# Patient Record
Sex: Male | Born: 1940
Health system: Southern US, Community
[De-identification: ages and names within clinical notes are randomized; demographics above are authoritative.]

## PROBLEM LIST (undated history)

## (undated) DIAGNOSIS — I4892 Unspecified atrial flutter: Secondary | ICD-10-CM

## (undated) DIAGNOSIS — I255 Ischemic cardiomyopathy: Secondary | ICD-10-CM

## (undated) DIAGNOSIS — I5042 Chronic combined systolic (congestive) and diastolic (congestive) heart failure: Secondary | ICD-10-CM

## (undated) DIAGNOSIS — I480 Paroxysmal atrial fibrillation: Secondary | ICD-10-CM

## (undated) DIAGNOSIS — I071 Rheumatic tricuspid insufficiency: Secondary | ICD-10-CM

## (undated) DIAGNOSIS — R0902 Hypoxemia: Secondary | ICD-10-CM

## (undated) DIAGNOSIS — I4891 Unspecified atrial fibrillation: Secondary | ICD-10-CM

## (undated) DIAGNOSIS — N183 Chronic kidney disease, stage 3 unspecified: Secondary | ICD-10-CM

## (undated) DIAGNOSIS — C7931 Secondary malignant neoplasm of brain: Secondary | ICD-10-CM

## (undated) DIAGNOSIS — I2510 Atherosclerotic heart disease of native coronary artery without angina pectoris: Secondary | ICD-10-CM

## (undated) DIAGNOSIS — R55 Syncope and collapse: Secondary | ICD-10-CM

## (undated) DIAGNOSIS — E785 Hyperlipidemia, unspecified: Secondary | ICD-10-CM

## (undated) DIAGNOSIS — I351 Nonrheumatic aortic (valve) insufficiency: Secondary | ICD-10-CM

## (undated) DIAGNOSIS — N529 Male erectile dysfunction, unspecified: Secondary | ICD-10-CM

## (undated) DIAGNOSIS — I10 Essential (primary) hypertension: Secondary | ICD-10-CM

## (undated) DIAGNOSIS — I219 Acute myocardial infarction, unspecified: Secondary | ICD-10-CM

## (undated) DIAGNOSIS — D649 Anemia, unspecified: Secondary | ICD-10-CM

## (undated) DIAGNOSIS — K635 Polyp of colon: Secondary | ICD-10-CM

## (undated) DIAGNOSIS — E871 Hypo-osmolality and hyponatremia: Secondary | ICD-10-CM

## (undated) DIAGNOSIS — Z9289 Personal history of other medical treatment: Secondary | ICD-10-CM

## (undated) DIAGNOSIS — I340 Nonrheumatic mitral (valve) insufficiency: Secondary | ICD-10-CM

## (undated) DIAGNOSIS — C3490 Malignant neoplasm of unspecified part of unspecified bronchus or lung: Secondary | ICD-10-CM

## (undated) DIAGNOSIS — N289 Disorder of kidney and ureter, unspecified: Secondary | ICD-10-CM

## (undated) HISTORY — DX: Personal history of other medical treatment: Z92.89

## (undated) HISTORY — DX: Paroxysmal atrial fibrillation: I48.0

## (undated) HISTORY — DX: Hypoxemia: R09.02

## (undated) HISTORY — DX: Hypo-osmolality and hyponatremia: E87.1

## (undated) HISTORY — DX: Male erectile dysfunction, unspecified: N52.9

## (undated) HISTORY — DX: Polyp of colon: K63.5

## (undated) HISTORY — PX: CORONARY STENT PLACEMENT: SHX1402

## (undated) HISTORY — DX: Essential (primary) hypertension: I10

## (undated) HISTORY — DX: Anemia, unspecified: D64.9

## (undated) HISTORY — DX: Secondary malignant neoplasm of brain: C79.31

## (undated) HISTORY — DX: Ischemic cardiomyopathy: I25.5

## (undated) HISTORY — DX: Nonrheumatic mitral (valve) insufficiency: I34.0

## (undated) HISTORY — PX: OTHER SURGICAL HISTORY: SHX169

## (undated) HISTORY — DX: Syncope and collapse: R55

## (undated) HISTORY — DX: Atherosclerotic heart disease of native coronary artery without angina pectoris: I25.10

## (undated) HISTORY — DX: Nonrheumatic aortic (valve) insufficiency: I35.1

## (undated) HISTORY — PX: WISDOM TOOTH EXTRACTION: SHX21

## (undated) HISTORY — DX: Unspecified atrial fibrillation: I48.91

## (undated) HISTORY — DX: Chronic combined systolic (congestive) and diastolic (congestive) heart failure: I50.42

## (undated) HISTORY — DX: Unspecified atrial flutter: I48.92

## (undated) HISTORY — DX: Hyperlipidemia, unspecified: E78.5

## (undated) HISTORY — DX: Rheumatic tricuspid insufficiency: I07.1

## (undated) HISTORY — DX: Chronic kidney disease, stage 3 unspecified: N18.30

## (undated) HISTORY — PX: COLONOSCOPY W/ POLYPECTOMY: SHX1380

---

## 2000-11-02 DIAGNOSIS — K635 Polyp of colon: Secondary | ICD-10-CM

## 2000-11-02 DIAGNOSIS — K621 Rectal polyp: Secondary | ICD-10-CM

## 2000-11-02 HISTORY — DX: Rectal polyp: K62.1

## 2000-11-02 HISTORY — PX: COLONOSCOPY W/ POLYPECTOMY: SHX1380

## 2000-11-02 HISTORY — DX: Polyp of colon: K63.5

## 2000-11-02 LAB — HM COLONOSCOPY

## 2001-07-12 ENCOUNTER — Other Ambulatory Visit: Admission: RE | Admit: 2001-07-12 | Discharge: 2001-07-12 | Payer: Self-pay | Admitting: Internal Medicine

## 2003-10-31 ENCOUNTER — Encounter: Admission: RE | Admit: 2003-10-31 | Discharge: 2003-10-31 | Payer: Self-pay | Admitting: Internal Medicine

## 2006-01-15 ENCOUNTER — Ambulatory Visit: Payer: Self-pay | Admitting: Internal Medicine

## 2006-01-19 ENCOUNTER — Ambulatory Visit: Payer: Self-pay | Admitting: Cardiology

## 2006-01-20 ENCOUNTER — Ambulatory Visit: Payer: Self-pay | Admitting: Internal Medicine

## 2006-01-20 ENCOUNTER — Inpatient Hospital Stay (HOSPITAL_BASED_OUTPATIENT_CLINIC_OR_DEPARTMENT_OTHER): Admission: RE | Admit: 2006-01-20 | Discharge: 2006-01-20 | Payer: Self-pay | Admitting: Internal Medicine

## 2006-01-20 ENCOUNTER — Ambulatory Visit: Payer: Self-pay | Admitting: Cardiology

## 2006-01-21 ENCOUNTER — Ambulatory Visit (HOSPITAL_COMMUNITY): Admission: RE | Admit: 2006-01-21 | Discharge: 2006-01-22 | Payer: Self-pay | Admitting: Cardiology

## 2006-01-21 ENCOUNTER — Ambulatory Visit: Payer: Self-pay | Admitting: Cardiology

## 2006-02-08 ENCOUNTER — Ambulatory Visit: Payer: Self-pay | Admitting: Cardiology

## 2006-02-10 ENCOUNTER — Ambulatory Visit: Payer: Self-pay | Admitting: Cardiology

## 2006-02-10 ENCOUNTER — Inpatient Hospital Stay (HOSPITAL_BASED_OUTPATIENT_CLINIC_OR_DEPARTMENT_OTHER): Admission: RE | Admit: 2006-02-10 | Discharge: 2006-02-10 | Payer: Self-pay | Admitting: Cardiology

## 2006-02-26 ENCOUNTER — Ambulatory Visit: Payer: Self-pay | Admitting: Cardiology

## 2006-03-26 ENCOUNTER — Ambulatory Visit: Payer: Self-pay | Admitting: Cardiology

## 2006-09-30 ENCOUNTER — Ambulatory Visit: Payer: Self-pay | Admitting: Cardiology

## 2006-12-16 ENCOUNTER — Ambulatory Visit: Payer: Self-pay | Admitting: Internal Medicine

## 2006-12-16 LAB — CONVERTED CEMR LAB
ALT: 23 units/L (ref 0–40)
AST: 20 units/L (ref 0–37)
Albumin: 4.1 g/dL (ref 3.5–5.2)
Alkaline Phosphatase: 59 units/L (ref 39–117)
Bilirubin, Direct: 0.1 mg/dL (ref 0.0–0.3)
Cholesterol: 184 mg/dL (ref 0–200)
HDL: 33.6 mg/dL — ABNORMAL LOW (ref >39.0)
LDL Cholesterol: 121 mg/dL — ABNORMAL HIGH (ref 0–99)
Total Bilirubin: 0.9 mg/dL (ref 0.3–1.2)
Total CHOL/HDL Ratio: 5.5
Total Protein: 7.1 g/dL (ref 6.0–8.3)
Triglycerides: 148 mg/dL (ref 0–149)
VLDL: 30 mg/dL (ref 0–40)

## 2007-01-07 ENCOUNTER — Ambulatory Visit: Payer: Self-pay | Admitting: Internal Medicine

## 2007-01-07 LAB — CONVERTED CEMR LAB
Creatinine,U: 66.8 mg/dL
Hgb A1c MFr Bld: 7.1 % — ABNORMAL HIGH (ref 4.6–6.0)
Microalb Creat Ratio: 4.5 mg/g (ref 0.0–30.0)
Microalb, Ur: 0.3 mg/dL (ref 0.0–1.9)

## 2007-01-24 ENCOUNTER — Encounter: Payer: Self-pay | Admitting: Internal Medicine

## 2007-02-03 ENCOUNTER — Encounter: Payer: Self-pay | Admitting: Internal Medicine

## 2007-02-03 ENCOUNTER — Ambulatory Visit: Payer: Self-pay | Admitting: Internal Medicine

## 2007-05-27 ENCOUNTER — Ambulatory Visit: Payer: Self-pay | Admitting: Internal Medicine

## 2007-05-27 LAB — CONVERTED CEMR LAB
ALT: 20 units/L (ref 0–53)
AST: 20 units/L (ref 0–37)
Cholesterol: 141 mg/dL (ref 0–200)
Creatinine,U: 89.7 mg/dL
HDL: 24.7 mg/dL — ABNORMAL LOW (ref >39.0)
Hgb A1c MFr Bld: 6.8 % — ABNORMAL HIGH (ref 4.6–6.0)
LDL Cholesterol: 83 mg/dL (ref 0–99)
Microalb Creat Ratio: 2.2 mg/g (ref 0.0–30.0)
Microalb, Ur: 0.2 mg/dL (ref 0.0–1.9)
Total CHOL/HDL Ratio: 5.7
Triglycerides: 168 mg/dL — ABNORMAL HIGH (ref 0–149)
VLDL: 34 mg/dL (ref 0–40)

## 2007-06-03 ENCOUNTER — Ambulatory Visit: Payer: Self-pay | Admitting: Internal Medicine

## 2007-06-03 LAB — CONVERTED CEMR LAB
Cholesterol, target level: 200 mg/dL
HDL goal, serum: 40 mg/dL
LDL Goal: 100 mg/dL

## 2007-07-16 ENCOUNTER — Emergency Department (HOSPITAL_COMMUNITY): Admission: EM | Admit: 2007-07-16 | Discharge: 2007-07-17 | Payer: Self-pay | Admitting: Emergency Medicine

## 2007-09-23 ENCOUNTER — Ambulatory Visit: Payer: Self-pay | Admitting: Internal Medicine

## 2007-09-24 LAB — CONVERTED CEMR LAB: Hgb A1c MFr Bld: 6.4 % — ABNORMAL HIGH (ref 4.6–6.0)

## 2007-09-26 ENCOUNTER — Encounter (INDEPENDENT_AMBULATORY_CARE_PROVIDER_SITE_OTHER): Payer: Self-pay | Admitting: *Deleted

## 2007-10-07 ENCOUNTER — Ambulatory Visit: Payer: Self-pay | Admitting: Internal Medicine

## 2007-10-07 DIAGNOSIS — F528 Other sexual dysfunction not due to a substance or known physiological condition: Secondary | ICD-10-CM | POA: Insufficient documentation

## 2007-10-07 DIAGNOSIS — E785 Hyperlipidemia, unspecified: Secondary | ICD-10-CM | POA: Insufficient documentation

## 2008-02-28 ENCOUNTER — Telehealth (INDEPENDENT_AMBULATORY_CARE_PROVIDER_SITE_OTHER): Payer: Self-pay | Admitting: *Deleted

## 2008-04-19 ENCOUNTER — Ambulatory Visit: Payer: Self-pay | Admitting: Internal Medicine

## 2008-04-19 LAB — CONVERTED CEMR LAB
ALT: 17 units/L (ref 0–53)
AST: 19 units/L (ref 0–37)
Albumin: 4 g/dL (ref 3.5–5.2)
Alkaline Phosphatase: 53 units/L (ref 39–117)
Bilirubin, Direct: 0.1 mg/dL (ref 0.0–0.3)
Cholesterol: 151 mg/dL (ref 0–200)
Creatinine,U: 83.2 mg/dL
HDL: 29.1 mg/dL — ABNORMAL LOW (ref >39.0)
Hgb A1c MFr Bld: 6.2 % — ABNORMAL HIGH (ref 4.6–6.0)
LDL Cholesterol: 91 mg/dL (ref 0–99)
Microalb Creat Ratio: 2.4 mg/g (ref 0.0–30.0)
Microalb, Ur: 0.2 mg/dL (ref 0.0–1.9)
Total Bilirubin: 0.8 mg/dL (ref 0.3–1.2)
Total CHOL/HDL Ratio: 5.2
Total Protein: 6.9 g/dL (ref 6.0–8.3)
Triglycerides: 154 mg/dL — ABNORMAL HIGH (ref 0–149)
VLDL: 31 mg/dL (ref 0–40)

## 2008-04-26 ENCOUNTER — Ambulatory Visit: Payer: Self-pay | Admitting: Internal Medicine

## 2008-04-26 DIAGNOSIS — D126 Benign neoplasm of colon, unspecified: Secondary | ICD-10-CM | POA: Insufficient documentation

## 2008-04-26 LAB — CONVERTED CEMR LAB: LDL Goal: 70 mg/dL

## 2008-05-03 ENCOUNTER — Encounter (INDEPENDENT_AMBULATORY_CARE_PROVIDER_SITE_OTHER): Payer: Self-pay | Admitting: *Deleted

## 2008-05-03 ENCOUNTER — Ambulatory Visit: Payer: Self-pay | Admitting: Internal Medicine

## 2008-05-03 LAB — CONVERTED CEMR LAB
OCCULT 1: NEGATIVE
OCCULT 2: NEGATIVE
OCCULT 3: NEGATIVE

## 2008-06-11 ENCOUNTER — Telehealth (INDEPENDENT_AMBULATORY_CARE_PROVIDER_SITE_OTHER): Payer: Self-pay | Admitting: *Deleted

## 2008-07-16 ENCOUNTER — Telehealth (INDEPENDENT_AMBULATORY_CARE_PROVIDER_SITE_OTHER): Payer: Self-pay | Admitting: *Deleted

## 2008-07-18 ENCOUNTER — Telehealth (INDEPENDENT_AMBULATORY_CARE_PROVIDER_SITE_OTHER): Payer: Self-pay | Admitting: *Deleted

## 2008-10-15 ENCOUNTER — Telehealth (INDEPENDENT_AMBULATORY_CARE_PROVIDER_SITE_OTHER): Payer: Self-pay | Admitting: *Deleted

## 2008-10-24 ENCOUNTER — Encounter (INDEPENDENT_AMBULATORY_CARE_PROVIDER_SITE_OTHER): Payer: Self-pay | Admitting: *Deleted

## 2008-11-19 ENCOUNTER — Telehealth (INDEPENDENT_AMBULATORY_CARE_PROVIDER_SITE_OTHER): Payer: Self-pay | Admitting: *Deleted

## 2008-11-26 ENCOUNTER — Ambulatory Visit: Payer: Self-pay | Admitting: Internal Medicine

## 2008-12-02 LAB — CONVERTED CEMR LAB
ALT: 21 units/L (ref 0–53)
AST: 21 units/L (ref 0–37)
Albumin: 4.2 g/dL (ref 3.5–5.2)
Alkaline Phosphatase: 54 units/L (ref 39–117)
BUN: 17 mg/dL (ref 6–23)
Basophils Absolute: 0 10*3/uL (ref 0.0–0.1)
Basophils Relative: 0.5 % (ref 0.0–3.0)
Bilirubin, Direct: 0.1 mg/dL (ref 0.0–0.3)
Cholesterol: 144 mg/dL (ref 0–200)
Creatinine, Ser: 1.1 mg/dL (ref 0.4–1.5)
Direct LDL: 69.3 mg/dL
Eosinophils Absolute: 0.4 10*3/uL (ref 0.0–0.7)
Eosinophils Relative: 4.6 % (ref 0.0–5.0)
HCT: 37.9 % — ABNORMAL LOW (ref 39.0–52.0)
HDL: 28.3 mg/dL — ABNORMAL LOW (ref >39.0)
Hemoglobin: 13.4 g/dL (ref 13.0–17.0)
Lymphocytes Relative: 20.7 % (ref 12.0–46.0)
MCHC: 35.4 g/dL (ref 30.0–36.0)
MCV: 87.4 fL (ref 78.0–100.0)
Monocytes Absolute: 0.9 10*3/uL (ref 0.1–1.0)
Monocytes Relative: 9.7 % (ref 3.0–12.0)
Neutro Abs: 6.2 10*3/uL (ref 1.4–7.7)
Neutrophils Relative %: 64.5 % (ref 43.0–77.0)
PSA: 1.45 ng/mL (ref 0.10–4.00)
Platelets: 264 10*3/uL (ref 150–400)
Potassium: 3.9 meq/L (ref 3.5–5.1)
RBC: 4.34 M/uL (ref 4.22–5.81)
RDW: 12 % (ref 11.5–14.6)
Total Bilirubin: 0.6 mg/dL (ref 0.3–1.2)
Total CHOL/HDL Ratio: 5.1
Total Protein: 7.2 g/dL (ref 6.0–8.3)
Triglycerides: 265 mg/dL (ref 0–149)
VLDL: 53 mg/dL — ABNORMAL HIGH (ref 0–40)
WBC: 9.5 10*3/uL (ref 4.5–10.5)

## 2008-12-04 ENCOUNTER — Ambulatory Visit: Payer: Self-pay | Admitting: Internal Medicine

## 2008-12-04 DIAGNOSIS — D649 Anemia, unspecified: Secondary | ICD-10-CM | POA: Insufficient documentation

## 2008-12-06 ENCOUNTER — Encounter: Payer: Self-pay | Admitting: Internal Medicine

## 2008-12-06 LAB — CONVERTED CEMR LAB
Basophils Absolute: 0.1 10*3/uL (ref 0.0–0.1)
Basophils Relative: 0.6 % (ref 0.0–3.0)
Creatinine,U: 52.1 mg/dL
Eosinophils Absolute: 0.3 10*3/uL (ref 0.0–0.7)
Eosinophils Relative: 4.1 % (ref 0.0–5.0)
Folate: >20 ng/mL
HCT: 40.9 % (ref 39.0–52.0)
Hemoglobin: 13.8 g/dL (ref 13.0–17.0)
Hgb A1c MFr Bld: 6.7 % — ABNORMAL HIGH (ref 4.6–6.0)
Iron: 86 ug/dL (ref 42–165)
Lymphocytes Relative: 18.5 % (ref 12.0–46.0)
MCHC: 33.8 g/dL (ref 30.0–36.0)
MCV: 89.8 fL (ref 78.0–100.0)
Microalb Creat Ratio: 3.8 mg/g (ref 0.0–30.0)
Microalb, Ur: 0.2 mg/dL (ref 0.0–1.9)
Monocytes Absolute: 0.8 10*3/uL (ref 0.1–1.0)
Monocytes Relative: 9.2 % (ref 3.0–12.0)
Neutro Abs: 5.6 10*3/uL (ref 1.4–7.7)
Neutrophils Relative %: 67.6 % (ref 43.0–77.0)
Platelets: 285 10*3/uL (ref 150–400)
RBC: 4.56 M/uL (ref 4.22–5.81)
RDW: 12.2 % (ref 11.5–14.6)
Saturation Ratios: 30.1 % (ref 20.0–50.0)
Transferrin: 203.8 mg/dL — ABNORMAL LOW (ref >=212.0)
Vitamin B-12: 494 pg/mL (ref 211–911)
WBC: 8.4 10*3/uL (ref 4.5–10.5)

## 2008-12-13 ENCOUNTER — Encounter (INDEPENDENT_AMBULATORY_CARE_PROVIDER_SITE_OTHER): Payer: Self-pay | Admitting: *Deleted

## 2008-12-13 ENCOUNTER — Ambulatory Visit: Payer: Self-pay | Admitting: Internal Medicine

## 2008-12-13 LAB — CONVERTED CEMR LAB
OCCULT 1: NEGATIVE
OCCULT 2: NEGATIVE
OCCULT 3: NEGATIVE

## 2009-01-15 ENCOUNTER — Telehealth (INDEPENDENT_AMBULATORY_CARE_PROVIDER_SITE_OTHER): Payer: Self-pay | Admitting: *Deleted

## 2009-04-05 ENCOUNTER — Ambulatory Visit: Payer: Self-pay | Admitting: Internal Medicine

## 2009-04-13 LAB — CONVERTED CEMR LAB
ALT: 23 units/L (ref 0–53)
AST: 25 units/L (ref 0–37)
Albumin: 4.3 g/dL (ref 3.5–5.2)
Alkaline Phosphatase: 49 units/L (ref 39–117)
Bilirubin, Direct: 0.1 mg/dL (ref 0.0–0.3)
Cholesterol: 142 mg/dL (ref 0–200)
HDL: 31 mg/dL — ABNORMAL LOW (ref >39.00)
Hgb A1c MFr Bld: 6.5 % (ref 4.6–6.5)
LDL Cholesterol: 88 mg/dL (ref 0–99)
Total Bilirubin: 0.8 mg/dL (ref 0.3–1.2)
Total CHOL/HDL Ratio: 5
Total Protein: 7.1 g/dL (ref 6.0–8.3)
Triglycerides: 114 mg/dL (ref 0.0–149.0)
VLDL: 22.8 mg/dL (ref 0.0–40.0)

## 2009-04-15 ENCOUNTER — Encounter (INDEPENDENT_AMBULATORY_CARE_PROVIDER_SITE_OTHER): Payer: Self-pay | Admitting: *Deleted

## 2009-05-03 ENCOUNTER — Ambulatory Visit: Payer: Self-pay | Admitting: Internal Medicine

## 2009-08-28 ENCOUNTER — Encounter: Payer: Self-pay | Admitting: Internal Medicine

## 2009-10-16 ENCOUNTER — Ambulatory Visit: Payer: Self-pay | Admitting: Internal Medicine

## 2009-10-17 LAB — CONVERTED CEMR LAB: Hgb A1c MFr Bld: 7.3 % — ABNORMAL HIGH (ref 4.6–6.5)

## 2009-10-18 ENCOUNTER — Encounter (INDEPENDENT_AMBULATORY_CARE_PROVIDER_SITE_OTHER): Payer: Self-pay | Admitting: *Deleted

## 2009-10-23 ENCOUNTER — Ambulatory Visit: Payer: Self-pay | Admitting: Internal Medicine

## 2009-12-13 ENCOUNTER — Telehealth (INDEPENDENT_AMBULATORY_CARE_PROVIDER_SITE_OTHER): Payer: Self-pay | Admitting: *Deleted

## 2009-12-17 ENCOUNTER — Telehealth (INDEPENDENT_AMBULATORY_CARE_PROVIDER_SITE_OTHER): Payer: Self-pay | Admitting: *Deleted

## 2010-02-13 ENCOUNTER — Ambulatory Visit: Payer: Self-pay | Admitting: Internal Medicine

## 2010-02-17 LAB — CONVERTED CEMR LAB
ALT: 23 units/L (ref 0–53)
AST: 23 units/L (ref 0–37)
Albumin: 4.2 g/dL (ref 3.5–5.2)
Alkaline Phosphatase: 44 units/L (ref 39–117)
BUN: 20 mg/dL (ref 6–23)
Bilirubin, Direct: 0.1 mg/dL (ref 0.0–0.3)
Cholesterol: 150 mg/dL (ref 0–200)
Creatinine, Ser: 1.2 mg/dL (ref 0.4–1.5)
Creatinine,U: 51.6 mg/dL
HDL: 34.2 mg/dL — ABNORMAL LOW (ref >39.00)
Hgb A1c MFr Bld: 6.3 % (ref 4.6–6.5)
LDL Cholesterol: 91 mg/dL (ref 0–99)
Microalb Creat Ratio: 13.6 mg/g (ref 0.0–30.0)
Microalb, Ur: 0.7 mg/dL (ref 0.0–1.9)
Potassium: 4.5 meq/L (ref 3.5–5.1)
Total Bilirubin: 0.5 mg/dL (ref 0.3–1.2)
Total CHOL/HDL Ratio: 4
Total Protein: 7 g/dL (ref 6.0–8.3)
Triglycerides: 123 mg/dL (ref 0.0–149.0)
VLDL: 24.6 mg/dL (ref 0.0–40.0)

## 2010-02-20 ENCOUNTER — Ambulatory Visit: Payer: Self-pay | Admitting: Internal Medicine

## 2010-03-01 ENCOUNTER — Ambulatory Visit: Payer: Self-pay | Admitting: Internal Medicine

## 2010-03-01 ENCOUNTER — Inpatient Hospital Stay (HOSPITAL_COMMUNITY): Admission: EM | Admit: 2010-03-01 | Discharge: 2010-03-04 | Payer: Self-pay | Admitting: Emergency Medicine

## 2010-03-28 DIAGNOSIS — I10 Essential (primary) hypertension: Secondary | ICD-10-CM | POA: Insufficient documentation

## 2010-04-01 ENCOUNTER — Ambulatory Visit: Payer: Self-pay | Admitting: Cardiovascular Disease

## 2010-06-12 ENCOUNTER — Telehealth (INDEPENDENT_AMBULATORY_CARE_PROVIDER_SITE_OTHER): Payer: Self-pay | Admitting: *Deleted

## 2010-09-11 ENCOUNTER — Ambulatory Visit: Payer: Self-pay | Admitting: Internal Medicine

## 2010-09-15 LAB — CONVERTED CEMR LAB
Creatinine,U: 26.1 mg/dL
Hgb A1c MFr Bld: 7.2 % — ABNORMAL HIGH (ref 4.6–6.5)
Microalb Creat Ratio: 1.9 mg/g (ref 0.0–30.0)
Microalb, Ur: 0.5 mg/dL (ref 0.0–1.9)

## 2010-09-17 ENCOUNTER — Telehealth: Payer: Self-pay | Admitting: Cardiovascular Disease

## 2010-09-18 ENCOUNTER — Ambulatory Visit: Payer: Self-pay | Admitting: Internal Medicine

## 2010-09-18 DIAGNOSIS — E1159 Type 2 diabetes mellitus with other circulatory complications: Secondary | ICD-10-CM | POA: Insufficient documentation

## 2010-10-16 ENCOUNTER — Ambulatory Visit: Payer: Self-pay | Admitting: Cardiovascular Disease

## 2010-10-16 DIAGNOSIS — M79609 Pain in unspecified limb: Secondary | ICD-10-CM | POA: Insufficient documentation

## 2010-11-22 ENCOUNTER — Encounter: Payer: Self-pay | Admitting: Internal Medicine

## 2010-11-25 ENCOUNTER — Telehealth (INDEPENDENT_AMBULATORY_CARE_PROVIDER_SITE_OTHER): Payer: Self-pay | Admitting: *Deleted

## 2010-12-02 NOTE — Progress Notes (Signed)
Summary: refill  Phone Note Refill Request Message from:  Fax from Pharmacy on [facility] fax [phone]  Refills Requested: Medication #1:  SIMVASTATIN 40 MG TABS 1 at bedtime. Initial call taken by: Barb Merino,  December 13, 2009 9:33 AM    Prescriptions: SIMVASTATIN 40 MG TABS (SIMVASTATIN) 1 at bedtime  #90 x 1   Entered by:   Shonna Chock   Authorized by:   [name] MD   Signed by:   Shonna Chock on 12/13/2009   Method used:   Electronically to        [facility] (retail)       [address]       [place], [place]  [address]       Ph: [phone]       Fax: [phone]   RxID:   [id number]

## 2010-12-02 NOTE — Progress Notes (Signed)
Summary: refill  Phone Note Refill Request Message from:  Fax from Pharmacy on June 12, 2010 11:42 AM  Refills Requested: Medication #1:  SIMVASTATIN 40 MG TABS 1 at bedtime [facility] - fax [phone]  Initial call taken by: Okey Regal [place],  June 12, 2010 11:44 AM    Prescriptions: SIMVASTATIN 40 MG TABS (SIMVASTATIN) 1 at bedtime  #90 x 2   Entered by:   Shonna Chock CMA   Authorized by:   [name] MD   Signed by:   Shonna Chock CMA on 06/12/2010   Method used:   Electronically to        [facility] (retail)       [address]       [place], [place]  [address]       Ph: [phone]       Fax: [phone]   RxID:   [id number]

## 2010-12-02 NOTE — Assessment & Plan Note (Signed)
Summary: 6 month roa//lch   Vital Signs:  Patient profile:   70 year old male Weight:      196 pounds BMI:     27.44 Pulse rate:   72 / minute Resp:     14 per minute BP sitting:   118 / 78  (left arm) Cuff size:   large  Vitals Entered By: Shonna Chock CMA (September 18, 2010 8:00 AM) CC: 6 month follow-up (copy of labs given), Type 2 diabetes mellitus follow-up   Primary Care Provider:  [name] MD  CC:  6 month follow-up (copy of labs given) and Type 2 diabetes mellitus follow-up.  History of Present Illness: Type 2 Diabetes Mellitus Follow-Up      This is a 70 year old man who presents for Type 2 diabetes mellitus follow-up.  The patient reports rare self managed hypoglycemia and weight gain of 10#, but denies polyuria, polydipsia, blurred vision, and numbness of extremities.  The patient denies the following symptoms: neuropathic pain, chest pain, vomiting, orthostatic symptoms, poor wound healing, intermittent claudication, vision loss, and foot ulcer.  Since the last visit the patient reports poor dietary compliance and exercising regularly as treadmill X 15 min daily.  The patient has been measuring capillary blood glucose before breakfast once a week, average 135.  Since the last visit, the patient reports having had eye care by an ophthalmologist, no retinopathy. MI in 03/2010,that D/C Summary was reviewed as well as F/[facility] Cardiology appt.  Plavix ( costs $ 40 / month) & ASA for@ least  12 months.A1c up from 6.3% with increase in average suger from 134 to 160 & increased risk from 26 to 44%.    Current Medications (verified): 1)  Metoprolol Tartrate 25 Mg Tabs (Metoprolol Tartrate) .[name] Kitchen.. 1 Tab Two Times A Day 2)  Fexofenadine Hcl 180 Mg  Tabs (Fexofenadine Hcl) .[name] Kitchen.. 1 By Mouth Qd 3)  Multivitamins   Tabs (Multiple Vitamin) .[name] Kitchen.. 1 Tab Once Daily 4)  Calcium 500 Mg Tabs (Calcium) .[name] Kitchen.. 1 Tab Once Daily 5)  Vitamin C 500 Mg Tabs (Ascorbic Acid) .[name] Kitchen.. 1 Tab Once Daily 6)   Vitamin E 400 Unit Caps (Vitamin E) .[name] Kitchen.. 1 Cap Once Daily 7)  Fish Oil 1000 Mg Caps (Omega-3 Fatty Acids) .[name] Kitchen.. 1 Cap Once Daily 8)  Metformin Hcl 500 Mg  Tabs (Metformin Hcl) .[name] Kitchen.. 1 Tab Once Daily 9)  Plavix 75 Mg  Tabs (Clopidogrel Bisulfate) .[name] Kitchen.. 1 By Mouth Qd 10)  Levitra 20 Mg  Tabs (Vardenafil Hcl) .... 1/2 -1 As Needed , Not With Nitroglycerin 11)  Nitroquick 0.4 Mg  Subl (Nitroglycerin) .... Place Under Tongue As Needed For Chest Pain May Repeat Every 5 Minutes X3 12)  Simvastatin 40 Mg Tabs (Simvastatin) .[name] Kitchen.. 1 At Bedtime 13)  Aspirin Ec 325 Mg Tbec (Aspirin) .... Take One Tablet By Mouth Daily 14)  Onetouch Ultra Blue  Strp (Glucose Blood) .... Check Blood Sugar 1 X Daily 15)  Onetouch Delica Lancets  Misc (Lancets) .... Check Blood Sugar 1 X Daily  Allergies (verified): No Known Drug Allergies  Physical Exam  General:  well-nourished;alert,appropriate and cooperative throughout examination Lungs:  Normal respiratory effort, chest expands symmetrically. Lungs are clear to auscultation, no crackles or wheezes. Heart:  Normal rate and regular rhythm. S1 and S2 normal without gallop, murmur, click, rub.S4 Pulses:  R and L carotid,radial,dorsalis pedis and posterior tibial pulses are full and equal bilaterally Extremities:  No clubbing, cyanosis, edema. Good nail health Neurologic:  alert &  oriented X3 and sensation intact to light touch over feet.   Skin:  Intact without suspicious lesions or rashes Psych:  memory intact for recent and remote, normally interactive, and good eye contact.     Impression & Recommendations:  Problem # 1:  DIABETES MELLITUS (ICD-250.00) A1c up from 6.3% to 7.2%, risks discussed His updated medication list for this problem includes:    Metformin Hcl 500 Mg Tabs (Metformin hcl) .[name] Kitchen... 1 tab  two times a day with meals    Aspirin Ec 325 Mg Tbec (Aspirin) .[name] Kitchen... Take one tablet by mouth daily  Problem # 2:  CAD, NATIVE VESSEL (ICD-414.01) S/P MI in  03/2010 His updated medication list for this problem includes:    Metoprolol Tartrate 25 Mg Tabs (Metoprolol tartrate) .[name] Kitchen... 1 tab two times a day    Plavix 75 Mg Tabs (Clopidogrel bisulfate) .[name] Kitchen... 1 by mouth qd    Nitroquick 0.4 Mg Subl (Nitroglycerin) .[name] Kitchen... Place under tongue as needed for chest pain may repeat every 5 minutes x3    Aspirin Ec 325 Mg Tbec (Aspirin) .[name] Kitchen... Take one tablet by mouth daily  Problem # 3:  HYPERTENSION (ICD-401.9) controlled His updated medication list for this problem includes:    Metoprolol Tartrate 25 Mg Tabs (Metoprolol tartrate) .[name] Kitchen... 1 tab two times a day  Problem # 4:  HYPERLIPIDEMIA (ICD-272.4) LDL 91 in 01/2010; goal = < 70 His updated medication list for this problem includes:    Simvastatin 40 Mg Tabs (Simvastatin) .[name] Kitchen... 1 at bedtime  Complete Medication List: 1)  Metoprolol Tartrate 25 Mg Tabs (Metoprolol tartrate) .[name] Kitchen.. 1 tab two times a day 2)  Fexofenadine Hcl 180 Mg Tabs (Fexofenadine hcl) .[name] Kitchen.. 1 by mouth qd 3)  Multivitamins Tabs (Multiple vitamin) .[name] Kitchen.. 1 tab once daily 4)  Calcium 500 Mg Tabs (Calcium) .[name] Kitchen.. 1 tab once daily 5)  Vitamin C 500 Mg Tabs (Ascorbic acid) .[name] Kitchen.. 1 tab once daily 6)  Vitamin E 400 Unit Caps (Vitamin e) .[name] Kitchen.. 1 cap once daily 7)  Fish Oil 1000 Mg Caps (Omega-3 fatty acids) .[name] Kitchen.. 1 cap once daily 8)  Metformin Hcl 500 Mg Tabs (Metformin hcl) .[name] Kitchen.. 1 tab  two times a day with meals 9)  Plavix 75 Mg Tabs (Clopidogrel bisulfate) .[name] Kitchen.. 1 by mouth qd 10)  Levitra 20 Mg Tabs (Vardenafil hcl) .... 1/2 -1 as needed , not with nitroglycerin 11)  Nitroquick 0.4 Mg Subl (Nitroglycerin) .... Place under tongue as needed for chest pain may repeat every 5 minutes x3 12)  Simvastatin 40 Mg Tabs (Simvastatin) .[name] Kitchen.. 1 at bedtime 13)  Aspirin Ec 325 Mg Tbec (Aspirin) .... Take one tablet by mouth daily 14)  Onetouch Ultra Blue Strp (Glucose blood) .... Check blood sugar 1 x daily 15)  Onetouch Delica Lancets Misc (Lancets) .... Check blood  sugar 1 x daily  Patient Instructions: 1)  Ask Cardiologist  about Plavix from [place] . Avoid High Fructose Corn Syrup sugar. 2)  Please schedule a follow-up appointment in 3 months. 3)  Hepatic Panel prior to visit, ICD-9:995.20 4)  Lipid Panel prior to visit, ICD-9:272.4 5)  HbgA1C prior to visit, ICD-9:250.02 6)  Urine Microalbumin prior to visit, ICD-9:250.02 Prescriptions: SIMVASTATIN 40 MG TABS (SIMVASTATIN) 1 at bedtime  #90 x 1   Entered and Authorized by:   [name] MD   Signed by:   [name] MD on 09/18/2010   Method used:   Print then Give to Patient   RxID:   [id number]  METFORMIN HCL 500 MG  TABS (METFORMIN HCL) 1 tab  two times a day with meals  #180 x 1   Entered and Authorized by:   [name] MD   Signed by:   [name] MD on 09/18/2010   Method used:   Print then Give to Patient   RxID:   [id number]    Orders Added: 1)  Est. Patient Level IV [[address]782]

## 2010-12-02 NOTE — Assessment & Plan Note (Signed)
Summary: 4 MONTH FOLLOWUP TO DISCUSS LABS///SPH   Vital Signs:  Patient profile:   70 year old male Weight:      189.2 pounds Pulse rate:   72 / minute Resp:     15 per minute BP sitting:   128 / 70  (left arm) Cuff size:   large  Vitals Entered By: Shonna Chock (February 20, 2010 8:03 AM) CC: 4 Month follow-up and discuss labs (Copy given) Comments REVIEWED MED LIST, PATIENT AGREED DOSE AND INSTRUCTION CORRECT    CC:  4 Month follow-up and discuss labs (Copy given).  History of Present Illness: Labs reviewed & risks discussed. A1c has decreased from 7.3% to 6.3%; LDL 91. "I quit eating  crap";alking 1 mpd. Waist down 2 inches. FBS 120 on average; no hypoglycemia. No post meal glucoses  checked.  Allergies (verified): No Known Drug Allergies  Review of Systems General:  Denies fatigue. Eyes:  No retinopathy 2 weeks ago. CV:  Denies chest pain or discomfort, lightheadness, and near fainting. GI:  Denies abdominal pain, bloody stools, and dark tarry stools; Colonoscopy up to date. 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 Derm:  Denies poor wound healing. Neuro:  Denies numbness and tingling. Endo:  Denies excessive hunger, excessive thirst, and excessive urination.  Physical Exam  General:  well-nourished; alert,appropriate and cooperative throughout examination Lungs:  Normal respiratory effort, chest expands symmetrically. Lungs are clear to auscultation, no crackles or wheezes. Decreased BS w/o increased WOB Heart:  Normal rate and regular rhythm. S1 and S2 normal without gallop, murmur, click, rub or other extra sounds. Abdomen:  Bowel sounds positive,abdomen soft and non-tender without masses, organomegaly or hernias noted. Pulses:  R and L carotid,radial,dorsalis pedis and posterior tibial pulses are full and equal bilaterally Extremities:  No clubbing, cyanosis, edema, or deformity noted . Good nail health Neurologic:   alert & oriented X3 and sensation intact to light touch over feet.   Skin:  Intact without suspicious lesions or rashes Psych:  memory intact for recent and remote. Focused & motivated    Impression & Recommendations:  Problem # 1:  DIABETES MELLITUS, CONTROLLED (ICD-250.00)  Dramatic risk reduction His updated medication list for this problem includes:    Metformin Hcl 500 Mg Tabs (Metformin hcl) .[name] Kitchen... 1 twice  daily with largest meals  Orders: Prescription Created Electronically [id number])  Problem # 2:  HYPERLIPIDEMIA (ICD-272.4)  Lipids @ goal, but LDL ideal = < 70 His updated medication list for this problem includes:    Simvastatin 40 Mg Tabs (Simvastatin) .[name] Kitchen... 1 at bedtime  Orders: Prescription Created Electronically [phone])  Problem # 3:  ATHEROSCLEROSIS, CORONARY, NATIVE ARTERY (ICD-414.01)  stable His updated medication list for this problem includes:    Metoprolol Succinate 25 Mg Tb24 (Metoprolol succinate) .[name] Kitchen... 1 by mouth bid    Plavix 75 Mg Tabs (Clopidogrel bisulfate) .[name] Kitchen... 1 by mouth qd    Nitroquick 0.4 Mg Subl (Nitroglycerin) .[name] Kitchen... Place under tongue as needed for chest pain may repeat every 5 minutes x3  Orders: Prescription Created Electronically [phone])  Complete Medication List: 1)  Metoprolol Succinate 25 Mg Tb24 (Metoprolol succinate) .[name] Kitchen.. 1 by mouth bid 2)  Fexofenadine Hcl 180 Mg Tabs (Fexofenadine hcl) .[name] Kitchen.. 1 by mouth qd 3)  Mvi  4)  Calcium  5)  Vitamin C  6)  Vitamin E  7)  Fish Oil  8)  Metformin Hcl 500 Mg Tabs (Metformin hcl) .[name] Kitchen.. 1 twice  daily with largest meals 9)  Plavix 75 Mg Tabs (Clopidogrel bisulfate) .[name] Kitchen.. 1 by mouth qd 10)  Levitra 20 Mg Tabs (Vardenafil hcl) .... 1/2 -1 as needed , not with nitroglycerin 11)  Nitroquick 0.4 Mg Subl (Nitroglycerin) .... Place under tongue as needed for chest pain may repeat every 5 minutes x3 12)  Simvastatin 40 Mg Tabs (Simvastatin) .[name] Kitchen.. 1 at bedtime  Patient Instructions: 1)  Consume < 40  grams of High Fructose Corn Syrup sugar/ day. 2)  Check your blood sugars regularly. If your readings are usually above :150 or below 90  OR  sugar 2 hours  after meal > 180(ideally < 160)you should contact our office. 3)  See your eye doctor yearly to check for diabetic eye damage. 4)  Check your feet each night for sore areas, calluses or signs of infection. 5)  Check your Blood Pressure regularly. If it is above: 135/85 ON AVERAGE  you should make an appointment. NTG can't be taken with Levitra. 6)  Please schedule a follow-up appointment in 6 months. 7)  HbgA1C prior to visit, ICD-9: 8)  Urine Microalbumin prior to visit, ICD-9: Prescriptions: SIMVASTATIN 40 MG TABS (SIMVASTATIN) 1 at bedtime  #90 x 3   Entered and Authorized by:   [name] MD   Signed by:   [name] MD on 02/20/2010   Method used:   Faxed to ...       [facility] (retail)       [address]       [place], [place]  [address]       Ph: [phone]       Fax: [phone]   RxID:   [id number] NITROQUICK 0.4 MG  SUBL (NITROGLYCERIN) Place under tongue as needed for chest pain may repeat every 5 minutes x3  #30 x 3   Entered and Authorized by:   [name] MD   Signed by:   [name] MD on 02/20/2010   Method used:   Faxed to ...       [facility] (retail)       [address]       [place], [place]  [address]       Ph: [phone]       Fax: [phone]   RxID:   [id number] PLAVIX 75 MG  TABS (CLOPIDOGREL BISULFATE) 1 by mouth qd  #30 x 11   Entered and Authorized by:   [name] MD   Signed by:   [name] MD on 02/20/2010   Method used:   Faxed to ...       [facility] (retail)       [address]       [place], [place]  [address]       Ph: [phone]       Fax: [phone]   RxID:   [id number] METFORMIN HCL 500 MG  TABS (METFORMIN HCL) 1 TWICE  daily WITH LARGEST MEALS  #180 x 1    Entered and Authorized by:   [name] MD   Signed by:   [name] MD on 02/20/2010   Method used:   Faxed to ...       [facility]* (retail)       [address]       [place]  [place], [place]  [address]       Ph: [phone]       Fax: [phone]   RxID:   [id number] METOPROLOL SUCCINATE 25 MG  TB24 (METOPROLOL SUCCINATE) 1 by mouth bid  #180 x 3   Entered and Authorized by:   [name] MD   Signed by:   [name] MD on 02/20/2010   Method used:   Faxed to ...       [facility] (retail)       [address]       [place], [place]  [address]       Ph: [phone]       Fax: [phone]   RxID:   [id number]   Appended Document: 4 MONTH FOLLOWUP TO DISCUSS LABS///SPH 35 min OV

## 2010-12-02 NOTE — Progress Notes (Signed)
Summary: Refill Request  Phone Note Refill Request Message from:  Pharmacy on [facility] Fax #: [phone]  Refills Requested: Medication #1:  FEXOFENADINE HCL 180 MG  TABS 1 by mouth qd   Dosage confirmed as above?Dosage Confirmed   Supply Requested: 3 months Initial call taken by: Harold Barban,  December 17, 2009 9:05 AM    Prescriptions: FEXOFENADINE HCL 180 MG  TABS (FEXOFENADINE HCL) 1 by mouth qd  #90 x 2   Entered by:   Shonna Chock   Authorized by:   [name] MD   Signed by:   Shonna Chock on 12/17/2009   Method used:   Electronically to        [facility] (retail)       [address]       [place], [place]  [address]       Ph: [phone]       Fax: [phone]   RxID:   [id number]

## 2010-12-02 NOTE — Assessment & Plan Note (Signed)
Summary: EPH   Visit Type:  EPH Primary Provider:  [name] MD  CC:  no cardiac complaints today.  History of Present Illness: 70 yo WM with history of DM, HTN, hyperlipideima and CAD with recent admission to [facility] 03/01/10 with NSTEMI. Cardiac cath on 03/03/10 (outlined in detail below) with severe stenosis distal RCA and patent stent mid RCA. I placed a drug eluting stent in the area of severe stenosis in the distal RCA. He has done well since discharge. No chest pain, SOB, palpitations, near syncope or syncope. He has been tolerating all of his medications.   Current Medications (verified): 1)  Metoprolol Tartrate 25 Mg Tabs (Metoprolol Tartrate) .[name] Kitchen.. 1 Tab Two Times A Day 2)  Fexofenadine Hcl 180 Mg  Tabs (Fexofenadine Hcl) .[name] Kitchen.. 1 By Mouth Qd 3)  Multivitamins   Tabs (Multiple Vitamin) .[name] Kitchen.. 1 Tab Once Daily 4)  Calcium 500 Mg Tabs (Calcium) .[name] Kitchen.. 1 Tab Once Daily 5)  Vitamin C 500 Mg Tabs (Ascorbic Acid) .[name] Kitchen.. 1 Tab Once Daily 6)  Vitamin E 400 Unit Caps (Vitamin E) .[name] Kitchen.. 1 Cap Once Daily 7)  Fish Oil 1000 Mg Caps (Omega-3 Fatty Acids) .[name] Kitchen.. 1 Cap Once Daily 8)  Metformin Hcl 500 Mg  Tabs (Metformin Hcl) .[name] Kitchen.. 1 Tab Once Daily 9)  Plavix 75 Mg  Tabs (Clopidogrel Bisulfate) .[name] Kitchen.. 1 By Mouth Qd 10)  Levitra 20 Mg  Tabs (Vardenafil Hcl) .... 1/2 -1 As Needed , Not With Nitroglycerin 11)  Nitroquick 0.4 Mg  Subl (Nitroglycerin) .... Place Under Tongue As Needed For Chest Pain May Repeat Every 5 Minutes X3 12)  Simvastatin 40 Mg Tabs (Simvastatin) .[name] Kitchen.. 1 At Bedtime 13)  Aspirin Ec 325 Mg Tbec (Aspirin) .... Take One Tablet By Mouth Daily  Allergies (verified): No Known Drug Allergies  Past History:  Past Medical History: Reviewed history from 03/28/2010 and no changes required. CAD, NATIVE VESSEL (ICD-414.01) HYPERTENSION (ICD-401.9) HYPERLIPIDEMIA (ICD-272.4) DIABETES MELLITUS, CONTROLLED (ICD-250.00) UNSPECIFIED ANEMIA (ICD-285.9) COLONIC POLYPS, RECURRENT  (ICD-211.3) ERECTILE DYSFUNCTION (ICD-302.72)  Past Surgical History: Reviewed history from 12/04/2008 and no changes required. wisdom teeth resected colonoscopy with polyps 2002 infantile paralysis facial asymmetry food poisioning  stent 2007 colonoscopy with polyps 2004; 2007 negative (due 2017)  Family History: Reviewed history from 10/07/2007 and no changes required. Father:  died age 69 old age Mother: deceased age 95,  unknown reason Siblings: brother MI in fifties  Social History: Reviewed history from 03/28/2010 and no changes required. Former Smoker quit 1986 Full Time-maintenance Married, 1 child Alcohol Use - no Regular Exercise - no Drug Use - no  Review of Systems  The patient denies fatigue, malaise, fever, weight gain/loss, vision loss, decreased hearing, hoarseness, chest pain, palpitations, shortness of breath, prolonged cough, wheezing, sleep apnea, coughing up blood, abdominal pain, blood in stool, nausea, vomiting, diarrhea, heartburn, incontinence, blood in urine, muscle weakness, joint pain, leg swelling, rash, skin lesions, headache, fainting, dizziness, depression, anxiety, enlarged lymph nodes, easy bruising or bleeding, and environmental allergies.    Vital Signs:  Patient profile:   70 year old male Height:      71 inches Weight:      189 pounds BMI:     26.46 Pulse rate:   69 / minute Pulse rhythm:   irregular BP sitting:   116 / 60  (left arm) Cuff size:   large  Vitals Entered By: Danielle Rankin, CMA (Apr 01, 2010 12:05 PM)  Physical Exam  General:  General: Well developed,  well nourished, NAD HEENT: OP clear, mucus membranes moist SKIN: warm, dry Neuro: No focal deficits Musculoskeletal: Muscle strength 5/5 all ext Psychiatric: Mood and affect normal Neck: No JVD, no carotid bruits, no thyromegaly, no lymphadenopathy. Lungs:Clear bilaterally, no wheezes, rhonci, crackles CV: RRR no murmurs, gallops rubs Abdomen: soft, NT, ND, BS  present Extremities: No edema, pulses 2+.    Cardiac Cath  Procedure date:  03/03/2010  Findings:       1. The left main coronary artery had a distal 20% stenosis.   2. The left anterior descending appeared to have an ostial 20%       stenosis.  The left anterior descending artery was relatively       moderate sized in the proximal portion and became small caliber in       the mid and distal portion.  There was a moderate-sized diagonal       branch that coursed along the side of the LAD and appeared to have       a 65% tubular stenosis.  There were no flow-limiting lesions in the       mid or distal LAD.   3. The circumflex artery was composed primarily of a large bifurcating       obtuse marginal vessel that had diffuse 40% to 50% stenosis but no       flow-limiting lesions.  The AV groove circumflex had plaque       disease.   4. The right coronary artery was a large dominant vessel that actually       wrapped around the apex of the left ventricle.  Proximal portion of       the vessel had 40% stenosis.  There was a stent in the midportion       of the vessel with mild 20% in-stent restenosis.  There was       calcification noted in the proximal mid and distal vessel.  The mid       and distal portions of the vessel beyond the stent had diffuse 40%       stenosis.  There was a discrete 99% distal stenosis in the RCA       approximately 10 mm prior to the bifurcation into the posterior       descending artery and posterolateral branch.  The posterior       descending artery and posterolateral branch had plaque disease.       The posterior descending artery was large caliber and wrapped       around the apex of the left ventricle.   5. Left ventricular angiogram was performed in the RAO projection and       showed normal left ventricular systolic function with ejection       fraction of 50%.  There was hypokinesis of the inferoapical wall.      IMPRESSION:   1. Single-vessel  coronary artery disease with a patent stent in the       mid right coronary artery.  There was a severe distal right       coronary artery stenosis.   2. PCI RCA  EKG  Procedure date:  04/01/2010  Findings:      NSR, rate 69 bpm. Non-specific T wave abnormalities.   Impression & Recommendations:  Problem # 1:  CAD, NATIVE VESSEL (ICD-414.01) Doing well post PCI. Continue Plavix, ASA, statin, beta blocker.  He will need at least 12 months of dual antiplatelet therapy.  His updated medication list for this problem includes:    Metoprolol Tartrate 25 Mg Tabs (Metoprolol tartrate) .[name] Kitchen... 1 tab two times a day    Plavix 75 Mg Tabs (Clopidogrel bisulfate) .[name] Kitchen... 1 by mouth qd    Nitroquick 0.4 Mg Subl (Nitroglycerin) .[name] Kitchen... Place under tongue as needed for chest pain may repeat every 5 minutes x3    Aspirin Ec 325 Mg Tbec (Aspirin) .[name] Kitchen... Take one tablet by mouth daily  Problem # 2:  HYPERTENSION (ICD-401.9) Well controlled. NO changes.   His updated medication list for this problem includes:    Metoprolol Tartrate 25 Mg Tabs (Metoprolol tartrate) .[name] Kitchen... 1 tab two times a day    Aspirin Ec 325 Mg Tbec (Aspirin) .[name] Kitchen... Take one tablet by mouth daily  Problem # 3:  HYPERLIPIDEMIA (ICD-272.4) On statin. Followed in Dr. Caryl Never office.   His updated medication list for this problem includes:    Simvastatin 40 Mg Tabs (Simvastatin) .[name] Kitchen... 1 at bedtime  Patient Instructions: 1)  Your physician recommends that you schedule a follow-up appointment in: 6 months 2)  Your physician recommends that you continue on your current medications as directed. Please refer to the Current Medication list given to you today.

## 2010-12-02 NOTE — Progress Notes (Signed)
Summary: change rx  Phone Note Call from Patient Call back at Home Phone [phone]   Caller: Spouse/betty Reason for Call: Talk to Nurse Summary of Call: pt wife betty states they can not buy plavix because it cost to much. pt would like to know if the doctor can call in something else. pharmacy# [phone]. Initial call taken by: Roe Coombs,  September 17, 2010 11:07 AM  Follow-up for Phone Call        pt. needs to be on Plavix for at least one year & Effient is more expensive. They do have insurance so do not qualify for [employer]. We currently do not have any samples of Plavix so I will call them as soon as we get some more samples. Whitney Maeola Sarah RN  September 17, 2010 1:56 PM  Follow-up by: Whitney Maeola Sarah RN,  September 17, 2010 1:57 PM

## 2010-12-04 NOTE — Progress Notes (Signed)
Summary: Refill request  Phone Note Refill Request Call back at [phone] Message from:  Pharmacy on November 25, 2010 10:25 AM  Refills Requested: Medication #1:  METFORMIN HCL 500 MG  TABS 1 tab  two times a day with meals   Dosage confirmed as above?Dosage Confirmed   Supply Requested: 180   Last Refilled: 09/23/2010 [facility]  Next Appointment Scheduled: 6.28.12 Initial call taken by: Harold Barban,  November 25, 2010 10:26 AM    Prescriptions: METFORMIN HCL 500 MG  TABS (METFORMIN HCL) 1 tab  two times a day with meals  #180 x 0   Entered by:   Shonna Chock CMA   Authorized by:   [name] MD   Signed by:   Shonna Chock CMA on 11/25/2010   Method used:   Electronically to        [facility]* (retail)       [address]       [place], [place]  [address]       Ph: [phone]       Fax: [phone]   RxID:   [id number]

## 2010-12-04 NOTE — Assessment & Plan Note (Signed)
Summary: Z6X   Visit Type:  Follow-up Primary Provider:  [name] MD  CC:  6 month ROV; plavix samples (?).  History of Present Illness: 70 yo WM with history of DM, HTN, hyperlipideima and CAD with admission to [facility] 03/01/10 with NSTEMI. Cardiac cath on 03/03/10 with severe stenosis distal RCA and patent stent mid RCA. I placed a drug eluting stent in the area of severe stenosis in the distal RCA. He has done well since then. No chest pain, SOB, palpitations, near syncope or syncope. He has been tolerating all of his medications. His only complaint is pain in his right calf and thigh when walking. This is down the lateral aspect of the calf. No cramping in the calf.    Current Medications (verified): 1)  Metoprolol Tartrate 25 Mg Tabs (Metoprolol Tartrate) .[name] Kitchen.. 1 Tab Two Times A Day 2)  Fexofenadine Hcl 180 Mg  Tabs (Fexofenadine Hcl) .[name] Kitchen.. 1 By Mouth Qd 3)  Multivitamins   Tabs (Multiple Vitamin) .[name] Kitchen.. 1 Tab Once Daily 4)  Calcium 500 Mg Tabs (Calcium) .[name] Kitchen.. 1 Tab Once Daily 5)  Vitamin C 500 Mg Tabs (Ascorbic Acid) .[name] Kitchen.. 1 Tab Once Daily 6)  Vitamin E 400 Unit Caps (Vitamin E) .[name] Kitchen.. 1 Cap Once Daily 7)  Fish Oil 1000 Mg Caps (Omega-3 Fatty Acids) .[name] Kitchen.. 1 Cap Once Daily 8)  Metformin Hcl 500 Mg  Tabs (Metformin Hcl) .[name] Kitchen.. 1 Tab  Two Times A Day With Meals 9)  Plavix 75 Mg  Tabs (Clopidogrel Bisulfate) .[name] Kitchen.. 1 By Mouth Qd 10)  Levitra 20 Mg  Tabs (Vardenafil Hcl) .... 1/2 -1 As Needed , Not With Nitroglycerin 11)  Nitroquick 0.4 Mg  Subl (Nitroglycerin) .... Place Under Tongue As Needed For Chest Pain May Repeat Every 5 Minutes X3 12)  Simvastatin 40 Mg Tabs (Simvastatin) .[name] Kitchen.. 1 At Bedtime 13)  Aspirin Ec 325 Mg Tbec (Aspirin) .... Take One Tablet By Mouth Daily 14)  Onetouch Ultra Blue  Strp (Glucose Blood) .... Check Blood Sugar 1 X Daily 15)  Onetouch Delica Lancets  Misc (Lancets) .... Check Blood Sugar 1 X Daily  Allergies (verified): No Known Drug Allergies  Past  History:  Past Medical History: CAD, NATIVE VESSEL (ICD-414.01)s/p cath May 2011 with placement of DES distal RCA.  HYPERTENSION (ICD-401.9) HYPERLIPIDEMIA (ICD-272.4) DIABETES MELLITUS, CONTROLLED (ICD-250.00) UNSPECIFIED ANEMIA (ICD-285.9) COLONIC POLYPS, RECURRENT (ICD-211.3) ERECTILE DYSFUNCTION (ICD-302.72)  Social History: Reviewed history from 04/01/2010 and no changes required. Former Smoker quit 1986 Full Time-maintenance Married, 1 child Alcohol Use - no Regular Exercise - no Drug Use - no  Review of Systems  The patient denies fatigue, malaise, fever, weight gain/loss, vision loss, decreased hearing, hoarseness, chest pain, palpitations, shortness of breath, prolonged cough, wheezing, sleep apnea, coughing up blood, abdominal pain, blood in stool, nausea, vomiting, diarrhea, heartburn, incontinence, blood in urine, muscle weakness, joint pain, leg swelling, rash, skin lesions, headache, fainting, dizziness, depression, anxiety, enlarged lymph nodes, easy bruising or bleeding, and environmental allergies.         He describes pain in his right leg in the lateral aspect.   Vital Signs:  Patient profile:   70 year old male Height:      71 inches Weight:      193.75 pounds BMI:     27.12 Pulse rate:   64 / minute Pulse rhythm:   regular BP sitting:   132 / 62  (left arm) Cuff size:   regular  Vitals Entered By: Stanton Kidney,  EMT-P (October 16, 2010 12:05 PM)  Physical Exam  General:  General: Well developed, well nourished, NAD HEENT: OP clear, mucus membranes moist SKIN: warm, dry Neuro: No focal deficits Musculoskeletal: Muscle strength 5/5 all ext Psychiatric: Mood and affect normal Neck: No JVD, no carotid bruits, no thyromegaly, no lymphadenopathy. Lungs:Clear bilaterally, no wheezes, rhonci, crackles CV: RRR no murmurs, gallops rubs Abdomen: soft, NT, ND, BS present Extremities: No edema, pulses 1+ bilateral DP/PT.     Impression &  Recommendations:  Problem # 1:  CAD, NATIVE VESSEL (ICD-414.01) Stable. No changes in therapy. Continue ASA and Plavix.   His updated medication list for this problem includes:    Metoprolol Tartrate 25 Mg Tabs (Metoprolol tartrate) .[name] Kitchen... 1 tab two times a day    Plavix 75 Mg Tabs (Clopidogrel bisulfate) .[name] Kitchen... 1 by mouth qd    Nitroquick 0.4 Mg Subl (Nitroglycerin) .[name] Kitchen... Place under tongue as needed for chest pain may repeat every 5 minutes x3    Aspirin Ec 325 Mg Tbec (Aspirin) .[name] Kitchen... Take one tablet by mouth daily  Problem # 2:  LIMB PAIN (ICD-729.5) I do not think this represents claudication. His DP and PT pulses are normal. If the pain continues, he will call us and we can arrange non-invasive studies.   Patient Instructions: 1)  Your physician recommends that you schedule a follow-up appointment in: 6 months with Dr. Alyson Ingles Prescriptions: PLAVIX 75 MG  TABS (CLOPIDOGREL BISULFATE) 1 by mouth qd  #30 x 11   Entered by:   Stanton Kidney, EMT-P   Authorized by:   Verne Carrow, MD   Signed by:   Stanton Kidney, EMT-P on 10/16/2010   Method used:   Electronically to        [facility]* (retail)       [address]       [place], [place]  [address]       Ph: [phone]       Fax: [phone]   RxID:   [id number]

## 2011-01-20 LAB — LIPID PANEL
Cholesterol: 164 mg/dL (ref 0–200)
HDL: 34 mg/dL — ABNORMAL LOW (ref >39)
LDL Cholesterol: 97 mg/dL (ref 0–99)
Total CHOL/HDL Ratio: 4.8 RATIO
Triglycerides: 164 mg/dL — ABNORMAL HIGH (ref <150)
VLDL: 33 mg/dL (ref 0–40)

## 2011-01-20 LAB — GLUCOSE, CAPILLARY
Glucose-Capillary: 123 mg/dL — ABNORMAL HIGH (ref 70–99)
Glucose-Capillary: 132 mg/dL — ABNORMAL HIGH (ref 70–99)
Glucose-Capillary: 135 mg/dL — ABNORMAL HIGH (ref 70–99)
Glucose-Capillary: 147 mg/dL — ABNORMAL HIGH (ref 70–99)
Glucose-Capillary: 147 mg/dL — ABNORMAL HIGH (ref 70–99)
Glucose-Capillary: 156 mg/dL — ABNORMAL HIGH (ref 70–99)
Glucose-Capillary: 158 mg/dL — ABNORMAL HIGH (ref 70–99)

## 2011-01-20 LAB — CBC
HCT: 38.4 % — ABNORMAL LOW (ref 39.0–52.0)
Hemoglobin: 13.4 g/dL (ref 13.0–17.0)
Hemoglobin: 13.8 g/dL (ref 13.0–17.0)
MCHC: 35 g/dL (ref 30.0–36.0)
MCV: 90.1 fL (ref 78.0–100.0)
MCV: 90.2 fL (ref 78.0–100.0)
MCV: 89.9 fL (ref 78.0–100.0)
Platelets: 208 10*3/uL (ref 150–400)
Platelets: 246 10*3/uL (ref 150–400)
Platelets: 220 10*3/uL (ref 150–400)
RBC: 4.26 MIL/uL (ref 4.22–5.81)
RBC: 4.44 MIL/uL (ref 4.22–5.81)
RDW: 12.7 % (ref 11.5–15.5)
WBC: 10.9 10*3/uL — ABNORMAL HIGH (ref 4.0–10.5)
WBC: 7.5 10*3/uL (ref 4.0–10.5)
WBC: 9.2 10*3/uL (ref 4.0–10.5)
WBC: 8 10*3/uL (ref 4.0–10.5)

## 2011-01-20 LAB — BASIC METABOLIC PANEL
BUN: 16 mg/dL (ref 6–23)
BUN: 17 mg/dL (ref 6–23)
CO2: 26 mEq/L (ref 19–32)
Calcium: 9 mg/dL (ref 8.4–10.5)
Calcium: 9.6 mg/dL (ref 8.4–10.5)
Chloride: 105 mEq/L (ref 96–112)
Chloride: 105 mEq/L (ref 96–112)
Creatinine, Ser: 1.07 mg/dL (ref 0.4–1.5)
Creatinine, Ser: 1.18 mg/dL (ref 0.4–1.5)
Creatinine, Ser: 1.24 mg/dL (ref 0.4–1.5)
GFR calc Af Amer: >60 mL/min (ref >60)
GFR calc Af Amer: >60 mL/min (ref >60)
GFR calc non Af Amer: >60 mL/min (ref >60)
GFR calc non Af Amer: >60 mL/min (ref >60)
Glucose, Bld: 125 mg/dL — ABNORMAL HIGH (ref 70–99)
Potassium: 3.9 mEq/L (ref 3.5–5.1)
Sodium: 137 mEq/L (ref 135–145)
Sodium: 140 mEq/L (ref 135–145)

## 2011-01-20 LAB — HEPARIN LEVEL (UNFRACTIONATED): Heparin Unfractionated: 0.32 IU/mL (ref 0.30–0.70)

## 2011-01-20 LAB — CK TOTAL AND CKMB (NOT AT ARMC)
Relative Index: 2 (ref 0.0–2.5)
Total CK: 215 U/L (ref 7–232)
Total CK: 233 U/L — ABNORMAL HIGH (ref 7–232)

## 2011-01-20 LAB — DIFFERENTIAL
Basophils Absolute: 0 10*3/uL (ref 0.0–0.1)
Basophils Relative: 1 % (ref 0–1)
Basophils Relative: 0 % (ref 0–1)
Eosinophils Absolute: 0.4 10*3/uL (ref 0.0–0.7)
Eosinophils Absolute: 0.6 10*3/uL (ref 0.0–0.7)
Eosinophils Absolute: 0.5 10*3/uL (ref 0.0–0.7)
Eosinophils Relative: 5 % (ref 0–5)
Lymphocytes Relative: 28 % (ref 12–46)
Lymphs Abs: 2.1 10*3/uL (ref 0.7–4.0)
Lymphs Abs: 2.7 10*3/uL (ref 0.7–4.0)
Monocytes Absolute: 0.6 10*3/uL (ref 0.1–1.0)
Monocytes Absolute: 1 10*3/uL (ref 0.1–1.0)
Monocytes Relative: 11 % (ref 3–12)
Monocytes Relative: 8 % (ref 3–12)
Monocytes Relative: 10 % (ref 3–12)
Neutro Abs: 4.4 10*3/uL (ref 1.7–7.7)
Neutro Abs: 4.9 10*3/uL (ref 1.7–7.7)
Neutrophils Relative %: 53 % (ref 43–77)
Neutrophils Relative %: 58 % (ref 43–77)
Neutrophils Relative %: 65 % (ref 43–77)

## 2011-01-20 LAB — CARDIAC PANEL(CRET KIN+CKTOT+MB+TROPI)
CK, MB: 3.5 ng/mL (ref 0.3–4.0)
Relative Index: 2.2 (ref 0.0–2.5)
Total CK: 154 U/L (ref 7–232)
Total CK: 158 U/L (ref 7–232)
Troponin I: 0.07 ng/mL — ABNORMAL HIGH (ref 0.00–0.06)
Troponin I: 0.11 ng/mL — ABNORMAL HIGH (ref 0.00–0.06)

## 2011-01-20 LAB — TROPONIN I: Troponin I: 0.03 ng/mL (ref 0.00–0.06)

## 2011-01-20 LAB — HEMOGLOBIN A1C: Mean Plasma Glucose: 128 mg/dL — ABNORMAL HIGH (ref <117)

## 2011-01-20 LAB — PROTIME-INR: INR: 0.95 (ref 0.00–1.49)

## 2011-01-20 LAB — TSH: TSH: 0.898 u[IU]/mL (ref 0.350–4.500)

## 2011-01-20 LAB — C-REACTIVE PROTEIN: CRP: 0.1 mg/dL — ABNORMAL LOW (ref <0.6)

## 2011-01-20 LAB — MRSA PCR SCREENING: MRSA by PCR: NEGATIVE

## 2011-01-20 LAB — BRAIN NATRIURETIC PEPTIDE: Pro B Natriuretic peptide (BNP): 49 pg/mL (ref 0.0–100.0)

## 2011-03-20 NOTE — Assessment & Plan Note (Signed)
[facility]                        [place] OFFICE NOTE   NAME:[name], Gregory Bates                         MRN:          [id number]  DATE:01/07/2007                            DOB:          Jul 23, 1941    [name] was seen in followup January 07, 2007, to address his dyslipidemia  and diabetes.  Lipid profile on February 14 revealed a total cholesterol  of 184, HDL of 34, LDL of 121, on simvastatin 40 mg daily.   He denies any cardiopulmonary symptoms, despite cardiovascular exercise  six days a week.Significantly he had stenting in 2007 and is on Plavix.   His last hemoglobin A1c was in March of 2004, which revealed a value of  7.3%.  This would be associated with 46% increased risk of heart attack  or stroke, and an average sugar of approximately 180.   Since receiving his lipid panel, he has altered his diet.  He has  decreased his candy intake.  He has continued his exercise.  He denies  any polyuria, polyphagia or polydipsia.  He has had no skin lesions and  no neurologic symptoms or signs.   Weight was down 11.5 pounds today at 191, pulse 68, respiratory rate 14  and blood pressure 120/76.   Thyroid was normal to palpation.  Chest was clear to auscultation.  He  exhibited an S4 with slowing.  All pulses were intact.  He had no edema.  There were no skin lesions.   The risks for cardiovascular or neurovascular event were discussed with  [name].  Based on his LDL of 121, he probably has low risk of 15-20%.  Blood pressure is well-controlled and he no longer smokes.   Zetia will be added with followup lipid profile after four months.  An  A1c will be checked today.  If the A1c is excessively elevated, then  intervention will be pursued.  His fasting blood sugar should be 90s to  130s with the average less than 110.  Two hours after the largest meal,  it should be less than 180 maximally.   A copy of this report will be sent to Dr.  Jens Som to provide continuity  care.     Titus Dubin. Alwyn Ren, MD,FACP,FCCP  Electronically Signed    [facility]/MedQ  DD: 01/07/2007  DT: 01/07/2007  Job #: [id number]   cc:   Madolyn Frieze. Jens Som, MD, [facility]

## 2011-03-20 NOTE — Discharge Summary (Signed)
NAMERONAK, DUQUETTE NO.:  [id number]   MEDICAL RECORD NO.:  [id number]          PATIENT TYPE:  OIB   LOCATION:  6531                         FACILITY:  [facility]   PHYSICIAN:  Joellyn Rued, P.A. [facility] DATE OF BIRTH:  30-Apr-1941   DATE OF ADMISSION:  01/21/2006  DATE OF DISCHARGE:  01/22/2006                           DISCHARGE SUMMARY - REFERRING   HISTORY OF PRESENT ILLNESS:  Gregory Bates is a 70 year old male who was seen in  the office on January 19, 2006 secondary to chest discomfort. He described  this as a tightness without radiation but with associated shortness of  breath. It occurs with exertion and is relieved with rest. It does not occur  with routine activities, nor has it been at rest or nocturnal. He was  initially seen by Dr. Quintella Reichert and referred for further evaluation. His  history is also notable for hyperlipidemia and seasonal allergies.   LABORATORY DATA:  Hemoglobin and hematocrit is 14.5 and 42.3. Normal  indices. Platelets 289,000. White blood cell count is 8.4. Prior to  discharge, hemoglobin and hematocrit was 12.9 and 37.2 with normal indices.  Platelets 240,000. White blood cell count is 8.4. Pre-admission PTT was  37.9, PT 11.4. Admission sodium was 137, potassium 3.9, BUN 13, creatinine  1.1, glucose 113, normal liver function studies. Prior to discharge, sodium  was 140, potassium 4.7, BUN 14, glucose 141. Post procedure CK total MB was  171 and 2.8. Troponin 0.02.   EKG's prior to admission showed normal sinus rhythm, normal axis, non-  specific ST-T wave changes.   HOSPITAL COURSE:  Gregory Bates was brought in for an outpatient cardiac  catheterization. This was performed on January 20, 2006 by Dr. Gala Romney  without difficulty. He had calcified arteries in the right and left systems.  He had a 30% distal left main, 30% proximal circumflex, 50% diagonal 1. The  RCA had a 40% proximal lesion, 80% ruptured plaques in the proximal RCA, 60%  distal  RCA, and 30% PDA. EF was 55% with mild inferior apical hypokinesis.  Aorta showed some moderate disease. Renal's were patent. Dr. Gala Romney  reviewed findings with Dr. Samule Ohm. Bare metal stenting was placed to the mid  RCA, reducing the 80% lesion to 0 percent without difficulty. Dr. Samule Ohm  recommended Plavix for at least 30 days and aspirin indefinitely. On January 22, 2006, he was evaluated by Dr. Jens Som and it was felt that he could be  discharged home. Just prior to discharge, Dr. Jens Som changed his Zocor to  Lipitor 80 mg q.h.s. The catheterization site was intact.   DISCHARGE DIAGNOSES:  1.  Unstable angina, status post bare metal stenting to the mid right      coronary artery, residual non-obstructive coronary artery disease with      normal left ventricular function.  2.  History of hyperlipidemia.   PROCEDURES:  1.  Cardiac catheterization on January 20, 2006 by Dr. Gala Romney.  2.  Status post bare metal stenting to the mid right coronary artery by Dr.      Samule Ohm on January 21, 2006.   DIET:  He is asked to maintain low-fat, low-salt, low-cholesterol diet.   ACTIVITY/WOUND CARE:  Per the supplemental discharge instruction sheet.   SPECIAL INSTRUCTIONS:  He was asked to bring all medications to all  appointments.   NEW MEDICATIONS:  1.  Plavix 75 mg for at least 30 days.  2.  Toprol XL 25 mg daily.  3.  Lipitor 80 mg q.h.s.  4.  Nitroglycerin 0.4 as needed.  He was asked to continue:  1.  Aspirin 325 mg daily.  2.  Astelin nasal spray daily.  3.  Fexofenadine 180 mg daily.  4.  He was instructed NOT to take his Zocor.   FOLLOW UP:  1.  With Dr. Jens Som in the office on February 11, 2006 at 10:30 a.m.  2.  He will need blood work in approximately 6 to 8 weeks in regards to      fasting lipids and liver function studies, since Lipitor was initiated.   DISCHARGE TIME:  Less than 30 minutes.      Joellyn Rued, P.A. [facility]     EW/MEDQ  D:  01/22/2006  T:  01/24/2006   Job:  [id number]   cc:   Olga Millers, M.D. [facility]  [address]  [address]  [place]  [place] [address]   Lacretia Leigh. Quintella Reichert, M.D.  [address]  [place]  [place] [address]

## 2011-03-20 NOTE — Cardiovascular Report (Signed)
Gregory Bates, Gregory Bates NO.:  [id number]   MEDICAL RECORD NO.:  [id number]          PATIENT TYPE:  OIB   LOCATION:  1965                         FACILITY:  [facility]   PHYSICIAN:  Arvilla Meres, M.D. LHCDATE OF BIRTH:  06-01-1941   DATE OF PROCEDURE:  01/20/2006  DATE OF DISCHARGE:                              CARDIAC CATHETERIZATION   PRIMARY CARE PHYSICIAN:  Titus Dubin. Alwyn Ren, M.D. [facility].   CARDIOLOGIST:  Olga Millers, M.D. [facility].   HISTORY OF PRESENT ILLNESS:  Gregory Bates is a very pleasant 70 year old male  with a history of hyperlipidemia, but no known cardiac disease. For the past  3 weeks, he has been having exertional chest pain. He has not had any  resting angina. He was seen in the office yesterday by Dr. Jens Som and was  set up for a cardiac catheterization in the outpatient lab today.   PROCEDURES PERFORMED:  1.  Selective coronary angiography.  2.  Left heart cath.  3.  Left ventriculogram.  4.  Abdominal aortogram.   DESCRIPTION OF PROCEDURE:  The risks and benefits of catheterization were  explained to Gregory Bates.  Consent was signed and placed on the chart.  A 4-  French sheath was placed in right femoral artery using a modified Seldinger  technique. Standard catheters, including preformed Judkins JL-4, JR-4 and  angled pigtail were used for the procedure.  All catheter exchanges were  made over wire. There were no apparent complications.   Central aortic pressure 120/66, with a mean of 90. LV pressure was 138/40,  with an EDP of 12. There is no aortic stenosis.   Left main: There is a distal 30 to 40% stenosis leading into the ostial LAD.   LAD was a moderate size vessel. It gave off one small diagonal and one long  diagonal, which was fairly narrow in caliber. There are multiple septal  perforators. The distal LAD was diminutive. The proximal LAD was moderately  calcified. In the second diagonal there is a 50% tubular lesion.   Left circumflex  was a moderate size vessel made up mostly of a large  branching OM-1 and a small OM-2. In the OM-1, there is a 30% proximal lesion  and a 40% distal lesion.   Right coronary artery was a very large dominant vessel.  It gave off a small  RV branch, a large PDA, and two small PLs. The proximal portion of the RCA  was heavily calcified through the midsection.  There was a 40% tubular  lesion proximally.  In the mid section, proximal to mid section, there was a  focal 80% lesion, which had the appearance of a previously ruptured plaque.  In the distal RCA before the takeoff of the PDA, there was a 60% lesion.  In  the proximal PDA there was a 30% stenosis.   Left ventriculogram done in the RAO position showed an EF of about 55%.  There was  no mitral regurgitation. There was a mild distal inferior  hypokinesis.   Abdominal aortogram showed patent renal arteries bilaterally. There is some  mild to moderate plaquing of the infrarenal abdominal aorta without any  obvious aneurysm.   ASSESSMENT:  1.  Three-vessel coronary artery disease, as described above, with high-      grade lesion in the mid right coronary artery  concerning for a      previously ruptured plaque.  2.  Normal left ventricular function with an ejection fraction of 55% and      mild inferior hypokinesis.  3.  Mild to moderate peripheral arterial disease with patent renal arteries,      as described above.   PLAN/DISCUSSION:  Gregory Bates will need percutaneous intervention on his mid  right coronary artery in the next few days. He will be started on Plavix. I  told him that if he develops unstable symptoms, he is to call 9-1-1 and be  transported to the emergency room immediately. Continue all current  medications for now.      Arvilla Meres, M.D. [facility]  Electronically Signed     DB/MEDQ  D:  01/20/2006  T:  01/21/2006  Job:  [id number]   cc:   Olga Millers, M.D. [facility]  [address]  [address]  [place]  [place]  [address]   Titus Dubin. Alwyn Ren, M.D. [facility]  [phone] [place]  [place] [address]

## 2011-03-20 NOTE — Assessment & Plan Note (Signed)
[facility]                            CARDIOLOGY OFFICE NOTE   NAME:Gregory Bates, Gregory Bates                         MRN:          [id number]  DATE:09/30/2006                            DOB:          [birth year]    Gregory Bates returns for followup today.  He has a history of coronary  artery disease as outlined in my previous notes.  Since I last saw him,  he is doing well with no dyspnea on exertion, orthopnea, PND, pedal  edema, palpitations, pre-syncope, syncope, chest pain.   MEDICATIONS:  1. Include Plavix 75 mg p.o. daily.  2. Aspirin 325 mg p.o. daily.  3. Fexofenadine 180 mg p.o. daily.  4. Zocor 40 mg p.o. nightly.  5. Toprol 50 mg p.o. daily.  6. Multivitamin.  7. Fish oil.   PHYSICAL EXAM:  Blood pressure 130/60, his pulse is 60.  NECK:  Supple with no bruits.  CHEST:  Clear.  CARDIOVASCULAR:  Regular rate and rhythm.  ABDOMEN:  No pulsatile masses.  No bruits.  EXTREMITIES:  No edema.   ELECTROCARDIOGRAM:  Sinus rhythm at a rate of 62.  There are nonspecific  ST changes.   DIAGNOSES:  1. Coronary artery disease.  2. Hyperlipidemia.   PLAN:  Gregory Bates is doing well form a symptomatic standpoint.  We will  continue with his present medications.  He is complaining of easy  bruising.  I have asked him to decrease his aspirin to 81 mg p.o. daily.  I will continue his Plavix for 1 year and then we will discontinue that  medication.  His most recent lipids showed an HDL or 27 and an LDL of  83.  I have asked him to increase his Zocor to 80 mg p.o. daily, and we  will check lipids and liver in 6 weeks.  Note, I did recommend Niaspan,  but he is not wanting to try that medication.  We discussed the  importance of diet and exercise.  Also note, we have provided literature  on a heart-healthy diet today.  He does not smoke.  I will see him back  in 9 months.    Madolyn Frieze Jens Som, MD, [facility]  Electronically Signed   BSC/MedQ  DD: 09/30/2006  DT:  09/30/2006  Job #: [id number]   cc:   Titus Dubin. Alwyn Ren, MD,FACP,FCCP

## 2011-03-20 NOTE — Cardiovascular Report (Signed)
Gregory Bates, Gregory Bates NO.:  [id number]   MEDICAL RECORD NO.:  [id number]           PATIENT TYPE:   LOCATION:                                 FACILITY:   PHYSICIAN:  Rollene Rotunda, M.D.        DATE OF BIRTH:   DATE OF PROCEDURE:  02/10/2006  DATE OF DISCHARGE:                              CARDIAC CATHETERIZATION   PRIMARY CARE PHYSICIAN:  Dr. Alwyn Ren   CARDIOLOGIST:  Dr. Jens Som   PROCEDURE:  1.  Left heart catheterization.  2.  Coronary arteriography.   INDICATIONS:  Evaluate patient with chest pain suggestive of his previous  unstable angina.  He had a recent non-drug-coated stent to his right  coronary artery.   PROCEDURE NOTE:  Left heart catheterization was performed via the right  femoral artery.  The artery was cannulated using anterior wall puncture.  A  #4-French arterial sheath was inserted via the modified Seldinger technique.  Preformed Judkins and a pigtail catheter were utilized.  The patient  tolerated the procedure well and left the laboratory in stable condition.   RESULTS:   HEMODYNAMICS:  LV 123/10, AO 124/90.   CORONARIES:  The left main was heavily calcified.  There was distal 30-40%  stenosis.  The LAD was not a particularly large vessel and did not wrap the  apex.  There was also 30% stenosis.  There was proximal long calcification.  First diagonal was moderate sized and had diffuse disease of the distal  subtotal stenosis.  Second diagonal was moderate sized and relatively normal  throughout its course.  Circumflex in the AV groove had 30% stenosis after a  large obtuse marginal.  The obtuse marginal was large.  Had had proximal  tandem 25% lesions and a mid 30% stenosis.  As the vessel tapered there was  a distal 60% stenosis at a bifurcation.  The superior branch of this  bifurcation was small and had long 70% stenosis.  The right coronary artery  was the dominant vessel.  There was a long proximal 30% stenosis.  The  proximal  stent was patent with in-stent luminal irregularities.  There was  long mid 25% stenosis with calcification.  There were tandem 40% and 60%  lesions for the PDA.  The PDA was long with diffuse 25% stenosis.  Left  ventricle:  The left ventricle was not injected as this had been done  recently.   CONCLUSION:  Diffuse non-obstructive large vessel disease with a patent  stent in the right coronary artery.  He does have small vessel disease as  described.   PLAN:  The patient will continue to have aggressive medical management  secondary to risk reduction.           ______________________________  Rollene Rotunda, M.D.     JH/MEDQ  D:  02/10/2006  T:  02/10/2006  Job:  [id number]   cc:   Olga Millers, M.D. [facility]  [address]  [address]  [place]  [place] [address]   Titus Dubin. Alwyn Ren, M.D. [facility]  [phone] [address]  [place]  [place] [address]

## 2011-03-20 NOTE — Cardiovascular Report (Signed)
[name], [name] NO.:  [id number]   MEDICAL RECORD NO.:  [id number]          PATIENT TYPE:  OIB   LOCATION:  6531                         FACILITY:  [facility]   PHYSICIAN:  Salvadore Farber, M.D. LHCDATE OF BIRTH:  May 09, 1941   DATE OF PROCEDURE:  01/21/2006  DATE OF DISCHARGE:  01/22/2006                              CARDIAC CATHETERIZATION   PROCEDURE:  Bare-metal stenting to the mid-RCA.   INDICATIONS:  [name] is a 70 year old gentleman with hyperlipidemia and  family history of premature atherosclerotic disease. He presents with  progressive exertional chest discomfort over the past six weeks. He  underwent diagnostic angiography by Dr. Gala Romney yesterday. This  demonstrated an 80% stenosis of the mid-RCA and preserved left ventricular  systolic function.  He was referred for percutaneous intervention on this  lesion. There was also at least moderate stenosis of the distal RCA. The  plan is to obtain additional views after the administration of  nitroglycerin.   PROCEDURAL TECHNIQUE:  Informed consent was obtained. Under 1% lidocaine  local anesthesia, a 6-French sheath was placed in the right common femoral  artery using the modified Seldinger technique. A JR-4 guiding catheter was  advanced over wire and engaged in the ostium of the right coronary artery.  Views were obtained of the distal lesion in multiple views after the  administration of 200 mcg of intracoronary nitroglycerin. The lesion  appeared to be approximately 50%.  Decision was therefore made to manage it  conservatively.   Attention was then turned to the lesion of the mid-vessel. Anticoagulation  was initiated with heparin, Plavix, and double bolus eptifibatide. ACT was  maintained at greater than 200 seconds. A Prowater wire was advanced to the  distal RCA without difficulty. The lesion was dilated using a 3.0 x 12 mm  Maverick at 8 atmospheres. It was then stented using a 3.5 x 12  mm Vision  deploying it at 16 atmospheres. The stent was then postdilated using a 3.75  x 12 mm Quantum at 18 atmospheres. Final angiography demonstrated no  residual stenosis and no dissection.   The arteriotomy was then closed using a Starclose device. Complete  hemostasis was obtained.  He was then transferred to holding room in stable  condition having tolerated the procedure well.   COMPLICATIONS:  None.   IMPRESSION/RECOMMENDATIONS:  Successful percutaneous intervention on the  calcified lesion of the mid-right coronary artery reducing stenosis from 80%  to 0%.  Bare-metal stent was used.  He should therefore be maintained on  Plavix for 30 days.  Aspirin will be continued indefinitely.      Salvadore Farber, M.D. [facility]  Electronically Signed     WED/MEDQ  D:  01/21/2006  T:  01/22/2006  Job:  [id number]   cc:   Titus Dubin. Alwyn Ren, M.D. [facility]  [phone] [place]  [place] [address]   Olga Millers, M.D. [facility]  [address]  [address]  [place]  [place] [address]

## 2011-03-23 ENCOUNTER — Other Ambulatory Visit: Payer: Self-pay | Admitting: Internal Medicine

## 2011-04-03 ENCOUNTER — Other Ambulatory Visit: Payer: Self-pay | Admitting: Internal Medicine

## 2011-04-07 ENCOUNTER — Encounter: Payer: Self-pay | Admitting: Cardiovascular Disease

## 2011-04-07 ENCOUNTER — Encounter: Payer: Self-pay | Admitting: *Deleted

## 2011-04-22 ENCOUNTER — Other Ambulatory Visit: Payer: Self-pay | Admitting: Internal Medicine

## 2011-04-22 DIAGNOSIS — E785 Hyperlipidemia, unspecified: Secondary | ICD-10-CM

## 2011-04-22 DIAGNOSIS — IMO0001 Reserved for inherently not codable concepts without codable children: Secondary | ICD-10-CM

## 2011-04-22 DIAGNOSIS — T887XXA Unspecified adverse effect of drug or medicament, initial encounter: Secondary | ICD-10-CM

## 2011-04-23 ENCOUNTER — Other Ambulatory Visit (INDEPENDENT_AMBULATORY_CARE_PROVIDER_SITE_OTHER): Payer: 59

## 2011-04-23 DIAGNOSIS — E785 Hyperlipidemia, unspecified: Secondary | ICD-10-CM

## 2011-04-23 DIAGNOSIS — T887XXA Unspecified adverse effect of drug or medicament, initial encounter: Secondary | ICD-10-CM

## 2011-04-23 DIAGNOSIS — IMO0001 Reserved for inherently not codable concepts without codable children: Secondary | ICD-10-CM

## 2011-04-23 LAB — MICROALBUMIN / CREATININE URINE RATIO: Microalb Creat Ratio: 0.8 mg/g (ref 0.0–30.0)

## 2011-04-23 LAB — LIPID PANEL
Cholesterol: 178 mg/dL (ref 0–200)
HDL: 33.5 mg/dL — ABNORMAL LOW (ref >39.00)
Triglycerides: 125 mg/dL (ref 0.0–149.0)
VLDL: 25 mg/dL (ref 0.0–40.0)

## 2011-04-23 LAB — HEPATIC FUNCTION PANEL: Albumin: 4.5 g/dL (ref 3.5–5.2)

## 2011-04-23 NOTE — Progress Notes (Signed)
Labs only

## 2011-04-30 ENCOUNTER — Ambulatory Visit (INDEPENDENT_AMBULATORY_CARE_PROVIDER_SITE_OTHER): Payer: 59 | Admitting: Internal Medicine

## 2011-04-30 ENCOUNTER — Encounter: Payer: Self-pay | Admitting: Internal Medicine

## 2011-04-30 DIAGNOSIS — I2510 Atherosclerotic heart disease of native coronary artery without angina pectoris: Secondary | ICD-10-CM

## 2011-04-30 DIAGNOSIS — E785 Hyperlipidemia, unspecified: Secondary | ICD-10-CM

## 2011-04-30 DIAGNOSIS — E119 Type 2 diabetes mellitus without complications: Secondary | ICD-10-CM

## 2011-04-30 DIAGNOSIS — I10 Essential (primary) hypertension: Secondary | ICD-10-CM

## 2011-04-30 NOTE — Progress Notes (Signed)
  Subjective:    Patient ID: Gregory Bates, male    DOB: 12-24-1940, 70 y.o.   MRN: [id number]  HPI #1  HYPERTENSION Disease Monitoring: Blood pressure range-?;" usually 120/80 @ [employer]"  Chest pain, palpitations- no      Dyspnea- no Medications: Compliance- yes  Lightheadedness,Syncope- no    Edema- no   #2 DIABETES Disease Monitoring: Blood Sugar ranges-?; average 115  Polyuria/phagia/dipsia- no       Visual problems- no Medications: Compliance- yes  Hypoglycemic symptoms- no Eye exam: Oct 2011; no retinopathy Foot exam: no A1c 6.7% (average sugar 147, risk 34%);prev A1c 7.2(160,44%)   #3 HYPERLIPIDEMIA Disease Monitoring: See symptoms for Hypertension Medications:Simvastatin 40 mg. He states : "I will only take Simvastatin ; Crestor made me weak" Compliance- yes  Abd pain, bowel changes- no   Muscle aches- no Labs: LDL 120 ; prev 97 .    ROS See HPI above   PMH Smoking Status noted :quit 1988      Review of Systems     Objective:   Physical Exam Gen.: Healthy and well-nourished in appearance. Alert, appropriate and cooperative throughout exam. Eyes: No corneal or conjunctival inflammation noted. Lungs: Normal respiratory effort; chest expands symmetrically. Lungs are clear to auscultation without rales, wheezes, or increased work of breathing. Heart: Normal rate and rhythm. Normal S1 and S2. No gallop, click, or rub. S4 w/o murmur. Abdomen: Bowel sounds normal; abdomen soft and nontender. No masses, organomegaly or hernias noted. No clubbing, cyanosis, edema, or deformity noted.  Nail health  good. Vascular: Carotid, radial artery, R  dorsalis pedis and L dorsalis posterior tibial pulses are full and equal. Decreased No bruits present. Neurologic: Alert and oriented x3. Deep tendon reflexes symmetrical and normal.          Skin: Intact without suspicious lesions or rashes. Lymph: No cervical, axillary, or inguinal lymphadenopathy present. Psych: Mood and  affect are normal. Normally interactive                                                                                         Assessment & Plan:  #1 diabetes; control significantly better with decreased risk  #2 hypertension; controlled  #3 dyslipidemia; LDL is not at goal of at least less than 100  Plan: Increase simvastatin 40 mg at bedtime to one daily except one & one  half Tuesday Thursday and Saturday.  Recheck labs in 10-12 weeks.

## 2011-04-30 NOTE — Patient Instructions (Signed)
Please  schedule fasting Labs in 10-12 weeks : Lipids, AST, ALT, A1c( 250.00, 272.4).

## 2011-05-08 ENCOUNTER — Encounter: Payer: Self-pay | Admitting: Cardiovascular Disease

## 2011-05-08 ENCOUNTER — Ambulatory Visit (INDEPENDENT_AMBULATORY_CARE_PROVIDER_SITE_OTHER): Payer: BC Managed Care – PPO | Admitting: Cardiovascular Disease

## 2011-05-08 VITALS — BP 125/70 | HR 73 | Ht 71.0 in | Wt 186.0 lb

## 2011-05-08 DIAGNOSIS — I2510 Atherosclerotic heart disease of native coronary artery without angina pectoris: Secondary | ICD-10-CM

## 2011-05-08 NOTE — Progress Notes (Signed)
History of Present Illness:69 yo WM with history of DM, HTN, hyperlipidemia and CAD with admission to [facility] 03/01/10 with NSTEMI. Cardiac cath on 03/03/10 with severe stenosis distal RCA and patent stent mid RCA. I placed a drug eluting stent in the area of severe stenosis in the distal RCA. He has done well since then. No chest pain, SOB, palpitations, near syncope or syncope. Leg pain has improved. He has been active and is still working in maintenance in an apartment complex. Feeling great. LDL is 120 but he has not tolerated Crestor or Lipitor. He is tolerating his Zocor. Dr. Alwyn Ren is managing his lipids and has increased his Zocor to 40 mg daily alternating with 60 mg every other day.   Past Medical History  Diagnosis Date  . CAD (coronary artery disease), native coronary artery     s/p cath May 2011 with placement of DES distal RCA.  [name] Kitchen Hypertension   . Hyperlipidemia   . Diabetes mellitus   . Anemia, unspecified   . Colorectal polyps     recurrent  . Erectile dysfunction     Past Surgical History  Procedure Date  . Wisdom tooth extraction   . Colonoscopy w/ polypectomy 2002  . Infantile paralysis facial asymmetry   . Coronary stent placement 2007  . Colonoscopy w/ polypectomy 2004; 2007 negative    Current Outpatient Prescriptions  Medication Sig Dispense Refill  . aspirin 325 MG EC tablet Take 325 mg by mouth daily.        . calcium gluconate 500 MG tablet Take 500 mg by mouth daily.        . clopidogrel (PLAVIX) 75 MG tablet Take 75 mg by mouth daily.        . fexofenadine (ALLEGRA) 180 MG tablet Take 180 mg by mouth daily.        . fish oil-omega-3 fatty acids 1000 MG capsule Take 1 capsule daily.       [name] Kitchen glucose blood (ONE TOUCH ULTRA TEST) test strip 1 each by Other route as needed. Use as instructed. Check blood sugar 1 time daily.       . metFORMIN (GLUCOPHAGE) 500 MG tablet TAKE 1 TABLET TWICE DAILY WITH MEALS  180 tablet  1  . metoprolol succinate  (TOPROL-XL) 25 MG 24 hr tablet TAKE 1 TABLET BY MOUTH TWICE DAILY  180 tablet  1  . metoprolol tartrate (LOPRESSOR) 25 MG tablet Take 25 mg by mouth 2 (two) times daily.        . Multiple Vitamin (MULTIVITAMIN) tablet Take 1 tablet by mouth daily.        . nitroGLYCERIN (NITROSTAT) 0.4 MG SL tablet Place 0.4 mg under the tongue every 5 (five) minutes as needed.        [name] DELICA LANCETS MISC by Does not apply route. Check blood sugar 1 time daily.       . simvastatin (ZOCOR) 40 MG tablet TAKE 1 TABLET BY MOUTH AT BEDTIME  90 tablet  0  . vitamin C (ASCORBIC ACID) 500 MG tablet Take 500 mg by mouth daily.        . vitamin E 400 UNIT capsule Take 400 Units by mouth daily.        [name] Kitchen DISCONTD: vardenafil (LEVITRA) 20 MG tablet Take 1/2 - 1 tablet as needed, NOT with Nitroglycerin         No Known Allergies  History   Social History  . Marital Status: Married  Spouse Name: N/A    Number of Children: 1  . Years of Education: N/A   Occupational History  . maintenance     full time   Social History Main Topics  . Smoking status: Former Smoker    Quit date: 11/02/1984  . Smokeless tobacco: Not on file  . Alcohol Use: No  . Drug Use: No  . Sexually Active: Not on file   Other Topics Concern  . Not on file   Social History Narrative  . No narrative on file    Family History  Problem Relation Age of Onset  . Heart attack Brother 50    Review of Systems:  As stated in the HPI and otherwise negative.   BP 125/70  Pulse 73  Ht 5\' 11"  (1.803 m)  Wt 186 lb (84.369 kg)  BMI 25.94 kg/m2  Physical Examination: General: Well developed, well nourished, NAD HEENT: OP clear, mucus membranes moist SKIN: warm, dry. No rashes. Neuro: No focal deficits Musculoskeletal: Muscle strength 5/5 all ext Psychiatric: Mood and affect normal Neck: No JVD, no carotid bruits, no thyromegaly, no lymphadenopathy. Lungs:Clear bilaterally, no wheezes, rhonci, crackles Cardiovascular:  Regular rate and rhythm. No murmurs, gallops or rubs. Abdomen:Soft. Bowel sounds present. Non-tender.  Extremities: No lower extremity edema. Pulses are 2 + in the bilateral DP/PT.  EKG:NSR, rate 71 bpm. Non-specific T wave abnormality.

## 2011-05-08 NOTE — Assessment & Plan Note (Signed)
Stable. Continue ASA and Plavix. Will lower ASA to 81 mg po Qdaily. Continue beta blocker.

## 2011-08-14 LAB — POCT CARDIAC MARKERS
CKMB, poc: 2
Operator id: 192351
Troponin i, poc: <0.05
Troponin i, poc: <0.05

## 2011-08-14 LAB — I-STAT 8, (EC8 V) (CONVERTED LAB)
Acid-Base Excess: 4 — ABNORMAL HIGH
Chloride: 106
Hemoglobin: 13.6
Potassium: 4
Sodium: 139
TCO2: 30
pH, Ven: 7.415 — ABNORMAL HIGH

## 2011-08-14 LAB — DIFFERENTIAL
Eosinophils Absolute: 0.4
Eosinophils Relative: 4
Lymphocytes Relative: 19
Lymphs Abs: 1.9
Monocytes Relative: 8
Neutrophils Relative %: 69

## 2011-08-14 LAB — CBC
HCT: 39.8
MCV: 89.5
RBC: 4.44
WBC: 10.1

## 2011-08-14 LAB — POCT I-STAT CREATININE: Creatinine, Ser: 1.2

## 2011-10-08 ENCOUNTER — Other Ambulatory Visit: Payer: Self-pay | Admitting: Internal Medicine

## 2011-10-08 DIAGNOSIS — E785 Hyperlipidemia, unspecified: Secondary | ICD-10-CM

## 2011-10-08 DIAGNOSIS — E119 Type 2 diabetes mellitus without complications: Secondary | ICD-10-CM

## 2011-10-09 ENCOUNTER — Other Ambulatory Visit (INDEPENDENT_AMBULATORY_CARE_PROVIDER_SITE_OTHER): Payer: BC Managed Care – PPO

## 2011-10-09 DIAGNOSIS — E785 Hyperlipidemia, unspecified: Secondary | ICD-10-CM

## 2011-10-09 DIAGNOSIS — E119 Type 2 diabetes mellitus without complications: Secondary | ICD-10-CM

## 2011-10-09 LAB — LIPID PANEL
Cholesterol: 152 mg/dL (ref 0–200)
HDL: 36.2 mg/dL — ABNORMAL LOW (ref >39.00)
Triglycerides: 151 mg/dL — ABNORMAL HIGH (ref 0.0–149.0)
VLDL: 30.2 mg/dL (ref 0.0–40.0)

## 2011-10-09 LAB — AST: AST: 19 U/L (ref 0–37)

## 2011-10-09 NOTE — Progress Notes (Signed)
23

## 2011-10-14 ENCOUNTER — Encounter: Payer: Self-pay | Admitting: Internal Medicine

## 2011-10-16 ENCOUNTER — Ambulatory Visit (INDEPENDENT_AMBULATORY_CARE_PROVIDER_SITE_OTHER): Payer: BC Managed Care – PPO | Admitting: Internal Medicine

## 2011-10-16 ENCOUNTER — Encounter: Payer: Self-pay | Admitting: Internal Medicine

## 2011-10-16 DIAGNOSIS — E785 Hyperlipidemia, unspecified: Secondary | ICD-10-CM

## 2011-10-16 DIAGNOSIS — D126 Benign neoplasm of colon, unspecified: Secondary | ICD-10-CM

## 2011-10-16 DIAGNOSIS — E119 Type 2 diabetes mellitus without complications: Secondary | ICD-10-CM

## 2011-10-16 DIAGNOSIS — I10 Essential (primary) hypertension: Secondary | ICD-10-CM

## 2011-10-16 MED ORDER — METOPROLOL TARTRATE 25 MG PO TABS: 25.0000 mg | ORAL_TABLET | Freq: Two times a day (BID) | ORAL | Status: DC

## 2011-10-16 MED ORDER — SIMVASTATIN 40 MG PO TABS: 40.0000 mg | ORAL_TABLET | Freq: Every day | ORAL | Status: DC

## 2011-10-16 NOTE — Patient Instructions (Signed)
Eat a low-fat diet with lots of fruits and vegetables, up to 7-9 servings per day. Avoid obesity; your goal is waist measurement < 40 inches.Consume less than 40 grams of sugar per day from foods & drinks with High Fructose Corn Sugar as #1,2,3 or # 4 on label. Follow the low carb nutrition program in [employer] as closely as possible to prevent Diabetes progression & complications. White carbohydrates (potatoes, rice, bread, and pasta) have a high spike of sugar and a high load of sugar. For example a  baked potato has a cup of sugar and a  french fry  2 teaspoons of sugar. Yams, wild  rice, whole grained bread &  wheat pasta have been much lower spike and load of  sugar. Portions should be the size of a deck of cards or your palm.  Please  schedule fasting Labs in 6 months : BMET,Lipids, hepatic panel, CBC & dif, TSH, A1c, urine microalbumin. PLEASE BRING THESE INSTRUCTIONS TO FOLLOW UP  LAB APPOINTMENT.This will guarantee correct labs are drawn, eliminating need for repeat blood sampling ( needle sticks ! ). Diagnoses /Codes: 401.9,272.4,250.00,285.9

## 2011-10-16 NOTE — Progress Notes (Signed)
  Subjective:    Patient ID: Gregory Bates, male    DOB: 03-May-1941, 70 y.o.   MRN: [id number]  HPI HYPERTENSION: Disease Monitoring: Blood pressure -typically 125/80 on average  Chest pain, palpitations- no       Dyspnea- no Medications: Compliance- yes  Lightheadedness,Syncope- no    Edema- no  DIABETES: Disease Monitoring: Blood Sugar ranges-FBS average 115; highest 2 hr post meal < 170  Polyuria/phagia/dipsia- no       Visual problems- no; last Ophth 9/11, no retinopathy Podiatry exam- never; Diabetic shoes supplied by phone solicitation Medications: Compliance- yes  Hypoglycemic symptoms- no  HYPERLIPIDEMIA: Disease Monitoring: See symptoms for Hypertension Medications: Compliance- yes  Abd pain, bowel changes- no   Muscle aches- no  Smoking Status :quit 1988       Review of Systems he is inquiring about having a followup colonoscopy. He believes that his last colonoscopy was in 2006. He has a history of recurrent polyps. At this time he denies abdominal pain, rectal bleeding, melena, or weight loss. The EMR records state that he had a negative colonoscopy in 2007.     Objective:   Physical Exam Gen.: Healthy and well-nourished in appearance. Alert, appropriate and cooperative throughout exam. Appears younger than stated age.  Eyes: No corneal or conjunctival inflammation noted.Ptosis OS. Neck: No deformities, masses, or tenderness noted.  Thyroid small. Lungs: Normal respiratory effort; chest expands symmetrically. Lungs are clear to auscultation without rales, wheezes, or increased work of breathing.Decreased BS Heart: Normal rate and rhythm. Normal S1 and S2. No gallop, click, or rub. S 4 w/o  murmur.                                                                              Musculoskeletal/extremities: No clubbing, cyanosis, edema, or deformity noted. .Joints normal. Nail health  good. Vascular: Carotid, radial artery, dorsalis pedis and  posterior tibial  pulses are full and equal. No bruits present. Neurologic: Alert and oriented x3. Deep tendon reflexes symmetrical and normal.Light touch normal over feet          Skin: Intact without suspicious lesions or rashes. Lymph: No cervical, axillary  lymphadenopathy present. Psych: Mood and affect are normal. Normally interactive                                                                                         Assessment & Plan:

## 2011-10-16 NOTE — Assessment & Plan Note (Addendum)
He has no adverse effects from his medications by history. Liver enzymes are normal. LDL is at goal at 86. Ideally be less than 70. HDL is mildly  reduced at 40.9. Triglycerides are essentially high normal at 151. The most common cause of elevated triglycerides is the ingestion of sugar from high fructose corn syrup sources. You should consume less than 40 grams  of sugar per day from foods and drinks with high fructose corn syrup as number 1, 2, 3, or #4 on the label. As TG go up, HDL or good cholesterol goes down. Also uric acid which causes gout will go up.  To raise HDL or GOOD cholesterol include: exercising 30-45 minutes 3-4 X per week; include salmon & tuna in the diet;  & supplement with Omega 3 fatty acids (Flax or Fish oil )  1-2 grams per day. The B vitamin Niacin also raises HDL but has a vasodilating effect which may cause flushing.

## 2011-10-16 NOTE — Assessment & Plan Note (Signed)
Blood pressure is well controlled based on serial recordings. Renal function will be monitored with next labs. See progress note concerning review of systems. No change in medications

## 2011-10-16 NOTE — Assessment & Plan Note (Addendum)
As noted he is having no GI symptoms. He believes his last colonoscopy was in 2006, but the record states colonoscopy was negative in 2007. GI will be contacted to see if he is due for followup height polyp resected.

## 2011-10-16 NOTE — Assessment & Plan Note (Signed)
Ophthalmologic followup is overdue. He is never seen a podiatrist. As noted the diabetic shoes were sent after phone solicitation.  A1c is excellent at 6.5 with no hypoglycemia. No change will be  made in medicines. A1c should be checked in 6 months. He must report any hypoglycemic episodes immediately.

## 2011-12-03 ENCOUNTER — Other Ambulatory Visit: Payer: Self-pay | Admitting: *Deleted

## 2011-12-03 MED ORDER — CLOPIDOGREL BISULFATE 75 MG PO TABS: 75.0000 mg | ORAL_TABLET | Freq: Every day | ORAL | Status: DC

## 2011-12-21 ENCOUNTER — Other Ambulatory Visit: Payer: Self-pay | Admitting: Internal Medicine

## 2011-12-21 NOTE — Telephone Encounter (Signed)
BMET,Lipids, hepatic panel, CBC & dif, TSH, A1c, urine microalbumin 401.9/272.4/250.00/285.9

## 2012-05-13 ENCOUNTER — Encounter: Payer: Self-pay | Admitting: Cardiovascular Disease

## 2012-05-13 ENCOUNTER — Ambulatory Visit (INDEPENDENT_AMBULATORY_CARE_PROVIDER_SITE_OTHER): Payer: BC Managed Care – PPO | Admitting: Cardiovascular Disease

## 2012-05-13 VITALS — BP 130/59 | HR 72 | Ht 69.0 in | Wt 189.0 lb

## 2012-05-13 DIAGNOSIS — I10 Essential (primary) hypertension: Secondary | ICD-10-CM

## 2012-05-13 DIAGNOSIS — I2510 Atherosclerotic heart disease of native coronary artery without angina pectoris: Secondary | ICD-10-CM

## 2012-05-13 DIAGNOSIS — E785 Hyperlipidemia, unspecified: Secondary | ICD-10-CM

## 2012-05-13 MED ORDER — POTASSIUM CHLORIDE CRYS ER 20 MEQ PO TBCR: 20.0000 meq | EXTENDED_RELEASE_TABLET | Freq: Every day | ORAL | Status: DC

## 2012-05-13 MED ORDER — CLOPIDOGREL BISULFATE 75 MG PO TABS: 75.0000 mg | ORAL_TABLET | Freq: Every day | ORAL | Status: DC

## 2012-05-13 MED ORDER — SIMVASTATIN 40 MG PO TABS: 40.0000 mg | ORAL_TABLET | Freq: Every day | ORAL | Status: DC

## 2012-05-13 MED ORDER — NITROGLYCERIN 0.4 MG SL SUBL: 0.4000 mg | SUBLINGUAL_TABLET | SUBLINGUAL | Status: DC | PRN

## 2012-05-13 MED ORDER — METOPROLOL TARTRATE 25 MG PO TABS: 25.0000 mg | ORAL_TABLET | Freq: Two times a day (BID) | ORAL | Status: DC

## 2012-05-13 NOTE — Progress Notes (Signed)
History of Present Illness: 71 yo WM with history of DM, HTN, hyperlipidemia and CAD with admission to [name] 03/01/10 with NSTEMI. Cardiac cath on 03/03/10 with severe stenosis distal RCA and patent stent mid RCA. I placed a drug eluting stent in the area of severe stenosis in the distal RCA.   He has done well since then. No chest pain, SOB, palpitations, near syncope or syncope. He has not tolerated Crestor or Lipitor. He is tolerating his Zocor. Dr. Alwyn Bates is managing his lipids. 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 He tells me that he feels great.   Primary Care Physician: Gregory Bates  Last Lipid Profile:  Lipid Panel     Component Value Date/Time   CHOL 152 10/09/2011 0817   TRIG 151.0* 10/09/2011 0817   HDL 36.20* 10/09/2011 0817   CHOLHDL 4 10/09/2011 0817   VLDL 30.2 10/09/2011 0817   LDLCALC 86 10/09/2011 0817     Past Medical History  Diagnosis Date  . CAD (coronary artery disease), native coronary artery     s/p cath May 2011 with placement of DES distal RCA.  [name] Kitchen Hypertension   . Hyperlipidemia   . Diabetes mellitus   . Anemia, unspecified   . Colorectal polyps     recurrent  . Erectile dysfunction     Past Surgical History  Procedure Date  . Wisdom tooth extraction   . Colonoscopy w/ polypectomy 2002  . Infantile paralysis facial asymmetry   . Coronary stent placement 2007  . Colonoscopy w/ polypectomy 2004; 2007 negative    Current Outpatient Prescriptions  Medication Sig Dispense Refill  . aspirin 325 MG EC tablet Take 325 mg by mouth daily.        . calcium gluconate 500 MG tablet Take 500 mg by mouth daily.        . clopidogrel (PLAVIX) 75 MG tablet Take 1 tablet (75 mg total) by mouth daily.  30 tablet  6  . fexofenadine (ALLEGRA) 180 MG tablet Take 180 mg by mouth daily.        . fish oil-omega-3 fatty acids 1000 MG capsule Take 1 capsule daily.       [name] Kitchen glucose blood (ONE TOUCH ULTRA TEST) test strip 1 each by Other route as  needed. Use as instructed. Check blood sugar 1 time daily.       . metFORMIN (GLUCOPHAGE) 500 MG tablet TAKE 1 TABLET BY MOUTH TWICE DAILY WITH MEALS  180 tablet  1  . metoprolol tartrate (LOPRESSOR) 25 MG tablet Take 1 tablet (25 mg total) by mouth 2 (two) times daily.  180 tablet  3  . Multiple Vitamin (MULTIVITAMIN) tablet Take 1 tablet by mouth daily.        . nitroGLYCERIN (NITROSTAT) 0.4 MG SL tablet Place 0.4 mg under the tongue every 5 (five) minutes as needed.        [name] DELICA LANCETS MISC by Does not apply route. Check blood sugar 1 time daily.       . simvastatin (ZOCOR) 40 MG tablet Take 1 tablet (40 mg total) by mouth at bedtime.  90 tablet  1  . vitamin C (ASCORBIC ACID) 500 MG tablet Take 500 mg by mouth daily.        . vitamin E 400 UNIT capsule Take 400 Units by mouth daily.          No Known Allergies  History   Social History  . Marital Status: Married  Spouse Name: N/A    Number of Children: 1  . Years of Education: N/A   Occupational History  . maintenance     full time   Social History Main Topics  . Smoking status: Former Smoker    Quit date: 11/02/1984  . Smokeless tobacco: Not on file  . Alcohol Use: No  . Drug Use: No  . Sexually Active: Not on file   Other Topics Concern  . Not on file   Social History Narrative  . No narrative on file    Family History  Problem Relation Age of Onset  . Heart attack Brother 50  . Heart disease Brother     MI    Review of Systems:  As stated in the HPI and otherwise negative.   BP 130/59  Pulse 72  Ht 5\' 9"  (1.753 m)  Wt 189 lb (85.73 kg)  BMI 27.91 kg/m2  Physical Examination: General: Well developed, well nourished, NAD HEENT: OP clear, mucus membranes moist SKIN: warm, dry. No rashes. Neuro: No focal deficits Musculoskeletal: Muscle strength 5/5 all ext Psychiatric: Mood and affect normal Neck: No JVD, no carotid bruits, no thyromegaly, no lymphadenopathy. Lungs:Clear bilaterally,  no wheezes, rhonci, crackles Cardiovascular: Regular rate and rhythm. No murmurs, gallops or rubs. Abdomen:Soft. Bowel sounds present. Non-tender.  Extremities: No lower extremity edema. Pulses are 2 + in the bilateral DP/PT.

## 2012-05-13 NOTE — Assessment & Plan Note (Signed)
Stable. He is having no chest pains. Continue beta blocker, statin, lower ASA to 81 mg, continue Plavix. Will add KDur 20eq

## 2012-05-13 NOTE — Patient Instructions (Signed)
Your physician wants you to follow-up in:  12 months. You will receive a reminder letter in the mail two months in advance. If you don't receive a letter, please call our office to schedule the follow-up appointment.  Your physician has recommended you make the following change in your medication: Start K-Dur 20 meq by mouth daily.

## 2012-05-18 ENCOUNTER — Telehealth: Payer: Self-pay | Admitting: Internal Medicine

## 2012-05-18 NOTE — Telephone Encounter (Signed)
Message copied by Marshell Garfinkel on Wed May 18, 2012  9:40 AM ------      Message from: Pecola Lawless      Created: Sun May 15, 2012 12:26 PM       Please  schedule fasting Labs :A1c , urine microalbumin, BMET,Lipids, hepatic panel,  TSH, .272.4,995.20, 250.00. OV 2-3 days later.

## 2012-05-18 NOTE — Telephone Encounter (Signed)
Pt is scheduled for next week (05/24/12 and 05/27/12).

## 2012-05-24 ENCOUNTER — Other Ambulatory Visit (INDEPENDENT_AMBULATORY_CARE_PROVIDER_SITE_OTHER): Payer: BC Managed Care – PPO

## 2012-05-24 DIAGNOSIS — E119 Type 2 diabetes mellitus without complications: Secondary | ICD-10-CM

## 2012-05-24 DIAGNOSIS — E785 Hyperlipidemia, unspecified: Secondary | ICD-10-CM

## 2012-05-24 DIAGNOSIS — T887XXA Unspecified adverse effect of drug or medicament, initial encounter: Secondary | ICD-10-CM

## 2012-05-24 LAB — BASIC METABOLIC PANEL
BUN: 18 mg/dL (ref 6–23)
Calcium: 9.6 mg/dL (ref 8.4–10.5)
Chloride: 103 mEq/L (ref 96–112)
Creatinine, Ser: 1.2 mg/dL (ref 0.4–1.5)
Glucose, Bld: 113 mg/dL — ABNORMAL HIGH (ref 70–99)
Potassium: 4.4 mEq/L (ref 3.5–5.1)
Sodium: 138 mEq/L (ref 135–145)

## 2012-05-24 LAB — HEPATIC FUNCTION PANEL
Alkaline Phosphatase: 43 U/L (ref 39–117)
Bilirubin, Direct: 0.1 mg/dL (ref 0.0–0.3)
Total Bilirubin: 0.9 mg/dL (ref 0.3–1.2)
Total Protein: 7.2 g/dL (ref 6.0–8.3)

## 2012-05-24 LAB — MICROALBUMIN / CREATININE URINE RATIO: Creatinine,U: 30.2 mg/dL

## 2012-05-24 LAB — LIPID PANEL
Cholesterol: 159 mg/dL (ref 0–200)
LDL Cholesterol: 92 mg/dL (ref 0–99)
Total CHOL/HDL Ratio: 4
VLDL: 28.6 mg/dL (ref 0.0–40.0)

## 2012-05-24 NOTE — Progress Notes (Signed)
Labs only

## 2012-05-27 ENCOUNTER — Ambulatory Visit (INDEPENDENT_AMBULATORY_CARE_PROVIDER_SITE_OTHER): Payer: BC Managed Care – PPO | Admitting: Internal Medicine

## 2012-05-27 ENCOUNTER — Encounter: Payer: Self-pay | Admitting: Internal Medicine

## 2012-05-27 VITALS — BP 116/64 | HR 84 | Resp 12 | Wt 188.8 lb

## 2012-05-27 DIAGNOSIS — I10 Essential (primary) hypertension: Secondary | ICD-10-CM

## 2012-05-27 DIAGNOSIS — E785 Hyperlipidemia, unspecified: Secondary | ICD-10-CM

## 2012-05-27 DIAGNOSIS — E119 Type 2 diabetes mellitus without complications: Secondary | ICD-10-CM

## 2012-05-27 MED ORDER — GLUCOSE BLOOD VI STRP: ORAL_STRIP | Status: DC

## 2012-05-27 NOTE — Assessment & Plan Note (Signed)
Diabetes control is excellent. He should take a protein supplement midmorning and midafternoon to prevent hypoglycemia while working as needed. He does not wish to pursue  diabetic shoes at this time. Sensation and circulation are excellent in his feet. No deformities are present except for a single  toenail

## 2012-05-27 NOTE — Progress Notes (Signed)
Subjective:    Patient ID: Gregory Bates, male    DOB: Mar 30, 1941, 71 y.o.   MRN: [id number]  HPI  Diabetes status assessment: Fasting or morning glucose:  120 on  average  . Highest glucose 2 hours after any meal: no monitor. Hypoglycemia : rarely , activity related .                                                                                                                                                                                                                                                                                                                          Significant change in  Weight : no. Vision changes : no  .                                                                    Exercise : physically active on job . Nutrition/diet: no plan. Medication compliance :yes. Medication adverse  Effects:  no . Eye exam : 9/12: no retinopathy. Foot care : Dr Mat Carne sent form for diabetic shoes. Mr Devonshire says that he has never seen the gentleman.  A1c/ urine microalbumin monitor:  6.5%     HYPERTENSION: Disease Monitoring: Blood pressure range-not monitored  Medications: Compliance- yes  HYPERLIPIDEMIA: Disease Monitoring: See symptoms for Hypertension Medications: Compliance- yes Abd pain, bowel changes- no   Muscle aches- no           Review of Systems Excess thirst :no;  Excess hunger:  no ;  Excess urination:  no.                                  Lightheadedness with standing: no.  Chest pain:  no ; Palpitations :no ;  Pain in  calves with walking:  no .   Non healing skin  ulcers or sores,especially over the feet:  no. Numbness or tingling or burning in feet : no .    Dyspnea- no Edema- no     Objective:   Physical Exam Gen.: Healthy and well-nourished in appearance. Alert, appropriate and cooperative throughout exam.  Eyes: No corneal or conjunctival inflammation noted.  Mouth: Oral mucosa and oropharynx reveal no lesions or exudates.  Dentures. Neck: No deformities, masses, or tenderness noted.  Thyroid normal. Lungs: Normal respiratory effort; chest expands symmetrically. Lungs are clear to auscultation without rales, wheezes, or increased work of breathing. Heart: Normal rate and rhythm. Normal S1 and S2. No gallop, click, or rub. S4 with slurring at  LSB  Abdomen: Bowel sounds normal; abdomen soft and nontender. No masses, organomegaly or hernias noted.                                                                              Musculoskeletal/extremities:  No clubbing, cyanosis, edema, or deformity noted. Range of motion  normal .Tone & strength  normal.Joints normal. Nail health  good except for minor post traumatic changes of the right great toenail. Vascular: Carotid, radial artery, dorsalis pedis and  posterior tibial pulses are full and equal. No bruits present. Neurologic: Alert and oriented x3. Deep tendon reflexes symmetrical and normal.  Light touch normal over feet.        Skin: Intact without suspicious lesions or rashes. Lymph: No cervical, axillary  lymphadenopathy present. Psych: Mood and affect are normal. Normally interactive                                                                                         Assessment & Plan:

## 2012-05-27 NOTE — Assessment & Plan Note (Signed)
Blood pressure is well controlled and renal function is normal. No change indicated

## 2012-05-27 NOTE — Assessment & Plan Note (Signed)
LDL is at goal of less than 100. HDL is  minimally reduced. Interventions to increase HDL reviewed

## 2012-05-27 NOTE — Patient Instructions (Addendum)
Interventions to raise HDL or GOOD cholesterol include: exercising 30-45 minutes 3-4 X per week; including salmon & tuna in the diet;  & supplementing with Omega 3 fatty acids (Flax or Fish oil )  1-2 grams per day. The B vitamin Niacin also raises HDL but has a vasodilating effect which may cause flushing.  A1c assesses average 24 hour  glucose over prior 6-12 weeks. A1c GOALS:  Good diabetic control: 6.5-7 %  Fair diabetic control: 7-8 %  Poor diabetic control: greater than 8 % ( except with additional factors such as  advanced age; significant coronary or neurologic disease,etc). Check the A1c every 4-6  months as it is  6.5% or higher. Goals for home glucose monitoring are : fasting  or morning glucose goal of  100-150. Two hours after any meal , goal = < 180, preferably < 160. Report any low blood glucoses immediately.

## 2012-06-13 ENCOUNTER — Other Ambulatory Visit: Payer: Self-pay | Admitting: Internal Medicine

## 2012-08-23 ENCOUNTER — Other Ambulatory Visit: Payer: Self-pay

## 2012-08-23 NOTE — Telephone Encounter (Signed)
Patient and his wife share glucometer. The Glucometer was rx'ed for patient's wife Catering manager) patient uses the machine also. Patient's wife would like test strips and lancets sent to pharmacy in her husbands name for cost saving purposes (He has better coverage with insurance).  Dr.Hopper please advise. Please give specific instructions for testing BS

## 2012-08-23 NOTE — Telephone Encounter (Signed)
The prescription for test strips can only be in the name of the user ; unfortunately insurance companies will consider it fraud if the prescription is written in his name and they are actually  utilized by his wife. The insurance company would not hesitate to prosecute me. They can both have separate supplies. He should check the fasting sugars at least 3 times per week.

## 2012-08-25 NOTE — Telephone Encounter (Signed)
Spoke with patient's wife and she verbalized understanding of Dr.Hopper's response and concluded that she would like rx sent over in her name

## 2012-10-02 ENCOUNTER — Inpatient Hospital Stay (HOSPITAL_COMMUNITY): Payer: BC Managed Care – PPO

## 2012-10-02 ENCOUNTER — Emergency Department (HOSPITAL_COMMUNITY): Payer: BC Managed Care – PPO

## 2012-10-02 ENCOUNTER — Inpatient Hospital Stay (HOSPITAL_COMMUNITY)
Admission: EM | Admit: 2012-10-02 | Discharge: 2012-10-04 | DRG: 853 | Disposition: A | Discharge status: Home / Self Care | Payer: BC Managed Care – PPO | Attending: Cardiology | Admitting: Cardiology

## 2012-10-02 ENCOUNTER — Encounter (HOSPITAL_COMMUNITY): Payer: Self-pay | Admitting: Emergency Medicine

## 2012-10-02 ENCOUNTER — Encounter (HOSPITAL_COMMUNITY)
Admission: EM | Disposition: A | Discharge status: Home / Self Care | Payer: Self-pay | Source: Home / Self Care | Attending: Cardiology

## 2012-10-02 DIAGNOSIS — I2510 Atherosclerotic heart disease of native coronary artery without angina pectoris: Secondary | ICD-10-CM

## 2012-10-02 DIAGNOSIS — Z955 Presence of coronary angioplasty implant and graft: Secondary | ICD-10-CM

## 2012-10-02 DIAGNOSIS — Z79899 Other long term (current) drug therapy: Secondary | ICD-10-CM

## 2012-10-02 DIAGNOSIS — I4729 Other ventricular tachycardia: Secondary | ICD-10-CM | POA: Diagnosis present

## 2012-10-02 DIAGNOSIS — I999 Unspecified disorder of circulatory system: Secondary | ICD-10-CM | POA: Diagnosis present

## 2012-10-02 DIAGNOSIS — Z9861 Coronary angioplasty status: Secondary | ICD-10-CM

## 2012-10-02 DIAGNOSIS — Z8249 Family history of ischemic heart disease and other diseases of the circulatory system: Secondary | ICD-10-CM

## 2012-10-02 DIAGNOSIS — I213 ST elevation (STEMI) myocardial infarction of unspecified site: Secondary | ICD-10-CM

## 2012-10-02 DIAGNOSIS — I472 Ventricular tachycardia, unspecified: Secondary | ICD-10-CM | POA: Diagnosis present

## 2012-10-02 DIAGNOSIS — Z7982 Long term (current) use of aspirin: Secondary | ICD-10-CM

## 2012-10-02 DIAGNOSIS — Z860100 Personal history of colon polyps, unspecified: Secondary | ICD-10-CM

## 2012-10-02 DIAGNOSIS — E1159 Type 2 diabetes mellitus with other circulatory complications: Secondary | ICD-10-CM | POA: Diagnosis present

## 2012-10-02 DIAGNOSIS — Z87891 Personal history of nicotine dependence: Secondary | ICD-10-CM

## 2012-10-02 DIAGNOSIS — Z7902 Long term (current) use of antithrombotics/antiplatelets: Secondary | ICD-10-CM

## 2012-10-02 DIAGNOSIS — I10 Essential (primary) hypertension: Secondary | ICD-10-CM | POA: Diagnosis present

## 2012-10-02 DIAGNOSIS — E785 Hyperlipidemia, unspecified: Secondary | ICD-10-CM | POA: Diagnosis present

## 2012-10-02 DIAGNOSIS — I2582 Chronic total occlusion of coronary artery: Secondary | ICD-10-CM | POA: Diagnosis present

## 2012-10-02 DIAGNOSIS — I2119 ST elevation (STEMI) myocardial infarction involving other coronary artery of inferior wall: Principal | ICD-10-CM | POA: Diagnosis present

## 2012-10-02 DIAGNOSIS — E1169 Type 2 diabetes mellitus with other specified complication: Secondary | ICD-10-CM | POA: Diagnosis present

## 2012-10-02 HISTORY — PX: LEFT HEART CATHETERIZATION WITH CORONARY ANGIOGRAM: SHX5451

## 2012-10-02 HISTORY — PX: PERCUTANEOUS CORONARY STENT INTERVENTION (PCI-S): SHX5485

## 2012-10-02 LAB — COMPREHENSIVE METABOLIC PANEL WITH GFR
ALT: 16 U/L (ref 0–53)
AST: 20 U/L (ref 0–37)
Albumin: 3.9 g/dL (ref 3.5–5.2)
Alkaline Phosphatase: 56 U/L (ref 39–117)
BUN: 19 mg/dL (ref 6–23)
Potassium: 3.8 meq/L (ref 3.5–5.1)
Sodium: 137 meq/L (ref 135–145)
Total Protein: 6.9 g/dL (ref 6.0–8.3)

## 2012-10-02 LAB — COMPREHENSIVE METABOLIC PANEL
CO2: 22 mEq/L (ref 19–32)
Calcium: 9.5 mg/dL (ref 8.4–10.5)
Chloride: 100 mEq/L (ref 96–112)
Creatinine, Ser: 1.21 mg/dL (ref 0.50–1.35)
GFR calc Af Amer: 68 mL/min — ABNORMAL LOW (ref >90)
GFR calc non Af Amer: 58 mL/min — ABNORMAL LOW (ref >90)
Glucose, Bld: 158 mg/dL — ABNORMAL HIGH (ref 70–99)
Total Bilirubin: 0.3 mg/dL (ref 0.3–1.2)

## 2012-10-02 LAB — CBC
HCT: 37.1 % — ABNORMAL LOW (ref 39.0–52.0)
HCT: 36 % — ABNORMAL LOW (ref 39.0–52.0)
Hemoglobin: 12.5 g/dL — ABNORMAL LOW (ref 13.0–17.0)
Hemoglobin: 12.1 g/dL — ABNORMAL LOW (ref 13.0–17.0)
MCH: 29.3 pg (ref 26.0–34.0)
MCHC: 33.7 g/dL (ref 30.0–36.0)
MCV: 87.1 fL (ref 78.0–100.0)
Platelets: 220 K/uL (ref 150–400)
RBC: 4.26 MIL/uL (ref 4.22–5.81)
RDW: 12.8 % (ref 11.5–15.5)
RDW: 12.9 % (ref 11.5–15.5)
WBC: 12.6 10*3/uL — ABNORMAL HIGH (ref 4.0–10.5)
WBC: 12.7 10*3/uL — ABNORMAL HIGH (ref 4.0–10.5)

## 2012-10-02 LAB — POCT I-STAT, CHEM 8
BUN: 20 mg/dL (ref 6–23)
Creatinine, Ser: 1.2 mg/dL (ref 0.50–1.35)
Glucose, Bld: 151 mg/dL — ABNORMAL HIGH (ref 70–99)
Hemoglobin: 12.6 g/dL — ABNORMAL LOW (ref 13.0–17.0)
Potassium: 3.7 mEq/L (ref 3.5–5.1)
TCO2: 22 mmol/L (ref 0–100)

## 2012-10-02 LAB — APTT: aPTT: 165 s — ABNORMAL HIGH (ref 24–37)

## 2012-10-02 LAB — PROTIME-INR
INR: 1.16 (ref 0.00–1.49)
Prothrombin Time: 14.6 s (ref 11.6–15.2)

## 2012-10-02 LAB — CREATININE, SERUM
Creatinine, Ser: 1.06 mg/dL (ref 0.50–1.35)
GFR calc Af Amer: 80 mL/min — ABNORMAL LOW (ref >90)
GFR calc non Af Amer: 69 mL/min — ABNORMAL LOW (ref >90)

## 2012-10-02 LAB — TROPONIN I: Troponin I: <0.3 ng/mL (ref <0.30)

## 2012-10-02 SURGERY — LEFT HEART CATHETERIZATION WITH CORONARY ANGIOGRAM
Anesthesia: LOCAL

## 2012-10-02 MED ORDER — METOPROLOL TARTRATE 25 MG PO TABS
25.0000 mg | ORAL_TABLET | Freq: Two times a day (BID) | ORAL | Status: DC
  Administered 2012-10-02 – 2012-10-04 (×4): 25 mg via ORAL
  Filled 2012-10-02 (×7): qty 1

## 2012-10-02 MED ORDER — FENTANYL CITRATE 0.05 MG/ML IJ SOLN
INTRAMUSCULAR | Status: AC
  Filled 2012-10-02: qty 2

## 2012-10-02 MED ORDER — LIDOCAINE HCL (PF) 1 % IJ SOLN
INTRAMUSCULAR | Status: AC
  Filled 2012-10-02: qty 30

## 2012-10-02 MED ORDER — ATROPINE SULFATE 1 MG/ML IJ SOLN
INTRAMUSCULAR | Status: AC
  Filled 2012-10-02: qty 1

## 2012-10-02 MED ORDER — ONDANSETRON HCL 4 MG/2ML IJ SOLN: 4.0000 mg | Freq: Four times a day (QID) | INTRAMUSCULAR | Status: DC | PRN

## 2012-10-02 MED ORDER — CALCIUM GLUCONATE 500 MG PO TABS
500.0000 mg | ORAL_TABLET | Freq: Every day | ORAL | Status: DC
  Administered 2012-10-03: 500 mg via ORAL
  Filled 2012-10-02 (×3): qty 1

## 2012-10-02 MED ORDER — SODIUM CHLORIDE 0.9 % IV SOLN: INTRAVENOUS | Status: DC

## 2012-10-02 MED ORDER — HEPARIN SODIUM (PORCINE) 1000 UNIT/ML IJ SOLN
INTRAMUSCULAR | Status: AC
  Filled 2012-10-02: qty 1

## 2012-10-02 MED ORDER — NITROGLYCERIN IN D5W 200-5 MCG/ML-% IV SOLN
INTRAVENOUS | Status: AC
  Filled 2012-10-02: qty 250

## 2012-10-02 MED ORDER — TICAGRELOR 90 MG PO TABS
ORAL_TABLET | ORAL | Status: AC
  Administered 2012-10-03: 90 mg via ORAL
  Filled 2012-10-02: qty 2

## 2012-10-02 MED ORDER — HEPARIN SODIUM (PORCINE) 5000 UNIT/ML IJ SOLN
4000.0000 [IU] | INTRAMUSCULAR | Status: AC
  Administered 2012-10-02: 4000 [IU] via INTRAVENOUS

## 2012-10-02 MED ORDER — INSULIN ASPART 100 UNIT/ML ~~LOC~~ SOLN: 0.0000 [IU] | Freq: Every day | SUBCUTANEOUS | Status: DC

## 2012-10-02 MED ORDER — ZOLPIDEM TARTRATE 5 MG PO TABS: 5.0000 mg | ORAL_TABLET | Freq: Every evening | ORAL | Status: DC | PRN

## 2012-10-02 MED ORDER — TICAGRELOR 90 MG PO TABS
90.0000 mg | ORAL_TABLET | Freq: Two times a day (BID) | ORAL | Status: DC
  Administered 2012-10-03 – 2012-10-04 (×3): 90 mg via ORAL
  Filled 2012-10-02 (×5): qty 1

## 2012-10-02 MED ORDER — OMEGA-3 FATTY ACIDS 1000 MG PO CAPS: 1.0000 g | ORAL_CAPSULE | Freq: Every day | ORAL | Status: DC

## 2012-10-02 MED ORDER — CLOPIDOGREL BISULFATE 75 MG PO TABS: 75.0000 mg | ORAL_TABLET | Freq: Every day | ORAL | Status: DC

## 2012-10-02 MED ORDER — SODIUM CHLORIDE 0.9 % IV SOLN
0.2500 mg/kg/h | INTRAVENOUS | Status: DC
  Filled 2012-10-02: qty 250

## 2012-10-02 MED ORDER — OMEGA-3-ACID ETHYL ESTERS 1 G PO CAPS
1.0000 g | ORAL_CAPSULE | Freq: Every day | ORAL | Status: DC
  Administered 2012-10-03: 1 g via ORAL
  Filled 2012-10-02 (×2): qty 1

## 2012-10-02 MED ORDER — VERAPAMIL HCL 2.5 MG/ML IV SOLN
INTRAVENOUS | Status: AC
  Filled 2012-10-02: qty 2

## 2012-10-02 MED ORDER — NITROGLYCERIN 0.4 MG SL SUBL: 0.4000 mg | SUBLINGUAL_TABLET | SUBLINGUAL | Status: DC | PRN

## 2012-10-02 MED ORDER — VITAMIN C 500 MG PO TABS
500.0000 mg | ORAL_TABLET | Freq: Every day | ORAL | Status: DC
  Administered 2012-10-03: 500 mg via ORAL
  Filled 2012-10-02 (×3): qty 1

## 2012-10-02 MED ORDER — ACETAMINOPHEN 325 MG PO TABS: 650.0000 mg | ORAL_TABLET | ORAL | Status: DC | PRN

## 2012-10-02 MED ORDER — NITROGLYCERIN IN D5W 200-5 MCG/ML-% IV SOLN
10.0000 ug/min | INTRAVENOUS | Status: DC
  Administered 2012-10-02: 10 ug/min via INTRAVENOUS

## 2012-10-02 MED ORDER — INSULIN ASPART 100 UNIT/ML ~~LOC~~ SOLN
0.0000 [IU] | Freq: Three times a day (TID) | SUBCUTANEOUS | Status: DC
  Administered 2012-10-03 – 2012-10-04 (×4): 2 [IU] via SUBCUTANEOUS

## 2012-10-02 MED ORDER — HEPARIN (PORCINE) IN NACL 2-0.9 UNIT/ML-% IJ SOLN
INTRAMUSCULAR | Status: AC
  Filled 2012-10-02: qty 2000

## 2012-10-02 MED ORDER — BIVALIRUDIN 250 MG IV SOLR
INTRAVENOUS | Status: AC
  Filled 2012-10-02: qty 250

## 2012-10-02 MED ORDER — HEPARIN SODIUM (PORCINE) 5000 UNIT/ML IJ SOLN
5000.0000 [IU] | Freq: Three times a day (TID) | INTRAMUSCULAR | Status: DC
  Administered 2012-10-03 – 2012-10-04 (×4): 5000 [IU] via SUBCUTANEOUS
  Filled 2012-10-02 (×7): qty 1

## 2012-10-02 MED ORDER — ALPRAZOLAM 0.25 MG PO TABS: 0.2500 mg | ORAL_TABLET | Freq: Two times a day (BID) | ORAL | Status: DC | PRN

## 2012-10-02 MED ORDER — ONE-DAILY MULTI VITAMINS PO TABS: 1.0000 | ORAL_TABLET | Freq: Every day | ORAL | Status: DC

## 2012-10-02 MED ORDER — ACETAMINOPHEN 325 MG PO TABS
650.0000 mg | ORAL_TABLET | ORAL | Status: DC | PRN
  Filled 2012-10-02: qty 2

## 2012-10-02 MED ORDER — ALUM & MAG HYDROXIDE-SIMETH 200-200-20 MG/5ML PO SUSP
15.0000 mL | Freq: Once | ORAL | Status: AC
  Administered 2012-10-02: 15 mL via ORAL
  Filled 2012-10-02: qty 30

## 2012-10-02 MED ORDER — TICAGRELOR 90 MG PO TABS
90.0000 mg | ORAL_TABLET | Freq: Two times a day (BID) | ORAL | Status: DC
  Filled 2012-10-02: qty 1

## 2012-10-02 MED ORDER — ADULT MULTIVITAMIN W/MINERALS CH
1.0000 | ORAL_TABLET | Freq: Every day | ORAL | Status: DC
  Administered 2012-10-03: 1 via ORAL
  Filled 2012-10-02 (×2): qty 1

## 2012-10-02 MED ORDER — ONDANSETRON HCL 4 MG/2ML IJ SOLN
INTRAMUSCULAR | Status: AC
  Filled 2012-10-02: qty 2

## 2012-10-02 MED ORDER — LORATADINE 10 MG PO TABS
10.0000 mg | ORAL_TABLET | Freq: Every day | ORAL | Status: DC
  Administered 2012-10-03 – 2012-10-04 (×2): 10 mg via ORAL
  Filled 2012-10-02 (×3): qty 1

## 2012-10-02 MED ORDER — MIDAZOLAM HCL 2 MG/2ML IJ SOLN
INTRAMUSCULAR | Status: AC
  Filled 2012-10-02: qty 2

## 2012-10-02 MED ORDER — SIMVASTATIN 40 MG PO TABS
40.0000 mg | ORAL_TABLET | Freq: Every day | ORAL | Status: DC
  Administered 2012-10-02 – 2012-10-03 (×2): 40 mg via ORAL
  Filled 2012-10-02 (×5): qty 1

## 2012-10-02 MED ORDER — FENTANYL CITRATE 0.05 MG/ML IJ SOLN
INTRAMUSCULAR | Status: AC
  Administered 2012-10-02: 100 ug
  Filled 2012-10-02: qty 2

## 2012-10-02 MED ORDER — NITROGLYCERIN 0.2 MG/ML ON CALL CATH LAB
INTRAVENOUS | Status: AC
  Filled 2012-10-02: qty 1

## 2012-10-02 MED ORDER — VITAMIN E 180 MG (400 UNIT) PO CAPS
400.0000 [IU] | ORAL_CAPSULE | Freq: Every day | ORAL | Status: DC
  Filled 2012-10-02 (×2): qty 1

## 2012-10-02 MED ORDER — ASPIRIN 81 MG PO TABS: 81.0000 mg | ORAL_TABLET | Freq: Every day | ORAL | Status: DC

## 2012-10-02 MED ORDER — ASPIRIN EC 81 MG PO TBEC
81.0000 mg | DELAYED_RELEASE_TABLET | Freq: Every day | ORAL | Status: DC
  Administered 2012-10-03: 81 mg via ORAL
  Filled 2012-10-02 (×2): qty 1

## 2012-10-02 MED ORDER — SODIUM CHLORIDE 0.9 % IV SOLN: 1.0000 mL/kg/h | INTRAVENOUS | Status: DC

## 2012-10-02 MED ORDER — MORPHINE SULFATE 2 MG/ML IJ SOLN: 2.0000 mg | INTRAMUSCULAR | Status: DC | PRN

## 2012-10-02 NOTE — CV Procedure (Signed)
Cardiac Catheterization Procedure Note  Name: Gregory Bates MRN: [id number] DOB: 1940/12/24  Procedure: Left Heart Cath, Selective Coronary Angiography, LV angiography, PTCA and stenting of the distal RCA  Indication:71 year old white male with history of coronary disease. He is status post stenting of the mid RCA in 2007 with a bare-metal stent. He had stenting of the distal RCA just prior to the bifurcation with a drug-eluting stent in 2011. He presents now with an inferior STEMI with 2 mm of ST elevation in leads 2, 3, and aVF; 1 mm of ST elevation in leads V5 and V6. There was some delay in beginning the case due to availability of the cath lab.  Procedural Details:  The right wrist was prepped, draped, and anesthetized with 1% lidocaine. Using the modified Seldinger technique, a 6 French sheath was introduced into the right radial artery. 3 mg of verapamil was administered through the sheath, weight-based unfractionated heparin was administered intravenously. Standard Judkins catheters were used for selective coronary angiography and left ventriculography. Catheter exchanges were performed over an exchange length guidewire.  PROCEDURAL FINDINGS Hemodynamics: AO 129/68 with a mean of 95 mmHg LV 124/20 mmHg   Coronary angiography: Coronary dominance: right  Left mainstem: The left main coronary is moderately calcified. It is narrowed in the proximal vessel up to 20-30%.  Left anterior descending (LAD): The LAD is a small caliber vessel that extends midway onto the anterior wall. It gives rise to a moderately large first diagonal branch. The LAD is moderately calcified proximally with a 30% disease. It then tapers fairly quickly in the mid vessel before terminating in the distal anterior wall. The first diagonal has diffuse 50-60% disease in the proximal to mid vessel.  Left circumflex (LCx): The left circumflex gives rise to a large first marginal branch and then terminates with a  smaller marginal branch. The first obtuse marginal vessel has 50% narrowing at the ostium with 40-50% disease in the proximal and mid vessel. Following the takeoff of the first marginal there is a 60% stenosis in the ongoing circumflex.  Right coronary artery (RCA): The right coronary is a large dominant vessel. It is diffusely calcified. In the proximal vessel there is a 40% eccentric stenosis. The stent in the mid vessel is widely patent. The right coronary is tortuous in the mid vessel with diffuse irregularities up to 40%. The distal vessel is occluded with TIMI grade 0 flow.  Left ventriculography: Left ventricular systolic function is abnormal. There is mid to basal inferior wall hypokinesis. Overall ejection fraction is estimated at 50%. There is no significant mitral regurgitation   PCI Note:  Following the diagnostic procedure, the decision was made to proceed with PCI.  Weight-based bivalirudin was given for anticoagulation. Once a therapeutic ACT was achieved, a 6 [place] FR4 guide catheter was inserted.  A pro-water coronary guidewire was used to cross the lesion. This guide yielded poor support and we were unable to pass a balloon past the mid RCA. A right Amplatz guide also yielded very poor support. We switched to an IKARI right 1.0 guide which yielded moderately improved support. It was still quite difficult to pass a balloon to the lesion. We placed a guideliner and were only able to pass this into the very proximal RCA. Using a BMX buddy wire we were finally able to pass a 2.0 mm noncompliant balloon to the distal RCA.    With balloon inflation his perfusion was restored. There appeared to be a long segment of disease  involving the proximal portion of the prior stent. We next dilated the lesion with a 2.5 mm balloon up to 14 atmospheres. There was significant splitting of the plaque with focal intimal disruption.  The lesion was then stented with a  2.5 x 23 mm in Xience expedition  stent  overlapping with the prior stent.  This was deployed up to 18 atmospheres with the stent balloon which yielded a 2.75 mm diameter.    Following PCI, there was 0% residual stenosis and TIMI-3 flow. Final angiography confirmed an excellent result. At the end of the procedure the patient's pain had improved dramatically. His ST elevation had improved. He did experience a number of reperfusion arrhythmias during the case including accelerated idioventricular rhythms and PVCs. Patient was considerably uncomfortable throughout the procedure and had a very difficult time holding still for this difficult case.  There were no immediate procedural complications. A TR band was used for radial hemostasis. The patient was transferred to the post catheterization recovery area for further monitoring.  PCI Data: Vessel - RCA/Segment -  distal  Percent Stenosis (pre)   100% TIMI-flow 0  Stent  2.5 x 23 mm Xience expedition  Percent Stenosis (post)  less than 10%  TIMI-flow (post)  3   Final Conclusions:   1. Single vessel obstructive coronary artery disease.   2. Mild left ventricular dysfunction  3. Successful stenting of the distal RCA with a DES.  Recommendations:  Continue dual antiplatelet therapy for at least one year. Since the patient had been on chronic Plavix he was loaded with Brilinta in the lab pending results of P2 Y12 assay.   Theron Arista [facility] 10/02/2012, 7:28 PM

## 2012-10-02 NOTE — ED Provider Notes (Signed)
History     CSN: [id number]  Arrival date & time 10/02/12  1719   First MD Initiated Contact with Patient 10/02/12 1729      Chief Complaint  Patient presents with  . Code STEMI    (Consider location/radiation/quality/duration/timing/severity/associated sxs/prior treatment) HPI Comments: Pt with onset of chest pain with radiation to left arm a couple of hours ago, went to urgent care.  EMS called to there due to suspicion of cardiac, ECG suggested STEMI per EMS, given ASA and NTG en route.  Code STEMI alerted but pt arrived soon right after it was alerted.  Pt with MI in the past and feels similar.  Also weak, SOB, nausea.  Pt's cardiologist with [facility].  Level 5 caveat due to severity of symptoms and condition  The history is provided by the patient and the EMS personnel.    Past Medical History  Diagnosis Date  . CAD (coronary artery disease), native coronary artery     s/p cath May 2011 with placement of DES distal RCA.  [name] Kitchen Hypertension   . Hyperlipidemia   . Diabetes mellitus     AODM  . Anemia, unspecified   . Colorectal polyps     recurrent  . Erectile dysfunction     Past Surgical History  Procedure Date  . Wisdom tooth extraction   . Colonoscopy w/ polypectomy 2002  . Infantile paralysis facial asymmetry   . Coronary stent placement 2007, 2011  . Colonoscopy w/ polypectomy 2004; 2007 negative    Family History  Problem Relation Age of Onset  . Heart attack Brother 50  . Diabetes Neg Hx   . Stroke Neg Hx     History  Substance Use Topics  . Smoking status: Former Smoker    Quit date: 11/02/1984  . Smokeless tobacco: Never Used  . Alcohol Use: No      Review of Systems  Unable to perform ROS: Other    Allergies  Review of patient's allergies indicates no known allergies.  Home Medications   Current Outpatient Rx  Name  Route  Sig  Dispense  Refill  . ASPIRIN 81 MG PO TABS   Oral   Take 81 mg by mouth daily.         [name] Kitchen CALCIUM GLUCONATE  500 MG PO TABS   Oral   Take 500 mg by mouth daily.           [name] Kitchen CLOPIDOGREL BISULFATE 75 MG PO TABS   Oral   Take 1 tablet (75 mg total) by mouth daily.   30 tablet   11   . FEXOFENADINE HCL 180 MG PO TABS   Oral   Take 180 mg by mouth daily.           . OMEGA-3 FATTY ACIDS 1000 MG PO CAPS      Take 1 capsule daily.          [name] Kitchen GLUCOSE BLOOD VI STRP      Check blood sugar 1 time daily. DX: 250.00   50 each   3   . METFORMIN HCL 500 MG PO TABS      TAKE 1 TABLET BY MOUTH TWICE DAILY WITH MEALS   180 tablet   1   . METOPROLOL TARTRATE 25 MG PO TABS   Oral   Take 1 tablet (25 mg total) by mouth 2 (two) times daily.   60 tablet   11   . ONE-DAILY MULTI VITAMINS PO TABS   Oral  Take 1 tablet by mouth daily.           [name] Kitchen NITROGLYCERIN 0.4 MG SL SUBL   Sublingual   Place 1 tablet (0.4 mg total) under the tongue every 5 (five) minutes as needed.   25 tablet   6   . ONETOUCH DELICA LANCETS MISC   Does not apply   by Does not apply route. Check blood sugar 1 time daily.          [name] Kitchen POTASSIUM CHLORIDE CRYS ER 20 MEQ PO TBCR   Oral   Take 1 tablet (20 mEq total) by mouth daily.   30 tablet   11   . SIMVASTATIN 40 MG PO TABS   Oral   Take 1 tablet (40 mg total) by mouth at bedtime.   30 tablet   11   . VITAMIN C 500 MG PO TABS   Oral   Take 500 mg by mouth daily.           [name] Kitchen VITAMIN E 400 UNITS PO CAPS   Oral   Take 400 Units by mouth daily.             BP 151/77  Pulse 66  Resp 15  Ht 6' (1.829 m)  Wt 185 lb (83.915 kg)  BMI 25.09 kg/m2  SpO2 100%  Physical Exam  Nursing note and vitals reviewed. Constitutional: He appears well-developed and well-nourished. He appears distressed.  HENT:  Head: Normocephalic and atraumatic.  Eyes: EOM are normal. No scleral icterus.  Neck: Normal range of motion. Neck supple.  Cardiovascular: Normal rate.   No murmur heard. Pulmonary/Chest: Breath sounds normal. No accessory muscle usage.  Tachypnea noted. No respiratory distress. He has no wheezes.  Abdominal: Soft. He exhibits no distension. There is no tenderness.  Musculoskeletal: He exhibits no edema.  Neurological: He is alert.  Skin: Skin is warm. He is diaphoretic.  Psychiatric: He has a normal mood and affect.    ED Course  Procedures (including critical care time)   CRITICAL CARE Performed by: Lear Ng.   Total critical care time: 30 min  Critical care time was exclusive of separately billable procedures and treating other patients.  Critical care was necessary to treat or prevent imminent or life-threatening deterioration.  Critical care was time spent personally by me on the following activities: development of treatment plan with patient and/or surrogate as well as nursing, discussions with consultants, evaluation of patient's response to treatment, examination of patient, obtaining history from patient or surrogate, ordering and performing treatments and interventions, ordering and review of laboratory studies, ordering and review of radiographic studies, pulse oximetry and re-evaluation of patient's condition.   Labs Reviewed  APTT - Abnormal; Notable for the following:    aPTT 165 (*)     All other components within normal limits  CBC - Abnormal; Notable for the following:    WBC 12.6 (*)     Hemoglobin 12.5 (*)     HCT 37.1 (*)     All other components within normal limits  COMPREHENSIVE METABOLIC PANEL - Abnormal; Notable for the following:    Glucose, Bld 158 (*)     GFR calc non Af Amer 58 (*)     GFR calc Af Amer 68 (*)     All other components within normal limits  POCT I-STAT, CHEM 8 - Abnormal; Notable for the following:    Glucose, Bld 151 (*)     Hemoglobin 12.6 (*)  HCT 37.0 (*)     All other components within normal limits  PROTIME-INR  TROPONIN I  PLATELET INHIBITION P2Y12   No results found.   1. STEMI (ST elevation myocardial infarction)   2. CAD (coronary  artery disease)   3. HYPERTENSION   4. HYPERLIPIDEMIA    Sat on [place] O2 is 100% and I interpret to be normal.  ECG at time 17:34 shows STEMI with NSR at rate 66, normal axis, short PR at 108 ms, ST elevation inferiorly and reciprocal ST depression in I, AVL, V1, V2.    MDM  Pt with STEMI on ECG.  Pain began a few hours ago.  Brought by EMS from urgent care.  Cardiology team aware, cath team is on the way in.  Will give heparin bolus, analgesics.  Pt given NTG and ASA by EMS.          Gavin Pound. Eleftheria Taborn, MD 10/02/12 1900

## 2012-10-02 NOTE — H&P (Signed)
History and Physical   Patient ID: Gregory Bates MRN: [id number], DOB/AGE: [birth year] 72 y.o. Date of Encounter: 10/02/2012  Primary Physician: [name], MD Primary Cardiologist: CM  Chief Complaint:  STEMI  HPI: [name] is a 71 year old male with a history of CAD. He had onset of SSCP about 4:30 pm today. It was 10/10 and associated with N&V, SOB and diaphoresis. He went to Urgent Care and was given SL NTG and baby ASA x 2. EMS was called. His ECG was consistent with inferior STEMI. He was transported emergently to [facility] and is in the ER. He was given 2 more ASA 81 mg and additional NTG by EMS. In the ER, he was given Fentanyl, IV NTG, and Heparin. His pain is ongoing and severe.   Past Medical History  Diagnosis Date  . CAD (coronary artery disease), native coronary artery     s/p cath May 2011 with placement of DES distal RCA.  [name] Kitchen Hypertension   . Hyperlipidemia   . Diabetes mellitus     AODM  . Anemia, unspecified   . Colorectal polyps     recurrent  . Erectile dysfunction      Surgical History:  Past Surgical History  Procedure Date  . Wisdom tooth extraction   . Colonoscopy w/ polypectomy 2002  . Infantile paralysis facial asymmetry   . Coronary stent placement 2007, 2011  . Colonoscopy w/ polypectomy 2004; 2007 negative     I have reviewed the patient's current medications. Medication Sig  aspirin 81 MG tablet Take 81 mg by mouth daily.  calcium gluconate 500 MG tablet Take 500 mg by mouth daily.    clopidogrel (PLAVIX) 75 MG tablet Take 1 tablet (75 mg total) by mouth daily.  fexofenadine (ALLEGRA) 180 MG tablet Take 180 mg by mouth daily.    fish oil-omega-3 fatty acids 1000 MG capsule Take 1 capsule daily.   glucose blood (ONE TOUCH ULTRA TEST) test strip Check blood sugar 1 time daily. DX: 250.00  metFORMIN (GLUCOPHAGE) 500 MG tablet TAKE 1 TABLET BY MOUTH TWICE DAILY WITH MEALS  metoprolol tartrate (LOPRESSOR) 25 MG tablet Take 1 tablet (25 mg  total) by mouth 2 (two) times daily.  Multiple Vitamin (MULTIVITAMIN) tablet Take 1 tablet by mouth daily.    nitroGLYCERIN (NITROSTAT) 0.4 MG SL tablet Place 1 tablet (0.4 mg total) under the tongue every 5 (five) minutes as needed.  ONETOUCH DELICA LANCETS MISC by Does not apply route. Check blood sugar 1 time daily.   potassium chloride SA (K-DUR,KLOR-CON) 20 MEQ tablet Take 1 tablet (20 mEq total) by mouth daily.  simvastatin (ZOCOR) 40 MG tablet Take 1 tablet (40 mg total) by mouth at bedtime.  vitamin C (ASCORBIC ACID) 500 MG tablet Take 500 mg by mouth daily.    vitamin E 400 UNIT capsule Take 400 Units by mouth daily.      Scheduled Meds:    . [COMPLETED] bivalirudin      . fentaNYL      . [COMPLETED] heparin      . heparin  4,000 Units Intravenous STAT  . [COMPLETED] lidocaine      . [COMPLETED] nitroGLYCERIN      . [DISCONTINUED] nitroGLYCERIN       Continuous Infusions:    . sodium chloride    . nitroGLYCERIN     PRN Meds:.  Allergies: No Known Allergies  History   Social History  . Marital Status: Married    Spouse  Name: N/A    Number of Children: 1  . Years of Education: N/A   Occupational History  . maintenance     full time   Social History Main Topics  . Smoking status: Former Smoker    Quit date: 11/02/1984  . Smokeless tobacco: Never Used  . Alcohol Use: No  . Drug Use: No  . Sexually Active: Not on file   Other Topics Concern  . Not on file   Social History Narrative  . No narrative on file     Family History  Problem Relation Age of Onset  . Heart attack Brother 50  . Diabetes Neg Hx   . Stroke Neg Hx    Family Status  Relation Status Death Age  . Father Deceased 13    old age  . Mother Deceased 63    unknown reason    Review of Systems:   Full 14-point review of systems otherwise negative except as noted above.   Physical Exam: Blood pressure 151/77, pulse 66, resp. rate 15, height 6' (1.829 m), weight 185 lb (83.915  kg), SpO2 100.00%. General: Well developed, well nourished, male in no acute distress. Head: Normocephalic, atraumatic, sclera non-icteric, no xanthomas, nares are without discharge. Dentition: poor Neck: No carotid bruits. JVD not elevated. No thyromegally Lungs: Good expansion bilaterally. without wheezes or rhonchi. Few Rales bases Heart: Regular rate and rhythm with S1 S2.  No S3 or S4.  No murmur, no rubs, or gallops appreciated. Abdomen: Soft, non-tender, non-distended with normoactive bowel sounds. No hepatomegaly. No rebound/guarding. No obvious abdominal masses. Msk:  Strength and tone appear normal for age. No joint deformities or effusions Extremities: No clubbing or cyanosis. No edema.  Distal pedal pulses are 2+ in 4 extrem Neuro: Alert and oriented X 3. Moves all extremities spontaneously. No focal deficits noted. Psych:  Responds to questions appropriately with a flat affect due to pain. Skin: No rashes or lesions noted  Labs:   Lab Results  Component Value Date   WBC 12.6* 10/02/2012   HGB 12.5* 10/02/2012   HCT 37.1* 10/02/2012   MCV 87.1 10/02/2012   PLT 220 10/02/2012   No results found for this basename: INR in the last 72 hours No results found for this basename: NA,K,CL,CO2,BUN,CREATININE,CALCIUM,LABALBU,PROT,BILITOT,ALKPHOS,ALT,AST,GLUCOSE in the last 168 hours No results found for this basename: CKTOTAL:4,CKMB:4,TROPONINI:4 in the last 72 hours No results found for this basename: TROPIPOC:3 in the last 72 hours  Radiology/Studies:  No results found.   Cardiac Cath: see report, s/p DES distal RCA  ECG: SR, 66 bpm, Inferior ST elevation  ASSESSMENT AND PLAN:  Principal Problem:  *ST elevation myocardial infarction (STEMI) of inferior wall, initial episode of care Active Problems:  Type 2 diabetes mellitus with vascular disease  HYPERLIPIDEMIA  HYPERTENSION  CAD (coronary artery disease)  Pt ECG consistent Inferior STEMI. His care was delayed due to lab not  being available (another STEMI arrived 10 min before him). He is being transported urgently to the cath lab, for further evaluation and treatment. His metformin will be held but other meds continued. We will screen for cardiac risk factors.  SignedBjorn Loser Barrett PA-C 10/02/2012, 6:03 PM   Patient seen and examined and history reviewed. Agree with above findings and plan. 71 yo WM with prior stenting of the mid and distal RCA presents with an inferior STEMI with 2 mm ST elevation in leads 2,3,avf and 1 mm ST elevation in V5-6. Patient will be taken for emergent cardiac  cath and PCI.  Theron Arista [facility] 10/02/2012 7:46 PM

## 2012-10-02 NOTE — ED Notes (Signed)
Pt was seen at an urgent care in [place] today for cp. 12 lead at doc showed stemi. 2 sl nitro given at urgent care. ems gave 2 sl nitro and 4 baby asa. Placed 2 PIV.

## 2012-10-03 DIAGNOSIS — I2510 Atherosclerotic heart disease of native coronary artery without angina pectoris: Secondary | ICD-10-CM

## 2012-10-03 DIAGNOSIS — I219 Acute myocardial infarction, unspecified: Secondary | ICD-10-CM

## 2012-10-03 LAB — BASIC METABOLIC PANEL
CO2: 24 mEq/L (ref 19–32)
Chloride: 101 mEq/L (ref 96–112)
Creatinine, Ser: 1.03 mg/dL (ref 0.50–1.35)
GFR calc Af Amer: 82 mL/min — ABNORMAL LOW (ref >90)
Potassium: 4 mEq/L (ref 3.5–5.1)
Sodium: 135 mEq/L (ref 135–145)

## 2012-10-03 LAB — POCT I-STAT, CHEM 8
BUN: 18 mg/dL (ref 6–23)
Chloride: 105 mEq/L (ref 96–112)
Sodium: 137 mEq/L (ref 135–145)

## 2012-10-03 LAB — CBC
MCV: 86.1 fL (ref 78.0–100.0)
Platelets: 206 10*3/uL (ref 150–400)
RBC: 4.18 MIL/uL — ABNORMAL LOW (ref 4.22–5.81)
RDW: 12.9 % (ref 11.5–15.5)
WBC: 12.9 10*3/uL — ABNORMAL HIGH (ref 4.0–10.5)

## 2012-10-03 LAB — GLUCOSE, CAPILLARY
Glucose-Capillary: 150 mg/dL — ABNORMAL HIGH (ref 70–99)
Glucose-Capillary: 124 mg/dL — ABNORMAL HIGH (ref 70–99)

## 2012-10-03 LAB — LIPID PANEL
Cholesterol: 136 mg/dL (ref 0–200)
HDL: 36 mg/dL — ABNORMAL LOW (ref >39)
LDL Cholesterol: 81 mg/dL (ref 0–99)
Triglycerides: 95 mg/dL (ref <150)

## 2012-10-03 LAB — TROPONIN I
Troponin I: >20 ng/mL (ref <0.30)
Troponin I: >20 ng/mL (ref <0.30)

## 2012-10-03 LAB — POCT ACTIVATED CLOTTING TIME: Activated Clotting Time: 364 seconds

## 2012-10-03 LAB — HEMOGLOBIN A1C: Mean Plasma Glucose: 151 mg/dL — ABNORMAL HIGH (ref <117)

## 2012-10-03 MED ORDER — GUAIFENESIN-DM 100-10 MG/5ML PO SYRP: 5.0000 mL | ORAL_SOLUTION | ORAL | Status: DC | PRN

## 2012-10-03 NOTE — Progress Notes (Addendum)
Cardiology Progress Note Patient Name: Gregory Bates Date of Encounter: 10/03/2012, 6:51 AM     Subjective  Denies chest pain or sob. Asymptomatic NSVT on telemetry.    Objective   Telemetry: sinus rhythm 70-80s, 3-13 beats NSVT  Medications: . [COMPLETED] alum & mag hydroxide-simeth  15 mL Oral Once  . aspirin EC  81 mg Oral Daily  . [COMPLETED] atropine      . [COMPLETED] bivalirudin      . bivalirudin (ANGIOMAX) infusion 5 mg/mL (Cath Lab,ACS,PCI indication)  0.25 mg/kg/hr Intravenous To Cath  . calcium gluconate  500 mg Oral Daily  . [COMPLETED] fentaNYL      . [COMPLETED] fentaNYL      . [COMPLETED] heparin      . [COMPLETED] heparin      . [COMPLETED] heparin  4,000 Units Intravenous STAT  . heparin  5,000 Units Subcutaneous Q8H  . insulin aspart  0-15 Units Subcutaneous TID WC  . insulin aspart  0-5 Units Subcutaneous QHS  . [COMPLETED] lidocaine      . loratadine  10 mg Oral Daily  . metoprolol tartrate  25 mg Oral BID  . [COMPLETED] midazolam      . multivitamin with minerals  1 tablet Oral Daily  . [COMPLETED] nitroGLYCERIN      . omega-3 acid ethyl esters  1 g Oral Daily  . [COMPLETED] ondansetron      . simvastatin  40 mg Oral QHS  . [COMPLETED] Ticagrelor      . Ticagrelor  90 mg Oral BID  . [COMPLETED] verapamil      . vitamin C  500 mg Oral Daily  . vitamin E  400 Units Oral Daily  . [DISCONTINUED] aspirin  81 mg Oral Daily  . [DISCONTINUED] clopidogrel  75 mg Oral Daily  . [DISCONTINUED] fish oil-omega-3 fatty acids  1 g Oral Daily  . [DISCONTINUED] multivitamin  1 tablet Oral Daily  . [DISCONTINUED] nitroGLYCERIN      . [DISCONTINUED] Ticagrelor  90 mg Oral BID     Physical Exam: Temp:  [98.5 F (36.9 C)-98.8 F (37.1 C)] 98.5 F (36.9 C) (12/02 0400) Pulse Rate:  [66-96] 76  (12/02 0600) Resp:  [11-18] 15  (12/02 0400) BP: (103-151)/(51-77) 119/65 mmHg (12/02 0600) SpO2:  [90 %-100 %] 91 % (12/02 0600) Weight:  [184 lb 15.5 oz (83.9  kg)-190 lb 0.6 oz (86.2 kg)] 190 lb 0.6 oz (86.2 kg) (12/02 0500)  General: Elderly white male, in no acute distress. Head: Normocephalic, atraumatic, sclera non-icteric, nares are without discharge.  Neck: Supple. Negative for carotid bruits or JVD Lungs: Clear bilaterally to auscultation without wheezes, rales, or rhonchi. Breathing is unlabored. Heart: RRR S1 S2 without murmurs, rubs, or gallops.  Abdomen: Soft, non-tender, non-distended with normoactive bowel sounds. No rebound/guarding. No obvious abdominal masses. Msk:  Strength and tone appear normal for age. Extremities: Right wrist without hematoma. No edema. No clubbing or cyanosis. Distal pedal pulses are intact and equal bilaterally. Neuro: Alert and oriented X 3. Moves all extremities spontaneously. Psych:  Responds to questions appropriately with a normal affect.   Intake/Output Summary (Last 24 hours) at 10/03/12 0651 Last data filed at 10/03/12 0407  Gross per 24 hour  Intake   1032 ml  Output    550 ml  Net    482 ml    Labs:  Basename 10/03/12 0305 10/02/12 2019 10/02/12 1802 10/02/12 1733  NA 135 -- 139 --  K 4.0 -- 3.7 --  CL 101 -- 104 --  CO2 24 -- -- 22  GLUCOSE 152* -- 151* --  BUN 16 -- 20 --  CREATININE 1.03 1.06 -- --  CALCIUM 8.8 -- -- 9.5   Basename 10/02/12 1733  AST 20  ALT 16  ALKPHOS 56  BILITOT 0.3  PROT 6.9  ALBUMIN 3.9   Basename 10/03/12 0305 10/02/12 2019  WBC 12.9* 12.7*  HGB 12.2* 12.1*  HCT 36.0* 36.0*  MCV 86.1 87.2  PLT 206 209   Basename 10/03/12 0305 10/02/12 2134 10/02/12 1733  TROPONINI >20.00* >20.00* <0.30    Radiology/Studies:   10/02/2012 - PORTABLE CHEST - 1 VIEW  Findings: Diffuse interstitial infiltrates are seen, consistent with pulmonary edema.  Mild cardiomegaly noted.  No evidence of pulmonary consolidation or definite pleural effusion.  IMPRESSION: Acute congestive heart failure.    10/02/12 - Cardiac Cath Hemodynamics:  AO 129/68 with a mean of 95  mmHg  LV 124/20 mmHg  Coronary angiography:  Coronary dominance: right  Left mainstem: The left main coronary is moderately calcified. It is narrowed in the proximal vessel up to 20-30%.  Left anterior descending (LAD): The LAD is a small caliber vessel that extends midway onto the anterior wall. It gives rise to a moderately large first diagonal branch. The LAD is moderately calcified proximally with a 30% disease. It then tapers fairly quickly in the mid vessel before terminating in the distal anterior wall. The first diagonal has diffuse 50-60% disease in the proximal to mid vessel.  Left circumflex (LCx): The left circumflex gives rise to a large first marginal branch and then terminates with a smaller marginal branch. The first obtuse marginal vessel has 50% narrowing at the ostium with 40-50% disease in the proximal and mid vessel. Following the takeoff of the first marginal there is a 60% stenosis in the ongoing circumflex.  Right coronary artery (RCA): The right coronary is a large dominant vessel. It is diffusely calcified. In the proximal vessel there is a 40% eccentric stenosis. The stent in the mid vessel is widely patent. The right coronary is tortuous in the mid vessel with diffuse irregularities up to 40%. The distal vessel is occluded with TIMI grade 0 flow.  Left ventriculography: Left ventricular systolic function is abnormal. There is mid to basal inferior wall hypokinesis. Overall ejection fraction is estimated at 50%. There is no significant mitral regurgitation  PCI Data:  Vessel - RCA/Segment - distal  Percent Stenosis (pre) 100%  TIMI-flow 0  Stent 2.5 x 23 mm Xience expedition  Percent Stenosis (post) less than 10%  TIMI-flow (post) 3  Final Conclusions:  1. Single vessel obstructive coronary artery disease.  2. Mild left ventricular dysfunction  3. Successful stenting of the distal RCA with a DES.  Recommendations: Continue dual antiplatelet therapy for at least one  year. Since the patient had been on chronic Plavix he was loaded with Brilinta in the lab pending results of P2 Y12 assay.     Assessment and Plan   1. Acute Inferior STEMI/CAD: Cath showed single vessel obstructive CAD of the distal RCA treated with DES. EF 50% by cath. EKG shows improvement in ST elevation. No further chest pain. P2Y12 elevated at 255. Plavix changed to Brilinta. Cont ASA, BB, statin.   2. NSVT: 3-13 beats NSVT overnight. Asymptomatic and hemodynamically stable. Cont BB.  3. Diabetes Mellitus, Type 2: Metformin on hold. CBGs ok. A1c pending. Cont SSI  4. Hypertension: BP stable.  5. Hyperlipidemia: LDL 92 in July. On 40mg  simvastatin. Lipid panel pending this morning. Consider changing to more potent statin if LDL > 70.  Signed, HOPE, JESSICA PA-C  I have personally seen and examined this patient with [facility], PA-C.  I agree with the assessment and plan as outlined above. He is doing well this am. Will continue current meds. He admits to non-compliance with Plavix (only taking 1/2 tablet per day) and PRU 255. He was switched to Brilinta BID after load yesterday. He is not sure about cost. Will transfer to floor today. Hopeful d/c in am.   Gregory Bates 9:25 AM 10/03/2012

## 2012-10-03 NOTE — Progress Notes (Signed)
CARDIAC REHAB PHASE I   PRE:  Rate/Rhythm: 77SR  BP:  Supine:   Sitting: 116/59  Standing:    SaO2: 91%2L, 88%RA  MODE:  Ambulation: 350 ft   POST:  Rate/Rhythem: 93SR  BP:  Supine:   Sitting: 135/58  Standing:    SaO2: 87%RA, 89-91% 2L  SATURATION QUALIFICATIONS: (This note is used to comply with regulatory documentation for home oxygen)  Patient Saturations on Room Air at Rest = 88%  Patient Saturations on Room Air while Ambulating = 87%  Patient Saturations on 2 Liters of oxygen while Ambulating = 89-91%  Please briefly explain why patient needs home oxygen: desat on RA  1420-1308 Pt states he feels as if his sinuses are stopped up but does not feel SOB. Sats as documented. No c/o CP. Tolerated well except for the desat. To recliner after walk. Left on 2L. Education completed except diet. Left diet sheets. Encouraged Phase 2. Pt declined due to work schedule. Encouraged pt to increase walking from 15-20 mins daily to 30 minutes. Gave ex ed to do this gradually.  Will follow up tomorrow.  Duanne Limerick

## 2012-10-03 NOTE — Progress Notes (Signed)
Have attempted twice to wean from 2L nasal cannula to room air without success.   Oxygen saturations on room air drop to 85-86.  Assessment otherwise unchanged.  Will continue to monitor pt closely.

## 2012-10-03 NOTE — Care Management Note (Signed)
    Page 1 of 1   10/03/2012     9:17:55 AM   CARE MANAGEMENT NOTE 10/03/2012  Patient:  Gregory Bates, Gregory Bates   Account Number:  [id number]  Date Initiated:  10/03/2012  Documentation initiated by:  Gregory Bates  Subjective/Objective Assessment:   adm w mi     Action/Plan:   lives w wife, pcp dr Chrissie Noa hopper   Anticipated DC Date:     Anticipated DC Plan:  HOME/SELF CARE      DC Planning Services  CM consult      Choice offered to / List presented to:             Status of service:   Medicare Important Message given?   (If response is "NO", the following Medicare IM given date fields will be blank) Date Medicare IM given:   Date Additional Medicare IM given:    Discharge Disposition:    Per UR Regulation:  Reviewed for med. necessity/level of care/duration of stay  If discussed at Long Length of Stay Meetings, dates discussed:    Comments:  12/2 9:15a Gregory Gregory Bjelland rn,bsn [phone] will give pt 30day free brilinta card and copay assist card.

## 2012-10-03 NOTE — Progress Notes (Signed)
Chaplain immediately responded to Cath Lab after receiving a page. Patient was en route from ED. Chaplain made contact with patient and family in ED B14. Patient was awake, alert but in a lot of pain. Chaplain assured patient and family member of his spiritual and emotional support. Chaplain provided ministry of presence and empathetic listening to family members before, during and after procedure in the Cath lab. Chaplain served as Print production planner between the [facility] and family members. Chaplain shared words of encouragement and hope with family. Family thanked Chaplain for the support and presence.

## 2012-10-04 LAB — GLUCOSE, CAPILLARY: Glucose-Capillary: 125 mg/dL — ABNORMAL HIGH (ref 70–99)

## 2012-10-04 MED ORDER — TICAGRELOR 90 MG PO TABS: 90.0000 mg | ORAL_TABLET | Freq: Two times a day (BID) | ORAL | Status: DC

## 2012-10-04 NOTE — Discharge Summary (Signed)
See full note this am. cdm 

## 2012-10-04 NOTE — Discharge Summary (Signed)
CARDIOLOGY DISCHARGE SUMMARY   Patient ID: Gregory Bates MRN: [id number] DOB/AGE: [birth year] 72 y.o.  Admit date: 10/02/2012 Discharge date: 10/04/2012  Primary Discharge Diagnosis:   *ST elevation myocardial infarction (STEMI) of inferior wall, initial episode of care - s/p 2.5 x 23 mm Xience expedition stent to the RCA  Secondary Discharge Diagnosis:   Type 2 diabetes mellitus with vascular disease  HYPERLIPIDEMIA  HYPERTENSION  CAD (coronary artery disease)  Procedures: Left Heart Cath, Selective Coronary Angiography, LV angiography, PTCA and stenting of the distal RCA  Hospital Course: [name] is a 71 year old male with a remote history of coronary artery disease. He had chest pain which was severe and unrelenting. He went to urgent care and his ECG was consistent with an inferior STEMI. He was transported to [facility] and taken to the Cath Lab.  Full cardiac catheterization results are below. He had a drug-eluting stent to the RCA with no procedural complications. He had accelerated idioventricular rhythm post-procedure but was asymptomatic with this and maintained his blood pressure well. His potassium was 4.0 so no supplement was needed. He was continued on his home dose of beta blocker and the appendectomy are essentially resolved.  A hemoglobin A1c was checked and was 6.9. He was started on sliding scale insulin but his metformin was held. Compliance with a diabetic diet is encouraged and he is to follow up with primary care. A lipid profile was also checked and showed a slightly low HDL but his LDL was acceptable. He was continued on his home dose of Zocor and this can be reassessed as an outpatient.  He was seen by cardiac rehabilitation and ambulated with them. His room air oxygenation dropped slightly during the walk but he was asymptomatic with this. He will be offered outpatient cardiac rehabilitation as well.  On 10/04/2012, [name] was seen by Dr. Clifton James and by  cardiac rehabilitation. He was ambulating without chest pain or shortness of breath and considered stable for discharge, to follow up as an outpatient.   Labs:   Lab Results  Component Value Date   WBC 12.9* 10/03/2012   HGB 12.2* 10/03/2012   HCT 36.0* 10/03/2012   MCV 86.1 10/03/2012   PLT 206 10/03/2012    Lab 10/03/12 0305 10/02/12 1733  NA 135 --  K 4.0 --  CL 101 --  CO2 24 --  BUN 16 --  CREATININE 1.03 --  CALCIUM 8.8 --  PROT -- 6.9  BILITOT -- 0.3  ALKPHOS -- 56  ALT -- 16  AST -- 20  GLUCOSE 152* --    Basename 10/03/12 0908 10/03/12 0305 10/02/12 2134  CKTOTAL -- -- --  CKMB -- -- --  CKMBINDEX -- -- --  TROPONINI >20.00* >20.00* >20.00*   Lipid Panel     Component Value Date/Time   CHOL 136 10/03/2012 0305   TRIG 95 10/03/2012 0305   HDL 36* 10/03/2012 0305   CHOLHDL 3.8 10/03/2012 0305   VLDL 19 10/03/2012 0305   LDLCALC 81 10/03/2012 0305    Basename 10/02/12 1733  INR 1.16   Lab Results  Component Value Date   HGBA1C 6.9* 10/03/2012       Radiology: Dg Chest Portable 1 View 10/02/2012  *RADIOLOGY REPORT*  Clinical Data: Acute myocardial infarct.  PORTABLE CHEST - 1 VIEW  Comparison: 03/01/2010  Findings: Diffuse interstitial infiltrates are seen, consistent with pulmonary edema.  Mild cardiomegaly noted.  No evidence of pulmonary consolidation or definite pleural effusion.  IMPRESSION: Acute congestive heart failure.   Original Report Authenticated By: Myles Rosenthal, M.D.     Cardiac Cath: 10/02/2012 Left mainstem: The left main coronary is moderately calcified. It is narrowed in the proximal vessel up to 20-30%.  Left anterior descending (LAD): The LAD is a small caliber vessel that extends midway onto the anterior wall. It gives rise to a moderately large first diagonal branch. The LAD is moderately calcified proximally with a 30% disease. It then tapers fairly quickly in the mid vessel before terminating in the distal anterior wall. The first diagonal  has diffuse 50-60% disease in the proximal to mid vessel.  Left circumflex (LCx): The left circumflex gives rise to a large first marginal branch and then terminates with a smaller marginal branch. The first obtuse marginal vessel has 50% narrowing at the ostium with 40-50% disease in the proximal and mid vessel. Following the takeoff of the first marginal there is a 60% stenosis in the ongoing circumflex.  Right coronary artery (RCA): The right coronary is a large dominant vessel. It is diffusely calcified. In the proximal vessel there is a 40% eccentric stenosis. The stent in the mid vessel is widely patent. The right coronary is tortuous in the mid vessel with diffuse irregularities up to 40%. The distal vessel is occluded with TIMI grade 0 flow.  Left ventriculography: Left ventricular systolic function is abnormal. There is mid to basal inferior wall hypokinesis. Overall ejection fraction is estimated at 50%. There is no significant mitral regurgitation  PCI Data:  Vessel - RCA/Segment - distal  Percent Stenosis (pre) 100%  TIMI-flow 0  Stent 2.5 x 23 mm Xience expedition  Percent Stenosis (post) less than 10%  TIMI-flow (post) 3   EKG: 04-Oct-2012 04:34:46 [facility]-20 ROUTINE RECORD Normal sinus rhythm Inferior infarct , age undetermined ST & T wave abnormality, consider lateral ischemia Abnormal ECG 8mm/s 64mm/mV 100Hz  8.0.1 12SL 241 HD CID: 1 Referred by: Unconfirmed Vent. rate 92 BPM PR interval 138 ms QRS duration 86 ms QT/QTc 376/464 ms P-R-T axes 69 18 -89  FOLLOW UP PLANS AND APPOINTMENTS No Known Allergies   Medication List     As of 10/04/2012  9:44 AM    STOP taking these medications         clopidogrel 75 MG tablet   Commonly known as: PLAVIX      TAKE these medications         aspirin 81 MG tablet   Take 81 mg by mouth daily.      calcium gluconate 500 MG tablet   Take 500 mg by mouth daily.      fexofenadine 180 MG tablet   Commonly  known as: ALLEGRA   Take 180 mg by mouth daily.      fish oil-omega-3 fatty acids 1000 MG capsule   Take 1 capsule daily.      metFORMIN 500 MG tablet   Commonly known as: GLUCOPHAGE   Take 500 mg by mouth 2 (two) times daily with a meal.      metoprolol tartrate 25 MG tablet   Commonly known as: LOPRESSOR   Take 1 tablet (25 mg total) by mouth 2 (two) times daily.      multivitamin tablet   Take 1 tablet by mouth daily.      nitroGLYCERIN 0.4 MG SL tablet   Commonly known as: NITROSTAT   Place 1 tablet (0.4 mg total) under the tongue every 5 (five) minutes as needed.  potassium chloride SA 20 MEQ tablet   Commonly known as: K-DUR,KLOR-CON   Take 1 tablet (20 mEq total) by mouth daily.      simvastatin 40 MG tablet   Commonly known as: ZOCOR   Take 1 tablet (40 mg total) by mouth at bedtime.      Ticagrelor 90 MG Tabs tablet   Commonly known as: BRILINTA   Take 1 tablet (90 mg total) by mouth 2 (two) times daily.      vitamin C 500 MG tablet   Commonly known as: ASCORBIC ACID   Take 500 mg by mouth daily.      vitamin E 400 UNIT capsule   Take 400 Units by mouth daily.            Discharge Orders    Future Appointments: Provider: Department: Dept Phone: Center:   10/20/2012 4:15 PM Kathleene Hazel, MD [facility] Main Office [place]) [phone] [id number]     Future Orders Please Complete By Expires   Diet - low sodium heart healthy      Diet Carb Modified      Increase activity slowly        Follow-up Information    Follow up with [facility], MD. On 10/20/2012. (4:15 pm.)    Contact information:   [address] [place] [address] [phone]          BRING ALL MEDICATIONS WITH YOU TO FOLLOW UP APPOINTMENTS  Time spent with patient to include physician time: 36 min Signed: Theodore Demark 10/04/2012, 9:44 AM Co-Sign MD

## 2012-10-04 NOTE — Progress Notes (Signed)
SUBJECTIVE: No chest pain or SOB. Ambulating on room air and no SOB.   BP 126/53  Pulse 90  Temp 98.4 F (36.9 C) (Oral)  Resp 18  Ht 6' (1.829 m)  Wt 187 lb 9.6 oz (85.095 kg)  BMI 25.44 kg/m2  SpO2 92%  Intake/Output Summary (Last 24 hours) at 10/04/12 6213 Last data filed at 10/03/12 1700  Gross per 24 hour  Intake   1200 ml  Output    775 ml  Net    425 ml    PHYSICAL EXAM General: Well developed, well nourished, in no acute distress. Alert and oriented x 3.  Psych:  Good affect, responds appropriately Neck: No JVD. No masses noted.  Lungs: Clear bilaterally with no wheezes or rhonci noted.  Heart: RRR with no murmurs noted. Abdomen: Bowel sounds are present. Soft, non-tender.  Extremities: No lower extremity edema.   LABS: Basic Metabolic Panel:  Basename 10/03/12 0305 10/02/12 2019 10/02/12 1820 10/02/12 1733  NA 135 -- 137 --  K 4.0 -- 3.7 --  CL 101 -- 105 --  CO2 24 -- -- 22  GLUCOSE 152* -- 174* --  BUN 16 -- 18 --  CREATININE 1.03 1.06 -- --  CALCIUM 8.8 -- -- 9.5  MG -- -- -- --  PHOS -- -- -- --   CBC:  Basename 10/03/12 0305 10/02/12 2019  WBC 12.9* 12.7*  NEUTROABS -- --  HGB 12.2* 12.1*  HCT 36.0* 36.0*  MCV 86.1 87.2  PLT 206 209   Cardiac Enzymes:  Basename 10/03/12 0908 10/03/12 0305 10/02/12 2134  CKTOTAL -- -- --  CKMB -- -- --  CKMBINDEX -- -- --  TROPONINI >20.00* >20.00* >20.00*   Fasting Lipid Panel:  Basename 10/03/12 0305  CHOL 136  HDL 36*  LDLCALC 81  TRIG 95  CHOLHDL 3.8  LDLDIRECT --    Current Meds:    . aspirin EC  81 mg Oral Daily  . calcium gluconate  500 mg Oral Daily  . heparin  5,000 Units Subcutaneous Q8H  . insulin aspart  0-15 Units Subcutaneous TID WC  . insulin aspart  0-5 Units Subcutaneous QHS  . loratadine  10 mg Oral Daily  . metoprolol tartrate  25 mg Oral BID  . multivitamin with minerals  1 tablet Oral Daily  . omega-3 acid ethyl esters  1 g Oral Daily  . simvastatin  40 mg  Oral QHS  . Ticagrelor  90 mg Oral BID  . vitamin C  500 mg Oral Daily  . [DISCONTINUED] bivalirudin (ANGIOMAX) infusion 5 mg/mL (Cath Lab,ACS,PCI indication)  0.25 mg/kg/hr Intravenous To Cath  . [DISCONTINUED] vitamin E  400 Units Oral Daily     ASSESSMENT AND PLAN:   1. Acute Inferior STEMI/CAD: Cath with occluded distal RCA treated with DES. EF 50% by cath.  No further chest pain. P2Y12 elevated at 255 but patient admitted to only taking 1/2 of Plavix each day. Plavix changed to Brilinta. Cont ASA, BB, statin.   2. Diabetes Mellitus, Type 2: Metformin on hold. CBGs ok. A1c pending. Cont SSI. Resume metformin tomorrow.  3. Hypertension: BP stable.   4. Hyperlipidemia: Continue statin.  5. Dispo: D/c home today. New medication is Brilinta. He will need the starter packet. Also needs note for work to stay out of work for rest of this week and then have two weeks of light duty. Follow up in office in two weeks with me.    MCALHANY,CHRISTOPHER  12/3/20137:21 AM

## 2012-10-04 NOTE — Progress Notes (Addendum)
CARDIAC REHAB PHASE I   PRE:  Rate/Rhythm: 99 SR  BP:  Supine:   Sitting: 100/50  Standing:    SaO2: 93 RA  MODE:  Ambulation: 890 ft   POST:  Rate/Rhythem: 114  BP:  Supine:   Sitting: 136/50  Standing:    SaO2: 89-91 RA 1610-9604 Pt tolerated ambulation well without c/o of cp or SOB. Room air sats 89-91% during and after walk. Pt to side of bed after walk. Completed discharge education with pt. He voices undersanding.After further discussion with pt he agrees to Outpt. CRP in [place]. I will send referral.  Beatrix Fetters

## 2012-10-20 ENCOUNTER — Encounter: Payer: Self-pay | Admitting: *Deleted

## 2012-10-20 ENCOUNTER — Ambulatory Visit (INDEPENDENT_AMBULATORY_CARE_PROVIDER_SITE_OTHER): Payer: BC Managed Care – PPO | Admitting: Cardiovascular Disease

## 2012-10-20 ENCOUNTER — Encounter: Payer: Self-pay | Admitting: Cardiovascular Disease

## 2012-10-20 VITALS — BP 136/56 | HR 73 | Ht 72.0 in | Wt 188.0 lb

## 2012-10-20 DIAGNOSIS — I2510 Atherosclerotic heart disease of native coronary artery without angina pectoris: Secondary | ICD-10-CM

## 2012-10-20 NOTE — Progress Notes (Signed)
History of Present Illness: 71 yo WM with history of DM, HTN, hyperlipidemia and CAD with admission to [facility] 03/01/10 with NSTEMI and recent admission to [facility]  10/02/12 with inferior STEMI and found to have occluded RCA treated with DES. He did well and was discharged on 10/04/12.    He has done well since discharge. No chest pain, SOB, palpitations, near syncope or syncope. He has not tolerated Crestor or Lipitor. He is tolerating his Zocor. Dr. Alwyn Ren is managing his lipids. 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 He is walking 1 mile per day.   Primary Care Physician: Alwyn Ren  Last Lipid Profile:Lipid Panel     Component Value Date/Time   CHOL 136 10/03/2012 0305   TRIG 95 10/03/2012 0305   HDL 36* 10/03/2012 0305   CHOLHDL 3.8 10/03/2012 0305   VLDL 19 10/03/2012 0305   LDLCALC 81 10/03/2012 0305     Past Medical History  Diagnosis Date  . CAD (coronary artery disease), native coronary artery     s/p cath May 2011 with placement of DES distal RCA.  [name] Kitchen Hypertension   . Hyperlipidemia   . Diabetes mellitus     AODM  . Anemia, unspecified   . Colorectal polyps     recurrent  . Erectile dysfunction     Past Surgical History  Procedure Date  . Wisdom tooth extraction   . Colonoscopy w/ polypectomy 2002  . Infantile paralysis facial asymmetry   . Coronary stent placement 2007, 2011  . Colonoscopy w/ polypectomy 2004; 2007 negative    Current Outpatient Prescriptions  Medication Sig Dispense Refill  . aspirin 81 MG tablet Take 81 mg by mouth daily.      . calcium gluconate 500 MG tablet Take 500 mg by mouth daily.        . fexofenadine (ALLEGRA) 180 MG tablet Take 180 mg by mouth daily.        . fish oil-omega-3 fatty acids 1000 MG capsule Take 1 capsule daily.       . metFORMIN (GLUCOPHAGE) 500 MG tablet Take 500 mg by mouth 2 (two) times daily with a meal.      . metoprolol tartrate (LOPRESSOR) 25 MG tablet Take 1 tablet (25 mg total) by  mouth 2 (two) times daily.  60 tablet  11  . Multiple Vitamin (MULTIVITAMIN) tablet Take 1 tablet by mouth daily.        . potassium chloride SA (K-DUR,KLOR-CON) 20 MEQ tablet Take 1 tablet (20 mEq total) by mouth daily.  30 tablet  11  . simvastatin (ZOCOR) 40 MG tablet Take 1 tablet (40 mg total) by mouth at bedtime.  30 tablet  11  . Ticagrelor (BRILINTA) 90 MG TABS tablet Take 1 tablet (90 mg total) by mouth 2 (two) times daily.  60 tablet  11  . vitamin C (ASCORBIC ACID) 500 MG tablet Take 500 mg by mouth daily.        . vitamin E 400 UNIT capsule Take 400 Units by mouth daily.        . nitroGLYCERIN (NITROSTAT) 0.4 MG SL tablet Place 1 tablet (0.4 mg total) under the tongue every 5 (five) minutes as needed.  25 tablet  6    No Known Allergies  History   Social History  . Marital Status: Married    Spouse Name: N/A    Number of Children: 1  . Years of Education: N/A   Occupational History  .  maintenance     full time   Social History Main Topics  . Smoking status: Former Smoker    Quit date: 11/02/1984  . Smokeless tobacco: Never Used  . Alcohol Use: No  . Drug Use: No  . Sexually Active: Not on file   Other Topics Concern  . Not on file   Social History Narrative  . No narrative on file    Family History  Problem Relation Age of Onset  . Heart attack Brother 50  . Diabetes Neg Hx   . Stroke Neg Hx     Review of Systems:  As stated in the HPI and otherwise negative.   BP 136/56  Pulse 73  Ht 6' (1.829 m)  Wt 188 lb (85.276 kg)  BMI 25.50 kg/m2  Physical Examination: General: Well developed, well nourished, NAD HEENT: OP clear, mucus membranes moist SKIN: warm, dry. No rashes. Neuro: No focal deficits Musculoskeletal: Muscle strength 5/5 all ext Psychiatric: Mood and affect normal Neck: No JVD, no carotid bruits, no thyromegaly, no lymphadenopathy. Lungs:Clear bilaterally, no wheezes, rhonci, crackles Cardiovascular: Regular rate and rhythm. No  murmurs, gallops or rubs. Abdomen:Soft. Bowel sounds present. Non-tender.  Extremities: No lower extremity edema. Pulses are 2 + in the bilateral DP/PT.   Assessment and Plan:   1. CAD: Recent STEMI. DES placed RCA. He is on ASA and Brilinta. Doing well. Tolerating statin. No changes today. LV function preserved. He does not wish to participate

## 2012-10-20 NOTE — Patient Instructions (Addendum)
Your physician wants you to follow-up in: 4 months. You will receive a reminder letter in the mail two months in advance. If you don't receive a letter, please call our office to schedule the follow-up appointment.  

## 2012-11-07 ENCOUNTER — Telehealth (HOSPITAL_COMMUNITY): Payer: Self-pay | Admitting: Cardiac Rehabilitation

## 2012-11-07 NOTE — Telephone Encounter (Signed)
pc to schedule pt for outpatient cardiac rehab.  Pt wife states pt is not interested.  He is exercising on his own.  Dr. Clifton James made aware. Pt wife informed pt is eligible to participate in cardiac rehab up to 6-12 months post STEMI.  Understanding verbalized

## 2012-12-05 ENCOUNTER — Telehealth: Payer: Self-pay | Admitting: Cardiovascular Disease

## 2012-12-05 NOTE — Telephone Encounter (Signed)
Pt's phone number for today=[phone]

## 2012-12-05 NOTE — Telephone Encounter (Signed)
Pt states he wants to stop the Brilinta.  He states he is having terrible body aches from it.  He states it lasts most of the day and then starts up again in the evening.  He states it started when he started the Brilinta but has been getting progressively worse.  He wants to go back on the Plavix.  He states he was only taking one-half tab of the Plavix due to bruising but is willing to take a whole tab if he needs to.

## 2012-12-05 NOTE — Telephone Encounter (Signed)
Pt was notified and agrees.  He will call back in two weeks with a progress report.

## 2012-12-05 NOTE — Telephone Encounter (Signed)
He was tested in the hospital and Plavix is not effective for him. He has been labeled a "non-responder" to Plavix. With recent MI, not safe to go back on Plavix. I would recommend stopping the statin if he is having diffuse body aches. Try two weeks off of the statin and then call back to let us know how he is doing. If he stops Brilinta on his own, this could lead to stent thrombosis and death. Another option down the road would be to try Effient in place of Brilinta but Plavix is not a good option. Thanks, chris

## 2012-12-05 NOTE — Telephone Encounter (Signed)
New Problem:    Patient called in because he is experiencing total body soreness while taking Ticagrelor (BRILINTA) 90 MG TABS tablet and would like to switch back to taking Plavix.  Please call back.

## 2012-12-19 ENCOUNTER — Telehealth: Payer: Self-pay | Admitting: Cardiovascular Disease

## 2012-12-19 MED ORDER — CLOPIDOGREL BISULFATE 75 MG PO TABS: 75.0000 mg | ORAL_TABLET | Freq: Every day | ORAL | Status: DC

## 2012-12-19 NOTE — Telephone Encounter (Signed)
Pt called today because he has been having join, all over his body pain, dizziness, and SOB. Pt states he stop taken the Simvastatin like the MD said to see if the pain got better, instead the pain is getting worse. Pt states that he  can't even move without having pain. Pt states is not taken any more of the Brilinta 90 mg because of  the side effects. Pt would like to go back taken the simvastatin and the Clopidogrel  Medication like before it worked. Pt states will start taken the Plavix today  because he has about 6 pills left from before.

## 2012-12-19 NOTE — Telephone Encounter (Signed)
Can we refill his Plavix 75 mg po Qdaily (clopidogrel) and tell him to resume simvastatin. THanks, chris

## 2012-12-19 NOTE — Telephone Encounter (Signed)
Pt aware to restart Plavix and Simvastatin. A prescription for Clopidogrel 75 mg once a day (plavix)send electronically to pt's pharmacy; pt aware.

## 2012-12-19 NOTE — Telephone Encounter (Signed)
Pt is aware that is OKAY to stop taken Brilinta.

## 2012-12-19 NOTE — Telephone Encounter (Signed)
New PRoblem:    PAtient called in wanting to stop his Ticagrelor (BRILINTA) 90 MG TABS tablet because he is experiencing joint pain and dizziness.  Patient wishes to be placed on the generic of Plavix.  Please call back.

## 2013-01-09 ENCOUNTER — Encounter: Payer: Self-pay | Admitting: Internal Medicine

## 2013-01-09 ENCOUNTER — Ambulatory Visit (INDEPENDENT_AMBULATORY_CARE_PROVIDER_SITE_OTHER): Payer: BC Managed Care – PPO | Admitting: Internal Medicine

## 2013-01-09 VITALS — BP 138/80 | HR 59 | Temp 98.1°F | Wt 187.0 lb

## 2013-01-09 DIAGNOSIS — E1159 Type 2 diabetes mellitus with other circulatory complications: Secondary | ICD-10-CM

## 2013-01-09 DIAGNOSIS — I798 Other disorders of arteries, arterioles and capillaries in diseases classified elsewhere: Secondary | ICD-10-CM

## 2013-01-09 DIAGNOSIS — M25519 Pain in unspecified shoulder: Secondary | ICD-10-CM

## 2013-01-09 DIAGNOSIS — M25511 Pain in right shoulder: Secondary | ICD-10-CM

## 2013-01-09 LAB — MICROALBUMIN / CREATININE URINE RATIO: Microalb Creat Ratio: 1.2 mg/g (ref 0.0–30.0)

## 2013-01-09 NOTE — Patient Instructions (Addendum)
Review and correct the record as indicated. Please share record with all medical staff seen.  

## 2013-01-09 NOTE — Progress Notes (Signed)
Subjective:    Patient ID: Gregory Bates, male    DOB: [birth year], 72 y.o.   MRN: [id number]  HPI Diabetes status assessment: Fasting or morning glucose range 128-135 Highest glucose 2 hours after any meal is <180. No hypoglycemia reported                                                                                                                 Regular exercise described as walking 1 mpd over 20  minutes. No specific nutrition/diet followed Medication compliance is good. Medication adverse effect noted to Brilinta as arthralgias & myalgias. Eye exam current. Foot care not current  A1c 6.9% on 10/03/12  (average sugar 151with long-term  38 %  risk ) .  He describes the myalgias and arthralgias has diffuse probe from the top of his head. The symptoms did not improve when he is statin was discontinued but did improve once the Brilinta was discontinued. He now describes residual pain in both shoulders  is described as sharp  up to level 8. The pain does not radiate. The discomfort last seconds . It is exacerbated by position change @ night & elevation of arms. It resolves by 12 noon. There is no associated  redness ,swelling, skin color change, or  temperature change The pain was  treated with Bufferin ; response was partial .               Review of Systems Constitutional: no fever, chills, sweats  Musculoskeletal:no  muscle cramps or pain now Heme:no lymphadenopathy; abnormal bruising or bleeding   No excess thirst ;  excess hunger ; or excess urination reported                              No lightheadedness with standing reported No chest pain ; palpitations ; claudication described .                                                                                                                             No non healing skin  ulcers or sores of extremities noted. R posterior thigh numbness w/o tingling after driving > 30 min. No burning in feet described  No significant change in weight  No blurred,double, or loss of vision reported  .      Objective:   Physical Exam Gen.: well-nourished in appearance. Alert, appropriate and cooperative throughout exam. Eyes: No corneal or conjunctival inflammation noted.  Neck: No deformities, masses, or tenderness noted.  Thyroid normal. Lungs: Normal respiratory effort; chest expands symmetrically. Lungs are clear to auscultation without rales, wheezes, or increased work of breathing. Heart: Normal rate and rhythm. Normal S1 and S2. No gallop, click, or rub. Grade 1/6 systolic murmur R base. Abdomen: Bowel sounds normal; abdomen soft and nontender. No masses, organomegaly or hernias noted.                        Musculoskeletal/extremities: There is some asymmetry of the posterior thoracic musculature suggesting occult scoliosis. No clubbing, cyanosis, edema, or significant extremity  deformity noted. Range of motion normal .Tone & strength  Normal. Joints  reveal minor  DJD DIP changes. Nail health good except minor changes R great toe. Subjective bilateral shoulder pain with elevation of arms above head Able to lie down & sit up w/o help.  Vascular: Carotid, radial artery pulses are full and equal. Decreased dorsalis pedis and  posterior tibial pulses.No bruits present. Neurologic: Alert and oriented x3. Deep tendon reflexes symmetrical and normal.         Skin: Intact without suspicious lesions or rashes. Lymph: No cervical, axillary lymphadenopathy present. Psych: Mood and affect are normal. Normally interactive                                                                                        Assessment & Plan:  #1 diabetes; fasting glucose is suggests excellent control. A1c goal will be less than 8%  #2 bilateral shoulder pain. The history suggests possible rheumatoid  variant. Polymyalgia rheumatica is not suggested as the symptoms are not constant. RA and sedimentation rate will be checked.  The residual shoulder pain is definitely not related to the Brilinta.

## 2013-01-10 LAB — RHEUMATOID FACTOR: Rheumatoid fact SerPl-aCnc: 12 [IU]/mL

## 2013-02-17 ENCOUNTER — Emergency Department (HOSPITAL_COMMUNITY): Payer: BC Managed Care – PPO

## 2013-02-17 ENCOUNTER — Inpatient Hospital Stay (HOSPITAL_COMMUNITY)
Admission: EM | Admit: 2013-02-17 | Discharge: 2013-02-21 | DRG: 853 | Disposition: A | Discharge status: Home / Self Care | Payer: BC Managed Care – PPO | Attending: Cardiology | Admitting: Cardiology

## 2013-02-17 DIAGNOSIS — R7989 Other specified abnormal findings of blood chemistry: Secondary | ICD-10-CM

## 2013-02-17 DIAGNOSIS — Z7982 Long term (current) use of aspirin: Secondary | ICD-10-CM

## 2013-02-17 DIAGNOSIS — I999 Unspecified disorder of circulatory system: Secondary | ICD-10-CM | POA: Diagnosis present

## 2013-02-17 DIAGNOSIS — E1159 Type 2 diabetes mellitus with other circulatory complications: Secondary | ICD-10-CM | POA: Diagnosis present

## 2013-02-17 DIAGNOSIS — R778 Other specified abnormalities of plasma proteins: Secondary | ICD-10-CM

## 2013-02-17 DIAGNOSIS — R079 Chest pain, unspecified: Secondary | ICD-10-CM

## 2013-02-17 DIAGNOSIS — Z7902 Long term (current) use of antithrombotics/antiplatelets: Secondary | ICD-10-CM

## 2013-02-17 DIAGNOSIS — I10 Essential (primary) hypertension: Secondary | ICD-10-CM | POA: Diagnosis present

## 2013-02-17 DIAGNOSIS — Z9861 Coronary angioplasty status: Secondary | ICD-10-CM

## 2013-02-17 DIAGNOSIS — I200 Unstable angina: Secondary | ICD-10-CM

## 2013-02-17 DIAGNOSIS — I252 Old myocardial infarction: Secondary | ICD-10-CM

## 2013-02-17 DIAGNOSIS — Z860100 Personal history of colon polyps, unspecified: Secondary | ICD-10-CM

## 2013-02-17 DIAGNOSIS — I249 Acute ischemic heart disease, unspecified: Secondary | ICD-10-CM

## 2013-02-17 DIAGNOSIS — E785 Hyperlipidemia, unspecified: Secondary | ICD-10-CM | POA: Diagnosis present

## 2013-02-17 DIAGNOSIS — I2510 Atherosclerotic heart disease of native coronary artery without angina pectoris: Secondary | ICD-10-CM

## 2013-02-17 DIAGNOSIS — Z79899 Other long term (current) drug therapy: Secondary | ICD-10-CM

## 2013-02-17 DIAGNOSIS — I214 Non-ST elevation (NSTEMI) myocardial infarction: Principal | ICD-10-CM

## 2013-02-17 DIAGNOSIS — E1169 Type 2 diabetes mellitus with other specified complication: Secondary | ICD-10-CM | POA: Diagnosis present

## 2013-02-17 LAB — CBC WITH DIFFERENTIAL/PLATELET
Eosinophils Relative: 3 % (ref 0–5)
HCT: 35.1 % — ABNORMAL LOW (ref 39.0–52.0)
Lymphocytes Relative: 16 % (ref 12–46)
Lymphs Abs: 1.6 10*3/uL (ref 0.7–4.0)
MCV: 82.4 fL (ref 78.0–100.0)
Platelets: 180 10*3/uL (ref 150–400)
RBC: 4.26 MIL/uL (ref 4.22–5.81)
WBC: 10.5 10*3/uL (ref 4.0–10.5)

## 2013-02-17 LAB — BASIC METABOLIC PANEL
CO2: 29 mEq/L (ref 19–32)
Calcium: 9.8 mg/dL (ref 8.4–10.5)
Chloride: 102 mEq/L (ref 96–112)
Glucose, Bld: 135 mg/dL — ABNORMAL HIGH (ref 70–99)
Sodium: 140 mEq/L (ref 135–145)

## 2013-02-17 LAB — POCT I-STAT TROPONIN I

## 2013-02-17 MED ORDER — HEPARIN BOLUS VIA INFUSION: 4000.0000 [IU] | Freq: Once | INTRAVENOUS | Status: DC

## 2013-02-17 MED ORDER — NITROGLYCERIN 2 % TD OINT
1.0000 [in_us] | TOPICAL_OINTMENT | Freq: Once | TRANSDERMAL | Status: AC
  Administered 2013-02-17: 1 [in_us] via TOPICAL
  Filled 2013-02-17: qty 1

## 2013-02-17 MED ORDER — ASPIRIN 81 MG PO CHEW
324.0000 mg | CHEWABLE_TABLET | Freq: Once | ORAL | Status: AC
  Administered 2013-02-17: 324 mg via ORAL
  Filled 2013-02-17: qty 4

## 2013-02-17 MED ORDER — HEPARIN (PORCINE) IN NACL 100-0.45 UNIT/ML-% IJ SOLN
1250.0000 [IU]/h | INTRAMUSCULAR | Status: DC
  Administered 2013-02-17: 1100 [IU]/h via INTRAVENOUS
  Administered 2013-02-18 – 2013-02-19 (×2): 1250 [IU]/h via INTRAVENOUS
  Filled 2013-02-17 (×6): qty 250

## 2013-02-17 NOTE — H&P (Signed)
Cardiology History and Physical  [name], MD  History of Present Illness (and review of medical records): Gregory Bates is a 72 y.o. male who presents for evaluation of chest pain.  Of note he has known hx of CAD with last event being inferior STEMI 10/2012 where he underwent DES to RCA.  He has had no problems in the interim.  He has been on ASA and plavix.   He developed chest pressure, 2-3/10 around 6pm this afternoon.  This occurred after eating dinner and he felt like his was indigestion.  He took an Catering manager and ASA.  Pain lasted for around .  His wife urged him to come to ED for further evaluation.  He was found to have positive troponin. He is currently chest pain free.  Cardiology was called for admission.  Review of Systems Further review of systems was otherwise negative other than stated in HPI.  Patient Active Problem List   Diagnosis Date Noted  . NSTEMI (non-ST elevated myocardial infarction) 02/17/2013  . ST elevation myocardial infarction (STEMI) of inferior wall, initial episode of care 10/02/2012  . CAD (coronary artery disease) 10/02/2012  . Type 2 diabetes mellitus with vascular disease 09/18/2010  . HYPERTENSION 03/28/2010  . COLONIC POLYPS, RECURRENT 04/26/2008  . HYPERLIPIDEMIA 10/07/2007  . ERECTILE DYSFUNCTION 10/07/2007   Past Medical History  Diagnosis Date  . CAD (coronary artery disease), native coronary artery     s/p cath May 2011 with placement of DES distal RCA.  [name] Kitchen Hypertension   . Hyperlipidemia   . Diabetes mellitus     AODM  . Anemia, unspecified   . Colorectal polyps     recurrent  . Erectile dysfunction     Past Surgical History  Procedure Laterality Date  . Wisdom tooth extraction    . Colonoscopy w/ polypectomy  2002  . Infantile paralysis facial asymmetry    . Coronary stent placement  2007, 2011  . Colonoscopy w/ polypectomy  2004; 2007 negative    Prescriptions prior to admission  Medication Sig Dispense  Refill  . aspirin 81 MG tablet Take 81 mg by mouth daily.      . clopidogrel (PLAVIX) 75 MG tablet Take 1 tablet (75 mg total) by mouth daily.  90 tablet  3  . fexofenadine (ALLEGRA) 180 MG tablet Take 180 mg by mouth daily.        . metFORMIN (GLUCOPHAGE) 500 MG tablet Take 500 mg by mouth 2 (two) times daily with a meal.      . metoprolol tartrate (LOPRESSOR) 25 MG tablet Take 1 tablet (25 mg total) by mouth 2 (two) times daily.  60 tablet  11  . Multiple Vitamin (MULTIVITAMIN) tablet Take 1 tablet by mouth daily.        . nitroGLYCERIN (NITROSTAT) 0.4 MG SL tablet Place 0.4 mg under the tongue every 5 (five) minutes as needed for chest pain.      . simvastatin (ZOCOR) 40 MG tablet Take 1 tablet (40 mg total) by mouth at bedtime.  30 tablet  11   Allergies  Allergen Reactions  . Brilinta (Ticagrelor)     Arthralgias & myalgias    History  Substance Use Topics  . Smoking status: Former Smoker    Quit date: 11/02/1984  . Smokeless tobacco: Never Used  . Alcohol Use: No    Family History  Problem Relation Age of Onset  . Heart attack Brother 50  . Diabetes Neg Hx   .  Stroke Neg Hx      Objective: Patient Vitals for the past 8 hrs:  BP Temp Temp src Pulse Resp SpO2 Height Weight  02/18/13 0400 124/61 mmHg 98.2 F (36.8 C) Oral 67 12 94 % - -  02/18/13 0300 110/51 mmHg - - 60 15 94 % - -  02/18/13 0200 118/55 mmHg - - 62 16 96 % - -  02/18/13 0100 118/56 mmHg - - 64 15 96 % - -  02/18/13 0045 106/87 mmHg 98 F (36.7 C) Oral 67 17 96 % - -  02/18/13 0000 - - - 76 17 95 % - -  02/17/13 2300 123/61 mmHg - - 77 15 95 % - -  02/17/13 2100 - - - - - - 6' 0.05" (1.83 m) 84.8 kg (186 lb 15.2 oz)   General Appearance:    Alert, cooperative, no distress,  Head:    Normocephalic, without obvious abnormality, atraumatic  Eyes:     Anicteric sclerae  Neck:   Supple  Lungs:     Clear to auscultation bilaterally, respirations unlabored  Heart:    Regular rate and rhythm, S1 and S2  normal, no murmur  Abdomen:     Soft, non-tender, normoactive bowel sounds  Extremities:   Extremities normal, atraumatic, no cyanosis or edema  Pulses:   2+ and symmetric all extremities  Skin:   no rashes or lesions  Neurologic:   No focal deficits. AAO x3   Results for orders placed during the hospital encounter of 02/17/13 (from the past 48 hour(s))  CBC WITH DIFFERENTIAL     Status: Abnormal   Collection Time    02/17/13  8:44 PM      Result Value Range   WBC 10.5  4.0 - 10.5 K/uL   RBC 4.26  4.22 - 5.81 MIL/uL   Hemoglobin 11.9 (*) 13.0 - 17.0 g/dL   HCT 16.1 (*) 09.6 - 04.5 %   MCV 82.4  78.0 - 100.0 fL   MCH 27.9  26.0 - 34.0 pg   MCHC 33.9  30.0 - 36.0 g/dL   RDW 40.9  81.1 - 91.4 %   Platelets 180  150 - 400 K/uL   Neutrophils Relative 72  43 - 77 %   Neutro Abs 7.6  1.7 - 7.7 K/uL   Lymphocytes Relative 16  12 - 46 %   Lymphs Abs 1.6  0.7 - 4.0 K/uL   Monocytes Relative 9  3 - 12 %   Monocytes Absolute 1.0  0.1 - 1.0 K/uL   Eosinophils Relative 3  0 - 5 %   Eosinophils Absolute 0.3  0.0 - 0.7 K/uL   Basophils Relative 0  0 - 1 %   Basophils Absolute 0.0  0.0 - 0.1 K/uL  BASIC METABOLIC PANEL     Status: Abnormal   Collection Time    02/17/13  8:44 PM      Result Value Range   Sodium 140  135 - 145 mEq/L   Potassium 3.6  3.5 - 5.1 mEq/L   Chloride 102  96 - 112 mEq/L   CO2 29  19 - 32 mEq/L   Glucose, Bld 135 (*) 70 - 99 mg/dL   BUN 23  6 - 23 mg/dL   Creatinine, Ser 7.82  0.50 - 1.35 mg/dL   Calcium 9.8  8.4 - 95.6 mg/dL   GFR calc non Af Amer 55 (*) >90 mL/min   GFR calc Af Amer 64 (*) >  90 mL/min   Comment:            The eGFR has been calculated     using the CKD EPI equation.     This calculation has not been     validated in all clinical     situations.     eGFR's persistently     <90 mL/min signify     possible Chronic Kidney Disease.  POCT I-STAT TROPONIN I     Status: Abnormal   Collection Time    02/17/13  9:08 PM      Result Value Range    Troponin i, poc 0.11 (*) 0.00 - 0.08 ng/mL   Comment NOTIFIED PHYSICIAN     Comment 3            Comment: Due to the release kinetics of cTnI,     a negative result within the first hours     of the onset of symptoms does not rule out     myocardial infarction with certainty.     If myocardial infarction is still suspected,     repeat the test at appropriate intervals.  TROPONIN I     Status: Abnormal   Collection Time    02/17/13  9:33 PM      Result Value Range   Troponin I 0.33 (*) <0.30 ng/mL   Comment:            Due to the release kinetics of cTnI,     a negative result within the first hours     of the onset of symptoms does not rule out     myocardial infarction with certainty.     If myocardial infarction is still suspected,     repeat the test at appropriate intervals.     CRITICAL RESULT CALLED TO, READ BACK BY AND VERIFIED WITH:     Larose Kells RN [id number] [facility] [place], S  TROPONIN I     Status: Abnormal   Collection Time    02/18/13  2:00 AM      Result Value Range   Troponin I 1.48 (*) <0.30 ng/mL   Comment:            Due to the release kinetics of cTnI,     a negative result within the first hours     of the onset of symptoms does not rule out     myocardial infarction with certainty.     If myocardial infarction is still suspected,     repeat the test at appropriate intervals.     CRITICAL VALUE NOTED.  VALUE IS CONSISTENT WITH PREVIOUSLY REPORTED AND CALLED VALUE.   Dg Chest 2 View  02/17/2013  *RADIOLOGY REPORT*  Clinical Data: Chest pain  CHEST - 2 VIEW  Comparison: 10/02/2012  Findings: Lungs are essentially clear.  No focal consolidation.  No pleural effusion or pneumothorax.  The heart is normal in size.  Degenerative changes of the visualized thoracolumbar spine.  IMPRESSION: No evidence of acute cardiopulmonary disease.   Original Report Authenticated By: Charline Bills, M.D.     ECG:  Sinus tach HR 102, likely old inferior infarct, ST depression  V4-V6  Assessment: ACS/NSTEMI   Plan:  1. Cardiology Admission to stepdown unit 2. Continuous monitoring on Telemetry. 3. Repeat ekg on admit, prn chest pain or arrythmia 4. Trend cardiac biomarkers 5. Medical management to include ASA, Heparin, Plavix, BB, Statin, NTG prn 6. TTE in am assess LV function, wall motion  abnormality 7. Likely need further ischemic evaluation with cardiac catheterization.

## 2013-02-17 NOTE — ED Notes (Signed)
According to EMS, the patient had a heart attack and had stent placement last year.  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  The patient ate too fast and laid down and started experiencing "indigestion" and chest discomfort so he called EMS.  He took a baby aspirin and prior to EMS arrival his chest pain resolved.  The patient requested EMS come and transport him to the ED to be evaluated.

## 2013-02-17 NOTE — ED Provider Notes (Signed)
History     CSN: [id number]  Arrival date & time 02/17/13  1959   First MD Initiated Contact with Patient 02/17/13 2029      Chief Complaint  Patient presents with  . Chest Pain    (Consider location/radiation/quality/duration/timing/severity/associated sxs/prior treatment) HPI Comments: 3 y M with PMH of HTN, HLD, DM and CAD/STEMI (bare metal stent prox RCA in 2007) here for eval 2/2 "indigestion" tonight.  He reports that he worked outside all day today pressure washing his house and didn't have any pain or discomfort. He then ate french fries and hot dogs for dinner and immediately after eating developed substernal pressure that he described as "indigestion".  He took 2 alka-seltzers, burped twice and reports his symptoms resolved.  He denies CP currently.  His wife reports that he looked pale during the episode.  Pt denies associated nausea, lightheadedness, SOB and vomiting.   Patient is a 72 y.o. male presenting with chest pain. The history is provided by the patient.  Chest Pain Pain location:  Substernal area Pain quality: pressure   Pain radiates to:  Does not radiate Onset quality:  Sudden Duration:  30 minutes Timing:  Constant Progression:  Resolved Chronicity:  New Associated symptoms: diaphoresis   Associated symptoms: no cough, no dizziness, no fever and no shortness of breath     Past Medical History  Diagnosis Date  . CAD (coronary artery disease), native coronary artery     s/p cath May 2011 with placement of DES distal RCA.  [name] Kitchen Hypertension   . Hyperlipidemia   . Diabetes mellitus     AODM  . Anemia, unspecified   . Colorectal polyps     recurrent  . Erectile dysfunction     Past Surgical History  Procedure Laterality Date  . Wisdom tooth extraction    . Colonoscopy w/ polypectomy  2002  . Infantile paralysis facial asymmetry    . Coronary stent placement  2007, 2011  . Colonoscopy w/ polypectomy  2004; 2007 negative    Family History  Problem  Relation Age of Onset  . Heart attack Brother 50  . Diabetes Neg Hx   . Stroke Neg Hx     History  Substance Use Topics  . Smoking status: Former Smoker    Quit date: 11/02/1984  . Smokeless tobacco: Never Used  . Alcohol Use: No      Review of Systems  Constitutional: Positive for diaphoresis. Negative for fever and chills.  Respiratory: Negative for cough and shortness of breath.   Cardiovascular: Positive for chest pain.  Neurological: Negative for dizziness and light-headedness.  All other systems reviewed and are negative.    Allergies  Brilinta  Home Medications   Current Outpatient Rx  Name  Route  Sig  Dispense  Refill  . aspirin 81 MG tablet   Oral   Take 81 mg by mouth daily.         . clopidogrel (PLAVIX) 75 MG tablet   Oral   Take 1 tablet (75 mg total) by mouth daily.   90 tablet   3   . fexofenadine (ALLEGRA) 180 MG tablet   Oral   Take 180 mg by mouth daily.           . metFORMIN (GLUCOPHAGE) 500 MG tablet   Oral   Take 500 mg by mouth 2 (two) times daily with a meal.         . metoprolol tartrate (LOPRESSOR) 25 MG tablet  Oral   Take 1 tablet (25 mg total) by mouth 2 (two) times daily.   60 tablet   11   . Multiple Vitamin (MULTIVITAMIN) tablet   Oral   Take 1 tablet by mouth daily.           . nitroGLYCERIN (NITROSTAT) 0.4 MG SL tablet   Sublingual   Place 0.4 mg under the tongue every 5 (five) minutes as needed for chest pain.         . simvastatin (ZOCOR) 40 MG tablet   Oral   Take 1 tablet (40 mg total) by mouth at bedtime.   30 tablet   11     BP 132/58  Temp(Src) 97.5 F (36.4 C) (Oral)  Resp 16  SpO2 95%  Physical Exam  Vitals reviewed. Constitutional: He is oriented to person, place, and time. He appears well-developed and well-nourished. No distress.  HENT:  Head: Normocephalic.  Right Ear: External ear normal.  Left Ear: External ear normal.  Nose: Nose normal.  Mouth/Throat: Oropharynx is clear  and moist. No oropharyngeal exudate.  Eyes: Conjunctivae and EOM are normal. Pupils are equal, round, and reactive to light.  Neck: Normal range of motion. Neck supple.  Cardiovascular: Normal rate, regular rhythm, normal heart sounds and intact distal pulses.  Exam reveals no gallop and no friction rub.   No murmur heard. Pulmonary/Chest: Effort normal and breath sounds normal.  Abdominal: Soft. Bowel sounds are normal. He exhibits no distension. There is no tenderness.  Musculoskeletal: Normal range of motion. He exhibits no edema and no tenderness.  Neurological: He is alert and oriented to person, place, and time. No cranial nerve deficit.  Skin: Skin is warm and dry.  Psychiatric: He has a normal mood and affect.    ED Course  Procedures (including critical care time)  Labs Reviewed  CBC WITH DIFFERENTIAL - Abnormal; Notable for the following:    Hemoglobin 11.9 (*)    HCT 35.1 (*)    All other components within normal limits  BASIC METABOLIC PANEL - Abnormal; Notable for the following:    Glucose, Bld 135 (*)    GFR calc non Af Amer 55 (*)    GFR calc Af Amer 64 (*)    All other components within normal limits  TROPONIN I - Abnormal; Notable for the following:    Troponin I 0.33 (*)    All other components within normal limits  POCT I-STAT TROPONIN I - Abnormal; Notable for the following:    Troponin i, poc 0.11 (*)    All other components within normal limits  HEPARIN LEVEL (UNFRACTIONATED)  CBC   Dg Chest 2 View  02/17/2013  *RADIOLOGY REPORT*  Clinical Data: Chest pain  CHEST - 2 VIEW  Comparison: 10/02/2012  Findings: Lungs are essentially clear.  No focal consolidation.  No pleural effusion or pneumothorax.  The heart is normal in size.  Degenerative changes of the visualized thoracolumbar spine.  IMPRESSION: No evidence of acute cardiopulmonary disease.   Original Report Authenticated By: Charline Bills, M.D.     Date: 02/18/2013  Rate: 102  Rhythm: sinus  tachycardia  QRS Axis: normal  Intervals: normal  ST/T Wave abnormalities: NSTWA in inferior and lateral leads  Conduction Disutrbances:none  Narrative Interpretation:   Old EKG Reviewed: possible pseudonormalization of T waves inferiorly and laterally compared to old EKG, possibly progression of inferior MI    1. Chest pain   2. Troponin level elevated   3. ACS (acute  coronary syndrome)       MDM   65 y M with PMH of HTN, HLD, DM and CAD/STEMI (bare metal stent prox RCA in 2007) here for eval 2/2 "indigestion" tonight.  He reports that he worked outside all day today pressure washing his house and didn't have any pain or discomfort. He then ate french fries and hot dogs for dinner and immediately after eating developed substernal pressure that he described as "indigestion".  He took 2 alka-seltzers, burped twice and reports his symptoms resolved.  He denies CP currently.  His wife reports that he looked pale during the episode.  Pt denies associated nausea, lightheadedness, SOB and vomiting.  AFVSS, well appearing.  Lungs clear.  Cardiac exam WNLs.  Abd soft, NT.  No edema.  Diff Dx: ACS, GERD, MSK, Pericarditis.  Concern for ACS.  EKG reviewed and with some possible pseudo-normalization of T waves inferiorly and laterally.  Trop, CBC, BMP.  10:00 PM POCT trop elevated at 0.11.  Regular trop sent.  Pt still CP free.  Heparin bolus/gtt.  Nitropaste.  ASA.  10:27 PM Lab Trop at 0.33.  Cards consulted for admission for ACS.   Disposition: Admit  Condition: Stable  Pt seen in conjunction with my attending, Dr. Preston Fleeting.  Oleh Genin, MD PGY-II [facility] Emergency Medicine Resident  Oleh Genin, MD 02/18/13 [phone]

## 2013-02-17 NOTE — ED Provider Notes (Addendum)
72 year old male had an episode of indigestion that started about 6 PM and ended about 7 PM. There is no associated dyspnea, nausea, diaphoresis but his wife states that he looked very pale. He took a baby aspirin and called EMS. Pain has been completely resolved since 7 PM. He did have a heart attack 4 months ago and had a stent placed. He states current episode did not feel anything like what he had with his heart attack. On exam, lungs are clear heart has regular rate and rhythm. Abdomen is soft and nontender. There's no peripheral edema. Troponin I has come back slightly elevated at 0.11. Laboratory troponin has been ordered and he will be placed on heparin drip with nitroglycerin and will need to have cardiac markers cycled. I reviewed his past records and at that time he had his angioplasty and stent placement there were no other lesions which were worse than 60%.   Date: 02/17/2013  Rate: 102  Rhythm: sinus tachycardia  QRS Axis: normal  Intervals: normal  ST/T Wave abnormalities: nonspecific ST/T changes  Conduction Disutrbances:none  Narrative Interpretation: Age indeterminant inferior wall myocardial infarction. When compared with ECG of 10/04/2012, no evolutionary changes of myocardial infarction are noted.  Old EKG Reviewed: changes noted  CRITICAL CARE Performed by: XBJYN,WGNFA   Total critical care time: 35 minutes  Critical care time was exclusive of separately billable procedures and treating other patients.  Critical care was necessary to treat or prevent imminent or life-threatening deterioration.  Critical care was time spent personally by me on the following activities: development of treatment plan with patient and/or surrogate as well as nursing, discussions with consultants, evaluation of patient's response to treatment, examination of patient, obtaining history from patient or surrogate, ordering and performing treatments and interventions, ordering and review of  laboratory studies, ordering and review of radiographic studies, pulse oximetry and re-evaluation of patient's condition.   I saw and evaluated the patient, reviewed the resident's note and I agree with the findings and plan.   Dione Booze, MD 02/17/13 2130  Dione Booze, MD 02/18/13 [phone]

## 2013-02-17 NOTE — Progress Notes (Signed)
ANTICOAGULATION CONSULT NOTE - Initial Consult  Pharmacy Consult for Heparin Indication: chest pain/ACS  Allergies  Allergen Reactions  . Brilinta (Ticagrelor)     Arthralgias & myalgias    Patient Measurements:   Heparin Dosing Weight: 84.8 kg  Vital Signs: Temp: 97.5 F (36.4 C) (04/18 2013) Temp src: Oral (04/18 2013) BP: 132/58 mmHg (04/18 2013)  Labs:  Recent Labs  02/17/13 2044  HGB 11.9*  HCT 35.1*  PLT 180  CREATININE 1.26    The CrCl is unknown because both a height and weight (above a minimum accepted value) are required for this calculation.   Medical History: Past Medical History  Diagnosis Date  . CAD (coronary artery disease), native coronary artery     s/p cath May 2011 with placement of DES distal RCA.  [name] Kitchen Hypertension   . Hyperlipidemia   . Diabetes mellitus     AODM  . Anemia, unspecified   . Colorectal polyps     recurrent  . Erectile dysfunction     Medications: ASA 81mg , Plavix, Allegra, metformin, metoprolol, MV, NTG SL, Zocor  Assessment: 72 y/o M with known CAD and MI last year. Patient ate rapidly, laid down, and started experiencing indigestion and CP.  Patient also as h/o HTN, HLD, DM, and anemia. Troponin 0.11 borderline. Hgb slightly low at 11.9.    Goal of Therapy:  Heparin level 0.3-0.7 units/ml Monitor platelets by anticoagulation protocol: Yes   Plan:  Start IV heparin with 4000 units bolus and infusion 1100 units/hr Check heparin level in 6-8 hrs and daily.  Misty Stanley Stillinger 02/17/2013,9:52 PM

## 2013-02-17 NOTE — ED Notes (Signed)
CRITICAL VALUE ALERT  Critical value received:  Troponin I 0.33  Date of notification:  02/17/2013  Time of notification:  2222  Critical value read back:yes  Nurse who received alert:  MG Jenelle Mages, RN  MD notified (1st page):  Dr. Preston Fleeting  Time of first page:  2223  MD notified (2nd page):  Time of second page:  Responding MD: Dr. Preston Fleeting  Time MD responded:  2223

## 2013-02-17 NOTE — ED Notes (Signed)
According to the patient he was pressure washing his home, shutters, sidewalk, and porch.  Patient said he ate dinner and thinks he ate too fast.  The patient said he laid down and started feeling uncomfortable like he had indigestion.  The patient said he called EMS because he did not want to go through having a heart attack again.

## 2013-02-18 ENCOUNTER — Encounter (HOSPITAL_COMMUNITY): Payer: Self-pay | Admitting: *Deleted

## 2013-02-18 DIAGNOSIS — I214 Non-ST elevation (NSTEMI) myocardial infarction: Secondary | ICD-10-CM

## 2013-02-18 LAB — CBC
HCT: 33.8 % — ABNORMAL LOW (ref 39.0–52.0)
Platelets: 174 10*3/uL (ref 150–400)
RDW: 14.2 % (ref 11.5–15.5)
WBC: 7.2 10*3/uL (ref 4.0–10.5)

## 2013-02-18 LAB — BASIC METABOLIC PANEL
CO2: 27 mEq/L (ref 19–32)
Calcium: 9.5 mg/dL (ref 8.4–10.5)
Creatinine, Ser: 1.11 mg/dL (ref 0.50–1.35)
GFR calc non Af Amer: 64 mL/min — ABNORMAL LOW (ref >90)
Glucose, Bld: 103 mg/dL — ABNORMAL HIGH (ref 70–99)

## 2013-02-18 LAB — HEPARIN LEVEL (UNFRACTIONATED): Heparin Unfractionated: 0.32 IU/mL (ref 0.30–0.70)

## 2013-02-18 LAB — GLUCOSE, CAPILLARY
Glucose-Capillary: 106 mg/dL — ABNORMAL HIGH (ref 70–99)
Glucose-Capillary: 106 mg/dL — ABNORMAL HIGH (ref 70–99)
Glucose-Capillary: 107 mg/dL — ABNORMAL HIGH (ref 70–99)

## 2013-02-18 LAB — TROPONIN I
Troponin I: 1.48 ng/mL (ref <0.30)
Troponin I: 1.41 ng/mL (ref <0.30)

## 2013-02-18 LAB — MAGNESIUM: Magnesium: 2 mg/dL (ref 1.5–2.5)

## 2013-02-18 MED ORDER — METOPROLOL TARTRATE 25 MG PO TABS
25.0000 mg | ORAL_TABLET | Freq: Two times a day (BID) | ORAL | Status: DC
  Administered 2013-02-18 – 2013-02-21 (×7): 25 mg via ORAL
  Filled 2013-02-18 (×9): qty 1

## 2013-02-18 MED ORDER — ALUM & MAG HYDROXIDE-SIMETH 200-200-20 MG/5ML PO SUSP
30.0000 mL | ORAL | Status: DC | PRN
  Administered 2013-02-18: 30 mL via ORAL
  Filled 2013-02-18: qty 30

## 2013-02-18 MED ORDER — NITROGLYCERIN 0.4 MG SL SUBL
0.4000 mg | SUBLINGUAL_TABLET | SUBLINGUAL | Status: DC | PRN
  Filled 2013-02-18: qty 25

## 2013-02-18 MED ORDER — ASPIRIN EC 81 MG PO TBEC
81.0000 mg | DELAYED_RELEASE_TABLET | Freq: Every day | ORAL | Status: DC
  Administered 2013-02-18 – 2013-02-21 (×3): 81 mg via ORAL
  Filled 2013-02-18 (×4): qty 1

## 2013-02-18 MED ORDER — SIMVASTATIN 40 MG PO TABS
40.0000 mg | ORAL_TABLET | Freq: Every day | ORAL | Status: DC
  Administered 2013-02-18 – 2013-02-20 (×3): 40 mg via ORAL
  Filled 2013-02-18 (×6): qty 1

## 2013-02-18 MED ORDER — INSULIN ASPART 100 UNIT/ML ~~LOC~~ SOLN: 0.0000 [IU] | Freq: Every day | SUBCUTANEOUS | Status: DC

## 2013-02-18 MED ORDER — NITROGLYCERIN 2 % TD OINT
0.5000 [in_us] | TOPICAL_OINTMENT | Freq: Three times a day (TID) | TRANSDERMAL | Status: DC
  Administered 2013-02-18 – 2013-02-20 (×6): 0.5 [in_us] via TOPICAL
  Filled 2013-02-18: qty 30

## 2013-02-18 MED ORDER — ONDANSETRON HCL 4 MG/2ML IJ SOLN: 4.0000 mg | Freq: Four times a day (QID) | INTRAMUSCULAR | Status: DC | PRN

## 2013-02-18 MED ORDER — INSULIN ASPART 100 UNIT/ML ~~LOC~~ SOLN
0.0000 [IU] | Freq: Three times a day (TID) | SUBCUTANEOUS | Status: DC
  Administered 2013-02-19: 2 [IU] via SUBCUTANEOUS
  Administered 2013-02-19 – 2013-02-20 (×2): 1 [IU] via SUBCUTANEOUS

## 2013-02-18 MED ORDER — ACETAMINOPHEN 325 MG PO TABS: 650.0000 mg | ORAL_TABLET | ORAL | Status: DC | PRN

## 2013-02-18 MED ORDER — SODIUM CHLORIDE 0.9 % IV SOLN: INTRAVENOUS | Status: DC | PRN

## 2013-02-18 MED ORDER — ADULT MULTIVITAMIN W/MINERALS CH
1.0000 | ORAL_TABLET | Freq: Every day | ORAL | Status: DC
  Administered 2013-02-19 – 2013-02-21 (×3): 1 via ORAL
  Filled 2013-02-18 (×5): qty 1

## 2013-02-18 MED ORDER — CLOPIDOGREL BISULFATE 75 MG PO TABS
75.0000 mg | ORAL_TABLET | Freq: Every day | ORAL | Status: DC
  Administered 2013-02-18 – 2013-02-21 (×4): 75 mg via ORAL
  Filled 2013-02-18 (×5): qty 1

## 2013-02-18 NOTE — Progress Notes (Signed)
ANTICOAGULATION CONSULT NOTE  Pharmacy Consult for Heparin Indication: chest pain/ACS  Allergies  Allergen Reactions  . Brilinta (Ticagrelor)     Arthralgias & myalgias    Patient Measurements: Height: 6' 0.05" (183 cm) Weight: 186 lb 15.2 oz (84.8 kg) IBW/kg (Calculated) : 77.71 Heparin Dosing Weight: 84.8 kg  Vital Signs: Temp: 98.4 F (36.9 C) (04/19 1145) Temp src: Oral (04/19 1145) BP: 117/59 mmHg (04/19 1400) Pulse Rate: 71 (04/19 1400)  Labs:  Recent Labs  02/17/13 2044  02/18/13 0200 02/18/13 0620 02/18/13 0835 02/18/13 1333 02/18/13 1340  HGB 11.9*  --   --  11.4*  --   --   --   HCT 35.1*  --   --  33.8*  --   --   --   PLT 180  --   --  174  --   --   --   LABPROT  --   --   --  13.9  --   --   --   INR  --   --   --  1.08  --   --   --   HEPARINUNFRC  --   --   --  0.32  --  0.39  --   CREATININE 1.26  --   --  1.11  --   --   --   TROPONINI  --   < > 1.48*  --  1.41*  --  0.96*  < > = values in this interval not displayed.  Estimated Creatinine Clearance: 66.1 ml/min (by C-G formula based on Cr of 1.11).  Assessment: 72 y/o Male with with known CAD - STEMI/DES to RCA 5/11 presents with NSTEMI.  Started on heparin drip 1100 uts/hr HL 0.32 - low end goal so drip increased to 1250 uts/hr.  HL now 0.39, cbc stable, no bleeding noted.  Currently on ASA, clopidogrel, metoprolol, simvastatin, VSS  Goal of Therapy:  Heparin level 0.3-0.7 units/ml Monitor platelets by anticoagulation protocol: Yes   Plan:  Continue Heparin 1250 units/hr  Daily HL, CBC  Leota Sauers Pharm.D. CPP, BCPS Clinical Pharmacist [phone] 02/18/2013 2:42 PM

## 2013-02-18 NOTE — Progress Notes (Addendum)
ANTICOAGULATION CONSULT NOTE  Pharmacy Consult for Heparin Indication: chest pain/ACS  Allergies  Allergen Reactions  . Brilinta (Ticagrelor)     Arthralgias & myalgias    Patient Measurements: Height: 6' 0.05" (183 cm) Weight: 186 lb 15.2 oz (84.8 kg) IBW/kg (Calculated) : 77.71 Heparin Dosing Weight: 84.8 kg  Vital Signs: Temp: 98.2 F (36.8 C) (04/19 0400) Temp src: Oral (04/19 0400) BP: 117/58 mmHg (04/19 0600) Pulse Rate: 60 (04/19 0600)  Labs:  Recent Labs  02/17/13 2044 02/17/13 2133 02/18/13 0200 02/18/13 0620  HGB 11.9*  --   --   --   HCT 35.1*  --   --   --   PLT 180  --   --   --   LABPROT  --   --   --  13.9  INR  --   --   --  1.08  HEPARINUNFRC  --   --   --  0.32  CREATININE 1.26  --   --   --   TROPONINI  --  0.33* 1.48*  --     Estimated Creatinine Clearance: 58.2 ml/min (by C-G formula based on Cr of 1.26).  Assessment: 72 y/o Male with NSTEMI for heparin   Goal of Therapy:  Heparin level 0.3-0.7 units/ml Monitor platelets by anticoagulation protocol: Yes   Plan:  Increase Heparin 1250 units/hr to keep in range Recheck level in 6 hrs  [name] 02/18/2013,7:08 AM

## 2013-02-18 NOTE — Progress Notes (Addendum)
Patient ID: Gregory Bates, male   DOB: [birth year], 72 y.o.   MRN: [id number]    SUBJECTIVE: No further chest pain, no complaints.   [name] Kitchen aspirin EC  81 mg Oral Daily  . clopidogrel  75 mg Oral Q breakfast  . heparin  4,000 Units Intravenous Once  . insulin aspart  0-5 Units Subcutaneous QHS  . insulin aspart  0-9 Units Subcutaneous TID WC  . metoprolol tartrate  25 mg Oral BID  . multivitamin with minerals  1 tablet Oral Daily  . simvastatin  40 mg Oral QHS  heparin gtt NTG paste    Filed Vitals:   02/18/13 0900 02/18/13 1000 02/18/13 1029 02/18/13 1100  BP: 114/56 124/58 124/58 124/61  Pulse: 66 65 74 65  Temp:      TempSrc:      Resp: 17 17  15   Height:      Weight:      SpO2: 96% 95%  97%    Intake/Output Summary (Last 24 hours) at 02/18/13 1146 Last data filed at 02/18/13 1100  Gross per 24 hour  Intake 126.68 ml  Output    600 ml  Net -473.32 ml    LABS: Basic Metabolic Panel:  Recent Labs  95/62/13 2044 02/18/13 0620  NA 140 140  K 3.6 3.8  CL 102 103  CO2 29 27  GLUCOSE 135* 103*  BUN 23 20  CREATININE 1.26 1.11  CALCIUM 9.8 9.5  MG  --  2.0   Liver Function Tests: No results found for this basename: AST, ALT, ALKPHOS, BILITOT, PROT, ALBUMIN,  in the last 72 hours No results found for this basename: LIPASE, AMYLASE,  in the last 72 hours CBC:  Recent Labs  02/17/13 2044 02/18/13 0620  WBC 10.5 7.2  NEUTROABS 7.6  --   HGB 11.9* 11.4*  HCT 35.1* 33.8*  MCV 82.4 81.8  PLT 180 174   Cardiac Enzymes:  Recent Labs  02/17/13 2133 02/18/13 0200 02/18/13 0835  TROPONINI 0.33* 1.48* 1.41*   BNP: No components found with this basename: POCBNP,  D-Dimer: No results found for this basename: DDIMER,  in the last 72 hours Hemoglobin A1C: No results found for this basename: HGBA1C,  in the last 72 hours Fasting Lipid Panel: No results found for this basename: CHOL, HDL, LDLCALC, TRIG, CHOLHDL, LDLDIRECT,  in the last 72 hours Thyroid  Function Tests: No results found for this basename: TSH, T4TOTAL, FREET3, T3FREE, THYROIDAB,  in the last 72 hours Anemia Panel: No results found for this basename: VITAMINB12, FOLATE, FERRITIN, TIBC, IRON, RETICCTPCT,  in the last 72 hours  RADIOLOGY: Dg Chest 2 View  02/17/2013  *RADIOLOGY REPORT*  Clinical Data: Chest pain  CHEST - 2 VIEW  Comparison: 10/02/2012  Findings: Lungs are essentially clear.  No focal consolidation.  No pleural effusion or pneumothorax.  The heart is normal in size.  Degenerative changes of the visualized thoracolumbar spine.  IMPRESSION: No evidence of acute cardiopulmonary disease.   Original Report Authenticated By: Charline Bills, M.D.     PHYSICAL EXAM General: NAD Neck: No JVD, no thyromegaly or thyroid nodule.  Lungs: Clear to auscultation bilaterally with normal respiratory effort. CV: Nondisplaced PMI.  Heart regular S1/S2, no S3/S4, no murmur.  No peripheral edema.  No carotid bruit.  Normal pedal pulses.  Abdomen: Soft, nontender, no hepatosplenomegaly, no distention.  Neurologic: Alert and oriented x 3.  Psych: Normal affect. Extremities: No clubbing or cyanosis.   TELEMETRY: Reviewed telemetry  pt in NSR  ASSESSMENT AND PLAN: Patient admitted with NSTEMI.  Had inferior STEMI with DES to RCA in 12/13.  1. CAD: NSTEMI.  He was switched over to Plavix off Brilinta (? SEs) about a month ago.  He is now CP-free.  ECG non-acute. Continue ASA, heparin gtt, Plavix.  Will check P2Y12 and switch over to Prasugrel if platelet inhibition is not adequate. Cath on Monday if remains stable.  Will get echo.  2. Hyperlipidemia: Has not been able to tolerate statins other than simvastatin.   Marca Ancona 02/18/2013 11:46 AM

## 2013-02-19 DIAGNOSIS — I059 Rheumatic mitral valve disease, unspecified: Secondary | ICD-10-CM

## 2013-02-19 LAB — GLUCOSE, CAPILLARY
Glucose-Capillary: 131 mg/dL — ABNORMAL HIGH (ref 70–99)
Glucose-Capillary: 183 mg/dL — ABNORMAL HIGH (ref 70–99)

## 2013-02-19 LAB — CBC
Hemoglobin: 12.1 g/dL — ABNORMAL LOW (ref 13.0–17.0)
MCH: 28.1 pg (ref 26.0–34.0)
MCV: 82.4 fL (ref 78.0–100.0)
Platelets: 180 10*3/uL (ref 150–400)
RBC: 4.31 MIL/uL (ref 4.22–5.81)

## 2013-02-19 LAB — BASIC METABOLIC PANEL
BUN: 16 mg/dL (ref 6–23)
CO2: 25 mEq/L (ref 19–32)
Calcium: 9.3 mg/dL (ref 8.4–10.5)
Creatinine, Ser: 1.13 mg/dL (ref 0.50–1.35)
Glucose, Bld: 130 mg/dL — ABNORMAL HIGH (ref 70–99)

## 2013-02-19 MED ORDER — SODIUM CHLORIDE 0.9 % IV SOLN: 250.0000 mL | INTRAVENOUS | Status: DC | PRN

## 2013-02-19 MED ORDER — SODIUM CHLORIDE 0.9 % IJ SOLN: 3.0000 mL | INTRAMUSCULAR | Status: DC | PRN

## 2013-02-19 MED ORDER — SODIUM CHLORIDE 0.9 % IJ SOLN
3.0000 mL | Freq: Two times a day (BID) | INTRAMUSCULAR | Status: DC
  Administered 2013-02-19: 3 mL via INTRAVENOUS

## 2013-02-19 MED ORDER — SODIUM CHLORIDE 0.9 % IV SOLN
1.0000 mL/kg/h | INTRAVENOUS | Status: DC
  Administered 2013-02-20: 1 mL/kg/h via INTRAVENOUS

## 2013-02-19 MED ORDER — ASPIRIN 81 MG PO CHEW
324.0000 mg | CHEWABLE_TABLET | ORAL | Status: AC
  Administered 2013-02-20: 324 mg via ORAL
  Filled 2013-02-19: qty 4

## 2013-02-19 NOTE — Progress Notes (Signed)
Patient ID: Gregory Bates, male   DOB: 05/17/1941, 72 y.o.   MRN: [id number]     SUBJECTIVE: No further chest pain, no complaints.   . aspirin EC  81 mg Oral Daily  . clopidogrel  75 mg Oral Q breakfast  . heparin  4,000 Units Intravenous Once  . insulin aspart  0-5 Units Subcutaneous QHS  . insulin aspart  0-9 Units Subcutaneous TID WC  . metoprolol tartrate  25 mg Oral BID  . multivitamin with minerals  1 tablet Oral Daily  . nitroGLYCERIN  0.5 inch Topical Q8H  . simvastatin  40 mg Oral QHS  heparin gtt  Filed Vitals:   02/19/13 0338 02/19/13 0400 02/19/13 0500 02/19/13 0806  BP:  133/70    Pulse: 67 68    Temp: 98.7 F (37.1 C)   98.5 F (36.9 C)  TempSrc: Oral   Oral  Resp: 18 18    Height:      Weight:   182 lb 6.4 oz (82.736 kg)   SpO2: 95% 93%      Intake/Output Summary (Last 24 hours) at 02/19/13 0842 Last data filed at 02/19/13 0700  Gross per 24 hour  Intake 1107.5 ml  Output   1750 ml  Net -642.5 ml    LABS: Basic Metabolic Panel:  Recent Labs  02/18/13 0620 02/19/13 0450  NA 140 137  K 3.8 4.1  CL 103 103  CO2 27 25  GLUCOSE 103* 130*  BUN 20 16  CREATININE 1.11 1.13  CALCIUM 9.5 9.3  MG 2.0  --    Liver Function Tests: No results found for this basename: AST, ALT, ALKPHOS, BILITOT, PROT, ALBUMIN,  in the last 72 hours No results found for this basename: LIPASE, AMYLASE,  in the last 72 hours CBC:  Recent Labs  02/17/13 2044 02/18/13 0620 02/19/13 0450  WBC 10.5 7.2 8.9  NEUTROABS 7.6  --   --   HGB 11.9* 11.4* 12.1*  HCT 35.1* 33.8* 35.5*  MCV 82.4 81.8 82.4  PLT 180 174 180   Cardiac Enzymes:  Recent Labs  02/18/13 0200 02/18/13 0835 02/18/13 1340  TROPONINI 1.48* 1.41* 0.96*   BNP: No components found with this basename: POCBNP,  D-Dimer: No results found for this basename: DDIMER,  in the last 72 hours Hemoglobin A1C: No results found for this basename: HGBA1C,  in the last 72 hours Fasting Lipid Panel: No  results found for this basename: CHOL, HDL, LDLCALC, TRIG, CHOLHDL, LDLDIRECT,  in the last 72 hours Thyroid Function Tests: No results found for this basename: TSH, T4TOTAL, FREET3, T3FREE, THYROIDAB,  in the last 72 hours Anemia Panel: No results found for this basename: VITAMINB12, FOLATE, FERRITIN, TIBC, IRON, RETICCTPCT,  in the last 72 hours  RADIOLOGY: Dg Chest 2 View  02/17/2013  *RADIOLOGY REPORT*  Clinical Data: Chest pain  CHEST - 2 VIEW  Comparison: 10/02/2012  Findings: Lungs are essentially clear.  No focal consolidation.  No pleural effusion or pneumothorax.  The heart is normal in size.  Degenerative changes of the visualized thoracolumbar spine.  IMPRESSION: No evidence of acute cardiopulmonary disease.   Original Report Authenticated By: Sriyesh Krishnan, M.D.     PHYSICAL EXAM General: NAD Neck: No JVD, no thyromegaly or thyroid nodule.  Lungs: Clear to auscultation bilaterally with normal respiratory effort. CV: Nondisplaced PMI.  Heart regular S1/S2, no S3/S4, no murmur.  No peripheral edema.  No carotid bruit.  Normal pedal pulses.  Abdomen: Soft, nontender,   no hepatosplenomegaly, no distention.  Neurologic: Alert and oriented x 3.  Psych: Normal affect. Extremities: No clubbing or cyanosis.   TELEMETRY: Reviewed telemetry pt in NSR  ASSESSMENT AND PLAN: Patient admitted with NSTEMI.  Had inferior STEMI with DES to RCA in 12/13.  1. CAD: NSTEMI.  He was switched over to Plavix off Brilinta due to concern for side effects from Brilinta about a month ago.  He is now CP-free.  ECG non-acute. Continue ASA, statin, heparin gtt, Plavix.  Unable to check P2Y12 inhibition on Plavix because instrument is down.  Regardless, will likely need to be switched over to Effient. Cath on Monday.  Will get echo.  2. Hyperlipidemia: Has not been able to tolerate statins other than simvastatin.   Gregory Bates 02/19/2013 8:42 AM  

## 2013-02-19 NOTE — Progress Notes (Signed)
ANTICOAGULATION CONSULT NOTE  Pharmacy Consult for Heparin Indication: chest pain/ACS  Allergies  Allergen Reactions  . Brilinta (Ticagrelor)     Arthralgias & myalgias    Patient Measurements: Height: 5\' 11"  (180.3 cm) Weight: 182 lb 6.4 oz (82.736 kg) IBW/kg (Calculated) : 75.3 Heparin Dosing Weight: 84.8 kg  Vital Signs: Temp: 98.5 F (36.9 C) (04/20 0806) Temp src: Oral (04/20 0806) BP: 133/70 mmHg (04/20 0400) Pulse Rate: 68 (04/20 0400)  Labs:  Recent Labs  02/17/13 2044  02/18/13 0200 02/18/13 0620 02/18/13 0835 02/18/13 1333 02/18/13 1340 02/19/13 0450  HGB 11.9*  --   --  11.4*  --   --   --  12.1*  HCT 35.1*  --   --  33.8*  --   --   --  35.5*  PLT 180  --   --  174  --   --   --  180  LABPROT  --   --   --  13.9  --   --   --   --   INR  --   --   --  1.08  --   --   --   --   HEPARINUNFRC  --   --   --  0.32  --  0.39  --  0.45  CREATININE 1.26  --   --  1.11  --   --   --  1.13  TROPONINI  --   < > 1.48*  --  1.41*  --  0.96*  --   < > = values in this interval not displayed.  Estimated Creatinine Clearance: 62.9 ml/min (by C-G formula based on Cr of 1.13).  Assessment: 72 y/o Male with with known CAD - STEMI/DES to RCA 5/11 presents with NSTEMI.  Started on heparin drip now running at 1250 uts/hr with a therapeutic HL of 0.45 this am. Cbc stable, no bleeding noted.  Currently on ASA, clopidogrel, metoprolol, simvastatin, VSS  Goal of Therapy:  Heparin level 0.3-0.7 units/ml Monitor platelets by anticoagulation protocol: Yes   Plan:  Continue Heparin 1250 units/hr  Daily HL, CBC Monitor for bleeding   Thank you, Franchot Erichsen, Pharm.D. Clinical Pharmacist   Pager: [phone] 02/19/2013 9:16 AM

## 2013-02-19 NOTE — Progress Notes (Signed)
  Echocardiogram 2D Echocardiogram has been performed.  Macil Crady FRANCES 02/19/2013, 3:32 PM

## 2013-02-20 ENCOUNTER — Encounter (HOSPITAL_COMMUNITY)
Admission: EM | Disposition: A | Discharge status: Home / Self Care | Payer: Self-pay | Source: Home / Self Care | Attending: Cardiology

## 2013-02-20 ENCOUNTER — Encounter: Payer: Self-pay | Admitting: Cardiovascular Disease

## 2013-02-20 DIAGNOSIS — I2510 Atherosclerotic heart disease of native coronary artery without angina pectoris: Secondary | ICD-10-CM

## 2013-02-20 DIAGNOSIS — I214 Non-ST elevation (NSTEMI) myocardial infarction: Secondary | ICD-10-CM

## 2013-02-20 HISTORY — PX: PERCUTANEOUS CORONARY STENT INTERVENTION (PCI-S): SHX5485

## 2013-02-20 HISTORY — PX: LEFT HEART CATHETERIZATION WITH CORONARY ANGIOGRAM: SHX5451

## 2013-02-20 LAB — POCT ACTIVATED CLOTTING TIME: Activated Clotting Time: 611 seconds

## 2013-02-20 LAB — BASIC METABOLIC PANEL
Calcium: 9.2 mg/dL (ref 8.4–10.5)
GFR calc Af Amer: 65 mL/min — ABNORMAL LOW (ref >90)
GFR calc non Af Amer: 56 mL/min — ABNORMAL LOW (ref >90)
Potassium: 3.9 mEq/L (ref 3.5–5.1)
Sodium: 139 mEq/L (ref 135–145)

## 2013-02-20 LAB — GLUCOSE, CAPILLARY
Glucose-Capillary: 106 mg/dL — ABNORMAL HIGH (ref 70–99)
Glucose-Capillary: 136 mg/dL — ABNORMAL HIGH (ref 70–99)
Glucose-Capillary: 108 mg/dL — ABNORMAL HIGH (ref 70–99)

## 2013-02-20 LAB — HEPARIN LEVEL (UNFRACTIONATED): Heparin Unfractionated: 0.36 IU/mL (ref 0.30–0.70)

## 2013-02-20 LAB — CBC
MCHC: 34 g/dL (ref 30.0–36.0)
RDW: 14.2 % (ref 11.5–15.5)

## 2013-02-20 SURGERY — LEFT HEART CATHETERIZATION WITH CORONARY ANGIOGRAM
Anesthesia: LOCAL

## 2013-02-20 MED ORDER — HEPARIN (PORCINE) IN NACL 2-0.9 UNIT/ML-% IJ SOLN
INTRAMUSCULAR | Status: AC
  Filled 2013-02-20: qty 1000

## 2013-02-20 MED ORDER — SODIUM CHLORIDE 0.9 % IV SOLN: INTRAVENOUS | Status: AC

## 2013-02-20 MED ORDER — FENTANYL CITRATE 0.05 MG/ML IJ SOLN
INTRAMUSCULAR | Status: AC
  Filled 2013-02-20: qty 2

## 2013-02-20 MED ORDER — TRAMADOL HCL 50 MG PO TABS: 100.0000 mg | ORAL_TABLET | Freq: Four times a day (QID) | ORAL | Status: DC | PRN

## 2013-02-20 MED ORDER — MIDAZOLAM HCL 2 MG/2ML IJ SOLN
INTRAMUSCULAR | Status: AC
  Filled 2013-02-20: qty 2

## 2013-02-20 MED ORDER — LIDOCAINE HCL (PF) 1 % IJ SOLN
INTRAMUSCULAR | Status: AC
  Filled 2013-02-20: qty 30

## 2013-02-20 MED ORDER — OXYCODONE-ACETAMINOPHEN 5-325 MG PO TABS: 1.0000 | ORAL_TABLET | Freq: Once | ORAL | Status: DC

## 2013-02-20 MED ORDER — ATROPINE SULFATE 0.1 MG/ML IJ SOLN
1.0000 mg | Freq: Once | INTRAMUSCULAR | Status: AC
  Administered 2013-02-20: 15:00:00 1 mg via INTRAVENOUS

## 2013-02-20 MED ORDER — TRAMADOL HCL 50 MG PO TABS
50.0000 mg | ORAL_TABLET | Freq: Four times a day (QID) | ORAL | Status: DC | PRN
  Administered 2013-02-20: 50 mg via ORAL
  Filled 2013-02-20: qty 1

## 2013-02-20 MED ORDER — BIVALIRUDIN 250 MG IV SOLR
INTRAVENOUS | Status: AC
  Filled 2013-02-20: qty 250

## 2013-02-20 MED ORDER — NITROGLYCERIN 1 MG/10 ML FOR IR/CATH LAB
INTRA_ARTERIAL | Status: AC
  Filled 2013-02-20: qty 10

## 2013-02-20 NOTE — Progress Notes (Signed)
Site area: right groin  Site Prior to Removal:  Level 0  Pressure Applied For 20 MINUTES    Minutes Beginning at 1432  Manual:   yes  Patient Status During Pull:  AAO x4  Post Pull Groin Site:  Level 0  Post Pull Instructions Given:  yes  Post Pull Pulses Present:  yes  Dressing Applied:  yes  Comments: Had vagal reaction.Initially  HR dropped to 34 -37 beats/ min , noted pt yawning and pale, SBP maintained 110's to 120's , denies pain nor any discomforts, atropine 1/2 amp initially given , then another 1/2 amp given . HR back  Up to 70's to 80's . Vital signs stable post sheath removal.

## 2013-02-20 NOTE — Progress Notes (Signed)
Utilization Review Completed Daquon Greenleaf J. Isom Kochan, RN, BSN, NCM [phone]  

## 2013-02-20 NOTE — Progress Notes (Signed)
ANTICOAGULATION CONSULT NOTE  Pharmacy Consult for Heparin Indication: chest pain/ACS  Allergies  Allergen Reactions  . Brilinta (Ticagrelor)     Arthralgias & myalgias    Patient Measurements: Height: 5\' 11"  (180.3 cm) Weight: 180 lb 4.8 oz (81.784 kg) IBW/kg (Calculated) : 75.3 Heparin Dosing Weight: 84.8 kg  Vital Signs: Temp: 98.4 F (36.9 C) (04/21 0730) Temp src: Oral (04/21 0730) BP: 147/77 mmHg (04/21 0730) Pulse Rate: 68 (04/21 0730)  Labs:  Recent Labs  02/18/13 0200  02/18/13 0620 02/18/13 0835 02/18/13 1333 02/18/13 1340 02/19/13 0450 02/20/13 0505  HGB  --   --  11.4*  --   --   --  12.1* 12.1*  HCT  --   --  33.8*  --   --   --  35.5* 35.6*  PLT  --   --  174  --   --   --  180 174  LABPROT  --   --  13.9  --   --   --   --   --   INR  --   --  1.08  --   --   --   --   --   HEPARINUNFRC  --   < > 0.32  --  0.39  --  0.45 0.36  CREATININE  --   --  1.11  --   --   --  1.13 1.24  TROPONINI 1.48*  --   --  1.41*  --  0.96*  --   --   < > = values in this interval not displayed.  Estimated Creatinine Clearance: 57.4 ml/min (by C-G formula based on Cr of 1.24).  Assessment: 72 y/o Male with with known CAD - STEMI/DES to RCA 5/11 presents with NSTEMI.  Started on heparin drip now running at 1250 uts/hr with a therapeutic HL of 0.3  this am. Cbc stable, no bleeding noted.  Currently on ASA, clopidogrel, metoprolol, simvastatin, VSS.  For cath this am 02/20/2013   Goal of Therapy:  Heparin level 0.3-0.7 units/ml Monitor platelets by anticoagulation protocol: Yes   Plan:  Follow after cath  Thank you, Sheppard Coil PharmD., BCPS Clinical Pharmacist Pager [phone] 02/20/2013 10:11 AM

## 2013-02-20 NOTE — H&P (View-Only) (Signed)
Patient ID: Gregory Bates, male   DOB: 1941/05/04, 72 y.o.   MRN: [id number]     SUBJECTIVE: No further chest pain, no complaints.   [name] Kitchen aspirin EC  81 mg Oral Daily  . clopidogrel  75 mg Oral Q breakfast  . heparin  4,000 Units Intravenous Once  . insulin aspart  0-5 Units Subcutaneous QHS  . insulin aspart  0-9 Units Subcutaneous TID WC  . metoprolol tartrate  25 mg Oral BID  . multivitamin with minerals  1 tablet Oral Daily  . nitroGLYCERIN  0.5 inch Topical Q8H  . simvastatin  40 mg Oral QHS  heparin gtt  Filed Vitals:   02/19/13 0338 02/19/13 0400 02/19/13 0500 02/19/13 0806  BP:  133/70    Pulse: 67 68    Temp: 98.7 F (37.1 C)   98.5 F (36.9 C)  TempSrc: Oral   Oral  Resp: 18 18    Height:      Weight:   182 lb 6.4 oz (82.736 kg)   SpO2: 95% 93%      Intake/Output Summary (Last 24 hours) at 02/19/13 0842 Last data filed at 02/19/13 0700  Gross per 24 hour  Intake 1107.5 ml  Output   1750 ml  Net -642.5 ml    LABS: Basic Metabolic Panel:  Recent Labs  40/98/11 0620 02/19/13 0450  NA 140 137  K 3.8 4.1  CL 103 103  CO2 27 25  GLUCOSE 103* 130*  BUN 20 16  CREATININE 1.11 1.13  CALCIUM 9.5 9.3  MG 2.0  --    Liver Function Tests: No results found for this basename: AST, ALT, ALKPHOS, BILITOT, PROT, ALBUMIN,  in the last 72 hours No results found for this basename: LIPASE, AMYLASE,  in the last 72 hours CBC:  Recent Labs  02/17/13 2044 02/18/13 0620 02/19/13 0450  WBC 10.5 7.2 8.9  NEUTROABS 7.6  --   --   HGB 11.9* 11.4* 12.1*  HCT 35.1* 33.8* 35.5*  MCV 82.4 81.8 82.4  PLT 180 174 180   Cardiac Enzymes:  Recent Labs  02/18/13 0200 02/18/13 0835 02/18/13 1340  TROPONINI 1.48* 1.41* 0.96*   BNP: No components found with this basename: POCBNP,  D-Dimer: No results found for this basename: DDIMER,  in the last 72 hours Hemoglobin A1C: No results found for this basename: HGBA1C,  in the last 72 hours Fasting Lipid Panel: No  results found for this basename: CHOL, HDL, LDLCALC, TRIG, CHOLHDL, LDLDIRECT,  in the last 72 hours Thyroid Function Tests: No results found for this basename: TSH, T4TOTAL, FREET3, T3FREE, THYROIDAB,  in the last 72 hours Anemia Panel: No results found for this basename: VITAMINB12, FOLATE, FERRITIN, TIBC, IRON, RETICCTPCT,  in the last 72 hours  RADIOLOGY: Dg Chest 2 View  02/17/2013  *RADIOLOGY REPORT*  Clinical Data: Chest pain  CHEST - 2 VIEW  Comparison: 10/02/2012  Findings: Lungs are essentially clear.  No focal consolidation.  No pleural effusion or pneumothorax.  The heart is normal in size.  Degenerative changes of the visualized thoracolumbar spine.  IMPRESSION: No evidence of acute cardiopulmonary disease.   Original Report Authenticated By: Gregory Bates, M.D.     PHYSICAL EXAM General: NAD Neck: No JVD, no thyromegaly or thyroid nodule.  Lungs: Clear to auscultation bilaterally with normal respiratory effort. CV: Nondisplaced PMI.  Heart regular S1/S2, no S3/S4, no murmur.  No peripheral edema.  No carotid bruit.  Normal pedal pulses.  Abdomen: Soft, nontender,  no hepatosplenomegaly, no distention.  Neurologic: Alert and oriented x 3.  Psych: Normal affect. Extremities: No clubbing or cyanosis.   TELEMETRY: Reviewed telemetry pt in NSR  ASSESSMENT AND PLAN: Patient admitted with NSTEMI.  Had inferior STEMI with DES to RCA in 12/13.  1. CAD: NSTEMI.  He was switched over to Plavix off Brilinta due to concern for side effects from Brilinta about a month ago.  He is now CP-free.  ECG non-acute. Continue ASA, statin, heparin gtt, Plavix.  Unable to check P2Y12 inhibition on Plavix because instrument is down.  Regardless, will likely need to be switched over to Effient. Cath on Monday.  Will get echo.  2. Hyperlipidemia: Has not been able to tolerate statins other than simvastatin.   Gregory Bates 02/19/2013 8:42 AM

## 2013-02-20 NOTE — CV Procedure (Signed)
Cardiac Catheterization Operative Report  Gregory Bates [id number] 4/21/201411:31 AM Gregory Melnick, MD  Procedure Performed:  1. Left Heart Catheterization 2. Selective Coronary Angiography 3. Left ventricular angiogram  Operator: Gregory Carrow, MD  Indication:   72 yo WM with history of DM, HTN, hyperlipidemia and CAD with admission to [facility] 03/01/10 with NSTEMI and recent admission to [facility] 10/02/12 with inferior STEMI and found to have occluded RCA treated with DES. He did well and was discharged on 10/04/12. Readmitted this weekend with NSTEMI after episode of chest pain. Troponin peak less than 2.0.                                 Procedure Details: The risks, benefits, complications, treatment options, and expected outcomes were discussed with the patient. The patient and/or family concurred with the proposed plan, giving informed consent. The patient was brought to the cath lab after IV hydration was begun and oral premedication was given. The patient was further sedated with Versed and Fentanyl. The right groin was prepped and draped in the usual manner. Using the modified Seldinger access technique, a 5 French sheath was placed in the right femoral artery. Standard diagnostic catheters were used to perform selective coronary angiography. A pigtail catheter was used to perform a left ventricular angiogram.  He was found to have a severe stenosis in the proximal segment of the large first obtuse marginal branch of the Circumflex. I elected to proceed to intervention. I upsized the sheath to a 6 [place] system. He was given a bolus of Angiomax and a drip was started. I then engaged the left main with a XB 3.0 guiding catheter. When the ACT was greater than 200, I advanced a BMW wire down the first OM branch. I then pre-dilated the area of severe stenosis in the proximal OM1 with a 2.5 x 12 mm balloon. I then carefully positioned and deployed a 2.75 x 12 mm Promus Premier  DES in the proximal Circumflex extending into the first OM branch. The stent was post-dilated with a 3.0 x 9 mm Benedict balloon. There was an excellent angiographic result. The stenosis was taken from 99% down to 0%.   There were no immediate complications. The patient was taken to the recovery area in stable condition.   Hemodynamic Findings: Central aortic pressure: 117/57 Left ventricular pressure: 123/6/13  Angiographic Findings:  Left main:  Distal 20% stenosis.   Left Anterior Descending Artery: Moderate caliber vessel that does not reach the apex. The ostium has a 30% stenosis. The mid vessel has diffuse plaque. The first diagonal branch is small to moderate in caliber (2.0 mm) and has tandem 70% stenoses.   Circumflex Artery: Large caliber vessel with large first obtuse marginal branch. The first OM branch arises early and has a 99% stenosis in the proximal segment. The mid segment of the OM branch has a 50% stenosis. Distally the OM branch bifurcates and there is complex 50% stenosis involving both vessels beyond the branch point, non-flow limiting. The AV groove Circumflex has 70% stenosis just after the takeoff of the OM branch but the vessel is small in caliber.   Right Coronary Artery: Large dominant vessel with severe calcification in the proximal and mid vessel. The proximal vessel has a 40% stenosis. The mid vessel has a patent stent. The distal stented segment is patent with minimal restenosis. The posterolateral branch is small in caliber and  has a 50% stenosis. The PDA is moderate in caliber (2.25 mm vessel) and in the distal segment there is a 60% stenosis followed by a 80% stenosis.   Left Ventricular Angiogram: LVEF 45% with inferior wall hypokinesis.   Impression: 1. Triple vessel CAD with culprit stenosis felt to be the proximal OM1.  2. Patent stents RCA 3. Successful PTCA/DES x 1 proximal OM1 4. Mild segmental LV systolic dysfunction.   Recommendations: Will continue  dual anti-platelet therapy with ASA and Plavix. Will check P2Y12 in am. If PRU is over 230, will switch to Effient.        Complications:  None. The patient tolerated the procedure well.

## 2013-02-20 NOTE — Interval H&P Note (Signed)
History and Physical Interval Note:  02/20/2013 8:31 AM  Gregory Bates  has presented today for cardiac cath with the diagnosis of NSTEMI  The various methods of treatment have been discussed with the patient and family. After consideration of risks, benefits and other options for treatment, the patient has consented to  Procedure(s): LEFT HEART CATHETERIZATION WITH CORONARY ANGIOGRAM (N/A) as a surgical intervention .  The patient's history has been reviewed, patient examined, no change in status, stable for surgery.  I have reviewed the patient's chart and labs.  Questions were answered to the patient's satisfaction.     Daquan Crapps

## 2013-02-21 ENCOUNTER — Ambulatory Visit: Payer: BC Managed Care – PPO | Admitting: Cardiovascular Disease

## 2013-02-21 ENCOUNTER — Encounter (HOSPITAL_COMMUNITY): Payer: Self-pay | Admitting: Nurse Practitioner

## 2013-02-21 DIAGNOSIS — I2510 Atherosclerotic heart disease of native coronary artery without angina pectoris: Secondary | ICD-10-CM

## 2013-02-21 LAB — BASIC METABOLIC PANEL
Chloride: 102 mEq/L (ref 96–112)
GFR calc Af Amer: 66 mL/min — ABNORMAL LOW (ref >90)
Potassium: 4.2 mEq/L (ref 3.5–5.1)
Sodium: 138 mEq/L (ref 135–145)

## 2013-02-21 LAB — CBC
Platelets: 179 10*3/uL (ref 150–400)
RDW: 14.2 % (ref 11.5–15.5)
WBC: 8.8 10*3/uL (ref 4.0–10.5)

## 2013-02-21 LAB — PLATELET INHIBITION P2Y12: Platelet Function  P2Y12: 207 [PRU] (ref 194–418)

## 2013-02-21 MED ORDER — METFORMIN HCL 500 MG PO TABS: 500.0000 mg | ORAL_TABLET | Freq: Two times a day (BID) | ORAL | Status: DC

## 2013-02-21 MED ORDER — CLOPIDOGREL BISULFATE 75 MG PO TABS: 75.0000 mg | ORAL_TABLET | Freq: Every day | ORAL | Status: DC

## 2013-02-21 MED ORDER — NITROGLYCERIN 0.4 MG SL SUBL: 0.4000 mg | SUBLINGUAL_TABLET | SUBLINGUAL | Status: DC | PRN

## 2013-02-21 MED FILL — Dextrose Inj 5%: INTRAVENOUS | Qty: 1000 | Status: AC

## 2013-02-21 NOTE — Progress Notes (Signed)
CARDIAC REHAB PHASE I   PRE:  Rate/Rhythm: 79SR  BP:  Supine:   Sitting: 141/53  Standing:    SaO2:   MODE:  Ambulation: 800 ft   POST:  Rate/Rhythm: 86SR  BP:  Supine:   Sitting: 141/56  Standing:    SaO2: 95-99%RA [id number] Pt familiar to me as I saw him in December and did ed. Pt walked 800 ft with steady gait. Tolerated well. No CP or SOB. Education completed. Reviewed diabetic diet and counting carbs. Discussed CRP 2. Pt states work schedule will not allow. Left brochure in case he changes his mind. Will need effient booklet if switched from plavix to effient.   Luetta Nutting, RN BSN  02/21/2013 8:53 AM

## 2013-02-21 NOTE — Progress Notes (Signed)
    SUBJECTIVE: No chest pain or SOB.   BP 127/58  Pulse 60  Temp(Src) 97.8 F (36.6 C) (Oral)  Resp 18  Ht 5\' 11"  (1.803 m)  Wt 180 lb 4.8 oz (81.784 kg)  BMI 25.16 kg/m2  SpO2 99%  Intake/Output Summary (Last 24 hours) at 02/21/13 3086 Last data filed at 02/20/13 1800  Gross per 24 hour  Intake  855.6 ml  Output      0 ml  Net  855.6 ml    PHYSICAL EXAM General: Well developed, well nourished, in no acute distress. Alert and oriented x 3.  Psych:  Good affect, responds appropriately Neck: No JVD. No masses noted.  Lungs: Clear bilaterally with no wheezes or rhonci noted.  Heart: RRR with no murmurs noted. Abdomen: Bowel sounds are present. Soft, non-tender.  Extremities: No lower extremity edema.   LABS: Basic Metabolic Panel:  Recent Labs  57/84/69 0505 02/21/13 0514  NA 139 138  K 3.9 4.2  CL 104 102  CO2 27 28  GLUCOSE 126* 106*  BUN 17 16  CREATININE 1.24 1.23  CALCIUM 9.2 9.6   CBC:  Recent Labs  02/20/13 0505 02/21/13 0514  WBC 7.8 8.8  HGB 12.1* 12.2*  HCT 35.6* 35.9*  MCV 82.0 82.3  PLT 174 179   Cardiac Enzymes:  Recent Labs  02/18/13 0835 02/18/13 1340  TROPONINI 1.41* 0.96*   Current Meds: . aspirin EC  81 mg Oral Daily  . clopidogrel  75 mg Oral Q breakfast  . insulin aspart  0-5 Units Subcutaneous QHS  . insulin aspart  0-9 Units Subcutaneous TID WC  . metoprolol tartrate  25 mg Oral BID  . multivitamin with minerals  1 tablet Oral Daily  . oxyCODONE-acetaminophen  1 tablet Oral Once  . simvastatin  40 mg Oral QHS     ASSESSMENT AND PLAN:  1. NSTEMI: Pt admitted with chest pain, elevated troponin. Cardiac cath yesterday with severe stenosis OM1. Now s/p DES x 1 OM1. RCA stents patent. Will continue ASA and Plavix. He had been on Brilinta following STEMI in December 2013 but he thought this medication was making him feel poorly so he was switched to Plavix in February 2014. P2Y12 pending this am. If PRU is over 230, will  load with Effient 60 mg po x 1 before discharge then start Effient 10 mg po Qdaily. Continue statin, beta blocker.   2. Dispo: Discharge home today. He can f/u with me in 3-4 weeks.    Gregory Bates  4/22/20147:07 AM

## 2013-02-21 NOTE — Discharge Summary (Signed)
See full note this am. cdm 

## 2013-02-21 NOTE — Discharge Summary (Signed)
Patient ID: Gregory Bates,  MRN: [id number], DOB/AGE: 73-Dec-1942 73 y.o.  Admit date: 02/17/2013 Discharge date: 02/21/2013  Primary Care Provider: [name] Primary Cardiologist: C. McAlhany, MD  Discharge Diagnoses Principal Problem:   NSTEMI (non-ST elevated myocardial infarction)  **s/p PCI/DES to the OM1 this admission.  Previously placed RCA stents patent. Active Problems:   CAD (coronary artery disease)   Type 2 diabetes mellitus with vascular disease   HYPERLIPIDEMIA   HYPERTENSION  Allergies Allergies  Allergen Reactions  . Brilinta (Ticagrelor)     Arthralgias & myalgias   Procedures  2D Echocardiogram 4.20.2014 Study Conclusions  - Left ventricle: The cavity size was normal. Wall thickness   was at the upper limits of normal. The estimated ejection   fraction was 55%. There is hypokinesis of the   mid-distalinferolateral myocardium. Doppler parameters are   consistent with abnormal left ventricular relaxation   (grade 1 diastolic dysfunction). - Aortic valve: Trileaflet; mildly calcified leaflets.   Trivial regurgitation. - Mitral valve: Mild regurgitation. - Left atrium: The atrium was mildly dilated. - Right atrium: The atrium was normal in size. Echodensity   at base of atrium likely prominent muscular ridge. Central   venous pressure: 10mm Hg (est). - Tricuspid valve: Trivial regurgitation. - Pulmonary arteries: Systolic pressure could not be   accurately estimated. - Pericardium, extracardiac: There was no pericardial   effusion. _____________  Cardiac Catheterization and Percutaneous Coronary Intervention 4.21.2014  Hemodynamic Findings: Central aortic pressure: 117/57 Left ventricular pressure: 123/6/13  Angiographic Findings:  Left main:  Distal 20% stenosis.    Left Anterior Descending Artery: Moderate caliber vessel that does not reach the apex. The ostium has a 30% stenosis. The mid vessel has diffuse plaque. The first diagonal branch  is small to moderate in caliber (2.0 mm) and has tandem 70% stenoses.   Circumflex Artery: Large caliber vessel with large first obtuse marginal branch. The first OM branch arises early and has a 99% stenosis in the proximal segment. The mid segment of the OM branch has a 50% stenosis. Distally the OM branch bifurcates and there is complex 50% stenosis involving both vessels beyond the branch point, non-flow limiting. The AV groove Circumflex has 70% stenosis just after the takeoff of the OM branch but the vessel is small in caliber.      **The OM1 was successfully stented using a 2.75 x 12 mm Promus Premier DES.**  Right Coronary Artery: Large dominant vessel with severe calcification in the proximal and mid vessel. The proximal vessel has a 40% stenosis. The mid vessel has a patent stent. The distal stented segment is patent with minimal restenosis. The posterolateral branch is small in caliber and has a 50% stenosis. The PDA is moderate in caliber (2.25 mm vessel) and in the distal segment there is a 60% stenosis followed by a 80% stenosis.  Left Ventricular Angiogram: LVEF 45% with inferior wall hypokinesis.  _____________  History of Present Illness  72 y/o male with h/o CAD s/p STEMI in 10/2012 with subsequent stenting of the RCA at that time.  He was in his USOH until the early evening hours of 02/17/2013, when he developed substernal chest pressure after eating.  This was unrelieved with alka seltzer and asa.  He presented to the [facility] ED, where he was found to have troponin elevation.  He was admitted for further evaluation.  Hospital Course  Patient ruled in for NSTEMI, eventually peaking his troponin at 1.48.  He was maintained on asa, plavix,  heparin, beta blocker, and statin therapy.  He had no recurrence of chest pain over the weekend.  2D echo was performed on 4/20 and showed an EF of 45%.  On 4/21, he underwent diagnostic catheterization revealing severe disease in the first obtuse  marginal branch of the left circumflex.  The previously placed RCA stents were patent.  He underwent successful PCI and DES placement to the OM1 without complication.  Post-procedure a P2Y12 was evaluated and felt to be acceptable @ 207, thus plavix therapy has been continued.  He has been ambulating without difficulty or recurrent symptoms and will be discharged home today in good condition.  Discharge Vitals Blood pressure 141/56, pulse 60, temperature 97.7 F (36.5 C), temperature source Oral, resp. rate 18, height 5\' 11"  (1.803 m), weight 180 lb 4.8 oz (81.784 kg), SpO2 98.00%.  Filed Weights   02/19/13 0500 02/20/13 0428 02/20/13 0500  Weight: 182 lb 6.4 oz (82.736 kg) 180 lb 4.8 oz (81.784 kg) 180 lb 4.8 oz (81.784 kg)   Labs  CBC  Recent Labs  02/20/13 0505 02/21/13 0514  WBC 7.8 8.8  HGB 12.1* 12.2*  HCT 35.6* 35.9*  MCV 82.0 82.3  PLT 174 179   Basic Metabolic Panel  Recent Labs  02/20/13 0505 02/21/13 0514  NA 139 138  K 3.9 4.2  CL 104 102  CO2 27 28  GLUCOSE 126* 106*  BUN 17 16  CREATININE 1.24 1.23  CALCIUM 9.2 9.6   Cardiac Enzymes  Recent Labs  02/18/13 1340  TROPONINI 0.96*   Disposition  Pt is being discharged home today in good condition.  Follow-up Plans & Appointments  Follow-up Information   Follow up with Tereso Newcomer, PA-C On 03/02/2013. (9:50 AM)    Contact information:   [address] [place] [address] [phone]       Follow up with [name], MD. (as scheduled.)    Contact information:   [phone] [place] [address] [phone]      Discharge Medications    Medication List    TAKE these medications       aspirin 81 MG tablet  Take 81 mg by mouth daily.     clopidogrel 75 MG tablet  Commonly known as:  PLAVIX  Take 1 tablet (75 mg total) by mouth daily.     fexofenadine 180 MG tablet  Commonly known as:  ALLEGRA  Take 180 mg by mouth daily.     metFORMIN 500 MG  tablet  Commonly known as:  GLUCOPHAGE  Take 1 tablet (500 mg total) by mouth 2 (two) times daily with a meal.     metoprolol tartrate 25 MG tablet  Commonly known as:  LOPRESSOR  Take 1 tablet (25 mg total) by mouth 2 (two) times daily.     multivitamin tablet  Take 1 tablet by mouth daily.     nitroGLYCERIN 0.4 MG SL tablet  Commonly known as:  NITROSTAT  Place 1 tablet (0.4 mg total) under the tongue every 5 (five) minutes as needed for chest pain.     simvastatin 40 MG tablet  Commonly known as:  ZOCOR  Take 1 tablet (40 mg total) by mouth at bedtime.      Outstanding Labs/Studies  None  Duration of Discharge Encounter   Greater than 30 minutes including physician time.  Signed, Nicolasa Ducking NP 02/21/2013, 10:37 AM

## 2013-02-22 ENCOUNTER — Telehealth: Payer: Self-pay | Admitting: *Deleted

## 2013-02-22 ENCOUNTER — Encounter: Payer: Self-pay | Admitting: *Deleted

## 2013-02-22 NOTE — Telephone Encounter (Signed)
TCM call, NSTEMI with DES placement, pt states groin site is fine, no pain, has all meds prescribed and knows his f/u app date and time, pt was encouraged to call with any questions ort concerns.

## 2013-03-02 ENCOUNTER — Ambulatory Visit (INDEPENDENT_AMBULATORY_CARE_PROVIDER_SITE_OTHER): Payer: BC Managed Care – PPO | Admitting: Physician Assistant

## 2013-03-02 ENCOUNTER — Encounter: Payer: Self-pay | Admitting: Physician Assistant

## 2013-03-02 VITALS — BP 120/66 | HR 63 | Ht 71.0 in | Wt 184.4 lb

## 2013-03-02 DIAGNOSIS — E785 Hyperlipidemia, unspecified: Secondary | ICD-10-CM

## 2013-03-02 DIAGNOSIS — I10 Essential (primary) hypertension: Secondary | ICD-10-CM

## 2013-03-02 DIAGNOSIS — I214 Non-ST elevation (NSTEMI) myocardial infarction: Secondary | ICD-10-CM

## 2013-03-02 DIAGNOSIS — M2550 Pain in unspecified joint: Secondary | ICD-10-CM

## 2013-03-02 DIAGNOSIS — I2510 Atherosclerotic heart disease of native coronary artery without angina pectoris: Secondary | ICD-10-CM

## 2013-03-02 NOTE — Progress Notes (Signed)
[address] [place], [place]  [address] Phone: [phone] Fax:  [phone]  Date:  03/02/2013   ID:  Gregory Bates, DOB 1941/07/14, MRN [id number]  PCP:  [name], MD  Primary Cardiologist:  Dr. Verne Carrow     History of Present Illness: Gregory Bates is a 72 y.o. male who returns for followup after a recent admission to the hospital 4/18-4/22 with a non-STEMI. He has a hx of inferior STEMI 12/13 treated with DES to the RCA, DM2, HTN, HL. He presented to the hospital with substernal chest pressure. Peak troponin was 1.48. Echo 02/19/13: EF 55%, inferolateral HK, grade 1 diastolic dysfunction, mild MR, mild LAE. LHC 02/20/13: oLAD 30%, small D1 70%, pOM1 99%, mOM 50%, dOM 50%, AV groove CFX 70% (small vessel), pRCA 40%, dRCA stent patent, PL branch 50%, dPDA 60% followed by 80%, EF 45%. PCI: Promus premier DES to the OM1 PRU noted to be 207. He was continued on Plavix.  Since discharge, he is doing well. He is walking most days of the week without difficulty. Denies chest pain, shortness of breath, syncope, orthopnea, PND or edema.  Labs (4/14):  K 4.2, Cr 1.23, Hgb 12.2   Wt Readings from Last 3 Encounters:  03/02/13 184 lb 6.4 oz (83.643 kg)  02/20/13 180 lb 4.8 oz (81.784 kg)  02/20/13 180 lb 4.8 oz (81.784 kg)     Past Medical History  Diagnosis Date  . CAD (coronary artery disease), native coronary artery     a. s/p cath May 2011 with placement of DES distal RCA;  b. 01/2013 NSTEMI/Cath/PCI: LM 20d, LAD 30ost, D1 70, LCX 57m, OM1 99p (2.75x12 Promus Premier DES), 106m, 50d in branches, RCA Ca2+ prox, 40p, patent stents mid/dist, RPL 50 small, PDA 60/80, EF 45% inf HK.  [name] Kitchen Hypertension   . Hyperlipidemia   . Diabetes mellitus     AODM  . Anemia, unspecified   . Colorectal polyps     recurrent  . Erectile dysfunction   . Diastolic dysfunction     a. 01/2013 Echo: EF 55%, mid-dist inflat HK, Gr1 DD, Triv AI/TR, Mild MR.    Current Outpatient  Prescriptions  Medication Sig Dispense Refill  . aspirin 81 MG tablet Take 81 mg by mouth daily.      . clopidogrel (PLAVIX) 75 MG tablet Take 1 tablet (75 mg total) by mouth daily.  30 tablet  6  . fexofenadine (ALLEGRA) 180 MG tablet Take 180 mg by mouth daily.        . metFORMIN (GLUCOPHAGE) 500 MG tablet Take 1 tablet (500 mg total) by mouth 2 (two) times daily with a meal.      . metoprolol tartrate (LOPRESSOR) 25 MG tablet Take 1 tablet (25 mg total) by mouth 2 (two) times daily.  60 tablet  11  . Multiple Vitamin (MULTIVITAMIN) tablet Take 1 tablet by mouth daily.        . nitroGLYCERIN (NITROSTAT) 0.4 MG SL tablet Place 1 tablet (0.4 mg total) under the tongue every 5 (five) minutes as needed for chest pain.  25 tablet  3  . simvastatin (ZOCOR) 40 MG tablet Take 1 tablet (40 mg total) by mouth at bedtime.  30 tablet  11   No current facility-administered medications for this visit.    Allergies:    Allergies  Allergen Reactions  . Brilinta (Ticagrelor)     Arthralgias & myalgias    Social History:  The patient  reports that he quit smoking about 28 years ago. He has never used smokeless tobacco. He reports that he does not drink alcohol or use illicit drugs.   ROS:  Please see the history of present illness.  He notes bilateral shoulder pain worse in the mornings and improves throughout the day. Denies injury. Denies significant weakness.   All other systems reviewed and negative.   PHYSICAL EXAM: VS:  BP 120/66  Pulse 63  Ht 5\' 11"  (1.803 m)  Wt 184 lb 6.4 oz (83.643 kg)  BMI 25.73 kg/m2 Well nourished, well developed, in no acute distress HEENT: normal Neck: no JVD Cardiac:  normal S1, S2; RRR; no murmur Lungs:  clear to auscultation bilaterally, no wheezing, rhonchi or rales Abd: soft, nontender, no hepatomegaly Ext: no edema; right groin without hematoma or bruit  Skin: warm and dry Neuro:  CNs 2-12 intact, no focal abnormalities noted  EKG:  NSR, HR 63, normal  axis, nonspecific ST-T wave changes     ASSESSMENT AND PLAN:  1. CAD:  Doing well status post non-STEMI treated with DES to the OM.  We discussed the importance of dual antiplatelet therapy.  He is not interested in cardiac rehab.  He will continue to increase activity on his own.  Continue statin. 2. Hypertension:  Controlled.  Continue current therapy.  3. Hyperlipidemia:  Continue statin. 4. Shoulder Pain:  Notes shoulder girdle pain in the AM that improves throughout the day.  No real weakness.  Suggest f/u with PCP.  Will check ESR today. 5. Disposition:  F/u with Dr. Verne Carrow  In 6-8 weeks.   SignedTereso Newcomer, PA-C  10:09 AM 03/02/2013

## 2013-03-02 NOTE — Patient Instructions (Addendum)
Your physician recommends that you continue on your current medications as directed. Please refer to the Current Medication list given to you today.   Your physician recommends that you keep your follow-up appointment with Dr. Clifton James on June 24 @ 3:30   Your physician recommends that you have lab work today sed rate

## 2013-04-10 ENCOUNTER — Other Ambulatory Visit: Payer: Self-pay | Admitting: Internal Medicine

## 2013-04-25 ENCOUNTER — Encounter: Payer: Self-pay | Admitting: Cardiovascular Disease

## 2013-04-25 ENCOUNTER — Ambulatory Visit (INDEPENDENT_AMBULATORY_CARE_PROVIDER_SITE_OTHER): Payer: BC Managed Care – PPO | Admitting: Cardiovascular Disease

## 2013-04-25 VITALS — BP 132/73 | HR 67 | Ht 72.0 in | Wt 187.0 lb

## 2013-04-25 DIAGNOSIS — I2510 Atherosclerotic heart disease of native coronary artery without angina pectoris: Secondary | ICD-10-CM

## 2013-04-25 DIAGNOSIS — E785 Hyperlipidemia, unspecified: Secondary | ICD-10-CM

## 2013-04-25 DIAGNOSIS — I10 Essential (primary) hypertension: Secondary | ICD-10-CM

## 2013-04-25 MED ORDER — SIMVASTATIN 40 MG PO TABS: 40.0000 mg | ORAL_TABLET | Freq: Every day | ORAL | Status: DC

## 2013-04-25 MED ORDER — METOPROLOL TARTRATE 25 MG PO TABS: 25.0000 mg | ORAL_TABLET | Freq: Two times a day (BID) | ORAL | Status: DC

## 2013-04-25 MED ORDER — CLOPIDOGREL BISULFATE 75 MG PO TABS: 75.0000 mg | ORAL_TABLET | Freq: Every day | ORAL | Status: DC

## 2013-04-25 NOTE — Progress Notes (Signed)
History of Present Illness: 72 yo WM with history of DM, HTN, hyperlipidemia and CAD with admission to [facility] April 2014 with with NSTEMI. He is here today for follow up. Admitted to [facility] 10/02/12 with inferior STEMI and found to have occluded RCA treated with DES. He did well and was discharged on 10/04/12. Readmitted 02/17/13 with NSTEMI. DES placed in severe first OM stenosis. He has diffuse moderate disease in all vessels.   He has done well since discharge. No chest pain, SOB, palpitations, near syncope or syncope. He has not tolerated Crestor or Lipitor. He is tolerating his Zocor by taking 20 mg per day. 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 He is walking 1 mile per day.   Primary Care Physician: Alwyn Ren  Last Lipid Profile:Lipid Panel     Component Value Date/Time   CHOL 136 10/03/2012 0305   TRIG 95 10/03/2012 0305   HDL 36* 10/03/2012 0305   CHOLHDL 3.8 10/03/2012 0305   VLDL 19 10/03/2012 0305   LDLCALC 81 10/03/2012 0305     Past Medical History  Diagnosis Date  . CAD (coronary artery disease), native coronary artery     a. s/p cath May 2011 with placement of DES distal RCA;  b. 01/2013 NSTEMI/Cath/PCI: LM 20d, LAD 30ost, D1 70, LCX 54m, OM1 99p (2.75x12 Promus Premier DES), 46m, 50d in branches, RCA Ca2+ prox, 40p, patent stents mid/dist, RPL 50 small, PDA 60/80, EF 45% inf HK.  [name] Kitchen Hypertension   . Hyperlipidemia   . Diabetes mellitus     AODM  . Anemia, unspecified   . Colorectal polyps     recurrent  . Erectile dysfunction   . Diastolic dysfunction     a. 01/2013 Echo: EF 55%, mid-dist inflat HK, Gr1 DD, Triv AI/TR, Mild MR.    Past Surgical History  Procedure Laterality Date  . Wisdom tooth extraction    . Colonoscopy w/ polypectomy  2002  . Infantile paralysis facial asymmetry    . Coronary stent placement  2007, 2011  . Colonoscopy w/ polypectomy  2004; 2007 negative    Current Outpatient Prescriptions  Medication Sig Dispense  Refill  . aspirin 81 MG tablet Take 81 mg by mouth daily.      . clopidogrel (PLAVIX) 75 MG tablet Take 1 tablet (75 mg total) by mouth daily.  30 tablet  6  . fexofenadine (ALLEGRA) 180 MG tablet Take 180 mg by mouth daily.        . metFORMIN (GLUCOPHAGE) 500 MG tablet TAKE 1 TABLET BY MOUTH TWICE DAILY WITH MEALS  180 tablet  0  . metoprolol tartrate (LOPRESSOR) 25 MG tablet Take 1 tablet (25 mg total) by mouth 2 (two) times daily.  60 tablet  11  . Multiple Vitamin (MULTIVITAMIN) tablet Take 1 tablet by mouth daily.        . nitroGLYCERIN (NITROSTAT) 0.4 MG SL tablet Place 1 tablet (0.4 mg total) under the tongue every 5 (five) minutes as needed for chest pain.  25 tablet  3  . simvastatin (ZOCOR) 40 MG tablet Take 1 tablet (40 mg total) by mouth at bedtime.  30 tablet  11   No current facility-administered medications for this visit.    Allergies  Allergen Reactions  . Brilinta (Ticagrelor)     Arthralgias & myalgias    History   Social History  . Marital Status: Married    Spouse Name: N/A    Number of Children: 1  .  Years of Education: N/A   Occupational History  . maintenance     full time   Social History Main Topics  . Smoking status: Former Smoker    Quit date: 11/02/1984  . Smokeless tobacco: Never Used  . Alcohol Use: No  . Drug Use: No  . Sexually Active: Not on file   Other Topics Concern  . Not on file   Social History Narrative  . No narrative on file    Family History  Problem Relation Age of Onset  . Heart attack Brother 50  . Diabetes Neg Hx   . Stroke Neg Hx     Review of Systems:  As stated in the HPI and otherwise negative.   BP 132/73  Pulse 67  Ht 6' (1.829 m)  Wt 187 lb (84.823 kg)  BMI 25.36 kg/m2  Physical Examination: General: Well developed, well nourished, NAD HEENT: OP clear, mucus membranes moist SKIN: warm, dry. No rashes. Neuro: No focal deficits Musculoskeletal: Muscle strength 5/5 all ext Psychiatric: Mood and  affect normal Neck: No JVD, no carotid bruits, no thyromegaly, no lymphadenopathy. Lungs:Clear bilaterally, no wheezes, rhonci, crackles Cardiovascular: Regular rate and rhythm. No murmurs, gallops or rubs. Abdomen:Soft. Bowel sounds present. Non-tender.  Extremities: No lower extremity edema. Pulses are 2 + in the bilateral DP/PT.  EKG:  Cardiac cath 02/20/13: Left main: Distal 20% stenosis.  Left Anterior Descending Artery: Moderate caliber vessel that does not reach the apex. The ostium has a 30% stenosis. The mid vessel has diffuse plaque. The first diagonal branch is small to moderate in caliber (2.0 mm) and has tandem 70% stenoses.  Circumflex Artery: Large caliber vessel with large first obtuse marginal branch. The first OM branch arises early and has a 99% stenosis in the proximal segment. The mid segment of the OM branch has a 50% stenosis. Distally the OM branch bifurcates and there is complex 50% stenosis involving both vessels beyond the branch point, non-flow limiting. The AV groove Circumflex has 70% stenosis just after the takeoff of the OM branch but the vessel is small in caliber.  Right Coronary Artery: Large dominant vessel with severe calcification in the proximal and mid vessel. The proximal vessel has a 40% stenosis. The mid vessel has a patent stent. The distal stented segment is patent with minimal restenosis. The posterolateral branch is small in caliber and has a 50% stenosis. The PDA is moderate in caliber (2.25 mm vessel) and in the distal segment there is a 60% stenosis followed by a 80% stenosis.  Left Ventricular Angiogram: LVEF 45% with inferior wall hypokinesis.   Echo 02/19/13: Left ventricle: The cavity size was normal. Wall thickness was at the upper limits of normal. The estimated ejection fraction was 55%. There is hypokinesis of the mid-distalinferolateral myocardium. Doppler parameters are consistent with abnormal left ventricular relaxation (grade 1  diastolic dysfunction). - Aortic valve: Trileaflet; mildly calcified leaflets. Trivial regurgitation. - Mitral valve: Mild regurgitation. - Left atrium: The atrium was mildly dilated. - Right atrium: The atrium was normal in size. Echodensity at base of atrium likely prominent muscular ridge. Central venous pressure: 10mm Hg (est). - Tricuspid valve: Trivial regurgitation. - Pulmonary arteries: Systolic pressure could not be accurately estimated. - Pericardium, extracardiac: There was no pericardial effusion.  Assessment and Plan:   1. CAD: Stable. He has undergone DES placement in the RCA in setting of inferior STEMI in December 2013 and repeat catheterization 02/20/13 in setting of NSTEMI at which time a DES was  placed in the first OM branch. He is on ASA and Plavix. I will plan on continuing dual anti-platelet therapy for at least one year but likely for longer if he tolerates. Tolerating statin. No changes today.  2. HTN: BP is controlled. No changes.   3. Hyperlipidemia: He is on a statin. Will try Zocor 20 mg po QHS.

## 2013-04-25 NOTE — Patient Instructions (Signed)
Your physician wants you to follow-up in: 4 months. You will receive a reminder letter in the mail two months in advance. If you don't receive a letter, please call our office to schedule the follow-up appointment.  

## 2013-06-15 ENCOUNTER — Telehealth: Payer: Self-pay | Admitting: Internal Medicine

## 2013-06-15 ENCOUNTER — Telehealth: Payer: Self-pay | Admitting: Cardiovascular Disease

## 2013-06-15 DIAGNOSIS — I10 Essential (primary) hypertension: Secondary | ICD-10-CM

## 2013-06-15 DIAGNOSIS — E785 Hyperlipidemia, unspecified: Secondary | ICD-10-CM

## 2013-06-15 DIAGNOSIS — Z0000 Encounter for general adult medical examination without abnormal findings: Secondary | ICD-10-CM

## 2013-06-15 DIAGNOSIS — T887XXA Unspecified adverse effect of drug or medicament, initial encounter: Secondary | ICD-10-CM

## 2013-06-15 DIAGNOSIS — I2510 Atherosclerotic heart disease of native coronary artery without angina pectoris: Secondary | ICD-10-CM

## 2013-06-15 DIAGNOSIS — E1159 Type 2 diabetes mellitus with other circulatory complications: Secondary | ICD-10-CM

## 2013-06-15 NOTE — Telephone Encounter (Signed)
Left message with patient's wife to have patient return call when available. Reason for call- inform patient that insurance may not cover labs prior to appointment and he should check first to avoid getting billed for labs. If labs covered, I will gladly place orders

## 2013-06-15 NOTE — Telephone Encounter (Signed)
Patient calling back to let us know he spoke with [facility] and was informed that they will cover labs prior to appointment.

## 2013-06-15 NOTE — Telephone Encounter (Signed)
Dennie Bible, We can set him up for an exercise stress myoview. Thayer Ohm

## 2013-06-15 NOTE — Telephone Encounter (Signed)
Spoke with patient who states Dr. Clifton James wants him to have a stress test in October but when he called our office to schedule it that he was told that there was no order for a stress test for him.  I have reviewed patient's chart and do not see order.  I advised patient that I will send note to Dr. Clifton James for order.  Patient would like stress test done the day before ov if possible.

## 2013-06-15 NOTE — Telephone Encounter (Signed)
New Prob  Pt states he was advised by Dr Clifton James that he is suppose to have a stress test. He wants to know when he needs to have this done.

## 2013-06-15 NOTE — Telephone Encounter (Signed)
Patient returned our call. He will check with [employer] and get back to [place].

## 2013-06-15 NOTE — Telephone Encounter (Signed)
Patient has made appt for CPE on 08/30/13. He would like to have labs done here at [facility]. Can you place orders please? Patient has [employer].

## 2013-06-16 NOTE — Telephone Encounter (Signed)
Spoke with pt and exercise myoview scheduled for August 07, 2013 at 7:30. I verbally went over all instructions with pt and will mail copy to him

## 2013-07-04 ENCOUNTER — Telehealth: Payer: Self-pay | Admitting: Internal Medicine

## 2013-07-04 DIAGNOSIS — E119 Type 2 diabetes mellitus without complications: Secondary | ICD-10-CM

## 2013-07-04 MED ORDER — METFORMIN HCL 500 MG PO TABS: ORAL_TABLET | ORAL | Status: DC

## 2013-07-04 NOTE — Telephone Encounter (Signed)
Rf for metformin sent to [facility] in [place]

## 2013-07-04 NOTE — Telephone Encounter (Signed)
Patient is calling to request a refill on his medication: metFORMIN (GLUCOPHAGE) 500 MG tablet. He does not want it sent to [facility] but instead would like for it to be sent to [facility] in [place]. Please advise.

## 2013-08-07 ENCOUNTER — Ambulatory Visit (HOSPITAL_COMMUNITY): Payer: BC Managed Care – PPO | Attending: Cardiology | Admitting: Radiology

## 2013-08-07 VITALS — BP 133/75 | HR 64 | Ht 72.0 in | Wt 188.0 lb

## 2013-08-07 DIAGNOSIS — I214 Non-ST elevation (NSTEMI) myocardial infarction: Secondary | ICD-10-CM

## 2013-08-07 DIAGNOSIS — Z8249 Family history of ischemic heart disease and other diseases of the circulatory system: Secondary | ICD-10-CM | POA: Insufficient documentation

## 2013-08-07 DIAGNOSIS — I2510 Atherosclerotic heart disease of native coronary artery without angina pectoris: Secondary | ICD-10-CM

## 2013-08-07 DIAGNOSIS — R0989 Other specified symptoms and signs involving the circulatory and respiratory systems: Secondary | ICD-10-CM | POA: Insufficient documentation

## 2013-08-07 DIAGNOSIS — Z9861 Coronary angioplasty status: Secondary | ICD-10-CM | POA: Insufficient documentation

## 2013-08-07 DIAGNOSIS — Z87891 Personal history of nicotine dependence: Secondary | ICD-10-CM | POA: Insufficient documentation

## 2013-08-07 DIAGNOSIS — E785 Hyperlipidemia, unspecified: Secondary | ICD-10-CM | POA: Insufficient documentation

## 2013-08-07 DIAGNOSIS — I252 Old myocardial infarction: Secondary | ICD-10-CM | POA: Insufficient documentation

## 2013-08-07 DIAGNOSIS — R0609 Other forms of dyspnea: Secondary | ICD-10-CM | POA: Insufficient documentation

## 2013-08-07 DIAGNOSIS — E119 Type 2 diabetes mellitus without complications: Secondary | ICD-10-CM | POA: Insufficient documentation

## 2013-08-07 DIAGNOSIS — I4949 Other premature depolarization: Secondary | ICD-10-CM

## 2013-08-07 DIAGNOSIS — R002 Palpitations: Secondary | ICD-10-CM | POA: Insufficient documentation

## 2013-08-07 DIAGNOSIS — I10 Essential (primary) hypertension: Secondary | ICD-10-CM | POA: Insufficient documentation

## 2013-08-07 DIAGNOSIS — R0602 Shortness of breath: Secondary | ICD-10-CM

## 2013-08-07 MED ORDER — TECHNETIUM TC 99M SESTAMIBI GENERIC - CARDIOLITE
33.0000 | Freq: Once | INTRAVENOUS | Status: AC | PRN
  Administered 2013-08-07: 33 via INTRAVENOUS

## 2013-08-07 MED ORDER — TECHNETIUM TC 99M SESTAMIBI GENERIC - CARDIOLITE
11.0000 | Freq: Once | INTRAVENOUS | Status: AC | PRN
  Administered 2013-08-07: 11 via INTRAVENOUS

## 2013-08-07 NOTE — Progress Notes (Signed)
[facility] SITE 3 NUCLEAR MED [address] [place], [place] [address] [phone]    Cardiology Nuclear Med Study  Gregory Bates is a 72 y.o. male     MRN : [id number]     DOB: 10-26-1941  Procedure Date: 08/07/2013  Nuclear Med Background Indication for Stress Test:  Evaluation for Ischemia and Stent Patency History:  ~30 yrs ago [id number] per patient; '13 MI>Stent-RCA; 4/14 MI>Cath:RCA stent patent,Stent-OM, EF=45%; 4/14 Echo:EF=55%, Cardiac Risk Factors: Family History - CAD, History of Smoking, Hypertension, Lipids and NIDDM  Symptoms:  DOE and Palpitations   Nuclear Pre-Procedure Caffeine/Decaff Intake:  None NPO After: 12:00am   Lungs:  Clear. O2 Sat: 99% on room air. IV 0.9% NS with Angio Cath:  20g  IV Site: R Hand  IV Started by:  Cathlyn Parsons, RN  Chest Size (in):  42 Cup Size: n/a  Height: 6' (1.829 m)  Weight:  188 lb (85.276 kg)  BMI:  Body mass index is 25.49 kg/(m^2). Tech Comments:  No Lopressor x 12 hrs    Nuclear Med Study 1 or 2 day study: 1 day  Stress Test Type:  Stress  Reading MD: Olga Millers, MD  Order Authorizing Provider:  Verne Carrow, MD  Resting Radionuclide: Technetium 14m Sestamibi  Resting Radionuclide Dose: 11.0 mCi   Stress Radionuclide:  Technetium 35m Sestamibi  Stress Radionuclide Dose: 33.0 mCi           Stress Protocol Rest HR: 64 Stress HR: 131  Rest BP: 133/75 Stress BP: 218/77  Exercise Time (min): 6:01 METS: 7.0   Predicted Max HR: 148 bpm % Max HR: 88.51 bpm Rate Pressure Product: 46962   Dose of Adenosine (mg):  n/a Dose of Lexiscan: n/a mg  Dose of Atropine (mg): n/a Dose of Dobutamine: n/a mcg/kg/min (at max HR)  Stress Test Technologist: Smiley Houseman, CMA-N  Nuclear Technologist:  Domenic Polite, CNMT     Rest Procedure:  Myocardial perfusion imaging was performed at rest 45 minutes following the intravenous administration of Technetium 76m Sestamibi.  Rest ECG: NSR, CRO prior  inferior MI, nonspecific ST changes.  Stress Procedure:  The patient exercised on the treadmill utilizing the Alexy Protocol for 6:01 minutes. The patient stopped due to fatigue and denied any chest pain.  There were occasional PVC's and rare PAC's.  Technetium 46m Sestamibi was injected at peak exercise and myocardial perfusion imaging was performed after a brief delay.  Stress ECG: Significant ST abnormalities consistent with ischemia.  QPS Raw Data Images:  Acquisition technically good; normal left ventricular size. Stress Images:  There is decreased uptake in the inferior wall. Rest Images:  There is decreased uptake in the inferior wall. Subtraction (SDS):  There is a fixed defect that is most consistent with a previous infarction. Transient Ischemic Dilatation (Normal <1.22):  n/a Lung/Heart Ratio (Normal <0.45):  0.38  Quantitative Gated Spect Images QGS EDV:  119 ml QGS ESV:  67 ml  Impression Exercise Capacity:  Fair exercise capacity. BP Response:  Hypertensive blood pressure response. Clinical Symptoms:  There is dyspnea. ECG Impression:  Significant ST abnormalities consistent with ischemia. Comparison with Prior Nuclear Study: No images to compare  Overall Impression:  Low risk stress nuclear study with a large, severe, fixed inferior defect consistent with prior infarct; no ischemia.  LV Ejection Fraction: 44%.  LV Wall Motion:  Inferior akinesis.   Olga Millers

## 2013-08-09 ENCOUNTER — Telehealth: Payer: Self-pay | Admitting: Cardiovascular Disease

## 2013-08-21 ENCOUNTER — Other Ambulatory Visit (INDEPENDENT_AMBULATORY_CARE_PROVIDER_SITE_OTHER): Payer: BC Managed Care – PPO

## 2013-08-21 DIAGNOSIS — T887XXA Unspecified adverse effect of drug or medicament, initial encounter: Secondary | ICD-10-CM

## 2013-08-21 DIAGNOSIS — E785 Hyperlipidemia, unspecified: Secondary | ICD-10-CM

## 2013-08-21 DIAGNOSIS — Z0000 Encounter for general adult medical examination without abnormal findings: Secondary | ICD-10-CM

## 2013-08-21 DIAGNOSIS — I10 Essential (primary) hypertension: Secondary | ICD-10-CM

## 2013-08-21 DIAGNOSIS — E1159 Type 2 diabetes mellitus with other circulatory complications: Secondary | ICD-10-CM

## 2013-08-21 LAB — CBC WITH DIFFERENTIAL/PLATELET
Basophils Absolute: 0.1 10*3/uL (ref 0.0–0.1)
Eosinophils Relative: 4.6 % (ref 0.0–5.0)
Lymphs Abs: 1.9 10*3/uL (ref 0.7–4.0)
Monocytes Relative: 8.1 % (ref 3.0–12.0)
Neutrophils Relative %: 62.6 % (ref 43.0–77.0)
Platelets: 220 10*3/uL (ref 150.0–400.0)
RDW: 13.9 % (ref 11.5–14.6)
WBC: 8.1 10*3/uL (ref 4.5–10.5)

## 2013-08-21 LAB — HEPATIC FUNCTION PANEL
AST: 19 U/L (ref 0–37)
Albumin: 4.3 g/dL (ref 3.5–5.2)
Alkaline Phosphatase: 48 U/L (ref 39–117)
Bilirubin, Direct: 0 mg/dL (ref 0.0–0.3)
Total Protein: 7.3 g/dL (ref 6.0–8.3)

## 2013-08-21 LAB — BASIC METABOLIC PANEL
CO2: 29 mEq/L (ref 19–32)
Calcium: 9.6 mg/dL (ref 8.4–10.5)
GFR: 54.16 mL/min — ABNORMAL LOW (ref >60.00)
Glucose, Bld: 122 mg/dL — ABNORMAL HIGH (ref 70–99)
Potassium: 4.4 mEq/L (ref 3.5–5.1)
Sodium: 138 mEq/L (ref 135–145)

## 2013-08-21 LAB — LIPID PANEL
HDL: 35.4 mg/dL — ABNORMAL LOW (ref >39.00)
Total CHOL/HDL Ratio: 5
VLDL: 33.4 mg/dL (ref 0.0–40.0)

## 2013-08-21 LAB — MICROALBUMIN / CREATININE URINE RATIO
Creatinine,U: 110.4 mg/dL
Microalb Creat Ratio: 1.4 mg/g (ref 0.0–30.0)
Microalb, Ur: 1.5 mg/dL (ref 0.0–1.9)

## 2013-08-21 LAB — TSH: TSH: 0.99 u[IU]/mL (ref 0.35–5.50)

## 2013-08-21 LAB — HEMOGLOBIN A1C: Hgb A1c MFr Bld: 6.9 % — ABNORMAL HIGH (ref 4.6–6.5)

## 2013-08-28 ENCOUNTER — Telehealth: Payer: Self-pay

## 2013-08-28 NOTE — Telephone Encounter (Signed)
Medication List and allergies: reviewed and updated  90 day supply/mail order: na Local prescriptions: [facility]  Immunizations due: wants flu and shingles  A/P:  [facility]--2002 per [facility]--Dr Perry---Patient states 5-6 years ago- No PSA in chart Request flu and zostavax  No PSH or FH changes  To Discuss with Provider: No questions at this time

## 2013-08-29 ENCOUNTER — Ambulatory Visit (INDEPENDENT_AMBULATORY_CARE_PROVIDER_SITE_OTHER): Payer: BC Managed Care – PPO | Admitting: Cardiovascular Disease

## 2013-08-29 ENCOUNTER — Encounter: Payer: Self-pay | Admitting: Cardiovascular Disease

## 2013-08-29 VITALS — BP 128/62 | HR 72 | Ht 72.0 in | Wt 188.0 lb

## 2013-08-29 DIAGNOSIS — I2510 Atherosclerotic heart disease of native coronary artery without angina pectoris: Secondary | ICD-10-CM

## 2013-08-29 DIAGNOSIS — E785 Hyperlipidemia, unspecified: Secondary | ICD-10-CM

## 2013-08-29 DIAGNOSIS — I10 Essential (primary) hypertension: Secondary | ICD-10-CM

## 2013-08-29 MED ORDER — METOPROLOL TARTRATE 25 MG PO TABS: 25.0000 mg | ORAL_TABLET | Freq: Two times a day (BID) | ORAL | Status: DC

## 2013-08-29 MED ORDER — NITROGLYCERIN 0.4 MG SL SUBL: 0.4000 mg | SUBLINGUAL_TABLET | SUBLINGUAL | Status: DC | PRN

## 2013-08-29 MED ORDER — SIMVASTATIN 40 MG PO TABS: 40.0000 mg | ORAL_TABLET | Freq: Every day | ORAL | Status: DC

## 2013-08-29 MED ORDER — CLOPIDOGREL BISULFATE 75 MG PO TABS: 75.0000 mg | ORAL_TABLET | Freq: Every day | ORAL | Status: DC

## 2013-08-29 NOTE — Patient Instructions (Signed)
Your physician wants you to follow-up in:  6 months. You will receive a reminder letter in the mail two months in advance. If you don't receive a letter, please call our office to schedule the follow-up appointment.   

## 2013-08-29 NOTE — Progress Notes (Signed)
History of Present Illness: 72 yo WM with history of DM, HTN, hyperlipidemia and CAD with admission to [facility] April 2014 with with NSTEMI. He is here today for follow up. Admitted to [facility] 10/02/12 with inferior STEMI and found to have occluded RCA treated with DES. He did well and was discharged on 10/04/12. Readmitted 02/17/13 with NSTEMI. DES placed in severe first OM stenosis. He has diffuse moderate disease in all vessels. Stress myoview 08/07/13 with LVEF-=44%, inferior scar, no ischemia.   He is here today for follow up. No chest pain, SOB, palpitations, near syncope or syncope. He has not tolerated Crestor or Lipitor. He is tolerating his Zocor by taking 20 mg per day. 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 He is walking 1 mile per day.   Primary Care Physician: Alwyn Ren  Last Lipid Profile:Lipid Panel     Component Value Date/Time   CHOL 165 08/21/2013 0820   TRIG 167.0* 08/21/2013 0820   HDL 35.40* 08/21/2013 0820   CHOLHDL 5 08/21/2013 0820   VLDL 33.4 08/21/2013 0820   LDLCALC 96 08/21/2013 0820     Past Medical History  Diagnosis Date  . CAD (coronary artery disease), native coronary artery     a. s/p cath May 2011 with placement of DES distal RCA;  b. 01/2013 NSTEMI/Cath/PCI: LM 20d, LAD 30ost, D1 70, LCX 25m, OM1 99p (2.75x12 Promus Premier DES), 20m, 50d in branches, RCA Ca2+ prox, 40p, patent stents mid/dist, RPL 50 small, PDA 60/80, EF 45% inf HK.  [name] Kitchen Hypertension   . Hyperlipidemia   . Diabetes mellitus     AODM  . Anemia, unspecified   . Colorectal polyps     recurrent  . Erectile dysfunction   . Diastolic dysfunction     a. 01/2013 Echo: EF 55%, mid-dist inflat HK, Gr1 DD, Triv AI/TR, Mild MR.    Past Surgical History  Procedure Laterality Date  . Wisdom tooth extraction    . Colonoscopy w/ polypectomy  2002  . Infantile paralysis facial asymmetry    . Coronary stent placement  2007, 2011  . Colonoscopy w/ polypectomy  2004;  2007 negative    Current Outpatient Prescriptions  Medication Sig Dispense Refill  . aspirin 81 MG tablet Take 81 mg by mouth daily.      . clopidogrel (PLAVIX) 75 MG tablet Take 1 tablet (75 mg total) by mouth daily.  30 tablet  6  . fexofenadine (ALLEGRA) 180 MG tablet Take 180 mg by mouth daily.        . metFORMIN (GLUCOPHAGE) 500 MG tablet TAKE 1 TABLET BY MOUTH TWICE DAILY WITH MEALS  180 tablet  3  . metoprolol tartrate (LOPRESSOR) 25 MG tablet Take 1 tablet (25 mg total) by mouth 2 (two) times daily.  60 tablet  6  . Multiple Vitamin (MULTIVITAMIN) tablet Take 1 tablet by mouth daily.        . nitroGLYCERIN (NITROSTAT) 0.4 MG SL tablet Place 1 tablet (0.4 mg total) under the tongue every 5 (five) minutes as needed for chest pain.  25 tablet  3  . simvastatin (ZOCOR) 40 MG tablet Take 1 tablet (40 mg total) by mouth at bedtime.  30 tablet  6   No current facility-administered medications for this visit.    Allergies  Allergen Reactions  . Brilinta [Ticagrelor]     Arthralgias & myalgias    History   Social History  . Marital Status: Married  Spouse Name: N/A    Number of Children: 1  . Years of Education: N/A   Occupational History  . maintenance     full time   Social History Main Topics  . Smoking status: Former Smoker    Quit date: 11/02/1984  . Smokeless tobacco: Never Used  . Alcohol Use: No  . Drug Use: No  . Sexual Activity: Not on file   Other Topics Concern  . Not on file   Social History Narrative  . No narrative on file    Family History  Problem Relation Age of Onset  . Heart attack Brother 50  . Diabetes Neg Hx   . Stroke Neg Hx     Review of Systems:  As stated in the HPI and otherwise negative.   BP 128/62  Pulse 72  Ht 6' (1.829 m)  Wt 188 lb (85.276 kg)  BMI 25.49 kg/m2  SpO2 98%  Physical Examination: General: Well developed, well nourished, NAD HEENT: OP clear, mucus membranes moist SKIN: warm, dry. No rashes. Neuro: No  focal deficits Musculoskeletal: Muscle strength 5/5 all ext Psychiatric: Mood and affect normal Neck: No JVD, no carotid bruits, no thyromegaly, no lymphadenopathy. Lungs:Clear bilaterally, no wheezes, rhonci, crackles Cardiovascular: Regular rate and rhythm. No murmurs, gallops or rubs. Abdomen:Soft. Bowel sounds present. Non-tender.  Extremities: No lower extremity edema. Pulses are 2 + in the bilateral DP/PT.  Cardiac cath 02/20/13: Left main: Distal 20% stenosis.  Left Anterior Descending Artery: Moderate caliber vessel that does not reach the apex. The ostium has a 30% stenosis. The mid vessel has diffuse plaque. The first diagonal branch is small to moderate in caliber (2.0 mm) and has tandem 70% stenoses.  Circumflex Artery: Large caliber vessel with large first obtuse marginal branch. The first OM branch arises early and has a 99% stenosis in the proximal segment. The mid segment of the OM branch has a 50% stenosis. Distally the OM branch bifurcates and there is complex 50% stenosis involving both vessels beyond the branch point, non-flow limiting. The AV groove Circumflex has 70% stenosis just after the takeoff of the OM branch but the vessel is small in caliber.  Right Coronary Artery: Large dominant vessel with severe calcification in the proximal and mid vessel. The proximal vessel has a 40% stenosis. The mid vessel has a patent stent. The distal stented segment is patent with minimal restenosis. The posterolateral branch is small in caliber and has a 50% stenosis. The PDA is moderate in caliber (2.25 mm vessel) and in the distal segment there is a 60% stenosis followed by a 80% stenosis.  Left Ventricular Angiogram: LVEF 45% with inferior wall hypokinesis.   Echo 02/19/13: Left ventricle: The cavity size was normal. Wall thickness was at the upper limits of normal. The estimated ejection fraction was 55%. There is hypokinesis of the mid-distalinferolateral myocardium. Doppler  parameters are consistent with abnormal left ventricular relaxation (grade 1 diastolic dysfunction). - Aortic valve: Trileaflet; mildly calcified leaflets. Trivial regurgitation. - Mitral valve: Mild regurgitation. - Left atrium: The atrium was mildly dilated. - Right atrium: The atrium was normal in size. Echodensity at base of atrium likely prominent muscular ridge. Central venous pressure: 10mm Hg (est). - Tricuspid valve: Trivial regurgitation. - Pulmonary arteries: Systolic pressure could not be accurately estimated. - Pericardium, extracardiac: There was no pericardial Effusion.  Stress myoview 08/07/13:  Stress Procedure: The patient exercised on the treadmill utilizing the Reginal Protocol for 6:01 minutes. The patient stopped due to fatigue  and denied any chest pain. There were occasional PVC's and rare PAC's. Technetium 56m Sestamibi was injected at peak exercise and myocardial perfusion imaging was performed after a brief delay.  Stress ECG: Significant ST abnormalities consistent with ischemia.  QPS  Raw Data Images: Acquisition technically good; normal left ventricular size.  Stress Images: There is decreased uptake in the inferior wall.  Rest Images: There is decreased uptake in the inferior wall.  Subtraction (SDS): There is a fixed defect that is most consistent with a previous infarction.  Transient Ischemic Dilatation (Normal <1.22): n/a  Lung/Heart Ratio (Normal <0.45): 0.38  Quantitative Gated Spect Images  QGS EDV: 119 ml  QGS ESV: 67 ml  Impression  Exercise Capacity: Fair exercise capacity.  BP Response: Hypertensive blood pressure response.  Clinical Symptoms: There is dyspnea.  ECG Impression: Significant ST abnormalities consistent with ischemia.  Comparison with Prior Nuclear Study: No images to compare  Overall Impression: Low risk stress nuclear study with a large, severe, fixed inferior defect consistent with prior infarct; no ischemia.  LV Ejection  Fraction: 44%. LV Wall Motion: Inferior akinesis.   Assessment and Plan:   1. CAD: Stable. He has undergone DES placement in the RCA in setting of inferior STEMI in December 2013 and repeat catheterization 02/20/13 in setting of NSTEMI at which time a DES was placed in the first OM branch. He is on ASA and Plavix. I will plan on continuing dual anti-platelet therapy for at least one year but likely for longer if he tolerates. Tolerating statin. Stress test without ischemia 08/07/13.  No changes today.   2. HTN: BP is controlled. No changes.   3. Hyperlipidemia: He is on a statin. Will continue Zocor 20 mg po QHS.

## 2013-08-29 NOTE — Addendum Note (Signed)
Addended by: Dossie Arbour on: 08/29/2013 04:14 PM   Modules accepted: Orders

## 2013-08-30 ENCOUNTER — Encounter: Payer: Self-pay | Admitting: Internal Medicine

## 2013-08-30 ENCOUNTER — Ambulatory Visit (INDEPENDENT_AMBULATORY_CARE_PROVIDER_SITE_OTHER): Payer: BC Managed Care – PPO | Admitting: Internal Medicine

## 2013-08-30 VITALS — BP 120/70 | HR 60 | Temp 98.3°F | Ht 71.25 in | Wt 190.2 lb

## 2013-08-30 DIAGNOSIS — D126 Benign neoplasm of colon, unspecified: Secondary | ICD-10-CM

## 2013-08-30 DIAGNOSIS — E119 Type 2 diabetes mellitus without complications: Secondary | ICD-10-CM

## 2013-08-30 DIAGNOSIS — Z0000 Encounter for general adult medical examination without abnormal findings: Secondary | ICD-10-CM

## 2013-08-30 DIAGNOSIS — Z23 Encounter for immunization: Secondary | ICD-10-CM

## 2013-08-30 DIAGNOSIS — I2510 Atherosclerotic heart disease of native coronary artery without angina pectoris: Secondary | ICD-10-CM

## 2013-08-30 NOTE — Progress Notes (Signed)
Subjective:    Patient ID: Gregory Bates, male    DOB: 1940-11-23, 72 y.o.   MRN: [id number]  HPI  He is here for a non Medicare Wellness  physical;acute issues denied.    Review of Systems  His fasting blood sugars average approximately 130. He denies polyuria, polydipsia, or polyuria. He has no nonhealing skin lesions. He also denies numbness, tingling, burning extremities. He has rare hypoglycemic spells related to exercise. His most recent A1c was 6.9%; urine microalbumin was normal. Ophthalmologic exam is current. He's had no podiatry followup. A heart healthy diet is followed; exercise encompasses 15  Minutes 5  times per week as walking  1 mpd  without symptoms. Specifically denied are  chest pain, palpitations, dyspnea, or claudication.  Family history is negative for premature coronary disease. LDL goal is less than 70 with CAD. There is medication compliance with the statin. Significant abdominal symptoms, memory deficit, or myalgias denied.  As noted he has no abdominal symptoms; but he does have a past history of colon polyp in 2002. Apparently his most recent colonoscopy was in 2007 and was negative. His brother did have colon cancer. Problematic is the fact that he is now on generic Plavix.     Objective:   Physical Exam  Gen.: Healthy and well-nourished in appearance. Alert, appropriate and cooperative throughout exam. Head: Normocephalic without obvious abnormalities; moustache & beard; pattern alopecia  Eyes: No corneal or conjunctival inflammation noted. Pupils equal round reactive to light and accommodation.  Extraocular motion intact.Slight ptosis Ears: External  ear exam reveals no significant lesions or deformities. Canals clear .TMs normal.  Nose: External nasal exam reveals no deformity or inflammation. Nasal mucosa are pink and moist. No lesions or exudates noted.  Mouth: Oral mucosa and oropharynx reveal no lesions or exudates. Dentures in good repair. Neck: No  deformities, masses, or tenderness noted. Range of motion decreased. Thyroid substernal. Lungs: Normal respiratory effort; chest expands symmetrically. Lungs are clear to auscultation without rales, wheezes, or increased work of breathing. Heart: Normal rate and rhythm. Normal S1 and S2. No gallop, click, or rub. No murmur. Abdomen: Bowel sounds normal; abdomen soft and nontender. No masses, organomegaly or hernias noted. Genitalia: Genitalia normal except for left varices. Slight weakness L inguinal area. Prostate is normal without enlargement, asymmetry, nodularity, or induration.                           Musculoskeletal/extremities: No deformity or scoliosis noted of  the thoracic or lumbar spine.  No clubbing, cyanosis, edema, or significant extremity  deformity noted. Range of motion normal .Tone & strength  Normal. Joints normal . Nail health good. Able to lie down & sit up w/o help. Negative SLR bilaterally Vascular: Carotid, radial artery, dorsalis pedis and  posterior tibial pulses are equal. Decreased pedal pulses ,especially DPP.No bruits present. Neurologic: Alert and oriented x3. Deep tendon reflexes symmetrical and normal.   Light touch normal over feet.   Skin: Intact without suspicious lesions or rashes. Lymph: No cervical, axillary, or inguinal lymphadenopathy present. Psych: Mood and affect are normal. Normally interactive  Assessment & Plan:  #1 comprehensive physical exam; no acute findings  Plan: see Orders  & Recommendations

## 2013-08-30 NOTE — Patient Instructions (Addendum)
Your next office appointment will be in 4 months for A1c. Please report any low blood sugars immediately. As per the Standard of Care , screening Colonoscopy recommended @ 50 & every 5-10 years thereafter . More frequent monitor would be dictated by family history or findings @ Colonoscopy. GI will be contacted as to F/U.

## 2013-09-01 ENCOUNTER — Telehealth: Payer: Self-pay

## 2013-09-01 NOTE — Telephone Encounter (Signed)
Message copied by Chrystie Nose on Fri Sep 01, 2013 12:54 PM ------      Message from: Hilarie Fredrickson      Created: Fri Sep 01, 2013 12:48 PM       Yes, he appears to be overdue. Please share with Dr. Alwyn Ren and have all reports and path scanned into EPIC ASAP. Also, send patient a letter to schedule follow up colonoscopy. Thanks (as always, make hard copy of this for chart)      ----- Message -----         From: Lily Lovings, RN         Sent: 09/01/2013  11:17 AM           To: Hilarie Fredrickson, MD            Dr. Marina Goodell,            There are 2 colon reports on this pt that I could find. The 1st was done 07/12/01 and several polyps were removed. He was supposed to repeat colon in 1 yr. Second colon was done 12/20/02  And polyp was removed, report states repeat colon in 2 years. I cannot find any other colons. Looks like he is way overdue by the last report. Do you agree?            Bonita Quin            ----- Message -----         From: Hilarie Fredrickson, MD         Sent: 08/30/2013  10:28 AM           To: Lily Lovings, RN, Pecola Lawless, MD            Bonita Quin, look this up for Dr. hopper and let him know. Thanks            ----- Message -----         From: Pecola Lawless, MD         Sent: 08/30/2013   9:33 AM           To: Hilarie Fredrickson, MD            Please verify when follow up colonoscopy is due based on your records                   ------

## 2013-09-01 NOTE — Telephone Encounter (Signed)
Colon reports have been scanned into epic under procedures. Pt is due for colonoscopy and letter mailed to pt regarding scheduling his procedure. Dr. Alwyn Ren notified.

## 2013-11-03 ENCOUNTER — Telehealth: Payer: Self-pay | Admitting: Cardiovascular Disease

## 2013-11-03 ENCOUNTER — Inpatient Hospital Stay (HOSPITAL_COMMUNITY)
Admission: EM | Admit: 2013-11-03 | Discharge: 2013-11-05 | DRG: 310 | Discharge status: Against Medical Advice | Payer: BC Managed Care – PPO | Attending: Cardiovascular Disease | Admitting: Cardiovascular Disease

## 2013-11-03 ENCOUNTER — Emergency Department (HOSPITAL_COMMUNITY): Payer: BC Managed Care – PPO

## 2013-11-03 ENCOUNTER — Encounter (HOSPITAL_COMMUNITY): Payer: Self-pay | Admitting: Emergency Medicine

## 2013-11-03 DIAGNOSIS — I4892 Unspecified atrial flutter: Secondary | ICD-10-CM

## 2013-11-03 DIAGNOSIS — Z800 Family history of malignant neoplasm of digestive organs: Secondary | ICD-10-CM

## 2013-11-03 DIAGNOSIS — N529 Male erectile dysfunction, unspecified: Secondary | ICD-10-CM | POA: Diagnosis present

## 2013-11-03 DIAGNOSIS — Z8249 Family history of ischemic heart disease and other diseases of the circulatory system: Secondary | ICD-10-CM

## 2013-11-03 DIAGNOSIS — E785 Hyperlipidemia, unspecified: Secondary | ICD-10-CM

## 2013-11-03 DIAGNOSIS — R079 Chest pain, unspecified: Secondary | ICD-10-CM

## 2013-11-03 DIAGNOSIS — I10 Essential (primary) hypertension: Secondary | ICD-10-CM

## 2013-11-03 DIAGNOSIS — D649 Anemia, unspecified: Secondary | ICD-10-CM | POA: Diagnosis present

## 2013-11-03 DIAGNOSIS — I252 Old myocardial infarction: Secondary | ICD-10-CM

## 2013-11-03 DIAGNOSIS — I2510 Atherosclerotic heart disease of native coronary artery without angina pectoris: Secondary | ICD-10-CM

## 2013-11-03 DIAGNOSIS — E1159 Type 2 diabetes mellitus with other circulatory complications: Secondary | ICD-10-CM

## 2013-11-03 DIAGNOSIS — I4891 Unspecified atrial fibrillation: Principal | ICD-10-CM

## 2013-11-03 DIAGNOSIS — E119 Type 2 diabetes mellitus without complications: Secondary | ICD-10-CM | POA: Diagnosis present

## 2013-11-03 DIAGNOSIS — Z7982 Long term (current) use of aspirin: Secondary | ICD-10-CM

## 2013-11-03 DIAGNOSIS — Z9861 Coronary angioplasty status: Secondary | ICD-10-CM

## 2013-11-03 DIAGNOSIS — Z87891 Personal history of nicotine dependence: Secondary | ICD-10-CM

## 2013-11-03 HISTORY — DX: Disorder of kidney and ureter, unspecified: N28.9

## 2013-11-03 LAB — CBC WITH DIFFERENTIAL/PLATELET
Basophils Absolute: 0 10*3/uL (ref 0.0–0.1)
Basophils Relative: 1 % (ref 0–1)
EOS ABS: 0.4 10*3/uL (ref 0.0–0.7)
Eosinophils Relative: 5 % (ref 0–5)
HEMATOCRIT: 39.7 % (ref 39.0–52.0)
Hemoglobin: 13.7 g/dL (ref 13.0–17.0)
LYMPHS ABS: 1.7 10*3/uL (ref 0.7–4.0)
LYMPHS PCT: 22 % (ref 12–46)
MCH: 30.4 pg (ref 26.0–34.0)
MCHC: 34.5 g/dL (ref 30.0–36.0)
MCV: 88.2 fL (ref 78.0–100.0)
MONO ABS: 0.8 10*3/uL (ref 0.1–1.0)
Monocytes Relative: 10 % (ref 3–12)
Neutro Abs: 5.1 10*3/uL (ref 1.7–7.7)
Neutrophils Relative %: 63 % (ref 43–77)
Platelets: 190 10*3/uL (ref 150–400)
RBC: 4.5 MIL/uL (ref 4.22–5.81)
RDW: 12.6 % (ref 11.5–15.5)
WBC: 8.1 10*3/uL (ref 4.0–10.5)

## 2013-11-03 LAB — POCT I-STAT TROPONIN I: Troponin i, poc: 0.02 ng/mL (ref 0.00–0.08)

## 2013-11-03 LAB — POCT I-STAT, CHEM 8
BUN: 17 mg/dL (ref 6–23)
CHLORIDE: 100 meq/L (ref 96–112)
Calcium, Ion: 1.18 mmol/L (ref 1.13–1.30)
Creatinine, Ser: 1.6 mg/dL — ABNORMAL HIGH (ref 0.50–1.35)
GLUCOSE: 163 mg/dL — AB (ref 70–99)
HCT: 41 % (ref 39.0–52.0)
Hemoglobin: 13.9 g/dL (ref 13.0–17.0)
Potassium: 3.8 mEq/L (ref 3.7–5.3)
Sodium: 139 mEq/L (ref 137–147)
TCO2: 27 mmol/L (ref 0–100)

## 2013-11-03 LAB — GLUCOSE, CAPILLARY: Glucose-Capillary: 152 mg/dL — ABNORMAL HIGH (ref 70–99)

## 2013-11-03 LAB — PRO B NATRIURETIC PEPTIDE: Pro B Natriuretic peptide (BNP): 418.8 pg/mL — ABNORMAL HIGH (ref 0–125)

## 2013-11-03 MED ORDER — DILTIAZEM LOAD VIA INFUSION: 10.0000 mg | Freq: Once | INTRAVENOUS | Status: DC

## 2013-11-03 MED ORDER — ASPIRIN 81 MG PO CHEW
81.0000 mg | CHEWABLE_TABLET | Freq: Every day | ORAL | Status: DC
  Administered 2013-11-04 – 2013-11-05 (×2): 81 mg via ORAL
  Filled 2013-11-03 (×2): qty 1

## 2013-11-03 MED ORDER — NITROGLYCERIN 0.4 MG SL SUBL: 0.4000 mg | SUBLINGUAL_TABLET | SUBLINGUAL | Status: DC | PRN

## 2013-11-03 MED ORDER — CLOPIDOGREL BISULFATE 75 MG PO TABS
75.0000 mg | ORAL_TABLET | Freq: Every day | ORAL | Status: DC
  Administered 2013-11-04 – 2013-11-05 (×2): 75 mg via ORAL
  Filled 2013-11-03 (×2): qty 1

## 2013-11-03 MED ORDER — METOPROLOL TARTRATE 25 MG PO TABS
25.0000 mg | ORAL_TABLET | Freq: Four times a day (QID) | ORAL | Status: DC
  Administered 2013-11-04 – 2013-11-05 (×6): 25 mg via ORAL
  Filled 2013-11-03 (×10): qty 1

## 2013-11-03 MED ORDER — ONDANSETRON HCL 4 MG/2ML IJ SOLN: 4.0000 mg | Freq: Four times a day (QID) | INTRAMUSCULAR | Status: DC | PRN

## 2013-11-03 MED ORDER — SIMVASTATIN 40 MG PO TABS
40.0000 mg | ORAL_TABLET | Freq: Every day | ORAL | Status: DC
  Administered 2013-11-04 (×2): 40 mg via ORAL
  Filled 2013-11-03 (×3): qty 1

## 2013-11-03 MED ORDER — DILTIAZEM HCL 100 MG IV SOLR: 5.0000 mg/h | INTRAVENOUS | Status: DC

## 2013-11-03 MED ORDER — DILTIAZEM HCL 100 MG IV SOLR
5.0000 mg/h | INTRAVENOUS | Status: DC
  Administered 2013-11-03: 5 mg/h via INTRAVENOUS
  Administered 2013-11-03: 10 mg/h via INTRAVENOUS
  Filled 2013-11-03: qty 100

## 2013-11-03 MED ORDER — OFF THE BEAT BOOK
Freq: Once | Status: AC
  Administered 2013-11-04: 01:00:00
  Filled 2013-11-03: qty 1

## 2013-11-03 MED ORDER — DILTIAZEM LOAD VIA INFUSION
20.0000 mg | Freq: Once | INTRAVENOUS | Status: AC
  Administered 2013-11-03: 20 mg via INTRAVENOUS
  Filled 2013-11-03: qty 20

## 2013-11-03 MED ORDER — HEPARIN (PORCINE) IN NACL 100-0.45 UNIT/ML-% IJ SOLN
1250.0000 [IU]/h | INTRAMUSCULAR | Status: DC
  Administered 2013-11-04: 1250 [IU]/h via INTRAVENOUS
  Filled 2013-11-03 (×2): qty 250

## 2013-11-03 MED ORDER — METOPROLOL TARTRATE 25 MG PO TABS
25.0000 mg | ORAL_TABLET | Freq: Once | ORAL | Status: AC
  Administered 2013-11-03: 25 mg via ORAL
  Filled 2013-11-03: qty 1

## 2013-11-03 MED ORDER — ACETAMINOPHEN 325 MG PO TABS: 650.0000 mg | ORAL_TABLET | ORAL | Status: DC | PRN

## 2013-11-03 MED ORDER — INSULIN ASPART 100 UNIT/ML ~~LOC~~ SOLN
0.0000 [IU] | Freq: Three times a day (TID) | SUBCUTANEOUS | Status: DC
  Administered 2013-11-04: 2 [IU] via SUBCUTANEOUS
  Administered 2013-11-04: 3 [IU] via SUBCUTANEOUS
  Administered 2013-11-05: 2 [IU] via SUBCUTANEOUS

## 2013-11-03 MED ORDER — HEPARIN BOLUS VIA INFUSION
4000.0000 [IU] | Freq: Once | INTRAVENOUS | Status: AC
  Administered 2013-11-04: 4000 [IU] via INTRAVENOUS
  Filled 2013-11-03: qty 4000

## 2013-11-03 NOTE — ED Notes (Signed)
Per EMS pt came from home where he started having mid sternal CP at 1800 while sitting at dinner table after eating dinner. Pt took 324 ASA at home and 1 nitroglycerin. Pt received 2 additional nitroglycerin with EMS and pain decreased from 5/10-3/10. Pt also reports SOBx3 weeks. Denies cough. ST on monitor. Pt has hx of 4 MIs with stent placement.

## 2013-11-03 NOTE — Telephone Encounter (Signed)
C/o SOB x 1 week with exertion. No sob noted in speech. Denies cough/ wt gain- but has not weighed today usual wt is 185 lb per pt/no cp/ no edema. Pt requests to be seen by Dr Julianne Handler.  bp 155/81 p 78  App given, pt agreeable to plan.

## 2013-11-03 NOTE — H&P (Signed)
History and Physical  Patient ID: Gregory Bates MRN: [id number], DOB: [birth year] Date of Encounter: 11/03/2013, 10:17 PM Primary Physician: [name], MD Primary Cardiologist: Julianne Handler  Chief Complaint: shortness of breath, chest pain Reason for Admission: SOB, atrial fibrillation  HPI: 73 y/o male with history of CAD s/p multiple stents, HTN, HLD and DM (A1C 6.5) who presents with cc of chest pain and shortness of breath. Pt reports for the last 7 days he has been feeling progressively more short of breath with exertion.  Pt experienced an episode of chest pain the night of presentation described as central and left sided, 5/10, dull/pressure, no radiation, associated with shortness of breath, no diaphoresis/nausea/vomiting.  Pain lasted until arrival in the ER and subsided after receiving diltiazem for his rapid heart rate.  He reports this pain was different and not as severe as when he had chest pain with his prior CAD/stents.  He denies orthopnea or PND, has had significant DOE, no leg swelling, awareness of irregular heart beats, or syncope.  He denies any prior history of heart rhythm disturbances, no prior stroke.  He is currently chest pain free.   In the ER, Pt was tachycardic and thought to be in afib with RVR, given diltiazem bolus, and started on a diltiazem drip.    Past Medical History  Diagnosis Date  . CAD (coronary artery disease), native coronary artery     a. s/p cath May 2011 with placement of DES distal RCA;  b. 01/2013 NSTEMI/Cath/PCI: LM 20d, LAD 30ost, D1 70, LCX 20m, OM1 99p (2.75x12 Promus Premier DES), 56m, 50d in branches, RCA Ca2+ prox, 40p, patent stents mid/dist, RPL 50 small, PDA 60/80, EF 45% inf HK.  [name] Kitchen Hypertension   . Hyperlipidemia   . Diabetes mellitus     AODM  . Anemia, unspecified   . Colorectal polyps 2002  . Erectile dysfunction   . Diastolic dysfunction     a. 01/2013 Echo: EF 55%, mid-dist inflat HK, Gr1 DD, Triv AI/TR, Mild MR.     Most  Recent Cardiac Studies:    Surgical History:  Past Surgical History  Procedure Laterality Date  . Wisdom tooth extraction    . Colonoscopy w/ polypectomy  2002    [facility]  . Infantile paralysis facial asymmetry    . Coronary stent placement  2007, 2011  . Colonoscopy w/ polypectomy  2004; 2007 negative     Home Meds: Prior to Admission medications   Medication Sig Start Date End Date Taking? Authorizing Provider  aspirin 81 MG tablet Take 81 mg by mouth daily.   Yes Historical Provider, MD  calcium carbonate (OS-CAL) 600 MG TABS tablet Take 600 mg by mouth 2 (two) times daily with a meal.   Yes Historical Provider, MD  cholecalciferol (VITAMIN D) 1000 UNITS tablet Take 1,000 Units by mouth daily.   Yes Historical Provider, MD  clopidogrel (PLAVIX) 75 MG tablet Take 1 tablet (75 mg total) by mouth daily. 08/29/13  Yes Burnell Blanks, MD  fexofenadine (ALLEGRA) 180 MG tablet Take 180 mg by mouth daily.     Yes Historical Provider, MD  metFORMIN (GLUCOPHAGE) 500 MG tablet Take 500 mg by mouth 2 (two) times daily with a meal.   Yes Historical Provider, MD  metoprolol tartrate (LOPRESSOR) 25 MG tablet Take 1 tablet (25 mg total) by mouth 2 (two) times daily. 08/29/13  Yes Burnell Blanks, MD  Multiple Vitamin (MULTIVITAMIN) tablet Take 1 tablet by mouth daily.  Yes Historical Provider, MD  nitroGLYCERIN (NITROSTAT) 0.4 MG SL tablet Place 1 tablet (0.4 mg total) under the tongue every 5 (five) minutes as needed for chest pain. 08/29/13  Yes Burnell Blanks, MD  simvastatin (ZOCOR) 40 MG tablet Take 1 tablet (40 mg total) by mouth at bedtime. 08/29/13  Yes Burnell Blanks, MD  vitamin E 100 UNIT capsule Take 100 Units by mouth daily.   Yes Historical Provider, MD    Allergies:  Allergies  Allergen Reactions  . Brilinta [Ticagrelor]     Arthralgias & myalgias  . Crestor [Rosuvastatin]     Myalgias  . Lipitor [Atorvastatin]     Myalgias    History    Social History  . Marital Status: Married    Spouse Name: N/A    Number of Children: 1  . Years of Education: N/A   Occupational History  . maintenance     full time   Social History Main Topics  . Smoking status: Former Smoker    Quit date: 11/02/1984  . Smokeless tobacco: Never Used     Comment: smoked Allyn, up to 3 ppd (!)  . Alcohol Use: No  . Drug Use: No  . Sexual Activity: Not on file   Other Topics Concern  . Not on file   Social History Narrative  . No narrative on file     Family History  Problem Relation Age of Onset  . Heart attack Brother 42  . Colon cancer Brother   . Diabetes Neg Hx   . Stroke Neg Hx   . Hypertension Neg Hx     Review of Systems: as per HPI General: negative for chills, fever, night sweats or weight changes.  Cardiovascular: negative for edema, orthopnea, paroxysmal nocturnal dyspnea, shortness of breath Dermatological: negative for rash Respiratory: negative for cough or wheezing Urologic: negative for hematuria Abdominal: negative for nausea, vomiting, diarrhea, bright red blood per rectum, melena, or hematemesis Neurologic: negative for visual changes, syncope, or dizziness All other systems reviewed and are otherwise negative except as noted above.  Labs:   Lab Results  Component Value Date   WBC 8.1 11/03/2013   HGB 13.9 11/03/2013   HCT 41.0 11/03/2013   MCV 88.2 11/03/2013   PLT 190 11/03/2013    Recent Labs Lab 11/03/13 2000  NA 139  K 3.8  CL 100  BUN 17  CREATININE 1.60*  GLUCOSE 163*   No results found for this basename: CKTOTAL, CKMB, TROPONINI,  in the last 72 hours Lab Results  Component Value Date   CHOL 165 08/21/2013   HDL 35.40* 08/21/2013   LDLCALC 96 08/21/2013   TRIG 167.0* 08/21/2013   No results found for this basename: DDIMER    Radiology/Studies:  Dg Chest Portable 1 View  11/03/2013   CLINICAL DATA:  Chest pain. Shortness of breath.  EXAM: PORTABLE CHEST - 1 VIEW  COMPARISON:   02/17/2013  FINDINGS: Mild central airway thickening. Cardiac and mediastinal margins appear normal. No airspace opacity noted.  Thoracic spondylosis is present.  No pleural effusion observed.  IMPRESSION: 1. Airway thickening is present, suggesting bronchitis or reactive airways disease.   Electronically Signed   By: Sherryl Barters M.D.   On: 11/03/2013 20:32     EKG: atrial flutter with 2:1 AV conduction, RVR   Physical Exam: Blood pressure 104/61, pulse 85, temperature 98.3 F (36.8 C), temperature source Oral, resp. rate 15, SpO2 99.00%. General: Well developed, well nourished, in no  acute distress. Head: Normocephalic, atraumatic, sclera non-icteric, no xanthomas, nares are without discharge.  Neck: Negative for carotid bruits. JVD not elevated. Lungs: Clear bilaterally to auscultation without wheezes, rales, or rhonchi. Breathing is unlabored. Heart: irregular, tachycardic, distant S1 S2. No murmurs, rubs, or gallops appreciated. Abdomen: Soft, non-tender, non-distended with normoactive bowel sounds. No hepatomegaly. No rebound/guarding. No obvious abdominal masses. Msk:  Strength and tone appear normal for age. Extremities: No clubbing or cyanosis. No edema.  Distal pedal pulses are 2+ and equal bilaterally. Neuro: Alert and oriented X 3. No focal deficit. No facial asymmetry. Moves all extremities spontaneously. Psych:  Responds to questions appropriately with a normal affect.    ASSESSMENT AND PLAN:  73 y/o male with history of CAD s/p multiple stents, HTN, HLD and DM (A1C 6.5) who presents with cc of chest pain and shortness of breath and was found to be in atrial flutter with a rapid ventricular response with mild troponin elevation which is nonspecific representing cardiac myonecrosis and is most consistent with demand ischemia and not ACS.  1. Atrial flutter: Pt ECG reveals atrial flutter with RVR in the setting of symptoms of SOB and chest pain likely secondary to tachycardia.   Pt CHADSVasc = 4 placing him at high risk for stroke and would require anticoagulation for cardioembolic protection and would likely be a good candidate for one of the newer anticoagulants (xarelto, eliquis), however, in the setting increasing creatinine will not initiate this overnight.  --admit to telemetry unit  --increase home metoprolol to 25mg  Q6H  --continue diltiazem drip initated in the ER  --heparin drip per protocol until creatinine stablizes  --consider TEE followed by DCCV if not in NSR in AM  --possible outpatient cardiac ablation  --if creatinine stable overnight, would transition to oral anticoagulant  2. Chest pain: Pt chest pain is different from prior CAD events, POC trop negative and repeat troponin 0.35 most consistent with demand ischemia and not ACS.  In addition, recent MPI study revealed large inferior fixed defect without reversible ischemia (10/14).  --will repeat troponin, currently chest pain free  --continue ASA, plavix, metoprolol, statin  3. HTN: BP currently well controlled, continue home medications  4. PPX: therapeutic heparin drip overnight  Signed, Iwalani Templeton PA-C 11/03/2013, 10:17 PM

## 2013-11-03 NOTE — ED Provider Notes (Signed)
Patient complained of rapid heartbeat and anterior chest pain onset 6 PM today. He is presently asymptomatic since treatment with Cardizem intravenous bolus and intravenous drip in the emergency department. At 8:30 PM he is asymptomatic lungs clear auscultation heart tachycardic irregularly irregular abdomen nondistended nontender.  Orlie Dakin, MD 11/03/13 2030

## 2013-11-03 NOTE — ED Notes (Addendum)
  Diltiazem rate increased to 15mg /hr.

## 2013-11-03 NOTE — ED Notes (Signed)
Per EDP diltiazem will be increased to 15 mg along with oral metoprolol. MD aware of BP 123/65 at 2226.

## 2013-11-03 NOTE — ED Notes (Signed)
Cardiology at bedside.

## 2013-11-03 NOTE — Telephone Encounter (Signed)
New Message  Pt called having problems with  breathing.. SOB// Not at this moment// Blood pressure is up// Requests a same day appt// Please assist.

## 2013-11-03 NOTE — Progress Notes (Signed)
ANTICOAGULATION CONSULT NOTE - Initial Consult  Pharmacy Consult for heparin Indication: atrial fibrillation  Allergies  Allergen Reactions  . Brilinta [Ticagrelor]     Arthralgias & myalgias  . Crestor [Rosuvastatin]     Myalgias  . Lipitor [Atorvastatin]     Myalgias    Patient Measurements: Height: 6' (182.9 cm) Weight: 190 lb 14.4 oz (86.592 kg) IBW/kg (Calculated) : 77.6  Vital Signs: Temp: 98.2 F (36.8 C) (01/02 2332) Temp src: Oral (01/02 2332) BP: 108/76 mmHg (01/02 2332) Pulse Rate: 142 (01/02 2332)  Labs:  Recent Labs  11/03/13 1948 11/03/13 2000  HGB 13.7 13.9  HCT 39.7 41.0  PLT 190  --   CREATININE  --  1.60*    Estimated Creatinine Clearance: 45.1 ml/min (by C-G formula based on Cr of 1.6).   Medical History: Past Medical History  Diagnosis Date  . CAD (coronary artery disease), native coronary artery     a. s/p cath May 2011 with placement of DES distal RCA;  b. 01/2013 NSTEMI/Cath/PCI: LM 20d, LAD 30ost, D1 70, LCX 64m, OM1 99p (2.75x12 Promus Premier DES), 82m, 50d in branches, RCA Ca2+ prox, 40p, patent stents mid/dist, RPL 50 small, PDA 60/80, EF 45% inf HK.  [name] Kitchen Hypertension   . Hyperlipidemia   . Diabetes mellitus     AODM  . Anemia, unspecified   . Colorectal polyps 2002  . Erectile dysfunction   . Diastolic dysfunction     a. 01/2013 Echo: EF 55%, mid-dist inflat HK, Gr1 DD, Triv AI/TR, Mild MR.    Medications:  Prescriptions prior to admission  Medication Sig Dispense Refill  . aspirin 81 MG tablet Take 81 mg by mouth daily.      . calcium carbonate (OS-CAL) 600 MG TABS tablet Take 600 mg by mouth 2 (two) times daily with a meal.      . cholecalciferol (VITAMIN D) 1000 UNITS tablet Take 1,000 Units by mouth daily.      . clopidogrel (PLAVIX) 75 MG tablet Take 1 tablet (75 mg total) by mouth daily.  90 tablet  3  . fexofenadine (ALLEGRA) 180 MG tablet Take 180 mg by mouth daily.        . metFORMIN (GLUCOPHAGE) 500 MG tablet Take  500 mg by mouth 2 (two) times daily with a meal.      . metoprolol tartrate (LOPRESSOR) 25 MG tablet Take 1 tablet (25 mg total) by mouth 2 (two) times daily.  180 tablet  3  . Multiple Vitamin (MULTIVITAMIN) tablet Take 1 tablet by mouth daily.        . nitroGLYCERIN (NITROSTAT) 0.4 MG SL tablet Place 1 tablet (0.4 mg total) under the tongue every 5 (five) minutes as needed for chest pain.  25 tablet  6  . simvastatin (ZOCOR) 40 MG tablet Take 1 tablet (40 mg total) by mouth at bedtime.  90 tablet  3  . vitamin E 100 UNIT capsule Take 100 Units by mouth daily.       Scheduled:  . [START ON 11/04/2013] aspirin  81 mg Oral Daily  . [START ON 11/04/2013] clopidogrel  75 mg Oral Daily  . [START ON 11/04/2013] insulin aspart  0-15 Units Subcutaneous TID WC  . metoprolol tartrate  25 mg Oral Q6H  . simvastatin  40 mg Oral QHS   Infusions:  . diltiazem (CARDIZEM) infusion 10 mg/hr (11/03/13 2053)    Assessment: 73yo male had c/o SOB x1-3wk w/ exertion to cardiologist and had made appt  to be seen but then had sudden onset of CP, has h/o MIs, NTG w/ mild improvement in pain, found in ED to be in new-onset Afib w/ RVR, CP resolved w/ start of diltiazem, now to start heparin.  Goal of Therapy:  Heparin level 0.3-0.7 units/ml Monitor platelets by anticoagulation protocol: Yes   Plan:  Will give heparin 4000 units IV bolus x1 followed by gtt at 1250 units/hr and monitor heparin levels and CBC; f/u long-term anticoag plans (CHADS2=4).  Wynona Neat, PharmD, BCPS  11/03/2013,11:38 PM

## 2013-11-03 NOTE — ED Provider Notes (Signed)
CSN: [id number]     Arrival date & time 11/03/13  1924 History   First MD Initiated Contact with Patient 11/03/13 1939     Chief Complaint  Patient presents with  . Chest Pain  . Shortness of Breath  . Tachycardia   HPI  Gregory Bates is a 73 y/o male with history of CAD, HTN, HLD and DM who presents with cc of chest pain and shortness of breath. He states he has been short of breath for the last three days. He has had DOE. This evening at approximately 1800 he developed mid sternal chest "pressure". He took 324 mg ASA. He received SL nitro in route with EMS with only mild improvement in his pain. He denies any fevers or coughs.   Past Medical History  Diagnosis Date  . CAD (coronary artery disease), native coronary artery     a. s/p cath May 2011 with placement of DES distal RCA;  b. 01/2013 NSTEMI/Cath/PCI: LM 20d, LAD 30ost, D1 70, LCX 64m, OM1 99p (2.75x12 Promus Premier DES), 60m, 50d in branches, RCA Ca2+ prox, 40p, patent stents mid/dist, RPL 50 small, PDA 60/80, EF 45% inf HK.  [name] Kitchen Hypertension   . Hyperlipidemia   . Diabetes mellitus     AODM  . Anemia, unspecified   . Colorectal polyps 2002  . Erectile dysfunction   . Diastolic dysfunction     a. 01/2013 Echo: EF 55%, mid-dist inflat HK, Gr1 DD, Triv AI/TR, Mild MR.   Past Surgical History  Procedure Laterality Date  . Wisdom tooth extraction    . Colonoscopy w/ polypectomy  2002    [facility]  . Infantile paralysis facial asymmetry    . Coronary stent placement  2007, 2011  . Colonoscopy w/ polypectomy  2004; 2007 negative   Family History  Problem Relation Age of Onset  . Heart attack Brother 54  . Colon cancer Brother   . Diabetes Neg Hx   . Stroke Neg Hx   . Hypertension Neg Hx    History  Substance Use Topics  . Smoking status: Former Smoker    Quit date: 11/02/1984  . Smokeless tobacco: Never Used     Comment: smoked Miner, up to 3 ppd (!)  . Alcohol Use: No   Review of Systems  Constitutional: Negative  for chills and fatigue.  Respiratory: Positive for shortness of breath. Negative for cough.   Cardiovascular: Positive for chest pain and palpitations. Negative for leg swelling.  Gastrointestinal: Negative for nausea, vomiting, abdominal pain, diarrhea and constipation.  Genitourinary: Negative for dysuria and frequency.  All other systems reviewed and are negative.   Allergies  Brilinta; Crestor; and Lipitor  Home Medications   Current Outpatient Rx  Name  Route  Sig  Dispense  Refill  . aspirin 81 MG tablet   Oral   Take 81 mg by mouth daily.         . calcium carbonate (OS-CAL) 600 MG TABS tablet   Oral   Take 600 mg by mouth 2 (two) times daily with a meal.         . cholecalciferol (VITAMIN D) 1000 UNITS tablet   Oral   Take 1,000 Units by mouth daily.         . clopidogrel (PLAVIX) 75 MG tablet   Oral   Take 1 tablet (75 mg total) by mouth daily.   90 tablet   3   . fexofenadine (ALLEGRA) 180 MG tablet   Oral  Take 180 mg by mouth daily.           . metFORMIN (GLUCOPHAGE) 500 MG tablet   Oral   Take 500 mg by mouth 2 (two) times daily with a meal.         . metoprolol tartrate (LOPRESSOR) 25 MG tablet   Oral   Take 1 tablet (25 mg total) by mouth 2 (two) times daily.   180 tablet   3   . Multiple Vitamin (MULTIVITAMIN) tablet   Oral   Take 1 tablet by mouth daily.           . nitroGLYCERIN (NITROSTAT) 0.4 MG SL tablet   Sublingual   Place 1 tablet (0.4 mg total) under the tongue every 5 (five) minutes as needed for chest pain.   25 tablet   6   . simvastatin (ZOCOR) 40 MG tablet   Oral   Take 1 tablet (40 mg total) by mouth at bedtime.   90 tablet   3   . vitamin E 100 UNIT capsule   Oral   Take 100 Units by mouth daily.          BP 133/87  Pulse 145  Temp(Src) 98.3 F (36.8 C) (Oral)  Resp 13  SpO2 98% Physical Exam  Constitutional: He is oriented to person, place, and time. He appears well-developed and well-nourished.  No distress.  HENT:  Head: Normocephalic and atraumatic.  Mouth/Throat: No oropharyngeal exudate.  Eyes: Conjunctivae are normal. Pupils are equal, round, and reactive to light.  Neck: Normal range of motion. Neck supple.  Cardiovascular: Normal heart sounds.  An irregularly irregular rhythm present. Tachycardia present.  Exam reveals no gallop and no friction rub.   No murmur heard. Pulmonary/Chest: Effort normal and breath sounds normal.  Abdominal: Soft. He exhibits no distension. There is no tenderness.  Musculoskeletal: Normal range of motion. He exhibits no edema and no tenderness.  Neurological: He is alert and oriented to person, place, and time. He has normal strength and normal reflexes. No cranial nerve deficit or sensory deficit. Coordination normal. GCS eye subscore is 4. GCS verbal subscore is 5. GCS motor subscore is 6.  Skin: Skin is warm and dry.    ED Course  Procedures (including critical care time) Labs Review Labs Reviewed  PRO B NATRIURETIC PEPTIDE - Abnormal; Notable for the following:    Pro B Natriuretic peptide (BNP) 418.8 (*)    All other components within normal limits  POCT I-STAT, CHEM 8 - Abnormal; Notable for the following:    Creatinine, Ser 1.60 (*)    Glucose, Bld 163 (*)    All other components within normal limits  CBC WITH DIFFERENTIAL  POCT I-STAT TROPONIN I   Imaging Review Dg Chest Portable 1 View  11/03/2013   CLINICAL DATA:  Chest pain. Shortness of breath.  EXAM: PORTABLE CHEST - 1 VIEW  COMPARISON:  02/17/2013  FINDINGS: Mild central airway thickening. Cardiac and mediastinal margins appear normal. No airspace opacity noted.  Thoracic spondylosis is present.  No pleural effusion observed.  IMPRESSION: 1. Airway thickening is present, suggesting bronchitis or reactive airways disease.   Electronically Signed   By: Sherryl Barters M.D.   On: 11/03/2013 20:32    EKG Interpretation    Date/Time:  Friday November 03 2013 19:33:54  EST Ventricular Rate:  153 PR Interval:    QRS Duration: 97 QT Interval:  298 QTC Calculation: 475 R Axis:   49 Text Interpretation:  Atrial  fibrillation with rapid V-rate Repolarization abnormality, prob rate related Compared to previous tracing Atrial fibrillation , new Confirmed by Winfred Leeds  MD, SAM ([id number]) on 11/03/2013 7:52:48 PM            MDM  1. Atrial Fibrillation with Rapid Ventricular Response 2. Chest Pain  Here with CP, Palpitations and SOB. Afebrile. No infectious symptoms. No risk factors for PE. No pleuritic component. Pain is reported as similar to previous MI. Initial HR in 150's. EKG c/w a-fib with RVR. Dilt bolus and gtt given and started. Chest pain resolved after starting diltiazem and he remained pain free throughout the rest of his ED stay. CXR not c/w pneumonia. BNP only mildly elevated but doubt CHF exacerbation without pulmonary edema on CXR or physical exam findings c/w volume overload. Initial troponin negative. Cardiology consulted. The patient had persistent tachycardia despite diltiazem gtt at 10mg /hr. Cardiology recommended increasing rate to 15 mg/hr and starting metoprolol at 25 mg oral. The patient was admitted to cardiology for continued workup and management.    Donita Brooks, MD 11/03/13 2328

## 2013-11-03 NOTE — ED Notes (Signed)
MD made aware of current vital signs and increase in diltiazem.

## 2013-11-04 DIAGNOSIS — I4891 Unspecified atrial fibrillation: Principal | ICD-10-CM

## 2013-11-04 LAB — LIPID PANEL
CHOLESTEROL: 170 mg/dL (ref 0–200)
HDL: 34 mg/dL — ABNORMAL LOW (ref >39)
LDL Cholesterol: 103 mg/dL — ABNORMAL HIGH (ref 0–99)
Total CHOL/HDL Ratio: 5 RATIO
Triglycerides: 166 mg/dL — ABNORMAL HIGH (ref <150)
VLDL: 33 mg/dL (ref 0–40)

## 2013-11-04 LAB — HEPARIN LEVEL (UNFRACTIONATED): Heparin Unfractionated: 0.54 IU/mL (ref 0.30–0.70)

## 2013-11-04 LAB — BASIC METABOLIC PANEL
BUN: 20 mg/dL (ref 6–23)
CO2: 28 mEq/L (ref 19–32)
CREATININE: 1.41 mg/dL — AB (ref 0.50–1.35)
Calcium: 9.4 mg/dL (ref 8.4–10.5)
Chloride: 101 mEq/L (ref 96–112)
GFR calc Af Amer: 56 mL/min — ABNORMAL LOW (ref >90)
GFR, EST NON AFRICAN AMERICAN: 48 mL/min — AB (ref >90)
GLUCOSE: 132 mg/dL — AB (ref 70–99)
POTASSIUM: 4.9 meq/L (ref 3.7–5.3)
Sodium: 142 mEq/L (ref 137–147)

## 2013-11-04 LAB — CBC
HEMATOCRIT: 37.1 % — AB (ref 39.0–52.0)
HEMOGLOBIN: 12.5 g/dL — AB (ref 13.0–17.0)
MCH: 30.1 pg (ref 26.0–34.0)
MCHC: 33.7 g/dL (ref 30.0–36.0)
MCV: 89.4 fL (ref 78.0–100.0)
Platelets: 172 10*3/uL (ref 150–400)
RBC: 4.15 MIL/uL — ABNORMAL LOW (ref 4.22–5.81)
RDW: 12.9 % (ref 11.5–15.5)
WBC: 9.7 10*3/uL (ref 4.0–10.5)

## 2013-11-04 LAB — MAGNESIUM: MAGNESIUM: 2 mg/dL (ref 1.5–2.5)

## 2013-11-04 LAB — TSH: TSH: 1.048 u[IU]/mL (ref 0.350–4.500)

## 2013-11-04 LAB — TROPONIN I
Troponin I: 0.35 ng/mL (ref <0.30)
Troponin I: 0.73 ng/mL (ref <0.30)

## 2013-11-04 LAB — HEMOGLOBIN A1C
Hgb A1c MFr Bld: 6.7 % — ABNORMAL HIGH (ref <5.7)
MEAN PLASMA GLUCOSE: 146 mg/dL — AB (ref <117)

## 2013-11-04 LAB — GLUCOSE, CAPILLARY
GLUCOSE-CAPILLARY: 134 mg/dL — AB (ref 70–99)
GLUCOSE-CAPILLARY: 188 mg/dL — AB (ref 70–99)
Glucose-Capillary: 99 mg/dL (ref 70–99)
Glucose-Capillary: 131 mg/dL — ABNORMAL HIGH (ref 70–99)

## 2013-11-04 LAB — PROTIME-INR
INR: 1.02 (ref 0.00–1.49)
Prothrombin Time: 13.2 seconds (ref 11.6–15.2)

## 2013-11-04 MED ORDER — APIXABAN 5 MG PO TABS
5.0000 mg | ORAL_TABLET | Freq: Two times a day (BID) | ORAL | Status: DC
  Administered 2013-11-04 – 2013-11-05 (×3): 5 mg via ORAL
  Filled 2013-11-04 (×4): qty 1

## 2013-11-04 NOTE — Progress Notes (Signed)
Utilization Review completed.  

## 2013-11-04 NOTE — Progress Notes (Signed)
SUBJECTIVE:  No chest pain.  No SOB.     PHYSICAL EXAM Filed Vitals:   11/03/13 2250 11/03/13 2300 11/03/13 2332 11/04/13 0443  BP: 124/88 133/87 108/76 111/60  Pulse: 141 145 142 67  Temp:   98.2 F (36.8 C) 98.2 F (36.8 C)  TempSrc:   Oral Oral  Resp: 17 13 18 18   Height:   6' (1.829 m)   Weight:   190 lb 14.4 oz (86.592 kg) 190 lb 14.4 oz (86.592 kg)  SpO2: 95% 98% 97% 98%   General:  No distress Lungs:  Clear Heart:  RRR Abdomen:  Positive bowel sounds, no rebound no guarding Extremities:  No edema Neuro:  Nonfocal  LABS: Lab Results  Component Value Date   TROPONINI 0.73* 11/04/2013   Results for orders placed during the hospital encounter of 11/03/13 (from the past 24 hour(s))  CBC WITH DIFFERENTIAL     Status: None   Collection Time    11/03/13  7:48 PM      Result Value Range   WBC 8.1  4.0 - 10.5 K/uL   RBC 4.50  4.22 - 5.81 MIL/uL   Hemoglobin 13.7  13.0 - 17.0 g/dL   HCT 39.7  39.0 - 52.0 %   MCV 88.2  78.0 - 100.0 fL   MCH 30.4  26.0 - 34.0 pg   MCHC 34.5  30.0 - 36.0 g/dL   RDW 12.6  11.5 - 15.5 %   Platelets 190  150 - 400 K/uL   Neutrophils Relative % 63  43 - 77 %   Neutro Abs 5.1  1.7 - 7.7 K/uL   Lymphocytes Relative 22  12 - 46 %   Lymphs Abs 1.7  0.7 - 4.0 K/uL   Monocytes Relative 10  3 - 12 %   Monocytes Absolute 0.8  0.1 - 1.0 K/uL   Eosinophils Relative 5  0 - 5 %   Eosinophils Absolute 0.4  0.0 - 0.7 K/uL   Basophils Relative 1  0 - 1 %   Basophils Absolute 0.0  0.0 - 0.1 K/uL  PRO B NATRIURETIC PEPTIDE     Status: Abnormal   Collection Time    11/03/13  7:49 PM      Result Value Range   Pro B Natriuretic peptide (BNP) 418.8 (*) 0 - 125 pg/mL  POCT I-STAT TROPONIN I     Status: None   Collection Time    11/03/13  7:57 PM      Result Value Range   Troponin i, poc 0.02  0.00 - 0.08 ng/mL   Comment 3           POCT I-STAT, CHEM 8     Status: Abnormal   Collection Time    11/03/13  8:00 PM      Result Value Range   Sodium 139   137 - 147 mEq/L   Potassium 3.8  3.7 - 5.3 mEq/L   Chloride 100  96 - 112 mEq/L   BUN 17  6 - 23 mg/dL   Creatinine, Ser 1.60 (*) 0.50 - 1.35 mg/dL   Glucose, Bld 163 (*) 70 - 99 mg/dL   Calcium, Ion 1.18  1.13 - 1.30 mmol/L   TCO2 27  0 - 100 mmol/L   Hemoglobin 13.9  13.0 - 17.0 g/dL   HCT 41.0  39.0 - 52.0 %  GLUCOSE, CAPILLARY     Status: Abnormal   Collection Time    11/03/13  11:40 PM      Result Value Range   Glucose-Capillary 152 (*) 70 - 99 mg/dL   Comment 1 Notify RN    MAGNESIUM     Status: None   Collection Time    11/04/13  6:17 AM      Result Value Range   Magnesium 2.0  1.5 - 2.5 mg/dL  LIPID PANEL     Status: Abnormal   Collection Time    11/04/13  6:17 AM      Result Value Range   Cholesterol 170  0 - 200 mg/dL   Triglycerides 166 (*) <150 mg/dL   HDL 34 (*) >39 mg/dL   Total CHOL/HDL Ratio 5.0     VLDL 33  0 - 40 mg/dL   LDL Cholesterol 103 (*) 0 - 99 mg/dL  CBC     Status: Abnormal   Collection Time    11/04/13  6:17 AM      Result Value Range   WBC 9.7  4.0 - 10.5 K/uL   RBC 4.15 (*) 4.22 - 5.81 MIL/uL   Hemoglobin 12.5 (*) 13.0 - 17.0 g/dL   HCT 37.1 (*) 39.0 - 52.0 %   MCV 89.4  78.0 - 100.0 fL   MCH 30.1  26.0 - 34.0 pg   MCHC 33.7  30.0 - 36.0 g/dL   RDW 12.9  11.5 - 15.5 %   Platelets 172  150 - 400 K/uL  BASIC METABOLIC PANEL     Status: Abnormal   Collection Time    11/04/13  6:17 AM      Result Value Range   Sodium 142  137 - 147 mEq/L   Potassium 4.9  3.7 - 5.3 mEq/L   Chloride 101  96 - 112 mEq/L   CO2 28  19 - 32 mEq/L   Glucose, Bld 132 (*) 70 - 99 mg/dL   BUN 20  6 - 23 mg/dL   Creatinine, Ser 1.41 (*) 0.50 - 1.35 mg/dL   Calcium 9.4  8.4 - 10.5 mg/dL   GFR calc non Af Amer 48 (*) >90 mL/min   GFR calc Af Amer 56 (*) >90 mL/min  TROPONIN I     Status: Abnormal   Collection Time    11/04/13  6:17 AM      Result Value Range   Troponin I 0.73 (*) <0.30 ng/mL  GLUCOSE, CAPILLARY     Status: Abnormal   Collection Time     11/04/13  7:29 AM      Result Value Range   Glucose-Capillary 134 (*) 70 - 99 mg/dL  PROTIME-INR     Status: None   Collection Time    11/04/13  8:00 AM      Result Value Range   Prothrombin Time 13.2  11.6 - 15.2 seconds   INR 1.02  0.00 - 1.49  HEPARIN LEVEL (UNFRACTIONATED)     Status: None   Collection Time    11/04/13  8:00 AM      Result Value Range   Heparin Unfractionated 0.54  0.30 - 0.70 IU/mL    Intake/Output Summary (Last 24 hours) at 11/04/13 1014 Last data filed at 11/04/13 0224  Gross per 24 hour  Intake      0 ml  Output    325 ml  Net   -325 ml    EKG:  NSR, rate 65, no acute ST T wave changes.     ASSESSMENT AND PLAN:  ATRIAL FLUTTER:  Gregory Bates has a CHA2DS2 - VASc score of 5 with a risk of stroke of 6.7%  and a HAS - BLED score of 2 with a low risk of bleeding.  Now in NSR.  Cardizem stopped.  Start Eliquis.  I think that it is most prudent to use Plavix plus NOAC.  Stop ASA on follow up.  He has an appt with Dr. Angelena Form on Monday and I will have him keep this appt to discuss further and consider stopping the ASA.   CAD:  Troponin slightly elevated.   However, this was probably secondary to atrial flutter.   I will trend the troponin and check an echocardiogram to see if there is a lower EF than previous.     [name] Inocente Krach 11/04/2013 10:14 AM

## 2013-11-04 NOTE — Plan of Care (Signed)
Problem: Consults Goal: Atrial Arhythmia Patient Education (See Patient Education module for education specifics.) Outcome: Progressing Pt given off the beat book. Pt educated on rhythm changes & the medications he's currently on  Problem: Phase I Progression Outcomes Goal: Ventricular heart rate < 120/min Outcome: Completed/Met Date Met:  11/04/13 Pt currently in NSR hr in the 60s.  Goal: Anticoagulation Therapy per MD order Outcome: Not Progressing Pt currently on a Heparin drip Goal: Heart rate or rhythm control medication Outcome: Progressing Pt was on IV cardizem. Cardizem drip d/c'd around 0100. Pt on metoprolol 25 mg q 6 hrs. Goal: Pain controlled with appropriate interventions Outcome: Completed/Met Date Met:  11/04/13 Pt is CP free.

## 2013-11-04 NOTE — ED Provider Notes (Signed)
I have personally seen and examined the patient.  I have discussed the plan of care with the resident.  I have reviewed the documentation on PMH/FH/Soc. History.  I have reviewed the documentation of the resident and agree.  Orlie Dakin, MD 11/04/13 [phone]

## 2013-11-04 NOTE — Progress Notes (Signed)
ANTICOAGULATION CONSULT NOTE - Follow Up Consult  Pharmacy Consult for Heparin -> Eliquis Indication: atrial fibrillation  Allergies  Allergen Reactions  . Brilinta [Ticagrelor]     Arthralgias & myalgias  . Crestor [Rosuvastatin]     Myalgias (takes Zocor)  . Lipitor [Atorvastatin]     Myalgias (takes Zocor)    Patient Measurements: Height: 6' (182.9 cm) Weight: 190 lb 14.4 oz (86.592 kg) IBW/kg (Calculated) : 77.6  Vital Signs: Temp: 98.2 F (36.8 C) (01/03 0443) Temp src: Oral (01/03 0443) BP: 111/60 mmHg (01/03 0443) Pulse Rate: 67 (01/03 0443)  Labs:  Recent Labs  11/03/13 0015  11/03/13 1948 11/03/13 2000 11/04/13 0617 11/04/13 0800  HGB  --   < > 13.7 13.9 12.5*  --   HCT  --   --  39.7 41.0 37.1*  --   PLT  --   --  190  --  172  --   LABPROT  --   --   --   --   --  13.2  INR  --   --   --   --   --  1.02  HEPARINUNFRC  --   --   --   --   --  0.54  CREATININE  --   --   --  1.60* 1.41*  --   TROPONINI 0.35*  --   --   --  0.73*  --   < > = values in this interval not displayed.  Estimated Creatinine Clearance: 51.2 ml/min (by C-G formula based on Cr of 1.41).  Assessment:   Heparin level therapeutic (0.54) on 1250 units/hr this morning.  Drip just stopped. To begin Eliquis. Full-dose Eliquis.  Noted to continue Plavix, but may consider stopping ASA 81 mg with outpatient appointment on 11/06/12.  Goal of Therapy:  full anticoagulation Monitor platelets by anticoagulation protocol: Yes   Plan:   Begin Eliquis 5 mg BID.  Delivered first dose.  Heparin and heparin levels discontinued.  Arty Baumgartner, [place] Pager: [phone] 11/04/2013,10:58 AM

## 2013-11-04 NOTE — Progress Notes (Signed)
CRITICAL VALUE ALERT  Critical value received:  *Troponin 0.35  Date of notification:  11/04/2013  Time of notification:  0123  Critical value read back:yes  Nurse who received alert:  Harrell Gave Ralph/ Nevin Bloodgood  MD notified (1st page):  Burman Nieves, MD  Time of first page:  0123  MD notified (2nd page):  Time of second page:  Responding MD:  Burman Nieves, MD  Time MD responded:  [id number]  No new orders given. Pt asymptomatic and already on heparin drip. Will continue to monitor the pt. Hoover Brunette, RN

## 2013-11-04 NOTE — Progress Notes (Signed)
MD notified of pt's rhythm change. Pt converted to NSR around 0050. EKG done & in the chart.  New orders to stop the cardizem drip. Cardizem drip d/c'd at 0100. Cardizem d/c unable to be documented in epic d/t order being locked by previous RN. However, the drip was stopped at 0100. Will continue to monitor the pt. Hoover Brunette, RN

## 2013-11-05 ENCOUNTER — Encounter (HOSPITAL_COMMUNITY): Payer: Self-pay | Admitting: Physician Assistant

## 2013-11-05 DIAGNOSIS — I359 Nonrheumatic aortic valve disorder, unspecified: Secondary | ICD-10-CM

## 2013-11-05 LAB — BASIC METABOLIC PANEL
BUN: 17 mg/dL (ref 6–23)
CHLORIDE: 104 meq/L (ref 96–112)
CO2: 28 mEq/L (ref 19–32)
Calcium: 9.4 mg/dL (ref 8.4–10.5)
Creatinine, Ser: 1.29 mg/dL (ref 0.50–1.35)
GFR calc Af Amer: 62 mL/min — ABNORMAL LOW (ref >90)
GFR calc non Af Amer: 53 mL/min — ABNORMAL LOW (ref >90)
Glucose, Bld: 134 mg/dL — ABNORMAL HIGH (ref 70–99)
POTASSIUM: 4.3 meq/L (ref 3.7–5.3)
Sodium: 143 mEq/L (ref 137–147)

## 2013-11-05 LAB — CBC
HEMATOCRIT: 40 % (ref 39.0–52.0)
Hemoglobin: 13.4 g/dL (ref 13.0–17.0)
MCH: 30.2 pg (ref 26.0–34.0)
MCHC: 33.5 g/dL (ref 30.0–36.0)
MCV: 90.1 fL (ref 78.0–100.0)
PLATELETS: 170 10*3/uL (ref 150–400)
RBC: 4.44 MIL/uL (ref 4.22–5.81)
RDW: 12.7 % (ref 11.5–15.5)
WBC: 8.7 10*3/uL (ref 4.0–10.5)

## 2013-11-05 LAB — TROPONIN I: TROPONIN I: 0.39 ng/mL — AB (ref <0.30)

## 2013-11-05 LAB — GLUCOSE, CAPILLARY: GLUCOSE-CAPILLARY: 137 mg/dL — AB (ref 70–99)

## 2013-11-05 MED ORDER — APIXABAN 5 MG PO TABS: 5.0000 mg | ORAL_TABLET | Freq: Two times a day (BID) | ORAL | Status: DC

## 2013-11-05 MED ORDER — METOPROLOL TARTRATE 50 MG PO TABS
50.0000 mg | ORAL_TABLET | Freq: Two times a day (BID) | ORAL | Status: DC
  Filled 2013-11-05: qty 1

## 2013-11-05 MED ORDER — METOPROLOL TARTRATE 50 MG PO TABS: 50.0000 mg | ORAL_TABLET | Freq: Two times a day (BID) | ORAL | Status: DC

## 2013-11-05 NOTE — Progress Notes (Signed)
Echocardiogram 2D Echocardiogram has been performed.  Joelene Millin 11/05/2013, 10:20 AM

## 2013-11-05 NOTE — Discharge Summary (Signed)
Discharge Summary   Patient ID: Gregory Bates MRN: [id number], DOB/AGE: Jul 29, 1941 73 y.o. Admit date: 11/03/2013 D/C date:     11/05/2013  Primary Care Provider: [name], MD Primary Cardiologist: Angelena Form  Please note the patient left AMA on day of discharge while awaiting Care Manager consult to make sure he could afford Eliquis and to make sure no prior authorization was necessary. He did not receive any of his discharge instructions or paperwork. He has a followup appointment with Dr. Angelena Form tomorrow already scheduled. Prescriptions were electronically prescribed so should be waiting for patient at his pharmacy.  Primary Discharge Diagnoses:  1. Atrial flutter, newly recognized - spont converted to NSR - placed on Eliquis 2. CAD - mild troponin elevation this admission felt due to #1 - history: a. s/p cath May 2011 with placement of DES distal RCA; b. 01/2013 NSTEMI/Cath/PCI: LM 20d, LAD 30ost, D1 70, LCX 22m, OM1 99p (2.75x12 Promus Premier DES), 39m, 50d in branches, RCA Ca2+ prox, 40p, patent stents mid/dist, RPL 50 small, PDA 60/80, EF 45% inf HK.  3. Acute renal insufficiency  Secondary Discharge Diagnoses:  1. Hypertension   2. Hyperlipidemia   3. Diabetes mellitus AODM  4. Anemia, unspecified  5. Colorectal polyps 2002   6. Erectile dysfunction   7. Diastolic dysfunction  Hospital Course: [name] is a 73 y/o M with CAD s/p multiple stents (last in 01/2013), HTN, HLD and DM (A1C 6.5) who presented to [facility] 11/03/2013 with complaints of CP or SOB. For the past 7 days he's had progressive SOB and exertion. He experienced an episode of chest pain the night of presentation described as central and left sided, 5/10, dull/pressure, no radiation, associated with shortness of breath, no diaphoresis/nausea/vomiting. The pain lasted until arrival in the ER and subsided after receiving diltiazem for his rapid heart rate. He was tachycardic and found to be in atrial  flutter with RVR. Diltiazem drip was continued inpatient and metoprolol was increased. He was placed on heparin per pharmacy given [place] = 5. He spontaneously converted to NSR. Cardizem was discontinued and he was maintained on the higher dose of Lopressor. Troponin was mildly elevated up to peak of 0.73 which was felt consistent with demand ischemia rather than ACS.   He was placed on Eliquis for anticoagulation. Dr. Percival Spanish felt it was most prudent to use Plavix plus NOAC, with consideration to stop aspirin in followup. The patient actually has an appointment tomorrow with Dr. Angelena Form that Dr. Percival Spanish would like him to keep (should discuss stopping ASA at that visit). Dr. Percival Spanish has seen and examined the patient today and feels he is stable for discharge.   The patient was awaiting read of his 2D echo and visit by care manager prior to going home but he decided to leave AMA before either of these two were completed. The care manager needed to see him to make sure he could afford Eliquis and determine if any prior authorization was necessary. Therefore he did not get any of his paperwork which included instructions for new dose of metoprolol (50mg  BID), Eliquis dosing, and instructions to contact PCP regarding Metformin. His Cr was 1.6 on admission which improved as HR improved - discharge Cr was 1.29. This may need to be monitored given that he is on Metformin. He also did not stay long enough to receive the 30-day free Eliquis prescription I had written him.  I also LM on scheduling VM regarding 4 week blood thinner clinic  education visit. The information below reflects the discharge instructions the patient was supposed to receive. 2D echo pending at this time.  Discharge Vitals: Blood pressure 143/74, pulse 71, temperature 97.9 F (36.6 C), temperature source Oral, resp. rate 18, height 6' (1.829 m), weight 183 lb 3.2 oz (83.099 kg), SpO2 97.00%.  Labs: Lab Results  Component Value Date    WBC 8.7 11/05/2013   HGB 13.4 11/05/2013   HCT 40.0 11/05/2013   MCV 90.1 11/05/2013   PLT 170 11/05/2013     Recent Labs Lab 11/05/13 0600  NA 143  K 4.3  CL 104  CO2 28  BUN 17  CREATININE 1.29  CALCIUM 9.4  GLUCOSE 134*    Recent Labs  11/03/13 0015 11/04/13 0617 11/05/13 0600  TROPONINI 0.35* 0.73* 0.39*   Lab Results  Component Value Date   CHOL 170 11/04/2013   HDL 34* 11/04/2013   LDLCALC 103* 11/04/2013   TRIG 166* 11/04/2013   No results found for this basename: DDIMER    Diagnostic Studies/Procedures   Dg Chest Portable 1 View  11/03/2013   CLINICAL DATA:  Chest pain. Shortness of breath.  EXAM: PORTABLE CHEST - 1 VIEW  COMPARISON:  02/17/2013  FINDINGS: Mild central airway thickening. Cardiac and mediastinal margins appear normal. No airspace opacity noted.  Thoracic spondylosis is present.  No pleural effusion observed.  IMPRESSION: 1. Airway thickening is present, suggesting bronchitis or reactive airways disease.   Electronically Signed   By: Sherryl Barters M.D.   On: 11/03/2013 20:32    Discharge Medications     Medication List         apixaban 5 MG Tabs tablet  Commonly known as:  ELIQUIS  Take 1 tablet (5 mg total) by mouth 2 (two) times daily.     aspirin 81 MG tablet  Take 81 mg by mouth daily.     calcium carbonate 600 MG Tabs tablet  Commonly known as:  OS-CAL  Take 600 mg by mouth 2 (two) times daily with a meal.     cholecalciferol 1000 UNITS tablet  Commonly known as:  VITAMIN D  Take 1,000 Units by mouth daily.     clopidogrel 75 MG tablet  Commonly known as:  PLAVIX  Take 1 tablet (75 mg total) by mouth daily.     fexofenadine 180 MG tablet  Commonly known as:  ALLEGRA  Take 180 mg by mouth daily.     metFORMIN 500 MG tablet  Commonly known as:  GLUCOPHAGE  Take 500 mg by mouth 2 (two) times daily with a meal.     metoprolol 50 MG tablet  Commonly known as:  LOPRESSOR  Take 1 tablet (50 mg total) by mouth 2 (two) times daily.       multivitamin tablet  Take 1 tablet by mouth daily.     nitroGLYCERIN 0.4 MG SL tablet  Commonly known as:  NITROSTAT  Place 1 tablet (0.4 mg total) under the tongue every 5 (five) minutes as needed for chest pain.     simvastatin 40 MG tablet  Commonly known as:  ZOCOR  Take 1 tablet (40 mg total) by mouth at bedtime.     vitamin E 100 UNIT capsule  Take 100 Units by mouth daily.        Disposition   The patient will be discharged in stable condition to home. Discharge Orders   Future Appointments Provider Department Dept Phone   11/06/2013 11:15 AM Burnell Blanks,  MD [facility] [phone]   Future Orders Complete By Expires   Diet - low sodium heart healthy  As directed    Discharge instructions  As directed    Comments:     Your kidney function was mildly abnormal when you came into the hospital. This may be due to your elevated heart rate. It has improved. Your kidney function may need to be monitored since you are on Metformin. Please discuss with your primary care doctor.  At tomorrow's appointment with Dr. Angelena Form, please discuss whether or not he wants you to continue your aspirin.   Increase activity slowly  As directed      Follow-up Information   Follow up with [facility], MD. (11/06/13 at 11:15am)    Specialty:  Cardiology   Contact information:   [address]  [place] [address] [phone]       Follow up with [facility]. (When patients are started on the newer-agent blood thinners, we typically have them follow up in the Blood Thinner clinic for an education visit in about 4 weeks. Office will call you for this appointment.)    Specialty:  Cardiology   Contact information:   [address] [place] [address] [phone]      Follow up with [name], MD. (See discharge instructions)    Specialty:  Internal Medicine   Contact information:   [phone]  [facility] [address] [place] [address] [phone]         Duration of Discharge Encounter: Greater than 30 minutes including physician and PA time.  Signed, Melina Copa PA-C 11/05/2013, 12:51 PM  Patient seen and examined.  Plan as discussed in my rounding note for today and outlined above. [name] [facility]  11/05/2013  4:12 PM

## 2013-11-05 NOTE — Progress Notes (Signed)
SUBJECTIVE:  No chest pain.  No SOB.     PHYSICAL EXAM Filed Vitals:   11/04/13 1836 11/04/13 2001 11/05/13 0500 11/05/13 0604  BP: 132/69 125/72  143/74  Pulse: 73 67  71  Temp:  98.8 F (37.1 C)  97.9 F (36.6 C)  TempSrc:  Oral  Oral  Resp:  18  18  Height:      Weight:   183 lb 3.2 oz (83.099 kg)   SpO2: 95% 99%  97%   General:  No distress Lungs:  Clear Heart:  RRR Abdomen:  Positive bowel sounds, no rebound no guarding Extremities:  No edema   LABS: Lab Results  Component Value Date   TROPONINI 0.39* 11/05/2013   Results for orders placed during the hospital encounter of 11/03/13 (from the past 24 hour(s))  GLUCOSE, CAPILLARY     Status: None   Collection Time    11/04/13 12:11 PM      Result Value Range   Glucose-Capillary 99  70 - 99 mg/dL  GLUCOSE, CAPILLARY     Status: Abnormal   Collection Time    11/04/13  4:50 PM      Result Value Range   Glucose-Capillary 188 (*) 70 - 99 mg/dL  GLUCOSE, CAPILLARY     Status: Abnormal   Collection Time    11/04/13  8:00 PM      Result Value Range   Glucose-Capillary 131 (*) 70 - 99 mg/dL   Comment 1 Notify RN    CBC     Status: None   Collection Time    11/05/13  6:00 AM      Result Value Range   WBC 8.7  4.0 - 10.5 K/uL   RBC 4.44  4.22 - 5.81 MIL/uL   Hemoglobin 13.4  13.0 - 17.0 g/dL   HCT 40.0  39.0 - 52.0 %   MCV 90.1  78.0 - 100.0 fL   MCH 30.2  26.0 - 34.0 pg   MCHC 33.5  30.0 - 36.0 g/dL   RDW 12.7  11.5 - 15.5 %   Platelets 170  150 - 400 K/uL  BASIC METABOLIC PANEL     Status: Abnormal   Collection Time    11/05/13  6:00 AM      Result Value Range   Sodium 143  137 - 147 mEq/L   Potassium 4.3  3.7 - 5.3 mEq/L   Chloride 104  96 - 112 mEq/L   CO2 28  19 - 32 mEq/L   Glucose, Bld 134 (*) 70 - 99 mg/dL   BUN 17  6 - 23 mg/dL   Creatinine, Ser 1.29  0.50 - 1.35 mg/dL   Calcium 9.4  8.4 - 10.5 mg/dL   GFR calc non Af Amer 53 (*) >90 mL/min   GFR calc Af Amer 62 (*) >90 mL/min  TROPONIN I      Status: Abnormal   Collection Time    11/05/13  6:00 AM      Result Value Range   Troponin I 0.39 (*) <0.30 ng/mL  GLUCOSE, CAPILLARY     Status: Abnormal   Collection Time    11/05/13  8:07 AM      Result Value Range   Glucose-Capillary 137 (*) 70 - 99 mg/dL   Comment 1 Notify RN      Intake/Output Summary (Last 24 hours) at 11/05/13 1027 Last data filed at 11/05/13 0900  Gross per 24 hour  Intake  360 ml  Output      0 ml  Net    360 ml    EKG:  NSR, rate 65, no acute ST T wave changes.     ASSESSMENT AND PLAN:  ATRIAL FLUTTER:  [name] has a CHA2DS2 - VASc score of 5 with a risk of stroke of 6.7%  and a HAS - BLED score of 2 with a low risk of bleeding.  Now in NSR.  Cardizem stopped.  Started Eliquis.  I think that it is most prudent to use Plavix plus NOAC.  Stop ASA on follow up.  He has an appt with Dr. Angelena Form on Monday and I will have him keep this appt to discuss further and consider stopping the ASA.   CAD:  Troponin slightly elevated.   However, this was probably secondary to atrial flutter.   It has trended down.  Echo done this AM with results pending.  He can to home today.    [name] [facility] 11/05/2013 10:27 AM

## 2013-11-06 ENCOUNTER — Encounter: Payer: Self-pay | Admitting: Cardiovascular Disease

## 2013-11-06 ENCOUNTER — Ambulatory Visit (INDEPENDENT_AMBULATORY_CARE_PROVIDER_SITE_OTHER): Payer: BC Managed Care – PPO | Admitting: Cardiovascular Disease

## 2013-11-06 VITALS — BP 132/64 | HR 70 | Ht 72.0 in | Wt 193.0 lb

## 2013-11-06 DIAGNOSIS — I4891 Unspecified atrial fibrillation: Secondary | ICD-10-CM

## 2013-11-06 DIAGNOSIS — E785 Hyperlipidemia, unspecified: Secondary | ICD-10-CM

## 2013-11-06 DIAGNOSIS — I2510 Atherosclerotic heart disease of native coronary artery without angina pectoris: Secondary | ICD-10-CM

## 2013-11-06 DIAGNOSIS — I10 Essential (primary) hypertension: Secondary | ICD-10-CM

## 2013-11-06 NOTE — Progress Notes (Signed)
History of Present Illness: 73 yo WM with history of DM, HTN, hyperlipidemia and CAD with admission to [facility] April 2014 with NSTEMI. He is here today for follow up. Admitted to [facility] 10/02/12 with inferior STEMI and found to have occluded RCA treated with DES. He did well and was discharged on 10/04/12. Readmitted 02/17/13 with NSTEMI. DES placed in severe first OM stenosis. He has diffuse moderate disease in all vessels. Stress myoview 08/07/13 with LVEF-=44%, inferior scar, no ischemia. Admitted to [facility] 11/03/12 with SOB and chest pain. He was found to be in atrial flutter with RVR. He converted to sinus on diltiazem drip. Echo 11/05/12 with normal LV size and function. He was started on Eliquis and discharged on 11/05/12. Plans to stop ASA as an outpatient. His metoprolol was increased to 50 mg po BID.   He is here today for follow up. No chest pain, SOB, palpitations, near syncope or syncope. Feeling well.   Primary Care Physician: Linna Darner  Last Lipid Profile:Lipid Panel     Component Value Date/Time   CHOL 170 11/04/2013 0617   TRIG 166* 11/04/2013 0617   HDL 34* 11/04/2013 0617   CHOLHDL 5.0 11/04/2013 0617   VLDL 33 11/04/2013 0617   LDLCALC 103* 11/04/2013 0617     Past Medical History  Diagnosis Date  . CAD (coronary artery disease), native coronary artery     a. s/p cath May 2011 with placement of DES distal RCA;  b. 01/2013 NSTEMI/Cath/PCI: LM 20d, LAD 30ost, D1 70, LCX 57m, OM1 99p (2.75x12 Promus Premier DES), 35m, 50d in branches, RCA Ca2+ prox, 40p, patent stents mid/dist, RPL 50 small, PDA 60/80, EF 45% inf HK.  [name] Kitchen Hypertension   . Hyperlipidemia   . Diabetes mellitus     AODM  . Anemia, unspecified   . Colorectal polyps 2002  . Erectile dysfunction   . Diastolic dysfunction     a. 01/2013 Echo: EF 55%, mid-dist inflat HK, Gr1 DD, Triv AI/TR, Mild MR.  [name] Kitchen Atrial flutter     a. Dx 11/2013. Spont conv. Placed on eliquis.  . Acute renal insufficiency     a. During 11/2013  adm with atrial flutter.    Past Surgical History  Procedure Laterality Date  . Wisdom tooth extraction    . Colonoscopy w/ polypectomy  2002    [facility]  . Infantile paralysis facial asymmetry    . Coronary stent placement  2007, 2011  . Colonoscopy w/ polypectomy  2004; 2007 negative    Current Outpatient Prescriptions  Medication Sig Dispense Refill  . apixaban (ELIQUIS) 5 MG TABS tablet Take 1 tablet (5 mg total) by mouth 2 (two) times daily.  60 tablet  6  . aspirin 81 MG tablet Take 81 mg by mouth daily.      . calcium carbonate (OS-CAL) 600 MG TABS tablet Take 600 mg by mouth 2 (two) times daily with a meal.      . cholecalciferol (VITAMIN D) 1000 UNITS tablet Take 1,000 Units by mouth daily.      . clopidogrel (PLAVIX) 75 MG tablet Take 1 tablet (75 mg total) by mouth daily.  90 tablet  3  . fexofenadine (ALLEGRA) 180 MG tablet Take 180 mg by mouth daily.        . metFORMIN (GLUCOPHAGE) 500 MG tablet Take 500 mg by mouth 2 (two) times daily with a meal.      . metoprolol tartrate (LOPRESSOR) 50 MG tablet Take  1 tablet (50 mg total) by mouth 2 (two) times daily.  60 tablet  6  . Multiple Vitamin (MULTIVITAMIN) tablet Take 1 tablet by mouth daily.        . nitroGLYCERIN (NITROSTAT) 0.4 MG SL tablet Place 1 tablet (0.4 mg total) under the tongue every 5 (five) minutes as needed for chest pain.  25 tablet  6  . simvastatin (ZOCOR) 40 MG tablet Take 1 tablet (40 mg total) by mouth at bedtime.  90 tablet  3  . vitamin E 100 UNIT capsule Take 100 Units by mouth daily.       No current facility-administered medications for this visit.    Allergies  Allergen Reactions  . Brilinta [Ticagrelor]     Arthralgias & myalgias  . Crestor [Rosuvastatin]     Myalgias (takes Zocor)  . Lipitor [Atorvastatin]     Myalgias (takes Zocor)    History   Social History  . Marital Status: Married    Spouse Name: N/A    Number of Children: 1  . Years of Education: N/A   Occupational  History  . maintenance     full time   Social History Main Topics  . Smoking status: Former Smoker    Quit date: 11/02/1984  . Smokeless tobacco: Never Used     Comment: smoked [place], up to 3 ppd (!)  . Alcohol Use: No  . Drug Use: No  . Sexual Activity: Not on file   Other Topics Concern  . Not on file   Social History Narrative  . No narrative on file    Family History  Problem Relation Age of Onset  . Heart attack Brother 33  . Colon cancer Brother   . Diabetes Neg Hx   . Stroke Neg Hx   . Hypertension Neg Hx     Review of Systems:  As stated in the HPI and otherwise negative.   BP 132/64  Pulse 70  Ht 6' (1.829 m)  Wt 193 lb (87.544 kg)  BMI 26.17 kg/m2  Physical Examination: General: Well developed, well nourished, NAD HEENT: OP clear, mucus membranes moist SKIN: warm, dry. No rashes. Neuro: No focal deficits Musculoskeletal: Muscle strength 5/5 all ext Psychiatric: Mood and affect normal Neck: No JVD, no carotid bruits, no thyromegaly, no lymphadenopathy. Lungs:Clear bilaterally, no wheezes, rhonci, crackles Cardiovascular: Regular rate and rhythm. No murmurs, gallops or rubs. Abdomen:Soft. Bowel sounds present. Non-tender.  Extremities: No lower extremity edema. Pulses are 2 + in the bilateral DP/PT.  Cardiac cath 02/20/13: Left main: Distal 20% stenosis.  Left Anterior Descending Artery: Moderate caliber vessel that does not reach the apex. The ostium has a 30% stenosis. The mid vessel has diffuse plaque. The first diagonal branch is small to moderate in caliber (2.0 mm) and has tandem 70% stenoses.  Circumflex Artery: Large caliber vessel with large first obtuse marginal branch. The first OM branch arises early and has a 99% stenosis in the proximal segment. The mid segment of the OM branch has a 50% stenosis. Distally the OM branch bifurcates and there is complex 50% stenosis involving both vessels beyond the branch point, non-flow limiting. The AV  groove Circumflex has 70% stenosis just after the takeoff of the OM branch but the vessel is small in caliber.  Right Coronary Artery: Large dominant vessel with severe calcification in the proximal and mid vessel. The proximal vessel has a 40% stenosis. The mid vessel has a patent stent. The distal stented segment is  patent with minimal restenosis. The posterolateral branch is small in caliber and has a 50% stenosis. The PDA is moderate in caliber (2.25 mm vessel) and in the distal segment there is a 60% stenosis followed by a 80% stenosis.  Left Ventricular Angiogram: LVEF 45% with inferior wall hypokinesis.   Stress myoview 08/07/13:  Stress Procedure: The patient exercised on the treadmill utilizing the Alhassan Protocol for 6:01 minutes. The patient stopped due to fatigue and denied any chest pain. There were occasional PVC's and rare PAC's. Technetium 65m Sestamibi was injected at peak exercise and myocardial perfusion imaging was performed after a brief delay.  Stress ECG: Significant ST abnormalities consistent with ischemia.  QPS  Raw Data Images: Acquisition technically good; normal left ventricular size.  Stress Images: There is decreased uptake in the inferior wall.  Rest Images: There is decreased uptake in the inferior wall.  Subtraction (SDS): There is a fixed defect that is most consistent with a previous infarction.  Transient Ischemic Dilatation (Normal <1.22): n/a  Lung/Heart Ratio (Normal <0.45): 0.38  Quantitative Gated Spect Images  QGS EDV: 119 ml  QGS ESV: 67 ml  Impression  Exercise Capacity: Fair exercise capacity.  BP Response: Hypertensive blood pressure response.  Clinical Symptoms: There is dyspnea.  ECG Impression: Significant ST abnormalities consistent with ischemia.  Comparison with Prior Nuclear Study: No images to compare  Overall Impression: Low risk stress nuclear study with a large, severe, fixed inferior defect consistent with prior infarct; no ischemia.    LV Ejection Fraction: 44%. LV Wall Motion: Inferior akinesis.  Echo 11/05/13: Left ventricle: The cavity size was at the upper limits of normal. Wall thickness was increased in a pattern of mild LVH. The estimated ejection fraction was 55%. There is hypokinesis of the basalinferolateral myocardium. Doppler parameters are consistent with abnormal left ventricular relaxation (grade 1 diastolic dysfunction). - Aortic valve: Mild regurgitation. - Mitral valve: Trivial regurgitation. - Left atrium: The atrium was moderately dilated. - Right atrium: Central venous pressure: 82mm Hg (est). - Atrial septum: No defect or patent foramen ovale was identified. - Tricuspid valve: Trivial regurgitation. - Pulmonary arteries: PA peak pressure: 19mm Hg (S). - Pericardium, extracardiac: There was no pericardial effusion.  EKG: NSR, rate 70 bpm. Inferior Q-waves. Non-specific T wave abnormalities.   Assessment and Plan:   1. CAD: Stable. He has undergone DES placement in the RCA in setting of inferior STEMI in December 2013 and repeat catheterization 02/20/13 in setting of NSTEMI at which time a DES was placed in the first OM branch. He is on ASA and Plavix. Will stop ASA since he has been started on Eliquis for anti-coagulation with new diagnosis of atrial flutter. Tolerating statin. Stress test without ischemia 08/07/13.  No changes today.   2. HTN: BP is controlled. No changes.   3. Hyperlipidemia: He is on a statin. Will continue Zocor 20 mg po QHS.   4. Atrial flutter: Sinus today. Will continue Eliquis for anti-coagulation. Will give samples today and then 30 day free card.  Metoprolol for rate and rhythm control.

## 2013-11-06 NOTE — Telephone Encounter (Signed)
Pt was seen in office today

## 2013-11-06 NOTE — Care Management (Signed)
Late entry 1028 11-06-13 CM received referral for eliquis. Pt left AMA per MD notes and CM was not able to assist pt before leaving. No further needs from CM at this time. Bethena Roys, RN,BSN [phone]

## 2013-11-06 NOTE — Patient Instructions (Addendum)
Your physician recommends that you schedule a follow-up appointment in:  6-8 weeks. --December 20, 2013 at [place] has recommended you make the following change in your medication:  Stop aspirin

## 2013-11-10 ENCOUNTER — Telehealth: Payer: Self-pay | Admitting: Cardiovascular Disease

## 2013-11-10 DIAGNOSIS — I10 Essential (primary) hypertension: Secondary | ICD-10-CM

## 2013-11-10 MED ORDER — LISINOPRIL 10 MG PO TABS: 10.0000 mg | ORAL_TABLET | Freq: Every day | ORAL | Status: DC

## 2013-11-10 NOTE — Telephone Encounter (Signed)
Lets add Lisinopril 10 mg po Qdaily and recheck BMET in 7 days.

## 2013-11-10 NOTE — Telephone Encounter (Signed)
New message     bp is still high--159/74 this am----feeling lightheaded sometimes.  Dr increased medication but do not think it is working.

## 2013-11-10 NOTE — Telephone Encounter (Signed)
Spoke with pt who reports blood pressure was 159/83 last night and this AM.  Heart rate 74. He has been taking increased dose of metoprolol since hospitalization. Occasional light-headedness. No headache or feeling like he may pass out.

## 2013-11-10 NOTE — Telephone Encounter (Signed)
Pt notified. He will come in for BMP on 11/17/13. Will send prescription to [facility] in [place].

## 2013-11-17 ENCOUNTER — Other Ambulatory Visit (INDEPENDENT_AMBULATORY_CARE_PROVIDER_SITE_OTHER): Payer: BC Managed Care – PPO

## 2013-11-17 DIAGNOSIS — E119 Type 2 diabetes mellitus without complications: Secondary | ICD-10-CM

## 2013-11-17 DIAGNOSIS — I10 Essential (primary) hypertension: Secondary | ICD-10-CM

## 2013-11-17 LAB — BASIC METABOLIC PANEL
BUN: 18 mg/dL (ref 6–23)
CO2: 30 mEq/L (ref 19–32)
Calcium: 9.1 mg/dL (ref 8.4–10.5)
Chloride: 102 mEq/L (ref 96–112)
Creatinine, Ser: 1.5 mg/dL (ref 0.4–1.5)
GFR: 49.9 mL/min — ABNORMAL LOW (ref >60.00)
GLUCOSE: 108 mg/dL — AB (ref 70–99)
POTASSIUM: 3.9 meq/L (ref 3.5–5.1)
Sodium: 139 mEq/L (ref 135–145)

## 2013-11-22 ENCOUNTER — Telehealth: Payer: Self-pay | Admitting: *Deleted

## 2013-11-22 NOTE — Telephone Encounter (Signed)
Spoke with pt and reviewed BMP results with him. He reports blood pressure today was 136/85, heart rate 60. No other readings available.  He will monitor and call if 140/90 or greater.  He reports he continues to have episodes where he is short of breath and tires easily.  Other times he is able to do activities without shortness of breath. No chest pain. States heart rate is consistently in 60-65 range. Shortness of breath is same as at recent office visit with Dr. Angelena Form. He has follow up visit planned on December 20, 2013. I instructed him to keep this appointment and let us know if symptoms worsen.

## 2013-11-22 NOTE — Telephone Encounter (Signed)
Thanks, chris 

## 2013-12-20 ENCOUNTER — Encounter: Payer: Self-pay | Admitting: *Deleted

## 2013-12-20 ENCOUNTER — Ambulatory Visit (INDEPENDENT_AMBULATORY_CARE_PROVIDER_SITE_OTHER): Payer: BC Managed Care – PPO | Admitting: Cardiovascular Disease

## 2013-12-20 ENCOUNTER — Ambulatory Visit: Payer: BC Managed Care – PPO | Admitting: Cardiovascular Disease

## 2013-12-20 ENCOUNTER — Encounter: Payer: Self-pay | Admitting: Cardiovascular Disease

## 2013-12-20 VITALS — BP 150/68 | HR 75 | Ht 73.0 in | Wt 201.0 lb

## 2013-12-20 DIAGNOSIS — R079 Chest pain, unspecified: Secondary | ICD-10-CM

## 2013-12-20 DIAGNOSIS — E785 Hyperlipidemia, unspecified: Secondary | ICD-10-CM

## 2013-12-20 DIAGNOSIS — I10 Essential (primary) hypertension: Secondary | ICD-10-CM

## 2013-12-20 DIAGNOSIS — I2510 Atherosclerotic heart disease of native coronary artery without angina pectoris: Secondary | ICD-10-CM

## 2013-12-20 LAB — CBC WITH DIFFERENTIAL/PLATELET
BASOS PCT: 0.6 % (ref 0.0–3.0)
Basophils Absolute: 0 10*3/uL (ref 0.0–0.1)
EOS PCT: 4.8 % (ref 0.0–5.0)
Eosinophils Absolute: 0.4 10*3/uL (ref 0.0–0.7)
HCT: 38 % — ABNORMAL LOW (ref 39.0–52.0)
HEMOGLOBIN: 12.6 g/dL — AB (ref 13.0–17.0)
LYMPHS PCT: 21.3 % (ref 12.0–46.0)
Lymphs Abs: 1.6 10*3/uL (ref 0.7–4.0)
MCHC: 33 g/dL (ref 30.0–36.0)
MCV: 90 fl (ref 78.0–100.0)
MONOS PCT: 8.2 % (ref 3.0–12.0)
Monocytes Absolute: 0.6 10*3/uL (ref 0.1–1.0)
NEUTROS ABS: 4.8 10*3/uL (ref 1.4–7.7)
Neutrophils Relative %: 65.1 % (ref 43.0–77.0)
Platelets: 197 10*3/uL (ref 150.0–400.0)
RBC: 4.22 Mil/uL (ref 4.22–5.81)
RDW: 13.1 % (ref 11.5–14.6)
WBC: 7.4 10*3/uL (ref 4.5–10.5)

## 2013-12-20 LAB — BASIC METABOLIC PANEL
BUN: 17 mg/dL (ref 6–23)
CO2: 28 meq/L (ref 19–32)
Calcium: 9.6 mg/dL (ref 8.4–10.5)
Chloride: 101 mEq/L (ref 96–112)
Creatinine, Ser: 1.4 mg/dL (ref 0.4–1.5)
GFR: 54.57 mL/min — ABNORMAL LOW (ref >60.00)
GLUCOSE: 167 mg/dL — AB (ref 70–99)
POTASSIUM: 4 meq/L (ref 3.5–5.1)
SODIUM: 137 meq/L (ref 135–145)

## 2013-12-20 LAB — PROTIME-INR
INR: 1.2 ratio — AB (ref 0.8–1.0)
Prothrombin Time: 12.7 s — ABNORMAL HIGH (ref 10.2–12.4)

## 2013-12-20 MED ORDER — LISINOPRIL 20 MG PO TABS: 20.0000 mg | ORAL_TABLET | Freq: Every day | ORAL | Status: DC

## 2013-12-20 NOTE — Patient Instructions (Addendum)
Your physician recommends that you schedule a follow-up appointment in:  About 5 weeks.   Your physician has requested that you have a cardiac catheterization. Cardiac catheterization is used to diagnose and/or treat various heart conditions. Doctors may recommend this procedure for a number of different reasons. The most common reason is to evaluate chest pain. Chest pain can be a symptom of coronary artery disease (CAD), and cardiac catheterization can show whether plaque is narrowing or blocking your heart's arteries. This procedure is also used to evaluate the valves, as well as measure the blood flow and oxygen levels in different parts of your heart. For further information please visit [email or web address]. Please follow instruction sheet, as given. Scheduled for December 26, 2013   Your physician has recommended you make the following change in your medication:  Increase lisinopril to 20 mg by mouth daily

## 2013-12-20 NOTE — Progress Notes (Signed)
History of Present Illness: 73 yo WM with history of DM, HTN, hyperlipidemia and CAD with admission to [facility] April 2014 with NSTEMI. He is here today for follow up. Admitted to [facility] 10/02/12 with inferior STEMI and found to have occluded RCA treated with DES. He did well and was discharged on 10/04/12. Readmitted 02/17/13 with NSTEMI. DES placed in severe first OM stenosis. He has diffuse moderate disease in all vessels. Stress myoview 08/07/13 with LVEF-=44%, inferior scar, no ischemia. Admitted to [facility] 11/03/12 with SOB and chest pain. He was found to be in atrial flutter with RVR. He converted to sinus on diltiazem drip. Echo 11/05/12 with normal LV size and function. He was started on Eliquis and discharged on 11/05/12. His metoprolol was increased to 50 mg po BID.   He is here today for follow up. He has been feeling poorly with chest pain and dyspnea with minimal exertion, fatigue. No palpitations, near syncope or syncope.  Primary Care Physician: Linna Darner  Last Lipid Profile:Lipid Panel     Component Value Date/Time   CHOL 170 11/04/2013 0617   TRIG 166* 11/04/2013 0617   HDL 34* 11/04/2013 0617   CHOLHDL 5.0 11/04/2013 0617   VLDL 33 11/04/2013 0617   LDLCALC 103* 11/04/2013 0617     Past Medical History  Diagnosis Date  . CAD (coronary artery disease), native coronary artery     a. s/p cath May 2011 with placement of DES distal RCA;  b. 01/2013 NSTEMI/Cath/PCI: LM 20d, LAD 30ost, D1 70, LCX 57m, OM1 99p (2.75x12 Promus Premier DES), 7m, 50d in branches, RCA Ca2+ prox, 40p, patent stents mid/dist, RPL 50 small, PDA 60/80, EF 45% inf HK.  [name] Kitchen Hypertension   . Hyperlipidemia   . Diabetes mellitus     AODM  . Anemia, unspecified   . Colorectal polyps 2002  . Erectile dysfunction   . Diastolic dysfunction     a. 01/2013 Echo: EF 55%, mid-dist inflat HK, Gr1 DD, Triv AI/TR, Mild MR.  [name] Kitchen Atrial flutter     a. Dx 11/2013. Spont conv. Placed on eliquis.  . Acute renal insufficiency      a. During 11/2013 adm with atrial flutter.    Past Surgical History  Procedure Laterality Date  . Wisdom tooth extraction    . Colonoscopy w/ polypectomy  2002     GI  . Infantile paralysis facial asymmetry    . Coronary stent placement  2007, 2011  . Colonoscopy w/ polypectomy  2004; 2007 negative    Current Outpatient Prescriptions  Medication Sig Dispense Refill  . apixaban (ELIQUIS) 5 MG TABS tablet Take 1 tablet (5 mg total) by mouth 2 (two) times daily.  60 tablet  6  . calcium carbonate (OS-CAL) 600 MG TABS tablet Take 600 mg by mouth 2 (two) times daily with a meal.      . cholecalciferol (VITAMIN D) 1000 UNITS tablet Take 1,000 Units by mouth daily.      . clopidogrel (PLAVIX) 75 MG tablet Take 1 tablet (75 mg total) by mouth daily.  90 tablet  3  . fexofenadine (ALLEGRA) 180 MG tablet Take 180 mg by mouth daily.        [name] Kitchen lisinopril (PRINIVIL,ZESTRIL) 10 MG tablet Take 1 tablet (10 mg total) by mouth daily.  90 tablet  3  . metFORMIN (GLUCOPHAGE) 500 MG tablet Take 500 mg by mouth 2 (two) times daily with a meal.      . metoprolol  tartrate (LOPRESSOR) 50 MG tablet Take 1 tablet (50 mg total) by mouth 2 (two) times daily.  60 tablet  6  . Multiple Vitamin (MULTIVITAMIN) tablet Take 1 tablet by mouth daily.        . nitroGLYCERIN (NITROSTAT) 0.4 MG SL tablet Place 1 tablet (0.4 mg total) under the tongue every 5 (five) minutes as needed for chest pain.  25 tablet  6  . simvastatin (ZOCOR) 40 MG tablet Take 1 tablet (40 mg total) by mouth at bedtime.  90 tablet  3  . vitamin E 100 UNIT capsule Take 100 Units by mouth daily.       No current facility-administered medications for this visit.    Allergies  Allergen Reactions  . Brilinta [Ticagrelor]     Arthralgias & myalgias  . Crestor [Rosuvastatin]     Myalgias (takes Zocor)  . Lipitor [Atorvastatin]     Myalgias (takes Zocor)    History   Social History  . Marital Status: Married    Spouse Name: N/A     Number of Children: 1  . Years of Education: N/A   Occupational History  . maintenance     full time   Social History Main Topics  . Smoking status: Former Smoker    Quit date: 11/02/1984  . Smokeless tobacco: Never Used     Comment: smoked [place], up to 3 ppd (!)  . Alcohol Use: No  . Drug Use: No  . Sexual Activity: Not on file   Other Topics Concern  . Not on file   Social History Narrative  . No narrative on file    Family History  Problem Relation Age of Onset  . Heart attack Brother 62  . Colon cancer Brother   . Diabetes Neg Hx   . Stroke Neg Hx   . Hypertension Neg Hx     Review of Systems:  As stated in the HPI and otherwise negative.   BP 150/68  Pulse 75  Ht 6\' 1"  (1.854 m)  Wt 201 lb (91.173 kg)  BMI 26.52 kg/m2  Physical Examination: General: Well developed, well nourished, NAD HEENT: OP clear, mucus membranes moist SKIN: warm, dry. No rashes. Neuro: No focal deficits Musculoskeletal: Muscle strength 5/5 all ext Psychiatric: Mood and affect normal Neck: No JVD, no carotid bruits, no thyromegaly, no lymphadenopathy. Lungs:Clear bilaterally, no wheezes, rhonci, crackles Cardiovascular: Regular rate and rhythm. No murmurs, gallops or rubs. Abdomen:Soft. Bowel sounds present. Non-tender.  Extremities: No lower extremity edema. Pulses are 2 + in the bilateral DP/PT.  Cardiac cath 02/20/13: Left main: Distal 20% stenosis.  Left Anterior Descending Artery: Moderate caliber vessel that does not reach the apex. The ostium has a 30% stenosis. The mid vessel has diffuse plaque. The first diagonal branch is small to moderate in caliber (2.0 mm) and has tandem 70% stenoses.  Circumflex Artery: Large caliber vessel with large first obtuse marginal branch. The first OM branch arises early and has a 99% stenosis in the proximal segment. The mid segment of the OM branch has a 50% stenosis. Distally the OM branch bifurcates and there is complex 50% stenosis  involving both vessels beyond the branch point, non-flow limiting. The AV groove Circumflex has 70% stenosis just after the takeoff of the OM branch but the vessel is small in caliber.  Right Coronary Artery: Large dominant vessel with severe calcification in the proximal and mid vessel. The proximal vessel has a 40% stenosis. The mid vessel has a  patent stent. The distal stented segment is patent with minimal restenosis. The posterolateral branch is small in caliber and has a 50% stenosis. The PDA is moderate in caliber (2.25 mm vessel) and in the distal segment there is a 60% stenosis followed by a 80% stenosis.  Left Ventricular Angiogram: LVEF 45% with inferior wall hypokinesis.   Stress myoview 08/07/13:  Stress Procedure: The patient exercised on the treadmill utilizing the Monnie Protocol for 6:01 minutes. The patient stopped due to fatigue and denied any chest pain. There were occasional PVC's and rare PAC's. Technetium 25m Sestamibi was injected at peak exercise and myocardial perfusion imaging was performed after a brief delay.  Stress ECG: Significant ST abnormalities consistent with ischemia.  QPS  Raw Data Images: Acquisition technically good; normal left ventricular size.  Stress Images: There is decreased uptake in the inferior wall.  Rest Images: There is decreased uptake in the inferior wall.  Subtraction (SDS): There is a fixed defect that is most consistent with a previous infarction.  Transient Ischemic Dilatation (Normal <1.22): n/a  Lung/Heart Ratio (Normal <0.45): 0.38  Quantitative Gated Spect Images  QGS EDV: 119 ml  QGS ESV: 67 ml  Impression  Exercise Capacity: Fair exercise capacity.  BP Response: Hypertensive blood pressure response.  Clinical Symptoms: There is dyspnea.  ECG Impression: Significant ST abnormalities consistent with ischemia.  Comparison with Prior Nuclear Study: No images to compare  Overall Impression: Low risk stress nuclear study with a large,  severe, fixed inferior defect consistent with prior infarct; no ischemia.  LV Ejection Fraction: 44%. LV Wall Motion: Inferior akinesis.  Echo 11/05/13: Left ventricle: The cavity size was at the upper limits of normal. Wall thickness was increased in a pattern of mild LVH. The estimated ejection fraction was 55%. There is hypokinesis of the basalinferolateral myocardium. Doppler parameters are consistent with abnormal left ventricular relaxation (grade 1 diastolic dysfunction). - Aortic valve: Mild regurgitation. - Mitral valve: Trivial regurgitation. - Left atrium: The atrium was moderately dilated. - Right atrium: Central venous pressure: 66mm Hg (est). - Atrial septum: No defect or patent foramen ovale was identified. - Tricuspid valve: Trivial regurgitation. - Pulmonary arteries: PA peak pressure: 28mm Hg (S). - Pericardium, extracardiac: There was no pericardial effusion  Assessment and Plan:   1. CAD: Recent chest pain and dyspnea with exertion.  This is concerning for unstable angina. He has undergone DES placement in the RCA in setting of inferior STEMI in December 2013 and repeat catheterization 02/20/13 in setting of NSTEMI at which time a DES was placed in the first OM branch. He is now on Plavix as single antiplatelet therapy given need for chronic anti-coagulation. Will arrange cardiac cath 12/26/13 with possible PCI. Risks and benefits reviewed today. Pre-cath labs today.   2. HTN: BP is elevated today. Will increase Lisinopril to 20 mg po Qdaily.   3. Hyperlipidemia: He is on a statin. Will continue Zocor 20 mg po QHS.   4. Atrial flutter: Sinus today. Will continue Eliquis for anti-coagulation but will hold Sunday pre-cath. Metoprolol for rate and rhythm control.

## 2013-12-24 ENCOUNTER — Emergency Department (HOSPITAL_COMMUNITY)
Admission: EM | Admit: 2013-12-24 | Discharge: 2013-12-25 | Disposition: A | Discharge status: Home / Self Care | Payer: BC Managed Care – PPO | Attending: Emergency Medicine | Admitting: Emergency Medicine

## 2013-12-24 ENCOUNTER — Encounter (HOSPITAL_COMMUNITY): Payer: Self-pay | Admitting: Emergency Medicine

## 2013-12-24 DIAGNOSIS — Z87891 Personal history of nicotine dependence: Secondary | ICD-10-CM | POA: Insufficient documentation

## 2013-12-24 DIAGNOSIS — Z8679 Personal history of other diseases of the circulatory system: Secondary | ICD-10-CM

## 2013-12-24 DIAGNOSIS — Z7902 Long term (current) use of antithrombotics/antiplatelets: Secondary | ICD-10-CM | POA: Insufficient documentation

## 2013-12-24 DIAGNOSIS — E785 Hyperlipidemia, unspecified: Secondary | ICD-10-CM | POA: Insufficient documentation

## 2013-12-24 DIAGNOSIS — R079 Chest pain, unspecified: Secondary | ICD-10-CM

## 2013-12-24 DIAGNOSIS — R0609 Other forms of dyspnea: Secondary | ICD-10-CM | POA: Insufficient documentation

## 2013-12-24 DIAGNOSIS — I4892 Unspecified atrial flutter: Secondary | ICD-10-CM | POA: Insufficient documentation

## 2013-12-24 DIAGNOSIS — Z79899 Other long term (current) drug therapy: Secondary | ICD-10-CM | POA: Insufficient documentation

## 2013-12-24 DIAGNOSIS — I10 Essential (primary) hypertension: Secondary | ICD-10-CM | POA: Insufficient documentation

## 2013-12-24 DIAGNOSIS — Z860100 Personal history of colon polyps, unspecified: Secondary | ICD-10-CM | POA: Insufficient documentation

## 2013-12-24 DIAGNOSIS — R002 Palpitations: Secondary | ICD-10-CM

## 2013-12-24 DIAGNOSIS — Z862 Personal history of diseases of the blood and blood-forming organs and certain disorders involving the immune mechanism: Secondary | ICD-10-CM | POA: Insufficient documentation

## 2013-12-24 DIAGNOSIS — R0989 Other specified symptoms and signs involving the circulatory and respiratory systems: Secondary | ICD-10-CM | POA: Insufficient documentation

## 2013-12-24 DIAGNOSIS — Z9861 Coronary angioplasty status: Secondary | ICD-10-CM | POA: Insufficient documentation

## 2013-12-24 DIAGNOSIS — Z87448 Personal history of other diseases of urinary system: Secondary | ICD-10-CM | POA: Insufficient documentation

## 2013-12-24 DIAGNOSIS — I252 Old myocardial infarction: Secondary | ICD-10-CM | POA: Insufficient documentation

## 2013-12-24 DIAGNOSIS — I2510 Atherosclerotic heart disease of native coronary artery without angina pectoris: Secondary | ICD-10-CM | POA: Insufficient documentation

## 2013-12-24 DIAGNOSIS — R0600 Dyspnea, unspecified: Secondary | ICD-10-CM

## 2013-12-24 DIAGNOSIS — E119 Type 2 diabetes mellitus without complications: Secondary | ICD-10-CM | POA: Insufficient documentation

## 2013-12-24 LAB — COMPREHENSIVE METABOLIC PANEL
ALT: 16 U/L (ref 0–53)
AST: 16 U/L (ref 0–37)
Albumin: 3.8 g/dL (ref 3.5–5.2)
Alkaline Phosphatase: 64 U/L (ref 39–117)
BUN: 24 mg/dL — ABNORMAL HIGH (ref 6–23)
CALCIUM: 9.5 mg/dL (ref 8.4–10.5)
CHLORIDE: 102 meq/L (ref 96–112)
CO2: 27 meq/L (ref 19–32)
Creatinine, Ser: 1.32 mg/dL (ref 0.50–1.35)
GFR, EST AFRICAN AMERICAN: 60 mL/min — AB (ref >90)
GFR, EST NON AFRICAN AMERICAN: 52 mL/min — AB (ref >90)
GLUCOSE: 140 mg/dL — AB (ref 70–99)
Potassium: 4.4 mEq/L (ref 3.7–5.3)
Sodium: 142 mEq/L (ref 137–147)
Total Protein: 7 g/dL (ref 6.0–8.3)

## 2013-12-24 LAB — I-STAT TROPONIN, ED: Troponin i, poc: 0.01 ng/mL (ref 0.00–0.08)

## 2013-12-24 LAB — CBC WITH DIFFERENTIAL/PLATELET
BASOS ABS: 0.1 10*3/uL (ref 0.0–0.1)
Basophils Relative: 1 % (ref 0–1)
EOS PCT: 5 % (ref 0–5)
Eosinophils Absolute: 0.4 10*3/uL (ref 0.0–0.7)
HCT: 39.1 % (ref 39.0–52.0)
Hemoglobin: 13.2 g/dL (ref 13.0–17.0)
LYMPHS ABS: 2.1 10*3/uL (ref 0.7–4.0)
LYMPHS PCT: 24 % (ref 12–46)
MCH: 29.8 pg (ref 26.0–34.0)
MCHC: 33.8 g/dL (ref 30.0–36.0)
MCV: 88.3 fL (ref 78.0–100.0)
Monocytes Absolute: 0.7 10*3/uL (ref 0.1–1.0)
Monocytes Relative: 8 % (ref 3–12)
NEUTROS ABS: 5.6 10*3/uL (ref 1.7–7.7)
Neutrophils Relative %: 63 % (ref 43–77)
PLATELETS: 181 10*3/uL (ref 150–400)
RBC: 4.43 MIL/uL (ref 4.22–5.81)
RDW: 12.8 % (ref 11.5–15.5)
WBC: 9 10*3/uL (ref 4.0–10.5)

## 2013-12-24 NOTE — ED Provider Notes (Signed)
CSN: [id number]     Arrival date & time 12/24/13  2132 History   First MD Initiated Contact with Patient 12/24/13 2151     Chief Complaint  Patient presents with  . Chest Pain     (Consider location/radiation/quality/duration/timing/severity/associated sxs/prior Treatment) HPI Patient reports about 8 PM he was sitting at home and had acute onset of having pain in his left chest radiating into his left arm. He states he could feel his heart racing. He took 2 nitroglycerin without relief. He called EMS who gave him another nitroglycerin. He also took 3 baby aspirin. He reports an EMS ambulance his heart rate was 130 to 150 and while in the ambulance his heart rate acutely dropped to 74 and he felt better. He denies any sweating, nausea, or vomiting. He states he did have some shortness of breath when his heart was racing. He states however now his shortness of breath is gone and he feels fine.  Patient reports he has been having chest pain and shortness of breath but he takes nitroglycerin for. He is supposed to have a cardiac catheterization in 2 days by his cardiologist. Patient has 4 stents and they're going to evaluate his stents and see if he has 2 blockages. He also reports he was admitted on January 2 for his first episode of atrial flutter. He states he thinks he had an episode of atrial flutter tonight.  PCP Dr  Linna Darner Cardiology Dr Julianne Handler  Past Medical History  Diagnosis Date  . CAD (coronary artery disease), native coronary artery     a. s/p cath May 2011 with placement of DES distal RCA;  b. 01/2013 NSTEMI/Cath/PCI: LM 20d, LAD 30ost, D1 70, LCX 59m, OM1 99p (2.75x12 Promus Premier DES), 6m, 50d in branches, RCA Ca2+ prox, 40p, patent stents mid/dist, RPL 50 small, PDA 60/80, EF 45% inf HK.  [name] Kitchen Hypertension   . Hyperlipidemia   . Diabetes mellitus     AODM  . Anemia, unspecified   . Colorectal polyps 2002  . Erectile dysfunction   . Diastolic dysfunction     a. 01/2013 Echo:  EF 55%, mid-dist inflat HK, Gr1 DD, Triv AI/TR, Mild MR.  [name] Kitchen Atrial flutter     a. Dx 11/2013. Spont conv. Placed on eliquis.  . Acute renal insufficiency     a. During 11/2013 adm with atrial flutter.   Past Surgical History  Procedure Laterality Date  . Wisdom tooth extraction    . Colonoscopy w/ polypectomy  2002    [facility]  . Infantile paralysis facial asymmetry    . Coronary stent placement  2007, 2011  . Colonoscopy w/ polypectomy  2004; 2007 negative   Family History  Problem Relation Age of Onset  . Heart attack Brother 62  . Colon cancer Brother   . Diabetes Neg Hx   . Stroke Neg Hx   . Hypertension Neg Hx    History  Substance Use Topics  . Smoking status: Former Smoker    Quit date: 11/02/1984  . Smokeless tobacco: Never Used     Comment: smoked [place], up to 3 ppd (!)  . Alcohol Use: No  lives with spouse Lives at home  Review of Systems  All other systems reviewed and are negative.      Allergies  Brilinta; Crestor; and Lipitor  Home Medications   Current Outpatient Rx  Name  Route  Sig  Dispense  Refill  . apixaban (ELIQUIS) 5 MG TABS tablet  Oral   Take 5 mg by mouth 2 (two) times daily.         . calcium carbonate (OS-CAL) 600 MG TABS tablet   Oral   Take 600 mg by mouth 2 (two) times daily with a meal.         . cholecalciferol (VITAMIN D) 1000 UNITS tablet   Oral   Take 1,000 Units by mouth daily.         . clopidogrel (PLAVIX) 75 MG tablet   Oral   Take 75 mg by mouth daily with breakfast.         . fexofenadine (ALLEGRA) 180 MG tablet   Oral   Take 180 mg by mouth daily.           [name] Kitchen lisinopril (PRINIVIL,ZESTRIL) 20 MG tablet   Oral   Take 20 mg by mouth daily.         . metFORMIN (GLUCOPHAGE) 500 MG tablet   Oral   Take 500 mg by mouth 2 (two) times daily with a meal.         . metoprolol (LOPRESSOR) 50 MG tablet   Oral   Take 50 mg by mouth 2 (two) times daily.         . Multiple Vitamin  (MULTIVITAMIN) tablet   Oral   Take 1 tablet by mouth daily.           . nitroGLYCERIN (NITROSTAT) 0.4 MG SL tablet   Sublingual   Place 0.4 mg under the tongue every 5 (five) minutes as needed for chest pain.         . simvastatin (ZOCOR) 40 MG tablet   Oral   Take 40 mg by mouth every evening.         . vitamin E 100 UNIT capsule   Oral   Take 100 Units by mouth daily.          BP 104/66  Pulse 73  Temp(Src) 98.2 F (36.8 C)  Resp 16  Ht 5\' 11"  (1.803 m)  Wt 185 lb (83.915 kg)  BMI 25.81 kg/m2  SpO2 99%  Vital signs normal   Physical Exam  Nursing note and vitals reviewed. Constitutional: He is oriented to person, place, and time. He appears well-developed and well-nourished.  Non-toxic appearance. He does not appear ill. No distress.  HENT:  Head: Normocephalic and atraumatic.  Right Ear: External ear normal.  Left Ear: External ear normal.  Nose: Nose normal. No mucosal edema or rhinorrhea.  Mouth/Throat: Oropharynx is clear and moist and mucous membranes are normal. No dental abscesses or uvula swelling.  Eyes: Conjunctivae and EOM are normal. Pupils are equal, round, and reactive to light.  Neck: Normal range of motion and full passive range of motion without pain. Neck supple.  Cardiovascular: Normal rate, regular rhythm and normal heart sounds.  Exam reveals no gallop and no friction rub.   No murmur heard. Pulmonary/Chest: Effort normal and breath sounds normal. No respiratory distress. He has no wheezes. He has no rhonchi. He has no rales. He exhibits no tenderness and no crepitus.  Abdominal: Soft. Normal appearance and bowel sounds are normal. He exhibits no distension. There is no tenderness. There is no rebound and no guarding.  Musculoskeletal: Normal range of motion. He exhibits no edema and no tenderness.  Moves all extremities well.   Neurological: He is alert and oriented to person, place, and time. He has normal strength. No cranial nerve  deficit.  Skin: Skin  is warm, dry and intact. No rash noted. No erythema. No pallor.  Psychiatric: He has a normal mood and affect. His speech is normal and behavior is normal. His mood appears not anxious.    ED Course  Procedures (including critical care time)  Review of his prior records shows patient was admitted on January 2 with atrial flutter with fast ventricular response. He was converted with Cardizem. He had a echocardiogram done January 4 which showed normal left ventricular size and function. His metoprolol was increased to 50 mg twice a day.  00:18 D/W Dr Wynonia Lawman, asks to get a second troponin now, and if normal he can be discharged.   PT given Dr Thurman Coyer recommendation to get a second troponin. Pt wants to leave, have discussed he has had 2 NSTEMI in the past and the second troponin will make sure he hasn't had another event. He is reluctantly staying for the second troponin.  00:40 pt turned over to Dr Cheri Guppy at change of shift to check his second troponin.   Labs Review Results for orders placed during the hospital encounter of 12/24/13  CBC WITH DIFFERENTIAL      Result Value Ref Range   WBC 9.0  4.0 - 10.5 K/uL   RBC 4.43  4.22 - 5.81 MIL/uL   Hemoglobin 13.2  13.0 - 17.0 g/dL   HCT 39.1  39.0 - 52.0 %   MCV 88.3  78.0 - 100.0 fL   MCH 29.8  26.0 - 34.0 pg   MCHC 33.8  30.0 - 36.0 g/dL   RDW 12.8  11.5 - 15.5 %   Platelets 181  150 - 400 K/uL   Neutrophils Relative % 63  43 - 77 %   Neutro Abs 5.6  1.7 - 7.7 K/uL   Lymphocytes Relative 24  12 - 46 %   Lymphs Abs 2.1  0.7 - 4.0 K/uL   Monocytes Relative 8  3 - 12 %   Monocytes Absolute 0.7  0.1 - 1.0 K/uL   Eosinophils Relative 5  0 - 5 %   Eosinophils Absolute 0.4  0.0 - 0.7 K/uL   Basophils Relative 1  0 - 1 %   Basophils Absolute 0.1  0.0 - 0.1 K/uL  COMPREHENSIVE METABOLIC PANEL      Result Value Ref Range   Sodium 142  137 - 147 mEq/L   Potassium 4.4  3.7 - 5.3 mEq/L   Chloride 102  96 - 112 mEq/L    CO2 27  19 - 32 mEq/L   Glucose, Bld 140 (*) 70 - 99 mg/dL   BUN 24 (*) 6 - 23 mg/dL   Creatinine, Ser 1.32  0.50 - 1.35 mg/dL   Calcium 9.5  8.4 - 10.5 mg/dL   Total Protein 7.0  6.0 - 8.3 g/dL   Albumin 3.8  3.5 - 5.2 g/dL   AST 16  0 - 37 U/L   ALT 16  0 - 53 U/L   Alkaline Phosphatase 64  39 - 117 U/L   Total Bilirubin <0.2 (*) 0.3 - 1.2 mg/dL   GFR calc non Af Amer 52 (*) >90 mL/min   GFR calc Af Amer 60 (*) >90 mL/min  I-STAT TROPOININ, ED      Result Value Ref Range   Troponin i, poc 0.01  0.00 - 0.08 ng/mL   Comment 3            Laboratory interpretation all normal    Imaging Review  No results found.  EKG Interpretation    Date/Time:  Sunday December 24 2013 21:39:22 EST Ventricular Rate:  74 PR Interval:  148 QRS Duration: 102 QT Interval:  378 QTC Calculation: 419 R Axis:   58 Text Interpretation:  Sinus rhythm Borderline repolarization abnormality Since last tracing T wave inversion less evident in Lateral leads Confirmed by Nyana Haren  MD-I, Anglea Gordner ([id number]) on 12/24/2013 10:23:17 PM            MDM   Final diagnoses:  Palpitations  H/O atrial flutter  Chest pain  Dyspnea    Disposition pending  Rolland Porter, MD, Abram Sander     Janice Norrie, MD 12/25/13 1510

## 2013-12-24 NOTE — ED Notes (Signed)
Pt to ED via EMS for evaluation of chest pain onset 2000, pt received 3 Nitro 324 ASA- pt has 4 stents in place in the past, pt is scheduled to have cardiac cath done on Tuesday.  Denies any CP upon arrival to ED.

## 2013-12-25 ENCOUNTER — Encounter (HOSPITAL_COMMUNITY): Payer: Self-pay

## 2013-12-25 LAB — I-STAT TROPONIN, ED: Troponin i, poc: 0.01 ng/mL (ref 0.00–0.08)

## 2013-12-25 NOTE — ED Notes (Signed)
Dr.Knapp at bedside  

## 2013-12-25 NOTE — Discharge Instructions (Signed)
Return if you feel worse again. Keep your appointment on Feb 24 th with Dr Angelena Form for your catheretization.

## 2013-12-26 ENCOUNTER — Ambulatory Visit (HOSPITAL_COMMUNITY)
Admission: RE | Admit: 2013-12-26 | Discharge: 2013-12-27 | Disposition: A | Discharge status: Home / Self Care | Payer: BC Managed Care – PPO | Source: Ambulatory Visit | Attending: Cardiovascular Disease | Admitting: Cardiovascular Disease

## 2013-12-26 ENCOUNTER — Encounter (HOSPITAL_COMMUNITY): Payer: Self-pay | Admitting: General Practice

## 2013-12-26 ENCOUNTER — Encounter (HOSPITAL_COMMUNITY)
Admission: RE | Disposition: A | Discharge status: Home / Self Care | Payer: BC Managed Care – PPO | Source: Ambulatory Visit | Attending: Cardiovascular Disease

## 2013-12-26 DIAGNOSIS — E785 Hyperlipidemia, unspecified: Secondary | ICD-10-CM

## 2013-12-26 DIAGNOSIS — I2584 Coronary atherosclerosis due to calcified coronary lesion: Secondary | ICD-10-CM | POA: Insufficient documentation

## 2013-12-26 DIAGNOSIS — Z955 Presence of coronary angioplasty implant and graft: Secondary | ICD-10-CM

## 2013-12-26 DIAGNOSIS — I252 Old myocardial infarction: Secondary | ICD-10-CM | POA: Insufficient documentation

## 2013-12-26 DIAGNOSIS — I200 Unstable angina: Secondary | ICD-10-CM

## 2013-12-26 DIAGNOSIS — E1159 Type 2 diabetes mellitus with other circulatory complications: Secondary | ICD-10-CM

## 2013-12-26 DIAGNOSIS — I2510 Atherosclerotic heart disease of native coronary artery without angina pectoris: Secondary | ICD-10-CM

## 2013-12-26 DIAGNOSIS — E119 Type 2 diabetes mellitus without complications: Secondary | ICD-10-CM | POA: Insufficient documentation

## 2013-12-26 DIAGNOSIS — Z7902 Long term (current) use of antithrombotics/antiplatelets: Secondary | ICD-10-CM | POA: Insufficient documentation

## 2013-12-26 DIAGNOSIS — Z79899 Other long term (current) drug therapy: Secondary | ICD-10-CM | POA: Insufficient documentation

## 2013-12-26 DIAGNOSIS — I4892 Unspecified atrial flutter: Secondary | ICD-10-CM

## 2013-12-26 DIAGNOSIS — R079 Chest pain, unspecified: Secondary | ICD-10-CM

## 2013-12-26 DIAGNOSIS — I10 Essential (primary) hypertension: Secondary | ICD-10-CM

## 2013-12-26 HISTORY — PX: PERCUTANEOUS CORONARY STENT INTERVENTION (PCI-S): SHX5485

## 2013-12-26 HISTORY — DX: Acute myocardial infarction, unspecified: I21.9

## 2013-12-26 HISTORY — PX: CORONARY ANGIOPLASTY: SHX604

## 2013-12-26 HISTORY — PX: LEFT HEART CATHETERIZATION WITH CORONARY ANGIOGRAM: SHX5451

## 2013-12-26 LAB — GLUCOSE, CAPILLARY
Glucose-Capillary: 141 mg/dL — ABNORMAL HIGH (ref 70–99)
Glucose-Capillary: 110 mg/dL — ABNORMAL HIGH (ref 70–99)
Glucose-Capillary: 143 mg/dL — ABNORMAL HIGH (ref 70–99)
Glucose-Capillary: 139 mg/dL — ABNORMAL HIGH (ref 70–99)
Glucose-Capillary: 167 mg/dL — ABNORMAL HIGH (ref 70–99)

## 2013-12-26 LAB — POCT ACTIVATED CLOTTING TIME
Activated Clotting Time: 238 seconds
Activated Clotting Time: 315 seconds

## 2013-12-26 SURGERY — LEFT HEART CATHETERIZATION WITH CORONARY ANGIOGRAM
Anesthesia: LOCAL

## 2013-12-26 MED ORDER — SODIUM CHLORIDE 0.9 % IJ SOLN: 3.0000 mL | Freq: Two times a day (BID) | INTRAMUSCULAR | Status: DC

## 2013-12-26 MED ORDER — DIAZEPAM 5 MG PO TABS
ORAL_TABLET | ORAL | Status: AC
  Filled 2013-12-26: qty 1

## 2013-12-26 MED ORDER — LORATADINE 10 MG PO TABS
10.0000 mg | ORAL_TABLET | Freq: Every day | ORAL | Status: DC
  Administered 2013-12-27: 10 mg via ORAL
  Filled 2013-12-26: qty 1

## 2013-12-26 MED ORDER — ASPIRIN 81 MG PO CHEW: 81.0000 mg | CHEWABLE_TABLET | ORAL | Status: DC

## 2013-12-26 MED ORDER — ASPIRIN 81 MG PO CHEW
81.0000 mg | CHEWABLE_TABLET | Freq: Every day | ORAL | Status: DC
  Filled 2013-12-26: qty 1

## 2013-12-26 MED ORDER — SODIUM CHLORIDE 0.9 % IV SOLN: 250.0000 mL | INTRAVENOUS | Status: DC | PRN

## 2013-12-26 MED ORDER — NITROGLYCERIN 0.2 MG/ML ON CALL CATH LAB
INTRAVENOUS | Status: AC
  Filled 2013-12-26: qty 1

## 2013-12-26 MED ORDER — HEPARIN (PORCINE) IN NACL 2-0.9 UNIT/ML-% IJ SOLN
INTRAMUSCULAR | Status: AC
  Filled 2013-12-26: qty 1500

## 2013-12-26 MED ORDER — FENTANYL CITRATE 0.05 MG/ML IJ SOLN
INTRAMUSCULAR | Status: AC
  Filled 2013-12-26: qty 2

## 2013-12-26 MED ORDER — LIVING WELL WITH DIABETES BOOK
Freq: Once | Status: AC
  Administered 2013-12-26: 22:00:00
  Filled 2013-12-26: qty 1

## 2013-12-26 MED ORDER — NITROGLYCERIN 0.4 MG SL SUBL: 0.4000 mg | SUBLINGUAL_TABLET | SUBLINGUAL | Status: DC | PRN

## 2013-12-26 MED ORDER — LISINOPRIL 20 MG PO TABS
20.0000 mg | ORAL_TABLET | Freq: Every day | ORAL | Status: DC
  Administered 2013-12-27: 20 mg via ORAL
  Filled 2013-12-26: qty 1

## 2013-12-26 MED ORDER — METOPROLOL TARTRATE 50 MG PO TABS
50.0000 mg | ORAL_TABLET | Freq: Two times a day (BID) | ORAL | Status: DC
  Administered 2013-12-26 – 2013-12-27 (×2): 50 mg via ORAL
  Filled 2013-12-26 (×3): qty 1

## 2013-12-26 MED ORDER — SODIUM CHLORIDE 0.9 % IJ SOLN: 3.0000 mL | INTRAMUSCULAR | Status: DC | PRN

## 2013-12-26 MED ORDER — LIDOCAINE HCL (PF) 1 % IJ SOLN
INTRAMUSCULAR | Status: AC
  Filled 2013-12-26: qty 30

## 2013-12-26 MED ORDER — ASPIRIN 81 MG PO CHEW
CHEWABLE_TABLET | ORAL | Status: AC
  Filled 2013-12-26: qty 1

## 2013-12-26 MED ORDER — SIMVASTATIN 40 MG PO TABS
40.0000 mg | ORAL_TABLET | Freq: Every evening | ORAL | Status: DC
  Administered 2013-12-26: 40 mg via ORAL
  Filled 2013-12-26 (×2): qty 1

## 2013-12-26 MED ORDER — HEPARIN SODIUM (PORCINE) 1000 UNIT/ML IJ SOLN
INTRAMUSCULAR | Status: AC
  Filled 2013-12-26: qty 1

## 2013-12-26 MED ORDER — SODIUM CHLORIDE 0.9 % IV SOLN: INTRAVENOUS | Status: DC

## 2013-12-26 MED ORDER — SODIUM CHLORIDE 0.9 % IV SOLN: INTRAVENOUS | Status: AC

## 2013-12-26 MED ORDER — DIAZEPAM 5 MG PO TABS: 5.0000 mg | ORAL_TABLET | ORAL | Status: DC

## 2013-12-26 MED ORDER — MIDAZOLAM HCL 2 MG/2ML IJ SOLN
INTRAMUSCULAR | Status: AC
  Filled 2013-12-26: qty 2

## 2013-12-26 MED ORDER — METOPROLOL TARTRATE 50 MG PO TABS
50.0000 mg | ORAL_TABLET | Freq: Two times a day (BID) | ORAL | Status: DC
  Filled 2013-12-26 (×2): qty 1

## 2013-12-26 MED ORDER — VERAPAMIL HCL 2.5 MG/ML IV SOLN
INTRAVENOUS | Status: AC
  Filled 2013-12-26: qty 2

## 2013-12-26 MED ORDER — CLOPIDOGREL BISULFATE 75 MG PO TABS
75.0000 mg | ORAL_TABLET | Freq: Every day | ORAL | Status: DC
  Administered 2013-12-27: 75 mg via ORAL
  Filled 2013-12-26: qty 1

## 2013-12-26 NOTE — Progress Notes (Signed)
TR BAND REMOVAL  LOCATION:    right radial  DEFLATED PER PROTOCOL:    yes  TIME BAND OFF / DRESSING APPLIED:    1300   SITE UPON ARRIVAL:    Level 0  SITE AFTER BAND REMOVAL:    Level 0  REVERSE ALLEN'S TEST:     positive  CIRCULATION SENSATION AND MOVEMENT:    Within Normal Limits   yes  COMMENTS:   TOLERATED PROCEDURE WELL

## 2013-12-26 NOTE — CV Procedure (Signed)
Cardiac Catheterization Operative Report  Gregory Bates [id number] 2/24/20158:48 AM Gregory Cobble, MD  Procedure Performed:  1. Left Heart Catheterization 2. Selective Coronary Angiography 3. Left ventricular angiogram 4. PTCA/DES x 1 distal RCA  Operator: Lauree Chandler, MD  Arterial access site:  Right radial artery.   Indication: 73 yo WM with history of DM, HTN, hyperlipidemia and CAD with prior stenting in RCA and OM here today for cardiac cath with recent dyspnea and chest pain concerning for unstable angina. Admitted to [facility] 10/02/12 with inferior STEMI and found to have occluded RCA treated with DES. He did well and was discharged on 10/04/12. Readmitted 02/17/13 with NSTEMI. DES placed in severe first OM stenosis. He has diffuse moderate disease in all vessels. Stress myoview 08/07/13 with LVEF-=44%, inferior scar, no ischemia. Admitted to [facility] 11/03/12 with SOB and chest pain. He was found to be in atrial flutter with RVR. He converted to sinus on diltiazem drip. Echo 11/05/12 with normal LV size and function. He was started on Eliquis and discharged on 11/05/12. His metoprolol was increased to 50 mg po BID.                                      Procedure Details: The risks, benefits, complications, treatment options, and expected outcomes were discussed with the patient. The patient and/or family concurred with the proposed plan, giving informed consent. The patient was brought to the cath lab after IV hydration was begun and oral premedication was given. The patient was further sedated with Versed and Fentanyl. The right wrist was assessed with an Allens test which was positive. The right wrist was prepped and draped in a sterile fashion. 1% lidocaine was used for local anesthesia. Using the modified Seldinger access technique, a 5 French sheath was placed in the right radial artery. 3 mg Verapamil was given through the sheath. 8000 units IV heparin was given. Standard  diagnostic catheters were used to perform selective coronary angiography. A pigtail catheter was used to perform a left ventricular angiogram. The patient was found to have moderate disease in all vessels and severe stenosis in the distal RCA which was felt to be the culprit lesion. I elected to proceed to PCI of the RCA.   PCI Note:  I elected to use heparin for the PCI. ACT after 8000 units IV heparin was 253. He was given 2000 units IV heparin. ACT was over 300. RCA engaged with JR4 guide. Cougar IC wire was advanced down the RCA. I was unable to pass the balloon beyond the mid vessel. A [place] IC wire was advanced down the RCA as a buddy wire. I then passed a 2.5 x 12 mm balloon over the Cougar wire into the distal vessel and pre-dilated the stenosis x 1. I then passed a 2.75 x 16 mm Promus Premier DES with some difficulty into the distal vessel and deployed this stent overlapping with the old stent. The stent was post-dilated with a 3.0 x 12 mm [place] balloon x 1. Flow into the distal vessel was excellent. The stenosis was taken from 99% down to 0%.   The sheath was removed from the right radial artery and a Terumo hemostasis band was applied at the arteriotomy site on the right wrist.  There were no immediate complications. The patient was taken to the recovery area in stable condition.   Hemodynamic Findings: Central  aortic pressure: 116/52 Left ventricular pressure: 116/5/13  Angiographic Findings:  Left main: Distal 20% stenosis.   Left Anterior Descending Artery: Moderate caliber vessel that does not reach the apex. The ostium has a 30% stenosis. The mid vessel has diffuse plaque. The first diagonal branch is small to moderate in caliber (2.0 mm) and has tandem 70% stenoses.   Circumflex Artery: Moderate caliber vessel with moderate caliber first obtuse marginal branch. The first OM branch arises early and has a patent stent in the proximal segment. The mid segment of the OM branch has a 50%  stenosis. Distally the OM branch bifurcates and there is complex 50% stenosis involving both vessels beyond the branch point, non-flow limiting and unchanged. The AV groove Circumflex has 60-70% stenosis just after the takeoff of the OM branch but the vessel is small in caliber.   Right Coronary Artery: Large dominant vessel with severe calcification in the proximal and mid vessel. The proximal vessel has a 40% stenosis. The mid vessel has a patent stent. The distal vessel has a 99% stenosis on the proximal edge of the old stent. The remainder of the distal stented segment is patent with minimal restenosis. The posterolateral branch is small in caliber and has a 50% stenosis. The PDA is small to moderate in caliber (2.25 mm vessel) and in the distal segment there is a 60% stenosis followed by a 80% stenosis (unchanged).  Left Ventricular Angiogram: LVEF 40% with inferior wall hypokinesis.   Impression: 1. Triple vessel CAD 2. Severe stenosis distal RCA, culprit for unstable angina 3. Moderate diffuse disease in the small to moderate caliber LAD, small caliber diagonal branch and moderate caliber OM branch 4. Moderate LV systolic dysfunction  Recommendations: Continue medical management of CAD. He will need continued Plavix. Resume Eliquis in am. Follow HR overnight and titrate metoprolol if possible for better HR control and with recent presentation to ED with atrial fib with RVR. Resume Eliquis in am. Hold metformin 48 hours.        Complications:  None. The patient tolerated the procedure well.

## 2013-12-26 NOTE — Interval H&P Note (Signed)
History and Physical Interval Note:  12/26/2013 7:41 AM  Gregory Bates  has presented today for cardiac cath with the diagnosis of cp, shortness of breath  The various methods of treatment have been discussed with the patient and family. After consideration of risks, benefits and other options for treatment, the patient has consented to  Procedure(s): LEFT HEART CATHETERIZATION WITH CORONARY ANGIOGRAM (N/A) as a surgical intervention .  The patient's history has been reviewed, patient examined, no change in status, stable for surgery.  I have reviewed the patient's chart and labs.  Questions were answered to the patient's satisfaction.    Cath Lab Visit (complete for each Cath Lab visit)  Clinical Evaluation Leading to the Procedure:   ACS: no  Non-ACS:    Anginal Classification: CCS III  Anti-ischemic medical therapy: Minimal Therapy (1 class of medications)  Non-Invasive Test Results: No non-invasive testing performed  Prior CABG: No previous CABG        Angie Hogg

## 2013-12-26 NOTE — H&P (View-Only) (Signed)
History of Present Illness: 73 yo WM with history of DM, HTN, hyperlipidemia and CAD with admission to [facility] April 2014 with NSTEMI. He is here today for follow up. Admitted to [facility] 10/02/12 with inferior STEMI and found to have occluded RCA treated with DES. He did well and was discharged on 10/04/12. Readmitted 02/17/13 with NSTEMI. DES placed in severe first OM stenosis. He has diffuse moderate disease in all vessels. Stress myoview 08/07/13 with LVEF-=44%, inferior scar, no ischemia. Admitted to [facility] 11/03/12 with SOB and chest pain. He was found to be in atrial flutter with RVR. He converted to sinus on diltiazem drip. Echo 11/05/12 with normal LV size and function. He was started on Eliquis and discharged on 11/05/12. His metoprolol was increased to 50 mg po BID.   He is here today for follow up. He has been feeling poorly with chest pain and dyspnea with minimal exertion, fatigue. No palpitations, near syncope or syncope.  Primary Care Physician: Linna Darner  Last Lipid Profile:Lipid Panel     Component Value Date/Time   CHOL 170 11/04/2013 0617   TRIG 166* 11/04/2013 0617   HDL 34* 11/04/2013 0617   CHOLHDL 5.0 11/04/2013 0617   VLDL 33 11/04/2013 0617   LDLCALC 103* 11/04/2013 0617     Past Medical History  Diagnosis Date  . CAD (coronary artery disease), native coronary artery     a. s/p cath May 2011 with placement of DES distal RCA;  b. 01/2013 NSTEMI/Cath/PCI: LM 20d, LAD 30ost, D1 70, LCX 83m, OM1 99p (2.75x12 Promus Premier DES), 61m, 50d in branches, RCA Ca2+ prox, 40p, patent stents mid/dist, RPL 50 small, PDA 60/80, EF 45% inf HK.  [name] Kitchen Hypertension   . Hyperlipidemia   . Diabetes mellitus     AODM  . Anemia, unspecified   . Colorectal polyps 2002  . Erectile dysfunction   . Diastolic dysfunction     a. 01/2013 Echo: EF 55%, mid-dist inflat HK, Gr1 DD, Triv AI/TR, Mild MR.  [name] Kitchen Atrial flutter     a. Dx 11/2013. Spont conv. Placed on eliquis.  . Acute renal insufficiency      a. During 11/2013 adm with atrial flutter.    Past Surgical History  Procedure Laterality Date  . Wisdom tooth extraction    . Colonoscopy w/ polypectomy  2002    [facility]  . Infantile paralysis facial asymmetry    . Coronary stent placement  2007, 2011  . Colonoscopy w/ polypectomy  2004; 2007 negative    Current Outpatient Prescriptions  Medication Sig Dispense Refill  . apixaban (ELIQUIS) 5 MG TABS tablet Take 1 tablet (5 mg total) by mouth 2 (two) times daily.  60 tablet  6  . calcium carbonate (OS-CAL) 600 MG TABS tablet Take 600 mg by mouth 2 (two) times daily with a meal.      . cholecalciferol (VITAMIN D) 1000 UNITS tablet Take 1,000 Units by mouth daily.      . clopidogrel (PLAVIX) 75 MG tablet Take 1 tablet (75 mg total) by mouth daily.  90 tablet  3  . fexofenadine (ALLEGRA) 180 MG tablet Take 180 mg by mouth daily.        [name] Kitchen lisinopril (PRINIVIL,ZESTRIL) 10 MG tablet Take 1 tablet (10 mg total) by mouth daily.  90 tablet  3  . metFORMIN (GLUCOPHAGE) 500 MG tablet Take 500 mg by mouth 2 (two) times daily with a meal.      . metoprolol  tartrate (LOPRESSOR) 50 MG tablet Take 1 tablet (50 mg total) by mouth 2 (two) times daily.  60 tablet  6  . Multiple Vitamin (MULTIVITAMIN) tablet Take 1 tablet by mouth daily.        . nitroGLYCERIN (NITROSTAT) 0.4 MG SL tablet Place 1 tablet (0.4 mg total) under the tongue every 5 (five) minutes as needed for chest pain.  25 tablet  6  . simvastatin (ZOCOR) 40 MG tablet Take 1 tablet (40 mg total) by mouth at bedtime.  90 tablet  3  . vitamin E 100 UNIT capsule Take 100 Units by mouth daily.       No current facility-administered medications for this visit.    Allergies  Allergen Reactions  . Brilinta [Ticagrelor]     Arthralgias & myalgias  . Crestor [Rosuvastatin]     Myalgias (takes Zocor)  . Lipitor [Atorvastatin]     Myalgias (takes Zocor)    History   Social History  . Marital Status: Married    Spouse Name: N/A     Number of Children: 1  . Years of Education: N/A   Occupational History  . maintenance     full time   Social History Main Topics  . Smoking status: Former Smoker    Quit date: 11/02/1984  . Smokeless tobacco: Never Used     Comment: smoked [place], up to 3 ppd (!)  . Alcohol Use: No  . Drug Use: No  . Sexual Activity: Not on file   Other Topics Concern  . Not on file   Social History Narrative  . No narrative on file    Family History  Problem Relation Age of Onset  . Heart attack Brother 88  . Colon cancer Brother   . Diabetes Neg Hx   . Stroke Neg Hx   . Hypertension Neg Hx     Review of Systems:  As stated in the HPI and otherwise negative.   BP 150/68  Pulse 75  Ht 6\' 1"  (1.854 m)  Wt 201 lb (91.173 kg)  BMI 26.52 kg/m2  Physical Examination: General: Well developed, well nourished, NAD HEENT: OP clear, mucus membranes moist SKIN: warm, dry. No rashes. Neuro: No focal deficits Musculoskeletal: Muscle strength 5/5 all ext Psychiatric: Mood and affect normal Neck: No JVD, no carotid bruits, no thyromegaly, no lymphadenopathy. Lungs:Clear bilaterally, no wheezes, rhonci, crackles Cardiovascular: Regular rate and rhythm. No murmurs, gallops or rubs. Abdomen:Soft. Bowel sounds present. Non-tender.  Extremities: No lower extremity edema. Pulses are 2 + in the bilateral DP/PT.  Cardiac cath 02/20/13: Left main: Distal 20% stenosis.  Left Anterior Descending Artery: Moderate caliber vessel that does not reach the apex. The ostium has a 30% stenosis. The mid vessel has diffuse plaque. The first diagonal branch is small to moderate in caliber (2.0 mm) and has tandem 70% stenoses.  Circumflex Artery: Large caliber vessel with large first obtuse marginal branch. The first OM branch arises early and has a 99% stenosis in the proximal segment. The mid segment of the OM branch has a 50% stenosis. Distally the OM branch bifurcates and there is complex 50% stenosis  involving both vessels beyond the branch point, non-flow limiting. The AV groove Circumflex has 70% stenosis just after the takeoff of the OM branch but the vessel is small in caliber.  Right Coronary Artery: Large dominant vessel with severe calcification in the proximal and mid vessel. The proximal vessel has a 40% stenosis. The mid vessel has a  patent stent. The distal stented segment is patent with minimal restenosis. The posterolateral branch is small in caliber and has a 50% stenosis. The PDA is moderate in caliber (2.25 mm vessel) and in the distal segment there is a 60% stenosis followed by a 80% stenosis.  Left Ventricular Angiogram: LVEF 45% with inferior wall hypokinesis.   Stress myoview 08/07/13:  Stress Procedure: The patient exercised on the treadmill utilizing the [place] Protocol for 6:01 minutes. The patient stopped due to fatigue and denied any chest pain. There were occasional PVC's and rare PAC's. Technetium 57m Sestamibi was injected at peak exercise and myocardial perfusion imaging was performed after a brief delay.  Stress ECG: Significant ST abnormalities consistent with ischemia.  QPS  Raw Data Images: Acquisition technically good; normal left ventricular size.  Stress Images: There is decreased uptake in the inferior wall.  Rest Images: There is decreased uptake in the inferior wall.  Subtraction (SDS): There is a fixed defect that is most consistent with a previous infarction.  Transient Ischemic Dilatation (Normal <1.22): n/a  Lung/Heart Ratio (Normal <0.45): 0.38  Quantitative Gated Spect Images  QGS EDV: 119 ml  QGS ESV: 67 ml  Impression  Exercise Capacity: Fair exercise capacity.  BP Response: Hypertensive blood pressure response.  Clinical Symptoms: There is dyspnea.  ECG Impression: Significant ST abnormalities consistent with ischemia.  Comparison with Prior Nuclear Study: No images to compare  Overall Impression: Low risk stress nuclear study with a large,  severe, fixed inferior defect consistent with prior infarct; no ischemia.  LV Ejection Fraction: 44%. LV Wall Motion: Inferior akinesis.  Echo 11/05/13: Left ventricle: The cavity size was at the upper limits of normal. Wall thickness was increased in a pattern of mild LVH. The estimated ejection fraction was 55%. There is hypokinesis of the basalinferolateral myocardium. Doppler parameters are consistent with abnormal left ventricular relaxation (grade 1 diastolic dysfunction). - Aortic valve: Mild regurgitation. - Mitral valve: Trivial regurgitation. - Left atrium: The atrium was moderately dilated. - Right atrium: Central venous pressure: 58mm Hg (est). - Atrial septum: No defect or patent foramen ovale was identified. - Tricuspid valve: Trivial regurgitation. - Pulmonary arteries: PA peak pressure: 4mm Hg (S). - Pericardium, extracardiac: There was no pericardial effusion  Assessment and Plan:   1. CAD: Recent chest pain and dyspnea with exertion.  This is concerning for unstable angina. He has undergone DES placement in the RCA in setting of inferior STEMI in December 2013 and repeat catheterization 02/20/13 in setting of NSTEMI at which time a DES was placed in the first OM branch. He is now on Plavix as single antiplatelet therapy given need for chronic anti-coagulation. Will arrange cardiac cath 12/26/13 with possible PCI. Risks and benefits reviewed today. Pre-cath labs today.   2. HTN: BP is elevated today. Will increase Lisinopril to 20 mg po Qdaily.   3. Hyperlipidemia: He is on a statin. Will continue Zocor 20 mg po QHS.   4. Atrial flutter: Sinus today. Will continue Eliquis for anti-coagulation but will hold Sunday pre-cath. Metoprolol for rate and rhythm control.

## 2013-12-27 DIAGNOSIS — I2510 Atherosclerotic heart disease of native coronary artery without angina pectoris: Secondary | ICD-10-CM

## 2013-12-27 DIAGNOSIS — I200 Unstable angina: Secondary | ICD-10-CM

## 2013-12-27 LAB — CBC
HCT: 38.8 % — ABNORMAL LOW (ref 39.0–52.0)
HEMOGLOBIN: 13 g/dL (ref 13.0–17.0)
MCH: 29.6 pg (ref 26.0–34.0)
MCHC: 33.5 g/dL (ref 30.0–36.0)
MCV: 88.4 fL (ref 78.0–100.0)
Platelets: 179 10*3/uL (ref 150–400)
RBC: 4.39 MIL/uL (ref 4.22–5.81)
RDW: 12.7 % (ref 11.5–15.5)
WBC: 8 10*3/uL (ref 4.0–10.5)

## 2013-12-27 LAB — GLUCOSE, CAPILLARY: Glucose-Capillary: 125 mg/dL — ABNORMAL HIGH (ref 70–99)

## 2013-12-27 LAB — BASIC METABOLIC PANEL
BUN: 19 mg/dL (ref 6–23)
CHLORIDE: 103 meq/L (ref 96–112)
CO2: 25 mEq/L (ref 19–32)
Calcium: 9.4 mg/dL (ref 8.4–10.5)
Creatinine, Ser: 1.37 mg/dL — ABNORMAL HIGH (ref 0.50–1.35)
GFR calc non Af Amer: 50 mL/min — ABNORMAL LOW (ref >90)
GFR, EST AFRICAN AMERICAN: 57 mL/min — AB (ref >90)
Glucose, Bld: 141 mg/dL — ABNORMAL HIGH (ref 70–99)
POTASSIUM: 4.8 meq/L (ref 3.7–5.3)
Sodium: 141 mEq/L (ref 137–147)

## 2013-12-27 NOTE — Progress Notes (Signed)
Subjective: Denies resting CP and SOB. Hasn't ambulated yet.   Objective: Vital signs in last 24 hours: Temp:  [97.7 F (36.5 C)-98.5 F (36.9 C)] 98.1 F (36.7 C) (02/25 0428) Pulse Rate:  [56-72] 71 (02/25 0428) Resp:  [16-18] 17 (02/25 0428) BP: (97-137)/(44-59) 137/57 mmHg (02/25 0428) SpO2:  [96 %-99 %] 99 % (02/25 0428) Weight:  [185 lb 6.5 oz (84.1 kg)] 185 lb 6.5 oz (84.1 kg) (02/25 0102) Last BM Date: 12/25/13  Intake/Output from previous day: 02/24 0701 - 02/25 0700 In: 1732.5 [P.O.:1320; I.V.:412.5] Out: 2 [Stool:2] Intake/Output this shift: Total I/O In: 480 [P.O.:480] Out: -   Medications Current Facility-Administered Medications  Medication Dose Route Frequency Provider Last Rate Last Dose  . aspirin chewable tablet 81 mg  81 mg Oral Daily Burnell Blanks, MD      . clopidogrel (PLAVIX) tablet 75 mg  75 mg Oral Q breakfast Burnell Blanks, MD      . lisinopril (PRINIVIL,ZESTRIL) tablet 20 mg  20 mg Oral Daily Burnell Blanks, MD      . loratadine (CLARITIN) tablet 10 mg  10 mg Oral Daily Burnell Blanks, MD      . metoprolol (LOPRESSOR) tablet 50 mg  50 mg Oral BID Arty Baumgartner, RPH   50 mg at 12/26/13 2125  . nitroGLYCERIN (NITROSTAT) SL tablet 0.4 mg  0.4 mg Sublingual Q5 min PRN Burnell Blanks, MD      . simvastatin (ZOCOR) tablet 40 mg  40 mg Oral QPM Burnell Blanks, MD   40 mg at 12/26/13 1835    PE: General appearance: alert, cooperative and no distress Lungs: clear to auscultation bilaterally Heart: regular rate and rhythm Extremities: no LEE Pulses: 2+ and symmetric Skin: warm and dry Neurologic: Grossly normal  Lab Results:   Recent Labs  12/24/13 2231 12/27/13 0443  WBC 9.0 8.0  HGB 13.2 13.0  HCT 39.1 38.8*  PLT 181 179   BMET  Recent Labs  12/24/13 2231 12/27/13 0443  NA 142 141  K 4.4 4.8  CL 102 103  CO2 27 25  GLUCOSE 140* 141*  BUN 24* 19  CREATININE 1.32 1.37*    CALCIUM 9.5 9.4   Studies/Results:  LHC 12/26/13 Impression:  1. Triple vessel CAD  2. Severe stenosis distal RCA, culprit for unstable angina  3. Moderate diffuse disease in the small to moderate caliber LAD, small caliber diagonal branch and moderate caliber OM branch  4. Moderate LV systolic dysfunction; EF 42% with inferior wall hypokinesis   Assessment/Plan  Active Problems:   Unstable angina  Plan: Day 1 s/p diagnostic LHC revealing triple vessel CAD, with severe stenosis (99%) in the distal RCA, treated with a DES, moderate diffuse disease in the small to moderate caliber LAD, small caliber diagonal branch and moderate caliber OM branch. He has moderate LV systolic dysfunction with an EF of 40% with inferior wall hypokinesis. Continued medical therapy was recommended. Resume Plavix, as well as BB, ACE-I and statin. His Lopressor was increased to 50 mg BID for better rate control (has PAF). He is currently in NSR. His HR in the upper 60s-70s. BP is stable, as is renal function. Continue to hold Metformin today. Right radial access site is stable. Will resume Eliquis. Will have patient ambulate with cardiac rehab. If no further issues, will plan for discharge home today. MD to follow.     LOS: 1 day    Brittainy M. Rosita Fire, PA-C  12/27/2013 6:24 AM  .I have personally seen and examined this patient with Lyda Jester, PA-C I agree with the assessment and plan as outlined above. Pt is s/p PCI and DES of RCA yesterday. He can go home today. Continue ASA and Plavix. He would like to consider rehab. F/U with me in 3-4 weeks.   MCALHANY,CHRISTOPHER 12/27/2013 8:01 AM

## 2013-12-27 NOTE — Discharge Summary (Signed)
Physician Discharge Summary  Patient ID: Gregory Bates MRN: [id number] DOB/AGE: 04-30-1941 73 y.o.  Admit date: 12/26/2013 Discharge date: 12/27/2013  Admission Diagnoses: Unstable Angina  Discharge Diagnoses:  Active Problems:   Unstable angina   Discharged Condition: stable  Hospital Course: The patient is a 73 y/o WM, followed by Dr. Julianne Handler, with a history of DM, HTN, hyperlipidemia and CAD with prior stenting in RCA and OM who presented to [facility] on 12/26/13 for a cardiac cath, in the setting of  recent dyspnea and chest pain concerning for unstable angina. His past cardiac history is as follows: Admitted to [facility] 10/02/12 with inferior STEMI and found to have occluded RCA treated with DES. He did well and was discharged on 10/04/12. Readmitted 02/17/13 with NSTEMI. DES placed in severe first OM stenosis. He has diffuse moderate disease in all vessels. Stress myoview 08/07/13 with LVEF-=44%, inferior scar, no ischemia. Admitted to [facility] 11/03/12 with SOB and chest pain. He was found to be in atrial flutter with RVR. He converted to sinus on diltiazem drip. Echo 11/05/12 with normal LV size and function. He was started on Eliquis and discharged on 11/05/12. His metoprolol was increased to 50 mg po BID.  The planned elective cardiac cath was performed by Dr. Julianne Handler, via the right radial artery. He was found to have triple vessel CAD, with severe stenosis (99%) in the distal RCA, treated with a DES, moderate diffuse disease in the small to moderate caliber LAD, small caliber diagonal branch and moderate caliber OM branch. He had moderate LV systolic dysfunction with an EF of 40% with inferior wall hypokinesis. He left the cath lab in stable condition. He was continued on medical therapy with Plavix,  Lopressor, simvastatin and lisinopril. His Lopressor was increased to 50 mg BID for better rate control. He tolerated the adjustment without difficulty. He had no post cath complications. He had no  recurrent CP. He ambulated with cardiac rehab w/o difficulty. The right radial acces site remained stable. Renal function, HR and BP were all stable. On post cath day 1, he was continued on Eliquis. He was last seen and examined by Dr. Julianne Handler, who determined he was stable for discharge home. He was instructed to wait an additional 24 hrs before resuming his Metformin.    Consults: None  Significant Diagnostic Studies:   LHC 12/26/13 Hemodynamic Findings:  Central aortic pressure: 116/52  Left ventricular pressure: 116/5/13  Angiographic Findings:  Left main: Distal 20% stenosis.  Left Anterior Descending Artery: Moderate caliber vessel that does not reach the apex. The ostium has a 30% stenosis. The mid vessel has diffuse plaque. The first diagonal branch is small to moderate in caliber (2.0 mm) and has tandem 70% stenoses.  Circumflex Artery: Moderate caliber vessel with moderate caliber first obtuse marginal branch. The first OM branch arises early and has a patent stent in the proximal segment. The mid segment of the OM branch has a 50% stenosis. Distally the OM branch bifurcates and there is complex 50% stenosis involving both vessels beyond the branch point, non-flow limiting and unchanged. The AV groove Circumflex has 60-70% stenosis just after the takeoff of the OM branch but the vessel is small in caliber.  Right Coronary Artery: Large dominant vessel with severe calcification in the proximal and mid vessel. The proximal vessel has a 40% stenosis. The mid vessel has a patent stent. The distal vessel has a 99% stenosis on the proximal edge of the old stent. The remainder of the distal  stented segment is patent with minimal restenosis. The posterolateral branch is small in caliber and has a 50% stenosis. The PDA is small to moderate in caliber (2.25 mm vessel) and in the distal segment there is a 60% stenosis followed by a 80% stenosis (unchanged).  Left Ventricular Angiogram: LVEF 40% with  inferior wall hypokinesis.  Impression:  1. Triple vessel CAD  2. Severe stenosis distal RCA, culprit for unstable angina  3. Moderate diffuse disease in the small to moderate caliber LAD, small caliber diagonal branch and moderate caliber OM branch  4. Moderate LV systolic dysfunction   Treatments: See Hospital Course  Discharge Exam: Blood pressure 142/53, pulse 73, temperature 98.1 F (36.7 C), temperature source Oral, resp. rate 18, height 5\' 11"  (1.803 m), weight 185 lb 6.5 oz (84.1 kg), SpO2 99.00%.   Disposition: 01-Home or Self Care      Discharge Orders   Future Appointments Provider Department Dept Phone   02/02/2014 3:45 PM Burnell Blanks, MD [place] Office [phone]   Future Orders Complete By Expires   Amb Referral to Cardiac Rehabilitation  As directed    Diet - low sodium heart healthy  As directed    Diet - low sodium heart healthy  As directed    Discharge instructions  As directed    Comments:     Wait until Friday 2/27 to resume Metformin   Driving Restrictions  As directed    Comments:     No driving for 3 days   Increase activity slowly  As directed    Increase activity slowly  As directed    Lifting restrictions  As directed    Comments:     No lifting more than 1/2 gallon of milk for 3 days       Medication List         calcium carbonate 600 MG Tabs tablet  Commonly known as:  OS-CAL  Take 600 mg by mouth 2 (two) times daily with a meal.     cholecalciferol 1000 UNITS tablet  Commonly known as:  VITAMIN D  Take 1,000 Units by mouth daily.     clopidogrel 75 MG tablet  Commonly known as:  PLAVIX  Take 75 mg by mouth daily with breakfast.     ELIQUIS 5 MG Tabs tablet  Generic drug:  apixaban  Take 5 mg by mouth 2 (two) times daily.     fexofenadine 180 MG tablet  Commonly known as:  ALLEGRA  Take 180 mg by mouth daily.     lisinopril 20 MG tablet  Commonly known as:  PRINIVIL,ZESTRIL  Take 20 mg by mouth  daily.     metFORMIN 500 MG tablet  Commonly known as:  GLUCOPHAGE  Take 500 mg by mouth 2 (two) times daily with a meal.     metoprolol 50 MG tablet  Commonly known as:  LOPRESSOR  Take 50 mg by mouth 2 (two) times daily.     multivitamin tablet  Take 1 tablet by mouth daily.     nitroGLYCERIN 0.4 MG SL tablet  Commonly known as:  NITROSTAT  Place 0.4 mg under the tongue every 5 (five) minutes as needed for chest pain.     simvastatin 40 MG tablet  Commonly known as:  ZOCOR  Take 40 mg by mouth every evening.     vitamin E 100 UNIT capsule  Take 100 Units by mouth daily.       Follow-up Information  Follow up with MCALHANY,CHRISTOPHER, MD In 4 weeks. (call to arrange a follow-up appointment with Dr. Julianne Handler)    Specialty:  Cardiology   Contact information:   [address] [place] [address] [phone]      TIME SPENT ON DISCHARGE, INCLUDING PHYSICIAN TIME: >30 MINUTES Signed: Lyda Jester 12/27/2013, 10:25 AM

## 2013-12-27 NOTE — Progress Notes (Signed)
CARDIAC REHAB PHASE I   PRE:  Rate/Rhythm: 70SR  BP:  Supine:   Sitting: 142/53  Standing:    SaO2:   MODE:  Ambulation: 1000 ft   POST:  Rate/Rhythm: 78SR  BP:  Supine:   Sitting: 123/64  Standing:    SaO2:  0815-0900 Pt walked 1000 ft on RA with steady gait. Tolerated well. No CP. Education reviewed as I have seen pt on previous admissions. Left heart healthy and diabetic diets. Discussed CRP 2 and pt gave permission to refer to [facility].    Graylon Good, RN BSN  12/27/2013 8:52 AM

## 2013-12-27 NOTE — Discharge Summary (Signed)
See full note this am. cdm 

## 2014-02-02 ENCOUNTER — Ambulatory Visit: Payer: BC Managed Care – PPO | Admitting: Cardiovascular Disease

## 2014-03-07 ENCOUNTER — Ambulatory Visit (INDEPENDENT_AMBULATORY_CARE_PROVIDER_SITE_OTHER): Payer: BC Managed Care – PPO | Admitting: Cardiovascular Disease

## 2014-03-07 ENCOUNTER — Encounter: Payer: Self-pay | Admitting: Cardiovascular Disease

## 2014-03-07 VITALS — BP 142/68 | HR 69 | Ht 71.0 in

## 2014-03-07 DIAGNOSIS — I2510 Atherosclerotic heart disease of native coronary artery without angina pectoris: Secondary | ICD-10-CM

## 2014-03-07 DIAGNOSIS — I10 Essential (primary) hypertension: Secondary | ICD-10-CM

## 2014-03-07 DIAGNOSIS — E785 Hyperlipidemia, unspecified: Secondary | ICD-10-CM

## 2014-03-07 DIAGNOSIS — I4891 Unspecified atrial fibrillation: Secondary | ICD-10-CM

## 2014-03-07 MED ORDER — METFORMIN HCL 500 MG PO TABS
500.0000 mg | ORAL_TABLET | Freq: Two times a day (BID) | ORAL | Status: DC
Start: 2014-03-07 — End: 2014-11-28

## 2014-03-07 MED ORDER — SIMVASTATIN 40 MG PO TABS: 40.0000 mg | ORAL_TABLET | Freq: Every evening | ORAL | Status: DC

## 2014-03-07 MED ORDER — METOPROLOL TARTRATE 50 MG PO TABS: 50.0000 mg | ORAL_TABLET | Freq: Two times a day (BID) | ORAL | Status: DC

## 2014-03-07 MED ORDER — CLOPIDOGREL BISULFATE 75 MG PO TABS: 75.0000 mg | ORAL_TABLET | Freq: Every day | ORAL | Status: DC

## 2014-03-07 MED ORDER — LISINOPRIL 20 MG PO TABS: 20.0000 mg | ORAL_TABLET | Freq: Every day | ORAL | Status: DC

## 2014-03-07 NOTE — Patient Instructions (Signed)
Your physician wants you to follow-up in:  6 months. You will receive a reminder letter in the mail two months in advance. If you don't receive a letter, please call our office to schedule the follow-up appointment.   

## 2014-03-07 NOTE — Progress Notes (Signed)
History of Present Illness: 73 yo WM with history of DM, HTN, hyperlipidemia and CAD with admission to [facility] April 2014 with NSTEMI. He is here today for follow up. Admitted to [facility] 10/02/12 with inferior STEMI and found to have occluded RCA treated with DES. He did well and was discharged on 10/04/12. Readmitted 02/17/13 with NSTEMI. DES placed in severe first OM stenosis. He has diffuse moderate disease in all vessels. Stress myoview 08/07/13 with LVEF-=44%, inferior scar, no ischemia. Admitted to [facility] 11/03/12 with SOB and chest pain. He was found to be in atrial flutter with RVR. He converted to sinus on diltiazem drip. Echo 11/05/12 with normal LV size and function. He was started on Eliquis and discharged on 11/05/12. His metoprolol was increased to 50 mg po BID. I saw him February 2015 and he had c/o CP and fatigue, dyspnea. I arranged a cardiac cath on 12/26/13 and he was found to have high grade stenosis in the distal RCA. A 2.75 x 16 mm Promus Premier DES was placed in the distal RCA.   He is here today for follow up. He is feeling great. No chest pain, dyspnea, fatigue. No palpitations, near syncope or syncope.  Primary Care Physician: Linna Darner  Last Lipid Profile:Lipid Panel     Component Value Date/Time   CHOL 170 11/04/2013 0617   TRIG 166* 11/04/2013 0617   HDL 34* 11/04/2013 0617   CHOLHDL 5.0 11/04/2013 0617   VLDL 33 11/04/2013 0617   LDLCALC 103* 11/04/2013 0617     Past Medical History  Diagnosis Date  . CAD (coronary artery disease), native coronary artery     a. s/p cath May 2011 with placement of DES distal RCA;  b. 01/2013 NSTEMI/Cath/PCI: LM 20d, LAD 30ost, D1 70, LCX 12m, OM1 99p (2.75x12 Promus Premier DES), 11m, 50d in branches, RCA Ca2+ prox, 40p, patent stents mid/dist, RPL 50 small, PDA 60/80, EF 45% inf HK.  [name] Kitchen Hypertension   . Hyperlipidemia   . Diabetes mellitus     AODM  . Anemia, unspecified   . Colorectal polyps 2002  . Erectile dysfunction   .  Diastolic dysfunction     a. 01/2013 Echo: EF 55%, mid-dist inflat HK, Gr1 DD, Triv AI/TR, Mild MR.  [name] Kitchen Atrial flutter     a. Dx 11/2013. Spont conv. Placed on eliquis.  . Acute renal insufficiency     a. During 11/2013 adm with atrial flutter.  . Myocardial infarction   . Anginal pain   . Dysrhythmia     HX OF ATRIAL FIBRILATION  . Shortness of breath     Past Surgical History  Procedure Laterality Date  . Wisdom tooth extraction    . Colonoscopy w/ polypectomy  2002    [facility]  . Infantile paralysis facial asymmetry    . Coronary stent placement  2007, 2011  . Colonoscopy w/ polypectomy  2004; 2007 negative  . Coronary angioplasty  12/26/2013    Current Outpatient Prescriptions  Medication Sig Dispense Refill  . apixaban (ELIQUIS) 5 MG TABS tablet Take 5 mg by mouth 2 (two) times daily.      . calcium carbonate (OS-CAL) 600 MG TABS tablet Take 600 mg by mouth 2 (two) times daily with a meal.      . cholecalciferol (VITAMIN D) 1000 UNITS tablet Take 1,000 Units by mouth daily.      . clopidogrel (PLAVIX) 75 MG tablet Take 75 mg by mouth daily with breakfast.      .  fexofenadine (ALLEGRA) 180 MG tablet Take 180 mg by mouth daily.        [name] Kitchen lisinopril (PRINIVIL,ZESTRIL) 20 MG tablet Take 20 mg by mouth daily.      . metFORMIN (GLUCOPHAGE) 500 MG tablet Take 500 mg by mouth 2 (two) times daily with a meal.      . metoprolol (LOPRESSOR) 50 MG tablet Take 50 mg by mouth 2 (two) times daily.      . Multiple Vitamin (MULTIVITAMIN) tablet Take 1 tablet by mouth daily.        . nitroGLYCERIN (NITROSTAT) 0.4 MG SL tablet Place 0.4 mg under the tongue every 5 (five) minutes as needed for chest pain.      . simvastatin (ZOCOR) 40 MG tablet Take 40 mg by mouth every evening.      . vitamin E 100 UNIT capsule Take 100 Units by mouth daily.       No current facility-administered medications for this visit.    Allergies  Allergen Reactions  . Brilinta [Ticagrelor]     Arthralgias &  myalgias  . Crestor [Rosuvastatin]     Myalgias (takes Zocor)  . Lipitor [Atorvastatin]     Myalgias (takes Zocor)    History   Social History  . Marital Status: Married    Spouse Name: N/A    Number of Children: 1  . Years of Education: N/A   Occupational History  . maintenance     full time   Social History Main Topics  . Smoking status: Former Smoker    Quit date: 11/02/1984  . Smokeless tobacco: Never Used     Comment: smoked [place], up to 3 ppd (!)  . Alcohol Use: No  . Drug Use: No  . Sexual Activity: Not on file   Other Topics Concern  . Not on file   Social History Narrative  . No narrative on file    Family History  Problem Relation Age of Onset  . Heart attack Brother 51  . Colon cancer Brother   . Diabetes Neg Hx   . Stroke Neg Hx   . Hypertension Neg Hx     Review of Systems:  As stated in the HPI and otherwise negative.   BP 142/68  Pulse 69  Ht 5\' 11"  (1.803 m)  Physical Examination: General: Well developed, well nourished, NAD HEENT: OP clear, mucus membranes moist SKIN: warm, dry. No rashes. Neuro: No focal deficits Musculoskeletal: Muscle strength 5/5 all ext Psychiatric: Mood and affect normal Neck: No JVD, no carotid bruits, no thyromegaly, no lymphadenopathy. Lungs:Clear bilaterally, no wheezes, rhonci, crackles Cardiovascular: Regular rate and rhythm. No murmurs, gallops or rubs. Abdomen:Soft. Bowel sounds present. Non-tender.  Extremities: No lower extremity edema. Pulses are 2 + in the bilateral DP/PT.  Cardiac cath 12/26/13: Left main: Distal 20% stenosis.  Left Anterior Descending Artery: Moderate caliber vessel that does not reach the apex. The ostium has a 30% stenosis. The mid vessel has diffuse plaque. The first diagonal branch is small to moderate in caliber (2.0 mm) and has tandem 70% stenoses.  Circumflex Artery: Moderate caliber vessel with moderate caliber first obtuse marginal branch. The first OM branch arises  early and has a patent stent in the proximal segment. The mid segment of the OM branch has a 50% stenosis. Distally the OM branch bifurcates and there is complex 50% stenosis involving both vessels beyond the branch point, non-flow limiting and unchanged. The AV groove Circumflex has 60-70% stenosis just after the  takeoff of the OM branch but the vessel is small in caliber.  Right Coronary Artery: Large dominant vessel with severe calcification in the proximal and mid vessel. The proximal vessel has a 40% stenosis. The mid vessel has a patent stent. The distal vessel has a 99% stenosis on the proximal edge of the old stent. The remainder of the distal stented segment is patent with minimal restenosis. The posterolateral branch is small in caliber and has a 50% stenosis. The PDA is small to moderate in caliber (2.25 mm vessel) and in the distal segment there is a 60% stenosis followed by a 80% stenosis (unchanged).  Left Ventricular Angiogram: LVEF 40% with inferior wall hypokinesis.   Echo 11/05/13: Left ventricle: The cavity size was at the upper limits of normal. Wall thickness was increased in a pattern of mild LVH. The estimated ejection fraction was 55%. There is hypokinesis of the basalinferolateral myocardium. Doppler parameters are consistent with abnormal left ventricular relaxation (grade 1 diastolic dysfunction). - Aortic valve: Mild regurgitation. - Mitral valve: Trivial regurgitation. - Left atrium: The atrium was moderately dilated. - Right atrium: Central venous pressure: 49mm Hg (est). - Atrial septum: No defect or patent foramen ovale was identified. - Tricuspid valve: Trivial regurgitation. - Pulmonary arteries: PA peak pressure: 50mm Hg (S). - Pericardium, extracardiac: There was no pericardial Effusion  EKG: NSR, rate 69 bpm. Old inferior infarct  Assessment and Plan:   1. CAD: Stable post cath and PCI. Continue Plavix. No ASA with use of Eliquis.   2. HTN: BP is  controlled. No changes.    3. Hyperlipidemia: He is on a statin. Will continue Zocor 20 mg po QHS.   4. Atrial flutter: Sinus today. Will continue Eliquis for anti-coagulation. Metoprolol for rate and rhythm control.

## 2014-04-02 ENCOUNTER — Other Ambulatory Visit: Payer: Self-pay | Admitting: Cardiovascular Disease

## 2014-06-18 ENCOUNTER — Encounter: Payer: Self-pay | Admitting: Cardiovascular Disease

## 2014-06-21 ENCOUNTER — Other Ambulatory Visit: Payer: Self-pay | Admitting: *Deleted

## 2014-06-21 MED ORDER — APIXABAN 5 MG PO TABS: 5.0000 mg | ORAL_TABLET | Freq: Two times a day (BID) | ORAL | Status: DC

## 2014-08-16 ENCOUNTER — Telehealth: Payer: Self-pay | Admitting: Cardiovascular Disease

## 2014-08-16 NOTE — Telephone Encounter (Signed)
I placed call to return number listed for office. Office closed. Will follow up tomorrow.

## 2014-08-16 NOTE — Telephone Encounter (Signed)
New problem   Need to know if pt can come off Plavix to have a circumcision. Please advise.

## 2014-08-17 NOTE — Telephone Encounter (Signed)
Follow up   Gregory Bates calling back to speak with nurse.

## 2014-08-17 NOTE — Telephone Encounter (Signed)
Left message on Debra's identified voicemail to call office

## 2014-08-17 NOTE — Telephone Encounter (Signed)
Left message to call back  

## 2014-08-23 NOTE — Telephone Encounter (Signed)
Spoke with Hilda Blades at Dr. Sheila Oats office. Pt is having problems with foreskin and Dr. Donnamarie Poag would like to schedule pt for circumcision.  Per Hilda Blades this is not an emergency situation. They are asking if pt can stop Plavix and Eliquis prior to procedure.  I told Hilda Blades pt had DES in February 2015 and Dr. Angelena Form wants pt to continue Plavix without interruption for one year unless it is an emergency.  Hilda Blades will fax information to our office. Will forward to Dr. Angelena Form for review

## 2014-08-24 NOTE — Telephone Encounter (Signed)
Continue Plavix at least through February 2016. cdm

## 2014-08-24 NOTE — Telephone Encounter (Signed)
Spoke with Hilda Blades at Dr. Sheila Oats office and gave her information from Dr. Angelena Form.

## 2014-09-20 ENCOUNTER — Encounter: Payer: Self-pay | Admitting: Cardiovascular Disease

## 2014-09-20 ENCOUNTER — Ambulatory Visit (INDEPENDENT_AMBULATORY_CARE_PROVIDER_SITE_OTHER): Payer: BC Managed Care – PPO | Admitting: Cardiovascular Disease

## 2014-09-20 VITALS — BP 130/60 | HR 73 | Ht 71.0 in | Wt 193.0 lb

## 2014-09-20 DIAGNOSIS — E785 Hyperlipidemia, unspecified: Secondary | ICD-10-CM

## 2014-09-20 DIAGNOSIS — I480 Paroxysmal atrial fibrillation: Secondary | ICD-10-CM

## 2014-09-20 DIAGNOSIS — I10 Essential (primary) hypertension: Secondary | ICD-10-CM

## 2014-09-20 DIAGNOSIS — I2510 Atherosclerotic heart disease of native coronary artery without angina pectoris: Secondary | ICD-10-CM

## 2014-09-20 MED ORDER — APIXABAN 5 MG PO TABS: 5.0000 mg | ORAL_TABLET | Freq: Two times a day (BID) | ORAL | Status: DC

## 2014-09-20 MED ORDER — POTASSIUM CHLORIDE CRYS ER 20 MEQ PO TBCR: 20.0000 meq | EXTENDED_RELEASE_TABLET | Freq: Every day | ORAL | Status: DC

## 2014-09-20 NOTE — Progress Notes (Signed)
History of Present Illness: 73 yo WM with history of DM, HTN, hyperlipidemia and CAD with admission to [facility] April 2014 with NSTEMI. He is here today for follow up. Admitted to [facility] 10/02/12 with inferior STEMI and found to have occluded RCA treated with DES. He did well and was discharged on 10/04/12. Readmitted 02/17/13 with NSTEMI. DES placed in severe first OM stenosis. He has diffuse moderate disease in all vessels. Stress myoview 08/07/13 with LVEF-=44%, inferior scar, no ischemia. Admitted to [facility] 11/03/12 with SOB and chest pain. He was found to be in atrial flutter with RVR. He converted to sinus on diltiazem drip. Echo 11/05/12 with normal LV size and function. He was started on Eliquis and discharged on 11/05/12. His metoprolol was increased to 50 mg po BID. I saw him February 2015 and he had c/o CP and fatigue, dyspnea. I arranged a cardiac cath on 12/26/13 and he was found to have high grade stenosis in the distal RCA. A 2.75 x 16 mm Promus Premier DES was placed in the distal RCA.   He is here today for follow up. He is feeling great. No chest pain, dyspnea, fatigue. No palpitations, near syncope or syncope. He is very active. He is needing to have a procedure per Urology.   Primary Care Physician: [facility], NP) Urology: Joie Bimler, MD  Last Lipid Profile:Lipid Panel     Component Value Date/Time   CHOL 170 11/04/2013 0617   TRIG 166* 11/04/2013 0617   HDL 34* 11/04/2013 0617   CHOLHDL 5.0 11/04/2013 0617   VLDL 33 11/04/2013 0617   LDLCALC 103* 11/04/2013 0617    Past Medical History  Diagnosis Date  . CAD (coronary artery disease), native coronary artery     a. s/p cath May 2011 with placement of DES distal RCA;  b. 01/2013 NSTEMI/Cath/PCI: LM 20d, LAD 30ost, D1 70, LCX 37m, OM1 99p (2.75x12 Promus Premier DES), 41m, 50d in branches, RCA Ca2+ prox, 40p, patent stents mid/dist, RPL 50 small, PDA 60/80, EF 45% inf HK.  [name] Kitchen Hypertension    . Hyperlipidemia   . Diabetes mellitus     AODM  . Anemia, unspecified   . Colorectal polyps 2002  . Erectile dysfunction   . Diastolic dysfunction     a. 01/2013 Echo: EF 55%, mid-dist inflat HK, Gr1 DD, Triv AI/TR, Mild MR.  [name] Kitchen Atrial flutter     a. Dx 11/2013. Spont conv. Placed on eliquis.  . Acute renal insufficiency     a. During 11/2013 adm with atrial flutter.  . Myocardial infarction   . Anginal pain   . Dysrhythmia     HX OF ATRIAL FIBRILATION  . Shortness of breath     Past Surgical History  Procedure Laterality Date  . Wisdom tooth extraction    . Colonoscopy w/ polypectomy  2002    [facility]  . Infantile paralysis facial asymmetry    . Coronary stent placement  2007, 2011  . Colonoscopy w/ polypectomy  2004; 2007 negative  . Coronary angioplasty  12/26/2013    Current Outpatient Prescriptions  Medication Sig Dispense Refill  . apixaban (ELIQUIS) 5 MG TABS tablet Take 1 tablet (5 mg total) by mouth 2 (two) times daily. 60 tablet 2  . calcium carbonate (OS-CAL) 600 MG TABS tablet Take 600 mg by mouth 2 (two) times daily with a meal.    . cholecalciferol (VITAMIN D) 1000 UNITS tablet Take 1,000 Units by  mouth daily.    . clopidogrel (PLAVIX) 75 MG tablet Take 1 tablet (75 mg total) by mouth daily with breakfast. 90 tablet 3  . fexofenadine (ALLEGRA) 180 MG tablet Take 180 mg by mouth daily.      [name] Kitchen lisinopril (PRINIVIL,ZESTRIL) 20 MG tablet Take 1 tablet (20 mg total) by mouth daily. 90 tablet 3  . metFORMIN (GLUCOPHAGE) 500 MG tablet Take 1 tablet (500 mg total) by mouth 2 (two) times daily with a meal. 180 tablet 3  . metoprolol (LOPRESSOR) 50 MG tablet Take 1 tablet (50 mg total) by mouth 2 (two) times daily. 180 tablet 3  . Multiple Vitamin (MULTIVITAMIN) tablet Take 1 tablet by mouth daily.      . nitroGLYCERIN (NITROSTAT) 0.4 MG SL tablet Place 0.4 mg under the tongue every 5 (five) minutes as needed for chest pain.    . potassium chloride SA (K-DUR,KLOR-CON) 20  MEQ tablet TAKE 1 TABLET BY MOUTH ONCE DAILY. 30 tablet 6  . simvastatin (ZOCOR) 40 MG tablet Take 1 tablet (40 mg total) by mouth every evening. 90 tablet 3  . vitamin E 100 UNIT capsule Take 100 Units by mouth daily.     No current facility-administered medications for this visit.    Allergies  Allergen Reactions  . Brilinta [Ticagrelor]     Arthralgias & myalgias  . Crestor [Rosuvastatin]     Myalgias (takes Zocor)  . Lipitor [Atorvastatin]     Myalgias (takes Zocor)    History   Social History  . Marital Status: Married    Spouse Name: N/A    Number of Children: 1  . Years of Education: N/A   Occupational History  . maintenance     full time   Social History Main Topics  . Smoking status: Former Smoker    Quit date: 11/02/1984  . Smokeless tobacco: Never Used     Comment: smoked [place], up to 3 ppd (!)  . Alcohol Use: No  . Drug Use: No  . Sexual Activity: Not on file   Other Topics Concern  . Not on file   Social History Narrative    Family History  Problem Relation Age of Onset  . Heart attack Brother 3  . Colon cancer Brother   . Diabetes Neg Hx   . Stroke Neg Hx   . Hypertension Neg Hx     Review of Systems:  As stated in the HPI and otherwise negative.   BP 130/60 mmHg  Pulse 73  Ht 5\' 11"  (1.803 m)  Wt 193 lb (87.544 kg)  BMI 26.93 kg/m2  SpO2 95%  Physical Examination: General: Well developed, well nourished, NAD HEENT: OP clear, mucus membranes moist SKIN: warm, dry. No rashes. Neuro: No focal deficits Musculoskeletal: Muscle strength 5/5 all ext Psychiatric: Mood and affect normal Neck: No JVD, no carotid bruits, no thyromegaly, no lymphadenopathy. Lungs:Clear bilaterally, no wheezes, rhonci, crackles Cardiovascular: Regular rate and rhythm. No murmurs, gallops or rubs. Abdomen:Soft. Bowel sounds present. Non-tender.  Extremities: No lower extremity edema. Pulses are 2 + in the bilateral DP/PT.  Cardiac cath 12/26/13: Left  main: Distal 20% stenosis.  Left Anterior Descending Artery: Moderate caliber vessel that does not reach the apex. The ostium has a 30% stenosis. The mid vessel has diffuse plaque. The first diagonal branch is small to moderate in caliber (2.0 mm) and has tandem 70% stenoses.  Circumflex Artery: Moderate caliber vessel with moderate caliber first obtuse marginal branch. The first OM branch  arises early and has a patent stent in the proximal segment. The mid segment of the OM branch has a 50% stenosis. Distally the OM branch bifurcates and there is complex 50% stenosis involving both vessels beyond the branch point, non-flow limiting and unchanged. The AV groove Circumflex has 60-70% stenosis just after the takeoff of the OM branch but the vessel is small in caliber.  Right Coronary Artery: Large dominant vessel with severe calcification in the proximal and mid vessel. The proximal vessel has a 40% stenosis. The mid vessel has a patent stent. The distal vessel has a 99% stenosis on the proximal edge of the old stent. The remainder of the distal stented segment is patent with minimal restenosis. The posterolateral branch is small in caliber and has a 50% stenosis. The PDA is small to moderate in caliber (2.25 mm vessel) and in the distal segment there is a 60% stenosis followed by a 80% stenosis (unchanged).  Left Ventricular Angiogram: LVEF 40% with inferior wall hypokinesis.   Echo 11/05/13: Left ventricle: The cavity size was at the upper limits of normal. Wall thickness was increased in a pattern of mild LVH. The estimated ejection fraction was 55%. There is hypokinesis of the basalinferolateral myocardium. Doppler parameters are consistent with abnormal left ventricular relaxation (grade 1 diastolic dysfunction). - Aortic valve: Mild regurgitation. - Mitral valve: Trivial regurgitation. - Left atrium: The atrium was moderately dilated. - Right atrium: Central venous pressure: 39mm Hg (est). - Atrial  septum: No defect or patent foramen ovale was identified. - Tricuspid valve: Trivial regurgitation. - Pulmonary arteries: PA peak pressure: 94mm Hg (S). - Pericardium, extracardiac: There was no pericardial effusion  Assessment and Plan:   1. CAD: No recent angina. PCI/DES RCA February 2015. Continue Plavix, statin, beta blocker. No ASA with use of Eliquis. He is asked to wait until end of February 2016 to schedule his circumcision procedure. (This will be the end of his year of Plavix therapy following stent February 2015). He will need to hold Plavix and Eliquis prior to his surgery.   2. HTN: BP is controlled. No changes.    3. Hyperlipidemia: He is on a statin. Lipids have been most recently checked in primary care office. Will continue Zocor 20 mg po QHS.   4. Atrial flutter: Appears to be sinus by exam today. Will continue Eliquis for anti-coagulation. Metoprolol for rate and rhythm control.

## 2014-09-20 NOTE — Patient Instructions (Signed)
Your physician wants you to follow-up in:  6 months. You will receive a reminder letter in the mail two months in advance. If you don't receive a letter, please call our office to schedule the follow-up appointment.   

## 2014-10-11 ENCOUNTER — Encounter (HOSPITAL_COMMUNITY): Payer: Self-pay | Admitting: Cardiology

## 2014-11-05 ENCOUNTER — Emergency Department (HOSPITAL_COMMUNITY)
Admission: EM | Admit: 2014-11-05 | Discharge: 2014-11-05 | Disposition: A | Discharge status: Home / Self Care | Payer: BLUE CROSS/BLUE SHIELD | Attending: Emergency Medicine | Admitting: Emergency Medicine

## 2014-11-05 ENCOUNTER — Encounter (HOSPITAL_COMMUNITY): Payer: Self-pay

## 2014-11-05 ENCOUNTER — Emergency Department (HOSPITAL_COMMUNITY): Payer: BLUE CROSS/BLUE SHIELD

## 2014-11-05 DIAGNOSIS — Z87891 Personal history of nicotine dependence: Secondary | ICD-10-CM | POA: Diagnosis not present

## 2014-11-05 DIAGNOSIS — Z7902 Long term (current) use of antithrombotics/antiplatelets: Secondary | ICD-10-CM | POA: Diagnosis not present

## 2014-11-05 DIAGNOSIS — D649 Anemia, unspecified: Secondary | ICD-10-CM | POA: Insufficient documentation

## 2014-11-05 DIAGNOSIS — Z79899 Other long term (current) drug therapy: Secondary | ICD-10-CM | POA: Insufficient documentation

## 2014-11-05 DIAGNOSIS — I10 Essential (primary) hypertension: Secondary | ICD-10-CM | POA: Insufficient documentation

## 2014-11-05 DIAGNOSIS — Z86010 Personal history of colonic polyps: Secondary | ICD-10-CM | POA: Diagnosis not present

## 2014-11-05 DIAGNOSIS — I4892 Unspecified atrial flutter: Secondary | ICD-10-CM | POA: Diagnosis not present

## 2014-11-05 DIAGNOSIS — E119 Type 2 diabetes mellitus without complications: Secondary | ICD-10-CM | POA: Diagnosis not present

## 2014-11-05 DIAGNOSIS — I25119 Atherosclerotic heart disease of native coronary artery with unspecified angina pectoris: Secondary | ICD-10-CM | POA: Insufficient documentation

## 2014-11-05 DIAGNOSIS — I480 Paroxysmal atrial fibrillation: Secondary | ICD-10-CM | POA: Diagnosis not present

## 2014-11-05 DIAGNOSIS — Z87448 Personal history of other diseases of urinary system: Secondary | ICD-10-CM | POA: Diagnosis not present

## 2014-11-05 DIAGNOSIS — E785 Hyperlipidemia, unspecified: Secondary | ICD-10-CM | POA: Diagnosis not present

## 2014-11-05 DIAGNOSIS — R0602 Shortness of breath: Secondary | ICD-10-CM | POA: Diagnosis not present

## 2014-11-05 DIAGNOSIS — Z9861 Coronary angioplasty status: Secondary | ICD-10-CM | POA: Insufficient documentation

## 2014-11-05 DIAGNOSIS — Z9889 Other specified postprocedural states: Secondary | ICD-10-CM | POA: Diagnosis not present

## 2014-11-05 DIAGNOSIS — I252 Old myocardial infarction: Secondary | ICD-10-CM | POA: Diagnosis not present

## 2014-11-05 DIAGNOSIS — R079 Chest pain, unspecified: Secondary | ICD-10-CM | POA: Diagnosis not present

## 2014-11-05 DIAGNOSIS — Z7982 Long term (current) use of aspirin: Secondary | ICD-10-CM | POA: Insufficient documentation

## 2014-11-05 LAB — CBC WITH DIFFERENTIAL/PLATELET
BASOS PCT: 1 % (ref 0–1)
Basophils Absolute: 0.1 10*3/uL (ref 0.0–0.1)
EOS PCT: 5 % (ref 0–5)
Eosinophils Absolute: 0.4 10*3/uL (ref 0.0–0.7)
HCT: 40.6 % (ref 39.0–52.0)
Hemoglobin: 13.6 g/dL (ref 13.0–17.0)
Lymphocytes Relative: 21 % (ref 12–46)
Lymphs Abs: 1.8 10*3/uL (ref 0.7–4.0)
MCH: 30.1 pg (ref 26.0–34.0)
MCHC: 33.5 g/dL (ref 30.0–36.0)
MCV: 89.8 fL (ref 78.0–100.0)
Monocytes Absolute: 0.9 10*3/uL (ref 0.1–1.0)
Monocytes Relative: 10 % (ref 3–12)
NEUTROS PCT: 63 % (ref 43–77)
Neutro Abs: 5.3 10*3/uL (ref 1.7–7.7)
Platelets: 189 10*3/uL (ref 150–400)
RBC: 4.52 MIL/uL (ref 4.22–5.81)
RDW: 12.7 % (ref 11.5–15.5)
WBC: 8.4 10*3/uL (ref 4.0–10.5)

## 2014-11-05 LAB — BASIC METABOLIC PANEL
Anion gap: 8 (ref 5–15)
BUN: 16 mg/dL (ref 6–23)
CHLORIDE: 105 meq/L (ref 96–112)
CO2: 24 mmol/L (ref 19–32)
CREATININE: 1.18 mg/dL (ref 0.50–1.35)
Calcium: 9.7 mg/dL (ref 8.4–10.5)
GFR calc Af Amer: 68 mL/min — ABNORMAL LOW (ref >90)
GFR calc non Af Amer: 59 mL/min — ABNORMAL LOW (ref >90)
GLUCOSE: 209 mg/dL — AB (ref 70–99)
POTASSIUM: 4.1 mmol/L (ref 3.5–5.1)
Sodium: 137 mmol/L (ref 135–145)

## 2014-11-05 LAB — I-STAT TROPONIN, ED: Troponin i, poc: 0 ng/mL (ref 0.00–0.08)

## 2014-11-05 MED ORDER — DILTIAZEM HCL 100 MG IV SOLR
5.0000 mg/h | Freq: Once | INTRAVENOUS | Status: AC
  Administered 2014-11-05: 5 mg/h via INTRAVENOUS

## 2014-11-05 MED ORDER — DILTIAZEM HCL 25 MG/5ML IV SOLN
20.0000 mg | Freq: Once | INTRAVENOUS | Status: AC
  Administered 2014-11-05: 20 mg via INTRAVENOUS
  Filled 2014-11-05: qty 5

## 2014-11-05 NOTE — ED Notes (Signed)
Per EMS: Pt began experiencing chest pain at 7:30pm tonight over the left chest. Described as a 4/10 pressure. Hx 3 MI's, and 5 Stents since Feb 2015. EMS gave 324 ASA and 1 nitro. Pt running SVT on 12 lead monitor.

## 2014-11-05 NOTE — Discharge Instructions (Signed)
Atrial Fibrillation  Atrial fibrillation is a condition that causes your heart to beat irregularly. It may also cause your heart to beat faster than normal. Atrial fibrillation can prevent your heart from pumping blood normally. It increases your risk of stroke and heart problems.  HOME CARE  · Take medications as told by your doctor.  · Only take medications that your doctor says are safe. Some medications can make the condition worse or happen again.  · If blood thinners were prescribed by your doctor, take them exactly as told. Too much can cause bleeding. Too little and you will not have the needed protection against stroke and other problems.  · Perform blood tests at home if told by your doctor.  · Perform blood tests exactly as told by your doctor.  · Do not drink alcohol.  · Do not drink beverages with caffeine such as coffee, soda, and some teas.  · Maintain a healthy weight.  · Do not use diet pills unless your doctor says they are safe. They may make heart problems worse.  · Follow diet instructions as told by your doctor.  · Exercise regularly as told by your doctor.  · Keep all follow-up appointments.  GET HELP IF:  · You notice a change in the speed, rhythm, or strength of your heartbeat.  · You suddenly begin peeing (urinating) more often.  · You get tired more easily when moving or exercising.  GET HELP RIGHT AWAY IF:   · You have chest or belly (abdominal) pain.  · You feel sick to your stomach (nauseous).  · You are short of breath.  · You suddenly have swollen feet and ankles.  · You feel dizzy.  · You face, arms, or legs feel numb or weak.  · There is a change in your vision or speech.  MAKE SURE YOU:   · Understand these instructions.  · Will watch your condition.  · Will get help right away if you are not doing well or get worse.  Document Released: 07/28/2008 Document Revised: 03/05/2014 Document Reviewed: 11/29/2012  ExitCare® Patient Information ©2015 [employer]. This information is not  intended to replace advice given to you by your health care provider. Make sure you discuss any questions you have with your health care provider.

## 2014-11-05 NOTE — ED Notes (Signed)
Pt declined w/c escorted by6 this RN

## 2014-11-05 NOTE — ED Provider Notes (Signed)
CSN: [id number]     Arrival date & time 11/05/14  2100 History   First MD Initiated Contact with Patient 11/05/14 2115     Chief complaint - chest pain   Patient is a 74 y.o. male presenting with chest pain. The history is provided by the patient.  Chest Pain Pain location:  Substernal area Pain quality: pressure   Pain severity:  Moderate Onset quality:  Gradual Duration:  3 hours Timing:  Constant Progression:  Worsening Chronicity:  New Relieved by:  Nothing Worsened by:  Nothing tried Associated symptoms: shortness of breath   Associated symptoms: no fever   Associated symptoms comment:  Palpitations  Patient reports onset of CP/SOB earlier tonight He also reports palpitations Denies fever/vomiting/weakness   Past Medical History  Diagnosis Date  . CAD (coronary artery disease), native coronary artery     a. s/p cath May 2011 with placement of DES distal RCA;  b. 01/2013 NSTEMI/Cath/PCI: LM 20d, LAD 30ost, D1 70, LCX 71m, OM1 99p (2.75x12 Promus Premier DES), 99m, 50d in branches, RCA Ca2+ prox, 40p, patent stents mid/dist, RPL 50 small, PDA 60/80, EF 45% inf HK.  [name] Kitchen Hypertension   . Hyperlipidemia   . Diabetes mellitus     AODM  . Anemia, unspecified   . Colorectal polyps 2002  . Erectile dysfunction   . Diastolic dysfunction     a. 01/2013 Echo: EF 55%, mid-dist inflat HK, Gr1 DD, Triv AI/TR, Mild MR.  [name] Kitchen Atrial flutter     a. Dx 11/2013. Spont conv. Placed on eliquis.  . Acute renal insufficiency     a. During 11/2013 adm with atrial flutter.  . Myocardial infarction   . Anginal pain   . Dysrhythmia     HX OF ATRIAL FIBRILATION  . Shortness of breath    Past Surgical History  Procedure Laterality Date  . Wisdom tooth extraction    . Colonoscopy w/ polypectomy  2002    [facility]  . Infantile paralysis facial asymmetry    . Coronary stent placement  2007, 2011  . Colonoscopy w/ polypectomy  2004; 2007 negative  . Coronary angioplasty  12/26/2013  . Left heart  catheterization with coronary angiogram N/A 10/02/2012    Procedure: LEFT HEART CATHETERIZATION WITH CORONARY ANGIOGRAM;  Surgeon: Peter M Martinique, MD;  Location: [facility] CATH LAB;  Service: Cardiovascular;  Laterality: N/A;  . Percutaneous coronary stent intervention (pci-s)  10/02/2012    Procedure: PERCUTANEOUS CORONARY STENT INTERVENTION (PCI-S);  Surgeon: Peter M Martinique, MD;  Location: [facility] CATH LAB;  Service: Cardiovascular;;  . Left heart catheterization with coronary angiogram N/A 02/20/2013    Procedure: LEFT HEART CATHETERIZATION WITH CORONARY ANGIOGRAM;  Surgeon: Burnell Blanks, MD;  Location: [facility] CATH LAB;  Service: Cardiovascular;  Laterality: N/A;  . Percutaneous coronary stent intervention (pci-s)  02/20/2013    Procedure: PERCUTANEOUS CORONARY STENT INTERVENTION (PCI-S);  Surgeon: Burnell Blanks, MD;  Location: [facility] CATH LAB;  Service: Cardiovascular;;  . Left heart catheterization with coronary angiogram N/A 12/26/2013    Procedure: LEFT HEART CATHETERIZATION WITH CORONARY ANGIOGRAM;  Surgeon: Burnell Blanks, MD;  Location: [facility] CATH LAB;  Service: Cardiovascular;  Laterality: N/A;  . Percutaneous coronary stent intervention (pci-s)  12/26/2013    Procedure: PERCUTANEOUS CORONARY STENT INTERVENTION (PCI-S);  Surgeon: Burnell Blanks, MD;  Location: [facility] CATH LAB;  Service: Cardiovascular;;   Family History  Problem Relation Age of Onset  . Heart attack Brother 63  . Colon cancer Brother   .  Diabetes Neg Hx   . Stroke Neg Hx   . Hypertension Neg Hx    History  Substance Use Topics  . Smoking status: Former Smoker    Quit date: 11/02/1984  . Smokeless tobacco: Never Used     Comment: smoked [place], up to 3 ppd (!)  . Alcohol Use: No    Review of Systems  Constitutional: Negative for fever.  Respiratory: Positive for shortness of breath.   Cardiovascular: Positive for chest pain.  Neurological: Negative for syncope.  All other systems reviewed and are  negative.     Allergies  Brilinta; Crestor; and Lipitor  Home Medications   Prior to Admission medications   Medication Sig Start Date End Date Taking? Authorizing Provider  apixaban (ELIQUIS) 5 MG TABS tablet Take 1 tablet (5 mg total) by mouth 2 (two) times daily. 09/20/14  Yes Burnell Blanks, MD  aspirin 81 MG tablet Take 324 mg by mouth daily.   Yes Historical Provider, MD  calcium carbonate (OS-CAL) 600 MG TABS tablet Take 600 mg by mouth 2 (two) times daily with a meal.   Yes Historical Provider, MD  cholecalciferol (VITAMIN D) 1000 UNITS tablet Take 1,000 Units by mouth daily.   Yes Historical Provider, MD  clopidogrel (PLAVIX) 75 MG tablet Take 1 tablet (75 mg total) by mouth daily with breakfast. 03/07/14  Yes Burnell Blanks, MD  fexofenadine (ALLEGRA) 180 MG tablet Take 180 mg by mouth daily.     Yes Historical Provider, MD  lisinopril (PRINIVIL,ZESTRIL) 20 MG tablet Take 1 tablet (20 mg total) by mouth daily. 03/07/14  Yes Burnell Blanks, MD  metFORMIN (GLUCOPHAGE) 500 MG tablet Take 1 tablet (500 mg total) by mouth 2 (two) times daily with a meal. 03/07/14  Yes Burnell Blanks, MD  metoprolol (LOPRESSOR) 50 MG tablet Take 1 tablet (50 mg total) by mouth 2 (two) times daily. 03/07/14  Yes Burnell Blanks, MD  Multiple Vitamin (MULTIVITAMIN) tablet Take 1 tablet by mouth daily.     Yes Historical Provider, MD  nitroGLYCERIN (NITROSTAT) 0.4 MG SL tablet Place 0.4 mg under the tongue every 5 (five) minutes as needed for chest pain.   Yes Historical Provider, MD  potassium chloride SA (K-DUR,KLOR-CON) 20 MEQ tablet Take 1 tablet (20 mEq total) by mouth daily. Patient taking differently: Take 20 mEq by mouth daily as needed (cramping).  09/20/14  Yes Burnell Blanks, MD  simvastatin (ZOCOR) 40 MG tablet Take 1 tablet (40 mg total) by mouth every evening. 03/07/14  Yes Burnell Blanks, MD  vitamin E 100 UNIT capsule Take 100 Units by mouth  daily.   Yes Historical Provider, MD   BP 109/55 mmHg  Pulse 86  Temp(Src) 97.9 F (36.6 C)  Resp 12  SpO2 96% Physical Exam CONSTITUTIONAL: Well developed/well nourished HEAD: Normocephalic/atraumatic EYES: EOMI/PERRL ENMT: Mucous membranes moist NECK: supple no meningeal signs SPINE/BACK:entire spine nontender CV: tachycardic, no loud murmurs noted LUNGS: Lungs are clear to auscultation bilaterally, no apparent distress ABDOMEN: soft, nontender, no rebound or guarding, bowel sounds noted throughout abdomen GU:no cva tenderness NEURO: Pt is awake/alert/appropriate, moves all extremitiesx4.  No facial droop.   EXTREMITIES: pulses normal/equal, full ROM SKIN: warm, color normal PSYCH: no abnormalities of mood noted, alert and oriented to situation  ED Course  Procedures   11:08 PM Pt has spontaneously converted to sinus rhythm BP 109/55 mmHg  Pulse 86  Temp(Src) 97.9 F (36.6 C)  Resp 12  SpO2  96% He feels well and would like to go home He feels all of his symptoms were due to atrial fibrillation Advised that we can not r/o ACS with one ekg/troponin He understands this and will still like to go home He is awake/alert, appropriate He will continue eliquis/lopressor D/w cardiology on call (dr turner) she will help arrange outpatient followup   Medications  diltiazem (CARDIZEM) injection 20 mg (0 mg Intravenous Stopped 11/05/14 2142)  diltiazem (CARDIZEM) 100 mg in dextrose 5 % 100 mL (1 mg/mL) infusion (5 mg/hr Intravenous New Bag/Given 11/05/14 2142)    Labs Review Labs Reviewed  BASIC METABOLIC PANEL - Abnormal; Notable for the following:    Glucose, Bld 209 (*)    GFR calc non Af Amer 59 (*)    GFR calc Af Amer 68 (*)    All other components within normal limits  CBC WITH DIFFERENTIAL  I-STAT TROPOININ, ED    Imaging Review Dg Chest Portable 1 View  11/05/2014   CLINICAL DATA:  Chest pain and tachycardia tonight. History of hypertension and heart disease.   EXAM: PORTABLE CHEST - 1 VIEW  COMPARISON:  11/03/2013  FINDINGS: Shallow inspiration. The heart size and mediastinal contours are within normal limits. Both lungs are clear. The visualized skeletal structures are unremarkable.  IMPRESSION: No active disease.   Electronically Signed   By: Lucienne Capers M.D.   On: 11/05/2014 21:43     EKG Interpretation   Date/Time:  Monday November 05 2014 21:02:15 EST Ventricular Rate:  143 PR Interval:    QRS Duration: 88 QT Interval:  315 QTC Calculation: 486 R Axis:   35 Text Interpretation:  Atrial fibrillation with rapid V-rate Repolarization  abnormality, prob rate related changes noted from prior Confirmed by  Christy Gentles  MD, [place] ([id number]) on 11/05/2014 9:16:47 PM      EKG Interpretation  Date/Time:  Monday November 05 2014 22:19:24 EST Ventricular Rate:  83 PR Interval:  154 QRS Duration: 90 QT Interval:  370 QTC Calculation: 435 R Axis:   47 Text Interpretation:  Sinus rhythm Normal ECG changes noted from prior, now back in sinus rhythm Confirmed by Christy Gentles  MD, Elenore Rota ([id number]) on 11/05/2014 10:33:00 PM       MDM   Final diagnoses:  Chest pain  Paroxysmal atrial fibrillation    Nursing notes including past medical history and social history reviewed and considered in documentation xrays/imaging reviewed by myself and considered during evaluation Labs/vital reviewed myself and considered during evaluation     Sharyon Cable, MD 11/05/14 2310

## 2014-11-05 NOTE — ED Notes (Signed)
MD at bedside. 

## 2014-11-06 ENCOUNTER — Telehealth: Payer: Self-pay | Admitting: Cardiovascular Disease

## 2014-11-06 NOTE — Telephone Encounter (Signed)
New message       Pt had an episode last night and would like pat to give him a call

## 2014-11-06 NOTE — Telephone Encounter (Signed)
Spoke with pt. He was seen in ED last night with rapid atrial fib. Felt heaviness in chest when heart rate elevated. Converted to SR prior to discharge from ED. Feels OK today. States he had episode of elevated heart rate in late November and on Christmas day also.  Is taking Eliquis and metoprolol.  Pt asking if there is another medication he can try to control heart rate.  Appt made for pt to see Richardson Dopp, PA on November 07, 2014 at 9:50

## 2014-11-07 ENCOUNTER — Encounter: Payer: Self-pay | Admitting: Physician Assistant

## 2014-11-07 ENCOUNTER — Telehealth: Payer: Self-pay | Admitting: *Deleted

## 2014-11-07 ENCOUNTER — Ambulatory Visit (INDEPENDENT_AMBULATORY_CARE_PROVIDER_SITE_OTHER): Payer: BLUE CROSS/BLUE SHIELD | Admitting: Physician Assistant

## 2014-11-07 VITALS — BP 122/68 | HR 58 | Ht 71.0 in | Wt 190.8 lb

## 2014-11-07 DIAGNOSIS — E785 Hyperlipidemia, unspecified: Secondary | ICD-10-CM

## 2014-11-07 DIAGNOSIS — I2510 Atherosclerotic heart disease of native coronary artery without angina pectoris: Secondary | ICD-10-CM | POA: Diagnosis not present

## 2014-11-07 DIAGNOSIS — I484 Atypical atrial flutter: Secondary | ICD-10-CM

## 2014-11-07 DIAGNOSIS — I10 Essential (primary) hypertension: Secondary | ICD-10-CM

## 2014-11-07 LAB — TSH: TSH: 0.74 u[IU]/mL (ref 0.35–4.50)

## 2014-11-07 MED ORDER — METOPROLOL TARTRATE 25 MG PO TABS: 25.0000 mg | ORAL_TABLET | Freq: Two times a day (BID) | ORAL | Status: DC

## 2014-11-07 MED ORDER — DILTIAZEM HCL ER COATED BEADS 120 MG PO CP24: 120.0000 mg | ORAL_CAPSULE | Freq: Every day | ORAL | Status: DC

## 2014-11-07 NOTE — Telephone Encounter (Signed)
Thanks. cdm 

## 2014-11-07 NOTE — Progress Notes (Signed)
Cardiology Office Note   Date:  11/07/2014   ID:  Gregory Bates, DOB [birth year], MRN [id number]  PCP:  PROVIDER NOT IN SYSTEM  Cardiologist:  Dr. Lauree Chandler      History of Present Illness: Gregory Bates is a 74 y.o. male with a history of CAD status post inferior STEMI in 10/2012 treated with a DES to the RCA, non-STEMI in 01/2013 treated with a DES to the OM1, and unstable angina in 12/2013 treated with a DES to the distal RCA. Other history includes DM2, HTN, HL, paroxysmal atrial flutter. He is on a apixaban for anticoagulation.  Last seen by Dr. Angelena Form 09/2014.  He presented to the emergency room 11/05/14 with chest pain in the setting of atrial fibrillation with RVR. He spontaneously converted to NSR.  Cardiac markers remained normal. He called the office and noted several episodes of rapid heart rates in the recent past. He was placed on my schedule for further evaluation.  Review of the ECG from the hospital demonstrates HR 143 and probable atypical AFlutter.  Patient notes development of chest discomfort in his left chest. He describes this as a heaviness. Symptoms completely resolved when he received a dose of IV diltiazem and he returned to normal sinus rhythm. He's had other episodes in the recent past. He denies exertional chest discomfort. He denies symptoms reminiscent of his previous angina. He denies significant dyspnea. He denies syncope, orthopnea, PND or edema.   Studies:   - Echo (1/15): Mild LVH, EF 55%, inferolateral hypokinesis, grade 1 diastolic dysfunction, mild AI, trivial MR, moderate LAE, PASP 36 mmHg   - LHC (2/15): Distal LM 20%, ostial LAD 30%, small to mod D1 with tandem 70% stenoses, prox OM1 stent patent with mid 50% stenosis, both vessels of the dist OM bifurcation 50%, AVCFX 60-70%, prox RCA 40%, mid RCA stent patent, dist RCA 99% on prox edge of old stent, remainder of stented portion patent, PL Br 50%, small to mod PDA Br 60% followed by 80%, EF 40%  with inf HK >> PCI:  Promus Premier DES to dist RCA  - Stress myoview 08/07/13 with LVEF 44%, inferior scar, no ischemia   Recent Labs: 12/24/2013: ALT 16 11/05/2014: BUN 16; Creatinine 1.18; Hemoglobin 13.6; Potassium 4.1; Sodium 137   Estimated Creatinine Clearance: 58.5 mL/min (by C-G formula based on Cr of 1.18).    Recent Radiology: Dg Chest Portable 1 View  11/05/2014   CLINICAL DATA:  Chest pain and tachycardia tonight. History of hypertension and heart disease.  EXAM: PORTABLE CHEST - 1 VIEW  COMPARISON:  11/03/2013  FINDINGS: Shallow inspiration. The heart size and mediastinal contours are within normal limits. Both lungs are clear. The visualized skeletal structures are unremarkable.  IMPRESSION: No active disease.   Electronically Signed   By: Lucienne Capers M.D.   On: 11/05/2014 21:43      Wt Readings from Last 3 Encounters:  11/07/14 190 lb 12.8 oz (86.546 kg)  09/20/14 193 lb (87.544 kg)  12/27/13 185 lb 6.5 oz (84.1 kg)     Past Medical History  Diagnosis Date  . CAD (coronary artery disease), native coronary artery     a. s/p cath May 2011 with placement of DES distal RCA;  b. 01/2013 NSTEMI/Cath/PCI: LM 20d, LAD 30ost, D1 70, LCX 39m, OM1 99p (2.75x12 Promus Premier DES), 59m, 50d in branches, RCA Ca2+ prox, 40p, patent stents mid/dist, RPL 50 small, PDA 60/80, EF 45% inf HK.  [name] Kitchen Hypertension   .  Hyperlipidemia   . Diabetes mellitus     AODM  . Anemia, unspecified   . Colorectal polyps 2002  . Erectile dysfunction   . Diastolic dysfunction     a. 01/2013 Echo: EF 55%, mid-dist inflat HK, Gr1 DD, Triv AI/TR, Mild MR.  [name] Kitchen Atrial flutter     a. Dx 11/2013. Spont conv. Placed on eliquis.  . Acute renal insufficiency     a. During 11/2013 adm with atrial flutter.  . Myocardial infarction   . Anginal pain   . Dysrhythmia     HX OF ATRIAL FIBRILATION  . Shortness of breath     Current Outpatient Prescriptions  Medication Sig Dispense Refill  . apixaban (ELIQUIS) 5  MG TABS tablet Take 1 tablet (5 mg total) by mouth 2 (two) times daily. 60 tablet 11  . calcium carbonate (OS-CAL) 600 MG TABS tablet Take 600 mg by mouth 2 (two) times daily with a meal.    . cholecalciferol (VITAMIN D) 1000 UNITS tablet Take 1,000 Units by mouth daily.    . clopidogrel (PLAVIX) 75 MG tablet Take 1 tablet (75 mg total) by mouth daily with breakfast. 90 tablet 3  . fexofenadine (ALLEGRA) 180 MG tablet Take 180 mg by mouth daily.      [name] Kitchen lisinopril (PRINIVIL,ZESTRIL) 20 MG tablet Take 1 tablet (20 mg total) by mouth daily. 90 tablet 3  . metFORMIN (GLUCOPHAGE) 500 MG tablet Take 1 tablet (500 mg total) by mouth 2 (two) times daily with a meal. (Patient taking differently: Take 500 mg by mouth daily with breakfast. ) 180 tablet 3  . metoprolol (LOPRESSOR) 50 MG tablet Take 1 tablet (50 mg total) by mouth 2 (two) times daily. 180 tablet 3  . Multiple Vitamin (MULTIVITAMIN) tablet Take 1 tablet by mouth daily.      . nitroGLYCERIN (NITROSTAT) 0.4 MG SL tablet Place 0.4 mg under the tongue every 5 (five) minutes as needed for chest pain.    . potassium chloride SA (K-DUR,KLOR-CON) 20 MEQ tablet Take 20 mEq by mouth daily as needed (FOR LEG CRAMPS).    . simvastatin (ZOCOR) 40 MG tablet Take 1 tablet (40 mg total) by mouth every evening. 90 tablet 3  . vitamin E 100 UNIT capsule Take 100 Units by mouth daily.     No current facility-administered medications for this visit.     Allergies:   Brilinta; Crestor; and Lipitor   Social History:  The patient  reports that he quit smoking about 30 years ago. He has never used smokeless tobacco. He reports that he does not drink alcohol or use illicit drugs.   Family History:  The patient's family history includes Colon cancer in his brother; Heart attack (age of onset: 24) in his brother. There is no history of Diabetes, Stroke, or Hypertension.    ROS:  Please see the history of present illness.      All other systems reviewed and  negative.    PHYSICAL EXAM: VS:  BP 122/68 mmHg  Pulse 58  Ht 5\' 11"  (1.803 m)  Wt 190 lb 12.8 oz (86.546 kg)  BMI 26.62 kg/m2 Well nourished, well developed, in no acute distress HEENT: normal Neck: no JVD Cardiac:  normal S1, S2;  RRR; no murmur   Lungs:   clear to auscultation bilaterally, no wheezing, rhonchi or rales Abd: soft, nontender, no hepatomegaly Ext: no edema Skin: warm and dry Neuro:  CNs 2-12 intact, no focal abnormalities noted  EKG:  Sinus bradycardia,  HR 58, normal axis, inferior Q waves, nonspecific ST-T wave changes, no change from prior tracing   ASSESSMENT AND PLAN:  1.  Paroxysmal Atrial Flutter:  Patient has had recurrent episodes of symptomatic atrial flutter with rapid ventricular rate. He remains on anticoagulation with a apixaban. We discussed the options for rhythm control. He is not a candidate for class IC drugs due to CAD. I reviewed his case today with Dr. Marlou Porch (DOD). We discussed the possibility of adding diltiazem, which he had a good response to in the emergency room versus adding an antiarrhythmic such as amiodarone, Tikosyn or sotalol. For now, we have chosen the addition of diltiazem.    -  Decrease metoprolol to 25 mg twice a day.    -  Add diltiazem CD 120 mg daily.    -  If he fails this regimen, consider proceeding with amiodarone or possibly Multaq. 2.  Coronary Artery Disease:  No angina. Continue Plavix, beta blocker, statin. 3.  Hypertension:  Controlled. 4.  Hyperlipidemia:  Continue statin.   Disposition:   FU with me in 2 weeks and Dr. Angelena Form in mid-to-late February.   Signed, Versie Starks, MHS 11/07/2014 10:53 AM    [facility] [address], [place] [place], [place]  [address] Phone: [phone]; Fax: [phone]

## 2014-11-07 NOTE — Telephone Encounter (Signed)
pt notified about lab results with verbal understanding  

## 2014-11-07 NOTE — Patient Instructions (Signed)
Your physician has recommended you make the following change in your medication:  1. DECREASE METOPROLOL TO 25 MG 1 TABLET TWICE DAILY; NEW RX SENT IN FOR THE NEW DOSE 2. START DILTIAZEM CD 120 MG 1 CAP DAILY  LAB WORK TODAY; TSH  Your physician recommends that you schedule a follow-up appointment in: 11/21/14 3 PM WITH SCOTT WEAVER, [facility]  Your physician recommends that you schedule a follow-up appointment in: 01/03/15 4 PM WITH DR. Angelena Form

## 2014-11-12 ENCOUNTER — Emergency Department (HOSPITAL_COMMUNITY)
Admission: EM | Admit: 2014-11-12 | Discharge: 2014-11-12 | Disposition: A | Discharge status: Home / Self Care | Payer: BLUE CROSS/BLUE SHIELD | Attending: Emergency Medicine | Admitting: Emergency Medicine

## 2014-11-12 ENCOUNTER — Encounter (HOSPITAL_COMMUNITY): Payer: Self-pay | Admitting: Emergency Medicine

## 2014-11-12 ENCOUNTER — Emergency Department (HOSPITAL_COMMUNITY): Payer: BLUE CROSS/BLUE SHIELD

## 2014-11-12 DIAGNOSIS — E119 Type 2 diabetes mellitus without complications: Secondary | ICD-10-CM | POA: Insufficient documentation

## 2014-11-12 DIAGNOSIS — I10 Essential (primary) hypertension: Secondary | ICD-10-CM | POA: Insufficient documentation

## 2014-11-12 DIAGNOSIS — I2510 Atherosclerotic heart disease of native coronary artery without angina pectoris: Secondary | ICD-10-CM | POA: Insufficient documentation

## 2014-11-12 DIAGNOSIS — I252 Old myocardial infarction: Secondary | ICD-10-CM | POA: Diagnosis not present

## 2014-11-12 DIAGNOSIS — Z79899 Other long term (current) drug therapy: Secondary | ICD-10-CM | POA: Insufficient documentation

## 2014-11-12 DIAGNOSIS — Z86010 Personal history of colonic polyps: Secondary | ICD-10-CM | POA: Diagnosis not present

## 2014-11-12 DIAGNOSIS — Z862 Personal history of diseases of the blood and blood-forming organs and certain disorders involving the immune mechanism: Secondary | ICD-10-CM | POA: Insufficient documentation

## 2014-11-12 DIAGNOSIS — I484 Atypical atrial flutter: Secondary | ICD-10-CM

## 2014-11-12 DIAGNOSIS — I4891 Unspecified atrial fibrillation: Secondary | ICD-10-CM | POA: Diagnosis not present

## 2014-11-12 DIAGNOSIS — I519 Heart disease, unspecified: Secondary | ICD-10-CM | POA: Insufficient documentation

## 2014-11-12 DIAGNOSIS — Z87891 Personal history of nicotine dependence: Secondary | ICD-10-CM | POA: Insufficient documentation

## 2014-11-12 DIAGNOSIS — E785 Hyperlipidemia, unspecified: Secondary | ICD-10-CM | POA: Diagnosis not present

## 2014-11-12 DIAGNOSIS — R000 Tachycardia, unspecified: Secondary | ICD-10-CM | POA: Diagnosis present

## 2014-11-12 DIAGNOSIS — Z7902 Long term (current) use of antithrombotics/antiplatelets: Secondary | ICD-10-CM | POA: Diagnosis not present

## 2014-11-12 DIAGNOSIS — Z7901 Long term (current) use of anticoagulants: Secondary | ICD-10-CM | POA: Diagnosis not present

## 2014-11-12 DIAGNOSIS — I4892 Unspecified atrial flutter: Secondary | ICD-10-CM | POA: Diagnosis not present

## 2014-11-12 LAB — BASIC METABOLIC PANEL
Anion gap: 9 (ref 5–15)
BUN: 19 mg/dL (ref 6–23)
CHLORIDE: 102 meq/L (ref 96–112)
CO2: 25 mmol/L (ref 19–32)
CREATININE: 1.3 mg/dL (ref 0.50–1.35)
Calcium: 9.6 mg/dL (ref 8.4–10.5)
GFR calc Af Amer: 61 mL/min — ABNORMAL LOW (ref >90)
GFR, EST NON AFRICAN AMERICAN: 52 mL/min — AB (ref >90)
GLUCOSE: 296 mg/dL — AB (ref 70–99)
Potassium: 4.3 mmol/L (ref 3.5–5.1)
Sodium: 136 mmol/L (ref 135–145)

## 2014-11-12 LAB — CBC WITH DIFFERENTIAL/PLATELET
Basophils Absolute: 0.1 10*3/uL (ref 0.0–0.1)
Basophils Relative: 1 % (ref 0–1)
EOS PCT: 4 % (ref 0–5)
Eosinophils Absolute: 0.4 10*3/uL (ref 0.0–0.7)
HCT: 41.9 % (ref 39.0–52.0)
HEMOGLOBIN: 14.1 g/dL (ref 13.0–17.0)
Lymphocytes Relative: 17 % (ref 12–46)
Lymphs Abs: 1.7 10*3/uL (ref 0.7–4.0)
MCH: 29.7 pg (ref 26.0–34.0)
MCHC: 33.7 g/dL (ref 30.0–36.0)
MCV: 88.2 fL (ref 78.0–100.0)
MONO ABS: 0.8 10*3/uL (ref 0.1–1.0)
Monocytes Relative: 8 % (ref 3–12)
Neutro Abs: 6.9 10*3/uL (ref 1.7–7.7)
Neutrophils Relative %: 70 % (ref 43–77)
PLATELETS: 219 10*3/uL (ref 150–400)
RBC: 4.75 MIL/uL (ref 4.22–5.81)
RDW: 12.5 % (ref 11.5–15.5)
WBC: 9.8 10*3/uL (ref 4.0–10.5)

## 2014-11-12 LAB — I-STAT TROPONIN, ED
TROPONIN I, POC: 0.01 ng/mL (ref 0.00–0.08)
Troponin i, poc: 0 ng/mL (ref 0.00–0.08)

## 2014-11-12 LAB — PROTIME-INR
INR: 1.09 (ref 0.00–1.49)
Prothrombin Time: 14.2 seconds (ref 11.6–15.2)

## 2014-11-12 LAB — BRAIN NATRIURETIC PEPTIDE: B Natriuretic Peptide: 111.9 pg/mL — ABNORMAL HIGH (ref 0.0–100.0)

## 2014-11-12 MED ORDER — DILTIAZEM LOAD VIA INFUSION
15.0000 mg | Freq: Once | INTRAVENOUS | Status: AC
  Administered 2014-11-12: 15 mg via INTRAVENOUS
  Filled 2014-11-12: qty 15

## 2014-11-12 MED ORDER — DILTIAZEM HCL 100 MG IV SOLR
5.0000 mg/h | INTRAVENOUS | Status: DC
  Administered 2014-11-12: 5 mg/h via INTRAVENOUS

## 2014-11-12 MED ORDER — DILTIAZEM HCL 90 MG PO TABS
180.0000 mg | ORAL_TABLET | Freq: Once | ORAL | Status: AC
  Administered 2014-11-12: 180 mg via ORAL
  Filled 2014-11-12: qty 2

## 2014-11-12 MED ORDER — DILTIAZEM HCL ER COATED BEADS 180 MG PO CP24
180.0000 mg | ORAL_CAPSULE | Freq: Every day | ORAL | Status: DC
Start: 2014-11-12 — End: 2014-11-16

## 2014-11-12 NOTE — ED Provider Notes (Signed)
CSN: [id number]     Arrival date & time 11/12/14  1450 History   First MD Initiated Contact with Patient 11/12/14 1502     Chief Complaint  Patient presents with  . Tachycardia     (Consider location/radiation/quality/duration/timing/severity/associated sxs/prior Treatment) Patient is a 74 y.o. male presenting with palpitations.  Palpitations Palpitations quality:  Irregular Onset quality:  Sudden Duration:  5 hours Timing:  Constant Progression:  Unchanged Chronicity:  Recurrent Relieved by:  Nothing Worsened by:  Nothing tried Ineffective treatments:  None tried (home medications) Associated symptoms: shortness of breath   Associated symptoms: no back pain, no chest pain, no chest pressure, no cough, no leg pain, no lower extremity edema, no nausea and no vomiting   Risk factors: heart disease and hx of atrial fibrillation   Risk factors: no diabetes mellitus, no hx of DVT and no hx of PE     Past Medical History  Diagnosis Date  . CAD (coronary artery disease), native coronary artery     a. s/p cath May 2011 with placement of DES distal RCA;  b. 01/2013 NSTEMI/Cath/PCI: LM 20d, LAD 30ost, D1 70, LCX 66m, OM1 99p (2.75x12 Promus Premier DES), 58m, 50d in branches, RCA Ca2+ prox, 40p, patent stents mid/dist, RPL 50 small, PDA 60/80, EF 45% inf HK.  [name] Kitchen Hypertension   . Hyperlipidemia   . Diabetes mellitus     AODM  . Anemia, unspecified   . Colorectal polyps 2002  . Erectile dysfunction   . Diastolic dysfunction     a. 01/2013 Echo: EF 55%, mid-dist inflat HK, Gr1 DD, Triv AI/TR, Mild MR.  [name] Kitchen Atrial flutter     a. Dx 11/2013. Spont conv. Placed on eliquis.  . Acute renal insufficiency     a. During 11/2013 adm with atrial flutter.  . Myocardial infarction   . Anginal pain   . Dysrhythmia     HX OF ATRIAL FIBRILATION  . Shortness of breath    Past Surgical History  Procedure Laterality Date  . Wisdom tooth extraction    . Colonoscopy w/ polypectomy  2002    [facility]   . Infantile paralysis facial asymmetry    . Coronary stent placement  2007, 2011  . Colonoscopy w/ polypectomy  2004; 2007 negative  . Coronary angioplasty  12/26/2013  . Left heart catheterization with coronary angiogram N/A 10/02/2012    Procedure: LEFT HEART CATHETERIZATION WITH CORONARY ANGIOGRAM;  Surgeon: Peter M Martinique, MD;  Location: [facility] CATH LAB;  Service: Cardiovascular;  Laterality: N/A;  . Percutaneous coronary stent intervention (pci-s)  10/02/2012    Procedure: PERCUTANEOUS CORONARY STENT INTERVENTION (PCI-S);  Surgeon: Peter M Martinique, MD;  Location: [facility] CATH LAB;  Service: Cardiovascular;;  . Left heart catheterization with coronary angiogram N/A 02/20/2013    Procedure: LEFT HEART CATHETERIZATION WITH CORONARY ANGIOGRAM;  Surgeon: Burnell Blanks, MD;  Location: [facility] CATH LAB;  Service: Cardiovascular;  Laterality: N/A;  . Percutaneous coronary stent intervention (pci-s)  02/20/2013    Procedure: PERCUTANEOUS CORONARY STENT INTERVENTION (PCI-S);  Surgeon: Burnell Blanks, MD;  Location: [facility] CATH LAB;  Service: Cardiovascular;;  . Left heart catheterization with coronary angiogram N/A 12/26/2013    Procedure: LEFT HEART CATHETERIZATION WITH CORONARY ANGIOGRAM;  Surgeon: Burnell Blanks, MD;  Location: [facility] CATH LAB;  Service: Cardiovascular;  Laterality: N/A;  . Percutaneous coronary stent intervention (pci-s)  12/26/2013    Procedure: PERCUTANEOUS CORONARY STENT INTERVENTION (PCI-S);  Surgeon: Burnell Blanks, MD;  Location: [facility] CATH  LAB;  Service: Cardiovascular;;   Family History  Problem Relation Age of Onset  . Heart attack Brother 71  . Colon cancer Brother   . Diabetes Neg Hx   . Stroke Neg Hx   . Hypertension Neg Hx    History  Substance Use Topics  . Smoking status: Former Smoker    Quit date: 11/02/1984  . Smokeless tobacco: Never Used     Comment: smoked [place], up to 3 ppd (!)  . Alcohol Use: No    Review of Systems  Constitutional:  Negative for fever.  HENT: Negative for sore throat.   Eyes: Negative for visual disturbance.  Respiratory: Positive for shortness of breath. Negative for cough.   Cardiovascular: Positive for palpitations. Negative for chest pain.  Gastrointestinal: Negative for nausea, vomiting and abdominal pain.  Genitourinary: Negative for difficulty urinating.  Musculoskeletal: Negative for back pain and neck stiffness.  Skin: Negative for rash.  Neurological: Negative for syncope and headaches.      Allergies  Brilinta; Crestor; and Lipitor  Home Medications   Prior to Admission medications   Medication Sig Start Date End Date Taking? Authorizing Provider  apixaban (ELIQUIS) 5 MG TABS tablet Take 1 tablet (5 mg total) by mouth 2 (two) times daily. 09/20/14  Yes Burnell Blanks, MD  calcium carbonate (OS-CAL) 600 MG TABS tablet Take 600 mg by mouth 2 (two) times daily with a meal.   Yes Historical Provider, MD  cholecalciferol (VITAMIN D) 1000 UNITS tablet Take 1,000 Units by mouth daily.   Yes Historical Provider, MD  clopidogrel (PLAVIX) 75 MG tablet Take 1 tablet (75 mg total) by mouth daily with breakfast. 03/07/14  Yes Burnell Blanks, MD  diltiazem (CARDIZEM CD) 120 MG 24 hr capsule Take 1 capsule (120 mg total) by mouth daily. 11/07/14  Yes Scott T Weaver, PA-C  fexofenadine (ALLEGRA) 180 MG tablet Take 180 mg by mouth daily.     Yes Historical Provider, MD  lisinopril (PRINIVIL,ZESTRIL) 20 MG tablet Take 1 tablet (20 mg total) by mouth daily. 03/07/14  Yes Burnell Blanks, MD  metFORMIN (GLUCOPHAGE) 500 MG tablet Take 1 tablet (500 mg total) by mouth 2 (two) times daily with a meal. Patient taking differently: Take 500 mg by mouth daily with breakfast.  03/07/14  Yes Burnell Blanks, MD  metoprolol (LOPRESSOR) 25 MG tablet Take 1 tablet (25 mg total) by mouth 2 (two) times daily. 11/07/14  Yes Liliane Shi, PA-C  Multiple Vitamin (MULTIVITAMIN) tablet Take 1 tablet by  mouth daily.     Yes Historical Provider, MD  nitroGLYCERIN (NITROSTAT) 0.4 MG SL tablet Place 0.4 mg under the tongue every 5 (five) minutes as needed for chest pain.   Yes Historical Provider, MD  potassium chloride SA (K-DUR,KLOR-CON) 20 MEQ tablet Take 20 mEq by mouth daily as needed (FOR LEG CRAMPS).   Yes Historical Provider, MD  simvastatin (ZOCOR) 40 MG tablet Take 1 tablet (40 mg total) by mouth every evening. 03/07/14  Yes Burnell Blanks, MD  vitamin E 100 UNIT capsule Take 100 Units by mouth daily.   Yes Historical Provider, MD   BP 132/61 mmHg  Pulse 148  Temp(Src) 97.4 F (36.3 C) (Oral)  Resp 18  SpO2 98% Physical Exam  Constitutional: He is oriented to person, place, and time. He appears well-developed and well-nourished. No distress.  HENT:  Head: Normocephalic and atraumatic.  Eyes: Conjunctivae and EOM are normal.  Neck: Normal range of motion.  Cardiovascular: Normal heart sounds and intact distal pulses.  An irregularly irregular rhythm present. Tachycardia present.  Exam reveals no gallop and no friction rub.   No murmur heard. Pulses:      Radial pulses are 2+ on the right side, and 2+ on the left side.  Pulmonary/Chest: Effort normal and breath sounds normal. No respiratory distress. He has no wheezes. He has no rales.  Abdominal: Soft. He exhibits no distension. There is no tenderness. There is no guarding.  Musculoskeletal: He exhibits no edema.  Neurological: He is alert and oriented to person, place, and time.  Skin: Skin is warm and dry. He is not diaphoretic.  Nursing note and vitals reviewed.   ED Course  Procedures (including critical care time) Labs Review Labs Reviewed  CBC WITH DIFFERENTIAL  [place], ED    Imaging Review No results found.   EKG Interpretation None      MDM   Final diagnoses:  None   74 year old male with a history of diabetes,  hyperlipidemia, hypertension, CAD status post multiple stents, multiple episdoes of atypical atrial flutter over the last several weeks for which she has come to the emergency department and seen his cardiologist presents with concern of heart palpitations, shortness of breath and atrial fibrillation.  Patient in atrial fibrillation with rate of 148 on arrival to the emergency department with normal blood pressures. He was a given a bolus of 15 mg of diltiazem and started on a drip.  Atrial fibrillation with RVR likely cause of his symptoms.  Pt just started on cardizem as an outpatient on Thursday. Patient converted back to sinus rhythm after receiving a bolus of diltiazem and being on the drip at 10 mg for about an hour. Discussed with cardiology and patient was given 180 mg of by mouth diltiazem and was continued on the drip for another hour. The drip was discontinued and he was monitored without any return of atrial fibrillation.  She was discharged with a prescription for Cardizem 180 mg and was recommended to follow up closely with his cardiologist. He was discharged in stable condition with understanding of reasons to return.  Gareth Morgan, MD 11/12/14 2035  Arbie Cookey, MD 11/14/14 [phone]

## 2014-11-12 NOTE — Discharge Instructions (Signed)

## 2014-11-12 NOTE — Progress Notes (Signed)
Called about patient in the ER with a-fib and RVR in 140's. Slowed with IV diltiazem and broke back into sinus. He is on Eliquis, home cardizem CD 120 mg daily and metoprolol tartrate 25 mg BID. Recommended increasing diltiazem to 180 mg CD now, overlapping with diltiazem gtts for 1 hour, then stopping drip. If he remains free of a-fib for another hour, could be discharged home. Follow-up with Richardson Dopp, PA-C or Dr. Angelena Form.  Pixie Casino, MD, [facility] Attending Cardiologist [place]

## 2014-11-12 NOTE — ED Notes (Signed)
Cardizem dose requested from Pharmacy.

## 2014-11-12 NOTE — ED Notes (Signed)
Pt c/o palpitations and CP; pt sts hx of same with afib

## 2014-11-12 NOTE — ED Provider Notes (Signed)
I saw and evaluated the patient, reviewed the resident's note and I agree with the findings and plan.   EKG Interpretation None      CRITICAL CARE Performed by: Arbie Cookey   Total critical care time: 35  Critical care time was exclusive of separately billable procedures and treating other patients.  Critical care was necessary to treat or prevent imminent or life-threatening deterioration.  Critical care was time spent personally by me on the following activities: development of treatment plan with patient and/or surrogate as well as nursing, discussions with consultants, evaluation of patient's response to treatment, examination of patient, obtaining history from patient or surrogate, ordering and performing treatments and interventions, ordering and review of laboratory studies, ordering and review of radiographic studies, pulse oximetry and re-evaluation of patient's condition.     74 yo male with hx of a fib who presents with tachycardia and palpitations.  On exam, well appearing, nontoxic, not distressed, normal respiratory effort, normal perfusion, tachycardic, grossly regular rhythm, lungs CTAB.  Initial EKG consistent with a fib with RVR.  Will require treatment with diltiazem bolus and drip.    After treatment, pt's rhythm converted to sinus.    Repeat EKG - NSR, rate 83, normal axis, normal intervals, compared to prior, sinus rhythm has replaced a fib.  (neither EKG has crossed to MUSE.)  Ultimately, dc'd home with continued outpatient treatment.    Clinical Impression: 1. Atrial fibrillation with RVR      Houston Siren III, MD 11/13/14 [phone]

## 2014-11-16 ENCOUNTER — Telehealth: Payer: Self-pay | Admitting: Cardiovascular Disease

## 2014-11-16 MED ORDER — VERAPAMIL HCL ER 180 MG PO TBCR: 180.0000 mg | EXTENDED_RELEASE_TABLET | Freq: Every day | ORAL | Status: DC

## 2014-11-16 NOTE — Telephone Encounter (Signed)
Patient tells me that he took his first dose of Diltiazem 180 mg last night and woke up this morning with hives on his legs/hands. He also tells me his eyes are itching. Reviewed with Dr. Johnsie Cancel (DOD), order to change to Verapamil 180 daily. Rx sent to [facility] per pt request.

## 2014-11-16 NOTE — Telephone Encounter (Signed)
New message     Pt c/o medication issue: 1. Name of Medication: cardizem 2. How are you currently taking this medication (dosage and times per day)? 180mg   3. Are you having a reaction (difficulty breathing--STAT)?  no 4. What is your medication issue? When pt took cardizem 120 he had to go to the ER.  When he took cardizem 180 he broke out in hives

## 2014-11-20 ENCOUNTER — Telehealth: Payer: Self-pay | Admitting: Cardiovascular Disease

## 2014-11-20 NOTE — Telephone Encounter (Signed)
Spoke with pt. He reports he has been feeling like he did prior to stent in February 2015 for last week. Chest pain off and on that goes away with rest.  Last episode earlier today. No episodes of rapid heart beat.  Is feeling OK at this time.  He has appt with Richardson Dopp, PA tomorrow and I encouraged him to discuss these symptoms with PA.  Pt aware if symptoms worsen prior to this appt he should call EMS or go to ED.

## 2014-11-20 NOTE — Telephone Encounter (Signed)
New message    Patient calling went to emergency the last two Monday ago .  Have some concerns. Has appt on 1.20.2016 @ 3 pm.    No chest pain.     C/O Patient feel like he has a blockage somewhere. Related to the same symptoms prior to cath procedure in  Feb.

## 2014-11-21 ENCOUNTER — Encounter: Payer: Self-pay | Admitting: *Deleted

## 2014-11-21 ENCOUNTER — Encounter: Payer: Self-pay | Admitting: Physician Assistant

## 2014-11-21 ENCOUNTER — Ambulatory Visit (INDEPENDENT_AMBULATORY_CARE_PROVIDER_SITE_OTHER): Payer: BLUE CROSS/BLUE SHIELD | Admitting: Physician Assistant

## 2014-11-21 VITALS — BP 140/70 | HR 66 | Ht 71.0 in | Wt 196.0 lb

## 2014-11-21 DIAGNOSIS — I208 Other forms of angina pectoris: Secondary | ICD-10-CM

## 2014-11-21 DIAGNOSIS — R079 Chest pain, unspecified: Secondary | ICD-10-CM | POA: Diagnosis not present

## 2014-11-21 DIAGNOSIS — E785 Hyperlipidemia, unspecified: Secondary | ICD-10-CM

## 2014-11-21 DIAGNOSIS — E119 Type 2 diabetes mellitus without complications: Secondary | ICD-10-CM

## 2014-11-21 DIAGNOSIS — I25119 Atherosclerotic heart disease of native coronary artery with unspecified angina pectoris: Secondary | ICD-10-CM | POA: Diagnosis not present

## 2014-11-21 DIAGNOSIS — I10 Essential (primary) hypertension: Secondary | ICD-10-CM

## 2014-11-21 DIAGNOSIS — I2089 Other forms of angina pectoris: Secondary | ICD-10-CM

## 2014-11-21 DIAGNOSIS — I480 Paroxysmal atrial fibrillation: Secondary | ICD-10-CM

## 2014-11-21 MED ORDER — ISOSORBIDE MONONITRATE ER 30 MG PO TB24
15.0000 mg | ORAL_TABLET | Freq: Every day | ORAL | Status: DC
Start: 2014-11-21 — End: 2014-11-29

## 2014-11-21 NOTE — Telephone Encounter (Signed)
Agree. thanks

## 2014-11-21 NOTE — H&P (Signed)
History and Physical   Date:  11/21/2014   ID:  Gregory Bates, DOB [birth year], MRN [id number]  PCP:  PROVIDER NOT IN SYSTEM  Cardiologist:  Dr. Lauree Chandler     Chief Complaint  Patient presents with  . Atrial Fibrillation  . Chest Pain  . Coronary Artery Disease    History of Present Illness: Gregory Bates is a 74 y.o. male who presents for FU on the above.   He has a hx of CAD status post inferior STEMI in 10/2012 treated with a DES to the RCA, non-STEMI in 01/2013 treated with a DES to the OM1, and unstable angina in 12/2013 treated with a DES to the distal RCA. Other history includes DM2, HTN, HL, paroxysmal atrial flutter. He is on a apixaban for anticoagulation. Last seen by Dr. Angelena Form 09/2014. He presented to the emergency room 11/05/14 with chest pain in the setting of atrial fibrillation with RVR. He spontaneously converted to NSR. Cardiac markers remained normal. He called the office and noted several episodes of rapid heart rates in the recent past.    I saw him in follow-up 11/07/14. We discussed the options for controlling his rhythm. These included adding diltiazem versus antiarrhythmic therapy (amiodarone, Tikosyn, sotalol or Multaq). I ultimately chose to place him on Cardizem CD 120 mg daily.  He went back to the emergency room on 11/12/14 with recurrent atrial fibrillation with RVR. He converted to NSR with diltiazem IV bolus.  The case was reviewed with Dr. Debara Pickett by telephone. Recommendation was to increase diltiazem to 180 mg daily. He called in yesterday noting some episodes of chest discomfort. He returns for follow-up.  The patient tells me that he is experiencing symptoms like he had last January and February prior to his PCI. He has left chest heaviness with exertion. This resolves with rest. He has associated dyspnea as well as bilateral arm numbness. His symptoms occur with fairly minimal activity. Overall, he describes CCS class 2-3 symptoms. He denies  syncope. He denies orthopnea, PND or edema.   Studies Reviewed Today: - Echo (1/15): Mild LVH, EF 55%, inferolateral hypokinesis, grade 1 diastolic dysfunction, mild AI, trivial MR, moderate LAE, PASP 36 mmHg  - LHC (2/15): dLM 20, oLAD 30, D1 with tandem 70, pOM1 stent ok w/ mid 20, both vessels dOM bifurc 50, AVCFX 60-70, pRCA 40, mRCA stent ok, dRCA 99 on prox edge of old stent, PL Br 50, PDA Br 60 followed by 80, EF 40% with inf HK >> PCI: Promus DES to dist RCA - Stress myoview 08/07/13 with LVEF 44%, inferior scar, no ischemia   Past Medical History  Diagnosis Date  . CAD (coronary artery disease), native coronary artery     a. s/p cath May 2011 >> DES distal RCA;  b. 01/2013 NSTEMI >> OM1 99p (2.75x12 Promus Premier DES);  c. LHC (2/15): dLM 20, oLAD 30, D1 with tandem 70, pOM1 stent ok w/ mid 50, both vessels dOM bifurc 50, AVCFX 60-70, pRCA 40, mRCA stent ok, dRCA 99 on prox edge of old stent, PL Br 50, PDA Br 60 followed by 80, EF 40% with inf HK >> PCI: Promus DES to dist RCA   . Hypertension   . Hyperlipidemia   . Diabetes mellitus     AODM  . Anemia, unspecified   . Colorectal polyps 2002  . Erectile dysfunction   . Diastolic dysfunction     a. 01/2013 Echo: EF 55%, mid-dist inflat HK, Gr1 DD, Lurlean Nanny  AI/TR, Mild MR.;  b.  Echo (1/15): Mild LVH, EF 55%, inferolateral hypokinesis, grade 1 diastolic dysfunction, mild AI, trivial MR, moderate LAE, PASP 36 mmHg   . Atrial flutter     a. Dx 11/2013. Spont conv. Placed on eliquis.  . Acute renal insufficiency     a. During 11/2013 adm with atrial flutter.  . Myocardial infarction   . Hx of cardiovascular stress test     Stress myoview 08/07/13 with LVEF 44%, inferior scar, no ischemia    Past Surgical History  Procedure Laterality Date  . Wisdom tooth extraction    . Colonoscopy w/ polypectomy  2002    [facility]  . Infantile paralysis facial asymmetry    . Coronary stent placement  2007, 2011  . Colonoscopy w/ polypectomy   2004; 2007 negative  . Coronary angioplasty  12/26/2013  . Left heart catheterization with coronary angiogram N/A 10/02/2012    Procedure: LEFT HEART CATHETERIZATION WITH CORONARY ANGIOGRAM;  Surgeon: Peter M Martinique, MD;  Location: [facility] CATH LAB;  Service: Cardiovascular;  Laterality: N/A;  . Percutaneous coronary stent intervention (pci-s)  10/02/2012    Procedure: PERCUTANEOUS CORONARY STENT INTERVENTION (PCI-S);  Surgeon: Peter M Martinique, MD;  Location: [facility] CATH LAB;  Service: Cardiovascular;;  . Left heart catheterization with coronary angiogram N/A 02/20/2013    Procedure: LEFT HEART CATHETERIZATION WITH CORONARY ANGIOGRAM;  Surgeon: Burnell Blanks, MD;  Location: [facility] CATH LAB;  Service: Cardiovascular;  Laterality: N/A;  . Percutaneous coronary stent intervention (pci-s)  02/20/2013    Procedure: PERCUTANEOUS CORONARY STENT INTERVENTION (PCI-S);  Surgeon: Burnell Blanks, MD;  Location: [facility] CATH LAB;  Service: Cardiovascular;;  . Left heart catheterization with coronary angiogram N/A 12/26/2013    Procedure: LEFT HEART CATHETERIZATION WITH CORONARY ANGIOGRAM;  Surgeon: Burnell Blanks, MD;  Location: [facility] CATH LAB;  Service: Cardiovascular;  Laterality: N/A;  . Percutaneous coronary stent intervention (pci-s)  12/26/2013    Procedure: PERCUTANEOUS CORONARY STENT INTERVENTION (PCI-S);  Surgeon: Burnell Blanks, MD;  Location: [facility] CATH LAB;  Service: Cardiovascular;;     Current Outpatient Prescriptions  Medication Sig Dispense Refill  . apixaban (ELIQUIS) 5 MG TABS tablet Take 1 tablet (5 mg total) by mouth 2 (two) times daily. 60 tablet 11  . calcium carbonate (OS-CAL) 600 MG TABS tablet Take 600 mg by mouth daily with breakfast.     . cholecalciferol (VITAMIN D) 1000 UNITS tablet Take 1,000 Units by mouth daily.    . clopidogrel (PLAVIX) 75 MG tablet Take 1 tablet (75 mg total) by mouth daily with breakfast. 90 tablet 3  . fexofenadine (ALLEGRA) 180 MG tablet Take 180 mg by  mouth daily.      [name] Kitchen lisinopril (PRINIVIL,ZESTRIL) 20 MG tablet Take 1 tablet (20 mg total) by mouth daily. 90 tablet 3  . metFORMIN (GLUCOPHAGE) 500 MG tablet Take 1 tablet (500 mg total) by mouth 2 (two) times daily with a meal. (Patient taking differently: Take 500 mg by mouth daily with breakfast. ) 180 tablet 3  . metoprolol (LOPRESSOR) 25 MG tablet Take 1 tablet (25 mg total) by mouth 2 (two) times daily. 60 tablet 11  . Multiple Vitamin (MULTIVITAMIN) tablet Take 1 tablet by mouth daily.      . nitroGLYCERIN (NITROSTAT) 0.4 MG SL tablet Place 0.4 mg under the tongue every 5 (five) minutes as needed for chest pain.    . potassium chloride SA (K-DUR,KLOR-CON) 20 MEQ tablet Take 20 mEq by mouth daily as needed (  FOR LEG CRAMPS).    . simvastatin (ZOCOR) 40 MG tablet Take 1 tablet (40 mg total) by mouth every evening. 90 tablet 3  . verapamil (CALAN-SR) 180 MG CR tablet Take 1 tablet (180 mg total) by mouth at bedtime. 30 tablet 1  . vitamin E 100 UNIT capsule Take 100 Units by mouth daily.     No current facility-administered medications for this visit.    Allergies:   Brilinta; Crestor; and Lipitor    Social History:  The patient  reports that he quit smoking about 30 years ago. He has never used smokeless tobacco. He reports that he does not drink alcohol or use illicit drugs.   Family History:  The patient's family history includes Colon cancer in his brother; Heart attack (age of onset: 32) in his brother. There is no history of Diabetes, Stroke, or Hypertension.    ROS:  Please see the history of present illness.   Otherwise, review of systems are positive for rash from the Diltiazem - resolved now.   All other systems are reviewed and negative.    PHYSICAL EXAM: VS:  BP 140/70 mmHg  Pulse 66  Ht 5\' 11"  (1.803 m)  Wt 196 lb (88.905 kg)  BMI 27.35 kg/m2 , BMI Body mass index is 27.35 kg/(m^2). GEN: Well nourished, well developed, in no acute distress HEENT: normal Neck: no JVD,  no carotid bruits, or masses Cardiac:  RRR; no murmurs, rubs, or gallops,no edema  Respiratory:  clear to auscultation bilaterally, no wheezing, rhonchi or rales. GI: soft, nontender, nondistended, + BS MS: no deformity or atrophy Skin: warm and dry  Neuro:  Strength and sensation are intact Psych: Normal affect  Wt Readings from Last 3 Encounters:  11/21/14 196 lb (88.905 kg)  11/07/14 190 lb 12.8 oz (86.546 kg)  09/20/14 193 lb (87.544 kg)      EKG:  EKG is ordered today.  It demonstrates:   NSR, HR 66, inferior Q waves, NSSTTW changes, no significant change compared to prior tracings   Recent Labs: 12/24/2013: ALT 16 11/07/2014: TSH 0.74 11/12/2014: B Natriuretic Peptide 111.9*; BUN 19; Creatinine 1.30; Hemoglobin 14.1; Platelets 219; Potassium 4.3; Sodium 136    Lipid Panel    Component Value Date/Time   CHOL 170 11/04/2013 0617   TRIG 166* 11/04/2013 0617   HDL 34* 11/04/2013 0617   CHOLHDL 5.0 11/04/2013 0617   VLDL 33 11/04/2013 0617   LDLCALC 103* 11/04/2013 0617   LDLDIRECT 69.3 11/26/2008 1256     ASSESSMENT AND PLAN:  1.  Chest Pain: Patient presents today with complaints of exertional chest discomfort in shortness of breath. This is reminiscent of his previous angina. He describes CCS class 2-3 symptoms. His symptoms remind him of what he had one year ago when he underwent PCI to the RCA. He is also having symptoms of atrial fibrillation associated with this chest discomfort. He has not taken any nitroglycerin.    -  Proceed with cardiac catheterization to further evaluate his symptoms.  Risks and benefits of cardiac catheterization have been discussed with the patient.  These include bleeding, infection, kidney damage, stroke, heart attack, death.  The patient understands these risks and is willing to proceed.     -  Add Imdur 15 mg QD. 2.  Paroxysmal Atrial Fibrillation:  Currently maintaining sinus rhythm. He is tolerating Eliquis without bleeding complications.  He had a rash from Cardizem CD 180 mg daily. This is been changed to verapamil.  He is  currently tolerating metoprolol and verapamil. If cardiac catheterization demonstrates stable anatomy, consider initiating rhythm control.    -  Hold Eliquis 3 doses prior to cardiac catheterization. 3.  Coronary Artery Disease:  As noted, he has chest symptoms consistent with CCS class 2-3 angina. Continue Plavix, statin, beta blocker. Add nitrates as noted. Proceed with cardiac catheterization as noted. He knows to go to the emergency room if he has recurrent or worsening symptoms. 4.  Hypertension: Borderline control. Add nitrates as outlined above. 5.  Hyperlipidemia: Continue simvastatin. He appears to be tolerating this. 6.  Diabetes Mellitus: Metformin will be held for 24 hours prior to and 48 hours after his cardiac catheterization.   Current medicines are reviewed at length with the patient today.  The patient does not have concerns regarding medicines.  The following changes have been made:  As noted above.    Labs/ tests ordered today include:  Orders Placed This Encounter  Procedures  . Basic Metabolic Panel (BMET)  . CBC w/Diff  . INR/PT  . EKG 12-Lead    Disposition:   FU with Dr. Lauree Chandler or me in 2 weeks after his cath.     Signed, Versie Starks, MHS 11/21/2014 3:40 PM    [facility] [place], [place], [place]  [address] Phone: [phone]; Fax: [phone]

## 2014-11-21 NOTE — Telephone Encounter (Signed)
Patient set up for cath today.  See OV note. Richardson Dopp, PA-C   11/21/2014 5:49 PM

## 2014-11-21 NOTE — Patient Instructions (Signed)
Your physician has requested that you have a cardiac catheterization. Cardiac catheterization is used to diagnose and/or treat various heart conditions. Doctors may recommend this procedure for a number of different reasons. The most common reason is to evaluate chest pain. Chest pain can be a symptom of coronary artery disease (CAD), and cardiac catheterization can show whether plaque is narrowing or blocking your heart's arteries. This procedure is also used to evaluate the valves, as well as measure the blood flow and oxygen levels in different parts of your heart. For further information please visit [email or web address]. Please follow instruction sheet, as given.  Your physician recommends that you return for lab work in: [place]; BMET, CBC W/DIFF, PT/INR  Your physician has recommended you make the following change in your medication:  START IMDUR 15 MG DAILY; THIS WILL BE 1/2 TABLET DAILY OF THE 30 MG TABLET; RX SENT IN TODAY  YOU HAVE A FOLLOW UP WITH [address] [place]

## 2014-11-21 NOTE — Progress Notes (Signed)
Cardiology Office Note   Date:  11/21/2014   ID:  Gregory Bates, DOB Jan 04, 1941, MRN [id number]  PCP:  PROVIDER NOT IN SYSTEM  Cardiologist:  Dr. Lauree Chandler     Chief Complaint  Patient presents with  . Atrial Fibrillation  . Chest Pain  . Coronary Artery Disease    History of Present Illness: Gregory Bates is a 74 y.o. male who presents for FU on the above.   He has a hx of CAD status post inferior STEMI in 10/2012 treated with a DES to the RCA, non-STEMI in 01/2013 treated with a DES to the OM1, and unstable angina in 12/2013 treated with a DES to the distal RCA. Other history includes DM2, HTN, HL, paroxysmal atrial flutter. He is on a apixaban for anticoagulation. Last seen by Dr. Angelena Form 09/2014. He presented to the emergency room 11/05/14 with chest pain in the setting of atrial fibrillation with RVR. He spontaneously converted to NSR. Cardiac markers remained normal. He called the office and noted several episodes of rapid heart rates in the recent past.    I saw him in follow-up 11/07/14. We discussed the options for controlling his rhythm. These included adding diltiazem versus antiarrhythmic therapy (amiodarone, Tikosyn, sotalol or Multaq). I ultimately chose to place him on Cardizem CD 120 mg daily.  He went back to the emergency room on 11/12/14 with recurrent atrial fibrillation with RVR. He converted to NSR with diltiazem IV bolus.  The case was reviewed with Dr. Debara Pickett by telephone. Recommendation was to increase diltiazem to 180 mg daily. He called in yesterday noting some episodes of chest discomfort. He returns for follow-up.  The patient tells me that he is experiencing symptoms like he had last January and February prior to his PCI. He has left chest heaviness with exertion. This resolves with rest. He has associated dyspnea as well as bilateral arm numbness. His symptoms occur with fairly minimal activity. Overall, he describes CCS class 2-3 symptoms. He denies  syncope. He denies orthopnea, PND or edema.   Studies Reviewed Today: - Echo (1/15): Mild LVH, EF 55%, inferolateral hypokinesis, grade 1 diastolic dysfunction, mild AI, trivial MR, moderate LAE, PASP 36 mmHg  - LHC (2/15): dLM 20, oLAD 30, D1 with tandem 70, pOM1 stent ok w/ mid 3, both vessels dOM bifurc 50, AVCFX 60-70, pRCA 40, mRCA stent ok, dRCA 99 on prox edge of old stent, PL Br 50, PDA Br 60 followed by 80, EF 40% with inf HK >> PCI: Promus DES to dist RCA - Stress myoview 08/07/13 with LVEF 44%, inferior scar, no ischemia   Past Medical History  Diagnosis Date  . CAD (coronary artery disease), native coronary artery     a. s/p cath May 2011 >> DES distal RCA;  b. 01/2013 NSTEMI >> OM1 99p (2.75x12 Promus Premier DES);  c. LHC (2/15): dLM 20, oLAD 30, D1 with tandem 70, pOM1 stent ok w/ mid 50, both vessels dOM bifurc 50, AVCFX 60-70, pRCA 40, mRCA stent ok, dRCA 99 on prox edge of old stent, PL Br 50, PDA Br 60 followed by 80, EF 40% with inf HK >> PCI: Promus DES to dist RCA   . Hypertension   . Hyperlipidemia   . Diabetes mellitus     AODM  . Anemia, unspecified   . Colorectal polyps 2002  . Erectile dysfunction   . Diastolic dysfunction     a. 01/2013 Echo: EF 55%, mid-dist inflat HK, Gr1 DD, Lurlean Nanny  AI/TR, Mild MR.;  b.  Echo (1/15): Mild LVH, EF 55%, inferolateral hypokinesis, grade 1 diastolic dysfunction, mild AI, trivial MR, moderate LAE, PASP 36 mmHg   . Atrial flutter     a. Dx 11/2013. Spont conv. Placed on eliquis.  . Acute renal insufficiency     a. During 11/2013 adm with atrial flutter.  . Myocardial infarction   . Hx of cardiovascular stress test     Stress myoview 08/07/13 with LVEF 44%, inferior scar, no ischemia    Past Surgical History  Procedure Laterality Date  . Wisdom tooth extraction    . Colonoscopy w/ polypectomy  2002    [facility]  . Infantile paralysis facial asymmetry    . Coronary stent placement  2007, 2011  . Colonoscopy w/ polypectomy   2004; 2007 negative  . Coronary angioplasty  12/26/2013  . Left heart catheterization with coronary angiogram N/A 10/02/2012    Procedure: LEFT HEART CATHETERIZATION WITH CORONARY ANGIOGRAM;  Surgeon: Peter M Martinique, MD;  [facility] CATH LAB;  Service: Cardiovascular;  Laterality: N/A;  . Percutaneous coronary stent intervention (pci-s)  10/02/2012    Procedure: PERCUTANEOUS CORONARY STENT INTERVENTION (PCI-S);  Surgeon: Peter M Martinique, MD;  Location[facility] CATH LAB;  Service: Cardiovascular;;  . Left heart catheterization with coronary angiogram N/A 02/20/2013    Procedure: LEFT HEART CATHETERIZATION WITH CORONARY ANGIOGRAM;  Surgeon: Burnell Blanks, MD;  Location: [facility] CATH LAB;  Service: Cardiovascular;  Laterality: N/A;  . Percutaneous coronary stent intervention (pci-s)  02/20/2013    Procedure: PERCUTANEOUS CORONARY STENT INTERVENTION (PCI-S);  Surgeon: Burnell Blanks, MD;  Location: [facility] CATH LAB;  Service: Cardiovascular;;  . Left heart catheterization with coronary angiogram N/A 12/26/2013    Procedure: LEFT HEART CATHETERIZATION WITH CORONARY ANGIOGRAM;  Surgeon: Burnell Blanks, MD;  Location: [facility] CATH LAB;  Service: Cardiovascular;  Laterality: N/A;  . Percutaneous coronary stent intervention (pci-s)  12/26/2013    Procedure: PERCUTANEOUS CORONARY STENT INTERVENTION (PCI-S);  Surgeon: Burnell Blanks, MD;  Location: [facility] CATH LAB;  Service: Cardiovascular;;     Current Outpatient Prescriptions  Medication Sig Dispense Refill  . apixaban (ELIQUIS) 5 MG TABS tablet Take 1 tablet (5 mg total) by mouth 2 (two) times daily. 60 tablet 11  . calcium carbonate (OS-CAL) 600 MG TABS tablet Take 600 mg by mouth daily with breakfast.     . cholecalciferol (VITAMIN D) 1000 UNITS tablet Take 1,000 Units by mouth daily.    . clopidogrel (PLAVIX) 75 MG tablet Take 1 tablet (75 mg total) by mouth daily with breakfast. 90 tablet 3  . fexofenadine (ALLEGRA) 180 MG tablet Take 180 mg by  mouth daily.      [name] Kitchen lisinopril (PRINIVIL,ZESTRIL) 20 MG tablet Take 1 tablet (20 mg total) by mouth daily. 90 tablet 3  . metFORMIN (GLUCOPHAGE) 500 MG tablet Take 1 tablet (500 mg total) by mouth 2 (two) times daily with a meal. (Patient taking differently: Take 500 mg by mouth daily with breakfast. ) 180 tablet 3  . metoprolol (LOPRESSOR) 25 MG tablet Take 1 tablet (25 mg total) by mouth 2 (two) times daily. 60 tablet 11  . Multiple Vitamin (MULTIVITAMIN) tablet Take 1 tablet by mouth daily.      . nitroGLYCERIN (NITROSTAT) 0.4 MG SL tablet Place 0.4 mg under the tongue every 5 (five) minutes as needed for chest pain.    . potassium chloride SA (K-DUR,KLOR-CON) 20 MEQ tablet Take 20 mEq by mouth daily as needed (  FOR LEG CRAMPS).    . simvastatin (ZOCOR) 40 MG tablet Take 1 tablet (40 mg total) by mouth every evening. 90 tablet 3  . verapamil (CALAN-SR) 180 MG CR tablet Take 1 tablet (180 mg total) by mouth at bedtime. 30 tablet 1  . vitamin E 100 UNIT capsule Take 100 Units by mouth daily.     No current facility-administered medications for this visit.    Allergies:   Brilinta; Crestor; and Lipitor    Social History:  The patient  reports that he quit smoking about 30 years ago. He has never used smokeless tobacco. He reports that he does not drink alcohol or use illicit drugs.   Family History:  The patient's family history includes Colon cancer in his brother; Heart attack (age of onset: 26) in his brother. There is no history of Diabetes, Stroke, or Hypertension.    ROS:  Please see the history of present illness.   Otherwise, review of systems are positive for rash from the Diltiazem - resolved now.   All other systems are reviewed and negative.    PHYSICAL EXAM: VS:  BP 140/70 mmHg  Pulse 66  Ht 5\' 11"  (1.803 m)  Wt 196 lb (88.905 kg)  BMI 27.35 kg/m2 , BMI Body mass index is 27.35 kg/(m^2). GEN: Well nourished, well developed, in no acute distress HEENT: normal Neck: no JVD,  no carotid bruits, or masses Cardiac:  RRR; no murmurs, rubs, or gallops,no edema  Respiratory:  clear to auscultation bilaterally, no wheezing, rhonchi or rales. GI: soft, nontender, nondistended, + BS MS: no deformity or atrophy Skin: warm and dry  Neuro:  Strength and sensation are intact Psych: Normal affect  Wt Readings from Last 3 Encounters:  11/21/14 196 lb (88.905 kg)  11/07/14 190 lb 12.8 oz (86.546 kg)  09/20/14 193 lb (87.544 kg)      EKG:  EKG is ordered today.  It demonstrates:   NSR, HR 66, inferior Q waves, NSSTTW changes, no significant change compared to prior tracings   Recent Labs: 12/24/2013: ALT 16 11/07/2014: TSH 0.74 11/12/2014: B Natriuretic Peptide 111.9*; BUN 19; Creatinine 1.30; Hemoglobin 14.1; Platelets 219; Potassium 4.3; Sodium 136    Lipid Panel    Component Value Date/Time   CHOL 170 11/04/2013 0617   TRIG 166* 11/04/2013 0617   HDL 34* 11/04/2013 0617   CHOLHDL 5.0 11/04/2013 0617   VLDL 33 11/04/2013 0617   LDLCALC 103* 11/04/2013 0617   LDLDIRECT 69.3 11/26/2008 1256     ASSESSMENT AND PLAN:  1.  Chest Pain: Patient presents today with complaints of exertional chest discomfort in shortness of breath. This is reminiscent of his previous angina. He describes CCS class 2-3 symptoms. His symptoms remind him of what he had one year ago when he underwent PCI to the RCA. He is also having symptoms of atrial fibrillation associated with this chest discomfort. He has not taken any nitroglycerin.    -  Proceed with cardiac catheterization to further evaluate his symptoms.  Risks and benefits of cardiac catheterization have been discussed with the patient.  These include bleeding, infection, kidney damage, stroke, heart attack, death.  The patient understands these risks and is willing to proceed.     -  Add Imdur 15 mg QD. 2.  Paroxysmal Atrial Fibrillation:  Currently maintaining sinus rhythm. He is tolerating Eliquis without bleeding complications.  He had a rash from Cardizem CD 180 mg daily. This is been changed to verapamil.  He is  currently tolerating metoprolol and verapamil. If cardiac catheterization demonstrates stable anatomy, consider initiating rhythm control.    -  Hold Eliquis 3 doses prior to cardiac catheterization. 3.  Coronary Artery Disease:  As noted, he has chest symptoms consistent with CCS class 2-3 angina. Continue Plavix, statin, beta blocker. Add nitrates as noted. Proceed with cardiac catheterization as noted. He knows to go to the emergency room if he has recurrent or worsening symptoms. 4.  Hypertension: Borderline control. Add nitrates as outlined above. 5.  Hyperlipidemia: Continue simvastatin. He appears to be tolerating this. 6.  Diabetes Mellitus: Metformin will be held for 24 hours prior to and 48 hours after his cardiac catheterization.   Current medicines are reviewed at length with the patient today.  The patient does not have concerns regarding medicines.  The following changes have been made:  As noted above.    Labs/ tests ordered today include:  Orders Placed This Encounter  Procedures  . Basic Metabolic Panel (BMET)  . CBC w/Diff  . INR/PT  . EKG 12-Lead    Disposition:   FU with Dr. Lauree Chandler or me in 2 weeks after his cath.     Signed, Versie Starks, MHS 11/21/2014 3:40 PM    [facility] [place], [place], [place]  [address] Phone: [phone]; Fax: [phone]

## 2014-11-22 ENCOUNTER — Telehealth: Payer: Self-pay

## 2014-11-22 LAB — CBC WITH DIFFERENTIAL/PLATELET
BASOS ABS: 0 10*3/uL (ref 0.0–0.1)
Basophils Relative: 0.3 % (ref 0.0–3.0)
Eosinophils Absolute: 0.4 10*3/uL (ref 0.0–0.7)
Eosinophils Relative: 5.1 % — ABNORMAL HIGH (ref 0.0–5.0)
HCT: 36 % — ABNORMAL LOW (ref 39.0–52.0)
Hemoglobin: 12.4 g/dL — ABNORMAL LOW (ref 13.0–17.0)
LYMPHS ABS: 2.1 10*3/uL (ref 0.7–4.0)
LYMPHS PCT: 25.8 % (ref 12.0–46.0)
MCHC: 34.5 g/dL (ref 30.0–36.0)
MCV: 87.5 fl (ref 78.0–100.0)
MONO ABS: 0.7 10*3/uL (ref 0.1–1.0)
MONOS PCT: 8.6 % (ref 3.0–12.0)
Neutro Abs: 5 10*3/uL (ref 1.4–7.7)
Neutrophils Relative %: 60.2 % (ref 43.0–77.0)
Platelets: 212 10*3/uL (ref 150.0–400.0)
RBC: 4.11 Mil/uL — ABNORMAL LOW (ref 4.22–5.81)
RDW: 13 % (ref 11.5–15.5)
WBC: 8.3 10*3/uL (ref 4.0–10.5)

## 2014-11-22 LAB — BASIC METABOLIC PANEL
BUN: 18 mg/dL (ref 6–23)
CALCIUM: 9.6 mg/dL (ref 8.4–10.5)
CO2: 28 mEq/L (ref 19–32)
Chloride: 103 mEq/L (ref 96–112)
Creatinine, Ser: 1.43 mg/dL (ref 0.40–1.50)
GFR: 51.37 mL/min — AB (ref >60.00)
Glucose, Bld: 120 mg/dL — ABNORMAL HIGH (ref 70–99)
Potassium: 4.1 mEq/L (ref 3.5–5.1)
Sodium: 138 mEq/L (ref 135–145)

## 2014-11-22 LAB — PROTIME-INR
INR: 1.3 ratio — ABNORMAL HIGH (ref 0.8–1.0)
PROTHROMBIN TIME: 14 s — AB (ref 9.6–13.1)

## 2014-11-22 NOTE — Telephone Encounter (Signed)
spoke with pt about lab results. pt aware that his labs were okay and good for his cath. pt verbalized understanding.

## 2014-11-28 ENCOUNTER — Encounter (HOSPITAL_COMMUNITY)
Admission: RE | Disposition: A | Discharge status: Home / Self Care | Payer: Self-pay | Source: Ambulatory Visit | Attending: Cardiovascular Disease

## 2014-11-28 ENCOUNTER — Ambulatory Visit (HOSPITAL_COMMUNITY)
Admission: RE | Admit: 2014-11-28 | Discharge: 2014-11-28 | Disposition: A | Discharge status: Home / Self Care | Payer: BLUE CROSS/BLUE SHIELD | Source: Ambulatory Visit | Attending: Cardiovascular Disease | Admitting: Cardiovascular Disease

## 2014-11-28 ENCOUNTER — Encounter (HOSPITAL_COMMUNITY): Payer: Self-pay | Admitting: Cardiovascular Disease

## 2014-11-28 DIAGNOSIS — Z79899 Other long term (current) drug therapy: Secondary | ICD-10-CM | POA: Diagnosis not present

## 2014-11-28 DIAGNOSIS — Z87891 Personal history of nicotine dependence: Secondary | ICD-10-CM | POA: Insufficient documentation

## 2014-11-28 DIAGNOSIS — I480 Paroxysmal atrial fibrillation: Secondary | ICD-10-CM | POA: Diagnosis not present

## 2014-11-28 DIAGNOSIS — I25110 Atherosclerotic heart disease of native coronary artery with unstable angina pectoris: Secondary | ICD-10-CM | POA: Diagnosis present

## 2014-11-28 DIAGNOSIS — E785 Hyperlipidemia, unspecified: Secondary | ICD-10-CM | POA: Diagnosis not present

## 2014-11-28 DIAGNOSIS — I252 Old myocardial infarction: Secondary | ICD-10-CM | POA: Insufficient documentation

## 2014-11-28 DIAGNOSIS — E119 Type 2 diabetes mellitus without complications: Secondary | ICD-10-CM | POA: Diagnosis not present

## 2014-11-28 DIAGNOSIS — I10 Essential (primary) hypertension: Secondary | ICD-10-CM | POA: Diagnosis not present

## 2014-11-28 DIAGNOSIS — I2510 Atherosclerotic heart disease of native coronary artery without angina pectoris: Secondary | ICD-10-CM | POA: Diagnosis present

## 2014-11-28 DIAGNOSIS — I208 Other forms of angina pectoris: Secondary | ICD-10-CM

## 2014-11-28 DIAGNOSIS — I25119 Atherosclerotic heart disease of native coronary artery with unspecified angina pectoris: Secondary | ICD-10-CM

## 2014-11-28 HISTORY — PX: LEFT HEART CATHETERIZATION WITH CORONARY ANGIOGRAM: SHX5451

## 2014-11-28 LAB — BASIC METABOLIC PANEL
Anion gap: 11 (ref 5–15)
BUN: 19 mg/dL (ref 6–23)
CO2: 26 mmol/L (ref 19–32)
Calcium: 9.6 mg/dL (ref 8.4–10.5)
Chloride: 102 mmol/L (ref 96–112)
Creatinine, Ser: 1.31 mg/dL (ref 0.50–1.35)
GFR calc Af Amer: 60 mL/min — ABNORMAL LOW (ref >90)
GFR, EST NON AFRICAN AMERICAN: 52 mL/min — AB (ref >90)
Glucose, Bld: 139 mg/dL — ABNORMAL HIGH (ref 70–99)
Potassium: 4 mmol/L (ref 3.5–5.1)
Sodium: 139 mmol/L (ref 135–145)

## 2014-11-28 SURGERY — LEFT HEART CATHETERIZATION WITH CORONARY ANGIOGRAM

## 2014-11-28 MED ORDER — NITROGLYCERIN 1 MG/10 ML FOR IR/CATH LAB
INTRA_ARTERIAL | Status: AC
  Filled 2014-11-28: qty 10

## 2014-11-28 MED ORDER — FENTANYL CITRATE 0.05 MG/ML IJ SOLN
INTRAMUSCULAR | Status: AC
  Filled 2014-11-28: qty 2

## 2014-11-28 MED ORDER — ASPIRIN 81 MG PO CHEW
81.0000 mg | CHEWABLE_TABLET | ORAL | Status: AC
  Administered 2014-11-28: 81 mg via ORAL

## 2014-11-28 MED ORDER — LIDOCAINE HCL (PF) 1 % IJ SOLN
INTRAMUSCULAR | Status: AC
  Filled 2014-11-28: qty 30

## 2014-11-28 MED ORDER — SODIUM CHLORIDE 0.9 % IJ SOLN: 3.0000 mL | INTRAMUSCULAR | Status: DC | PRN

## 2014-11-28 MED ORDER — VERAPAMIL HCL 2.5 MG/ML IV SOLN
INTRAVENOUS | Status: AC
  Filled 2014-11-28: qty 2

## 2014-11-28 MED ORDER — ASPIRIN 81 MG PO CHEW
CHEWABLE_TABLET | ORAL | Status: AC
  Filled 2014-11-28: qty 1

## 2014-11-28 MED ORDER — MIDAZOLAM HCL 2 MG/2ML IJ SOLN
INTRAMUSCULAR | Status: AC
  Filled 2014-11-28: qty 2

## 2014-11-28 MED ORDER — SODIUM CHLORIDE 0.9 % IV SOLN
INTRAVENOUS | Status: DC
  Administered 2014-11-28: 09:00:00 via INTRAVENOUS

## 2014-11-28 MED ORDER — SODIUM CHLORIDE 0.9 % IJ SOLN: 3.0000 mL | Freq: Two times a day (BID) | INTRAMUSCULAR | Status: DC

## 2014-11-28 MED ORDER — SODIUM CHLORIDE 0.9 % IV SOLN: INTRAVENOUS | Status: AC

## 2014-11-28 MED ORDER — SODIUM CHLORIDE 0.9 % IV SOLN: 250.0000 mL | INTRAVENOUS | Status: DC | PRN

## 2014-11-28 MED ORDER — HEPARIN (PORCINE) IN NACL 2-0.9 UNIT/ML-% IJ SOLN
INTRAMUSCULAR | Status: AC
  Filled 2014-11-28: qty 1000

## 2014-11-28 MED ORDER — HEPARIN SODIUM (PORCINE) 1000 UNIT/ML IJ SOLN
INTRAMUSCULAR | Status: AC
  Filled 2014-11-28: qty 1

## 2014-11-28 NOTE — CV Procedure (Signed)
Cardiac Catheterization Operative Report  Gregory Bates [id number] 1/27/201610:51 AM PROVIDER NOT IN SYSTEM  Procedure Performed:  1. Left Heart Catheterization 2. Selective Coronary Angiography 3. Left ventricular angiogram  Operator: Lauree Chandler, MD  Arterial access site:  Right radial artery.   Indication:  74 yo male with history of DM, HTN, HLD, CAD with multiple prior PCI/stents in the RCA and OM with recent chest pain c/w unstable angina.                                      Procedure Details: The risks, benefits, complications, treatment options, and expected outcomes were discussed with the patient. The patient and/or family concurred with the proposed plan, giving informed consent. The patient was brought to the cath lab after IV hydration was begun and oral premedication was given. The patient was further sedated with Versed and Fentanyl. The right wrist was assessed with a modified Allens test which was positive. The right wrist was prepped and draped in a sterile fashion. 1% lidocaine was used for local anesthesia. Using the modified Seldinger access technique, a 5 French sheath was placed in the right radial artery. 3 mg Verapamil was given through the sheath. 4500 units IV heparin was given. Standard diagnostic catheters were used to perform selective coronary angiography. A pigtail catheter was used to perform a left ventricular angiogram. The sheath was removed from the right radial artery and a Terumo hemostasis band was applied at the arteriotomy site on the right wrist.  There were no immediate complications. The patient was taken to the recovery area in stable condition.   Hemodynamic Findings: Central aortic pressure: 107/53 Left ventricular pressure: 107/6/9  Angiographic Findings:  Left main: Distal 20% stenosis.   Left Anterior Descending Artery: Moderate caliber vessel that does not reach the apex. The ostium has a 30% stenosis. The mid vessel has  diffuse plaque. The first diagonal branch is small to moderate in caliber (2.0 mm) and has tandem 70% stenoses which are unchanged from the last cath.   Circumflex Artery: Moderate caliber vessel with moderate caliber first obtuse marginal branch. The first OM branch arises early and has a patent stent in the proximal segment with no restenosis. The mid segment of the OM branch has a 50-60% stenosis. Distally the OM branch bifurcates and there is complex 50-60% stenosis involving both vessels beyond the branch point, non-flow limiting and unchanged from last cath. The AV groove Circumflex has 60-70% stenosis just after the takeoff of the OM branch as it is jailed by the OM stent, unchanged from last cath.    Right Coronary Artery: Large dominant vessel with severe calcification in the proximal and mid vessel. The proximal vessel has a 40% stenosis. The mid vessel has a patent stent with no restenosis. The distal vessel has a long stented segment that is patent without restenosis.  The posterolateral branch is small in caliber and has a 50% stenosis. The PDA is small to moderate in caliber (2.25 mm vessel) and in the distal segment there is a 60% stenosis followed by a 80% stenosis which is unchanged from the last cath.   Left Ventricular Angiogram: LVEF 35-40% with inferior wall hypokinesis.   Impression:  1. Triple vessel CAD 2. Patent stents in the RCA and OM 3. Moderate disease in the PDA and the OM1 4. Moderate LV systolic dysfunction  Recommendations: Will continue  medical management of CAD. No focal targets for PCI. His PDA does have moderately severe disease but intervention of this vessel would be very difficult. The RCA is heavily calcified and tortuous with multiple stents in the mid and distal vessel. Delivering a stent to the PDA would be difficult. Since this area has not changed over the last year, PCI of the PDA will not be attempted today. I will increase his Imdur to 15 mg po BID as  he reports feeling great in the am but chest pain in the afternoon. If his angina cannot be controlled with medical therapy, will have to consider PCI of the PDA and possibly the small OM branch.        Complications:  None. The patient tolerated the procedure well.

## 2014-11-28 NOTE — Interval H&P Note (Signed)
History and Physical Interval Note:  11/28/2014 10:09 AM  Gregory Bates  has presented today for cardiac cath with the diagnosis of CAD/unstable angina. The various methods of treatment have been discussed with the patient and family. After consideration of risks, benefits and other options for treatment, the patient has consented to  Procedure(s): LEFT HEART CATHETERIZATION WITH CORONARY ANGIOGRAM (N/A) as a surgical intervention .  The patient's history has been reviewed, patient examined, no change in status, stable for surgery.  I have reviewed the patient's chart and labs.  Questions were answered to the patient's satisfaction.    Cath Lab Visit (complete for each Cath Lab visit)  Clinical Evaluation Leading to the Procedure:   ACS: No.  Non-ACS:    Anginal Classification: CCS III  Anti-ischemic medical therapy: Maximal Therapy (2 or more classes of medications)  Non-Invasive Test Results: No non-invasive testing performed  Prior CABG: No previous CABG         Gregory Bates

## 2014-11-28 NOTE — Progress Notes (Signed)
Last 3cc air removed from TRB/ bleeding at site/3cc air replaced/ still bleeding/ 3cc air replaced for a total of 6cc. Site unremarkable without further bleeding. This is the second rebleed. Dr. Angelena Form notified

## 2014-11-28 NOTE — Discharge Instructions (Signed)
Hold metformin for 48 hours post cath.  Will increase Imdur to 15 mg po BID (prescription written) Resume Eliquis tomorrow if no bleeding from radial cath site Angiogram, Care After Refer to this sheet in the next few weeks. These instructions provide you with information on caring for yourself after your procedure. Your health care provider may also give you more specific instructions. Your treatment has been planned according to current medical practices, but problems sometimes occur. Call your health care provider if you have any problems or questions after your procedure.  WHAT TO EXPECT AFTER THE PROCEDURE After your procedure, it is typical to have the following sensations:  Minor discomfort or tenderness and a small bump at the catheter insertion site. The bump should usually decrease in size and tenderness within 1 to 2 weeks.  Any bruising will usually fade within 2 to 4 weeks. HOME CARE INSTRUCTIONS   You may need to keep taking blood thinners if they were prescribed for you. Take medicines only as directed by your health care provider.  Do not apply powder or lotion to the site.  Do not take baths, swim, or use a hot tub until your health care provider approves.  You may shower 24 hours after the procedure. Remove the bandage (dressing) and gently wash the site with plain soap and water. Gently pat the site dry.  Inspect the site at least twice daily.  Limit your activity for the first 48 hours. Do not bend, squat, or lift anything over 20 lb (9 kg) or as directed by your health care provider.  Plan to have someone take you home after the procedure. Follow instructions about when you can drive or return to work. SEEK MEDICAL CARE IF:  You get light-headed when standing up.  You have drainage (other than a small amount of blood on the dressing).  You have chills.  You have a fever.  You have redness, warmth, swelling, or pain at the insertion site. SEEK IMMEDIATE  MEDICAL CARE IF:   You develop chest pain or shortness of breath, feel faint, or pass out.  You have bleeding, swelling larger than a walnut, or drainage from the catheter insertion site.  You develop pain, discoloration, coldness, or severe bruising in the leg or arm that held the catheter.  You develop bleeding from any other place, such as the bowels. You may see bright red blood in your urine or stools, or your stools may appear black and tarry.  You have heavy bleeding from the site. If this happens, hold pressure on the site. MAKE SURE YOU:  Understand these instructions.  Will watch your condition.  Will get help right away if you are not doing well or get worse. Document Released: 05/07/2005 Document Revised: 03/05/2014 Document Reviewed: 03/13/2013 [facility] Patient Information 2015 [place], [place]. This information is not intended to replace advice given to you by your health care provider. Make sure you discuss any questions you have with your health care provider. Radial Site Care Refer to this sheet in the next few weeks. These instructions provide you with information on caring for yourself after your procedure. Your caregiver may also give you more specific instructions. Your treatment has been planned according to current medical practices, but problems sometimes occur. Call your caregiver if you have any problems or questions after your procedure. HOME CARE INSTRUCTIONS  You may shower the day after the procedure.Remove the bandage (dressing) and gently wash the site with plain soap and water.Gently pat the  site dry.  Do not apply powder or lotion to the site.  Do not submerge the affected site in water for 3 to 5 days.  Inspect the site at least twice daily.  Do not flex or bend the affected arm for 24 hours.  No lifting over 5 pounds (2.3 kg) for 5 days after your procedure.  Do not drive home if you are discharged the same day of the procedure. Have someone  else drive you.  You may drive 24 hours after the procedure unless otherwise instructed by your caregiver.  Do not operate machinery or power tools for 24 hours.  A responsible adult should be with you for the first 24 hours after you arrive home. What to expect:  Any bruising will usually fade within 1 to 2 weeks.  Blood that collects in the tissue (hematoma) may be painful to the touch. It should usually decrease in size and tenderness within 1 to 2 weeks. SEEK IMMEDIATE MEDICAL CARE IF:  You have unusual pain at the radial site.  You have redness, warmth, swelling, or pain at the radial site.  You have drainage (other than a small amount of blood on the dressing).  You have chills.  You have a fever or persistent symptoms for more than 72 hours.  You have a fever and your symptoms suddenly get worse.  Your arm becomes pale, cool, tingly, or numb.  You have heavy bleeding from the site. Hold pressure on the site. Document Released: 11/21/2010 Document Revised: 01/11/2012 Document Reviewed: 11/21/2010 [facility] Patient Information 2015 [place], [place]. This information is not intended to replace advice given to you by your health care provider. Make sure you discuss any questions you have with your health care provider.

## 2014-11-28 NOTE — Progress Notes (Signed)
Dr, Angelena Form called back. Orders received to wait 1 hour then reattemp air removal and restart TRB  policy

## 2014-11-29 ENCOUNTER — Other Ambulatory Visit: Payer: Self-pay | Admitting: *Deleted

## 2014-11-29 DIAGNOSIS — R079 Chest pain, unspecified: Secondary | ICD-10-CM

## 2014-11-29 MED ORDER — ISOSORBIDE MONONITRATE ER 30 MG PO TB24: 30.0000 mg | ORAL_TABLET | Freq: Every day | ORAL | Status: DC

## 2014-11-29 MED ORDER — VERAPAMIL HCL ER 180 MG PO TBCR: 180.0000 mg | EXTENDED_RELEASE_TABLET | Freq: Every day | ORAL | Status: DC

## 2014-12-03 ENCOUNTER — Emergency Department (HOSPITAL_COMMUNITY)
Admission: EM | Admit: 2014-12-03 | Discharge: 2014-12-03 | Disposition: A | Discharge status: Home / Self Care | Payer: BLUE CROSS/BLUE SHIELD | Attending: Emergency Medicine | Admitting: Emergency Medicine

## 2014-12-03 ENCOUNTER — Emergency Department (HOSPITAL_COMMUNITY): Payer: BLUE CROSS/BLUE SHIELD

## 2014-12-03 ENCOUNTER — Encounter (HOSPITAL_COMMUNITY): Payer: Self-pay | Admitting: Neurology

## 2014-12-03 DIAGNOSIS — I252 Old myocardial infarction: Secondary | ICD-10-CM | POA: Diagnosis not present

## 2014-12-03 DIAGNOSIS — Z8719 Personal history of other diseases of the digestive system: Secondary | ICD-10-CM | POA: Insufficient documentation

## 2014-12-03 DIAGNOSIS — Z7902 Long term (current) use of antithrombotics/antiplatelets: Secondary | ICD-10-CM | POA: Diagnosis not present

## 2014-12-03 DIAGNOSIS — Z87448 Personal history of other diseases of urinary system: Secondary | ICD-10-CM | POA: Diagnosis not present

## 2014-12-03 DIAGNOSIS — I2510 Atherosclerotic heart disease of native coronary artery without angina pectoris: Secondary | ICD-10-CM | POA: Diagnosis not present

## 2014-12-03 DIAGNOSIS — E119 Type 2 diabetes mellitus without complications: Secondary | ICD-10-CM | POA: Diagnosis not present

## 2014-12-03 DIAGNOSIS — I4892 Unspecified atrial flutter: Secondary | ICD-10-CM

## 2014-12-03 DIAGNOSIS — Z862 Personal history of diseases of the blood and blood-forming organs and certain disorders involving the immune mechanism: Secondary | ICD-10-CM | POA: Diagnosis not present

## 2014-12-03 DIAGNOSIS — E785 Hyperlipidemia, unspecified: Secondary | ICD-10-CM | POA: Diagnosis not present

## 2014-12-03 DIAGNOSIS — Z9889 Other specified postprocedural states: Secondary | ICD-10-CM | POA: Diagnosis not present

## 2014-12-03 DIAGNOSIS — Z87438 Personal history of other diseases of male genital organs: Secondary | ICD-10-CM | POA: Diagnosis not present

## 2014-12-03 DIAGNOSIS — Z79899 Other long term (current) drug therapy: Secondary | ICD-10-CM | POA: Diagnosis not present

## 2014-12-03 DIAGNOSIS — I484 Atypical atrial flutter: Secondary | ICD-10-CM

## 2014-12-03 DIAGNOSIS — Z87891 Personal history of nicotine dependence: Secondary | ICD-10-CM | POA: Diagnosis not present

## 2014-12-03 DIAGNOSIS — R079 Chest pain, unspecified: Secondary | ICD-10-CM | POA: Diagnosis present

## 2014-12-03 DIAGNOSIS — I10 Essential (primary) hypertension: Secondary | ICD-10-CM | POA: Insufficient documentation

## 2014-12-03 LAB — BASIC METABOLIC PANEL
ANION GAP: 8 (ref 5–15)
BUN: 19 mg/dL (ref 6–23)
CALCIUM: 10 mg/dL (ref 8.4–10.5)
CO2: 28 mmol/L (ref 19–32)
Chloride: 103 mmol/L (ref 96–112)
Creatinine, Ser: 1.49 mg/dL — ABNORMAL HIGH (ref 0.50–1.35)
GFR calc Af Amer: 52 mL/min — ABNORMAL LOW (ref >90)
GFR, EST NON AFRICAN AMERICAN: 44 mL/min — AB (ref >90)
GLUCOSE: 228 mg/dL — AB (ref 70–99)
Potassium: 4.4 mmol/L (ref 3.5–5.1)
SODIUM: 139 mmol/L (ref 135–145)

## 2014-12-03 LAB — CBC
HEMATOCRIT: 40.4 % (ref 39.0–52.0)
HEMOGLOBIN: 13.6 g/dL (ref 13.0–17.0)
MCH: 30.4 pg (ref 26.0–34.0)
MCHC: 33.7 g/dL (ref 30.0–36.0)
MCV: 90.2 fL (ref 78.0–100.0)
PLATELETS: 220 10*3/uL (ref 150–400)
RBC: 4.48 MIL/uL (ref 4.22–5.81)
RDW: 12.8 % (ref 11.5–15.5)
WBC: 8.4 10*3/uL (ref 4.0–10.5)

## 2014-12-03 LAB — I-STAT TROPONIN, ED: Troponin i, poc: 0 ng/mL (ref 0.00–0.08)

## 2014-12-03 MED ORDER — DILTIAZEM LOAD VIA INFUSION
15.0000 mg | Freq: Once | INTRAVENOUS | Status: DC
  Filled 2014-12-03 (×2): qty 15

## 2014-12-03 MED ORDER — METOPROLOL TARTRATE 1 MG/ML IV SOLN
5.0000 mg | Freq: Once | INTRAVENOUS | Status: AC
  Administered 2014-12-03: 5 mg via INTRAVENOUS
  Filled 2014-12-03: qty 5

## 2014-12-03 MED ORDER — DILTIAZEM HCL 25 MG/5ML IV SOLN
15.0000 mg | Freq: Once | INTRAVENOUS | Status: DC
  Filled 2014-12-03: qty 5

## 2014-12-03 MED ORDER — METOPROLOL TARTRATE 25 MG PO TABS: 50.0000 mg | ORAL_TABLET | Freq: Two times a day (BID) | ORAL | Status: DC

## 2014-12-03 MED ORDER — DILTIAZEM HCL 100 MG IV SOLR: 5.0000 mg/h | INTRAVENOUS | Status: DC

## 2014-12-03 NOTE — ED Notes (Signed)
Pt reports left sided cp with radiation to left arm and left jaw since 12 pm today. States sob, denies n/v. Has cardiac hx.

## 2014-12-03 NOTE — Discharge Instructions (Signed)
Atrial Flutter °Atrial flutter is a heart rhythm that can cause the heart to beat very fast (tachycardia). It originates in the upper chambers of the heart (atria). In atrial flutter, the top chambers of the heart (atria) often beat much faster than the bottom chambers of the heart (ventricles). Atrial flutter has a regular "saw toothed" appearance in an EKG readout. An EKG is a test that records the electrical activity of the heart. Atrial flutter can cause the heart to beat up to 150 beats per minute (BPM). Atrial flutter can either be short lived (paroxysmal) or permanent.  °CAUSES  °Causes of atrial flutter can be many. Some of these include: °· Heart related issues: °¨ Heart attack (myocardial infarction). °¨ Heart failure. °¨ Heart valve problems. °¨ Poorly controlled high blood pressure (hypertension). °¨ After open heart surgery. °· Lung related issues: °¨ A blood clot in the lungs (pulmonary embolism). °¨ Chronic obstructive pulmonary disease (COPD). Medications used to treat COPD can attribute to atrial flutter. °· Other related causes: °¨ Hyperthyroidism. °¨ Caffeine. °¨ Some decongestant cold medications. °¨ Low electrolyte levels such as potassium or magnesium. °¨ Cocaine. °SYMPTOMS °· An awareness of your heart beating rapidly (palpitations). °· Shortness of breath. °· Chest pain. °· Low blood pressure (hypotension). °· Dizziness or fainting. °DIAGNOSIS  °Different tests can be performed to diagnose atrial flutter.  °· An EKG. °· Holter monitor. This is a 24-hour recording of your heart rhythm. You will also be given a diary. Write down all symptoms that you have and what you were doing at the time you experienced symptoms. °· Cardiac event monitor. This small device can be worn for up to 30 days. When you have heart symptoms, you will push a button on the device. This will then record your heart rhythm. °· Echocardiogram. This is an imaging test to look at your heart. Your caregiver will look at your  heart valves and the ventricles. °· Stress test. This test can help determine if the atrial flutter is related to exercise or if coronary artery disease is present. °· Laboratory studies will look at certain blood levels like: °¨ Complete blood count (CBC). °¨ Potassium. °¨ Magnesium. °¨ Thyroid function. °TREATMENT  °Treatment of atrial flutter varies. A combination of therapies may be used or sometimes atrial flutter may need only 1 type of treatment.  °Lab work: °If your blood work, such as your electrolytes (potassium, magnesium) or your thyroid function tests, are abnormal, your caregiver will treat them accordingly.  °Medication:  °There are several different types of medications that can convert your heart to a normal rhythm and prevent atrial flutter from reoccurring.  °Nonsurgical procedures: °Nonsurgical techniques may be used to control atrial flutter. Some examples include: °· Cardioversion. This technique uses either drugs or an electrical shock to restore a normal heart rhythm: °¨ Cardioversion drugs may be given through an intravenous (IV) line to help "reset" the heart rhythm. °¨ In electrical cardioversion, your caregiver shocks your heart with electrical energy. This helps to reset the heartbeat to a normal rhythm. °· Ablation. If atrial flutter is a persistent problem, an ablation may be needed. This procedure is done under mild sedation. High frequency radio-wave energy is used to destroy the area of heart tissue responsible for atrial flutter. °SEEK IMMEDIATE MEDICAL CARE IF:  °You have: °· Dizziness. °· Near fainting or fainting. °· Shortness of breath. °· Chest pain or pressure. °· Sudden nausea or vomiting. °· Profuse sweating. °If you have the above symptoms,   call your local emergency service immediately! Do not drive yourself to the hospital. °MAKE SURE YOU:  °· Understand these instructions. °· Will watch your condition. °· Will get help right away if you are not doing well or get  worse. °Document Released: 03/07/2009 Document Revised: 03/05/2014 Document Reviewed: 03/07/2009 °[employer]® Patient Information ©2015 [employer]. This information is not intended to replace advice given to you by your health care provider. Make sure you discuss any questions you have with your health care provider. ° °

## 2014-12-03 NOTE — ED Notes (Signed)
Patient returned from [place]. Patient reports, "I do not think my heart is racing anymore." Patient placed on the monitor. HR 100. MD notified before proceeding with medication.

## 2014-12-03 NOTE — ED Notes (Signed)
MD stated to hold the Cardizem.

## 2014-12-03 NOTE — ED Provider Notes (Addendum)
CSN: [id number]     Arrival date & time 12/03/14  1408 History   First MD Initiated Contact with Patient 12/03/14 1426     Chief Complaint  Patient presents with  . Chest Pain     HPI Patient with a known history of coronary artery disease who underwent heart catheterization 4 days ago which demonstrated ongoing coronary artery disease without reasonable PCI targets.  Medical management was elected at that time.  He has a history of paroxysmal atrial fibrillation/atrial flutter.  He presents today with chest discomfort and pressure with associated palpitations that began around noon today.  He reports some mild shortness of breath associated with his palpitations.  He denies excessive use of caffeine.  He denies stimulant use.  He denies dietary indiscretion.  He reports compliance with his medication and knows all of his medications and doses.  He denies productive cough.  No orthopnea.  No unilateral leg swelling.   Past Medical History  Diagnosis Date  . CAD (coronary artery disease), native coronary artery     a. s/p cath May 2011 >> DES distal RCA;  b. 01/2013 NSTEMI >> OM1 99p (2.75x12 Promus Premier DES);  c. LHC (2/15): dLM 20, oLAD 30, D1 with tandem 70, pOM1 stent ok w/ mid 50, both vessels dOM bifurc 50, AVCFX 60-70, pRCA 40, mRCA stent ok, dRCA 99 on prox edge of old stent, PL Br 50, PDA Br 60 followed by 80, EF 40% with inf HK >> PCI: Promus DES to dist RCA   . Hypertension   . Hyperlipidemia   . Diabetes mellitus     AODM  . Anemia, unspecified   . Colorectal polyps 2002  . Erectile dysfunction   . Diastolic dysfunction     a. 01/2013 Echo: EF 55%, mid-dist inflat HK, Gr1 DD, Triv AI/TR, Mild MR.;  b.  Echo (1/15): Mild LVH, EF 55%, inferolateral hypokinesis, grade 1 diastolic dysfunction, mild AI, trivial MR, moderate LAE, PASP 36 mmHg   . Atrial flutter     a. Dx 11/2013. Spont conv. Placed on eliquis.  . Acute renal insufficiency     a. During 11/2013 adm with atrial flutter.   . Myocardial infarction   . Hx of cardiovascular stress test     Stress myoview 08/07/13 with LVEF 44%, inferior scar, no ischemia   Past Surgical History  Procedure Laterality Date  . Wisdom tooth extraction    . Colonoscopy w/ polypectomy  2002    [facility]  . Infantile paralysis facial asymmetry    . Coronary stent placement  2007, 2011  . Colonoscopy w/ polypectomy  2004; 2007 negative  . Coronary angioplasty  12/26/2013  . Left heart catheterization with coronary angiogram N/A 10/02/2012    Procedure: LEFT HEART CATHETERIZATION WITH CORONARY ANGIOGRAM;  Surgeon: Peter M Martinique, MD;  Location: [facility] CATH LAB;  Service: Cardiovascular;  Laterality: N/A;  . Percutaneous coronary stent intervention (pci-s)  10/02/2012    Procedure: PERCUTANEOUS CORONARY STENT INTERVENTION (PCI-S);  Surgeon: Peter M Martinique, MD;  Location: [facility] CATH LAB;  Service: Cardiovascular;;  . Left heart catheterization with coronary angiogram N/A 02/20/2013    Procedure: LEFT HEART CATHETERIZATION WITH CORONARY ANGIOGRAM;  Surgeon: Burnell Blanks, MD;  Location: [facility] CATH LAB;  Service: Cardiovascular;  Laterality: N/A;  . Percutaneous coronary stent intervention (pci-s)  02/20/2013    Procedure: PERCUTANEOUS CORONARY STENT INTERVENTION (PCI-S);  Surgeon: Burnell Blanks, MD;  Location: [facility] CATH LAB;  Service: Cardiovascular;;  . Left  heart catheterization with coronary angiogram N/A 12/26/2013    Procedure: LEFT HEART CATHETERIZATION WITH CORONARY ANGIOGRAM;  Surgeon: Burnell Blanks, MD;  Location: [facility] CATH LAB;  Service: Cardiovascular;  Laterality: N/A;  . Percutaneous coronary stent intervention (pci-s)  12/26/2013    Procedure: PERCUTANEOUS CORONARY STENT INTERVENTION (PCI-S);  Surgeon: Burnell Blanks, MD;  Location: [facility] CATH LAB;  Service: Cardiovascular;;  . Left heart catheterization with coronary angiogram N/A 11/28/2014    Procedure: LEFT HEART CATHETERIZATION WITH CORONARY ANGIOGRAM;   Surgeon: Burnell Blanks, MD;  Location: [facility] CATH LAB;  Service: Cardiovascular;  Laterality: N/A;   Family History  Problem Relation Age of Onset  . Heart attack Brother 39  . Colon cancer Brother   . Diabetes Neg Hx   . Stroke Neg Hx   . Hypertension Neg Hx    History  Substance Use Topics  . Smoking status: Former Smoker    Quit date: 11/02/1984  . Smokeless tobacco: Never Used     Comment: smoked [place], up to 3 ppd (!)  . Alcohol Use: No    Review of Systems  All other systems reviewed and are negative.     Allergies  Brilinta; Crestor; and Lipitor  Home Medications   Prior to Admission medications   Medication Sig Start Date End Date Taking? Authorizing Provider  apixaban (ELIQUIS) 5 MG TABS tablet Take 1 tablet (5 mg total) by mouth 2 (two) times daily. 09/20/14   Burnell Blanks, MD  calcium carbonate (OS-CAL) 600 MG TABS tablet Take 600 mg by mouth daily with breakfast.     Historical Provider, MD  cholecalciferol (VITAMIN D) 1000 UNITS tablet Take 1,000 Units by mouth daily.    Historical Provider, MD  clopidogrel (PLAVIX) 75 MG tablet Take 1 tablet (75 mg total) by mouth daily with breakfast. 03/07/14   Burnell Blanks, MD  fexofenadine (ALLEGRA) 180 MG tablet Take 180 mg by mouth daily.      Historical Provider, MD  isosorbide mononitrate (IMDUR) 30 MG 24 hr tablet Take 1 tablet (30 mg total) by mouth daily. 11/29/14   Burnell Blanks, MD  lisinopril (PRINIVIL,ZESTRIL) 20 MG tablet Take 1 tablet (20 mg total) by mouth daily. 03/07/14   Burnell Blanks, MD  metoprolol (LOPRESSOR) 25 MG tablet Take 1 tablet (25 mg total) by mouth 2 (two) times daily. 11/07/14   Liliane Shi, PA-C  Multiple Vitamin (MULTIVITAMIN) tablet Take 1 tablet by mouth daily.      Historical Provider, MD  nitroGLYCERIN (NITROSTAT) 0.4 MG SL tablet Place 0.4 mg under the tongue every 5 (five) minutes as needed for chest pain.    Historical Provider, MD   potassium chloride SA (K-DUR,KLOR-CON) 20 MEQ tablet Take 20 mEq by mouth daily as needed (FOR LEG CRAMPS).    Historical Provider, MD  simvastatin (ZOCOR) 40 MG tablet Take 1 tablet (40 mg total) by mouth every evening. 03/07/14   Burnell Blanks, MD  verapamil (CALAN-SR) 180 MG CR tablet Take 1 tablet (180 mg total) by mouth at bedtime. 11/29/14   Burnell Blanks, MD  vitamin E 100 UNIT capsule Take 100 Units by mouth daily.    Historical Provider, MD   BP 123/60 mmHg  Pulse 95  Temp(Src) 98.1 F (36.7 C)  Resp 15  SpO2 96% Physical Exam  Constitutional: He is oriented to person, place, and time. He appears well-developed and well-nourished.  HENT:  Head: Normocephalic and atraumatic.  Eyes: EOM  are normal.  Neck: Normal range of motion.  Cardiovascular: Normal rate, regular rhythm, normal heart sounds and intact distal pulses.   Pulmonary/Chest: Effort normal and breath sounds normal. No respiratory distress.  Abdominal: Soft. He exhibits no distension. There is no tenderness.  Musculoskeletal: Normal range of motion.  Neurological: He is alert and oriented to person, place, and time.  Skin: Skin is warm and dry.  Psychiatric: He has a normal mood and affect. Judgment normal.  Nursing note and vitals reviewed.   ED Course  Procedures (including critical care time) Labs Review Labs Reviewed  BASIC METABOLIC PANEL - Abnormal; Notable for the following:    Glucose, Bld 228 (*)    Creatinine, Ser 1.49 (*)    GFR calc non Af Amer 44 (*)    GFR calc Af Amer 52 (*)    All other components within normal limits  CBC  I-STAT TROPOININ, ED    Imaging Review Dg Chest 2 View  12/03/2014   CLINICAL DATA:  Left-sided chest pain radiating into left arm left stents 12 p.m. today  EXAM: CHEST  2 VIEW  COMPARISON:  Radiograph 11/12/2014 11/03/2013, 02/17/2013  FINDINGS: Normal mediastinum and cardiac silhouette. Chronic central bronchitic markings. Normal pulmonary vasculature.  No effusion, infiltrate, or pneumothorax. Degenerative osteophytosis of the thoracic spine.  5 mm left upper lobe pulmonary nodule.  IMPRESSION: Chronic bronchitic markings.  No acute findings.  5 mm left upper lobe pulmonary nodule is not seen on exam from 2014 or 15. If patient has smoking history, recommend noncontrast CT thorax.   Electronically Signed   By: Suzy Bouchard M.D.   On: 12/03/2014 16:04    EKG Interpretation #1  Date/Time:  Monday December 03 2014 14:15:30 EST Ventricular Rate:  147 PR Interval:    QRS Duration: 88 QT Interval:  356 QTC Calculation: 557 R Axis:   50 Text Interpretation:  Atrial flutter with 2:1 A-V conduction Inferior  infarct , age undetermined Marked ST abnormality, possible lateral  subendocardial injury Abnormal ECG changed from prior ecg. atrial flutter  is new Confirmed by Olivia Pavelko  MD, Lennette Bihari ([id number]) on 12/03/2014 2:27:29 PM     ECG interpretation #2  Date: 12/03/2014  Rate: 99  Rhythm: normal sinus rhythm  QRS Axis: normal  Intervals: normal  ST/T Wave abnormalities: normal  Conduction Disutrbances: none  Narrative Interpretation:   Old EKG Reviewed: no longer in atrial flutter.      MDM   Final diagnoses:  None    Patient converted to normal sinus rhythm while in the emergency department.  He is on anticoagulation already.  I spoke with his cardiologist Dr. Angelena Form who recommends that we increase his metoprolol to 50 mg by mouth twice a day.  He will schedule the patient to be seen in the office and will likely initiate rhythm control.  Patient is asymptomatic at this time.  We'll be discharged home after single dose of IV metoprolol.  He will begin his 50 mg by mouth twice a day metoprolol this evening.  Patient understands there will be no other changes in his other medications.    Hoy Morn, MD 12/03/14 [place], MD 12/03/14 [phone]

## 2014-12-03 NOTE — ED Notes (Signed)
Patient sent to Chest Xray by Ebony Hail, RN.

## 2014-12-05 ENCOUNTER — Ambulatory Visit (INDEPENDENT_AMBULATORY_CARE_PROVIDER_SITE_OTHER): Payer: BLUE CROSS/BLUE SHIELD | Admitting: Cardiovascular Disease

## 2014-12-05 ENCOUNTER — Encounter: Payer: Self-pay | Admitting: Cardiovascular Disease

## 2014-12-05 ENCOUNTER — Other Ambulatory Visit: Payer: Self-pay | Admitting: *Deleted

## 2014-12-05 VITALS — BP 98/60 | HR 55 | Ht 71.0 in | Wt 194.0 lb

## 2014-12-05 DIAGNOSIS — E785 Hyperlipidemia, unspecified: Secondary | ICD-10-CM

## 2014-12-05 DIAGNOSIS — I480 Paroxysmal atrial fibrillation: Secondary | ICD-10-CM

## 2014-12-05 DIAGNOSIS — I484 Atypical atrial flutter: Secondary | ICD-10-CM

## 2014-12-05 DIAGNOSIS — I10 Essential (primary) hypertension: Secondary | ICD-10-CM

## 2014-12-05 DIAGNOSIS — I2510 Atherosclerotic heart disease of native coronary artery without angina pectoris: Secondary | ICD-10-CM

## 2014-12-05 MED ORDER — AMIODARONE HCL 200 MG PO TABS: ORAL_TABLET | ORAL | Status: DC

## 2014-12-05 MED ORDER — ATORVASTATIN CALCIUM 20 MG PO TABS: 20.0000 mg | ORAL_TABLET | Freq: Every day | ORAL | Status: DC

## 2014-12-05 MED ORDER — METOPROLOL TARTRATE 50 MG PO TABS: 50.0000 mg | ORAL_TABLET | Freq: Two times a day (BID) | ORAL | Status: DC

## 2014-12-05 NOTE — Addendum Note (Signed)
Addended by: Thompson Grayer on: 12/05/2014 10:51 AM   Modules accepted: Orders

## 2014-12-05 NOTE — Progress Notes (Signed)
History of Present Illness: 74 yo WM with history of DM, HTN, hyperlipidemia, atrial flutter and CAD here today for cardiac follow up. He has multiple admissions for CAD and atrial flutter. Admitted to [facility] 10/02/12 with inferior STEMI and found to have occluded RCA treated with DES. Readmitted 02/17/13 with NSTEMI. DES placed in severe first OM stenosis. Noted to have diffuse moderate disease in all vessels. Stress myoview 08/07/13 with LVEF-=44%, inferior scar, no ischemia. Admitted to [facility] 11/03/12 with SOB and chest pain. He was found to be in atrial flutter with RVR. He converted to sinus on diltiazem drip. Echo 11/05/12 with normal LV size and function. He was started on Eliquis and discharged on 11/05/12. His metoprolol was increased to 50 mg po BID. I saw him February 2015 and he had c/o CP and fatigue, dyspnea. I arranged a cardiac cath on 12/26/13 and he was found to have high grade stenosis in the distal RCA. A 2.75 x 16 mm Promus Premier DES was placed in the distal RCA. He was seen in the ED January 4th and January 11th with atrial fibrillation and has had office follow up with Richardson Dopp, PA-C. His diltiazem was titrated but he developed a rash and he was changed to verapamil. He has since been managed with Lopressor and verapamil. Due to recurrent chest pain, cardiac cath was repeated on 11/28/14 and showed stable CAD. He presented back to the ED 12/03/14 with palpitations and was found to be in atrial flutter with RVR. Metoprolol was increased to 50 mg po BID.   He is here today for follow up.  Feeling well today. He only feels poorly when he is in atrial fib/flutter. No chest pain or SOB. Frustrated with the atrial arhythmias.  Primary Care Physician: [facility], NP) Urology: Joie Bimler, MD  Last Lipid Profile:Lipid Panel     Component Value Date/Time   CHOL 170 11/04/2013 0617   TRIG 166* 11/04/2013 0617   HDL 34* 11/04/2013 0617   CHOLHDL 5.0 11/04/2013  0617   VLDL 33 11/04/2013 0617   LDLCALC 103* 11/04/2013 0617    Past Medical History  Diagnosis Date  . CAD (coronary artery disease), native coronary artery     a. s/p cath May 2011 >> DES distal RCA;  b. 01/2013 NSTEMI >> OM1 99p (2.75x12 Promus Premier DES);  c. LHC (2/15): dLM 20, oLAD 30, D1 with tandem 70, pOM1 stent ok w/ mid 50, both vessels dOM bifurc 50, AVCFX 60-70, pRCA 40, mRCA stent ok, dRCA 99 on prox edge of old stent, PL Br 50, PDA Br 60 followed by 80, EF 40% with inf HK >> PCI: Promus DES to dist RCA   . Hypertension   . Hyperlipidemia   . Diabetes mellitus     AODM  . Anemia, unspecified   . Colorectal polyps 2002  . Erectile dysfunction   . Diastolic dysfunction     a. 01/2013 Echo: EF 55%, mid-dist inflat HK, Gr1 DD, Triv AI/TR, Mild MR.;  b.  Echo (1/15): Mild LVH, EF 55%, inferolateral hypokinesis, grade 1 diastolic dysfunction, mild AI, trivial MR, moderate LAE, PASP 36 mmHg   . Atrial flutter     a. Dx 11/2013. Spont conv. Placed on eliquis.  . Acute renal insufficiency     a. During 11/2013 adm with atrial flutter.  . Myocardial infarction   . Hx of cardiovascular stress test     Stress myoview 08/07/13 with LVEF 44%,  inferior scar, no ischemia    Past Surgical History  Procedure Laterality Date  . Wisdom tooth extraction    . Colonoscopy w/ polypectomy  2002    [facility]  . Infantile paralysis facial asymmetry    . Coronary stent placement  2007, 2011  . Colonoscopy w/ polypectomy  2004; 2007 negative  . Coronary angioplasty  12/26/2013  . Left heart catheterization with coronary angiogram N/A 10/02/2012    Procedure: LEFT HEART CATHETERIZATION WITH CORONARY ANGIOGRAM;  Surgeon: Peter M Martinique, MD;  Location: [facility] CATH LAB;  Service: Cardiovascular;  Laterality: N/A;  . Percutaneous coronary stent intervention (pci-s)  10/02/2012    Procedure: PERCUTANEOUS CORONARY STENT INTERVENTION (PCI-S);  Surgeon: Peter M Martinique, MD;  Location: [facility] CATH LAB;  Service:  Cardiovascular;;  . Left heart catheterization with coronary angiogram N/A 02/20/2013    Procedure: LEFT HEART CATHETERIZATION WITH CORONARY ANGIOGRAM;  Surgeon: Burnell Blanks, MD;  Location: [facility] CATH LAB;  Service: Cardiovascular;  Laterality: N/A;  . Percutaneous coronary stent intervention (pci-s)  02/20/2013    Procedure: PERCUTANEOUS CORONARY STENT INTERVENTION (PCI-S);  Surgeon: Burnell Blanks, MD;  Location: [facility] CATH LAB;  Service: Cardiovascular;;  . Left heart catheterization with coronary angiogram N/A 12/26/2013    Procedure: LEFT HEART CATHETERIZATION WITH CORONARY ANGIOGRAM;  Surgeon: Burnell Blanks, MD;  Location: [facility] CATH LAB;  Service: Cardiovascular;  Laterality: N/A;  . Percutaneous coronary stent intervention (pci-s)  12/26/2013    Procedure: PERCUTANEOUS CORONARY STENT INTERVENTION (PCI-S);  Surgeon: Burnell Blanks, MD;  Location: [facility] CATH LAB;  Service: Cardiovascular;;  . Left heart catheterization with coronary angiogram N/A 11/28/2014    Procedure: LEFT HEART CATHETERIZATION WITH CORONARY ANGIOGRAM;  Surgeon: Burnell Blanks, MD;  Location: [facility] CATH LAB;  Service: Cardiovascular;  Laterality: N/A;    Current Outpatient Prescriptions  Medication Sig Dispense Refill  . apixaban (ELIQUIS) 5 MG TABS tablet Take 1 tablet (5 mg total) by mouth 2 (two) times daily. 60 tablet 11  . calcium carbonate (OS-CAL) 600 MG TABS tablet Take 600 mg by mouth daily with breakfast.     . cholecalciferol (VITAMIN D) 1000 UNITS tablet Take 1,000 Units by mouth daily.    . clopidogrel (PLAVIX) 75 MG tablet Take 1 tablet (75 mg total) by mouth daily with breakfast. 90 tablet 3  . fexofenadine (ALLEGRA) 180 MG tablet Take 180 mg by mouth daily.      . isosorbide mononitrate (IMDUR) 30 MG 24 hr tablet Take 1 tablet (30 mg total) by mouth daily. 90 tablet 3  . lisinopril (PRINIVIL,ZESTRIL) 20 MG tablet Take 1 tablet (20 mg total) by mouth daily. 90 tablet 3  .  metoprolol tartrate (LOPRESSOR) 25 MG tablet Take 2 tablets (50 mg total) by mouth 2 (two) times daily. 60 tablet 0  . Multiple Vitamin (MULTIVITAMIN) tablet Take 1 tablet by mouth daily.      . nitroGLYCERIN (NITROSTAT) 0.4 MG SL tablet Place 0.4 mg under the tongue every 5 (five) minutes as needed for chest pain.    . potassium chloride SA (K-DUR,KLOR-CON) 20 MEQ tablet Take 20 mEq by mouth daily as needed (FOR LEG CRAMPS).    . simvastatin (ZOCOR) 40 MG tablet Take 1 tablet (40 mg total) by mouth every evening. 90 tablet 3  . verapamil (CALAN-SR) 180 MG CR tablet Take 1 tablet (180 mg total) by mouth at bedtime. 90 tablet 3  . vitamin E 100 UNIT capsule Take 100 Units by mouth daily.  No current facility-administered medications for this visit.    Allergies  Allergen Reactions  . Brilinta [Ticagrelor]     Arthralgias & myalgias  . Crestor [Rosuvastatin]     Myalgias (takes Zocor)  . Lipitor [Atorvastatin]     Myalgias (takes Zocor)    History   Social History  . Marital Status: Married    Spouse Name: N/A    Number of Children: 1  . Years of Education: N/A   Occupational History  . maintenance     full time   Social History Main Topics  . Smoking status: Former Smoker    Quit date: 11/02/1984  . Smokeless tobacco: Never Used     Comment: smoked [place], up to 3 ppd (!)  . Alcohol Use: No  . Drug Use: No  . Sexual Activity: Not on file   Other Topics Concern  . Not on file   Social History Narrative    Family History  Problem Relation Age of Onset  . Heart attack Brother 78  . Colon cancer Brother   . Diabetes Neg Hx   . Stroke Neg Hx   . Hypertension Neg Hx     Review of Systems:  As stated in the HPI and otherwise negative.   BP 98/60 mmHg  Pulse 55  Ht 5\' 11"  (1.803 m)  Wt 194 lb (87.998 kg)  BMI 27.07 kg/m2  SpO2 98%  Physical Examination: General: Well developed, well nourished, NAD HEENT: OP clear, mucus membranes moist SKIN: warm,  dry. No rashes. Neuro: No focal deficits Musculoskeletal: Muscle strength 5/5 all ext Psychiatric: Mood and affect normal Neck: No JVD, no carotid bruits, no thyromegaly, no lymphadenopathy. Lungs:Clear bilaterally, no wheezes, rhonci, crackles Cardiovascular: Regular rate and rhythm. No murmurs, gallops or rubs. Abdomen:Soft. Bowel sounds present. Non-tender.  Extremities: No lower extremity edema. Pulses are 2 + in the bilateral DP/PT.  Cardiac cath 11/28/14: Left main: Distal 20% stenosis.  Left Anterior Descending Artery: Moderate caliber vessel that does not reach the apex. The ostium has a 30% stenosis. The mid vessel has diffuse plaque. The first diagonal branch is small to moderate in caliber (2.0 mm) and has tandem 70% stenoses which are unchanged from the last cath.  Circumflex Artery: Moderate caliber vessel with moderate caliber first obtuse marginal branch. The first OM branch arises early and has a patent stent in the proximal segment with no restenosis. The mid segment of the OM branch has a 50-60% stenosis. Distally the OM branch bifurcates and there is complex 50-60% stenosis involving both vessels beyond the branch point, non-flow limiting and unchanged from last cath. The AV groove Circumflex has 60-70% stenosis just after the takeoff of the OM branch as it is jailed by the OM stent, unchanged from last cath.  Right Coronary Artery: Large dominant vessel with severe calcification in the proximal and mid vessel. The proximal vessel has a 40% stenosis. The mid vessel has a patent stent with no restenosis. The distal vessel has a long stented segment that is patent without restenosis. The posterolateral branch is small in caliber and has a 50% stenosis. The PDA is small to moderate in caliber (2.25 mm vessel) and in the distal segment there is a 60% stenosis followed by a 80% stenosis which is unchanged from the last cath.  Left Ventricular Angiogram: LVEF 35-40% with inferior wall  hypokinesis.    Echo 11/05/13: Left ventricle: The cavity size was at the upper limits of normal. Wall thickness was  increased in a pattern of mild LVH. The estimated ejection fraction was 55%. There is hypokinesis of the basalinferolateral myocardium. Doppler parameters are consistent with abnormal left ventricular relaxation (grade 1 diastolic dysfunction). - Aortic valve: Mild regurgitation. - Mitral valve: Trivial regurgitation. - Left atrium: The atrium was moderately dilated. - Right atrium: Central venous pressure: 58mm Hg (est). - Atrial septum: No defect or patent foramen ovale was identified. - Tricuspid valve: Trivial regurgitation. - Pulmonary arteries: PA peak pressure: 61mm Hg (S). - Pericardium, extracardiac: There was no pericardial effusion  EKG: Sinus brady, rate 55 bpm. Old inferior Q waves.   Assessment and Plan:   1. CAD: Stable after recent cath January 2016. No chest pain. Continue Plavix, statin, beta blocker. No ASA with use of Eliquis.   2. HTN: BP is controlled. No changes.    3. Hyperlipidemia: He is on a statin. Lipids have been most recently checked in primary care office. Will continue Zocor 20 mg po QHS.   4. Atrial flutter: Sinus brady this am. He is having more frequent paroxysms of atrial fib and flutter with several trips to the ED over the last few months. Will stop verapamil. Will continue metoprolol and will add amiodarone. Will load over one week with 200 mg po BID and then continue 200 mg per day. Willl continue Eliquis for anti-coagulation. Recent normal TSH, eye exam and LFTs. If we cannot control his atrial arytthmias with amiodarone, will refer to EP.

## 2014-12-05 NOTE — Patient Instructions (Signed)
Your physician recommends that you schedule a follow-up appointment in: about 8 weeks--Scheduled for February 12, 2015 at 3:30   Your physician has recommended you make the following change in your medication:   Stop Verapamil. Stop Simvastatin. Start amiodarone 200 mg by mouth twice daily for 7 days and then 200 mg by mouth daily. Start Atorvastatin 20 mg by mouth daily at bedtime.

## 2014-12-20 ENCOUNTER — Ambulatory Visit: Payer: Commercial Managed Care - HMO | Admitting: Physician Assistant

## 2015-01-03 ENCOUNTER — Ambulatory Visit: Payer: Commercial Managed Care - HMO | Admitting: Cardiovascular Disease

## 2015-02-12 ENCOUNTER — Encounter: Payer: Self-pay | Admitting: Cardiovascular Disease

## 2015-02-12 ENCOUNTER — Ambulatory Visit (INDEPENDENT_AMBULATORY_CARE_PROVIDER_SITE_OTHER): Payer: BLUE CROSS/BLUE SHIELD | Admitting: Cardiovascular Disease

## 2015-02-12 VITALS — BP 110/80 | HR 60 | Ht 71.0 in | Wt 196.0 lb

## 2015-02-12 DIAGNOSIS — I480 Paroxysmal atrial fibrillation: Secondary | ICD-10-CM

## 2015-02-12 DIAGNOSIS — I2510 Atherosclerotic heart disease of native coronary artery without angina pectoris: Secondary | ICD-10-CM

## 2015-02-12 DIAGNOSIS — I10 Essential (primary) hypertension: Secondary | ICD-10-CM | POA: Diagnosis not present

## 2015-02-12 DIAGNOSIS — E785 Hyperlipidemia, unspecified: Secondary | ICD-10-CM | POA: Diagnosis not present

## 2015-02-12 NOTE — Patient Instructions (Signed)
Medication Instructions:  Your physician recommends that you continue on your current medications as directed. Please refer to the Current Medication list given to you today.   Labwork: Have lab work done at primary care  Testing/Procedures: none  Follow-Up:  Your physician wants you to follow-up in:  6 months. You will receive a reminder letter in the mail two months in advance. If you don't receive a letter, please call our office to schedule the follow-up appointment.

## 2015-02-12 NOTE — Progress Notes (Signed)
Chief Complaint  Patient presents with  . Follow-up  CAD   History of Present Illness: 74 yo WM with history of DM, HTN, hyperlipidemia, atrial flutter and CAD here today for cardiac follow up. He has multiple admissions for CAD and atrial flutter. Admitted to [facility] 10/02/12 with inferior STEMI and found to have occluded RCA treated with DES. Readmitted 02/17/13 with NSTEMI. DES placed in severe first OM stenosis. Noted to have diffuse moderate disease in all vessels. Stress myoview 08/07/13 with LVEF-=44%, inferior scar, no ischemia. Admitted to [facility] 11/03/12 with SOB and chest pain. He was found to be in atrial flutter with RVR. He converted to sinus on diltiazem drip. Echo 11/05/12 with normal LV size and function. He was started on Eliquis and discharged on 11/05/12. His metoprolol was increased to 50 mg po BID. I saw him February 2015 and he had c/o CP and fatigue, dyspnea. I arranged a cardiac cath on 12/26/13 and he was found to have high grade stenosis in the distal RCA. A 2.75 x 16 mm Promus Premier DES was placed in the distal RCA. He was seen in the ED January 4th and January 11th with atrial fibrillation and has had office follow up with Richardson Dopp, PA-C. His diltiazem was titrated but he developed a rash and he was changed to verapamil. He has since been managed with Lopressor and verapamil. Due to recurrent chest pain, cardiac cath was repeated on 11/28/14 and showed stable CAD. He presented back to the ED 12/03/14 with palpitations and was found to be in atrial flutter with RVR. Metoprolol was increased to 50 mg po BID. I saw him in the office 12/05/14 and he was in sinus brady. I stopped his verapamil and started amiodarone and continued his metoprolol.   He is here today for follow up.  Feeling well today. No chest pain or SOB. No recent palpitations. Still working. Tolerating amiodarone.   Primary Care Physician: [facility], NP) Urology: Joie Bimler, MD  Last  Lipid Profile:Lipid Panel     Component Value Date/Time   CHOL 170 11/04/2013 0617   TRIG 166* 11/04/2013 0617   HDL 34* 11/04/2013 0617   CHOLHDL 5.0 11/04/2013 0617   VLDL 33 11/04/2013 0617   LDLCALC 103* 11/04/2013 0617    Past Medical History  Diagnosis Date  . CAD (coronary artery disease), native coronary artery     a. s/p cath May 2011 >> DES distal RCA;  b. 01/2013 NSTEMI >> OM1 99p (2.75x12 Promus Premier DES);  c. LHC (2/15): dLM 20, oLAD 30, D1 with tandem 70, pOM1 stent ok w/ mid 50, both vessels dOM bifurc 50, AVCFX 60-70, pRCA 40, mRCA stent ok, dRCA 99 on prox edge of old stent, PL Br 50, PDA Br 60 followed by 80, EF 40% with inf HK >> PCI: Promus DES to dist RCA   . Hypertension   . Hyperlipidemia   . Diabetes mellitus     AODM  . Anemia, unspecified   . Colorectal polyps 2002  . Erectile dysfunction   . Diastolic dysfunction     a. 01/2013 Echo: EF 55%, mid-dist inflat HK, Gr1 DD, Triv AI/TR, Mild MR.;  b.  Echo (1/15): Mild LVH, EF 55%, inferolateral hypokinesis, grade 1 diastolic dysfunction, mild AI, trivial MR, moderate LAE, PASP 36 mmHg   . Atrial flutter     a. Dx 11/2013. Spont conv. Placed on eliquis.  . Acute renal insufficiency  a. During 11/2013 adm with atrial flutter.  . Myocardial infarction   . Hx of cardiovascular stress test     Stress myoview 08/07/13 with LVEF 44%, inferior scar, no ischemia    Past Surgical History  Procedure Laterality Date  . Wisdom tooth extraction    . Colonoscopy w/ polypectomy  2002    [facility]  . Infantile paralysis facial asymmetry    . Coronary stent placement  2007, 2011  . Colonoscopy w/ polypectomy  2004; 2007 negative  . Coronary angioplasty  12/26/2013  . Left heart catheterization with coronary angiogram N/A 10/02/2012    Procedure: LEFT HEART CATHETERIZATION WITH CORONARY ANGIOGRAM;  Surgeon: Peter M Martinique, MD;  Location: [facility] CATH LAB;  Service: Cardiovascular;  Laterality: N/A;  . Percutaneous coronary  stent intervention (pci-s)  10/02/2012    Procedure: PERCUTANEOUS CORONARY STENT INTERVENTION (PCI-S);  Surgeon: Peter M Martinique, MD;  Location: [facility] CATH LAB;  Service: Cardiovascular;;  . Left heart catheterization with coronary angiogram N/A 02/20/2013    Procedure: LEFT HEART CATHETERIZATION WITH CORONARY ANGIOGRAM;  Surgeon: Burnell Blanks, MD;  Location: [facility] CATH LAB;  Service: Cardiovascular;  Laterality: N/A;  . Percutaneous coronary stent intervention (pci-s)  02/20/2013    Procedure: PERCUTANEOUS CORONARY STENT INTERVENTION (PCI-S);  Surgeon: Burnell Blanks, MD;  Location: [facility] CATH LAB;  Service: Cardiovascular;;  . Left heart catheterization with coronary angiogram N/A 12/26/2013    Procedure: LEFT HEART CATHETERIZATION WITH CORONARY ANGIOGRAM;  Surgeon: Burnell Blanks, MD;  Location: [facility] CATH LAB;  Service: Cardiovascular;  Laterality: N/A;  . Percutaneous coronary stent intervention (pci-s)  12/26/2013    Procedure: PERCUTANEOUS CORONARY STENT INTERVENTION (PCI-S);  Surgeon: Burnell Blanks, MD;  Location: [facility] CATH LAB;  Service: Cardiovascular;;  . Left heart catheterization with coronary angiogram N/A 11/28/2014    Procedure: LEFT HEART CATHETERIZATION WITH CORONARY ANGIOGRAM;  Surgeon: Burnell Blanks, MD;  Location: [facility] CATH LAB;  Service: Cardiovascular;  Laterality: N/A;    Current Outpatient Prescriptions  Medication Sig Dispense Refill  . amiodarone (PACERONE) 200 MG tablet Take 200 mg by mouth daily.    [name] Kitchen apixaban (ELIQUIS) 5 MG TABS tablet Take 1 tablet (5 mg total) by mouth 2 (two) times daily. 60 tablet 11  . atorvastatin (LIPITOR) 20 MG tablet Take 1 tablet (20 mg total) by mouth daily. 90 tablet 3  . calcium carbonate (OS-CAL) 600 MG TABS tablet Take 600 mg by mouth daily with breakfast.     . cholecalciferol (VITAMIN D) 1000 UNITS tablet Take 1,000 Units by mouth daily.    . clopidogrel (PLAVIX) 75 MG tablet Take 1 tablet (75 mg total) by mouth  daily with breakfast. 90 tablet 3  . fexofenadine (ALLEGRA) 180 MG tablet Take 180 mg by mouth daily.      . isosorbide mononitrate (IMDUR) 30 MG 24 hr tablet Take 1 tablet (30 mg total) by mouth daily. 90 tablet 3  . lisinopril (PRINIVIL,ZESTRIL) 20 MG tablet Take 1 tablet (20 mg total) by mouth daily. 90 tablet 3  . metoprolol tartrate (LOPRESSOR) 50 MG tablet Take 1 tablet (50 mg total) by mouth 2 (two) times daily. 180 tablet 3  . Multiple Vitamin (MULTIVITAMIN) tablet Take 1 tablet by mouth daily.      . nitroGLYCERIN (NITROSTAT) 0.4 MG SL tablet Place 0.4 mg under the tongue every 5 (five) minutes as needed for chest pain.    . potassium chloride SA (K-DUR,KLOR-CON) 20 MEQ tablet Take 20 mEq by mouth  daily as needed (FOR LEG CRAMPS).    . vitamin E 100 UNIT capsule Take 100 Units by mouth daily.     No current facility-administered medications for this visit.    Allergies  Allergen Reactions  . Brilinta [Ticagrelor]     Arthralgias & myalgias  . Crestor [Rosuvastatin]     Myalgias (takes Zocor)  . Lipitor [Atorvastatin]     Myalgias (takes Zocor)    History   Social History  . Marital Status: Married    Spouse Name: N/A  . Number of Children: 1  . Years of Education: N/A   Occupational History  . maintenance     full time   Social History Main Topics  . Smoking status: Former Smoker    Quit date: 11/02/1984  . Smokeless tobacco: Never Used     Comment: smoked [place], up to 3 ppd (!)  . Alcohol Use: No  . Drug Use: No  . Sexual Activity: Not on file   Other Topics Concern  . Not on file   Social History Narrative    Family History  Problem Relation Age of Onset  . Heart attack Brother 78  . Colon cancer Brother   . Diabetes Neg Hx   . Stroke Neg Hx   . Hypertension Neg Hx     Review of Systems:  As stated in the HPI and otherwise negative.   BP 110/80 mmHg  Pulse 60  Ht 5\' 11"  (1.803 m)  Wt 196 lb (88.905 kg)  BMI 27.35 kg/m2  Physical  Examination: General: Well developed, well nourished, NAD HEENT: OP clear, mucus membranes moist SKIN: warm, dry. No rashes. Neuro: No focal deficits Musculoskeletal: Muscle strength 5/5 all ext Psychiatric: Mood and affect normal Neck: No JVD, no carotid bruits, no thyromegaly, no lymphadenopathy. Lungs:Clear bilaterally, no wheezes, rhonci, crackles Cardiovascular: Regular rate and rhythm. No murmurs, gallops or rubs. Abdomen:Soft. Bowel sounds present. Non-tender.  Extremities: No lower extremity edema. Pulses are 2 + in the bilateral DP/PT.  Cardiac cath 11/28/14: Left main: Distal 20% stenosis.  Left Anterior Descending Artery: Moderate caliber vessel that does not reach the apex. The ostium has a 30% stenosis. The mid vessel has diffuse plaque. The first diagonal branch is small to moderate in caliber (2.0 mm) and has tandem 70% stenoses which are unchanged from the last cath.  Circumflex Artery: Moderate caliber vessel with moderate caliber first obtuse marginal branch. The first OM branch arises early and has a patent stent in the proximal segment with no restenosis. The mid segment of the OM branch has a 50-60% stenosis. Distally the OM branch bifurcates and there is complex 50-60% stenosis involving both vessels beyond the branch point, non-flow limiting and unchanged from last cath. The AV groove Circumflex has 60-70% stenosis just after the takeoff of the OM branch as it is jailed by the OM stent, unchanged from last cath.  Right Coronary Artery: Large dominant vessel with severe calcification in the proximal and mid vessel. The proximal vessel has a 40% stenosis. The mid vessel has a patent stent with no restenosis. The distal vessel has a long stented segment that is patent without restenosis. The posterolateral branch is small in caliber and has a 50% stenosis. The PDA is small to moderate in caliber (2.25 mm vessel) and in the distal segment there is a 60% stenosis followed by a 80%  stenosis which is unchanged from the last cath.  Left Ventricular Angiogram: LVEF 35-40% with inferior wall  hypokinesis.    Echo 11/05/13: Left ventricle: The cavity size was at the upper limits of normal. Wall thickness was increased in a pattern of mild LVH. The estimated ejection fraction was 55%. There is hypokinesis of the basalinferolateral myocardium. Doppler parameters are consistent with abnormal left ventricular relaxation (grade 1 diastolic dysfunction). - Aortic valve: Mild regurgitation. - Mitral valve: Trivial regurgitation. - Left atrium: The atrium was moderately dilated. - Right atrium: Central venous pressure: 79mm Hg (est). - Atrial septum: No defect or patent foramen ovale was identified. - Tricuspid valve: Trivial regurgitation. - Pulmonary arteries: PA peak pressure: 46mm Hg (S). - Pericardium, extracardiac: There was no pericardial effusion  EKG:  EKG is not ordered today. The ekg ordered today demonstrates   Recent Labs: 11/07/2014: TSH 0.74 11/12/2014: B Natriuretic Peptide 111.9* 12/03/2014: BUN 19; Creatinine 1.49*; Hemoglobin 13.6; Platelets 220; Potassium 4.4; Sodium 139   Lipid Panel    Component Value Date/Time   CHOL 170 11/04/2013 0617   TRIG 166* 11/04/2013 0617   HDL 34* 11/04/2013 0617   CHOLHDL 5.0 11/04/2013 0617   VLDL 33 11/04/2013 0617   LDLCALC 103* 11/04/2013 0617   LDLDIRECT 69.3 11/26/2008 1256     Wt Readings from Last 3 Encounters:  02/12/15 196 lb (88.905 kg)  12/05/14 194 lb (87.998 kg)  11/21/14 196 lb (88.905 kg)     Other studies Reviewed: Additional studies/ records that were reviewed today include: . Review of the above records demonstrates:    Assessment and Plan:   1. CAD: Stable after recent cath January 2016. No chest pain. Continue Plavix, statin, beta blocker. No ASA with use of Eliquis.   2. HTN: BP is controlled. No changes.    3. Hyperlipidemia: He is on a statin. Lipids have been most recently checked in  primary care office. Will continue Lipitor 20 mg daily. He will ask for lipids to be checked in primary care as this if more convenient for him.   4. Atrial flutter, paroxysmal: Sinus today. Continue amiodarone and metoprolol. He is on Eliquis for anti-coagulation. Recent normal TSH, eye exam and LFTs. He will ask primary care to repeat TSH and LFTs this summer.   Current medicines are reviewed at length with the patient today.  The patient does not have concerns regarding medicines.  The following changes have been made:  no change  Labs/ tests ordered today include:  No orders of the defined types were placed in this encounter.    Disposition:   FU with me in 6 months  Signed, Lauree Chandler, MD 02/12/2015 4:01 PM    [facility] [place], [place], [place]  [address] Phone: [phone]; Fax: [phone]

## 2015-02-26 ENCOUNTER — Other Ambulatory Visit: Payer: Self-pay | Admitting: Cardiovascular Disease

## 2015-02-28 NOTE — Telephone Encounter (Signed)
No, primary care should refill. Thanks,chris

## 2015-02-28 NOTE — Telephone Encounter (Signed)
Gregory Bates should have primary care refill his metformin. cdm

## 2015-03-04 ENCOUNTER — Other Ambulatory Visit: Payer: Self-pay | Admitting: Cardiovascular Disease

## 2015-03-04 NOTE — Telephone Encounter (Signed)
Per refill note on 02/28/15 this should be refilled by primary care

## 2015-06-03 ENCOUNTER — Telehealth: Payer: Self-pay

## 2015-06-03 NOTE — Telephone Encounter (Signed)
Patient called in to verify MD discontinuing his Verapomil as of 12-05-14.

## 2015-07-29 ENCOUNTER — Other Ambulatory Visit: Payer: Self-pay | Admitting: Cardiovascular Disease

## 2015-07-29 NOTE — Telephone Encounter (Signed)
See previous refill notes.  This should be sent to primary care

## 2015-08-02 ENCOUNTER — Encounter (HOSPITAL_COMMUNITY): Payer: Self-pay | Admitting: Emergency Medicine

## 2015-08-02 ENCOUNTER — Emergency Department (HOSPITAL_COMMUNITY)
Admission: EM | Admit: 2015-08-02 | Discharge: 2015-08-03 | Disposition: A | Discharge status: Home / Self Care | Payer: BLUE CROSS/BLUE SHIELD | Attending: Emergency Medicine | Admitting: Emergency Medicine

## 2015-08-02 DIAGNOSIS — Z7901 Long term (current) use of anticoagulants: Secondary | ICD-10-CM | POA: Insufficient documentation

## 2015-08-02 DIAGNOSIS — I252 Old myocardial infarction: Secondary | ICD-10-CM | POA: Diagnosis not present

## 2015-08-02 DIAGNOSIS — Z9889 Other specified postprocedural states: Secondary | ICD-10-CM | POA: Diagnosis not present

## 2015-08-02 DIAGNOSIS — E785 Hyperlipidemia, unspecified: Secondary | ICD-10-CM | POA: Diagnosis not present

## 2015-08-02 DIAGNOSIS — Z87448 Personal history of other diseases of urinary system: Secondary | ICD-10-CM | POA: Insufficient documentation

## 2015-08-02 DIAGNOSIS — Z87438 Personal history of other diseases of male genital organs: Secondary | ICD-10-CM | POA: Insufficient documentation

## 2015-08-02 DIAGNOSIS — D649 Anemia, unspecified: Secondary | ICD-10-CM | POA: Insufficient documentation

## 2015-08-02 DIAGNOSIS — Z9289 Personal history of other medical treatment: Secondary | ICD-10-CM | POA: Diagnosis not present

## 2015-08-02 DIAGNOSIS — Z79899 Other long term (current) drug therapy: Secondary | ICD-10-CM | POA: Diagnosis not present

## 2015-08-02 DIAGNOSIS — E119 Type 2 diabetes mellitus without complications: Secondary | ICD-10-CM | POA: Diagnosis not present

## 2015-08-02 DIAGNOSIS — R112 Nausea with vomiting, unspecified: Secondary | ICD-10-CM | POA: Diagnosis not present

## 2015-08-02 DIAGNOSIS — I10 Essential (primary) hypertension: Secondary | ICD-10-CM | POA: Diagnosis not present

## 2015-08-02 DIAGNOSIS — I2510 Atherosclerotic heart disease of native coronary artery without angina pectoris: Secondary | ICD-10-CM | POA: Diagnosis not present

## 2015-08-02 DIAGNOSIS — R42 Dizziness and giddiness: Secondary | ICD-10-CM | POA: Diagnosis not present

## 2015-08-02 DIAGNOSIS — Z9861 Coronary angioplasty status: Secondary | ICD-10-CM | POA: Insufficient documentation

## 2015-08-02 DIAGNOSIS — Z87891 Personal history of nicotine dependence: Secondary | ICD-10-CM | POA: Insufficient documentation

## 2015-08-02 DIAGNOSIS — Z86010 Personal history of colonic polyps: Secondary | ICD-10-CM | POA: Diagnosis not present

## 2015-08-02 LAB — BASIC METABOLIC PANEL
Anion gap: 8 (ref 5–15)
BUN: 19 mg/dL (ref 6–20)
CALCIUM: 8.8 mg/dL — AB (ref 8.9–10.3)
CO2: 27 mmol/L (ref 22–32)
CREATININE: 1.56 mg/dL — AB (ref 0.61–1.24)
Chloride: 100 mmol/L — ABNORMAL LOW (ref 101–111)
GFR calc non Af Amer: 42 mL/min — ABNORMAL LOW (ref >60)
GFR, EST AFRICAN AMERICAN: 49 mL/min — AB (ref >60)
Glucose, Bld: 156 mg/dL — ABNORMAL HIGH (ref 65–99)
Potassium: 4.1 mmol/L (ref 3.5–5.1)
SODIUM: 135 mmol/L (ref 135–145)

## 2015-08-02 LAB — CBC
HCT: 35.4 % — ABNORMAL LOW (ref 39.0–52.0)
Hemoglobin: 11.4 g/dL — ABNORMAL LOW (ref 13.0–17.0)
MCH: 28.9 pg (ref 26.0–34.0)
MCHC: 32.2 g/dL (ref 30.0–36.0)
MCV: 89.6 fL (ref 78.0–100.0)
PLATELETS: 134 10*3/uL — AB (ref 150–400)
RBC: 3.95 MIL/uL — AB (ref 4.22–5.81)
RDW: 13.8 % (ref 11.5–15.5)
WBC: 6.4 10*3/uL (ref 4.0–10.5)

## 2015-08-02 LAB — HEPATIC FUNCTION PANEL
ALT: 32 U/L (ref 17–63)
AST: 28 U/L (ref 15–41)
Albumin: 3.8 g/dL (ref 3.5–5.0)
Alkaline Phosphatase: 63 U/L (ref 38–126)
Bilirubin, Direct: <0.1 mg/dL — ABNORMAL LOW (ref 0.1–0.5)
Total Bilirubin: 0.4 mg/dL (ref 0.3–1.2)
Total Protein: 6.5 g/dL (ref 6.5–8.1)

## 2015-08-02 LAB — LIPASE, BLOOD: LIPASE: 47 U/L (ref 22–51)

## 2015-08-02 LAB — CBG MONITORING, ED: GLUCOSE-CAPILLARY: 169 mg/dL — AB (ref 65–99)

## 2015-08-02 MED ORDER — MECLIZINE HCL 25 MG PO TABS
12.5000 mg | ORAL_TABLET | Freq: Once | ORAL | Status: AC
  Administered 2015-08-02: 12.5 mg via ORAL
  Filled 2015-08-02: qty 1

## 2015-08-02 NOTE — ED Notes (Signed)
CBG 169. Nurse notified.

## 2015-08-02 NOTE — ED Notes (Signed)
MD at bedside. 

## 2015-08-02 NOTE — ED Provider Notes (Signed)
CSN: [id number]     Arrival date & time 08/02/15  2214 History   First MD Initiated Contact with Patient 08/02/15 2319     Chief Complaint  Patient presents with  . Nausea  . Dizziness     (Consider location/radiation/quality/duration/timing/severity/associated sxs/prior Treatment) HPI Patient reports that after dinner he developed a sudden onset of dizziness with associated nausea and vomiting. He reports the dizziness had a spinning quality to it. He did not have any associated headache, visual changes, neurologic dysfunction or gait dysfunction. The patient's wife reports she took his blood pressure at home at that time and it was very elevated. That was apparently just after before he had vomited. She took it one more time and it was still elevated. She reports once the medics came and checked his blood pressure it had gone back down to about normal. She reports since he's been here in emergency department the blood pressures have been normal. At this time the symptoms are improved. He had a similar episode last week. He had a sudden onset of feeling dizzy and nauseated. He vomited twice and was seen by a outpatient provider within approximately 3 hours of onset of symptoms. He reports at that point time he was diagnosed with a "stomach flu" he denies he had any associated diarrhea at that point time. He denies that he felt physically ill. He does report that he was placed on a 15 day prescription but he cannot recall the name of it. Those symptoms had resolved within approximately 3-4 hours of their onset last week and he is felt well all week long. He felt well prior to dinner and symptoms only came on abruptly with dizziness and then vomiting this evening. At this point time the symptoms are again resolved. He had been given Zofran by EMS on route. Past Medical History  Diagnosis Date  . CAD (coronary artery disease), native coronary artery     a. s/p cath May 2011 >> DES distal RCA;  b.  01/2013 NSTEMI >> OM1 99p (2.75x12 Promus Premier DES);  c. LHC (2/15): dLM 20, oLAD 30, D1 with tandem 70, pOM1 stent ok w/ mid 50, both vessels dOM bifurc 50, AVCFX 60-70, pRCA 40, mRCA stent ok, dRCA 99 on prox edge of old stent, PL Br 50, PDA Br 60 followed by 80, EF 40% with inf HK >> PCI: Promus DES to dist RCA   . Hypertension   . Hyperlipidemia   . Diabetes mellitus     AODM  . Anemia, unspecified   . Colorectal polyps 2002  . Erectile dysfunction   . Diastolic dysfunction     a. 01/2013 Echo: EF 55%, mid-dist inflat HK, Gr1 DD, Triv AI/TR, Mild MR.;  b.  Echo (1/15): Mild LVH, EF 55%, inferolateral hypokinesis, grade 1 diastolic dysfunction, mild AI, trivial MR, moderate LAE, PASP 36 mmHg   . Atrial flutter     a. Dx 11/2013. Spont conv. Placed on eliquis.  . Acute renal insufficiency     a. During 11/2013 adm with atrial flutter.  . Myocardial infarction   . Hx of cardiovascular stress test     Stress myoview 08/07/13 with LVEF 44%, inferior scar, no ischemia   Past Surgical History  Procedure Laterality Date  . Wisdom tooth extraction    . Colonoscopy w/ polypectomy  2002    [facility]  . Infantile paralysis facial asymmetry    . Coronary stent placement  2007, 2011  . Colonoscopy w/ polypectomy  2004; 2007 negative  . Coronary angioplasty  12/26/2013  . Left heart catheterization with coronary angiogram N/A 10/02/2012    Procedure: LEFT HEART CATHETERIZATION WITH CORONARY ANGIOGRAM;  Surgeon: Peter M Martinique, MD;  Location: [facility] CATH LAB;  Service: Cardiovascular;  Laterality: N/A;  . Percutaneous coronary stent intervention (pci-s)  10/02/2012    Procedure: PERCUTANEOUS CORONARY STENT INTERVENTION (PCI-S);  Surgeon: Peter M Martinique, MD;  Location: [facility] CATH LAB;  Service: Cardiovascular;;  . Left heart catheterization with coronary angiogram N/A 02/20/2013    Procedure: LEFT HEART CATHETERIZATION WITH CORONARY ANGIOGRAM;  Surgeon: Burnell Blanks, MD;  Location: [facility] CATH LAB;   Service: Cardiovascular;  Laterality: N/A;  . Percutaneous coronary stent intervention (pci-s)  02/20/2013    Procedure: PERCUTANEOUS CORONARY STENT INTERVENTION (PCI-S);  Surgeon: Burnell Blanks, MD;  Location: [facility] CATH LAB;  Service: Cardiovascular;;  . Left heart catheterization with coronary angiogram N/A 12/26/2013    Procedure: LEFT HEART CATHETERIZATION WITH CORONARY ANGIOGRAM;  Surgeon: Burnell Blanks, MD;  Location[facility] CATH LAB;  Service: Cardiovascular;  Laterality: N/A;  . Percutaneous coronary stent intervention (pci-s)  12/26/2013    Procedure: PERCUTANEOUS CORONARY STENT INTERVENTION (PCI-S);  Surgeon: Burnell Blanks, MD;  Location: [facility] CATH LAB;  Service: Cardiovascular;;  . Left heart catheterization with coronary angiogram N/A 11/28/2014    Procedure: LEFT HEART CATHETERIZATION WITH CORONARY ANGIOGRAM;  Surgeon: Burnell Blanks, MD;  Location: [facility] CATH LAB;  Service: Cardiovascular;  Laterality: N/A;   Family History  Problem Relation Age of Onset  . Heart attack Brother 43  . Colon cancer Brother   . Diabetes Neg Hx   . Stroke Neg Hx   . Hypertension Neg Hx    Social History  Substance Use Topics  . Smoking status: Former Smoker    Quit date: 11/02/1984  . Smokeless tobacco: Never Used     Comment: smoked [place], up to 3 ppd (!)  . Alcohol Use: No    Review of Systems 10 Systems reviewed and are negative for acute change except as noted in the HPI.    Allergies  Brilinta; Crestor; and Lipitor  Home Medications   Prior to Admission medications   Medication Sig Start Date End Date Taking? Authorizing Provider  amiodarone (PACERONE) 200 MG tablet Take 200 mg by mouth daily.    Historical Provider, MD  apixaban (ELIQUIS) 5 MG TABS tablet Take 1 tablet (5 mg total) by mouth 2 (two) times daily. 09/20/14   Burnell Blanks, MD  atorvastatin (LIPITOR) 20 MG tablet Take 1 tablet (20 mg total) by mouth daily. 12/05/14   Burnell Blanks, MD  calcium carbonate (OS-CAL) 600 MG TABS tablet Take 600 mg by mouth daily with breakfast.     Historical Provider, MD  cholecalciferol (VITAMIN D) 1000 UNITS tablet Take 1,000 Units by mouth daily.    Historical Provider, MD  clopidogrel (PLAVIX) 75 MG tablet TAKE 1 TABLET EVERY DAY WITH BREAKFAST 02/28/15   Burnell Blanks, MD  fexofenadine (ALLEGRA) 180 MG tablet Take 180 mg by mouth daily.      Historical Provider, MD  isosorbide mononitrate (IMDUR) 30 MG 24 hr tablet Take 1 tablet (30 mg total) by mouth daily. 11/29/14   Burnell Blanks, MD  lisinopril (PRINIVIL,ZESTRIL) 20 MG tablet TAKE 1 TABLET EVERY DAY 02/28/15   Burnell Blanks, MD  meclizine (ANTIVERT) 12.5 MG tablet Take 1 tablet (12.5 mg total) by mouth 3 (three) times daily as needed  for dizziness. 08/03/15   Charlesetta Shanks, MD  metoprolol (LOPRESSOR) 50 MG tablet TAKE 1 TABLET TWICE DAILY 02/28/15   Burnell Blanks, MD  Multiple Vitamin (MULTIVITAMIN) tablet Take 1 tablet by mouth daily.      Historical Provider, MD  nitroGLYCERIN (NITROSTAT) 0.4 MG SL tablet Place 0.4 mg under the tongue every 5 (five) minutes as needed for chest pain.    Historical Provider, MD  potassium chloride SA (K-DUR,KLOR-CON) 20 MEQ tablet Take 20 mEq by mouth daily as needed (FOR LEG CRAMPS).    Historical Provider, MD  vitamin E 100 UNIT capsule Take 100 Units by mouth daily.    Historical Provider, MD   BP 134/53 mmHg  Pulse 50  Temp(Src) 98.6 F (37 C) (Oral)  Resp 13  Ht '5\' 11"'$  (1.803 m)  Wt 185 lb (83.915 kg)  BMI 25.81 kg/m2  SpO2 96% Physical Exam  Constitutional: He is oriented to person, place, and time. He appears well-developed and well-nourished. No distress.  HENT:  Head: Normocephalic and atraumatic.  Right Ear: External ear normal.  Left Ear: External ear normal.  Mouth/Throat: Oropharynx is clear and moist.  Bilateral TMs normal.  Eyes: EOM are normal. Pupils are equal, round, and reactive to  light.  Neck: Neck supple.  Cardiovascular: Normal rate, regular rhythm, normal heart sounds and intact distal pulses.   Pulmonary/Chest: Effort normal and breath sounds normal.  Abdominal: Soft. Bowel sounds are normal. He exhibits no distension. There is no tenderness.  Musculoskeletal: Normal range of motion. He exhibits no edema or tenderness.  Neurological: He is alert and oriented to person, place, and time. He has normal strength. No cranial nerve deficit. He exhibits normal muscle tone. Coordination normal. GCS eye subscore is 4. GCS verbal subscore is 5. GCS motor subscore is 6.  Dix-Hallpike maneuver positive for lateral nystagmus to the left.  Skin: Skin is warm, dry and intact.  Psychiatric: He has a normal mood and affect.    ED Course  Procedures (including critical care time) Labs Review Labs Reviewed  BASIC METABOLIC PANEL - Abnormal; Notable for the following:    Chloride 100 (*)    Glucose, Bld 156 (*)    Creatinine, Ser 1.56 (*)    Calcium 8.8 (*)    GFR calc non Af Amer 42 (*)    GFR calc Af Amer 49 (*)    All other components within normal limits  CBC - Abnormal; Notable for the following:    RBC 3.95 (*)    Hemoglobin 11.4 (*)    HCT 35.4 (*)    Platelets 134 (*)    All other components within normal limits  HEPATIC FUNCTION PANEL - Abnormal; Notable for the following:    Bilirubin, Direct <0.1 (*)    All other components within normal limits  CBG MONITORING, ED - Abnormal; Notable for the following:    Glucose-Capillary 169 (*)    All other components within normal limits  LIPASE, BLOOD  URINALYSIS, ROUTINE W REFLEX MICROSCOPIC (NOT AT [facility])  I-STAT TROPOININ, ED    Imaging Review No results found. I have personally reviewed and evaluated these images and lab results as part of my medical decision-making.   EKG Interpretation   Date/Time:  Friday August 02 2015 22:24:49 EDT Ventricular Rate:  55 PR Interval:  178 QRS Duration: 114 QT  Interval:  466 QTC Calculation: 446 R Axis:   64 Text Interpretation:  Sinus rhythm Borderline intraventricular conduction  delay Borderline repolarization  abnormality agree.no change Confirmed by  Johnney Killian, MD, Jeannie Done [phone]) on 08/02/2015 11:23:22 PM      MDM   Final diagnoses:  Vertigo   Patient's symptoms are consistent with positional vertigo. He has history of an episode last week that resolved within several hours is also consistent with vertigo. At this time he shows no signs of general constitutional illness or viral syndrome. He is otherwise well without associated chest pain headache or neurologic symptoms. Patient will be treated with Antivert as needed and advised for follow-up with his family physician.    Charlesetta Shanks, MD 08/03/15 [phone]

## 2015-08-02 NOTE — ED Notes (Signed)
Patient states that he only has nausea and dizziness when he moves and/or changes position.  Denies nausea and dizziness while sitting still.

## 2015-08-02 NOTE — ED Notes (Signed)
Patient comes from home c/o dizziness and nausea/vomiting since 1900 tonight, after he ate dinner.  Patient saw PCP for stomach flu last week and is currently on 15-day prescription abx.  Patient reports "this feels different, maybe food poisoning."  Patient given '4mg'$  Zofran PO en route by EMS.  EMS VS: 150/70, 95% RA, HR 51 Sinus Brady on monitor.  Patient does have cardiac hx: 5 stents, HTN, hyperliipidemia, diabetes.  Patient A&O x 4.  Denies pain.

## 2015-08-03 LAB — I-STAT TROPONIN, ED: TROPONIN I, POC: 0 ng/mL (ref 0.00–0.08)

## 2015-08-03 MED ORDER — MECLIZINE HCL 12.5 MG PO TABS: 12.5000 mg | ORAL_TABLET | Freq: Three times a day (TID) | ORAL | Status: DC | PRN

## 2015-08-03 NOTE — Discharge Instructions (Signed)
Benign Positional Vertigo Vertigo means you feel like you or your surroundings are moving when they are not. Benign positional vertigo is the most common form of vertigo. Benign means that the cause of your condition is not serious. Benign positional vertigo is more common in older adults. CAUSES  Benign positional vertigo is the result of an upset in the labyrinth system. This is an area in the middle ear that helps control your balance. This may be caused by a viral infection, head injury, or repetitive motion. However, often no specific cause is found. SYMPTOMS  Symptoms of benign positional vertigo occur when you move your head or eyes in different directions. Some of the symptoms may include:  Loss of balance and falls.  Vomiting.  Blurred vision.  Dizziness.  Nausea.  Involuntary eye movements (nystagmus). DIAGNOSIS  Benign positional vertigo is usually diagnosed by physical exam. If the specific cause of your benign positional vertigo is unknown, your caregiver may perform imaging tests, such as magnetic resonance imaging (MRI) or computed tomography (CT). TREATMENT  Your caregiver may recommend movements or procedures to correct the benign positional vertigo. Medicines such as meclizine, benzodiazepines, and medicines for nausea may be used to treat your symptoms. In rare cases, if your symptoms are caused by certain conditions that affect the inner ear, you may need surgery. HOME CARE INSTRUCTIONS   Follow your caregiver's instructions.  Move slowly. Do not make sudden body or head movements.  Avoid driving.  Avoid operating heavy machinery.  Avoid performing any tasks that would be dangerous to you or others during a vertigo episode.  Drink enough fluids to keep your urine clear or pale yellow. SEEK IMMEDIATE MEDICAL CARE IF:   You develop problems with walking, weakness, numbness, or using your arms, hands, or legs.  You have difficulty speaking.  You develop  severe headaches.  Your nausea or vomiting continues or gets worse.  You develop visual changes.  Your family or friends notice any behavioral changes.  Your condition gets worse.  You have a fever.  You develop a stiff neck or sensitivity to light. MAKE SURE YOU:   Understand these instructions.  Will watch your condition.  Will get help right away if you are not doing well or get worse. Document Released: 07/27/2006 Document Revised: 01/11/2012 Document Reviewed: 07/09/2011 ExitCare Patient Information 2015 [employer]. This information is not intended to replace advice given to you by your health care provider. Make sure you discuss any questions you have with your health care provider.    

## 2015-08-09 ENCOUNTER — Telehealth: Payer: Self-pay | Admitting: Cardiovascular Disease

## 2015-08-09 ENCOUNTER — Encounter: Payer: Self-pay | Admitting: Cardiovascular Disease

## 2015-08-09 NOTE — Telephone Encounter (Signed)
Records received from [facility] placed in chart prep bin.

## 2015-09-23 ENCOUNTER — Ambulatory Visit (INDEPENDENT_AMBULATORY_CARE_PROVIDER_SITE_OTHER): Payer: BLUE CROSS/BLUE SHIELD | Admitting: Cardiovascular Disease

## 2015-09-23 ENCOUNTER — Other Ambulatory Visit: Payer: Self-pay | Admitting: Cardiovascular Disease

## 2015-09-23 ENCOUNTER — Encounter: Payer: Self-pay | Admitting: Cardiovascular Disease

## 2015-09-23 VITALS — BP 112/50 | HR 50 | Ht 71.0 in | Wt 186.6 lb

## 2015-09-23 DIAGNOSIS — I2510 Atherosclerotic heart disease of native coronary artery without angina pectoris: Secondary | ICD-10-CM | POA: Diagnosis not present

## 2015-09-23 DIAGNOSIS — I10 Essential (primary) hypertension: Secondary | ICD-10-CM | POA: Diagnosis not present

## 2015-09-23 DIAGNOSIS — I484 Atypical atrial flutter: Secondary | ICD-10-CM | POA: Diagnosis not present

## 2015-09-23 DIAGNOSIS — E785 Hyperlipidemia, unspecified: Secondary | ICD-10-CM

## 2015-09-23 DIAGNOSIS — Z01810 Encounter for preprocedural cardiovascular examination: Secondary | ICD-10-CM

## 2015-09-23 NOTE — Patient Instructions (Addendum)
Medication Instructions:  Your physician recommends that you continue on your current medications as directed. Please refer to the Current Medication list given to you today.   Labwork: none  Testing/Procedures: none  Follow-Up: Your physician wants you to follow-up in: 6 months.  You will receive a reminder letter in the mail two months in advance. If you don't receive a letter, please call our office to schedule the follow-up appointment.    You may hold Plavix 5 days before surgery and Eliquis 2 days before surgery.  Surgeon will tell you when to resume.      If you need a refill on your cardiac medications before your next appointment, please call your pharmacy.

## 2015-09-23 NOTE — Progress Notes (Signed)
Chief Complaint  Patient presents with  . Follow-up    pt has upcoming surgery, needs cardiac clearance. pt c/o SOB with activity     History of Present Illness: 74 yo WM with history of DM, HTN, hyperlipidemia, atrial flutter and CAD here today for cardiac follow up. He has multiple admissions for CAD and atrial flutter. Admitted to [facility] 10/02/12 with inferior STEMI and found to have occluded RCA treated with DES. Readmitted 02/17/13 with NSTEMI. DES placed in severe first OM stenosis. Noted to have diffuse moderate disease in all vessels. Stress myoview 08/07/13 with LVEF-=44%, inferior scar, no ischemia. Admitted to [facility] 11/03/12 with SOB and chest pain. He was found to be in atrial flutter with RVR. He converted to sinus on diltiazem drip. Echo 11/05/12 with normal LV size and function. He was started on Eliquis and discharged on 11/05/12. His metoprolol was increased to 50 mg po BID. I saw him February 2015 and he had c/o CP and fatigue, dyspnea. I arranged a cardiac cath on 12/26/13 and he was found to have high grade stenosis in the distal RCA. A 2.75 x 16 mm Promus Premier DES was placed in the distal RCA. He was seen in the ED January 4th and January 11th with atrial fibrillation and has had office follow up with Richardson Dopp, PA-C. His diltiazem was titrated but he developed a rash and he was changed to verapamil. He has since been managed with Lopressor and verapamil. Due to recurrent chest pain, cardiac cath was repeated on 11/28/14 and showed stable CAD. He presented back to the ED 12/03/14 with palpitations and was found to be in atrial flutter with RVR. Metoprolol was increased to 50 mg po BID. I saw him in the office 12/05/14 and he was in sinus brady. I stopped his verapamil and started amiodarone and continued his metoprolol.   He is here today for follow up.  Feeling well today. No chest pain. He has SOB with moderate exertion. No recent palpitations. Still working. Tolerating amiodarone. Recent  labs in primary care with normal TSH and LFTs. He has an upcoming urological procedure.   Primary Care Physician: [facility], NP) Urology: Joie Bimler, MD   Past Medical History  Diagnosis Date  . CAD (coronary artery disease), native coronary artery     a. s/p cath May 2011 >> DES distal RCA;  b. 01/2013 NSTEMI >> OM1 99p (2.75x12 Promus Premier DES);  c. LHC (2/15): dLM 20, oLAD 30, D1 with tandem 70, pOM1 stent ok w/ mid 50, both vessels dOM bifurc 50, AVCFX 60-70, pRCA 40, mRCA stent ok, dRCA 99 on prox edge of old stent, PL Br 50, PDA Br 60 followed by 80, EF 40% with inf HK >> PCI: Promus DES to dist RCA   . Hypertension   . Hyperlipidemia   . Diabetes mellitus     AODM  . Anemia, unspecified   . Colorectal polyps 2002  . Erectile dysfunction   . Diastolic dysfunction     a. 01/2013 Echo: EF 55%, mid-dist inflat HK, Gr1 DD, Triv AI/TR, Mild MR.;  b.  Echo (1/15): Mild LVH, EF 55%, inferolateral hypokinesis, grade 1 diastolic dysfunction, mild AI, trivial MR, moderate LAE, PASP 36 mmHg   . Atrial flutter ([place])     a. Dx 11/2013. Spont conv. Placed on eliquis.  . Acute renal insufficiency     a. During 11/2013 adm with atrial flutter.  . Myocardial infarction ([place])   .  Hx of cardiovascular stress test     Stress myoview 08/07/13 with LVEF 44%, inferior scar, no ischemia    Past Surgical History  Procedure Laterality Date  . Wisdom tooth extraction    . Colonoscopy w/ polypectomy  2002    [facility]  . Infantile paralysis facial asymmetry    . Coronary stent placement  2007, 2011  . Colonoscopy w/ polypectomy  2004; 2007 negative  . Coronary angioplasty  12/26/2013  . Left heart catheterization with coronary angiogram N/A 10/02/2012    Procedure: LEFT HEART CATHETERIZATION WITH CORONARY ANGIOGRAM;  Surgeon: Peter M Martinique, MD;  Location: [facility] CATH LAB;  Service: Cardiovascular;  Laterality: N/A;  . Percutaneous coronary stent intervention (pci-s)   10/02/2012    Procedure: PERCUTANEOUS CORONARY STENT INTERVENTION (PCI-S);  Surgeon: Peter M Martinique, MD;  Location: [facility] CATH LAB;  Service: Cardiovascular;;  . Left heart catheterization with coronary angiogram N/A 02/20/2013    Procedure: LEFT HEART CATHETERIZATION WITH CORONARY ANGIOGRAM;  Surgeon: Burnell Blanks, MD;  Location: [facility] CATH LAB;  Service: Cardiovascular;  Laterality: N/A;  . Percutaneous coronary stent intervention (pci-s)  02/20/2013    Procedure: PERCUTANEOUS CORONARY STENT INTERVENTION (PCI-S);  Surgeon: Burnell Blanks, MD;  Location: [facility] CATH LAB;  Service: Cardiovascular;;  . Left heart catheterization with coronary angiogram N/A 12/26/2013    Procedure: LEFT HEART CATHETERIZATION WITH CORONARY ANGIOGRAM;  Surgeon: Burnell Blanks, MD;  Location: [facility] CATH LAB;  Service: Cardiovascular;  Laterality: N/A;  . Percutaneous coronary stent intervention (pci-s)  12/26/2013    Procedure: PERCUTANEOUS CORONARY STENT INTERVENTION (PCI-S);  Surgeon: Burnell Blanks, MD;  Location: [facility] CATH LAB;  Service: Cardiovascular;;  . Left heart catheterization with coronary angiogram N/A 11/28/2014    Procedure: LEFT HEART CATHETERIZATION WITH CORONARY ANGIOGRAM;  Surgeon: Burnell Blanks, MD;  Location: [facility] CATH LAB;  Service: Cardiovascular;  Laterality: N/A;    Current Outpatient Prescriptions  Medication Sig Dispense Refill  . amiodarone (PACERONE) 200 MG tablet Take 200 mg by mouth daily.    [name] Kitchen apixaban (ELIQUIS) 5 MG TABS tablet Take 1 tablet (5 mg total) by mouth 2 (two) times daily. 60 tablet 11  . atorvastatin (LIPITOR) 20 MG tablet Take 1 tablet (20 mg total) by mouth daily. 90 tablet 3  . calcium carbonate (OS-CAL) 600 MG TABS tablet Take 600 mg by mouth daily with breakfast.     . cholecalciferol (VITAMIN D) 1000 UNITS tablet Take 1,000 Units by mouth daily.    . clopidogrel (PLAVIX) 75 MG tablet TAKE 1 TABLET EVERY DAY WITH BREAKFAST 90 tablet 3  .  fexofenadine (ALLEGRA) 180 MG tablet Take 180 mg by mouth daily.      . isosorbide mononitrate (IMDUR) 30 MG 24 hr tablet Take 1 tablet (30 mg total) by mouth daily. 90 tablet 3  . lisinopril (PRINIVIL,ZESTRIL) 20 MG tablet TAKE 1 TABLET EVERY DAY 90 tablet 3  . meclizine (ANTIVERT) 12.5 MG tablet Take 1 tablet (12.5 mg total) by mouth 3 (three) times daily as needed for dizziness. 30 tablet 0  . metoprolol (LOPRESSOR) 50 MG tablet TAKE 1 TABLET TWICE DAILY 180 tablet 3  . Multiple Vitamin (MULTIVITAMIN) tablet Take 1 tablet by mouth daily.      . nitroGLYCERIN (NITROSTAT) 0.4 MG SL tablet Place 0.4 mg under the tongue every 5 (five) minutes as needed for chest pain.    . potassium chloride SA (K-DUR,KLOR-CON) 20 MEQ tablet Take 20 mEq by mouth daily as needed (FOR  LEG CRAMPS).    . vitamin E 100 UNIT capsule Take 100 Units by mouth daily.     No current facility-administered medications for this visit.    Allergies  Allergen Reactions  . Brilinta [Ticagrelor] Other (See Comments)    Arthralgias & myalgias  . Crestor [Rosuvastatin] Other (See Comments)    Myalgias (takes Zocor)  . Lipitor [Atorvastatin] Other (See Comments)    Myalgias (takes Zocor)    Social History   Social History  . Marital Status: Married    Spouse Name: N/A  . Number of Children: 1  . Years of Education: N/A   Occupational History  . maintenance     full time   Social History Main Topics  . Smoking status: Former Smoker    Quit date: 11/02/1984  . Smokeless tobacco: Never Used     Comment: smoked [place], up to 3 ppd (!)  . Alcohol Use: No  . Drug Use: No  . Sexual Activity: Not on file   Other Topics Concern  . Not on file   Social History Narrative    Family History  Problem Relation Age of Onset  . Heart attack Brother 6  . Colon cancer Brother   . Diabetes Neg Hx   . Stroke Neg Hx   . Hypertension Neg Hx     Review of Systems:  As stated in the HPI and otherwise negative.    BP 112/50 mmHg  Pulse 50  Ht '5\' 11"'$  (1.803 m)  Wt 186 lb 9.6 oz (84.641 kg)  BMI 26.04 kg/m2  SpO2 98%  Physical Examination: General: Well developed, well nourished, NAD HEENT: OP clear, mucus membranes moist SKIN: warm, dry. No rashes. Neuro: No focal deficits Musculoskeletal: Muscle strength 5/5 all ext Psychiatric: Mood and affect normal Neck: No JVD, no carotid bruits, no thyromegaly, no lymphadenopathy. Lungs:Clear bilaterally, no wheezes, rhonci, crackles Cardiovascular: Regular rate and rhythm. No murmurs, gallops or rubs. Abdomen:Soft. Bowel sounds present. Non-tender.  Extremities: No lower extremity edema. Pulses are 2 + in the bilateral DP/PT.  Cardiac cath 11/28/14: Left main: Distal 20% stenosis.  Left Anterior Descending Artery: Moderate caliber vessel that does not reach the apex. The ostium has a 30% stenosis. The mid vessel has diffuse plaque. The first diagonal branch is small to moderate in caliber (2.0 mm) and has tandem 70% stenoses which are unchanged from the last cath.  Circumflex Artery: Moderate caliber vessel with moderate caliber first obtuse marginal branch. The first OM branch arises early and has a patent stent in the proximal segment with no restenosis. The mid segment of the OM branch has a 50-60% stenosis. Distally the OM branch bifurcates and there is complex 50-60% stenosis involving both vessels beyond the branch point, non-flow limiting and unchanged from last cath. The AV groove Circumflex has 60-70% stenosis just after the takeoff of the OM branch as it is jailed by the OM stent, unchanged from last cath.  Right Coronary Artery: Large dominant vessel with severe calcification in the proximal and mid vessel. The proximal vessel has a 40% stenosis. The mid vessel has a patent stent with no restenosis. The distal vessel has a long stented segment that is patent without restenosis. The posterolateral branch is small in caliber and has a 50% stenosis.  The PDA is small to moderate in caliber (2.25 mm vessel) and in the distal segment there is a 60% stenosis followed by a 80% stenosis which is unchanged from the last cath.  Left Ventricular Angiogram: LVEF 35-40% with inferior wall hypokinesis.    Echo 11/05/13: Left ventricle: The cavity size was at the upper limits of normal. Wall thickness was increased in a pattern of mild LVH. The estimated ejection fraction was 55%. There is hypokinesis of the basalinferolateral myocardium. Doppler parameters are consistent with abnormal left ventricular relaxation (grade 1 diastolic dysfunction). - Aortic valve: Mild regurgitation. - Mitral valve: Trivial regurgitation. - Left atrium: The atrium was moderately dilated. - Right atrium: Central venous pressure: 86m Hg (est). - Atrial septum: No defect or patent foramen ovale was identified. - Tricuspid valve: Trivial regurgitation. - Pulmonary arteries: PA peak pressure: 344mHg (S). - Pericardium, extracardiac: There was no pericardial effusion  EKG:  EKG is not ordered today. The ekg ordered today demonstrates   Recent Labs: 11/07/2014: TSH 0.74 11/12/2014: B Natriuretic Peptide 111.9* 08/02/2015: ALT 32; BUN 19; Creatinine, Ser 1.56*; Hemoglobin 11.4*; Platelets 134*; Potassium 4.1; Sodium 135      Wt Readings from Last 3 Encounters:  09/23/15 186 lb 9.6 oz (84.641 kg)  08/02/15 185 lb (83.915 kg)  02/12/15 196 lb (88.905 kg)     Other studies Reviewed: Additional studies/ records that were reviewed today include: . Review of the above records demonstrates:    Assessment and Plan:   1. CAD: Stable after cath January 2016. No chest pain. Continue Plavix, statin, beta blocker. No ASA with use of Eliquis. Given his upcoming surgery, would proceed but he will  Need to hold Plavix 5 days before the procedure and Eliquis 2 days before the procedure.   2. HTN: BP is controlled. No changes.    3. Hyperlipidemia: He is on a statin. Lipids  followed in primary care and well controlled. (  4. Atrial flutter, paroxysmal: Sinus today. Continue amiodarone and metoprolol. He is on Eliquis for anti-coagulation. Recent normal TSH, eye exam and LFTs.   5. Pre-operative cardiovascular examination: His cardiac disease is stable. No recent symptoms worrisome for angina or CHF. He can proceed with his planned surgical procedure. He is instructed to hold Plavix 5 days before his procedure and hold his Eliquis 2 days before his procedure.   Current medicines are reviewed at length with the patient today.  The patient does not have concerns regarding medicines.  The following changes have been made:  no change  Labs/ tests ordered today include:  No orders of the defined types were placed in this encounter.    Disposition:   FU with me in 6 months  Signed, ChLauree ChandlerMD 09/23/2015 10:24 AM    [facility] [id number]  [id number]: [phone]: [phone]

## 2015-10-03 ENCOUNTER — Other Ambulatory Visit: Payer: Self-pay | Admitting: Urology

## 2015-10-14 ENCOUNTER — Ambulatory Visit: Payer: BLUE CROSS/BLUE SHIELD | Admitting: Cardiovascular Disease

## 2015-11-04 ENCOUNTER — Other Ambulatory Visit: Payer: Self-pay | Admitting: Cardiovascular Disease

## 2015-11-13 ENCOUNTER — Telehealth: Payer: Self-pay | Admitting: Cardiovascular Disease

## 2015-11-13 NOTE — Telephone Encounter (Signed)
Called patient to let him know that we could not refill his med because it was discontinued in Feb 2016.

## 2015-11-13 NOTE — Telephone Encounter (Signed)
New message        *STAT* If patient is at the pharmacy, call can be transferred to refill team.   1. Which medications need to be refilled? (please list name of each medication and dose if known) verapamiler '180mg'$  2. Which pharmacy/location (including street and city if local pharmacy) is medication to be sent to? [facility]---fax [phone]  3. Do they need a 30 day or 90 day supply? 90 day supply

## 2015-12-02 ENCOUNTER — Telehealth: Payer: Self-pay | Admitting: Cardiovascular Disease

## 2015-12-02 NOTE — Telephone Encounter (Signed)
Thanks. Gregory Bates 

## 2015-12-02 NOTE — Telephone Encounter (Signed)
New message  Pt c/o Shortness Of Breath: STAT if SOB developed within the last 24 hours or pt is noticeably SOB on the phone  1. Are you currently SOB (can you hear that pt is SOB on the phone)? No  2. How long have you been experiencing SOB? About 1 week.  3. Are you SOB when sitting or when up moving around? Moving around with Exertion  4.  Are you currently experiencing any other symptoms? No

## 2015-12-02 NOTE — Telephone Encounter (Signed)
Spoke with pt. He reports shortness of breath with exertion such as walking or lifting anything. Shortness of breath goes away with rest. No palpitations or increased heart rate. Reports heart rate around 58.   Office visit has been scheduled for pt to see Richardson Dopp, PA on 12/05/15.  I offered to arrange earlier appt in office but pt would like to wait until 2/2 appt.  I instructed pt to contact us or go to ED if symptoms worsen prior to upcoming appointment.

## 2015-12-04 NOTE — Progress Notes (Signed)
Cardiology Office Note:    Date:  12/05/2015   ID:  Gregory Bates, DOB Apr 08, 1941, MRN [id number]  PCP:  Imagene Riches, NP  Cardiologist:  Dr. Lauree Chandler   Electrophysiologist:  n/a  Chief Complaint  Patient presents with  . Shortness of Breath     History of Present Illness:     Gregory Bates is a 75 y.o. male with a hx of CAD status post inferior STEMI in 10/2012 treated with a DES to the RCA, non-STEMI in 01/2013 treated with a DES to the OM1, and unstable angina in 12/2013 treated with a DES to the distal RCA. Other history includes DM2, HTN, HL, paroxysmal atrial flutter. He is on a apixaban for anticoagulation.   He was seen in the emergency room 11/05/14 with chest pain in the setting of atrial fibrillation with RVR. He spontaneously converted to NSR. Cardiac markers remained normal. Atrial fibrillation had been managed with rate controlling medications. He had recurrent angina and cardiac catheterization was repeated in 1/16. This demonstrated stable CAD and medical therapy was continued. He was seen in the emergency room 2/16 with AF with RVR. He is now on Amiodarone.  Last seen by Dr. Angelena Form 11/16.  He returns today with complaints of dyspnea with exertion with associated chest discomfort over the past several weeks. It seems to be getting worse. He describes NYHA 2-2b symptoms. He denies orthopnea. He does describe some PND. Denies edema. Denies fevers, chills, cough, wheezing, melena, hematochezia, hematuria. He denies syncope. He feels like his symptoms are similar to his previous angina. Symptoms are no worse than what he had last year prior to repeat cardiac catheterization. He does not check his blood pressures at home. He has noticed frequent headaches recently.  Past Medical History  Diagnosis Date  . CAD (coronary artery disease), native coronary artery     a. s/p cath May 2011 >> DES distal RCA;  b. 01/2013 NSTEMI >> OM1 99p (2.75x12 Promus Premier DES);  c.  LHC (2/15): PCI: Promus DES to dist RCA ;  d. LHC 1/16 - dLM 58, oLAD 30, small D1 tandem 32, pOM1 stent ok, mOM 50-60, dOM bifurcation 50-60 in both vessels, AV 58-70 jailed by stent, pRCA 91 mRCA stent ok, dRCA stent ok, PLB smal 50, dPDA 60, 80; EF 35-40 >> med Rx - PCI of PDA if angina  . Hypertension   . Hyperlipidemia   . Diabetes mellitus     AODM  . Anemia, unspecified   . Colorectal polyps 2002  . Erectile dysfunction   . History of echocardiogram     a. 01/2013 Echo: EF 55%, mid-dist inflat HK, Gr1 DD, Triv AI/TR, Mild MR.;  b.  Echo (1/15): Mild LVH, EF 55%, inferolateral hypokinesis, grade 1 diastolic dysfunction, mild AI, trivial MR, moderate LAE, PASP 36 mmHg   . PAF (paroxysmal atrial fibrillation) ([place])     a. Dx 11/2013. Spont conv. Placed on eliquis.  . Acute renal insufficiency     a. During 11/2013 adm with atrial flutter.  . Myocardial infarction ([place])   . Hx of cardiovascular stress test     Stress myoview 08/07/13 with LVEF 44%, inferior scar, no ischemia  . Ischemic cardiomyopathy     a. EF 35-40% by LHC in 1/16    Past Surgical History  Procedure Laterality Date  . Wisdom tooth extraction    . Colonoscopy w/ polypectomy  2002    [facility]  . Infantile paralysis facial asymmetry    .  Coronary stent placement  2007, 2011  . Colonoscopy w/ polypectomy  2004; 2007 negative  . Coronary angioplasty  12/26/2013  . Left heart catheterization with coronary angiogram N/A 10/02/2012    Procedure: LEFT HEART CATHETERIZATION WITH CORONARY ANGIOGRAM;  Surgeon: Peter M Martinique, MD;  Location[facility] C[facility] LAB;  Service: Cardiovascular;  Laterality: N/A;  . Percutaneous coronary stent intervention (pci-s)  10/02/2012    Procedure: PERCUTANEOUS CORONARY STENT INTERVENTION (PCI-S);  Surgeon: Peter M Martinique, MD;  Location: [facility] CATH LAB;  Service: Cardiovascular;;  . Left heart catheterization with coronary angiogram N/A 02/20/2013    Procedure: LEFT HEART CATHETERIZATION WITH CORONARY  ANGIOGRAM;  Surgeon: Burnell Blanks, MD;  Location: [facility] CATH LAB;  Service: Cardiovascular;  Laterality: N/A;  . Percutaneous coronary stent intervention (pci-s)  02/20/2013    Procedure: PERCUTANEOUS CORONARY STENT INTERVENTION (PCI-S);  Surgeon: Burnell Blanks, MD;  Location: [facility] CATH LAB;  Service: Cardiovascular;;  . Left heart catheterization with coronary angiogram N/A 12/26/2013    Procedure: LEFT HEART CATHETERIZATION WITH CORONARY ANGIOGRAM;  Surgeon: Burnell Blanks, MD;  Location: [facility] CATH LAB;  Service: Cardiovascular;  Laterality: N/A;  . Percutaneous coronary stent intervention (pci-s)  12/26/2013    Procedure: PERCUTANEOUS CORONARY STENT INTERVENTION (PCI-S);  Surgeon: Burnell Blanks, MD;  Location: [facility] CATH LAB;  Service: Cardiovascular;;  . Left heart catheterization with coronary angiogram N/A 11/28/2014    Procedure: LEFT HEART CATHETERIZATION WITH CORONARY ANGIOGRAM;  Surgeon: Burnell Blanks, MD;  Location: [facility] CATH LAB;  Service: Cardiovascular;  Laterality: N/A;    Current Medications: Outpatient Prescriptions Prior to Visit  Medication Sig Dispense Refill  . amiodarone (PACERONE) 200 MG tablet TAKE 1 TABLET EVERY DAY 90 tablet 1  . atorvastatin (LIPITOR) 20 MG tablet TAKE 1 TABLET EVERY DAY  (STOP  SIMVASTATIN) 90 tablet 1  . cholecalciferol (VITAMIN D) 1000 UNITS tablet Take 1,000 Units by mouth daily.    [name] Kitchen ELIQUIS 5 MG TABS tablet TAKE ONE TABLET BY MOUTH TWICE DAILY 60 tablet 6  . fexofenadine (ALLEGRA) 180 MG tablet Take 180 mg by mouth daily.      . isosorbide mononitrate (IMDUR) 30 MG 24 hr tablet TAKE 1 TABLET EVERY DAY 90 tablet 1  . lisinopril (PRINIVIL,ZESTRIL) 20 MG tablet TAKE 1 TABLET EVERY DAY 90 tablet 3  . meclizine (ANTIVERT) 12.5 MG tablet Take 1 tablet (12.5 mg total) by mouth 3 (three) times daily as needed for dizziness. 30 tablet 0  . metoprolol (LOPRESSOR) 50 MG tablet TAKE 1 TABLET TWICE DAILY 180 tablet 3  . Multiple  Vitamin (MULTIVITAMIN) tablet Take 1 tablet by mouth daily.      . nitroGLYCERIN (NITROSTAT) 0.4 MG SL tablet Place 0.4 mg under the tongue every 5 (five) minutes as needed for chest pain.    . potassium chloride SA (K-DUR,KLOR-CON) 20 MEQ tablet Take 20 mEq by mouth daily as needed (FOR LEG CRAMPS).    . vitamin E 100 UNIT capsule Take 100 Units by mouth daily.    . clopidogrel (PLAVIX) 75 MG tablet TAKE 1 TABLET EVERY DAY WITH BREAKFAST 90 tablet 3  . calcium carbonate (OS-CAL) 600 MG TABS tablet Take 600 mg by mouth daily with breakfast. Reported on 12/05/2015     No facility-administered medications prior to visit.     Allergies:   Brilinta; Crestor; and Lipitor   Social History   Social History  . Marital Status: Married    Spouse Name: N/A  . Number of Children:  1  . Years of Education: N/A   Occupational History  . maintenance     full time   Social History Main Topics  . Smoking status: Former Smoker    Quit date: 11/02/1984  . Smokeless tobacco: Never Used     Comment: smoked [place], up to 3 ppd (!)  . Alcohol Use: No  . Drug Use: No  . Sexual Activity: Not Asked   Other Topics Concern  . None   Social History Narrative     Family History:  The patient's family history includes Colon cancer in his brother; Heart attack (age of onset: 36) in his brother. There is no history of Diabetes, Stroke, or Hypertension.   ROS:   Please see the history of present illness.    Review of Systems  HENT: Positive for headaches.   Cardiovascular: Positive for chest pain.  Respiratory: Positive for shortness of breath.   Hematologic/Lymphatic: Bruises/bleeds easily.  Gastrointestinal: Positive for abdominal pain.  All other systems reviewed and are negative.   Physical Exam:    VS:  BP 152/60 mmHg  Pulse 60  Ht '5\' 11"'$  (1.803 m)  Wt 192 lb 1.9 oz (87.145 kg)  BMI 26.81 kg/m2  SpO2 93%   GEN: Well nourished, well developed, in no acute distress HEENT: normal Neck:  no JVD, no masses Cardiac: Normal S1/S2, RRR; no murmurs, no edema   Respiratory:  clear to auscultation bilaterally; no wheezing, rhonchi or rales GI: soft, nontender  MS: no deformity or atrophy Skin: warm and dry, no rash Neuro:    no focal deficits  Psych: Alert and oriented x 3, normal affect  Wt Readings from Last 3 Encounters:  12/05/15 192 lb 1.9 oz (87.145 kg)  09/23/15 186 lb 9.6 oz (84.641 kg)  08/02/15 185 lb (83.915 kg)      Studies/Labs Reviewed:     EKG:  EKG is  ordered today.  The ekg ordered today demonstrates NSR, HR 60, normal axis, nonspecific ST-T wave changes, QTc 444 ms, no change from prior tracing  Recent Labs: 08/02/2015: ALT 32; BUN 19; Creatinine, Ser 1.56*; Hemoglobin 11.4*; Platelets 134*; Potassium 4.1; Sodium 135   Recent Lipid Panel    Component Value Date/Time   CHOL 170 11/04/2013 0617   TRIG 166* 11/04/2013 0617   HDL 34* 11/04/2013 0617   CHOLHDL 5.0 11/04/2013 0617   VLDL 33 11/04/2013 0617   LDLCALC 103* 11/04/2013 0617   LDLDIRECT 69.3 11/26/2008 1256    Additional studies/ records that were reviewed today include:   LHC 11/28/14 Left main: Distal 20% stenosis.  Left Anterior Descending Artery:  Ostial 30%, small to moderate D1 with tandem 70%  Circumflex Artery: OM1 proximal stent patent, mid OM 50-60%, distal OM bifurcation complex 50-60% involving both vessels at branch point, AV groove 60-70%-jailed by OM stent  Right Coronary Artery: Proximal 40%, mid stent patent, distal stent patent, PLB small-caliber 50%, PDA small to moderate distal 60%, 80%  Left Ventricular Angiogram: LVEF 35-40% with inferior wall hypokinesis.  Impression: 1. Triple vessel CAD 2. Patent stents in the RCA and OM 3. Moderate disease in the PDA and the OM1 4. Moderate LV systolic dysfunction Recommendations: Will continue medical management of CAD. No focal targets for PCI. His PDA does have moderately severe disease but intervention of this vessel would  be very difficult. The RCA is heavily calcified and tortuous with multiple stents in the mid and distal vessel. Delivering a stent to the PDA would  be difficult. Since this area has not changed over the last year, PCI of the PDA will not be attempted today. I will increase his Imdur to 15 mg po BID as he reports feeling great in the am but chest pain in the afternoon. If his angina cannot be controlled with medical therapy, will have to consider PCI of the PDA and possibly the small OM branch.   Echo (1/15):  Mild LVH, EF 55%, inferolateral hypokinesis, grade 1 diastolic dysfunction, mild AI, trivial MR, moderate LAE, PASP 36 mmHg   LHC (2/15):  dLM 20, oLAD 30, D1 with tandem 70, pOM1 stent ok w/ mid 50, both vessels dOM bifurc 50, AVCFX 60-70, pRCA 40, mRCA stent ok, dRCA 99 on prox edge of old stent, PL Br 50, PDA Br 60 followed by 80, EF 40% with inf HK >> PCI: Promus DES to dist RCA  Stress myoview 08/07/13 LVEF 44%, inferior scar, no ischemia   ASSESSMENT:     1. Coronary artery disease involving native coronary artery of native heart with angina pectoris ([place])   2. PAF (paroxysmal atrial fibrillation) ([place])   3. Ischemic cardiomyopathy   4. Shortness of breath   5. Essential hypertension   6. Hyperlipidemia     PLAN:     In order of problems listed above:  1. CAD - He presents to the office today with complaints of CCS class II angina and associated dyspnea. His symptoms are similar to his symptoms prior to cardiac catheterization last year without significant change. They started to recur about 3-4 weeks ago. His blood pressure is running higher than usual. This may be driving some of his symptoms. In light of his recent headaches, I do not think that adjusting his nitrates would be completely wise. Cardiac catheterization in 1/16 did demonstrate stable anatomy. There was some disease in the distal PDA as well as OM branch that could be approached with PCI if angina was not  controlled with medical therapy.  -  Continue Plavix, beta blocker, statin  -  Start amlodipine 5 mg daily  -  Obtain BMET, CBC, BNP  -  Arrange ETT-Myoview. If ischemia localized in the PDA territory and symptoms progress, consider PCI  2. PAF - Maintaining NSR. Continue amiodarone, beta blocker, Eliquis. CHADS2-VASc=5.    -  Obtain follow-up LFTs, TSH  3. Ischemic cardiomyopathy - EF was 35-40% at cardiac cath is a last year. Reassess EF on nuclear stress test. Continue beta blocker, ACE inhibitor. He does not appear to be volume overloaded. Check BNP today. Add Lasix if significantly elevated.  4. Dyspnea - As noted check BNP.  5. HTN - Uncontrolled. Continue isosorbide, lisinopril, metoprolol. Add amlodipine 5 mg daily.  6. HL - Continue statin.    Medication Adjustments/Labs and Tests Ordered: Current medicines are reviewed at length with the patient today.  Concerns regarding medicines are outlined above.  Medication changes, Labs and Tests ordered today are outlined in the Patient Instructions noted below. Patient Instructions  Medication Instructions:  1. START NORVASC 5 MG DAILY; RX SENT TO [facility] IN [place], [place]  Labwork: 1. TODAY BMET, CBC W/DIFF, TSH, BNP, LFT  Testing/Procedures: Your physician has requested that you have en exercise stress myoview. pT WOULD LIKE A Monday OR Friday  For further information please visit [email or web address]. Please follow instruction sheet, as given.   Follow-Up: Neftali Abair, PAC 2-3 WEEKS SAME DAY DR. Angelena Form IS IN THE OFFICE  Any Other Special Instructions Will Be Listed Below (  If Applicable).     If you need a refill on your cardiac medications before your next appointment, please call your pharmacy.      Signed, Richardson Dopp, PA-C  12/05/2015 1:03 PM    [facility] [address], [place], [place]  [address] Phone: [phone]; Fax: [phone]

## 2015-12-05 ENCOUNTER — Ambulatory Visit (INDEPENDENT_AMBULATORY_CARE_PROVIDER_SITE_OTHER): Payer: BLUE CROSS/BLUE SHIELD | Admitting: Physician Assistant

## 2015-12-05 ENCOUNTER — Other Ambulatory Visit: Payer: Self-pay | Admitting: *Deleted

## 2015-12-05 ENCOUNTER — Encounter: Payer: Self-pay | Admitting: Physician Assistant

## 2015-12-05 VITALS — BP 152/60 | HR 60 | Ht 71.0 in | Wt 192.1 lb

## 2015-12-05 DIAGNOSIS — E785 Hyperlipidemia, unspecified: Secondary | ICD-10-CM

## 2015-12-05 DIAGNOSIS — I480 Paroxysmal atrial fibrillation: Secondary | ICD-10-CM | POA: Diagnosis not present

## 2015-12-05 DIAGNOSIS — I10 Essential (primary) hypertension: Secondary | ICD-10-CM

## 2015-12-05 DIAGNOSIS — R0602 Shortness of breath: Secondary | ICD-10-CM

## 2015-12-05 DIAGNOSIS — I25119 Atherosclerotic heart disease of native coronary artery with unspecified angina pectoris: Secondary | ICD-10-CM

## 2015-12-05 DIAGNOSIS — I255 Ischemic cardiomyopathy: Secondary | ICD-10-CM

## 2015-12-05 LAB — BASIC METABOLIC PANEL
BUN: 19 mg/dL (ref 7–25)
CALCIUM: 9.2 mg/dL (ref 8.6–10.3)
CO2: 27 mmol/L (ref 20–31)
CREATININE: 1.51 mg/dL — AB (ref 0.70–1.18)
Chloride: 103 mmol/L (ref 98–110)
GLUCOSE: 144 mg/dL — AB (ref 65–99)
POTASSIUM: 4.4 mmol/L (ref 3.5–5.3)
Sodium: 140 mmol/L (ref 135–146)

## 2015-12-05 LAB — CBC WITH DIFFERENTIAL/PLATELET
Basophils Absolute: 0.1 10*3/uL (ref 0.0–0.1)
Basophils Relative: 1 % (ref 0–1)
EOS ABS: 0.3 10*3/uL (ref 0.0–0.7)
EOS PCT: 4 % (ref 0–5)
HEMATOCRIT: 33.2 % — AB (ref 39.0–52.0)
Hemoglobin: 10.6 g/dL — ABNORMAL LOW (ref 13.0–17.0)
LYMPHS PCT: 21 % (ref 12–46)
Lymphs Abs: 1.6 10*3/uL (ref 0.7–4.0)
MCH: 28.3 pg (ref 26.0–34.0)
MCHC: 31.9 g/dL (ref 30.0–36.0)
MCV: 88.8 fL (ref 78.0–100.0)
MONO ABS: 0.8 10*3/uL (ref 0.1–1.0)
MPV: 9 fL (ref 8.6–12.4)
Monocytes Relative: 10 % (ref 3–12)
Neutro Abs: 4.9 10*3/uL (ref 1.7–7.7)
Neutrophils Relative %: 64 % (ref 43–77)
Platelets: 220 10*3/uL (ref 150–400)
RBC: 3.74 MIL/uL — ABNORMAL LOW (ref 4.22–5.81)
RDW: 14.3 % (ref 11.5–15.5)
WBC: 7.7 10*3/uL (ref 4.0–10.5)

## 2015-12-05 LAB — HEPATIC FUNCTION PANEL
ALBUMIN: 4.1 g/dL (ref 3.6–5.1)
ALK PHOS: 77 U/L (ref 40–115)
ALT: 26 U/L (ref 9–46)
AST: 23 U/L (ref 10–35)
BILIRUBIN DIRECT: 0.1 mg/dL (ref <=0.2)
BILIRUBIN INDIRECT: 0.5 mg/dL (ref 0.2–1.2)
BILIRUBIN TOTAL: 0.6 mg/dL (ref 0.2–1.2)
Total Protein: 6.5 g/dL (ref 6.1–8.1)

## 2015-12-05 LAB — TSH: TSH: 1.057 u[IU]/mL (ref 0.350–4.500)

## 2015-12-05 MED ORDER — AMLODIPINE BESYLATE 5 MG PO TABS: 5.0000 mg | ORAL_TABLET | Freq: Every day | ORAL | Status: DC

## 2015-12-05 MED ORDER — CLOPIDOGREL BISULFATE 75 MG PO TABS: ORAL_TABLET | ORAL | Status: DC

## 2015-12-05 NOTE — Patient Instructions (Signed)
Medication Instructions:  1. START NORVASC 5 MG DAILY; RX SENT TO [facility] IN [place], [place]  Labwork: 1. TODAY BMET, CBC W/DIFF, TSH, BNP, LFT  Testing/Procedures: Your physician has requested that you have en exercise stress myoview. pT WOULD LIKE A Monday OR Friday  For further information please visit [email or web address]. Please follow instruction sheet, as given.   Follow-Up: SCOTT WEAVER, PAC 2-3 WEEKS SAME DAY DR. Angelena Form IS IN THE OFFICE  Any Other Special Instructions Will Be Listed Below (If Applicable).     If you need a refill on your cardiac medications before your next appointment, please call your pharmacy.

## 2015-12-05 NOTE — Telephone Encounter (Signed)
RX FOR PLAVIX SENT TO [facility] PER PT REQUEST; RX FOR NORVASC SENT TO [place], [place]

## 2015-12-06 ENCOUNTER — Telehealth: Payer: Self-pay | Admitting: *Deleted

## 2015-12-06 DIAGNOSIS — I25119 Atherosclerotic heart disease of native coronary artery with unspecified angina pectoris: Secondary | ICD-10-CM

## 2015-12-06 LAB — BRAIN NATRIURETIC PEPTIDE: Brain Natriuretic Peptide: 532.2 pg/mL — ABNORMAL HIGH (ref 0.0–100.0)

## 2015-12-06 MED ORDER — FUROSEMIDE 20 MG PO TABS: 20.0000 mg | ORAL_TABLET | ORAL | Status: DC

## 2015-12-06 NOTE — Telephone Encounter (Signed)
Pt notified of lab results and findings by phone w/verbal understanding to instructions. Start Lasix 20 mg daily x 1 week then PRN for wt gain 3 lb's, increased edema. Pt aware needs echo per PA. Will try to arrange echo same day as myoview.

## 2015-12-10 ENCOUNTER — Telehealth (HOSPITAL_COMMUNITY): Payer: Self-pay | Admitting: *Deleted

## 2015-12-10 NOTE — Telephone Encounter (Signed)
Patient given detailed instructions per Myocardial Perfusion Study Information Sheet for the test on 12/13/15 at 0715. Patient notified to arrive 15 minutes early and that it is imperative to arrive on time for appointment to keep from having the test rescheduled.  If you need to cancel or reschedule your appointment, please call the office within 24 hours of your appointment. Failure to do so may result in a cancellation of your appointment, and a $50 no show fee. Patient verbalized understanding.Marcie Shearon, [place]e [place]

## 2015-12-13 ENCOUNTER — Ambulatory Visit (HOSPITAL_COMMUNITY): Payer: BLUE CROSS/BLUE SHIELD | Attending: Internal Medicine

## 2015-12-13 ENCOUNTER — Other Ambulatory Visit (INDEPENDENT_AMBULATORY_CARE_PROVIDER_SITE_OTHER): Payer: BLUE CROSS/BLUE SHIELD | Admitting: *Deleted

## 2015-12-13 DIAGNOSIS — R0602 Shortness of breath: Secondary | ICD-10-CM

## 2015-12-13 DIAGNOSIS — I25119 Atherosclerotic heart disease of native coronary artery with unspecified angina pectoris: Secondary | ICD-10-CM

## 2015-12-13 DIAGNOSIS — R9439 Abnormal result of other cardiovascular function study: Secondary | ICD-10-CM | POA: Diagnosis not present

## 2015-12-13 DIAGNOSIS — I480 Paroxysmal atrial fibrillation: Secondary | ICD-10-CM

## 2015-12-13 DIAGNOSIS — I10 Essential (primary) hypertension: Secondary | ICD-10-CM | POA: Diagnosis not present

## 2015-12-13 LAB — BASIC METABOLIC PANEL
BUN: 18 mg/dL (ref 7–25)
CALCIUM: 9.3 mg/dL (ref 8.6–10.3)
CHLORIDE: 103 mmol/L (ref 98–110)
CO2: 26 mmol/L (ref 20–31)
CREATININE: 1.3 mg/dL — AB (ref 0.70–1.18)
Glucose, Bld: 140 mg/dL — ABNORMAL HIGH (ref 65–99)
Potassium: 4.3 mmol/L (ref 3.5–5.3)
Sodium: 137 mmol/L (ref 135–146)

## 2015-12-13 LAB — MYOCARDIAL PERFUSION IMAGING
CHL CUP NUCLEAR SDS: 0
CHL CUP RESTING HR STRESS: 64 {beats}/min
CSEPPHR: 109 {beats}/min
LHR: 0.28
LVDIAVOL: 127 mL
LVSYSVOL: 77 mL
SRS: 10
SSS: 10
TID: 1.05

## 2015-12-13 MED ORDER — REGADENOSON 0.4 MG/5ML IV SOLN
0.4000 mg | Freq: Once | INTRAVENOUS | Status: AC
  Administered 2015-12-13: 0.4 mg via INTRAVENOUS

## 2015-12-13 MED ORDER — TECHNETIUM TC 99M SESTAMIBI GENERIC - CARDIOLITE
32.5000 | Freq: Once | INTRAVENOUS | Status: AC | PRN
  Administered 2015-12-13: 33 via INTRAVENOUS

## 2015-12-13 MED ORDER — TECHNETIUM TC 99M SESTAMIBI GENERIC - CARDIOLITE
10.2000 | Freq: Once | INTRAVENOUS | Status: AC | PRN
  Administered 2015-12-13: 10 via INTRAVENOUS

## 2015-12-13 NOTE — Addendum Note (Signed)
Addended by: Eulis Foster on: 12/13/2015 07:33 AM   Modules accepted: Orders

## 2015-12-13 NOTE — Addendum Note (Signed)
Addended by: Eulis Foster on: 12/13/2015 07:39 AM   Modules accepted: Orders

## 2015-12-16 ENCOUNTER — Telehealth: Payer: Self-pay | Admitting: *Deleted

## 2015-12-16 NOTE — Telephone Encounter (Signed)
Pt has been advised of lab results by phone with verbal understanding.

## 2015-12-17 ENCOUNTER — Telehealth: Payer: Self-pay | Admitting: *Deleted

## 2015-12-17 NOTE — Telephone Encounter (Signed)
Pt has been notified of myoview results by phone with verbal understanding.  

## 2015-12-22 NOTE — Progress Notes (Signed)
Cardiology Office Note:    Date:  12/23/2015   ID:  Gregory Bates, DOB [birth year], MRN [id number]  PCP:  Gregory Riches, NP  Cardiologist:  Dr. Lauree Chandler   Electrophysiologist:  n/a  Chief Complaint  Patient presents with  . Congestive Heart Failure    Follow up     History of Present Illness:     Gregory Bates is a 75 y.o. male with a hx of CAD s/p inferior STEMI in 12/13 treated with a DES to the RCA, non-STEMI in 4/14 treated with a DES to the OM1, and unstable angina in 2/15 treated with a DES to the distal RCA. Other history includes DM2, HTN, HL, paroxysmal atrial fibrillation/flutter. He is on a apixaban for anticoagulation. He had several episodes of AF with RVR in 2016. He was ultimately placed on amiodarone.  I saw him on 12/05/15 with complaints of dyspnea with exertion with associated chest discomfort. The patient had symptoms similar to what he experienced prior to his catheterization in 2016. Medical therapy was continued at that time. I adjusted his blood pressure medications due to uncontrolled BP. BNP returned abnormal and I placed him on low-dose Lasix. Myoview demonstrated inferior scar. Study was read as intermediate risk secondary to low EF. Echocardiogram is pending. I reviewed his stress test with Dr. Angelena Form. Continued medical therapy was recommended. He returns for follow-up.  He is doing better. Breathing is improved. Denies recurrent chest discomfort. Denies orthopnea, PND or edema. Denies syncope, dizziness. Denies any bleeding issues.  Past Medical History  Diagnosis Date  . CAD (coronary artery disease), native coronary artery     a. s/p cath May 2011 >> DES distal RCA;  b. 01/2013 NSTEMI >> OM1 99p (2.75x12 Promus Premier DES);  c. LHC (2/15): PCI: Promus DES to dist RCA ;  d. LHC 1/16 - dLM 53, oLAD 30, small D1 tandem 50, pOM1 stent ok, mOM 50-60, dOM bifurcation 50-60 in both vessels, AV 32-70 jailed by stent, pRCA 13 mRCA stent ok, dRCA stent  ok, PLB smal 50, dPDA 60, 80; EF 35-40 >> med Rx - PCI of PDA if angina  . Hypertension   . Hyperlipidemia   . Diabetes mellitus     AODM  . Anemia, unspecified   . Colorectal polyps 2002  . Erectile dysfunction   . History of echocardiogram     a. 01/2013 Echo: EF 55%, mid-dist inflat HK, Gr1 DD, Triv AI/TR, Mild MR.;  b.  Echo (1/15): Mild LVH, EF 55%, inferolateral hypokinesis, grade 1 diastolic dysfunction, mild AI, trivial MR, moderate LAE, PASP 36 mmHg   . PAF (paroxysmal atrial fibrillation) ([place])     a. Dx 11/2013. Spont conv. Placed on eliquis.  . Acute renal insufficiency     a. During 11/2013 adm with atrial flutter.  . Myocardial infarction ([place])   . Hx of cardiovascular stress test     Stress myoview 08/07/13 with LVEF 44%, inferior scar, no ischemia  . Ischemic cardiomyopathy     a. EF 35-40% by LHC in 1/16    Past Surgical History  Procedure Laterality Date  . Wisdom tooth extraction    . Colonoscopy w/ polypectomy  2002    [facility]  . Infantile paralysis facial asymmetry    . Coronary stent placement  2007, 2011  . Colonoscopy w/ polypectomy  2004; 2007 negative  . Coronary angioplasty  12/26/2013  . Left heart catheterization with coronary angiogram N/A 10/02/2012    Procedure:  LEFT HEART CATHETERIZATION WITH CORONARY ANGIOGRAM;  Surgeon: Peter M Martinique, MD;  Location: [facility] CATH LAB;  Service: Cardiovascular;  Laterality: N/A;  . Percutaneous coronary stent intervention (pci-s)  10/02/2012    Procedure: PERCUTANEOUS CORONARY STENT INTERVENTION (PCI-S);  Surgeon: Peter M Martinique, MD;  Location: [facility] CATH LAB;  Service: Cardiovascular;;  . Left heart catheterization with coronary angiogram N/A 02/20/2013    Procedure: LEFT HEART CATHETERIZATION WITH CORONARY ANGIOGRAM;  Surgeon: Burnell Blanks, MD;  Location: [facility] CATH LAB;  Service: Cardiovascular;  Laterality: N/A;  . Percutaneous coronary stent intervention (pci-s)  02/20/2013    Procedure: PERCUTANEOUS CORONARY  STENT INTERVENTION (PCI-S);  Surgeon: Burnell Blanks, MD;  Location: [facility] CATH LAB;  Service: Cardiovascular;;  . Left heart catheterization with coronary angiogram N/A 12/26/2013    Procedure: LEFT HEART CATHETERIZATION WITH CORONARY ANGIOGRAM;  Surgeon: Burnell Blanks, MD;  Location: [facility] CATH LAB;  Service: Cardiovascular;  Laterality: N/A;  . Percutaneous coronary stent intervention (pci-s)  12/26/2013    Procedure: PERCUTANEOUS CORONARY STENT INTERVENTION (PCI-S);  Surgeon: Burnell Blanks, MD;  Location: [facility] CATH LAB;  Service: Cardiovascular;;  . Left heart catheterization with coronary angiogram N/A 11/28/2014    Procedure: LEFT HEART CATHETERIZATION WITH CORONARY ANGIOGRAM;  Surgeon: Burnell Blanks, MD;  Location: [facility] CATH LAB;  Service: Cardiovascular;  Laterality: N/A;    Current Medications: Outpatient Prescriptions Prior to Visit  Medication Sig Dispense Refill  . amiodarone (PACERONE) 200 MG tablet TAKE 1 TABLET EVERY DAY 90 tablet 1  . atorvastatin (LIPITOR) 20 MG tablet TAKE 1 TABLET EVERY DAY  (STOP  SIMVASTATIN) 90 tablet 1  . cholecalciferol (VITAMIN D) 1000 UNITS tablet Take 1,000 Units by mouth daily.    . clopidogrel (PLAVIX) 75 MG tablet TAKE 1 TABLET EVERY DAY WITH BREAKFAST 90 tablet 3  . ELIQUIS 5 MG TABS tablet TAKE ONE TABLET BY MOUTH TWICE DAILY 60 tablet 6  . fexofenadine (ALLEGRA) 180 MG tablet Take 180 mg by mouth daily.      . isosorbide mononitrate (IMDUR) 30 MG 24 hr tablet TAKE 1 TABLET EVERY DAY 90 tablet 1  . lisinopril (PRINIVIL,ZESTRIL) 20 MG tablet TAKE 1 TABLET EVERY DAY 90 tablet 3  . meclizine (ANTIVERT) 12.5 MG tablet Take 1 tablet (12.5 mg total) by mouth 3 (three) times daily as needed for dizziness. 30 tablet 0  . metoprolol (LOPRESSOR) 50 MG tablet TAKE 1 TABLET TWICE DAILY 180 tablet 3  . Multiple Vitamin (MULTIVITAMIN) tablet Take 1 tablet by mouth daily.      . nitroGLYCERIN (NITROSTAT) 0.4 MG SL tablet Place 0.4 mg  under the tongue every 5 (five) minutes as needed for chest pain.    . potassium chloride SA (K-DUR,KLOR-CON) 20 MEQ tablet Take 20 mEq by mouth daily as needed (FOR LEG CRAMPS).    . vitamin E 100 UNIT capsule Take 100 Units by mouth daily.    [name] Kitchen amLODipine (NORVASC) 5 MG tablet Take 1 tablet (5 mg total) by mouth daily. 90 tablet 3  . furosemide (LASIX) 20 MG tablet Take 1 tablet (20 mg total) by mouth as directed. 20 mg daily for 1 week; then as needed if weight up 3 lb's in 1 day or increased swelling 30 tablet 2   No facility-administered medications prior to visit.     Allergies:   Brilinta; Crestor; and Lipitor   Social History   Social History  . Marital Status: Married    Spouse Name: N/A  . Number  of Children: 1  . Years of Education: N/A   Occupational History  . maintenance     full time   Social History Main Topics  . Smoking status: Former Smoker    Quit date: 11/02/1984  . Smokeless tobacco: Never Used     Comment: smoked [place], up to 3 ppd (!)  . Alcohol Use: No  . Drug Use: No  . Sexual Activity: Not Asked   Other Topics Concern  . None   Social History Narrative     Family History:  The patient's family history includes Colon cancer in his brother; Heart attack (age of onset: 25) in his brother. There is no history of Diabetes, Stroke, or Hypertension.   ROS:   Please see the history of present illness.    Review of Systems  Respiratory: Positive for shortness of breath.   All other systems reviewed and are negative.   Physical Exam:    VS:  BP 118/42 mmHg  Pulse 48  Ht '5\' 11"'$  (1.803 m)  Wt 187 lb 6.4 oz (85.004 kg)  BMI 26.15 kg/m2   GEN: Well nourished, well developed, in no acute distress HEENT: normal Neck: no JVD, no masses Cardiac: Normal S1/S2, RRR; no murmurs, no edema   Respiratory:  clear to auscultation bilaterally; no wheezing, rhonchi or rales GI: soft, nontender  MS: no deformity or atrophy Skin: warm and dry  Neuro:     no focal deficits  Psych: Alert and oriented x 3, normal affect  Wt Readings from Last 3 Encounters:  12/23/15 187 lb 6.4 oz (85.004 kg)  12/05/15 192 lb 1.9 oz (87.145 kg)  09/23/15 186 lb 9.6 oz (84.641 kg)      Studies/Labs Reviewed:     EKG:  EKG is not  ordered today.  The ekg ordered today n/a   Recent Labs: 12/05/2015: ALT 26; Hemoglobin 10.6*; Platelets 220; TSH 1.057 12/13/2015: BUN 18; Creat 1.30*; Potassium 4.3; Sodium 137   Recent Lipid Panel    Component Value Date/Time   CHOL 170 11/04/2013 0617   TRIG 166* 11/04/2013 0617   HDL 34* 11/04/2013 0617   CHOLHDL 5.0 11/04/2013 0617   VLDL 33 11/04/2013 0617   LDLCALC 103* 11/04/2013 0617   LDLDIRECT 69.3 11/26/2008 1256    Additional studies/ records that were reviewed today include:   Myoview 2/17 EF 39%, inferior scar, no ischemia, intermediate risk  LHC 11/28/14 Left main: Distal 20% stenosis.  Left Anterior Descending Artery:  Ostial 30%, small to moderate D1 with tandem 70%  Circumflex Artery: OM1 proximal stent patent, mid OM 50-60%, distal OM bifurcation complex 50-60% involving both vessels at branch point, AV groove 60-70%-jailed by OM stent  Right Coronary Artery: Proximal 40%, mid stent patent, distal stent patent, PLB small-caliber 50%, PDA small to moderate distal 60%, 80%  Left Ventricular Angiogram: LVEF 35-40% with inferior wall hypokinesis.  Impression: 1. Triple vessel CAD 2. Patent stents in the RCA and OM 3. Moderate disease in the PDA and the OM1 4. Moderate LV systolic dysfunction Recommendations: Will continue medical management of CAD. No focal targets for PCI. His PDA does have moderately severe disease but intervention of this vessel would be very difficult. The RCA is heavily calcified and tortuous with multiple stents in the mid and distal vessel. Delivering a stent to the PDA would be difficult. Since this area has not changed over the last year, PCI of the PDA will not be attempted  today. I will increase  his Imdur to 15 mg po BID as he reports feeling great in the am but chest pain in the afternoon. If his angina cannot be controlled with medical therapy, will have to consider PCI of the PDA and possibly the small OM branch.   Echo (1/15):  Mild LVH, EF 55%, inferolateral hypokinesis, grade 1 diastolic dysfunction, mild AI, trivial MR, moderate LAE, PASP 36 mmHg   LHC (2/15):  dLM 20, oLAD 30, D1 with tandem 70, pOM1 stent ok w/ mid 50, both vessels dOM bifurc 50, AVCFX 60-70, pRCA 40, mRCA stent ok, dRCA 99 on prox edge of old stent, PL Br 50, PDA Br 60 followed by 80, EF 40% with inf HK >> PCI: Promus DES to dist RCA  Stress myoview 08/07/13 LVEF 44%, inferior scar, no ischemia   ASSESSMENT:     1. Chronic systolic CHF (congestive heart failure) ([place])   2. Coronary artery disease involving native coronary artery of native heart with angina pectoris ([place])   3. Ischemic cardiomyopathy   4. PAF (paroxysmal atrial fibrillation) ([place])   5. Essential hypertension   6. Hyperlipidemia     PLAN:     In order of problems listed above:  1. Chronic systolic CHF - Volume improved. His weight is down 5 pounds. He is NYHA 2. Continue current therapy.  2. CAD -  He denies further angina. Cardiac catheterization in 1/16 did demonstrate stable anatomy. There was some disease in the distal PDA as well as OM branch that could be approached with PCI if angina was not controlled with medical therapy.  Recent Myoview with inferior scar but no ischemia. EF was 39% and repeat echocardiogram is pending.  Continue Plavix, beta blocker, statin, nitrates, amlodipine.  3. Ischemic cardiomyopathy - EF was 35-40% at cardiac cath is a last year. Repeat echo pending. Continue beta blocker, ACE inhibitor, nitrates.   4. PAF - Maintaining NSR. Continue amiodarone, beta blocker, Eliquis. CHADS2-VASc=5.  Recent TSH, LFTs, hemoglobin, creatinine stable.  5. HTN - Blood pressure controlled.  Continue isosorbide, lisinopril, metoprolol, amlodipine.  6. HL - Continue statin.    Medication Adjustments/Labs and Tests Ordered: Current medicines are reviewed at length with the patient today.  Concerns regarding medicines are outlined above.  Medication changes, Labs and Tests ordered today are outlined in the Patient Instructions noted below. Patient Instructions  Medication Instructions:  A REFILL WAS SENT IN FOR LASIX AND AMLODIPINE TO [facility]  Labwork: NONE  Testing/Procedures: NONE  Follow-Up: 04/07/16 @ 8:15 WITH DR. Angelena Form  Any Other Special Instructions Will Be Listed Below (If Applicable).  If you need a refill on your cardiac medications before your next appointment, please call your pharmacy.    Signed, Richardson Dopp, PA-C  12/23/2015 8:45 AM    [facility] [address], [place], [place]  [address] Phone: [phone]; Fax: [phone]

## 2015-12-23 ENCOUNTER — Other Ambulatory Visit: Payer: Self-pay

## 2015-12-23 ENCOUNTER — Encounter: Payer: Self-pay | Admitting: Physician Assistant

## 2015-12-23 ENCOUNTER — Ambulatory Visit (HOSPITAL_COMMUNITY): Payer: BLUE CROSS/BLUE SHIELD | Attending: Cardiovascular Disease

## 2015-12-23 ENCOUNTER — Ambulatory Visit (INDEPENDENT_AMBULATORY_CARE_PROVIDER_SITE_OTHER): Payer: BLUE CROSS/BLUE SHIELD | Admitting: Physician Assistant

## 2015-12-23 VITALS — BP 118/42 | HR 48 | Ht 71.0 in | Wt 187.4 lb

## 2015-12-23 DIAGNOSIS — I25119 Atherosclerotic heart disease of native coronary artery with unspecified angina pectoris: Secondary | ICD-10-CM

## 2015-12-23 DIAGNOSIS — I340 Nonrheumatic mitral (valve) insufficiency: Secondary | ICD-10-CM | POA: Insufficient documentation

## 2015-12-23 DIAGNOSIS — E119 Type 2 diabetes mellitus without complications: Secondary | ICD-10-CM | POA: Diagnosis not present

## 2015-12-23 DIAGNOSIS — I5022 Chronic systolic (congestive) heart failure: Secondary | ICD-10-CM

## 2015-12-23 DIAGNOSIS — I480 Paroxysmal atrial fibrillation: Secondary | ICD-10-CM

## 2015-12-23 DIAGNOSIS — I351 Nonrheumatic aortic (valve) insufficiency: Secondary | ICD-10-CM | POA: Insufficient documentation

## 2015-12-23 DIAGNOSIS — I2510 Atherosclerotic heart disease of native coronary artery without angina pectoris: Secondary | ICD-10-CM | POA: Diagnosis present

## 2015-12-23 DIAGNOSIS — I119 Hypertensive heart disease without heart failure: Secondary | ICD-10-CM | POA: Diagnosis not present

## 2015-12-23 DIAGNOSIS — E785 Hyperlipidemia, unspecified: Secondary | ICD-10-CM | POA: Insufficient documentation

## 2015-12-23 DIAGNOSIS — I10 Essential (primary) hypertension: Secondary | ICD-10-CM

## 2015-12-23 DIAGNOSIS — I252 Old myocardial infarction: Secondary | ICD-10-CM | POA: Insufficient documentation

## 2015-12-23 DIAGNOSIS — R29898 Other symptoms and signs involving the musculoskeletal system: Secondary | ICD-10-CM | POA: Insufficient documentation

## 2015-12-23 DIAGNOSIS — I255 Ischemic cardiomyopathy: Secondary | ICD-10-CM

## 2015-12-23 DIAGNOSIS — I5042 Chronic combined systolic (congestive) and diastolic (congestive) heart failure: Secondary | ICD-10-CM

## 2015-12-23 DIAGNOSIS — I5032 Chronic diastolic (congestive) heart failure: Secondary | ICD-10-CM | POA: Insufficient documentation

## 2015-12-23 HISTORY — DX: Chronic combined systolic (congestive) and diastolic (congestive) heart failure: I50.42

## 2015-12-23 MED ORDER — AMLODIPINE BESYLATE 5 MG PO TABS: 5.0000 mg | ORAL_TABLET | Freq: Every day | ORAL | Status: DC

## 2015-12-23 MED ORDER — FUROSEMIDE 20 MG PO TABS: 20.0000 mg | ORAL_TABLET | Freq: Every day | ORAL | Status: DC

## 2015-12-23 NOTE — Patient Instructions (Addendum)
Medication Instructions:  A REFILL WAS SENT IN FOR LASIX AND AMLODIPINE TO [facility]  Labwork: NONE  Testing/Procedures: NONE  Follow-Up: 04/07/16 @ 8:15 WITH DR. Angelena Form  Any Other Special Instructions Will Be Listed Below (If Applicable).  If you need a refill on your cardiac medications before your next appointment, please call your pharmacy.

## 2015-12-24 ENCOUNTER — Telehealth: Payer: Self-pay | Admitting: *Deleted

## 2015-12-24 NOTE — Telephone Encounter (Signed)
Pt has been notified of echo results by phone with verbal understanding 

## 2016-03-06 ENCOUNTER — Other Ambulatory Visit: Payer: Self-pay | Admitting: *Deleted

## 2016-03-06 ENCOUNTER — Telehealth: Payer: Self-pay | Admitting: *Deleted

## 2016-03-06 MED ORDER — POTASSIUM CHLORIDE CRYS ER 20 MEQ PO TBCR
20.0000 meq | EXTENDED_RELEASE_TABLET | Freq: Every day | ORAL | Status: DC | PRN
Start: 2016-03-06 — End: 2016-10-06

## 2016-03-06 NOTE — Telephone Encounter (Signed)
Patient request an rx for potassium to [facility]. He stated that Dr Angelena Form prescribed this for him years ago to take prn for leg cramps. It does not look like he has had this filled in a while. Just wanted to be sure ok to refill. Patient can be reached at [phone]. Please advise. Thanks, MI

## 2016-03-06 NOTE — Telephone Encounter (Signed)
This was on his med list as daily but he reported taking as needed for leg cramps. Last potassium on 12/13/15 was OK.  OK to refill

## 2016-03-24 ENCOUNTER — Other Ambulatory Visit: Payer: Self-pay | Admitting: Cardiovascular Disease

## 2016-03-26 ENCOUNTER — Other Ambulatory Visit: Payer: Self-pay | Admitting: Cardiovascular Disease

## 2016-04-06 NOTE — Progress Notes (Signed)
Chief Complaint  Patient presents with  . Shortness of Breath     History of Present Illness: 75 yo WM with history of DM, HTN, hyperlipidemia, atrial flutter and CAD here today for cardiac follow up. He has multiple admissions for CAD and atrial flutter. Admitted to [facility] 10/02/12 with inferior STEMI and found to have occluded RCA treated with DES. Readmitted 02/17/13 with NSTEMI. DES placed in severe first OM stenosis. Noted to have diffuse moderate disease in all vessels. Stress myoview 08/07/13 with LVEF-=44%, inferior scar, no ischemia. Admitted to [facility] 11/03/12 with SOB and chest pain. He was found to be in atrial flutter with RVR. He converted to sinus on diltiazem drip. Echo 11/05/12 with normal LV size and function. He was started on Eliquis and discharged on 11/05/12. His metoprolol was increased to 50 mg po BID. I saw him February 2015 and he had c/o CP and fatigue, dyspnea. I arranged a cardiac cath on 12/26/13 and he was found to have high grade stenosis in the distal RCA. A 2.75 x 16 mm Promus Premier DES was placed in the distal RCA. He was seen in the ED January 4th and January 11th with atrial fibrillation and has had office follow up with Richardson Dopp, PA-C. His diltiazem was titrated but he developed a rash and he was changed to verapamil. He has since been managed with Lopressor and verapamil. Due to recurrent chest pain, cardiac cath was repeated on 11/28/14 and showed stable CAD. He presented back to the ED 12/03/14 with palpitations and was found to be in atrial flutter with RVR. Metoprolol was increased to 50 mg po BID. I saw him in the office 12/05/14 and he was in sinus brady. I stopped his verapamil and started amiodarone and continued his metoprolol. He was seen by Richardson Dopp, PA-C February 2017 and had c/o dyspnea and chest pain. Lasix 20 mg daily started and this helped his dyspnea. Stress myoview 2017 with no ischemia. Echo February 2017 with LVEF=45-50%, mild AI. Mild MR.  He is  here today for follow up.  Feeling well today. No chest pain. He has SOB with moderate exertion. This is unchanged. No recent palpitations. Still working. Tolerating amiodarone. Recent labs with normal TSH and LFTs. Eye exam 6 months ago. Last chest x-ray Feb 2016 with small nodule.   Primary Care Physician: Imagene Riches, NP Urology: Joie Bimler, MD   Past Medical History  Diagnosis Date  . CAD (coronary artery disease), native coronary artery     a. s/p cath May 2011 >> DES distal RCA;  b. 01/2013 NSTEMI >> OM1 99p (2.75x12 Promus Premier DES);  c. LHC (2/15): PCI: Promus DES to dist RCA ;  d. LHC 1/16 - dLM 37, oLAD 30, small D1 tandem 79, pOM1 stent ok, mOM 50-60, dOM bifurcation 50-60 in both vessels, AV 70-70 jailed by stent, pRCA 25 mRCA stent ok, dRCA stent ok, PLB smal 50, dPDA 60, 80; EF 35-40 >> med Rx - PCI of PDA if angina  . Hypertension   . Hyperlipidemia   . Diabetes mellitus     AODM  . Anemia, unspecified   . Colorectal polyps 2002  . Erectile dysfunction   . History of echocardiogram     a. 01/2013 Echo: EF 55%, mid-dist inflat HK, Gr1 DD, Triv AI/TR, Mild MR.;  b.  Echo (1/15): Mild LVH, EF 55%, inferolateral hypokinesis, grade 1 diastolic dysfunction, mild AI, trivial MR, moderate LAE, PASP 36 mmHg   .  PAF (paroxysmal atrial fibrillation) ([place])     a. Dx 11/2013. Spont conv. Placed on eliquis.  . Acute renal insufficiency     a. During 11/2013 adm with atrial flutter.  . Myocardial infarction ([place])   . Hx of cardiovascular stress test     Stress myoview 08/07/13 with LVEF 44%, inferior scar, no ischemia  . Ischemic cardiomyopathy     a. EF 35-40% by LHC in 1/16; b. Echo 2/17: Mild LVH, EF 45-50%, inferior akinesis, grade 2 diastolic dysfunction, mild AI, mild MR, mild LAE, PASP 44 mmHg  . Chronic combined systolic and diastolic CHF (congestive heart failure) ([place]) 12/23/2015    Echo 2/17: Mild LVH, EF 45-50%, inferior akinesis, grade 2 diastolic dysfunction, mild AI,  mild MR, mild LAE, PASP 44 mmHg     Past Surgical History  Procedure Laterality Date  . Wisdom tooth extraction    . Colonoscopy w/ polypectomy  2002    [facility]  . Infantile paralysis facial asymmetry    . Coronary stent placement  2007, 2011  . Colonoscopy w/ polypectomy  2004; 2007 negative  . Coronary angioplasty  12/26/2013  . Left heart catheterization with coronary angiogram N/A 10/02/2012    Procedure: LEFT HEART CATHETERIZATION WITH CORONARY ANGIOGRAM;  Surgeon: Peter M Martinique, MD;  Location: [facility] CATH LAB;  Service: Cardiovascular;  Laterality: N/A;  . Percutaneous coronary stent intervention (pci-s)  10/02/2012    Procedure: PERCUTANEOUS CORONARY STENT INTERVENTION (PCI-S);  Surgeon: Peter M Martinique, MD;  Location: [facility] CATH LAB;  Service: Cardiovascular;;  . Left heart catheterization with coronary angiogram N/A 02/20/2013    Procedure: LEFT HEART CATHETERIZATION WITH CORONARY ANGIOGRAM;  Surgeon: Burnell Blanks, MD;  Location: [facility] CATH LAB;  Service: Cardiovascular;  Laterality: N/A;  . Percutaneous coronary stent intervention (pci-s)  02/20/2013    Procedure: PERCUTANEOUS CORONARY STENT INTERVENTION (PCI-S);  Surgeon: Burnell Blanks, MD;  Location: [facility] CATH LAB;  Service: Cardiovascular;;  . Left heart catheterization with coronary angiogram N/A 12/26/2013    Procedure: LEFT HEART CATHETERIZATION WITH CORONARY ANGIOGRAM;  Surgeon: Burnell Blanks, MD;  Location: [facility] CATH LAB;  Service: Cardiovascular;  Laterality: N/A;  . Percutaneous coronary stent intervention (pci-s)  12/26/2013    Procedure: PERCUTANEOUS CORONARY STENT INTERVENTION (PCI-S);  Surgeon: Burnell Blanks, MD;  Location: [facility] CATH LAB;  Service: Cardiovascular;;  . Left heart catheterization with coronary angiogram N/A 11/28/2014    Procedure: LEFT HEART CATHETERIZATION WITH CORONARY ANGIOGRAM;  Surgeon: Burnell Blanks, MD;  Location: [facility] CATH LAB;  Service: Cardiovascular;  Laterality: N/A;      Current Outpatient Prescriptions  Medication Sig Dispense Refill  . amiodarone (PACERONE) 200 MG tablet Take 1 tablet (200 mg total) by mouth daily. 90 tablet 2  . amLODipine (NORVASC) 5 MG tablet Take 1 tablet (5 mg total) by mouth daily. 90 tablet 3  . atorvastatin (LIPITOR) 20 MG tablet Take 1 tablet (20 mg total) by mouth daily. 90 tablet 2  . cholecalciferol (VITAMIN D) 1000 UNITS tablet Take 1,000 Units by mouth daily.    . clopidogrel (PLAVIX) 75 MG tablet TAKE 1 TABLET EVERY DAY WITH BREAKFAST 90 tablet 3  . ELIQUIS 5 MG TABS tablet TAKE ONE TABLET BY MOUTH TWICE DAILY 60 tablet 6  . fexofenadine (ALLEGRA) 180 MG tablet Take 180 mg by mouth daily.      . furosemide (LASIX) 20 MG tablet Take 1 tablet (20 mg total) by mouth daily. 90 tablet 3  . isosorbide mononitrate (  IMDUR) 30 MG 24 hr tablet Take 1 tablet (30 mg total) by mouth daily. 90 tablet 2  . lisinopril (PRINIVIL,ZESTRIL) 20 MG tablet TAKE 1 TABLET EVERY DAY 90 tablet 3  . meclizine (ANTIVERT) 12.5 MG tablet Take 1 tablet (12.5 mg total) by mouth 3 (three) times daily as needed for dizziness. 30 tablet 0  . metoprolol (LOPRESSOR) 50 MG tablet TAKE 1 TABLET TWICE DAILY 180 tablet 3  . Multiple Vitamin (MULTIVITAMIN) tablet Take 1 tablet by mouth daily.      . nitroGLYCERIN (NITROSTAT) 0.4 MG SL tablet Place 0.4 mg under the tongue every 5 (five) minutes as needed for chest pain.    . potassium chloride SA (K-DUR,KLOR-CON) 20 MEQ tablet Take 1 tablet (20 mEq total) by mouth daily as needed (FOR LEG CRAMPS). 30 tablet 8  . vitamin E 100 UNIT capsule Take 100 Units by mouth daily.     No current facility-administered medications for this visit.    Allergies  Allergen Reactions  . Brilinta [Ticagrelor] Other (See Comments)    Arthralgias & myalgias  . Crestor [Rosuvastatin] Other (See Comments)    Myalgias (takes Zocor)  . Lipitor [Atorvastatin] Other (See Comments)    Myalgias (takes Zocor)    Social History    Social History  . Marital Status: Married    Spouse Name: N/A  . Number of Children: 1  . Years of Education: N/A   Occupational History  . maintenance     full time   Social History Main Topics  . Smoking status: Former Smoker    Quit date: 11/02/1984  . Smokeless tobacco: Never Used     Comment: smoked [place], up to 3 ppd (!)  . Alcohol Use: No  . Drug Use: No  . Sexual Activity: Not on file   Other Topics Concern  . Not on file   Social History Narrative    Family History  Problem Relation Age of Onset  . Heart attack Brother 58  . Colon cancer Brother   . Diabetes Neg Hx   . Stroke Neg Hx   . Hypertension Neg Hx     Review of Systems:  As stated in the HPI and otherwise negative.   BP 118/54 mmHg  Pulse 50  Ht '5\' 11"'$  (2.202 m)  Wt 185 lb (83.915 kg)  BMI 25.81 kg/m2  SpO2 98%  Physical Examination: General: Well developed, well nourished, NAD HEENT: OP clear, mucus membranes moist SKIN: warm, dry. No rashes. Neuro: No focal deficits Musculoskeletal: Muscle strength 5/5 all ext Psychiatric: Mood and affect normal Neck: No JVD, no carotid bruits, no thyromegaly, no lymphadenopathy. Lungs:Clear bilaterally, no wheezes, rhonci, crackles Cardiovascular: Regular rate and rhythm. No murmurs, gallops or rubs. Abdomen:Soft. Bowel sounds present. Non-tender.  Extremities: No lower extremity edema. Pulses are 2 + in the bilateral DP/PT.  Cardiac cath 11/28/14: Left main: Distal 20% stenosis.  Left Anterior Descending Artery: Moderate caliber vessel that does not reach the apex. The ostium has a 30% stenosis. The mid vessel has diffuse plaque. The first diagonal branch is small to moderate in caliber (2.0 mm) and has tandem 70% stenoses which are unchanged from the last cath.  Circumflex Artery: Moderate caliber vessel with moderate caliber first obtuse marginal branch. The first OM branch arises early and has a patent stent in the proximal segment with no  restenosis. The mid segment of the OM branch has a 50-60% stenosis. Distally the OM branch bifurcates and there is complex  50-60% stenosis involving both vessels beyond the branch point, non-flow limiting and unchanged from last cath. The AV groove Circumflex has 60-70% stenosis just after the takeoff of the OM branch as it is jailed by the OM stent, unchanged from last cath.  Right Coronary Artery: Large dominant vessel with severe calcification in the proximal and mid vessel. The proximal vessel has a 40% stenosis. The mid vessel has a patent stent with no restenosis. The distal vessel has a long stented segment that is patent without restenosis. The posterolateral branch is small in caliber and has a 50% stenosis. The PDA is small to moderate in caliber (2.25 mm vessel) and in the distal segment there is a 60% stenosis followed by a 80% stenosis which is unchanged from the last cath.  Left Ventricular Angiogram: LVEF 35-40% with inferior wall hypokinesis.    Echo 12/23/15: Left ventricle: The cavity size was normal. There was mild  concentric hypertrophy. Systolic function was mildly reduced. The  estimated ejection fraction was in the range of 45% to 50%.  Akinesis and scarring of the basal-midinferior myocardium;  consistent with infarction in the distribution of the right  coronary artery. Features are consistent with a pseudonormal left  ventricular filling pattern, with concomitant abnormal relaxation  and increased filling pressure (grade 2 diastolic dysfunction). - Aortic valve: There was mild regurgitation. - Mitral valve: There was mild regurgitation directed centrally. - Left atrium: The atrium was mildly dilated. - Pulmonary arteries: Systolic pressure was mildly increased. PA  peak pressure: 44 mm Hg (S).  EKG:  EKG is not ordered today. The ekg ordered today demonstrates   Recent Labs: 12/05/2015: ALT 26; Brain Natriuretic Peptide 532.2*; Hemoglobin 10.6*; Platelets 220;  TSH 1.057 12/13/2015: BUN 18; Creat 1.30*; Potassium 4.3; Sodium 137      Wt Readings from Last 3 Encounters:  04/07/16 185 lb (83.915 kg)  12/23/15 187 lb 6.4 oz (85.004 kg)  12/05/15 192 lb 1.9 oz (87.145 kg)     Other studies Reviewed: Additional studies/ records that were reviewed today include: . Review of the above records demonstrates:    Assessment and Plan:   1. CAD: Stable after cath January 2016. No chest pain worrisome for unstable angina. Continue Plavix, statin, beta blocker. No ASA with use of Eliquis.   2. HTN: BP is controlled. No changes.    3. Hyperlipidemia: He is on a statin. Lipids followed in primary care and well controlled.   4. Atrial flutter, paroxysmal: Sinus today. Continue amiodarone and metoprolol. He is on Eliquis for anti-coagulation. Recent normal TSH, eye exam and LFTs. Will arrange CT chest to better evaluate the pulmonary nodule and to exclude pulmonary disease while on amiodarone.   Current medicines are reviewed at length with the patient today.  The patient does not have concerns regarding medicines.  The following changes have been made:  no change  Labs/ tests ordered today include:  No orders of the defined types were placed in this encounter.    Disposition:   FU with me in 6 months  Signed, Lauree Chandler, MD 04/07/2016 8:01 AM    [facility] [address], [address], [place]  [id number] Phone: [phone]; Fax: [phone]

## 2016-04-07 ENCOUNTER — Ambulatory Visit (INDEPENDENT_AMBULATORY_CARE_PROVIDER_SITE_OTHER)
Admission: RE | Admit: 2016-04-07 | Discharge: 2016-04-07 | Disposition: A | Discharge status: Home / Self Care | Payer: BLUE CROSS/BLUE SHIELD | Source: Ambulatory Visit | Attending: Cardiovascular Disease | Admitting: Cardiovascular Disease

## 2016-04-07 ENCOUNTER — Ambulatory Visit (INDEPENDENT_AMBULATORY_CARE_PROVIDER_SITE_OTHER): Payer: BLUE CROSS/BLUE SHIELD | Admitting: Cardiovascular Disease

## 2016-04-07 ENCOUNTER — Encounter: Payer: Self-pay | Admitting: Cardiovascular Disease

## 2016-04-07 VITALS — BP 118/54 | HR 50 | Ht 71.0 in | Wt 185.0 lb

## 2016-04-07 DIAGNOSIS — E785 Hyperlipidemia, unspecified: Secondary | ICD-10-CM | POA: Diagnosis not present

## 2016-04-07 DIAGNOSIS — R911 Solitary pulmonary nodule: Secondary | ICD-10-CM

## 2016-04-07 DIAGNOSIS — I10 Essential (primary) hypertension: Secondary | ICD-10-CM | POA: Diagnosis not present

## 2016-04-07 DIAGNOSIS — I484 Atypical atrial flutter: Secondary | ICD-10-CM | POA: Diagnosis not present

## 2016-04-07 DIAGNOSIS — I2510 Atherosclerotic heart disease of native coronary artery without angina pectoris: Secondary | ICD-10-CM

## 2016-04-07 NOTE — Patient Instructions (Signed)
Medication Instructions:  Your physician recommends that you continue on your current medications as directed. Please refer to the Current Medication list given to you today.   Labwork: none  Testing/Procedures: Non-Cardiac CT scanning, (CAT scanning), is a noninvasive, special x-ray that produces cross-sectional images of the body using x-rays and a computer. CT scans help physicians diagnose and treat medical conditions. For some CT exams, a contrast material is used to enhance visibility in the area of the body being studied. CT scans provide greater clarity and reveal more details than regular x-ray exams.    Follow-Up: Your physician wants you to follow-up in: 6 months.  You will receive a reminder letter in the mail two months in advance. If you don't receive a letter, please call our office to schedule the follow-up appointment.   Any Other Special Instructions Will Be Listed Below (If Applicable).     If you need a refill on your cardiac medications before your next appointment, please call your pharmacy.

## 2016-04-28 ENCOUNTER — Other Ambulatory Visit: Payer: Self-pay | Admitting: Cardiovascular Disease

## 2016-07-09 ENCOUNTER — Other Ambulatory Visit: Payer: Self-pay | Admitting: Cardiovascular Disease

## 2016-10-02 ENCOUNTER — Ambulatory Visit (INDEPENDENT_AMBULATORY_CARE_PROVIDER_SITE_OTHER): Payer: BLUE CROSS/BLUE SHIELD | Admitting: Physician Assistant

## 2016-10-02 ENCOUNTER — Encounter: Payer: Self-pay | Admitting: Physician Assistant

## 2016-10-02 VITALS — BP 118/50 | HR 50 | Ht 71.0 in | Wt 190.0 lb

## 2016-10-02 DIAGNOSIS — I10 Essential (primary) hypertension: Secondary | ICD-10-CM

## 2016-10-02 DIAGNOSIS — I480 Paroxysmal atrial fibrillation: Secondary | ICD-10-CM | POA: Diagnosis not present

## 2016-10-02 DIAGNOSIS — I5022 Chronic systolic (congestive) heart failure: Secondary | ICD-10-CM

## 2016-10-02 DIAGNOSIS — I25119 Atherosclerotic heart disease of native coronary artery with unspecified angina pectoris: Secondary | ICD-10-CM

## 2016-10-02 DIAGNOSIS — E7800 Pure hypercholesterolemia, unspecified: Secondary | ICD-10-CM

## 2016-10-02 DIAGNOSIS — Z23 Encounter for immunization: Secondary | ICD-10-CM | POA: Diagnosis not present

## 2016-10-02 LAB — BASIC METABOLIC PANEL
BUN: 25 mg/dL (ref 7–25)
CO2: 26 mmol/L (ref 20–31)
CREATININE: 1.68 mg/dL — AB (ref 0.70–1.18)
Calcium: 9.3 mg/dL (ref 8.6–10.3)
Chloride: 104 mmol/L (ref 98–110)
Glucose, Bld: 124 mg/dL — ABNORMAL HIGH (ref 65–99)
Potassium: 4.7 mmol/L (ref 3.5–5.3)
Sodium: 138 mmol/L (ref 135–146)

## 2016-10-02 LAB — CBC
HCT: 35.1 % — ABNORMAL LOW (ref 38.5–50.0)
Hemoglobin: 11.4 g/dL — ABNORMAL LOW (ref 13.2–17.1)
MCH: 28.6 pg (ref 27.0–33.0)
MCHC: 32.5 g/dL (ref 32.0–36.0)
MCV: 88.2 fL (ref 80.0–100.0)
MPV: 9 fL (ref 7.5–12.5)
PLATELETS: 196 10*3/uL (ref 140–400)
RBC: 3.98 MIL/uL — ABNORMAL LOW (ref 4.20–5.80)
RDW: 13.8 % (ref 11.0–15.0)
WBC: 8.4 10*3/uL (ref 3.8–10.8)

## 2016-10-02 LAB — HEPATIC FUNCTION PANEL
ALT: 28 U/L (ref 9–46)
AST: 27 U/L (ref 10–35)
Albumin: 4.1 g/dL (ref 3.6–5.1)
Alkaline Phosphatase: 64 U/L (ref 40–115)
BILIRUBIN DIRECT: 0.1 mg/dL (ref <=0.2)
BILIRUBIN TOTAL: 0.6 mg/dL (ref 0.2–1.2)
Indirect Bilirubin: 0.5 mg/dL (ref 0.2–1.2)
Total Protein: 6.7 g/dL (ref 6.1–8.1)

## 2016-10-02 LAB — TSH: TSH: 0.75 mIU/L (ref 0.40–4.50)

## 2016-10-02 NOTE — Progress Notes (Signed)
Cardiology Office Note:    Date:  10/02/2016   ID:  THARON BOMAR, DOB [birth year], MRN [id number]  PCP:  Imagene Riches, NP  Cardiologist:  Dr. Lauree Chandler   Electrophysiologist:  n/a  Referring MD: Imagene Riches, NP   Chief Complaint  Patient presents with  . Follow-up    CAD    History of Present Illness:    Gregory Bates is a 75 y.o. male with a hx of CAD s/p inferior STEMI in 12/13 treated with a DES to the RCA, non-STEMI in 4/14 treated with a DES to the OM1, and unstable angina in 2/15 treated with a DES to the distal RCA. Other history includes DM2, HTN, HL, paroxysmal atrial fibrillation/flutter. He is on a apixaban for anticoagulation. He had several episodes of AF with RVR in 2016. He was ultimately placed on amiodarone. Myoview in 2/17 demonstrated inferior scar but no ischemia.  EF was 39 but Echo demonstrated EF 45-50%.  Med Rx was continued.  Last seen by Dr. Angelena Form 6/17.  He returns for Cardiology follow up. He is here alone today. He is doing well. He denies chest discomfort or significant dyspnea. He denies orthopnea, PND or edema. He denies syncope. He denies dizziness or lightheadedness.  Prior CV studies that were reviewed today include:    Echo 12/23/15 Mild concentric LVH, EF 45-50, inferior akinesis, grade 2 diastolic dysfunction, mild AI, mild MR, mild LAE, PASP 44  Myoview 2/17 EF 39%, inferior scar, no ischemia, intermediate risk   LHC 11/28/14 Left main: Distal 20% stenosis.  Left Anterior Descending Artery:  Ostial 30%, small to moderate D1 with tandem 70%  Circumflex Artery: OM1 proximal stent patent, mid OM 50-60%, distal OM bifurcation complex 50-60% involving both vessels at branch point, AV groove 60-70%-jailed by OM stent  Right Coronary Artery: Proximal 40%, mid stent patent, distal stent patent, PLB small-caliber 50%, PDA small to moderate distal 60%, 80%  Left Ventricular Angiogram: LVEF 35-40% with inferior wall hypokinesis.    Impression: 1. Triple vessel CAD 2. Patent stents in the RCA and OM 3. Moderate disease in the PDA and the OM1 4. Moderate LV systolic dysfunction Recommendations: Will continue medical management of CAD.    Echo (1/15):  Mild LVH, EF 55%, inferolateral hypokinesis, grade 1 diastolic dysfunction, mild AI, trivial MR, moderate LAE, PASP 36 mmHg    LHC (2/15):  dLM 20, oLAD 30, D1 with tandem 70, pOM1 stent ok w/ mid 60, both vessels dOM bifurc 50, AVCFX 60-70, pRCA 40, mRCA stent ok, dRCA 99 on prox edge of old stent, PL Br 50, PDA Br 60 followed by 80, EF 40% with inf HK >> PCI:  Promus DES to dist RCA   Stress myoview 08/07/13 LVEF 44%, inferior scar, no ischemia   Past Medical History:  Diagnosis Date  . Acute renal insufficiency    a. During 11/2013 adm with atrial flutter.  . Anemia, unspecified   . CAD (coronary artery disease), native coronary artery    a. s/p cath May 2011 >> DES distal RCA;  b. 01/2013 NSTEMI >> OM1 99p (2.75x12 Promus Premier DES);  c. LHC (2/15): PCI: Promus DES to dist RCA ;  d. LHC 1/16 - dLM 20, oLAD 30, small D1 tandem 55, pOM1 stent ok, mOM 50-60, dOM bifurcation 50-60 in both vessels, AV 60-70 jailed by stent, pRCA 69 mRCA stent ok, dRCA stent ok, PLB smal 50, dPDA 60, 80; EF 35-40 >> med Rx - PCI of  PDA if angina  . Chronic combined systolic and diastolic CHF (congestive heart failure) ([place]) 12/23/2015   Echo 2/17: Mild LVH, EF 45-50%, inferior akinesis, grade 2 diastolic dysfunction, mild AI, mild MR, mild LAE, PASP 44 mmHg   . Colorectal polyps 2002  . Diabetes mellitus    AODM  . Erectile dysfunction   . History of echocardiogram    a. 01/2013 Echo: EF 55%, mid-dist inflat HK, Gr1 DD, Triv AI/TR, Mild MR.;  b.  Echo (1/15): Mild LVH, EF 55%, inferolateral hypokinesis, grade 1 diastolic dysfunction, mild AI, trivial MR, moderate LAE, PASP 36 mmHg   . Hx of cardiovascular stress test    Stress myoview 08/07/13 with LVEF 44%, inferior scar, no  ischemia  . Hyperlipidemia   . Hypertension   . Ischemic cardiomyopathy    a. EF 35-40% by LHC in 1/16; b. Echo 2/17: Mild LVH, EF 45-50%, inferior akinesis, grade 2 diastolic dysfunction, mild AI, mild MR, mild LAE, PASP 44 mmHg  . Myocardial infarction   . PAF (paroxysmal atrial fibrillation) ([place])    a. Dx 11/2013. Spont conv. Placed on eliquis.    Past Surgical History:  Procedure Laterality Date  . COLONOSCOPY W/ POLYPECTOMY  2002   [facility]  . COLONOSCOPY W/ POLYPECTOMY  2004; 2007 negative  . CORONARY ANGIOPLASTY  12/26/2013  . CORONARY STENT PLACEMENT  2007, 2011  . infantile paralysis facial asymmetry    . LEFT HEART CATHETERIZATION WITH CORONARY ANGIOGRAM N/A 10/02/2012   Procedure: LEFT HEART CATHETERIZATION WITH CORONARY ANGIOGRAM;  Surgeon: Peter M Martinique, MD;  Location: [facility] CATH LAB;  Service: Cardiovascular;  Laterality: N/A;  . LEFT HEART CATHETERIZATION WITH CORONARY ANGIOGRAM N/A 02/20/2013   Procedure: LEFT HEART CATHETERIZATION WITH CORONARY ANGIOGRAM;  Surgeon: Burnell Blanks, MD;  Location: [facility] CATH LAB;  Service: Cardiovascular;  Laterality: N/A;  . LEFT HEART CATHETERIZATION WITH CORONARY ANGIOGRAM N/A 12/26/2013   Procedure: LEFT HEART CATHETERIZATION WITH CORONARY ANGIOGRAM;  Surgeon: Burnell Blanks, MD;  Location: [facility] CATH LAB;  Service: Cardiovascular;  Laterality: N/A;  . LEFT HEART CATHETERIZATION WITH CORONARY ANGIOGRAM N/A 11/28/2014   Procedure: LEFT HEART CATHETERIZATION WITH CORONARY ANGIOGRAM;  Surgeon: Burnell Blanks, MD;  Location: [facility] CATH LAB;  Service: Cardiovascular;  Laterality: N/A;  . PERCUTANEOUS CORONARY STENT INTERVENTION (PCI-S)  10/02/2012   Procedure: PERCUTANEOUS CORONARY STENT INTERVENTION (PCI-S);  Surgeon: Peter M Martinique, MD;  Location: [facility] CATH LAB;  Service: Cardiovascular;;  . PERCUTANEOUS CORONARY STENT INTERVENTION (PCI-S)  02/20/2013   Procedure: PERCUTANEOUS CORONARY STENT INTERVENTION (PCI-S);  Surgeon:  Burnell Blanks, MD;  Location: [facility] CATH LAB;  Service: Cardiovascular;;  . PERCUTANEOUS CORONARY STENT INTERVENTION (PCI-S)  12/26/2013   Procedure: PERCUTANEOUS CORONARY STENT INTERVENTION (PCI-S);  Surgeon: Burnell Blanks, MD;  Location: [facility] CATH LAB;  Service: Cardiovascular;;  . WISDOM TOOTH EXTRACTION      Current Medications: Current Meds  Medication Sig  . amiodarone (PACERONE) 200 MG tablet Take 1 tablet (200 mg total) by mouth daily.  [name] Kitchen amLODipine (NORVASC) 5 MG tablet Take 1 tablet (5 mg total) by mouth daily.  [name] Kitchen atorvastatin (LIPITOR) 20 MG tablet Take 1 tablet (20 mg total) by mouth daily.  . cholecalciferol (VITAMIN D) 1000 UNITS tablet Take 1,000 Units by mouth daily.  . clopidogrel (PLAVIX) 75 MG tablet TAKE 1 TABLET EVERY DAY WITH BREAKFAST  . ELIQUIS 5 MG TABS tablet TAKE ONE TABLET BY MOUTH TWICE DAILY  . fexofenadine (ALLEGRA) 180 MG tablet Take 180 mg  by mouth daily.    . furosemide (LASIX) 20 MG tablet Take 1 tablet (20 mg total) by mouth daily.  . isosorbide mononitrate (IMDUR) 30 MG 24 hr tablet Take 1 tablet (30 mg total) by mouth daily.  [name] Kitchen lisinopril (PRINIVIL,ZESTRIL) 20 MG tablet Take 1 tablet (20 mg total) by mouth daily.  . meclizine (ANTIVERT) 12.5 MG tablet Take 1 tablet (12.5 mg total) by mouth 3 (three) times daily as needed for dizziness.  . metoprolol (LOPRESSOR) 50 MG tablet TAKE 1 TABLET TWICE DAILY  . Multiple Vitamin (MULTIVITAMIN) tablet Take 1 tablet by mouth daily.    . nitroGLYCERIN (NITROSTAT) 0.4 MG SL tablet Place 0.4 mg under the tongue every 5 (five) minutes as needed for chest pain.  . potassium chloride SA (K-DUR,KLOR-CON) 20 MEQ tablet Take 1 tablet (20 mEq total) by mouth daily as needed (FOR LEG CRAMPS).  . vitamin E 100 UNIT capsule Take 100 Units by mouth daily.     Allergies:   Brilinta [ticagrelor]; Crestor [rosuvastatin]; and Lipitor [atorvastatin]   Social History   Social History  . Marital status: Married     Spouse name: N/A  . Number of children: 1  . Years of education: N/A   Occupational History  . maintenance     full time   Social History Main Topics  . Smoking status: Former Smoker    Quit date: 11/02/1984  . Smokeless tobacco: Never Used     Comment: smoked [place], up to 3 ppd (!)  . Alcohol use No  . Drug use: No  . Sexual activity: Not Asked   Other Topics Concern  . None   Social History Narrative  . None     Family History:  The patient's family history includes Colon cancer in his brother; Heart attack (age of onset: 42) in his brother.   ROS:   Please see the history of present illness.    Review of Systems  Cardiovascular: Positive for dyspnea on exertion.   All other systems reviewed and are negative.   EKGs/Labs/Other Test Reviewed:    EKG:  EKG is  ordered today.  The ekg ordered today demonstrates Sinus brady, HR 50, normal axis, QTc 430 ms, no change from prior tracing  Recent Labs: 12/05/2015: ALT 26; Brain Natriuretic Peptide 532.2; Hemoglobin 10.6; Platelets 220; TSH 1.057 12/13/2015: BUN 18; Creat 1.30; Potassium 4.3; Sodium 137   Recent Lipid Panel    Component Value Date/Time   CHOL 170 11/04/2013 0617   TRIG 166 (H) 11/04/2013 0617   HDL 34 (L) 11/04/2013 0617   CHOLHDL 5.0 11/04/2013 0617   VLDL 33 11/04/2013 0617   LDLCALC 103 (H) 11/04/2013 0617   LDLDIRECT 69.3 11/26/2008 1256    Physical Exam:    VS:  BP (!) 118/50   Pulse (!) 50   Ht '5\' 11"'$  (1.803 m)   Wt 190 lb (86.2 kg)   BMI 26.50 kg/m     Wt Readings from Last 3 Encounters:  10/02/16 190 lb (86.2 kg)  04/07/16 185 lb (83.9 kg)  12/23/15 187 lb 6.4 oz (85 kg)     Physical Exam  Constitutional: He is oriented to person, place, and time. He appears well-developed and well-nourished. No distress.  HENT:  Head: Normocephalic and atraumatic.  Eyes: No scleral icterus.  Neck: No JVD present.  Cardiovascular: Regular rhythm, S1 normal and S2 normal.  Bradycardia present.    No murmur heard. Pulmonary/Chest: He has decreased breath sounds. He  has no wheezes. He has no rhonchi. He has no rales.  Abdominal: There is no tenderness.  Musculoskeletal: He exhibits no edema.  Neurological: He is alert and oriented to person, place, and time.  Skin: Skin is warm and dry.  Psychiatric: He has a normal mood and affect.    ASSESSMENT:    1. Chronic systolic congestive heart failure ([place])   2. Coronary artery disease involving native coronary artery of native heart with angina pectoris (HCC)   3. Paroxysmal atrial fibrillation ([place])   4. Essential hypertension   5. Pure hypercholesterolemia   6. Encounter for immunization    PLAN:    In order of problems listed above:  1. Chronic systolic CHF - Ischemic cardiomyopathy.  NYHA 2. EF >40%. Volume is stable. Continue current regimen which includes nitrates, furosemide, metoprolol, lisinopril.    2. CAD -  Hx of inferior STEMI with DES to RCA in 2013, NSTEMI in 2014 with DES to OM1.  Last PCI in 2/15 with DES to RCA.  Cardiac catheterization in 1/16 did demonstrate stable anatomy. There was some disease in the distal PDA as well as OM branch that could be approached with PCI if angina was not controlled with medical therapy.  Myoview in 2/17 was neg for ischemia.  He denies recurrent angina. Continue current regimen which includes Plavix, statin, nitrates, beta blocker, amlodipine.  3. PAF - He is maintaining normal sinus rhythm.  CHADS2-VASc=5.  He is bradycardic but asymptomatic.  -  Continue amiodarone, beta blocker, Eliquis.   -  Check TSH, LFTs, BMET, CBC today   4. HTN -  His blood pressure is controlled.  5. HL - Continue statin.  Will need to arrange fasting Lipids at next office visit.   6. Health Maint - Influenza vaccine given today.    Medication Adjustments/Labs and Tests Ordered: Current medicines are reviewed at length with the patient today.  Concerns regarding medicines are outlined above.   Medication changes, Labs and Tests ordered today are outlined in the Patient Instructions noted below. Patient Instructions  Medication Instructions:  Your physician recommends that you continue on your current medications as directed. Please refer to the Current Medication list given to you today.  Labwork: BMET, LFT, CBC, TSH  Testing/Procedures: NONE  Follow-Up: Your physician wants you to follow-up in: [place] DR. Angelena Form You will receive a reminder letter in the mail two months in advance. If you don't receive a letter, please call our office to schedule the follow-up appointment.  Any Other Special Instructions Will Be Listed Below (If Applicable).  If you need a refill on your cardiac medications before your next appointment, please call your pharmacy.  Signed, Richardson Dopp, PA-C  10/02/2016 9:08 AM    [facility] [place], [place], [place]  [address] Phone: [phone]; Fax: [phone]

## 2016-10-02 NOTE — Patient Instructions (Addendum)
Medication Instructions:  Your physician recommends that you continue on your current medications as directed. Please refer to the Current Medication list given to you today.  Labwork: BMET, LFT, CBC, TSH  Testing/Procedures: NONE  Follow-Up: Your physician wants you to follow-up in: [place] DR. Angelena Form You will receive a reminder letter in the mail two months in advance. If you don't receive a letter, please call our office to schedule the follow-up appointment.  Any Other Special Instructions Will Be Listed Below (If Applicable).  If you need a refill on your cardiac medications before your next appointment, please call your pharmacy.

## 2016-10-02 NOTE — Progress Notes (Signed)
Pt received flu shot today during his ov today with Richardson Dopp, PA.[name] Kitchen

## 2016-10-06 ENCOUNTER — Telehealth: Payer: Self-pay | Admitting: *Deleted

## 2016-10-06 DIAGNOSIS — I5042 Chronic combined systolic (congestive) and diastolic (congestive) heart failure: Secondary | ICD-10-CM

## 2016-10-06 MED ORDER — CHELATED POTASSIUM 99 MG PO TABS: 99.0000 mg | ORAL_TABLET | Freq: Every day | ORAL | Status: DC | PRN

## 2016-10-06 MED ORDER — POTASSIUM CHLORIDE CRYS ER 20 MEQ PO TBCR: 20.0000 meq | EXTENDED_RELEASE_TABLET | ORAL | Status: DC

## 2016-10-06 MED ORDER — FUROSEMIDE 20 MG PO TABS: 20.0000 mg | ORAL_TABLET | ORAL | Status: DC

## 2016-10-06 NOTE — Telephone Encounter (Signed)
Pt notified of lab results and recommendations. Pt aware to change lasix to 20 mg every other day; change K+ 20 meq to every other day. Pt states he does not think he is taking K+. I asked pt to look at his medications when he gets home from work and let me know if he is or is not taking K+. Pt said he will call his wife at home and see if she can look for the K+ and they will let us know. Pt agreeable to repeat bmet; though he does not want to have lab work done until 12/29.

## 2016-10-06 NOTE — Addendum Note (Signed)
Addended by: Michae Kava on: 10/06/2016 01:56 PM   Modules accepted: Orders

## 2016-10-06 NOTE — Telephone Encounter (Signed)
Claire Shown, PA-C   10/06/2016 4:54 PM

## 2016-10-06 NOTE — Telephone Encounter (Signed)
Pt called to advise he is not taking prescription K+ as we have listed on his chart. Pt states he take an OTC K+ 99 mg as needed when he has leg cramps.   I d/w Richardson Dopp, PA about the K+. Per Brynda Rim. PA pt is fine to continue the OTC K+ that he has been taking. I called back and s/w pt's wife who has been made aware of recommendations per Brynda Rim. PA to still have pt continue with the other recommendations as discussed earlier today; to change lasix 20 mg to every other day, bmet 2 weeks though pt request 12/29. Pt's wife verbalized understanding to plan of care for the pt.

## 2016-10-30 ENCOUNTER — Other Ambulatory Visit: Payer: BLUE CROSS/BLUE SHIELD | Admitting: *Deleted

## 2016-10-30 DIAGNOSIS — I5042 Chronic combined systolic (congestive) and diastolic (congestive) heart failure: Secondary | ICD-10-CM

## 2016-10-30 LAB — BASIC METABOLIC PANEL
BUN: 22 mg/dL (ref 7–25)
CALCIUM: 9.2 mg/dL (ref 8.6–10.3)
CHLORIDE: 102 mmol/L (ref 98–110)
CO2: 26 mmol/L (ref 20–31)
Creat: 1.67 mg/dL — ABNORMAL HIGH (ref 0.70–1.18)
GLUCOSE: 130 mg/dL — AB (ref 65–99)
POTASSIUM: 4.5 mmol/L (ref 3.5–5.3)
SODIUM: 138 mmol/L (ref 135–146)

## 2016-11-03 ENCOUNTER — Telehealth: Payer: Self-pay | Admitting: *Deleted

## 2016-11-03 NOTE — Telephone Encounter (Signed)
Pt notified of lab results by phone with verbal understanding to findings.

## 2016-12-04 ENCOUNTER — Telehealth: Payer: Self-pay | Admitting: Physician Assistant

## 2016-12-04 DIAGNOSIS — I5042 Chronic combined systolic (congestive) and diastolic (congestive) heart failure: Secondary | ICD-10-CM

## 2016-12-04 MED ORDER — FUROSEMIDE 20 MG PO TABS: 20.0000 mg | ORAL_TABLET | Freq: Every day | ORAL | 3 refills | Status: DC

## 2016-12-04 NOTE — Telephone Encounter (Signed)
New Message  Pt voiced for nurse to call him back.

## 2016-12-04 NOTE — Telephone Encounter (Signed)
Ok to resume Once daily. BMET 2 weeks. Richardson Dopp, PA-C   12/04/2016 5:15 PM

## 2016-12-04 NOTE — Telephone Encounter (Signed)
I called pt back and and he wanted to tell me that the Lasix 20 mg every other day was not working and that his weight was staying increased. Pt stated he went back to taking the Lasix 20 mg every day like he used to and his weight is much better. Pt asked for a refill to be sent to [facility] as Lasix 20 mg daily. I advised pt his weight and swelling are doing ok, pt answered yes and he is not sob. I advised I will send in Rx though [place] W. PA will probably want to check kidney function in about 1-2 weeks. Pt agreeable to plan of care. There is a recall for Dr.McAlhany for May 2018.

## 2016-12-08 NOTE — Addendum Note (Signed)
Addended by: Michae Kava on: 12/08/2016 02:49 PM   Modules accepted: Orders

## 2016-12-08 NOTE — Telephone Encounter (Signed)
Pt aware PA ok with how pt is currently taking lasix though he does need BMET in 2 weeks. Pt agreeable to [facility] 2/16.

## 2016-12-18 ENCOUNTER — Other Ambulatory Visit: Payer: BLUE CROSS/BLUE SHIELD

## 2016-12-25 ENCOUNTER — Other Ambulatory Visit: Payer: BLUE CROSS/BLUE SHIELD | Admitting: *Deleted

## 2016-12-25 ENCOUNTER — Telehealth: Payer: Self-pay | Admitting: *Deleted

## 2016-12-25 DIAGNOSIS — I5042 Chronic combined systolic (congestive) and diastolic (congestive) heart failure: Secondary | ICD-10-CM

## 2016-12-25 LAB — BASIC METABOLIC PANEL
BUN/Creatinine Ratio: 11 (ref 10–24)
BUN: 16 mg/dL (ref 8–27)
CHLORIDE: 100 mmol/L (ref 96–106)
CO2: 23 mmol/L (ref 18–29)
Calcium: 9 mg/dL (ref 8.6–10.2)
Creatinine, Ser: 1.46 mg/dL — ABNORMAL HIGH (ref 0.76–1.27)
GFR calc non Af Amer: 46 mL/min/{1.73_m2} — ABNORMAL LOW (ref >59)
GFR, EST AFRICAN AMERICAN: 53 mL/min/{1.73_m2} — AB (ref >59)
Glucose: 184 mg/dL — ABNORMAL HIGH (ref 65–99)
Potassium: 4.2 mmol/L (ref 3.5–5.2)
SODIUM: 141 mmol/L (ref 134–144)

## 2016-12-25 NOTE — Telephone Encounter (Signed)
Both pt and his wife notified of lab results by phone with verbal understanding to plan of care.

## 2017-02-17 DIAGNOSIS — Z9861 Coronary angioplasty status: Secondary | ICD-10-CM | POA: Diagnosis not present

## 2017-02-17 DIAGNOSIS — E1140 Type 2 diabetes mellitus with diabetic neuropathy, unspecified: Secondary | ICD-10-CM | POA: Diagnosis not present

## 2017-02-17 DIAGNOSIS — M79671 Pain in right foot: Secondary | ICD-10-CM | POA: Diagnosis not present

## 2017-02-17 DIAGNOSIS — I2510 Atherosclerotic heart disease of native coronary artery without angina pectoris: Secondary | ICD-10-CM | POA: Diagnosis not present

## 2017-02-17 DIAGNOSIS — D649 Anemia, unspecified: Secondary | ICD-10-CM | POA: Diagnosis not present

## 2017-02-23 ENCOUNTER — Other Ambulatory Visit: Payer: Self-pay | Admitting: Physician Assistant

## 2017-02-23 DIAGNOSIS — I5022 Chronic systolic (congestive) heart failure: Secondary | ICD-10-CM

## 2017-02-23 DIAGNOSIS — M21371 Foot drop, right foot: Secondary | ICD-10-CM | POA: Diagnosis not present

## 2017-02-23 DIAGNOSIS — I10 Essential (primary) hypertension: Secondary | ICD-10-CM

## 2017-02-24 ENCOUNTER — Other Ambulatory Visit: Payer: Self-pay | Admitting: Cardiovascular Disease

## 2017-02-24 DIAGNOSIS — I25119 Atherosclerotic heart disease of native coronary artery with unspecified angina pectoris: Secondary | ICD-10-CM

## 2017-02-24 MED ORDER — ISOSORBIDE MONONITRATE ER 30 MG PO TB24: 30.0000 mg | ORAL_TABLET | Freq: Every day | ORAL | 2 refills | Status: DC

## 2017-02-24 MED ORDER — CLOPIDOGREL BISULFATE 75 MG PO TABS: ORAL_TABLET | ORAL | 2 refills | Status: DC

## 2017-02-24 MED ORDER — ATORVASTATIN CALCIUM 20 MG PO TABS: 20.0000 mg | ORAL_TABLET | Freq: Every day | ORAL | 2 refills | Status: DC

## 2017-02-24 MED ORDER — AMIODARONE HCL 200 MG PO TABS: 200.0000 mg | ORAL_TABLET | Freq: Every day | ORAL | 2 refills | Status: DC

## 2017-03-08 DIAGNOSIS — M21371 Foot drop, right foot: Secondary | ICD-10-CM | POA: Diagnosis not present

## 2017-03-10 DIAGNOSIS — G629 Polyneuropathy, unspecified: Secondary | ICD-10-CM | POA: Diagnosis not present

## 2017-03-11 ENCOUNTER — Other Ambulatory Visit: Payer: Self-pay | Admitting: Physical Medicine and Rehabilitation

## 2017-03-11 DIAGNOSIS — R937 Abnormal findings on diagnostic imaging of other parts of musculoskeletal system: Secondary | ICD-10-CM

## 2017-04-08 DIAGNOSIS — M21371 Foot drop, right foot: Secondary | ICD-10-CM | POA: Diagnosis not present

## 2017-04-17 ENCOUNTER — Emergency Department (HOSPITAL_COMMUNITY)
Admission: EM | Admit: 2017-04-17 | Discharge: 2017-04-17 | Discharge status: Against Medical Advice | Payer: BLUE CROSS/BLUE SHIELD | Attending: Emergency Medicine | Admitting: Emergency Medicine

## 2017-04-17 ENCOUNTER — Emergency Department (HOSPITAL_COMMUNITY): Payer: BLUE CROSS/BLUE SHIELD

## 2017-04-17 ENCOUNTER — Encounter (HOSPITAL_COMMUNITY): Payer: Self-pay | Admitting: Emergency Medicine

## 2017-04-17 DIAGNOSIS — Z7984 Long term (current) use of oral hypoglycemic drugs: Secondary | ICD-10-CM | POA: Diagnosis not present

## 2017-04-17 DIAGNOSIS — Z87891 Personal history of nicotine dependence: Secondary | ICD-10-CM | POA: Insufficient documentation

## 2017-04-17 DIAGNOSIS — Z955 Presence of coronary angioplasty implant and graft: Secondary | ICD-10-CM | POA: Diagnosis not present

## 2017-04-17 DIAGNOSIS — R079 Chest pain, unspecified: Secondary | ICD-10-CM | POA: Diagnosis present

## 2017-04-17 DIAGNOSIS — R0789 Other chest pain: Secondary | ICD-10-CM | POA: Diagnosis not present

## 2017-04-17 DIAGNOSIS — I5042 Chronic combined systolic (congestive) and diastolic (congestive) heart failure: Secondary | ICD-10-CM | POA: Insufficient documentation

## 2017-04-17 DIAGNOSIS — Z79899 Other long term (current) drug therapy: Secondary | ICD-10-CM | POA: Insufficient documentation

## 2017-04-17 DIAGNOSIS — E119 Type 2 diabetes mellitus without complications: Secondary | ICD-10-CM | POA: Insufficient documentation

## 2017-04-17 DIAGNOSIS — I110 Hypertensive heart disease with heart failure: Secondary | ICD-10-CM | POA: Diagnosis not present

## 2017-04-17 DIAGNOSIS — I252 Old myocardial infarction: Secondary | ICD-10-CM | POA: Insufficient documentation

## 2017-04-17 DIAGNOSIS — Z7901 Long term (current) use of anticoagulants: Secondary | ICD-10-CM | POA: Diagnosis not present

## 2017-04-17 DIAGNOSIS — I2510 Atherosclerotic heart disease of native coronary artery without angina pectoris: Secondary | ICD-10-CM | POA: Diagnosis not present

## 2017-04-17 DIAGNOSIS — R0602 Shortness of breath: Secondary | ICD-10-CM | POA: Diagnosis not present

## 2017-04-17 LAB — CBC
HCT: 36 % — ABNORMAL LOW (ref 39.0–52.0)
HEMOGLOBIN: 11.5 g/dL — AB (ref 13.0–17.0)
MCH: 29.4 pg (ref 26.0–34.0)
MCHC: 31.9 g/dL (ref 30.0–36.0)
MCV: 92.1 fL (ref 78.0–100.0)
Platelets: 185 10*3/uL (ref 150–400)
RBC: 3.91 MIL/uL — AB (ref 4.22–5.81)
RDW: 13.9 % (ref 11.5–15.5)
WBC: 7.4 10*3/uL (ref 4.0–10.5)

## 2017-04-17 LAB — BASIC METABOLIC PANEL
ANION GAP: 8 (ref 5–15)
BUN: 21 mg/dL — ABNORMAL HIGH (ref 6–20)
CO2: 25 mmol/L (ref 22–32)
Calcium: 9.1 mg/dL (ref 8.9–10.3)
Chloride: 102 mmol/L (ref 101–111)
Creatinine, Ser: 1.74 mg/dL — ABNORMAL HIGH (ref 0.61–1.24)
GFR calc non Af Amer: 36 mL/min — ABNORMAL LOW (ref >60)
GFR, EST AFRICAN AMERICAN: 42 mL/min — AB (ref >60)
Glucose, Bld: 119 mg/dL — ABNORMAL HIGH (ref 65–99)
POTASSIUM: 4 mmol/L (ref 3.5–5.1)
Sodium: 135 mmol/L (ref 135–145)

## 2017-04-17 LAB — BRAIN NATRIURETIC PEPTIDE: B Natriuretic Peptide: 393.9 pg/mL — ABNORMAL HIGH (ref 0.0–100.0)

## 2017-04-17 LAB — I-STAT TROPONIN, ED: Troponin i, poc: 0.02 ng/mL (ref 0.00–0.08)

## 2017-04-17 NOTE — ED Triage Notes (Signed)
Pt states "I've had shortness of breath and chest hurting, yesterdya it hurt and I got short of breath." Pt denies SOB or CP at this time.. Pt has five stents.

## 2017-04-17 NOTE — ED Notes (Signed)
Pt eloped.

## 2017-04-17 NOTE — Discharge Instructions (Signed)
Please understand that you are leaving against medical advice today and risks of leaving include having a heart attack or death. Please follow-up with Dr. Julianne Handler sooner than your appointment next month if possible. Please return to the emergency department immediately if you develop any new or worsening symptoms.

## 2017-04-17 NOTE — ED Notes (Signed)
ED Provider at bedside. 

## 2017-04-17 NOTE — ED Provider Notes (Signed)
[facility] DEPT Provider Note   CSN: [id number] Arrival date & time: 04/17/17  1305     History   Chief Complaint Chief Complaint  Patient presents with  . Chest Pain    HPI JHALEN ELEY is a 76 y.o. male with history of CAD, CHF who presents with a 2 day history of shortness of breath on exertion. Yesterday patient had associated chest pain with walking. His symptoms resolved after 5 minutes of rest. Patient has not had any chest pain today, but continues to have shortness of breath on exertion. He denies any shortness of breath at rest. He has not taken any medications at home for his symptoms. Patient is on Plavix and Xarelto. Patient's cardiologist is Dr. Julianne Handler and he has an appointment on July 13. Patient denies any fevers, abdominal pain, nausea, vomiting, urinary symptoms, new leg pain or swelling.  HPI  Past Medical History:  Diagnosis Date  . Acute renal insufficiency    a. During 11/2013 adm with atrial flutter.  . Anemia, unspecified   . CAD (coronary artery disease), native coronary artery    a. s/p cath May 2011 >> DES distal RCA;  b. 01/2013 NSTEMI >> OM1 99p (2.75x12 Promus Premier DES);  c. LHC (2/15): PCI: Promus DES to dist RCA ;  d. LHC 1/16 - dLM 45, oLAD 30, small D1 tandem 50, pOM1 stent ok, mOM 50-60, dOM bifurcation 50-60 in both vessels, AV 27-70 jailed by stent, pRCA 13 mRCA stent ok, dRCA stent ok, PLB smal 50, dPDA 60, 80; EF 35-40 >> med Rx - PCI of PDA if angina  . Chronic combined systolic and diastolic CHF (congestive heart failure) ([place]) 12/23/2015   Echo 2/17: Mild LVH, EF 45-50%, inferior akinesis, grade 2 diastolic dysfunction, mild AI, mild MR, mild LAE, PASP 44 mmHg   . Colorectal polyps 2002  . Diabetes mellitus    AODM  . Erectile dysfunction   . History of echocardiogram    a. 01/2013 Echo: EF 55%, mid-dist inflat HK, Gr1 DD, Triv AI/TR, Mild MR.;  b.  Echo (1/15): Mild LVH, EF 55%, inferolateral hypokinesis, grade 1 diastolic  dysfunction, mild AI, trivial MR, moderate LAE, PASP 36 mmHg   . Hx of cardiovascular stress test    Stress myoview 08/07/13 with LVEF 44%, inferior scar, no ischemia  . Hyperlipidemia   . Hypertension   . Ischemic cardiomyopathy    a. EF 35-40% by LHC in 1/16; b. Echo 2/17: Mild LVH, EF 45-50%, inferior akinesis, grade 2 diastolic dysfunction, mild AI, mild MR, mild LAE, PASP 44 mmHg  . Myocardial infarction ([place])   . PAF (paroxysmal atrial fibrillation) ([place])    a. Dx 11/2013. Spont conv. Placed on eliquis.    Patient Active Problem List   Diagnosis Date Noted  . Chronic combined systolic and diastolic CHF (congestive heart failure) ([place]) 12/23/2015  . Exertional angina ([facility]) 11/21/2014  . Unstable angina ([place]) 12/26/2013  . Atrial flutter ([place]) 11/03/2013  . NSTEMI (non-ST elevated myocardial infarction) ([place]) 02/17/2013  . ST elevation myocardial infarction (STEMI) of inferior wall, initial episode of care ([place]) 10/02/2012  . CAD (coronary artery disease) 10/02/2012  . Type 2 diabetes mellitus with vascular disease ([place]) 09/18/2010  . HYPERTENSION 03/28/2010  . COLONIC POLYPS, RECURRENT 04/26/2008  . HYPERLIPIDEMIA 10/07/2007  . ERECTILE DYSFUNCTION 10/07/2007    Past Surgical History:  Procedure Laterality Date  . COLONOSCOPY W/ POLYPECTOMY  2002   [facility]  . COLONOSCOPY W/ POLYPECTOMY  2004;  2007 negative  . CORONARY ANGIOPLASTY  12/26/2013  . CORONARY STENT PLACEMENT  2007, 2011  . infantile paralysis facial asymmetry    . LEFT HEART CATHETERIZATION WITH CORONARY ANGIOGRAM N/A 10/02/2012   Procedure: LEFT HEART CATHETERIZATION WITH CORONARY ANGIOGRAM;  Surgeon: Peter M Martinique, MD;  Location: [facility] CATH LAB;  Service: Cardiovascular;  Laterality: N/A;  . LEFT HEART CATHETERIZATION WITH CORONARY ANGIOGRAM N/A 02/20/2013   Procedure: LEFT HEART CATHETERIZATION WITH CORONARY ANGIOGRAM;  Surgeon: Burnell Blanks, MD;  Location: [facility] CATH LAB;  Service: Cardiovascular;   Laterality: N/A;  . LEFT HEART CATHETERIZATION WITH CORONARY ANGIOGRAM N/A 12/26/2013   Procedure: LEFT HEART CATHETERIZATION WITH CORONARY ANGIOGRAM;  Surgeon: Burnell Blanks, MD;  Location: [facility] CATH LAB;  Service: Cardiovascular;  Laterality: N/A;  . LEFT HEART CATHETERIZATION WITH CORONARY ANGIOGRAM N/A 11/28/2014   Procedure: LEFT HEART CATHETERIZATION WITH CORONARY ANGIOGRAM;  Surgeon: Burnell Blanks, MD;  Location: [facility] CATH LAB;  Service: Cardiovascular;  Laterality: N/A;  . PERCUTANEOUS CORONARY STENT INTERVENTION (PCI-S)  10/02/2012   Procedure: PERCUTANEOUS CORONARY STENT INTERVENTION (PCI-S);  Surgeon: Peter M Martinique, MD;  Location: [facility] CATH LAB;  Service: Cardiovascular;;  . PERCUTANEOUS CORONARY STENT INTERVENTION (PCI-S)  02/20/2013   Procedure: PERCUTANEOUS CORONARY STENT INTERVENTION (PCI-S);  Surgeon: Burnell Blanks, MD;  Location: [facility] CATH LAB;  Service: Cardiovascular;;  . PERCUTANEOUS CORONARY STENT INTERVENTION (PCI-S)  12/26/2013   Procedure: PERCUTANEOUS CORONARY STENT INTERVENTION (PCI-S);  Surgeon: Burnell Blanks, MD;  Location: [facility] CATH LAB;  Service: Cardiovascular;;  . WISDOM TOOTH EXTRACTION         Home Medications    Prior to Admission medications   Medication Sig Start Date End Date Taking? Authorizing Provider  amiodarone (PACERONE) 200 MG tablet Take 1 tablet (200 mg total) by mouth daily. 02/24/17  Yes Burnell Blanks, MD  amLODipine (NORVASC) 5 MG tablet Take 1 tablet (5 mg total) by mouth daily. 02/24/17  Yes Weaver, Scott T, PA-C  atorvastatin (LIPITOR) 20 MG tablet Take 1 tablet (20 mg total) by mouth daily. Patient taking differently: Take 20 mg by mouth at bedtime.  02/24/17  Yes Burnell Blanks, MD  Chelated Potassium 99 MG TABS Take 99 mg by mouth daily as needed. Patient taking differently: Take 99 mg by mouth daily as needed (FOR SUPPLEMENTATION).  10/06/16  Yes Weaver, Scott T, PA-C  cholecalciferol (VITAMIN D)  1000 UNITS tablet Take 1,000 Units by mouth every evening.    Yes [provider]  clopidogrel (PLAVIX) 75 MG tablet TAKE 1 TABLET EVERY DAY WITH BREAKFAST Patient taking differently: Take 75 mg by mouth daily with breakfast.  02/24/17  Yes McAlhany, Annita Brod, MD  ELIQUIS 5 MG TABS tablet TAKE ONE TABLET BY MOUTH TWICE DAILY Patient taking differently: Take 5 mg by mouth two times a day 04/28/16  Yes Burnell Blanks, MD  fexofenadine (ALLEGRA) 180 MG tablet Take 180 mg by mouth daily as needed for allergies.    Yes [provider]  furosemide (LASIX) 20 MG tablet Take 1 tablet (20 mg total) by mouth daily. 12/04/16 04/17/17 Yes Weaver, Scott T, PA-C  isosorbide mononitrate (IMDUR) 30 MG 24 hr tablet Take 1 tablet (30 mg total) by mouth daily. 02/24/17  Yes Burnell Blanks, MD  lisinopril (PRINIVIL,ZESTRIL) 20 MG tablet Take 1 tablet (20 mg total) by mouth daily. 07/09/16  Yes Burnell Blanks, MD  metFORMIN (GLUCOPHAGE) 500 MG tablet Take 500 mg by mouth at bedtime.  Yes [provider]  metoprolol (LOPRESSOR) 50 MG tablet TAKE 1 TABLET TWICE DAILY Patient taking differently: Take 50 mg by mouth two times a day 02/28/15  Yes Burnell Blanks, MD  Multiple Vitamin (MULTIVITAMIN) tablet Take 1 tablet by mouth daily.     Yes [provider]  nitroGLYCERIN (NITROSTAT) 0.4 MG SL tablet Place 0.4 mg under the tongue every 5 (five) minutes as needed for chest pain.   Yes [provider]  vitamin E 100 UNIT capsule Take 100 Units by mouth daily.   Yes [provider]  meclizine (ANTIVERT) 12.5 MG tablet Take 1 tablet (12.5 mg total) by mouth 3 (three) times daily as needed for dizziness. Patient not taking: Reported on 04/17/2017 08/03/15   Charlesetta Shanks, MD    Family History Family History  Problem Relation Age of Onset  . Heart attack Brother 29  . Colon cancer Brother   . Diabetes Neg Hx   . Stroke Neg Hx   .  Hypertension Neg Hx     Social History Social History  Substance Use Topics  . Smoking status: Former Smoker    Quit date: 11/02/1984  . Smokeless tobacco: Never Used     Comment: smoked [place], up to 3 ppd (!)  . Alcohol use No     Allergies   Brilinta [ticagrelor] and Crestor [rosuvastatin]   Review of Systems Review of Systems  Constitutional: Negative for chills and fever.  HENT: Negative for facial swelling and sore throat.   Respiratory: Positive for shortness of breath (on exertion).   Cardiovascular: Positive for chest pain (none today).  Gastrointestinal: Negative for abdominal pain, nausea and vomiting.  Genitourinary: Negative for dysuria.  Musculoskeletal: Negative for back pain.  Skin: Negative for rash and wound.  Neurological: Negative for headaches.  Psychiatric/Behavioral: The patient is not nervous/anxious.      Physical Exam Updated Vital Signs BP 140/67   Pulse (!) 52   Temp 97.7 F (36.5 C)   Resp 15   Ht 5\' 11"  (1.803 m)   Wt 86.2 kg (190 lb)   SpO2 100%   BMI 26.50 kg/m   Physical Exam  Constitutional: He appears well-developed and well-nourished. No distress.  HENT:  Head: Normocephalic and atraumatic.  Mouth/Throat: Oropharynx is clear and moist. No oropharyngeal exudate.  Eyes: Conjunctivae are normal. Pupils are equal, round, and reactive to light. Right eye exhibits no discharge. Left eye exhibits no discharge. No scleral icterus.  Neck: Normal range of motion. Neck supple. No thyromegaly present.  Cardiovascular: Regular rhythm, normal heart sounds and intact distal pulses.  Exam reveals no gallop and no friction rub.   No murmur heard. Pulmonary/Chest: Effort normal and breath sounds normal. No stridor. No respiratory distress. He has no wheezes. He has no rales.  Abdominal: Soft. Bowel sounds are normal. He exhibits no distension. There is no tenderness. There is no rebound and no guarding.  Musculoskeletal: He exhibits no  edema.  Lymphadenopathy:    He has no cervical adenopathy.  Neurological: He is alert. Coordination normal.  Skin: Skin is warm and dry. No rash noted. He is not diaphoretic. No pallor.  Psychiatric: He has a normal mood and affect.  Nursing note and vitals reviewed.    ED Treatments / Results  Labs (all labs ordered are listed, but only abnormal results are displayed) Labs Reviewed  BASIC METABOLIC PANEL - Abnormal; Notable for the following:       Result Value  Glucose, Bld 119 (*)    BUN 21 (*)    Creatinine, Ser 1.74 (*)    GFR calc non Af Amer 36 (*)    GFR calc Af Amer 42 (*)    All other components within normal limits  CBC - Abnormal; Notable for the following:    RBC 3.91 (*)    Hemoglobin 11.5 (*)    HCT 36.0 (*)    All other components within normal limits  BRAIN NATRIURETIC PEPTIDE  I-STAT TROPOININ, ED    EKG  EKG Interpretation  Date/Time:  Saturday April 17 2017 13:07:40 EDT Ventricular Rate:  54 PR Interval:  132 QRS Duration: 100 QT Interval:  482 QTC Calculation: 457 R Axis:   61 Text Interpretation:  Sinus bradycardia Otherwise normal ECG Confirmed by ZAMMIT  MD, JOSEPH [id number]) on 04/17/2017 4:17:47 PM       Radiology Dg Chest 2 View  Result Date: 04/17/2017 CLINICAL DATA:  Pt experiencing SOB and tightness in his chest for the past 2 days with exertion. The symptoms go away when he rests for approximately 5 minutes. EXAM: CHEST  2 VIEW COMPARISON:  04/07/2016 FINDINGS: Heart size is upper limits normal. The lungs are free of focal consolidations and pleural effusions. Degenerative changes are seen in thoracic spine. IMPRESSION: No active cardiopulmonary disease. Electronically Signed   By: Nolon Nations M.D.   On: 04/17/2017 14:29    Procedures Procedures (including critical care time)  Medications Ordered in ED Medications - No data to display   Initial Impression / Assessment and Plan / ED Course  I have reviewed the triage vital  signs and the nursing notes.  Pertinent labs & imaging results that were available during my care of the patient were reviewed by me and considered in my medical decision making (see chart for details).     Patient left AGAINST MEDICAL ADVICE after recommendation of admission for cardiac rule out. Initial troponin negative in the ED. EKG showed sinus bradycardia. However, considering patient's known disease and anginal type symptoms, myself and Dr. Roderic Palau recommended patient be admitted for observation for cardiac rule out. Patient declined. I discussed the risks including death, of leaving against medical adivce, and patient understands. I discussed patient case with Dr. Roderic Palau who guided the patient's management and agrees with plan.   Final Clinical Impressions(s) / ED Diagnoses   Final diagnoses:  Chest pain, unspecified type    New Prescriptions New Prescriptions   No medications on file     Caryl Ada 04/17/17 1631    Milton Ferguson, MD 04/17/17 2350

## 2017-04-19 ENCOUNTER — Telehealth: Payer: Self-pay | Admitting: Cardiovascular Disease

## 2017-04-19 NOTE — Telephone Encounter (Signed)
New message    Pt c/o of Chest Pain: STAT if CP now or developed within 24 hours  1. Are you having CP right now? No not right now  2. Are you experiencing any other symptoms (ex. SOB, nausea, vomiting, sweating)? SOB, weak, sweating  3. How long have you been experiencing CP? A month off and on  4. Is your CP continuous or coming and going? Comes and goes  5. Have you taken Nitroglycerin? No   Pt scheduled an appt 04/20/17 at 11:00am with Vin Bhagat. ?

## 2017-04-19 NOTE — Telephone Encounter (Signed)
Thanks. chris

## 2017-04-19 NOTE — Progress Notes (Signed)
Cardiology Office Note    Date:  04/20/2017   ID:  MY MADARIAGA, DOB 1941/01/14, MRN [id number]  PCP:  Cyndy Freeze, MD  Cardiologist:  Dr. Angelena Form  Chief Complaint: ER follow-up for chest pain  History of Present Illness:   Gregory Bates is a 76 y.o. male with history of CAD, hypertension, hyperlipidemia, paroxysmal atrial fibrillation/flutter (on her eliquis for anticoagulation), diabetes and chronic combined systolic heart failure/ischemic cardiomyopathy presented for evaluation of chest pain.  Hx of CAD s/p inferior STEMI in 12/13 treated with a DES to the RCA, non-STEMI in 4/14 treated with a DES to the OM1, and unstable angina in 2/15 treated with a DES to the distal RCA. He had several episodes of AF with RVR in 2016. He was ultimately placed on amiodarone. Myoview in 2/17 demonstrated inferior scar but no ischemia.  EF was 39 but Echo demonstrated EF 45-50%.  Med Rx was continued.  The patient was seen in ER 04/17/17 for chest pain. Initial troponin was negative. EKG showed sinus bradycardia at rate of 54 bpm, no acute changes - personally reviewed.  His chest pain was concerning for anginal type symptoms. Recommended admission however he left AMA.  Patient had an episodes of shortness of breath and chest pain while cutting bushes. Symptoms went away with rest however reoccur with exertional activity dating to evaluation in ER. No recurrent symptoms since then. He walk yesterday > 1 mile without any chest pain or shortness of breath. Denies orthopnea, PND, syncope, palpitation, lower extremity edema, melena or blood in his stool or urine. Does have a intermittent dizziness from seating/laying to standing position.   Past Medical History:  Diagnosis Date  . Acute renal insufficiency    a. During 11/2013 adm with atrial flutter.  . Anemia, unspecified   . CAD (coronary artery disease), native coronary artery    a. s/p cath May 2011 >> DES distal RCA;  b. 01/2013 NSTEMI >> OM1 99p  (2.75x12 Promus Premier DES);  c. LHC (2/15): PCI: Promus DES to dist RCA ;  d. LHC 1/16 - dLM 53, oLAD 30, small D1 tandem 37, pOM1 stent ok, mOM 50-60, dOM bifurcation 50-60 in both vessels, AV 52-70 jailed by stent, pRCA 39 mRCA stent ok, dRCA stent ok, PLB smal 50, dPDA 60, 80; EF 35-40 >> med Rx - PCI of PDA if angina  . Chronic combined systolic and diastolic CHF (congestive heart failure) ([place]) 12/23/2015   Echo 2/17: Mild LVH, EF 45-50%, inferior akinesis, grade 2 diastolic dysfunction, mild AI, mild MR, mild LAE, PASP 44 mmHg   . Colorectal polyps 2002  . Diabetes mellitus    AODM  . Erectile dysfunction   . History of echocardiogram    a. 01/2013 Echo: EF 55%, mid-dist inflat HK, Gr1 DD, Triv AI/TR, Mild MR.;  b.  Echo (1/15): Mild LVH, EF 55%, inferolateral hypokinesis, grade 1 diastolic dysfunction, mild AI, trivial MR, moderate LAE, PASP 36 mmHg   . Hx of cardiovascular stress test    Stress myoview 08/07/13 with LVEF 44%, inferior scar, no ischemia  . Hyperlipidemia   . Hypertension   . Ischemic cardiomyopathy    a. EF 35-40% by LHC in 1/16; b. Echo 2/17: Mild LVH, EF 45-50%, inferior akinesis, grade 2 diastolic dysfunction, mild AI, mild MR, mild LAE, PASP 44 mmHg  . Myocardial infarction ([place])   . PAF (paroxysmal atrial fibrillation) ([place])    a. Dx 11/2013. Spont conv. Placed on eliquis.  Past Surgical History:  Procedure Laterality Date  . COLONOSCOPY W/ POLYPECTOMY  2002   [facility]  . COLONOSCOPY W/ POLYPECTOMY  2004; 2007 negative  . CORONARY ANGIOPLASTY  12/26/2013  . CORONARY STENT PLACEMENT  2007, 2011  . infantile paralysis facial asymmetry    . LEFT HEART CATHETERIZATION WITH CORONARY ANGIOGRAM N/A 10/02/2012   Procedure: LEFT HEART CATHETERIZATION WITH CORONARY ANGIOGRAM;  Surgeon: Peter M Martinique, MD;  [facility];  Service: Cardiovascular;  Laterality: N/A;  . LEFT HEART CATHETERIZATION WITH CORONARY ANGIOGRAM N/A 02/20/2013   Procedure: LEFT HEART  CATHETERIZATION WITH CORONARY ANGIOGRAM;  Surgeon: Burnell Blanks, MD;  Location: [facility] CATH LAB;  Service: Cardiovascular;  Laterality: N/A;  . LEFT HEART CATHETERIZATION WITH CORONARY ANGIOGRAM N/A 12/26/2013   Procedure: LEFT HEART CATHETERIZATION WITH CORONARY ANGIOGRAM;  Surgeon: Burnell Blanks, MD;  Location[facility] C[facility] LAB;  Service: Cardiovascular;  Laterality: N/A;  . LEFT HEART CATHETERIZATION WITH CORONARY ANGIOGRAM N/A 11/28/2014   Procedure: LEFT HEART CATHETERIZATION WITH CORONARY ANGIOGRAM;  Surgeon: Burnell Blanks, MD;  Location: [facility] CATH LAB;  Service: Cardiovascular;  Laterality: N/A;  . PERCUTANEOUS CORONARY STENT INTERVENTION (PCI-S)  10/02/2012   Procedure: PERCUTANEOUS CORONARY STENT INTERVENTION (PCI-S);  Surgeon: Peter M Martinique, MD;  Location: [facility] CATH LAB;  Service: Cardiovascular;;  . PERCUTANEOUS CORONARY STENT INTERVENTION (PCI-S)  02/20/2013   Procedure: PERCUTANEOUS CORONARY STENT INTERVENTION (PCI-S);  Surgeon: Burnell Blanks, MD;  Location: [facility] CATH LAB;  Service: Cardiovascular;;  . PERCUTANEOUS CORONARY STENT INTERVENTION (PCI-S)  12/26/2013   Procedure: PERCUTANEOUS CORONARY STENT INTERVENTION (PCI-S);  Surgeon: Burnell Blanks, MD;  Location: [facility] CATH LAB;  Service: Cardiovascular;;  . WISDOM TOOTH EXTRACTION      Current Medications: Prior to Admission medications   Medication Sig Start Date End Date Taking? Authorizing Provider  amiodarone (PACERONE) 200 MG tablet Take 1 tablet (200 mg total) by mouth daily. 02/24/17   Burnell Blanks, MD  amLODipine (NORVASC) 5 MG tablet Take 1 tablet (5 mg total) by mouth daily. 02/24/17   Richardson Dopp T, PA-C  atorvastatin (LIPITOR) 20 MG tablet Take 1 tablet (20 mg total) by mouth daily. Patient taking differently: Take 20 mg by mouth at bedtime.  02/24/17   Burnell Blanks, MD  Chelated Potassium 99 MG TABS Take 99 mg by mouth daily as needed. Patient taking differently: Take 99 mg  by mouth daily as needed (FOR SUPPLEMENTATION).  10/06/16   Richardson Dopp T, PA-C  cholecalciferol (VITAMIN D) 1000 UNITS tablet Take 1,000 Units by mouth every evening.     [provider]  clopidogrel (PLAVIX) 75 MG tablet TAKE 1 TABLET EVERY DAY WITH BREAKFAST Patient taking differently: Take 75 mg by mouth daily with breakfast.  02/24/17   Burnell Blanks, MD  ELIQUIS 5 MG TABS tablet TAKE ONE TABLET BY MOUTH TWICE DAILY Patient taking differently: Take 5 mg by mouth two times a day 04/28/16   Burnell Blanks, MD  fexofenadine (ALLEGRA) 180 MG tablet Take 180 mg by mouth daily as needed for allergies.     [provider]  furosemide (LASIX) 20 MG tablet Take 1 tablet (20 mg total) by mouth daily. 12/04/16 04/17/17  Richardson Dopp T, PA-C  isosorbide mononitrate (IMDUR) 30 MG 24 hr tablet Take 1 tablet (30 mg total) by mouth daily. 02/24/17   Burnell Blanks, MD  lisinopril (PRINIVIL,ZESTRIL) 20 MG tablet Take 1 tablet (20 mg total) by mouth daily. 07/09/16  Burnell Blanks, MD  meclizine (ANTIVERT) 12.5 MG tablet Take 1 tablet (12.5 mg total) by mouth 3 (three) times daily as needed for dizziness. Patient not taking: Reported on 04/17/2017 08/03/15   Charlesetta Shanks, MD  metFORMIN (GLUCOPHAGE) 500 MG tablet Take 500 mg by mouth at bedtime.    [provider]  metoprolol (LOPRESSOR) 50 MG tablet TAKE 1 TABLET TWICE DAILY Patient taking differently: Take 50 mg by mouth two times a day 02/28/15   Burnell Blanks, MD  Multiple Vitamin (MULTIVITAMIN) tablet Take 1 tablet by mouth daily.      [provider]  nitroGLYCERIN (NITROSTAT) 0.4 MG SL tablet Place 0.4 mg under the tongue every 5 (five) minutes as needed for chest pain.    [provider]  vitamin E 100 UNIT capsule Take 100 Units by mouth daily.    [provider]    Allergies:   Brilinta [ticagrelor] and Crestor [rosuvastatin]   Social History   Social  History  . Marital status: Married    Spouse name: N/A  . Number of children: 1  . Years of education: N/A   Occupational History  . maintenance     full time   Social History Main Topics  . Smoking status: Former Smoker    Quit date: 11/02/1984  . Smokeless tobacco: Never Used     Comment: smoked [place], up to 3 ppd (!)  . Alcohol use No  . Drug use: No  . Sexual activity: Not Asked   Other Topics Concern  . None   Social History Narrative  . None     Family History:  The patient's family history includes Colon cancer in his brother; Heart attack (age of onset: 54) in his brother.   ROS:   Please see the history of present illness.    ROS All other systems reviewed and are negative.   PHYSICAL EXAM:   VS:  BP 122/60   Pulse (!) 50   Ht 5\' 11"  (1.803 m)   Wt 194 lb 3.2 oz (88.1 kg)   BMI 27.09 kg/m    GEN: Well nourished, well developed, in no acute distress  HEENT: normal  Neck: no JVD, carotid bruits, or masses Cardiac: RRR; no murmurs, rubs, or gallops,no edema  Respiratory:  clear to auscultation bilaterally, normal work of breathing GI: soft, nontender, nondistended, + BS MS: no deformity or atrophy  Skin: warm and dry, no rash Neuro:  Alert and Oriented x 3, Strength and sensation are intact Psych: euthymic mood, full affect  Wt Readings from Last 3 Encounters:  04/20/17 194 lb 3.2 oz (88.1 kg)  04/17/17 190 lb (86.2 kg)  10/02/16 190 lb (86.2 kg)      Studies/Labs Reviewed:   EKG:  EKG is not ordered today.    Recent Labs: 10/02/2016: ALT 28; TSH 0.75 04/17/2017: B Natriuretic Peptide 393.9; BUN 21; Creatinine, Ser 1.74; Hemoglobin 11.5; Platelets 185; Potassium 4.0; Sodium 135   Lipid Panel    Component Value Date/Time   CHOL 170 11/04/2013 0617   TRIG 166 (H) 11/04/2013 0617   HDL 34 (L) 11/04/2013 0617   CHOLHDL 5.0 11/04/2013 0617   VLDL 33 11/04/2013 0617   LDLCALC 103 (H) 11/04/2013 0617   LDLDIRECT 69.3 11/26/2008 1256     Additional studies/ records that were reviewed today include:   Echocardiogram: 12/23/15 Study Conclusions  - Left ventricle: The cavity size was normal. There was mild   concentric hypertrophy. Systolic  function was mildly reduced. The   estimated ejection fraction was in the range of 45% to 50%.   Akinesis and scarring of the basal-midinferior myocardium;   consistent with infarction in the distribution of the right   coronary artery. Features are consistent with a pseudonormal left   ventricular filling pattern, with concomitant abnormal relaxation   and increased filling pressure (grade 2 diastolic dysfunction). - Aortic valve: There was mild regurgitation. - Mitral valve: There was mild regurgitation directed centrally. - Left atrium: The atrium was mildly dilated. - Pulmonary arteries: Systolic pressure was mildly increased. PA   peak pressure: 44 mm Hg (S).  Myoivew 12/2015  Nuclear stress EF: 39%.  Horizontal ST segment depression ST segment depression of 1 mm was noted during stress in the V5, V6, aVF, III and II leads.  Defect 1: There is a medium defect of moderate severity present in the basal inferior and mid inferior location.  Findings consistent with prior myocardial infarction.  This is an intermediate risk study.  The left ventricular ejection fraction is moderately decreased (30-44%).   Intermediate risk stress nuclear study with inferior scar and moderately depressed global systolic function. Compared to the previous study, LV function has deteriorated, but no new perfusion defects are seen. Consider superimposed nonischemic cardiomyopathy or global LV ischemia.  Cardiac Catheterization:  11/2014  Left main: Distal 20% stenosis.   Left Anterior Descending Artery: Moderate caliber vessel that does not reach the apex. The ostium has a 30% stenosis. The mid vessel has diffuse plaque. The first diagonal branch is small to moderate in caliber (2.0 mm) and  has tandem 70% stenoses which are unchanged from the last cath.   Circumflex Artery: Moderate caliber vessel with moderate caliber first obtuse marginal branch. The first OM branch arises early and has a patent stent in the proximal segment with no restenosis. The mid segment of the OM branch has a 50-60% stenosis. Distally the OM branch bifurcates and there is complex 50-60% stenosis involving both vessels beyond the branch point, non-flow limiting and unchanged from last cath. The AV groove Circumflex has 60-70% stenosis just after the takeoff of the OM branch as it is jailed by the OM stent, unchanged from last cath.    Right Coronary Artery: Large dominant vessel with severe calcification in the proximal and mid vessel. The proximal vessel has a 40% stenosis. The mid vessel has a patent stent with no restenosis. The distal vessel has a long stented segment that is patent without restenosis.  The posterolateral branch is small in caliber and has a 50% stenosis. The PDA is small to moderate in caliber (2.25 mm vessel) and in the distal segment there is a 60% stenosis followed by a 80% stenosis which is unchanged from the last cath.   Left Ventricular Angiogram: LVEF 35-40% with inferior wall hypokinesis.   Impression:  1. Triple vessel CAD 2. Patent stents in the RCA and OM 3. Moderate disease in the PDA and the OM1 4. Moderate LV systolic dysfunction  Recommendations: Will continue medical management of CAD. No focal targets for PCI. His PDA does have moderately severe disease but intervention of this vessel would be very difficult. The RCA is heavily calcified and tortuous with multiple stents in the mid and distal vessel. Delivering a stent to the PDA would be difficult. Since this area has not changed over the last year, PCI of the PDA will not be attempted today. I will increase his Imdur to 15 mg po  BID as he reports feeling great in the am but chest pain in the afternoon. If his angina  cannot be controlled with medical therapy, will have to consider PCI of the PDA and possibly the small OM branch.        Complications:  None. The patient tolerated the procedure well.             ASSESSMENT & PLAN:    1. CAD s/p DES to the RCA 12/13, DES to the OM1 4/14, and  DES to the distal RCA 2/15 - Last cath 11/2014 showed stable anatomy. Myoview 2/17 negative for ischemia. - Episodes prior to ER presentation concerning for angina. However no symptoms while exertional activity yesterday. Increase Imdur to 60 mg daily. Continue Plavix and statin.  2. Chronic combined CHF - Last echo 2/17 showed LV function of 45-50% and grade 2 diastolic dysfunction  3. PAF - Maintaining sinus rhythm on exam. Continue BB and Eliquis.   4. HLD - Continue statin.   5. Dizziness - Not orthostatic on vitals. His symptoms could be due to bradycardia. We will reduce metoprolol to 25 mg twice a day.  Orthostatic VS for the past 24 hrs:  BP- Lying Pulse- Lying BP- Sitting Pulse- Sitting BP- Standing at 0 minutes Pulse- Standing at 0 minutes  04/20/17 1121 121/61 (!) 49 115/57 (!) 49 127/60 53    6. Hypertension -BP stable and well controlled. Reduce metoprolol as discussed above. Will increase Imdur to 60 mg daily for possible angina. Continue Norvasc 5 mg.     Medication Adjustments/Labs and Tests Ordered: Current medicines are reviewed at length with the patient today.  Concerns regarding medicines are outlined above.  Medication changes, Labs and Tests ordered today are listed in the Patient Instructions below. There are no Patient Instructions on file for this visit.   Jarrett Soho, [place]  04/20/2017 11:18 AM    [address], [place], [place]  [address] Phone: [phone]; Fax: [phone]

## 2017-04-19 NOTE — Telephone Encounter (Signed)
I spoke with pt. He was in ED over the weekend with chest pain. Chest pain improved so he left ED. He reports no further chest pain or shortness of breath since ED visit. Feels good today.  He has been scheduled to see B. Bhagat, PA tomorrow and will plan on keeping this appointment.  I told him if shortness of breath or chest pain occur prior to appointment tomorrow he should go to ED for evaluation.

## 2017-04-20 ENCOUNTER — Ambulatory Visit (INDEPENDENT_AMBULATORY_CARE_PROVIDER_SITE_OTHER): Payer: BLUE CROSS/BLUE SHIELD | Admitting: Physician Assistant

## 2017-04-20 ENCOUNTER — Encounter: Payer: Self-pay | Admitting: Physician Assistant

## 2017-04-20 ENCOUNTER — Encounter (INDEPENDENT_AMBULATORY_CARE_PROVIDER_SITE_OTHER): Payer: Self-pay

## 2017-04-20 VITALS — BP 122/60 | HR 50 | Ht 71.0 in | Wt 194.2 lb

## 2017-04-20 DIAGNOSIS — I10 Essential (primary) hypertension: Secondary | ICD-10-CM | POA: Diagnosis not present

## 2017-04-20 DIAGNOSIS — I25119 Atherosclerotic heart disease of native coronary artery with unspecified angina pectoris: Secondary | ICD-10-CM | POA: Diagnosis not present

## 2017-04-20 DIAGNOSIS — I480 Paroxysmal atrial fibrillation: Secondary | ICD-10-CM | POA: Diagnosis not present

## 2017-04-20 DIAGNOSIS — R001 Bradycardia, unspecified: Secondary | ICD-10-CM | POA: Diagnosis not present

## 2017-04-20 DIAGNOSIS — E785 Hyperlipidemia, unspecified: Secondary | ICD-10-CM

## 2017-04-20 DIAGNOSIS — R42 Dizziness and giddiness: Secondary | ICD-10-CM | POA: Diagnosis not present

## 2017-04-20 MED ORDER — ISOSORBIDE MONONITRATE ER 60 MG PO TB24: 60.0000 mg | ORAL_TABLET | Freq: Every day | ORAL | 6 refills | Status: DC

## 2017-04-20 MED ORDER — METOPROLOL TARTRATE 25 MG PO TABS: 25.0000 mg | ORAL_TABLET | Freq: Two times a day (BID) | ORAL | 6 refills | Status: DC

## 2017-04-20 NOTE — Patient Instructions (Signed)
Medication Instructions:  Your physician has recommended you make the following change in your medication:  1-Increase Imdur 60 mg by mouth daily 2-Decrease Metoprolol 25 mg by mouth twice daily  Labwork: NONE  Testing/Procedures: NONE  Follow-Up: Your physician recommends that you schedule a follow-up appointment in: 1 month with Dr. Angelena Form.   If you need a refill on your cardiac medications before your next appointment, please call your pharmacy.

## 2017-05-03 ENCOUNTER — Other Ambulatory Visit: Payer: Self-pay | Admitting: Cardiovascular Disease

## 2017-05-13 NOTE — Progress Notes (Addendum)
Chief Complaint  Patient presents with  . Chest Pain     History of Present Illness: 76 yo WM with history of DM, HTN, hyperlipidemia, atrial flutter and CAD here today for cardiac follow up. He has CAD and was admitted for the first time in 2013 with an inferior STEMI and the occluded RCA was treated with a DES. He was readmitted 02/17/13 with a NSTEMI and found to have a severe stenosis in the OM branch treated with a DES. He was admitted to [facility] January 2014 with SOB and chest pain and was found to be in atrial flutter with RVR. He converted to sinus on diltiazem drip. Echo 11/05/12 with normal LV size and function. He was started on Eliquis before discharge. Repeat cardiac cath in setting of chest pain in February 2015 and found to have high grade stenosis in the distal RCA treated with a DES. He had several admissions in 2015 with atrial fib with RVR. He developed a rash on Cardizem. He has been managed with amiodarone and metoprolol and anti-coagulated with Eliquis. Repeat cardiac cath in January 2016 with stable CAD. Stress myoview 2017 with no ischemia. Echo February 2017 with LVEF=45-50%, mild AI. Mild MR. He was seen in the ED June 2018 with chest pain and troponin was negative. He was in sinus brady. Admission was recommended but he left the ED against medical advice. He was seen in our office 04/20/17 by Robbie Lis, PA-C and reported improvement in chest pain. Imdur was increased to 60 mg daily. Metoprolol reduced to 25 mg po BID due to bradycardia and dizziness.   He is here today for follow up. He c/o left sided chest pain as well as central pain for the last several weeks. This is exactly like his prior angina pain but perhaps slightly less intense. He is overall feeling well. He is having chest pains about every 2 hours that last for several seconds to several minutes. He is having no chest pain currently. He denies any dyspnea, palpitations, lower extremity edema, orthopnea, PND, dizziness,  near syncope or syncope.    Primary Care Physician: Cyndy Freeze, MD Urology: Joie Bimler, MD   Past Medical History:  Diagnosis Date  . Acute renal insufficiency    a. During 11/2013 adm with atrial flutter.  . Anemia, unspecified   . CAD (coronary artery disease), native coronary artery    a. s/p cath May 2011 >> DES distal RCA;  b. 01/2013 NSTEMI >> OM1 99p (2.75x12 Promus Premier DES);  c. LHC (2/15): PCI: Promus DES to dist RCA ;  d. LHC 1/16 - dLM 63, oLAD 30, small D1 tandem 66, pOM1 stent ok, mOM 50-60, dOM bifurcation 50-60 in both vessels, AV 41-70 jailed by stent, pRCA 76 mRCA stent ok, dRCA stent ok, PLB smal 50, dPDA 60, 80; EF 35-40 >> med Rx - PCI of PDA if angina  . Chronic combined systolic and diastolic CHF (congestive heart failure) ([place]) 12/23/2015   Echo 2/17: Mild LVH, EF 45-50%, inferior akinesis, grade 2 diastolic dysfunction, mild AI, mild MR, mild LAE, PASP 44 mmHg   . Colorectal polyps 2002  . Diabetes mellitus    AODM  . Erectile dysfunction   . History of echocardiogram    a. 01/2013 Echo: EF 55%, mid-dist inflat HK, Gr1 DD, Triv AI/TR, Mild MR.;  b.  Echo (1/15): Mild LVH, EF 55%, inferolateral hypokinesis, grade 1 diastolic dysfunction, mild AI, trivial MR, moderate LAE, PASP 36 mmHg   .  Hx of cardiovascular stress test    Stress myoview 08/07/13 with LVEF 44%, inferior scar, no ischemia  . Hyperlipidemia   . Hypertension   . Ischemic cardiomyopathy    a. EF 35-40% by LHC in 1/16; b. Echo 2/17: Mild LVH, EF 45-50%, inferior akinesis, grade 2 diastolic dysfunction, mild AI, mild MR, mild LAE, PASP 44 mmHg  . Myocardial infarction ([place])   . PAF (paroxysmal atrial fibrillation) ([place])    a. Dx 11/2013. Spont conv. Placed on eliquis.    Past Surgical History:  Procedure Laterality Date  . COLONOSCOPY W/ POLYPECTOMY  2002   [facility]  . COLONOSCOPY W/ POLYPECTOMY  2004; 2007 negative  . CORONARY ANGIOPLASTY  12/26/2013  . CORONARY STENT PLACEMENT  2007,  2011  . infantile paralysis facial asymmetry    . LEFT HEART CATHETERIZATION WITH CORONARY ANGIOGRAM N/A 10/02/2012   Procedure: LEFT HEART CATHETERIZATION WITH CORONARY ANGIOGRAM;  Surgeon: Peter M Martinique, MD;  Location[facility] CATH LAB;  Service: Cardiovascular;  Laterality: N/A;  . LEFT HEART CATHETERIZATION WITH CORONARY ANGIOGRAM N/A 02/20/2013   Procedure: LEFT HEART CATHETERIZATION WITH CORONARY ANGIOGRAM;  Surgeon: Burnell Blanks, MD;  Location: [facility] CATH LAB;  Service: Cardiovascular;  Laterality: N/A;  . LEFT HEART CATHETERIZATION WITH CORONARY ANGIOGRAM N/A 12/26/2013   Procedure: LEFT HEART CATHETERIZATION WITH CORONARY ANGIOGRAM;  Surgeon: Burnell Blanks, MD;  Location: [facility] CATH LAB;  Service: Cardiovascular;  Laterality: N/A;  . LEFT HEART CATHETERIZATION WITH CORONARY ANGIOGRAM N/A 11/28/2014   Procedure: LEFT HEART CATHETERIZATION WITH CORONARY ANGIOGRAM;  Surgeon: Burnell Blanks, MD;  Location: [facility] CATH LAB;  Service: Cardiovascular;  Laterality: N/A;  . PERCUTANEOUS CORONARY STENT INTERVENTION (PCI-S)  10/02/2012   Procedure: PERCUTANEOUS CORONARY STENT INTERVENTION (PCI-S);  Surgeon: Peter M Martinique, MD;  Location: [facility] CATH LAB;  Service: Cardiovascular;;  . PERCUTANEOUS CORONARY STENT INTERVENTION (PCI-S)  02/20/2013   Procedure: PERCUTANEOUS CORONARY STENT INTERVENTION (PCI-S);  Surgeon: Burnell Blanks, MD;  Location: [facility] CATH LAB;  Service: Cardiovascular;;  . PERCUTANEOUS CORONARY STENT INTERVENTION (PCI-S)  12/26/2013   Procedure: PERCUTANEOUS CORONARY STENT INTERVENTION (PCI-S);  Surgeon: Burnell Blanks, MD;  Location: [facility] CATH LAB;  Service: Cardiovascular;;  . WISDOM TOOTH EXTRACTION      Current Outpatient Prescriptions  Medication Sig Dispense Refill  . amiodarone (PACERONE) 200 MG tablet Take 1 tablet (200 mg total) by mouth daily. 90 tablet 2  . amLODipine (NORVASC) 5 MG tablet Take 1 tablet (5 mg total) by mouth daily. 90 tablet 1  .  apixaban (ELIQUIS) 5 MG TABS tablet Take 5 mg by mouth 2 (two) times daily.    [name] Kitchen atorvastatin (LIPITOR) 20 MG tablet Take 20 mg by mouth at bedtime.    . Chelated Potassium 99 MG TABS Take 99 mg by mouth daily as needed (supplementation).    . cholecalciferol (VITAMIN D) 1000 UNITS tablet Take 1,000 Units by mouth every evening.     . clopidogrel (PLAVIX) 75 MG tablet Take 75 mg by mouth daily.    [name] Kitchen ELIQUIS 5 MG TABS tablet TAKE ONE TABLET BY MOUTH TWICE DAILY 180 tablet 1  . fexofenadine (ALLEGRA) 180 MG tablet Take 180 mg by mouth daily as needed for allergies.     . furosemide (LASIX) 20 MG tablet Take 20 mg by mouth daily.    . isosorbide mononitrate (IMDUR) 60 MG 24 hr tablet Take 1 tablet (60 mg total) by mouth daily. 30 tablet 6  . lisinopril (PRINIVIL,ZESTRIL) 20 MG tablet  Take 1 tablet (20 mg total) by mouth daily. 90 tablet 2  . metFORMIN (GLUCOPHAGE) 500 MG tablet Take 500 mg by mouth at bedtime.    . metoprolol tartrate (LOPRESSOR) 25 MG tablet Take 1 tablet (25 mg total) by mouth 2 (two) times daily. 60 tablet 6  . Multiple Vitamin (MULTIVITAMIN) tablet Take 1 tablet by mouth daily.      . nitroGLYCERIN (NITROSTAT) 0.4 MG SL tablet Place 0.4 mg under the tongue every 5 (five) minutes as needed for chest pain.    . vitamin E 100 UNIT capsule Take 100 Units by mouth daily.     No current facility-administered medications for this visit.     Allergies  Allergen Reactions  . Brilinta [Ticagrelor] Other (See Comments)    Arthralgias & myalgias  . Crestor [Rosuvastatin] Other (See Comments)    Myalgias    Social History   Social History  . Marital status: Married    Spouse name: N/A  . Number of children: 1  . Years of education: N/A   Occupational History  . maintenance     full time   Social History Main Topics  . Smoking status: Former Smoker    Quit date: 11/02/1984  . Smokeless tobacco: Never Used     Comment: smoked [place], up to 3 ppd (!)  . Alcohol use No    . Drug use: No  . Sexual activity: Not on file   Other Topics Concern  . Not on file   Social History Narrative  . No narrative on file    Family History  Problem Relation Age of Onset  . Heart attack Brother 48  . Colon cancer Brother   . Diabetes Neg Hx   . Stroke Neg Hx   . Hypertension Neg Hx     Review of Systems:  As stated in the HPI and otherwise negative.   BP 102/64   Pulse 61   Ht 5\' 11"  (1.803 m)   Wt 186 lb (84.4 kg)   SpO2 97%   BMI 25.94 kg/m   Physical Examination:  General: Well developed, well nourished, NAD  HEENT: OP clear, mucus membranes moist  SKIN: warm, dry. No rashes. Neuro: No focal deficits  Musculoskeletal: Muscle strength 5/5 all ext  Psychiatric: Mood and affect normal  Neck: No JVD, no carotid bruits, no thyromegaly, no lymphadenopathy.  Lungs:Clear bilaterally, no wheezes, rhonci, crackles Cardiovascular: Regular, brady. No murmurs, gallops or rubs. Abdomen:Soft. Bowel sounds present. Non-tender.  Extremities: No lower extremity edema. Pulses are 2 + in the bilateral DP/PT.  Cardiac cath 11/28/14: Left main: Distal 20% stenosis.  Left Anterior Descending Artery: Moderate caliber vessel that does not reach the apex. The ostium has a 30% stenosis. The mid vessel has diffuse plaque. The first diagonal branch is small to moderate in caliber (2.0 mm) and has tandem 70% stenoses which are unchanged from the last cath.  Circumflex Artery: Moderate caliber vessel with moderate caliber first obtuse marginal branch. The first OM branch arises early and has a patent stent in the proximal segment with no restenosis. The mid segment of the OM branch has a 50-60% stenosis. Distally the OM branch bifurcates and there is complex 50-60% stenosis involving both vessels beyond the branch point, non-flow limiting and unchanged from last cath. The AV groove Circumflex has 60-70% stenosis just after the takeoff of the OM branch as it is jailed by the OM  stent, unchanged from last cath.  Right Coronary  Artery: Large dominant vessel with severe calcification in the proximal and mid vessel. The proximal vessel has a 40% stenosis. The mid vessel has a patent stent with no restenosis. The distal vessel has a long stented segment that is patent without restenosis. The posterolateral branch is small in caliber and has a 50% stenosis. The PDA is small to moderate in caliber (2.25 mm vessel) and in the distal segment there is a 60% stenosis followed by a 80% stenosis which is unchanged from the last cath.  Left Ventricular Angiogram: LVEF 35-40% with inferior wall hypokinesis.    Echo 12/23/15: Left ventricle: The cavity size was normal. There was mild  concentric hypertrophy. Systolic function was mildly reduced. The  estimated ejection fraction was in the range of 45% to 50%.  Akinesis and scarring of the basal-midinferior myocardium;  consistent with infarction in the distribution of the right  coronary artery. Features are consistent with a pseudonormal left  ventricular filling pattern, with concomitant abnormal relaxation  and increased filling pressure (grade 2 diastolic dysfunction). - Aortic valve: There was mild regurgitation. - Mitral valve: There was mild regurgitation directed centrally. - Left atrium: The atrium was mildly dilated. - Pulmonary arteries: Systolic pressure was mildly increased. PA  peak pressure: 44 mm Hg (S).  EKG:  EKG is ordered today. The ekg ordered today demonstrates Sinus brady, rate 58 bpm. Old small inferior Q waves. Diffuse T wave flattening, Unchanged.   Recent Labs: 10/02/2016: ALT 28; TSH 0.75 04/17/2017: B Natriuretic Peptide 393.9; BUN 21; Creatinine, Ser 1.74; Hemoglobin 11.5; Platelets 185; Potassium 4.0; Sodium 135      Wt Readings from Last 3 Encounters:  05/14/17 186 lb (84.4 kg)  04/20/17 194 lb 3.2 oz (88.1 kg)  04/17/17 190 lb (86.2 kg)     Other studies Reviewed: Additional  studies/ records that were reviewed today include: . Review of the above records demonstrates:    Assessment and Plan:   1. CAD with unstable angina: He continues to have chest pain despite elevated dose of Imdur. I am concerned that he has progression of his CAD. He has no chest pain this am. His EKG shows no ischemic changes.  Will continue Imdur, Plavix, statin, beta blocker. No ASA with current use of Eliquis  Will plan cardiac cath 05/18/17 at [facility] with Dr. Martinique. I will be out on vacation next week. Risks and benefits of cath reviewed with pt. He agrees to proceed. Will hold Eliquis 48 hours before cath. Pre-cath labs today  2. HTN: BP is controlled. No changes  3. Hyperlipidemia: Lipids well controlled in primary care. Continue statin.   4. Atrial fibrillation/flutter, paroxysmal: Sinus today. Continue amiodarone and metoprolol. Continue Eliquis but hold for cath  5. Chronic diastolic CHF: Last echo February 2017 with ZWCH=85-27%, grade 2 diastolic dysfunction. Volume status is ok on daily Lasix.   Current medicines are reviewed at length with the patient today.  The patient does not have concerns regarding medicines.  The following changes have been made:  no change  Labs/ tests ordered today include:   Orders Placed This Encounter  Procedures  . Basic Metabolic Panel (BMET)  . CBC w/Diff  . INR/PT  . EKG 12-Lead    Disposition:   FU in our office post cath.   Signed, Lauree Chandler, MD 05/14/2017 10:11 AM    [facility] [address], [place], [place]  [address] Phone: [phone]; Fax: [phone]

## 2017-05-14 ENCOUNTER — Encounter: Payer: Self-pay | Admitting: *Deleted

## 2017-05-14 ENCOUNTER — Encounter (INDEPENDENT_AMBULATORY_CARE_PROVIDER_SITE_OTHER): Payer: Self-pay

## 2017-05-14 ENCOUNTER — Encounter: Payer: Self-pay | Admitting: Cardiovascular Disease

## 2017-05-14 ENCOUNTER — Ambulatory Visit (INDEPENDENT_AMBULATORY_CARE_PROVIDER_SITE_OTHER): Payer: BLUE CROSS/BLUE SHIELD | Admitting: Cardiovascular Disease

## 2017-05-14 VITALS — BP 102/64 | HR 61 | Ht 71.0 in | Wt 186.0 lb

## 2017-05-14 DIAGNOSIS — I480 Paroxysmal atrial fibrillation: Secondary | ICD-10-CM

## 2017-05-14 DIAGNOSIS — E785 Hyperlipidemia, unspecified: Secondary | ICD-10-CM

## 2017-05-14 DIAGNOSIS — I10 Essential (primary) hypertension: Secondary | ICD-10-CM | POA: Diagnosis not present

## 2017-05-14 DIAGNOSIS — I25110 Atherosclerotic heart disease of native coronary artery with unstable angina pectoris: Secondary | ICD-10-CM | POA: Diagnosis not present

## 2017-05-14 LAB — PROTIME-INR
INR: 1.1 (ref 0.8–1.2)
PROTHROMBIN TIME: 11.2 s (ref 9.1–12.0)

## 2017-05-14 LAB — CBC WITH DIFFERENTIAL/PLATELET
BASOS ABS: 0.1 10*3/uL (ref 0.0–0.2)
BASOS: 1 %
EOS (ABSOLUTE): 0.4 10*3/uL (ref 0.0–0.4)
Eos: 4 %
Hematocrit: 37.3 % — ABNORMAL LOW (ref 37.5–51.0)
Hemoglobin: 12.4 g/dL — ABNORMAL LOW (ref 13.0–17.7)
IMMATURE GRANS (ABS): 0 10*3/uL (ref 0.0–0.1)
IMMATURE GRANULOCYTES: 0 %
LYMPHS: 20 %
Lymphocytes Absolute: 1.8 10*3/uL (ref 0.7–3.1)
MCH: 29.4 pg (ref 26.6–33.0)
MCHC: 33.2 g/dL (ref 31.5–35.7)
MCV: 88 fL (ref 79–97)
Monocytes Absolute: 0.9 10*3/uL (ref 0.1–0.9)
Monocytes: 10 %
NEUTROS PCT: 65 %
Neutrophils Absolute: 5.7 10*3/uL (ref 1.4–7.0)
PLATELETS: 201 10*3/uL (ref 150–379)
RBC: 4.22 x10E6/uL (ref 4.14–5.80)
RDW: 13.5 % (ref 12.3–15.4)
WBC: 8.8 10*3/uL (ref 3.4–10.8)

## 2017-05-14 LAB — BASIC METABOLIC PANEL
BUN/Creatinine Ratio: 11 (ref 10–24)
BUN: 19 mg/dL (ref 8–27)
CALCIUM: 9.8 mg/dL (ref 8.6–10.2)
CO2: 24 mmol/L (ref 20–29)
Chloride: 97 mmol/L (ref 96–106)
Creatinine, Ser: 1.67 mg/dL — ABNORMAL HIGH (ref 0.76–1.27)
GFR calc Af Amer: 45 mL/min/{1.73_m2} — ABNORMAL LOW (ref >59)
GFR, EST NON AFRICAN AMERICAN: 39 mL/min/{1.73_m2} — AB (ref >59)
Glucose: 123 mg/dL — ABNORMAL HIGH (ref 65–99)
POTASSIUM: 4.6 mmol/L (ref 3.5–5.2)
Sodium: 137 mmol/L (ref 134–144)

## 2017-05-14 NOTE — Patient Instructions (Signed)
Medication Instructions:  Your physician recommends that you continue on your current medications as directed. Please refer to the Current Medication list given to you today. See catheterization instruction sheet   Labwork: Lab work to be done today--CBC, PT, BMP  Testing/Procedures: Your physician has requested that you have a cardiac catheterization. Cardiac catheterization is used to diagnose and/or treat various heart conditions. Doctors may recommend this procedure for a number of different reasons. The most common reason is to evaluate chest pain. Chest pain can be a symptom of coronary artery disease (CAD), and cardiac catheterization can show whether plaque is narrowing or blocking your heart's arteries. This procedure is also used to evaluate the valves, as well as measure the blood flow and oxygen levels in different parts of your heart. For further information please visit [email or web address]. Please follow instruction sheet, as given.  Scheduled for July 17,2018  Follow-Up: Your physician recommends that you schedule a follow-up appointment in: one month with PA or NP.     Any Other Special Instructions Will Be Listed Below (If Applicable).     If you need a refill on your cardiac medications before your next appointment, please call your pharmacy.

## 2017-05-14 NOTE — Addendum Note (Signed)
Addended by: Lauree Chandler D on: 05/14/2017 10:11 AM   Modules accepted: Orders, SmartSet

## 2017-05-17 ENCOUNTER — Telehealth: Payer: Self-pay

## 2017-05-17 NOTE — Telephone Encounter (Signed)
Patient contacted pre-catheterization at [facility] scheduled for:  05/18/2017 @ 1030 Verified arrival time and place:  NT @ 0800 Confirmed AM meds to be taken pre-cath with sip of water:  Informed Pt to take Plavix and a baby ASA prior to arrival.   Notified Pt to hold lasix tomorrow morning.  Pt states his last dose of Eliquis was Saturday.  Pt states not to take Metformin today or tomorrow morning. Confirmed patient has responsible person to drive home post procedure and observe patient for 24 hours: wife Addl concerns:  Pt states admissions has been calling him requesting a med list.  Notified Pt I would make outreach for him.

## 2017-05-17 NOTE — Telephone Encounter (Signed)
Call placed to Pt.  Per Pt, please call him back in 30 minutes.  Will call back.

## 2017-05-18 ENCOUNTER — Encounter (HOSPITAL_COMMUNITY): Payer: Self-pay | Admitting: Cardiology

## 2017-05-18 ENCOUNTER — Encounter (HOSPITAL_COMMUNITY)
Admission: RE | Disposition: A | Discharge status: Home / Self Care | Payer: Self-pay | Source: Ambulatory Visit | Attending: Cardiology

## 2017-05-18 ENCOUNTER — Ambulatory Visit (HOSPITAL_COMMUNITY)
Admission: RE | Admit: 2017-05-18 | Discharge: 2017-05-18 | Disposition: A | Discharge status: Home / Self Care | Payer: BLUE CROSS/BLUE SHIELD | Source: Ambulatory Visit | Attending: Cardiology | Admitting: Cardiology

## 2017-05-18 DIAGNOSIS — I2584 Coronary atherosclerosis due to calcified coronary lesion: Secondary | ICD-10-CM | POA: Insufficient documentation

## 2017-05-18 DIAGNOSIS — Z7984 Long term (current) use of oral hypoglycemic drugs: Secondary | ICD-10-CM | POA: Insufficient documentation

## 2017-05-18 DIAGNOSIS — I25119 Atherosclerotic heart disease of native coronary artery with unspecified angina pectoris: Secondary | ICD-10-CM

## 2017-05-18 DIAGNOSIS — I25110 Atherosclerotic heart disease of native coronary artery with unstable angina pectoris: Secondary | ICD-10-CM

## 2017-05-18 DIAGNOSIS — I5032 Chronic diastolic (congestive) heart failure: Secondary | ICD-10-CM | POA: Diagnosis present

## 2017-05-18 DIAGNOSIS — I208 Other forms of angina pectoris: Secondary | ICD-10-CM | POA: Diagnosis present

## 2017-05-18 DIAGNOSIS — I5042 Chronic combined systolic (congestive) and diastolic (congestive) heart failure: Secondary | ICD-10-CM | POA: Diagnosis not present

## 2017-05-18 DIAGNOSIS — Z955 Presence of coronary angioplasty implant and graft: Secondary | ICD-10-CM | POA: Diagnosis not present

## 2017-05-18 DIAGNOSIS — Z7901 Long term (current) use of anticoagulants: Secondary | ICD-10-CM | POA: Diagnosis not present

## 2017-05-18 DIAGNOSIS — E785 Hyperlipidemia, unspecified: Secondary | ICD-10-CM | POA: Insufficient documentation

## 2017-05-18 DIAGNOSIS — E119 Type 2 diabetes mellitus without complications: Secondary | ICD-10-CM | POA: Insufficient documentation

## 2017-05-18 DIAGNOSIS — Z87891 Personal history of nicotine dependence: Secondary | ICD-10-CM | POA: Diagnosis not present

## 2017-05-18 DIAGNOSIS — I129 Hypertensive chronic kidney disease with stage 1 through stage 4 chronic kidney disease, or unspecified chronic kidney disease: Secondary | ICD-10-CM | POA: Diagnosis present

## 2017-05-18 DIAGNOSIS — I2510 Atherosclerotic heart disease of native coronary artery without angina pectoris: Secondary | ICD-10-CM | POA: Diagnosis present

## 2017-05-18 DIAGNOSIS — Z7902 Long term (current) use of antithrombotics/antiplatelets: Secondary | ICD-10-CM | POA: Insufficient documentation

## 2017-05-18 DIAGNOSIS — I480 Paroxysmal atrial fibrillation: Secondary | ICD-10-CM | POA: Diagnosis not present

## 2017-05-18 DIAGNOSIS — I252 Old myocardial infarction: Secondary | ICD-10-CM | POA: Insufficient documentation

## 2017-05-18 DIAGNOSIS — I110 Hypertensive heart disease with heart failure: Secondary | ICD-10-CM | POA: Insufficient documentation

## 2017-05-18 DIAGNOSIS — I255 Ischemic cardiomyopathy: Secondary | ICD-10-CM | POA: Insufficient documentation

## 2017-05-18 DIAGNOSIS — I10 Essential (primary) hypertension: Secondary | ICD-10-CM | POA: Diagnosis present

## 2017-05-18 DIAGNOSIS — E1159 Type 2 diabetes mellitus with other circulatory complications: Secondary | ICD-10-CM | POA: Diagnosis present

## 2017-05-18 HISTORY — PX: LEFT HEART CATH AND CORONARY ANGIOGRAPHY: CATH118249

## 2017-05-18 LAB — GLUCOSE, CAPILLARY
GLUCOSE-CAPILLARY: 104 mg/dL — AB (ref 65–99)
GLUCOSE-CAPILLARY: 106 mg/dL — AB (ref 65–99)

## 2017-05-18 SURGERY — LEFT HEART CATH AND CORONARY ANGIOGRAPHY
Anesthesia: LOCAL

## 2017-05-18 MED ORDER — METFORMIN HCL 500 MG PO TABS
500.0000 mg | ORAL_TABLET | Freq: Every evening | ORAL | Status: DC
  Filled 2017-05-18: qty 1

## 2017-05-18 MED ORDER — SODIUM CHLORIDE 0.9 % IV SOLN: 250.0000 mL | INTRAVENOUS | Status: DC | PRN

## 2017-05-18 MED ORDER — IOPAMIDOL (ISOVUE-370) INJECTION 76%
INTRAVENOUS | Status: AC
  Filled 2017-05-18: qty 100

## 2017-05-18 MED ORDER — HEPARIN (PORCINE) IN NACL 2-0.9 UNIT/ML-% IJ SOLN
INTRAMUSCULAR | Status: AC
  Filled 2017-05-18: qty 1000

## 2017-05-18 MED ORDER — SODIUM CHLORIDE 0.9 % WEIGHT BASED INFUSION: 1.0000 mL/kg/h | INTRAVENOUS | Status: DC

## 2017-05-18 MED ORDER — SODIUM CHLORIDE 0.9 % IV SOLN
INTRAVENOUS | Status: AC
  Administered 2017-05-18: 08:00:00 via INTRAVENOUS

## 2017-05-18 MED ORDER — FENTANYL CITRATE (PF) 100 MCG/2ML IJ SOLN
INTRAMUSCULAR | Status: DC | PRN
  Administered 2017-05-18: 25 ug via INTRAVENOUS

## 2017-05-18 MED ORDER — VERAPAMIL HCL 2.5 MG/ML IV SOLN
INTRAVENOUS | Status: DC | PRN
  Administered 2017-05-18: 10:00:00 via INTRA_ARTERIAL

## 2017-05-18 MED ORDER — SODIUM CHLORIDE 0.9% FLUSH: 3.0000 mL | Freq: Two times a day (BID) | INTRAVENOUS | Status: DC

## 2017-05-18 MED ORDER — MIDAZOLAM HCL 2 MG/2ML IJ SOLN
INTRAMUSCULAR | Status: DC | PRN
  Administered 2017-05-18: 1 mg via INTRAVENOUS

## 2017-05-18 MED ORDER — HEPARIN SODIUM (PORCINE) 1000 UNIT/ML IJ SOLN
INTRAMUSCULAR | Status: DC | PRN
  Administered 2017-05-18: 4000 [IU] via INTRAVENOUS

## 2017-05-18 MED ORDER — VERAPAMIL HCL 2.5 MG/ML IV SOLN
INTRAVENOUS | Status: AC
  Filled 2017-05-18: qty 2

## 2017-05-18 MED ORDER — ASPIRIN 81 MG PO CHEW: 81.0000 mg | CHEWABLE_TABLET | ORAL | Status: DC

## 2017-05-18 MED ORDER — SODIUM CHLORIDE 0.9% FLUSH: 3.0000 mL | INTRAVENOUS | Status: DC | PRN

## 2017-05-18 MED ORDER — LIDOCAINE HCL (PF) 1 % IJ SOLN
INTRAMUSCULAR | Status: AC
  Filled 2017-05-18: qty 30

## 2017-05-18 MED ORDER — LIDOCAINE HCL (PF) 1 % IJ SOLN
INTRAMUSCULAR | Status: DC | PRN
  Administered 2017-05-18: 2 mL

## 2017-05-18 MED ORDER — FENTANYL CITRATE (PF) 100 MCG/2ML IJ SOLN
INTRAMUSCULAR | Status: AC
  Filled 2017-05-18: qty 2

## 2017-05-18 MED ORDER — MIDAZOLAM HCL 2 MG/2ML IJ SOLN
INTRAMUSCULAR | Status: AC
  Filled 2017-05-18: qty 2

## 2017-05-18 MED ORDER — IOPAMIDOL (ISOVUE-370) INJECTION 76%
INTRAVENOUS | Status: DC | PRN
  Administered 2017-05-18: 65 mL via INTRA_ARTERIAL

## 2017-05-18 MED ORDER — HEPARIN (PORCINE) IN NACL 2-0.9 UNIT/ML-% IJ SOLN
INTRAMUSCULAR | Status: AC | PRN
  Administered 2017-05-18: 1000 mL

## 2017-05-18 MED ORDER — HEPARIN SODIUM (PORCINE) 1000 UNIT/ML IJ SOLN
INTRAMUSCULAR | Status: AC
  Filled 2017-05-18: qty 1

## 2017-05-18 SURGICAL SUPPLY — 11 items
CATH 5FR JL3.5 JR4 ANG PIG MP (CATHETERS) ×2 IMPLANT
COVER PRB 48X5XTLSCP FOLD TPE (BAG) ×1 IMPLANT
COVER PROBE 5X48 (BAG) ×1
DEVICE RAD COMP TR BAND LRG (VASCULAR PRODUCTS) ×2 IMPLANT
GLIDESHEATH SLEND SS 6F .021 (SHEATH) ×2 IMPLANT
GUIDEWIRE INQWIRE 1.5J.035X260 (WIRE) ×1 IMPLANT
INQWIRE 1.5J .035X260CM (WIRE) ×2
KIT HEART LEFT (KITS) ×2 IMPLANT
PACK CARDIAC CATHETERIZATION (CUSTOM PROCEDURE TRAY) ×2 IMPLANT
TRANSDUCER W/STOPCOCK (MISCELLANEOUS) ×2 IMPLANT
TUBING CIL FLEX 10 FLL-RA (TUBING) ×2 IMPLANT

## 2017-05-18 NOTE — Interval H&P Note (Signed)
History and Physical Interval Note:  05/18/2017 9:39 AM  Gregory Bates  has presented today for surgery, with the diagnosis of cp - cad  The various methods of treatment have been discussed with the patient and family. After consideration of risks, benefits and other options for treatment, the patient has consented to  Procedure(s): Left Heart Cath and Coronary Angiography (N/A) as a surgical intervention .  The patient's history has been reviewed, patient examined, no change in status, stable for surgery.  I have reviewed the patient's chart and labs.  Questions were answered to the patient's satisfaction.   Cath Lab Visit (complete for each Cath Lab visit)  Clinical Evaluation Leading to the Procedure:   ACS: No.  Non-ACS:    Anginal Classification: CCS III  Anti-ischemic medical therapy: Maximal Therapy (2 or more classes of medications)  Non-Invasive Test Results: No non-invasive testing performed  Prior CABG: No previous CABG        [name] [facility] 05/18/2017 9:40 AM

## 2017-05-18 NOTE — Discharge Instructions (Signed)
NO METFORMIN/GLUCOPHAGE UNTIL Friday Evening at 6pm   Radial Site Care Refer to this sheet in the next few weeks. These instructions provide you with information about caring for yourself after your procedure. Your health care provider may also give you more specific instructions. Your treatment has been planned according to current medical practices, but problems sometimes occur. Call your health care provider if you have any problems or questions after your procedure. What can I expect after the procedure? After your procedure, it is typical to have the following:  Bruising at the radial site that usually fades within 1-2 weeks.  Blood collecting in the tissue (hematoma) that may be painful to the touch. It should usually decrease in size and tenderness within 1-2 weeks.  Follow these instructions at home:  Take medicines only as directed by your health care provider.  You may shower 24-48 hours after the procedure or as directed by your health care provider. Remove the bandage (dressing) and gently wash the site with plain soap and water. Pat the area dry with a clean towel. Do not rub the site, because this may cause bleeding.  Do not take baths, swim, or use a hot tub until your health care provider approves.  Check your insertion site every day for redness, swelling, or drainage.  Do not apply powder or lotion to the site.  Do not flex or bend the affected arm for 24 hours or as directed by your health care provider.  Do not push or pull heavy objects with the affected arm for 24 hours or as directed by your health care provider.  Do not lift over 10 lb (4.5 kg) for 5 days after your procedure or as directed by your health care provider.  Ask your health care provider when it is okay to: ? Return to work or school. ? Resume usual physical activities or sports. ? Resume sexual activity.  Do not drive home if you are discharged the same day as the procedure. Have someone else  drive you.  You may drive 24 hours after the procedure unless otherwise instructed by your health care provider.  Do not operate machinery or power tools for 24 hours after the procedure.  If your procedure was done as an outpatient procedure, which means that you went home the same day as your procedure, a responsible adult should be with you for the first 24 hours after you arrive home.  Keep all follow-up visits as directed by your health care provider. This is important. Contact a health care provider if:  You have a fever.  You have chills.  You have increased bleeding from the radial site. Hold pressure on the site. Get help right away if:  You have unusual pain at the radial site.  You have redness, warmth, or swelling at the radial site.  You have drainage (other than a small amount of blood on the dressing) from the radial site.  The radial site is bleeding, and the bleeding does not stop after 30 minutes of holding steady pressure on the site.  Your arm or hand becomes pale, cool, tingly, or numb. This information is not intended to replace advice given to you by your health care provider. Make sure you discuss any questions you have with your health care provider. Document Released: 11/21/2010 Document Revised: 03/26/2016 Document Reviewed: 05/07/2014 Elsevier Interactive Patient Education  2018 [employer].

## 2017-05-18 NOTE — H&P (View-Only) (Signed)
Chief Complaint  Patient presents with  . Chest Pain     History of Present Illness: 76 yo WM with history of DM, HTN, hyperlipidemia, atrial flutter and CAD here today for cardiac follow up. He has CAD and was admitted for the first time in 2013 with an inferior STEMI and the occluded RCA was treated with a DES. He was readmitted 02/17/13 with a NSTEMI and found to have a severe stenosis in the OM branch treated with a DES. He was admitted to [facility] January 2014 with SOB and chest pain and was found to be in atrial flutter with RVR. He converted to sinus on diltiazem drip. Echo 11/05/12 with normal LV size and function. He was started on Eliquis before discharge. Repeat cardiac cath in setting of chest pain in February 2015 and found to have high grade stenosis in the distal RCA treated with a DES. He had several admissions in 2015 with atrial fib with RVR. He developed a rash on Cardizem. He has been managed with amiodarone and metoprolol and anti-coagulated with Eliquis. Repeat cardiac cath in January 2016 with stable CAD. Stress myoview 2017 with no ischemia. Echo February 2017 with LVEF=45-50%, mild AI. Mild MR. He was seen in the ED June 2018 with chest pain and troponin was negative. He was in sinus brady. Admission was recommended but he left the ED against medical advice. He was seen in our office 04/20/17 by Robbie Lis, PA-C and reported improvement in chest pain. Imdur was increased to 60 mg daily. Metoprolol reduced to 25 mg po BID due to bradycardia and dizziness.   He is here today for follow up. He c/o left sided chest pain as well as central pain for the last several weeks. This is exactly like his prior angina pain but perhaps slightly less intense. He is overall feeling well. He is having chest pains about every 2 hours that last for several seconds to several minutes. He is having no chest pain currently. He denies any dyspnea, palpitations, lower extremity edema, orthopnea, PND, dizziness,  near syncope or syncope.    Primary Care Physician: Cyndy Freeze, MD Urology: Joie Bimler, MD   Past Medical History:  Diagnosis Date  . Acute renal insufficiency    a. During 11/2013 adm with atrial flutter.  . Anemia, unspecified   . CAD (coronary artery disease), native coronary artery    a. s/p cath May 2011 >> DES distal RCA;  b. 01/2013 NSTEMI >> OM1 99p (2.75x12 Promus Premier DES);  c. LHC (2/15): PCI: Promus DES to dist RCA ;  d. LHC 1/16 - dLM 11, oLAD 30, small D1 tandem 23, pOM1 stent ok, mOM 50-60, dOM bifurcation 50-60 in both vessels, AV 6-70 jailed by stent, pRCA 31 mRCA stent ok, dRCA stent ok, PLB smal 50, dPDA 60, 80; EF 35-40 >> med Rx - PCI of PDA if angina  . Chronic combined systolic and diastolic CHF (congestive heart failure) ([place]) 12/23/2015   Echo 2/17: Mild LVH, EF 45-50%, inferior akinesis, grade 2 diastolic dysfunction, mild AI, mild MR, mild LAE, PASP 44 mmHg   . Colorectal polyps 2002  . Diabetes mellitus    AODM  . Erectile dysfunction   . History of echocardiogram    a. 01/2013 Echo: EF 55%, mid-dist inflat HK, Gr1 DD, Triv AI/TR, Mild MR.;  b.  Echo (1/15): Mild LVH, EF 55%, inferolateral hypokinesis, grade 1 diastolic dysfunction, mild AI, trivial MR, moderate LAE, PASP 36 mmHg   .  Hx of cardiovascular stress test    Stress myoview 08/07/13 with LVEF 44%, inferior scar, no ischemia  . Hyperlipidemia   . Hypertension   . Ischemic cardiomyopathy    a. EF 35-40% by LHC in 1/16; b. Echo 2/17: Mild LVH, EF 45-50%, inferior akinesis, grade 2 diastolic dysfunction, mild AI, mild MR, mild LAE, PASP 44 mmHg  . Myocardial infarction ([place])   . PAF (paroxysmal atrial fibrillation) ([place])    a. Dx 11/2013. Spont conv. Placed on eliquis.    Past Surgical History:  Procedure Laterality Date  . COLONOSCOPY W/ POLYPECTOMY  2002   [facility]  . COLONOSCOPY W/ POLYPECTOMY  2004; 2007 negative  . CORONARY ANGIOPLASTY  12/26/2013  . CORONARY STENT PLACEMENT  2007,  2011  . infantile paralysis facial asymmetry    . LEFT HEART CATHETERIZATION WITH CORONARY ANGIOGRAM N/A 10/02/2012   Procedure: LEFT HEART CATHETERIZATION WITH CORONARY ANGIOGRAM;  Surgeon: Peter M Martinique, MD;  Location: [facility] CATH LAB;  Service: Cardiovascular;  Laterality: N/A;  . LEFT HEART CATHETERIZATION WITH CORONARY ANGIOGRAM N/A 02/20/2013   Procedure: LEFT HEART CATHETERIZATION WITH CORONARY ANGIOGRAM;  Surgeon: Burnell Blanks, MD;  Location: [facility] CATH LAB;  Service: Cardiovascular;  Laterality: N/A;  . LEFT HEART CATHETERIZATION WITH CORONARY ANGIOGRAM N/A 12/26/2013   Procedure: LEFT HEART CATHETERIZATION WITH CORONARY ANGIOGRAM;  Surgeon: Burnell Blanks, MD;  Location: [facility] CATH LAB;  Service: Cardiovascular;  Laterality: N/A;  . LEFT HEART CATHETERIZATION WITH CORONARY ANGIOGRAM N/A 11/28/2014   Procedure: LEFT HEART CATHETERIZATION WITH CORONARY ANGIOGRAM;  Surgeon: Burnell Blanks, MD;  Location: [facility] CATH LAB;  Service: Cardiovascular;  Laterality: N/A;  . PERCUTANEOUS CORONARY STENT INTERVENTION (PCI-S)  10/02/2012   Procedure: PERCUTANEOUS CORONARY STENT INTERVENTION (PCI-S);  Surgeon: Peter M Martinique, MD;  Location: [facility] CATH LAB;  Service: Cardiovascular;;  . PERCUTANEOUS CORONARY STENT INTERVENTION (PCI-S)  02/20/2013   Procedure: PERCUTANEOUS CORONARY STENT INTERVENTION (PCI-S);  Surgeon: Burnell Blanks, MD;  Location: [facility] CATH LAB;  Service: Cardiovascular;;  . PERCUTANEOUS CORONARY STENT INTERVENTION (PCI-S)  12/26/2013   Procedure: PERCUTANEOUS CORONARY STENT INTERVENTION (PCI-S);  Surgeon: Burnell Blanks, MD;  Location: [facility] CATH LAB;  Service: Cardiovascular;;  . WISDOM TOOTH EXTRACTION      Current Outpatient Prescriptions  Medication Sig Dispense Refill  . amiodarone (PACERONE) 200 MG tablet Take 1 tablet (200 mg total) by mouth daily. 90 tablet 2  . amLODipine (NORVASC) 5 MG tablet Take 1 tablet (5 mg total) by mouth daily. 90 tablet 1  .  apixaban (ELIQUIS) 5 MG TABS tablet Take 5 mg by mouth 2 (two) times daily.    [name] Kitchen atorvastatin (LIPITOR) 20 MG tablet Take 20 mg by mouth at bedtime.    . Chelated Potassium 99 MG TABS Take 99 mg by mouth daily as needed (supplementation).    . cholecalciferol (VITAMIN D) 1000 UNITS tablet Take 1,000 Units by mouth every evening.     . clopidogrel (PLAVIX) 75 MG tablet Take 75 mg by mouth daily.    [name] Kitchen ELIQUIS 5 MG TABS tablet TAKE ONE TABLET BY MOUTH TWICE DAILY 180 tablet 1  . fexofenadine (ALLEGRA) 180 MG tablet Take 180 mg by mouth daily as needed for allergies.     . furosemide (LASIX) 20 MG tablet Take 20 mg by mouth daily.    . isosorbide mononitrate (IMDUR) 60 MG 24 hr tablet Take 1 tablet (60 mg total) by mouth daily. 30 tablet 6  . lisinopril (PRINIVIL,ZESTRIL) 20 MG tablet  Take 1 tablet (20 mg total) by mouth daily. 90 tablet 2  . metFORMIN (GLUCOPHAGE) 500 MG tablet Take 500 mg by mouth at bedtime.    . metoprolol tartrate (LOPRESSOR) 25 MG tablet Take 1 tablet (25 mg total) by mouth 2 (two) times daily. 60 tablet 6  . Multiple Vitamin (MULTIVITAMIN) tablet Take 1 tablet by mouth daily.      . nitroGLYCERIN (NITROSTAT) 0.4 MG SL tablet Place 0.4 mg under the tongue every 5 (five) minutes as needed for chest pain.    . vitamin E 100 UNIT capsule Take 100 Units by mouth daily.     No current facility-administered medications for this visit.     Allergies  Allergen Reactions  . Brilinta [Ticagrelor] Other (See Comments)    Arthralgias & myalgias  . Crestor [Rosuvastatin] Other (See Comments)    Myalgias    Social History   Social History  . Marital status: Married    Spouse name: N/A  . Number of children: 1  . Years of education: N/A   Occupational History  . maintenance     full time   Social History Main Topics  . Smoking status: Former Smoker    Quit date: 11/02/1984  . Smokeless tobacco: Never Used     Comment: smoked [place], up to 3 ppd (!)  . Alcohol use No    . Drug use: No  . Sexual activity: Not on file   Other Topics Concern  . Not on file   Social History Narrative  . No narrative on file    Family History  Problem Relation Age of Onset  . Heart attack Brother 68  . Colon cancer Brother   . Diabetes Neg Hx   . Stroke Neg Hx   . Hypertension Neg Hx     Review of Systems:  As stated in the HPI and otherwise negative.   BP 102/64   Pulse 61   Ht 5\' 11"  (1.803 m)   Wt 186 lb (84.4 kg)   SpO2 97%   BMI 25.94 kg/m   Physical Examination:  General: Well developed, well nourished, NAD  HEENT: OP clear, mucus membranes moist  SKIN: warm, dry. No rashes. Neuro: No focal deficits  Musculoskeletal: Muscle strength 5/5 all ext  Psychiatric: Mood and affect normal  Neck: No JVD, no carotid bruits, no thyromegaly, no lymphadenopathy.  Lungs:Clear bilaterally, no wheezes, rhonci, crackles Cardiovascular: Regular, brady. No murmurs, gallops or rubs. Abdomen:Soft. Bowel sounds present. Non-tender.  Extremities: No lower extremity edema. Pulses are 2 + in the bilateral DP/PT.  Cardiac cath 11/28/14: Left main: Distal 20% stenosis.  Left Anterior Descending Artery: Moderate caliber vessel that does not reach the apex. The ostium has a 30% stenosis. The mid vessel has diffuse plaque. The first diagonal branch is small to moderate in caliber (2.0 mm) and has tandem 70% stenoses which are unchanged from the last cath.  Circumflex Artery: Moderate caliber vessel with moderate caliber first obtuse marginal branch. The first OM branch arises early and has a patent stent in the proximal segment with no restenosis. The mid segment of the OM branch has a 50-60% stenosis. Distally the OM branch bifurcates and there is complex 50-60% stenosis involving both vessels beyond the branch point, non-flow limiting and unchanged from last cath. The AV groove Circumflex has 60-70% stenosis just after the takeoff of the OM branch as it is jailed by the OM  stent, unchanged from last cath.  Right Coronary  Artery: Large dominant vessel with severe calcification in the proximal and mid vessel. The proximal vessel has a 40% stenosis. The mid vessel has a patent stent with no restenosis. The distal vessel has a long stented segment that is patent without restenosis. The posterolateral branch is small in caliber and has a 50% stenosis. The PDA is small to moderate in caliber (2.25 mm vessel) and in the distal segment there is a 60% stenosis followed by a 80% stenosis which is unchanged from the last cath.  Left Ventricular Angiogram: LVEF 35-40% with inferior wall hypokinesis.    Echo 12/23/15: Left ventricle: The cavity size was normal. There was mild  concentric hypertrophy. Systolic function was mildly reduced. The  estimated ejection fraction was in the range of 45% to 50%.  Akinesis and scarring of the basal-midinferior myocardium;  consistent with infarction in the distribution of the right  coronary artery. Features are consistent with a pseudonormal left  ventricular filling pattern, with concomitant abnormal relaxation  and increased filling pressure (grade 2 diastolic dysfunction). - Aortic valve: There was mild regurgitation. - Mitral valve: There was mild regurgitation directed centrally. - Left atrium: The atrium was mildly dilated. - Pulmonary arteries: Systolic pressure was mildly increased. PA  peak pressure: 44 mm Hg (S).  EKG:  EKG is ordered today. The ekg ordered today demonstrates Sinus brady, rate 58 bpm. Old small inferior Q waves. Diffuse T wave flattening, Unchanged.   Recent Labs: 10/02/2016: ALT 28; TSH 0.75 04/17/2017: B Natriuretic Peptide 393.9; BUN 21; Creatinine, Ser 1.74; Hemoglobin 11.5; Platelets 185; Potassium 4.0; Sodium 135      Wt Readings from Last 3 Encounters:  05/14/17 186 lb (84.4 kg)  04/20/17 194 lb 3.2 oz (88.1 kg)  04/17/17 190 lb (86.2 kg)     Other studies Reviewed: Additional  studies/ records that were reviewed today include: . Review of the above records demonstrates:    Assessment and Plan:   1. CAD with unstable angina: He continues to have chest pain despite elevated dose of Imdur. I am concerned that he has progression of his CAD. He has no chest pain this am. His EKG shows no ischemic changes.  Will continue Imdur, Plavix, statin, beta blocker. No ASA with current use of Eliquis  Will plan cardiac cath [facility]/18/17 at [facility] with Dr. Martinique. I will be out on vacation next week. Risks and benefits of cath reviewed with pt. He agrees to proceed. Will hold Eliquis 48 hours before cath. Pre-cath labs today  2. HTN: BP is controlled. No changes  3. Hyperlipidemia: Lipids well controlled in primary care. Continue statin.   4. Atrial fibrillation/flutter, paroxysmal: Sinus today. Continue amiodarone and metoprolol. Continue Eliquis but hold for cath  5. Chronic diastolic CHF: Last echo February 2017 with WCHE=52-77%, grade 2 diastolic dysfunction. Volume status is ok on daily Lasix.   Current medicines are reviewed at length with the patient today.  The patient does not have concerns regarding medicines.  The following changes have been made:  no change  Labs/ tests ordered today include:   Orders Placed This Encounter  Procedures  . Basic Metabolic Panel (BMET)  . CBC w/Diff  . INR/PT  . EKG 12-Lead    Disposition:   FU in our office post cath.   Signed, Lauree Chandler, MD 05/14/2017 10:11 AM    [facility] [address], [place], [place]  [address] Phone: [phone]; Fax: [phone]

## 2017-06-01 ENCOUNTER — Other Ambulatory Visit: Payer: Self-pay | Admitting: Cardiovascular Disease

## 2017-06-21 ENCOUNTER — Encounter: Payer: Self-pay | Admitting: Physician Assistant

## 2017-06-21 ENCOUNTER — Ambulatory Visit (INDEPENDENT_AMBULATORY_CARE_PROVIDER_SITE_OTHER): Payer: BLUE CROSS/BLUE SHIELD | Admitting: Physician Assistant

## 2017-06-21 VITALS — BP 110/50 | HR 60 | Ht 71.0 in | Wt 188.8 lb

## 2017-06-21 DIAGNOSIS — I10 Essential (primary) hypertension: Secondary | ICD-10-CM

## 2017-06-21 DIAGNOSIS — I25119 Atherosclerotic heart disease of native coronary artery with unspecified angina pectoris: Secondary | ICD-10-CM

## 2017-06-21 DIAGNOSIS — I5042 Chronic combined systolic (congestive) and diastolic (congestive) heart failure: Secondary | ICD-10-CM

## 2017-06-21 DIAGNOSIS — I480 Paroxysmal atrial fibrillation: Secondary | ICD-10-CM | POA: Diagnosis not present

## 2017-06-21 MED ORDER — CLOPIDOGREL BISULFATE 75 MG PO TABS: 75.0000 mg | ORAL_TABLET | Freq: Every day | ORAL | 3 refills | Status: DC

## 2017-06-21 NOTE — Patient Instructions (Addendum)
Medication Instructions:  You can try taking the Furosemide (Lasix) at different times of day or changing it to one tablet every other day to see if it cuts down on the nighttime urination.  If it does not, follow up with your primary care doctor to see if you need medications to help with your prostate.  If you gain 3 lbs in 1 day or 5 lbs in 1 week or notice swelling or increased shortness of breath, go back to taking Lasix every day.   If you feel that you would like to change the medications that help with chest pain, let me know and we can make some adjustments.  Labwork: None   Testing/Procedures: None   Follow-Up: Dr. Lauree Chandler in 3 months. 10/08/17 @ 8:20  Any Other Special Instructions Will Be Listed Below (If Applicable).  If you need a refill on your cardiac medications before your next appointment, please call your pharmacy.

## 2017-06-21 NOTE — Progress Notes (Signed)
Cardiology Office Note:    Date:  06/21/2017   ID:  Gregory Bates, DOB 06/21/1941, MRN [id number]  PCP:  Cyndy Freeze, MD  Cardiologist:  Dr. Lauree Chandler    Referring MD: Cyndy Freeze, MD   Chief Complaint  Patient presents with  . Coronary Artery Disease    follow up    History of Present Illness:    Gregory Bates is a 76 y.o. male with a hx of CAD s/p inferior STEMI in 12/13 treated with a DES to the RCA, non-STEMI in 4/14 treated with a DES to the OM1, and unstable angina in 2/15 treated with a DES to the distal RCA. Other history includes DM2, HTN, HL, paroxysmal atrial fibrillation/flutter. He is on a apixaban for anticoagulation. He had several episodes of AF with RVR in 2016. He was ultimately placed on amiodarone. Myoview in 2/17 demonstrated inferior scar but no ischemia.  EF was 39 but Echo demonstrated EF 45-50%.  Med Rx was continued.  He was seen in the emergency room in 6/18 with chest pain. Admission was recommended that he left against medical advise. He was then seen by Dr. Angelena Form 05/14/17. He continued to have chest discomfort despite increased dose of nitrates. Cardiac catheterization was recommended. This demonstrated 3 vessel CAD with patent stent in the OM1 and patent stents in the mid and distal RCA. There was moderate disease in the LAD and circumflex. There was high-grade stenosis in the RPDA but this was not favorable for PCI due to severe calcification, tortuosity and multiple prior stents in the RCA. Overall, there was no significant change when compared to 2016. Medical therapy was recommended.  [name] returns for cardiology follow-up. He is here alone.  He is doing well.  He has chest pain with only heavy exertion.  He denies chest pain with moderate activities. He denies significant shortness of breath.  He denies bleeding issues.  He denies paroxysmal nocturnal dyspnea, edema, syncope.    Prior CV studies:   The following studies were reviewed  today:  Cardiac Catheterization 05/18/17 LM 40 LAD(Relatively small) ost 50, D1 70 LCx prox 80, OM1 ost stent patent with 10 ISR, mid 75 RCA prox 60, mid and distal stents patent, RPDA 90 EF 45-50 Plan: compared to prior cath in 2016 there is no significant change. He has 3 vessel diffuse disease without clear focal lesion suitable for PCI. PCI of the PDA would be very challenging due to severe calcification, tortuosity and multiple prior stents in RCA. Will discuss with Dr. Angelena Form.  Echo 12/23/15 Mild concentric LVH, EF 45-50, inferior akinesis, grade 2 diastolic dysfunction, mild AI, mild MR, mild LAE, PASP 44   Myoview 2/17 EF 39%, inferior scar, no ischemia, intermediate risk   LHC 11/28/14 Left main: Distal 20% stenosis.  Left Anterior Descending Artery:  Ostial 30%, small to moderate D1 with tandem 70%  Circumflex Artery: OM1 proximal stent patent, mid OM 50-60%, distal OM bifurcation complex 50-60% involving both vessels at branch point, AV groove 60-70%-jailed by OM stent  Right Coronary Artery: Proximal 40%, mid stent patent, distal stent patent, PLB small-caliber 50%, PDA small to moderate distal 60%, 80%  Left Ventricular Angiogram: LVEF 35-40% with inferior wall hypokinesis.  Impression: 1. Triple vessel CAD 2. Patent stents in the RCA and OM 3. Moderate disease in the PDA and the OM1 4. Moderate LV systolic dysfunction Recommendations: Will continue medical management of CAD.    Echo (1/15):  Mild LVH, EF 55%, inferolateral  hypokinesis, grade 1 diastolic dysfunction, mild AI, trivial MR, moderate LAE, PASP 36 mmHg    LHC (2/15):  dLM 20, oLAD 30, D1 with tandem 70, pOM1 stent ok w/ mid 50, both vessels dOM bifurc 50, AVCFX 60-70, pRCA 40, mRCA stent ok, dRCA 99 on prox edge of old stent, PL Br 50, PDA Br 60 followed by 80, EF 40% with inf HK >> PCI:  Promus DES to dist RCA   Stress myoview 08/07/13 LVEF 44%, inferior scar, no ischemia  Past Medical History:    Diagnosis Date  . Acute renal insufficiency    a. During 11/2013 adm with atrial flutter.  . Anemia, unspecified   . CAD (coronary artery disease), native coronary artery    a. s/p cath May 2011 >> DES distal RCA;  b. 01/2013 NSTEMI >> OM1 99p (2.75x12 Promus Premier DES);  c. LHC (2/15): PCI: Promus DES to dist RCA ;  d. LHC 1/16 - dLM 16, oLAD 30, small D1 tandem 43, pOM1 stent ok, mOM 50-60, dOM bifurcation 50-60 in both vessels, AV 92-70 jailed by stent, pRCA 55 mRCA stent ok, dRCA stent ok, PLB smal 50, dPDA 60, 80; EF 35-40 >> med Rx - PCI of PDA if angina  . Chronic combined systolic and diastolic CHF (congestive heart failure) ([place]) 12/23/2015   Echo 2/17: Mild LVH, EF 45-50%, inferior akinesis, grade 2 diastolic dysfunction, mild AI, mild MR, mild LAE, PASP 44 mmHg   . Colorectal polyps 2002  . Diabetes mellitus    AODM  . Erectile dysfunction   . History of echocardiogram    a. 01/2013 Echo: EF 55%, mid-dist inflat HK, Gr1 DD, Triv AI/TR, Mild MR.;  b.  Echo (1/15): Mild LVH, EF 55%, inferolateral hypokinesis, grade 1 diastolic dysfunction, mild AI, trivial MR, moderate LAE, PASP 36 mmHg   . Hx of cardiovascular stress test    Stress myoview 08/07/13 with LVEF 44%, inferior scar, no ischemia  . Hyperlipidemia   . Hypertension   . Ischemic cardiomyopathy    a. EF 35-40% by LHC in 1/16; b. Echo 2/17: Mild LVH, EF 45-50%, inferior akinesis, grade 2 diastolic dysfunction, mild AI, mild MR, mild LAE, PASP 44 mmHg  . Myocardial infarction ([place])   . PAF (paroxysmal atrial fibrillation) ([place])    a. Dx 11/2013. Spont conv. Placed on eliquis.    Past Surgical History:  Procedure Laterality Date  . COLONOSCOPY W/ POLYPECTOMY  2002   [facility]  . COLONOSCOPY W/ POLYPECTOMY  2004; 2007 negative  . CORONARY ANGIOPLASTY  12/26/2013  . CORONARY STENT PLACEMENT  2007, 2011  . infantile paralysis facial asymmetry    . LEFT HEART CATH AND CORONARY ANGIOGRAPHY N/A 05/18/2017   Procedure: Left  Heart Cath and Coronary Angiography;  Surgeon: Martinique, Peter M, MD;  Location: [place] [facility];  Service: Cardiovascular;  Laterality: N/A;  . LEFT HEART CATHETERIZATION WITH CORONARY ANGIOGRAM N/A 10/02/2012   Procedure: LEFT HEART CATHETERIZATION WITH CORONARY ANGIOGRAM;  Surgeon: Peter M Martinique, MD;  Location: [facility] CATH LAB;  Service: Cardiovascular;  Laterality: N/A;  . LEFT HEART CATHETERIZATION WITH CORONARY ANGIOGRAM N/A 02/20/2013   Procedure: LEFT HEART CATHETERIZATION WITH CORONARY ANGIOGRAM;  Surgeon: Burnell Blanks, MD;  Location: [facility] CATH LAB;  Service: Cardiovascular;  Laterality: N/A;  . LEFT HEART CATHETERIZATION WITH CORONARY ANGIOGRAM N/A 12/26/2013   Procedure: LEFT HEART CATHETERIZATION WITH CORONARY ANGIOGRAM;  Surgeon: Burnell Blanks, MD;  Location: [facility] CATH LAB;  Service: Cardiovascular;  Laterality:  N/A;  . LEFT HEART CATHETERIZATION WITH CORONARY ANGIOGRAM N/A 11/28/2014   Procedure: LEFT HEART CATHETERIZATION WITH CORONARY ANGIOGRAM;  Surgeon: Burnell Blanks, MD;  Location: [facility] CATH LAB;  Service: Cardiovascular;  Laterality: N/A;  . PERCUTANEOUS CORONARY STENT INTERVENTION (PCI-S)  10/02/2012   Procedure: PERCUTANEOUS CORONARY STENT INTERVENTION (PCI-S);  Surgeon: Peter M Martinique, MD;  Location: [facility] CATH LAB;  Service: Cardiovascular;;  . PERCUTANEOUS CORONARY STENT INTERVENTION (PCI-S)  02/20/2013   Procedure: PERCUTANEOUS CORONARY STENT INTERVENTION (PCI-S);  Surgeon: Burnell Blanks, MD;  Location: [facility] CATH LAB;  Service: Cardiovascular;;  . PERCUTANEOUS CORONARY STENT INTERVENTION (PCI-S)  12/26/2013   Procedure: PERCUTANEOUS CORONARY STENT INTERVENTION (PCI-S);  Surgeon: Burnell Blanks, MD;  Location: [facility] CATH LAB;  Service: Cardiovascular;;  . WISDOM TOOTH EXTRACTION      Current Medications: Current Meds  Medication Sig  . amiodarone (PACERONE) 200 MG tablet Take 1 tablet (200 mg total) by mouth daily.  [name] Kitchen amLODipine (NORVASC) 5 MG  tablet Take 1 tablet (5 mg total) by mouth daily.  [name] Kitchen atorvastatin (LIPITOR) 20 MG tablet Take 20 mg by mouth at bedtime.  . Chelated Potassium 99 MG TABS Take 99 mg by mouth daily as needed (supplementation).  . cholecalciferol (VITAMIN D) 1000 UNITS tablet Take 1,000 Units by mouth every evening.   [name] Kitchen ELIQUIS 5 MG TABS tablet TAKE ONE TABLET BY MOUTH TWICE DAILY  . fexofenadine (ALLEGRA) 180 MG tablet Take 180 mg by mouth daily as needed for allergies.   . furosemide (LASIX) 20 MG tablet Take 20 mg by mouth daily.  . isosorbide mononitrate (IMDUR) 60 MG 24 hr tablet Take 1 tablet (60 mg total) by mouth daily.  [name] Kitchen lisinopril (PRINIVIL,ZESTRIL) 20 MG tablet TAKE 1 TABLET EVERY DAY  . metoprolol tartrate (LOPRESSOR) 25 MG tablet Take 1 tablet (25 mg total) by mouth 2 (two) times daily.  . Multiple Vitamin (MULTIVITAMIN) tablet Take 1 tablet by mouth at bedtime.   . nitroGLYCERIN (NITROSTAT) 0.4 MG SL tablet Place 0.4 mg under the tongue every 5 (five) minutes as needed for chest pain.  . vitamin E 100 UNIT capsule Take 100 Units by mouth at bedtime.   . [DISCONTINUED] clopidogrel (PLAVIX) 75 MG tablet Take 75 mg by mouth daily.     Allergies:   Brilinta [ticagrelor] and Crestor [rosuvastatin]   Social History   Social History  . Marital status: Married    Spouse name: N/A  . Number of children: 1  . Years of education: N/A   Occupational History  . maintenance     full time   Social History Main Topics  . Smoking status: Former Smoker    Quit date: 11/02/1984  . Smokeless tobacco: Never Used     Comment: smoked [place], up to 3 ppd (!)  . Alcohol use No  . Drug use: No  . Sexual activity: Not Asked   Other Topics Concern  . 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 There is no history of Diabetes, Stroke, or  Hypertension.  ROS:   Please see the history of present illness.    Review of Systems  Cardiovascular: Positive for chest pain.  Musculoskeletal: Positive for back pain.   All other systems reviewed and are negative.   EKGs/Labs/Other Test Reviewed:    EKG:  EKG is  ordered today.  The ekg ordered today demonstrates NSR, HR 59, normal axis, nonspecific ST-T wave changes, QTc 437 ms, no change in prior tracing  Recent Labs: 10/02/2016: ALT 28; TSH 0.75 04/17/2017: B Natriuretic Peptide 393.9 05/14/2017: BUN 19; Creatinine, Ser 1.67; Hemoglobin 12.4; Platelets 201; Potassium 4.6; Sodium 137   Recent Lipid Panel Lab Results  Component Value Date/Time   CHOL 170 11/04/2013 06:17 AM   TRIG 166 (H) 11/04/2013 06:17 AM   HDL 34 (L) 11/04/2013 06:17 AM   CHOLHDL 5.0 11/04/2013 06:17 AM   LDLCALC 103 (H) 11/04/2013 06:17 AM   LDLDIRECT 69.3 11/26/2008 12:56 PM    Physical Exam:    VS:  BP (!) 110/50   Pulse 60   Ht 5\' 11"  (1.803 m)   Wt 188 lb 12.8 oz (85.6 kg)   BMI 26.33 kg/m     Wt Readings from Last 3 Encounters:  06/21/17 188 lb 12.8 oz (85.6 kg)  05/18/17 184 lb (83.5 kg)  05/14/17 186 lb (84.4 kg)     Physical Exam  Constitutional: He is oriented to person, place, and time. He appears well-developed and well-nourished. No distress.  HENT:  Head: Normocephalic and atraumatic.  Eyes: No scleral icterus.  Neck: No JVD present.  Cardiovascular: Normal rate and regular rhythm.   No murmur heard. Pulmonary/Chest: He has no rales.  Abdominal: Soft.  Musculoskeletal: He exhibits no edema or deformity (R wrist without hematoma ).  Neurological: He is alert and oriented to person, place, and time.  Skin: Skin is warm and dry.  Psychiatric: He has a normal mood and affect.    ASSESSMENT:    1. Coronary artery disease involving native coronary artery of native heart with angina pectoris ([place])   2. Chronic combined systolic and diastolic heart failure (HCC)   3.  Paroxysmal atrial fibrillation ([place])   4. Essential hypertension    PLAN:    In order of problems listed above:  1. Coronary artery disease with angina pectoris [facility]) -  S/p Inf STEMI tx with DES to RCA in 2013, NSTEMI in 2014 tx with DES to OM1 and PCI to RCA with a DES in 2/15.  He recently underwent Cardiac Catheterization that demonstrated stable anatomy.  He does have high grade RPDA stenosis but this would be a complex PCI procedure and medical Rx is favored.  He is doing better on higher dose nitrates.  He has CCS class I angina.  Continue current regimen.  If he has more frequent symptoms, we could cut his ACE inhibitor dose in 1/2 and increase his Imdur further.   -  Continue Lipitor 20, Plavix 75, Imdur 60, Lopressor 25 bid  2. Chronic combined systolic and diastolic heart failure (HCC) Volume is stable.  He is NYHA 2.  He has nocturia that he attributes to Lasix.  We discussed changing the timing of Lasix or decreasing it to every other day.  However, I suspect he may have BPH. If changing the Lasix does not help, he should follow up with his PCP.  3. Paroxysmal atrial fibrillation ([place]) - Plan: EKG 12-Lead Maintaining NSR.  Continue Eliquis.  Continue Amiodarone.  ALT and TSH in 4/18 were ok.  4. Essential hypertension The patient's blood pressure is controlled on his current regimen.  Continue current therapy.      Dispo:  Return in about 3 months (around Apr 26, 202018) for Routine Follow Up w/ Dr. Angelena Form.   Medication Adjustments/Labs and Tests Ordered: Current  medicines are reviewed at length with the patient today.  Concerns regarding medicines are outlined above.  Tests Ordered: Orders Placed This Encounter  Procedures  . EKG 12-Lead   Medication Changes: Meds ordered this encounter  Medications  . clopidogrel (PLAVIX) 75 MG tablet    Sig: Take 1 tablet (75 mg total) by mouth daily.    Dispense:  90 tablet    Refill:  3    Reorder    Order Specific Question:    Supervising Provider    Answer:   Deetta Perla    Signed, Richardson Dopp, PA-C  06/21/2017 9:42 AM    [facility] [place], [place], [place]  [address] Phone: [phone]; Fax: [phone]

## 2017-08-20 ENCOUNTER — Other Ambulatory Visit: Payer: Self-pay | Admitting: Physician Assistant

## 2017-08-20 DIAGNOSIS — I10 Essential (primary) hypertension: Secondary | ICD-10-CM

## 2017-08-20 DIAGNOSIS — I5022 Chronic systolic (congestive) heart failure: Secondary | ICD-10-CM

## 2017-09-20 DIAGNOSIS — Z6826 Body mass index (BMI) 26.0-26.9, adult: Secondary | ICD-10-CM | POA: Diagnosis not present

## 2017-09-20 DIAGNOSIS — B9681 Helicobacter pylori [H. pylori] as the cause of diseases classified elsewhere: Secondary | ICD-10-CM | POA: Diagnosis not present

## 2017-09-20 DIAGNOSIS — K2970 Gastritis, unspecified, without bleeding: Secondary | ICD-10-CM | POA: Diagnosis not present

## 2017-10-08 ENCOUNTER — Encounter: Payer: Self-pay | Admitting: Cardiovascular Disease

## 2017-10-08 ENCOUNTER — Telehealth: Payer: Self-pay | Admitting: Student

## 2017-10-08 ENCOUNTER — Ambulatory Visit (INDEPENDENT_AMBULATORY_CARE_PROVIDER_SITE_OTHER): Payer: BLUE CROSS/BLUE SHIELD | Admitting: Cardiovascular Disease

## 2017-10-08 ENCOUNTER — Encounter (INDEPENDENT_AMBULATORY_CARE_PROVIDER_SITE_OTHER): Payer: Self-pay

## 2017-10-08 VITALS — BP 128/60 | HR 62 | Ht 71.0 in | Wt 194.8 lb

## 2017-10-08 DIAGNOSIS — I480 Paroxysmal atrial fibrillation: Secondary | ICD-10-CM | POA: Diagnosis not present

## 2017-10-08 DIAGNOSIS — I25110 Atherosclerotic heart disease of native coronary artery with unstable angina pectoris: Secondary | ICD-10-CM | POA: Diagnosis not present

## 2017-10-08 DIAGNOSIS — I10 Essential (primary) hypertension: Secondary | ICD-10-CM | POA: Diagnosis not present

## 2017-10-08 DIAGNOSIS — E785 Hyperlipidemia, unspecified: Secondary | ICD-10-CM | POA: Diagnosis not present

## 2017-10-08 LAB — BASIC METABOLIC PANEL
BUN / CREAT RATIO: 7 — AB (ref 10–24)
BUN: 8 mg/dL (ref 8–27)
CO2: 23 mmol/L (ref 20–29)
Calcium: 9 mg/dL (ref 8.6–10.2)
Chloride: 81 mmol/L — ABNORMAL LOW (ref 96–106)
Creatinine, Ser: 1.15 mg/dL (ref 0.76–1.27)
GFR, EST AFRICAN AMERICAN: 71 mL/min/{1.73_m2} (ref >59)
GFR, EST NON AFRICAN AMERICAN: 61 mL/min/{1.73_m2} (ref >59)
Glucose: 119 mg/dL — ABNORMAL HIGH (ref 65–99)
POTASSIUM: 4.6 mmol/L (ref 3.5–5.2)
SODIUM: 116 mmol/L — AB (ref 134–144)

## 2017-10-08 LAB — CBC
Hematocrit: 33 % — ABNORMAL LOW (ref 37.5–51.0)
Hemoglobin: 11.2 g/dL — ABNORMAL LOW (ref 13.0–17.7)
MCH: 28.1 pg (ref 26.6–33.0)
MCHC: 33.9 g/dL (ref 31.5–35.7)
MCV: 83 fL (ref 79–97)
PLATELETS: 244 10*3/uL (ref 150–379)
RBC: 3.98 x10E6/uL — ABNORMAL LOW (ref 4.14–5.80)
RDW: 13.6 % (ref 12.3–15.4)
WBC: 8.6 10*3/uL (ref 3.4–10.8)

## 2017-10-08 LAB — PROTIME-INR
INR: 1.1 (ref 0.8–1.2)
Prothrombin Time: 11 s (ref 9.1–12.0)

## 2017-10-08 NOTE — Progress Notes (Signed)
Chief Complaint  Patient presents with  . Follow-up    CAD  . Shortness of Breath     History of Present Illness: 76 yo male with history of DM, HTN, hyperlipidemia, atrial fib/flutter, chronic diastolic CHF and CAD here today for cardiac follow up. He has CAD and was admitted for the first time in 2013 with an inferior STEMI and the occluded RCA was treated with a DES. He was readmitted in 2014 with a NSTEMI and found to have a severe stenosis in the OM branch treated with a DES. First episode of atrial flutter in 2014. Echo in 2014 with normal LV size and function.  Repeat cardiac cath in setting of chest pain in February 2015 and found to have high grade stenosis in the distal RCA treated with a DES. He had several admissions in 2015 with atrial fib with RVR. He developed a rash on Cardizem. He has been managed with amiodarone and metoprolol and anti-coagulated with Eliquis. Repeat cardiac cath in January 2016 with stable CAD. Stress myoview 2017 with no ischemia. Echo February 2017 with LVEF=45-50%, mild AI. Mild MR. He was seen in the ED June 2018 with chest pain and troponin was negative. He was in sinus brady. Admission was recommended but he left the ED against medical advice. He was seen in our office 04/20/17 by Robbie Lis, PA-C and reported improvement in chest pain. Imdur was increased to 60 mg daily. Metoprolol reduced to 25 mg po BID due to bradycardia and dizziness. I saw him in July 2018 and he c/o chest pain. Cardiac cath July 2018 and CAD was stable. He has 3 vessel disease with moderate stenosis throughout all three vessels.   He is here today for follow up. The patient denies any chest pain, palpitations, lower extremity edema, orthopnea, PND, dizziness, near syncope or syncope. He has dyspnea and fatigue with minimal  exertion.     Primary Care Physician: Cyndy Freeze, MD Urology: Joie Bimler, MD   Past Medical History:  Diagnosis Date  . Acute renal insufficiency    a. During 11/2013 adm with atrial flutter.  . Anemia, unspecified   . CAD (coronary artery disease), native coronary artery    a. s/p cath May 2011 >> DES distal RCA;  b. 01/2013 NSTEMI >> OM1 99p (2.75x12 Promus Premier DES);  c. LHC (2/15): PCI: Promus DES to dist RCA ;  d. LHC 1/16 - dLM 85, oLAD 30, small D1 tandem 8, pOM1 stent ok, mOM 50-60, dOM bifurcation 50-60 in both vessels, AV 14-70 jailed by stent, pRCA 57 mRCA stent ok, dRCA stent ok, PLB smal 50, dPDA 60, 80; EF 35-40 >> med Rx - PCI of PDA if angina  . Chronic combined systolic and diastolic CHF (congestive heart failure) ([place]) 12/23/2015   Echo 2/17: Mild LVH, EF 45-50%, inferior akinesis, grade 2 diastolic dysfunction, mild AI, mild MR, mild LAE, PASP 44 mmHg   . Colorectal polyps 2002  . Diabetes mellitus    AODM  . Erectile dysfunction   . History of echocardiogram    a. 01/2013 Echo: EF 55%, mid-dist inflat HK, Gr1 DD, Triv AI/TR, Mild MR.;  b.  Echo (1/15): Mild LVH, EF 55%, inferolateral hypokinesis, grade 1 diastolic dysfunction, mild AI, trivial MR, moderate LAE, PASP 36 mmHg   . Hx of cardiovascular stress test    Stress myoview 08/07/13 with LVEF 44%, inferior scar, no ischemia  . Hyperlipidemia   . Hypertension   . Ischemic cardiomyopathy  a. EF 35-40% by LHC in 1/16; b. Echo 2/17: Mild LVH, EF 45-50%, inferior akinesis, grade 2 diastolic dysfunction, mild AI, mild MR, mild LAE, PASP 44 mmHg  . Myocardial infarction ([place])   . PAF (paroxysmal atrial fibrillation) ([place])    a. Dx 11/2013. Spont conv. Placed on eliquis.    Past Surgical History:  Procedure Laterality Date  . COLONOSCOPY W/ POLYPECTOMY  2002   [facility]  . COLONOSCOPY W/ POLYPECTOMY  2004; 2007 negative  . CORONARY ANGIOPLASTY  12/26/2013  . CORONARY STENT PLACEMENT  2007, 2011  . infantile paralysis facial asymmetry    . LEFT HEART CATH AND CORONARY ANGIOGRAPHY N/A 05/18/2017   Procedure: Left Heart Cath and Coronary Angiography;  Surgeon:  Martinique, Peter M, MD;  Location: [facility];  Service: Cardiovascular;  Laterality: N/A;  . LEFT HEART CATHETERIZATION WITH CORONARY ANGIOGRAM N/A 10/02/2012   Procedure: LEFT HEART CATHETERIZATION WITH CORONARY ANGIOGRAM;  Surgeon: Peter M Martinique, MD;  Location: [facility] CATH LAB;  Service: Cardiovascular;  Laterality: N/A;  . LEFT HEART CATHETERIZATION WITH CORONARY ANGIOGRAM N/A 02/20/2013   Procedure: LEFT HEART CATHETERIZATION WITH CORONARY ANGIOGRAM;  Surgeon: Burnell Blanks, MD;  Location: [facility] CATH LAB;  Service: Cardiovascular;  Laterality: N/A;  . LEFT HEART CATHETERIZATION WITH CORONARY ANGIOGRAM N/A 12/26/2013   Procedure: LEFT HEART CATHETERIZATION WITH CORONARY ANGIOGRAM;  Surgeon: Burnell Blanks, MD;  Location: [facility] CATH LAB;  Service: Cardiovascular;  Laterality: N/A;  . LEFT HEART CATHETERIZATION WITH CORONARY ANGIOGRAM N/A 11/28/2014   Procedure: LEFT HEART CATHETERIZATION WITH CORONARY ANGIOGRAM;  Surgeon: Burnell Blanks, MD;  Location: [facility] CATH LAB;  Service: Cardiovascular;  Laterality: N/A;  . PERCUTANEOUS CORONARY STENT INTERVENTION (PCI-S)  10/02/2012   Procedure: PERCUTANEOUS CORONARY STENT INTERVENTION (PCI-S);  Surgeon: Peter M Martinique, MD;  Location: [facility] CATH LAB;  Service: Cardiovascular;;  . PERCUTANEOUS CORONARY STENT INTERVENTION (PCI-S)  02/20/2013   Procedure: PERCUTANEOUS CORONARY STENT INTERVENTION (PCI-S);  Surgeon: Burnell Blanks, MD;  Location: [facility] CATH LAB;  Service: Cardiovascular;;  . PERCUTANEOUS CORONARY STENT INTERVENTION (PCI-S)  12/26/2013   Procedure: PERCUTANEOUS CORONARY STENT INTERVENTION (PCI-S);  Surgeon: Burnell Blanks, MD;  Location: [facility] CATH LAB;  Service: Cardiovascular;;  . WISDOM TOOTH EXTRACTION      Current Outpatient Medications  Medication Sig Dispense Refill  . amiodarone (PACERONE) 200 MG tablet Take 1 tablet (200 mg total) by mouth daily. 90 tablet 2  . amLODipine (NORVASC) 5 MG tablet TAKE 1 TABLET EVERY  DAY 90 tablet 3  . atorvastatin (LIPITOR) 20 MG tablet Take 20 mg by mouth at bedtime.    . Chelated Potassium 99 MG TABS Take 99 mg by mouth daily as needed (supplementation).    . cholecalciferol (VITAMIN D) 1000 UNITS tablet Take 1,000 Units by mouth every evening.     . clopidogrel (PLAVIX) 75 MG tablet Take 1 tablet (75 mg total) by mouth daily. 90 tablet 3  . ELIQUIS 5 MG TABS tablet TAKE ONE TABLET BY MOUTH TWICE DAILY 180 tablet 1  . fexofenadine (ALLEGRA) 180 MG tablet Take 180 mg by mouth daily as needed for allergies.     . furosemide (LASIX) 20 MG tablet Take 20 mg by mouth daily.    . isosorbide mononitrate (IMDUR) 60 MG 24 hr tablet Take 1 tablet (60 mg total) by mouth daily. 30 tablet 6  . lisinopril (PRINIVIL,ZESTRIL) 20 MG tablet TAKE 1 TABLET EVERY DAY 90 tablet 3  . metoprolol tartrate (LOPRESSOR) 25 MG  tablet Take 1 tablet (25 mg total) by mouth 2 (two) times daily. 60 tablet 6  . Multiple Vitamin (MULTIVITAMIN) tablet Take 1 tablet by mouth at bedtime.     . nitroGLYCERIN (NITROSTAT) 0.4 MG SL tablet Place 0.4 mg under the tongue every 5 (five) minutes as needed for chest pain.    . vitamin E 100 UNIT capsule Take 100 Units by mouth at bedtime.      No current facility-administered medications for this visit.     Allergies  Allergen Reactions  . Brilinta [Ticagrelor] Other (See Comments)    Arthralgias & myalgias  . Crestor [Rosuvastatin] Other (See Comments)    Myalgias    Social History   Socioeconomic History  . Marital status: Married    Spouse name: Not on file  . Number of children: 1  . Years of education: Not on file  . Highest education level: Not on file  Social Needs  . Financial resource strain: Not on file  . Food insecurity - worry: Not on file  . Food insecurity - inability: Not on file  . Transportation needs - medical: Not on file  . Transportation needs - non-medical: Not on file  Occupational History  . Occupation: maintenance     Comment: full time  Tobacco Use  . Smoking status: Former Smoker    Last attempt to quit: 11/02/1984    Years since quitting: 32.9  . Smokeless tobacco: Never Used  . Tobacco comment: smoked [place], up to 3 ppd (!)  Substance and Sexual Activity  . Alcohol use: No  . Drug use: No  . Sexual activity: Not on file  Other Topics Concern  . Not on file  Social History Narrative   [employer] - does maintenance    Family History  Problem Relation Age of Onset  . Heart attack Brother 27  . Colon cancer Brother   . Diabetes Neg Hx   . Stroke Neg Hx   . Hypertension Neg Hx     Review of Systems:  As stated in the HPI and otherwise negative.   BP 128/60   Pulse 62   Ht 5\' 11"  (1.803 m)   Wt 194 lb 12.8 oz (88.4 kg)   SpO2 99%   BMI 27.17 kg/m   Physical Examination:  General: Well developed, well nourished, NAD  HEENT: OP clear, mucus membranes moist  SKIN: warm, dry. No rashes. Neuro: No focal deficits  Musculoskeletal: Muscle strength 5/5 all ext  Psychiatric: Mood and affect normal  Neck: No JVD, no carotid bruits, no thyromegaly, no lymphadenopathy.  Lungs:Clear bilaterally, no wheezes, rhonci, crackles Cardiovascular: Regular rate and rhythm. No murmurs, gallops or rubs. Abdomen:Soft. Bowel sounds present. Non-tender.  Extremities: No lower extremity edema. Pulses are 2 + in the bilateral DP/PT.  Cardiac cath 11/28/14:  1. 3 vessel obstructive CAD    - 40% distal left main    - 50% ostial LAD. The LAD is relatively small.    - diffuse 70% mid first diagonal. This is a large branch    - 75% mid OM 1. Stent in the proximal vessel is still patent    - 80% distal LCx after OM1    - 60% proximal RCA, stents in the mid and distal RCA are patent    - 90% PDA 2. Moderate LV dysfunction with inferior HK. EF 40-45% 3. Normal LVEDP  Plan: compared to prior cath in 2016 there is no significant change. He has  3 vessel diffuse disease without clear focal lesion  suitable for PCI. PCI of the PDA would be very challenging due to severe calcification, tortuosity and multiple prior stents in RCA  Echo 12/23/15: Left ventricle: The cavity size was normal. There was mild  concentric hypertrophy. Systolic function was mildly reduced. The  estimated ejection fraction was in the range of 45% to 50%.  Akinesis and scarring of the basal-midinferior myocardium;  consistent with infarction in the distribution of the right  coronary artery. Features are consistent with a pseudonormal left  ventricular filling pattern, with concomitant abnormal relaxation  and increased filling pressure (grade 2 diastolic dysfunction). - Aortic valve: There was mild regurgitation. - Mitral valve: There was mild regurgitation directed centrally. - Left atrium: The atrium was mildly dilated. - Pulmonary arteries: Systolic pressure was mildly increased. PA  peak pressure: 44 mm Hg (S).  EKG:  EKG is not ordered today. The ekg ordered today demonstrates    Recent Labs: 04/17/2017: B Natriuretic Peptide 393.9 05/14/2017: BUN 19; Creatinine, Ser 1.67; Hemoglobin 12.4; Platelets 201; Potassium 4.6; Sodium 137      Wt Readings from Last 3 Encounters:  10/08/17 194 lb 12.8 oz (88.4 kg)  06/21/17 188 lb 12.8 oz (85.6 kg)  05/18/17 184 lb (83.5 kg)     Other studies Reviewed: Additional studies/ records that were reviewed today include: . Review of the above records demonstrates:    Assessment and Plan:   1. CAD with unstable angina: He has no chest pain but worsened dyspnea and fatigue with exertion. This is likely from his CAD. He is known to have diffuse moderately severe CAD with no good targets for PCI at last cath in July 2018. Continue Imdur, statin, beta blocker and Plavix. Will arrange cardiac cath at [facility] 10/13/17 with possible PCI from left radial approach.   2. HTN: BP is controlled. No changes today.   3. Hyperlipidemia: Continue statin.   4. Atrial  fibrillation/flutter, paroxysmal: He is is sinus today. Will continue metoprolol and amiodarone. Continue Eliquis.   5. Chronic diastolic CHF: Last echo February 2017 with CBSW=96-75%, grade 2 diastolic dysfunction. Volume status ok. Continue Lasix.    Current medicines are reviewed at length with the patient today.  The patient does not have concerns regarding medicines.  The following changes have been made:  no change  Labs/ tests ordered today include:   Orders Placed This Encounter  Procedures  . Basic Metabolic Panel (BMET)  . CBC  . INR/PT    Disposition:   FU in our office post cath.   Signed, Lauree Chandler, MD 10/08/2017 9:19 AM    [facility] [address], [place], [place]  [address] Phone: [phone]; Fax: [phone]

## 2017-10-08 NOTE — Telephone Encounter (Signed)
    Contacted by [employer] regarding a critical lab result. Na+ at 116. Denies any recent confusion. Has experienced nausea which occurred when he required stent placement in the past and thought his nausea was due to anginal symptoms. No history of hyponatremia previously noted. Will need a repeat BMET the morning of his catheterization to make sure this is not a lab error. Currently on Lasix. Recommended he hold Lasix between now and his procedure date and only take if weight increases by more than 3 lbs overnight. Limit fluid intake to < 1500 mL per day. Will make Dr. Angelena Form aware.    Signed, Erma Heritage, PA-C 10/08/2017, 4:39 PM Pager: [phone]

## 2017-10-08 NOTE — Telephone Encounter (Signed)
   Spoke with Dr. Angelena Form and recommended the patient come to the ED for repeat blood work and possible admission if sodium remained significantly low. The patient reports he lives in [place] and far from [facility] or [place]. He is aware of the recommendation. Kindly declines, but is willing to go to an Urgent Care for repeat blood work. I informed the patient that if Na+ remains low, they still may recommend admission.   He voiced understanding of this and was appreciative of the call.   Signed, Erma Heritage, PA-C 10/08/2017, 5:13 PM

## 2017-10-08 NOTE — Patient Instructions (Signed)
Medication Instructions:  Your physician recommends that you continue on your current medications as directed. Please refer to the Current Medication list given to you today. See below for instructions day of procedure.  Labwork: Lab work to be done today--BMP, CBC, PT  Testing/Procedures: Your physician has requested that you have a cardiac catheterization. Cardiac catheterization is used to diagnose and/or treat various heart conditions. Doctors may recommend this procedure for a number of different reasons. The most common reason is to evaluate chest pain. Chest pain can be a symptom of coronary artery disease (CAD), and cardiac catheterization can show whether plaque is narrowing or blocking your heart's arteries. This procedure is also used to evaluate the valves, as well as measure the blood flow and oxygen levels in different parts of your heart. For further information please visit [email or web address]. Please follow instruction sheet, as given.  Scheduled for December 12,2018  Follow-Up: Your physician recommends that you schedule a follow-up appointment in: one month with PA or NP.   Any Other Special Instructions Will Be Listed Below (If Applicable).    [place] OFFICE [address] [place] [address] [place] [address] Dept: [phone] Loc: [place]  10/08/2017  You are scheduled for a Cardiac Catheterization on Wednesday, December 12 with Dr. Lauree Chandler.  1. Please arrive at the [facility] (Main Entrance A) at [facility]: [address] [place], [place] [address] at 11:00 AM (two hours before your procedure to ensure your preparation). Free valet parking service is available.   Special note: Every effort is made to have your procedure done on time. Please understand that emergencies sometimes delay scheduled procedures.  2. Diet: You may have a clear liquid  breakfast prior to 6 AM the day of the procedure.   3. Labs: Lab work was done in office on 10/08/17  4. Medication instructions in preparation for your procedure:  Stop Eliquis after evening dose on 10/10/17  Do not take metformin the evening before and morning of the procedure and for 48 hours after procedure.  Do not take furosemide the morning of the procedure.   On the morning of your procedure, take Aspirin 81 mg and your Clopidogrel and any morning medicines NOT listed above.  You may use sips of water.  5. Plan for one night stay--bring personal belongings. 6. Bring a current list of your medications and current insurance cards. 7. You MUST have a responsible person to drive you home. 8. Someone MUST be with you the first 24 hours after you arrive home or your discharge will be delayed. 9. Please wear clothes that are easy to get on and off and wear slip-on shoes.  Thank you for allowing [place] to care for you!   -- [facility]    If you need a refill on your cardiac medications before your next appointment, please call your pharmacy.

## 2017-10-13 ENCOUNTER — Encounter: Payer: Self-pay | Admitting: *Deleted

## 2017-10-13 ENCOUNTER — Ambulatory Visit (HOSPITAL_COMMUNITY)
Admission: RE | Admit: 2017-10-13 | Payer: BLUE CROSS/BLUE SHIELD | Source: Ambulatory Visit | Admitting: Cardiovascular Disease

## 2017-10-13 ENCOUNTER — Encounter (HOSPITAL_COMMUNITY): Admission: RE | Payer: Self-pay | Source: Ambulatory Visit

## 2017-10-13 ENCOUNTER — Telehealth: Payer: Self-pay | Admitting: *Deleted

## 2017-10-13 DIAGNOSIS — E871 Hypo-osmolality and hyponatremia: Secondary | ICD-10-CM

## 2017-10-13 DIAGNOSIS — R0600 Dyspnea, unspecified: Secondary | ICD-10-CM

## 2017-10-13 SURGERY — LEFT HEART CATH AND CORONARY ANGIOGRAPHY
Anesthesia: LOCAL

## 2017-10-13 NOTE — Telephone Encounter (Signed)
I contacted [facility] and they do not have lab only appointments.  Pt will need to go to [facility] is in [place].  Lab closes at 4:30.  I spoke with pt and he cannot get there by 4:30 today.  He will come to our office tomorrow morning for BMP. Cath has been rescheduled for 11/12/17 at 7:30.  I verbally went over instructions with pt and will leave copy of instructions at front desk for him to pick up tomorrow.  Pt aware he will need to come back for lab work on 11/08/17.  He will plan on keeping appointment on 1/18 with Richardson Dopp, PA for post cath follow up.

## 2017-10-13 NOTE — Telephone Encounter (Signed)
Burnell Blanks, MD  Thompson Grayer, RN        Berniece Salines, Our cath lab nurse Anderson Malta spoke to him today to see if he had repeated his BMET last week as advised. He has not but he did tell her that he is cancelling his cath tomorrow due to having a "cold". Can you call him this week to reschedule the cath? He will need to have repeat BMET also.   Thanks, Gregory Bates

## 2017-10-13 NOTE — Telephone Encounter (Signed)
I spoke with pt. He will go to [facility] in [place] today for lab work.  He cannot go until later this afternoon.  I told pt I would contact them and fax request for BMP to office.  Pt gave me permission to fax recent blood work done in our office to [facility]. Pt reports he cancelled cath due to cough.  He would like to wait until after the first of the year to have cath done.  Requests a Friday.  He is available on 1/11.  Will contact cath lab to arrange and call pt back with information.

## 2017-10-13 NOTE — Telephone Encounter (Signed)
Thanks. If he is still hyponatremic, he will need to see primary care to handle this. These labs are pre-cath but his abnormal lab findings are not cardiac. We have asked him multiple times to follow up on this. Gregory Bates

## 2017-10-14 ENCOUNTER — Telehealth: Payer: Self-pay | Admitting: *Deleted

## 2017-10-14 ENCOUNTER — Other Ambulatory Visit: Payer: BLUE CROSS/BLUE SHIELD | Admitting: *Deleted

## 2017-10-14 DIAGNOSIS — E871 Hypo-osmolality and hyponatremia: Secondary | ICD-10-CM

## 2017-10-14 DIAGNOSIS — R0600 Dyspnea, unspecified: Secondary | ICD-10-CM

## 2017-10-14 LAB — BASIC METABOLIC PANEL
BUN/Creatinine Ratio: 8 — ABNORMAL LOW (ref 10–24)
BUN: 9 mg/dL (ref 8–27)
CALCIUM: 9 mg/dL (ref 8.6–10.2)
CHLORIDE: 83 mmol/L — AB (ref 96–106)
CO2: 24 mmol/L (ref 20–29)
Creatinine, Ser: 1.2 mg/dL (ref 0.76–1.27)
GFR calc Af Amer: 67 mL/min/{1.73_m2} (ref >59)
GFR calc non Af Amer: 58 mL/min/{1.73_m2} — ABNORMAL LOW (ref >59)
GLUCOSE: 114 mg/dL — AB (ref 65–99)
Potassium: 4.8 mmol/L (ref 3.5–5.2)
Sodium: 117 mmol/L — CL (ref 134–144)

## 2017-10-14 NOTE — Telephone Encounter (Signed)
Santiago Glad from [facility] called with critcal sodium level of 117.  Reviewed with Dr. Camillia Herter nurse who will inform MD at this time.

## 2017-10-15 ENCOUNTER — Inpatient Hospital Stay (HOSPITAL_COMMUNITY): Payer: BLUE CROSS/BLUE SHIELD

## 2017-10-15 ENCOUNTER — Inpatient Hospital Stay (HOSPITAL_COMMUNITY)
Admission: EM | Admit: 2017-10-15 | Discharge: 2017-10-19 | DRG: 641 | Discharge status: Against Medical Advice | Payer: BLUE CROSS/BLUE SHIELD | Attending: Internal Medicine | Admitting: Internal Medicine

## 2017-10-15 ENCOUNTER — Telehealth: Payer: Self-pay | Admitting: *Deleted

## 2017-10-15 ENCOUNTER — Encounter (HOSPITAL_COMMUNITY): Payer: Self-pay | Admitting: Emergency Medicine

## 2017-10-15 ENCOUNTER — Other Ambulatory Visit: Payer: Self-pay

## 2017-10-15 DIAGNOSIS — Z955 Presence of coronary angioplasty implant and graft: Secondary | ICD-10-CM | POA: Diagnosis not present

## 2017-10-15 DIAGNOSIS — R6889 Other general symptoms and signs: Secondary | ICD-10-CM | POA: Diagnosis not present

## 2017-10-15 DIAGNOSIS — I252 Old myocardial infarction: Secondary | ICD-10-CM

## 2017-10-15 DIAGNOSIS — I484 Atypical atrial flutter: Secondary | ICD-10-CM | POA: Diagnosis not present

## 2017-10-15 DIAGNOSIS — S0990XA Unspecified injury of head, initial encounter: Secondary | ICD-10-CM | POA: Diagnosis not present

## 2017-10-15 DIAGNOSIS — Z6826 Body mass index (BMI) 26.0-26.9, adult: Secondary | ICD-10-CM | POA: Diagnosis not present

## 2017-10-15 DIAGNOSIS — I480 Paroxysmal atrial fibrillation: Secondary | ICD-10-CM | POA: Diagnosis present

## 2017-10-15 DIAGNOSIS — I255 Ischemic cardiomyopathy: Secondary | ICD-10-CM | POA: Diagnosis present

## 2017-10-15 DIAGNOSIS — Z7901 Long term (current) use of anticoagulants: Secondary | ICD-10-CM | POA: Diagnosis not present

## 2017-10-15 DIAGNOSIS — I714 Abdominal aortic aneurysm, without rupture: Secondary | ICD-10-CM | POA: Diagnosis present

## 2017-10-15 DIAGNOSIS — Z87891 Personal history of nicotine dependence: Secondary | ICD-10-CM

## 2017-10-15 DIAGNOSIS — R031 Nonspecific low blood-pressure reading: Secondary | ICD-10-CM | POA: Diagnosis not present

## 2017-10-15 DIAGNOSIS — I2510 Atherosclerotic heart disease of native coronary artery without angina pectoris: Secondary | ICD-10-CM | POA: Diagnosis present

## 2017-10-15 DIAGNOSIS — I110 Hypertensive heart disease with heart failure: Secondary | ICD-10-CM | POA: Diagnosis not present

## 2017-10-15 DIAGNOSIS — E1151 Type 2 diabetes mellitus with diabetic peripheral angiopathy without gangrene: Secondary | ICD-10-CM | POA: Diagnosis present

## 2017-10-15 DIAGNOSIS — Z8249 Family history of ischemic heart disease and other diseases of the circulatory system: Secondary | ICD-10-CM | POA: Diagnosis not present

## 2017-10-15 DIAGNOSIS — E785 Hyperlipidemia, unspecified: Secondary | ICD-10-CM | POA: Diagnosis not present

## 2017-10-15 DIAGNOSIS — R112 Nausea with vomiting, unspecified: Secondary | ICD-10-CM

## 2017-10-15 DIAGNOSIS — K2970 Gastritis, unspecified, without bleeding: Secondary | ICD-10-CM | POA: Diagnosis not present

## 2017-10-15 DIAGNOSIS — E1159 Type 2 diabetes mellitus with other circulatory complications: Secondary | ICD-10-CM | POA: Diagnosis not present

## 2017-10-15 DIAGNOSIS — R1110 Vomiting, unspecified: Secondary | ICD-10-CM | POA: Diagnosis not present

## 2017-10-15 DIAGNOSIS — R110 Nausea: Secondary | ICD-10-CM | POA: Diagnosis not present

## 2017-10-15 DIAGNOSIS — I25110 Atherosclerotic heart disease of native coronary artery with unstable angina pectoris: Secondary | ICD-10-CM

## 2017-10-15 DIAGNOSIS — I5042 Chronic combined systolic (congestive) and diastolic (congestive) heart failure: Secondary | ICD-10-CM | POA: Diagnosis present

## 2017-10-15 DIAGNOSIS — Z7902 Long term (current) use of antithrombotics/antiplatelets: Secondary | ICD-10-CM

## 2017-10-15 DIAGNOSIS — R05 Cough: Secondary | ICD-10-CM | POA: Diagnosis not present

## 2017-10-15 DIAGNOSIS — I5032 Chronic diastolic (congestive) heart failure: Secondary | ICD-10-CM | POA: Diagnosis present

## 2017-10-15 DIAGNOSIS — E871 Hypo-osmolality and hyponatremia: Secondary | ICD-10-CM | POA: Diagnosis not present

## 2017-10-15 DIAGNOSIS — I4892 Unspecified atrial flutter: Secondary | ICD-10-CM | POA: Diagnosis present

## 2017-10-15 DIAGNOSIS — R1111 Vomiting without nausea: Secondary | ICD-10-CM | POA: Diagnosis not present

## 2017-10-15 LAB — COMPREHENSIVE METABOLIC PANEL
ALBUMIN: 4.2 g/dL (ref 3.5–5.0)
ALT: 55 U/L (ref 17–63)
ANION GAP: 10 (ref 5–15)
AST: 44 U/L — ABNORMAL HIGH (ref 15–41)
Alkaline Phosphatase: 77 U/L (ref 38–126)
BILIRUBIN TOTAL: 0.7 mg/dL (ref 0.3–1.2)
BUN: 9 mg/dL (ref 6–20)
CHLORIDE: 83 mmol/L — AB (ref 101–111)
CO2: 20 mmol/L — ABNORMAL LOW (ref 22–32)
Calcium: 9.2 mg/dL (ref 8.9–10.3)
Creatinine, Ser: 1.2 mg/dL (ref 0.61–1.24)
GFR calc Af Amer: >60 mL/min (ref >60)
GFR, EST NON AFRICAN AMERICAN: 57 mL/min — AB (ref >60)
Glucose, Bld: 150 mg/dL — ABNORMAL HIGH (ref 65–99)
POTASSIUM: 5 mmol/L (ref 3.5–5.1)
Sodium: 113 mmol/L — CL (ref 135–145)
TOTAL PROTEIN: 7.3 g/dL (ref 6.5–8.1)

## 2017-10-15 LAB — CBC
HEMATOCRIT: 34 % — AB (ref 39.0–52.0)
HEMOGLOBIN: 11.9 g/dL — AB (ref 13.0–17.0)
MCH: 28.4 pg (ref 26.0–34.0)
MCHC: 35 g/dL (ref 30.0–36.0)
MCV: 81.1 fL (ref 78.0–100.0)
Platelets: 262 10*3/uL (ref 150–400)
RBC: 4.19 MIL/uL — ABNORMAL LOW (ref 4.22–5.81)
RDW: 12.5 % (ref 11.5–15.5)
WBC: 9.5 10*3/uL (ref 4.0–10.5)

## 2017-10-15 LAB — I-STAT CG4 LACTIC ACID, ED: Lactic Acid, Venous: 0.73 mmol/L (ref 0.5–1.9)

## 2017-10-15 LAB — LIPASE, BLOOD: LIPASE: 44 U/L (ref 11–51)

## 2017-10-15 MED ORDER — SODIUM CHLORIDE 0.9 % IV SOLN
Freq: Once | INTRAVENOUS | Status: AC
  Administered 2017-10-15: 23:00:00 via INTRAVENOUS

## 2017-10-15 NOTE — Telephone Encounter (Signed)
   [facility] Pre-operative Risk Assessment    Request for surgical clearance:  1. What type of surgery is being performed? EGD   2. When is this surgery scheduled? Monday 10/18/17  3. Are there any medications that need to be held prior to surgery and how long?  4. Practice name and name of physician performing surgery? [facility] WITH DR. Lyndel Safe.   5. What is your office phone and fax number?        PHONE # [phone]; [phone]  6. Anesthesia type (None, local, MAC, general) FULL ANESTHESIA   Julaine Hua 10/15/2017, 3:09 PM  _________________________________________________________________   (provider comments below)

## 2017-10-15 NOTE — ED Notes (Signed)
Informed pt. Urine sample is needed. Urinal at bedside

## 2017-10-15 NOTE — ED Provider Notes (Signed)
[name] EMERGENCY DEPARTMENT Provider Note   CSN: [id number] Arrival date & time: 10/15/17  1857     History   Chief Complaint Chief Complaint  Patient presents with  . Emesis    HPI Gregory Bates is a 76 y.o. male.  HPI [phone] with hx of CAD, CHF, DM, HTN, HLD, paroxysmal afib presenting from PCP for hyponatremia.  He states that for the past 3 days he has had nausea and vomiting and inability to tolerate p.o. Emesis NBNB.  Denies abdominal pain.  Denies chest pain, shortness of breath, headache, fevers chills, diarrhea.  Has not had a bowel movement since Wednesday however he has not eaten anything since Wednesday.  She states that when he eats he has very mild lower abdominal pain that only lasts for a minute or 2.  Denies dysuria.  Has been on Lasix for his heart failure however once his cardiologist noted the hyponatremia at his last visit, he was instructed to stop taking the Lasix.  Last dose on Wednesday.  No recent falls or injuries.  Denies dizziness, lightheadedness, vision changes or hearing changes.  No history of cancer.  Non-smoker.  Not on any other diuretic.   Past Medical History:  Diagnosis Date  . Acute renal insufficiency    a. During 11/2013 adm with atrial flutter.  . Anemia, unspecified   . CAD (coronary artery disease), native coronary artery    a. s/p cath May 2011 >> DES distal RCA;  b. 01/2013 NSTEMI >> OM1 99p (2.75x12 Promus Premier DES);  c. LHC (2/15): PCI: Promus DES to dist RCA ;  d. LHC 1/16 - dLM 1, oLAD 30, small D1 tandem 20, pOM1 stent ok, mOM 50-60, dOM bifurcation 50-60 in both vessels, AV 2-70 jailed by stent, pRCA 17 mRCA stent ok, dRCA stent ok, PLB smal 50, dPDA 60, 80; EF 35-40 >> med Rx - PCI of PDA if angina  . Chronic combined systolic and diastolic CHF (congestive heart failure) ([place]) 12/23/2015   Echo 2/17: Mild LVH, EF 45-50%, inferior akinesis, grade 2 diastolic dysfunction, mild AI, mild MR, mild LAE, PASP 44  mmHg   . Colorectal polyps 2002  . Diabetes mellitus    AODM  . Erectile dysfunction   . History of echocardiogram    a. 01/2013 Echo: EF 55%, mid-dist inflat HK, Gr1 DD, Triv AI/TR, Mild MR.;  b.  Echo (1/15): Mild LVH, EF 55%, inferolateral hypokinesis, grade 1 diastolic dysfunction, mild AI, trivial MR, moderate LAE, PASP 36 mmHg   . Hx of cardiovascular stress test    Stress myoview 08/07/13 with LVEF 44%, inferior scar, no ischemia  . Hyperlipidemia   . Hypertension   . Ischemic cardiomyopathy    a. EF 35-40% by LHC in 1/16; b. Echo 2/17: Mild LVH, EF 45-50%, inferior akinesis, grade 2 diastolic dysfunction, mild AI, mild MR, mild LAE, PASP 44 mmHg  . Myocardial infarction ([place])   . PAF (paroxysmal atrial fibrillation) ([place])    a. Dx 11/2013. Spont conv. Placed on eliquis.    Patient Active Problem List   Diagnosis Date Noted  . Hyponatremia 10/15/2017  . Nausea & vomiting 10/15/2017  . Chronic combined systolic and diastolic CHF (congestive heart failure) ([place]) 12/23/2015  . Exertional angina ([facility]) 11/21/2014  . Unstable angina ([place]) 12/26/2013  . Atrial flutter ([place]) 11/03/2013  . NSTEMI (non-ST elevated myocardial infarction) ([place]) 02/17/2013  . ST elevation myocardial infarction (STEMI) of inferior wall, initial episode of care [facility])  10/02/2012  . CAD (coronary artery disease) 10/02/2012  . Type 2 diabetes mellitus with vascular disease ([place]) 09/18/2010  . Essential hypertension 03/28/2010  . COLONIC POLYPS, RECURRENT 04/26/2008  . HYPERLIPIDEMIA 10/07/2007  . ERECTILE DYSFUNCTION 10/07/2007    Past Surgical History:  Procedure Laterality Date  . COLONOSCOPY W/ POLYPECTOMY  2002   [facility]  . COLONOSCOPY W/ POLYPECTOMY  2004; 2007 negative  . CORONARY ANGIOPLASTY  12/26/2013  . CORONARY STENT PLACEMENT  2007, 2011  . infantile paralysis facial asymmetry    . LEFT HEART CATH AND CORONARY ANGIOGRAPHY N/A 05/18/2017   Procedure: Left Heart Cath and Coronary  Angiography;  Surgeon: Martinique, Peter M, MD;  Location: [facility] CV LAB;  Service: Cardiovascular;  Laterality: N/A;  . LEFT HEART CATHETERIZATION WITH CORONARY ANGIOGRAM N/A 10/02/2012   Procedure: LEFT HEART CATHETERIZATION WITH CORONARY ANGIOGRAM;  Surgeon: Peter M Martinique, MD;  Location: [facility] CATH LAB;  Service: Cardiovascular;  Laterality: N/A;  . LEFT HEART CATHETERIZATION WITH CORONARY ANGIOGRAM N/A 02/20/2013   Procedure: LEFT HEART CATHETERIZATION WITH CORONARY ANGIOGRAM;  Surgeon: Burnell Blanks, MD;  Location: [facility] CATH LAB;  Service: Cardiovascular;  Laterality: N/A;  . LEFT HEART CATHETERIZATION WITH CORONARY ANGIOGRAM N/A 12/26/2013   Procedure: LEFT HEART CATHETERIZATION WITH CORONARY ANGIOGRAM;  Surgeon: Burnell Blanks, MD;  Location: [facility] CATH LAB;  Service: Cardiovascular;  Laterality: N/A;  . LEFT HEART CATHETERIZATION WITH CORONARY ANGIOGRAM N/A 11/28/2014   Procedure: LEFT HEART CATHETERIZATION WITH CORONARY ANGIOGRAM;  Surgeon: Burnell Blanks, MD;  Location: [facility] CATH LAB;  Service: Cardiovascular;  Laterality: N/A;  . PERCUTANEOUS CORONARY STENT INTERVENTION (PCI-S)  10/02/2012   Procedure: PERCUTANEOUS CORONARY STENT INTERVENTION (PCI-S);  Surgeon: Peter M Martinique, MD;  Location: [facility] CATH LAB;  Service: Cardiovascular;;  . PERCUTANEOUS CORONARY STENT INTERVENTION (PCI-S)  02/20/2013   Procedure: PERCUTANEOUS CORONARY STENT INTERVENTION (PCI-S);  Surgeon: Burnell Blanks, MD;  Location: [facility] CATH LAB;  Service: Cardiovascular;;  . PERCUTANEOUS CORONARY STENT INTERVENTION (PCI-S)  12/26/2013   Procedure: PERCUTANEOUS CORONARY STENT INTERVENTION (PCI-S);  Surgeon: Burnell Blanks, MD;  Location: [facility] CATH LAB;  Service: Cardiovascular;;  . WISDOM TOOTH EXTRACTION         Home Medications    Prior to Admission medications   Medication Sig Start Date End Date Taking? Authorizing Provider  amiodarone (PACERONE) 200 MG tablet Take 1 tablet (200 mg total) by  mouth daily. 02/24/17  Yes Burnell Blanks, MD  amLODipine (NORVASC) 5 MG tablet TAKE 1 TABLET EVERY DAY 08/20/17  Yes Weaver, Scott T, PA-C  atorvastatin (LIPITOR) 20 MG tablet Take 20 mg by mouth at bedtime.   Yes [provider]  Chelated Potassium 99 MG TABS Take 99 mg by mouth daily as needed (supplementation).   Yes [provider]  cholecalciferol (VITAMIN D) 1000 UNITS tablet Take 1,000 Units by mouth every evening.    Yes [provider]  clopidogrel (PLAVIX) 75 MG tablet Take 1 tablet (75 mg total) by mouth daily. 06/21/17  Yes Weaver, Scott T, PA-C  ELIQUIS 5 MG TABS tablet TAKE ONE TABLET BY MOUTH TWICE DAILY 05/03/17  Yes Burnell Blanks, MD  fexofenadine (ALLEGRA) 180 MG tablet Take 180 mg by mouth daily as needed for allergies.    Yes [provider]  furosemide (LASIX) 20 MG tablet Take 20 mg by mouth daily.   Yes [provider]  isosorbide mononitrate (IMDUR) 60 MG 24 hr tablet Take 1 tablet (60 mg total) by  mouth daily. 04/20/17  Yes Bhagat, Bhavinkumar, PA  lisinopril (PRINIVIL,ZESTRIL) 20 MG tablet TAKE 1 TABLET EVERY DAY 06/01/17  Yes Burnell Blanks, MD  metoprolol tartrate (LOPRESSOR) 25 MG tablet Take 1 tablet (25 mg total) by mouth 2 (two) times daily. 04/20/17  Yes Bhagat, Bhavinkumar, PA  Multiple Vitamin (MULTIVITAMIN) tablet Take 1 tablet by mouth at bedtime.    Yes [provider]  nitroGLYCERIN (NITROSTAT) 0.4 MG SL tablet Place 0.4 mg under the tongue every 5 (five) minutes as needed for chest pain.   Yes [provider]  Ondansetron HCl (ZOFRAN PO) Take 1 tablet by mouth every 6 (six) hours as needed (for nausea).   Yes [provider]  vitamin E 100 UNIT capsule Take 100 Units by mouth at bedtime.    Yes [provider]    Family History Family History  Problem Relation Age of Onset  . Heart attack Brother 52  . Colon cancer Brother   . Diabetes Neg Hx   .  Stroke Neg Hx   . Hypertension Neg Hx     Social History Social History   Tobacco Use  . Smoking status: Former Smoker    Last attempt to quit: 11/02/1984    Years since quitting: 32.9  . Smokeless tobacco: Never Used  . Tobacco comment: smoked Alpha, up to 3 ppd (!)  Substance Use Topics  . Alcohol use: No  . Drug use: No     Allergies   Brilinta [ticagrelor] and Crestor [rosuvastatin]   Review of Systems Review of Systems  Constitutional: Negative for chills and fever.  HENT: Negative for ear pain and sore throat.   Eyes: Negative for pain and visual disturbance.  Respiratory: Negative for cough and shortness of breath.   Cardiovascular: Negative for chest pain and palpitations.  Gastrointestinal: Positive for nausea and vomiting. Negative for abdominal pain.  Genitourinary: Negative for dysuria and hematuria.  Musculoskeletal: Negative for arthralgias and back pain.  Skin: Negative for color change and rash.  Neurological: Negative for seizures and syncope.  All other systems reviewed and are negative.    Physical Exam Updated Vital Signs BP (!) 143/67   Pulse 61   Temp 98.7 F (37.1 C) (Oral)   Resp 16   Ht 5\' 11"  (1.803 m)   Wt 86.2 kg (190 lb)   SpO2 98%   BMI 26.50 kg/m   Physical Exam  Constitutional: He is oriented to person, place, and time. He appears well-developed and well-nourished.  HENT:  Head: Normocephalic and atraumatic.  Mouth/Throat: Mucous membranes are dry.  Eyes: Conjunctivae and EOM are normal. Pupils are equal, round, and reactive to light.  Neck: Normal range of motion. Neck supple.  Cardiovascular: Normal rate and regular rhythm.  No murmur heard. Pulmonary/Chest: Effort normal and breath sounds normal. No respiratory distress.  Abdominal: Soft. He exhibits no distension. There is no tenderness. There is no rigidity, no rebound, no guarding, no CVA tenderness, no tenderness at McBurney's point and negative Murphy's sign.    Musculoskeletal: He exhibits no edema.  Neurological: He is alert and oriented to person, place, and time. He has normal strength. No cranial nerve deficit or sensory deficit. Gait normal. GCS eye subscore is 4. GCS verbal subscore is 5. GCS motor subscore is 6.  Skin: Skin is warm and dry.  Psychiatric: He has a normal mood and affect.  Nursing note and vitals reviewed.    ED Treatments / Results  Labs (all labs  ordered are listed, but only abnormal results are displayed) Labs Reviewed  COMPREHENSIVE METABOLIC PANEL - Abnormal; Notable for the following components:      Result Value   Sodium 113 (*)    Chloride 83 (*)    CO2 20 (*)    Glucose, Bld 150 (*)    AST 44 (*)    GFR calc non Af Amer 57 (*)    All other components within normal limits  CBC - Abnormal; Notable for the following components:   RBC 4.19 (*)    Hemoglobin 11.9 (*)    HCT 34.0 (*)    All other components within normal limits  OSMOLALITY - Abnormal; Notable for the following components:   Osmolality 244 (*)    All other components within normal limits  LIPASE, BLOOD  TROPONIN I  URINALYSIS, ROUTINE W REFLEX MICROSCOPIC  TSH  TROPONIN I  TROPONIN I  CORTISOL  OSMOLALITY, URINE  SODIUM, URINE, RANDOM  I-STAT CG4 LACTIC ACID, ED  I-STAT CG4 LACTIC ACID, ED    EKG  EKG Interpretation None       Radiology No results found.  Procedures Procedures (including critical care time)  Medications Ordered in ED Medications  0.9 %  sodium chloride infusion ( Intravenous New Bag/Given 10/15/17 2240)     Initial Impression / Assessment and Plan / ED Course  I have reviewed the triage vital signs and the nursing notes.  Pertinent labs & imaging results that were available during my care of the patient were reviewed by me and considered in my medical decision making (see chart for details).     76 year old male presenting with hyponatremia.  Afebrile hemodynamic was stable.  Well-appearing on  exam.  Normal neurologic exam.  Benign abdomen with no tenderness palpation no distention.  Low suspicion for internal abdominal pathology or obstruction.  Patient is not nauseous at this time and states his nausea only comes when he tries to eat or drinks.  Has not taken his Lasix and 2 or 3 days.  Non-smoker no history of cancer.  Signs of mild dehydration noted on exam.  Hyponatremic to 113 and hypochloremic to 83.  Lactate normal.  Lipase normal. Concern for hypovolemic hyponatremia.  Could be related to thiazide diuretic however he has been taking this for a long time without any problems.  Will need further evaluation to rule out SIADH, metastatic or cancerous process, renal disorder.  We will start on a slow infusion of normal saline and admit to the hospitalist for further management.  TSH ordered.   Final Clinical Impressions(s) / ED Diagnoses   Final diagnoses:  Nausea & vomiting    ED Discharge Orders    None       Charita Lindenberger [place], MD 10/16/17 Jinger Neighbors, MD 10/17/17 0005

## 2017-10-15 NOTE — H&P (Addendum)
TRH H&P   Patient Demographics:    Gregory Bates, is a 76 y.o. male  MRN: [id number]   DOB - [birth year]  Admit Date - 10/15/2017  Outpatient Primary MD for the patient is Cyndy Freeze, MD  Referring MD/NP/PA:  Drucie Ip  Outpatient Specialists:    Lauree Chandler   Patient coming from:   home  Chief Complaint  Patient presents with  . Emesis      HPI:    Gregory Bates  is a 76 y.o. male, w Pafib , CAD s/p stent, CHF (40-45%) , Hypertension, Hyperlipidemia, Dm2, apparently had n/v after eating cheeze sandwich.  Pt having n/v since Wednesday.   Slight dry cough since Wednesday.  Pt denies fever, chill, cp, palp, sob beyond baseline ,abd pain, diarrhea, brbpr.  Pt was brought to ED for evaluation of low sodium which was found by his pcp. Pt was taken off lasix on Wednesday.   In ED,   Na 113 Bun 9, creatinine 1.2 Glucose 150 Hco3 20  Ast 44, Alt 55, Alk phos 77, T. Bili 0.7  Pt will be admitted for hyponatremia, and n/v.       Review of systems:    In addition to the HPI above,  + slight cough, dry No Fever-chills, No Headache, No changes with Vision or hearing, No problems swallowing food or Liquids, No Chest pain, No Abdominal pain, no constipation No Blood in stool or Urine, No dysuria, No new skin rashes or bruises, No new joints pains-aches,  No new weakness, tingling, numbness in any extremity, No recent weight gain or loss, No polyuria, polydypsia or polyphagia, No significant Mental Stressors.   A full 10 point Review of Systems was done, except as stated above, all other Review of Systems were negative.   With Past History of the following :    Past Medical History:  Diagnosis Date  . Acute renal insufficiency    a. During 11/2013 adm with atrial flutter.  . Anemia, unspecified   . CAD (coronary artery disease), native coronary artery      a. s/p cath May 2011 >> DES distal RCA;  b. 01/2013 NSTEMI >> OM1 99p (2.75x12 Promus Premier DES);  c. LHC (2/15): PCI: Promus DES to dist RCA ;  d. LHC 1/16 - dLM 8, oLAD 30, small D1 tandem 28, pOM1 stent ok, mOM 50-60, dOM bifurcation 50-60 in both vessels, AV 40-70 jailed by stent, pRCA 59 mRCA stent ok, dRCA stent ok, PLB smal 50, dPDA 60, 80; EF 35-40 >> med Rx - PCI of PDA if angina  . Chronic combined systolic and diastolic CHF (congestive heart failure) ([place]) 12/23/2015   Echo 2/17: Mild LVH, EF 45-50%, inferior akinesis, grade 2 diastolic dysfunction, mild AI, mild MR, mild LAE, PASP 44 mmHg   . Colorectal polyps 2002  . Diabetes mellitus    AODM  .  Erectile dysfunction   . History of echocardiogram    a. 01/2013 Echo: EF 55%, mid-dist inflat HK, Gr1 DD, Triv AI/TR, Mild MR.;  b.  Echo (1/15): Mild LVH, EF 55%, inferolateral hypokinesis, grade 1 diastolic dysfunction, mild AI, trivial MR, moderate LAE, PASP 36 mmHg   . Hx of cardiovascular stress test    Stress myoview 08/07/13 with LVEF 44%, inferior scar, no ischemia  . Hyperlipidemia   . Hypertension   . Ischemic cardiomyopathy    a. EF 35-40% by LHC in 1/16; b. Echo 2/17: Mild LVH, EF 45-50%, inferior akinesis, grade 2 diastolic dysfunction, mild AI, mild MR, mild LAE, PASP 44 mmHg  . Myocardial infarction ([place])   . PAF (paroxysmal atrial fibrillation) ([place])    a. Dx 11/2013. Spont conv. Placed on eliquis.      Past Surgical History:  Procedure Laterality Date  . COLONOSCOPY W/ POLYPECTOMY  2002   [facility]  . COLONOSCOPY W/ POLYPECTOMY  2004; 2007 negative  . CORONARY ANGIOPLASTY  12/26/2013  . CORONARY STENT PLACEMENT  2007, 2011  . infantile paralysis facial asymmetry    . LEFT HEART CATH AND CORONARY ANGIOGRAPHY N/A 05/18/2017   Procedure: Left Heart Cath and Coronary Angiography;  Surgeon: Martinique, Peter M, MD;  Location: [place] [facility];  Service: Cardiovascular;  Laterality: N/A;  . LEFT HEART CATHETERIZATION  WITH CORONARY ANGIOGRAM N/A 10/02/2012   Procedure: LEFT HEART CATHETERIZATION WITH CORONARY ANGIOGRAM;  Surgeon: Peter M Martinique, MD;  Location: [facility] CATH LAB;  Service: Cardiovascular;  Laterality: N/A;  . LEFT HEART CATHETERIZATION WITH CORONARY ANGIOGRAM N/A 02/20/2013   Procedure: LEFT HEART CATHETERIZATION WITH CORONARY ANGIOGRAM;  Surgeon: Burnell Blanks, MD;  Location: [facility] CATH LAB;  Service: Cardiovascular;  Laterality: N/A;  . LEFT HEART CATHETERIZATION WITH CORONARY ANGIOGRAM N/A 12/26/2013   Procedure: LEFT HEART CATHETERIZATION WITH CORONARY ANGIOGRAM;  Surgeon: Burnell Blanks, MD;  Location: [facility] CATH LAB;  Service: Cardiovascular;  Laterality: N/A;  . LEFT HEART CATHETERIZATION WITH CORONARY ANGIOGRAM N/A 11/28/2014   Procedure: LEFT HEART CATHETERIZATION WITH CORONARY ANGIOGRAM;  Surgeon: Burnell Blanks, MD;  Location: [facility] CATH LAB;  Service: Cardiovascular;  Laterality: N/A;  . PERCUTANEOUS CORONARY STENT INTERVENTION (PCI-S)  10/02/2012   Procedure: PERCUTANEOUS CORONARY STENT INTERVENTION (PCI-S);  Surgeon: Peter M Martinique, MD;  Location: [facility] CATH LAB;  Service: Cardiovascular;;  . PERCUTANEOUS CORONARY STENT INTERVENTION (PCI-S)  02/20/2013   Procedure: PERCUTANEOUS CORONARY STENT INTERVENTION (PCI-S);  Surgeon: Burnell Blanks, MD;  Location: [facility] CATH LAB;  Service: Cardiovascular;;  . PERCUTANEOUS CORONARY STENT INTERVENTION (PCI-S)  12/26/2013   Procedure: PERCUTANEOUS CORONARY STENT INTERVENTION (PCI-S);  Surgeon: Burnell Blanks, MD;  Location: [facility] CATH LAB;  Service: Cardiovascular;;  . WISDOM TOOTH EXTRACTION        Social History:     Social History   Tobacco Use  . Smoking status: Former Smoker    Last attempt to quit: 11/02/1984    Years since quitting: 32.9  . Smokeless tobacco: Never Used  . Tobacco comment: smoked [place], up to 3 ppd (!)  Substance Use Topics  . Alcohol use: No     Lives - at home  Mobility - walks by self   Family History :     Family History  Problem Relation Age of Onset  . Heart attack Brother 24  . Colon cancer Brother   . Diabetes Neg Hx   . Stroke Neg Hx   . Hypertension Neg Hx  Home Medications:   Prior to Admission medications   Medication Sig Start Date End Date Taking? Authorizing Provider  amiodarone (PACERONE) 200 MG tablet Take 1 tablet (200 mg total) by mouth daily. 02/24/17  Yes Burnell Blanks, MD  amLODipine (NORVASC) 5 MG tablet TAKE 1 TABLET EVERY DAY 08/20/17  Yes Weaver, Scott T, PA-C  atorvastatin (LIPITOR) 20 MG tablet Take 20 mg by mouth at bedtime.   Yes [provider]  Chelated Potassium 99 MG TABS Take 99 mg by mouth daily as needed (supplementation).   Yes [provider]  cholecalciferol (VITAMIN D) 1000 UNITS tablet Take 1,000 Units by mouth every evening.    Yes [provider]  clopidogrel (PLAVIX) 75 MG tablet Take 1 tablet (75 mg total) by mouth daily. 06/21/17  Yes Weaver, Scott T, PA-C  ELIQUIS 5 MG TABS tablet TAKE ONE TABLET BY MOUTH TWICE DAILY 05/03/17  Yes Burnell Blanks, MD  fexofenadine (ALLEGRA) 180 MG tablet Take 180 mg by mouth daily as needed for allergies.    Yes [provider]  furosemide (LASIX) 20 MG tablet Take 20 mg by mouth daily.   Yes [provider]  isosorbide mononitrate (IMDUR) 60 MG 24 hr tablet Take 1 tablet (60 mg total) by mouth daily. 04/20/17  Yes Bhagat, Bhavinkumar, PA  lisinopril (PRINIVIL,ZESTRIL) 20 MG tablet TAKE 1 TABLET EVERY DAY 06/01/17  Yes Burnell Blanks, MD  metoprolol tartrate (LOPRESSOR) 25 MG tablet Take 1 tablet (25 mg total) by mouth 2 (two) times daily. 04/20/17  Yes Bhagat, Bhavinkumar, PA  Multiple Vitamin (MULTIVITAMIN) tablet Take 1 tablet by mouth at bedtime.    Yes [provider]  nitroGLYCERIN (NITROSTAT) 0.4 MG SL tablet Place 0.4 mg under the tongue every 5 (five) minutes as needed for chest pain.   Yes [provider]  Ondansetron HCl (ZOFRAN PO) Take 1 tablet by mouth every 6 (six) hours as needed (for nausea).   Yes [provider]  vitamin E 100 UNIT capsule Take 100 Units by mouth at bedtime.    Yes [provider]     Allergies:     Allergies  Allergen Reactions  . Brilinta [Ticagrelor] Other (See Comments)    Arthralgias & myalgias  . Crestor [Rosuvastatin] Other (See Comments)    Myalgias     Physical Exam:   Vitals  Blood pressure 120/64, pulse 64, temperature 98.7 F (37.1 C), temperature source Oral, resp. rate 16, height _0  (1.803 m), weight 86.2 kg (190 lb), SpO2 98 %.   1. General  lying in bed in NAD,   2. Normal affect and insight, Not Suicidal or Homicidal, Awake Alert, Oriented X 3.  3. No F.N deficits, ALL C.Nerves Intact, Strength 5/5 all 4 extremities, Sensation intact all 4 extremities, Plantars down going.  4. Ears and Eyes appear Normal, Conjunctivae clear, PERRLA. Moist Oral Mucosa.  5. Supple Neck, No JVD, No cervical lymphadenopathy appriciated, No Carotid Bruits.  6. Symmetrical Chest wall movement, Good air movement bilaterally, CTAB.  7. RRR, No Gallops, Rubs or Murmurs, No Parasternal Heave.  8. Positive Bowel Sounds, Abdomen Soft, No tenderness, No organomegaly appriciated,No rebound -guarding or rigidity.  9.  No Cyanosis, Normal Skin Turgor, No Skin Rash or Bruise.  10. Good muscle tone,  joints appear normal , no effusions, Normal ROM.  11. No Palpable Lymph Nodes in Neck or Axillae     Data Review:    CBC Recent Labs  Lab  10/15/17 1915  WBC 9.5  HGB 11.9*  HCT 34.0*  PLT 262  MCV 81.1  MCH 28.4  MCHC 35.0  RDW 12.5   ------------------------------------------------------------------------------------------------------------------  Chemistries  Recent Labs  Lab 10/14/17 0728 10/15/17 1915  NA 117* 113*  K 4.8 5.0  CL 83* 83*  CO2 24 20*  GLUCOSE 114* 150*  BUN 9 9  CREATININE 1.20 1.20   CALCIUM 9.0 9.2  AST  --  44*  ALT  --  55  ALKPHOS  --  77  BILITOT  --  0.7   ------------------------------------------------------------------------------------------------------------------ estimated creatinine clearance is 55.8 mL/min (by C-G formula based on SCr of 1.2 mg/dL). ------------------------------------------------------------------------------------------------------------------ No results for input(s): TSH, T4TOTAL, T3FREE, THYROIDAB in the last 72 hours.  Invalid input(s): FREET3  Coagulation profile No results for input(s): INR, PROTIME in the last 168 hours. ------------------------------------------------------------------------------------------------------------------- No results for input(s): DDIMER in the last 72 hours. -------------------------------------------------------------------------------------------------------------------  Cardiac Enzymes No results for input(s): CKMB, TROPONINI, MYOGLOBIN in the last 168 hours.  Invalid input(s): CK ------------------------------------------------------------------------------------------------------------------    Component Value Date/Time   BNP 393.9 (H) 04/17/2017 1324   BNP 532.2 (H) 12/05/2015 1314     ---------------------------------------------------------------------------------------------------------------  Urinalysis No results found for: COLORURINE, APPEARANCEUR, LABSPEC, PHURINE, GLUCOSEU, HGBUR, BILIRUBINUR, KETONESUR, PROTEINUR, UROBILINOGEN, NITRITE, LEUKOCYTESUR  ----------------------------------------------------------------------------------------------------------------   Imaging Results:    No results found.    Assessment & Plan:    Principal Problem:   Hyponatremia Active Problems:   Nausea & vomiting  N/v Check xray abdomen Check CT scan abd/ pelvis Clear liquid diet  zofran 82m iv q6h prn  protonix 41mpo bid  Hyponatremia Check serum osm, cortisol,  tsh Urine sodium, urine osm Hydrate gently w ns iv Bmp in am, and at noon  CHF  Hold Lasix  CAD 3V Cont lisinopril Cont metoprolol Cont plavix Cont imdur Cont amlodipine Cont lipitor  Pafib Cont metoprolol Cont amiodarone Cont eliquis  Dm2 fsbs ac and qhs, ISS Check hga1c  DVT Prophylaxis Eliquis,  SCDs   AM Labs Ordered, also please review Full Orders  Family Communication: Admission, patients condition and plan of care including tests being ordered have been discussed with the patient  who indicate understanding and agree with the plan and Code Status.  Code Status FULL CODE  Likely DC to  home  Condition GUARDED    Consults called:     Admission status:   inpatient  Time spent in minutes : 45    JaJani Gravel.D on 10/15/2017 at 11:54 PM  Between 7am to 7pm - Pager - [phone] After 7pm go to [email or web address] - password [email or web address] - Office  [phone]

## 2017-10-15 NOTE — Telephone Encounter (Signed)
   Primary Cardiologist: Dr. Angelena Form  Chart reviewed as part of pre-operative protocol coverage. Patient was contacted 10/15/2017 in reference to pre-operative risk assessment for pending surgery as outlined below.  Gregory Bates was last seen on  10/08/17 by Dr. Angelena Form and is scheduled for a cardiac cath for chest pain 11/12/16.   Additionally pt's sodium is 116 to 117.  Discussed with Dr. Angelena Form and he does not believe pt should have EGD at this time.   Due to new or worsening symptoms,  This could be revisited after cardiac cath in Jan.  I contacted pt and asked him not to have.   Pre-op covering staff:  - Please contact requesting surgeon's office via preferred method (i.e, phone, fax) to inform them of further cardiac testing prior to EGD.[name] Kitchen  Cecilie Kicks, NP 10/15/2017, 3:40 PM

## 2017-10-15 NOTE — ED Triage Notes (Signed)
Pt reports n/v for 3 days, went to PCP today, was given zofran, took two doses, has helped "a little".  No nausea "at this minute b/c I just took a pill (zofran)."  Denies fevers, dizziness, loose stools, chest pain or SOB.

## 2017-10-15 NOTE — ED Notes (Signed)
Date and time results received: 10/15/17 8:46 PM  (use smartphrase ".now" to insert current time)  Test: Sodium Critical Value: 113  Name of Provider Notified: Dr. Kathrynn Humble  Orders Received? Or Actions Taken?: to next treatment room

## 2017-10-15 NOTE — Telephone Encounter (Signed)
Fax sent to Dr. Steve Rattler office @ [facility].

## 2017-10-16 ENCOUNTER — Encounter (HOSPITAL_COMMUNITY): Payer: Self-pay | Admitting: *Deleted

## 2017-10-16 ENCOUNTER — Inpatient Hospital Stay (HOSPITAL_COMMUNITY): Payer: BLUE CROSS/BLUE SHIELD

## 2017-10-16 ENCOUNTER — Other Ambulatory Visit: Payer: Self-pay

## 2017-10-16 LAB — BASIC METABOLIC PANEL
ANION GAP: 7 (ref 5–15)
ANION GAP: 7 (ref 5–15)
ANION GAP: 7 (ref 5–15)
BUN: 8 mg/dL (ref 6–20)
BUN: 8 mg/dL (ref 6–20)
BUN: 9 mg/dL (ref 6–20)
CALCIUM: 8.4 mg/dL — AB (ref 8.9–10.3)
CALCIUM: 8 mg/dL — AB (ref 8.9–10.3)
CALCIUM: 8 mg/dL — AB (ref 8.9–10.3)
CO2: 21 mmol/L — ABNORMAL LOW (ref 22–32)
CO2: 22 mmol/L (ref 22–32)
CO2: 21 mmol/L — ABNORMAL LOW (ref 22–32)
Chloride: 87 mmol/L — ABNORMAL LOW (ref 101–111)
Chloride: 86 mmol/L — ABNORMAL LOW (ref 101–111)
Chloride: 84 mmol/L — ABNORMAL LOW (ref 101–111)
Creatinine, Ser: 1.1 mg/dL (ref 0.61–1.24)
Creatinine, Ser: 1.19 mg/dL (ref 0.61–1.24)
Creatinine, Ser: 1.19 mg/dL (ref 0.61–1.24)
GFR calc Af Amer: >60 mL/min (ref >60)
GFR calc Af Amer: >60 mL/min (ref >60)
GFR, EST NON AFRICAN AMERICAN: 58 mL/min — AB (ref >60)
GFR, EST NON AFRICAN AMERICAN: 58 mL/min — AB (ref >60)
GLUCOSE: 92 mg/dL (ref 65–99)
GLUCOSE: 99 mg/dL (ref 65–99)
GLUCOSE: 151 mg/dL — AB (ref 65–99)
POTASSIUM: 4.3 mmol/L (ref 3.5–5.1)
Potassium: 4.3 mmol/L (ref 3.5–5.1)
Potassium: 4.3 mmol/L (ref 3.5–5.1)
SODIUM: 115 mmol/L — AB (ref 135–145)
SODIUM: 115 mmol/L — AB (ref 135–145)
SODIUM: 112 mmol/L — AB (ref 135–145)

## 2017-10-16 LAB — CBC
HCT: 28.9 % — ABNORMAL LOW (ref 39.0–52.0)
Hemoglobin: 10 g/dL — ABNORMAL LOW (ref 13.0–17.0)
MCH: 28.1 pg (ref 26.0–34.0)
MCHC: 34.6 g/dL (ref 30.0–36.0)
MCV: 81.2 fL (ref 78.0–100.0)
PLATELETS: 218 10*3/uL (ref 150–400)
RBC: 3.56 MIL/uL — AB (ref 4.22–5.81)
RDW: 12.6 % (ref 11.5–15.5)
WBC: 8.2 10*3/uL (ref 4.0–10.5)

## 2017-10-16 LAB — TROPONIN I
Troponin I: <0.03 ng/mL (ref <0.03)
Troponin I: <0.03 ng/mL (ref <0.03)
Troponin I: <0.03 ng/mL (ref <0.03)

## 2017-10-16 LAB — URINALYSIS, ROUTINE W REFLEX MICROSCOPIC
BILIRUBIN URINE: NEGATIVE
Glucose, UA: NEGATIVE mg/dL
HGB URINE DIPSTICK: NEGATIVE
Ketones, ur: NEGATIVE mg/dL
Leukocytes, UA: NEGATIVE
Nitrite: NEGATIVE
PH: 7 (ref 5.0–8.0)
Protein, ur: NEGATIVE mg/dL
SPECIFIC GRAVITY, URINE: 1.01 (ref 1.005–1.030)

## 2017-10-16 LAB — CBG MONITORING, ED
GLUCOSE-CAPILLARY: 91 mg/dL (ref 65–99)
Glucose-Capillary: 91 mg/dL (ref 65–99)

## 2017-10-16 LAB — GLUCOSE, CAPILLARY
GLUCOSE-CAPILLARY: 93 mg/dL (ref 65–99)
Glucose-Capillary: 92 mg/dL (ref 65–99)

## 2017-10-16 LAB — COMPREHENSIVE METABOLIC PANEL
ALT: 44 U/L (ref 17–63)
AST: 36 U/L (ref 15–41)
Albumin: 3.4 g/dL — ABNORMAL LOW (ref 3.5–5.0)
Alkaline Phosphatase: 63 U/L (ref 38–126)
Anion gap: 8 (ref 5–15)
BILIRUBIN TOTAL: 0.5 mg/dL (ref 0.3–1.2)
BUN: 8 mg/dL (ref 6–20)
CALCIUM: 8.4 mg/dL — AB (ref 8.9–10.3)
CO2: 21 mmol/L — ABNORMAL LOW (ref 22–32)
CREATININE: 1.16 mg/dL (ref 0.61–1.24)
Chloride: 88 mmol/L — ABNORMAL LOW (ref 101–111)
GFR, EST NON AFRICAN AMERICAN: 59 mL/min — AB (ref >60)
Glucose, Bld: 85 mg/dL (ref 65–99)
Potassium: 4.2 mmol/L (ref 3.5–5.1)
Sodium: 117 mmol/L — CL (ref 135–145)
TOTAL PROTEIN: 5.7 g/dL — AB (ref 6.5–8.1)

## 2017-10-16 LAB — OSMOLALITY, URINE: Osmolality, Ur: 389 mOsm/kg (ref 300–900)

## 2017-10-16 LAB — MRSA PCR SCREENING: MRSA BY PCR: NEGATIVE

## 2017-10-16 LAB — TSH: TSH: 0.628 u[IU]/mL (ref 0.350–4.500)

## 2017-10-16 LAB — SODIUM, URINE, RANDOM: SODIUM UR: 94 mmol/L

## 2017-10-16 LAB — CORTISOL: CORTISOL PLASMA: 9.7 ug/dL

## 2017-10-16 LAB — OSMOLALITY: OSMOLALITY: 244 mosm/kg — AB (ref 275–295)

## 2017-10-16 MED ORDER — ISOSORBIDE MONONITRATE ER 60 MG PO TB24
60.0000 mg | ORAL_TABLET | Freq: Every day | ORAL | Status: DC
  Administered 2017-10-16 – 2017-10-19 (×4): 60 mg via ORAL
  Filled 2017-10-16: qty 2
  Filled 2017-10-16 (×3): qty 1

## 2017-10-16 MED ORDER — ATORVASTATIN CALCIUM 20 MG PO TABS
20.0000 mg | ORAL_TABLET | Freq: Every day | ORAL | Status: DC
  Administered 2017-10-17 – 2017-10-18 (×2): 20 mg via ORAL
  Filled 2017-10-16 (×3): qty 1

## 2017-10-16 MED ORDER — AMLODIPINE BESYLATE 5 MG PO TABS
5.0000 mg | ORAL_TABLET | Freq: Every day | ORAL | Status: DC
  Administered 2017-10-16 – 2017-10-19 (×4): 5 mg via ORAL
  Filled 2017-10-16 (×4): qty 1

## 2017-10-16 MED ORDER — SODIUM CHLORIDE 0.9 % IV SOLN
INTRAVENOUS | Status: DC
  Administered 2017-10-16: 03:00:00 via INTRAVENOUS

## 2017-10-16 MED ORDER — BENZONATATE 100 MG PO CAPS
100.0000 mg | ORAL_CAPSULE | Freq: Three times a day (TID) | ORAL | Status: DC | PRN
  Administered 2017-10-16: 100 mg via ORAL
  Filled 2017-10-16: qty 1

## 2017-10-16 MED ORDER — HYDROCODONE-HOMATROPINE 5-1.5 MG/5ML PO SYRP
5.0000 mL | ORAL_SOLUTION | Freq: Four times a day (QID) | ORAL | Status: DC | PRN
  Administered 2017-10-16 – 2017-10-18 (×5): 5 mL via ORAL
  Filled 2017-10-16 (×5): qty 5

## 2017-10-16 MED ORDER — METOPROLOL TARTRATE 25 MG PO TABS
25.0000 mg | ORAL_TABLET | Freq: Two times a day (BID) | ORAL | Status: DC
  Administered 2017-10-16 – 2017-10-19 (×7): 25 mg via ORAL
  Filled 2017-10-16 (×8): qty 1

## 2017-10-16 MED ORDER — CLOPIDOGREL BISULFATE 75 MG PO TABS
75.0000 mg | ORAL_TABLET | Freq: Every day | ORAL | Status: DC
  Administered 2017-10-16 – 2017-10-19 (×4): 75 mg via ORAL
  Filled 2017-10-16 (×4): qty 1

## 2017-10-16 MED ORDER — PANTOPRAZOLE SODIUM 40 MG PO TBEC
40.0000 mg | DELAYED_RELEASE_TABLET | Freq: Two times a day (BID) | ORAL | Status: DC
  Administered 2017-10-16 – 2017-10-19 (×7): 40 mg via ORAL
  Filled 2017-10-16 (×8): qty 1

## 2017-10-16 MED ORDER — APIXABAN 5 MG PO TABS
5.0000 mg | ORAL_TABLET | Freq: Two times a day (BID) | ORAL | Status: DC
  Administered 2017-10-16 – 2017-10-19 (×6): 5 mg via ORAL
  Filled 2017-10-16 (×7): qty 1

## 2017-10-16 MED ORDER — ADULT MULTIVITAMIN W/MINERALS CH
1.0000 | ORAL_TABLET | Freq: Every day | ORAL | Status: DC
  Administered 2017-10-17 – 2017-10-18 (×2): 1 via ORAL
  Filled 2017-10-16 (×3): qty 1

## 2017-10-16 MED ORDER — ACETAMINOPHEN 650 MG RE SUPP
650.0000 mg | Freq: Four times a day (QID) | RECTAL | Status: DC | PRN
Start: 2017-10-16 — End: 2017-10-19

## 2017-10-16 MED ORDER — SODIUM CHLORIDE 0.9 % IV SOLN
INTRAVENOUS | Status: DC
  Administered 2017-10-16: 07:00:00 via INTRAVENOUS

## 2017-10-16 MED ORDER — AMIODARONE HCL 200 MG PO TABS
200.0000 mg | ORAL_TABLET | Freq: Every day | ORAL | Status: DC
  Administered 2017-10-16 – 2017-10-19 (×4): 200 mg via ORAL
  Filled 2017-10-16 (×4): qty 1

## 2017-10-16 MED ORDER — LORATADINE 10 MG PO TABS
10.0000 mg | ORAL_TABLET | Freq: Every day | ORAL | Status: DC
  Administered 2017-10-16 – 2017-10-19 (×4): 10 mg via ORAL
  Filled 2017-10-16 (×4): qty 1

## 2017-10-16 MED ORDER — NITROGLYCERIN 0.4 MG SL SUBL: 0.4000 mg | SUBLINGUAL_TABLET | SUBLINGUAL | Status: DC | PRN

## 2017-10-16 MED ORDER — IOPAMIDOL (ISOVUE-300) INJECTION 61%
INTRAVENOUS | Status: AC
  Filled 2017-10-16: qty 30

## 2017-10-16 MED ORDER — ONDANSETRON HCL 4 MG/2ML IJ SOLN
4.0000 mg | Freq: Four times a day (QID) | INTRAMUSCULAR | Status: DC | PRN
  Filled 2017-10-16: qty 2

## 2017-10-16 MED ORDER — ONDANSETRON HCL 4 MG/2ML IJ SOLN
4.0000 mg | Freq: Four times a day (QID) | INTRAMUSCULAR | Status: DC | PRN
  Administered 2017-10-16 – 2017-10-17 (×2): 4 mg via INTRAVENOUS
  Filled 2017-10-16 (×2): qty 2

## 2017-10-16 MED ORDER — INSULIN ASPART 100 UNIT/ML ~~LOC~~ SOLN: 0.0000 [IU] | Freq: Every day | SUBCUTANEOUS | Status: DC

## 2017-10-16 MED ORDER — PNEUMOCOCCAL VAC POLYVALENT 25 MCG/0.5ML IJ INJ: 0.5000 mL | INJECTION | INTRAMUSCULAR | Status: DC

## 2017-10-16 MED ORDER — INSULIN ASPART 100 UNIT/ML ~~LOC~~ SOLN: 0.0000 [IU] | Freq: Three times a day (TID) | SUBCUTANEOUS | Status: DC

## 2017-10-16 MED ORDER — LISINOPRIL 20 MG PO TABS
20.0000 mg | ORAL_TABLET | Freq: Every day | ORAL | Status: DC
  Administered 2017-10-16: 20 mg via ORAL
  Filled 2017-10-16: qty 1

## 2017-10-16 MED ORDER — ACETAMINOPHEN 325 MG PO TABS: 650.0000 mg | ORAL_TABLET | Freq: Four times a day (QID) | ORAL | Status: DC | PRN

## 2017-10-16 NOTE — Progress Notes (Signed)
After discussing cath plans for tomorrow with all staff RN's including Lavella Lemons and Neldon Newport we could not decide if patient was going for Cath in AM or not. Cath lab orders were placed on 10/08/2017, notes read cath was scheduled for 11/12/2017. Patient stated he was told he was going tomorrow. Tomorrow being Sunday, still not sure if patient is going 12/16.2018. Checked with Cardiologist on call Dr. Eula Fried and he stated he would not think so unless it was an emergency. Day RN reported to get him ready for cath lab. Question was to give Eliquis tonight for his Afib history. Dr. Eula Fried stated to give the medication and patient states he was told not to take it tonight. Will hold the Eliquis and prepare the patient for cath on 10/17/2017. Patient will remain NPO after midnight. Permit will be signed tomorrow. All preps will be done.

## 2017-10-16 NOTE — Progress Notes (Signed)
[facility]                                                                              Patient Demographics  Gregory Bates, is a 76 y.o. male, DOB - 11/07/1940, WCB:[id number]  Admit date - 10/15/2017   Admitting Physician No admitting provider for patient encounter.  Outpatient Primary MD for the patient is Cyndy Freeze, MD  Outpatient specialists:   LOS - 1  days   Medical records reviewed and are as summarized below:    Chief Complaint  Patient presents with  . Emesis       Brief summary   Per admit note by Dr. Maudie Mercury on 12/14 Gregory Bates  is a 76 y.o. male, w Pafib , CAD s/p stent, CHF (40-45%) , Hypertension, Hyperlipidemia, Dm2, apparently had n/v after eating cheeze sandwich.  Pt having n/v since Wednesday.   Slight dry cough since Wednesday.  Pt denied fever, chill, cp, palp, sob beyond baseline ,abd pain, diarrhea, brbpr.  Pt was brought to ED for evaluation of low sodium which was found by his pcp. Pt was taken off lasix on Wednesday.   Assessment & Plan    Principal Problem: Acute hyponatremia -Unclear etiology, also having nausea and vomiting for last 3 days, presented with sodium of 113.  Not symptomatic, patient alert and oriented. -Placed on gentle hydration, improved to 117 now trending down again.  Serum osmolarity low 244, urine osmolarity 389, urine sodium 94 - will decrease IV fluids given history of CHF, nephrology consult requested -Serial BMET every 6 hours   Active Problems:   Nausea & vomiting -Unclear etiology, CT abdomen pelvis showed no acute abnormality.  Aneurysmal dilatation of infrarenal abdominal aorta 3.9 cm seen incidentally -Continue clear liquid diet, advance as tolerated  Infrarenal abdominal aorta aneurysm -Incidentally CT abdomen showed aneurysmal dilatation of infrarenal abdominal aorta to 3.9 cm, recommended follow-up ultrasound in 2 years.  Chronic combined systolic and diastolic CHF -2D echo 6/16, showed EF  of 45%-50%, akinesis and scarring of the basal mid inferior myocardium, grade 2 diastolic dysfunction -Currently holding Lasix and lisinopril. -Continue metoprolol, Plavix, Imdur, amlodipine, Lipitor   Paroxysmal atrial fibrillation -Currently rate controlled, continue metoprolol, amiodarone -Mali vasc 4, Continue Eliquis for anticoagulation  Diabetes mellitus Continue sliding scale insulin, follow hemoglobin A1  Mechanical fall -Patient had mechanical fall, notified by RN, had hit his head on the table, will obtain CT head  Code Status: Full code DVT Prophylaxis: Apixaban Family Communication: Discussed in detail with the patient, all imaging results, lab results explained to the patient and wife at the bedside   Disposition Plan  Time Spent in minutes   35 minutes  Procedures:  None Consultants:   Nephrology  Antimicrobials:      Medications  Scheduled Meds: . amiodarone  200 mg Oral Daily  . amLODipine  5 mg Oral Daily  . apixaban  5 mg Oral BID  . atorvastatin  20 mg Oral QHS  . clopidogrel  75 mg Oral Daily  . insulin aspart  0-5 Units Subcutaneous QHS  . insulin aspart  0-9 Units Subcutaneous TID WC  .  iopamidol      . isosorbide mononitrate  60 mg Oral Daily  . loratadine  10 mg Oral Daily  . metoprolol tartrate  25 mg Oral BID  . multivitamin with minerals  1 tablet Oral QHS  . pantoprazole  40 mg Oral BID   Continuous Infusions: . sodium chloride 100 mL/hr at 10/16/17 0700   PRN Meds:.acetaminophen **OR** acetaminophen, benzonatate, HYDROcodone-homatropine, nitroGLYCERIN, ondansetron (ZOFRAN) IV, ondansetron (ZOFRAN) IV   Antibiotics   Anti-infectives (From admission, onward)   None        Subjective:   Gregory Bates was seen and examined today.  Patient denies dizziness, chest pain, shortness of breath, abdominal pain, N/V/D/C, new weakness, numbess, tingling.  No fevers.  Objective:   Vitals:   10/16/17 0600 10/16/17 0700 10/16/17 1000  10/16/17 1056  BP: (!) 126/59 (!) 123/55 (!) 136/56 121/73  Pulse: (!) 55 (!) 55 (!) 56 (!) 57  Resp: 13 14 12 13   Temp:      TempSrc:      SpO2: 99% 96% 97% 99%  Weight:      Height:        Intake/Output Summary (Last 24 hours) at 10/16/2017 1257 Last data filed at 10/16/2017 4235 Gross per 24 hour  Intake 260 ml  Output -  Net 260 ml     Wt Readings from Last 3 Encounters:  10/15/17 86.2 kg (190 lb)  10/08/17 88.4 kg (194 lb 12.8 oz)  06/21/17 85.6 kg (188 lb 12.8 oz)     Exam  General: Alert and oriented x 3, NAD  Eyes:   HEENT:  Atraumatic, normocephalic  Cardiovascular: S1 S2 auscultated, no rubs, murmurs or gallops. Regular rate and rhythm.  Respiratory: Clear to auscultation bilaterally, no wheezing, rales or rhonchi  Gastrointestinal: Soft, nontender, nondistended, + bowel sounds  Ext: no pedal edema bilaterally  Neuro no new deficit  Musculoskeletal: No digital cyanosis, clubbing  Skin: No rashes  Psych: Normal affect and demeanor, alert and oriented x3    Data Reviewed:  I have personally reviewed following labs and imaging studies  Micro Results No results found for this or any previous visit (from the past 240 hour(s)).  Radiology Reports Ct Abdomen Pelvis Wo Contrast  Result Date: 10/16/2017 CLINICAL DATA:  Acute onset of nausea and vomiting. EXAM: CT ABDOMEN AND PELVIS WITHOUT CONTRAST TECHNIQUE: Multidetector CT imaging of the abdomen and pelvis was performed following the standard protocol without IV contrast. COMPARISON:  Abdominal radiograph performed 10/15/2017 FINDINGS: Lower chest: The visualized lung bases are clear. Diffuse coronary artery calcifications are seen. Hepatobiliary: The liver is unremarkable in appearance. The gallbladder is unremarkable in appearance. The common bile duct remains normal in caliber. Pancreas: The pancreas is within normal limits. Spleen: The spleen is unremarkable in appearance. Adrenals/Urinary Tract:  The adrenal glands are unremarkable in appearance. Mild nonspecific perinephric stranding is noted bilaterally. A right renal cyst is noted. There is no evidence of hydronephrosis. No renal or ureteral stones are identified. Stomach/Bowel: The stomach is unremarkable in appearance. The small bowel is within normal limits. The appendix is normal in caliber, without evidence of appendicitis. Mild diverticulosis is noted along the sigmoid colon, without evidence of diverticulitis. Vascular/Lymphatic: There is aneurysmal dilatation of the infrarenal abdominal aorta to 3.9 cm in AP dimension, resolving proximal to the aortic bifurcation. Diffuse calcification is seen along the abdominal aorta and its branches. No retroperitoneal or pelvic sidewall lymphadenopathy is seen. Reproductive: The bladder is significantly distended and grossly unremarkable.  The prostate is normal in size. Other: A 2.2 cm nonspecific subcutaneous soft tissue nodule is noted at the left inguinal region. Musculoskeletal: No acute osseous abnormalities are identified. The visualized musculature is unremarkable in appearance. IMPRESSION: 1. No acute abnormality seen within the abdomen or pelvis. 2. **An incidental finding of potential clinical significance has been found. Aneurysmal dilatation of the infrarenal abdominal aorta to 3.9 cm in AP dimension. Recommend followup by ultrasound in 2 years. This recommendation follows ACR consensus guidelines: White Paper of the ACR Incidental Findings Committee II on Vascular Findings. J Am Coll Radiol 2013; 10:789-794.** 3. Diffuse coronary artery calcifications seen. 4. Right renal cyst noted. 5. Mild diverticulosis along the sigmoid colon, without evidence of diverticulitis. 6. 2.2 cm nonspecific subcutaneous soft tissue nodule at the left inguinal region. Aortic Atherosclerosis (ICD10-I70.0). Electronically Signed   By: Garald Balding M.D.   On: 10/16/2017 05:59   Dg Chest 2 View  Result Date:  10/16/2017 CLINICAL DATA:  Acute onset of nausea and vomiting. EXAM: CHEST  2 VIEW COMPARISON:  Chest radiograph performed 04/17/2017 FINDINGS: The lungs are well-aerated and clear. There is no evidence of focal opacification, pleural effusion or pneumothorax. The heart is normal in size; the mediastinal contour is within normal limits. No acute osseous abnormalities are seen. IMPRESSION: No acute cardiopulmonary process seen. Electronically Signed   By: Garald Balding M.D.   On: 10/16/2017 04:07   Dg Abd 2 Views  Result Date: 10/16/2017 CLINICAL DATA:  Acute onset of nausea and vomiting. EXAM: ABDOMEN - 2 VIEW COMPARISON:  None. FINDINGS: The visualized bowel gas pattern is unremarkable. Scattered air and stool filled loops of colon are seen; no abnormal dilatation of small bowel loops is seen to suggest small bowel obstruction. No free intra-abdominal air is identified, though evaluation for free air is limited on a single supine view. The visualized osseous structures are within normal limits; the sacroiliac joints are unremarkable in appearance. The visualized lung bases are essentially clear. IMPRESSION: Unremarkable bowel gas pattern; no free intra-abdominal air seen. Moderate amount of stool noted in the colon. Electronically Signed   By: Garald Balding M.D.   On: 10/16/2017 00:29    Lab Data:  CBC: Recent Labs  Lab 10/15/17 1915 10/16/17 0408  WBC 9.5 8.2  HGB 11.9* 10.0*  HCT 34.0* 28.9*  MCV 81.1 81.2  PLT 262 361   Basic Metabolic Panel: Recent Labs  Lab 10/14/17 0728 10/15/17 1915 10/16/17 0408 10/16/17 0816 10/16/17 1152  NA 117* 113* 117* 115* 115*  K 4.8 5.0 4.2 4.3 4.3  CL 83* 83* 88* 87* 86*  CO2 24 20* 21* 21* 22  GLUCOSE 114* 150* 85 92 99  BUN 9 9 8 8 8   CREATININE 1.20 1.20 1.16 1.10 1.19  CALCIUM 9.0 9.2 8.4* 8.4* 8.0*   GFR: Estimated Creatinine Clearance: 56.2 mL/min (by C-G formula based on SCr of 1.19 mg/dL). Liver Function Tests: Recent Labs  Lab  10/15/17 1915 10/16/17 0408  AST 44* 36  ALT 55 44  ALKPHOS 77 63  BILITOT 0.7 0.5  PROT 7.3 5.7*  ALBUMIN 4.2 3.4*   Recent Labs  Lab 10/15/17 1915  LIPASE 44   No results for input(s): AMMONIA in the last 168 hours. Coagulation Profile: No results for input(s): INR, PROTIME in the last 168 hours. Cardiac Enzymes: Recent Labs  Lab 10/15/17 2327 10/16/17 0408 10/16/17 1128  TROPONINI <0.03 <0.03 <0.03   BNP (last 3 results) No results for input(s): PROBNP  in the last 8760 hours. HbA1C: No results for input(s): HGBA1C in the last 72 hours. CBG: Recent Labs  Lab 10/16/17 0829  GLUCAP 91   Lipid Profile: No results for input(s): CHOL, HDL, LDLCALC, TRIG, CHOLHDL, LDLDIRECT in the last 72 hours. Thyroid Function Tests: Recent Labs    10/15/17 2327  TSH 0.628   Anemia Panel: No results for input(s): VITAMINB12, FOLATE, FERRITIN, TIBC, IRON, RETICCTPCT in the last 72 hours. Urine analysis:    Component Value Date/Time   COLORURINE YELLOW 10/16/2017 0548   APPEARANCEUR HAZY (A) 10/16/2017 0548   LABSPEC 1.010 10/16/2017 0548   PHURINE 7.0 10/16/2017 0548   GLUCOSEU NEGATIVE 10/16/2017 0548   HGBUR NEGATIVE 10/16/2017 0548   BILIRUBINUR NEGATIVE 10/16/2017 0548   KETONESUR NEGATIVE 10/16/2017 0548   PROTEINUR NEGATIVE 10/16/2017 0548   NITRITE NEGATIVE 10/16/2017 0548   LEUKOCYTESUR NEGATIVE 10/16/2017 0548     Lavon Bothwell M.D. [facility] 10/16/2017, 12:57 PM  Pager: [phone] Between 7am to 7pm - call Pager - [phone]  After 7pm go to [email or web address] - password [email or web address]  Call night coverage person covering after 7pm

## 2017-10-16 NOTE — ED Notes (Signed)
hospitalist rounding at bedside

## 2017-10-16 NOTE — ED Notes (Addendum)
Dr. Maudie Mercury notified of serum osmolality of 244.  No new orders at this time.

## 2017-10-16 NOTE — ED Notes (Signed)
Patient transported to X-ray 

## 2017-10-16 NOTE — Progress Notes (Signed)
CRITICAL VALUE ALERT  Critical Value:  NA 112  Date & Time Notied:  10/16/2017 2000  Provider Notified: Bodenheimer  Orders Received/Actions taken: no new orders received

## 2017-10-17 ENCOUNTER — Encounter (HOSPITAL_COMMUNITY): Payer: Self-pay | Admitting: *Deleted

## 2017-10-17 LAB — BASIC METABOLIC PANEL
ANION GAP: 8 (ref 5–15)
Anion gap: 9 (ref 5–15)
Anion gap: 6 (ref 5–15)
Anion gap: 8 (ref 5–15)
BUN: 8 mg/dL (ref 6–20)
BUN: 7 mg/dL (ref 6–20)
BUN: 6 mg/dL (ref 6–20)
BUN: 7 mg/dL (ref 6–20)
CALCIUM: 8.2 mg/dL — AB (ref 8.9–10.3)
CALCIUM: 8.3 mg/dL — AB (ref 8.9–10.3)
CHLORIDE: 87 mmol/L — AB (ref 101–111)
CHLORIDE: 86 mmol/L — AB (ref 101–111)
CO2: 20 mmol/L — AB (ref 22–32)
CO2: 19 mmol/L — AB (ref 22–32)
CO2: 21 mmol/L — ABNORMAL LOW (ref 22–32)
CO2: 21 mmol/L — ABNORMAL LOW (ref 22–32)
CREATININE: 1.2 mg/dL (ref 0.61–1.24)
CREATININE: 1.21 mg/dL (ref 0.61–1.24)
Calcium: 8.2 mg/dL — ABNORMAL LOW (ref 8.9–10.3)
Calcium: 8.6 mg/dL — ABNORMAL LOW (ref 8.9–10.3)
Chloride: 87 mmol/L — ABNORMAL LOW (ref 101–111)
Chloride: 84 mmol/L — ABNORMAL LOW (ref 101–111)
Creatinine, Ser: 1.11 mg/dL (ref 0.61–1.24)
Creatinine, Ser: 1.27 mg/dL — ABNORMAL HIGH (ref 0.61–1.24)
GFR calc Af Amer: >60 mL/min (ref >60)
GFR calc Af Amer: >60 mL/min (ref >60)
GFR calc Af Amer: >60 mL/min (ref >60)
GFR calc non Af Amer: >60 mL/min (ref >60)
GFR calc non Af Amer: 57 mL/min — ABNORMAL LOW (ref >60)
GFR, EST NON AFRICAN AMERICAN: 53 mL/min — AB (ref >60)
GFR, EST NON AFRICAN AMERICAN: 56 mL/min — AB (ref >60)
GLUCOSE: 89 mg/dL (ref 65–99)
Glucose, Bld: 169 mg/dL — ABNORMAL HIGH (ref 65–99)
Glucose, Bld: 118 mg/dL — ABNORMAL HIGH (ref 65–99)
Glucose, Bld: 138 mg/dL — ABNORMAL HIGH (ref 65–99)
POTASSIUM: 4.5 mmol/L (ref 3.5–5.1)
POTASSIUM: 4.5 mmol/L (ref 3.5–5.1)
POTASSIUM: 4.2 mmol/L (ref 3.5–5.1)
Potassium: 4.5 mmol/L (ref 3.5–5.1)
SODIUM: 114 mmol/L — AB (ref 135–145)
SODIUM: 113 mmol/L — AB (ref 135–145)
Sodium: 115 mmol/L — CL (ref 135–145)
Sodium: 114 mmol/L — CL (ref 135–145)

## 2017-10-17 LAB — GLUCOSE, CAPILLARY
GLUCOSE-CAPILLARY: 92 mg/dL (ref 65–99)
GLUCOSE-CAPILLARY: 93 mg/dL (ref 65–99)
GLUCOSE-CAPILLARY: 133 mg/dL — AB (ref 65–99)

## 2017-10-17 LAB — CBC
HEMATOCRIT: 30.3 % — AB (ref 39.0–52.0)
HEMOGLOBIN: 10.6 g/dL — AB (ref 13.0–17.0)
MCH: 28.6 pg (ref 26.0–34.0)
MCHC: 35 g/dL (ref 30.0–36.0)
MCV: 81.9 fL (ref 78.0–100.0)
Platelets: 214 10*3/uL (ref 150–400)
RBC: 3.7 MIL/uL — ABNORMAL LOW (ref 4.22–5.81)
RDW: 12.7 % (ref 11.5–15.5)
WBC: 9.1 10*3/uL (ref 4.0–10.5)

## 2017-10-17 LAB — HEMOGLOBIN A1C
Hgb A1c MFr Bld: 6.3 % — ABNORMAL HIGH (ref 4.8–5.6)
Mean Plasma Glucose: 134 mg/dL

## 2017-10-17 LAB — SODIUM, URINE, RANDOM: Sodium, Ur: 80 mmol/L

## 2017-10-17 LAB — OSMOLALITY, URINE: Osmolality, Ur: 362 mOsm/kg (ref 300–900)

## 2017-10-17 LAB — OSMOLALITY: OSMOLALITY: 250 mosm/kg — AB (ref 275–295)

## 2017-10-17 MED ORDER — SODIUM CHLORIDE 0.9% FLUSH
3.0000 mL | INTRAVENOUS | Status: DC | PRN
  Administered 2017-10-17: 3 mL via INTRAVENOUS
  Filled 2017-10-17: qty 3

## 2017-10-17 MED ORDER — SODIUM CHLORIDE 1 G PO TABS
2.0000 g | ORAL_TABLET | Freq: Three times a day (TID) | ORAL | Status: DC
  Filled 2017-10-17: qty 2

## 2017-10-17 MED ORDER — ASPIRIN 81 MG PO CHEW
81.0000 mg | CHEWABLE_TABLET | ORAL | Status: AC
  Filled 2017-10-17: qty 1

## 2017-10-17 MED ORDER — SODIUM CHLORIDE 0.9% FLUSH
3.0000 mL | Freq: Two times a day (BID) | INTRAVENOUS | Status: DC
  Administered 2017-10-18 (×2): 3 mL via INTRAVENOUS

## 2017-10-17 MED ORDER — SODIUM CHLORIDE 1 G PO TABS
2.0000 g | ORAL_TABLET | Freq: Three times a day (TID) | ORAL | Status: DC
  Administered 2017-10-17 – 2017-10-18 (×3): 2 g via ORAL
  Filled 2017-10-17 (×3): qty 2

## 2017-10-17 MED ORDER — SODIUM CHLORIDE 0.9 % IV SOLN: 250.0000 mL | INTRAVENOUS | Status: DC | PRN

## 2017-10-17 MED ORDER — SODIUM CHLORIDE 0.9 % IV SOLN
INTRAVENOUS | Status: DC
  Administered 2017-10-17: 07:00:00 via INTRAVENOUS

## 2017-10-17 NOTE — Progress Notes (Signed)
CRITICAL VALUE ALERT  Critical Value:  Na 113  Date & Time Notied:  10/17/17 2030  Provider Notified: Bodenheimer  Orders Received/Actions taken: No new orders received at the moment.

## 2017-10-17 NOTE — Progress Notes (Signed)
Pt continues to c/o of persistent cough and difficulty in passing urine.

## 2017-10-17 NOTE — Progress Notes (Signed)
[facility]                                                                              Patient Demographics  Gregory Bates, is a 76 y.o. male, DOB - 1941-09-17, WUX:[id number]  Admit date - 10/15/2017   Admitting Physician Jani Gravel, MD  Outpatient Primary MD for the patient is Cyndy Freeze, MD  Outpatient specialists:   LOS - 2  days   Medical records reviewed and are as summarized below:    Chief Complaint  Patient presents with  . Emesis       Brief summary   Per admit note by Dr. Maudie Mercury on 12/14 Gregory Bates  is a 76 y.o. male, w Pafib , CAD s/p stent, CHF (40-45%) , Hypertension, Hyperlipidemia, Dm2, apparently had n/v after eating cheeze sandwich.  Pt having n/v since Wednesday.   Slight dry cough since Wednesday.  Pt denied fever, chill, cp, palp, sob beyond baseline ,abd pain, diarrhea, brbpr.  Pt was brought to ED for evaluation of low sodium which was found by his pcp. Pt was taken off lasix on Wednesday.   Assessment & Plan    Principal Problem: Acute hyponatremia -Unclear etiology, also having nausea and vomiting for last 3 days, presented with sodium of 113.  Not symptomatic, patient alert and oriented. -  Serum osmolarity low 244, urine osmolarity 389, urine sodium 94 on admission -Placed on gentle hydration however no significant improvement in the sodium level, repeat serum osmolarity today 250 -Nephrology consult requested, discussed with Dr. Melvia Heaps, will see today  Active Problems:   Nausea & vomiting -Unclear etiology, CT abdomen pelvis showed no acute abnormality.  Aneurysmal dilatation of infrarenal abdominal aorta 3.9 cm seen incidentally -No vomiting today, advance diet to solids  Infrarenal abdominal aorta aneurysm -Incidentally CT abdomen showed aneurysmal dilatation of infrarenal abdominal aorta to 3.9 cm, recommended follow-up ultrasound in 2 years.  Chronic combined systolic and diastolic CHF -2D echo 2/53, showed EF of  45%-50%, akinesis and scarring of the basal mid inferior myocardium, grade 2 diastolic dysfunction -Currently holding Lasix since 12/12 and lisinopril placed on hold on 12/15. -Continue metoprolol, Plavix, Imdur, amlodipine, Lipitor - There was a plan of cardiac cath outpatient, discussed with Dr. Domenic Polite (on-call cardiology), cath has been rescheduled to November 12, 2017, relayed to patient and his wife.   Paroxysmal atrial fibrillation -Currently rate controlled, continue metoprolol, amiodarone -Mali vasc 4, Continue Eliquis for anticoagulation  Diabetes mellitus -CBG stable, continue sliding scale insulin   Mechanical fall -Patient had mechanical fall, notified by RN, had hit his head on the table, CT head negative  Code Status: Full code DVT Prophylaxis: Apixaban Family Communication: Discussed in detail with the patient, all imaging results, lab results explained to the patient and wife at the bedside.   Disposition Plan  Time Spent in minutes   35 minutes  Procedures:  None  Consultants:   Nephrology  Antimicrobials:      Medications  Scheduled Meds: . amiodarone  200 mg Oral Daily  . amLODipine  5 mg Oral Daily  . apixaban  5 mg Oral BID  . [START  ON 10/18/2017] aspirin  81 mg Oral Pre-Cath  . atorvastatin  20 mg Oral QHS  . clopidogrel  75 mg Oral Daily  . insulin aspart  0-5 Units Subcutaneous QHS  . insulin aspart  0-9 Units Subcutaneous TID WC  . isosorbide mononitrate  60 mg Oral Daily  . loratadine  10 mg Oral Daily  . metoprolol tartrate  25 mg Oral BID  . multivitamin with minerals  1 tablet Oral QHS  . pantoprazole  40 mg Oral BID  . pneumococcal 23 valent vaccine  0.5 mL Intramuscular Tomorrow-1000  . sodium chloride flush  3 mL Intravenous Q12H   Continuous Infusions: . sodium chloride 75 mL/hr at 10/17/17 0800  . sodium chloride    . sodium chloride 75 mL/hr at 10/17/17 0634   PRN Meds:.sodium chloride, acetaminophen **OR** acetaminophen,  benzonatate, HYDROcodone-homatropine, nitroGLYCERIN, ondansetron (ZOFRAN) IV, sodium chloride flush   Antibiotics   Anti-infectives (From admission, onward)   None        Subjective:   Gregory Bates was seen and examined today.  No complaints by the patient, alert and oriented.  Hoping to get some solid food today.  Wife at the bedside.  Patient denies dizziness, chest pain, shortness of breath, abdominal pain, vomiting, diarrhea, new weakness, numbess, tingling.  No fevers.  Objective:   Vitals:   10/17/17 0000 10/17/17 0250 10/17/17 0400 10/17/17 0758  BP:  (!) 141/64  (!) 127/58  Pulse: (!) 52 (!) 53 70 (!) 59  Resp: 13 12 16 19   Temp:  98 F (36.7 C)  97.9 F (36.6 C)  TempSrc:  Oral  Oral  SpO2: 95% 100% 94% 100%  Weight:  85.7 kg (188 lb 15 oz)    Height:        Intake/Output Summary (Last 24 hours) at 10/17/2017 1130 Last data filed at 10/17/2017 0800 Gross per 24 hour  Intake 3104.17 ml  Output 200 ml  Net 2904.17 ml     Wt Readings from Last 3 Encounters:  10/17/17 85.7 kg (188 lb 15 oz)  10/08/17 88.4 kg (194 lb 12.8 oz)  06/21/17 85.6 kg (188 lb 12.8 oz)     Exam   General: Alert and oriented x 3, NAD  Eyes:   HEENT:  Atraumatic, normocephalic  Cardiovascular: S1 S2 auscultated, no rubs, murmurs or gallops. Regular rate and rhythm. No pedal edema b/l  Respiratory: Clear to auscultation bilaterally, no wheezing, rales or rhonchi  Gastrointestinal: Soft, nontender, nondistended, + bowel sounds  Ext: no pedal edema bilaterally  Neuro: AAOx3, Cr N's II- XII. Strength 5/5 upper and lower extremities bilaterally, speech clear, sensations grossly intact  Musculoskeletal: No digital cyanosis, clubbing  Skin: No rashes  Psych: Normal affect and demeanor, alert and oriented x3    Data Reviewed:  I have personally reviewed following labs and imaging studies  Micro Results Recent Results (from the past 240 hour(s))  MRSA PCR Screening      Status: None   Collection Time: 10/16/17  2:40 PM  Result Value Ref Range Status   MRSA by PCR NEGATIVE NEGATIVE Final    Comment:        The GeneXpert MRSA Assay (FDA approved for NASAL specimens only), is one component of a comprehensive MRSA colonization surveillance program. It is not intended to diagnose MRSA infection nor to guide or monitor treatment for MRSA infections.     Radiology Reports Ct Abdomen Pelvis Wo Contrast  Result Date: 10/16/2017 CLINICAL DATA:  Acute onset  of nausea and vomiting. EXAM: CT ABDOMEN AND PELVIS WITHOUT CONTRAST TECHNIQUE: Multidetector CT imaging of the abdomen and pelvis was performed following the standard protocol without IV contrast. COMPARISON:  Abdominal radiograph performed 10/15/2017 FINDINGS: Lower chest: The visualized lung bases are clear. Diffuse coronary artery calcifications are seen. Hepatobiliary: The liver is unremarkable in appearance. The gallbladder is unremarkable in appearance. The common bile duct remains normal in caliber. Pancreas: The pancreas is within normal limits. Spleen: The spleen is unremarkable in appearance. Adrenals/Urinary Tract: The adrenal glands are unremarkable in appearance. Mild nonspecific perinephric stranding is noted bilaterally. A right renal cyst is noted. There is no evidence of hydronephrosis. No renal or ureteral stones are identified. Stomach/Bowel: The stomach is unremarkable in appearance. The small bowel is within normal limits. The appendix is normal in caliber, without evidence of appendicitis. Mild diverticulosis is noted along the sigmoid colon, without evidence of diverticulitis. Vascular/Lymphatic: There is aneurysmal dilatation of the infrarenal abdominal aorta to 3.9 cm in AP dimension, resolving proximal to the aortic bifurcation. Diffuse calcification is seen along the abdominal aorta and its branches. No retroperitoneal or pelvic sidewall lymphadenopathy is seen. Reproductive: The bladder is  significantly distended and grossly unremarkable. The prostate is normal in size. Other: A 2.2 cm nonspecific subcutaneous soft tissue nodule is noted at the left inguinal region. Musculoskeletal: No acute osseous abnormalities are identified. The visualized musculature is unremarkable in appearance. IMPRESSION: 1. No acute abnormality seen within the abdomen or pelvis. 2. **An incidental finding of potential clinical significance has been found. Aneurysmal dilatation of the infrarenal abdominal aorta to 3.9 cm in AP dimension. Recommend followup by ultrasound in 2 years. This recommendation follows ACR consensus guidelines: White Paper of the ACR Incidental Findings Committee II on Vascular Findings. J Am Coll Radiol 2013; 10:789-794.** 3. Diffuse coronary artery calcifications seen. 4. Right renal cyst noted. 5. Mild diverticulosis along the sigmoid colon, without evidence of diverticulitis. 6. 2.2 cm nonspecific subcutaneous soft tissue nodule at the left inguinal region. Aortic Atherosclerosis (ICD10-I70.0). Electronically Signed   By: Garald Balding M.D.   On: 10/16/2017 05:59   Dg Chest 2 View  Result Date: 10/16/2017 CLINICAL DATA:  Acute onset of nausea and vomiting. EXAM: CHEST  2 VIEW COMPARISON:  Chest radiograph performed 04/17/2017 FINDINGS: The lungs are well-aerated and clear. There is no evidence of focal opacification, pleural effusion or pneumothorax. The heart is normal in size; the mediastinal contour is within normal limits. No acute osseous abnormalities are seen. IMPRESSION: No acute cardiopulmonary process seen. Electronically Signed   By: Garald Balding M.D.   On: 10/16/2017 04:07   Ct Head Wo Contrast  Result Date: 10/16/2017 CLINICAL DATA:  Fall, hit head EXAM: CT HEAD WITHOUT CONTRAST TECHNIQUE: Contiguous axial images were obtained from the base of the skull through the vertex without intravenous contrast. COMPARISON:  10/31/2003 FINDINGS: Brain: Calcified mass in the left  temporal region measures 2.3 x 1.8 cm most compatible with calcified meningioma. There is atrophy and chronic small vessel disease changes. No hemorrhage, hydrocephalus or acute infarction. Vascular: No hyperdense vessel or unexpected calcification. Skull: No acute calvarial abnormality. Sinuses/Orbits: No acute finding Other: None IMPRESSION: Left temporal calcified mass compatible with meningioma. No acute intracranial abnormality. Atrophy, chronic microvascular disease. Electronically Signed   By: Rolm Baptise M.D.   On: 10/16/2017 15:58   Dg Abd 2 Views  Result Date: 10/16/2017 CLINICAL DATA:  Acute onset of nausea and vomiting. EXAM: ABDOMEN - 2 VIEW COMPARISON:  None.  FINDINGS: The visualized bowel gas pattern is unremarkable. Scattered air and stool filled loops of colon are seen; no abnormal dilatation of small bowel loops is seen to suggest small bowel obstruction. No free intra-abdominal air is identified, though evaluation for free air is limited on a single supine view. The visualized osseous structures are within normal limits; the sacroiliac joints are unremarkable in appearance. The visualized lung bases are essentially clear. IMPRESSION: Unremarkable bowel gas pattern; no free intra-abdominal air seen. Moderate amount of stool noted in the colon. Electronically Signed   By: Garald Balding M.D.   On: 10/16/2017 00:29    Lab Data:  CBC: Recent Labs  Lab 10/15/17 1915 10/16/17 0408 10/17/17 0853  WBC 9.5 8.2 9.1  HGB 11.9* 10.0* 10.6*  HCT 34.0* 28.9* 30.3*  MCV 81.1 81.2 81.9  PLT 262 218 010   Basic Metabolic Panel: Recent Labs  Lab 10/16/17 0816 10/16/17 1152 10/16/17 1832 10/17/17 0212 10/17/17 0853  NA 115* 115* 112* 115* 114*  K 4.3 4.3 4.3 4.5 4.5  CL 87* 86* 84* 87* 86*  CO2 21* 22 21* 20* 19*  GLUCOSE 92 99 151* 89 169*  BUN 8 8 9 8 7   CREATININE 1.10 1.19 1.19 1.11 1.20  CALCIUM 8.4* 8.0* 8.0* 8.2* 8.6*   GFR: Estimated Creatinine Clearance: 55.8 mL/min  (by C-G formula based on SCr of 1.2 mg/dL). Liver Function Tests: Recent Labs  Lab 10/15/17 1915 10/16/17 0408  AST 44* 36  ALT 55 44  ALKPHOS 77 63  BILITOT 0.7 0.5  PROT 7.3 5.7*  ALBUMIN 4.2 3.4*   Recent Labs  Lab 10/15/17 1915  LIPASE 44   No results for input(s): AMMONIA in the last 168 hours. Coagulation Profile: No results for input(s): INR, PROTIME in the last 168 hours. Cardiac Enzymes: Recent Labs  Lab 10/15/17 2327 10/16/17 0408 10/16/17 1128  TROPONINI <0.03 <0.03 <0.03   BNP (last 3 results) No results for input(s): PROBNP in the last 8760 hours. HbA1C: No results for input(s): HGBA1C in the last 72 hours. CBG: Recent Labs  Lab 10/16/17 0829 10/16/17 1257 10/16/17 1632 10/16/17 2131 10/17/17 0752  GLUCAP 91 91 92 93 92   Lipid Profile: No results for input(s): CHOL, HDL, LDLCALC, TRIG, CHOLHDL, LDLDIRECT in the last 72 hours. Thyroid Function Tests: Recent Labs    10/15/17 2327  TSH 0.628   Anemia Panel: No results for input(s): VITAMINB12, FOLATE, FERRITIN, TIBC, IRON, RETICCTPCT in the last 72 hours. Urine analysis:    Component Value Date/Time   COLORURINE YELLOW 10/16/2017 0548   APPEARANCEUR HAZY (A) 10/16/2017 0548   LABSPEC 1.010 10/16/2017 0548   PHURINE 7.0 10/16/2017 0548   GLUCOSEU NEGATIVE 10/16/2017 0548   HGBUR NEGATIVE 10/16/2017 0548   BILIRUBINUR NEGATIVE 10/16/2017 0548   KETONESUR NEGATIVE 10/16/2017 0548   PROTEINUR NEGATIVE 10/16/2017 0548   NITRITE NEGATIVE 10/16/2017 0548   LEUKOCYTESUR NEGATIVE 10/16/2017 0548     Cesily Cuoco M.D. [facility] 10/17/2017, 11:30 AM  Pager: [phone] Between 7am to 7pm - call Pager - [phone]  After 7pm go to [email or web address] - password [email or web address]  Call night coverage person covering after 7pm

## 2017-10-17 NOTE — Consult Note (Addendum)
Renal Service Consult Note [facility]  Gregory Bates 10/17/2017 Sol Blazing Requesting Physician:  Dr Tana Coast  Reason for Consult:  Hyponatremia HPI: The patient is a 76 y.o. year-old with hx of CAD sp cor stents, atrial flutter, ICM,  HTN, HL, DM2, combined s/d CHF (LVED 45-50% 2017) and anemia, admitted on 12/14 with N/V for 3 days.  Seen by PCP and they found a low serum Na, sent to ED.  In ED Na was 113, Cr 1.2, BUN 9, HCO3 20.  Pt was admitted and started on NS at 75 cc/hr but serum Na not improving. Asked to see for HypoNa+.   Home BP meds continued here, lopressor, norvasc and lisinopril, except the lisinopril was dc'd last night.  Urine Na was 94 and repeat was 80.  UOsm was 389 and repeat was 362.  UA was negative. CXR was negative.    Abd CT showed - no acute abnormality seen.... an incidental finding of .[name] Kitchen...aneurysmal dilatation of the infrarenal abdominal aorta to 3.9 cm..... diffuse coronary artery calcifications...renal cyst... Diverticulosis.   Pt's wife gives most of history, since September he has been struggling with nausea and vomiting, intermittent, no abd pain or diarrhea.  They saw PCP initially and they put him on po abx and 3 other prn's and he didn't improve. They saw PCP again and he was set up for GI eval / EGD which is on schedule for tomorrow.  Also c/o coughing at night. No CP, fevers or sweats.  He used to drink a lot of water but they cut him back to 2 liters from 8 liters per day.  He doesn't eat a lot of salt.  No hx thyroid or adrenal disease.    Una 80- 94 Uosm 360- 390  Home meds: -norvasc 5 qd/ lisinopril 20 qd / metoprolol 25 bid/ lasix 20 qd -amio 200 qd/ lipitor 20 / plavix 75 / eliquis 5 bid/ imdur 60/ sl ntg prn -MVI/ allegra/ vit D/ KCl/ zofran prn   ROS  denies CP  no joint pain   no HA  no blurry vision  no rash  no diarrhea  no nausea/ vomiting  no dysuria  no difficulty voiding  no change in urine color    Past  Medical History  Past Medical History:  Diagnosis Date  . Acute renal insufficiency    a. During 11/2013 adm with atrial flutter.  . Anemia, unspecified   . CAD (coronary artery disease), native coronary artery    a. s/p cath May 2011 >> DES distal RCA;  b. 01/2013 NSTEMI >> OM1 99p (2.75x12 Promus Premier DES);  c. LHC (2/15): PCI: Promus DES to dist RCA ;  d. LHC 1/16 - dLM 43, oLAD 30, small D1 tandem 74, pOM1 stent ok, mOM 50-60, dOM bifurcation 50-60 in both vessels, AV 37-70 jailed by stent, pRCA 58 mRCA stent ok, dRCA stent ok, PLB smal 50, dPDA 60, 80; EF 35-40 >> med Rx - PCI of PDA if angina  . Chronic combined systolic and diastolic CHF (congestive heart failure) ([place]) 12/23/2015   Echo 2/17: Mild LVH, EF 45-50%, inferior akinesis, grade 2 diastolic dysfunction, mild AI, mild MR, mild LAE, PASP 44 mmHg   . Colorectal polyps 2002  . Diabetes mellitus    AODM  . Erectile dysfunction   . History of echocardiogram    a. 01/2013 Echo: EF 55%, mid-dist inflat HK, Gr1 DD, Triv AI/TR, Mild MR.;  b.  Echo (1/15): Mild  LVH, EF 55%, inferolateral hypokinesis, grade 1 diastolic dysfunction, mild AI, trivial MR, moderate LAE, PASP 36 mmHg   . Hx of cardiovascular stress test    Stress myoview 08/07/13 with LVEF 44%, inferior scar, no ischemia  . Hyperlipidemia   . Hypertension   . Ischemic cardiomyopathy    a. EF 35-40% by LHC in 1/16; b. Echo 2/17: Mild LVH, EF 45-50%, inferior akinesis, grade 2 diastolic dysfunction, mild AI, mild MR, mild LAE, PASP 44 mmHg  . Myocardial infarction ([place])   . PAF (paroxysmal atrial fibrillation) ([place])    a. Dx 11/2013. Spont conv. Placed on eliquis.   Past Surgical History  Past Surgical History:  Procedure Laterality Date  . COLONOSCOPY W/ POLYPECTOMY  2002   [facility]  . COLONOSCOPY W/ POLYPECTOMY  2004; 2007 negative  . CORONARY ANGIOPLASTY  12/26/2013  . CORONARY STENT PLACEMENT  2007, 2011  . infantile paralysis facial asymmetry    . LEFT HEART  CATH AND CORONARY ANGIOGRAPHY N/A 05/18/2017   Procedure: Left Heart Cath and Coronary Angiography;  Surgeon: Martinique, Peter M, MD;  Location: [place] [facility];  Service: Cardiovascular;  Laterality: N/A;  . LEFT HEART CATHETERIZATION WITH CORONARY ANGIOGRAM N/A 10/02/2012   Procedure: LEFT HEART CATHETERIZATION WITH CORONARY ANGIOGRAM;  Surgeon: Peter M Martinique, MD;  Location: [facility] CATH LAB;  Service: Cardiovascular;  Laterality: N/A;  . LEFT HEART CATHETERIZATION WITH CORONARY ANGIOGRAM N/A 02/20/2013   Procedure: LEFT HEART CATHETERIZATION WITH CORONARY ANGIOGRAM;  Surgeon: Burnell Blanks, MD;  Location: [facility] CATH LAB;  Service: Cardiovascular;  Laterality: N/A;  . LEFT HEART CATHETERIZATION WITH CORONARY ANGIOGRAM N/A 12/26/2013   Procedure: LEFT HEART CATHETERIZATION WITH CORONARY ANGIOGRAM;  Surgeon: Burnell Blanks, MD;  Location: [facility] CATH LAB;  Service: Cardiovascular;  Laterality: N/A;  . LEFT HEART CATHETERIZATION WITH CORONARY ANGIOGRAM N/A 11/28/2014   Procedure: LEFT HEART CATHETERIZATION WITH CORONARY ANGIOGRAM;  Surgeon: Burnell Blanks, MD;  Location: [facility] CATH LAB;  Service: Cardiovascular;  Laterality: N/A;  . PERCUTANEOUS CORONARY STENT INTERVENTION (PCI-S)  10/02/2012   Procedure: PERCUTANEOUS CORONARY STENT INTERVENTION (PCI-S);  Surgeon: Peter M Martinique, MD;  Location: [facility] CATH LAB;  Service: Cardiovascular;;  . PERCUTANEOUS CORONARY STENT INTERVENTION (PCI-S)  02/20/2013   Procedure: PERCUTANEOUS CORONARY STENT INTERVENTION (PCI-S);  Surgeon: Burnell Blanks, MD;  Location: [facility] CATH LAB;  Service: Cardiovascular;;  . PERCUTANEOUS CORONARY STENT INTERVENTION (PCI-S)  12/26/2013   Procedure: PERCUTANEOUS CORONARY STENT INTERVENTION (PCI-S);  Surgeon: Burnell Blanks, MD;  Location: [facility] CATH LAB;  Service: Cardiovascular;;  . WISDOM TOOTH EXTRACTION     Family History  Family History  Problem Relation Age of Onset  . Heart attack Brother 8  . Colon cancer  Brother   . Diabetes Neg Hx   . Stroke Neg Hx   . Hypertension Neg Hx    Social History  reports that he quit smoking about 32 years ago. he has never used smokeless tobacco. He reports that he does not drink alcohol or use drugs. Allergies  Allergies  Allergen Reactions  . Brilinta [Ticagrelor] Other (See Comments)    Arthralgias & myalgias  . Crestor [Rosuvastatin] Other (See Comments)    Myalgias   Home medications Prior to Admission medications   Medication Sig Start Date End Date Taking? Authorizing Provider  amiodarone (PACERONE) 200 MG tablet Take 1 tablet (200 mg total) by mouth daily. 02/24/17  Yes Burnell Blanks, MD  amLODipine (NORVASC) 5 MG tablet TAKE 1 TABLET EVERY  DAY 08/20/17  Yes Weaver, Scott T, PA-C  atorvastatin (LIPITOR) 20 MG tablet Take 20 mg by mouth at bedtime.   Yes [provider]  Chelated Potassium 99 MG TABS Take 99 mg by mouth daily as needed (supplementation).   Yes [provider]  cholecalciferol (VITAMIN D) 1000 UNITS tablet Take 1,000 Units by mouth every evening.    Yes [provider]  clopidogrel (PLAVIX) 75 MG tablet Take 1 tablet (75 mg total) by mouth daily. 06/21/17  Yes Weaver, Scott T, PA-C  ELIQUIS 5 MG TABS tablet TAKE ONE TABLET BY MOUTH TWICE DAILY 05/03/17  Yes Burnell Blanks, MD  fexofenadine (ALLEGRA) 180 MG tablet Take 180 mg by mouth daily as needed for allergies.    Yes [provider]  furosemide (LASIX) 20 MG tablet Take 20 mg by mouth daily.   Yes [provider]  isosorbide mononitrate (IMDUR) 60 MG 24 hr tablet Take 1 tablet (60 mg total) by mouth daily. 04/20/17  Yes Bhagat, Bhavinkumar, PA  lisinopril (PRINIVIL,ZESTRIL) 20 MG tablet TAKE 1 TABLET EVERY DAY 06/01/17  Yes Burnell Blanks, MD  metoprolol tartrate (LOPRESSOR) 25 MG tablet Take 1 tablet (25 mg total) by mouth 2 (two) times daily. 04/20/17  Yes Bhagat, Bhavinkumar, PA  Multiple Vitamin (MULTIVITAMIN)  tablet Take 1 tablet by mouth at bedtime.    Yes [provider]  nitroGLYCERIN (NITROSTAT) 0.4 MG SL tablet Place 0.4 mg under the tongue every 5 (five) minutes as needed for chest pain.   Yes [provider]  Ondansetron HCl (ZOFRAN PO) Take 1 tablet by mouth every 6 (six) hours as needed (for nausea).   Yes [provider]  vitamin E 100 UNIT capsule Take 100 Units by mouth at bedtime.    Yes [provider]   Liver Function Tests Recent Labs  Lab 10/15/17 1915 10/16/17 0408  AST 44* 36  ALT 55 44  ALKPHOS 77 63  BILITOT 0.7 0.5  PROT 7.3 5.7*  ALBUMIN 4.2 3.4*   Recent Labs  Lab 10/15/17 1915  LIPASE 44   CBC Recent Labs  Lab 10/15/17 1915 10/16/17 0408 10/17/17 0853  WBC 9.5 8.2 9.1  HGB 11.9* 10.0* 10.6*  HCT 34.0* 28.9* 30.3*  MCV 81.1 81.2 81.9  PLT 262 218 734   Basic Metabolic Panel Recent Labs  Lab 10/16/17 0408 10/16/17 0816 10/16/17 1152 10/16/17 1832 10/17/17 0212 10/17/17 0853 10/17/17 1219  NA 117* 115* 115* 112* 115* 114* 114*  K 4.2 4.3 4.3 4.3 4.5 4.5 4.5  CL 88* 87* 86* 84* 87* 86* 87*  CO2 21* 21* 22 21* 20* 19* 21*  GLUCOSE 85 92 99 151* 89 169* 118*  BUN 8 8 8 9 8 7 6   CREATININE 1.16 1.10 1.19 1.19 1.11 1.20 1.27*  CALCIUM 8.4* 8.4* 8.0* 8.0* 8.2* 8.6* 8.2*   Iron/TIBC/Ferritin/ %Sat    Component Value Date/Time   IRON 86 12/04/2008 0855   IRONPCTSAT 30.1 12/04/2008 0855    Vitals:   10/17/17 0250 10/17/17 0400 10/17/17 0758 10/17/17 1200  BP: (!) 141/64  (!) 127/58 (!) 101/52  Pulse: (!) 53 70 (!) 59   Resp: 12 16 19    Temp: 98 F (36.7 C)  97.9 F (36.6 C) 98 F (36.7 C)  TempSrc: Oral  Oral   SpO2: 100% 94% 100% 100%  Weight: 85.7 kg (188 lb 15 oz)     Height:       Exam Gen alert,  up in chair, no distress, calm and pleasant No rash, cyanosis or gangrene Sclera anicteric, throat clear and moist, no thrush  No jvd or bruits, flat neck veins Chest clear bilat RRR no MRG Abd  soft ntnd no mass or ascites +bs GU normal male MS no joint effusions or deformity Ext no LE or UE edema / no wounds or ulcers Neuro is alert, Ox 3 , nf   Impression: 1. Hyponatremia - history would suggest hypovolemia but urine studies and exam suggest euvolemic.  He has rec'd 2L NS in ED and 2 more since however.  Not vol overloaded.  +hx CHF but compensated, no hx thyroid/ adrenal disease.  Was on ACEi which can contribute to hypoNa+, this was stopped yesterday.  Was on lasix but stopped by his doctor > 2 wks ago. Eats low salt, high liquid diet.  For now would treat as low solute and/ or SIADH. looks like any vol deficit has been corrected, the vomiting and low Na intake while on ACEi may be the cause.  Would recommend fluid restriction and salt tabs. Avoid ACEi/ ARB for HTN until GI issues resolved.  DC normal saline for now, may make it worse.  If not improving add low dose po lasix or consider tolvaptan.  2. CAD hx cor stents 3. CHF last EF 45-50% - compensated here 4. Recurrent N/V    Plan - will follow  Kelly Splinter MD [facility] pager [phone]   10/17/2017, 2:04 PM

## 2017-10-18 DIAGNOSIS — Z7901 Long term (current) use of anticoagulants: Secondary | ICD-10-CM

## 2017-10-18 DIAGNOSIS — R112 Nausea with vomiting, unspecified: Secondary | ICD-10-CM

## 2017-10-18 DIAGNOSIS — E871 Hypo-osmolality and hyponatremia: Principal | ICD-10-CM

## 2017-10-18 LAB — BASIC METABOLIC PANEL
ANION GAP: 9 (ref 5–15)
Anion gap: 6 (ref 5–15)
Anion gap: 6 (ref 5–15)
Anion gap: 8 (ref 5–15)
BUN: 7 mg/dL (ref 6–20)
BUN: 6 mg/dL (ref 6–20)
BUN: 9 mg/dL (ref 6–20)
BUN: 7 mg/dL (ref 6–20)
CALCIUM: 8.3 mg/dL — AB (ref 8.9–10.3)
CALCIUM: 8.7 mg/dL — AB (ref 8.9–10.3)
CALCIUM: 8.9 mg/dL (ref 8.9–10.3)
CHLORIDE: 85 mmol/L — AB (ref 101–111)
CHLORIDE: 89 mmol/L — AB (ref 101–111)
CO2: 22 mmol/L (ref 22–32)
CO2: 23 mmol/L (ref 22–32)
CO2: 24 mmol/L (ref 22–32)
CO2: 24 mmol/L (ref 22–32)
CREATININE: 1.07 mg/dL (ref 0.61–1.24)
CREATININE: 1.16 mg/dL (ref 0.61–1.24)
CREATININE: 1.28 mg/dL — AB (ref 0.61–1.24)
Calcium: 8.7 mg/dL — ABNORMAL LOW (ref 8.9–10.3)
Chloride: 86 mmol/L — ABNORMAL LOW (ref 101–111)
Chloride: 86 mmol/L — ABNORMAL LOW (ref 101–111)
Creatinine, Ser: 1.28 mg/dL — ABNORMAL HIGH (ref 0.61–1.24)
GFR calc Af Amer: >60 mL/min (ref >60)
GFR calc Af Amer: >60 mL/min (ref >60)
GFR calc Af Amer: >60 mL/min (ref >60)
GFR calc non Af Amer: 53 mL/min — ABNORMAL LOW (ref >60)
GFR calc non Af Amer: 53 mL/min — ABNORMAL LOW (ref >60)
GFR, EST NON AFRICAN AMERICAN: 59 mL/min — AB (ref >60)
GLUCOSE: 95 mg/dL (ref 65–99)
GLUCOSE: 107 mg/dL — AB (ref 65–99)
GLUCOSE: 102 mg/dL — AB (ref 65–99)
Glucose, Bld: 88 mg/dL (ref 65–99)
POTASSIUM: 4 mmol/L (ref 3.5–5.1)
Potassium: 4.9 mmol/L (ref 3.5–5.1)
Potassium: 4.3 mmol/L (ref 3.5–5.1)
Potassium: 4.6 mmol/L (ref 3.5–5.1)
SODIUM: 114 mmol/L — AB (ref 135–145)
SODIUM: 116 mmol/L — AB (ref 135–145)
Sodium: 117 mmol/L — CL (ref 135–145)
Sodium: 121 mmol/L — ABNORMAL LOW (ref 135–145)

## 2017-10-18 LAB — HEPATITIS PANEL, ACUTE
HEP A IGM: NEGATIVE
HEP B C IGM: NEGATIVE
Hepatitis B Surface Ag: NEGATIVE

## 2017-10-18 LAB — GLUCOSE, CAPILLARY
GLUCOSE-CAPILLARY: 106 mg/dL — AB (ref 65–99)
GLUCOSE-CAPILLARY: 137 mg/dL — AB (ref 65–99)
Glucose-Capillary: 104 mg/dL — ABNORMAL HIGH (ref 65–99)
Glucose-Capillary: 99 mg/dL (ref 65–99)

## 2017-10-18 LAB — CBC
HCT: 28.6 % — ABNORMAL LOW (ref 39.0–52.0)
Hemoglobin: 9.9 g/dL — ABNORMAL LOW (ref 13.0–17.0)
MCH: 28.1 pg (ref 26.0–34.0)
MCHC: 34.6 g/dL (ref 30.0–36.0)
MCV: 81.3 fL (ref 78.0–100.0)
PLATELETS: 207 10*3/uL (ref 150–400)
RBC: 3.52 MIL/uL — ABNORMAL LOW (ref 4.22–5.81)
RDW: 12.8 % (ref 11.5–15.5)
WBC: 7.7 10*3/uL (ref 4.0–10.5)

## 2017-10-18 LAB — SODIUM: SODIUM: 117 mmol/L — AB (ref 135–145)

## 2017-10-18 MED ORDER — TOLVAPTAN 15 MG PO TABS
15.0000 mg | ORAL_TABLET | ORAL | Status: DC
  Administered 2017-10-18: 15 mg via ORAL
  Filled 2017-10-18 (×2): qty 1

## 2017-10-18 MED ORDER — PROMETHAZINE HCL 25 MG PO TABS: 25.0000 mg | ORAL_TABLET | Freq: Four times a day (QID) | ORAL | Status: DC | PRN

## 2017-10-18 NOTE — Consult Note (Signed)
Consultation  Referring Provider: Dr. Tana Coast     Primary Care Physician:  Cyndy Freeze, MD Primary Gastroenterologist:   Dr. Lyndel Safe most recently, though Dr. Henrene Pastor in the past     Reason for Consultation:  Nausea and vomiting            HPI:   Gregory Bates is a 76 y.o. male with a past medical history as listed below including CAD status post DES to the distal RCA in 2011, and STEMI in 2014 and chronic combined systolic and diastolic CHF his last cath 05/18/17 with an EF of 40-45 %, as well as multiple as listed below, who presented to the ER on 10/15/17 with complaints of nausea and vomiting for 3 days.  Apparently he had been to see his PCP and was given Zofran and took 2 doses which "helped a little".  Patient was admitted for management of his hyponatremia.  We are consulted today in regards to patient's ongoing nausea.    Today, the patient was found with his wife by his side.  He is up and standing at the sink bathing himself.  The patient describes that last week he was severely nauseous and had a few episodes of vomiting and was seen by Dr. Lyndel Safe, he arranged for EGD for further evaluation which was supposed to take place today.  Patient tells me now though that the "nausea is not as bad".  He also tells me he has not vomited since being in the hospital.  Patient tells me that last week it was less of a "vomit" and more of a "coughing and gagging and carrying on" per his wife and himself.     Currently, the patient tells me he is not that bothered by nausea.  Patient tells me that the food here is "not that good" and sometimes he does get nauseous when eating but this is only certain meals.  He did well eating a regular diet yesterday, with no symptoms. This morning he did have a small amount of nausea as he felt the "food did not taste good", so he stopped eating.  Patient tells me he is not that concerned about his nausea at this time as he has "a lot of other things going on".    Patient  denies fever, chills, blood in his stool, melena, change in bowel habits, weight loss, anorexia, heartburn, reflux or abdominal pain.  ED course: Sodium less than 120  Previous GI history: 12/20/02-colonoscopy, Dr. Henrene Pastor: Impression: Diverticulosis and colon polyps  Past Medical History:  Diagnosis Date  . Acute renal insufficiency    a. During 11/2013 adm with atrial flutter.  . Anemia, unspecified   . CAD (coronary artery disease), native coronary artery    a. s/p cath May 2011 >> DES distal RCA;  b. 01/2013 NSTEMI >> OM1 99p (2.75x12 Promus Premier DES);  c. LHC (2/15): PCI: Promus DES to dist RCA ;  d. LHC 1/16 - dLM 80, oLAD 30, small D1 tandem 88, pOM1 stent ok, mOM 50-60, dOM bifurcation 50-60 in both vessels, AV 45-70 jailed by stent, pRCA 65 mRCA stent ok, dRCA stent ok, PLB smal 50, dPDA 60, 80; EF 35-40 >> med Rx - PCI of PDA if angina  . Chronic combined systolic and diastolic CHF (congestive heart failure) ([place]) 12/23/2015   Echo 2/17: Mild LVH, EF 45-50%, inferior akinesis, grade 2 diastolic dysfunction, mild AI, mild MR, mild LAE, PASP 44 mmHg   . Colorectal polyps  2002  . Diabetes mellitus    AODM  . Erectile dysfunction   . History of echocardiogram    a. 01/2013 Echo: EF 55%, mid-dist inflat HK, Gr1 DD, Triv AI/TR, Mild MR.;  b.  Echo (1/15): Mild LVH, EF 55%, inferolateral hypokinesis, grade 1 diastolic dysfunction, mild AI, trivial MR, moderate LAE, PASP 36 mmHg   . Hx of cardiovascular stress test    Stress myoview 08/07/13 with LVEF 44%, inferior scar, no ischemia  . Hyperlipidemia   . Hypertension   . Ischemic cardiomyopathy    a. EF 35-40% by LHC in 1/16; b. Echo 2/17: Mild LVH, EF 45-50%, inferior akinesis, grade 2 diastolic dysfunction, mild AI, mild MR, mild LAE, PASP 44 mmHg  . Myocardial infarction ([place])   . PAF (paroxysmal atrial fibrillation) ([place])    a. Dx 11/2013. Spont conv. Placed on eliquis.    Past Surgical History:  Procedure Laterality Date  .  COLONOSCOPY W/ POLYPECTOMY  2002    GI  . COLONOSCOPY W/ POLYPECTOMY  2004; 2007 negative  . CORONARY ANGIOPLASTY  12/26/2013  . CORONARY STENT PLACEMENT  2007, 2011  . infantile paralysis facial asymmetry    . LEFT HEART CATH AND CORONARY ANGIOGRAPHY N/A 05/18/2017   Procedure: Left Heart Cath and Coronary Angiography;  Surgeon: Martinique, Peter M, MD;  Location: [place] [facility] LAB;  Service: Cardiovascular;  Laterality: N/A;  . LEFT HEART CATHETERIZATION WITH CORONARY ANGIOGRAM N/A 10/02/2012   Procedure: LEFT HEART CATHETERIZATION WITH CORONARY ANGIOGRAM;  Surgeon: Peter M Martinique, MD;  Location: [facility] CATH LAB;  Service: Cardiovascular;  Laterality: N/A;  . LEFT HEART CATHETERIZATION WITH CORONARY ANGIOGRAM N/A 02/20/2013   Procedure: LEFT HEART CATHETERIZATION WITH CORONARY ANGIOGRAM;  Surgeon: Burnell Blanks, MD;  Location: [facility] CATH LAB;  Service: Cardiovascular;  Laterality: N/A;  . LEFT HEART CATHETERIZATION WITH CORONARY ANGIOGRAM N/A 12/26/2013   Procedure: LEFT HEART CATHETERIZATION WITH CORONARY ANGIOGRAM;  Surgeon: Burnell Blanks, MD;  Location: [facility] CATH LAB;  Service: Cardiovascular;  Laterality: N/A;  . LEFT HEART CATHETERIZATION WITH CORONARY ANGIOGRAM N/A 11/28/2014   Procedure: LEFT HEART CATHETERIZATION WITH CORONARY ANGIOGRAM;  Surgeon: Burnell Blanks, MD;  Location: [facility] CATH LAB;  Service: Cardiovascular;  Laterality: N/A;  . PERCUTANEOUS CORONARY STENT INTERVENTION (PCI-S)  10/02/2012   Procedure: PERCUTANEOUS CORONARY STENT INTERVENTION (PCI-S);  Surgeon: Peter M Martinique, MD;  Location: [facility] CATH LAB;  Service: Cardiovascular;;  . PERCUTANEOUS CORONARY STENT INTERVENTION (PCI-S)  02/20/2013   Procedure: PERCUTANEOUS CORONARY STENT INTERVENTION (PCI-S);  Surgeon: Burnell Blanks, MD;  Location: [facility] CATH LAB;  Service: Cardiovascular;;  . PERCUTANEOUS CORONARY STENT INTERVENTION (PCI-S)  12/26/2013   Procedure: PERCUTANEOUS CORONARY STENT INTERVENTION (PCI-S);   Surgeon: Burnell Blanks, MD;  Location: [facility] CATH LAB;  Service: Cardiovascular;;  . WISDOM TOOTH EXTRACTION      Family History  Problem Relation Age of Onset  . Heart attack Brother 56  . Colon cancer Brother   . Diabetes Neg Hx   . Stroke Neg Hx   . Hypertension Neg Hx      Social History   Tobacco Use  . Smoking status: Former Smoker    Last attempt to quit: 11/02/1984    Years since quitting: 32.9  . Smokeless tobacco: Never Used  . Tobacco comment: smoked [place], up to 3 ppd (!)  Substance Use Topics  . Alcohol use: No  . Drug use: No    Prior to Admission medications   Medication Sig Start  Date End Date Taking? Authorizing Provider  amiodarone (PACERONE) 200 MG tablet Take 1 tablet (200 mg total) by mouth daily. 02/24/17  Yes Burnell Blanks, MD  amLODipine (NORVASC) 5 MG tablet TAKE 1 TABLET EVERY DAY 08/20/17  Yes Weaver, Scott T, PA-C  atorvastatin (LIPITOR) 20 MG tablet Take 20 mg by mouth at bedtime.   Yes [provider]  Chelated Potassium 99 MG TABS Take 99 mg by mouth daily as needed (supplementation).   Yes [provider]  cholecalciferol (VITAMIN D) 1000 UNITS tablet Take 1,000 Units by mouth every evening.    Yes [provider]  clopidogrel (PLAVIX) 75 MG tablet Take 1 tablet (75 mg total) by mouth daily. 06/21/17  Yes Weaver, Scott T, PA-C  ELIQUIS 5 MG TABS tablet TAKE ONE TABLET BY MOUTH TWICE DAILY 05/03/17  Yes Burnell Blanks, MD  fexofenadine (ALLEGRA) 180 MG tablet Take 180 mg by mouth daily as needed for allergies.    Yes [provider]  furosemide (LASIX) 20 MG tablet Take 20 mg by mouth daily.   Yes [provider]  isosorbide mononitrate (IMDUR) 60 MG 24 hr tablet Take 1 tablet (60 mg total) by mouth daily. 04/20/17  Yes Bhagat, Bhavinkumar, PA  lisinopril (PRINIVIL,ZESTRIL) 20 MG tablet TAKE 1 TABLET EVERY DAY 06/01/17  Yes Burnell Blanks, MD  metoprolol tartrate  (LOPRESSOR) 25 MG tablet Take 1 tablet (25 mg total) by mouth 2 (two) times daily. 04/20/17  Yes Bhagat, Bhavinkumar, PA  Multiple Vitamin (MULTIVITAMIN) tablet Take 1 tablet by mouth at bedtime.    Yes [provider]  nitroGLYCERIN (NITROSTAT) 0.4 MG SL tablet Place 0.4 mg under the tongue every 5 (five) minutes as needed for chest pain.   Yes [provider]  ondansetron (ZOFRAN) 8 MG tablet Take 8 mg by mouth every 8 (eight) hours as needed for nausea or vomiting.   Yes [provider]  vitamin E 100 UNIT capsule Take 100 Units by mouth at bedtime.    Yes [provider]    Current Facility-Administered Medications  Medication Dose Route Frequency Provider Last Rate Last Dose  . acetaminophen (TYLENOL) tablet 650 mg  650 mg Oral Q6H PRN Jani Gravel, MD       Or  . acetaminophen (TYLENOL) suppository 650 mg  650 mg Rectal Q6H PRN Jani Gravel, MD      . amiodarone (PACERONE) tablet 200 mg  200 mg Oral Daily Jani Gravel, MD   200 mg at 10/18/17 1950  . amLODipine (NORVASC) tablet 5 mg  5 mg Oral Daily Jani Gravel, MD   5 mg at 10/18/17 9326  . apixaban (ELIQUIS) tablet 5 mg  5 mg Oral BID Jani Gravel, MD   5 mg at 10/18/17 7124  . aspirin chewable tablet 81 mg  81 mg Oral Pre-Cath Burnell Blanks, MD      . atorvastatin (LIPITOR) tablet 20 mg  20 mg Oral Loma Sousa, MD   20 mg at 10/17/17 2149  . benzonatate (TESSALON) capsule 100 mg  100 mg Oral TID PRN Jani Gravel, MD   100 mg at 10/16/17 0241  . clopidogrel (PLAVIX) tablet 75 mg  75 mg Oral Daily Jani Gravel, MD   75 mg at 10/18/17 5809  . HYDROcodone-homatropine (HYCODAN) 5-1.5 MG/5ML syrup 5 mL  5 mL Oral Q6H PRN Jani Gravel, MD   5 mL at 10/17/17 2148  . insulin aspart (novoLOG) injection 0-5 Units  0-5 Units  Subcutaneous QHS Jani Gravel, MD      . insulin aspart (novoLOG) injection 0-9 Units  0-9 Units Subcutaneous TID WC Jani Gravel, MD      . isosorbide mononitrate (IMDUR) 24 hr tablet 60 mg  60 mg  Oral Daily Jani Gravel, MD   60 mg at 10/18/17 8315  . loratadine (CLARITIN) tablet 10 mg  10 mg Oral Daily Jani Gravel, MD   10 mg at 10/18/17 1761  . metoprolol tartrate (LOPRESSOR) tablet 25 mg  25 mg Oral BID Jani Gravel, MD   25 mg at 10/18/17 0956  . multivitamin with minerals tablet 1 tablet  1 tablet Oral Loma Sousa, MD   1 tablet at 10/17/17 2149  . nitroGLYCERIN (NITROSTAT) SL tablet 0.4 mg  0.4 mg Sublingual Q5 min PRN Jani Gravel, MD      . ondansetron [facility]) injection 4 mg  4 mg Intravenous Q6H PRN Jani Gravel, MD   4 mg at 10/17/17 0825  . pantoprazole (PROTONIX) EC tablet 40 mg  40 mg Oral BID Jani Gravel, MD   40 mg at 10/18/17 6073  . pneumococcal 23 valent vaccine (PNU-IMMUNE) injection 0.5 mL  0.5 mL Intramuscular Tomorrow-1000 Rai, Ripudeep K, MD      . promethazine (PHENERGAN) tablet 25 mg  25 mg Oral Q6H PRN Elmarie Shiley, MD      . sodium chloride flush (NS) 0.9 % injection 3 mL  3 mL Intravenous Q12H Burnell Blanks, MD   3 mL at 10/18/17 0957  . sodium chloride flush (NS) 0.9 % injection 3 mL  3 mL Intravenous PRN Burnell Blanks, MD   3 mL at 10/17/17 2152  . tolvaptan (SAMSCA) tablet 15 mg  15 mg Oral Q24H Elmarie Shiley, MD   15 mg at 10/18/17 7106    Allergies as of 10/15/2017 - Review Complete 10/15/2017  Allergen Reaction Noted  . Brilinta [ticagrelor] Other (See Comments) 01/09/2013  . Crestor [rosuvastatin] Other (See Comments) 08/30/2013     Review of Systems:    Constitutional: No weight loss, fever or chills Skin: No rash  Cardiovascular: No chest pain Respiratory: No SOB  Gastrointestinal: See HPI and otherwise negative Genitourinary: No dysuria  Neurological: No headache, dizziness or syncope Musculoskeletal: No new muscle or joint pain Hematologic: No bleeding  Psychiatric: No history of depression or anxiety   Physical Exam:  Vital signs in last 24 hours: Temp:  [97.4 F (36.3 C)-98 F (36.7 C)] 97.4 F (36.3 C) (12/17  0735) Pulse Rate:  [51-61] 60 (12/17 0349) Resp:  [14-20] 14 (12/17 0735) BP: (101-139)/(52-74) 133/72 (12/17 0735) SpO2:  [95 %-100 %] 95 % (12/17 0349) Weight:  [188 lb (85.3 kg)] 188 lb (85.3 kg) (12/17 0500) Last BM Date: 10/17/17 General:   Pleasant elderly Caucasian male appears to be in NAD, Well developed, Well nourished, alert and cooperative Head:  Normocephalic and atraumatic. Eyes:   PEERL, EOMI. No icterus. Conjunctiva pink. Ears:  Normal auditory acuity. Neck:  Supple Throat: Oral cavity and pharynx without inflammation, swelling or lesion.  Lungs: Respirations even and unlabored. Lungs clear to auscultation bilaterally.   No wheezes, crackles, or rhonchi.  Heart: Normal S1, S2. No MRG. Regular rate and rhythm. No peripheral edema, cyanosis or pallor.  Abdomen:  Soft, nondistended, nontender. No rebound or guarding. Normal bowel sounds. No appreciable masses or hepatomegaly. Rectal:  Not performed.  Msk:  Symmetrical without gross deformities.  Extremities:  Without edema, no deformity or joint abnormality.  Neurologic:  Alert and  oriented x4;  grossly normal neurologically.  Skin:   Dry and intact without significant lesions or rashes. Psychiatric:  Demonstrates good judgement and reason without abnormal affect or behaviors.   LAB RESULTS: Recent Labs    10/16/17 0408 10/17/17 0853 10/18/17 0539  WBC 8.2 9.1 7.7  HGB 10.0* 10.6* 9.9*  HCT 28.9* 30.3* 28.6*  PLT 218 214 207   BMET Recent Labs    10/17/17 1857 10/18/17 0033 10/18/17 0539 10/18/17 0922  NA 113* 114* 116* 117*  K 4.2 4.0 4.9  --   CL 84* 86* 86*  --   CO2 21* 22 24  --   GLUCOSE 138* 88 95  --   BUN 7 7 7   --   CREATININE 1.21 1.07 1.16  --   CALCIUM 8.3* 8.3* 8.7*  --    LFT Recent Labs    10/16/17 0408  PROT 5.7*  ALBUMIN 3.4*  AST 36  ALT 44  ALKPHOS 63  BILITOT 0.5   STUDIES: Ct Head Wo Contrast  Result Date: 10/16/2017 CLINICAL DATA:  Fall, hit head EXAM: CT HEAD  WITHOUT CONTRAST TECHNIQUE: Contiguous axial images were obtained from the base of the skull through the vertex without intravenous contrast. COMPARISON:  10/31/2003 FINDINGS: Brain: Calcified mass in the left temporal region measures 2.3 x 1.8 cm most compatible with calcified meningioma. There is atrophy and chronic small vessel disease changes. No hemorrhage, hydrocephalus or acute infarction. Vascular: No hyperdense vessel or unexpected calcification. Skull: No acute calvarial abnormality. Sinuses/Orbits: No acute finding Other: None IMPRESSION: Left temporal calcified mass compatible with meningioma. No acute intracranial abnormality. Atrophy, chronic microvascular disease. Electronically Signed   By: Rolm Baptise M.D.   On: 10/16/2017 15:58   EXAM: CT ABDOMEN AND PELVIS WITHOUT CONTRAST 10/16/17  TECHNIQUE: Multidetector CT imaging of the abdomen and pelvis was performed following the standard protocol without IV contrast.  COMPARISON:  Abdominal radiograph performed 10/15/2017  FINDINGS: Lower chest: The visualized lung bases are clear. Diffuse coronary artery calcifications are seen.  Hepatobiliary: The liver is unremarkable in appearance. The gallbladder is unremarkable in appearance. The common bile duct remains normal in caliber.  Pancreas: The pancreas is within normal limits.  Spleen: The spleen is unremarkable in appearance.  Adrenals/Urinary Tract: The adrenal glands are unremarkable in appearance.  Mild nonspecific perinephric stranding is noted bilaterally. A right renal cyst is noted. There is no evidence of hydronephrosis. No renal or ureteral stones are identified.  Stomach/Bowel: The stomach is unremarkable in appearance. The small bowel is within normal limits. The appendix is normal in caliber, without evidence of appendicitis.  Mild diverticulosis is noted along the sigmoid colon, without evidence of diverticulitis.  Vascular/Lymphatic: There is  aneurysmal dilatation of the infrarenal abdominal aorta to 3.9 cm in AP dimension, resolving proximal to the aortic bifurcation. Diffuse calcification is seen along the abdominal aorta and its branches.  No retroperitoneal or pelvic sidewall lymphadenopathy is seen.  Reproductive: The bladder is significantly distended and grossly unremarkable. The prostate is normal in size.  Other: A 2.2 cm nonspecific subcutaneous soft tissue nodule is noted at the left inguinal region.  Musculoskeletal: No acute osseous abnormalities are identified. The visualized musculature is unremarkable in appearance.  IMPRESSION: 1. No acute abnormality seen within the abdomen or pelvis. 2. **An incidental finding of potential clinical significance has been found. Aneurysmal dilatation of the infrarenal abdominal aorta to 3.9 cm in AP dimension. Recommend followup by ultrasound in  2 years. This recommendation follows ACR consensus guidelines: White Paper of the ACR Incidental Findings Committee II on Vascular Findings. J Am Coll Radiol 2013; 10:789-794.** 3. Diffuse coronary artery calcifications seen. 4. Right renal cyst noted. 5. Mild diverticulosis along the sigmoid colon, without evidence of diverticulitis. 6. 2.2 cm nonspecific subcutaneous soft tissue nodule at the left inguinal region. Aortic Atherosclerosis (ICD10-I70.0).   Electronically Signed   By: Garald Balding M.D.   On: 10/16/2017 05:59  PREVIOUS ENDOSCOPIES:            See HPI   Impression / Plan:   Impression: 1.  Nausea and Vomiting: Patient describes no further vomiting since last week and only intermittent nausea with certain meals, no abnormalities on Ct abd/pelvis or Ct head; consider mild gastritis versus other 2.  Hyponatremia: Nephrology following 3. Chronic Anticoagulation: on Eliquis for A Fib  Plan: 1.  Discussed possible EGD with the patient today.  He does not feel this is necessary at this time as he has  had decreased symptoms of nausea and no further vomiting.  He is more concerned about his other medical problems at this time. 2.  Continue as needed antiemetics 3.  Continue supportive measures 4.  Please await final recommendations from Dr. Carlean Purl later today.  Thank you for your kind consultation, we will continue to follow.  Lavone Nian Lemmon  10/18/2017, 11:00 AM Pager #: [phone]     [facility] Attending   I have taken an interval history, reviewed the chart and examined the patient. I agree with the Advanced Practitioner's note, impression and recommendations.     We think that the hyponatremia is causing the nausea and occasional vomiting. Can hold off on EGD - do not think necessary in this situation.  Not mentioned by others but he has an apparent left temporal meningioma - ? If that could be related to his hyponatremia. Probably not but just want brought to attention.  I will be available but we will sign off presently.  Thanks  Gatha Mayer, MD, [facility] [phone] (pager) 10/18/2017 2:55 PM

## 2017-10-18 NOTE — Progress Notes (Signed)
Patient ID: Gregory Bates, male   DOB: 1941-10-23, 76 y.o.   MRN: [id number]  [facility] Progress Note   Assessment/ Plan:   1. Hyponatremia: Euvolemic on physical exam and labs suggestive of inappropriate ADH secretion. Unresponsive to isotonic saline and has not responded to fluid restriction/salt tablets per night. Based on labs as far back as July, 2018-this appears to be acute in nature. Discontinue sodium chloride tablets and begin Tolvaptan to allow for more consistent improvement of sodium levels with close lab monitoring. Discontinue fluid restriction while on Tolvaptan. Will switch him over to furosemide when sodium levels are higher. 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 2. Nausea/vomiting: Was being evaluated by gastroenterology as an outpatient with plans for EGD. Will continue aggressive management of nausea/vomiting to suppress ADH secretion. 3. History of congestive heart failure: Appears to be compensated, resume diuretics when sodium levels improved in anticipation of discontinuing Tolvaptan (anticipate this latter medication will be used only as an inpatient). 4. History of coronary artery disease  Subjective:   Reports inability to eat breakfast this morning because of nausea. Denies chest pain or shortness of breath.    Objective:   BP 133/72 (BP Location: Right Arm)   Pulse 60   Temp (!) 97.4 F (36.3 C) (Oral)   Resp 14   Ht 5\' 11"  (1.803 m)   Wt 85.3 kg (188 lb)   SpO2 95%   BMI 26.22 kg/m   Intake/Output Summary (Last 24 hours) at 10/18/2017 1324 Last data filed at 10/17/2017 2022 Gross per 24 hour  Intake 900 ml  Output 550 ml  Net 350 ml   Weight change: -0.424 kg (-15 oz)  Physical Exam: Gen: Comfortably sitting up in a recliner, wife at bedside CVS: Pulse regular rhythm, normal rate, S1 and S2 normal Resp: Clear  to auscultation bilaterally, no rales/rhonchi Abd: Soft, obese, nontender Ext: No lower extremity edema  Imaging: Ct Head Wo Contrast  Result Date: 10/16/2017 CLINICAL DATA:  Fall, hit head EXAM: CT HEAD WITHOUT CONTRAST TECHNIQUE: Contiguous axial images were obtained from the base of the skull through the vertex without intravenous contrast. COMPARISON:  10/31/2003 FINDINGS: Brain: Calcified mass in the left temporal region measures 2.3 x 1.8 cm most compatible with calcified meningioma. There is atrophy and chronic small vessel disease changes. No hemorrhage, hydrocephalus or acute infarction. Vascular: No hyperdense vessel or unexpected calcification. Skull: No acute calvarial abnormality. Sinuses/Orbits: No acute finding Other: None IMPRESSION: Left temporal calcified mass compatible with meningioma. No acute intracranial abnormality. Atrophy, chronic microvascular disease. Electronically Signed   By: Rolm Baptise M.D.   On: 10/16/2017 15:58    Labs: BMET Recent Labs  Lab 10/16/17 1832 10/17/17 0212 10/17/17 0853 10/17/17 1219 10/17/17 1857 10/18/17 0033 10/18/17 0539  NA 112* 115* 114* 114* 113* 114* 116*  K 4.3 4.5 4.5 4.5 4.2 4.0 4.9  CL 84* 87* 86* 87* 84* 86* 86*  CO2 21* 20* 19* 21* 21* 22 24  GLUCOSE 151* 89 169* 118* 138* 88 95  BUN 9 8 7 6 7 7 7   CREATININE 1.19 1.11 1.20 1.27* 1.21 1.07 1.16  CALCIUM 8.0* 8.2* 8.6* 8.2* 8.3* 8.3* 8.7*   CBC Recent Labs  Lab 10/15/17 1915 10/16/17 0408 10/17/17 0853 10/18/17 0539  WBC 9.5 8.2 9.1 7.7  HGB 11.9* 10.0* 10.6* 9.9*  HCT 34.0* 28.9* 30.3* 28.6*  MCV  81.1 81.2 81.9 81.3  PLT 262 218 214 207    Medications:    . amiodarone  200 mg Oral Daily  . amLODipine  5 mg Oral Daily  . apixaban  5 mg Oral BID  . aspirin  81 mg Oral Pre-Cath  . atorvastatin  20 mg Oral QHS  . clopidogrel  75 mg Oral Daily  . insulin aspart  0-5 Units Subcutaneous QHS  . insulin aspart  0-9 Units Subcutaneous TID WC  . isosorbide  mononitrate  60 mg Oral Daily  . loratadine  10 mg Oral Daily  . metoprolol tartrate  25 mg Oral BID  . multivitamin with minerals  1 tablet Oral QHS  . pantoprazole  40 mg Oral BID  . pneumococcal 23 valent vaccine  0.5 mL Intramuscular Tomorrow-1000  . sodium chloride flush  3 mL Intravenous Q12H  . tolvaptan  15 mg Oral Q24H   Elmarie Shiley, MD 10/18/2017, 8:24 AM

## 2017-10-18 NOTE — Progress Notes (Signed)
Patient's HR was 59-60 in the morning, there was morning dose of metoprolol 25 mg and notified Dr. Tana Coast this matter, hold metoprolol when HR less than 50. Administered metoprolol and pt's HR is staying on lower 50's now. Continue to monitor HR. HS [employer]

## 2017-10-18 NOTE — Progress Notes (Signed)
[facility]                                                                              Patient Demographics  Gregory Bates, is a 76 y.o. male, DOB - 21-Nov-1940, MWN:[id number]  Admit date - 10/15/2017   Admitting Physician Jani Gravel, MD  Outpatient Primary MD for the patient is Cyndy Freeze, MD  Outpatient specialists:   LOS - 3  days   Medical records reviewed and are as summarized below:    Chief Complaint  Patient presents with  . Emesis       Brief summary   Per admit note by Dr. Maudie Mercury on 12/14 Gregory Bates  is a 76 y.o. male, w Pafib , CAD s/p stent, CHF (40-45%) , Hypertension, Hyperlipidemia, Dm2, apparently had n/v after eating cheeze sandwich.  Pt having n/v since Wednesday.   Slight dry cough since Wednesday.  Pt denied fever, chill, cp, palp, sob beyond baseline ,abd pain, diarrhea, brbpr.  Pt was brought to ED for evaluation of low sodium which was found by his pcp. Pt was taken off lasix on Wednesday.   Assessment & Plan    Principal Problem: Acute hyponatremia -Unclear etiology, also having nausea and vomiting for last 3 days, presented with sodium of 113.  Not symptomatic, patient alert and oriented. - Serum osmolarity low 244, urine osmolarity 389, urine sodium 94 on admission - No significant improvement with IV fluids.  Nephrology was consulted, placed on salt tablets and fluid restriction however with no significant improvement again.  Nephrology recommended tolvaptan today, follow sodium level closely, will continue BMET every 6 hours.   Active Problems:   Nausea & vomiting -Unclear etiology, CT abdomen pelvis showed no acute abnormality.  Aneurysmal dilatation of infrarenal abdominal aorta 3.9 cm seen incidentally, possibly could have gastroparesis due to history of diabetes. -Patient however having persistent nausea for last 3 months, had episode of vomiting this morning.  He was scheduled to have outpatient endoscopy today at [facility]  with his gastroenterologist, Dr. Lyndel Safe.  -GI consult called, discussed with Azucena Freed.   Infrarenal abdominal aorta aneurysm -Incidentally CT abdomen showed aneurysmal dilatation of infrarenal abdominal aorta to 3.9 cm, recommended follow-up ultrasound in 2 years.  Chronic combined systolic and diastolic CHF -2D echo 4/03, showed EF of 45%-50%, akinesis and scarring of the basal mid inferior myocardium, grade 2 diastolic dysfunction - Currently holding Lasix since 12/12 and lisinopril placed on hold on 12/15. - Continue metoprolol, Plavix, Imdur, amlodipine, Lipitor - There was a plan of cardiac cath outpatient, discussed with Dr. Domenic Polite (on-call cardiology), cath has been rescheduled to November 12, 2017, relayed to patient and his wife. - will need to resume diuretics once sodium level has improved after tolvaptan   Paroxysmal atrial fibrillation -Currently rate controlled, continue metoprolol, amiodarone -Mali vasc 4, Continue Eliquis for anticoagulation, will need to hold for EGD if planned by GI    Diabetes mellitus -CBG stable, continue sliding scale insulin   Mechanical fall -Patient had mechanical fall on 12/15, notified by RN, had hit his head on the table, CT head negative.  No neurological deficits.  Code Status: Full code DVT Prophylaxis: Apixaban Family Communication: Discussed in detail with the patient, all imaging results, lab results explained to the patient.  Disposition Plan  Time Spent in minutes   25 minutes  Procedures:  None  Consultants:   Nephrology GI  Antimicrobials:      Medications  Scheduled Meds: . amiodarone  200 mg Oral Daily  . amLODipine  5 mg Oral Daily  . apixaban  5 mg Oral BID  . aspirin  81 mg Oral Pre-Cath  . atorvastatin  20 mg Oral QHS  . clopidogrel  75 mg Oral Daily  . insulin aspart  0-5 Units Subcutaneous QHS  . insulin aspart  0-9 Units Subcutaneous TID WC  . isosorbide mononitrate  60 mg Oral Daily  .  loratadine  10 mg Oral Daily  . metoprolol tartrate  25 mg Oral BID  . multivitamin with minerals  1 tablet Oral QHS  . pantoprazole  40 mg Oral BID  . pneumococcal 23 valent vaccine  0.5 mL Intramuscular Tomorrow-1000  . sodium chloride flush  3 mL Intravenous Q12H  . tolvaptan  15 mg Oral Q24H   Continuous Infusions:  PRN Meds:.acetaminophen **OR** acetaminophen, benzonatate, HYDROcodone-homatropine, nitroGLYCERIN, ondansetron (ZOFRAN) IV, promethazine, sodium chloride flush   Antibiotics   Anti-infectives (From admission, onward)   None        Subjective:   Gregory Bates was seen and examined today.  One episode of vomiting today, will schedule to have outpatient EGD today.  Denies any abdominal pain.  Patient denies dizziness, chest pain, shortness of breath, abdominal pain, diarrhea, new weakness, numbess, tingling.  No fevers.  Objective:   Vitals:   10/18/17 0003 10/18/17 0349 10/18/17 0500 10/18/17 0735  BP: (!) 111/55 139/74  133/72  Pulse: 61 60    Resp: 20 19  14   Temp: 97.8 F (36.6 C) 98 F (36.7 C)  (!) 97.4 F (36.3 C)  TempSrc: Oral Oral  Oral  SpO2: 96% 95%    Weight:   85.3 kg (188 lb)   Height:        Intake/Output Summary (Last 24 hours) at 10/18/2017 1055 Last data filed at 10/18/2017 1035 Gross per 24 hour  Intake 900 ml  Output 875 ml  Net 25 ml     Wt Readings from Last 3 Encounters:  10/18/17 85.3 kg (188 lb)  10/08/17 88.4 kg (194 lb 12.8 oz)  06/21/17 85.6 kg (188 lb 12.8 oz)     Exam   General: Alert and oriented x 3, NAD  Eyes:   HEENT:  Atraumatic, normocephalic  Cardiovascular: S1 S2 clear, RRR No pedal edema b/l  Respiratory: Clear to auscultation bilaterally, no wheezing, rales or rhonchi  Gastrointestinal: Soft, nontender, nondistended, + bowel sounds  Ext: no pedal edema bilaterally  Neuro: no new deficits  Musculoskeletal: No digital cyanosis, clubbing  Skin: No rashes  Psych: Normal affect and  demeanor, alert and oriented x3    Data Reviewed:  I have personally reviewed following labs and imaging studies  Micro Results Recent Results (from the past 240 hour(s))  MRSA PCR Screening     Status: None   Collection Time: 10/16/17  2:40 PM  Result Value Ref Range Status   MRSA by PCR NEGATIVE NEGATIVE Final    Comment:        The GeneXpert MRSA Assay (FDA approved for NASAL specimens only), is one component of a comprehensive MRSA colonization surveillance program. It is not intended to  diagnose MRSA infection nor to guide or monitor treatment for MRSA infections.     Radiology Reports Ct Abdomen Pelvis Wo Contrast  Result Date: 10/16/2017 CLINICAL DATA:  Acute onset of nausea and vomiting. EXAM: CT ABDOMEN AND PELVIS WITHOUT CONTRAST TECHNIQUE: Multidetector CT imaging of the abdomen and pelvis was performed following the standard protocol without IV contrast. COMPARISON:  Abdominal radiograph performed 10/15/2017 FINDINGS: Lower chest: The visualized lung bases are clear. Diffuse coronary artery calcifications are seen. Hepatobiliary: The liver is unremarkable in appearance. The gallbladder is unremarkable in appearance. The common bile duct remains normal in caliber. Pancreas: The pancreas is within normal limits. Spleen: The spleen is unremarkable in appearance. Adrenals/Urinary Tract: The adrenal glands are unremarkable in appearance. Mild nonspecific perinephric stranding is noted bilaterally. A right renal cyst is noted. There is no evidence of hydronephrosis. No renal or ureteral stones are identified. Stomach/Bowel: The stomach is unremarkable in appearance. The small bowel is within normal limits. The appendix is normal in caliber, without evidence of appendicitis. Mild diverticulosis is noted along the sigmoid colon, without evidence of diverticulitis. Vascular/Lymphatic: There is aneurysmal dilatation of the infrarenal abdominal aorta to 3.9 cm in AP dimension, resolving  proximal to the aortic bifurcation. Diffuse calcification is seen along the abdominal aorta and its branches. No retroperitoneal or pelvic sidewall lymphadenopathy is seen. Reproductive: The bladder is significantly distended and grossly unremarkable. The prostate is normal in size. Other: A 2.2 cm nonspecific subcutaneous soft tissue nodule is noted at the left inguinal region. Musculoskeletal: No acute osseous abnormalities are identified. The visualized musculature is unremarkable in appearance. IMPRESSION: 1. No acute abnormality seen within the abdomen or pelvis. 2. **An incidental finding of potential clinical significance has been found. Aneurysmal dilatation of the infrarenal abdominal aorta to 3.9 cm in AP dimension. Recommend followup by ultrasound in 2 years. This recommendation follows ACR consensus guidelines: White Paper of the ACR Incidental Findings Committee II on Vascular Findings. J Am Coll Radiol 2013; 10:789-794.** 3. Diffuse coronary artery calcifications seen. 4. Right renal cyst noted. 5. Mild diverticulosis along the sigmoid colon, without evidence of diverticulitis. 6. 2.2 cm nonspecific subcutaneous soft tissue nodule at the left inguinal region. Aortic Atherosclerosis (ICD10-I70.0). Electronically Signed   By: Garald Balding M.D.   On: 10/16/2017 05:59   Dg Chest 2 View  Result Date: 10/16/2017 CLINICAL DATA:  Acute onset of nausea and vomiting. EXAM: CHEST  2 VIEW COMPARISON:  Chest radiograph performed 04/17/2017 FINDINGS: The lungs are well-aerated and clear. There is no evidence of focal opacification, pleural effusion or pneumothorax. The heart is normal in size; the mediastinal contour is within normal limits. No acute osseous abnormalities are seen. IMPRESSION: No acute cardiopulmonary process seen. Electronically Signed   By: Garald Balding M.D.   On: 10/16/2017 04:07   Ct Head Wo Contrast  Result Date: 10/16/2017 CLINICAL DATA:  Fall, hit head EXAM: CT HEAD WITHOUT  CONTRAST TECHNIQUE: Contiguous axial images were obtained from the base of the skull through the vertex without intravenous contrast. COMPARISON:  10/31/2003 FINDINGS: Brain: Calcified mass in the left temporal region measures 2.3 x 1.8 cm most compatible with calcified meningioma. There is atrophy and chronic small vessel disease changes. No hemorrhage, hydrocephalus or acute infarction. Vascular: No hyperdense vessel or unexpected calcification. Skull: No acute calvarial abnormality. Sinuses/Orbits: No acute finding Other: None IMPRESSION: Left temporal calcified mass compatible with meningioma. No acute intracranial abnormality. Atrophy, chronic microvascular disease. Electronically Signed   By: Rolm Baptise M.D.  On: 10/16/2017 15:58   Dg Abd 2 Views  Result Date: 10/16/2017 CLINICAL DATA:  Acute onset of nausea and vomiting. EXAM: ABDOMEN - 2 VIEW COMPARISON:  None. FINDINGS: The visualized bowel gas pattern is unremarkable. Scattered air and stool filled loops of colon are seen; no abnormal dilatation of small bowel loops is seen to suggest small bowel obstruction. No free intra-abdominal air is identified, though evaluation for free air is limited on a single supine view. The visualized osseous structures are within normal limits; the sacroiliac joints are unremarkable in appearance. The visualized lung bases are essentially clear. IMPRESSION: Unremarkable bowel gas pattern; no free intra-abdominal air seen. Moderate amount of stool noted in the colon. Electronically Signed   By: Garald Balding M.D.   On: 10/16/2017 00:29    Lab Data:  CBC: Recent Labs  Lab 10/15/17 1915 10/16/17 0408 10/17/17 0853 10/18/17 0539  WBC 9.5 8.2 9.1 7.7  HGB 11.9* 10.0* 10.6* 9.9*  HCT 34.0* 28.9* 30.3* 28.6*  MCV 81.1 81.2 81.9 81.3  PLT 262 218 214 034   Basic Metabolic Panel: Recent Labs  Lab 10/17/17 0853 10/17/17 1219 10/17/17 1857 10/18/17 0033 10/18/17 0539 10/18/17 0922  NA 114* 114* 113*  114* 116* 117*  K 4.5 4.5 4.2 4.0 4.9  --   CL 86* 87* 84* 86* 86*  --   CO2 19* 21* 21* 22 24  --   GLUCOSE 169* 118* 138* 88 95  --   BUN 7 6 7 7 7   --   CREATININE 1.20 1.27* 1.21 1.07 1.16  --   CALCIUM 8.6* 8.2* 8.3* 8.3* 8.7*  --    GFR: Estimated Creatinine Clearance: 57.7 mL/min (by C-G formula based on SCr of 1.16 mg/dL). Liver Function Tests: Recent Labs  Lab 10/15/17 1915 10/16/17 0408  AST 44* 36  ALT 55 44  ALKPHOS 77 63  BILITOT 0.7 0.5  PROT 7.3 5.7*  ALBUMIN 4.2 3.4*   Recent Labs  Lab 10/15/17 1915  LIPASE 44   No results for input(s): AMMONIA in the last 168 hours. Coagulation Profile: No results for input(s): INR, PROTIME in the last 168 hours. Cardiac Enzymes: Recent Labs  Lab 10/15/17 2327 10/16/17 0408 10/16/17 1128  TROPONINI <0.03 <0.03 <0.03   BNP (last 3 results) No results for input(s): PROBNP in the last 8760 hours. HbA1C: Recent Labs    10/16/17 0408  HGBA1C 6.3*   CBG: Recent Labs  Lab 10/16/17 2131 10/17/17 0752 10/17/17 1748 10/17/17 1954 10/18/17 0804  GLUCAP 93 92 93 133* 104*   Lipid Profile: No results for input(s): CHOL, HDL, LDLCALC, TRIG, CHOLHDL, LDLDIRECT in the last 72 hours. Thyroid Function Tests: Recent Labs    10/15/17 2327  TSH 0.628   Anemia Panel: No results for input(s): VITAMINB12, FOLATE, FERRITIN, TIBC, IRON, RETICCTPCT in the last 72 hours. Urine analysis:    Component Value Date/Time   COLORURINE YELLOW 10/16/2017 0548   APPEARANCEUR HAZY (A) 10/16/2017 0548   LABSPEC 1.010 10/16/2017 0548   PHURINE 7.0 10/16/2017 0548   GLUCOSEU NEGATIVE 10/16/2017 0548   HGBUR NEGATIVE 10/16/2017 0548   BILIRUBINUR NEGATIVE 10/16/2017 0548   KETONESUR NEGATIVE 10/16/2017 0548   PROTEINUR NEGATIVE 10/16/2017 0548   NITRITE NEGATIVE 10/16/2017 0548   LEUKOCYTESUR NEGATIVE 10/16/2017 0548     Barnes Florek M.D. [facility] 10/18/2017, 10:55 AM  Pager: [phone] Between 7am to 7pm - call  Pager - [phone]  After 7pm go to [email or web address] - password [facility]  Call night coverage person covering after 7pm

## 2017-10-19 DIAGNOSIS — I484 Atypical atrial flutter: Secondary | ICD-10-CM

## 2017-10-19 DIAGNOSIS — E1159 Type 2 diabetes mellitus with other circulatory complications: Secondary | ICD-10-CM

## 2017-10-19 DIAGNOSIS — I5042 Chronic combined systolic (congestive) and diastolic (congestive) heart failure: Secondary | ICD-10-CM

## 2017-10-19 DIAGNOSIS — I25110 Atherosclerotic heart disease of native coronary artery with unstable angina pectoris: Secondary | ICD-10-CM

## 2017-10-19 LAB — CBC
HCT: 29.4 % — ABNORMAL LOW (ref 39.0–52.0)
Hemoglobin: 10.1 g/dL — ABNORMAL LOW (ref 13.0–17.0)
MCH: 28.3 pg (ref 26.0–34.0)
MCHC: 34.4 g/dL (ref 30.0–36.0)
MCV: 82.4 fL (ref 78.0–100.0)
PLATELETS: 194 10*3/uL (ref 150–400)
RBC: 3.57 MIL/uL — AB (ref 4.22–5.81)
RDW: 12.8 % (ref 11.5–15.5)
WBC: 6.9 10*3/uL (ref 4.0–10.5)

## 2017-10-19 LAB — COMPREHENSIVE METABOLIC PANEL
ALT: 41 U/L (ref 17–63)
AST: 34 U/L (ref 15–41)
Albumin: 4 g/dL (ref 3.5–5.0)
Alkaline Phosphatase: 81 U/L (ref 38–126)
Anion gap: 7 (ref 5–15)
BUN: 10 mg/dL (ref 6–20)
CHLORIDE: 97 mmol/L — AB (ref 101–111)
CO2: 25 mmol/L (ref 22–32)
CREATININE: 1.42 mg/dL — AB (ref 0.61–1.24)
Calcium: 9.5 mg/dL (ref 8.9–10.3)
GFR calc Af Amer: 54 mL/min — ABNORMAL LOW (ref >60)
GFR calc non Af Amer: 46 mL/min — ABNORMAL LOW (ref >60)
Glucose, Bld: 125 mg/dL — ABNORMAL HIGH (ref 65–99)
Potassium: 4.7 mmol/L (ref 3.5–5.1)
SODIUM: 129 mmol/L — AB (ref 135–145)
Total Bilirubin: 0.8 mg/dL (ref 0.3–1.2)
Total Protein: 6.6 g/dL (ref 6.5–8.1)

## 2017-10-19 LAB — BASIC METABOLIC PANEL
ANION GAP: 8 (ref 5–15)
BUN: 12 mg/dL (ref 6–20)
CALCIUM: 9.5 mg/dL (ref 8.9–10.3)
CO2: 24 mmol/L (ref 22–32)
Chloride: 97 mmol/L — ABNORMAL LOW (ref 101–111)
Creatinine, Ser: 1.75 mg/dL — ABNORMAL HIGH (ref 0.61–1.24)
GFR calc non Af Amer: 36 mL/min — ABNORMAL LOW (ref >60)
GFR, EST AFRICAN AMERICAN: 42 mL/min — AB (ref >60)
Glucose, Bld: 153 mg/dL — ABNORMAL HIGH (ref 65–99)
Potassium: 4.9 mmol/L (ref 3.5–5.1)
SODIUM: 129 mmol/L — AB (ref 135–145)

## 2017-10-19 LAB — GLUCOSE, CAPILLARY
GLUCOSE-CAPILLARY: 169 mg/dL — AB (ref 65–99)
Glucose-Capillary: 107 mg/dL — ABNORMAL HIGH (ref 65–99)

## 2017-10-19 LAB — SODIUM: SODIUM: 127 mmol/L — AB (ref 135–145)

## 2017-10-19 MED ORDER — FUROSEMIDE 20 MG PO TABS
20.0000 mg | ORAL_TABLET | Freq: Every day | ORAL | Status: DC
  Administered 2017-10-19: 20 mg via ORAL
  Filled 2017-10-19: qty 1

## 2017-10-19 NOTE — Care Management Note (Signed)
Case Management Note  Patient Details  Name: Gregory Bates MRN: [id number] Date of Birth: [birth year]  Subjective/Objective:     Pt admitted with emesis               Action/Plan:  PTA independent from home with wife.  Pt has PCP and denied barrier to obtaining/paying for medications.  Pt was on Eliquis prior to admit - copay is $10.  CM will continue to follow for discharge needs   Expected Discharge Date:  10/19/17               Expected Discharge Plan:  Home/Self Care  In-House Referral:     Discharge planning Services  CM Consult  Post Acute Care Choice:    Choice offered to:     DME Arranged:    DME Agency:     HH Arranged:    HH Agency:     Status of Service:     If discussed at [employer], dates discussed:    Additional Comments:  Maryclare Labrador, RN 10/19/2017, 11:37 AM

## 2017-10-19 NOTE — Progress Notes (Signed)
Signed out against med advise despite explanation given, MD spoke with pt.

## 2017-10-19 NOTE — Discharge Summary (Signed)
Physician Discharge Summary  Gregory Bates CZY:[id number] DOB: [birth year] DOA: 10/15/2017  PCP: Cyndy Freeze, MD  Admit date: 10/15/2017 Discharge date: 10/19/2017  Admitted From: Home Disposition:  Home; Signed Out Against Medical Advice  Recommendations for Outpatient Follow-up:  1. Follow up with PCP in 1-2 weeks 2. Follow up with Gastroenterology as an outpatient EGD 3. Repeat U/S as an outpatient for Infrarenal Abdominal Aortic Aneurysm in 2 years  4. Please obtain CMP/CBC, Mag Phos in one week  Home Health: No Equipment/Devices: None  Discharge Condition: Guarded; Signed out Against Medical Advice CODE STATUS: FULL CODE Diet recommendation: Regular   Brief/Interim Summary: BruceOgleis a75 y.o.male,w Pafib , CAD s/p stent, CHF(40-45%), Hypertension, Hyperlipidemia, DM2 who apparently had n/v after eating cheeze sandwich. Pt having n/v since Wednesday. Slight dry cough since Wednesday.Pt was brought to ED for evaluation of low sodium which was found by his pcp. Pt was taken off lasix on Wednesday. Patient was being treated for Hyponatremia and Nephrology was consulted for further evaluation who recommended starting the patient on Tolvaptan. Patient's Sodium improved to 129 but Nephrology advised that it be consistently be above 130. Patient did not want to stay for continued treatment and after an extensive discussion with him, he decided he wanted to go home and signed out against medical advice. He will need to follow up with PCP for repeat CMP and follow up with Gastroenterology as an outpatient.   Discharge Diagnoses:  Principal Problem:   Hyponatremia Active Problems:   Type 2 diabetes mellitus with vascular disease (HCC)   Atrial flutter (HCC)   Chronic combined systolic and diastolic CHF (congestive heart failure) (HCC)   Nausea & vomiting  Acute Hyponatremia -Unclear etiology, also having nausea and vomiting for last 3 days, presented with sodium of 113.  Not symptomatic, patient alert and oriented. - Serum osmolarity low 244, urine osmolarity 389, urine sodium 94 on admission -No significant improvement with IV fluids.  -Nephrology was consulted, placed on salt tablets and fluid restriction however with no significant improvement again.  -Nephrology recommended tolvaptan yesterday, -Sodium improved to 129 -Nephrology recommended ensuring Sodium Level persistently >130 -Repeat BMP drawn this afternoon but patient signed out Against Medical Advice as he no longer wanted to remain in the hospital for treatment.   Nausea and Vomiting -Unclear etiology, CT abdomen pelvis showed no acute abnormality. Aneurysmal dilatation of infrarenal abdominal aorta 3.9 cm seen incidentally,possibly could have gastroparesis due to history of diabetes. -Patient however having persistent nausea for last 3 months, had episode of vomiting this morning.  -He was scheduled to have outpatient endoscopy today at [facility] with his gastroenterologist, Dr. Lyndel Safe.  -GI consult called and EGD deferred this Admission by Dr. Carlean Purl -Follow up with GI as an outpatient as patient signed out Against Medical Advice   Infrarenal abdominal aorta aneurysm -Incidentally CT abdomen showed aneurysmal dilatation of infrarenal abdominal aorta to 3.9 cm,  -Recommended follow-up ultrasound in 2 years.  Chronic Combined systolic and diastolic CHF -2D echo 0/93/23, showed EF of 45%-50%, akinesis and scarring of the basal mid inferior myocardium, grade 2 diastolic dysfunction -Held Lasix since 12/12 and lisinopril placed on hold on 12/15 but Lasix restarted by Nephrology today  -Continue Metoprolol, Plavix, Imdur, Amlodipine, Lipitor -There was a plan of cardiac cath outpatient, discussed with Dr. Domenic Polite (on-call cardiology), cath has been rescheduled to November 12, 2017, relayed to patient and his wife. -Resume diuretics today as sodium level has improved to 129 after tolvaptan;  Nephrology and I  advised for patient to remain inpatient so sodium level would be >130 but patient being of sound mind did not want to stay and signed out AMA  Paroxysmal atrial fibrillation -Currently rate controlled; Continue Home Metoprolol, Amiodarone -Mali vasc 4, Continue Eliquis for anticoagulation, will need to hold for EGD if planned by GI  Diabetes Mellitus -CBG stable and ranging from 99-107 -Continued sliding scale insulin while hospitalized   Mechanical Fall in the setting of Hyponatremia -Patient had mechanical fallon 12/15, notified by RN, had hit his head on the table, CT head negative. -No neurological deficits.  Discharge Instructions Discharge Instructions    (HEART FAILURE PATIENTS) Call MD:  Anytime you have any of the following symptoms: 1) 3 pound weight gain in 24 hours or 5 pounds in 1 week 2) shortness of breath, with or without a dry hacking cough 3) swelling in the hands, feet or stomach 4) if you have to sleep on extra pillows at night in order to breathe.   Complete by:  As directed    Call MD for:  difficulty breathing, headache or visual disturbances   Complete by:  As directed    Call MD for:  extreme fatigue   Complete by:  As directed    Call MD for:  hives   Complete by:  As directed    Call MD for:  persistant dizziness or light-headedness   Complete by:  As directed    Call MD for:  persistant nausea and vomiting   Complete by:  As directed    Call MD for:  redness, tenderness, or signs of infection (pain, swelling, redness, odor or green/yellow discharge around incision site)   Complete by:  As directed    Call MD for:  severe uncontrolled pain   Complete by:  As directed    Call MD for:  temperature >100.4   Complete by:  As directed    Diet general   Complete by:  As directed    Fluid Restrict to 1200 mL   Discharge instructions   Complete by:  As directed    Follow up with PCP and Gastroenterology as an outpatient. Take all  medications as prescribed. If symptoms change or worsen please return to the ED for evaluation.   Increase activity slowly   Complete by:  As directed      Allergies as of 10/19/2017      Reactions   Brilinta [ticagrelor] Other (See Comments)   Arthralgias & myalgias   Crestor [rosuvastatin] Other (See Comments)   Myalgias      Medication List    TAKE these medications   amiodarone 200 MG tablet Commonly known as:  PACERONE Take 1 tablet (200 mg total) by mouth daily.   amLODipine 5 MG tablet Commonly known as:  NORVASC TAKE 1 TABLET EVERY DAY   atorvastatin 20 MG tablet Commonly known as:  LIPITOR Take 20 mg by mouth at bedtime.   Chelated Potassium 99 MG Tabs Take 99 mg by mouth daily as needed (supplementation).   cholecalciferol 1000 units tablet Commonly known as:  VITAMIN D Take 1,000 Units by mouth every evening.   clopidogrel 75 MG tablet Commonly known as:  PLAVIX Take 1 tablet (75 mg total) by mouth daily.   ELIQUIS 5 MG Tabs tablet Generic drug:  apixaban TAKE ONE TABLET BY MOUTH TWICE DAILY   fexofenadine 180 MG tablet Commonly known as:  ALLEGRA Take 180 mg by mouth daily as needed for allergies.  furosemide 20 MG tablet Commonly known as:  LASIX Take 20 mg by mouth daily.   isosorbide mononitrate 60 MG 24 hr tablet Commonly known as:  IMDUR Take 1 tablet (60 mg total) by mouth daily.   lisinopril 20 MG tablet Commonly known as:  PRINIVIL,ZESTRIL TAKE 1 TABLET EVERY DAY   metoprolol tartrate 25 MG tablet Commonly known as:  LOPRESSOR Take 1 tablet (25 mg total) by mouth 2 (two) times daily.   multivitamin tablet Take 1 tablet by mouth at bedtime.   nitroGLYCERIN 0.4 MG SL tablet Commonly known as:  NITROSTAT Place 0.4 mg under the tongue every 5 (five) minutes as needed for chest pain.   ondansetron 8 MG tablet Commonly known as:  ZOFRAN Take 8 mg by mouth every 8 (eight) hours as needed for nausea or vomiting.   vitamin E 100  UNIT capsule Take 100 Units by mouth at bedtime.       Allergies  Allergen Reactions  . Brilinta [Ticagrelor] Other (See Comments)    Arthralgias & myalgias  . Crestor [Rosuvastatin] Other (See Comments)    Myalgias   Consultations:  Nephrology Dr. Elmarie Shiley  Gastroenterology Dr. Carlean Purl  Procedures/Studies: Ct Abdomen Pelvis Wo Contrast  Result Date: 10/16/2017 CLINICAL DATA:  Acute onset of nausea and vomiting. EXAM: CT ABDOMEN AND PELVIS WITHOUT CONTRAST TECHNIQUE: Multidetector CT imaging of the abdomen and pelvis was performed following the standard protocol without IV contrast. COMPARISON:  Abdominal radiograph performed 10/15/2017 FINDINGS: Lower chest: The visualized lung bases are clear. Diffuse coronary artery calcifications are seen. Hepatobiliary: The liver is unremarkable in appearance. The gallbladder is unremarkable in appearance. The common bile duct remains normal in caliber. Pancreas: The pancreas is within normal limits. Spleen: The spleen is unremarkable in appearance. Adrenals/Urinary Tract: The adrenal glands are unremarkable in appearance. Mild nonspecific perinephric stranding is noted bilaterally. A right renal cyst is noted. There is no evidence of hydronephrosis. No renal or ureteral stones are identified. Stomach/Bowel: The stomach is unremarkable in appearance. The small bowel is within normal limits. The appendix is normal in caliber, without evidence of appendicitis. Mild diverticulosis is noted along the sigmoid colon, without evidence of diverticulitis. Vascular/Lymphatic: There is aneurysmal dilatation of the infrarenal abdominal aorta to 3.9 cm in AP dimension, resolving proximal to the aortic bifurcation. Diffuse calcification is seen along the abdominal aorta and its branches. No retroperitoneal or pelvic sidewall lymphadenopathy is seen. Reproductive: The bladder is significantly distended and grossly unremarkable. The prostate is normal in size. Other: A  2.2 cm nonspecific subcutaneous soft tissue nodule is noted at the left inguinal region. Musculoskeletal: No acute osseous abnormalities are identified. The visualized musculature is unremarkable in appearance. IMPRESSION: 1. No acute abnormality seen within the abdomen or pelvis. 2. **An incidental finding of potential clinical significance has been found. Aneurysmal dilatation of the infrarenal abdominal aorta to 3.9 cm in AP dimension. Recommend followup by ultrasound in 2 years. This recommendation follows ACR consensus guidelines: White Paper of the ACR Incidental Findings Committee II on Vascular Findings. J Am Coll Radiol 2013; 10:789-794.** 3. Diffuse coronary artery calcifications seen. 4. Right renal cyst noted. 5. Mild diverticulosis along the sigmoid colon, without evidence of diverticulitis. 6. 2.2 cm nonspecific subcutaneous soft tissue nodule at the left inguinal region. Aortic Atherosclerosis (ICD10-I70.0). Electronically Signed   By: Garald Balding M.D.   On: 10/16/2017 05:59   Dg Chest 2 View  Result Date: 10/16/2017 CLINICAL DATA:  Acute onset of nausea and vomiting. EXAM:  CHEST  2 VIEW COMPARISON:  Chest radiograph performed 04/17/2017 FINDINGS: The lungs are well-aerated and clear. There is no evidence of focal opacification, pleural effusion or pneumothorax. The heart is normal in size; the mediastinal contour is within normal limits. No acute osseous abnormalities are seen. IMPRESSION: No acute cardiopulmonary process seen. Electronically Signed   By: Garald Balding M.D.   On: 10/16/2017 04:07   Ct Head Wo Contrast  Result Date: 10/16/2017 CLINICAL DATA:  Fall, hit head EXAM: CT HEAD WITHOUT CONTRAST TECHNIQUE: Contiguous axial images were obtained from the base of the skull through the vertex without intravenous contrast. COMPARISON:  10/31/2003 FINDINGS: Brain: Calcified mass in the left temporal region measures 2.3 x 1.8 cm most compatible with calcified meningioma. There is  atrophy and chronic small vessel disease changes. No hemorrhage, hydrocephalus or acute infarction. Vascular: No hyperdense vessel or unexpected calcification. Skull: No acute calvarial abnormality. Sinuses/Orbits: No acute finding Other: None IMPRESSION: Left temporal calcified mass compatible with meningioma. No acute intracranial abnormality. Atrophy, chronic microvascular disease. Electronically Signed   By: Rolm Baptise M.D.   On: 10/16/2017 15:58   Dg Abd 2 Views  Result Date: 10/16/2017 CLINICAL DATA:  Acute onset of nausea and vomiting. EXAM: ABDOMEN - 2 VIEW COMPARISON:  None. FINDINGS: The visualized bowel gas pattern is unremarkable. Scattered air and stool filled loops of colon are seen; no abnormal dilatation of small bowel loops is seen to suggest small bowel obstruction. No free intra-abdominal air is identified, though evaluation for free air is limited on a single supine view. The visualized osseous structures are within normal limits; the sacroiliac joints are unremarkable in appearance. The visualized lung bases are essentially clear. IMPRESSION: Unremarkable bowel gas pattern; no free intra-abdominal air seen. Moderate amount of stool noted in the colon. Electronically Signed   By: Garald Balding M.D.   On: 10/16/2017 00:29    Subjective: Seen and examined and was agitated and wanted to go home. Discussed with him that his sodium was still low and that Nephrology had advised that sodium be >130 persistently but patient being of sound mind refused to remain in the hospital and did not want to stay. He understood the risks of leaving against medical advice and proceeded to sign out AMA.  Discharge Exam: Vitals:   10/19/17 1151 10/19/17 1200  BP: (!) 90/51   Pulse:    Resp: 18   Temp: 98.2 F (36.8 C)   SpO2:  98%   Vitals:   10/19/17 0700 10/19/17 0942 10/19/17 1151 10/19/17 1200  BP: (!) 98/55  (!) 90/51   Pulse:  72    Resp:   18   Temp: 97.9 F (36.6 C)  98.2 F (36.8  C)   TempSrc: Oral  Oral   SpO2: 98%   98%  Weight:      Height:       General: Pt is alert, awake, not in acute distress Cardiovascular: RRR, S1/S2 +, no rubs, no gallops Respiratory: Diminished bilaterally, no wheezing, no rhonchi Abdominal: Soft, NT, ND, bowel sounds + Extremities: no edema, no cyanosis  The results of significant diagnostics from this hospitalization (including imaging, microbiology, ancillary and laboratory) are listed below for reference.    Microbiology: Recent Results (from the past 240 hour(s))  MRSA PCR Screening     Status: None   Collection Time: 10/16/17  2:40 PM  Result Value Ref Range Status   MRSA by PCR NEGATIVE NEGATIVE Final    Comment:  The GeneXpert MRSA Assay (FDA approved for NASAL specimens only), is one component of a comprehensive MRSA colonization surveillance program. It is not intended to diagnose MRSA infection nor to guide or monitor treatment for MRSA infections.     Labs: BNP (last 3 results) Recent Labs    04/17/17 1324  BNP 254.2*   Basic Metabolic Panel: Recent Labs  Lab 10/18/17 0033 10/18/17 0539 10/18/17 0922 10/18/17 1215 10/18/17 1631 10/19/17 0045 10/19/17 0759  NA 114* 116* 117* 117* 121* 127* 129*  K 4.0 4.9  --  4.3 4.6  --  4.7  CL 86* 86*  --  85* 89*  --  97*  CO2 22 24  --  24 23  --  25  GLUCOSE 88 95  --  107* 102*  --  125*  BUN 7 7  --  6 9  --  10  CREATININE 1.07 1.16  --  1.28* 1.28*  --  1.42*  CALCIUM 8.3* 8.7*  --  8.7* 8.9  --  9.5   Liver Function Tests: Recent Labs  Lab 10/15/17 1915 10/16/17 0408 10/19/17 0759  AST 44* 36 34  ALT 55 44 41  ALKPHOS 77 63 81  BILITOT 0.7 0.5 0.8  PROT 7.3 5.7* 6.6  ALBUMIN 4.2 3.4* 4.0   Recent Labs  Lab 10/15/17 1915  LIPASE 44   No results for input(s): AMMONIA in the last 168 hours. CBC: Recent Labs  Lab 10/15/17 1915 10/16/17 0408 10/17/17 0853 10/18/17 0539 10/19/17 0045  WBC 9.5 8.2 9.1 7.7 6.9  HGB 11.9*  10.0* 10.6* 9.9* 10.1*  HCT 34.0* 28.9* 30.3* 28.6* 29.4*  MCV 81.1 81.2 81.9 81.3 82.4  PLT 262 218 214 207 194   Cardiac Enzymes: Recent Labs  Lab 10/15/17 2327 10/16/17 0408 10/16/17 1128  TROPONINI <0.03 <0.03 <0.03   BNP: Invalid input(s): POCBNP CBG: Recent Labs  Lab 10/18/17 0804 10/18/17 1234 10/18/17 1706 10/18/17 2103 10/19/17 0755  GLUCAP 104* 106* 99 137* 107*   D-Dimer No results for input(s): DDIMER in the last 72 hours. Hgb A1c No results for input(s): HGBA1C in the last 72 hours. Lipid Profile No results for input(s): CHOL, HDL, LDLCALC, TRIG, CHOLHDL, LDLDIRECT in the last 72 hours. Thyroid function studies No results for input(s): TSH, T4TOTAL, T3FREE, THYROIDAB in the last 72 hours.  Invalid input(s): FREET3 Anemia work up No results for input(s): VITAMINB12, FOLATE, FERRITIN, TIBC, IRON, RETICCTPCT in the last 72 hours. Urinalysis    Component Value Date/Time   COLORURINE YELLOW 10/16/2017 0548   APPEARANCEUR HAZY (A) 10/16/2017 0548   LABSPEC 1.010 10/16/2017 0548   PHURINE 7.0 10/16/2017 0548   GLUCOSEU NEGATIVE 10/16/2017 0548   HGBUR NEGATIVE 10/16/2017 0548   BILIRUBINUR NEGATIVE 10/16/2017 0548   KETONESUR NEGATIVE 10/16/2017 0548   PROTEINUR NEGATIVE 10/16/2017 0548   NITRITE NEGATIVE 10/16/2017 0548   LEUKOCYTESUR NEGATIVE 10/16/2017 0548   Sepsis Labs Invalid input(s): PROCALCITONIN,  WBC,  LACTICIDVEN Microbiology Recent Results (from the past 240 hour(s))  MRSA PCR Screening     Status: None   Collection Time: 10/16/17  2:40 PM  Result Value Ref Range Status   MRSA by PCR NEGATIVE NEGATIVE Final    Comment:        The GeneXpert MRSA Assay (FDA approved for NASAL specimens only), is one component of a comprehensive MRSA colonization surveillance program. It is not intended to diagnose MRSA infection nor to guide or monitor treatment for MRSA infections.  Time coordinating discharge: 25 minutes  SIGNED:  Kerney Elbe, DO [facility] 10/19/2017, 3:06 PM Pager [phone]  If 7PM-7AM, please contact night-coverage [email or web address] Password [email or web address]

## 2017-10-19 NOTE — Progress Notes (Signed)
Patient ID: Gregory Bates, male   DOB: 1941-02-01, 76 y.o.   MRN: [id number]  [facility] Progress Note   Assessment/ Plan:   1. Hyponatremia: Euvolemic on physical exam and labs suggestive of inappropriate ADH secretion likely coupled with poor solute/increased fluid intake. Sodium level up by 10 mmol/liter over the past 24 hours which is within the high acceptable limit for acute hyponatremia. At this point, discontinue tolvaptan and switch him over to furosemide once a day. He really wants to go home in spite of my discussions with him regarding the risks of doing this with sodium level still in flux. If labs show sodium level persistently >130, would be okay to DC home with lab follow-up at his primary care provider's office. Suspect this is an acute and reversible hyponatremia event given presentation history. 2. Nausea/vomiting: Seen yesterday by gastroenterology and EGD deferred due to symptomatic improvement and patient choice. 3. History of congestive heart failure: Appears to be compensated, resume diuretics and discontinue Tolvaptan.  4. History of coronary artery disease  Subjective:   Denies any complaints and was able to eat and drink overnight without problems. Very frustrated that he still in the hospital and inquires about being discharged.  Objective:   BP (!) 98/55 (BP Location: Left Arm)   Pulse 80   Temp 97.9 F (36.6 C) (Oral)   Resp (!) 22   Ht 5\' 11"  (1.803 m)   Wt 80.1 kg (176 lb 9.4 oz)   SpO2 98%   BMI 24.63 kg/m   Intake/Output Summary (Last 24 hours) at 10/19/2017 0818 Last data filed at 10/19/2017 0541 Gross per 24 hour  Intake 940 ml  Output 1475 ml  Net -535 ml   Weight change: -5.176 kg (-6.6 oz)  Physical Exam: Gen: Comfortably sitting up in a recliner, listening to television CVS: Pulse regular rhythm, normal rate, S1 and S2 normal Resp: Clear to auscultation bilaterally, no rales/rhonchi Abd: Soft, obese, nontender Ext: No lower  extremity edema  Imaging: No results found.  Labs: BMET Recent Labs  Lab 10/17/17 0853 10/17/17 1219 10/17/17 1857 10/18/17 0033 10/18/17 0539 10/18/17 0922 10/18/17 1215 10/18/17 1631 10/19/17 0045  NA 114* 114* 113* 114* 116* 117* 117* 121* 127*  K 4.5 4.5 4.2 4.0 4.9  --  4.3 4.6  --   CL 86* 87* 84* 86* 86*  --  85* 89*  --   CO2 19* 21* 21* 22 24  --  24 23  --   GLUCOSE 169* 118* 138* 88 95  --  107* 102*  --   BUN 7 6 7 7 7   --  6 9  --   CREATININE 1.20 1.27* 1.21 1.07 1.16  --  1.28* 1.28*  --   CALCIUM 8.6* 8.2* 8.3* 8.3* 8.7*  --  8.7* 8.9  --    CBC Recent Labs  Lab 10/16/17 0408 10/17/17 0853 10/18/17 0539 10/19/17 0045  WBC 8.2 9.1 7.7 6.9  HGB 10.0* 10.6* 9.9* 10.1*  HCT 28.9* 30.3* 28.6* 29.4*  MCV 81.2 81.9 81.3 82.4  PLT 218 214 207 194    Medications:    . amiodarone  200 mg Oral Daily  . amLODipine  5 mg Oral Daily  . apixaban  5 mg Oral BID  . atorvastatin  20 mg Oral QHS  . clopidogrel  75 mg Oral Daily  . insulin aspart  0-5 Units Subcutaneous QHS  . insulin aspart  0-9 Units Subcutaneous TID WC  .  isosorbide mononitrate  60 mg Oral Daily  . loratadine  10 mg Oral Daily  . metoprolol tartrate  25 mg Oral BID  . multivitamin with minerals  1 tablet Oral QHS  . pantoprazole  40 mg Oral BID  . pneumococcal 23 valent vaccine  0.5 mL Intramuscular Tomorrow-1000  . sodium chloride flush  3 mL Intravenous Q12H  . tolvaptan  15 mg Oral Q24H   Elmarie Shiley, MD 10/19/2017, 8:18 AM

## 2017-10-20 ENCOUNTER — Telehealth: Payer: Self-pay | Admitting: Cardiovascular Disease

## 2017-10-20 NOTE — Telephone Encounter (Signed)
I spoke with pt. He is asking if Dr. Angelena Form did catheterizations at any hospital other than [facility].  Pt refuses to go to [facility] because he does not want to stay overnight there.  I told pt Dr. Angelena Form only did this procedure at [facility].  Pt would like to cancel cath.  He would like to continue to follow with Dr. Angelena Form but is asking if any providers in our practice do catheterizations at other hospitals.  I told pt I would forward to Dr. Angelena Form for review/advice.  Pt aware Dr. Angelena Form will not be back in office until the end of next week. Pt will plan on following up with Richardson Dopp, PA on 1/18 as previously planned.  Cath lab notified to cancel cath.

## 2017-10-20 NOTE — Telephone Encounter (Signed)
Pt would like to schedule his CATH please give him a call back.

## 2017-10-27 NOTE — Telephone Encounter (Signed)
F/U Call:  Patient called back and cancelled appt with Richardson Dopp. Patient would like to reschedule the cath.

## 2017-10-27 NOTE — Telephone Encounter (Signed)
Returned call to patient. He is cancelling appt with Gregory Bates and scheduled cath due to stomach issues that he would like to follow up with before moving forward with the cath. Patient stated he would call back to reschedule cath.

## 2017-10-28 NOTE — Telephone Encounter (Signed)
OK. Thanks.

## 2017-11-02 DIAGNOSIS — S022XXA Fracture of nasal bones, initial encounter for closed fracture: Secondary | ICD-10-CM

## 2017-11-02 DIAGNOSIS — D62 Acute posthemorrhagic anemia: Secondary | ICD-10-CM | POA: Diagnosis not present

## 2017-11-02 DIAGNOSIS — I25110 Atherosclerotic heart disease of native coronary artery with unstable angina pectoris: Secondary | ICD-10-CM | POA: Diagnosis not present

## 2017-11-02 DIAGNOSIS — S0240FA Zygomatic fracture, left side, initial encounter for closed fracture: Secondary | ICD-10-CM

## 2017-11-02 DIAGNOSIS — S06300A Unspecified focal traumatic brain injury without loss of consciousness, initial encounter: Secondary | ICD-10-CM | POA: Diagnosis not present

## 2017-11-02 DIAGNOSIS — S0232XA Fracture of orbital floor, left side, initial encounter for closed fracture: Secondary | ICD-10-CM | POA: Diagnosis not present

## 2017-11-02 DIAGNOSIS — S0240DA Maxillary fracture, left side, initial encounter for closed fracture: Secondary | ICD-10-CM | POA: Diagnosis not present

## 2017-11-02 DIAGNOSIS — R040 Epistaxis: Secondary | ICD-10-CM

## 2017-11-02 DIAGNOSIS — I480 Paroxysmal atrial fibrillation: Secondary | ICD-10-CM | POA: Diagnosis not present

## 2017-11-02 DIAGNOSIS — I5032 Chronic diastolic (congestive) heart failure: Secondary | ICD-10-CM | POA: Diagnosis not present

## 2017-11-02 DIAGNOSIS — E222 Syndrome of inappropriate secretion of antidiuretic hormone: Secondary | ICD-10-CM | POA: Diagnosis not present

## 2017-11-03 DIAGNOSIS — S0232XA Fracture of orbital floor, left side, initial encounter for closed fracture: Secondary | ICD-10-CM | POA: Diagnosis not present

## 2017-11-03 DIAGNOSIS — I4891 Unspecified atrial fibrillation: Secondary | ICD-10-CM | POA: Diagnosis not present

## 2017-11-03 DIAGNOSIS — S022XXA Fracture of nasal bones, initial encounter for closed fracture: Secondary | ICD-10-CM | POA: Diagnosis not present

## 2017-11-03 DIAGNOSIS — S0240FA Zygomatic fracture, left side, initial encounter for closed fracture: Secondary | ICD-10-CM | POA: Diagnosis not present

## 2017-11-03 DIAGNOSIS — R040 Epistaxis: Secondary | ICD-10-CM | POA: Diagnosis not present

## 2017-11-04 DIAGNOSIS — I4891 Unspecified atrial fibrillation: Secondary | ICD-10-CM | POA: Diagnosis not present

## 2017-11-04 DIAGNOSIS — S0240FA Zygomatic fracture, left side, initial encounter for closed fracture: Secondary | ICD-10-CM | POA: Diagnosis not present

## 2017-11-04 DIAGNOSIS — R040 Epistaxis: Secondary | ICD-10-CM | POA: Diagnosis not present

## 2017-11-04 DIAGNOSIS — S0232XA Fracture of orbital floor, left side, initial encounter for closed fracture: Secondary | ICD-10-CM | POA: Diagnosis not present

## 2017-11-04 DIAGNOSIS — S022XXA Fracture of nasal bones, initial encounter for closed fracture: Secondary | ICD-10-CM | POA: Diagnosis not present

## 2017-11-05 DIAGNOSIS — S0240FA Zygomatic fracture, left side, initial encounter for closed fracture: Secondary | ICD-10-CM | POA: Diagnosis not present

## 2017-11-05 DIAGNOSIS — S0232XA Fracture of orbital floor, left side, initial encounter for closed fracture: Secondary | ICD-10-CM | POA: Diagnosis not present

## 2017-11-05 DIAGNOSIS — S022XXA Fracture of nasal bones, initial encounter for closed fracture: Secondary | ICD-10-CM | POA: Diagnosis not present

## 2017-11-05 DIAGNOSIS — R040 Epistaxis: Secondary | ICD-10-CM | POA: Diagnosis not present

## 2017-11-08 ENCOUNTER — Other Ambulatory Visit: Payer: BLUE CROSS/BLUE SHIELD

## 2017-11-10 ENCOUNTER — Telehealth: Payer: Self-pay | Admitting: Cardiovascular Disease

## 2017-11-10 ENCOUNTER — Telehealth: Payer: Self-pay | Admitting: Physician Assistant

## 2017-11-10 NOTE — Telephone Encounter (Signed)
New Message   Patient is requesting a calling requesting a nurse. He states that he was discharged from the hospital. While in the hospital he was taken off the Plavix and Eliquis. He wants to know when he can start the Eliquis again. He has already started the Plavix. Please call.

## 2017-11-10 NOTE — Telephone Encounter (Signed)
New message  Pt verbalized that he is calling for RN  Pt did not disclose reason

## 2017-11-10 NOTE — Telephone Encounter (Signed)
I spoke with pt who reports he was recently hospitalized in [place] following a fall.  Eliquis and Plavix were stopped until he followed up with primary MD.  He saw primary care today and Plavix was resumed. He was told not to resume Eliquis until seen by Dr. Angelena Form. I told him to follow instructions as outlined by primary care and scheduled pt to see Dr. Angelena Form on 11/17/17 at 4:00.  Will request records from [facility].

## 2017-11-12 ENCOUNTER — Ambulatory Visit (HOSPITAL_COMMUNITY): Admit: 2017-11-12 | Payer: BLUE CROSS/BLUE SHIELD | Admitting: Cardiovascular Disease

## 2017-11-12 ENCOUNTER — Encounter (HOSPITAL_COMMUNITY): Payer: Self-pay

## 2017-11-12 SURGERY — LEFT HEART CATH AND CORONARY ANGIOGRAPHY
Anesthesia: LOCAL

## 2017-11-12 NOTE — Telephone Encounter (Signed)
I returned pt's call who states he has an appt to see Dr. Angelena Form 11/17/17, said he just got out of the hospital. Pt thanked me though for calling him.

## 2017-11-16 ENCOUNTER — Encounter: Payer: Self-pay | Admitting: Cardiovascular Disease

## 2017-11-17 ENCOUNTER — Encounter: Payer: Self-pay | Admitting: Cardiovascular Disease

## 2017-11-17 ENCOUNTER — Ambulatory Visit (INDEPENDENT_AMBULATORY_CARE_PROVIDER_SITE_OTHER): Payer: BLUE CROSS/BLUE SHIELD | Admitting: Cardiovascular Disease

## 2017-11-17 VITALS — BP 126/60 | HR 59 | Ht 71.0 in | Wt 182.0 lb

## 2017-11-17 DIAGNOSIS — I5042 Chronic combined systolic (congestive) and diastolic (congestive) heart failure: Secondary | ICD-10-CM | POA: Diagnosis not present

## 2017-11-17 DIAGNOSIS — I10 Essential (primary) hypertension: Secondary | ICD-10-CM | POA: Diagnosis not present

## 2017-11-17 DIAGNOSIS — E871 Hypo-osmolality and hyponatremia: Secondary | ICD-10-CM

## 2017-11-17 DIAGNOSIS — I25119 Atherosclerotic heart disease of native coronary artery with unspecified angina pectoris: Secondary | ICD-10-CM | POA: Diagnosis not present

## 2017-11-17 DIAGNOSIS — E785 Hyperlipidemia, unspecified: Secondary | ICD-10-CM

## 2017-11-17 DIAGNOSIS — I480 Paroxysmal atrial fibrillation: Secondary | ICD-10-CM | POA: Diagnosis not present

## 2017-11-17 MED ORDER — APIXABAN 5 MG PO TABS: 5.0000 mg | ORAL_TABLET | Freq: Two times a day (BID) | ORAL | 1 refills | Status: DC

## 2017-11-17 NOTE — Progress Notes (Signed)
Chief Complaint  Patient presents with  . Coronary Artery Disease     History of Present Illness: 77 yo male with history of DM, HTN, hyperlipidemia, atrial fib/flutter, chronic diastolic CHF and CAD here today for cardiac follow up. He has CAD and was admitted for the first time in 2013 with an inferior STEMI and the occluded RCA was treated with a drug eluting stent. He was readmitted in 2014 with a NSTEMI and found to have a severe stenosis in the OM branch treated with a DES. First episode of atrial flutter in 2014. Echo in 2014 with normal LV size and function.  Repeat cardiac cath in setting of chest pain in February 2015 and found to have high grade stenosis in the distal RCA treated with a drug eluting stent. He had several admissions in 2015 with atrial fib with RVR. He developed a rash on Cardizem. He has been managed with amiodarone and metoprolol and anti-coagulated with Eliquis. Repeat cardiac cath in January 2016 with stable CAD. Stress myoview 2017 with no ischemia. Echo February 2017 with LVEF=45-50%, mild AI. Mild MR. He was seen in the ED June 2018 with chest pain and troponin was negative. He was in sinus brady. Admission was recommended but he left the ED against medical advice. His chest pain resolved on Imdur. Metoprolol reduced to 25 mg po BID due to bradycardia and dizziness in June 2018. I saw him in July 2018 and he c/o chest pain. Cardiac cath July 2018 and CAD was stable. He has 3 vessel disease with moderate stenosis throughout all three vessels. I saw him in the office 10/08/17 and he had c/o dyspnea and fatigue. I arranged a repeat cardiac cath but this was cancelled when pre-cath labs showed hyponatremia. He was admitted to [facility] 10/15/17 with N/V and sodium of 113. He was seen by Nephrology and felt to have SIADH. Lisinopril and Lasix were stopped. He was placed on Tolvaptan and sodium was up to 129 before discharge. He was seen by GI and EGD was discussed but was not done.  He had a fall at home after he was tangled in the bed sheets and hit his face hard on the door frame. He was admitted to [facility] 11/02/16 after the fall and was anemic due to severe bleeding from his nose. He had urgent surgical repair of his fractured nasal septum. He was given 3 units of pRBCs. Eliquis and Plavix held. He has since been started on Plavix. Eliquis is still on hold. Echo 11/04/17 at [facility] with normal LV function, LVEF=55-60%. Mild AI, mild to moderate MR. Sodium 127 on day of discharge 11/05/17.   He is here today for follow up. The patient denies any chest pain, dyspnea, palpitations, lower extremity edema, orthopnea, PND, dizziness, near syncope or syncope. He tells me that he feels much better.     Primary Care Physician: Cyndy Freeze, MD   Past Medical History:  Diagnosis Date  . Acute renal insufficiency    a. During 11/2013 adm with atrial flutter.  . Anemia, unspecified   . CAD (coronary artery disease), native coronary artery    a. s/p cath May 2011 >> DES distal RCA;  b. 01/2013 NSTEMI >> OM1 99p (2.75x12 Promus Premier DES);  c. LHC (2/15): PCI: Promus DES to dist RCA ;  d. LHC 1/16 - dLM 20, oLAD 30, small D1 tandem 70, pOM1 stent ok, mOM 50-60, dOM bifurcation 50-60 in both vessels, AV 60-70 jailed by  stent, pRCA 86 mRCA stent ok, dRCA stent ok, PLB smal 50, dPDA 60, 80; EF 35-40 >> med Rx - PCI of PDA if angina  . Chronic combined systolic and diastolic CHF (congestive heart failure) ([place]) 12/23/2015   Echo 2/17: Mild LVH, EF 45-50%, inferior akinesis, grade 2 diastolic dysfunction, mild AI, mild MR, mild LAE, PASP 44 mmHg   . Colorectal polyps 2002  . Diabetes mellitus    AODM  . Erectile dysfunction   . History of echocardiogram    a. 01/2013 Echo: EF 55%, mid-dist inflat HK, Gr1 DD, Triv AI/TR, Mild MR.;  b.  Echo (1/15): Mild LVH, EF 55%, inferolateral hypokinesis, grade 1 diastolic dysfunction, mild AI, trivial MR, moderate LAE, PASP 36 mmHg     . Hx of cardiovascular stress test    Stress myoview 08/07/13 with LVEF 44%, inferior scar, no ischemia  . Hyperlipidemia   . Hypertension   . Ischemic cardiomyopathy    a. EF 35-40% by LHC in 1/16; b. Echo 2/17: Mild LVH, EF 45-50%, inferior akinesis, grade 2 diastolic dysfunction, mild AI, mild MR, mild LAE, PASP 44 mmHg  . Myocardial infarction ([place])   . PAF (paroxysmal atrial fibrillation) ([place])    a. Dx 11/2013. Spont conv. Placed on eliquis.    Past Surgical History:  Procedure Laterality Date  . COLONOSCOPY W/ POLYPECTOMY  2002   [facility]  . COLONOSCOPY W/ POLYPECTOMY  2004; 2007 negative  . CORONARY ANGIOPLASTY  12/26/2013  . CORONARY STENT PLACEMENT  2007, 2011  . infantile paralysis facial asymmetry    . LEFT HEART CATH AND CORONARY ANGIOGRAPHY N/A 05/18/2017   Procedure: Left Heart Cath and Coronary Angiography;  Surgeon: Martinique, Peter M, MD;  Location: [facility];  Service: Cardiovascular;  Laterality: N/A;  . LEFT HEART CATHETERIZATION WITH CORONARY ANGIOGRAM N/A 10/02/2012   Procedure: LEFT HEART CATHETERIZATION WITH CORONARY ANGIOGRAM;  Surgeon: Peter M Martinique, MD;  Location: [facility] CATH LAB;  Service: Cardiovascular;  Laterality: N/A;  . LEFT HEART CATHETERIZATION WITH CORONARY ANGIOGRAM N/A 02/20/2013   Procedure: LEFT HEART CATHETERIZATION WITH CORONARY ANGIOGRAM;  Surgeon: Burnell Blanks, MD;  Location: [facility] CATH LAB;  Service: Cardiovascular;  Laterality: N/A;  . LEFT HEART CATHETERIZATION WITH CORONARY ANGIOGRAM N/A 12/26/2013   Procedure: LEFT HEART CATHETERIZATION WITH CORONARY ANGIOGRAM;  Surgeon: Burnell Blanks, MD;  Location: [facility] CATH LAB;  Service: Cardiovascular;  Laterality: N/A;  . LEFT HEART CATHETERIZATION WITH CORONARY ANGIOGRAM N/A 11/28/2014   Procedure: LEFT HEART CATHETERIZATION WITH CORONARY ANGIOGRAM;  Surgeon: Burnell Blanks, MD;  Location: [facility] CATH LAB;  Service: Cardiovascular;  Laterality: N/A;  . PERCUTANEOUS CORONARY STENT  INTERVENTION (PCI-S)  10/02/2012   Procedure: PERCUTANEOUS CORONARY STENT INTERVENTION (PCI-S);  Surgeon: Peter M Martinique, MD;  Location: [facility] CATH LAB;  Service: Cardiovascular;;  . PERCUTANEOUS CORONARY STENT INTERVENTION (PCI-S)  02/20/2013   Procedure: PERCUTANEOUS CORONARY STENT INTERVENTION (PCI-S);  Surgeon: Burnell Blanks, MD;  Location: [facility] CATH LAB;  Service: Cardiovascular;;  . PERCUTANEOUS CORONARY STENT INTERVENTION (PCI-S)  12/26/2013   Procedure: PERCUTANEOUS CORONARY STENT INTERVENTION (PCI-S);  Surgeon: Burnell Blanks, MD;  Location: [facility] CATH LAB;  Service: Cardiovascular;;  . WISDOM TOOTH EXTRACTION      Current Outpatient Medications  Medication Sig Dispense Refill  . amiodarone (PACERONE) 200 MG tablet Take 1 tablet (200 mg total) by mouth daily. 90 tablet 2  . amLODipine (NORVASC) 5 MG tablet TAKE 1 TABLET EVERY DAY 90 tablet 3  . atorvastatin (  LIPITOR) 20 MG tablet Take 20 mg by mouth at bedtime.    . Chelated Potassium 99 MG TABS Take 99 mg by mouth daily as needed (supplementation).    . cholecalciferol (VITAMIN D) 1000 UNITS tablet Take 1,000 Units by mouth every evening.     . clopidogrel (PLAVIX) 75 MG tablet Take 1 tablet (75 mg total) by mouth daily. 90 tablet 3  . fexofenadine (ALLEGRA) 180 MG tablet Take 180 mg by mouth daily as needed for allergies.     . furosemide (LASIX) 20 MG tablet Take 20 mg by mouth daily.    . isosorbide mononitrate (IMDUR) 60 MG 24 hr tablet Take 1 tablet (60 mg total) by mouth daily. 30 tablet 6  . lisinopril (PRINIVIL,ZESTRIL) 20 MG tablet TAKE 1 TABLET EVERY DAY 90 tablet 3  . metoprolol tartrate (LOPRESSOR) 25 MG tablet Take 1 tablet (25 mg total) by mouth 2 (two) times daily. 60 tablet 6  . Multiple Vitamin (MULTIVITAMIN) tablet Take 1 tablet by mouth at bedtime.     . nitroGLYCERIN (NITROSTAT) 0.4 MG SL tablet Place 0.4 mg under the tongue every 5 (five) minutes as needed for chest pain.    [name] Kitchen ondansetron (ZOFRAN) 8 MG  tablet Take 8 mg by mouth every 8 (eight) hours as needed for nausea or vomiting.    . vitamin E 100 UNIT capsule Take 100 Units by mouth at bedtime.     [name] Kitchen apixaban (ELIQUIS) 5 MG TABS tablet Take 1 tablet (5 mg total) by mouth 2 (two) times daily. 180 tablet 1   No current facility-administered medications for this visit.     Allergies  Allergen Reactions  . Brilinta [Ticagrelor] Other (See Comments)    Arthralgias & myalgias  . Crestor [Rosuvastatin] Other (See Comments)    Myalgias    Social History   Socioeconomic History  . Marital status: Married    Spouse name: Not on file  . Number of children: 1  . Years of education: Not on file  . Highest education level: Not on file  Social Needs  . Financial resource strain: Not on file  . Food insecurity - worry: Not on file  . Food insecurity - inability: Not on file  . Transportation needs - medical: Not on file  . Transportation needs - non-medical: Not on file  Occupational History  . Occupation: maintenance    Comment: full time  Tobacco Use  . Smoking status: Former Smoker    Last attempt to quit: 11/02/1984    Years since quitting: 33.0  . Smokeless tobacco: Never Used  . Tobacco comment: smoked [place], up to 3 ppd (!)  Substance and Sexual Activity  . Alcohol use: No  . Drug use: No  . Sexual activity: Not on file  Other Topics Concern  . Not on file  Social History Narrative   [employer] - does maintenance    Family History  Problem Relation Age of Onset  . Heart attack Brother 76  . Colon cancer Brother   . Diabetes Neg Hx   . Stroke Neg Hx   . Hypertension Neg Hx     Review of Systems:  As stated in the HPI and otherwise negative.   BP 126/60   Pulse (!) 59   Ht 5\' 11"  (1.803 m)   Wt 182 lb (82.6 kg)   SpO2 98%   BMI 25.38 kg/m   Physical Examination:  General: Well developed, well nourished, NAD  HEENT:  OP clear, mucus membranes moist  SKIN: warm, dry. No rashes. Neuro: No focal  deficits  Musculoskeletal: Muscle strength 5/5 all ext  Psychiatric: Mood and affect normal  Neck: No JVD, no carotid bruits, no thyromegaly, no lymphadenopathy.  Lungs:Clear bilaterally, no wheezes, rhonci, crackles Cardiovascular: Regular rate and rhythm. No murmurs, gallops or rubs. Abdomen:Soft. Bowel sounds present. Non-tender.  Extremities: No lower extremity edema. Pulses are 2 + in the bilateral DP/PT.  Cardiac cath 05/18/17:  1. 3 vessel obstructive CAD    - 40% distal left main    - 50% ostial LAD. The LAD is relatively small.    - diffuse 70% mid first diagonal. This is a large branch    - 75% mid OM 1. Stent in the proximal vessel is still patent    - 80% distal LCx after OM1    - 60% proximal RCA, stents in the mid and distal RCA are patent    - 90% PDA 2. Moderate LV dysfunction with inferior HK. EF 40-45% 3. Normal LVEDP  Plan: compared to prior cath in 2016 there is no significant change. He has 3 vessel diffuse disease without clear focal lesion suitable for PCI. PCI of the PDA would be very challenging due to severe calcification, tortuosity and multiple prior stents in RCA  Echo 12/23/15: Left ventricle: The cavity size was normal. There was mild  concentric hypertrophy. Systolic function was mildly reduced. The  estimated ejection fraction was in the range of 45% to 50%.  Akinesis and scarring of the basal-midinferior myocardium;  consistent with infarction in the distribution of the right  coronary artery. Features are consistent with a pseudonormal left  ventricular filling pattern, with concomitant abnormal relaxation  and increased filling pressure (grade 2 diastolic dysfunction). - Aortic valve: There was mild regurgitation. - Mitral valve: There was mild regurgitation directed centrally. - Left atrium: The atrium was mildly dilated. - Pulmonary arteries: Systolic pressure was mildly increased. PA  peak pressure: 44 mm Hg (S).  EKG:  EKG is not  ordered today. The ekg ordered today demonstrates    Recent Labs: 04/17/2017: B Natriuretic Peptide 393.9 10/15/2017: TSH 0.628 10/19/2017: ALT 41; BUN 12; Creatinine, Ser 1.75; Hemoglobin 10.1; Platelets 194; Potassium 4.9; Sodium 129      Wt Readings from Last 3 Encounters:  11/17/17 182 lb (82.6 kg)  10/19/17 176 lb 9.4 oz (80.1 kg)  10/08/17 194 lb 12.8 oz (88.4 kg)     Other studies Reviewed: Additional studies/ records that were reviewed today include: . Review of the above records demonstrates:    Assessment and Plan:   1. CAD with stable angina: He is known to have diffuse CAD without focal targets for PCI at cath in July 2018.  I saw him in December 2018 and he had severe fatigue. We planned a cardiac cath but his serum sodium came back at 116 so cath cancelled. See below regarding his hyponatremia. Today he is feeling much better. No chest pain, dyspnea or fatigue. He has been restarted on Plavix. Will continue Imdur, statin and beta blocker. No plans for cath at this time.   2. HTN: BP is controlled. No changes.   3. Hyperlipidemia: Continue statin  4. Atrial fibrillation/flutter, paroxysmal: he is in sinus today. Will continue metoprolol and amiodarone. I will start eliquis today.     5. Chronic diastolic CHF: Last echo at [facility] 1/318 with normal LV systolic function. Volume status is normal today. Continue Lasix.   6.  Hyponatremia: Felt to be due to SIADH. Sodium 127 on 11/05/17. This has been managed by Nephrology and IM. He wishes to have a BMET today. We will order this. If sodium is low, will need to follow up with primary care.   Current medicines are reviewed at length with the patient today.  The patient does not have concerns regarding medicines.  The following changes have been made:  no change  Labs/ tests ordered today include:   Orders Placed This Encounter  Procedures  . Basic Metabolic Panel (BMET)    Disposition:   FU with me 3  months  Signed, Lauree Chandler, MD 11/17/2017 4:45 PM    [facility] [place], [place], [place]  [address] Phone: [phone]; Fax: [phone]

## 2017-11-17 NOTE — Patient Instructions (Addendum)
Medication Instructions:  Your physician has recommended you make the following change in your medication: Resume Eliquis 5 mg by mouth twice daily    Labwork: Lab work to be done today--BMP  Testing/Procedures: none  Follow-Up: Your physician recommends that you schedule a follow-up appointment in:3 months. Scheduled for 02/21/18 at 4:20    Any Other Special Instructions Will Be Listed Below (If Applicable).     If you need a refill on your cardiac medications before your next appointment, please call your pharmacy.

## 2017-11-18 LAB — BASIC METABOLIC PANEL
BUN / CREAT RATIO: 8 — AB (ref 10–24)
BUN: 12 mg/dL (ref 8–27)
CHLORIDE: 90 mmol/L — AB (ref 96–106)
CO2: 21 mmol/L (ref 20–29)
Calcium: 9.3 mg/dL (ref 8.6–10.2)
Creatinine, Ser: 1.44 mg/dL — ABNORMAL HIGH (ref 0.76–1.27)
GFR calc non Af Amer: 47 mL/min/{1.73_m2} — ABNORMAL LOW (ref >59)
GFR, EST AFRICAN AMERICAN: 54 mL/min/{1.73_m2} — AB (ref >59)
Glucose: 139 mg/dL — ABNORMAL HIGH (ref 65–99)
POTASSIUM: 4.8 mmol/L (ref 3.5–5.2)
Sodium: 129 mmol/L — ABNORMAL LOW (ref 134–144)

## 2017-11-19 ENCOUNTER — Ambulatory Visit: Payer: BLUE CROSS/BLUE SHIELD | Admitting: Physician Assistant

## 2017-11-19 ENCOUNTER — Other Ambulatory Visit: Payer: Self-pay | Admitting: Cardiovascular Disease

## 2017-11-19 ENCOUNTER — Other Ambulatory Visit: Payer: Self-pay | Admitting: Physician Assistant

## 2017-11-24 ENCOUNTER — Other Ambulatory Visit: Payer: Self-pay | Admitting: Cardiovascular Disease

## 2017-11-24 NOTE — Telephone Encounter (Signed)
Eliquis 5mg  refill request received; pt is 77 yrs old, Wt-82.6kg, Crea-1.44 on 11/17/17, last seen by Dr. Angelena Form on 11/17/17; refill request sent per request.

## 2018-01-03 ENCOUNTER — Other Ambulatory Visit: Payer: Self-pay | Admitting: Cardiovascular Disease

## 2018-01-03 ENCOUNTER — Other Ambulatory Visit: Payer: Self-pay | Admitting: Physician Assistant

## 2018-02-21 ENCOUNTER — Ambulatory Visit (INDEPENDENT_AMBULATORY_CARE_PROVIDER_SITE_OTHER): Payer: BLUE CROSS/BLUE SHIELD | Admitting: Cardiovascular Disease

## 2018-02-21 ENCOUNTER — Encounter: Payer: Self-pay | Admitting: Cardiovascular Disease

## 2018-02-21 VITALS — BP 138/64 | HR 57 | Ht 71.0 in | Wt 191.0 lb

## 2018-02-21 DIAGNOSIS — I10 Essential (primary) hypertension: Secondary | ICD-10-CM | POA: Diagnosis not present

## 2018-02-21 DIAGNOSIS — I480 Paroxysmal atrial fibrillation: Secondary | ICD-10-CM | POA: Diagnosis not present

## 2018-02-21 DIAGNOSIS — E785 Hyperlipidemia, unspecified: Secondary | ICD-10-CM | POA: Diagnosis not present

## 2018-02-21 DIAGNOSIS — I25119 Atherosclerotic heart disease of native coronary artery with unspecified angina pectoris: Secondary | ICD-10-CM | POA: Diagnosis not present

## 2018-02-21 DIAGNOSIS — I5032 Chronic diastolic (congestive) heart failure: Secondary | ICD-10-CM

## 2018-02-21 MED ORDER — CLOPIDOGREL BISULFATE 75 MG PO TABS: ORAL_TABLET | ORAL | 3 refills | Status: DC

## 2018-02-21 MED ORDER — METOPROLOL TARTRATE 25 MG PO TABS: 25.0000 mg | ORAL_TABLET | Freq: Two times a day (BID) | ORAL | 3 refills | Status: DC

## 2018-02-21 MED ORDER — FUROSEMIDE 20 MG PO TABS: 20.0000 mg | ORAL_TABLET | Freq: Every day | ORAL | 3 refills | Status: DC

## 2018-02-21 MED ORDER — LISINOPRIL 20 MG PO TABS: 20.0000 mg | ORAL_TABLET | Freq: Every day | ORAL | 3 refills | Status: DC

## 2018-02-21 MED ORDER — AMIODARONE HCL 200 MG PO TABS: 200.0000 mg | ORAL_TABLET | Freq: Every day | ORAL | 3 refills | Status: DC

## 2018-02-21 NOTE — Patient Instructions (Signed)
Medication Instructions:  Your physician recommends that you continue on your current medications as directed. Please refer to the Current Medication list given to you today.   Labwork: none  Testing/Procedures: none  Follow-Up: Your physician recommends that you schedule a follow-up appointment in: 6 months. Please call our office in 3 months to schedule this appointment   Any Other Special Instructions Will Be Listed Below (If Applicable).     If you need a refill on your cardiac medications before your next appointment, please call your pharmacy.

## 2018-02-21 NOTE — Progress Notes (Signed)
Chief Complaint  Patient presents with  . Follow-up    CAD     History of Present Illness: 77 yo male with history of DM, HTN, hyperlipidemia, atrial fib/flutter, chronic diastolic CHF and CAD here today for cardiac follow up. He has CAD and was admitted for the first time in 2013 with an inferior STEMI and the occluded RCA was treated with a drug eluting stent. He was readmitted in 2014 with a NSTEMI and found to have a severe stenosis in the OM branch treated with a DES. First episode of atrial flutter in 2014. Echo in 2014 with normal LV size and function.  Repeat cardiac cath in setting of chest pain in February 2015 and found to have high grade stenosis in the distal RCA treated with a drug eluting stent. He had several admissions in 2015 with atrial fib with RVR. He developed a rash on Cardizem. He has been managed with amiodarone and metoprolol and anti-coagulated with Eliquis. Repeat cardiac cath in January 2016 with stable CAD. Stress myoview 2017 with no ischemia. Echo February 2017 with LVEF=45-50%, mild AI. Mild MR. He was seen in the ED June 2018 with chest pain and troponin was negative. He was in sinus brady. Admission was recommended but he left the ED against medical advice. His chest pain resolved on Imdur. Metoprolol reduced to 25 mg po BID due to bradycardia and dizziness in June 2018. I saw him in July 2018 and he c/o chest pain. Cardiac cath July 2018 and CAD was stable. He has 3 vessel disease with moderate stenosis throughout all three vessels. I saw him in the office 10/08/17 and he had c/o dyspnea and fatigue. I arranged a repeat cardiac cath but this was cancelled when pre-cath labs showed hyponatremia. He was admitted to [facility] 10/15/17 with N/V and sodium of 113. He was seen by Nephrology and felt to have SIADH. Lisinopril and Lasix were stopped. He was placed on Tolvaptan and sodium was up to 129 before discharge. He was seen by GI and EGD was discussed but was not done. He had  a fall at home after he was tangled in the bed sheets and hit his face hard on the door frame. He was admitted to [facility] 11/02/16 after the fall and was anemic due to severe bleeding from his nose. He had urgent surgical repair of his fractured nasal septum. He was given 3 units of pRBCs. Eliquis and Plavix were held but have been restarted. Echo 11/04/17 at [facility] with normal LV function, LVEF=55-60%. Mild AI, mild to moderate MR. Sodium 127 on day of discharge 11/05/17. I saw him 11/17/17 and he was feeling much better.   He is here today for follow up. The patient denies any chest pain, dyspnea, palpitations, lower extremity edema, orthopnea, PND, dizziness, near syncope or syncope.   Primary Care Physician: Cyndy Freeze, MD  Past Medical History:  Diagnosis Date  . Acute renal insufficiency    a. During 11/2013 adm with atrial flutter.  . Anemia, unspecified   . CAD (coronary artery disease), native coronary artery    a. s/p cath May 2011 >> DES distal RCA;  b. 01/2013 NSTEMI >> OM1 99p (2.75x12 Promus Premier DES);  c. LHC (2/15): PCI: Promus DES to dist RCA ;  d. LHC 1/16 - dLM 77, oLAD 30, small D1 tandem 55, pOM1 stent ok, mOM 50-60, dOM bifurcation 50-60 in both vessels, AV 60-70 jailed by stent, pRCA 40 mRCA stent ok,  dRCA stent ok, PLB smal 50, dPDA 60, 80; EF 35-40 >> med Rx - PCI of PDA if angina  . Chronic combined systolic and diastolic CHF (congestive heart failure) ([place]) 12/23/2015   Echo 2/17: Mild LVH, EF 45-50%, inferior akinesis, grade 2 diastolic dysfunction, mild AI, mild MR, mild LAE, PASP 44 mmHg   . Colorectal polyps 2002  . Diabetes mellitus    AODM  . Erectile dysfunction   . History of echocardiogram    a. 01/2013 Echo: EF 55%, mid-dist inflat HK, Gr1 DD, Triv AI/TR, Mild MR.;  b.  Echo (1/15): Mild LVH, EF 55%, inferolateral hypokinesis, grade 1 diastolic dysfunction, mild AI, trivial MR, moderate LAE, PASP 36 mmHg   . Hx of cardiovascular stress test     Stress myoview 08/07/13 with LVEF 44%, inferior scar, no ischemia  . Hyperlipidemia   . Hypertension   . Ischemic cardiomyopathy    a. EF 35-40% by LHC in 1/16; b. Echo 2/17: Mild LVH, EF 45-50%, inferior akinesis, grade 2 diastolic dysfunction, mild AI, mild MR, mild LAE, PASP 44 mmHg  . Myocardial infarction ([place])   . PAF (paroxysmal atrial fibrillation) ([place])    a. Dx 11/2013. Spont conv. Placed on eliquis.    Past Surgical History:  Procedure Laterality Date  . COLONOSCOPY W/ POLYPECTOMY  2002   [facility]  . COLONOSCOPY W/ POLYPECTOMY  2004; 2007 negative  . CORONARY ANGIOPLASTY  12/26/2013  . CORONARY STENT PLACEMENT  2007, 2011  . infantile paralysis facial asymmetry    . LEFT HEART CATH AND CORONARY ANGIOGRAPHY N/A 05/18/2017   Procedure: Left Heart Cath and Coronary Angiography;  Surgeon: Martinique, Peter M, MD;  Location: [facility];  Service: Cardiovascular;  Laterality: N/A;  . LEFT HEART CATHETERIZATION WITH CORONARY ANGIOGRAM N/A 10/02/2012   Procedure: LEFT HEART CATHETERIZATION WITH CORONARY ANGIOGRAM;  Surgeon: Peter M Martinique, MD;  Location: [facility] CATH LAB;  Service: Cardiovascular;  Laterality: N/A;  . LEFT HEART CATHETERIZATION WITH CORONARY ANGIOGRAM N/A 02/20/2013   Procedure: LEFT HEART CATHETERIZATION WITH CORONARY ANGIOGRAM;  Surgeon: Burnell Blanks, MD;  Location: [facility] CATH LAB;  Service: Cardiovascular;  Laterality: N/A;  . LEFT HEART CATHETERIZATION WITH CORONARY ANGIOGRAM N/A 12/26/2013   Procedure: LEFT HEART CATHETERIZATION WITH CORONARY ANGIOGRAM;  Surgeon: Burnell Blanks, MD;  Location: [facility] CATH LAB;  Service: Cardiovascular;  Laterality: N/A;  . LEFT HEART CATHETERIZATION WITH CORONARY ANGIOGRAM N/A 11/28/2014   Procedure: LEFT HEART CATHETERIZATION WITH CORONARY ANGIOGRAM;  Surgeon: Burnell Blanks, MD;  Location: [facility] CATH LAB;  Service: Cardiovascular;  Laterality: N/A;  . PERCUTANEOUS CORONARY STENT INTERVENTION (PCI-S)  10/02/2012    Procedure: PERCUTANEOUS CORONARY STENT INTERVENTION (PCI-S);  Surgeon: Peter M Martinique, MD;  Location: [facility] CATH LAB;  Service: Cardiovascular;;  . PERCUTANEOUS CORONARY STENT INTERVENTION (PCI-S)  02/20/2013   Procedure: PERCUTANEOUS CORONARY STENT INTERVENTION (PCI-S);  Surgeon: Burnell Blanks, MD;  Location: [facility] CATH LAB;  Service: Cardiovascular;;  . PERCUTANEOUS CORONARY STENT INTERVENTION (PCI-S)  12/26/2013   Procedure: PERCUTANEOUS CORONARY STENT INTERVENTION (PCI-S);  Surgeon: Burnell Blanks, MD;  Location: [facility] CATH LAB;  Service: Cardiovascular;;  . WISDOM TOOTH EXTRACTION      Current Outpatient Medications  Medication Sig Dispense Refill  . amiodarone (PACERONE) 200 MG tablet Take 1 tablet (200 mg total) by mouth daily. 90 tablet 3  . amLODipine (NORVASC) 5 MG tablet TAKE 1 TABLET EVERY DAY 90 tablet 3  . apixaban (ELIQUIS) 5 MG TABS tablet Take 1  tablet (5 mg total) by mouth 2 (two) times daily. 180 tablet 1  . atorvastatin (LIPITOR) 20 MG tablet Take 1 tablet (20 mg total) by mouth daily. 90 tablet 3  . Chelated Potassium 99 MG TABS Take 99 mg by mouth daily as needed (supplementation).    . cholecalciferol (VITAMIN D) 1000 UNITS tablet Take 1,000 Units by mouth every evening.     . clopidogrel (PLAVIX) 75 MG tablet TAKE 1 TABLET EVERY DAY WITH BREAKFAST 90 tablet 3  . ELIQUIS 5 MG TABS tablet TAKE 1 TABLET BY MOUTH TWICE DAILY 180 tablet 3  . fexofenadine (ALLEGRA) 180 MG tablet Take 180 mg by mouth daily as needed for allergies.     . furosemide (LASIX) 20 MG tablet Take 1 tablet (20 mg total) by mouth daily. 90 tablet 3  . isosorbide mononitrate (IMDUR) 60 MG 24 hr tablet Take 1 tablet (60 mg total) by mouth daily. 90 tablet 3  . lisinopril (PRINIVIL,ZESTRIL) 20 MG tablet Take 1 tablet (20 mg total) by mouth daily. 90 tablet 3  . metoprolol tartrate (LOPRESSOR) 25 MG tablet Take 1 tablet (25 mg total) by mouth 2 (two) times daily. 180 tablet 3  . Multiple Vitamin  (MULTIVITAMIN) tablet Take 1 tablet by mouth at bedtime.     . nitroGLYCERIN (NITROSTAT) 0.4 MG SL tablet Place 0.4 mg under the tongue every 5 (five) minutes as needed for chest pain.    [name] Kitchen ondansetron (ZOFRAN) 8 MG tablet Take 8 mg by mouth every 8 (eight) hours as needed for nausea or vomiting.    . vitamin E 100 UNIT capsule Take 100 Units by mouth at bedtime.      No current facility-administered medications for this visit.     Allergies  Allergen Reactions  . Brilinta [Ticagrelor] Other (See Comments)    Arthralgias & myalgias  . Crestor [Rosuvastatin] Other (See Comments)    Myalgias    Social History   Socioeconomic History  . Marital status: Married    Spouse name: Not on file  . Number of children: 1  . Years of education: Not on file  . Highest education level: Not on file  Occupational History  . Occupation: maintenance    Comment: full time  Social Needs  . Financial resource strain: Not on file  . Food insecurity:    Worry: Not on file    Inability: Not on file  . Transportation needs:    Medical: Not on file    Non-medical: Not on file  Tobacco Use  . Smoking status: Former Smoker    Last attempt to quit: 11/02/1984    Years since quitting: 33.3  . Smokeless tobacco: Never Used  . Tobacco comment: smoked [place], up to 3 ppd (!)  Substance and Sexual Activity  . Alcohol use: No  . Drug use: No  . Sexual activity: Not on file  Lifestyle  . Physical activity:    Days per week: Not on file    Minutes per session: Not on file  . Stress: Not on file  Relationships  . Social connections:    Talks on phone: Not on file    Gets together: Not on file    Attends religious service: Not on file    Active member of club or organization: Not on file    Attends meetings of clubs or organizations: Not on file    Relationship status: Not on file  . Intimate partner violence:    Fear  of current or ex partner: Not on file    Emotionally abused: Not on file     Physically abused: Not on file    Forced sexual activity: Not on file  Other Topics Concern  . Not on file  Social History Narrative   [employer] - does maintenance    Family History  Problem Relation Age of Onset  . Heart attack Brother 50  . Colon cancer Brother   . Diabetes Neg Hx   . Stroke Neg Hx   . Hypertension Neg Hx     Review of Systems:  As stated in the HPI and otherwise negative.   BP 138/64   Pulse (!) 57   Ht 5\' 11"  (1.803 m)   Wt 191 lb (86.6 kg)   SpO2 97%   BMI 26.64 kg/m   Physical Examination: General: Well developed, well nourished, NAD  HEENT: OP clear, mucus membranes moist  SKIN: warm, dry. No rashes. Neuro: No focal deficits  Musculoskeletal: Muscle strength 5/5 all ext  Psychiatric: Mood and affect normal  Neck: No JVD, no carotid bruits, no thyromegaly, no lymphadenopathy.  Lungs:Clear bilaterally, no wheezes, rhonci, crackles Cardiovascular: Regular rate and rhythm. No murmurs, gallops or rubs. Abdomen:Soft. Bowel sounds present. Non-tender.  Extremities: No lower extremity edema. Pulses are 2 + in the bilateral DP/PT.  Cardiac cath 05/18/17:  1. 3 vessel obstructive CAD    - 40% distal left main    - 50% ostial LAD. The LAD is relatively small.    - diffuse 70% mid first diagonal. This is a large branch    - 75% mid OM 1. Stent in the proximal vessel is still patent    - 80% distal LCx after OM1    - 60% proximal RCA, stents in the mid and distal RCA are patent    - 90% PDA 2. Moderate LV dysfunction with inferior HK. EF 40-45% 3. Normal LVEDP  Plan: compared to prior cath in 2016 there is no significant change. He has 3 vessel diffuse disease without clear focal lesion suitable for PCI. PCI of the PDA would be very challenging due to severe calcification, tortuosity and multiple prior stents in RCA  Echo  scanned January 2019 from [facility]   EKG:  EKG is not ordered today. The ekg ordered today demonstrates     Recent Labs: 04/17/2017: B Natriuretic Peptide 393.9 10/15/2017: TSH 0.628 10/19/2017: ALT 41; Hemoglobin 10.1; Platelets 194 11/17/2017: BUN 12; Creatinine, Ser 1.44; Potassium 4.8; Sodium 129      Wt Readings from Last 3 Encounters:  02/21/18 191 lb (86.6 kg)  11/17/17 182 lb (82.6 kg)  10/19/17 176 lb 9.4 oz (80.1 kg)     Other studies Reviewed: Additional studies/ records that were reviewed today include: . Review of the above records demonstrates:    Assessment and Plan:   1. CAD with stable angina: He is known to have diffuse moderate CAD without focal targets for PCI at cath in July 2018. No recent chest pain since his hyponatremia was corrected. I will continue medical management of his CAD with Plavix, Imdur, statin and beta blocker.    2. HTN: BP controlled. No changes.   3. Hyperlipidemia: Continue statin.   4. Atrial fibrillation/flutter, paroxysmal: Sinus today. Will continue metoprolol and amiodarone. He is up to date on eye exams, chest x-ray, TSH, LFTs. Continue Eliquis.      5. Chronic diastolic CHF: Last echo at [facility] 1/318 with normal LV  systolic function. His volume status is ok today. No changes. Continue low dose daily Lasix.   6. Hyponatremia: Felt to be due to SIADH. Managed in primary care.    Current medicines are reviewed at length with the patient today.  The patient does not have concerns regarding medicines.  The following changes have been made:  no change  Labs/ tests ordered today include:   No orders of the defined types were placed in this encounter.   Disposition:   FU with me 6 months  Signed, Lauree Chandler, MD 02/21/2018 4:50 PM    [facility] [place], [place], [place]  [address] Phone: [phone]; Fax: [phone]

## 2018-07-19 DIAGNOSIS — R531 Weakness: Secondary | ICD-10-CM | POA: Diagnosis not present

## 2018-07-20 DIAGNOSIS — I5030 Unspecified diastolic (congestive) heart failure: Secondary | ICD-10-CM | POA: Diagnosis not present

## 2018-07-20 DIAGNOSIS — E222 Syndrome of inappropriate secretion of antidiuretic hormone: Secondary | ICD-10-CM | POA: Diagnosis not present

## 2018-07-20 DIAGNOSIS — E2749 Other adrenocortical insufficiency: Secondary | ICD-10-CM | POA: Diagnosis not present

## 2018-07-20 DIAGNOSIS — E871 Hypo-osmolality and hyponatremia: Secondary | ICD-10-CM | POA: Diagnosis not present

## 2018-07-21 DIAGNOSIS — I5030 Unspecified diastolic (congestive) heart failure: Secondary | ICD-10-CM | POA: Diagnosis not present

## 2018-07-21 DIAGNOSIS — E2749 Other adrenocortical insufficiency: Secondary | ICD-10-CM | POA: Diagnosis not present

## 2018-07-21 DIAGNOSIS — E222 Syndrome of inappropriate secretion of antidiuretic hormone: Secondary | ICD-10-CM | POA: Diagnosis not present

## 2018-07-21 DIAGNOSIS — E871 Hypo-osmolality and hyponatremia: Secondary | ICD-10-CM | POA: Diagnosis not present

## 2018-07-22 DIAGNOSIS — I5030 Unspecified diastolic (congestive) heart failure: Secondary | ICD-10-CM | POA: Diagnosis not present

## 2018-07-22 DIAGNOSIS — E871 Hypo-osmolality and hyponatremia: Secondary | ICD-10-CM | POA: Diagnosis not present

## 2018-07-22 DIAGNOSIS — E222 Syndrome of inappropriate secretion of antidiuretic hormone: Secondary | ICD-10-CM | POA: Diagnosis not present

## 2018-07-22 DIAGNOSIS — E2749 Other adrenocortical insufficiency: Secondary | ICD-10-CM | POA: Diagnosis not present

## 2018-07-24 ENCOUNTER — Encounter (HOSPITAL_COMMUNITY): Payer: Self-pay | Admitting: Emergency Medicine

## 2018-07-24 ENCOUNTER — Emergency Department (HOSPITAL_COMMUNITY): Payer: BLUE CROSS/BLUE SHIELD

## 2018-07-24 ENCOUNTER — Emergency Department (HOSPITAL_COMMUNITY)
Admission: EM | Admit: 2018-07-24 | Discharge: 2018-07-24 | Disposition: A | Discharge status: Home / Self Care | Payer: BLUE CROSS/BLUE SHIELD | Attending: Emergency Medicine | Admitting: Emergency Medicine

## 2018-07-24 DIAGNOSIS — I110 Hypertensive heart disease with heart failure: Secondary | ICD-10-CM | POA: Diagnosis not present

## 2018-07-24 DIAGNOSIS — Z955 Presence of coronary angioplasty implant and graft: Secondary | ICD-10-CM | POA: Diagnosis not present

## 2018-07-24 DIAGNOSIS — Z87891 Personal history of nicotine dependence: Secondary | ICD-10-CM | POA: Diagnosis not present

## 2018-07-24 DIAGNOSIS — R0789 Other chest pain: Secondary | ICD-10-CM | POA: Diagnosis not present

## 2018-07-24 DIAGNOSIS — Z7902 Long term (current) use of antithrombotics/antiplatelets: Secondary | ICD-10-CM | POA: Insufficient documentation

## 2018-07-24 DIAGNOSIS — I200 Unstable angina: Secondary | ICD-10-CM | POA: Insufficient documentation

## 2018-07-24 DIAGNOSIS — I5042 Chronic combined systolic (congestive) and diastolic (congestive) heart failure: Secondary | ICD-10-CM | POA: Diagnosis not present

## 2018-07-24 DIAGNOSIS — R079 Chest pain, unspecified: Secondary | ICD-10-CM | POA: Diagnosis not present

## 2018-07-24 DIAGNOSIS — I4891 Unspecified atrial fibrillation: Secondary | ICD-10-CM | POA: Diagnosis not present

## 2018-07-24 DIAGNOSIS — I499 Cardiac arrhythmia, unspecified: Secondary | ICD-10-CM | POA: Diagnosis not present

## 2018-07-24 DIAGNOSIS — Z79899 Other long term (current) drug therapy: Secondary | ICD-10-CM | POA: Diagnosis not present

## 2018-07-24 DIAGNOSIS — E1151 Type 2 diabetes mellitus with diabetic peripheral angiopathy without gangrene: Secondary | ICD-10-CM | POA: Insufficient documentation

## 2018-07-24 DIAGNOSIS — R000 Tachycardia, unspecified: Secondary | ICD-10-CM | POA: Diagnosis not present

## 2018-07-24 LAB — BASIC METABOLIC PANEL
Anion gap: 9 (ref 5–15)
BUN: 18 mg/dL (ref 8–23)
CO2: 22 mmol/L (ref 22–32)
CREATININE: 1.61 mg/dL — AB (ref 0.61–1.24)
Calcium: 9 mg/dL (ref 8.9–10.3)
Chloride: 102 mmol/L (ref 98–111)
GFR, EST AFRICAN AMERICAN: 46 mL/min — AB (ref >60)
GFR, EST NON AFRICAN AMERICAN: 40 mL/min — AB (ref >60)
GLUCOSE: 200 mg/dL — AB (ref 70–99)
Potassium: 4.3 mmol/L (ref 3.5–5.1)
Sodium: 133 mmol/L — ABNORMAL LOW (ref 135–145)

## 2018-07-24 LAB — CBC
HCT: 32.1 % — ABNORMAL LOW (ref 39.0–52.0)
HEMOGLOBIN: 10.4 g/dL — AB (ref 13.0–17.0)
MCH: 30.1 pg (ref 26.0–34.0)
MCHC: 32.4 g/dL (ref 30.0–36.0)
MCV: 93 fL (ref 78.0–100.0)
PLATELETS: 183 10*3/uL (ref 150–400)
RBC: 3.45 MIL/uL — ABNORMAL LOW (ref 4.22–5.81)
RDW: 13.2 % (ref 11.5–15.5)
WBC: 10.1 10*3/uL (ref 4.0–10.5)

## 2018-07-24 LAB — I-STAT TROPONIN, ED: Troponin i, poc: 0 ng/mL (ref 0.00–0.08)

## 2018-07-24 LAB — BRAIN NATRIURETIC PEPTIDE: B NATRIURETIC PEPTIDE 5: 121.9 pg/mL — AB (ref 0.0–100.0)

## 2018-07-24 MED ORDER — BENZONATATE 100 MG PO CAPS
100.0000 mg | ORAL_CAPSULE | Freq: Once | ORAL | Status: AC
  Administered 2018-07-24: 100 mg via ORAL
  Filled 2018-07-24: qty 1

## 2018-07-24 NOTE — ED Notes (Signed)
Diet Coke provided to wife via Environmental education officer, Therapist, sports

## 2018-07-24 NOTE — ED Notes (Signed)
Patient transported to x-ray. ?

## 2018-07-24 NOTE — ED Notes (Signed)
Patient able to ambulate independently  

## 2018-07-24 NOTE — Discharge Instructions (Signed)
Please call and follow up closely with your cardiologist for further management of your chest pain.  Return promptly if your condition worsen or if you have other concerns.

## 2018-07-24 NOTE — ED Provider Notes (Signed)
[place] EMERGENCY DEPARTMENT Provider Note   CSN: [id number] Arrival date & time: 07/24/18  1103     History   Chief Complaint Chief Complaint  Patient presents with  . Chest Pain    HPI Gregory Bates is a 77 y.o. male.  The history is provided by the patient, the spouse and medical records. No language interpreter was used.  Chest Pain      77 year old male with significant history of CAD, diabetes, CHF, atrial flutter, cardiac stenting currently on Eliquis brought here via EMS from home for evaluation of chest pain.  Patient report this morning while he was at [employer] eating he developed pain in his chest.  He described pain as a pressure sensation to his left chest with associated shortness of breath.  Pain initially was intense, he took his aspirin and sublingual nitro and called EMS.  When EMS arrived his pain did improve, he received additional nitro and now his pain is since resolved.  He denies any associated lightheadedness, dizziness, nausea, diaphoresis with history of chest pain.  It does reminds him of prior MI that he had in the past.  Patient also mention recently discharged from the hospital at [facility] due to generalized weakness and was found to be hyponatremic with an initial sodium of 117.  He was kept for 3 days and subsequently discharged several days prior.  He does endorse cough productive with sputum since been discharge.  His cardiologist is Dr. Angelena Form.  Past Medical History:  Diagnosis Date  . Acute renal insufficiency    a. During 11/2013 adm with atrial flutter.  . Anemia, unspecified   . CAD (coronary artery disease), native coronary artery    a. s/p cath May 2011 >> DES distal RCA;  b. 01/2013 NSTEMI >> OM1 99p (2.75x12 Promus Premier DES);  c. LHC (2/15): PCI: Promus DES to dist RCA ;  d. LHC 1/16 - dLM 69, oLAD 30, small D1 tandem 52, pOM1 stent ok, mOM 50-60, dOM bifurcation 50-60 in both vessels, AV 27-70 jailed by stent, pRCA  65 mRCA stent ok, dRCA stent ok, PLB smal 50, dPDA 60, 80; EF 35-40 >> med Rx - PCI of PDA if angina  . Chronic combined systolic and diastolic CHF (congestive heart failure) ([place]) 12/23/2015   Echo 2/17: Mild LVH, EF 45-50%, inferior akinesis, grade 2 diastolic dysfunction, mild AI, mild MR, mild LAE, PASP 44 mmHg   . Colorectal polyps 2002  . Diabetes mellitus    AODM  . Erectile dysfunction   . History of echocardiogram    a. 01/2013 Echo: EF 55%, mid-dist inflat HK, Gr1 DD, Triv AI/TR, Mild MR.;  b.  Echo (1/15): Mild LVH, EF 55%, inferolateral hypokinesis, grade 1 diastolic dysfunction, mild AI, trivial MR, moderate LAE, PASP 36 mmHg   . Hx of cardiovascular stress test    Stress myoview 08/07/13 with LVEF 44%, inferior scar, no ischemia  . Hyperlipidemia   . Hypertension   . Ischemic cardiomyopathy    a. EF 35-40% by LHC in 1/16; b. Echo 2/17: Mild LVH, EF 45-50%, inferior akinesis, grade 2 diastolic dysfunction, mild AI, mild MR, mild LAE, PASP 44 mmHg  . Myocardial infarction ([place])   . PAF (paroxysmal atrial fibrillation) ([place])    a. Dx 11/2013. Spont conv. Placed on eliquis.    Patient Active Problem List   Diagnosis Date Noted  . Hyponatremia 10/15/2017  . Nausea & vomiting 10/15/2017  . Chronic combined systolic and diastolic  CHF (congestive heart failure) ([place]) 12/23/2015  . Exertional angina (HCC) 11/21/2014  . Unstable angina ([place]) 12/26/2013  . Atrial flutter ([place]) 11/03/2013  . NSTEMI (non-ST elevated myocardial infarction) ([place]) 02/17/2013  . ST elevation myocardial infarction (STEMI) of inferior wall, initial episode of care ([place]) 10/02/2012  . CAD (coronary artery disease) 10/02/2012  . Type 2 diabetes mellitus with vascular disease ([place]) 09/18/2010  . Essential hypertension 03/28/2010  . COLONIC POLYPS, RECURRENT 04/26/2008  . HYPERLIPIDEMIA 10/07/2007  . ERECTILE DYSFUNCTION 10/07/2007    Past Surgical History:  Procedure Laterality Date  . COLONOSCOPY W/  POLYPECTOMY  2002   [facility]  . COLONOSCOPY W/ POLYPECTOMY  2004; 2007 negative  . CORONARY ANGIOPLASTY  12/26/2013  . CORONARY STENT PLACEMENT  2007, 2011  . infantile paralysis facial asymmetry    . LEFT HEART CATH AND CORONARY ANGIOGRAPHY N/A 05/18/2017   Procedure: Left Heart Cath and Coronary Angiography;  Surgeon: Martinique, Peter M, MD;  Location: [place] [facility] CV LAB;  Service: Cardiovascular;  Laterality: N/A;  . LEFT HEART CATHETERIZATION WITH CORONARY ANGIOGRAM N/A 10/02/2012   Procedure: LEFT HEART CATHETERIZATION WITH CORONARY ANGIOGRAM;  Surgeon: Peter M Martinique, MD;  Location: [facility] CATH LAB;  Service: Cardiovascular;  Laterality: N/A;  . LEFT HEART CATHETERIZATION WITH CORONARY ANGIOGRAM N/A 02/20/2013   Procedure: LEFT HEART CATHETERIZATION WITH CORONARY ANGIOGRAM;  Surgeon: Burnell Blanks, MD;  Location: [facility] CATH LAB;  Service: Cardiovascular;  Laterality: N/A;  . LEFT HEART CATHETERIZATION WITH CORONARY ANGIOGRAM N/A 12/26/2013   Procedure: LEFT HEART CATHETERIZATION WITH CORONARY ANGIOGRAM;  Surgeon: Burnell Blanks, MD;  Location: [facility] CATH LAB;  Service: Cardiovascular;  Laterality: N/A;  . LEFT HEART CATHETERIZATION WITH CORONARY ANGIOGRAM N/A 11/28/2014   Procedure: LEFT HEART CATHETERIZATION WITH CORONARY ANGIOGRAM;  Surgeon: Burnell Blanks, MD;  Location: [facility] CATH LAB;  Service: Cardiovascular;  Laterality: N/A;  . PERCUTANEOUS CORONARY STENT INTERVENTION (PCI-S)  10/02/2012   Procedure: PERCUTANEOUS CORONARY STENT INTERVENTION (PCI-S);  Surgeon: Peter M Martinique, MD;  Location: [facility] CATH LAB;  Service: Cardiovascular;;  . PERCUTANEOUS CORONARY STENT INTERVENTION (PCI-S)  02/20/2013   Procedure: PERCUTANEOUS CORONARY STENT INTERVENTION (PCI-S);  Surgeon: Burnell Blanks, MD;  Location: [facility] CATH LAB;  Service: Cardiovascular;;  . PERCUTANEOUS CORONARY STENT INTERVENTION (PCI-S)  12/26/2013   Procedure: PERCUTANEOUS CORONARY STENT INTERVENTION (PCI-S);  Surgeon:  Burnell Blanks, MD;  Location: [facility] CATH LAB;  Service: Cardiovascular;;  . WISDOM TOOTH EXTRACTION          Home Medications    Prior to Admission medications   Medication Sig Start Date End Date Taking? Authorizing Provider  amiodarone (PACERONE) 200 MG tablet Take 1 tablet (200 mg total) by mouth daily. 02/21/18   Burnell Blanks, MD  amLODipine (NORVASC) 5 MG tablet TAKE 1 TABLET EVERY DAY 08/20/17   Richardson Dopp T, PA-C  apixaban (ELIQUIS) 5 MG TABS tablet Take 1 tablet (5 mg total) by mouth 2 (two) times daily. 11/17/17   Burnell Blanks, MD  atorvastatin (LIPITOR) 20 MG tablet Take 1 tablet (20 mg total) by mouth daily. 11/19/17   Burnell Blanks, MD  Chelated Potassium 99 MG TABS Take 99 mg by mouth daily as needed (supplementation).    [provider]  cholecalciferol (VITAMIN D) 1000 UNITS tablet Take 1,000 Units by mouth every evening.     [provider]  clopidogrel (PLAVIX) 75 MG tablet TAKE 1 TABLET EVERY DAY WITH BREAKFAST 02/21/18   Burnell Blanks, MD  ELIQUIS 5 MG TABS tablet TAKE 1 TABLET BY MOUTH TWICE DAILY 11/24/17   Burnell Blanks, MD  fexofenadine (ALLEGRA) 180 MG tablet Take 180 mg by mouth daily as needed for allergies.     [provider]  furosemide (LASIX) 20 MG tablet Take 1 tablet (20 mg total) by mouth daily. 02/21/18   Burnell Blanks, MD  isosorbide mononitrate (IMDUR) 60 MG 24 hr tablet Take 1 tablet (60 mg total) by mouth daily. 11/19/17   Burnell Blanks, MD  lisinopril (PRINIVIL,ZESTRIL) 20 MG tablet Take 1 tablet (20 mg total) by mouth daily. 02/21/18   Burnell Blanks, MD  metoprolol tartrate (LOPRESSOR) 25 MG tablet Take 1 tablet (25 mg total) by mouth 2 (two) times daily. 02/21/18   Burnell Blanks, MD  Multiple Vitamin (MULTIVITAMIN) tablet Take 1 tablet by mouth at bedtime.     [provider]  nitroGLYCERIN (NITROSTAT) 0.4 MG SL tablet Place  0.4 mg under the tongue every 5 (five) minutes as needed for chest pain.    [provider]  ondansetron (ZOFRAN) 8 MG tablet Take 8 mg by mouth every 8 (eight) hours as needed for nausea or vomiting.    [provider]  vitamin E 100 UNIT capsule Take 100 Units by mouth at bedtime.     [provider]    Family History Family History  Problem Relation Age of Onset  . Heart attack Brother 53  . Colon cancer Brother   . Diabetes Neg Hx   . Stroke Neg Hx   . Hypertension Neg Hx     Social History Social History   Tobacco Use  . Smoking status: Former Smoker    Last attempt to quit: 11/02/1984    Years since quitting: 33.7  . Smokeless tobacco: Never Used  . Tobacco comment: smoked [place], up to 3 ppd (!)  Substance Use Topics  . Alcohol use: No  . Drug use: No     Allergies   Brilinta [ticagrelor] and Crestor [rosuvastatin]   Review of Systems Review of Systems  Cardiovascular: Positive for chest pain.  All other systems reviewed and are negative.    Physical Exam Updated Vital Signs BP 124/68 (BP Location: Left Arm)   Pulse (!) 113   Temp 97.9 F (36.6 C) (Oral)   Resp 11   SpO2 100%   Physical Exam  Constitutional: He is oriented to person, place, and time. He appears well-developed and well-nourished. No distress.  HENT:  Head: Atraumatic.  Eyes: Conjunctivae are normal.  Neck: Neck supple.  Cardiovascular:  Tachycardia without murmur rubs or gallops  Pulmonary/Chest: Effort normal and breath sounds normal. He has no decreased breath sounds.  Abdominal: Soft. He exhibits no mass. There is no tenderness.  Musculoskeletal:       Right lower leg: He exhibits no edema.       Left lower leg: He exhibits no edema.  Neurological: He is alert and oriented to person, place, and time.  Skin: No rash noted.  Psychiatric: He has a normal mood and affect.  Nursing note and vitals reviewed.    ED Treatments / Results  Labs (all  labs ordered are listed, but only abnormal results are displayed) Labs Reviewed  BASIC METABOLIC PANEL - Abnormal; Notable for the following components:      Result Value   Sodium 133 (*)    Glucose, Bld 200 (*)    Creatinine, Ser 1.61 (*)  GFR calc non Af Amer 40 (*)    GFR calc Af Amer 46 (*)    All other components within normal limits  CBC - Abnormal; Notable for the following components:   RBC 3.45 (*)    Hemoglobin 10.4 (*)    HCT 32.1 (*)    All other components within normal limits  BRAIN NATRIURETIC PEPTIDE - Abnormal; Notable for the following components:   B Natriuretic Peptide 121.9 (*)    All other components within normal limits  I-STAT TROPONIN, ED    EKG EKG Interpretation  Date/Time:  Sunday July 24 2018 11:09:34 EDT Ventricular Rate:  113 PR Interval:    QRS Duration: 104 QT Interval:  307 QTC Calculation: 421 R Axis:   61 Text Interpretation:  Sinus or ectopic atrial tachycardia nonspecific ST changes Confirmed by Pickering, Nathan ([id number]) on 07/24/2018 1:20:12 PM   ED ECG REPORT   Date: 07/24/2018  Rate: 113  Rhythm: sinus tachycardia  QRS Axis: normal  Intervals: normal  ST/T Wave abnormalities: nonspecific ST changes  Conduction Disutrbances:none  Narrative Interpretation:   Old EKG Reviewed: changes noted  I have personally reviewed the EKG tracing and agree with the computerized printout as noted.   Radiology Dg Chest 2 View  Result Date: 07/24/2018 CLINICAL DATA:  Pt reports cough x yesterday and left sided chest pain for over 1 hour; Hx of MI w/ 5 stents, DM, and HTN; former smoker EXAM: CHEST - 2 VIEW COMPARISON:  07/19/2018 FINDINGS: Cardiac silhouette is normal in size and configuration no mediastinal or hilar masses. No evidence of adenopathy. Clear lungs.  No pleural effusion or pneumothorax. Skeletal structures are demineralized but intact. IMPRESSION: No active cardiopulmonary disease. Electronically Signed   By: David  Ormond  M.D.   On: 07/24/2018 12:17    Procedures Procedures (including critical care time)  Medications Ordered in ED Medications  benzonatate (TESSALON) capsule 100 mg (100 mg Oral Given 07/24/18 1334)     Initial Impression / Assessment and Plan / ED Course  I have reviewed the triage vital signs and the nursing notes.  Pertinent labs & imaging results that were available during my care of the patient were reviewed by me and considered in my medical decision making (see chart for details).     BP 132/72   Pulse (!) 112   Temp 97.9 F (36.6 C) (Oral)   Resp (!) 24   SpO2 98%    Final Clinical Impressions(s) / ED Diagnoses   Final diagnoses:  Unstable angina (HCC)    ED Discharge Orders    None     11 :42 AM Patient with significant cardiac history he with left-sided chest pressure improved with sublingual nitro and aspirin.  Pain is currently resolved.  Symptoms concerning for unstable angina.  Work-up initiated.  1:24 PM Patient is very chest pain-free.  He does endorse a cough and request for some cough medication.  Otherwise he is requesting to be discharged.  At this time, I strongly encourage patient to stay for cardiac rule out but he declined.  Patient understands that symptoms can worsen without further evaluation and, he will be leaving [place], patient acknowledged.  Care discussed with Dr. Alvino Chapel.  Encourage pt to f/u closely with Dr. Julianne Handler.  Return precaution discussed.    Domenic Moras, PA-C 07/24/18 1404    Davonna Belling, MD 07/24/18 1606

## 2018-07-24 NOTE — ED Triage Notes (Signed)
Pt presents to ED for assessment of 1 hr of left sided chest pain, relieved each time with nitro, and then returning 5-10 minutes later.  Patient has had 325 ASA, 500NaCl, and 3 nitros.  Hx of a-fib, CHF and MI.  Depression noted by EMS in 4, 5, and 6, states nothing remarkable on posterior EKG.  HR 110.

## 2018-07-25 ENCOUNTER — Inpatient Hospital Stay (HOSPITAL_COMMUNITY)
Admission: EM | Admit: 2018-07-25 | Discharge: 2018-08-06 | DRG: 234 | Disposition: A | Discharge status: Home / Self Care | Payer: BLUE CROSS/BLUE SHIELD | Attending: Cardiothoracic Surgery | Admitting: Cardiothoracic Surgery

## 2018-07-25 DIAGNOSIS — R0789 Other chest pain: Secondary | ICD-10-CM | POA: Diagnosis not present

## 2018-07-25 DIAGNOSIS — I5042 Chronic combined systolic (congestive) and diastolic (congestive) heart failure: Secondary | ICD-10-CM | POA: Diagnosis present

## 2018-07-25 DIAGNOSIS — C3490 Malignant neoplasm of unspecified part of unspecified bronchus or lung: Secondary | ICD-10-CM | POA: Diagnosis not present

## 2018-07-25 DIAGNOSIS — I2510 Atherosclerotic heart disease of native coronary artery without angina pectoris: Secondary | ICD-10-CM

## 2018-07-25 DIAGNOSIS — C3411 Malignant neoplasm of upper lobe, right bronchus or lung: Secondary | ICD-10-CM | POA: Diagnosis present

## 2018-07-25 DIAGNOSIS — Z955 Presence of coronary angioplasty implant and graft: Secondary | ICD-10-CM

## 2018-07-25 DIAGNOSIS — R0902 Hypoxemia: Secondary | ICD-10-CM | POA: Diagnosis not present

## 2018-07-25 DIAGNOSIS — Z8639 Personal history of other endocrine, nutritional and metabolic disease: Secondary | ICD-10-CM | POA: Diagnosis not present

## 2018-07-25 DIAGNOSIS — D649 Anemia, unspecified: Secondary | ICD-10-CM | POA: Diagnosis not present

## 2018-07-25 DIAGNOSIS — I080 Rheumatic disorders of both mitral and aortic valves: Secondary | ICD-10-CM | POA: Diagnosis not present

## 2018-07-25 DIAGNOSIS — E222 Syndrome of inappropriate secretion of antidiuretic hormone: Secondary | ICD-10-CM | POA: Diagnosis present

## 2018-07-25 DIAGNOSIS — D62 Acute posthemorrhagic anemia: Secondary | ICD-10-CM | POA: Diagnosis not present

## 2018-07-25 DIAGNOSIS — R079 Chest pain, unspecified: Secondary | ICD-10-CM | POA: Diagnosis not present

## 2018-07-25 DIAGNOSIS — D320 Benign neoplasm of cerebral meninges: Secondary | ICD-10-CM | POA: Diagnosis present

## 2018-07-25 DIAGNOSIS — I130 Hypertensive heart and chronic kidney disease with heart failure and stage 1 through stage 4 chronic kidney disease, or unspecified chronic kidney disease: Secondary | ICD-10-CM | POA: Diagnosis present

## 2018-07-25 DIAGNOSIS — I252 Old myocardial infarction: Secondary | ICD-10-CM | POA: Diagnosis not present

## 2018-07-25 DIAGNOSIS — I25110 Atherosclerotic heart disease of native coronary artery with unstable angina pectoris: Principal | ICD-10-CM | POA: Diagnosis present

## 2018-07-25 DIAGNOSIS — I340 Nonrheumatic mitral (valve) insufficiency: Secondary | ICD-10-CM | POA: Diagnosis present

## 2018-07-25 DIAGNOSIS — Z87891 Personal history of nicotine dependence: Secondary | ICD-10-CM

## 2018-07-25 DIAGNOSIS — I200 Unstable angina: Secondary | ICD-10-CM | POA: Diagnosis present

## 2018-07-25 DIAGNOSIS — I480 Paroxysmal atrial fibrillation: Secondary | ICD-10-CM | POA: Diagnosis present

## 2018-07-25 DIAGNOSIS — Z800 Family history of malignant neoplasm of digestive organs: Secondary | ICD-10-CM

## 2018-07-25 DIAGNOSIS — I5032 Chronic diastolic (congestive) heart failure: Secondary | ICD-10-CM | POA: Diagnosis not present

## 2018-07-25 DIAGNOSIS — Z8249 Family history of ischemic heart disease and other diseases of the circulatory system: Secondary | ICD-10-CM

## 2018-07-25 DIAGNOSIS — J019 Acute sinusitis, unspecified: Secondary | ICD-10-CM | POA: Diagnosis present

## 2018-07-25 DIAGNOSIS — E785 Hyperlipidemia, unspecified: Secondary | ICD-10-CM | POA: Diagnosis present

## 2018-07-25 DIAGNOSIS — N289 Disorder of kidney and ureter, unspecified: Secondary | ICD-10-CM

## 2018-07-25 DIAGNOSIS — N183 Chronic kidney disease, stage 3 unspecified: Secondary | ICD-10-CM | POA: Diagnosis present

## 2018-07-25 DIAGNOSIS — Z01810 Encounter for preprocedural cardiovascular examination: Secondary | ICD-10-CM | POA: Diagnosis not present

## 2018-07-25 DIAGNOSIS — Z7901 Long term (current) use of anticoagulants: Secondary | ICD-10-CM | POA: Diagnosis not present

## 2018-07-25 DIAGNOSIS — E1159 Type 2 diabetes mellitus with other circulatory complications: Secondary | ICD-10-CM | POA: Diagnosis not present

## 2018-07-25 DIAGNOSIS — Z9689 Presence of other specified functional implants: Secondary | ICD-10-CM

## 2018-07-25 DIAGNOSIS — E1122 Type 2 diabetes mellitus with diabetic chronic kidney disease: Secondary | ICD-10-CM | POA: Diagnosis present

## 2018-07-25 DIAGNOSIS — R9389 Abnormal findings on diagnostic imaging of other specified body structures: Secondary | ICD-10-CM | POA: Diagnosis not present

## 2018-07-25 DIAGNOSIS — Z7902 Long term (current) use of antithrombotics/antiplatelets: Secondary | ICD-10-CM | POA: Diagnosis not present

## 2018-07-25 DIAGNOSIS — Z951 Presence of aortocoronary bypass graft: Secondary | ICD-10-CM

## 2018-07-25 DIAGNOSIS — E871 Hypo-osmolality and hyponatremia: Secondary | ICD-10-CM | POA: Diagnosis not present

## 2018-07-25 DIAGNOSIS — R918 Other nonspecific abnormal finding of lung field: Secondary | ICD-10-CM | POA: Diagnosis not present

## 2018-07-25 DIAGNOSIS — R05 Cough: Secondary | ICD-10-CM | POA: Diagnosis not present

## 2018-07-25 DIAGNOSIS — C771 Secondary and unspecified malignant neoplasm of intrathoracic lymph nodes: Secondary | ICD-10-CM | POA: Diagnosis present

## 2018-07-25 DIAGNOSIS — I255 Ischemic cardiomyopathy: Secondary | ICD-10-CM | POA: Diagnosis present

## 2018-07-25 DIAGNOSIS — I10 Essential (primary) hypertension: Secondary | ICD-10-CM | POA: Diagnosis not present

## 2018-07-25 DIAGNOSIS — Z09 Encounter for follow-up examination after completed treatment for conditions other than malignant neoplasm: Secondary | ICD-10-CM

## 2018-07-25 HISTORY — DX: Malignant neoplasm of unspecified part of unspecified bronchus or lung: C34.90

## 2018-07-26 ENCOUNTER — Other Ambulatory Visit: Payer: Self-pay

## 2018-07-26 ENCOUNTER — Encounter (HOSPITAL_COMMUNITY): Payer: Self-pay | Admitting: Emergency Medicine

## 2018-07-26 ENCOUNTER — Emergency Department (HOSPITAL_COMMUNITY): Payer: BLUE CROSS/BLUE SHIELD

## 2018-07-26 DIAGNOSIS — N183 Chronic kidney disease, stage 3 unspecified: Secondary | ICD-10-CM | POA: Diagnosis present

## 2018-07-26 DIAGNOSIS — I5032 Chronic diastolic (congestive) heart failure: Secondary | ICD-10-CM

## 2018-07-26 DIAGNOSIS — D649 Anemia, unspecified: Secondary | ICD-10-CM | POA: Insufficient documentation

## 2018-07-26 DIAGNOSIS — I480 Paroxysmal atrial fibrillation: Secondary | ICD-10-CM | POA: Diagnosis present

## 2018-07-26 DIAGNOSIS — R9389 Abnormal findings on diagnostic imaging of other specified body structures: Secondary | ICD-10-CM

## 2018-07-26 DIAGNOSIS — I2510 Atherosclerotic heart disease of native coronary artery without angina pectoris: Secondary | ICD-10-CM

## 2018-07-26 DIAGNOSIS — E1159 Type 2 diabetes mellitus with other circulatory complications: Secondary | ICD-10-CM

## 2018-07-26 DIAGNOSIS — I200 Unstable angina: Secondary | ICD-10-CM

## 2018-07-26 LAB — GLUCOSE, CAPILLARY
GLUCOSE-CAPILLARY: 118 mg/dL — AB (ref 70–99)
GLUCOSE-CAPILLARY: 92 mg/dL (ref 70–99)
Glucose-Capillary: 121 mg/dL — ABNORMAL HIGH (ref 70–99)
Glucose-Capillary: 128 mg/dL — ABNORMAL HIGH (ref 70–99)
Glucose-Capillary: 149 mg/dL — ABNORMAL HIGH (ref 70–99)

## 2018-07-26 LAB — CBC WITH DIFFERENTIAL/PLATELET
Abs Immature Granulocytes: 0 10*3/uL (ref 0.0–0.1)
Basophils Absolute: 0.1 10*3/uL (ref 0.0–0.1)
Basophils Relative: 1 %
EOS ABS: 0.7 10*3/uL (ref 0.0–0.7)
EOS PCT: 9 %
HCT: 31.2 % — ABNORMAL LOW (ref 39.0–52.0)
HEMOGLOBIN: 10 g/dL — AB (ref 13.0–17.0)
IMMATURE GRANULOCYTES: 0 %
LYMPHS PCT: 14 %
Lymphs Abs: 1 10*3/uL (ref 0.7–4.0)
MCH: 30.1 pg (ref 26.0–34.0)
MCHC: 32.1 g/dL (ref 30.0–36.0)
MCV: 94 fL (ref 78.0–100.0)
MONOS PCT: 12 %
Monocytes Absolute: 0.8 10*3/uL (ref 0.1–1.0)
Neutro Abs: 4.7 10*3/uL (ref 1.7–7.7)
Neutrophils Relative %: 64 %
Platelets: 187 10*3/uL (ref 150–400)
RBC: 3.32 MIL/uL — AB (ref 4.22–5.81)
RDW: 13.2 % (ref 11.5–15.5)
WBC: 7.2 10*3/uL (ref 4.0–10.5)

## 2018-07-26 LAB — BASIC METABOLIC PANEL
Anion gap: 10 (ref 5–15)
Anion gap: 9 (ref 5–15)
BUN: 15 mg/dL (ref 8–23)
BUN: 16 mg/dL (ref 8–23)
CALCIUM: 9.2 mg/dL (ref 8.9–10.3)
CALCIUM: 9.4 mg/dL (ref 8.9–10.3)
CO2: 23 mmol/L (ref 22–32)
CO2: 25 mmol/L (ref 22–32)
CREATININE: 1.54 mg/dL — AB (ref 0.61–1.24)
Chloride: 102 mmol/L (ref 98–111)
Chloride: 99 mmol/L (ref 98–111)
Creatinine, Ser: 1.43 mg/dL — ABNORMAL HIGH (ref 0.61–1.24)
GFR calc Af Amer: 53 mL/min — ABNORMAL LOW (ref >60)
GFR calc non Af Amer: 42 mL/min — ABNORMAL LOW (ref >60)
GFR, EST AFRICAN AMERICAN: 48 mL/min — AB (ref >60)
GFR, EST NON AFRICAN AMERICAN: 46 mL/min — AB (ref >60)
Glucose, Bld: 128 mg/dL — ABNORMAL HIGH (ref 70–99)
Glucose, Bld: 97 mg/dL (ref 70–99)
Potassium: 4.8 mmol/L (ref 3.5–5.1)
Potassium: 4.1 mmol/L (ref 3.5–5.1)
SODIUM: 133 mmol/L — AB (ref 135–145)
SODIUM: 135 mmol/L (ref 135–145)

## 2018-07-26 LAB — HEPARIN LEVEL (UNFRACTIONATED): Heparin Unfractionated: >2.2 IU/mL — ABNORMAL HIGH (ref 0.30–0.70)

## 2018-07-26 LAB — I-STAT TROPONIN, ED: TROPONIN I, POC: 0.03 ng/mL (ref 0.00–0.08)

## 2018-07-26 LAB — PROTIME-INR
INR: 1.31
Prothrombin Time: 16.2 seconds — ABNORMAL HIGH (ref 11.4–15.2)

## 2018-07-26 LAB — TROPONIN I
TROPONIN I: 0.03 ng/mL — AB (ref <0.03)
Troponin I: 0.04 ng/mL (ref <0.03)

## 2018-07-26 LAB — APTT
aPTT: 125 seconds — ABNORMAL HIGH (ref 24–36)
aPTT: 107 seconds — ABNORMAL HIGH (ref 24–36)

## 2018-07-26 MED ORDER — ONDANSETRON HCL 4 MG/2ML IJ SOLN: 4.0000 mg | Freq: Four times a day (QID) | INTRAMUSCULAR | Status: DC | PRN

## 2018-07-26 MED ORDER — ACETAMINOPHEN 325 MG PO TABS: 650.0000 mg | ORAL_TABLET | ORAL | Status: DC | PRN

## 2018-07-26 MED ORDER — NITROGLYCERIN 0.4 MG SL SUBL
0.4000 mg | SUBLINGUAL_TABLET | SUBLINGUAL | Status: DC | PRN
  Administered 2018-07-30 – 2018-07-31 (×8): 0.4 mg via SUBLINGUAL
  Filled 2018-07-26 (×2): qty 1

## 2018-07-26 MED ORDER — AMIODARONE HCL 200 MG PO TABS
200.0000 mg | ORAL_TABLET | Freq: Every day | ORAL | Status: DC
  Administered 2018-07-26 – 2018-07-31 (×6): 200 mg via ORAL
  Filled 2018-07-26 (×6): qty 1

## 2018-07-26 MED ORDER — INSULIN ASPART 100 UNIT/ML ~~LOC~~ SOLN
0.0000 [IU] | SUBCUTANEOUS | Status: DC
  Filled 2018-07-26: qty 1

## 2018-07-26 MED ORDER — CLOPIDOGREL BISULFATE 75 MG PO TABS
75.0000 mg | ORAL_TABLET | Freq: Every day | ORAL | Status: DC
  Administered 2018-07-26: 75 mg via ORAL
  Filled 2018-07-26: qty 1

## 2018-07-26 MED ORDER — SODIUM CHLORIDE 0.9% FLUSH
3.0000 mL | Freq: Two times a day (BID) | INTRAVENOUS | Status: DC
  Administered 2018-07-26: 3 mL via INTRAVENOUS

## 2018-07-26 MED ORDER — ASPIRIN 81 MG PO CHEW
81.0000 mg | CHEWABLE_TABLET | ORAL | Status: AC
  Administered 2018-07-27: 81 mg via ORAL
  Filled 2018-07-26: qty 1

## 2018-07-26 MED ORDER — VITAMIN D 1000 UNITS PO TABS
1000.0000 [IU] | ORAL_TABLET | Freq: Every evening | ORAL | Status: DC
  Administered 2018-07-26 – 2018-07-31 (×5): 1000 [IU] via ORAL
  Filled 2018-07-26 (×5): qty 1

## 2018-07-26 MED ORDER — SODIUM CHLORIDE 0.9 % IV SOLN
INTRAVENOUS | Status: DC
  Administered 2018-07-27: 06:00:00 via INTRAVENOUS

## 2018-07-26 MED ORDER — HEPARIN (PORCINE) IN NACL 100-0.45 UNIT/ML-% IJ SOLN
850.0000 [IU]/h | INTRAMUSCULAR | Status: DC
  Administered 2018-07-26: 1200 [IU]/h via INTRAVENOUS
  Filled 2018-07-26 (×2): qty 250

## 2018-07-26 MED ORDER — METOPROLOL TARTRATE 25 MG PO TABS
25.0000 mg | ORAL_TABLET | Freq: Two times a day (BID) | ORAL | Status: DC
  Administered 2018-07-26 – 2018-07-31 (×12): 25 mg via ORAL
  Filled 2018-07-26 (×12): qty 1

## 2018-07-26 MED ORDER — MORPHINE SULFATE (PF) 2 MG/ML IV SOLN
1.0000 mg | INTRAVENOUS | Status: DC | PRN
  Administered 2018-07-30 – 2018-07-31 (×3): 2 mg via INTRAVENOUS
  Filled 2018-07-26 (×3): qty 1

## 2018-07-26 MED ORDER — FUROSEMIDE 20 MG PO TABS
20.0000 mg | ORAL_TABLET | Freq: Every day | ORAL | Status: DC
  Administered 2018-07-26: 20 mg via ORAL
  Filled 2018-07-26: qty 1

## 2018-07-26 MED ORDER — ASPIRIN 81 MG PO CHEW
324.0000 mg | CHEWABLE_TABLET | Freq: Once | ORAL | Status: AC
  Administered 2018-07-26: 324 mg via ORAL
  Filled 2018-07-26: qty 4

## 2018-07-26 MED ORDER — ADULT MULTIVITAMIN W/MINERALS CH
1.0000 | ORAL_TABLET | Freq: Every day | ORAL | Status: DC
  Administered 2018-07-26 – 2018-07-31 (×6): 1 via ORAL
  Filled 2018-07-26 (×6): qty 1

## 2018-07-26 MED ORDER — SODIUM CHLORIDE 0.9 % IV SOLN: 250.0000 mL | INTRAVENOUS | Status: DC | PRN

## 2018-07-26 MED ORDER — ISOSORBIDE MONONITRATE ER 60 MG PO TB24
60.0000 mg | ORAL_TABLET | Freq: Every day | ORAL | Status: DC
  Administered 2018-07-26 – 2018-07-31 (×6): 60 mg via ORAL
  Filled 2018-07-26 (×6): qty 1

## 2018-07-26 MED ORDER — ATORVASTATIN CALCIUM 20 MG PO TABS
20.0000 mg | ORAL_TABLET | Freq: Every day | ORAL | Status: DC
  Administered 2018-07-26 – 2018-08-05 (×9): 20 mg via ORAL
  Filled 2018-07-26 (×10): qty 1

## 2018-07-26 MED ORDER — LISINOPRIL 20 MG PO TABS
20.0000 mg | ORAL_TABLET | Freq: Every day | ORAL | Status: DC
  Administered 2018-07-26: 20 mg via ORAL
  Filled 2018-07-26: qty 1

## 2018-07-26 MED ORDER — HYDROCOD POLST-CPM POLST ER 10-8 MG/5ML PO SUER
5.0000 mL | Freq: Two times a day (BID) | ORAL | Status: DC
  Administered 2018-07-26 – 2018-07-31 (×12): 5 mL via ORAL
  Filled 2018-07-26 (×12): qty 5

## 2018-07-26 MED ORDER — SODIUM CHLORIDE 0.9% FLUSH: 3.0000 mL | INTRAVENOUS | Status: DC | PRN

## 2018-07-26 NOTE — Consult Note (Addendum)
Cardiology Consultation:   Patient ID: [name]; [id number]; [birth year]   Admit date: 07/25/2018 Date of Consult: 07/26/2018  Primary Care Provider: Street, Sharon Mt, MD Primary Cardiologist: Lauree Chandler, MD Primary Electrophysiologist:  None   Patient Profile:   Gregory Bates is a 77 y.o. male with a PMH of diffuse moderate CAD s/p STEMI in 2013 and NSTEMI 2014 w PCI to RCA and OM1, chronic diastolic CHF, paroxysmal atrial fibrillation on Eliquis, HTN, HLD, DM type 2, CKD stage 3, who is being seen today for the evaluation of chest pain at the request of Dr. Evangeline Bates.  History of Present Illness:   Gregory Bates presents with complaints of intermittent chest pain over the past 2-3 days. He initially presented to the ED 07/24/18 with the same chest pain complaints, relieved with nitroglycerin, and was recommended for observation to rule out ACS, however the patient left AMA. Around 11pm last night he was awoken from sleep with another episode of chest pain which was left sided and non-radiating, again relieved with nitroglycerin prompting him to present to the ED again for evaluation. Neither episode was associated with diaphoresis, nausea, vomiting, SOB, lightheadedness, dizziness, or syncope.   He was last seen by Dr. Angelena Form Gregory/2019 for routine follow-up and was felt to be doing okay from a cardiac standpoint. His last [facility] 05/2017 revealed moderate 3 vessel disease without clear focal lesion suitable for PCI. Per Dr. Martinique who performed the catheterization, PCI of the PDA would be very challenging due to severe calcifications, tortuosity, and multiple prior stents in the RCA. Echo 11/2017 with preserved EF 55-60%, mild AI, and mild-moderate MR.   At the time of this evaluation he is chest pain free. He denies having difficulty with chest pain since his last [facility] 05/2017. He does report worsening DOE for the past month, stating that he gets winded with very little activity. Her  reports having an intermittent non-productive cough. He denies orthopnea, PND, LE edema, SOB at rest, or problems with bleeding. His last dose of eliquis was the night of 07/25/18.   Hospital course: VSS. Labs notable for electrolytes wnl, Cr 1.54 (baseline), Hgb 10.0 (baseline), PLT 187, Trop 0.03>0.04. CXR with prominence of R paratracheal stripe/ R suprahilar contour recommended for CT Chest w contrast to r/o adenopathy/lung mass; hyperinflated lungs suggesting COPD. EKG with sinus rhythm without STE/D, early repolarization, no TWI. He was admitted to medicine for unstable angina and started on a heparin gtt. Cardiology asked to evaluate for further recommendations.   Past Medical History:  Diagnosis Date  . Acute renal insufficiency    a. During 11/2013 adm with atrial flutter.  . Anemia, unspecified   . CAD (coronary artery disease), native coronary artery    a. s/p cath May 2011 >> DES distal RCA;  b. Gregory/2014 NSTEMI >> OM1 99p (2.75x12 Promus Premier DES);  c. LHC (2/15): PCI: Promus DES to dist RCA ;  d. LHC 1/16 - dLM 24, oLAD 30, small D1 tandem 23, pOM1 stent ok, mOM 50-60, dOM bifurcation 50-60 in both vessels, AV 66-70 jailed by stent, pRCA 67 mRCA stent ok, dRCA stent ok, PLB smal 50, dPDA 60, 80; EF 35-40 >> med Rx - PCI of PDA if angina  . Chronic combined systolic and diastolic CHF (congestive heart failure) ([place]) 12/23/2015   Echo 2/17: Mild LVH, EF 45-50%, inferior akinesis, grade 2 diastolic dysfunction, mild AI, mild MR, mild LAE, PASP 44 mmHg   . Colorectal polyps 2002  .  Diabetes mellitus    AODM  . Erectile dysfunction   . History of echocardiogram    a. Gregory/2014 Echo: EF 55%, mid-dist inflat HK, Gr1 DD, Triv AI/TR, Mild MR.;  b.  Echo (1/15): Mild LVH, EF 55%, inferolateral hypokinesis, grade 1 diastolic dysfunction, mild AI, trivial MR, moderate LAE, PASP 36 mmHg   . Hx of cardiovascular stress test    Stress myoview 08/07/13 with LVEF 44%, inferior scar, no ischemia  .  Hyperlipidemia   . Hypertension   . Ischemic cardiomyopathy    a. EF 35-40% by LHC in 1/16; b. Echo 2/17: Mild LVH, EF 45-50%, inferior akinesis, grade 2 diastolic dysfunction, mild AI, mild MR, mild LAE, PASP 44 mmHg  . Myocardial infarction ([place])   . PAF (paroxysmal atrial fibrillation) ([place])    a. Dx 11/2013. Spont conv. Placed on eliquis.    Past Surgical History:  Procedure Laterality Date  . COLONOSCOPY W/ POLYPECTOMY  2002   [facility]  . COLONOSCOPY W/ POLYPECTOMY  2004; 2007 negative  . CORONARY ANGIOPLASTY  12/26/2013  . CORONARY STENT PLACEMENT  2007, 2011  . infantile paralysis facial asymmetry    . LEFT HEART CATH AND CORONARY ANGIOGRAPHY N/A 05/18/2017   Procedure: Left Heart Cath and Coronary Angiography;  Surgeon: Martinique, Peter M, MD;  Location: [place] [facility];  Service: Cardiovascular;  Laterality: N/A;  . LEFT HEART CATHETERIZATION WITH CORONARY ANGIOGRAM N/A 10/02/2012   Procedure: LEFT HEART CATHETERIZATION WITH CORONARY ANGIOGRAM;  Surgeon: Peter M Martinique, MD;  Location: [facility] CATH LAB;  Service: Cardiovascular;  Laterality: N/A;  . LEFT HEART CATHETERIZATION WITH CORONARY ANGIOGRAM N/A Gregory/21/2014   Procedure: LEFT HEART CATHETERIZATION WITH CORONARY ANGIOGRAM;  Surgeon: Burnell Blanks, MD;  Location: [facility] CATH LAB;  Service: Cardiovascular;  Laterality: N/A;  . LEFT HEART CATHETERIZATION WITH CORONARY ANGIOGRAM N/A 12/26/2013   Procedure: LEFT HEART CATHETERIZATION WITH CORONARY ANGIOGRAM;  Surgeon: Burnell Blanks, MD;  Location: [facility] CATH LAB;  Service: Cardiovascular;  Laterality: N/A;  . LEFT HEART CATHETERIZATION WITH CORONARY ANGIOGRAM N/A 11/28/2014   Procedure: LEFT HEART CATHETERIZATION WITH CORONARY ANGIOGRAM;  Surgeon: Burnell Blanks, MD;  Location: [facility] CATH LAB;  Service: Cardiovascular;  Laterality: N/A;  . PERCUTANEOUS CORONARY STENT INTERVENTION (PCI-S)  10/02/2012   Procedure: PERCUTANEOUS CORONARY STENT INTERVENTION (PCI-S);  Surgeon:  Peter M Martinique, MD;  Location: [facility] CATH LAB;  Service: Cardiovascular;;  . PERCUTANEOUS CORONARY STENT INTERVENTION (PCI-S)  Gregory/21/2014   Procedure: PERCUTANEOUS CORONARY STENT INTERVENTION (PCI-S);  Surgeon: Burnell Blanks, MD;  Location: [facility] CATH LAB;  Service: Cardiovascular;;  . PERCUTANEOUS CORONARY STENT INTERVENTION (PCI-S)  12/26/2013   Procedure: PERCUTANEOUS CORONARY STENT INTERVENTION (PCI-S);  Surgeon: Burnell Blanks, MD;  Location: [facility] CATH LAB;  Service: Cardiovascular;;  . WISDOM TOOTH EXTRACTION       Home Medications:  Prior to Admission medications   Medication Sig Start Date End Date Taking? Authorizing Provider  amiodarone (PACERONE) 200 MG tablet Take 1 tablet (200 mg total) by mouth daily. Gregory/22/19  Yes Burnell Blanks, MD  amLODipine (NORVASC) 5 MG tablet TAKE 1 TABLET EVERY DAY Patient taking differently: Take 5 mg by mouth daily.  08/20/17  Yes Weaver, Scott T, PA-C  atorvastatin (LIPITOR) 20 MG tablet Take 1 tablet (20 mg total) by mouth daily. 11/19/17  Yes Burnell Blanks, MD  cholecalciferol (VITAMIN D) 1000 UNITS tablet Take 1,000 Units by mouth every evening.    Yes [provider]  clopidogrel (PLAVIX) 75  MG tablet TAKE 1 TABLET EVERY DAY WITH BREAKFAST Patient taking differently: Take 75 mg by mouth daily.  Gregory/22/19  Yes McAlhany, Annita Brod, MD  ELIQUIS 5 MG TABS tablet TAKE 1 TABLET BY MOUTH TWICE DAILY Patient taking differently: Take 5 mg by mouth 2 (two) times daily.  11/24/17  Yes Burnell Blanks, MD  fexofenadine (ALLEGRA) 180 MG tablet Take 180 mg by mouth daily as needed for allergies.    Yes [provider]  furosemide (LASIX) 20 MG tablet Take 1 tablet (20 mg total) by mouth daily. Gregory/22/19  Yes Burnell Blanks, MD  isosorbide mononitrate (IMDUR) 60 MG 24 hr tablet Take 1 tablet (60 mg total) by mouth daily. 11/19/17  Yes Burnell Blanks, MD  lisinopril (PRINIVIL,ZESTRIL) 20 MG tablet  Take 1 tablet (20 mg total) by mouth daily. Gregory/22/19  Yes Burnell Blanks, MD  metFORMIN (GLUCOPHAGE) 500 MG tablet Take 500 mg by mouth daily.   Yes [provider]  metoprolol tartrate (LOPRESSOR) 25 MG tablet Take 1 tablet (25 mg total) by mouth 2 (two) times daily. Gregory/22/19  Yes Burnell Blanks, MD  Multiple Vitamin (MULTIVITAMIN) tablet Take 1 tablet by mouth at bedtime.    Yes [provider]  nitroGLYCERIN (NITROSTAT) 0.Gregory MG SL tablet Place 0.Gregory mg under the tongue every 5 (five) minutes as needed for chest pain.   Yes [provider]  vitamin E 100 UNIT capsule Take 100 Units by mouth at bedtime.    Yes [provider]    Inpatient Medications: Scheduled Meds: . amiodarone  200 mg Oral Daily  . atorvastatin  20 mg Oral q1800  . cholecalciferol  1,000 Units Oral QPM  . clopidogrel  75 mg Oral Daily  . furosemide  20 mg Oral Daily  . insulin aspart  0-9 Units Subcutaneous Q4H  . isosorbide mononitrate  60 mg Oral Daily  . lisinopril  20 mg Oral Daily  . metoprolol tartrate  25 mg Oral BID  . multivitamin with minerals  1 tablet Oral QHS   Continuous Infusions: . heparin 1,200 Units/hr (07/26/18 0618)   PRN Meds: acetaminophen, morphine injection, nitroGLYCERIN, ondansetron (ZOFRAN) IV  Allergies:    Allergies  Allergen Reactions  . Brilinta [Ticagrelor] Other (See Comments)    Arthralgias & myalgias  . Crestor [Rosuvastatin] Other (See Comments)    Myalgias    Social History:   Social History   Socioeconomic History  . Marital status: Married    Spouse name: Not on file  . Number of children: 1  . Years of education: Not on file  . Highest education level: Not on file  Occupational History  . Occupation: maintenance    Comment: full time  Social Needs  . Financial resource strain: Not hard at all  . Food insecurity:    Worry: Never true    Inability: Never true  . Transportation needs:    Medical: Yes     Non-medical: Yes  Tobacco Use  . Smoking status: Former Smoker    Last attempt to quit: 11/02/1984    Years since quitting: 33.7  . Smokeless tobacco: Never Used  . Tobacco comment: smoked [place], up to 3 ppd (!)  Substance and Sexual Activity  . Alcohol use: No  . Drug use: No  . Sexual activity: Not on file  Lifestyle  . Physical activity:    Days per week: Not on file    Minutes per session: Not on file  .  Stress: Not at all  Relationships  . Social connections:    Talks on phone: Not on file    Gets together: Not on file    Attends religious service: Not on file    Active member of club or organization: Not on file    Attends meetings of clubs or organizations: Not on file    Relationship status: Not on file  . Intimate partner violence:    Fear of current or ex partner: Not on file    Emotionally abused: Not on file    Physically abused: Not on file    Forced sexual activity: Not on file  Other Topics Concern  . Not on file  Social History Narrative   [employer] - does maintenance    Family History:    Family History  Problem Relation Age of Onset  . Heart attack Brother 27  . Colon cancer Brother   . Diabetes Neg Hx   . Stroke Neg Hx   . Hypertension Neg Hx      ROS:  Please see the history of present illness.   All other ROS reviewed and negative.     Physical Exam/Data:   Vitals:   07/26/18 0227 07/26/18 0310 07/26/18 0318 07/26/18 0329  BP: (!) 130/58 139/66  139/66  Pulse: 63 60  62  Resp: 16 16  18   Temp:  98.2 F (36.8 C)  98.2 F (36.8 C)  TempSrc:  Oral  Oral  SpO2: 100% 98%  98%  Weight:   85.5 kg   Height:   5\' 11"  (1.803 m)    No intake or output data in the 24 hours ending 07/26/18 0741 Filed Weights   07/26/18 0006 07/26/18 0318  Weight: 83.9 kg 85.5 kg   Body mass index is 26.29 kg/m.  General:  Well nourished, well developed, laying in bed in no acute distress HEENT: sclera anicteric  Neck: no JVD Vascular: No  carotid bruits; distal pulses 2+ bilaterally Cardiac:  normal S1, S2; RRR; no murmurs, rubs, or gallops Lungs:  clear to auscultation bilaterally, no wheezing or rales; scattered rhonchi/rubs.   Abd: NABS, soft, nontender, no hepatomegaly Ext: no edema Musculoskeletal:  No deformities, BUE and BLE strength normal and equal Skin: warm and dry  Neuro:  CNs 2-12 intact, no focal abnormalities noted Psych:  Normal affect   EKG:  The EKG was personally reviewed and demonstrates:  sinus rhythm without STE/D, early repolarization, no TWI.  Telemetry:  Telemetry was personally reviewed and demonstrates:  NSR  Relevant CV Studies: Left heart catheterization 05/2017: 1. 3 vessel obstructive CAD    - 40% distal left main    - 50% ostial LAD. The LAD is relatively small.    - diffuse 70% mid first diagonal. This is a large branch    - 75% mid OM 1. Stent in the proximal vessel is still patent    - 80% distal LCx after OM1    - 60% proximal RCA, stents in the mid and distal RCA are patent    - 90% PDA 2. Moderate LV dysfunction with inferior HK. EF 40-45% 3. Normal LVEDP  Plan: compared to prior cath in 2016 there is no significant change. He has 3 vessel diffuse disease without clear focal lesion suitable for PCI. PCI of the PDA would be very challenging due to severe calcification, tortuosity and multiple prior stents in RCA. Will discuss with Dr. Angelena Form.  Laboratory Data:  Chemistry Recent Labs  Lab 07/24/18 1118 07/26/18 0055  NA 133* 135  K Gregory.3 Gregory.8  CL 102 102  CO2 22 23  GLUCOSE 200* 128*  BUN 18 16  CREATININE 1.61* 1.54*  CALCIUM 9.0 9.Gregory  GFRNONAA 40* 42*  GFRAA 46* 48*  ANIONGAP 9 10    No results for input(s): PROT, ALBUMIN, AST, ALT, ALKPHOS, BILITOT in the last 168 hours. Hematology Recent Labs  Lab 07/24/18 1118 07/26/18 0055  WBC 10.1 7.2  RBC 3.45* 3.32*  HGB 10.Gregory* 10.0*  HCT 32.1* 31.2*  MCV 93.0 94.0  MCH 30.1 30.1  MCHC 32.Gregory 32.1  RDW 13.2 13.2  PLT  183 187   Cardiac Enzymes Recent Labs  Lab 07/26/18 0228  TROPONINI 0.04*    Recent Labs  Lab 07/24/18 1127 07/26/18 0057  TROPIPOC 0.00 0.03    BNP Recent Labs  Lab 07/24/18 1118  BNP 121.9*    DDimer No results for input(s): DDIMER in the last 168 hours.  Radiology/Studies:  Dg Chest 2 View  Result Date: 07/26/2018 CLINICAL DATA:  Chest pain EXAM: CHEST - 2 VIEW COMPARISON:  07/24/2018 chest radiograph. FINDINGS: Normal heart size. Prominence of the right paratracheal stripe/right superior hilar contour. Aortic atherosclerosis. No pneumothorax. No pleural effusion. Hyperinflated lungs. IMPRESSION: Prominence of the right paratracheal stripe/right suprahilar contour. Recommend chest CT with IV contrast to exclude adenopathy/lung mass. Hyperinflated lungs, suggesting COPD. Electronically Signed   By: Ilona Sorrel M.D.   On: 07/26/2018 01:14   Dg Chest 2 View  Result Date: 07/24/2018 CLINICAL DATA:  Pt reports cough x yesterday and left sided chest pain for over 1 hour; Hx of MI w/ 5 stents, DM, and HTN; former smoker EXAM: CHEST - 2 VIEW COMPARISON:  07/19/2018 FINDINGS: Cardiac silhouette is normal in size and configuration no mediastinal or hilar masses. No evidence of adenopathy. Clear lungs.  No pleural effusion or pneumothorax. Skeletal structures are demineralized but intact. IMPRESSION: No active cardiopulmonary disease. Electronically Signed   By: Lajean Manes M.D.   On: 07/24/2018 12:17    Assessment and Plan:   1. Unstable angina in patient with moderate multivessel CAD with PCI/DES to RCA and OM1: patient presented with 2-3 day history of chest pain relieved with nitroglycerin c/f unstable angina. Trop 0.03>0.04. EKG non-ischemic. CXR without pulmonary edema. Last LHC in 05/2017 with diffuse moderate disease without focal lesion suitable for PCI. Echo 11/2017 with EF 55-60%. Last dose of apixaban was in the evening of 07/25/18. Would benefit from further ischemic evaluation   - Will plan for [facility] tomorrow to further evaluate chest pain - Continue to hold apixaban - Can continue heparin gtt for now - Continue plavix, statin, imdur, and BBlocker - adjustment to medications pending LHC.   2. Chronic diastolic CHF: no volume overload complaints. CXR without pulmonary edema. BNP from 07/24/18 121. He appears euvolemic on exam.  - Continue BBlocker  - Will hold lisinopril and lasix tomorrow  3. Paroxysmal atrial fibrillation: maintaining sinus rhythm this admission. On amiodarone and metoprolol for rate/rhythm control. On eliquis for CHA2DS2-VASc Score of 6 (CHF, HTN, DM, Vascular, and Age >75). Hgb stable and no complaints of bleeding - Continue amiodarone and metoprolol - Continue to hold home apixaban in anticipation of LHC tomorrow  Gregory. HTN: BP mildly elevated but near goal - Continue lisinopril, metoprolol, lasix, and imdur for now - Will hold lisinopril and lasix tomorrow  5. HLD: no recent lipids on file  - Will check a FLP in AM -  Continue statin  6. DM type 2: A1C 6.3 10/2017; at goal of <7 - Continue management per primary team  7. Abnormal CXR: recommended for CT chest with contrast to further evaluate for adenopathy/mass. Appreciate primary team delaying study in the event of further ischemic evaluation needs. - Will need CT Chest per primary team after ischemic evaluation.  8. CKD stage 3: Cr 1.54 today (baseline) - Continue to monitor closely - Will hold lisinopril and lasix tomorrow    For questions or updates, please contact [place] Please consult [email or web address] for contact info under Cardiology/STEMI.   Signed, Abigail Butts, PA-C  07/26/2018 7:41 AM [phone]  Personally seen and examined. Agree with above.  Gregory Bates with known diffuse coronary artery disease on Eliquis for paroxysmal atrial fibrillation with diabetes here with unstable angina.  Currently chest pain-free in bed.  No shortness of breath  GEN: Well  nourished, well developed, in no acute distress  HEENT: normal  Neck: no JVD, carotid bruits, or masses Cardiac: RRR; no murmurs, rubs, or gallops,no edema  Respiratory:  clear to auscultation bilaterally, normal work of breathing GI: soft, nontender, nondistended, + BS MS: no deformity or atrophy  Skin: warm and dry, no rash Neuro:  Alert and Oriented x 3, Strength and sensation are intact Psych: euthymic mood, full affect  EKG personally reviewed and interpreted shows sinus rhythm, subtle early repolarization changes.  Telemetry also personally reviewed shows normal sinus rhythm.  Cardiac catheterization 05/2017 demonstrated three-vessel coronary artery disease with diffusely calcified PDA, 40% distal left main, 50% ostial LAD There was no significant change from prior cath in 2016.  Assessment and plan:  Unstable angina - We will go ahead and get him set up for cardiac catheterization tomorrow.  He will hold his Eliquis.  Last dose 07/25/2018 evening.  Risks and benefits have been described including stroke heart attack death renal impairment bleeding.  He is willing to proceed.  If there has been any advancement in his coronary artery disease especially distal left main disease, we may need to consider CABG.  Continue isosorbide, metoprolol.  Chronic diastolic heart failure - Doing well.  Holding lisinopril and Lasix for cath.  Paroxysmal atrial fibrillation - CHADSVASc 6, holding Eliquis. -Continuing with amiodarone and metoprolol.  Diabetes with hypertension and hyperlipidemia - No changes.  A1c excellent at 6.3.  Continue statin therapy  Candee Furbish, MD

## 2018-07-26 NOTE — ED Provider Notes (Signed)
[facility] EMERGENCY DEPARTMENT Provider Note   CSN: [id number] Arrival date & time: 07/25/18  2355     History   Chief Complaint Chief Complaint  Patient presents with  . Chest Pain    HPI Gregory Bates is a 77 y.o. male.  The history is provided by the patient.  Has a history of coronary artery disease, combined systolic and diastolic heart failure, paroxysmal atrial fibrillation, diabetes, hyperlipidemia and comes in with an episode of chest pain which woke him up from sleep.  He describes a dull, pressure feeling in the left chest which she rated at 6/10.  There is associated dyspnea and diaphoresis but no nausea.  There is no radiation of pain.  Pain was typical of his cardiac pain.  He took 2 nitroglycerin with relief.  He had a similar episode of pain yesterday, but that was while he was awake.  He was in the ED and admission recommended, but he refused admission.  He states he is willing to be admitted now.  He does admit to progressive weakness and exertional dyspnea over the last month.  Past Medical History:  Diagnosis Date  . Acute renal insufficiency    a. During 11/2013 adm with atrial flutter.  . Anemia, unspecified   . CAD (coronary artery disease), native coronary artery    a. s/p cath May 2011 >> DES distal RCA;  b. 01/2013 NSTEMI >> OM1 99p (2.75x12 Promus Premier DES);  c. LHC (2/15): PCI: Promus DES to dist RCA ;  d. LHC 1/16 - dLM 62, oLAD 30, small D1 tandem 11, pOM1 stent ok, mOM 50-60, dOM bifurcation 50-60 in both vessels, AV 34-70 jailed by stent, pRCA 64 mRCA stent ok, dRCA stent ok, PLB smal 50, dPDA 60, 80; EF 35-40 >> med Rx - PCI of PDA if angina  . Chronic combined systolic and diastolic CHF (congestive heart failure) ([place]) 12/23/2015   Echo 2/17: Mild LVH, EF 45-50%, inferior akinesis, grade 2 diastolic dysfunction, mild AI, mild MR, mild LAE, PASP 44 mmHg   . Colorectal polyps 2002  . Diabetes mellitus    AODM  . Erectile  dysfunction   . History of echocardiogram    a. 01/2013 Echo: EF 55%, mid-dist inflat HK, Gr1 DD, Triv AI/TR, Mild MR.;  b.  Echo (1/15): Mild LVH, EF 55%, inferolateral hypokinesis, grade 1 diastolic dysfunction, mild AI, trivial MR, moderate LAE, PASP 36 mmHg   . Hx of cardiovascular stress test    Stress myoview 08/07/13 with LVEF 44%, inferior scar, no ischemia  . Hyperlipidemia   . Hypertension   . Ischemic cardiomyopathy    a. EF 35-40% by LHC in 1/16; b. Echo 2/17: Mild LVH, EF 45-50%, inferior akinesis, grade 2 diastolic dysfunction, mild AI, mild MR, mild LAE, PASP 44 mmHg  . Myocardial infarction ([place])   . PAF (paroxysmal atrial fibrillation) ([place])    a. Dx 11/2013. Spont conv. Placed on eliquis.    Patient Active Problem List   Diagnosis Date Noted  . Hyponatremia 10/15/2017  . Nausea & vomiting 10/15/2017  . Chronic combined systolic and diastolic CHF (congestive heart failure) ([place]) 12/23/2015  . Exertional angina ([facility]) 11/21/2014  . Unstable angina ([place]) 12/26/2013  . Atrial flutter ([place]) 11/03/2013  . NSTEMI (non-ST elevated myocardial infarction) ([place]) 02/17/2013  . ST elevation myocardial infarction (STEMI) of inferior wall, initial episode of care ([place]) 10/02/2012  . CAD (coronary artery disease) 10/02/2012  . Type 2 diabetes mellitus with  vascular disease ([place]) 09/18/2010  . Essential hypertension 03/28/2010  . COLONIC POLYPS, RECURRENT 04/26/2008  . HYPERLIPIDEMIA 10/07/2007  . ERECTILE DYSFUNCTION 10/07/2007    Past Surgical History:  Procedure Laterality Date  . COLONOSCOPY W/ POLYPECTOMY  2002   [facility]  . COLONOSCOPY W/ POLYPECTOMY  2004; 2007 negative  . CORONARY ANGIOPLASTY  12/26/2013  . CORONARY STENT PLACEMENT  2007, 2011  . infantile paralysis facial asymmetry    . LEFT HEART CATH AND CORONARY ANGIOGRAPHY N/A 05/18/2017   Procedure: Left Heart Cath and Coronary Angiography;  Surgeon: Martinique, Peter M, MD;  Location: [facility];  Service:  Cardiovascular;  Laterality: N/A;  . LEFT HEART CATHETERIZATION WITH CORONARY ANGIOGRAM N/A 10/02/2012   Procedure: LEFT HEART CATHETERIZATION WITH CORONARY ANGIOGRAM;  Surgeon: Peter M Martinique, MD;  Location: [facility] CATH LAB;  Service: Cardiovascular;  Laterality: N/A;  . LEFT HEART CATHETERIZATION WITH CORONARY ANGIOGRAM N/A 02/20/2013   Procedure: LEFT HEART CATHETERIZATION WITH CORONARY ANGIOGRAM;  Surgeon: Burnell Blanks, MD;  Location: [facility] CATH LAB;  Service: Cardiovascular;  Laterality: N/A;  . LEFT HEART CATHETERIZATION WITH CORONARY ANGIOGRAM N/A 12/26/2013   Procedure: LEFT HEART CATHETERIZATION WITH CORONARY ANGIOGRAM;  Surgeon: Burnell Blanks, MD;  Location: [facility] CATH LAB;  Service: Cardiovascular;  Laterality: N/A;  . LEFT HEART CATHETERIZATION WITH CORONARY ANGIOGRAM N/A 11/28/2014   Procedure: LEFT HEART CATHETERIZATION WITH CORONARY ANGIOGRAM;  Surgeon: Burnell Blanks, MD;  Location: [facility] CATH LAB;  Service: Cardiovascular;  Laterality: N/A;  . PERCUTANEOUS CORONARY STENT INTERVENTION (PCI-S)  10/02/2012   Procedure: PERCUTANEOUS CORONARY STENT INTERVENTION (PCI-S);  Surgeon: Peter M Martinique, MD;  Location: [facility] CATH LAB;  Service: Cardiovascular;;  . PERCUTANEOUS CORONARY STENT INTERVENTION (PCI-S)  02/20/2013   Procedure: PERCUTANEOUS CORONARY STENT INTERVENTION (PCI-S);  Surgeon: Burnell Blanks, MD;  Location: [facility] CATH LAB;  Service: Cardiovascular;;  . PERCUTANEOUS CORONARY STENT INTERVENTION (PCI-S)  12/26/2013   Procedure: PERCUTANEOUS CORONARY STENT INTERVENTION (PCI-S);  Surgeon: Burnell Blanks, MD;  Location: [facility] CATH LAB;  Service: Cardiovascular;;  . WISDOM TOOTH EXTRACTION          Home Medications    Prior to Admission medications   Medication Sig Start Date End Date Taking? Authorizing Provider  amiodarone (PACERONE) 200 MG tablet Take 1 tablet (200 mg total) by mouth daily. 02/21/18   Burnell Blanks, MD  amLODipine (NORVASC) 5 MG  tablet TAKE 1 TABLET EVERY DAY Patient taking differently: Take 5 mg by mouth daily.  08/20/17   Richardson Dopp T, PA-C  apixaban (ELIQUIS) 5 MG TABS tablet Take 1 tablet (5 mg total) by mouth 2 (two) times daily. 11/17/17   Burnell Blanks, MD  atorvastatin (LIPITOR) 20 MG tablet Take 1 tablet (20 mg total) by mouth daily. 11/19/17   Burnell Blanks, MD  cholecalciferol (VITAMIN D) 1000 UNITS tablet Take 1,000 Units by mouth every evening.     [provider]  clopidogrel (PLAVIX) 75 MG tablet TAKE 1 TABLET EVERY DAY WITH BREAKFAST Patient taking differently: Take 75 mg by mouth daily.  02/21/18   Burnell Blanks, MD  ELIQUIS 5 MG TABS tablet TAKE 1 TABLET BY MOUTH TWICE DAILY Patient taking differently: Take 5 mg by mouth 2 (two) times daily.  11/24/17   Burnell Blanks, MD  fexofenadine (ALLEGRA) 180 MG tablet Take 180 mg by mouth daily as needed for allergies.     [provider]  furosemide (LASIX) 20 MG tablet Take 1 tablet (  20 mg total) by mouth daily. 02/21/18   Burnell Blanks, MD  isosorbide mononitrate (IMDUR) 60 MG 24 hr tablet Take 1 tablet (60 mg total) by mouth daily. 11/19/17   Burnell Blanks, MD  lisinopril (PRINIVIL,ZESTRIL) 20 MG tablet Take 1 tablet (20 mg total) by mouth daily. 02/21/18   Burnell Blanks, MD  metFORMIN (GLUCOPHAGE) 500 MG tablet Take 500 mg by mouth daily.    [provider]  metoprolol tartrate (LOPRESSOR) 25 MG tablet Take 1 tablet (25 mg total) by mouth 2 (two) times daily. 02/21/18   Burnell Blanks, MD  Multiple Vitamin (MULTIVITAMIN) tablet Take 1 tablet by mouth at bedtime.     [provider]  nitroGLYCERIN (NITROSTAT) 0.4 MG SL tablet Place 0.4 mg under the tongue every 5 (five) minutes as needed for chest pain.    [provider]  vitamin E 100 UNIT capsule Take 100 Units by mouth at bedtime.     [provider]    Family History Family  History  Problem Relation Age of Onset  . Heart attack Brother 33  . Colon cancer Brother   . Diabetes Neg Hx   . Stroke Neg Hx   . Hypertension Neg Hx     Social History Social History   Tobacco Use  . Smoking status: Former Smoker    Last attempt to quit: 11/02/1984    Years since quitting: 33.7  . Smokeless tobacco: Never Used  . Tobacco comment: smoked [place], up to 3 ppd (!)  Substance Use Topics  . Alcohol use: No  . Drug use: No     Allergies   Brilinta [ticagrelor] and Crestor [rosuvastatin]   Review of Systems Review of Systems  All other systems reviewed and are negative.    Physical Exam Updated Vital Signs BP (!) 127/57 (BP Location: Left Arm)   Pulse 68   Temp 98.4 F (36.9 C) (Oral)   Resp 14   Ht 5\' 11"  (1.803 m)   Wt 83.9 kg   SpO2 97%   BMI 25.80 kg/m   Physical Exam  Nursing note and vitals reviewed.  77 year old male, resting comfortably and in no acute distress. Vital signs are normal. Oxygen saturation is 97%, which is normal. Head is normocephalic and atraumatic. PERRLA, EOMI. Oropharynx is clear. Neck is nontender and supple without adenopathy or JVD. Back is nontender and there is no CVA tenderness. Lungs are clear without rales, wheezes, or rhonchi. Chest is nontender. Heart has regular rate and rhythm without murmur. Abdomen is soft, flat, nontender without masses or hepatosplenomegaly and peristalsis is normoactive. Extremities have no cyanosis or edema, full range of motion is present. Skin is warm and dry without rash. Neurologic: Mental status is normal, cranial nerves are significant for left facial droop, there are no motor or sensory deficits.  ED Treatments / Results  Labs (all labs ordered are listed, but only abnormal results are displayed) Labs Reviewed  BASIC METABOLIC PANEL - Abnormal; Notable for the following components:      Result Value   Glucose, Bld 128 (*)    Creatinine, Ser 1.54 (*)    GFR calc non  Af Amer 42 (*)    GFR calc Af Amer 48 (*)    All other components within normal limits  PROTIME-INR - Abnormal; Notable for the following components:   Prothrombin Time 16.2 (*)    All other components within normal limits  CBC WITH DIFFERENTIAL/PLATELET -  Abnormal; Notable for the following components:   RBC 3.32 (*)    Hemoglobin 10.0 (*)    HCT 31.2 (*)    All other components within normal limits  I-STAT TROPONIN, ED    EKG EKG Interpretation  Date/Time:  Monday July 25 2018 23:59:28 EDT Ventricular Rate:  65 PR Interval:    QRS Duration: 114 QT Interval:  420 QTC Calculation: 437 R Axis:   49 Text Interpretation:  Sinus rhythm Short PR interval Borderline intraventricular conduction delay Borderline repolarization abnormality When compared with ECG of 07/24/2018, Sinus rhythm has replaced Supraventricular tachycardia Confirmed by Delora Fuel ([id number]) on 07/26/2018 12:03:19 AM   Radiology Dg Chest 2 View  Result Date: 07/24/2018 CLINICAL DATA:  Pt reports cough x yesterday and left sided chest pain for over 1 hour; Hx of MI w/ 5 stents, DM, and HTN; former smoker EXAM: CHEST - 2 VIEW COMPARISON:  07/19/2018 FINDINGS: Cardiac silhouette is normal in size and configuration no mediastinal or hilar masses. No evidence of adenopathy. Clear lungs.  No pleural effusion or pneumothorax. Skeletal structures are demineralized but intact. IMPRESSION: No active cardiopulmonary disease. Electronically Signed   By: Lajean Manes M.D.   On: 07/24/2018 12:17    Procedures Procedures  CRITICAL CARE Performed by: Delora Fuel Total critical care time: 55 minutes Critical care time was exclusive of separately billable procedures and treating other patients. Critical care was necessary to treat or prevent imminent or life-threatening deterioration. Critical care was time spent personally by me on the following activities: development of treatment plan with patient and/or surrogate as well  as nursing, discussions with consultants, evaluation of patient's response to treatment, examination of patient, obtaining history from patient or surrogate, ordering and performing treatments and interventions, ordering and review of laboratory studies, ordering and review of radiographic studies, pulse oximetry and re-evaluation of patient's condition.  Medications Ordered in ED Medications  aspirin chewable tablet 324 mg (has no administration in time range)     Initial Impression / Assessment and Plan / ED Course  I have reviewed the triage vital signs and the nursing notes.  Pertinent labs & imaging results that were available during my care of the patient were reviewed by me and considered in my medical decision making (see chart for details).  Chest discomfort consistent with unstable angina.  Old records are reviewed, and he had a catheterization 1 year ago showing several vessels with significant obstruction which were not felt to need intervention at that time.  Also, it is noted that he had ED visit on September 22 with clinical diagnosis of unstable angina and he left AGAINST MEDICAL ADVICE.  He is given aspirin.  He is anticoagulated on apixaban with last dose tonight, will need to start heparin once apixaban has worn off.  Pharmacy has been consulted for this.  Case is discussed with Dr. Gilmore Laroche of cardiology service who feels patient should be admitted to hospitalist service with cardiology consultation.  Case discussed with Dr. Myna Hidalgo of [facility], who agrees to admit the patient.  Of note, labs show normal troponin, stable renal insufficiency, stable anemia.  Chest x-ray shows no acute process.  Final Clinical Impressions(s) / ED Diagnoses   Final diagnoses:  Unstable angina ([place])  Renal insufficiency  Normochromic normocytic anemia    ED Discharge Orders    None       Delora Fuel, MD 12/87/86 [phone]

## 2018-07-26 NOTE — H&P (Signed)
History and Physical    Gregory Bates WSF:[id number] DOB: 13-Dec-1940 DOA: 07/25/2018  PCP: Emmaline Kluver, MD   Patient coming from: Home   Chief Complaint: Chest pain   HPI: Gregory Bates is a 77 y.o. male with medical history significant for coronary artery disease, chronic diastolic CHF, chronic kidney disease stage III, paroxysmal atrial fibrillation on Eliquis, hypertension, and type 2 diabetes mellitus, now presenting to the emergency department for evaluation of chest pain.  Patient has known diffuse CAD without suitable target for PCI on cath from 1 year ago.  He reports chest pain at rest that began 2 to 3 days ago, resolved with nitroglycerin, but has been recurring.  Patient was seen in the emergency department for this 2 days ago, admission was recommended, but he left AMA at that time.  He was in his usual state when he went to bed last night, but woke with left-sided chest pain and mild shortness of breath.  He took 2 nitroglycerin at home and pain had resolved prior to arrival in the ED.  He denies any fevers or chills, but reports a mild cough for the past couple weeks.  Denies abdominal pain, vomiting, or diarrhea.  ED Course: Upon arrival to the ED, patient is found to be afebrile, saturating well on room air, and with vitals otherwise stable.  EKG features a sinus rhythm with short PR.  Chest x-ray is notable for hyperinflation and a prominent right paratracheal stripe with contrast-enhanced CT recommended to exclude adenopathy or mass.  Chemistry panel features a creatinine 1.54, similar to priors.  CBC is notable for stable chronic normocytic anemia with hemoglobin 10.0.  Initial troponin is negative.  Patient was given 324 mg of aspirin in the ED and IV heparin was ordered.  Cardiology was consulted by the ED physician and recommended a medical admission.  Patient remains hemodynamically stable, chest pain-free at this time, and will be observed on the telemetry unit for  ongoing evaluation and management of unstable angina.  Review of Systems:  All other systems reviewed and apart from HPI, are negative.  Past Medical History:  Diagnosis Date  . Acute renal insufficiency    a. During 11/2013 adm with atrial flutter.  . Anemia, unspecified   . CAD (coronary artery disease), native coronary artery    a. s/p cath May 2011 >> DES distal RCA;  b. 01/2013 NSTEMI >> OM1 99p (2.75x12 Promus Premier DES);  c. LHC (2/15): PCI: Promus DES to dist RCA ;  d. LHC 1/16 - dLM 61, oLAD 30, small D1 tandem 16, pOM1 stent ok, mOM 50-60, dOM bifurcation 50-60 in both vessels, AV 73-70 jailed by stent, pRCA 66 mRCA stent ok, dRCA stent ok, PLB smal 50, dPDA 60, 80; EF 35-40 >> med Rx - PCI of PDA if angina  . Chronic combined systolic and diastolic CHF (congestive heart failure) ([place]) 12/23/2015   Echo 2/17: Mild LVH, EF 45-50%, inferior akinesis, grade 2 diastolic dysfunction, mild AI, mild MR, mild LAE, PASP 44 mmHg   . Colorectal polyps 2002  . Diabetes mellitus    AODM  . Erectile dysfunction   . History of echocardiogram    a. 01/2013 Echo: EF 55%, mid-dist inflat HK, Gr1 DD, Triv AI/TR, Mild MR.;  b.  Echo (1/15): Mild LVH, EF 55%, inferolateral hypokinesis, grade 1 diastolic dysfunction, mild AI, trivial MR, moderate LAE, PASP 36 mmHg   . Hx of cardiovascular stress test    Stress myoview 08/07/13  with LVEF 44%, inferior scar, no ischemia  . Hyperlipidemia   . Hypertension   . Ischemic cardiomyopathy    a. EF 35-40% by LHC in 1/16; b. Echo 2/17: Mild LVH, EF 45-50%, inferior akinesis, grade 2 diastolic dysfunction, mild AI, mild MR, mild LAE, PASP 44 mmHg  . Myocardial infarction ([place])   . PAF (paroxysmal atrial fibrillation) ([place])    a. Dx 11/2013. Spont conv. Placed on eliquis.    Past Surgical History:  Procedure Laterality Date  . COLONOSCOPY W/ POLYPECTOMY  2002   [facility]  . COLONOSCOPY W/ POLYPECTOMY  2004; 2007 negative  . CORONARY ANGIOPLASTY  12/26/2013    . CORONARY STENT PLACEMENT  2007, 2011  . infantile paralysis facial asymmetry    . LEFT HEART CATH AND CORONARY ANGIOGRAPHY N/A 05/18/2017   Procedure: Left Heart Cath and Coronary Angiography;  Surgeon: Martinique, Peter M, MD;  Location: [facility];  Service: Cardiovascular;  Laterality: N/A;  . LEFT HEART CATHETERIZATION WITH CORONARY ANGIOGRAM N/A 10/02/2012   Procedure: LEFT HEART CATHETERIZATION WITH CORONARY ANGIOGRAM;  Surgeon: Peter M Martinique, MD;  Location: [facility] CATH LAB;  Service: Cardiovascular;  Laterality: N/A;  . LEFT HEART CATHETERIZATION WITH CORONARY ANGIOGRAM N/A 02/20/2013   Procedure: LEFT HEART CATHETERIZATION WITH CORONARY ANGIOGRAM;  Surgeon: Burnell Blanks, MD;  Location: [facility] CATH LAB;  Service: Cardiovascular;  Laterality: N/A;  . LEFT HEART CATHETERIZATION WITH CORONARY ANGIOGRAM N/A 12/26/2013   Procedure: LEFT HEART CATHETERIZATION WITH CORONARY ANGIOGRAM;  Surgeon: Burnell Blanks, MD;  Location: [facility] CATH LAB;  Service: Cardiovascular;  Laterality: N/A;  . LEFT HEART CATHETERIZATION WITH CORONARY ANGIOGRAM N/A 11/28/2014   Procedure: LEFT HEART CATHETERIZATION WITH CORONARY ANGIOGRAM;  Surgeon: Burnell Blanks, MD;  Location: [facility] CATH LAB;  Service: Cardiovascular;  Laterality: N/A;  . PERCUTANEOUS CORONARY STENT INTERVENTION (PCI-S)  10/02/2012   Procedure: PERCUTANEOUS CORONARY STENT INTERVENTION (PCI-S);  Surgeon: Peter M Martinique, MD;  Location: [facility] CATH LAB;  Service: Cardiovascular;;  . PERCUTANEOUS CORONARY STENT INTERVENTION (PCI-S)  02/20/2013   Procedure: PERCUTANEOUS CORONARY STENT INTERVENTION (PCI-S);  Surgeon: Burnell Blanks, MD;  Location: [facility] CATH LAB;  Service: Cardiovascular;;  . PERCUTANEOUS CORONARY STENT INTERVENTION (PCI-S)  12/26/2013   Procedure: PERCUTANEOUS CORONARY STENT INTERVENTION (PCI-S);  Surgeon: Burnell Blanks, MD;  Location: [facility] CATH LAB;  Service: Cardiovascular;;  . WISDOM TOOTH EXTRACTION       reports  that he quit smoking about 33 years ago. He has never used smokeless tobacco. He reports that he does not drink alcohol or use drugs.  Allergies  Allergen Reactions  . Brilinta [Ticagrelor] Other (See Comments)    Arthralgias & myalgias  . Crestor [Rosuvastatin] Other (See Comments)    Myalgias    Family History  Problem Relation Age of Onset  . Heart attack Brother 46  . Colon cancer Brother   . Diabetes Neg Hx   . Stroke Neg Hx   . Hypertension Neg Hx      Prior to Admission medications   Medication Sig Start Date End Date Taking? Authorizing Provider  amiodarone (PACERONE) 200 MG tablet Take 1 tablet (200 mg total) by mouth daily. 02/21/18  Yes Burnell Blanks, MD  amLODipine (NORVASC) 5 MG tablet TAKE 1 TABLET EVERY DAY Patient taking differently: Take 5 mg by mouth daily.  08/20/17  Yes Weaver, Scott T, PA-C  atorvastatin (LIPITOR) 20 MG tablet Take 1 tablet (20 mg total) by mouth daily. 11/19/17  Yes Lauree Chandler  D, MD  cholecalciferol (VITAMIN D) 1000 UNITS tablet Take 1,000 Units by mouth every evening.    Yes [provider]  clopidogrel (PLAVIX) 75 MG tablet TAKE 1 TABLET EVERY DAY WITH BREAKFAST Patient taking differently: Take 75 mg by mouth daily.  02/21/18  Yes McAlhany, Annita Brod, MD  ELIQUIS 5 MG TABS tablet TAKE 1 TABLET BY MOUTH TWICE DAILY Patient taking differently: Take 5 mg by mouth 2 (two) times daily.  11/24/17  Yes Burnell Blanks, MD  fexofenadine (ALLEGRA) 180 MG tablet Take 180 mg by mouth daily as needed for allergies.    Yes [provider]  furosemide (LASIX) 20 MG tablet Take 1 tablet (20 mg total) by mouth daily. 02/21/18  Yes Burnell Blanks, MD  isosorbide mononitrate (IMDUR) 60 MG 24 hr tablet Take 1 tablet (60 mg total) by mouth daily. 11/19/17  Yes Burnell Blanks, MD  lisinopril (PRINIVIL,ZESTRIL) 20 MG tablet Take 1 tablet (20 mg total) by mouth daily. 02/21/18  Yes Burnell Blanks, MD  metFORMIN (GLUCOPHAGE) 500 MG tablet Take 500 mg by mouth daily.   Yes [provider]  metoprolol tartrate (LOPRESSOR) 25 MG tablet Take 1 tablet (25 mg total) by mouth 2 (two) times daily. 02/21/18  Yes Burnell Blanks, MD  Multiple Vitamin (MULTIVITAMIN) tablet Take 1 tablet by mouth at bedtime.    Yes [provider]  nitroGLYCERIN (NITROSTAT) 0.4 MG SL tablet Place 0.4 mg under the tongue every 5 (five) minutes as needed for chest pain.   Yes [provider]  vitamin E 100 UNIT capsule Take 100 Units by mouth at bedtime.    Yes [provider]    Physical Exam: Vitals:   07/25/18 2356 07/26/18 0006  BP: (!) 127/57   Pulse: 68   Resp: 14   Temp: 98.4 F (36.9 C)   TempSrc: Oral   SpO2: 97%   Weight:  83.9 kg  Height:  5\' 11"  (1.803 m)     Constitutional: NAD, calm  Eyes: PERTLA, lids and conjunctivae normal ENMT: Mucous membranes are moist. Posterior pharynx clear of any exudate or lesions.   Neck: normal, supple, no masses, no thyromegaly Respiratory: clear to auscultation bilaterally, no wheezing, no crackles. Normal respiratory effort.    Cardiovascular: S1 & S2 heard, regular rate and rhythm. No extremity edema.   Abdomen: No distension, no tenderness, soft. Bowel sounds normal.  Musculoskeletal: no clubbing / cyanosis. No joint deformity upper and lower extremities.   Skin: no significant rashes, lesions, ulcers. Warm, dry, well-perfused. Neurologic: CN 2-12 grossly intact. Sensation intact. Strength 5/5 in all 4 limbs.  Psychiatric: Alert and oriented x 3. Calm, cooperative.    Labs on Admission: I have personally reviewed following labs and imaging studies  CBC: Recent Labs  Lab 07/24/18 1118 07/26/18 0055  WBC 10.1 7.2  NEUTROABS  --  4.7  HGB 10.4* 10.0*  HCT 32.1* 31.2*  MCV 93.0 94.0  PLT 183 671   Basic Metabolic Panel: Recent Labs  Lab 07/24/18 1118 07/26/18 0055  NA 133* 135  K 4.3 4.8  CL 102  102  CO2 22 23  GLUCOSE 200* 128*  BUN 18 16  CREATININE 1.61* 1.54*  CALCIUM 9.0 9.4   GFR: Estimated Creatinine Clearance: 42.8 mL/min (A) (by C-G formula based on SCr of 1.54 mg/dL (H)). Liver Function Tests: No results for input(s): AST, ALT, ALKPHOS, BILITOT, PROT, ALBUMIN in the last 168 hours. No results for input(s):  LIPASE, AMYLASE in the last 168 hours. No results for input(s): AMMONIA in the last 168 hours. Coagulation Profile: Recent Labs  Lab 07/26/18 0055  INR 1.31   Cardiac Enzymes: No results for input(s): CKTOTAL, CKMB, CKMBINDEX, TROPONINI in the last 168 hours. BNP (last 3 results) No results for input(s): PROBNP in the last 8760 hours. HbA1C: No results for input(s): HGBA1C in the last 72 hours. CBG: No results for input(s): GLUCAP in the last 168 hours. Lipid Profile: No results for input(s): CHOL, HDL, LDLCALC, TRIG, CHOLHDL, LDLDIRECT in the last 72 hours. Thyroid Function Tests: No results for input(s): TSH, T4TOTAL, FREET4, T3FREE, THYROIDAB in the last 72 hours. Anemia Panel: No results for input(s): VITAMINB12, FOLATE, FERRITIN, TIBC, IRON, RETICCTPCT in the last 72 hours. Urine analysis:    Component Value Date/Time   COLORURINE YELLOW 10/16/2017 0548   APPEARANCEUR HAZY (A) 10/16/2017 0548   LABSPEC 1.010 10/16/2017 0548   PHURINE 7.0 10/16/2017 0548   GLUCOSEU NEGATIVE 10/16/2017 0548   HGBUR NEGATIVE 10/16/2017 0548   BILIRUBINUR NEGATIVE 10/16/2017 0548   KETONESUR NEGATIVE 10/16/2017 0548   PROTEINUR NEGATIVE 10/16/2017 0548   NITRITE NEGATIVE 10/16/2017 0548   LEUKOCYTESUR NEGATIVE 10/16/2017 0548   Sepsis Labs: @LABRCNTIP (procalcitonin:4,lacticidven:4) )No results found for this or any previous visit (from the past 240 hour(s)).   Radiological Exams on Admission: Dg Chest 2 View  Result Date: 07/26/2018 CLINICAL DATA:  Chest pain EXAM: CHEST - 2 VIEW COMPARISON:  07/24/2018 chest radiograph. FINDINGS: Normal heart size.  Prominence of the right paratracheal stripe/right superior hilar contour. Aortic atherosclerosis. No pneumothorax. No pleural effusion. Hyperinflated lungs. IMPRESSION: Prominence of the right paratracheal stripe/right suprahilar contour. Recommend chest CT with IV contrast to exclude adenopathy/lung mass. Hyperinflated lungs, suggesting COPD. Electronically Signed   By: Ilona Sorrel M.D.   On: 07/26/2018 01:14   Dg Chest 2 View  Result Date: 07/24/2018 CLINICAL DATA:  Pt reports cough x yesterday and left sided chest pain for over 1 hour; Hx of MI w/ 5 stents, DM, and HTN; former smoker EXAM: CHEST - 2 VIEW COMPARISON:  07/19/2018 FINDINGS: Cardiac silhouette is normal in size and configuration no mediastinal or hilar masses. No evidence of adenopathy. Clear lungs.  No pleural effusion or pneumothorax. Skeletal structures are demineralized but intact. IMPRESSION: No active cardiopulmonary disease. Electronically Signed   By: Lajean Manes M.D.   On: 07/24/2018 12:17    EKG: Independently reviewed. Sinus rhythm, short PR.   Assessment/Plan  1. Unstable angina; CAD  - Presents with 2-3 days of intermittent chest pain at rest, relieved by NTG - EKG with no acute ischemic features, initial troponin negative, and CXR without acute findings  - He has known diffuse CAD without clear focal lesion suitable for PCI on cath in July 2018  - Patient was treated with ASA 324 mg and started on IV heparin in ED  - Cardiology is consulting and much appreciated, will follow-up on recommendations  - Continue cardiac monitoring, check serial troponin measurements, repeat EKG, continue Plavix, statin, and beta-blocker   2. CKD stage III  - SCr is 1.54 on admission, similar to priors  - Renally-dose medications    3. Paroxysmal atrial fibrillation  - In sinus rhythm on admission  - CHADS-VASc 6 (age x2, CAD, CHF, HTN, DM)  - Taking Eliquis prior to admission, IV heparin infusion ordered from ED  - Continue  amiodarone   4. Chronic diastolic CHF  - Preserved EF on echo from January 2019  -  Appears compensated  - Continue Lasix and beta-blocker, follow daily wt and I/O's    5. Type II DM  - A1c was 6.3% last year  - Managed at home with metformin, held on admission  - Check CBG's and use a low-intensity SSI with Novolog while in hospital   6. Hypertension  - BP at goal  - Continue Lopressor as tolerated    7. Normocytic anemia  - Hgb is stable at 10.0 on admission with no active bleeding  - Likely secondary to chronic disease    8. CXR abnormality  - Prominent right paratracheal stripe noted on CXR with radiology recommending contrast-enhanced CT for r/o mass or adenopathy  - With renal insufficiency and unstable angina that may require cardiac cath this admission, will avoid IV contrast for now pending cardiology evaluation and plan     DVT prophylaxis: IV heparin, Eliquis pta  Code Status: Full  Family Communication: Wife updated at bedside Consults called: Cardiology  Admission status: Observation     Vianne Bulls, MD [facility] Pager [phone]  If 7PM-7AM, please contact night-coverage [email or web address] Password [email or web address]  07/26/2018, 2:15 AM

## 2018-07-26 NOTE — H&P (View-Only) (Signed)
Cardiology Consultation:   Patient ID: Gregory Bates; [id number]; 1941-02-01   Admit date: 07/25/2018 Date of Consult: 07/26/2018  Primary Care Provider: Street, Gregory Mt, MD Primary Cardiologist: Gregory Chandler, MD Primary Electrophysiologist:  None   Patient Profile:   Gregory Bates is a 77 y.o. male with a PMH of diffuse moderate CAD s/p STEMI in 2013 and NSTEMI 2014 w PCI to RCA and OM1, chronic diastolic CHF, paroxysmal atrial fibrillation on Eliquis, HTN, HLD, DM type 2, CKD stage 3, who is being seen today for the evaluation of chest pain at the request of Gregory Bates.  History of Present Illness:   Gregory Bates presents with complaints of intermittent chest pain over the past 2-3 days. He initially presented to the ED 07/24/18 with the same chest pain complaints, relieved with nitroglycerin, and was recommended for observation to rule out ACS, however the patient left AMA. Around 11pm last night he was awoken from sleep with another episode of chest pain which was left sided and non-radiating, again relieved with nitroglycerin prompting him to present to the ED again for evaluation. Neither episode was associated with diaphoresis, nausea, vomiting, SOB, lightheadedness, dizziness, or syncope.   He was last seen by Gregory Bates 01/2018 for routine follow-up and was felt to be doing okay from a cardiac standpoint. His last [facility] 05/2017 revealed moderate 3 vessel disease without clear focal lesion suitable for PCI. Per Gregory Bates who performed the catheterization, PCI of the PDA would be very challenging due to severe calcifications, tortuosity, and multiple prior stents in the RCA. Echo 11/2017 with preserved EF 55-60%, mild AI, and mild-moderate MR.   At the time of this evaluation he is chest pain free. He denies having difficulty with chest pain since his last [facility] 05/2017. He does report worsening DOE for the past month, stating that he gets winded with very little activity. Her  reports having an intermittent non-productive cough. He denies orthopnea, PND, LE edema, SOB at rest, or problems with bleeding. His last dose of eliquis was the night of 07/25/18.   Hospital course: VSS. Labs notable for electrolytes wnl, Cr 1.54 (baseline), Hgb 10.0 (baseline), PLT 187, Trop 0.03>0.04. CXR with prominence of R paratracheal stripe/ R suprahilar contour recommended for CT Chest w contrast to r/o adenopathy/lung mass; hyperinflated lungs suggesting COPD. EKG with sinus rhythm without STE/D, early repolarization, no TWI. He was admitted to medicine for unstable angina and started on a heparin gtt. Cardiology asked to evaluate for further recommendations.   Past Medical History:  Diagnosis Date  . Acute renal insufficiency    a. During 11/2013 adm with atrial flutter.  . Anemia, unspecified   . CAD (coronary artery disease), native coronary artery    a. s/p cath May 2011 >> DES distal RCA;  b. 01/2013 NSTEMI >> OM1 99p (2.75x12 Promus Premier DES);  c. LHC (2/15): PCI: Promus DES to dist RCA ;  d. LHC 1/16 - dLM 70, oLAD 30, small D1 tandem 72, pOM1 stent ok, mOM 50-60, dOM bifurcation 50-60 in both vessels, AV 78-70 jailed by stent, pRCA 72 mRCA stent ok, dRCA stent ok, PLB smal 50, dPDA 60, 80; EF 35-40 >> med Rx - PCI of PDA if angina  . Chronic combined systolic and diastolic CHF (congestive heart failure) ([place]) 12/23/2015   Echo 2/17: Mild LVH, EF 45-50%, inferior akinesis, grade 2 diastolic dysfunction, mild AI, mild MR, mild LAE, PASP 44 mmHg   . Colorectal polyps 2002  .  Diabetes mellitus    AODM  . Erectile dysfunction   . History of echocardiogram    a. 01/2013 Echo: EF 55%, mid-dist inflat HK, Gr1 DD, Triv AI/TR, Mild MR.;  b.  Echo (1/15): Mild LVH, EF 55%, inferolateral hypokinesis, grade 1 diastolic dysfunction, mild AI, trivial MR, moderate LAE, PASP 36 mmHg   . Hx of cardiovascular stress test    Stress myoview 08/07/13 with LVEF 44%, inferior scar, no ischemia  .  Hyperlipidemia   . Hypertension   . Ischemic cardiomyopathy    a. EF 35-40% by LHC in 1/16; b. Echo 2/17: Mild LVH, EF 45-50%, inferior akinesis, grade 2 diastolic dysfunction, mild AI, mild MR, mild LAE, PASP 44 mmHg  . Myocardial infarction ([place])   . PAF (paroxysmal atrial fibrillation) ([place])    a. Dx 11/2013. Spont conv. Placed on eliquis.    Past Surgical History:  Procedure Laterality Date  . COLONOSCOPY W/ POLYPECTOMY  2002   [facility]  . COLONOSCOPY W/ POLYPECTOMY  2004; 2007 negative  . CORONARY ANGIOPLASTY  12/26/2013  . CORONARY STENT PLACEMENT  2007, 2011  . infantile paralysis facial asymmetry    . LEFT HEART CATH AND CORONARY ANGIOGRAPHY N/A 05/18/2017   Procedure: Left Heart Cath and Coronary Angiography;  Surgeon: Bates, Gregory M, MD;  Location: [facility];  Service: Cardiovascular;  Laterality: N/A;  . LEFT HEART CATHETERIZATION WITH CORONARY ANGIOGRAM N/A 10/02/2012   Procedure: LEFT HEART CATHETERIZATION WITH CORONARY ANGIOGRAM;  Surgeon: Gregory Bates Martinique, MD;  Location: [facility] CATH LAB;  Service: Cardiovascular;  Laterality: N/A;  . LEFT HEART CATHETERIZATION WITH CORONARY ANGIOGRAM N/A 02/20/2013   Procedure: LEFT HEART CATHETERIZATION WITH CORONARY ANGIOGRAM;  Surgeon: Burnell Blanks, MD;  Location: [facility] CATH LAB;  Service: Cardiovascular;  Laterality: N/A;  . LEFT HEART CATHETERIZATION WITH CORONARY ANGIOGRAM N/A 12/26/2013   Procedure: LEFT HEART CATHETERIZATION WITH CORONARY ANGIOGRAM;  Surgeon: Burnell Blanks, MD;  Location: [facility] CATH LAB;  Service: Cardiovascular;  Laterality: N/A;  . LEFT HEART CATHETERIZATION WITH CORONARY ANGIOGRAM N/A 11/28/2014   Procedure: LEFT HEART CATHETERIZATION WITH CORONARY ANGIOGRAM;  Surgeon: Burnell Blanks, MD;  Location: [facility] CATH LAB;  Service: Cardiovascular;  Laterality: N/A;  . PERCUTANEOUS CORONARY STENT INTERVENTION (PCI-S)  10/02/2012   Procedure: PERCUTANEOUS CORONARY STENT INTERVENTION (PCI-S);  Surgeon:  Gregory Bates Martinique, MD;  Location: [facility] CATH LAB;  Service: Cardiovascular;;  . PERCUTANEOUS CORONARY STENT INTERVENTION (PCI-S)  02/20/2013   Procedure: PERCUTANEOUS CORONARY STENT INTERVENTION (PCI-S);  Surgeon: Burnell Blanks, MD;  Location: [facility] CATH LAB;  Service: Cardiovascular;;  . PERCUTANEOUS CORONARY STENT INTERVENTION (PCI-S)  12/26/2013   Procedure: PERCUTANEOUS CORONARY STENT INTERVENTION (PCI-S);  Surgeon: Burnell Blanks, MD;  Location: [facility] CATH LAB;  Service: Cardiovascular;;  . WISDOM TOOTH EXTRACTION       Home Medications:  Prior to Admission medications   Medication Sig Start Date End Date Taking? Authorizing Provider  amiodarone (PACERONE) 200 MG tablet Take 1 tablet (200 mg total) by mouth daily. 02/21/18  Yes Burnell Blanks, MD  amLODipine (NORVASC) 5 MG tablet TAKE 1 TABLET EVERY DAY Patient taking differently: Take 5 mg by mouth daily.  08/20/17  Yes Weaver, Scott T, PA-C  atorvastatin (LIPITOR) 20 MG tablet Take 1 tablet (20 mg total) by mouth daily. 11/19/17  Yes Burnell Blanks, MD  cholecalciferol (VITAMIN D) 1000 UNITS tablet Take 1,000 Units by mouth every evening.    Yes [provider]  clopidogrel (PLAVIX) 75  MG tablet TAKE 1 TABLET EVERY DAY WITH BREAKFAST Patient taking differently: Take 75 mg by mouth daily.  02/21/18  Yes McAlhany, Annita Brod, MD  ELIQUIS 5 MG TABS tablet TAKE 1 TABLET BY MOUTH TWICE DAILY Patient taking differently: Take 5 mg by mouth 2 (two) times daily.  11/24/17  Yes Burnell Blanks, MD  fexofenadine (ALLEGRA) 180 MG tablet Take 180 mg by mouth daily as needed for allergies.    Yes [provider]  furosemide (LASIX) 20 MG tablet Take 1 tablet (20 mg total) by mouth daily. 02/21/18  Yes Burnell Blanks, MD  isosorbide mononitrate (IMDUR) 60 MG 24 hr tablet Take 1 tablet (60 mg total) by mouth daily. 11/19/17  Yes Burnell Blanks, MD  lisinopril (PRINIVIL,ZESTRIL) 20 MG tablet  Take 1 tablet (20 mg total) by mouth daily. 02/21/18  Yes Burnell Blanks, MD  metFORMIN (GLUCOPHAGE) 500 MG tablet Take 500 mg by mouth daily.   Yes [provider]  metoprolol tartrate (LOPRESSOR) 25 MG tablet Take 1 tablet (25 mg total) by mouth 2 (two) times daily. 02/21/18  Yes Burnell Blanks, MD  Multiple Vitamin (MULTIVITAMIN) tablet Take 1 tablet by mouth at bedtime.    Yes [provider]  nitroGLYCERIN (NITROSTAT) 0.4 MG SL tablet Place 0.4 mg under the tongue every 5 (five) minutes as needed for chest pain.   Yes [provider]  vitamin E 100 UNIT capsule Take 100 Units by mouth at bedtime.    Yes [provider]    Inpatient Medications: Scheduled Meds: . amiodarone  200 mg Oral Daily  . atorvastatin  20 mg Oral q1800  . cholecalciferol  1,000 Units Oral QPM  . clopidogrel  75 mg Oral Daily  . furosemide  20 mg Oral Daily  . insulin aspart  0-9 Units Subcutaneous Q4H  . isosorbide mononitrate  60 mg Oral Daily  . lisinopril  20 mg Oral Daily  . metoprolol tartrate  25 mg Oral BID  . multivitamin with minerals  1 tablet Oral QHS   Continuous Infusions: . heparin 1,200 Units/hr (07/26/18 0618)   PRN Meds: acetaminophen, morphine injection, nitroGLYCERIN, ondansetron (ZOFRAN) IV  Allergies:    Allergies  Allergen Reactions  . Brilinta [Ticagrelor] Other (See Comments)    Arthralgias & myalgias  . Crestor [Rosuvastatin] Other (See Comments)    Myalgias    Social History:   Social History   Socioeconomic History  . Marital status: Married    Spouse name: Not on file  . Number of children: 1  . Years of education: Not on file  . Highest education level: Not on file  Occupational History  . Occupation: maintenance    Comment: full time  Social Needs  . Financial resource strain: Not hard at all  . Food insecurity:    Worry: Never true    Inability: Never true  . Transportation needs:    Medical: Yes     Non-medical: Yes  Tobacco Use  . Smoking status: Former Smoker    Last attempt to quit: 11/02/1984    Years since quitting: 33.7  . Smokeless tobacco: Never Used  . Tobacco comment: smoked Christopher, up to 3 ppd (!)  Substance and Sexual Activity  . Alcohol use: No  . Drug use: No  . Sexual activity: Not on file  Lifestyle  . Physical activity:    Days per week: Not on file    Minutes per session: Not on file  .  Stress: Not at all  Relationships  . Social connections:    Talks on phone: Not on file    Gets together: Not on file    Attends religious service: Not on file    Active member of club or organization: Not on file    Attends meetings of clubs or organizations: Not on file    Relationship status: Not on file  . Intimate partner violence:    Fear of current or ex partner: Not on file    Emotionally abused: Not on file    Physically abused: Not on file    Forced sexual activity: Not on file  Other Topics Concern  . Not on file  Social History Narrative   [employer] - does maintenance    Family History:    Family History  Problem Relation Age of Onset  . Heart attack Brother 75  . Colon cancer Brother   . Diabetes Neg Hx   . Stroke Neg Hx   . Hypertension Neg Hx      ROS:  Please see the history of present illness.   All other ROS reviewed and negative.     Physical Exam/Data:   Vitals:   07/26/18 0227 07/26/18 0310 07/26/18 0318 07/26/18 0329  BP: (!) 130/58 139/66  139/66  Pulse: 63 60  62  Resp: 16 16  18   Temp:  98.2 F (36.8 C)  98.2 F (36.8 C)  TempSrc:  Oral  Oral  SpO2: 100% 98%  98%  Weight:   85.5 kg   Height:   5\' 11"  (1.803 Bates)    No intake or output data in the 24 hours ending 07/26/18 0741 Filed Weights   07/26/18 0006 07/26/18 0318  Weight: 83.9 kg 85.5 kg   Body mass index is 26.29 kg/Bates.  General:  Well nourished, well developed, laying in bed in no acute distress HEENT: sclera anicteric  Neck: no JVD Vascular: No  carotid bruits; distal pulses 2+ bilaterally Cardiac:  normal S1, S2; RRR; no murmurs, rubs, or gallops Lungs:  clear to auscultation bilaterally, no wheezing or rales; scattered rhonchi/rubs.   Abd: NABS, soft, nontender, no hepatomegaly Ext: no edema Musculoskeletal:  No deformities, BUE and BLE strength normal and equal Skin: warm and dry  Neuro:  CNs 2-12 intact, no focal abnormalities noted Psych:  Normal affect   EKG:  The EKG was personally reviewed and demonstrates:  sinus rhythm without STE/D, early repolarization, no TWI.  Telemetry:  Telemetry was personally reviewed and demonstrates:  NSR  Relevant CV Studies: Left heart catheterization 05/2017: 1. 3 vessel obstructive CAD    - 40% distal left main    - 50% ostial LAD. The LAD is relatively small.    - diffuse 70% mid first diagonal. This is a large branch    - 75% mid OM 1. Stent in the proximal vessel is still patent    - 80% distal LCx after OM1    - 60% proximal RCA, stents in the mid and distal RCA are patent    - 90% PDA 2. Moderate LV dysfunction with inferior HK. EF 40-45% 3. Normal LVEDP  Plan: compared to prior cath in 2016 there is no significant change. He has 3 vessel diffuse disease without clear focal lesion suitable for PCI. PCI of the PDA would be very challenging due to severe calcification, tortuosity and multiple prior stents in RCA. Will discuss with Gregory Bates.  Laboratory Data:  Chemistry Recent Labs  Lab 07/24/18 1118 07/26/18 0055  NA 133* 135  K 4.3 4.8  CL 102 102  CO2 22 23  GLUCOSE 200* 128*  BUN 18 16  CREATININE 1.61* 1.54*  CALCIUM 9.0 9.4  GFRNONAA 40* 42*  GFRAA 46* 48*  ANIONGAP 9 10    No results for input(s): PROT, ALBUMIN, AST, ALT, ALKPHOS, BILITOT in the last 168 hours. Hematology Recent Labs  Lab 07/24/18 1118 07/26/18 0055  WBC 10.1 7.2  RBC 3.45* 3.32*  HGB 10.4* 10.0*  HCT 32.1* 31.2*  MCV 93.0 94.0  MCH 30.1 30.1  MCHC 32.4 32.1  RDW 13.2 13.2  PLT  183 187   Cardiac Enzymes Recent Labs  Lab 07/26/18 0228  TROPONINI 0.04*    Recent Labs  Lab 07/24/18 1127 07/26/18 0057  TROPIPOC 0.00 0.03    BNP Recent Labs  Lab 07/24/18 1118  BNP 121.9*    DDimer No results for input(s): DDIMER in the last 168 hours.  Radiology/Studies:  Dg Chest 2 View  Result Date: 07/26/2018 CLINICAL DATA:  Chest pain EXAM: CHEST - 2 VIEW COMPARISON:  07/24/2018 chest radiograph. FINDINGS: Normal heart size. Prominence of the right paratracheal stripe/right superior hilar contour. Aortic atherosclerosis. No pneumothorax. No pleural effusion. Hyperinflated lungs. IMPRESSION: Prominence of the right paratracheal stripe/right suprahilar contour. Recommend chest CT with IV contrast to exclude adenopathy/lung mass. Hyperinflated lungs, suggesting COPD. Electronically Signed   By: Ilona Sorrel Bates.D.   On: 07/26/2018 01:14   Dg Chest 2 View  Result Date: 07/24/2018 CLINICAL DATA:  Pt reports cough x yesterday and left sided chest pain for over 1 hour; Hx of MI w/ 5 stents, DM, and HTN; former smoker EXAM: CHEST - 2 VIEW COMPARISON:  07/19/2018 FINDINGS: Cardiac silhouette is normal in size and configuration no mediastinal or hilar masses. No evidence of adenopathy. Clear lungs.  No pleural effusion or pneumothorax. Skeletal structures are demineralized but intact. IMPRESSION: No active cardiopulmonary disease. Electronically Signed   By: Lajean Manes Bates.D.   On: 07/24/2018 12:17    Assessment and Plan:   1. Unstable angina in patient with moderate multivessel CAD with PCI/DES to RCA and OM1: patient presented with 2-3 day history of chest pain relieved with nitroglycerin c/f unstable angina. Trop 0.03>0.04. EKG non-ischemic. CXR without pulmonary edema. Last LHC in 05/2017 with diffuse moderate disease without focal lesion suitable for PCI. Echo 11/2017 with EF 55-60%. Last dose of apixaban was in the evening of 07/25/18. Would benefit from further ischemic evaluation   - Will plan for [facility] tomorrow to further evaluate chest pain - Continue to hold apixaban - Can continue heparin gtt for now - Continue plavix, statin, imdur, and BBlocker - adjustment to medications pending LHC.   2. Chronic diastolic CHF: no volume overload complaints. CXR without pulmonary edema. BNP from 07/24/18 121. He appears euvolemic on exam.  - Continue BBlocker  - Will hold lisinopril and lasix tomorrow  3. Paroxysmal atrial fibrillation: maintaining sinus rhythm this admission. On amiodarone and metoprolol for rate/rhythm control. On eliquis for CHA2DS2-VASc Score of 6 (CHF, HTN, DM, Vascular, and Age >75). Hgb stable and no complaints of bleeding - Continue amiodarone and metoprolol - Continue to hold home apixaban in anticipation of LHC tomorrow  4. HTN: BP mildly elevated but near goal - Continue lisinopril, metoprolol, lasix, and imdur for now - Will hold lisinopril and lasix tomorrow  5. HLD: no recent lipids on file  - Will check a FLP in AM -  Continue statin  6. DM type 2: A1C 6.3 10/2017; at goal of <7 - Continue management per primary team  7. Abnormal CXR: recommended for CT chest with contrast to further evaluate for adenopathy/mass. Appreciate primary team delaying study in the event of further ischemic evaluation needs. - Will need CT Chest per primary team after ischemic evaluation.  8. CKD stage 3: Cr 1.54 today (baseline) - Continue to monitor closely - Will hold lisinopril and lasix tomorrow    For questions or updates, please contact [facility] Please consult [email or web address] for contact info under Cardiology/STEMI.   Signed, Abigail Butts, PA-C  07/26/2018 7:41 AM [phone]  Personally seen and examined. Agree with above.  77 year old with known diffuse coronary artery disease on Eliquis for paroxysmal atrial fibrillation with diabetes here with unstable angina.  Currently chest pain-free in bed.  No shortness of breath  GEN: Well  nourished, well developed, in no acute distress  HEENT: normal  Neck: no JVD, carotid bruits, or masses Cardiac: RRR; no murmurs, rubs, or gallops,no edema  Respiratory:  clear to auscultation bilaterally, normal work of breathing GI: soft, nontender, nondistended, + BS MS: no deformity or atrophy  Skin: warm and dry, no rash Neuro:  Alert and Oriented x 3, Strength and sensation are intact Psych: euthymic mood, full affect  EKG personally reviewed and interpreted shows sinus rhythm, subtle early repolarization changes.  Telemetry also personally reviewed shows normal sinus rhythm.  Cardiac catheterization 05/2017 demonstrated three-vessel coronary artery disease with diffusely calcified PDA, 40% distal left main, 50% ostial LAD There was no significant change from prior cath in 2016.  Assessment and plan:  Unstable angina - We will go ahead and get him set up for cardiac catheterization tomorrow.  He will hold his Eliquis.  Last dose 07/25/2018 evening.  Risks and benefits have been described including stroke heart attack death renal impairment bleeding.  He is willing to proceed.  If there has been any advancement in his coronary artery disease especially distal left main disease, we may need to consider CABG.  Continue isosorbide, metoprolol.  Chronic diastolic heart failure - Doing well.  Holding lisinopril and Lasix for cath.  Paroxysmal atrial fibrillation - CHADSVASc 6, holding Eliquis. -Continuing with amiodarone and metoprolol.  Diabetes with hypertension and hyperlipidemia - No changes.  A1c excellent at 6.3.  Continue statin therapy  Candee Furbish, MD

## 2018-07-26 NOTE — ED Triage Notes (Signed)
Patient arrived with EMS from home woke up this evening with left chest pain with mild SOB and productive cough , he took his NTG sl x2 with relief , denies chest pain at arrival .

## 2018-07-26 NOTE — Progress Notes (Signed)
ANTICOAGULATION CONSULT NOTE - Initial Consult  Pharmacy Consult for heparin Indication: atrial fibrillation  Allergies  Allergen Reactions  . Brilinta [Ticagrelor] Other (See Comments)    Arthralgias & myalgias  . Crestor [Rosuvastatin] Other (See Comments)    Myalgias    Patient Measurements: Height: 5\' 11"  (180.3 cm) Weight: 187 lb 6.3 oz (85 kg) IBW/kg (Calculated) : 75.3  Vital Signs: Temp: 98.8 F (37.1 C) (09/24 1140) Temp Source: Oral (09/24 1140) BP: 129/64 (09/24 1140) Pulse Rate: 69 (09/24 1140)  Labs: Recent Labs    07/24/18 1118 07/26/18 0055 07/26/18 0228 07/26/18 0804 07/26/18 1401  HGB 10.4* 10.0*  --   --   --   HCT 32.1* 31.2*  --   --   --   PLT 183 187  --   --   --   APTT  --   --   --   --  125*  LABPROT  --  16.2*  --   --   --   INR  --  1.31  --   --   --   HEPARINUNFRC  --   --   --   --  >2.20*  CREATININE 1.61* 1.54*  --  1.43*  --   TROPONINI  --   --  0.04* 0.03* <0.03    Estimated Creatinine Clearance: 46.1 mL/min (A) (by C-G formula based on SCr of 1.43 mg/dL (H)).   Medical History: Past Medical History:  Diagnosis Date  . Acute renal insufficiency    a. During 11/2013 adm with atrial flutter.  . Anemia, unspecified   . CAD (coronary artery disease), native coronary artery    a. s/p cath May 2011 >> DES distal RCA;  b. 01/2013 NSTEMI >> OM1 99p (2.75x12 Promus Premier DES);  c. LHC (2/15): PCI: Promus DES to dist RCA ;  d. LHC 1/16 - dLM 30, oLAD 30, small D1 tandem 60, pOM1 stent ok, mOM 50-60, dOM bifurcation 50-60 in both vessels, AV 60-70 jailed by stent, pRCA 61 mRCA stent ok, dRCA stent ok, PLB smal 50, dPDA 60, 80; EF 35-40 >> med Rx - PCI of PDA if angina  . Chronic combined systolic and diastolic CHF (congestive heart failure) ([place]) 12/23/2015   Echo 2/17: Mild LVH, EF 45-50%, inferior akinesis, grade 2 diastolic dysfunction, mild AI, mild MR, mild LAE, PASP 44 mmHg   . Colorectal polyps 2002  . Diabetes mellitus     AODM  . Erectile dysfunction   . History of echocardiogram    a. 01/2013 Echo: EF 55%, mid-dist inflat HK, Gr1 DD, Triv AI/TR, Mild MR.;  b.  Echo (1/15): Mild LVH, EF 55%, inferolateral hypokinesis, grade 1 diastolic dysfunction, mild AI, trivial MR, moderate LAE, PASP 36 mmHg   . Hx of cardiovascular stress test    Stress myoview 08/07/13 with LVEF 44%, inferior scar, no ischemia  . Hyperlipidemia   . Hypertension   . Ischemic cardiomyopathy    a. EF 35-40% by LHC in 1/16; b. Echo 2/17: Mild LVH, EF 45-50%, inferior akinesis, grade 2 diastolic dysfunction, mild AI, mild MR, mild LAE, PASP 44 mmHg  . Myocardial infarction ([place])   . PAF (paroxysmal atrial fibrillation) ([place])    a. Dx 11/2013. Spont conv. Placed on eliquis.    Medications:  Medications Prior to Admission  Medication Sig Dispense Refill Last Dose  . amiodarone (PACERONE) 200 MG tablet Take 1 tablet (200 mg total) by mouth daily. 90 tablet 3 07/25/2018 at  Unknown time  . amLODipine (NORVASC) 5 MG tablet TAKE 1 TABLET EVERY DAY (Patient taking differently: Take 5 mg by mouth daily. ) 90 tablet 3 07/25/2018 at Unknown time  . atorvastatin (LIPITOR) 20 MG tablet Take 1 tablet (20 mg total) by mouth daily. 90 tablet 3 07/25/2018 at Unknown time  . cholecalciferol (VITAMIN D) 1000 UNITS tablet Take 1,000 Units by mouth every evening.    07/25/2018 at Unknown time  . clopidogrel (PLAVIX) 75 MG tablet TAKE 1 TABLET EVERY DAY WITH BREAKFAST (Patient taking differently: Take 75 mg by mouth daily. ) 90 tablet 3 07/25/2018 at Unknown time  . ELIQUIS 5 MG TABS tablet TAKE 1 TABLET BY MOUTH TWICE DAILY (Patient taking differently: Take 5 mg by mouth 2 (two) times daily. ) 180 tablet 3 07/25/2018 at 1800  . fexofenadine (ALLEGRA) 180 MG tablet Take 180 mg by mouth daily as needed for allergies.    07/25/2018 at Unknown time  . furosemide (LASIX) 20 MG tablet Take 1 tablet (20 mg total) by mouth daily. 90 tablet 3 07/25/2018 at Unknown time  .  isosorbide mononitrate (IMDUR) 60 MG 24 hr tablet Take 1 tablet (60 mg total) by mouth daily. 90 tablet 3 07/25/2018 at Unknown time  . lisinopril (PRINIVIL,ZESTRIL) 20 MG tablet Take 1 tablet (20 mg total) by mouth daily. 90 tablet 3 07/25/2018 at Unknown time  . metFORMIN (GLUCOPHAGE) 500 MG tablet Take 500 mg by mouth daily.   07/25/2018 at Unknown time  . metoprolol tartrate (LOPRESSOR) 25 MG tablet Take 1 tablet (25 mg total) by mouth 2 (two) times daily. 180 tablet 3 07/25/2018 at 1800  . Multiple Vitamin (MULTIVITAMIN) tablet Take 1 tablet by mouth at bedtime.    07/25/2018 at Unknown time  . nitroGLYCERIN (NITROSTAT) 0.4 MG SL tablet Place 0.4 mg under the tongue every 5 (five) minutes as needed for chest pain.   07/25/2018 at Unknown time  . vitamin E 100 UNIT capsule Take 100 Units by mouth at bedtime.    07/25/2018 at Unknown time   Scheduled:  . amiodarone  200 mg Oral Daily  . [START ON 07/27/2018] aspirin  81 mg Oral Pre-Cath  . atorvastatin  20 mg Oral q1800  . chlorpheniramine-HYDROcodone  5 mL Oral Q12H  . cholecalciferol  1,000 Units Oral QPM  . clopidogrel  75 mg Oral Daily  . insulin aspart  0-9 Units Subcutaneous Q4H  . isosorbide mononitrate  60 mg Oral Daily  . metoprolol tartrate  25 mg Oral BID  . multivitamin with minerals  1 tablet Oral QHS  . sodium chloride flush  3 mL Intravenous Q12H    Assessment: 77yo male c/o CP w/ mild SOB and productive cough, took NTG x2 at home w/ relief, admitted for further w/u, to transition from Eliquis (for Afib) to UFH; last dose of Eliquis taken PTA 9/23 at 1800.  First aPTT elevated at 125 and heparin level > 2.2 which is expected with Eliquis on board.  Goal of Therapy:  Heparin level 0.3-0.7 units/ml aPTT 66-102 seconds Monitor platelets by anticoagulation protocol: Yes   Plan:  Decrease heparin gtt to 950 units/hr Check aPTT in 6 hours Monitor daily aPTT / heparin level, CBC, s/s of bleed  Elenor Quinones, PharmD,  BCPS Clinical Pharmacist Phone number [phone] 07/26/2018 3:10 PM

## 2018-07-26 NOTE — Progress Notes (Signed)
[place] for heparin Indication: atrial fibrillation  Allergies  Allergen Reactions  . Brilinta [Ticagrelor] Other (See Comments)    Arthralgias & myalgias  . Crestor [Rosuvastatin] Other (See Comments)    Myalgias    Patient Measurements: Height: 5\' 11"  (180.3 cm) Weight: 187 lb 6.3 oz (85 kg) IBW/kg (Calculated) : 75.3  Vital Signs: Temp: 98.2 F (36.8 C) (09/24 1950) Temp Source: Oral (09/24 1950) BP: 117/62 (09/24 1950) Pulse Rate: 65 (09/24 1950)  Labs: Recent Labs    07/24/18 1118 07/26/18 0055 07/26/18 0228 07/26/18 0804 07/26/18 1401 07/26/18 2125  HGB 10.4* 10.0*  --   --   --   --   HCT 32.1* 31.2*  --   --   --   --   PLT 183 187  --   --   --   --   APTT  --   --   --   --  125* 107*  LABPROT  --  16.2*  --   --   --   --   INR  --  1.31  --   --   --   --   HEPARINUNFRC  --   --   --   --  >2.20*  --   CREATININE 1.61* 1.54*  --  1.43*  --   --   TROPONINI  --   --  0.04* 0.03* <0.03  --     Estimated Creatinine Clearance: 46.1 mL/min (A) (by C-G formula based on SCr of 1.43 mg/dL (H)).   Medical History: Past Medical History:  Diagnosis Date  . Acute renal insufficiency    a. During 11/2013 adm with atrial flutter.  . Anemia, unspecified   . CAD (coronary artery disease), native coronary artery    a. s/p cath May 2011 >> DES distal RCA;  b. 01/2013 NSTEMI >> OM1 99p (2.75x12 Promus Premier DES);  c. LHC (2/15): PCI: Promus DES to dist RCA ;  d. LHC 1/16 - dLM 109, oLAD 30, small D1 tandem 69, pOM1 stent ok, mOM 50-60, dOM bifurcation 50-60 in both vessels, AV 40-70 jailed by stent, pRCA 40 mRCA stent ok, dRCA stent ok, PLB smal 50, dPDA 60, 80; EF 35-40 >> med Rx - PCI of PDA if angina  . Chronic combined systolic and diastolic CHF (congestive heart failure) ([place]) 12/23/2015   Echo 2/17: Mild LVH, EF 45-50%, inferior akinesis, grade 2 diastolic dysfunction, mild AI, mild MR, mild LAE, PASP 44 mmHg   .  Colorectal polyps 2002  . Diabetes mellitus    AODM  . Erectile dysfunction   . History of echocardiogram    a. 01/2013 Echo: EF 55%, mid-dist inflat HK, Gr1 DD, Triv AI/TR, Mild MR.;  b.  Echo (1/15): Mild LVH, EF 55%, inferolateral hypokinesis, grade 1 diastolic dysfunction, mild AI, trivial MR, moderate LAE, PASP 36 mmHg   . Hx of cardiovascular stress test    Stress myoview 08/07/13 with LVEF 44%, inferior scar, no ischemia  . Hyperlipidemia   . Hypertension   . Ischemic cardiomyopathy    a. EF 35-40% by LHC in 1/16; b. Echo 2/17: Mild LVH, EF 45-50%, inferior akinesis, grade 2 diastolic dysfunction, mild AI, mild MR, mild LAE, PASP 44 mmHg  . Myocardial infarction ([place])   . PAF (paroxysmal atrial fibrillation) ([place])    a. Dx 11/2013. Spont conv. Placed on eliquis.     Assessment: 77yo male c/o CP w/ mild SOB and  productive cough, took NTG x2 at home w/ relief, admitted for further w/u, to transition from Eliquis (for Afib) to UFH; last dose of Eliquis taken PTA 9/23 at 1800.  Follow up aPTT just above goal at 107s and heparin level > 2.2 which is expected with Eliquis on board. Will decrease heparin rate slightly and recheck in am.   Goal of Therapy:  Heparin level 0.3-0.7 units/ml aPTT 66-102 seconds Monitor platelets by anticoagulation protocol: Yes   Plan:  Decrease heparin gtt to 850 units/hr Check aPTT in 8 hours Monitor daily aPTT / heparin level, CBC, s/s of bleed  Erin Hearing PharmD., BCPS Clinical Pharmacist 07/26/2018 10:05 PM

## 2018-07-26 NOTE — Progress Notes (Addendum)
Patient presented with remittent chest pain over the past 2 to 3 days.  Relieved with nitroglycerin.  And was to place patient in observation 2 days ago when he initially presented but he left feeling he could manage at home.  11 PM last night he will wake with pain again.  Resented back to the emergency department and is placed in observation.  Cardiac saltation has been obtained who recommends cardiac catheterization in a.m.  We will continue to monitor.  Patient will stay in observation status.  Patient with viral URI.  Lungs are clear after coughing.  He is concerned that his cough may prevent him from having his cardiac catheterization.  I am going to give him a dose of Tussionex.

## 2018-07-26 NOTE — Progress Notes (Signed)
ANTICOAGULATION CONSULT NOTE - Initial Consult  Pharmacy Consult for heparin Indication: atrial fibrillation  Allergies  Allergen Reactions  . Brilinta [Ticagrelor] Other (See Comments)    Arthralgias & myalgias  . Crestor [Rosuvastatin] Other (See Comments)    Myalgias    Patient Measurements: Height: 5\' 11"  (180.3 cm) Weight: 188 lb 7.9 oz (85.5 kg) IBW/kg (Calculated) : 75.3  Vital Signs: Temp: 98.2 F (36.8 C) (09/24 0329) Temp Source: Oral (09/24 0329) BP: 139/66 (09/24 0329) Pulse Rate: 62 (09/24 0329)  Labs: Recent Labs    07/24/18 1118 07/26/18 0055 07/26/18 0228  HGB 10.4* 10.0*  --   HCT 32.1* 31.2*  --   PLT 183 187  --   LABPROT  --  16.2*  --   INR  --  1.31  --   CREATININE 1.61* 1.54*  --   TROPONINI  --   --  0.04*    Estimated Creatinine Clearance: 42.8 mL/min (A) (by C-G formula based on SCr of 1.54 mg/dL (H)).   Medical History: Past Medical History:  Diagnosis Date  . Acute renal insufficiency    a. During 11/2013 adm with atrial flutter.  . Anemia, unspecified   . CAD (coronary artery disease), native coronary artery    a. s/p cath May 2011 >> DES distal RCA;  b. 01/2013 NSTEMI >> OM1 99p (2.75x12 Promus Premier DES);  c. LHC (2/15): PCI: Promus DES to dist RCA ;  d. LHC 1/16 - dLM 68, oLAD 30, small D1 tandem 66, pOM1 stent ok, mOM 50-60, dOM bifurcation 50-60 in both vessels, AV 27-70 jailed by stent, pRCA 3 mRCA stent ok, dRCA stent ok, PLB smal 50, dPDA 60, 80; EF 35-40 >> med Rx - PCI of PDA if angina  . Chronic combined systolic and diastolic CHF (congestive heart failure) ([place]) 12/23/2015   Echo 2/17: Mild LVH, EF 45-50%, inferior akinesis, grade 2 diastolic dysfunction, mild AI, mild MR, mild LAE, PASP 44 mmHg   . Colorectal polyps 2002  . Diabetes mellitus    AODM  . Erectile dysfunction   . History of echocardiogram    a. 01/2013 Echo: EF 55%, mid-dist inflat HK, Gr1 DD, Triv AI/TR, Mild MR.;  b.  Echo (1/15): Mild LVH, EF 55%,  inferolateral hypokinesis, grade 1 diastolic dysfunction, mild AI, trivial MR, moderate LAE, PASP 36 mmHg   . Hx of cardiovascular stress test    Stress myoview 08/07/13 with LVEF 44%, inferior scar, no ischemia  . Hyperlipidemia   . Hypertension   . Ischemic cardiomyopathy    a. EF 35-40% by LHC in 1/16; b. Echo 2/17: Mild LVH, EF 45-50%, inferior akinesis, grade 2 diastolic dysfunction, mild AI, mild MR, mild LAE, PASP 44 mmHg  . Myocardial infarction ([place])   . PAF (paroxysmal atrial fibrillation) ([place])    a. Dx 11/2013. Spont conv. Placed on eliquis.    Medications:  Medications Prior to Admission  Medication Sig Dispense Refill Last Dose  . amiodarone (PACERONE) 200 MG tablet Take 1 tablet (200 mg total) by mouth daily. 90 tablet 3 07/25/2018 at Unknown time  . amLODipine (NORVASC) 5 MG tablet TAKE 1 TABLET EVERY DAY (Patient taking differently: Take 5 mg by mouth daily. ) 90 tablet 3 07/25/2018 at Unknown time  . atorvastatin (LIPITOR) 20 MG tablet Take 1 tablet (20 mg total) by mouth daily. 90 tablet 3 07/25/2018 at Unknown time  . cholecalciferol (VITAMIN D) 1000 UNITS tablet Take 1,000 Units by mouth every evening.  07/25/2018 at Unknown time  . clopidogrel (PLAVIX) 75 MG tablet TAKE 1 TABLET EVERY DAY WITH BREAKFAST (Patient taking differently: Take 75 mg by mouth daily. ) 90 tablet 3 07/25/2018 at Unknown time  . ELIQUIS 5 MG TABS tablet TAKE 1 TABLET BY MOUTH TWICE DAILY (Patient taking differently: Take 5 mg by mouth 2 (two) times daily. ) 180 tablet 3 07/25/2018 at 1800  . fexofenadine (ALLEGRA) 180 MG tablet Take 180 mg by mouth daily as needed for allergies.    07/25/2018 at Unknown time  . furosemide (LASIX) 20 MG tablet Take 1 tablet (20 mg total) by mouth daily. 90 tablet 3 07/25/2018 at Unknown time  . isosorbide mononitrate (IMDUR) 60 MG 24 hr tablet Take 1 tablet (60 mg total) by mouth daily. 90 tablet 3 07/25/2018 at Unknown time  . lisinopril (PRINIVIL,ZESTRIL) 20 MG tablet  Take 1 tablet (20 mg total) by mouth daily. 90 tablet 3 07/25/2018 at Unknown time  . metFORMIN (GLUCOPHAGE) 500 MG tablet Take 500 mg by mouth daily.   07/25/2018 at Unknown time  . metoprolol tartrate (LOPRESSOR) 25 MG tablet Take 1 tablet (25 mg total) by mouth 2 (two) times daily. 180 tablet 3 07/25/2018 at 1800  . Multiple Vitamin (MULTIVITAMIN) tablet Take 1 tablet by mouth at bedtime.    07/25/2018 at Unknown time  . nitroGLYCERIN (NITROSTAT) 0.4 MG SL tablet Place 0.4 mg under the tongue every 5 (five) minutes as needed for chest pain.   07/25/2018 at Unknown time  . vitamin E 100 UNIT capsule Take 100 Units by mouth at bedtime.    07/25/2018 at Unknown time   Scheduled:  . amiodarone  200 mg Oral Daily  . atorvastatin  20 mg Oral q1800  . cholecalciferol  1,000 Units Oral QPM  . clopidogrel  75 mg Oral Daily  . furosemide  20 mg Oral Daily  . insulin aspart  0-9 Units Subcutaneous Q4H  . isosorbide mononitrate  60 mg Oral Daily  . lisinopril  20 mg Oral Daily  . metoprolol tartrate  25 mg Oral BID  . multivitamin with minerals  1 tablet Oral QHS     Assessment: 77yo male c/o CP w/ mild SOB and productive cough, took NTG x2 at home w/ relief, admitted for further w/u, to transition from Eliquis (for Afib) to UFH; last dose of Eliquis taken PTA 9/23 at 1800.  Goal of Therapy:  Heparin level 0.3-0.7 units/ml aPTT 66-102 seconds Monitor platelets by anticoagulation protocol: Yes   Plan:  Will start heparin gtt at 0600 at 1200 units/hr and monitor heparin levels, aPTT (while Eliquis affects anti-Xa), and CBC.  Wynona Neat, PharmD, BCPS  07/26/2018,5:22 AM

## 2018-07-27 ENCOUNTER — Encounter (HOSPITAL_COMMUNITY)
Admission: EM | Disposition: A | Discharge status: Home / Self Care | Payer: Self-pay | Source: Home / Self Care | Attending: Cardiothoracic Surgery

## 2018-07-27 ENCOUNTER — Inpatient Hospital Stay (HOSPITAL_COMMUNITY): Payer: BLUE CROSS/BLUE SHIELD

## 2018-07-27 ENCOUNTER — Observation Stay (HOSPITAL_COMMUNITY): Payer: BLUE CROSS/BLUE SHIELD

## 2018-07-27 ENCOUNTER — Other Ambulatory Visit: Payer: Self-pay | Admitting: Physician Assistant

## 2018-07-27 ENCOUNTER — Other Ambulatory Visit: Payer: Self-pay | Admitting: *Deleted

## 2018-07-27 DIAGNOSIS — Z01810 Encounter for preprocedural cardiovascular examination: Secondary | ICD-10-CM

## 2018-07-27 DIAGNOSIS — I340 Nonrheumatic mitral (valve) insufficiency: Secondary | ICD-10-CM | POA: Diagnosis present

## 2018-07-27 DIAGNOSIS — R918 Other nonspecific abnormal finding of lung field: Secondary | ICD-10-CM | POA: Diagnosis not present

## 2018-07-27 DIAGNOSIS — I200 Unstable angina: Secondary | ICD-10-CM | POA: Diagnosis present

## 2018-07-27 DIAGNOSIS — I25110 Atherosclerotic heart disease of native coronary artery with unstable angina pectoris: Secondary | ICD-10-CM | POA: Diagnosis present

## 2018-07-27 DIAGNOSIS — E1122 Type 2 diabetes mellitus with diabetic chronic kidney disease: Secondary | ICD-10-CM | POA: Diagnosis present

## 2018-07-27 DIAGNOSIS — R9389 Abnormal findings on diagnostic imaging of other specified body structures: Secondary | ICD-10-CM | POA: Diagnosis not present

## 2018-07-27 DIAGNOSIS — Z7902 Long term (current) use of antithrombotics/antiplatelets: Secondary | ICD-10-CM | POA: Diagnosis not present

## 2018-07-27 DIAGNOSIS — E785 Hyperlipidemia, unspecified: Secondary | ICD-10-CM | POA: Diagnosis present

## 2018-07-27 DIAGNOSIS — Z8249 Family history of ischemic heart disease and other diseases of the circulatory system: Secondary | ICD-10-CM | POA: Diagnosis not present

## 2018-07-27 DIAGNOSIS — I5042 Chronic combined systolic (congestive) and diastolic (congestive) heart failure: Secondary | ICD-10-CM | POA: Diagnosis present

## 2018-07-27 DIAGNOSIS — I5022 Chronic systolic (congestive) heart failure: Secondary | ICD-10-CM

## 2018-07-27 DIAGNOSIS — Z955 Presence of coronary angioplasty implant and graft: Secondary | ICD-10-CM | POA: Diagnosis not present

## 2018-07-27 DIAGNOSIS — I2510 Atherosclerotic heart disease of native coronary artery without angina pectoris: Secondary | ICD-10-CM | POA: Diagnosis not present

## 2018-07-27 DIAGNOSIS — I255 Ischemic cardiomyopathy: Secondary | ICD-10-CM | POA: Diagnosis present

## 2018-07-27 DIAGNOSIS — D320 Benign neoplasm of cerebral meninges: Secondary | ICD-10-CM | POA: Diagnosis present

## 2018-07-27 DIAGNOSIS — I10 Essential (primary) hypertension: Secondary | ICD-10-CM | POA: Diagnosis not present

## 2018-07-27 DIAGNOSIS — I5032 Chronic diastolic (congestive) heart failure: Secondary | ICD-10-CM | POA: Diagnosis not present

## 2018-07-27 DIAGNOSIS — E222 Syndrome of inappropriate secretion of antidiuretic hormone: Secondary | ICD-10-CM | POA: Diagnosis present

## 2018-07-27 DIAGNOSIS — N289 Disorder of kidney and ureter, unspecified: Secondary | ICD-10-CM | POA: Diagnosis not present

## 2018-07-27 DIAGNOSIS — C3411 Malignant neoplasm of upper lobe, right bronchus or lung: Secondary | ICD-10-CM | POA: Diagnosis present

## 2018-07-27 DIAGNOSIS — I130 Hypertensive heart and chronic kidney disease with heart failure and stage 1 through stage 4 chronic kidney disease, or unspecified chronic kidney disease: Secondary | ICD-10-CM | POA: Diagnosis present

## 2018-07-27 DIAGNOSIS — Z800 Family history of malignant neoplasm of digestive organs: Secondary | ICD-10-CM | POA: Diagnosis not present

## 2018-07-27 DIAGNOSIS — I252 Old myocardial infarction: Secondary | ICD-10-CM | POA: Diagnosis not present

## 2018-07-27 DIAGNOSIS — Z87891 Personal history of nicotine dependence: Secondary | ICD-10-CM | POA: Diagnosis not present

## 2018-07-27 DIAGNOSIS — J019 Acute sinusitis, unspecified: Secondary | ICD-10-CM | POA: Diagnosis present

## 2018-07-27 DIAGNOSIS — Z7901 Long term (current) use of anticoagulants: Secondary | ICD-10-CM | POA: Diagnosis not present

## 2018-07-27 DIAGNOSIS — C771 Secondary and unspecified malignant neoplasm of intrathoracic lymph nodes: Secondary | ICD-10-CM | POA: Diagnosis present

## 2018-07-27 DIAGNOSIS — N183 Chronic kidney disease, stage 3 (moderate): Secondary | ICD-10-CM | POA: Diagnosis present

## 2018-07-27 DIAGNOSIS — D62 Acute posthemorrhagic anemia: Secondary | ICD-10-CM | POA: Diagnosis not present

## 2018-07-27 DIAGNOSIS — I480 Paroxysmal atrial fibrillation: Secondary | ICD-10-CM | POA: Diagnosis present

## 2018-07-27 HISTORY — PX: LEFT HEART CATH AND CORONARY ANGIOGRAPHY: CATH118249

## 2018-07-27 LAB — CBC
HCT: 32.8 % — ABNORMAL LOW (ref 39.0–52.0)
HEMOGLOBIN: 10.5 g/dL — AB (ref 13.0–17.0)
MCH: 29.5 pg (ref 26.0–34.0)
MCHC: 32 g/dL (ref 30.0–36.0)
MCV: 92.1 fL (ref 78.0–100.0)
PLATELETS: 206 10*3/uL (ref 150–400)
RBC: 3.56 MIL/uL — AB (ref 4.22–5.81)
RDW: 13 % (ref 11.5–15.5)
WBC: 7 10*3/uL (ref 4.0–10.5)

## 2018-07-27 LAB — BASIC METABOLIC PANEL
ANION GAP: 10 (ref 5–15)
BUN: 15 mg/dL (ref 8–23)
CO2: 25 mmol/L (ref 22–32)
Calcium: 9 mg/dL (ref 8.9–10.3)
Chloride: 94 mmol/L — ABNORMAL LOW (ref 98–111)
Creatinine, Ser: 1.43 mg/dL — ABNORMAL HIGH (ref 0.61–1.24)
GFR calc Af Amer: 53 mL/min — ABNORMAL LOW (ref >60)
GFR calc non Af Amer: 46 mL/min — ABNORMAL LOW (ref >60)
GLUCOSE: 96 mg/dL (ref 70–99)
Potassium: 4.2 mmol/L (ref 3.5–5.1)
Sodium: 129 mmol/L — ABNORMAL LOW (ref 135–145)

## 2018-07-27 LAB — GLUCOSE, CAPILLARY
GLUCOSE-CAPILLARY: 83 mg/dL (ref 70–99)
Glucose-Capillary: 92 mg/dL (ref 70–99)
Glucose-Capillary: 87 mg/dL (ref 70–99)
Glucose-Capillary: 86 mg/dL (ref 70–99)
Glucose-Capillary: 121 mg/dL — ABNORMAL HIGH (ref 70–99)

## 2018-07-27 LAB — APTT: APTT: 75 s — AB (ref 24–36)

## 2018-07-27 LAB — POCT ACTIVATED CLOTTING TIME: Activated Clotting Time: 120 seconds

## 2018-07-27 LAB — HEPARIN LEVEL (UNFRACTIONATED): Heparin Unfractionated: 1.92 IU/mL — ABNORMAL HIGH (ref 0.30–0.70)

## 2018-07-27 SURGERY — LEFT HEART CATH AND CORONARY ANGIOGRAPHY
Anesthesia: LOCAL

## 2018-07-27 MED ORDER — LIDOCAINE HCL (PF) 1 % IJ SOLN
INTRAMUSCULAR | Status: DC | PRN
  Administered 2018-07-27: 15 mL via INTRADERMAL
  Administered 2018-07-27: 2 mL via INTRADERMAL

## 2018-07-27 MED ORDER — SODIUM CHLORIDE 0.9 % IV SOLN: 250.0000 mL | INTRAVENOUS | Status: DC | PRN

## 2018-07-27 MED ORDER — FENTANYL CITRATE (PF) 100 MCG/2ML IJ SOLN
INTRAMUSCULAR | Status: DC | PRN
  Administered 2018-07-27: 25 ug via INTRAVENOUS

## 2018-07-27 MED ORDER — HEPARIN (PORCINE) IN NACL 1000-0.9 UT/500ML-% IV SOLN
INTRAVENOUS | Status: AC
  Filled 2018-07-27: qty 1000

## 2018-07-27 MED ORDER — SODIUM CHLORIDE 0.9% FLUSH
3.0000 mL | INTRAVENOUS | Status: DC | PRN
  Administered 2018-07-27: 3 mL via INTRAVENOUS
  Filled 2018-07-27: qty 3

## 2018-07-27 MED ORDER — HEPARIN (PORCINE) IN NACL 1000-0.9 UT/500ML-% IV SOLN
INTRAVENOUS | Status: DC | PRN
  Administered 2018-07-27 (×2): 500 mL

## 2018-07-27 MED ORDER — FENTANYL CITRATE (PF) 100 MCG/2ML IJ SOLN
INTRAMUSCULAR | Status: AC
  Filled 2018-07-27: qty 2

## 2018-07-27 MED ORDER — IOHEXOL 350 MG/ML SOLN
INTRAVENOUS | Status: DC | PRN
  Administered 2018-07-27: 40 mL via INTRA_ARTERIAL

## 2018-07-27 MED ORDER — VERAPAMIL HCL 2.5 MG/ML IV SOLN
INTRAVENOUS | Status: AC
  Filled 2018-07-27: qty 2

## 2018-07-27 MED ORDER — LIDOCAINE HCL (PF) 1 % IJ SOLN
INTRAMUSCULAR | Status: AC
  Filled 2018-07-27: qty 30

## 2018-07-27 MED ORDER — MIDAZOLAM HCL 2 MG/2ML IJ SOLN
INTRAMUSCULAR | Status: DC | PRN
  Administered 2018-07-27: 2 mg via INTRAVENOUS

## 2018-07-27 MED ORDER — SODIUM CHLORIDE 0.9 % WEIGHT BASED INFUSION: 1.0000 mL/kg/h | INTRAVENOUS | Status: AC

## 2018-07-27 MED ORDER — SODIUM CHLORIDE 0.9% FLUSH
3.0000 mL | Freq: Two times a day (BID) | INTRAVENOUS | Status: DC
  Administered 2018-07-30 – 2018-07-31 (×3): 3 mL via INTRAVENOUS

## 2018-07-27 MED ORDER — MIDAZOLAM HCL 2 MG/2ML IJ SOLN
INTRAMUSCULAR | Status: AC
  Filled 2018-07-27: qty 2

## 2018-07-27 MED ORDER — HEPARIN (PORCINE) IN NACL 100-0.45 UNIT/ML-% IJ SOLN
1050.0000 [IU]/h | INTRAMUSCULAR | Status: DC
  Administered 2018-07-27: 850 [IU]/h via INTRAVENOUS
  Administered 2018-07-28 – 2018-08-01 (×5): 1050 [IU]/h via INTRAVENOUS
  Filled 2018-07-27 (×5): qty 250

## 2018-07-27 SURGICAL SUPPLY — 11 items
CATH INFINITI 5FR MULTPACK ANG (CATHETERS) ×2 IMPLANT
GLIDESHEATH SLEND SS 6F .021 (SHEATH) ×2 IMPLANT
GUIDEWIRE INQWIRE 1.5J.035X260 (WIRE) ×1 IMPLANT
INQWIRE 1.5J .035X260CM (WIRE) ×2
KIT HEART LEFT (KITS) ×2 IMPLANT
PACK CARDIAC CATHETERIZATION (CUSTOM PROCEDURE TRAY) ×2 IMPLANT
SHEATH PINNACLE 5F 10CM (SHEATH) ×2 IMPLANT
SHEATH PROBE COVER 6X72 (BAG) ×2 IMPLANT
TRANSDUCER W/STOPCOCK (MISCELLANEOUS) ×2 IMPLANT
TUBING CIL FLEX 10 FLL-RA (TUBING) ×2 IMPLANT
WIRE EMERALD 3MM-J .035X150CM (WIRE) ×2 IMPLANT

## 2018-07-27 NOTE — Progress Notes (Signed)
Pt was scratching his back and has 3cm skin tear on back, bleeding. Bandage applied to skin tear, bleeding controlled.

## 2018-07-27 NOTE — Progress Notes (Signed)
ANTICOAGULATION CONSULT NOTE - Follow Up Consult  Pharmacy Consult for heparin Indication: atrial fibrillation  Labs: Recent Labs    07/24/18 1118 07/26/18 0055 07/26/18 0228 07/26/18 0804 07/26/18 1401 07/26/18 2125 07/27/18 0531  HGB 10.4* 10.0*  --   --   --   --  10.5*  HCT 32.1* 31.2*  --   --   --   --  32.8*  PLT 183 187  --   --   --   --  206  APTT  --   --   --   --  125* 107* 75*  LABPROT  --  16.2*  --   --   --   --   --   INR  --  1.31  --   --   --   --   --   HEPARINUNFRC  --   --   --   --  >2.20*  --  1.92*  CREATININE 1.61* 1.54*  --  1.43*  --   --   --   TROPONINI  --   --  0.04* 0.03* <0.03  --   --     Assessment/Plan:  77yo male therapeutic on heparin after rate changes. Will continue gtt at current rate and confirm stable with additional PTT.   Wynona Neat, PharmD, BCPS  07/27/2018,7:26 AM

## 2018-07-27 NOTE — Progress Notes (Signed)
Pre-op Cardiac Surgery  Carotid Findings:   Right ICA 1-39% stenosis, Left ICA 40-59% stenosis. Bilateral vertebral arteries patent with antegrade flow.   Upper Extremity Right Left  Brachial Pressures 143 142  Radial Waveforms Biphasic Triphasic  Ulnar Waveforms Biphasic Biphasic  Palmar Arch (Allen's Test) See below See below   Findings:   Right Upper Extremity: Doppler waveforms remain within normal limits with right radial compression. Doppler waveform obliterate with right ulnar compression.  Left Upper Extremity: Doppler waveforms remain within normal limits with left radial compression. Doppler waveforms remain within normal limits with left ulnar compression.   Lower  Extremity Right Left  Dorsalis Pedis biphasic monophasic  Posterior Tibial biphasic monophasic  Ankle/Brachial Indices 1.02 0.96   Findings:   Resting bilateral ankle-brachial index are within normal range. No evidence of significant bilateral lower extremities arterial disease.  Hongying Landry Mellow (RDMS RVT) 07/27/18 3:34 PM

## 2018-07-27 NOTE — Consult Note (Addendum)
[address]       [place] [address]             [phone]        Dacen L Pitsenbarger [place] Medical Record #[id number] Date of Birth: 1941/03/12  Referring: Dr. Peter Martinique, MD Primary Care: Street, Sharon Mt, MD Primary Cardiologist:Christopher Angelena Form, MD  Chief Complaint:    Chief Complaint  Patient presents with  . Chest Pain and shortness of breath  Reason for consultation:Coronary artery disease  History of Present Illness:     This is a 77 year old Caucasian male with a past medical history of coronary artery disease (s/p STEMI 2013, NSTEMI 2014, PCI with stents) hypertension, hyperlipidemia, CKD (stage III), paroxysmal atrial flutter, diabetes mellitus (type II) who presented to [facility] ED on 07/26/2018 with complaints of chest pain and some shortness of breath which apparently woke him from sleep. He took two Nitroglycerin and chest pain resolved. He denied pain radiation or nausea. He apparently had similar symptoms the previus day but they occurred while he was awake. He did state that he has had progressive shortness of breath with exertion over the past month. EKG showed  sinus rhythm without ST elevation or depression. Peak Troponin I was 0.04. He underwent a cardiac catheterization today and was found to have significant 3 vessel coronary artery disease (80% left main disease included). He is on a Heparin drip. He last took Plavix yesterday. A cardiothoracic consultation has been obtained for the consideration of coronary artery bypass grafting surgery. He denies chest pain or shortness of breath. Vital signs are stable.  Current Activity/ Functional Status: Patient is independent with mobility/ambulation, transfers, ADL's, IADL's.   Zubrod Score: At the time of surgery this patient's most appropriate activity status/level should be described as: []     0    Normal activity, no symptoms [x]     1    Restricted in physical strenuous activity  but ambulatory, able to do out light work []     2    Ambulatory and capable of self care, unable to do work activities, up and about                 more than 50%  Of the time                            []     3    Only limited self care, in bed greater than 50% of waking hours []     4    Completely disabled, no self care, confined to bed or chair []     5    Moribund  Past Medical History:  Diagnosis Date  . Acute renal insufficiency    a. During 11/2013 adm with atrial flutter.  . Anemia, unspecified   . CAD (coronary artery disease), native coronary artery    a. s/p cath May 2011 >> DES distal RCA;  b. 01/2013 NSTEMI >> OM1 99p (2.75x12 Promus Premier DES);  c. LHC (2/15): PCI: Promus DES to dist RCA ;  d. LHC 1/16 - dLM 51, oLAD 30, small D1 tandem 27, pOM1 stent ok, mOM 50-60, dOM bifurcation 50-60 in both vessels, AV 33-70 jailed by stent, pRCA 18 mRCA stent ok, dRCA stent ok, PLB smal 50, dPDA 60, 80; EF 35-40 >> med Rx - PCI of PDA if angina  . Chronic combined systolic and diastolic CHF (congestive  heart failure) ([place]) 12/23/2015   Echo 2/17: Mild LVH, EF 45-50%, inferior akinesis, grade 2 diastolic dysfunction, mild AI, mild MR, mild LAE, PASP 44 mmHg   . Colorectal polyps 2002  . Diabetes mellitus    AODM  . Erectile dysfunction   . History of echocardiogram    a. 01/2013 Echo: EF 55%, mid-dist inflat HK, Gr1 DD, Triv AI/TR, Mild MR.;  b.  Echo (1/15): Mild LVH, EF 55%, inferolateral hypokinesis, grade 1 diastolic dysfunction, mild AI, trivial MR, moderate LAE, PASP 36 mmHg   . Hx of cardiovascular stress test    Stress myoview 08/07/13 with LVEF 44%, inferior scar, no ischemia  . Hyperlipidemia   . Hypertension   . Ischemic cardiomyopathy    a. EF 35-40% by LHC in 1/16; b. Echo 2/17: Mild LVH, EF 45-50%, inferior akinesis, grade 2 diastolic dysfunction, mild AI, mild MR, mild LAE, PASP 44 mmHg  . Myocardial infarction ([place])   . PAF (paroxysmal atrial fibrillation) ([place])    a. Dx  11/2013. Spont conv. Placed on eliquis.    Past Surgical History:  Procedure Laterality Date  . COLONOSCOPY W/ POLYPECTOMY  2002   [facility]  . COLONOSCOPY W/ POLYPECTOMY  2004; 2007 negative  . CORONARY ANGIOPLASTY  12/26/2013  . CORONARY STENT PLACEMENT  2007, 2011  . infantile paralysis facial asymmetry    . LEFT HEART CATH AND CORONARY ANGIOGRAPHY N/A 05/18/2017   Procedure: Left Heart Cath and Coronary Angiography;  Surgeon: Martinique, Peter M, MD;  Location: [facility];  Service: Cardiovascular;  Laterality: N/A;  . LEFT HEART CATHETERIZATION WITH CORONARY ANGIOGRAM N/A 10/02/2012   Procedure: LEFT HEART CATHETERIZATION WITH CORONARY ANGIOGRAM;  Surgeon: Peter M Martinique, MD;  Location: [facility] CATH LAB;  Service: Cardiovascular;  Laterality: N/A;  . LEFT HEART CATHETERIZATION WITH CORONARY ANGIOGRAM N/A 02/20/2013   Procedure: LEFT HEART CATHETERIZATION WITH CORONARY ANGIOGRAM;  Surgeon: Burnell Blanks, MD;  Location: [facility] CATH LAB;  Service: Cardiovascular;  Laterality: N/A;  . LEFT HEART CATHETERIZATION WITH CORONARY ANGIOGRAM N/A 12/26/2013   Procedure: LEFT HEART CATHETERIZATION WITH CORONARY ANGIOGRAM;  Surgeon: Burnell Blanks, MD;  Location: [facility] CATH LAB;  Service: Cardiovascular;  Laterality: N/A;  . LEFT HEART CATHETERIZATION WITH CORONARY ANGIOGRAM N/A 11/28/2014   Procedure: LEFT HEART CATHETERIZATION WITH CORONARY ANGIOGRAM;  Surgeon: Burnell Blanks, MD;  Location: [facility] CATH LAB;  Service: Cardiovascular;  Laterality: N/A;  . PERCUTANEOUS CORONARY STENT INTERVENTION (PCI-S)  10/02/2012   Procedure: PERCUTANEOUS CORONARY STENT INTERVENTION (PCI-S);  Surgeon: Peter M Martinique, MD;  Location: [facility] CATH LAB;  Service: Cardiovascular;;  . PERCUTANEOUS CORONARY STENT INTERVENTION (PCI-S)  02/20/2013   Procedure: PERCUTANEOUS CORONARY STENT INTERVENTION (PCI-S);  Surgeon: Burnell Blanks, MD;  Location: [facility] CATH LAB;  Service: Cardiovascular;;  . PERCUTANEOUS CORONARY  STENT INTERVENTION (PCI-S)  12/26/2013   Procedure: PERCUTANEOUS CORONARY STENT INTERVENTION (PCI-S);  Surgeon: Burnell Blanks, MD;  Location: [facility] CATH LAB;  Service: Cardiovascular;;  . WISDOM TOOTH EXTRACTION      Social History   Tobacco Use  Smoking Status Former Smoker  . Last attempt to quit: 11/02/1984  . Years since quitting: 33.7  Smokeless Tobacco Never Used  Tobacco Comment   smoked [place], up to 3 ppd (!)    Social History   Substance and Sexual Activity  Alcohol Use No   Patient still works in Public librarian (more supervisory)  Family History: Mother deceased in her 80's Father deceased at age 34 Has  a brother who died at 12 from heart failure  Allergies  Allergen Reactions  . Brilinta [Ticagrelor] Other (See Comments)    Arthralgias & myalgias  . Crestor [Rosuvastatin] Other (See Comments)    Myalgias    Current Facility-Administered Medications  Medication Dose Route Frequency Provider Last Rate Last Dose  . 0.9 %  sodium chloride infusion  250 mL Intravenous PRN Tommye Standard, Krista M., PA-C      . 0.9 %  sodium chloride infusion   Intravenous Continuous Roby Lofts M., PA-C 50 mL/hr at 07/27/18 0546    . 0.9% sodium chloride infusion  1 mL/kg/hr Intravenous Continuous Martinique, Peter M, MD      . Doug Sou Hold] acetaminophen (TYLENOL) tablet 650 mg  650 mg Oral Q4H PRN Opyd, Ilene Qua, MD      . Doug Sou Hold] amiodarone (PACERONE) tablet 200 mg  200 mg Oral Daily Opyd, Ilene Qua, MD   200 mg at 07/26/18 1059  . [MAR Hold] atorvastatin (LIPITOR) tablet 20 mg  20 mg Oral q1800 Opyd, Ilene Qua, MD   20 mg at 07/26/18 1759  . [MAR Hold] chlorpheniramine-HYDROcodone (TUSSIONEX) 10-8 MG/5ML suspension 5 mL  5 mL Oral Q12H Lady Deutscher, MD   5 mL at 07/26/18 2231  . [MAR Hold] cholecalciferol (VITAMIN D) tablet 1,000 Units  1,000 Units Oral QPM Opyd, Ilene Qua, MD   1,000 Units at 07/26/18 1802  . [MAR Hold] clopidogrel (PLAVIX) tablet 75 mg  75 mg  Oral Daily Opyd, Ilene Qua, MD   75 mg at 07/26/18 1058  . heparin ADULT infusion 100 units/mL (25000 units/263mL sodium chloride 0.45%)  850 Units/hr Intravenous Continuous Vann, Jessica U, DO      . [MAR Hold] insulin aspart (novoLOG) injection 0-9 Units  0-9 Units Subcutaneous Q4H Opyd, Ilene Qua, MD      . Doug Sou Hold] isosorbide mononitrate (IMDUR) 24 hr tablet 60 mg  60 mg Oral Daily Opyd, Ilene Qua, MD   60 mg at 07/26/18 1058  . [MAR Hold] metoprolol tartrate (LOPRESSOR) tablet 25 mg  25 mg Oral BID Opyd, Ilene Qua, MD   25 mg at 07/26/18 2229  . [MAR Hold] morphine 2 MG/ML injection 1-3 mg  1-3 mg Intravenous Q4H PRN Opyd, Ilene Qua, MD      . Doug Sou Hold] multivitamin with minerals tablet 1 tablet  1 tablet Oral QHS Opyd, Ilene Qua, MD   1 tablet at 07/26/18 2230  . [MAR Hold] nitroGLYCERIN (NITROSTAT) SL tablet 0.4 mg  0.4 mg Sublingual Q5 Min x 3 PRN Opyd, Ilene Qua, MD      . Doug Sou Hold] ondansetron (ZOFRAN) injection 4 mg  4 mg Intravenous Q6H PRN Opyd, Ilene Qua, MD      . sodium chloride flush (NS) 0.9 % injection 3 mL  3 mL Intravenous Q12H Kroeger, Krista M., PA-C   3 mL at 07/26/18 2233  . sodium chloride flush (NS) 0.9 % injection 3 mL  3 mL Intravenous PRN Kroeger, Krista M., PA-C        Medications Prior to Admission  Medication Sig Dispense Refill Last Dose  . amiodarone (PACERONE) 200 MG tablet Take 1 tablet (200 mg total) by mouth daily. 90 tablet 3 07/25/2018 at Unknown time  . atorvastatin (LIPITOR) 20 MG tablet Take 1 tablet (20 mg total) by mouth daily. 90 tablet 3 07/25/2018 at Unknown time  . cholecalciferol (VITAMIN D) 1000 UNITS tablet Take 1,000 Units by mouth every evening.  07/25/2018 at Unknown time  . clopidogrel (PLAVIX) 75 MG tablet TAKE 1 TABLET EVERY DAY WITH BREAKFAST (Patient taking differently: Take 75 mg by mouth daily. ) 90 tablet 3 07/25/2018 at Unknown time  . ELIQUIS 5 MG TABS tablet TAKE 1 TABLET BY MOUTH TWICE DAILY (Patient taking differently: Take 5  mg by mouth 2 (two) times daily. ) 180 tablet 3 07/25/2018 at 1800  . fexofenadine (ALLEGRA) 180 MG tablet Take 180 mg by mouth daily as needed for allergies.    07/25/2018 at Unknown time  . furosemide (LASIX) 20 MG tablet Take 1 tablet (20 mg total) by mouth daily. 90 tablet 3 07/25/2018 at Unknown time  . isosorbide mononitrate (IMDUR) 60 MG 24 hr tablet Take 1 tablet (60 mg total) by mouth daily. 90 tablet 3 07/25/2018 at Unknown time  . lisinopril (PRINIVIL,ZESTRIL) 20 MG tablet Take 1 tablet (20 mg total) by mouth daily. 90 tablet 3 07/25/2018 at Unknown time  . metFORMIN (GLUCOPHAGE) 500 MG tablet Take 500 mg by mouth daily.   07/25/2018 at Unknown time  . metoprolol tartrate (LOPRESSOR) 25 MG tablet Take 1 tablet (25 mg total) by mouth 2 (two) times daily. 180 tablet 3 07/25/2018 at 1800  . Multiple Vitamin (MULTIVITAMIN) tablet Take 1 tablet by mouth at bedtime.    07/25/2018 at Unknown time  . nitroGLYCERIN (NITROSTAT) 0.4 MG SL tablet Place 0.4 mg under the tongue every 5 (five) minutes as needed for chest pain.   07/25/2018 at Unknown time  . vitamin E 100 UNIT capsule Take 100 Units by mouth at bedtime.    07/25/2018 at Unknown time    Family History  Problem Relation Age of Onset  . Heart attack Brother 37  . Colon cancer Brother   . Diabetes Neg Hx   . Stroke Neg Hx   . Hypertension Neg Hx    Review of Systems:    Cardiac Review of Systems: Y or  [  N  ]= no  Chest Pain [ Y-on admission ] Exertional SOB  [ y ]     Pedal Edema [ N  ]    Palpitations [  N] Syncope  [ N ]  Presyncope [ N  ]  General Review of Systems: [Y] = yes [ N ]=no Constitional: nausea [ N ]; night sweats Aqua.Slicker  ]; fever [  N]; or chills [ N ]                                                               Dental: Last Dentist visit: 20 years ago. Has dentures top and bottom  Eye : blurred vision Aqua.Slicker  ]; diplopia [  N ]; Amaurosis fugax[ N ]; Resp: cough Aqua.Slicker  ];  wheezing[ N ];  hemoptysis[ N];  GI:  vomiting[ N ];   dysphagia[N  ]; melena[N  ];  hematochezia Aqua.Slicker  ];  GU:  hematuria[ N ];   dysuria [ N ];               Skin: rash, swelling[N  ];,  Musculosketetal: myalgias[N  ];   jHeme/Lymph:   anemia[ Y ];  Neuro: TIA[ N ];    stroke[ N ];  vertigo[  N];  seizures[  N];  Psych:depression[ N ]; anxiety[N  ];  Endocrine: diabetes[ Y ];  thyroid dysfunction[ N ];                  Physical Exam: BP (!) 135/45   Pulse 70   Temp 99.2 F (37.3 C) (Oral)   Resp 15   Ht 5\' 11"  (1.803 m)   Wt 85 kg   SpO2 100%   BMI 26.14 kg/m    General appearance: alert, cooperative and no distress Head: Normocephalic, without obvious abnormality, atraumatic Neck: no carotid bruit, no JVD and supple, symmetrical, trachea midline Resp: clear to auscultation bilaterally Cardio: RRR, no murmur GI: Soft, non tender, bowel sounds present Extremities: No LE edema;Palpable DP/PT bilaterally.  Neurologic: Grossly normal No palpable cervical or axillary nodes   Diagnostic Studies & Laboratory data:    LEFT HEART CATH AND CORONARY ANGIOGRAPHY  Conclusion     Mid LM to Dist LM lesion is 80% stenosed.  Dist LM to Ost LAD lesion is 90% stenosed.  Ost 1st Diag to 1st Diag lesion is 80% stenosed.  Previously placed Ost Ramus to Ramus stent (unknown type) is widely patent.  Ramus lesion is 80% stenosed.  Ost Cx to Prox Cx lesion is 99% stenosed.  Previously placed Prox RCA to Mid RCA stent (unknown type) is widely patent.  Previously placed Dist RCA stent (unknown type) is widely patent.  Prox RCA lesion is 70% stenosed.  RPDA lesion is 90% stenosed.  LV end diastolic pressure is normal.   1. Severe left main and 3 vessel obstructive CAD. The left main and ostial LAD have progressed significantly from prior procedure 2. Normal LVEDP  Plan: will consult CT surgery for CABG. Plavix will be held. Resume IV heparin. Will check Echo for LV function     Mid LM to Dist LM lesion 80% stenosed  Mid LM to  Dist LM lesion is 80% stenosed. The lesion is severely calcified.  Dist LM to Ost LAD lesion 90% stenosed  Dist LM to Ost LAD lesion is 90% stenosed. The lesion is severely calcified.  Left Anterior Descending  First Diagonal Branch  Ost 1st Diag to 1st Diag lesion 80% stenosed  Ost 1st Diag to 1st Diag lesion is 80% stenosed.  Ramus Intermedius  Vessel is large.  Ost Ramus to Ramus lesion 0% stenosed  Previously placed Ost Ramus to Ramus stent (unknown type) is widely patent.  Ramus lesion 80% stenosed  Ramus lesion is 80% stenosed.  Left Circumflex  Ost Cx to Prox Cx lesion 99% stenosed  Ost Cx to Prox Cx lesion is 99% stenosed.  Right Coronary Artery  Prox RCA lesion 70% stenosed  Prox RCA lesion is 70% stenosed.  Prox RCA to Mid RCA lesion 0% stenosed  Previously placed Prox RCA to Mid RCA stent (unknown type) is widely patent.  Dist RCA lesion 0% stenosed  Previously placed Dist RCA stent (unknown type) is widely patent.  Right Posterior Descending Artery  RPDA lesion 90% stenosed  RPDA lesion is 90% stenosed.  Intervention   No interventions have been documented.  Left Heart   Left Ventricle LV end diastolic pressure is normal.  Coronary Diagrams   Diagnostic Diagram         Recent Radiology Findings:   Dg Chest 2 View  Result Date: 07/26/2018 CLINICAL DATA:  Chest pain EXAM: CHEST - 2 VIEW COMPARISON:  07/24/2018 chest radiograph. FINDINGS: Normal heart size. Prominence of the right paratracheal stripe/right superior hilar contour.  Aortic atherosclerosis. No pneumothorax. No pleural effusion. Hyperinflated lungs. IMPRESSION: Prominence of the right paratracheal stripe/right suprahilar contour. Recommend chest CT with IV contrast to exclude adenopathy/lung mass. Hyperinflated lungs, suggesting COPD. Electronically Signed   By: Ilona Sorrel M.D.   On: 07/26/2018 01:14  Dg Chest 2 View  Result Date: 07/26/2018 CLINICAL DATA:  Chest pain EXAM: CHEST - 2 VIEW  COMPARISON:  07/24/2018 chest radiograph. FINDINGS: Normal heart size. Prominence of the right paratracheal stripe/right superior hilar contour. Aortic atherosclerosis. No pneumothorax. No pleural effusion. Hyperinflated lungs. IMPRESSION: Prominence of the right paratracheal stripe/right suprahilar contour. Recommend chest CT with IV contrast to exclude adenopathy/lung mass. Hyperinflated lungs, suggesting COPD. Electronically Signed   By: Ilona Sorrel M.D.   On: 07/26/2018 01:14    Ct Chest Wo Contrast  Result Date: 07/27/2018 CLINICAL DATA:  Evaluate right paratracheal stripe/superior EXAM: CT CHEST WITHOUT CONTRAST TECHNIQUE: Multidetector CT imaging of the chest was performed following the standard protocol without IV contrast. COMPARISON:  CXR 07/26/2018 FINDINGS: Cardiovascular: Atherosclerosis of the great vessels at their origins with conventional branch pattern. Moderate atherosclerosis of the distal aortic arch and descending aorta without aneurysm. Left main and three-vessel dense coronary arteriosclerosis. Heart size is normal without pericardial effusion. Mediastinum/Nodes: Mild thyromegaly with retroclavicular extension of the thyroid gland without dominant mass seen. Mediastinal adenopathy along the prevascular, right upper and lower paratracheal portions of the mediastinum, the largest estimated at 2.3 cm short axis along the right lower paratracheal portion given limitations of a noncontrast study. Mainstem bronchi and trachea are patent. Assessment of hilar adenopathy is somewhat limited due to lack of IV contrast but given new areas of subtle soft tissue prominence, suspect that there is at least right-sided adenopathy since prior exam measuring up to 1.2 cm short axis. Lungs/Pleura: There is a partially necrotic appearing right paramediastinal soft tissue mass in the right upper lobe, the margins somewhat difficult to identify due to lack of IV contrast but is estimated as measuring  approximately 3.5 x 3.4 x 3.2 cm, series 3/61 and series 6/40. Additionally, nodular opacities are noted along the major fissure in the posterior segment of right upper lobe, new since prior measuring up to 6 mm. Partially cavitary nodule in the right middle lobe is identified measuring up to 8 mm, series 4/72 with smaller sub 3 mm nodule, series 4/66. Dependent changes are noted along the posterior aspect of the right upper lobe. Small right effusion is present with sub pleural areas of streaky interstitial change likely atelectatic and less likely lymphangitic. Paraseptal and centrilobular emphysema is noted, upper lobe predominant with scarring at the apices. Mild peribronchial thickening and bronchiectasis is noted in the lower lobes. Upper Abdomen: No adrenal mass. No definite space-occupying mass of the included liver. Punctate calcification in the pole of the included left kidney. Musculoskeletal: No chest wall mass or suspicious bone lesions identified. IMPRESSION: 1. Partially necrotic masslike abnormality abutting the superior mediastinum within the medial right upper lobe measuring approximately 3.5 x 3.4 x 3.2 cm. This is associated with smaller nodular opacities in the right middle lobe and abutting the major fissure, the next largest is approximately 8 mm and partially cavitary in appearance in the right middle lobe. Pulmonary and/or cardiothoracic consultation is suggested. 2. There is associated mediastinal and right hilar lymphadenopathy as above described likely metastatic. 3. Small right pleural effusion. 4. Centrilobular and paraseptal emphysema. 5. Left main and three-vessel coronary arteriosclerosis and aortic atherosclerosis. Aortic Atherosclerosis (ICD10-I70.0) and Emphysema (ICD10-J43.9). Electronically Signed  By: Ashley Royalty M.D.   On: 07/27/2018 23:29     I have independently reviewed the above radiologic studies and discussed with the patient   Recent Lab Findings: Lab Results    Component Value Date   WBC 7.0 07/27/2018   HGB 10.5 (L) 07/27/2018   HCT 32.8 (L) 07/27/2018   PLT 206 07/27/2018   GLUCOSE 96 07/27/2018   CHOL 170 11/04/2013   TRIG 166 (H) 11/04/2013   HDL 34 (L) 11/04/2013   LDLDIRECT 69.3 11/26/2008   LDLCALC 103 (H) 11/04/2013   ALT 41 10/19/2017   AST 34 10/19/2017   NA 129 (L) 07/27/2018   K 4.2 07/27/2018   CL 94 (L) 07/27/2018   CREATININE 1.43 (H) 07/27/2018   BUN 15 07/27/2018   CO2 25 07/27/2018   TSH 0.628 10/15/2017   INR 1.31 07/26/2018   HGBA1C 6.3 (H) 10/16/2017   Assessment / Plan:   1. Multivessel CAD-has had previous MI's and stents (on Plavix). Also, was on Apixaban for PAF. Has significant LM disease so will remain on Heparin drip until surgery. Await echo to evaluate for valvular disease. Will wait for Plavix washout. Surgery scheduled for Monday with Dr. Servando Snare. 2. Chronic diastolic heart failure 3. History of PAFl-on Amiodarone and Apixaban prior to admission 4. History of hypertension-on Imdur 60 mg daily and Metoprolol tartrate 25 mg bid 5. History of hyperlipidemia-on Atorvastatin 20 mg at hs 6. History of CKD (stage III)-creatinine today is 1.43 Chronic Kidney Disease   Stage I     GFR >90  Stage II    GFR 60-89  Stage IIIA GFR 45-59  Stage IIIB GFR 30-44  Stage IV   GFR 15-29  Stage V    GFR  <15 Lab Results  Component Value Date   CREATININE 1.43 (H) 07/27/2018  Estimated Creatinine Clearance: 46.1 mL/min (A) (by C-G formula based on SCr of 1.43 mg/dL (H)). 7. History of DM-will check HGA1C. Patient states he stopped taking Metformin about 3 years ago and that he does not have diabetes. 8. Prominence of the right paratracheal stripe/right superior hilar contour and aortic atherosclerosis.  9. Mass abutting the superior mediastinum within the medial right upper lobe measuring approximately 3.5 x 3.4 x 3.2 cm  I have seen the patient reviewed his history reviewed with him the findings on the CT scan done  last night.  Findings are highly suggestive of lung cancer, if non-small cell likely stage IIIb based on limited imaging.  The current situation presents a problem is that with the patient's current cardiac status there is little leeway for further evaluation and/or treatment for his possible lung cancer.  Even make a tissue diagnosis with his cardiac status and anticoagulated creates a problem.  I reviewed the scans with radiology today, there is little utility in giving contrast with the CT scan as it is unlikely to give [place] any further information.  PET scan can be considered, but requires transportation to [facility] and back.  I have asked the interventional cardiologist to review the case now with further information about the patient's CT scan, evaluate for further less invasive treatments of his coronary disease.  Unfortunately he has had extensive interventions in the past and it is unlikely that further percutaneous attempts at revascularization will be possible.   Only option may to proceed with CABG, with simultaneous biopsy and post op treatment depending on path findings   Grace Isaac MD      [phone]  [address] [place] [address] Office [phone]   [place]

## 2018-07-27 NOTE — Interval H&P Note (Signed)
History and Physical Interval Note:  07/27/2018 11:09 AM  Gregory Bates  has presented today for surgery, with the diagnosis of chest pain  The various methods of treatment have been discussed with the patient and family. After consideration of risks, benefits and other options for treatment, the patient has consented to  Procedure(s): LEFT HEART CATH AND CORONARY ANGIOGRAPHY (N/A) as a surgical intervention .  The patient's history has been reviewed, patient examined, no change in status, stable for surgery.  I have reviewed the patient's chart and labs.  Questions were answered to the patient's satisfaction.   Cath Lab Visit (complete for each Cath Lab visit)  Clinical Evaluation Leading to the Procedure:   ACS: Yes.    Non-ACS:    Anginal Classification: CCS IV  Anti-ischemic medical therapy: Maximal Therapy (2 or more classes of medications)  Non-Invasive Test Results: No non-invasive testing performed  Prior CABG: No previous CABG        Gregory Bates 07/27/2018 11:09 AM

## 2018-07-27 NOTE — Progress Notes (Signed)
TCTS consult placed, spoke w Levonne Spiller. Dayna Dunn PA-C

## 2018-07-27 NOTE — Progress Notes (Signed)
[place] for heparin Indication: atrial fibrillation  Allergies  Allergen Reactions  . Brilinta [Ticagrelor] Other (See Comments)    Arthralgias & myalgias  . Crestor [Rosuvastatin] Other (See Comments)    Myalgias    Patient Measurements: Height: 5\' 11"  (180.3 cm) Weight: 187 lb 6.3 oz (85 kg) IBW/kg (Calculated) : 75.3  Vital Signs: Temp: 99.2 F (37.3 C) (09/25 1015) Temp Source: Oral (09/25 1015) BP: 142/46 (09/25 1245) Pulse Rate: 62 (09/25 1245)  Labs: Recent Labs    07/26/18 0055 07/26/18 0228 07/26/18 0804 07/26/18 1401 07/26/18 2125 07/27/18 0531 07/27/18 0939  HGB 10.0*  --   --   --   --  10.5*  --   HCT 31.2*  --   --   --   --  32.8*  --   PLT 187  --   --   --   --  206  --   APTT  --   --   --  125* 107* 75*  --   LABPROT 16.2*  --   --   --   --   --   --   INR 1.31  --   --   --   --   --   --   HEPARINUNFRC  --   --   --  >2.20*  --  1.92*  --   CREATININE 1.54*  --  1.43*  --   --   --  1.43*  TROPONINI  --  0.04* 0.03* <0.03  --   --   --     Estimated Creatinine Clearance: 46.1 mL/min (A) (by C-G formula based on SCr of 1.43 mg/dL (H)).   Medical History: Past Medical History:  Diagnosis Date  . Acute renal insufficiency    a. During 11/2013 adm with atrial flutter.  . Anemia, unspecified   . CAD (coronary artery disease), native coronary artery    a. s/p cath May 2011 >> DES distal RCA;  b. 01/2013 NSTEMI >> OM1 99p (2.75x12 Promus Premier DES);  c. LHC (2/15): PCI: Promus DES to dist RCA ;  d. LHC 1/16 - dLM 12, oLAD 30, small D1 tandem 38, pOM1 stent ok, mOM 50-60, dOM bifurcation 50-60 in both vessels, AV 31-70 jailed by stent, pRCA 66 mRCA stent ok, dRCA stent ok, PLB smal 50, dPDA 60, 80; EF 35-40 >> med Rx - PCI of PDA if angina  . Chronic combined systolic and diastolic CHF (congestive heart failure) ([place]) 12/23/2015   Echo 2/17: Mild LVH, EF 45-50%, inferior akinesis, grade 2 diastolic  dysfunction, mild AI, mild MR, mild LAE, PASP 44 mmHg   . Colorectal polyps 2002  . Diabetes mellitus    AODM  . Erectile dysfunction   . History of echocardiogram    a. 01/2013 Echo: EF 55%, mid-dist inflat HK, Gr1 DD, Triv AI/TR, Mild MR.;  b.  Echo (1/15): Mild LVH, EF 55%, inferolateral hypokinesis, grade 1 diastolic dysfunction, mild AI, trivial MR, moderate LAE, PASP 36 mmHg   . Hx of cardiovascular stress test    Stress myoview 08/07/13 with LVEF 44%, inferior scar, no ischemia  . Hyperlipidemia   . Hypertension   . Ischemic cardiomyopathy    a. EF 35-40% by LHC in 1/16; b. Echo 2/17: Mild LVH, EF 45-50%, inferior akinesis, grade 2 diastolic dysfunction, mild AI, mild MR, mild LAE, PASP 44 mmHg  . Myocardial infarction ([place])   . PAF (paroxysmal atrial fibrillation) (  [place])    a. Dx 11/2013. Spont conv. Placed on eliquis.     Assessment: 77yo male c/o CP w/ mild SOB and productive cough, took NTG x2 at home w/ relief, admitted for further w/u, to transition from Eliquis (for Afib) to UFH; last dose of Eliquis taken PTA 9/23 at 1800.  Underwent cardiac cath today - showing severe 3 vessel CAD. Plan to restart heparin 8 hours after sheath removal (removed at 1245 on 9/25) while awaiting CABG evaluation. Will resume at previously therapeutic rate.   Goal of Therapy:  Heparin level 0.3-0.7 units/ml aPTT 66-102 seconds Monitor platelets by anticoagulation protocol: Yes   Plan:  Restart heparin infusion at 850 units/hr at 2100 Check aPTT/HL in 8 hours Monitor daily aPTT / heparin level, CBC, s/s of bleed  Doylene Canard, PharmD Clinical Pharmacist  Pager: [phone] Phone: [phone] 07/27/2018 2:46 PM

## 2018-07-27 NOTE — Progress Notes (Signed)
Progress Note    Gregory Bates  FFM:[id number] DOB: Feb 06, 1941  DOA: 07/25/2018 PCP: Emmaline Kluver, MD    Brief Narrative:     Medical records reviewed and are as summarized below:  Gregory Bates is an 77 y.o. male with medical history significant for coronary artery disease, chronic diastolic CHF, chronic kidney disease stage III, paroxysmal atrial fibrillation on Eliquis, hypertension, and type 2 diabetes mellitus, now presenting to the emergency department for evaluation of chest pain.  Patient has known diffuse CAD without suitable target for PCI on cath from 1 year ago.  He reports chest pain at rest that began 2 to 3 days ago, resolved with nitroglycerin, but has been recurring. Had cath on 9/25 with recommendations for CABG evaluation.    Assessment/Plan:   Principal Problem:   Unstable angina (HCC) Active Problems:   Type 2 diabetes mellitus with vascular disease (HCC)   Normocytic anemia   Essential hypertension   CAD (coronary artery disease)   Chronic diastolic CHF (congestive heart failure) (HCC)   PAF (paroxysmal atrial fibrillation) (HCC)   CKD (chronic kidney disease), stage III (HCC)   Abnormal CXR  1. severe left main and 3 vessel CAD - per cards:  consult CT surgery for CABG. Plavix will be held. Resume IV heparin. Will check Echo for LV function.   2. CKD stage III  - SCr is 1.54 on admission, similar to priors (patient was referred by PCP to nephrology but declined-- will need new referral) - Renally-dose medications    3. Paroxysmal atrial fibrillation  - In sinus rhythm on admission  - CHADS-VASc 6 (age x2, CAD, CHF, HTN, DM)  - Taking Eliquis prior to admission, IV heparin infusion ordered from ED  - Continue amiodarone   4. Chronic diastolic CHF  - Preserved EF on echo from January 2019  - Appears compensated  - Continue Lasix and beta-blocker, follow daily wt and I/O's    5. Type II DM  - A1c was 6.3% last year  - Managed at home  with metformin, held on admission  - Check CBG's and use a low-intensity SSI with Novolog while in hospital   6. Hypertension  - BP at goal  - Continue Lopressor as tolerated    7. Normocytic anemia  - Hgb is stable at 10.0 on admission with no active bleeding  - Likely secondary to chronic disease    8. CXR abnormality  - Prominent right paratracheal stripe noted on CXR with radiology recommending contrast-enhanced CT for r/o mass or adenopathy  - With renal insufficiency and unstable angina that got cardiac cath this admission, will avoid IV contrast for now but this may impact CVTS evaluation  9. Hyponatremia -? Related to lung finding vs IVF -trend with labs in aM   Family Communication/Anticipated D/C date and plan/Code Status   DVT prophylaxis: heparin gtt Code Status: Full Code.  Family Communication: wife at bedside Disposition Plan: pending work up   Medical Consultants:    Cards  cvts     Subjective:   Cough better-- hycodan worked  Objective:    Vitals:   07/27/18 1330 07/27/18 1400 07/27/18 1430 07/27/18 1500  BP: (!) 121/44 (!) 142/51 (!) 133/47 (!) 135/45  Pulse: 65 66 69 70  Resp: 18 14 14 15   Temp:      TempSrc:      SpO2: 98% 95% 100% 100%  Weight:      Height:  Intake/Output Summary (Last 24 hours) at 07/27/2018 1619 Last data filed at 07/27/2018 0546 Gross per 24 hour  Intake 370.45 ml  Output -  Net 370.45 ml   Filed Weights   07/26/18 0318 07/26/18 1303 07/27/18 0500  Weight: 85.5 kg 85 kg 85 kg    Exam: In bed, NAD rrr No increased work of breathing +BS, soft  Data Reviewed:   I have personally reviewed following labs and imaging studies:  Labs: Labs show the following:   Basic Metabolic Panel: Recent Labs  Lab 07/24/18 1118 07/26/18 0055 07/26/18 0804 07/27/18 0939  NA 133* 135 133* 129*  K 4.3 4.8 4.1 4.2  CL 102 102 99 94*  CO2 22 23 25 25   GLUCOSE 200* 128* 97 96  BUN 18 16 15 15     CREATININE 1.61* 1.54* 1.43* 1.43*  CALCIUM 9.0 9.4 9.2 9.0   GFR Estimated Creatinine Clearance: 46.1 mL/min (A) (by C-G formula based on SCr of 1.43 mg/dL (H)). Liver Function Tests: No results for input(s): AST, ALT, ALKPHOS, BILITOT, PROT, ALBUMIN in the last 168 hours. No results for input(s): LIPASE, AMYLASE in the last 168 hours. No results for input(s): AMMONIA in the last 168 hours. Coagulation profile Recent Labs  Lab 07/26/18 0055  INR 1.31    CBC: Recent Labs  Lab 07/24/18 1118 07/26/18 0055 07/27/18 0531  WBC 10.1 7.2 7.0  NEUTROABS  --  4.7  --   HGB 10.4* 10.0* 10.5*  HCT 32.1* 31.2* 32.8*  MCV 93.0 94.0 92.1  PLT 183 187 206   Cardiac Enzymes: Recent Labs  Lab 07/26/18 0228 07/26/18 0804 07/26/18 1401  TROPONINI 0.04* 0.03* <0.03   BNP (last 3 results) No results for input(s): PROBNP in the last 8760 hours. CBG: Recent Labs  Lab 07/26/18 1945 07/27/18 0200 07/27/18 0454 07/27/18 0754 07/27/18 1211  GLUCAP 149* 92 83 87 86   D-Dimer: No results for input(s): DDIMER in the last 72 hours. Hgb A1c: No results for input(s): HGBA1C in the last 72 hours. Lipid Profile: No results for input(s): CHOL, HDL, LDLCALC, TRIG, CHOLHDL, LDLDIRECT in the last 72 hours. Thyroid function studies: No results for input(s): TSH, T4TOTAL, T3FREE, THYROIDAB in the last 72 hours.  Invalid input(s): FREET3 Anemia work up: No results for input(s): VITAMINB12, FOLATE, FERRITIN, TIBC, IRON, RETICCTPCT in the last 72 hours. Sepsis Labs: Recent Labs  Lab 07/24/18 1118 07/26/18 0055 07/27/18 0531  WBC 10.1 7.2 7.0    Microbiology No results found for this or any previous visit (from the past 240 hour(s)).  Procedures and diagnostic studies:  Dg Chest 2 View  Result Date: 07/26/2018 CLINICAL DATA:  Chest pain EXAM: CHEST - 2 VIEW COMPARISON:  07/24/2018 chest radiograph. FINDINGS: Normal heart size. Prominence of the right paratracheal stripe/right  superior hilar contour. Aortic atherosclerosis. No pneumothorax. No pleural effusion. Hyperinflated lungs. IMPRESSION: Prominence of the right paratracheal stripe/right suprahilar contour. Recommend chest CT with IV contrast to exclude adenopathy/lung mass. Hyperinflated lungs, suggesting COPD. Electronically Signed   By: Ilona Sorrel M.D.   On: 07/26/2018 01:14    Medications:   . [MAR Hold] amiodarone  200 mg Oral Daily  . [MAR Hold] atorvastatin  20 mg Oral q1800  . [MAR Hold] chlorpheniramine-HYDROcodone  5 mL Oral Q12H  . [MAR Hold] cholecalciferol  1,000 Units Oral QPM  . [MAR Hold] clopidogrel  75 mg Oral Daily  . [MAR Hold] insulin aspart  0-9 Units Subcutaneous Q4H  . [MAR Hold] isosorbide  mononitrate  60 mg Oral Daily  . [MAR Hold] metoprolol tartrate  25 mg Oral BID  . [MAR Hold] multivitamin with minerals  1 tablet Oral QHS  . sodium chloride flush  3 mL Intravenous Q12H   Continuous Infusions: . sodium chloride    . sodium chloride 50 mL/hr at 07/27/18 0546  . sodium chloride    . heparin       LOS: 0 days   Geradine Girt  [facility]   *Please refer to [email or web address], password [email or web address] to get updated schedule on who will round on this patient, as hospitalists switch teams weekly. If 7PM-7AM, please contact night-coverage at [email or web address], password [email or web address] for any overnight needs.  07/27/2018, 4:19 PM

## 2018-07-27 NOTE — Progress Notes (Signed)
Bilateral Lower Extremity Great Saphenous  Vein Mapping:     [place] Terrisa Curfman (RDMS RVT) 07/27/18 5:43 PM

## 2018-07-27 NOTE — Progress Notes (Addendum)
Progress Note  Patient Name: Gregory Bates Date of Encounter: 07/27/2018  Primary Cardiologist: Lauree Chandler, MD  Subjective   Pt doing well. Denies chest pain or palpitations. Anticipating cardiac catheterization today.   Inpatient Medications    Scheduled Meds: . amiodarone  200 mg Oral Daily  . atorvastatin  20 mg Oral q1800  . chlorpheniramine-HYDROcodone  5 mL Oral Q12H  . cholecalciferol  1,000 Units Oral QPM  . clopidogrel  75 mg Oral Daily  . insulin aspart  0-9 Units Subcutaneous Q4H  . isosorbide mononitrate  60 mg Oral Daily  . metoprolol tartrate  25 mg Oral BID  . multivitamin with minerals  1 tablet Oral QHS  . sodium chloride flush  3 mL Intravenous Q12H   Continuous Infusions: . sodium chloride    . sodium chloride 50 mL/hr at 07/27/18 0546  . heparin 850 Units/hr (07/26/18 2228)   PRN Meds: sodium chloride, acetaminophen, morphine injection, nitroGLYCERIN, ondansetron (ZOFRAN) IV, sodium chloride flush   Vital Signs    Vitals:   07/26/18 2224 07/26/18 2229 07/27/18 0500 07/27/18 0531  BP: 138/67   135/63  Pulse: 64 75  65  Resp: 16   16  Temp: 98.6 F (37 C)   98.6 F (37 C)  TempSrc: Oral     SpO2: 98%   97%  Weight:   85 kg   Height:        Intake/Output Summary (Last 24 hours) at 07/27/2018 0916 Last data filed at 07/27/2018 0546 Gross per 24 hour  Intake 454.1 ml  Output -  Net 454.1 ml   Filed Weights   07/26/18 0318 07/26/18 1303 07/27/18 0500  Weight: 85.5 kg 85 kg 85 kg    Physical Exam   General: Well developed, well nourished, NAD Skin: Warm, dry, intact  Head: Normocephalic, atraumatic, clear, moist mucus membranes. Neck: Negative for carotid bruits. No JVD Lungs:Clear to ausculation bilaterally. No wheezes, rales, or rhonchi. Breathing is unlabored. Cardiovascular: RRR with S1 S2. No murmurs, rubs, gallops, or LV heave appreciated. Abdomen: Soft, non-tender, non-distended with normoactive bowel sounds. No  obvious abdominal masses. MSK: Strength and tone appear normal for age. 5/5 in all extremities Extremities: No edema. No clubbing or cyanosis. DP/PT pulses 2+ bilaterally Neuro: Alert and oriented. No focal deficits. No facial asymmetry. MAE spontaneously. Psych: Responds to questions appropriately with normal affect.    Labs    Chemistry Recent Labs  Lab 07/24/18 1118 07/26/18 0055 07/26/18 0804  NA 133* 135 133*  K 4.3 4.8 4.1  CL 102 102 99  CO2 22 23 25   GLUCOSE 200* 128* 97  BUN 18 16 15   CREATININE 1.61* 1.54* 1.43*  CALCIUM 9.0 9.4 9.2  GFRNONAA 40* 42* 46*  GFRAA 46* 48* 53*  ANIONGAP 9 10 9      Hematology Recent Labs  Lab 07/24/18 1118 07/26/18 0055 07/27/18 0531  WBC 10.1 7.2 7.0  RBC 3.45* 3.32* 3.56*  HGB 10.4* 10.0* 10.5*  HCT 32.1* 31.2* 32.8*  MCV 93.0 94.0 92.1  MCH 30.1 30.1 29.5  MCHC 32.4 32.1 32.0  RDW 13.2 13.2 13.0  PLT 183 187 206    Cardiac Enzymes Recent Labs  Lab 07/26/18 0228 07/26/18 0804 07/26/18 1401  TROPONINI 0.04* 0.03* <0.03    Recent Labs  Lab 07/24/18 1127 07/26/18 0057  TROPIPOC 0.00 0.03     BNP Recent Labs  Lab 07/24/18 1118  BNP 121.9*     DDimer No results  for input(s): DDIMER in the last 168 hours.   Radiology    Dg Chest 2 View  Result Date: 07/26/2018 CLINICAL DATA:  Chest pain EXAM: CHEST - 2 VIEW COMPARISON:  07/24/2018 chest radiograph. FINDINGS: Normal heart size. Prominence of the right paratracheal stripe/right superior hilar contour. Aortic atherosclerosis. No pneumothorax. No pleural effusion. Hyperinflated lungs. IMPRESSION: Prominence of the right paratracheal stripe/right suprahilar contour. Recommend chest CT with IV contrast to exclude adenopathy/lung mass. Hyperinflated lungs, suggesting COPD. Electronically Signed   By: Ilona Sorrel M.D.   On: 07/26/2018 01:14   Telemetry    07/27/18 NSR with HR 80's- Personally Reviewed  ECG    No new tracings as of 07/27/2018- Personally  Reviewed  Cardiac Studies   Cardiac catheterization 05/2017: 1. 3 vessel obstructive CAD - 40% distal left main - 50% ostial LAD. The LAD is relatively small. - diffuse 70% mid first diagonal. This is a large branch - 75% mid OM 1. Stent in the proximal vessel is still patent - 80% distal LCx after OM1 - 60% proximal RCA, stents in the mid and distal RCA are patent - 90% PDA 2. Moderate LV dysfunction with inferior HK. EF 40-45% 3. Normal LVEDP  Plan: compared to prior cath in 2016 there is no significant change. He has 3 vessel diffuse disease without clear focal lesion suitable for PCI. PCI of the PDA would be very challenging due to severe calcification, tortuosity and multiple prior stents in RCA. Will discuss with Dr. Angelena Form.  Echocardiogram 11/03/2017: -Performed at [facility] document with LVEF noted to be 55-60 with mild to moderate mitral regurgitation  Patient Profile     77 y.o. male with a PMH of diffuse moderate CAD s/p STEMI in 2013 and NSTEMI 2014 w PCI to RCA and OM1, chronic diastolic CHF, paroxysmal atrial fibrillation on Eliquis, HTN, HLD, DM type 2, CKD stage 3, who is being seen today for the evaluation of chest pain at the request of Dr. Evangeline Gula.  Assessment & Plan    1.  Unstable angina with known moderate multivessel CAD with previous PCI/DES to RCA and OM1: -Patient presented with 2-3 day history of chest pain relieved with nitroglycerin with mildly elevated however flat troponin at 0.04, 0.03, 0.03 -EKG without acute ischemic changes -Last L HC in 05/2017 with diffuse moderate disease without focal lesion suitable for PCI -Last echocardiogram 11/2017 with LVEF of 55 to 60% -Plans for definitive ischemic evaluation with cardiac catheterization today 07/27/2018 -Continue heparin gtt>>> was on Eliquis for anticoagulation secondary to paroxysmal atrial fibrillation>> currently on hold for planned procedure -Continue Plavix,  statin, and beta-blocker adjustments post procedure as needed  2.  Chronic diastolic CHF: -CXR on admission without pulmonary edema -Does not appear fluid volume overload on exam -Lasix and lisinopril currently on hold for planned cardiac cath  3.  Paroxysmal atrial fibrillation: -Stable, retaining sinus rhythm this admission -Continue amiodarone 200 mg daily, metoprolol 25 mg twice daily for rate/rhythm control -Eliquis on hold in anticipation of [facility] today, 07/27/2018 -CHA2DS2VASc = 6 (CHF, HTN, DM, vascular, age)  86.  HTN: -Stable, 135/63, 138/67, 117/62 -Continue metoprolol 25 mg twice daily -Lisinopril, Lasix and Imdur on hold for now>> given plan cardiac cath  5.  HLD: -Recent lipids on file -Last LDL in 2015, 103 -Continue statin  6.  DM2: -Last hemoglobin A1c, 6.312 2018 with goal of <7 -SSI for glucose control while inpatient status per primary team  7.  Abnormal CXR: -Recommendations for chest  CT with contrast to further evaluate adenopathy/mass -We will need chest CT with per primary team after further ischemic evaluation  8.  CKD stage III: -Creatinine, 1.43 on 07/26/2018>> repeat BMET  -Avoid nephrotoxic medications -Lisinopril and Lasix on hold secondary to elevated renal function and planned cardiac cath   SignedKathyrn Drown NP-C [facility] Pager: [phone] 07/27/2018, 9:16 AM     For questions or updates, please contact   Please consult [email or web address] for contact info under Cardiology/STEMI.  Personally seen and examined. Agree with above.  Cath reveals severe progression.  Mid LM to Dist LM lesion is 80% stenosed.  Dist LM to Ost LAD lesion is 90% stenosed.  Ost 1st Diag to 1st Diag lesion is 80% stenosed.  Previously placed Ost Ramus to Ramus stent (unknown type) is widely patent.  Ramus lesion is 80% stenosed.  Ost Cx to Prox Cx lesion is 99% stenosed.  Previously placed Prox RCA to Mid RCA stent (unknown type) is widely  patent.  Previously placed Dist RCA stent (unknown type) is widely patent.  Prox RCA lesion is 70% stenosed.  RPDA lesion is 90% stenosed.  LV end diastolic pressure is normal.   1. Severe left main and 3 vessel obstructive CAD. The left main and ostial LAD have progressed significantly from prior procedure 2. Normal LVEDP  Plan: will consult CT surgery for CABG. Plavix will be held. Resume IV heparin. Will check Echo for LV function.  PE: RRR. CTAB soft abd, no edema Labs: Creat 1.4  Plan: TCTS consult has been called. Holding Plavix.   Candee Furbish, MD

## 2018-07-27 NOTE — Progress Notes (Signed)
Site area: Right groin a 5 french arterial sheath was removed by Lorrin Jackson RN  Site Prior to Removal:  Level 0  Pressure Applied For 20 MINUTES    Bedrest  Beginning at 1245p  Manual:   Yes.    Patient Status During Pull:  stable  Post Pull Groin Site:  Level 0  Post Pull Instructions Given:  Yes.    Post Pull Pulses Present:  Yes.    Dressing Applied:  Yes.    Comments:  VS remain stable

## 2018-07-28 ENCOUNTER — Inpatient Hospital Stay (HOSPITAL_COMMUNITY): Payer: BLUE CROSS/BLUE SHIELD

## 2018-07-28 ENCOUNTER — Ambulatory Visit (HOSPITAL_COMMUNITY): Payer: BLUE CROSS/BLUE SHIELD

## 2018-07-28 ENCOUNTER — Encounter (HOSPITAL_COMMUNITY): Payer: Self-pay | Admitting: Cardiology

## 2018-07-28 DIAGNOSIS — C3411 Malignant neoplasm of upper lobe, right bronchus or lung: Secondary | ICD-10-CM | POA: Diagnosis present

## 2018-07-28 DIAGNOSIS — I340 Nonrheumatic mitral (valve) insufficiency: Secondary | ICD-10-CM

## 2018-07-28 DIAGNOSIS — N289 Disorder of kidney and ureter, unspecified: Secondary | ICD-10-CM

## 2018-07-28 DIAGNOSIS — R918 Other nonspecific abnormal finding of lung field: Secondary | ICD-10-CM

## 2018-07-28 DIAGNOSIS — C3490 Malignant neoplasm of unspecified part of unspecified bronchus or lung: Secondary | ICD-10-CM

## 2018-07-28 HISTORY — DX: Malignant neoplasm of unspecified part of unspecified bronchus or lung: C34.90

## 2018-07-28 LAB — CBC
HEMATOCRIT: 31.3 % — AB (ref 39.0–52.0)
HEMOGLOBIN: 10.3 g/dL — AB (ref 13.0–17.0)
MCH: 30.1 pg (ref 26.0–34.0)
MCHC: 32.9 g/dL (ref 30.0–36.0)
MCV: 91.5 fL (ref 78.0–100.0)
Platelets: 186 10*3/uL (ref 150–400)
RBC: 3.42 MIL/uL — ABNORMAL LOW (ref 4.22–5.81)
RDW: 12.8 % (ref 11.5–15.5)
WBC: 7.3 10*3/uL (ref 4.0–10.5)

## 2018-07-28 LAB — BASIC METABOLIC PANEL
ANION GAP: 9 (ref 5–15)
BUN: 13 mg/dL (ref 8–23)
CO2: 24 mmol/L (ref 22–32)
Calcium: 9.1 mg/dL (ref 8.9–10.3)
Chloride: 95 mmol/L — ABNORMAL LOW (ref 98–111)
Creatinine, Ser: 1.29 mg/dL — ABNORMAL HIGH (ref 0.61–1.24)
GFR calc Af Amer: >60 mL/min (ref >60)
GFR, EST NON AFRICAN AMERICAN: 52 mL/min — AB (ref >60)
GLUCOSE: 91 mg/dL (ref 70–99)
Potassium: 4.7 mmol/L (ref 3.5–5.1)
SODIUM: 128 mmol/L — AB (ref 135–145)

## 2018-07-28 LAB — ECHOCARDIOGRAM COMPLETE
HEIGHTINCHES: 71 in
Weight: 3017.6 oz

## 2018-07-28 LAB — GLUCOSE, CAPILLARY
Glucose-Capillary: 94 mg/dL (ref 70–99)
Glucose-Capillary: 98 mg/dL (ref 70–99)

## 2018-07-28 LAB — APTT
aPTT: 51 seconds — ABNORMAL HIGH (ref 24–36)
aPTT: 53 seconds — ABNORMAL HIGH (ref 24–36)

## 2018-07-28 LAB — HEPARIN LEVEL (UNFRACTIONATED): Heparin Unfractionated: 0.79 IU/mL — ABNORMAL HIGH (ref 0.30–0.70)

## 2018-07-28 LAB — HEMOGLOBIN A1C
HEMOGLOBIN A1C: 6.4 % — AB (ref 4.8–5.6)
Mean Plasma Glucose: 136.98 mg/dL

## 2018-07-28 MED ORDER — FLUTICASONE PROPIONATE 50 MCG/ACT NA SUSP
2.0000 | Freq: Two times a day (BID) | NASAL | Status: DC
  Administered 2018-07-28 – 2018-08-06 (×14): 2 via NASAL
  Filled 2018-07-28 (×2): qty 16

## 2018-07-28 MED ORDER — LORATADINE 10 MG PO TABS
10.0000 mg | ORAL_TABLET | Freq: Every day | ORAL | Status: DC
  Administered 2018-07-28 – 2018-08-06 (×9): 10 mg via ORAL
  Filled 2018-07-28 (×9): qty 1

## 2018-07-28 MED ORDER — SODIUM CHLORIDE 1 G PO TABS
1.0000 g | ORAL_TABLET | Freq: Three times a day (TID) | ORAL | Status: DC
  Administered 2018-07-28 – 2018-07-29 (×2): 1 g via ORAL
  Filled 2018-07-28 (×4): qty 1

## 2018-07-28 MED FILL — Verapamil HCl IV Soln 2.5 MG/ML: INTRAVENOUS | Qty: 2 | Status: AC

## 2018-07-28 NOTE — Progress Notes (Signed)
Progress Note  Patient Name: Gregory Bates Date of Encounter: 07/28/2018  Primary Cardiologist: Lauree Chandler, MD   Subjective   No significant chest pain, no significant shortness of breath  Inpatient Medications    Scheduled Meds: . amiodarone  200 mg Oral Daily  . atorvastatin  20 mg Oral q1800  . chlorpheniramine-HYDROcodone  5 mL Oral Q12H  . cholecalciferol  1,000 Units Oral QPM  . fluticasone  2 spray Each Nare BID  . isosorbide mononitrate  60 mg Oral Daily  . loratadine  10 mg Oral Daily  . metoprolol tartrate  25 mg Oral BID  . multivitamin with minerals  1 tablet Oral QHS  . sodium chloride flush  3 mL Intravenous Q12H  . sodium chloride  1 g Oral TID WC   Continuous Infusions: . sodium chloride    . heparin 850 Units/hr (07/28/18 0550)   PRN Meds: sodium chloride, acetaminophen, morphine injection, nitroGLYCERIN, ondansetron (ZOFRAN) IV, sodium chloride flush   Vital Signs    Vitals:   07/27/18 2320 07/28/18 0500 07/28/18 0538 07/28/18 1046  BP: 117/70  123/62   Pulse: 63  (!) 55   Resp:    16  Temp:   98.3 F (36.8 C)   TempSrc:   Oral   SpO2:   98%   Weight:  85.5 kg    Height:        Intake/Output Summary (Last 24 hours) at 07/28/2018 1058 Last data filed at 07/28/2018 0700 Gross per 24 hour  Intake 375.28 ml  Output 300 ml  Net 75.28 ml   Filed Weights   07/26/18 1303 07/27/18 0500 07/28/18 0500  Weight: 85 kg 85 kg 85.5 kg    Telemetry    No obvious abnormalities, no adverse arrhythmias- Personally Reviewed  ECG    Sinus bradycardia with sinus arrhythmia- Personally Reviewed  Physical Exam   GEN: No acute distress.   Neck: No JVD Cardiac: RRR, no murmurs, rubs, or gallops.  Respiratory: Clear to auscultation bilaterally. GI: Soft, nontender, non-distended  MS: No edema; No deformity. Neuro:  Nonfocal  Psych: Normal affect   Labs    Chemistry Recent Labs  Lab 07/26/18 0804 07/27/18 0939 07/28/18 0408  NA  133* 129* 128*  K 4.1 4.2 4.7  CL 99 94* 95*  CO2 25 25 24   GLUCOSE 97 96 91  BUN 15 15 13   CREATININE 1.43* 1.43* 1.29*  CALCIUM 9.2 9.0 9.1  GFRNONAA 46* 46* 52*  GFRAA 53* 53* >60  ANIONGAP 9 10 9      Hematology Recent Labs  Lab 07/26/18 0055 07/27/18 0531 07/28/18 0408  WBC 7.2 7.0 7.3  RBC 3.32* 3.56* 3.42*  HGB 10.0* 10.5* 10.3*  HCT 31.2* 32.8* 31.3*  MCV 94.0 92.1 91.5  MCH 30.1 29.5 30.1  MCHC 32.1 32.0 32.9  RDW 13.2 13.0 12.8  PLT 187 206 186    Cardiac Enzymes Recent Labs  Lab 07/26/18 0228 07/26/18 0804 07/26/18 1401  TROPONINI 0.04* 0.03* <0.03    Recent Labs  Lab 07/24/18 1127 07/26/18 0057  TROPIPOC 0.00 0.03     BNP Recent Labs  Lab 07/24/18 1118  BNP 121.9*     DDimer No results for input(s): DDIMER in the last 168 hours.   Radiology    Ct Chest Wo Contrast  Result Date: 07/27/2018 CLINICAL DATA:  Evaluate right paratracheal stripe/superior EXAM: CT CHEST WITHOUT CONTRAST TECHNIQUE: Multidetector CT imaging of the chest was performed following the standard protocol  without IV contrast. COMPARISON:  CXR 07/26/2018 FINDINGS: Cardiovascular: Atherosclerosis of the great vessels at their origins with conventional branch pattern. Moderate atherosclerosis of the distal aortic arch and descending aorta without aneurysm. Left main and three-vessel dense coronary arteriosclerosis. Heart size is normal without pericardial effusion. Mediastinum/Nodes: Mild thyromegaly with retroclavicular extension of the thyroid gland without dominant mass seen. Mediastinal adenopathy along the prevascular, right upper and lower paratracheal portions of the mediastinum, the largest estimated at 2.3 cm short axis along the right lower paratracheal portion given limitations of a noncontrast study. Mainstem bronchi and trachea are patent. Assessment of hilar adenopathy is somewhat limited due to lack of IV contrast but given new areas of subtle soft tissue prominence,  suspect that there is at least right-sided adenopathy since prior exam measuring up to 1.2 cm short axis. Lungs/Pleura: There is a partially necrotic appearing right paramediastinal soft tissue mass in the right upper lobe, the margins somewhat difficult to identify due to lack of IV contrast but is estimated as measuring approximately 3.5 x 3.4 x 3.2 cm, series 3/61 and series 6/40. Additionally, nodular opacities are noted along the major fissure in the posterior segment of right upper lobe, new since prior measuring up to 6 mm. Partially cavitary nodule in the right middle lobe is identified measuring up to 8 mm, series 4/72 with smaller sub 3 mm nodule, series 4/66. Dependent changes are noted along the posterior aspect of the right upper lobe. Small right effusion is present with sub pleural areas of streaky interstitial change likely atelectatic and less likely lymphangitic. Paraseptal and centrilobular emphysema is noted, upper lobe predominant with scarring at the apices. Mild peribronchial thickening and bronchiectasis is noted in the lower lobes. Upper Abdomen: No adrenal mass. No definite space-occupying mass of the included liver. Punctate calcification in the pole of the included left kidney. Musculoskeletal: No chest wall mass or suspicious bone lesions identified. IMPRESSION: 1. Partially necrotic masslike abnormality abutting the superior mediastinum within the medial right upper lobe measuring approximately 3.5 x 3.4 x 3.2 cm. This is associated with smaller nodular opacities in the right middle lobe and abutting the major fissure, the next largest is approximately 8 mm and partially cavitary in appearance in the right middle lobe. Pulmonary and/or cardiothoracic consultation is suggested. 2. There is associated mediastinal and right hilar lymphadenopathy as above described likely metastatic. 3. Small right pleural effusion. 4. Centrilobular and paraseptal emphysema. 5. Left main and three-vessel  coronary arteriosclerosis and aortic atherosclerosis. Aortic Atherosclerosis (ICD10-I70.0) and Emphysema (ICD10-J43.9). Electronically Signed   By: Ashley Royalty M.D.   On: 07/27/2018 23:29    Cardiac Studies   Advanced severe three-vessel coronary artery disease. Echocardiogram read here pending.  Previously EF normal with mild to moderate mitral regurgitation.  Patient Profile     77 y.o. male with severe advancement of coronary artery disease, paroxysmal H fibrillation with Eliquis on hold, diabetes with hypertension hyperlipidemia, chronic kidney disease stage III with abnormal chest x-ray, recommendations for chest CT which was ordered by Dr. Servando Snare and concerning for lung cancer.  Assessment & Plan    Severe coronary artery disease/lung pathology, possible cancer -Appreciate Dr. Servando Snare consultation.  CT scan worrisome for lung cancer, possibly stage III in conversation with Dr. Servando Snare.  Further evaluation will be necessary, PET.  Understanding his lung pathology very obviously will need to be done with concomitant consideration for coronary artery bypass grafting.  His mortality is quite high without bypass.  Paroxysmal atrial fibrillation - Amiodarone  200 mg a day, metoprolol 25 twice a day.  Eliquis currently on hold.  Heparin.  Hypertension hyperlipidemia, diabetes - Currently stable.  Lasix and lisinopril on hold with recent cardiac catheterization.  CKD 3, creatinine in the 1.4 range.  He states that his wife is sick at home with pneumonia and has no one to take care of her.  He is telling me that he wants to go home to help her.  I expressed to him that he is at high mortality.  I asked him wait to hear from Dr. Servando Snare before making any decisions.   For questions or updates, please contact [place] Please consult [email or web address] for contact info under        Signed, Candee Furbish, MD  07/28/2018, 10:58 AM

## 2018-07-28 NOTE — Progress Notes (Signed)
ANTICOAGULATION CONSULT NOTE - Follow Up Consult  Pharmacy Consult for heparin Indication: atrial fibrillation  Labs: Recent Labs    07/26/18 0055 07/26/18 0228 07/26/18 0804  07/26/18 1401 07/26/18 2125 07/27/18 0531 07/27/18 0939 07/28/18 0408  HGB 10.0*  --   --   --   --   --  10.5*  --  10.3*  HCT 31.2*  --   --   --   --   --  32.8*  --  31.3*  PLT 187  --   --   --   --   --  206  --  186  APTT  --   --   --    < > 125* 107* 75*  --  51*  LABPROT 16.2*  --   --   --   --   --   --   --   --   INR 1.31  --   --   --   --   --   --   --   --   HEPARINUNFRC  --   --   --   --  >2.20*  --  1.92*  --  0.79*  CREATININE 1.54*  --  1.43*  --   --   --   --  1.43*  --   TROPONINI  --  0.04* 0.03*  --  <0.03  --   --   --   --    < > = values in this interval not displayed.    Assessment/Plan:  77yo male subtherapeutic on heparin after resumed though was started late and likely needs more time to accumulate. Will continue gtt at current rate for now and check additional PTT.   Wynona Neat, PharmD, BCPS  07/28/2018,5:26 AM

## 2018-07-28 NOTE — Progress Notes (Signed)
[place] for heparin Indication: atrial fibrillation  Allergies  Allergen Reactions  . Brilinta [Ticagrelor] Other (See Comments)    Arthralgias & myalgias  . Crestor [Rosuvastatin] Other (See Comments)    Myalgias    Patient Measurements: Height: 5\' 11"  (180.3 cm) Weight: 188 lb 9.6 oz (85.5 kg) IBW/kg (Calculated) : 75.3  Vital Signs: Temp: 98.3 F (36.8 C) (09/26 0538) Temp Source: Oral (09/26 0538) BP: 123/62 (09/26 0538) Pulse Rate: 55 (09/26 0538)  Labs: Recent Labs    07/26/18 0055 07/26/18 0228 07/26/18 0804 07/26/18 1401  07/27/18 0531 07/27/18 0939 07/28/18 0408 07/28/18 0944  HGB 10.0*  --   --   --   --  10.5*  --  10.3*  --   HCT 31.2*  --   --   --   --  32.8*  --  31.3*  --   PLT 187  --   --   --   --  206  --  186  --   APTT  --   --   --  125*   < > 75*  --  51* 53*  LABPROT 16.2*  --   --   --   --   --   --   --   --   INR 1.31  --   --   --   --   --   --   --   --   HEPARINUNFRC  --   --   --  >2.20*  --  1.92*  --  0.79*  --   CREATININE 1.54*  --  1.43*  --   --   --  1.43* 1.29*  --   TROPONINI  --  0.04* 0.03* <0.03  --   --   --   --   --    < > = values in this interval not displayed.    Estimated Creatinine Clearance: 51.1 mL/min (A) (by C-G formula based on SCr of 1.29 mg/dL (H)).   Medical History: Past Medical History:  Diagnosis Date  . Acute renal insufficiency    a. During 11/2013 adm with atrial flutter.  . Anemia, unspecified   . CAD (coronary artery disease), native coronary artery    a. s/p cath May 2011 >> DES distal RCA;  b. 01/2013 NSTEMI >> OM1 99p (2.75x12 Promus Premier DES);  c. LHC (2/15): PCI: Promus DES to dist RCA ;  d. LHC 1/16 - dLM 45, oLAD 30, small D1 tandem 70, pOM1 stent ok, mOM 50-60, dOM bifurcation 50-60 in both vessels, AV 81-70 jailed by stent, pRCA 17 mRCA stent ok, dRCA stent ok, PLB smal 50, dPDA 60, 80; EF 35-40 >> med Rx - PCI of PDA if angina  . Chronic  combined systolic and diastolic CHF (congestive heart failure) ([place]) 12/23/2015   Echo 2/17: Mild LVH, EF 45-50%, inferior akinesis, grade 2 diastolic dysfunction, mild AI, mild MR, mild LAE, PASP 44 mmHg   . Colorectal polyps 2002  . Diabetes mellitus    AODM  . Erectile dysfunction   . History of echocardiogram    a. 01/2013 Echo: EF 55%, mid-dist inflat HK, Gr1 DD, Triv AI/TR, Mild MR.;  b.  Echo (1/15): Mild LVH, EF 55%, inferolateral hypokinesis, grade 1 diastolic dysfunction, mild AI, trivial MR, moderate LAE, PASP 36 mmHg   . Hx of cardiovascular stress test    Stress myoview 08/07/13 with LVEF 44%, inferior scar, no  ischemia  . Hyperlipidemia   . Hypertension   . Ischemic cardiomyopathy    a. EF 35-40% by LHC in 1/16; b. Echo 2/17: Mild LVH, EF 45-50%, inferior akinesis, grade 2 diastolic dysfunction, mild AI, mild MR, mild LAE, PASP 44 mmHg  . Myocardial infarction ([place])   . PAF (paroxysmal atrial fibrillation) ([place])    a. Dx 11/2013. Spont conv. Placed on eliquis.     Assessment: 77yo male c/o CP w/ mild SOB and productive cough, took NTG x2 at home w/ relief, admitted for further w/u, to transition from Eliquis (for Afib) to UFH; last dose of Eliquis taken PTA 9/23 at 1800.  Underwent cardiac cath 9/25- showing severe 3 vessel CAD. Heparin to continue while awaiting CABG evaluation.   Aptt still low this morning on 850 units/hr. Will adjust. CBC stable, no bleeding issues noted.   Goal of Therapy:  Heparin level 0.3-0.7 units/ml aPTT 66-102 seconds Monitor platelets by anticoagulation protocol: Yes   Plan:  Increase heparin infusion to 1050 units/hr  Check aPTT/HL in 8 hours Monitor daily aPTT / heparin level, CBC, s/s of bleed  Erin Hearing PharmD., BCPS Clinical Pharmacist 07/28/2018 11:56 AM

## 2018-07-28 NOTE — Plan of Care (Signed)
Cough and congestion. Monitor Labs and VS. PRN's ordered.

## 2018-07-28 NOTE — Progress Notes (Signed)
CARDIAC REHAB PHASE I   Pre-op education completed with pt. Pt given Cardiac Surgery booklet, [facility] Care Guide, and in-the tube sheet. Pt educated on importance of sternal precautions and walking post-op. Pt states his wife will be at home to help him after discharge. Pt given IS and demonstrated 1750, encouraged use while waiting for surgery. 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 Pt encouraged to have a restful weekend and limit walking to around the room. Will continue to follow throughout his stay.  [id number] Rufina Falco, RN BSN 07/28/2018 2:00 PM

## 2018-07-28 NOTE — Progress Notes (Signed)
PROGRESS NOTE    Gregory Bates  GXQ:[id number] DOB: 1940/12/31 DOA: 07/25/2018 PCP: Venetia Maxon, Sharon Mt, MD    Brief Narrative:  77 year old male who presented with chest pain.  He does have significant past medical history for diffuse coronary artery disease, diastolic heart failure, chronic kidney disease stage III, paroxysmal atrial fibrillation, hypertension and type 2 diabetes mellitus.  Patient complained of worsening exertional chest pain for the last 2 to 3 days, improved with nitroglycerin and sitting down.  On the day of admission he had recurrent chest pain that woke him up from his sleep, left-sided, precordial, associated with dyspnea, that prompted him to come to the emergency department.  On his initial physical examination blood pressure 127/57, heart rate 68, respiratory rate 14, temperature 98.4, oxygen saturation 95%, moist mucous membranes, lungs clear to auscultation bilaterally, heart S1-S2 present, no S3 or S4 gallop, abdomen protuberant, nontender, no lower extremity edema.  Sodium 133, potassium 4.1, chloride 99, bicarb 25, glucose 97, BUN 15, creatinine 1.43, troponin 0.03, white cell count 7.0, hemoglobin 10.5, hematocrit 32.8, platelets 206, chest x-ray with hyperinflation, no infiltrates, CT chest with a right upper lobe nodule.  EKG sinus rhythm, normal axis, normal intervals, no ST elevations or ST depressions, no significant T wave abnormalities.  Patient was admitted to the hospital with working diagnosis of unstable angina, acute coronary syndrome.  Assessment & Plan:   Principal Problem:   Unstable angina (HCC) Active Problems:   Type 2 diabetes mellitus with vascular disease (HCC)   Normocytic anemia   Essential hypertension   CAD (coronary artery disease)   Chronic diastolic CHF (congestive heart failure) (HCC)   PAF (paroxysmal atrial fibrillation) (HCC)   CKD (chronic kidney disease), stage III (HCC)   Abnormal CXR   1. Unstable angina/ acute  coronary syndrome. Currently patient is chest pain free. Clearly has angina symptoms on exertion, that seemed to get worse, cw unstable angina. Will continue IV heparin, b blockade and long acting nitrates. Patient will need coronary bypass grafting due to diffuse CAD. Scheduled for CABG next Monday, will continue to hold on clopidogrel. Patient is requesting to pospone surgery for now due to his wife sickness (pneumonia). Will follow with cardiothoracic surgery recommendations. Continue aspirin and atorvastatin, along with metoprolol.   2. Hyponatremia due to SIADH. Worsening hyponatremia, patient had recent work up (old records personally reviewed), high urinary sodium and urine osmolality, cw SIADH. Will liberate dietary salt, add 100 ml fluid restriction and add salt tablets. Patient is clinically euvolemic.   3. CKD stage 3. Stable renal function with serum cr at 1,29 with K at 4,7 and serum bicarbonate at 24. Will continue close follow up of renal function and electrolytes, avoid hypotension or nephrotoxic medications.   4. Paroxysmal atrial fibrillation. Will continue rate control with amiodarone and metoprolol, anticoagulation with IV heparin.    5. T2DM. Fasting glucose 96 and 91. Capillary glucose 121, 98, 94. Will hold on further insulin therapy for now.   6. Sinus congestion/ acute sinusitis. Will add flonase and loratadine.    DVT prophylaxis: heparin IV   Code Status:  full Family Communication: no family at the bedside  Disposition Plan/ discharge barriers: Patient will need CABG on this admission.    Consultants:   Cardiology   CT surgery   Procedures:     Antimicrobials:       Subjective: Patient with no chest pain as long as no exertion, no dyspnea, no nausea or vomiting. At home with  worsening angina symptoms. Positive sinus congestion and fullness, positive cough.   Objective: Vitals:   07/27/18 2320 07/28/18 0500 07/28/18 0538 07/28/18 1046  BP: 117/70   123/62   Pulse: 63  (!) 55   Resp:    16  Temp:   98.3 F (36.8 C)   TempSrc:   Oral   SpO2:   98%   Weight:  85.5 kg    Height:        Intake/Output Summary (Last 24 hours) at 07/28/2018 1248 Last data filed at 07/28/2018 0700 Gross per 24 hour  Intake 375.28 ml  Output 300 ml  Net 75.28 ml   Filed Weights   07/26/18 1303 07/27/18 0500 07/28/18 0500  Weight: 85 kg 85 kg 85.5 kg    Examination:   General: deconditioned  Neurology: Awake and alert, non focal  E ENT: mild pallor, no icterus, oral mucosa moist Cardiovascular: No JVD. S1-S2 present, rhythmic, no gallops, rubs, or murmurs. No lower extremity edema. Pulmonary: positive breath sounds bilaterally, adequate air movement, no wheezing, rhonchi or rales. Gastrointestinal. Abdomen protuberant with no organomegaly, non tender, no rebound or guarding Skin. No rashes Musculoskeletal: no joint deformities     Data Reviewed: I have personally reviewed following labs and imaging studies  CBC: Recent Labs  Lab 07/24/18 1118 07/26/18 0055 07/27/18 0531 07/28/18 0408  WBC 10.1 7.2 7.0 7.3  NEUTROABS  --  4.7  --   --   HGB 10.4* 10.0* 10.5* 10.3*  HCT 32.1* 31.2* 32.8* 31.3*  MCV 93.0 94.0 92.1 91.5  PLT 183 187 206 315   Basic Metabolic Panel: Recent Labs  Lab 07/24/18 1118 07/26/18 0055 07/26/18 0804 07/27/18 0939 07/28/18 0408  NA 133* 135 133* 129* 128*  K 4.3 4.8 4.1 4.2 4.7  CL 102 102 99 94* 95*  CO2 22 23 25 25 24   GLUCOSE 200* 128* 97 96 91  BUN 18 16 15 15 13   CREATININE 1.61* 1.54* 1.43* 1.43* 1.29*  CALCIUM 9.0 9.4 9.2 9.0 9.1   GFR: Estimated Creatinine Clearance: 51.1 mL/min (A) (by C-G formula based on SCr of 1.29 mg/dL (H)). Liver Function Tests: No results for input(s): AST, ALT, ALKPHOS, BILITOT, PROT, ALBUMIN in the last 168 hours. No results for input(s): LIPASE, AMYLASE in the last 168 hours. No results for input(s): AMMONIA in the last 168 hours. Coagulation Profile: Recent  Labs  Lab 07/26/18 0055  INR 1.31   Cardiac Enzymes: Recent Labs  Lab 07/26/18 0228 07/26/18 0804 07/26/18 1401  TROPONINI 0.04* 0.03* <0.03   BNP (last 3 results) No results for input(s): PROBNP in the last 8760 hours. HbA1C: Recent Labs    07/28/18 0408  HGBA1C 6.4*   CBG: Recent Labs  Lab 07/27/18 0754 07/27/18 1211 07/27/18 1941 07/27/18 2344 07/28/18 0531  GLUCAP 87 86 121* 98 94   Lipid Profile: No results for input(s): CHOL, HDL, LDLCALC, TRIG, CHOLHDL, LDLDIRECT in the last 72 hours. Thyroid Function Tests: No results for input(s): TSH, T4TOTAL, FREET4, T3FREE, THYROIDAB in the last 72 hours. Anemia Panel: No results for input(s): VITAMINB12, FOLATE, FERRITIN, TIBC, IRON, RETICCTPCT in the last 72 hours.    Radiology Studies: I have reviewed all of the imaging during this hospital visit personally     Scheduled Meds: . amiodarone  200 mg Oral Daily  . atorvastatin  20 mg Oral q1800  . chlorpheniramine-HYDROcodone  5 mL Oral Q12H  . cholecalciferol  1,000 Units Oral QPM  . fluticasone  2 spray Each Nare BID  . isosorbide mononitrate  60 mg Oral Daily  . loratadine  10 mg Oral Daily  . metoprolol tartrate  25 mg Oral BID  . multivitamin with minerals  1 tablet Oral QHS  . sodium chloride flush  3 mL Intravenous Q12H  . sodium chloride  1 g Oral TID WC   Continuous Infusions: . sodium chloride    . heparin 1,050 Units/hr (07/28/18 1225)     LOS: 1 day        Habib Kise Gerome Apley, MD [facility] Pager [phone]

## 2018-07-28 NOTE — Progress Notes (Signed)
2D Echocardiogram has been performed.  Gregory Bates 07/28/2018, 9:25 AM

## 2018-07-29 ENCOUNTER — Inpatient Hospital Stay (HOSPITAL_COMMUNITY): Payer: BLUE CROSS/BLUE SHIELD

## 2018-07-29 ENCOUNTER — Encounter (HOSPITAL_COMMUNITY): Payer: Self-pay | Admitting: Radiology

## 2018-07-29 DIAGNOSIS — E871 Hypo-osmolality and hyponatremia: Secondary | ICD-10-CM

## 2018-07-29 DIAGNOSIS — Z8639 Personal history of other endocrine, nutritional and metabolic disease: Secondary | ICD-10-CM

## 2018-07-29 LAB — BASIC METABOLIC PANEL
ANION GAP: 9 (ref 5–15)
BUN: 13 mg/dL (ref 8–23)
CHLORIDE: 92 mmol/L — AB (ref 98–111)
CO2: 23 mmol/L (ref 22–32)
Calcium: 8.9 mg/dL (ref 8.9–10.3)
Creatinine, Ser: 1.16 mg/dL (ref 0.61–1.24)
GFR, EST NON AFRICAN AMERICAN: 59 mL/min — AB (ref >60)
Glucose, Bld: 99 mg/dL (ref 70–99)
Potassium: 4.4 mmol/L (ref 3.5–5.1)
SODIUM: 124 mmol/L — AB (ref 135–145)

## 2018-07-29 LAB — CBC
HCT: 30.4 % — ABNORMAL LOW (ref 39.0–52.0)
HEMOGLOBIN: 10.2 g/dL — AB (ref 13.0–17.0)
MCH: 29.9 pg (ref 26.0–34.0)
MCHC: 33.6 g/dL (ref 30.0–36.0)
MCV: 89.1 fL (ref 78.0–100.0)
PLATELETS: 173 10*3/uL (ref 150–400)
RBC: 3.41 MIL/uL — AB (ref 4.22–5.81)
RDW: 12.6 % (ref 11.5–15.5)
WBC: 8 10*3/uL (ref 4.0–10.5)

## 2018-07-29 LAB — PULMONARY FUNCTION TEST
FEF 25-75 Pre: 0.87 L/sec
FEF2575-%Pred-Pre: 40 %
FEV1-%Pred-Pre: 50 %
FEV1-Pre: 1.55 L
FEV1FVC-%Pred-Pre: 77 %
FEV6-%Pred-Pre: 66 %
FEV6-Pre: 2.67 L
FEV6FVC-%Pred-Pre: 102 %
FVC-%Pred-Pre: 64 %
FVC-Pre: 2.78 L
Pre FEV1/FVC ratio: 56 %
Pre FEV6/FVC Ratio: 96 %

## 2018-07-29 LAB — APTT: APTT: 86 s — AB (ref 24–36)

## 2018-07-29 LAB — HEPARIN LEVEL (UNFRACTIONATED)
HEPARIN UNFRACTIONATED: 0.51 [IU]/mL (ref 0.30–0.70)
HEPARIN UNFRACTIONATED: 0.54 [IU]/mL (ref 0.30–0.70)

## 2018-07-29 MED ORDER — SODIUM CHLORIDE 1 G PO TABS
2.0000 g | ORAL_TABLET | Freq: Three times a day (TID) | ORAL | Status: DC
  Administered 2018-07-29 – 2018-07-31 (×8): 2 g via ORAL
  Filled 2018-07-29 (×10): qty 2

## 2018-07-29 NOTE — Progress Notes (Signed)
Patient ID: Gregory Bates, male   DOB: 11-24-1940, 77 y.o.   MRN: [id number]      [address]       [place] [address]             [phone]                 2 Days Post-Op Procedure(s) (LRB): LEFT HEART CATH AND CORONARY ANGIOGRAPHY (N/A)  LOS: 2 days   Subjective: Patient awake alert neurologically intact, remains on heparin drip, denies any chest pain, has had some coughing spells, especially with having screening PFTs done this morning  Objective: Vital signs in last 24 hours: Patient Vitals for the past 24 hrs:  BP Temp Temp src Pulse Resp SpO2 Weight  07/29/18 0826 (!) 156/67 - - 71 - - -  07/29/18 0500 108/65 98.5 F (36.9 C) Oral 60 - 99 % 84.9 kg  07/28/18 2151 - - - 63 - - -  07/28/18 2140 (!) 124/57 98.5 F (36.9 C) Oral 62 - 95 % -  07/28/18 1323 (!) 129/57 98.4 F (36.9 C) Oral 71 12 100 % -  07/28/18 1046 - - - - 16 - -    Filed Weights   07/27/18 0500 07/28/18 0500 07/29/18 0500  Weight: 85 kg 85.5 kg 84.9 kg    Hemodynamic parameters for last 24 hours:    Intake/Output from previous day: 09/26 0701 - 09/27 0700 In: 1502.3 [P.O.:1220; I.V.:282.3] Out: 1275 [Urine:1275] Intake/Output this shift: No intake/output data recorded.  Scheduled Meds: . amiodarone  200 mg Oral Daily  . atorvastatin  20 mg Oral q1800  . chlorpheniramine-HYDROcodone  5 mL Oral Q12H  . cholecalciferol  1,000 Units Oral QPM  . fluticasone  2 spray Each Nare BID  . isosorbide mononitrate  60 mg Oral Daily  . loratadine  10 mg Oral Daily  . metoprolol tartrate  25 mg Oral BID  . multivitamin with minerals  1 tablet Oral QHS  . sodium chloride flush  3 mL Intravenous Q12H  . sodium chloride  2 g Oral TID WC   Continuous Infusions: . sodium chloride    . heparin 1,050 Units/hr (07/28/18 2059)   PRN Meds:.sodium chloride, acetaminophen, morphine injection, nitroGLYCERIN, ondansetron (ZOFRAN) IV, sodium chloride flush  General appearance: alert,  cooperative and no distress Neurologic: intact Heart: regular rate and rhythm, S1, S2 normal, no murmur, click, rub or gallop Lungs: diminished breath sounds bibasilar Abdomen: soft, non-tender; bowel sounds normal; no masses,  no organomegaly Extremities: extremities normal, atraumatic, no cyanosis or edema and Homans sign is negative, no sign of DVT Wound: Right radial cath site intact  Lab Results: CBC: Recent Labs    07/28/18 0408 07/29/18 0226  WBC 7.3 8.0  HGB 10.3* 10.2*  HCT 31.3* 30.4*  PLT 186 173   BMET:  Recent Labs    07/28/18 0408 07/29/18 0226  NA 128* 124*  K 4.7 4.4  CL 95* 92*  CO2 24 23  GLUCOSE 91 99  BUN 13 13  CREATININE 1.29* 1.16  CALCIUM 9.1 8.9    PT/INR: No results for input(s): LABPROT, INR in the last 72 hours.   Radiology Ct Chest Wo Contrast  Result Date: 07/27/2018 CLINICAL DATA:  Evaluate right paratracheal stripe/superior EXAM: CT CHEST WITHOUT CONTRAST TECHNIQUE: Multidetector CT imaging of the chest was performed following the standard protocol without IV contrast. COMPARISON:  CXR 07/26/2018 FINDINGS: Cardiovascular: Atherosclerosis of the great vessels at their origins  with conventional branch pattern. Moderate atherosclerosis of the distal aortic arch and descending aorta without aneurysm. Left main and three-vessel dense coronary arteriosclerosis. Heart size is normal without pericardial effusion. Mediastinum/Nodes: Mild thyromegaly with retroclavicular extension of the thyroid gland without dominant mass seen. Mediastinal adenopathy along the prevascular, right upper and lower paratracheal portions of the mediastinum, the largest estimated at 2.3 cm short axis along the right lower paratracheal portion given limitations of a noncontrast study. Mainstem bronchi and trachea are patent. Assessment of hilar adenopathy is somewhat limited due to lack of IV contrast but given new areas of subtle soft tissue prominence, suspect that there is at  least right-sided adenopathy since prior exam measuring up to 1.2 cm short axis. Lungs/Pleura: There is a partially necrotic appearing right paramediastinal soft tissue mass in the right upper lobe, the margins somewhat difficult to identify due to lack of IV contrast but is estimated as measuring approximately 3.5 x 3.4 x 3.2 cm, series 3/61 and series 6/40. Additionally, nodular opacities are noted along the major fissure in the posterior segment of right upper lobe, new since prior measuring up to 6 mm. Partially cavitary nodule in the right middle lobe is identified measuring up to 8 mm, series 4/72 with smaller sub 3 mm nodule, series 4/66. Dependent changes are noted along the posterior aspect of the right upper lobe. Small right effusion is present with sub pleural areas of streaky interstitial change likely atelectatic and less likely lymphangitic. Paraseptal and centrilobular emphysema is noted, upper lobe predominant with scarring at the apices. Mild peribronchial thickening and bronchiectasis is noted in the lower lobes. Upper Abdomen: No adrenal mass. No definite space-occupying mass of the included liver. Punctate calcification in the pole of the included left kidney. Musculoskeletal: No chest wall mass or suspicious bone lesions identified. IMPRESSION: 1. Partially necrotic masslike abnormality abutting the superior mediastinum within the medial right upper lobe measuring approximately 3.5 x 3.4 x 3.2 cm. This is associated with smaller nodular opacities in the right middle lobe and abutting the major fissure, the next largest is approximately 8 mm and partially cavitary in appearance in the right middle lobe. Pulmonary and/or cardiothoracic consultation is suggested. 2. There is associated mediastinal and right hilar lymphadenopathy as above described likely metastatic. 3. Small right pleural effusion. 4. Centrilobular and paraseptal emphysema. 5. Left main and three-vessel coronary arteriosclerosis  and aortic atherosclerosis. Aortic Atherosclerosis (ICD10-I70.0) and Emphysema (ICD10-J43.9). Electronically Signed   By: Ashley Royalty M.D.   On: 07/27/2018 23:29     Assessment/Plan: S/P Procedure(s) (LRB): LEFT HEART CATH AND CORONARY ANGIOGRAPHY (N/A)  Patient with 36-month history of hyponatremia, now with evidence of mediastinal adenopathy, critical left main obstruction symptomatic. I reviewed with interventional cardiology the repeat angioplasty options for the patient but for technical reasons and anatomic reason stenting of his circumflex and LAD/left main is not possible. I discussed with the patient proceeding with coronary artery bypass grafting and atrial clipping and possible node biopsy, with further evaluation of mediastinal adenopathy after his cardiac situation is stabilized.  With the hyponatremia and mediastinal adenopathy-small cell lung cancer is highly likely.  We will plan on MRI of the brain today to rule out the possibility of brain mets before proceeding with coronary artery bypass grafting on Monday.  Patient will need fluid restriction to stabilize his sodium.    Grace Isaac MD 07/29/2018 10:30 AM

## 2018-07-29 NOTE — Progress Notes (Addendum)
PROGRESS NOTE    Gregory Bates  PYK:[id number] DOB: [birth year] DOA: 07/25/2018 PCP: Venetia Maxon, Sharon Mt, MD    Brief Narrative:  77 year old male who presented with chest pain.  He does have significant past medical history for diffuse coronary artery disease, diastolic heart failure, chronic kidney disease stage III, paroxysmal atrial fibrillation, hypertension and type 2 diabetes mellitus.  Patient complained of worsening exertional chest pain for the last 2 to 3 days, improved with nitroglycerin and sitting down.  On the day of admission he had recurrent chest pain that woke him up from his sleep, left-sided, precordial, associated with dyspnea, that prompted him to come to the emergency department.  On his initial physical examination blood pressure 127/57, heart rate 68, respiratory rate 14, temperature 98.4, oxygen saturation 95%, moist mucous membranes, lungs clear to auscultation bilaterally, heart S1-S2 present, no S3 or S4 gallop, abdomen protuberant, nontender, no lower extremity edema.  Sodium 133, potassium 4.1, chloride 99, bicarb 25, glucose 97, BUN 15, creatinine 1.43, troponin 0.03, white cell count 7.0, hemoglobin 10.5, hematocrit 32.8, platelets 206, chest x-ray with hyperinflation, no infiltrates, CT chest with a right upper lobe nodule.  EKG sinus rhythm, normal axis, normal intervals, no ST elevations or ST depressions, no significant T wave abnormalities.  Patient was admitted to the hospital with working diagnosis of unstable angina, acute coronary syndrome.   Assessment & Plan:   Principal Problem:   Unstable angina (HCC) Active Problems:   Type 2 diabetes mellitus with vascular disease (HCC)   Normocytic anemia   Essential hypertension   CAD (coronary artery disease)   Chronic diastolic CHF (congestive heart failure) (HCC)   PAF (paroxysmal atrial fibrillation) (HCC)   CKD (chronic kidney disease), stage III (HCC)   Abnormal CXR  1. Unstable angina/ acute  coronary syndrome. Patient will need CABG, continue medical therapy with IV heparin, b blockade and long acting nitrates. No active chest pain. Holding on clopidogrel.   2. Hyponatremia due to SIADH in the setting of right upper lobe lung nodule (3,5x3,4x3.2cm/ right middle lobe (63mm)/ possible lung cancer.  Continue worsening hyponatremia, today down to 124, will reinforce fluid restriction and will increase sodium tablets. Liberated diet for sodium. Suspected patient is not following fluid restrictions. Patient will get Brain MRI as part of workup.   3. CKD stage 3. Serum cr at 1,16 with K at 4,4 and serum bicarbonate at 23. Clinically patient is euvlemic, will continue close monitoring of electrolytes. Patient on fluid restriction 1000 ml per 24 hours.  4. Paroxysmal atrial fibrillation. On amiodarone and metoprolol, currently on simus rhythm. Continue anticoagulation with IV heparin.    5. T2DM. Patient off insulin therapy, glucose continue to be well controlled, with fasting measurements at 99 mg/dl.   6. Sinus congestion/ acute sinusitis. Continue flonase and loratadine.    DVT prophylaxis: heparin IV   Code Status:  full Family Communication: no family at the bedside  Disposition Plan/ discharge barriers: Patient will need CABG on this admission.    Consultants:   Cardiology   CT surgery   Procedures:     Antimicrobials:      Subjective: No significant physical activity, patient with no chest pain or dyspnea, no nausea or vomiting. Documented 1,500 ml intake, but patient reports only 2 cups of coffee and 1 cup of water.   Objective: Vitals:   07/28/18 1323 07/28/18 2140 07/28/18 2151 07/29/18 0500  BP: (!) 129/57 (!) 124/57  108/65  Pulse: 71 62 63 60  Resp: 12     Temp: 98.4 F (36.9 C) 98.5 F (36.9 C)  98.5 F (36.9 C)  TempSrc: Oral Oral  Oral  SpO2: 100% 95%  99%  Weight:    84.9 kg  Height:        Intake/Output Summary (Last 24 hours) at  07/29/2018 0759 Last data filed at 07/29/2018 0700 Gross per 24 hour  Intake 1502.3 ml  Output 1275 ml  Net 227.3 ml   Filed Weights   07/27/18 0500 07/28/18 0500 07/29/18 0500  Weight: 85 kg 85.5 kg 84.9 kg    Examination:   General: not in pain or dyspnea  Neurology: Awake and alert, non focal  E ENT: no pallor, no icterus, oral mucosa moist Cardiovascular: No JVD. S1-S2 present, rhythmic, no gallops, rubs, or murmurs. No lower extremity edema. Pulmonary: vesicular breath sounds bilaterally, adequate air movement, no wheezing, rhonchi or rales. Gastrointestinal. Abdomen with no organomegaly, non tender, no rebound or guarding Skin. No rashes Musculoskeletal: no joint deformities     Data Reviewed: I have personally reviewed following labs and imaging studies  CBC: Recent Labs  Lab 07/24/18 1118 07/26/18 0055 07/27/18 0531 07/28/18 0408 07/29/18 0226  WBC 10.1 7.2 7.0 7.3 8.0  NEUTROABS  --  4.7  --   --   --   HGB 10.4* 10.0* 10.5* 10.3* 10.2*  HCT 32.1* 31.2* 32.8* 31.3* 30.4*  MCV 93.0 94.0 92.1 91.5 89.1  PLT 183 187 206 186 456   Basic Metabolic Panel: Recent Labs  Lab 07/26/18 0055 07/26/18 0804 07/27/18 0939 07/28/18 0408 07/29/18 0226  NA 135 133* 129* 128* 124*  K 4.8 4.1 4.2 4.7 4.4  CL 102 99 94* 95* 92*  CO2 23 25 25 24 23   GLUCOSE 128* 97 96 91 99  BUN 16 15 15 13 13   CREATININE 1.54* 1.43* 1.43* 1.29* 1.16  CALCIUM 9.4 9.2 9.0 9.1 8.9   GFR: Estimated Creatinine Clearance: 56.8 mL/min (by C-G formula based on SCr of 1.16 mg/dL). Liver Function Tests: No results for input(s): AST, ALT, ALKPHOS, BILITOT, PROT, ALBUMIN in the last 168 hours. No results for input(s): LIPASE, AMYLASE in the last 168 hours. No results for input(s): AMMONIA in the last 168 hours. Coagulation Profile: Recent Labs  Lab 07/26/18 0055  INR 1.31   Cardiac Enzymes: Recent Labs  Lab 07/26/18 0228 07/26/18 0804 07/26/18 1401  TROPONINI 0.04* 0.03* <0.03    BNP (last 3 results) No results for input(s): PROBNP in the last 8760 hours. HbA1C: Recent Labs    07/28/18 0408  HGBA1C 6.4*   CBG: Recent Labs  Lab 07/27/18 0754 07/27/18 1211 07/27/18 1941 07/27/18 2344 07/28/18 0531  GLUCAP 87 86 121* 98 94   Lipid Profile: No results for input(s): CHOL, HDL, LDLCALC, TRIG, CHOLHDL, LDLDIRECT in the last 72 hours. Thyroid Function Tests: No results for input(s): TSH, T4TOTAL, FREET4, T3FREE, THYROIDAB in the last 72 hours. Anemia Panel: No results for input(s): VITAMINB12, FOLATE, FERRITIN, TIBC, IRON, RETICCTPCT in the last 72 hours.    Radiology Studies: I have reviewed all of the imaging during this hospital visit personally     Scheduled Meds: . amiodarone  200 mg Oral Daily  . atorvastatin  20 mg Oral q1800  . chlorpheniramine-HYDROcodone  5 mL Oral Q12H  . cholecalciferol  1,000 Units Oral QPM  . fluticasone  2 spray Each Nare BID  . isosorbide mononitrate  60 mg Oral Daily  . loratadine  10 mg Oral  Daily  . metoprolol tartrate  25 mg Oral BID  . multivitamin with minerals  1 tablet Oral QHS  . sodium chloride flush  3 mL Intravenous Q12H  . sodium chloride  1 g Oral TID WC   Continuous Infusions: . sodium chloride    . heparin 1,050 Units/hr (07/28/18 2059)     LOS: 2 days        Mauricio Gerome Apley, MD [facility] Pager [phone]

## 2018-07-29 NOTE — Progress Notes (Signed)
Progress Note  Patient Name: Gregory Bates Date of Encounter: 07/29/2018  Primary Cardiologist: Lauree Chandler, MD   Subjective   No significant chest pain, mild cough, no bleeding  Inpatient Medications    Scheduled Meds: . amiodarone  200 mg Oral Daily  . atorvastatin  20 mg Oral q1800  . chlorpheniramine-HYDROcodone  5 mL Oral Q12H  . cholecalciferol  1,000 Units Oral QPM  . fluticasone  2 spray Each Nare BID  . isosorbide mononitrate  60 mg Oral Daily  . loratadine  10 mg Oral Daily  . metoprolol tartrate  25 mg Oral BID  . multivitamin with minerals  1 tablet Oral QHS  . sodium chloride flush  3 mL Intravenous Q12H  . sodium chloride  2 g Oral TID WC   Continuous Infusions: . sodium chloride    . heparin 1,050 Units/hr (07/28/18 2059)   PRN Meds: sodium chloride, acetaminophen, morphine injection, nitroGLYCERIN, ondansetron (ZOFRAN) IV, sodium chloride flush   Vital Signs    Vitals:   07/28/18 2140 07/28/18 2151 07/29/18 0500 07/29/18 0826  BP: (!) 124/57  108/65 (!) 156/67  Pulse: 62 63 60 71  Resp:      Temp: 98.5 F (36.9 C)  98.5 F (36.9 C)   TempSrc: Oral  Oral   SpO2: 95%  99%   Weight:   84.9 kg   Height:        Intake/Output Summary (Last 24 hours) at 07/29/2018 1114 Last data filed at 07/29/2018 0700 Gross per 24 hour  Intake 1262.3 ml  Output 1275 ml  Net -12.7 ml   Filed Weights   07/27/18 0500 07/28/18 0500 07/29/18 0500  Weight: 85 kg 85.5 kg 84.9 kg    Telemetry    No obvious abnormalities, no adverse arrhythmias- Personally Reviewed  ECG    Sinus bradycardia with nonspecific ST-T wave changes- Personally Reviewed  Physical Exam   GEN: No acute distress.   Neck: No JVD Cardiac: RRR, no murmurs, rubs, or gallops.  Respiratory:  Mild wheeze heard bilaterally. GI: Soft, nontender, non-distended  MS: No edema; No deformity. Neuro:  Nonfocal  Psych: Normal affect   Labs    Chemistry Recent Labs  Lab  07/27/18 0939 07/28/18 0408 07/29/18 0226  NA 129* 128* 124*  K 4.2 4.7 4.4  CL 94* 95* 92*  CO2 25 24 23   GLUCOSE 96 91 99  BUN 15 13 13   CREATININE 1.43* 1.29* 1.16  CALCIUM 9.0 9.1 8.9  GFRNONAA 46* 52* 59*  GFRAA 53* >60 >60  ANIONGAP 10 9 9      Hematology Recent Labs  Lab 07/27/18 0531 07/28/18 0408 07/29/18 0226  WBC 7.0 7.3 8.0  RBC 3.56* 3.42* 3.41*  HGB 10.5* 10.3* 10.2*  HCT 32.8* 31.3* 30.4*  MCV 92.1 91.5 89.1  MCH 29.5 30.1 29.9  MCHC 32.0 32.9 33.6  RDW 13.0 12.8 12.6  PLT 206 186 173    Cardiac Enzymes Recent Labs  Lab 07/26/18 0228 07/26/18 0804 07/26/18 1401  TROPONINI 0.04* 0.03* <0.03    Recent Labs  Lab 07/24/18 1127 07/26/18 0057  TROPIPOC 0.00 0.03     BNP Recent Labs  Lab 07/24/18 1118  BNP 121.9*     DDimer No results for input(s): DDIMER in the last 168 hours.   Radiology    Ct Chest Wo Contrast  Result Date: 07/27/2018 CLINICAL DATA:  Evaluate right paratracheal stripe/superior EXAM: CT CHEST WITHOUT CONTRAST TECHNIQUE: Multidetector CT imaging of the chest  was performed following the standard protocol without IV contrast. COMPARISON:  CXR 07/26/2018 FINDINGS: Cardiovascular: Atherosclerosis of the great vessels at their origins with conventional branch pattern. Moderate atherosclerosis of the distal aortic arch and descending aorta without aneurysm. Left main and three-vessel dense coronary arteriosclerosis. Heart size is normal without pericardial effusion. Mediastinum/Nodes: Mild thyromegaly with retroclavicular extension of the thyroid gland without dominant mass seen. Mediastinal adenopathy along the prevascular, right upper and lower paratracheal portions of the mediastinum, the largest estimated at 2.3 cm short axis along the right lower paratracheal portion given limitations of a noncontrast study. Mainstem bronchi and trachea are patent. Assessment of hilar adenopathy is somewhat limited due to lack of IV contrast but  given new areas of subtle soft tissue prominence, suspect that there is at least right-sided adenopathy since prior exam measuring up to 1.2 cm short axis. Lungs/Pleura: There is a partially necrotic appearing right paramediastinal soft tissue mass in the right upper lobe, the margins somewhat difficult to identify due to lack of IV contrast but is estimated as measuring approximately 3.5 x 3.4 x 3.2 cm, series 3/61 and series 6/40. Additionally, nodular opacities are noted along the major fissure in the posterior segment of right upper lobe, new since prior measuring up to 6 mm. Partially cavitary nodule in the right middle lobe is identified measuring up to 8 mm, series 4/72 with smaller sub 3 mm nodule, series 4/66. Dependent changes are noted along the posterior aspect of the right upper lobe. Small right effusion is present with sub pleural areas of streaky interstitial change likely atelectatic and less likely lymphangitic. Paraseptal and centrilobular emphysema is noted, upper lobe predominant with scarring at the apices. Mild peribronchial thickening and bronchiectasis is noted in the lower lobes. Upper Abdomen: No adrenal mass. No definite space-occupying mass of the included liver. Punctate calcification in the pole of the included left kidney. Musculoskeletal: No chest wall mass or suspicious bone lesions identified. IMPRESSION: 1. Partially necrotic masslike abnormality abutting the superior mediastinum within the medial right upper lobe measuring approximately 3.5 x 3.4 x 3.2 cm. This is associated with smaller nodular opacities in the right middle lobe and abutting the major fissure, the next largest is approximately 8 mm and partially cavitary in appearance in the right middle lobe. Pulmonary and/or cardiothoracic consultation is suggested. 2. There is associated mediastinal and right hilar lymphadenopathy as above described likely metastatic. 3. Small right pleural effusion. 4. Centrilobular and  paraseptal emphysema. 5. Left main and three-vessel coronary arteriosclerosis and aortic atherosclerosis. Aortic Atherosclerosis (ICD10-I70.0) and Emphysema (ICD10-J43.9). Electronically Signed   By: Ashley Royalty M.D.   On: 07/27/2018 23:29    Cardiac Studies   Advanced severe three-vessel coronary artery disease. Echocardiogram read here pending.  Previously EF normal with mild to moderate mitral regurgitation.  Patient Profile     77 y.o. male with severe advancement of coronary artery disease, paroxysmal H fibrillation with Eliquis on hold, diabetes with hypertension hyperlipidemia, chronic kidney disease stage III with abnormal chest x-ray, recommendations for chest CT which was ordered by Dr. Servando Snare and concerning for lung cancer.  Assessment & Plan    Severe coronary artery disease/lung pathology, possible cancer -Appreciate Dr. Servando Snare consultation.  CT scan worrisome for lung cancer, possibly stage III.  His mortality is quite high without bypass.  In review of Dr. Everrett Coombe note, plan will be to get MRI today of the brain PFTs, and CABG Monday with nodal biopsy, left atrial appendage clipping.  Paroxysmal atrial fibrillation -  Amiodarone 200 mg a day, metoprolol 25 twice a day.  Eliquis currently on hold.  Heparin.  Hypertension hyperlipidemia, diabetes - Currently stable.  Lasix and lisinopril on hold with recent cardiac catheterization.  CKD 3, creatinine in the 1.4 range.  Currently 1.16  Hyponatremia -Likely from underlying lung cancer, fluid restriction order placed  For questions or updates, please contact [facility] Please consult [email or web address] for contact info under        Signed, Candee Furbish, MD  07/29/2018, 11:14 AM

## 2018-07-29 NOTE — Progress Notes (Signed)
[place] for Heparin (Apixaban on hold) Indication: atrial fibrillation  Allergies  Allergen Reactions  . Brilinta [Ticagrelor] Other (See Comments)    Arthralgias & myalgias  . Crestor [Rosuvastatin] Other (See Comments)    Myalgias    Patient Measurements: Height: 5\' 11"  (180.3 cm) Weight: 188 lb 9.6 oz (85.5 kg) IBW/kg (Calculated) : 75.3  Vital Signs: Temp: 98.5 F (36.9 C) (09/26 2140) Temp Source: Oral (09/26 2140) BP: 124/57 (09/26 2140) Pulse Rate: 63 (09/26 2151)  Labs: Recent Labs    07/26/18 0804  07/26/18 1401  07/27/18 0531 07/27/18 0939 07/28/18 0408 07/28/18 0944 07/29/18 0226  HGB  --   --   --    < > 10.5*  --  10.3*  --  10.2*  HCT  --   --   --   --  32.8*  --  31.3*  --  30.4*  PLT  --   --   --   --  206  --  186  --  173  APTT  --   --  125*   < > 75*  --  51* 53* 86*  HEPARINUNFRC  --    < > >2.20*  --  1.92*  --  0.79*  --  0.51  CREATININE 1.43*  --   --   --   --  1.43* 1.29*  --  1.16  TROPONINI 0.03*  --  <0.03  --   --   --   --   --   --    < > = values in this interval not displayed.    Estimated Creatinine Clearance: 56.8 mL/min (by C-G formula based on SCr of 1.16 mg/dL).   Medical History: Past Medical History:  Diagnosis Date  . Acute renal insufficiency    a. During 11/2013 adm with atrial flutter.  . Anemia, unspecified   . CAD (coronary artery disease), native coronary artery    a. s/p cath May 2011 >> DES distal RCA;  b. 01/2013 NSTEMI >> OM1 99p (2.75x12 Promus Premier DES);  c. LHC (2/15): PCI: Promus DES to dist RCA ;  d. LHC 1/16 - dLM 26, oLAD 30, small D1 tandem 75, pOM1 stent ok, mOM 50-60, dOM bifurcation 50-60 in both vessels, AV 49-70 jailed by stent, pRCA 59 mRCA stent ok, dRCA stent ok, PLB smal 50, dPDA 60, 80; EF 35-40 >> med Rx - PCI of PDA if angina  . Chronic combined systolic and diastolic CHF (congestive heart failure) ([place]) 12/23/2015   Echo 2/17: Mild LVH, EF  45-50%, inferior akinesis, grade 2 diastolic dysfunction, mild AI, mild MR, mild LAE, PASP 44 mmHg   . Colorectal polyps 2002  . Diabetes mellitus    AODM  . Erectile dysfunction   . History of echocardiogram    a. 01/2013 Echo: EF 55%, mid-dist inflat HK, Gr1 DD, Triv AI/TR, Mild MR.;  b.  Echo (1/15): Mild LVH, EF 55%, inferolateral hypokinesis, grade 1 diastolic dysfunction, mild AI, trivial MR, moderate LAE, PASP 36 mmHg   . Hx of cardiovascular stress test    Stress myoview 08/07/13 with LVEF 44%, inferior scar, no ischemia  . Hyperlipidemia   . Hypertension   . Ischemic cardiomyopathy    a. EF 35-40% by LHC in 1/16; b. Echo 2/17: Mild LVH, EF 45-50%, inferior akinesis, grade 2 diastolic dysfunction, mild AI, mild MR, mild LAE, PASP 44 mmHg  . Myocardial infarction ([place])   . PAF (  paroxysmal atrial fibrillation) ([place])    a. Dx 11/2013. Spont conv. Placed on eliquis.     Assessment: 77yo male c/o CP w/ mild SOB and productive cough, took NTG x2 at home w/ relief, admitted for further w/u, to transition from Eliquis (for Afib) to UFH; last dose of Eliquis taken PTA 9/23 at 1800.  Underwent cardiac cath 9/25- showing severe 3 vessel CAD. Heparin to continue while awaiting CABG evaluation.   Heparin level is therapeutic x 1 this AM, it is correlating with the aPTT this AM, will use heparin level only to dose from now on  Goal of Therapy:  Heparin level 0.3-0.7 units/mL Monitor platelets by anticoagulation protocol: Yes   Plan:  Cont heparin at 1050 units/hr 1000 HL to confirm  Narda Bonds, PharmD, [place] Pharmacist Phone: [phone]

## 2018-07-29 NOTE — Progress Notes (Signed)
[facility] for heparin Indication: atrial fibrillation  Allergies  Allergen Reactions  . Brilinta [Ticagrelor] Other (See Comments)    Arthralgias & myalgias  . Crestor [Rosuvastatin] Other (See Comments)    Myalgias    Patient Measurements: Height: 5\' 11"  (180.3 cm) Weight: 187 lb 3.2 oz (84.9 kg) IBW/kg (Calculated) : 75.3  Vital Signs: Temp: 98.1 F (36.7 C) (09/27 1145) Temp Source: Oral (09/27 1145) BP: 120/60 (09/27 1145) Pulse Rate: 55 (09/27 1145)  Labs: Recent Labs    07/26/18 1401  07/27/18 0531 07/27/18 0939 07/28/18 0408 07/28/18 0944 07/29/18 0226 07/29/18 0925  HGB  --    < > 10.5*  --  10.3*  --  10.2*  --   HCT  --   --  32.8*  --  31.3*  --  30.4*  --   PLT  --   --  206  --  186  --  173  --   APTT 125*   < > 75*  --  51* 53* 86*  --   HEPARINUNFRC >2.20*  --  1.92*  --  0.79*  --  0.51 0.54  CREATININE  --   --   --  1.43* 1.29*  --  1.16  --   TROPONINI <0.03  --   --   --   --   --   --   --    < > = values in this interval not displayed.    Estimated Creatinine Clearance: 56.8 mL/min (by C-G formula based on SCr of 1.16 mg/dL).   Medical History: Past Medical History:  Diagnosis Date  . Acute renal insufficiency    a. During 11/2013 adm with atrial flutter.  . Anemia, unspecified   . CAD (coronary artery disease), native coronary artery    a. s/p cath May 2011 >> DES distal RCA;  b. 01/2013 NSTEMI >> OM1 99p (2.75x12 Promus Premier DES);  c. LHC (2/15): PCI: Promus DES to dist RCA ;  d. LHC 1/16 - dLM 77, oLAD 30, small D1 tandem 63, pOM1 stent ok, mOM 50-60, dOM bifurcation 50-60 in both vessels, AV 39-70 jailed by stent, pRCA 15 mRCA stent ok, dRCA stent ok, PLB smal 50, dPDA 60, 80; EF 35-40 >> med Rx - PCI of PDA if angina  . Chronic combined systolic and diastolic CHF (congestive heart failure) ([place]) 12/23/2015   Echo 2/17: Mild LVH, EF 45-50%, inferior akinesis, grade 2 diastolic dysfunction, mild  AI, mild MR, mild LAE, PASP 44 mmHg   . Colorectal polyps 2002  . Diabetes mellitus    AODM  . Erectile dysfunction   . History of echocardiogram    a. 01/2013 Echo: EF 55%, mid-dist inflat HK, Gr1 DD, Triv AI/TR, Mild MR.;  b.  Echo (1/15): Mild LVH, EF 55%, inferolateral hypokinesis, grade 1 diastolic dysfunction, mild AI, trivial MR, moderate LAE, PASP 36 mmHg   . Hx of cardiovascular stress test    Stress myoview 08/07/13 with LVEF 44%, inferior scar, no ischemia  . Hyperlipidemia   . Hypertension   . Ischemic cardiomyopathy    a. EF 35-40% by LHC in 1/16; b. Echo 2/17: Mild LVH, EF 45-50%, inferior akinesis, grade 2 diastolic dysfunction, mild AI, mild MR, mild LAE, PASP 44 mmHg  . Myocardial infarction ([place])   . PAF (paroxysmal atrial fibrillation) ([place])    a. Dx 11/2013. Spont conv. Placed on eliquis.     Assessment: 77yo male c/o CP  w/ mild SOB and productive cough, took NTG x2 at home w/ relief, admitted for further w/u, to transition from Eliquis (for Afib) to UFH; last dose of Eliquis taken PTA 9/23 at 1800. He Underwent cardiac cath 9/25- showing severe 3 vessel CAD. Heparin to continue while awaiting CABG on 9/30   Goal of Therapy:  Heparin level 0.3-0.7 units/ml aPTT 66-102 seconds Monitor platelets by anticoagulation protocol: Yes   Plan:  -No heparin changes needed -Daily heparin level and CBC  Hildred Laser, PharmD Clinical Pharmacist Please check Amion for pharmacy contact number

## 2018-07-30 ENCOUNTER — Inpatient Hospital Stay (HOSPITAL_COMMUNITY): Payer: BLUE CROSS/BLUE SHIELD

## 2018-07-30 DIAGNOSIS — I10 Essential (primary) hypertension: Secondary | ICD-10-CM

## 2018-07-30 LAB — COMPREHENSIVE METABOLIC PANEL
ALT: 50 U/L — ABNORMAL HIGH (ref 0–44)
AST: 39 U/L (ref 15–41)
Albumin: 3.8 g/dL (ref 3.5–5.0)
Alkaline Phosphatase: 63 U/L (ref 38–126)
Anion gap: 10 (ref 5–15)
BUN: 12 mg/dL (ref 8–23)
CO2: 22 mmol/L (ref 22–32)
Calcium: 8.9 mg/dL (ref 8.9–10.3)
Chloride: 92 mmol/L — ABNORMAL LOW (ref 98–111)
Creatinine, Ser: 1.27 mg/dL — ABNORMAL HIGH (ref 0.61–1.24)
GFR calc Af Amer: >60 mL/min (ref >60)
GFR calc non Af Amer: 53 mL/min — ABNORMAL LOW (ref >60)
Glucose, Bld: 105 mg/dL — ABNORMAL HIGH (ref 70–99)
Potassium: 4.7 mmol/L (ref 3.5–5.1)
Sodium: 124 mmol/L — ABNORMAL LOW (ref 135–145)
Total Bilirubin: 0.7 mg/dL (ref 0.3–1.2)
Total Protein: 6.4 g/dL — ABNORMAL LOW (ref 6.5–8.1)

## 2018-07-30 LAB — HEPARIN LEVEL (UNFRACTIONATED): Heparin Unfractionated: 0.31 IU/mL (ref 0.30–0.70)

## 2018-07-30 LAB — CBC
HCT: 30.2 % — ABNORMAL LOW (ref 39.0–52.0)
HEMOGLOBIN: 10 g/dL — AB (ref 13.0–17.0)
MCH: 29.8 pg (ref 26.0–34.0)
MCHC: 33.1 g/dL (ref 30.0–36.0)
MCV: 89.9 fL (ref 78.0–100.0)
Platelets: 208 10*3/uL (ref 150–400)
RBC: 3.36 MIL/uL — AB (ref 4.22–5.81)
RDW: 12.8 % (ref 11.5–15.5)
WBC: 10.9 10*3/uL — ABNORMAL HIGH (ref 4.0–10.5)

## 2018-07-30 LAB — TROPONIN I
Troponin I: <0.03 ng/mL (ref <0.03)
Troponin I: <0.03 ng/mL (ref <0.03)
Troponin I: <0.03 ng/mL (ref <0.03)

## 2018-07-30 MED ORDER — NITROGLYCERIN IN D5W 200-5 MCG/ML-% IV SOLN
2.0000 ug/min | INTRAVENOUS | Status: DC
  Administered 2018-07-30: 5 ug/min via INTRAVENOUS
  Filled 2018-07-30: qty 250

## 2018-07-30 MED ORDER — ALUM & MAG HYDROXIDE-SIMETH 200-200-20 MG/5ML PO SUSP
30.0000 mL | ORAL | Status: DC | PRN
  Administered 2018-07-30 (×2): 30 mL via ORAL
  Filled 2018-07-30 (×2): qty 30

## 2018-07-30 MED ORDER — MORPHINE SULFATE (PF) 2 MG/ML IV SOLN
2.0000 mg | Freq: Once | INTRAVENOUS | Status: AC
  Administered 2018-07-30: 2 mg via INTRAVENOUS
  Filled 2018-07-30: qty 1

## 2018-07-30 MED ORDER — HYDROMORPHONE HCL 1 MG/ML IJ SOLN
1.0000 mg | Freq: Once | INTRAMUSCULAR | Status: AC
  Administered 2018-07-30: 1 mg via INTRAVENOUS
  Filled 2018-07-30: qty 1

## 2018-07-30 NOTE — Progress Notes (Signed)
Pt c/o severe chest pain. Administered nitro SL x3 and morphine with a little effect.  EKG is done.  Paged cardiology.  Idolina Primer, RN

## 2018-07-30 NOTE — Progress Notes (Addendum)
PROGRESS NOTE    Gregory Bates  XEN:[id number] DOB: 10-24-1941 DOA: 07/25/2018 PCP: Venetia Maxon, Sharon Mt, MD    Brief Narrative:  77 year old male who presented with chest pain. He does have significant past medical history for diffusecoronary artery disease, diastolic heart failure, chronic kidney disease stage III, paroxysmal atrial fibrillation, hypertension and type 2 diabetes mellitus. Patient complained of worsening exertional chest pain for the last 2 to 3 days, improved with nitroglycerin and sitting down.On the day of admission he had recurrent chest pain that woke him up from his sleep, left-sided, precordial, associated with dyspnea, that prompted him to come to the emergency department. On his initial physical examination blood pressure 127/57, heart rate 68, respiratory rate14, temperature 98.4, oxygen saturation 95%, moist mucous membranes, lungs clear to auscultation bilaterally, heart S1-S2 present, no S3 or S4 gallop, abdomen protuberant, nontender, no lower extremity edema. Sodium 133, potassium 4.1, chloride 99, bicarb 25, glucose 97, BUN 15, creatinine 1.43, troponin 0.03, white cell count 7.0, hemoglobin 10.5, hematocrit 32.8, platelets 206,chest x-ray with hyperinflation, no infiltrates, CT chest with a right upper lobe nodule. EKG sinus rhythm, normal axis, normal intervals, no ST elevations or ST depressions, no significant T wave abnormalities.  Patient was admitted to the hospital with working diagnosis of unstable angina,acute coronary syndrome.   Assessment & Plan:   Principal Problem:   Unstable angina (HCC) Active Problems:   Type 2 diabetes mellitus with vascular disease (HCC)   Normocytic anemia   Essential hypertension   CAD (coronary artery disease)   Chronic diastolic CHF (congestive heart failure) (HCC)   PAF (paroxysmal atrial fibrillation) (HCC)   CKD (chronic kidney disease), stage III (HCC)   Abnormal CXR   1. Unstable angina/ acute  coronary syndrome. Plan for CABG on this hospitalization. Continue  IV heparin, b blockade and long acting nitrates. Patient had recurrent symptoms while on MRI scanner. He has a very high risk for worsening ischemia. Holding clopidogrel. If recurrent pain may need nitroglycerin infusion.   2. Hyponatremia due to SIADH in the setting of right upper lobe lung nodule (3,5x3,4x3.2cm/ right middle lobe (46mm)/ possible lung cancer.  Serum sodium has been stable at 124, continue fluid restriction and tid 2 sodium tablets. Lung nodule, possible malignancy, may need tissue biopsy, likely after revascularization.   3. CKD stage 3. Serum cr at 1,27 with K at 4,7 and serum bicarbonate at 22. Will continue fluid restriction 1000 ml per 24 hours. Follow on renal panel in am.   4. Paroxysmal atrial fibrillation. Continue with amiodarone and metoprolol, for rate control, patient has remained on sinus rhythm. Anticoagulation with IV heparin.  5. T2DM. Glucose continue to be controlled, patient off insulin therapy, fasting glucose today at 105 mg/dl. Tolerating po well.   6. Sinus congestion/ acute sinusitis. On flonase and loratadine with improvement of his symptoms.   DVT prophylaxis:heparin IV Code Status:full Family Communication:no family at the bedside Disposition Plan/ discharge barriers:Pending CABG.    Consultants:  Cardiology   CT surgery  Procedures:    Antimicrobials:     Subjective: Patient unable to get brain MRI due to dyspnea. This am patient with no chest pain, no nausea or vomiting. Tolerating po well.   Objective: Vitals:   07/29/18 0826 07/29/18 1145 07/29/18 2001 07/30/18 0536  BP: (!) 156/67 120/60 124/60 (!) 109/56  Pulse: 71 (!) 55 63   Resp:  19 18 20   Temp:  98.1 F (36.7 C) 98.3 F (36.8 C) 98.8 F (37.1  C)  TempSrc:  Oral Oral Oral  SpO2:  96% 98%   Weight:    84.9 kg  Height:        Intake/Output Summary (Last 24 hours) at  07/30/2018 0826 Last data filed at 07/30/2018 0600 Gross per 24 hour  Intake 1334.63 ml  Output 450 ml  Net 884.63 ml   Filed Weights   07/28/18 0500 07/29/18 0500 07/30/18 0536  Weight: 85.5 kg 84.9 kg 84.9 kg    Examination:   General: Not in pain or dyspnea  Neurology: Awake and alert, non focal  E ENT: no pallor, no icterus, oral mucosa moist Cardiovascular: No JVD. S1-S2 present, rhythmic, no gallops, rubs, or murmurs. No lower extremity edema. Pulmonary: positive breath sounds bilaterally, adequate air movement, no wheezing, rhonchi or rales. Gastrointestinal. Abdomen with no organomegaly, non tender, no rebound or guarding Skin. No rashes Musculoskeletal: no joint deformities     Data Reviewed: I have personally reviewed following labs and imaging studies  CBC: Recent Labs  Lab 07/26/18 0055 07/27/18 0531 07/28/18 0408 07/29/18 0226 07/30/18 0303  WBC 7.2 7.0 7.3 8.0 10.9*  NEUTROABS 4.7  --   --   --   --   HGB 10.0* 10.5* 10.3* 10.2* 10.0*  HCT 31.2* 32.8* 31.3* 30.4* 30.2*  MCV 94.0 92.1 91.5 89.1 89.9  PLT 187 206 186 173 161   Basic Metabolic Panel: Recent Labs  Lab 07/26/18 0804 07/27/18 0939 07/28/18 0408 07/29/18 0226 07/30/18 0303  NA 133* 129* 128* 124* 124*  K 4.1 4.2 4.7 4.4 4.7  CL 99 94* 95* 92* 92*  CO2 25 25 24 23 22   GLUCOSE 97 96 91 99 105*  BUN 15 15 13 13 12   CREATININE 1.43* 1.43* 1.29* 1.16 1.27*  CALCIUM 9.2 9.0 9.1 8.9 8.9   GFR: Estimated Creatinine Clearance: 51.9 mL/min (A) (by C-G formula based on SCr of 1.27 mg/dL (H)). Liver Function Tests: Recent Labs  Lab 07/30/18 0303  AST 39  ALT 50*  ALKPHOS 63  BILITOT 0.7  PROT 6.4*  ALBUMIN 3.8   No results for input(s): LIPASE, AMYLASE in the last 168 hours. No results for input(s): AMMONIA in the last 168 hours. Coagulation Profile: Recent Labs  Lab 07/26/18 0055  INR 1.31   Cardiac Enzymes: Recent Labs  Lab 07/26/18 0228 07/26/18 0804 07/26/18 1401  07/30/18 0303  TROPONINI 0.04* 0.03* <0.03 <0.03   BNP (last 3 results) No results for input(s): PROBNP in the last 8760 hours. HbA1C: Recent Labs    07/28/18 0408  HGBA1C 6.4*   CBG: Recent Labs  Lab 07/27/18 0754 07/27/18 1211 07/27/18 1941 07/27/18 2344 07/28/18 0531  GLUCAP 87 86 121* 98 94   Lipid Profile: No results for input(s): CHOL, HDL, LDLCALC, TRIG, CHOLHDL, LDLDIRECT in the last 72 hours. Thyroid Function Tests: No results for input(s): TSH, T4TOTAL, FREET4, T3FREE, THYROIDAB in the last 72 hours. Anemia Panel: No results for input(s): VITAMINB12, FOLATE, FERRITIN, TIBC, IRON, RETICCTPCT in the last 72 hours.    Radiology Studies: I have reviewed all of the imaging during this hospital visit personally     Scheduled Meds: . amiodarone  200 mg Oral Daily  . atorvastatin  20 mg Oral q1800  . chlorpheniramine-HYDROcodone  5 mL Oral Q12H  . cholecalciferol  1,000 Units Oral QPM  . fluticasone  2 spray Each Nare BID  . isosorbide mononitrate  60 mg Oral Daily  . loratadine  10 mg Oral Daily  .  metoprolol tartrate  25 mg Oral BID  . multivitamin with minerals  1 tablet Oral QHS  . sodium chloride flush  3 mL Intravenous Q12H  . sodium chloride  2 g Oral TID WC   Continuous Infusions: . sodium chloride    . heparin 1,050 Units/hr (07/29/18 2109)     LOS: 3 days        Bertin Inabinet Gerome Apley, MD [facility] Pager [phone]

## 2018-07-30 NOTE — Progress Notes (Addendum)
Pt c/o his chest pain coming back.  Administered nitro SL x1 with good relief. Received order for nitro drip.  Idolina Primer, RN

## 2018-07-30 NOTE — Progress Notes (Signed)
Progress Note  Patient Name: Gregory Bates Date of Encounter: 07/30/2018  Primary Cardiologist: Lauree Chandler, MD   Subjective   Denies angina pectoris.  No dyspnea.  Just back from brain MRI.  Inpatient Medications    Scheduled Meds: . amiodarone  200 mg Oral Daily  . atorvastatin  20 mg Oral q1800  . chlorpheniramine-HYDROcodone  5 mL Oral Q12H  . cholecalciferol  1,000 Units Oral QPM  . fluticasone  2 spray Each Nare BID  . isosorbide mononitrate  60 mg Oral Daily  . loratadine  10 mg Oral Daily  . metoprolol tartrate  25 mg Oral BID  . multivitamin with minerals  1 tablet Oral QHS  . sodium chloride flush  3 mL Intravenous Q12H  . sodium chloride  2 g Oral TID WC   Continuous Infusions: . sodium chloride    . heparin 1,050 Units/hr (07/30/18 0830)   PRN Meds: sodium chloride, acetaminophen, alum & mag hydroxide-simeth, morphine injection, nitroGLYCERIN, ondansetron (ZOFRAN) IV, sodium chloride flush   Vital Signs    Vitals:   07/29/18 2001 07/30/18 0536 07/30/18 0829 07/30/18 0911  BP: 124/60 (!) 109/56 107/77 (!) 115/58  Pulse: 63  74 78  Resp: 18 20 20 17   Temp: 98.3 F (36.8 C) 98.8 F (37.1 C)    TempSrc: Oral Oral    SpO2: 98%  96% 97%  Weight:  84.9 kg    Height:        Intake/Output Summary (Last 24 hours) at 07/30/2018 1056 Last data filed at 07/30/2018 0900 Gross per 24 hour  Intake 1574.63 ml  Output 450 ml  Net 1124.63 ml   Filed Weights   07/28/18 0500 07/29/18 0500 07/30/18 0536  Weight: 85.5 kg 84.9 kg 84.9 kg    Telemetry    Normal sinus rhythm- Personally Reviewed  ECG    No new tracing today.- Personally Reviewed  Physical Exam  Appears healthy with mild dyspnea GEN:  Mild pallor Neck: No JVD Cardiac: RRR, no murmurs, rubs, or gallops.  Respiratory: Clear to auscultation bilaterally. GI: Soft, nontender, non-distended  MS: No edema; No deformity. Neuro:  Nonfocal  Psych: Normal affect   Labs     Chemistry Recent Labs  Lab 07/28/18 0408 07/29/18 0226 07/30/18 0303  NA 128* 124* 124*  K 4.7 4.4 4.7  CL 95* 92* 92*  CO2 24 23 22   GLUCOSE 91 99 105*  BUN 13 13 12   CREATININE 1.29* 1.16 1.27*  CALCIUM 9.1 8.9 8.9  PROT  --   --  6.4*  ALBUMIN  --   --  3.8  AST  --   --  39  ALT  --   --  50*  ALKPHOS  --   --  63  BILITOT  --   --  0.7  GFRNONAA 52* 59* 53*  GFRAA >60 >60 >60  ANIONGAP 9 9 10      Hematology Recent Labs  Lab 07/28/18 0408 07/29/18 0226 07/30/18 0303  WBC 7.3 8.0 10.9*  RBC 3.42* 3.41* 3.36*  HGB 10.3* 10.2* 10.0*  HCT 31.3* 30.4* 30.2*  MCV 91.5 89.1 89.9  MCH 30.1 29.9 29.8  MCHC 32.9 33.6 33.1  RDW 12.8 12.6 12.8  PLT 186 173 208    Cardiac Enzymes Recent Labs  Lab 07/26/18 0804 07/26/18 1401 07/30/18 0303 07/30/18 0918  TROPONINI 0.03* <0.03 <0.03 <0.03    Recent Labs  Lab 07/24/18 1127 07/26/18 0057  TROPIPOC 0.00 0.03  BNP Recent Labs  Lab 07/24/18 1118  BNP 121.9*     DDimer No results for input(s): DDIMER in the last 168 hours.   Radiology    Dg Chest Port 1 View  Result Date: 07/30/2018 CLINICAL DATA:  Chest pain EXAM: PORTABLE CHEST 1 VIEW COMPARISON:  07/26/2018 FINDINGS: Mild cardiomegaly. Normal vascularity. Right paratracheal mass is stable. No pneumothorax or pleural effusion. IMPRESSION: Stable right paratracheal mass. Stable cardiomegaly. Electronically Signed   By: Marybelle Killings M.D.   On: 07/30/2018 09:28    Cardiac Studies   Coronary angiogram reviewed.  Agree that stenting without a significant ischemic event would be difficult due to angulation and calcification.  Patient Profile     77 y.o. male severe advancement of coronary artery disease, paroxysmal H fibrillation with Eliquis on hold, diabetes with hypertension hyperlipidemia, chronic kidney disease stage III with abnormal chest x-ray, recommendations for chest CT which was ordered by Dr. Servando Snare and concerning for lung  cancer.  Assessment & Plan    1. Native coronary artery disease: With unstable angina due to left main disease.  Anatomy is surgical if can be done with reasonable risk.  Having angina occasionally at rest.  Episodes have been short-lived and responsive to nitroglycerin.  If they continue, will start IV nitroglycerin.  Check EKG today. 2. Probable small cell lung cancer: Currently being evaluated for evidence of metastatic disease and if none will likely have biopsy for tissue diagnosis at time of bypass surgery. 3. Hyponatremia: Consider tolvaptan therapy  4. PAROXYSMAL A. fib: Currently in normal sinus rhythm  Will use sublingual nitroglycerin if recurrent angina.  If continues to have episodes we will start IV nitroglycerin.  Awaiting staging of probable small cell carcinoma of the lung.      For questions or updates, please contact [place] Please consult [email or web address] for contact info under        Signed, Sinclair Grooms, MD  07/30/2018, 10:56 AM

## 2018-07-30 NOTE — Progress Notes (Addendum)
Patient was unable to tolerate getting an MRI. Patient became short of breath when instructed to lay flat. Shortly after returning back to [place] patient started having chest pain that he rated 8 out of 10. Two liters of oxygen were applied, a total of 4 mg of Morphine given, and 3 sublingual nitroglycerin tablets were administered. Patients states that the pain still "wont ease off". Doctor on call notified. Awaiting new orders. Will continue to monitor.   Horton Finer

## 2018-07-30 NOTE — Progress Notes (Signed)
[facility] for heparin Indication: atrial fibrillation  Allergies  Allergen Reactions  . Brilinta [Ticagrelor] Other (See Comments)    Arthralgias & myalgias  . Crestor [Rosuvastatin] Other (See Comments)    Myalgias    Patient Measurements: Height: 5\' 11"  (180.3 cm) Weight: 187 lb 1.6 oz (84.9 kg) IBW/kg (Calculated) : 75.3  Vital Signs: Temp: 99.6 F (37.6 C) (09/28 1124) Temp Source: Oral (09/28 1124) BP: 98/45 (09/28 1124) Pulse Rate: 62 (09/28 1124)  Labs: Recent Labs    07/28/18 0408 07/28/18 0944 07/29/18 0226 07/29/18 0925 07/30/18 0303 07/30/18 0918 07/30/18 1436  HGB 10.3*  --  10.2*  --  10.0*  --   --   HCT 31.3*  --  30.4*  --  30.2*  --   --   PLT 186  --  173  --  208  --   --   APTT 51* 53* 86*  --   --   --   --   HEPARINUNFRC 0.79*  --  0.51 0.54  --   --  0.31  CREATININE 1.29*  --  1.16  --  1.27*  --   --   TROPONINI  --   --   --   --  <0.03 <0.03  --     Estimated Creatinine Clearance: 51.9 mL/min (A) (by C-G formula based on SCr of 1.27 mg/dL (H)).   Medical History: Past Medical History:  Diagnosis Date  . Acute renal insufficiency    a. During 11/2013 adm with atrial flutter.  . Anemia, unspecified   . CAD (coronary artery disease), native coronary artery    a. s/p cath May 2011 >> DES distal RCA;  b. 01/2013 NSTEMI >> OM1 99p (2.75x12 Promus Premier DES);  c. LHC (2/15): PCI: Promus DES to dist RCA ;  d. LHC 1/16 - dLM 74, oLAD 30, small D1 tandem 106, pOM1 stent ok, mOM 50-60, dOM bifurcation 50-60 in both vessels, AV 73-70 jailed by stent, pRCA 32 mRCA stent ok, dRCA stent ok, PLB smal 50, dPDA 60, 80; EF 35-40 >> med Rx - PCI of PDA if angina  . Chronic combined systolic and diastolic CHF (congestive heart failure) ([place]) 12/23/2015   Echo 2/17: Mild LVH, EF 45-50%, inferior akinesis, grade 2 diastolic dysfunction, mild AI, mild MR, mild LAE, PASP 44 mmHg   . Colorectal polyps 2002  . Diabetes  mellitus    AODM  . Erectile dysfunction   . History of echocardiogram    a. 01/2013 Echo: EF 55%, mid-dist inflat HK, Gr1 DD, Triv AI/TR, Mild MR.;  b.  Echo (1/15): Mild LVH, EF 55%, inferolateral hypokinesis, grade 1 diastolic dysfunction, mild AI, trivial MR, moderate LAE, PASP 36 mmHg   . Hx of cardiovascular stress test    Stress myoview 08/07/13 with LVEF 44%, inferior scar, no ischemia  . Hyperlipidemia   . Hypertension   . Ischemic cardiomyopathy    a. EF 35-40% by LHC in 1/16; b. Echo 2/17: Mild LVH, EF 45-50%, inferior akinesis, grade 2 diastolic dysfunction, mild AI, mild MR, mild LAE, PASP 44 mmHg  . Myocardial infarction ([place])   . PAF (paroxysmal atrial fibrillation) ([place])    a. Dx 11/2013. Spont conv. Placed on eliquis.     Assessment: 77yo male c/o CP w/ mild SOB and productive cough, took NTG x2 at home w/ relief, admitted for further w/u, to transition from Eliquis (for Afib) to UFH; last dose of Eliquis  taken PTA 9/23 at 1800. He Underwent cardiac cath 9/25- showing severe 3 vessel CAD. Heparin to continue while awaiting CABG on 9/30 -heparin level at goal   Goal of Therapy:  Heparin level 0.3-0.7 units/ml aPTT 66-102 seconds Monitor platelets by anticoagulation protocol: Yes   Plan:  -No heparin changes needed -Daily heparin level and CBC  Hildred Laser, PharmD Clinical Pharmacist Please check Amion for pharmacy contact number

## 2018-07-30 NOTE — Progress Notes (Signed)
      [address]       [place] [address]             [phone]    3 Days Post-Op Procedure(s) (LRB): LEFT HEART CATH AND CORONARY ANGIOGRAPHY (N/A) Subjective: No chest pain this morning. No shortness of breath.   Objective: Vital signs in last 24 hours: Temp:  [98.1 F (36.7 C)-98.8 F (37.1 C)] 98.8 F (37.1 C) (09/28 0536) Pulse Rate:  [55-74] 74 (09/28 0829) Cardiac Rhythm: Normal sinus rhythm (09/28 0700) Resp:  [18-20] 20 (09/28 0829) BP: (107-124)/(56-77) 107/77 (09/28 0829) SpO2:  [96 %-98 %] 96 % (09/28 0829) Weight:  [84.9 kg] 84.9 kg (09/28 0536)     Intake/Output from previous day: 09/27 0701 - 09/28 0700 In: 1334.6 [P.O.:960; I.V.:374.6] Out: 450 [Urine:450] Intake/Output this shift: No intake/output data recorded.   Lab Results: Recent Labs    07/29/18 0226 07/30/18 0303  WBC 8.0 10.9*  HGB 10.2* 10.0*  HCT 30.4* 30.2*  PLT 173 208   BMET:  Recent Labs    07/29/18 0226 07/30/18 0303  NA 124* 124*  K 4.4 4.7  CL 92* 92*  CO2 23 22  GLUCOSE 99 105*  BUN 13 12  CREATININE 1.16 1.27*  CALCIUM 8.9 8.9    PT/INR: No results for input(s): LABPROT, INR in the last 72 hours. ABG    Component Value Date/Time   HCO3 28.9 (H) 07/17/2007 0002   TCO2 27 11/03/2013 2000   CBG (last 3)  Recent Labs    07/27/18 1941 07/27/18 2344 07/28/18 0531  GLUCAP 121* 98 94    Assessment/Plan: S/P Procedure(s) (LRB): LEFT HEART CATH AND CORONARY ANGIOGRAPHY (N/A)  1. Patient's chest pain has resolved since last night. He is currently on room air without complaint. He was unable to undergo MRI last night due to shortness of breath which sounds like it was mostly due to congestion. He did have chest pain at that time which resolved with 3 sublingual nitro labs. On Heparin gtt.   Plan: Will try to obtain a non-contrast CT of the head today. Patient is agreeable and is asking to be on oxygen at the time of the test. Discussed plan  with Dr. Servando Snare     LOS: 3 days    Elgie Collard 07/30/2018

## 2018-07-31 ENCOUNTER — Encounter (HOSPITAL_COMMUNITY): Payer: Self-pay | Admitting: Certified Registered Nurse Anesthetist

## 2018-07-31 LAB — URINALYSIS, ROUTINE W REFLEX MICROSCOPIC
Bilirubin Urine: NEGATIVE
Glucose, UA: NEGATIVE mg/dL
Hgb urine dipstick: NEGATIVE
Ketones, ur: NEGATIVE mg/dL
Leukocytes, UA: NEGATIVE
Nitrite: NEGATIVE
Protein, ur: NEGATIVE mg/dL
Specific Gravity, Urine: 1.017 (ref 1.005–1.030)
pH: 5 (ref 5.0–8.0)

## 2018-07-31 LAB — BASIC METABOLIC PANEL
ANION GAP: 8 (ref 5–15)
BUN: 14 mg/dL (ref 8–23)
CALCIUM: 9 mg/dL (ref 8.9–10.3)
CHLORIDE: 94 mmol/L — AB (ref 98–111)
CO2: 24 mmol/L (ref 22–32)
Creatinine, Ser: 1.34 mg/dL — ABNORMAL HIGH (ref 0.61–1.24)
GFR calc non Af Amer: 49 mL/min — ABNORMAL LOW (ref >60)
GFR, EST AFRICAN AMERICAN: 57 mL/min — AB (ref >60)
Glucose, Bld: 99 mg/dL (ref 70–99)
Potassium: 4.4 mmol/L (ref 3.5–5.1)
SODIUM: 126 mmol/L — AB (ref 135–145)

## 2018-07-31 LAB — SURGICAL PCR SCREEN
MRSA, PCR: NEGATIVE
Staphylococcus aureus: POSITIVE — AB

## 2018-07-31 LAB — ABO/RH: ABO/RH(D): B POS

## 2018-07-31 LAB — PREPARE RBC (CROSSMATCH)

## 2018-07-31 LAB — POCT I-STAT 3, ART BLOOD GAS (G3+)
ACID-BASE DEFICIT: 2 mmol/L (ref 0.0–2.0)
Bicarbonate: 22.7 mmol/L (ref 20.0–28.0)
O2 Saturation: 98 %
PCO2 ART: 37.9 mmHg (ref 32.0–48.0)
PH ART: 7.385 (ref 7.350–7.450)
PO2 ART: 100 mmHg (ref 83.0–108.0)
TCO2: 24 mmol/L (ref 22–32)

## 2018-07-31 LAB — OSMOLALITY: OSMOLALITY: 261 mosm/kg — AB (ref 275–295)

## 2018-07-31 LAB — CBC
HEMATOCRIT: 29.3 % — AB (ref 39.0–52.0)
Hemoglobin: 9.7 g/dL — ABNORMAL LOW (ref 13.0–17.0)
MCH: 30 pg (ref 26.0–34.0)
MCHC: 33.1 g/dL (ref 30.0–36.0)
MCV: 90.7 fL (ref 78.0–100.0)
Platelets: 230 10*3/uL (ref 150–400)
RBC: 3.23 MIL/uL — AB (ref 4.22–5.81)
RDW: 13.2 % (ref 11.5–15.5)
WBC: 9.2 10*3/uL (ref 4.0–10.5)

## 2018-07-31 LAB — PLATELET INHIBITION P2Y12: Platelet Function  P2Y12: 307 [PRU] (ref 194–418)

## 2018-07-31 LAB — HEMOGLOBIN A1C
Hgb A1c MFr Bld: 6.4 % — ABNORMAL HIGH (ref 4.8–5.6)
Mean Plasma Glucose: 136.98 mg/dL

## 2018-07-31 LAB — HEPARIN LEVEL (UNFRACTIONATED): Heparin Unfractionated: 0.33 IU/mL (ref 0.30–0.70)

## 2018-07-31 MED ORDER — AMIODARONE IV BOLUS ONLY 150 MG/100ML
150.0000 mg | Freq: Once | INTRAVENOUS | Status: AC
  Administered 2018-07-31: 150 mg via INTRAVENOUS

## 2018-07-31 MED ORDER — SODIUM CHLORIDE 0.9 % IV SOLN
INTRAVENOUS | Status: DC
  Filled 2018-07-31: qty 30

## 2018-07-31 MED ORDER — DOPAMINE-DEXTROSE 3.2-5 MG/ML-% IV SOLN
0.0000 ug/kg/min | INTRAVENOUS | Status: AC
  Administered 2018-08-01: 3 ug/kg/min via INTRAVENOUS
  Filled 2018-07-31: qty 250

## 2018-07-31 MED ORDER — DEXMEDETOMIDINE HCL IN NACL 400 MCG/100ML IV SOLN
0.1000 ug/kg/h | INTRAVENOUS | Status: AC
  Administered 2018-08-01: .5 ug/kg/h via INTRAVENOUS
  Filled 2018-07-31: qty 100

## 2018-07-31 MED ORDER — CHLORHEXIDINE GLUCONATE 0.12 % MT SOLN
15.0000 mL | Freq: Once | OROMUCOSAL | Status: AC
  Administered 2018-08-01: 15 mL via OROMUCOSAL
  Filled 2018-07-31: qty 15

## 2018-07-31 MED ORDER — VANCOMYCIN HCL 10 G IV SOLR
1500.0000 mg | INTRAVENOUS | Status: AC
  Administered 2018-08-01: 1500 mg via INTRAVENOUS
  Filled 2018-07-31: qty 1500

## 2018-07-31 MED ORDER — PLASMA-LYTE 148 IV SOLN
INTRAVENOUS | Status: DC
  Filled 2018-07-31: qty 2.5

## 2018-07-31 MED ORDER — SODIUM CHLORIDE 0.9 % IV SOLN
1.5000 g | INTRAVENOUS | Status: AC
  Administered 2018-08-01: 1.5 g via INTRAVENOUS
  Filled 2018-07-31: qty 1.5

## 2018-07-31 MED ORDER — CHLORHEXIDINE GLUCONATE CLOTH 2 % EX PADS
6.0000 | MEDICATED_PAD | Freq: Every day | CUTANEOUS | Status: DC
  Administered 2018-07-31: 6 via TOPICAL

## 2018-07-31 MED ORDER — SODIUM CHLORIDE 0.9 % IV SOLN
INTRAVENOUS | Status: AC
  Administered 2018-08-01: 1.1 [IU]/h via INTRAVENOUS
  Filled 2018-07-31: qty 1

## 2018-07-31 MED ORDER — CHLORHEXIDINE GLUCONATE CLOTH 2 % EX PADS
6.0000 | MEDICATED_PAD | Freq: Once | CUTANEOUS | Status: AC
  Administered 2018-08-01: 6 via TOPICAL

## 2018-07-31 MED ORDER — TEMAZEPAM 15 MG PO CAPS
15.0000 mg | ORAL_CAPSULE | Freq: Once | ORAL | Status: AC | PRN
  Administered 2018-07-31: 15 mg via ORAL
  Filled 2018-07-31: qty 1

## 2018-07-31 MED ORDER — SODIUM CHLORIDE 0.9 % IV SOLN
750.0000 mg | INTRAVENOUS | Status: AC
  Administered 2018-08-01: 750 mg via INTRAVENOUS
  Filled 2018-07-31: qty 750

## 2018-07-31 MED ORDER — METOPROLOL TARTRATE 5 MG/5ML IV SOLN
5.0000 mg | INTRAVENOUS | Status: DC | PRN
  Administered 2018-07-31: 5 mg via INTRAVENOUS
  Filled 2018-07-31: qty 5

## 2018-07-31 MED ORDER — TRANEXAMIC ACID (OHS) PUMP PRIME SOLUTION
2.0000 mg/kg | INTRAVENOUS | Status: DC
  Filled 2018-07-31: qty 1.71

## 2018-07-31 MED ORDER — MAGNESIUM SULFATE 50 % IJ SOLN
40.0000 meq | INTRAMUSCULAR | Status: DC
  Filled 2018-07-31 (×3): qty 9.85

## 2018-07-31 MED ORDER — MUPIROCIN 2 % EX OINT
1.0000 "application " | TOPICAL_OINTMENT | Freq: Two times a day (BID) | CUTANEOUS | Status: AC
  Administered 2018-07-31 – 2018-08-04 (×9): 1 via NASAL
  Filled 2018-07-31 (×4): qty 22

## 2018-07-31 MED ORDER — TRANEXAMIC ACID 1000 MG/10ML IV SOLN
1.5000 mg/kg/h | INTRAVENOUS | Status: AC
  Administered 2018-08-01: 1.5 mg/kg/h via INTRAVENOUS
  Filled 2018-07-31: qty 25

## 2018-07-31 MED ORDER — MILRINONE LACTATE IN DEXTROSE 20-5 MG/100ML-% IV SOLN
0.3000 ug/kg/min | INTRAVENOUS | Status: DC
  Filled 2018-07-31: qty 100

## 2018-07-31 MED ORDER — PHENYLEPHRINE HCL-NACL 20-0.9 MG/250ML-% IV SOLN
30.0000 ug/min | INTRAVENOUS | Status: AC
  Administered 2018-08-01: 20 ug/min via INTRAVENOUS
  Filled 2018-07-31: qty 250

## 2018-07-31 MED ORDER — TRANEXAMIC ACID (OHS) BOLUS VIA INFUSION
15.0000 mg/kg | INTRAVENOUS | Status: AC
  Administered 2018-08-01: 1279.5 mg via INTRAVENOUS
  Filled 2018-07-31: qty 1280

## 2018-07-31 MED ORDER — EPINEPHRINE PF 1 MG/ML IJ SOLN
0.0000 ug/min | INTRAVENOUS | Status: DC
  Filled 2018-07-31: qty 4

## 2018-07-31 MED ORDER — NITROGLYCERIN IN D5W 200-5 MCG/ML-% IV SOLN
2.0000 ug/min | INTRAVENOUS | Status: DC
  Filled 2018-07-31: qty 250

## 2018-07-31 MED ORDER — BISACODYL 5 MG PO TBEC
5.0000 mg | DELAYED_RELEASE_TABLET | Freq: Once | ORAL | Status: AC
  Administered 2018-07-31: 5 mg via ORAL
  Filled 2018-07-31: qty 1

## 2018-07-31 MED ORDER — POTASSIUM CHLORIDE 2 MEQ/ML IV SOLN
80.0000 meq | INTRAVENOUS | Status: DC
  Filled 2018-07-31: qty 40

## 2018-07-31 MED ORDER — CHLORHEXIDINE GLUCONATE CLOTH 2 % EX PADS
6.0000 | MEDICATED_PAD | Freq: Once | CUTANEOUS | Status: AC
  Administered 2018-07-31: 6 via TOPICAL

## 2018-07-31 MED ORDER — AMIODARONE HCL IN DEXTROSE 360-4.14 MG/200ML-% IV SOLN
INTRAVENOUS | Status: AC
  Filled 2018-07-31: qty 200

## 2018-07-31 NOTE — Progress Notes (Signed)
Progress Note  Patient Name: Gregory Bates Date of Encounter: 07/31/2018  Primary Cardiologist: Lauree Chandler, MD   Subjective   Had frequent episodes of discomfort yesterday ultimately responsive to nitroglycerin but eventually leading to IV nitro.  Currently stable on nitroglycerin.  Sitting at bedside, watching television, and in no distress.  Inpatient Medications    Scheduled Meds: . amiodarone  200 mg Oral Daily  . atorvastatin  20 mg Oral q1800  . chlorpheniramine-HYDROcodone  5 mL Oral Q12H  . cholecalciferol  1,000 Units Oral QPM  . fluticasone  2 spray Each Nare BID  . [START ON 08/01/2018] heparin-papaverine-plasmalyte irrigation   Irrigation To OR  . isosorbide mononitrate  60 mg Oral Daily  . loratadine  10 mg Oral Daily  . [START ON 08/01/2018] magnesium sulfate  40 mEq Other To OR  . metoprolol tartrate  25 mg Oral BID  . multivitamin with minerals  1 tablet Oral QHS  . [START ON 08/01/2018] phenylephrine  30-200 mcg/min Intravenous To OR  . [START ON 08/01/2018] potassium chloride  80 mEq Other To OR  . sodium chloride flush  3 mL Intravenous Q12H  . sodium chloride  2 g Oral TID WC  . [START ON 08/01/2018] tranexamic acid  15 mg/kg Intravenous To OR  . [START ON 08/01/2018] tranexamic acid  2 mg/kg Intracatheter To OR   Continuous Infusions: . sodium chloride    . [START ON 08/01/2018] cefUROXime (ZINACEF)  IV    . [START ON 08/01/2018] cefUROXime (ZINACEF)  IV    . [START ON 08/01/2018] dexmedetomidine    . [START ON 08/01/2018] DOPamine    . [START ON 08/01/2018] epinephrine    . [START ON 08/01/2018] heparin 30,000 units/NS 1000 mL solution for CELLSAVER    . heparin 1,050 Units/hr (07/31/18 0731)  . [START ON 08/01/2018] insulin (NOVOLIN-R) infusion    . [START ON 08/01/2018] milrinone    . nitroGLYCERIN 5 mcg/min (07/30/18 1855)  . [START ON 08/01/2018] nitroGLYCERIN    . [START ON 08/01/2018] tranexamic acid (CYKLOKAPRON) infusion ([facility])    . [START ON  08/01/2018] vancomycin     PRN Meds: sodium chloride, acetaminophen, alum & mag hydroxide-simeth, morphine injection, nitroGLYCERIN, ondansetron (ZOFRAN) IV, sodium chloride flush   Vital Signs    Vitals:   07/30/18 1841 07/30/18 2020 07/30/18 2146 07/31/18 0318  BP: (!) 116/58 (!) 120/48 (!) 107/51 126/67  Pulse: 78 71 67 69  Resp:  19  19  Temp:  98.9 F (37.2 C)  98 F (36.7 C)  TempSrc:  Oral  Oral  SpO2: 94% 98%  96%  Weight:    85.3 kg  Height:        Intake/Output Summary (Last 24 hours) at 07/31/2018 0903 Last data filed at 07/30/2018 1700 Gross per 24 hour  Intake 480 ml  Output -  Net 480 ml   Filed Weights   07/29/18 0500 07/30/18 0536 07/31/18 0318  Weight: 84.9 kg 84.9 kg 85.3 kg    Telemetry    Normal sinus rhythm- Personally Reviewed  ECG    Performed yesterday during an episode of pain revealed normal sinus rhythm, poor R wave progression, and 1/2 to 1 mm ST depression V4 through V6.- Personally Reviewed  Physical Exam  Healthy-appearing head exam remains unchanged compared to yesterday as noted below. GEN:  Mild pallor Neck: No JVD Cardiac: RRR, no murmurs, rubs, or gallops.  Respiratory: Clear to auscultation bilaterally. GI: Soft, nontender, non-distended  MS: No  edema; No deformity. Neuro:  Nonfocal  Psych: Normal affect   Labs    Chemistry Recent Labs  Lab 07/29/18 0226 07/30/18 0303 07/31/18 0332  NA 124* 124* 126*  K 4.4 4.7 4.4  CL 92* 92* 94*  CO2 23 22 24   GLUCOSE 99 105* 99  BUN 13 12 14   CREATININE 1.16 1.27* 1.34*  CALCIUM 8.9 8.9 9.0  PROT  --  6.4*  --   ALBUMIN  --  3.8  --   AST  --  39  --   ALT  --  50*  --   ALKPHOS  --  63  --   BILITOT  --  0.7  --   GFRNONAA 59* 53* 49*  GFRAA >60 >60 57*  ANIONGAP 9 10 8      Hematology Recent Labs  Lab 07/29/18 0226 07/30/18 0303 07/31/18 0332  WBC 8.0 10.9* 9.2  RBC 3.41* 3.36* 3.23*  HGB 10.2* 10.0* 9.7*  HCT 30.4* 30.2* 29.3*  MCV 89.1 89.9 90.7  MCH 29.9  29.8 30.0  MCHC 33.6 33.1 33.1  RDW 12.6 12.8 13.2  PLT 173 208 230    Cardiac Enzymes Recent Labs  Lab 07/26/18 1401 07/30/18 0303 07/30/18 0918 07/30/18 1436  TROPONINI <0.03 <0.03 <0.03 <0.03    Recent Labs  Lab 07/24/18 1127 07/26/18 0057  TROPIPOC 0.00 0.03     BNP Recent Labs  Lab 07/24/18 1118  BNP 121.9*     DDimer No results for input(s): DDIMER in the last 168 hours.   Radiology    Ct Head Wo Contrast  Result Date: 07/30/2018 CLINICAL DATA:  Post heart catheterization now with subacute neurologic defects. EXAM: CT HEAD WITHOUT CONTRAST TECHNIQUE: Contiguous axial images were obtained from the base of the skull through the vertex without intravenous contrast. COMPARISON:  11/02/2017 FINDINGS: Brain: Similar findings of atrophy with centralized volume loss and mild commensurate ex vacuo dilatation the ventricular system. Re demonstrated scattered periventricular hypodensities, most conspicuous about the left centrum semiovale compatible with microvascular ischemic disease. Gray-white differentiation is otherwise well maintained without CT evidence of superimposed acute large territory infarct. Known approximately 2.1 x 1 cm meningioma involving the left sphenoid wing is unchanged compared to the 11/2017 examination (coronal image 27, series 5). No intraparenchymal or extra-axial hemorrhage. Unchanged size and configuration of the ventricles and the basilar cisterns. No midline shift. Vascular: Intracranial atherosclerosis. Skull: Old fractures of the left maxillary sinus, orbit and zygomatic arch, improved compared to the 11/2017 examination. No acute displaced calvarial fracture. Sinuses/Orbits: Scattered opacification of the right anterior ethmoidal air cells. Mucosal thickening of the bilateral maxillary sinuses with small air-fluid level within the left maxillary sinus. Scattered opacification of the left mastoid air cells. Other: Regional soft tissues appear normal.  IMPRESSION: 1. Similar findings of atrophy and microvascular ischemic disease without superimposed acute intracranial process. 2. Old fractures of the left maxillary sinus, orbit and zygomatic arch, improved compared to the 11/2017 examination. 3. Sinus disease as above, improved compared to the 11/2017 examination, though there is an air-fluid level within the left maxillary sinus as could be seen in the setting of acute sinusitis. 4. Grossly unchanged appearance of known approximately 2.1 cm left sphenoid wing calcified meningioma. Electronically Signed   By: Sandi Mariscal M.D.   On: 07/30/2018 13:34   Dg Chest Port 1 View  Result Date: 07/30/2018 CLINICAL DATA:  Chest pain EXAM: PORTABLE CHEST 1 VIEW COMPARISON:  07/26/2018 FINDINGS: Mild cardiomegaly. Normal vascularity. Right paratracheal  mass is stable. No pneumothorax or pleural effusion. IMPRESSION: Stable right paratracheal mass. Stable cardiomegaly. Electronically Signed   By: Marybelle Killings M.D.   On: 07/30/2018 09:28    Cardiac Studies   Coronary angiogram reviewed.  Agree that stenting without a significant ischemic complication would be difficult due to angulation and calcification.  Patient Profile     77 y.o. male severe advancement of coronary artery disease, paroxysmal H fibrillation with Eliquis on hold, diabetes with hypertension hyperlipidemia, chronic kidney disease stage III with abnormal chest x-ray, recommendations for chest CT which was ordered by Dr. Servando Snare and concerning for lung cancer.  Assessment & Plan    1. Native coronary artery disease: IV nitro started last evening because of recurring episodes of chest pain.  If chest pain recurs on IV nitro/becomes refractory, will need urgent CABG. 2. Probable small cell lung cancer: Brain scan by CT did not demonstrate evidence of metastatic disease. 3. Hyponatremia: Slowly improving.  Consider tolvaptan if worsens.  Likely SIADH related to lung cancer. 4. PAROXYSMAL A. fib:  Remains in sinus rhythm.  Continue IV nitroglycerin.  If recurrence of angina on IV therapy, will need to contact T CTS to be in the loop with reference to clinical course.  ECG this morning.      For questions or updates, please contact [place] Please consult [email or web address] for contact info under        Signed, Sinclair Grooms, MD  07/31/2018, 9:03 AM

## 2018-07-31 NOTE — Progress Notes (Addendum)
      [address]       [place] [address]             [phone]       CT scan of the brain reviewed-no evidence of metastatic disease.  Patient with a.fib with RVR this morning, IV bolus and drip started by Cardiology as well as IV lopressor. Now in rate-controlled atrial fibrillation, rate in the 80s. BP stable. Now on Nitro gtt and heparin gtt- chest pain free. He is being transferred to the ICU for possible cardioversion and closer monitoring. Discussed patient with Dr. Roxan Hockey.   Nicholes Rough, PA-C  No chest pain at present Cp resolved with rate control troponins negative For CABG in AM  Jonta Gastineau C. Roxan Hockey, MD [facility] [phone]

## 2018-07-31 NOTE — Progress Notes (Signed)
[place] for heparin Indication: atrial fibrillation  Allergies  Allergen Reactions  . Brilinta [Ticagrelor] Other (See Comments)    Arthralgias & myalgias  . Crestor [Rosuvastatin] Other (See Comments)    Myalgias    Patient Measurements: Height: 5\' 11"  (180.3 cm) Weight: 188 lb (85.3 kg) IBW/kg (Calculated) : 75.3  Vital Signs: Temp: 98 F (36.7 C) (09/29 0318) Temp Source: Oral (09/29 0318) BP: 126/67 (09/29 0318) Pulse Rate: 69 (09/29 0318)  Labs: Recent Labs    07/28/18 0944  07/29/18 0226 07/29/18 0925 07/30/18 0303 07/30/18 0918 07/30/18 1436 07/31/18 0332  HGB  --    < > 10.2*  --  10.0*  --   --  9.7*  HCT  --   --  30.4*  --  30.2*  --   --  29.3*  PLT  --   --  173  --  208  --   --  230  APTT 53*  --  86*  --   --   --   --   --   HEPARINUNFRC  --    < > 0.51 0.54  --   --  0.31 0.33  CREATININE  --   --  1.16  --  1.27*  --   --  1.34*  TROPONINI  --   --   --   --  <0.03 <0.03 <0.03  --    < > = values in this interval not displayed.    Estimated Creatinine Clearance: 49.2 mL/min (A) (by C-G formula based on SCr of 1.34 mg/dL (H)).   Medical History: Past Medical History:  Diagnosis Date  . Acute renal insufficiency    a. During 11/2013 adm with atrial flutter.  . Anemia, unspecified   . CAD (coronary artery disease), native coronary artery    a. s/p cath May 2011 >> DES distal RCA;  b. 01/2013 NSTEMI >> OM1 99p (2.75x12 Promus Premier DES);  c. LHC (2/15): PCI: Promus DES to dist RCA ;  d. LHC 1/16 - dLM 8, oLAD 30, small D1 tandem 43, pOM1 stent ok, mOM 50-60, dOM bifurcation 50-60 in both vessels, AV 66-70 jailed by stent, pRCA 25 mRCA stent ok, dRCA stent ok, PLB smal 50, dPDA 60, 80; EF 35-40 >> med Rx - PCI of PDA if angina  . Chronic combined systolic and diastolic CHF (congestive heart failure) ([name]) 12/23/2015   Echo 2/17: Mild LVH, EF 45-50%, inferior akinesis, grade 2 diastolic dysfunction, mild AI,  mild MR, mild LAE, PASP 44 mmHg   . Colorectal polyps 2002  . Diabetes mellitus    AODM  . Erectile dysfunction   . History of echocardiogram    a. 01/2013 Echo: EF 55%, mid-dist inflat HK, Gr1 DD, Triv AI/TR, Mild MR.;  b.  Echo (1/15): Mild LVH, EF 55%, inferolateral hypokinesis, grade 1 diastolic dysfunction, mild AI, trivial MR, moderate LAE, PASP 36 mmHg   . Hx of cardiovascular stress test    Stress myoview 08/07/13 with LVEF 44%, inferior scar, no ischemia  . Hyperlipidemia   . Hypertension   . Ischemic cardiomyopathy    a. EF 35-40% by LHC in 1/16; b. Echo 2/17: Mild LVH, EF 45-50%, inferior akinesis, grade 2 diastolic dysfunction, mild AI, mild MR, mild LAE, PASP 44 mmHg  . Myocardial infarction ([place])   . PAF (paroxysmal atrial fibrillation) ([place])    a. Dx 11/2013. Spont conv. Placed on eliquis.    Assessment: 76yo  male c/o CP w/ mild SOB and productive cough, took NTG x2 at home w/ relief, admitted for further w/u, to transition from Eliquis (for Afib) to UFH; last dose of Eliquis taken PTA 9/23 at 1800. He Underwent cardiac cath 9/25- showing severe 3 vessel CAD. Heparin to continue while awaiting CABG on 9/30  Heparin level again at therapeutic level with morning of 0.33. No documented bleeding, and CBC stable.   Goal of Therapy:  Heparin level 0.3-0.7 units/ml Monitor platelets by anticoagulation protocol: Yes   Plan:  -Continue heparin rate at 1050 units/hr -Daily heparin level and CBC while on heparin.  -monitor s/sx bleeding   [name], PharmD PGY1 Pharmacy Resident Phone [phone]  07/31/2018   7:31 AM

## 2018-07-31 NOTE — Progress Notes (Signed)
PROGRESS NOTE    Gregory Bates  YPP:[id number] DOB: 06/04/1941 DOA: 07/25/2018 PCP: Venetia Maxon, Sharon Mt, MD    Brief Narrative:  77 year old male who presented with chest pain. He does have significant past medical history for diffusecoronary artery disease, diastolic heart failure, chronic kidney disease stage III, paroxysmal atrial fibrillation, hypertension and type 2 diabetes mellitus. Patient complained of worsening exertional chest pain for the last 2 to 3 days, improved with nitroglycerin and sitting down.On the day of admission he had recurrent chest pain that woke him up from his sleep, left-sided, precordial, associated with dyspnea, that prompted him to come to the emergency department. On his initial physical examination blood pressure 127/57, heart rate 68, respiratory rate14, temperature 98.4, oxygen saturation 95%, moist mucous membranes, lungs clear to auscultation bilaterally, heart S1-S2 present, no S3 or S4 gallop, abdomen protuberant, nontender, no lower extremity edema. Sodium 133, potassium 4.1, chloride 99, bicarb 25, glucose 97, BUN 15, creatinine 1.43, troponin 0.03, white cell count 7.0, hemoglobin 10.5, hematocrit 32.8, platelets 206,chest x-ray with hyperinflation, no infiltrates, CT chest with a right upper lobe nodule. EKG sinus rhythm, normal axis, normal intervals, no ST elevations or ST depressions, no significant T wave abnormalities.  Patient was admitted to the hospital with working diagnosis of unstable angina,acute coronary syndrome.   Assessment & Plan:   Principal Problem:   Unstable angina (HCC) Active Problems:   Type 2 diabetes mellitus with vascular disease (HCC)   Normocytic anemia   Essential hypertension   CAD (coronary artery disease)   Chronic diastolic CHF (congestive heart failure) (HCC)   PAF (paroxysmal atrial fibrillation) (HCC)   CKD (chronic kidney disease), stage III (HCC)   Abnormal CXR  1.Unstable angina/ acute  coronary syndrome.Patient continue to have worsening angina, now on nitroglycerin drip. Atrial fibrillation with RVR triggered angina this am, now on amiodarone drip with improved rate control and symptoms. Continue heparin IV and oral statin. For CABG per cardiothorcic surgery.  2. Hyponatremia due to [facility] the setting of right upper lobe lung nodule (3,5x3,4x3.2cm/ right middle lobe (32mm)/ possible lung cancer. Slow improvement on serum Na, now up to 126, will continue fluid restriction and salt tablets. Serum osmolality is low.   3. CKD stage 3.Serum cr at 1,34 with K at 4,4and serum bicarbonate at 24.Urine output over last 24 hours not recorded, will order strict and outs monitoring.   4. Paroxysmal atrial fibrillation.Now on amiodarone infusion, for uncontrolled ventricular response. Will continue telemetry monitoring in the ICU. If recurrent may need urgent electrical cardioversion.   5. T2DM.Glucose continue to be well controlled. Not on insulin therapy.   6. Sinus congestion/ acute sinusitis.Continue lonase and loratadine.    DVT prophylaxis:heparin IV Code Status:full Family Communication:no family at the bedside Disposition Plan/ discharge barriers:Pending CABG.    Consultants:  Cardiology   CT surgery  Procedures:    Antimicrobials:      Subjective: Patient had recurrent chest pain/ angina despite nitroglycerin drip, developed atrial fibrillation with rapid ventricular response, needing amiodarone infusion.   Objective: Vitals:   07/31/18 1008 07/31/18 1012 07/31/18 1018 07/31/18 1023  BP: 136/76 134/68 116/66 (!) 109/59  Pulse: (!) 115 (!) 108 83 91  Resp:      Temp:      TempSrc:      SpO2: 90% 94% 96% 97%  Weight:      Height:        Intake/Output Summary (Last 24 hours) at 07/31/2018 1025 Last data filed at 07/30/2018  1700 Gross per 24 hour  Intake 480 ml  Output -  Net 480 ml   Filed Weights   07/29/18 0500  07/30/18 0536 07/31/18 0318  Weight: 84.9 kg 84.9 kg 85.3 kg    Examination:   General: deconditioned and ill looking appearing Neurology: Awake and alert, non focal  E ENT: mild pallor, no icterus, oral mucosa moist Cardiovascular: No JVD. S1-S2 present, irregularly irregular with no gallops, rubs, or murmurs. Trace lower extremity edema. Pulmonary: positive breath sounds bilaterally, adequate air movement, no wheezing, rhonchi or rales. Gastrointestinal. Abdomen distended no organomegaly, non tender, no rebound or guarding Skin. No rashes Musculoskeletal: no joint deformities     Data Reviewed: I have personally reviewed following labs and imaging studies  CBC: Recent Labs  Lab 07/26/18 0055 07/27/18 0531 07/28/18 0408 07/29/18 0226 07/30/18 0303 07/31/18 0332  WBC 7.2 7.0 7.3 8.0 10.9* 9.2  NEUTROABS 4.7  --   --   --   --   --   HGB 10.0* 10.5* 10.3* 10.2* 10.0* 9.7*  HCT 31.2* 32.8* 31.3* 30.4* 30.2* 29.3*  MCV 94.0 92.1 91.5 89.1 89.9 90.7  PLT 187 206 186 173 208 662   Basic Metabolic Panel: Recent Labs  Lab 07/27/18 0939 07/28/18 0408 07/29/18 0226 07/30/18 0303 07/31/18 0332  NA 129* 128* 124* 124* 126*  K 4.2 4.7 4.4 4.7 4.4  CL 94* 95* 92* 92* 94*  CO2 25 24 23 22 24   GLUCOSE 96 91 99 105* 99  BUN 15 13 13 12 14   CREATININE 1.43* 1.29* 1.16 1.27* 1.34*  CALCIUM 9.0 9.1 8.9 8.9 9.0   GFR: Estimated Creatinine Clearance: 49.2 mL/min (A) (by C-G formula based on SCr of 1.34 mg/dL (H)). Liver Function Tests: Recent Labs  Lab 07/30/18 0303  AST 39  ALT 50*  ALKPHOS 63  BILITOT 0.7  PROT 6.4*  ALBUMIN 3.8   No results for input(s): LIPASE, AMYLASE in the last 168 hours. No results for input(s): AMMONIA in the last 168 hours. Coagulation Profile: Recent Labs  Lab 07/26/18 0055  INR 1.31   Cardiac Enzymes: Recent Labs  Lab 07/26/18 0804 07/26/18 1401 07/30/18 0303 07/30/18 0918 07/30/18 1436  TROPONINI 0.03* <0.03 <0.03 <0.03 <0.03     BNP (last 3 results) No results for input(s): PROBNP in the last 8760 hours. HbA1C: No results for input(s): HGBA1C in the last 72 hours. CBG: Recent Labs  Lab 07/27/18 0754 07/27/18 1211 07/27/18 1941 07/27/18 2344 07/28/18 0531  GLUCAP 87 86 121* 98 94   Lipid Profile: No results for input(s): CHOL, HDL, LDLCALC, TRIG, CHOLHDL, LDLDIRECT in the last 72 hours. Thyroid Function Tests: No results for input(s): TSH, T4TOTAL, FREET4, T3FREE, THYROIDAB in the last 72 hours. Anemia Panel: No results for input(s): VITAMINB12, FOLATE, FERRITIN, TIBC, IRON, RETICCTPCT in the last 72 hours.    Radiology Studies: I have reviewed all of the imaging during this hospital visit personally     Scheduled Meds: . amiodarone  200 mg Oral Daily  . atorvastatin  20 mg Oral q1800  . chlorpheniramine-HYDROcodone  5 mL Oral Q12H  . cholecalciferol  1,000 Units Oral QPM  . fluticasone  2 spray Each Nare BID  . [START ON 08/01/2018] heparin-papaverine-plasmalyte irrigation   Irrigation To OR  . isosorbide mononitrate  60 mg Oral Daily  . loratadine  10 mg Oral Daily  . [START ON 08/01/2018] magnesium sulfate  40 mEq Other To OR  . metoprolol tartrate  25 mg  Oral BID  . multivitamin with minerals  1 tablet Oral QHS  . [START ON 08/01/2018] phenylephrine  30-200 mcg/min Intravenous To OR  . [START ON 08/01/2018] potassium chloride  80 mEq Other To OR  . sodium chloride flush  3 mL Intravenous Q12H  . sodium chloride  2 g Oral TID WC  . [START ON 08/01/2018] tranexamic acid  15 mg/kg Intravenous To OR  . [START ON 08/01/2018] tranexamic acid  2 mg/kg Intracatheter To OR   Continuous Infusions: . sodium chloride    . [START ON 08/01/2018] cefUROXime (ZINACEF)  IV    . [START ON 08/01/2018] cefUROXime (ZINACEF)  IV    . [START ON 08/01/2018] dexmedetomidine    . [START ON 08/01/2018] DOPamine    . [START ON 08/01/2018] epinephrine    . [START ON 08/01/2018] heparin 30,000 units/NS 1000 mL solution  for CELLSAVER    . heparin 1,050 Units/hr (07/31/18 0731)  . [START ON 08/01/2018] insulin (NOVOLIN-R) infusion    . [START ON 08/01/2018] milrinone    . nitroGLYCERIN 15 mcg/min (07/31/18 1015)  . [START ON 08/01/2018] nitroGLYCERIN    . [START ON 08/01/2018] tranexamic acid (CYKLOKAPRON) infusion ([facility])    . [START ON 08/01/2018] vancomycin       LOS: 4 days        Mauricio Gerome Apley, MD [facility] Pager [phone]

## 2018-07-31 NOTE — Progress Notes (Addendum)
Critical Care Note   Suddenly developed angina despite being on IV nitroglycerin.  Assessment revealed atrial fib with rapid ventricular response.  Blood pressure 120/80 mmHg and heart rate 128 bpm  IV amiodarone 150 mg x 1 and repeat in 15 minutes if A. fib continues.  IV Lopressor 5 mg q. 10 minutes to slow heart rate less than 100.  Transfer to ICU as he may need emergency electrical cardioversion.  Notify TCTS, as patient may need urgent surgery if angina continues after rhythm converts  Critical care time 40 minutes

## 2018-08-01 ENCOUNTER — Inpatient Hospital Stay (HOSPITAL_COMMUNITY): Payer: BLUE CROSS/BLUE SHIELD | Admitting: Certified Registered Nurse Anesthetist

## 2018-08-01 ENCOUNTER — Inpatient Hospital Stay (HOSPITAL_COMMUNITY): Payer: BLUE CROSS/BLUE SHIELD

## 2018-08-01 ENCOUNTER — Encounter (HOSPITAL_COMMUNITY)
Admission: EM | Disposition: A | Discharge status: Home / Self Care | Payer: Self-pay | Source: Home / Self Care | Attending: Cardiothoracic Surgery

## 2018-08-01 DIAGNOSIS — Z951 Presence of aortocoronary bypass graft: Secondary | ICD-10-CM

## 2018-08-01 DIAGNOSIS — I2510 Atherosclerotic heart disease of native coronary artery without angina pectoris: Secondary | ICD-10-CM

## 2018-08-01 HISTORY — PX: CORONARY ARTERY BYPASS GRAFT: SHX141

## 2018-08-01 HISTORY — PX: TEE WITHOUT CARDIOVERSION: SHX5443

## 2018-08-01 HISTORY — PX: CLIPPING OF ATRIAL APPENDAGE: SHX5773

## 2018-08-01 HISTORY — PX: BIOPSY: SHX5522

## 2018-08-01 LAB — POCT I-STAT, CHEM 8
BUN: 15 mg/dL (ref 8–23)
BUN: 13 mg/dL (ref 8–23)
BUN: 14 mg/dL (ref 8–23)
BUN: 14 mg/dL (ref 8–23)
BUN: 15 mg/dL (ref 8–23)
BUN: 16 mg/dL (ref 8–23)
BUN: 14 mg/dL (ref 8–23)
CALCIUM ION: 1.1 mmol/L — AB (ref 1.15–1.40)
CALCIUM ION: 1.14 mmol/L — AB (ref 1.15–1.40)
CHLORIDE: 93 mmol/L — AB (ref 98–111)
CHLORIDE: 95 mmol/L — AB (ref 98–111)
CHLORIDE: 94 mmol/L — AB (ref 98–111)
CHLORIDE: 95 mmol/L — AB (ref 98–111)
CREATININE: 1.2 mg/dL (ref 0.61–1.24)
CREATININE: 1.1 mg/dL (ref 0.61–1.24)
CREATININE: 1.1 mg/dL (ref 0.61–1.24)
CREATININE: 1.1 mg/dL (ref 0.61–1.24)
Calcium, Ion: 1.27 mmol/L (ref 1.15–1.40)
Calcium, Ion: 1.22 mmol/L (ref 1.15–1.40)
Calcium, Ion: 1.15 mmol/L (ref 1.15–1.40)
Calcium, Ion: 1.19 mmol/L (ref 1.15–1.40)
Calcium, Ion: 1.16 mmol/L (ref 1.15–1.40)
Chloride: 93 mmol/L — ABNORMAL LOW (ref 98–111)
Chloride: 96 mmol/L — ABNORMAL LOW (ref 98–111)
Chloride: 97 mmol/L — ABNORMAL LOW (ref 98–111)
Creatinine, Ser: 0.9 mg/dL (ref 0.61–1.24)
Creatinine, Ser: 1 mg/dL (ref 0.61–1.24)
Creatinine, Ser: 1.2 mg/dL (ref 0.61–1.24)
GLUCOSE: 112 mg/dL — AB (ref 70–99)
GLUCOSE: 173 mg/dL — AB (ref 70–99)
GLUCOSE: 158 mg/dL — AB (ref 70–99)
GLUCOSE: 118 mg/dL — AB (ref 70–99)
Glucose, Bld: 116 mg/dL — ABNORMAL HIGH (ref 70–99)
Glucose, Bld: 118 mg/dL — ABNORMAL HIGH (ref 70–99)
Glucose, Bld: 156 mg/dL — ABNORMAL HIGH (ref 70–99)
HCT: 22 % — ABNORMAL LOW (ref 39.0–52.0)
HCT: 23 % — ABNORMAL LOW (ref 39.0–52.0)
HCT: 26 % — ABNORMAL LOW (ref 39.0–52.0)
HEMATOCRIT: 26 % — AB (ref 39.0–52.0)
HEMATOCRIT: 25 % — AB (ref 39.0–52.0)
HEMATOCRIT: 26 % — AB (ref 39.0–52.0)
HEMATOCRIT: 31 % — AB (ref 39.0–52.0)
HEMOGLOBIN: 7.5 g/dL — AB (ref 13.0–17.0)
HEMOGLOBIN: 10.5 g/dL — AB (ref 13.0–17.0)
Hemoglobin: 8.8 g/dL — ABNORMAL LOW (ref 13.0–17.0)
Hemoglobin: 7.8 g/dL — ABNORMAL LOW (ref 13.0–17.0)
Hemoglobin: 8.5 g/dL — ABNORMAL LOW (ref 13.0–17.0)
Hemoglobin: 8.8 g/dL — ABNORMAL LOW (ref 13.0–17.0)
Hemoglobin: 8.8 g/dL — ABNORMAL LOW (ref 13.0–17.0)
POTASSIUM: 4.2 mmol/L (ref 3.5–5.1)
POTASSIUM: 4.4 mmol/L (ref 3.5–5.1)
POTASSIUM: 5.1 mmol/L (ref 3.5–5.1)
POTASSIUM: 4.6 mmol/L (ref 3.5–5.1)
POTASSIUM: 4.9 mmol/L (ref 3.5–5.1)
Potassium: 4.5 mmol/L (ref 3.5–5.1)
Potassium: 4.8 mmol/L (ref 3.5–5.1)
SODIUM: 127 mmol/L — AB (ref 135–145)
SODIUM: 128 mmol/L — AB (ref 135–145)
SODIUM: 128 mmol/L — AB (ref 135–145)
Sodium: 128 mmol/L — ABNORMAL LOW (ref 135–145)
Sodium: 127 mmol/L — ABNORMAL LOW (ref 135–145)
Sodium: 130 mmol/L — ABNORMAL LOW (ref 135–145)
Sodium: 129 mmol/L — ABNORMAL LOW (ref 135–145)
TCO2: 25 mmol/L (ref 22–32)
TCO2: 26 mmol/L (ref 22–32)
TCO2: 26 mmol/L (ref 22–32)
TCO2: 27 mmol/L (ref 22–32)
TCO2: 25 mmol/L (ref 22–32)
TCO2: 24 mmol/L (ref 22–32)
TCO2: 22 mmol/L (ref 22–32)

## 2018-08-01 LAB — POCT I-STAT 4, (NA,K, GLUC, HGB,HCT)
Glucose, Bld: 129 mg/dL — ABNORMAL HIGH (ref 70–99)
HCT: 30 % — ABNORMAL LOW (ref 39.0–52.0)
Hemoglobin: 10.2 g/dL — ABNORMAL LOW (ref 13.0–17.0)
Potassium: 4.5 mmol/L (ref 3.5–5.1)
Sodium: 131 mmol/L — ABNORMAL LOW (ref 135–145)

## 2018-08-01 LAB — CBC
HCT: 27.6 % — ABNORMAL LOW (ref 39.0–52.0)
HCT: 32.6 % — ABNORMAL LOW (ref 39.0–52.0)
HEMATOCRIT: 34.2 % — AB (ref 39.0–52.0)
Hemoglobin: 9.2 g/dL — ABNORMAL LOW (ref 13.0–17.0)
Hemoglobin: 11.3 g/dL — ABNORMAL LOW (ref 13.0–17.0)
Hemoglobin: 10.7 g/dL — ABNORMAL LOW (ref 13.0–17.0)
MCH: 30 pg (ref 26.0–34.0)
MCH: 30.1 pg (ref 26.0–34.0)
MCH: 29.6 pg (ref 26.0–34.0)
MCHC: 33.3 g/dL (ref 30.0–36.0)
MCHC: 33 g/dL (ref 30.0–36.0)
MCHC: 32.8 g/dL (ref 30.0–36.0)
MCV: 89.9 fL (ref 78.0–100.0)
MCV: 91 fL (ref 78.0–100.0)
MCV: 90.1 fL (ref 78.0–100.0)
PLATELETS: 207 10*3/uL (ref 150–400)
PLATELETS: 138 10*3/uL — AB (ref 150–400)
Platelets: 157 K/uL (ref 150–400)
RBC: 3.07 MIL/uL — AB (ref 4.22–5.81)
RBC: 3.76 MIL/uL — AB (ref 4.22–5.81)
RBC: 3.62 MIL/uL — ABNORMAL LOW (ref 4.22–5.81)
RDW: 13 % (ref 11.5–15.5)
RDW: 13.2 % (ref 11.5–15.5)
RDW: 13 % (ref 11.5–15.5)
WBC: 8.3 10*3/uL (ref 4.0–10.5)
WBC: 16.2 10*3/uL — AB (ref 4.0–10.5)
WBC: 13.9 K/uL — ABNORMAL HIGH (ref 4.0–10.5)

## 2018-08-01 LAB — GLUCOSE, CAPILLARY
GLUCOSE-CAPILLARY: 113 mg/dL — AB (ref 70–99)
GLUCOSE-CAPILLARY: 143 mg/dL — AB (ref 70–99)
GLUCOSE-CAPILLARY: 135 mg/dL — AB (ref 70–99)
Glucose-Capillary: 131 mg/dL — ABNORMAL HIGH (ref 70–99)
Glucose-Capillary: 111 mg/dL — ABNORMAL HIGH (ref 70–99)
Glucose-Capillary: 111 mg/dL — ABNORMAL HIGH (ref 70–99)
Glucose-Capillary: 126 mg/dL — ABNORMAL HIGH (ref 70–99)
Glucose-Capillary: 121 mg/dL — ABNORMAL HIGH (ref 70–99)
Glucose-Capillary: 134 mg/dL — ABNORMAL HIGH (ref 70–99)

## 2018-08-01 LAB — POCT I-STAT 3, ART BLOOD GAS (G3+)
ACID-BASE DEFICIT: 2 mmol/L (ref 0.0–2.0)
Acid-base deficit: 3 mmol/L — ABNORMAL HIGH (ref 0.0–2.0)
Acid-base deficit: 3 mmol/L — ABNORMAL HIGH (ref 0.0–2.0)
BICARBONATE: 24.1 mmol/L (ref 20.0–28.0)
BICARBONATE: 21.6 mmol/L (ref 20.0–28.0)
Bicarbonate: 25.1 mmol/L (ref 20.0–28.0)
Bicarbonate: 22.6 mmol/L (ref 20.0–28.0)
O2 SAT: 98 %
O2 Saturation: 100 %
O2 Saturation: 98 %
O2 Saturation: 97 %
PCO2 ART: 41.8 mmHg (ref 32.0–48.0)
PH ART: 7.386 (ref 7.350–7.450)
PH ART: 7.366 (ref 7.350–7.450)
PO2 ART: 438 mmHg — AB (ref 83.0–108.0)
PO2 ART: 96 mmHg (ref 83.0–108.0)
Patient temperature: 35.9
TCO2: 26 mmol/L (ref 22–32)
TCO2: 25 mmol/L (ref 22–32)
TCO2: 24 mmol/L (ref 22–32)
TCO2: 23 mmol/L (ref 22–32)
pCO2 arterial: 44.4 mmHg (ref 32.0–48.0)
pCO2 arterial: 41.6 mmHg (ref 32.0–48.0)
pCO2 arterial: 37.8 mmHg (ref 32.0–48.0)
pH, Arterial: 7.342 — ABNORMAL LOW (ref 7.350–7.450)
pH, Arterial: 7.337 — ABNORMAL LOW (ref 7.350–7.450)
pO2, Arterial: 107 mmHg (ref 83.0–108.0)
pO2, Arterial: 99 mmHg (ref 83.0–108.0)

## 2018-08-01 LAB — PLATELET COUNT: Platelets: 136 10*3/uL — ABNORMAL LOW (ref 150–400)

## 2018-08-01 LAB — OSMOLALITY, URINE: OSMOLALITY UR: 630 mosm/kg (ref 300–900)

## 2018-08-01 LAB — PROTIME-INR
INR: 1.35
Prothrombin Time: 16.6 s — ABNORMAL HIGH (ref 11.4–15.2)

## 2018-08-01 LAB — CREATININE, SERUM
Creatinine, Ser: 1.24 mg/dL (ref 0.61–1.24)
GFR calc Af Amer: >60 mL/min
GFR calc non Af Amer: 54 mL/min — ABNORMAL LOW

## 2018-08-01 LAB — BASIC METABOLIC PANEL
Anion gap: 8 (ref 5–15)
BUN: 16 mg/dL (ref 8–23)
CO2: 24 mmol/L (ref 22–32)
Calcium: 8.6 mg/dL — ABNORMAL LOW (ref 8.9–10.3)
Chloride: 96 mmol/L — ABNORMAL LOW (ref 98–111)
Creatinine, Ser: 1.28 mg/dL — ABNORMAL HIGH (ref 0.61–1.24)
GFR calc Af Amer: >60 mL/min (ref >60)
GFR calc non Af Amer: 52 mL/min — ABNORMAL LOW (ref >60)
Glucose, Bld: 118 mg/dL — ABNORMAL HIGH (ref 70–99)
Potassium: 4.7 mmol/L (ref 3.5–5.1)
Sodium: 128 mmol/L — ABNORMAL LOW (ref 135–145)

## 2018-08-01 LAB — SODIUM, URINE, RANDOM: SODIUM UR: 114 mmol/L

## 2018-08-01 LAB — HEMOGLOBIN AND HEMATOCRIT, BLOOD
HCT: 26.3 % — ABNORMAL LOW (ref 39.0–52.0)
Hemoglobin: 8.7 g/dL — ABNORMAL LOW (ref 13.0–17.0)

## 2018-08-01 LAB — APTT: aPTT: 38 s — ABNORMAL HIGH (ref 24–36)

## 2018-08-01 LAB — HEPARIN LEVEL (UNFRACTIONATED): HEPARIN UNFRACTIONATED: 0.15 [IU]/mL — AB (ref 0.30–0.70)

## 2018-08-01 LAB — MAGNESIUM: Magnesium: 2.9 mg/dL — ABNORMAL HIGH (ref 1.7–2.4)

## 2018-08-01 SURGERY — CORONARY ARTERY BYPASS GRAFTING (CABG)
Anesthesia: General | Site: Chest

## 2018-08-01 MED ORDER — MIDAZOLAM HCL 10 MG/2ML IJ SOLN
INTRAMUSCULAR | Status: AC
  Filled 2018-08-01: qty 2

## 2018-08-01 MED ORDER — ACETAMINOPHEN 160 MG/5ML PO SOLN: 1000.0000 mg | Freq: Four times a day (QID) | ORAL | Status: DC

## 2018-08-01 MED ORDER — ALBUTEROL SULFATE HFA 108 (90 BASE) MCG/ACT IN AERS
INHALATION_SPRAY | RESPIRATORY_TRACT | Status: DC | PRN
  Administered 2018-08-01: 6 via RESPIRATORY_TRACT

## 2018-08-01 MED ORDER — CHLORHEXIDINE GLUCONATE CLOTH 2 % EX PADS
6.0000 | MEDICATED_PAD | Freq: Every day | CUTANEOUS | Status: DC
  Administered 2018-08-01 – 2018-08-04 (×4): 6 via TOPICAL

## 2018-08-01 MED ORDER — PROTAMINE SULFATE 10 MG/ML IV SOLN
INTRAVENOUS | Status: AC
  Filled 2018-08-01: qty 5

## 2018-08-01 MED ORDER — INSULIN REGULAR BOLUS VIA INFUSION
0.0000 [IU] | Freq: Three times a day (TID) | INTRAVENOUS | Status: DC
  Filled 2018-08-01: qty 10

## 2018-08-01 MED ORDER — MIDAZOLAM HCL 2 MG/2ML IJ SOLN: 2.0000 mg | INTRAMUSCULAR | Status: DC | PRN

## 2018-08-01 MED ORDER — SODIUM CHLORIDE 0.9 % IV BOLUS
250.0000 mL | Freq: Once | INTRAVENOUS | Status: AC
  Administered 2018-08-01: 250 mL via INTRAVENOUS

## 2018-08-01 MED ORDER — ACETAMINOPHEN 650 MG RE SUPP
650.0000 mg | Freq: Once | RECTAL | Status: AC
  Administered 2018-08-01: 650 mg via RECTAL

## 2018-08-01 MED ORDER — MORPHINE SULFATE (PF) 2 MG/ML IV SOLN: 1.0000 mg | INTRAVENOUS | Status: AC | PRN

## 2018-08-01 MED ORDER — METHYLPREDNISOLONE SODIUM SUCC 125 MG IJ SOLR
80.0000 mg | Freq: Once | INTRAMUSCULAR | Status: AC
  Administered 2018-08-01: 80 mg via INTRAVENOUS
  Filled 2018-08-01: qty 2

## 2018-08-01 MED ORDER — ROCURONIUM BROMIDE 10 MG/ML (PF) SYRINGE
PREFILLED_SYRINGE | INTRAVENOUS | Status: DC | PRN
  Administered 2018-08-01 (×4): 50 mg via INTRAVENOUS

## 2018-08-01 MED ORDER — LACTATED RINGERS IV SOLN
INTRAVENOUS | Status: DC | PRN
  Administered 2018-08-01: 07:00:00 via INTRAVENOUS

## 2018-08-01 MED ORDER — METOPROLOL TARTRATE 5 MG/5ML IV SOLN
2.5000 mg | INTRAVENOUS | Status: DC | PRN
  Administered 2018-08-02: 2.5 mg via INTRAVENOUS

## 2018-08-01 MED ORDER — METOPROLOL TARTRATE 25 MG/10 ML ORAL SUSPENSION: 12.5000 mg | Freq: Two times a day (BID) | ORAL | Status: DC

## 2018-08-01 MED ORDER — LACTATED RINGERS IV SOLN: 500.0000 mL | Freq: Once | INTRAVENOUS | Status: DC | PRN

## 2018-08-01 MED ORDER — PROPOFOL 10 MG/ML IV BOLUS
INTRAVENOUS | Status: DC | PRN
  Administered 2018-08-01: 90 mg via INTRAVENOUS

## 2018-08-01 MED ORDER — BISACODYL 10 MG RE SUPP: 10.0000 mg | Freq: Every day | RECTAL | Status: DC

## 2018-08-01 MED ORDER — SODIUM CHLORIDE 0.9 % IV SOLN
INTRAVENOUS | Status: DC | PRN
  Administered 2018-08-01: 13:00:00 via INTRAVENOUS

## 2018-08-01 MED ORDER — CHLORHEXIDINE GLUCONATE 0.12 % MT SOLN
OROMUCOSAL | Status: AC
  Administered 2018-08-01: 15 mL via OROMUCOSAL
  Filled 2018-08-01: qty 15

## 2018-08-01 MED ORDER — SODIUM CHLORIDE 0.45 % IV SOLN: INTRAVENOUS | Status: DC | PRN

## 2018-08-01 MED ORDER — LACTATED RINGERS IV SOLN
INTRAVENOUS | Status: DC
  Administered 2018-08-02: 01:00:00 via INTRAVENOUS

## 2018-08-01 MED ORDER — SODIUM CHLORIDE 0.9 % IJ SOLN
OROMUCOSAL | Status: DC | PRN
  Administered 2018-08-01 (×2): via TOPICAL

## 2018-08-01 MED ORDER — DOPAMINE-DEXTROSE 3.2-5 MG/ML-% IV SOLN: 2.5000 ug/kg/min | INTRAVENOUS | Status: AC

## 2018-08-01 MED ORDER — BISACODYL 5 MG PO TBEC
10.0000 mg | DELAYED_RELEASE_TABLET | Freq: Every day | ORAL | Status: DC
  Administered 2018-08-02 – 2018-08-03 (×2): 10 mg via ORAL
  Filled 2018-08-01 (×2): qty 2

## 2018-08-01 MED ORDER — PLASMA-LYTE 148 IV SOLN
INTRAVENOUS | Status: DC | PRN
  Administered 2018-08-01: 500 mL via INTRAVASCULAR

## 2018-08-01 MED ORDER — ACETAMINOPHEN 500 MG PO TABS
1000.0000 mg | ORAL_TABLET | Freq: Four times a day (QID) | ORAL | Status: DC
  Administered 2018-08-02 – 2018-08-04 (×10): 1000 mg via ORAL
  Filled 2018-08-01 (×10): qty 2

## 2018-08-01 MED ORDER — ALBUMIN HUMAN 5 % IV SOLN
250.0000 mL | INTRAVENOUS | Status: AC | PRN
  Administered 2018-08-01 (×2): 12.5 g via INTRAVENOUS

## 2018-08-01 MED ORDER — HEPARIN SODIUM (PORCINE) 1000 UNIT/ML IJ SOLN
INTRAMUSCULAR | Status: DC | PRN
  Administered 2018-08-01: 30000 [IU] via INTRAVENOUS

## 2018-08-01 MED ORDER — 0.9 % SODIUM CHLORIDE (POUR BTL) OPTIME
TOPICAL | Status: DC | PRN
  Administered 2018-08-01: 6000 mL

## 2018-08-01 MED ORDER — DEXMEDETOMIDINE HCL IN NACL 200 MCG/50ML IV SOLN
0.0000 ug/kg/h | INTRAVENOUS | Status: DC
  Administered 2018-08-01: 0.5 ug/kg/h via INTRAVENOUS
  Filled 2018-08-01: qty 50

## 2018-08-01 MED ORDER — FENTANYL CITRATE (PF) 250 MCG/5ML IJ SOLN
INTRAMUSCULAR | Status: AC
  Filled 2018-08-01: qty 5

## 2018-08-01 MED ORDER — ALBUTEROL SULFATE HFA 108 (90 BASE) MCG/ACT IN AERS
INHALATION_SPRAY | RESPIRATORY_TRACT | Status: AC
  Filled 2018-08-01: qty 6.7

## 2018-08-01 MED ORDER — VANCOMYCIN HCL IN DEXTROSE 1-5 GM/200ML-% IV SOLN
1000.0000 mg | Freq: Once | INTRAVENOUS | Status: AC
  Administered 2018-08-01: 1000 mg via INTRAVENOUS
  Filled 2018-08-01: qty 200

## 2018-08-01 MED ORDER — SODIUM CHLORIDE 0.9 % IV SOLN
INTRAVENOUS | Status: DC
  Administered 2018-08-02: 3.2 [IU]/h via INTRAVENOUS
  Filled 2018-08-01 (×2): qty 1

## 2018-08-01 MED ORDER — DOCUSATE SODIUM 100 MG PO CAPS
200.0000 mg | ORAL_CAPSULE | Freq: Every day | ORAL | Status: DC
  Administered 2018-08-02 – 2018-08-03 (×2): 200 mg via ORAL
  Filled 2018-08-01 (×2): qty 2

## 2018-08-01 MED ORDER — CHLORHEXIDINE GLUCONATE 0.12 % MT SOLN
15.0000 mL | OROMUCOSAL | Status: AC
  Administered 2018-08-01: 15 mL via OROMUCOSAL

## 2018-08-01 MED ORDER — MIDAZOLAM HCL 5 MG/5ML IJ SOLN
INTRAMUSCULAR | Status: DC | PRN
  Administered 2018-08-01: 1 mg via INTRAVENOUS
  Administered 2018-08-01: 2 mg via INTRAVENOUS
  Administered 2018-08-01: 3 mg via INTRAVENOUS

## 2018-08-01 MED ORDER — PANTOPRAZOLE SODIUM 40 MG PO TBEC
40.0000 mg | DELAYED_RELEASE_TABLET | Freq: Every day | ORAL | Status: DC
  Administered 2018-08-03 – 2018-08-04 (×2): 40 mg via ORAL
  Filled 2018-08-01 (×2): qty 1

## 2018-08-01 MED ORDER — LEVALBUTEROL HCL 0.63 MG/3ML IN NEBU
0.6300 mg | INHALATION_SOLUTION | Freq: Four times a day (QID) | RESPIRATORY_TRACT | Status: DC
  Administered 2018-08-01 – 2018-08-04 (×11): 0.63 mg via RESPIRATORY_TRACT
  Filled 2018-08-01 (×10): qty 3

## 2018-08-01 MED ORDER — PROPOFOL 10 MG/ML IV BOLUS
INTRAVENOUS | Status: AC
  Filled 2018-08-01: qty 20

## 2018-08-01 MED ORDER — FAMOTIDINE IN NACL 20-0.9 MG/50ML-% IV SOLN
20.0000 mg | Freq: Two times a day (BID) | INTRAVENOUS | Status: AC
  Administered 2018-08-01: 20 mg via INTRAVENOUS
  Filled 2018-08-01: qty 50

## 2018-08-01 MED ORDER — SODIUM CHLORIDE 0.9% FLUSH
10.0000 mL | Freq: Two times a day (BID) | INTRAVENOUS | Status: DC
  Administered 2018-08-02 (×2): 10 mL

## 2018-08-01 MED ORDER — OXYCODONE HCL 5 MG PO TABS: 5.0000 mg | ORAL_TABLET | ORAL | Status: DC | PRN

## 2018-08-01 MED ORDER — MAGNESIUM SULFATE 4 GM/100ML IV SOLN
INTRAVENOUS | Status: AC
  Administered 2018-08-01: 4 g via INTRAVENOUS
  Filled 2018-08-01: qty 100

## 2018-08-01 MED ORDER — CHLORHEXIDINE GLUCONATE 0.12% ORAL RINSE (MEDLINE KIT)
15.0000 mL | Freq: Two times a day (BID) | OROMUCOSAL | Status: DC
  Administered 2018-08-01 – 2018-08-02 (×2): 15 mL via OROMUCOSAL

## 2018-08-01 MED ORDER — SODIUM CHLORIDE 0.9 % IV SOLN
INTRAVENOUS | Status: DC
  Administered 2018-08-01: 14:00:00 via INTRAVENOUS

## 2018-08-01 MED ORDER — MAGNESIUM SULFATE 4 GM/100ML IV SOLN
4.0000 g | Freq: Once | INTRAVENOUS | Status: AC
  Administered 2018-08-01: 4 g via INTRAVENOUS

## 2018-08-01 MED ORDER — ASPIRIN 81 MG PO CHEW
324.0000 mg | CHEWABLE_TABLET | Freq: Every day | ORAL | Status: DC
  Administered 2018-08-03: 324 mg
  Filled 2018-08-01: qty 4

## 2018-08-01 MED ORDER — METOCLOPRAMIDE HCL 5 MG/ML IJ SOLN
10.0000 mg | Freq: Four times a day (QID) | INTRAMUSCULAR | Status: AC
  Administered 2018-08-01 – 2018-08-02 (×4): 10 mg via INTRAVENOUS
  Filled 2018-08-01 (×3): qty 2

## 2018-08-01 MED ORDER — HEMOSTATIC AGENTS (NO CHARGE) OPTIME
TOPICAL | Status: DC | PRN
  Administered 2018-08-01: 1 via TOPICAL

## 2018-08-01 MED ORDER — LACTATED RINGERS IV SOLN: INTRAVENOUS | Status: DC | PRN

## 2018-08-01 MED ORDER — LEVALBUTEROL HCL 0.63 MG/3ML IN NEBU
INHALATION_SOLUTION | RESPIRATORY_TRACT | Status: AC
  Filled 2018-08-01: qty 3

## 2018-08-01 MED ORDER — SODIUM CHLORIDE 0.9% FLUSH: 10.0000 mL | INTRAVENOUS | Status: DC | PRN

## 2018-08-01 MED ORDER — TRAMADOL HCL 50 MG PO TABS: 50.0000 mg | ORAL_TABLET | ORAL | Status: DC | PRN

## 2018-08-01 MED ORDER — ASPIRIN EC 325 MG PO TBEC
325.0000 mg | DELAYED_RELEASE_TABLET | Freq: Every day | ORAL | Status: DC
  Administered 2018-08-02 – 2018-08-04 (×2): 325 mg via ORAL
  Filled 2018-08-01 (×2): qty 1

## 2018-08-01 MED ORDER — PROTAMINE SULFATE 10 MG/ML IV SOLN
INTRAVENOUS | Status: AC
  Filled 2018-08-01: qty 25

## 2018-08-01 MED ORDER — NITROGLYCERIN IN D5W 200-5 MCG/ML-% IV SOLN: 0.0000 ug/min | INTRAVENOUS | Status: DC

## 2018-08-01 MED ORDER — LACTATED RINGERS IV SOLN: INTRAVENOUS | Status: DC

## 2018-08-01 MED ORDER — SODIUM CHLORIDE 0.9 % IV SOLN
1.5000 g | Freq: Two times a day (BID) | INTRAVENOUS | Status: AC
  Administered 2018-08-01 – 2018-08-03 (×4): 1.5 g via INTRAVENOUS
  Filled 2018-08-01 (×4): qty 1.5

## 2018-08-01 MED ORDER — FENTANYL CITRATE (PF) 250 MCG/5ML IJ SOLN
INTRAMUSCULAR | Status: AC
  Filled 2018-08-01: qty 25

## 2018-08-01 MED ORDER — METOPROLOL TARTRATE 12.5 MG HALF TABLET
12.5000 mg | ORAL_TABLET | Freq: Two times a day (BID) | ORAL | Status: DC
  Administered 2018-08-02 (×2): 12.5 mg via ORAL
  Filled 2018-08-01 (×2): qty 1

## 2018-08-01 MED ORDER — SODIUM CHLORIDE 0.9% FLUSH: 3.0000 mL | INTRAVENOUS | Status: DC | PRN

## 2018-08-01 MED ORDER — SODIUM CHLORIDE 0.9% FLUSH
3.0000 mL | Freq: Two times a day (BID) | INTRAVENOUS | Status: DC
  Administered 2018-08-02 – 2018-08-04 (×5): 3 mL via INTRAVENOUS

## 2018-08-01 MED ORDER — POTASSIUM CHLORIDE 10 MEQ/50ML IV SOLN: 10.0000 meq | INTRAVENOUS | Status: AC

## 2018-08-01 MED ORDER — MORPHINE SULFATE (PF) 2 MG/ML IV SOLN
2.0000 mg | INTRAVENOUS | Status: DC | PRN
  Administered 2018-08-01 – 2018-08-03 (×4): 2 mg via INTRAVENOUS
  Filled 2018-08-01 (×4): qty 1

## 2018-08-01 MED ORDER — SODIUM CHLORIDE 0.9 % IV SOLN: 250.0000 mL | INTRAVENOUS | Status: DC

## 2018-08-01 MED ORDER — ONDANSETRON HCL 4 MG/2ML IJ SOLN
4.0000 mg | Freq: Four times a day (QID) | INTRAMUSCULAR | Status: DC | PRN
  Administered 2018-08-03: 4 mg via INTRAVENOUS
  Filled 2018-08-01: qty 2

## 2018-08-01 MED ORDER — HEPARIN SODIUM (PORCINE) 1000 UNIT/ML IJ SOLN
INTRAMUSCULAR | Status: AC
  Filled 2018-08-01: qty 1

## 2018-08-01 MED ORDER — ORAL CARE MOUTH RINSE
15.0000 mL | OROMUCOSAL | Status: DC
  Administered 2018-08-01 – 2018-08-02 (×7): 15 mL via OROMUCOSAL

## 2018-08-01 MED ORDER — ACETAMINOPHEN 160 MG/5ML PO SOLN: 650.0000 mg | Freq: Once | ORAL | Status: AC

## 2018-08-01 MED ORDER — ROCURONIUM BROMIDE 50 MG/5ML IV SOSY
PREFILLED_SYRINGE | INTRAVENOUS | Status: AC
  Filled 2018-08-01: qty 15

## 2018-08-01 MED ORDER — PHENYLEPHRINE HCL-NACL 20-0.9 MG/250ML-% IV SOLN
0.0000 ug/min | INTRAVENOUS | Status: DC
  Filled 2018-08-01: qty 250

## 2018-08-01 MED ORDER — ARTIFICIAL TEARS OPHTHALMIC OINT
TOPICAL_OINTMENT | OPHTHALMIC | Status: AC
  Filled 2018-08-01: qty 3.5

## 2018-08-01 MED ORDER — FENTANYL CITRATE (PF) 250 MCG/5ML IJ SOLN
INTRAMUSCULAR | Status: DC | PRN
  Administered 2018-08-01: 100 ug via INTRAVENOUS
  Administered 2018-08-01: 50 ug via INTRAVENOUS
  Administered 2018-08-01: 150 ug via INTRAVENOUS
  Administered 2018-08-01: 50 ug via INTRAVENOUS
  Administered 2018-08-01: 250 ug via INTRAVENOUS
  Administered 2018-08-01: 100 ug via INTRAVENOUS
  Administered 2018-08-01: 150 ug via INTRAVENOUS
  Administered 2018-08-01 (×3): 100 ug via INTRAVENOUS
  Administered 2018-08-01: 150 ug via INTRAVENOUS
  Administered 2018-08-01: 200 ug via INTRAVENOUS

## 2018-08-01 MED ORDER — FUROSEMIDE 10 MG/ML IJ SOLN
20.0000 mg | Freq: Once | INTRAMUSCULAR | Status: AC
  Administered 2018-08-01: 20 mg via INTRAVENOUS
  Filled 2018-08-01: qty 2

## 2018-08-01 MED ORDER — ROCURONIUM BROMIDE 50 MG/5ML IV SOSY
PREFILLED_SYRINGE | INTRAVENOUS | Status: AC
  Filled 2018-08-01: qty 5

## 2018-08-01 MED ORDER — ARTIFICIAL TEARS OPHTHALMIC OINT
TOPICAL_OINTMENT | OPHTHALMIC | Status: DC | PRN
  Administered 2018-08-01: 1 via OPHTHALMIC

## 2018-08-01 MED ORDER — PROTAMINE SULFATE 10 MG/ML IV SOLN
INTRAVENOUS | Status: DC | PRN
  Administered 2018-08-01: 300 mg via INTRAVENOUS

## 2018-08-01 MED FILL — Mannitol IV Soln 20%: INTRAVENOUS | Qty: 500 | Status: AC

## 2018-08-01 MED FILL — Electrolyte-R (PH 7.4) Solution: INTRAVENOUS | Qty: 3000 | Status: AC

## 2018-08-01 MED FILL — Lidocaine HCl(Cardiac) IV PF Soln Pref Syr 100 MG/5ML (2%): INTRAVENOUS | Qty: 5 | Status: AC

## 2018-08-01 MED FILL — Sodium Chloride IV Soln 0.9%: INTRAVENOUS | Qty: 2000 | Status: AC

## 2018-08-01 MED FILL — Heparin Sodium (Porcine) Inj 1000 Unit/ML: INTRAMUSCULAR | Qty: 10 | Status: AC

## 2018-08-01 MED FILL — Sodium Bicarbonate IV Soln 8.4%: INTRAVENOUS | Qty: 50 | Status: AC

## 2018-08-01 SURGICAL SUPPLY — 76 items
ATRICLIP EXCLUSION 50 STD HAND (Clip) ×5 IMPLANT
BAG DECANTER FOR FLEXI CONT (MISCELLANEOUS) ×5 IMPLANT
BANDAGE ACE 4X5 VEL STRL LF (GAUZE/BANDAGES/DRESSINGS) ×5 IMPLANT
BANDAGE ACE 6X5 VEL STRL LF (GAUZE/BANDAGES/DRESSINGS) ×5 IMPLANT
BANDAGE ELASTIC 6 VELCRO ST LF (GAUZE/BANDAGES/DRESSINGS) ×5 IMPLANT
BLADE STERNUM SYSTEM 6 (BLADE) ×5 IMPLANT
BNDG GAUZE ELAST 4 BULKY (GAUZE/BANDAGES/DRESSINGS) ×5 IMPLANT
CANISTER SUCT 3000ML PPV (MISCELLANEOUS) ×5 IMPLANT
CATH CPB KIT GERHARDT (MISCELLANEOUS) ×5 IMPLANT
CATH THORACIC 28FR (CATHETERS) ×5 IMPLANT
CONT SPEC 4OZ CLIKSEAL STRL BL (MISCELLANEOUS) ×20 IMPLANT
CRADLE DONUT ADULT HEAD (MISCELLANEOUS) ×5 IMPLANT
DERMABOND ADVANCED (GAUZE/BANDAGES/DRESSINGS) ×1
DERMABOND ADVANCED .7 DNX12 (GAUZE/BANDAGES/DRESSINGS) ×4 IMPLANT
DRAIN CHANNEL 28F RND 3/8 FF (WOUND CARE) ×5 IMPLANT
DRAPE CARDIOVASCULAR INCISE (DRAPES) ×1
DRAPE SLUSH/WARMER DISC (DRAPES) ×5 IMPLANT
DRAPE SRG 135X102X78XABS (DRAPES) ×4 IMPLANT
DRSG AQUACEL AG ADV 3.5X14 (GAUZE/BANDAGES/DRESSINGS) ×5 IMPLANT
ELECT BLADE 4.0 EZ CLEAN MEGAD (MISCELLANEOUS) ×5
ELECT REM PT RETURN 9FT ADLT (ELECTROSURGICAL) ×10
ELECTRODE BLDE 4.0 EZ CLN MEGD (MISCELLANEOUS) ×4 IMPLANT
ELECTRODE REM PT RTRN 9FT ADLT (ELECTROSURGICAL) ×8 IMPLANT
FELT TEFLON 1X6 (MISCELLANEOUS) ×5 IMPLANT
GAUZE SPONGE 4X4 12PLY STRL (GAUZE/BANDAGES/DRESSINGS) ×10 IMPLANT
GAUZE SPONGE 4X4 12PLY STRL LF (GAUZE/BANDAGES/DRESSINGS) ×10 IMPLANT
GLOVE BIO SURGEON STRL SZ 6.5 (GLOVE) ×30 IMPLANT
GLOVE BIOGEL PI IND STRL 6 (GLOVE) ×12 IMPLANT
GLOVE BIOGEL PI IND STRL 6.5 (GLOVE) ×12 IMPLANT
GLOVE BIOGEL PI INDICATOR 6 (GLOVE) ×3
GLOVE BIOGEL PI INDICATOR 6.5 (GLOVE) ×3
GOWN STRL REUS W/ TWL LRG LVL3 (GOWN DISPOSABLE) ×28 IMPLANT
GOWN STRL REUS W/TWL LRG LVL3 (GOWN DISPOSABLE) ×7
HEMOSTAT POWDER SURGIFOAM 1G (HEMOSTASIS) ×10 IMPLANT
HEMOSTAT SURGICEL 2X14 (HEMOSTASIS) ×5 IMPLANT
KIT BASIN OR (CUSTOM PROCEDURE TRAY) ×5 IMPLANT
KIT CATH SUCT 8FR (CATHETERS) ×5 IMPLANT
KIT SUCTION CATH 14FR (SUCTIONS) ×5 IMPLANT
KIT TURNOVER KIT B (KITS) ×5 IMPLANT
KIT VASOVIEW HEMOPRO VH 3000 (KITS) ×5 IMPLANT
LEAD PACING MYOCARDI (MISCELLANEOUS) ×5 IMPLANT
MARKER GRAFT CORONARY BYPASS (MISCELLANEOUS) ×15 IMPLANT
NS IRRIG 1000ML POUR BTL (IV SOLUTION) ×30 IMPLANT
PACK E OPEN HEART (SUTURE) ×5 IMPLANT
PACK OPEN HEART (CUSTOM PROCEDURE TRAY) ×5 IMPLANT
PAD ARMBOARD 7.5X6 YLW CONV (MISCELLANEOUS) ×10 IMPLANT
PAD ELECT DEFIB RADIOL ZOLL (MISCELLANEOUS) ×5 IMPLANT
PENCIL BUTTON HOLSTER BLD 10FT (ELECTRODE) ×5 IMPLANT
PUNCH AORTIC ROTATE  4.5MM 8IN (MISCELLANEOUS) ×5 IMPLANT
SET CARDIOPLEGIA MPS 5001102 (MISCELLANEOUS) ×5 IMPLANT
SPONGE LAP 18X18 RF (DISPOSABLE) ×10 IMPLANT
SPONGE LAP 18X18 X RAY DECT (DISPOSABLE) ×5 IMPLANT
SURGIFLO W/THROMBIN 8M KIT (HEMOSTASIS) ×5 IMPLANT
SUT BONE WAX W31G (SUTURE) ×5 IMPLANT
SUT MNCRL AB 4-0 PS2 18 (SUTURE) ×5 IMPLANT
SUT PROLENE 3 0 SH1 36 (SUTURE) ×5 IMPLANT
SUT PROLENE 4 0 TF (SUTURE) ×15 IMPLANT
SUT PROLENE 5 0 C 1 36 (SUTURE) ×5 IMPLANT
SUT PROLENE 6 0 CC (SUTURE) ×10 IMPLANT
SUT PROLENE 7 0 BV1 MDA (SUTURE) ×5 IMPLANT
SUT PROLENE 8 0 BV175 6 (SUTURE) ×15 IMPLANT
SUT SILK 2 0 SH CR/8 (SUTURE) ×5 IMPLANT
SUT STEEL 6MS V (SUTURE) ×5 IMPLANT
SUT STEEL SZ 6 DBL 3X14 BALL (SUTURE) ×5 IMPLANT
SUT VIC AB 1 CTX 18 (SUTURE) ×10 IMPLANT
SUT VIC AB 2-0 CT1 27 (SUTURE) ×1
SUT VIC AB 2-0 CT1 TAPERPNT 27 (SUTURE) ×4 IMPLANT
SYSTEM SAHARA CHEST DRAIN ATS (WOUND CARE) ×5 IMPLANT
TAPE CLOTH SURG 4X10 WHT LF (GAUZE/BANDAGES/DRESSINGS) ×10 IMPLANT
TAPE PAPER 2X10 WHT MICROPORE (GAUZE/BANDAGES/DRESSINGS) ×5 IMPLANT
TOWEL GREEN STERILE (TOWEL DISPOSABLE) ×5 IMPLANT
TOWEL GREEN STERILE FF (TOWEL DISPOSABLE) ×5 IMPLANT
TRAY FOLEY SLVR 16FR TEMP STAT (SET/KITS/TRAYS/PACK) ×5 IMPLANT
TUBING INSUFFLATION (TUBING) ×5 IMPLANT
UNDERPAD 30X30 (UNDERPADS AND DIAPERS) ×5 IMPLANT
WATER STERILE IRR 1000ML POUR (IV SOLUTION) ×10 IMPLANT

## 2018-08-01 NOTE — Progress Notes (Deleted)
Pt with low spo2 post OP. Recruitment maneuver performed x1 minute but was stopped due to low BP. Peep increased to 8, fio2 increased to 80%. Spo2 only improved to 96%. RT will wean as tolerated.

## 2018-08-01 NOTE — Anesthesia Preprocedure Evaluation (Signed)
Anesthesia Evaluation  Patient identified by MRN, date of birth, ID band Patient awake    Reviewed: Allergy & Precautions, H&P , NPO status , Patient's Chart, lab work & pertinent test results  Airway Mallampati: II   Neck ROM: full    Dental   Pulmonary former smoker,    breath sounds clear to auscultation       Cardiovascular hypertension, + CAD, + Past MI, + Cardiac Stents and +CHF  + dysrhythmias Atrial Fibrillation  Rhythm:regular Rate:Normal  EF 60%.    Neuro/Psych    GI/Hepatic   Endo/Other  diabetes  Renal/GU Renal InsufficiencyRenal disease     Musculoskeletal   Abdominal   Peds  Hematology  (+) anemia ,   Anesthesia Other Findings   Reproductive/Obstetrics                             Anesthesia Physical Anesthesia Plan  ASA: III  Anesthesia Plan: General   Post-op Pain Management:    Induction: Intravenous  PONV Risk Score and Plan: 2 and Ondansetron, Dexamethasone, Midazolam and Treatment may vary due to age or medical condition  Airway Management Planned: Oral ETT  Additional Equipment: Arterial line, CVP, PA Cath, TEE and Ultrasound Guidance Line Placement  Intra-op Plan:   Post-operative Plan: Extubation in OR  Informed Consent: I have reviewed the patients History and Physical, chart, labs and discussed the procedure including the risks, benefits and alternatives for the proposed anesthesia with the patient or authorized representative who has indicated his/her understanding and acceptance.     Plan Discussed with: CRNA, Anesthesiologist and Surgeon  Anesthesia Plan Comments:         Anesthesia Quick Evaluation

## 2018-08-01 NOTE — Progress Notes (Signed)
CT surgery p.m. Rounds  Patient remains sedated on ventilator after multi vessel CABG and biopsy of right hilar mass  A paced with stable hemodynamics Minimal chest tube output Continue current care

## 2018-08-01 NOTE — Anesthesia Procedure Notes (Signed)
Central Venous Catheter Insertion Performed by: Albertha Ghee, MD, anesthesiologist Start/End9/30/2019 7:03 AM, 08/01/2018 7:20 AM Patient location: Pre-op. Preanesthetic checklist: patient identified, IV checked, site marked, risks and benefits discussed, surgical consent, monitors and equipment checked, pre-op evaluation, timeout performed and anesthesia consent Position: Trendelenburg Lidocaine 1% used for infiltration and patient sedated Hand hygiene performed , maximum sterile barriers used  and Seldinger technique used Catheter size: 8.5 Fr Central line and PA cath was placed.Sheath introducer Swan type:thermodilation Procedure performed using ultrasound guided technique. Ultrasound Notes:anatomy identified, needle tip was noted to be adjacent to the nerve/plexus identified, no ultrasound evidence of intravascular and/or intraneural injection and image(s) printed for medical record Attempts: 1 Following insertion, line sutured, dressing applied and Biopatch. Post procedure assessment: blood return through all ports, free fluid flow and no air  Patient tolerated the procedure well with no immediate complications.

## 2018-08-01 NOTE — Progress Notes (Signed)
Paged Dr. Prescott Gum regarding pt's absence of cuff leak after rapid wean protocol. Updated on current VS, UOP, CO/CI, and most recent labs. Orders for 80 mg solumedrol now, 20 mg lasix, restarted precedex gtt and re-attempt wean in 1-2 hrs if cuff leak present.  Will initiate orders and continue to monitor closely.

## 2018-08-01 NOTE — Transfer of Care (Signed)
Immediate Anesthesia Transfer of Care Note  Patient: Gregory Bates  Procedure(s) Performed: CORONARY ARTERY BYPASS GRAFTING (CABG) TIMES THREE USING LEFT INTERNAL MAMMARY ARTERY TO LAD, RIGHT GREATER SAPHENOUS VEIN GRAFT TO OM AND RCA, VEIN HARVESTED ENDOSCOPICALLY (N/A Chest) TRANSESOPHAGEAL ECHOCARDIOGRAM (TEE) (N/A ) CLIPPING OF ATRIAL APPENDAGE (Left Chest) BIOPSY OF PERICARDIAL LYMPH NODE AND HILAR LYMPH NODE  Patient Location: ICU  Anesthesia Type:General  Level of Consciousness: sedated and Patient remains intubated per anesthesia plan  Airway & Oxygen Therapy: Patient remains intubated per anesthesia plan and Patient placed on Ventilator (see vital sign flow sheet for setting)  Post-op Assessment: Report given to RN and Post -op Vital signs reviewed and stable  Post vital signs: Reviewed and stable  Last Vitals:  Vitals Value Taken Time  BP    Temp    Pulse 90 08/01/2018  1:55 PM  Resp 12 08/01/2018  1:55 PM  SpO2 94 % 08/01/2018  1:55 PM  Vitals shown include unvalidated device data.  Last Pain:  Vitals:   08/01/18 0400  TempSrc: Oral  PainSc: Asleep      Patients Stated Pain Goal: 0 (87/27/61 8485)  Complications: No apparent anesthesia complications

## 2018-08-01 NOTE — Progress Notes (Signed)
  Echocardiogram 2D Echocardiogram has been performed.  Jennette Dubin 08/01/2018, 8:31 AM

## 2018-08-01 NOTE — Anesthesia Procedure Notes (Signed)
Procedure Name: Intubation Date/Time: 08/01/2018 7:48 AM Performed by: Candis Shine, CRNA Pre-anesthesia Checklist: Patient identified, Emergency Drugs available, Suction available and Patient being monitored Patient Re-evaluated:Patient Re-evaluated prior to induction Oxygen Delivery Method: Circle System Utilized Preoxygenation: Pre-oxygenation with 100% oxygen Induction Type: IV induction Ventilation: Mask ventilation without difficulty and Oral airway inserted - appropriate to patient size Laryngoscope Size: Mac and 4 Grade View: Grade I Tube type: Oral Tube size: 8.0 mm Number of attempts: 1 Airway Equipment and Method: Stylet and Oral airway Placement Confirmation: ETT inserted through vocal cords under direct vision,  positive ETCO2 and breath sounds checked- equal and bilateral Secured at: 22 cm Tube secured with: Tape Dental Injury: Teeth and Oropharynx as per pre-operative assessment

## 2018-08-01 NOTE — Progress Notes (Signed)
[address]       [place] [address]             [phone]     Patient ID: Gregory Bates, male   DOB: Dec 01, 1940, 77 y.o.   MRN: [id number] Pre Procedure note for inpatients:   Gregory Bates has been scheduled for Procedure(s): CORONARY ARTERY BYPASS GRAFTING (CABG) (N/A) TRANSESOPHAGEAL ECHOCARDIOGRAM (TEE) (N/A) today. The various methods of treatment have been discussed with the patient. After consideration of the risks, benefits and treatment options the patient has consented to the planned procedure.   The patient has been seen and labs reviewed. There are no changes in the patient's condition to prevent proceeding with the planned procedure today.  Recent labs:  Lab Results  Component Value Date   WBC 8.3 08/01/2018   HGB 9.2 (L) 08/01/2018   HCT 27.6 (L) 08/01/2018   PLT 207 08/01/2018   GLUCOSE 118 (H) 08/01/2018   CHOL 170 11/04/2013   TRIG 166 (H) 11/04/2013   HDL 34 (L) 11/04/2013   LDLDIRECT 69.3 11/26/2008   LDLCALC 103 (H) 11/04/2013   ALT 50 (H) 07/30/2018   AST 39 07/30/2018   NA 128 (L) 08/01/2018   K 4.7 08/01/2018   CL 96 (L) 08/01/2018   CREATININE 1.28 (H) 08/01/2018   BUN 16 08/01/2018   CO2 24 08/01/2018   TSH 0.628 10/15/2017   PSA 1.45 11/26/2008   INR 1.31 07/26/2018   HGBA1C 6.4 (H) 07/31/2018   MICROALBUR 1.5 08/21/2013  P2Y12 307  Ct Head Wo Contrast  Result Date: 07/30/2018 CLINICAL DATA:  Post heart catheterization now with subacute neurologic defects. EXAM: CT HEAD WITHOUT CONTRAST TECHNIQUE: Contiguous axial images were obtained from the base of the skull through the vertex without intravenous contrast. COMPARISON:  11/02/2017 FINDINGS: Brain: Similar findings of atrophy with centralized volume loss and mild commensurate ex vacuo dilatation the ventricular system. Re demonstrated scattered periventricular hypodensities, most conspicuous about the left centrum semiovale compatible with microvascular ischemic  disease. Gray-white differentiation is otherwise well maintained without CT evidence of superimposed acute large territory infarct. Known approximately 2.1 x 1 cm meningioma involving the left sphenoid wing is unchanged compared to the 11/2017 examination (coronal image 27, series 5). No intraparenchymal or extra-axial hemorrhage. Unchanged size and configuration of the ventricles and the basilar cisterns. No midline shift. Vascular: Intracranial atherosclerosis. Skull: Old fractures of the left maxillary sinus, orbit and zygomatic arch, improved compared to the 11/2017 examination. No acute displaced calvarial fracture. Sinuses/Orbits: Scattered opacification of the right anterior ethmoidal air cells. Mucosal thickening of the bilateral maxillary sinuses with small air-fluid level within the left maxillary sinus. Scattered opacification of the left mastoid air cells. Other: Regional soft tissues appear normal. IMPRESSION: 1. Similar findings of atrophy and microvascular ischemic disease without superimposed acute intracranial process. 2. Old fractures of the left maxillary sinus, orbit and zygomatic arch, improved compared to the 11/2017 examination. 3. Sinus disease as above, improved compared to the 11/2017 examination, though there is an air-fluid level within the left maxillary sinus as could be seen in the setting of acute sinusitis. 4. Grossly unchanged appearance of known approximately 2.1 cm left sphenoid wing calcified meningioma. Electronically Signed   By: Sandi Mariscal M.D.   On: 07/30/2018 13:34  With episodes of chest pain in hospital will proceed with CABG, poss atrial clip   and biopsy of nodes if assessable. No obvious mets on ct brain, was unable  to tolerate a mri of brain    The goals risks and alternatives of the planned surgical procedure Procedure(s): CORONARY ARTERY BYPASS GRAFTING (CABG), poss arial clip and nodes biopsy , TRANSESOPHAGEAL ECHOCARDIOGRAM (TEE)   have been discussed with the  patient in detail. The risks of the procedure including death, infection, stroke, myocardial infarction, bleeding, blood transfusion have all been discussed specifically.  I have quoted Gregory Bates a 5 % of perioperative mortality and a complication rate as high as 60 %. The patient's questions have been answered.Gregory Bates is willing  to proceed with the planned procedure.  Grace Isaac, MD 08/01/2018 7:10 AM

## 2018-08-01 NOTE — Anesthesia Procedure Notes (Signed)
Arterial Line Insertion Start/End9/30/2019 7:00 AM Performed by: Candis Shine, CRNA, CRNA  Patient location: Pre-op. Preanesthetic checklist: patient identified, IV checked, site marked, risks and benefits discussed, surgical consent, monitors and equipment checked, pre-op evaluation, timeout performed and anesthesia consent Lidocaine 1% used for infiltration Left, radial was placed Catheter size: 20 G Hand hygiene performed  and maximum sterile barriers used   Attempts: 2 Procedure performed without using ultrasound guided technique. Following insertion, dressing applied and Biopatch. Post procedure assessment: normal and unchanged  Patient tolerated the procedure well with no immediate complications.

## 2018-08-01 NOTE — Progress Notes (Signed)
CXR taken, Dr Servando Snare reviewed and is at bedside repositioning swan. New length is at 52

## 2018-08-01 NOTE — Brief Op Note (Signed)
      [address]       [place] [address]             [phone]      08/01/2018  2:25 PM  PATIENT:  Gregory Bates  77 y.o. male  PRE-OPERATIVE DIAGNOSIS:  CAD  POST-OPERATIVE DIAGNOSIS:  CAD, Small Cell Lung CA frozen section   PROCEDURE:  Procedure(s):  CORONARY ARTERY BYPASS GRAFTING x 3 -LIMA to LAD -SVG to OM  -SVG to RCA  ENDOSCOPIC HARVEST GREATER SAPHENOUS VEIN -Right Thigh  BIOPSY RIGHT HILAR MASS  BIOPSY PERICARDIAL LYMPH NODE  TRANSESOPHAGEAL ECHOCARDIOGRAM (TEE) (N/A)  SURGEON:  Surgeon(s) and Role:    * Grace Isaac, MD - Primary  PHYSICIAN ASSISTANT: Ellwood Handler PA-C  ANESTHESIA:   general  EBL:  600 mL   BLOOD ADMINISTERED: CELLSAVER  DRAINS: Left Pleural Chest Tubes, Mediastinal Chest Drains   LOCAL MEDICATIONS USED:  NONE  SPECIMEN:  Source of Specimen:  Pericardial Lymph Node, Biopsy Right Hilar Mass  DISPOSITION OF SPECIMEN:  PATHOLOGY  COUNTS:  YES  DICTATION: .Dragon Dictation  PLAN OF CARE: Admit to inpatient   PATIENT DISPOSITION:  ICU - intubated and hemodynamically stable.   Delay start of Pharmacological VTE agent (>24hrs) due to surgical blood loss or risk of bleeding: yes

## 2018-08-01 NOTE — Progress Notes (Signed)
Patient was placed back on full support due to that patient not having a positive cuff leak.  Will attempt to wean later per MD.    08/01/18 2117  Vent Select  Invasive or Noninvasive Invasive  Adult Vent Y  Vent start date 08/01/18  Vent start time 1345  Airway 8 mm  Placement Date/Time: 08/01/18 (c) 0748   Grade View: Grade 1  Airway Device: Endotracheal Tube  Laryngoscope Blade: MAC;4  ETT Types: Oral  Size (mm): 8 mm  Cuffed: Cuffed;Min.occ.pres.  Insertion attempts: 1  Airway Equipment: Stylet  Placement Confi...  Secured at (cm) 24 cm  Measured From [place] By [employer] Tube Holder  Tube Holder Repositioned Yes  Adult Ventilator Settings  Vent Type Servo i  Humidity HME  Vent Mode SIMV;PRVC;PSV  Vt Set 700 mL  Set Rate 12 bmp  FiO2 (%) 40 %  I Time 0.8 Sec(s)  Pressure Support 10 cmH20  PEEP 5 cmH20  Adult Ventilator Measurements  Peak Airway Pressure 18 L/min  Mean Airway Pressure 10 cmH20  Resp Rate Spontaneous 10 br/min  Resp Rate Total 22 br/min  Exhaled Vt 688 mL  Measured Ve 12.9 mL  SpO2 99 %  Adult Ventilator Alarms  Alarms On Y  Ve High Alarm 16 L/min  Ve Low Alarm 5 L/min  Resp Rate High Alarm 37 br/min  Resp Rate Low Alarm 8  PEEP Low Alarm 3 cmH2O  Press High Alarm 45 cmH2O  Breath Sounds  Bilateral Breath Sounds Clear;Diminished

## 2018-08-02 ENCOUNTER — Inpatient Hospital Stay (HOSPITAL_COMMUNITY): Payer: BLUE CROSS/BLUE SHIELD

## 2018-08-02 ENCOUNTER — Encounter (HOSPITAL_COMMUNITY): Payer: Self-pay | Admitting: Cardiothoracic Surgery

## 2018-08-02 LAB — GLUCOSE, CAPILLARY
GLUCOSE-CAPILLARY: 104 mg/dL — AB (ref 70–99)
GLUCOSE-CAPILLARY: 111 mg/dL — AB (ref 70–99)
GLUCOSE-CAPILLARY: 117 mg/dL — AB (ref 70–99)
GLUCOSE-CAPILLARY: 119 mg/dL — AB (ref 70–99)
GLUCOSE-CAPILLARY: 123 mg/dL — AB (ref 70–99)
GLUCOSE-CAPILLARY: 125 mg/dL — AB (ref 70–99)
GLUCOSE-CAPILLARY: 132 mg/dL — AB (ref 70–99)
GLUCOSE-CAPILLARY: 152 mg/dL — AB (ref 70–99)
Glucose-Capillary: 117 mg/dL — ABNORMAL HIGH (ref 70–99)
Glucose-Capillary: 124 mg/dL — ABNORMAL HIGH (ref 70–99)
Glucose-Capillary: 85 mg/dL (ref 70–99)
Glucose-Capillary: 169 mg/dL — ABNORMAL HIGH (ref 70–99)
Glucose-Capillary: 154 mg/dL — ABNORMAL HIGH (ref 70–99)

## 2018-08-02 LAB — BASIC METABOLIC PANEL
ANION GAP: 7 (ref 5–15)
BUN: 14 mg/dL (ref 8–23)
CHLORIDE: 99 mmol/L (ref 98–111)
CO2: 22 mmol/L (ref 22–32)
Calcium: 8.1 mg/dL — ABNORMAL LOW (ref 8.9–10.3)
Creatinine, Ser: 1.33 mg/dL — ABNORMAL HIGH (ref 0.61–1.24)
GFR calc non Af Amer: 50 mL/min — ABNORMAL LOW (ref >60)
GFR, EST AFRICAN AMERICAN: 58 mL/min — AB (ref >60)
Glucose, Bld: 107 mg/dL — ABNORMAL HIGH (ref 70–99)
Potassium: 4.9 mmol/L (ref 3.5–5.1)
Sodium: 128 mmol/L — ABNORMAL LOW (ref 135–145)

## 2018-08-02 LAB — POCT I-STAT 3, ART BLOOD GAS (G3+)
ACID-BASE DEFICIT: 2 mmol/L (ref 0.0–2.0)
Acid-base deficit: 1 mmol/L (ref 0.0–2.0)
BICARBONATE: 22.2 mmol/L (ref 20.0–28.0)
BICARBONATE: 22.9 mmol/L (ref 20.0–28.0)
O2 SAT: 97 %
O2 Saturation: 97 %
PO2 ART: 92 mmHg (ref 83.0–108.0)
PO2 ART: 95 mmHg (ref 83.0–108.0)
Patient temperature: 37.2
Patient temperature: 37.3
TCO2: 23 mmol/L (ref 22–32)
TCO2: 24 mmol/L (ref 22–32)
pCO2 arterial: 36.2 mmHg (ref 32.0–48.0)
pCO2 arterial: 37 mmHg (ref 32.0–48.0)
pH, Arterial: 7.386 (ref 7.350–7.450)
pH, Arterial: 7.411 (ref 7.350–7.450)

## 2018-08-02 LAB — CBC
HCT: 30.9 % — ABNORMAL LOW (ref 39.0–52.0)
HEMATOCRIT: 32.1 % — AB (ref 39.0–52.0)
HEMOGLOBIN: 10.2 g/dL — AB (ref 13.0–17.0)
Hemoglobin: 10.6 g/dL — ABNORMAL LOW (ref 13.0–17.0)
MCH: 29.6 pg (ref 26.0–34.0)
MCH: 29.6 pg (ref 26.0–34.0)
MCHC: 33 g/dL (ref 30.0–36.0)
MCHC: 33 g/dL (ref 30.0–36.0)
MCV: 89.7 fL (ref 78.0–100.0)
MCV: 89.6 fL (ref 78.0–100.0)
Platelets: 176 10*3/uL (ref 150–400)
Platelets: 182 10*3/uL (ref 150–400)
RBC: 3.58 MIL/uL — AB (ref 4.22–5.81)
RBC: 3.45 MIL/uL — ABNORMAL LOW (ref 4.22–5.81)
RDW: 13.2 % (ref 11.5–15.5)
RDW: 13.2 % (ref 11.5–15.5)
WBC: 11.8 10*3/uL — ABNORMAL HIGH (ref 4.0–10.5)
WBC: 15.7 10*3/uL — AB (ref 4.0–10.5)

## 2018-08-02 LAB — CREATININE, SERUM
Creatinine, Ser: 1.37 mg/dL — ABNORMAL HIGH (ref 0.61–1.24)
GFR, EST AFRICAN AMERICAN: 56 mL/min — AB (ref >60)
GFR, EST NON AFRICAN AMERICAN: 48 mL/min — AB (ref >60)

## 2018-08-02 LAB — TSH: TSH: 0.532 u[IU]/mL (ref 0.350–4.500)

## 2018-08-02 LAB — POCT I-STAT, CHEM 8
BUN: 17 mg/dL (ref 8–23)
CHLORIDE: 95 mmol/L — AB (ref 98–111)
CREATININE: 1.3 mg/dL — AB (ref 0.61–1.24)
Calcium, Ion: 1.16 mmol/L (ref 1.15–1.40)
GLUCOSE: 167 mg/dL — AB (ref 70–99)
HCT: 30 % — ABNORMAL LOW (ref 39.0–52.0)
Hemoglobin: 10.2 g/dL — ABNORMAL LOW (ref 13.0–17.0)
POTASSIUM: 4.6 mmol/L (ref 3.5–5.1)
Sodium: 127 mmol/L — ABNORMAL LOW (ref 135–145)
TCO2: 22 mmol/L (ref 22–32)

## 2018-08-02 LAB — COOXEMETRY PANEL
Carboxyhemoglobin: 1.4 % (ref 0.5–1.5)
Methemoglobin: 1.2 % (ref 0.0–1.5)
O2 Saturation: 66.5 %
Total hemoglobin: 11 g/dL — ABNORMAL LOW (ref 12.0–16.0)

## 2018-08-02 LAB — MAGNESIUM
MAGNESIUM: 2.5 mg/dL — AB (ref 1.7–2.4)
Magnesium: 2.4 mg/dL (ref 1.7–2.4)

## 2018-08-02 MED ORDER — AMIODARONE HCL 200 MG PO TABS
400.0000 mg | ORAL_TABLET | Freq: Two times a day (BID) | ORAL | Status: DC
  Administered 2018-08-04 – 2018-08-06 (×5): 400 mg via ORAL
  Filled 2018-08-02 (×6): qty 2

## 2018-08-02 MED ORDER — AMIODARONE LOAD VIA INFUSION
150.0000 mg | Freq: Once | INTRAVENOUS | Status: AC
  Administered 2018-08-02: 150 mg via INTRAVENOUS
  Filled 2018-08-02: qty 83.34

## 2018-08-02 MED ORDER — ENOXAPARIN SODIUM 30 MG/0.3ML ~~LOC~~ SOLN
30.0000 mg | Freq: Every day | SUBCUTANEOUS | Status: DC
  Administered 2018-08-02 – 2018-08-04 (×3): 30 mg via SUBCUTANEOUS
  Filled 2018-08-02 (×3): qty 0.3

## 2018-08-02 MED ORDER — FUROSEMIDE 10 MG/ML IJ SOLN
20.0000 mg | Freq: Once | INTRAMUSCULAR | Status: AC
  Administered 2018-08-02: 20 mg via INTRAVENOUS
  Filled 2018-08-02: qty 2

## 2018-08-02 MED ORDER — ORAL CARE MOUTH RINSE
15.0000 mL | Freq: Two times a day (BID) | OROMUCOSAL | Status: DC
  Administered 2018-08-02 – 2018-08-05 (×3): 15 mL via OROMUCOSAL

## 2018-08-02 MED ORDER — INSULIN DETEMIR 100 UNIT/ML ~~LOC~~ SOLN
10.0000 [IU] | Freq: Once | SUBCUTANEOUS | Status: AC
  Administered 2018-08-02: 10 [IU] via SUBCUTANEOUS
  Filled 2018-08-02: qty 0.1

## 2018-08-02 MED ORDER — AMIODARONE HCL IN DEXTROSE 360-4.14 MG/200ML-% IV SOLN
30.0000 mg/h | INTRAVENOUS | Status: DC
  Administered 2018-08-03 – 2018-08-04 (×3): 30 mg/h via INTRAVENOUS
  Filled 2018-08-02 (×5): qty 200

## 2018-08-02 MED ORDER — AMIODARONE HCL 200 MG PO TABS: 400.0000 mg | ORAL_TABLET | Freq: Every day | ORAL | Status: DC

## 2018-08-02 MED ORDER — INSULIN DETEMIR 100 UNIT/ML ~~LOC~~ SOLN
10.0000 [IU] | Freq: Every day | SUBCUTANEOUS | Status: DC
  Administered 2018-08-03 – 2018-08-06 (×4): 10 [IU] via SUBCUTANEOUS
  Filled 2018-08-02 (×4): qty 0.1

## 2018-08-02 MED ORDER — INSULIN ASPART 100 UNIT/ML ~~LOC~~ SOLN
0.0000 [IU] | SUBCUTANEOUS | Status: DC
  Administered 2018-08-02: 4 [IU] via SUBCUTANEOUS
  Administered 2018-08-02 – 2018-08-04 (×7): 2 [IU] via SUBCUTANEOUS

## 2018-08-02 MED ORDER — AMIODARONE HCL IN DEXTROSE 360-4.14 MG/200ML-% IV SOLN
60.0000 mg/h | INTRAVENOUS | Status: DC
  Administered 2018-08-02 (×2): 60 mg/h via INTRAVENOUS
  Filled 2018-08-02: qty 200

## 2018-08-02 NOTE — Progress Notes (Signed)
Patient ID: Gregory Bates, male   DOB: 12/14/1940, 77 y.o.   MRN: [id number] TCTS DAILY ICU PROGRESS NOTE                   [address]            [place] [address]          [phone]   1 Day Post-Op Procedure(s) (LRB): CORONARY ARTERY BYPASS GRAFTING (CABG) TIMES THREE USING LEFT INTERNAL MAMMARY ARTERY TO LAD, RIGHT GREATER SAPHENOUS VEIN GRAFT TO OM AND RCA, VEIN HARVESTED ENDOSCOPICALLY (N/A) TRANSESOPHAGEAL ECHOCARDIOGRAM (TEE) (N/A) CLIPPING OF ATRIAL APPENDAGE (Left) BIOPSY OF PERICARDIAL LYMPH NODE AND HILAR LYMPH NODE  Total Length of Stay:  LOS: 6 days   Subjective: Patient awake alert neurologically intact extubated about midnight last night.   Objective: Vital signs in last 24 hours: Temp:  [96.4 F (35.8 C)-99.5 F (37.5 C)] 98.4 F (36.9 C) (10/01 0600) Pulse Rate:  [88-107] 107 (10/01 0600) Cardiac Rhythm: Normal sinus rhythm;Sinus tachycardia (10/01 0400) Resp:  [12-25] 19 (10/01 0600) BP: (108-152)/(42-71) 149/67 (10/01 0500) SpO2:  [85 %-100 %] 98 % (10/01 0600) Arterial Line BP: (107-158)/(34-61) 131/61 (10/01 0600) FiO2 (%):  [40 %-50 %] 40 % (09/30 2330) Weight:  [88.1 kg] 88.1 kg (10/01 0500)  Filed Weights   07/31/18 0318 08/01/18 0600 08/02/18 0500  Weight: 85.3 kg 84.7 kg 88.1 kg    Weight change: 3.4 kg   Hemodynamic parameters for last 24 hours: PAP: (23-36)/(11-20) 26/14 CO:  [4.3 L/min-6.5 L/min] 6.4 L/min CI:  [2.1 L/min/m2-3.2 L/min/m2] 3.1 L/min/m2  Intake/Output from previous day: 09/30 0701 - 10/01 0700 In: 5092.8 [I.V.:4071.8; Blood:275; IV Piggyback:745.9] Out: 9169 [Urine:3025; Blood:600; Chest Tube:320]  Intake/Output this shift: Total I/O In: 577 [I.V.:572.6; IV Piggyback:4.4] Out: 1510 [Urine:1365; Chest Tube:145]  Current Meds: Scheduled Meds: . acetaminophen  1,000 mg Oral Q6H   Or  . acetaminophen (TYLENOL) oral liquid 160 mg/5 mL  1,000 mg Per Tube Q6H  . aspirin EC  325 mg Oral Daily   Or    . aspirin  324 mg Per Tube Daily  . atorvastatin  20 mg Oral q1800  . bisacodyl  10 mg Oral Daily   Or  . bisacodyl  10 mg Rectal Daily  . chlorhexidine gluconate (MEDLINE KIT)  15 mL Mouth Rinse BID  . Chlorhexidine Gluconate Cloth  6 each Topical Daily  . docusate sodium  200 mg Oral Daily  . fluticasone  2 spray Each Nare BID  . insulin regular  0-10 Units Intravenous TID WC  . levalbuterol  0.63 mg Nebulization Q6H  . loratadine  10 mg Oral Daily  . mouth rinse  15 mL Mouth Rinse 10 times per day  . metoprolol tartrate  12.5 mg Oral BID   Or  . metoprolol tartrate  12.5 mg Per Tube BID  . mupirocin ointment  1 application Nasal BID  . [START ON 08/03/2018] pantoprazole  40 mg Oral Daily  . sodium chloride flush  10-40 mL Intracatheter Q12H  . sodium chloride flush  3 mL Intravenous Q12H   Continuous Infusions: . sodium chloride 20 mL/hr at 08/02/18 0600  . sodium chloride    . sodium chloride 10 mL/hr at 08/01/18 1346  . albumin human 12.5 g (08/01/18 1848)  . cefUROXime (ZINACEF)  IV 1.5 g (08/02/18 0410)  . dexmedetomidine (PRECEDEX) IV infusion Stopped (08/01/18 2306)  . DOPamine 5 mcg/kg/min (08/02/18 0600)  . famotidine (PEPCID)  IV 20 mg (08/01/18 2137)  . insulin (NOVOLIN-R) infusion 2.9 mL/hr at 08/02/18 0600  . lactated ringers    . lactated ringers    . lactated ringers 20 mL/hr at 08/02/18 0600  . nitroGLYCERIN 40 mcg/min (08/02/18 0600)  . phenylephrine (NEO-SYNEPHRINE) Adult infusion Stopped (08/01/18 1942)   PRN Meds:.sodium chloride, albumin human, lactated ringers, metoprolol tartrate, midazolam, morphine injection, ondansetron (ZOFRAN) IV, oxyCODONE, sodium chloride flush, sodium chloride flush, traMADol  General appearance: alert, cooperative and no distress Neurologic: intact Heart: regular rate and rhythm, S1, S2 normal, no murmur, click, rub or gallop Lungs: rhonchi bilaterally Abdomen: soft, non-tender; bowel sounds normal; no masses,  no  organomegaly Extremities: extremities normal, atraumatic, no cyanosis or edema and Homans sign is negative, no sign of DVT Wound: Sternum intact dressing intact 20  Lab Results: CBC: Recent Labs    08/01/18 1947 08/01/18 1952 08/02/18 0401  WBC 13.9*  --  11.8*  HGB 10.7* 10.5* 10.6*  HCT 32.6* 31.0* 32.1*  PLT 157  --  176   BMET:  Recent Labs    08/01/18 0259  08/01/18 1952 08/02/18 0401  NA 128*   < > 129* 128*  K 4.7   < > 4.9 4.9  CL 96*   < > 97* 99  CO2 24  --   --  22  GLUCOSE 118*   < > 118* 107*  BUN 16   < > 14 14  CREATININE 1.28*   < > 1.20 1.33*  CALCIUM 8.6*  --   --  8.1*   < > = values in this interval not displayed.    CMET: Lab Results  Component Value Date   WBC 11.8 (H) 08/02/2018   HGB 10.6 (L) 08/02/2018   HCT 32.1 (L) 08/02/2018   PLT 176 08/02/2018   GLUCOSE 107 (H) 08/02/2018   CHOL 170 11/04/2013   TRIG 166 (H) 11/04/2013   HDL 34 (L) 11/04/2013   LDLDIRECT 69.3 11/26/2008   LDLCALC 103 (H) 11/04/2013   ALT 50 (H) 07/30/2018   AST 39 07/30/2018   NA 128 (L) 08/02/2018   K 4.9 08/02/2018   CL 99 08/02/2018   CREATININE 1.33 (H) 08/02/2018   BUN 14 08/02/2018   CO2 22 08/02/2018   TSH 0.628 10/15/2017   PSA 1.45 11/26/2008   INR 1.35 08/01/2018   HGBA1C 6.4 (H) 07/31/2018   MICROALBUR 1.5 08/21/2013      PT/INR:  Recent Labs    08/01/18 1354  LABPROT 16.6*  INR 1.35   Radiology: Dg Chest Port 1 View  Result Date: 08/01/2018 CLINICAL DATA:  Status post CABG EXAM: PORTABLE CHEST 1 VIEW COMPARISON:  07/30/2018 FINDINGS: ET tube tip is above the carina. Enteric tube tip is below the field of view. There is an IJ catheter with tip looped in the right ventricular outflow tract. Mediastinal drain and left chest tubes identified. No pneumothorax identified. Mild edema identified within the right base. No airspace densities identified. Right perihilar and right paratracheal lung mass again noted. IMPRESSION: 1. Postoperative  changes from CABG procedure. No complications identified. 2. Right perihilar and right paratracheal lung mass as before. Electronically Signed   By: Kerby Moors M.D.   On: 08/01/2018 14:25     Assessment/Plan: S/P Procedure(s) (LRB): CORONARY ARTERY BYPASS GRAFTING (CABG) TIMES THREE USING LEFT INTERNAL MAMMARY ARTERY TO LAD, RIGHT GREATER SAPHENOUS VEIN GRAFT TO OM AND RCA, VEIN HARVESTED ENDOSCOPICALLY (N/A) TRANSESOPHAGEAL ECHOCARDIOGRAM (TEE) (N/A) CLIPPING OF ATRIAL APPENDAGE (  Left) BIOPSY OF PERICARDIAL LYMPH NODE AND HILAR LYMPH NODE Mobilize Diuresis Diabetes control d/c tubes/lines See progression orders Expected Acute  Blood - loss Anemia- continue to monitor  Monitor for hyponatremia, history of hyponatremia for the past 8 months likely related to underlying small cell carcinoma of the lung Renal function stable, with preop stage III chronic renal disease    Grace Isaac 08/02/2018 6:43 AM

## 2018-08-02 NOTE — Discharge Summary (Signed)
Physician Discharge Summary  Patient ID: Gregory Bates MRN: [id number] DOB/AGE: 1941-08-11 77 y.o.  Admit date: 07/25/2018 Discharge date: 08/06/2018  Admission Diagnoses: Patient Active Problem List   Diagnosis Date Noted  . PAF (paroxysmal atrial fibrillation) ([place]) 07/26/2018  . CKD (chronic kidney disease), stage III ([place]) 07/26/2018  . Abnormal CXR 07/26/2018  . Normochromic normocytic anemia   . Hyponatremia 10/15/2017  . Nausea & vomiting 10/15/2017  . Chronic diastolic CHF (congestive heart failure) ([place]) 12/23/2015  . Exertional angina (HCC) 11/21/2014  . Unstable angina ([place]) 12/26/2013  . Atrial flutter ([place]) 11/03/2013  . CAD (coronary artery disease) 10/02/2012  . Type 2 diabetes mellitus with vascular disease ([place]) 09/18/2010  . Essential hypertension 03/28/2010  . Normocytic anemia 12/04/2008  . COLONIC POLYPS, RECURRENT 04/26/2008  . HYPERLIPIDEMIA 10/07/2007  . ERECTILE DYSFUNCTION 10/07/2007    Discharge Diagnoses:  Principal Problem:   Unstable angina ([place]) Active Problems:   Type 2 diabetes mellitus with vascular disease (HCC)   Normocytic anemia   Essential hypertension   CAD (coronary artery disease)   Chronic diastolic CHF (congestive heart failure) (HCC)   PAF (paroxysmal atrial fibrillation) (HCC)   CKD (chronic kidney disease), stage III (HCC)   Abnormal CXR   S/P CABG x 3   Small cell lung cancer in adult [facility])   Discharged Condition: good  HPI:   This is a 77 year old Caucasian male with a past medical history of coronary artery disease (s/p STEMI 2013, NSTEMI 2014, PCI with stents) hypertension, hyperlipidemia, CKD (stage III), paroxysmal atrial flutter, diabetes mellitus (type II) who presented to [facility] ED on 07/26/2018 with complaints of chest pain and some shortness of breath which apparently woke him from sleep. He took two Nitroglycerin and chest pain resolved. He denied pain radiation or nausea. He apparently had similar symptoms  the previus day but they occurred while he was awake. He did state that he has had progressive shortness of breath with exertion over the past month. EKG showed sinus rhythm without ST elevation or depression. Peak Troponin I was 0.04. He underwent a cardiac catheterization today and was found to have significant 3 vessel coronary artery disease (80% left main disease included). He is on a Heparin drip. He last took Plavix yesterday. A cardiothoracic consultation has been obtained for the consideration of coronary artery bypass grafting surgery. He denies chest pain or shortness of breath. Vital signs are stable.   Hospital Course:  On 08/01/2018 Gregory Bates underwent a coronary bypass grafting x3 with Dr. Servando Snare.  He tolerated procedure well and was transferred to the surgical ICU.  He was extubated at midnight and remained neurologically intact following morning.  We worked on discontinuing his chest tubes and lines.  His renal function remained stable considering he did have preop stage III chronic renal disease.  He did show signs of hyponatremia which has been present for the last several months and is related to underlying small cell carcinoma of the lung. The aforementioned was dictated by Nicholes Rough PA-C. He was felt surgically stable for transfer from the ICU to 4E for further convalescence on 10/03. He is ambulating on room air. He is tolerating a diet and had a bowel movement. His wounds are clean and dry and continuing to heal. He has a history of PAF and has been in a fib and rate not well controlled yet. As of 10/04, he is on Amiodarone 400 mg bid and Lopressor was increased to 25 mg bid.  His a fib rate is now better controlled.  Eliquis was restarted asepicardial pacing wires are removed on 10/03. He was in sinus rhythm the morning of 10/05. He has had hyponatremia (which he has had in the past and apparently was given salt tablets by his medical doctor), which may be related to newly diagnosed  lung cancer. As discussed with Dr. Cyndia Bent, patient was given one dose of Tolvaptan 15 mg on 10/05. He was instructed to restrict fluid (not severely) and obtain a BMET on Tuesday 10/08 (with results faxed to [place] office). He was also instructed to follow up with his medical doctor for further treatment of hyponatremia. In addition, Dr. Servando Snare will arrange for PET and oncology follow up after discharge. He is felt surgically stable for discharge today.  Consults: cardiology  Significant Diagnostic Studies:  CLINICAL DATA:  Chest tube present, follow-up  EXAM: PORTABLE CHEST 1 VIEW  COMPARISON:  Chest x-ray of 08/01/2017  FINDINGS: The endotracheal tube and NG tube have been removed. A left chest tube remains and no pneumothorax is noted. There is mild basilar volume loss. Right Swan-Ganz catheter is coiled in main pulmonary artery. Cardiomegaly is stable. There may be very mild pulmonary vascular congestion present and small effusions cannot be excluded. Median sternotomy sutures are present  IMPRESSION: 1. Endotracheal tube and NG tube removed. 2. Little change in aeration with mild basilar volume loss and possible small effusions. 3. Left chest tube remains with no pneumothorax. 4. Little change in cardiomegaly and possible minimal pulmonary vascular congestion.   Electronically Signed   By: Ivar Drape M.D.   On: 08/02/2018 08:15  Treatments:   NAME: RANDEL, HARGENS MEDICAL RECORD XT:[id number] ACCOUNT [id number] DATE OF BIRTH:[birth year] FACILITY: [facility] LOCATION: [facility] PHYSICIAN:EDWARD Maryruth Bun, MD  OPERATIVE REPORT  DATE OF PROCEDURE:  08/01/2018  PREOPERATIVE DIAGNOSES:   1.  Coronary artery bypass grafting 2.  Right hilar mass.  POSTOPERATIVE DIAGNOSES:   1.  Coronary artery bypass grafting 2.  Right hilar mass with quick stain.   3.  Positive small cell carcinoma of the lung.  PROCEDURE PERFORMED:   1.  Coronary artery bypass grafting x3  with left internal mammary to the left anterior descending coronary artery, reverse saphenous vein graft to the obtuse marginal, reverse saphenous vein graft to the posterior descending coronary artery with right  greater saphenous endoscopic vein harvesting.  Placement of AtriCure left atrial clip 50 mm.   2.  Biopsy of the intrapericardial lymph node and biopsy of right hilar mass.  SURGEON:  Lanelle Bal, MD  FIRST ASSISTANT:  Ellwood Handler, PA-C  Discharge Exam: Blood pressure 117/88, pulse 85, temperature 97.9 F (36.6 C), temperature source Oral, resp. rate 19, height 5\' 11"  (1.803 m), weight 86.7 kg, SpO2 97 %.  Cardiovascular: RRR Pulmonary: Slightly diminished at bases Abdomen: Soft, non tender, bowel sounds present. Extremities: Trace RLE edema. Wounds: Clean and dry.  No erythema or signs of infection.   Disposition: Discharge disposition: 01-Home or Self Care     Stable and discharged to home.  Discharge Instructions    AMB Referral to [place] Management   Complete by:  As directed    Please assign to [place] for complex case management. Written consent obtained. PCP ([place]) listed as doing toc. Multiple co-morbities. Currently at [facility]. Please call with questions. Thanks. Marthenia Rolling, MSN-Ed, RN,BSN [facility] [phone]   Reason for consult:  Please assign to [place]  Diagnoses of:   Diabetes Heart Failure     Expected date of contact:  1-3 days (reserved for hospital discharges)   Amb Referral to Cardiac Rehabilitation   Complete by:  As directed    Will send CRP II referral to [place]   Diagnosis:  CABG   CABG X ___:  3     Allergies as of 08/06/2018      Reactions   Brilinta [ticagrelor] Other (See Comments)   Arthralgias & myalgias   Crestor [rosuvastatin] Other (See Comments)   Myalgias      Medication List    STOP taking these medications   amLODipine 5 MG  tablet Commonly known as:  NORVASC   clopidogrel 75 MG tablet Commonly known as:  PLAVIX   furosemide 20 MG tablet Commonly known as:  LASIX   isosorbide mononitrate 60 MG 24 hr tablet Commonly known as:  IMDUR   lisinopril 20 MG tablet Commonly known as:  PRINIVIL,ZESTRIL   nitroGLYCERIN 0.4 MG SL tablet Commonly known as:  NITROSTAT     TAKE these medications   acetaminophen 325 MG tablet Commonly known as:  TYLENOL Take 2 tablets (650 mg total) by mouth every 6 (six) hours as needed for mild pain.   amiodarone 200 MG tablet Commonly known as:  PACERONE Take 1 tablet (200 mg total) by mouth 2 (two) times daily. For one week;then take 200 mg daily thereafter What changed:    when to take this  additional instructions   aspirin 81 MG EC tablet Take 1 tablet (81 mg total) by mouth daily.   atorvastatin 20 MG tablet Commonly known as:  LIPITOR Take 1 tablet (20 mg total) by mouth daily.   cholecalciferol 1000 units tablet Commonly known as:  VITAMIN D Take 1,000 Units by mouth every evening.   ELIQUIS 5 MG Tabs tablet Generic drug:  apixaban TAKE 1 TABLET BY MOUTH TWICE DAILY What changed:  how much to take   fexofenadine 180 MG tablet Commonly known as:  ALLEGRA Take 180 mg by mouth daily as needed for allergies.   guaiFENesin 600 MG 12 hr tablet Commonly known as:  MUCINEX Take 1 tablet (600 mg total) by mouth 2 (two) times daily as needed for cough or to loosen phlegm.   metFORMIN 500 MG tablet Commonly known as:  GLUCOPHAGE Take 500 mg by mouth daily.   metoprolol tartrate 25 MG tablet Commonly known as:  LOPRESSOR Take 1 tablet (25 mg total) by mouth 2 (two) times daily.   multivitamin tablet Take 1 tablet by mouth at bedtime.   traMADol 50 MG tablet Commonly known as:  ULTRAM Take 50 mg by mouth every 4-6 hours PRN severe pain   vitamin E 100 UNIT capsule Take 100 Units by mouth at bedtime.   The patient has been discharged  on:   1.Beta Blocker:  Yes [ x  ]                              No   [   ]                              If No, reason:  2.Ace Inhibitor/ARB: Yes [   ]  No  [  x  ]                                     If No, reason:Labile BP 3.Statin:   Yes [x  ]                  No  [   ]                  If No, reason:  4.Ecasa:  Yes  [ x ]                  No   [   ]                  If No, reason:         Durable Medical Equipment  (From admission, onward)         Start     Ordered   08/05/18 1544  For home use only DME 4 wheeled rolling walker with seat  Once    Question:  Patient needs a walker to treat with the following condition  Answer:  Weakness generalized   08/05/18 1543   08/05/18 1535  For home use only DME 3 n 1  Once     08/05/18 1543         Follow-up Browns, Sharon Mt, MD. Call.   Specialty:  Family Medicine Why:  for a follow up appointment regarding further surveillance of HGA1C 6.4 and monitoring of hyponatremia Contact information: [place] [address] [phone]        Richardson Dopp T, [place]. Go on 08/24/2018.   Specialties:  Cardiology, Physician Assistant Why:  Appointment time is at 3:15 am Contact information: [address] [place] [address] [phone]        Grace Isaac, MD. Go on 09/05/2018.   Specialty:  Cardiothoracic Surgery Why:  Appointment is with Physician Assistant. PA/LAT CXR to be taken (at [facility] which is in the same building as Dr. Everrett Coombe office) on 09/05/2018 at 1:00 pm;Appointment time is at 1:30 pm Contact information: [place] [address] [phone]        Blood draw Follow up.   Why:  Please get a BMET on Tuesday 10/08 as we need to monitor sodium (has had hyponatremia)           Signed: Nani Skillern PA-C 08/06/2018, 10:30 AM

## 2018-08-02 NOTE — Plan of Care (Signed)
  Problem: Health Behavior/Discharge Planning: Goal: Ability to manage health-related needs will improve Outcome: Progressing   Problem: Clinical Measurements: Goal: Ability to maintain clinical measurements within normal limits will improve Outcome: Progressing Goal: Will remain free from infection Outcome: Progressing   Problem: Activity: Goal: Risk for activity intolerance will decrease Outcome: Progressing   Problem: Nutrition: Goal: Adequate nutrition will be maintained Outcome: Progressing   Problem: Coping: Goal: Level of anxiety will decrease Outcome: Progressing   Problem: Elimination: Goal: Will not experience complications related to bowel motility Outcome: Progressing

## 2018-08-02 NOTE — Progress Notes (Addendum)
  Amiodarone Drug - Drug Interaction Consult Note  Recommendations: -Watch HR closely with metoprolol  Amiodarone is metabolized by the cytochrome P450 system and therefore has the potential to cause many drug interactions. Amiodarone has an average plasma half-life of 50 days (range 20 to 100 days).   There is potential for drug interactions to occur several weeks or months after stopping treatment and the onset of drug interactions may be slow after initiating amiodarone.   []  Statins: Increased risk of myopathy. Simvastatin- restrict dose to 20mg  daily. Other statins: counsel patients to report any muscle pain or weakness immediately.  []  Anticoagulants: Amiodarone can increase anticoagulant effect. Consider warfarin dose reduction. Patients should be monitored closely and the dose of anticoagulant altered accordingly, remembering that amiodarone levels take several weeks to stabilize.  []  Antiepileptics: Amiodarone can increase plasma concentration of phenytoin, the dose should be reduced. Note that small changes in phenytoin dose can result in large changes in levels. Monitor patient and counsel on signs of toxicity.  [x]  Beta blockers: increased risk of bradycardia, AV block and myocardial depression. Sotalol - avoid concomitant use.  []   Calcium channel blockers (diltiazem and verapamil): increased risk of bradycardia, AV block and myocardial depression.  []   Cyclosporine: Amiodarone increases levels of cyclosporine. Reduced dose of cyclosporine is recommended.  []  Digoxin dose should be halved when amiodarone is started.  []  Diuretics: increased risk of cardiotoxicity if hypokalemia occurs.  []  Oral hypoglycemic agents (glyburide, glipizide, glimepiride): increased risk of hypoglycemia. Patient's glucose levels should be monitored closely when initiating amiodarone therapy.   []  Drugs that prolong the QT interval:  Torsades de pointes risk may be increased with concurrent use -  avoid if possible.  Monitor QTc, also keep magnesium/potassium WNL if concurrent therapy can't be avoided. [name] Kitchen Antibiotics: e.g. fluoroquinolones, erythromycin. . Antiarrhythmics: e.g. quinidine, procainamide, disopyramide, sotalol. . Antipsychotics: e.g. phenothiazines, haloperidol.  . Lithium, tricyclic antidepressants, and methadone. Thank Concha Pyo  08/02/2018 2:48 PM

## 2018-08-02 NOTE — Progress Notes (Signed)
Patient ID: RUAL VERMEER, male   DOB: 09/13/1941, 77 y.o.   MRN: [id number] EVENING ROUNDS NOTE :     [address]       [place], [id number]             [phone]                 1 Day Post-Op Procedure(s) (LRB): CORONARY ARTERY BYPASS GRAFTING (CABG) TIMES THREE USING LEFT INTERNAL MAMMARY ARTERY TO LAD, RIGHT GREATER SAPHENOUS VEIN GRAFT TO OM AND RCA, VEIN HARVESTED ENDOSCOPICALLY (N/A) TRANSESOPHAGEAL ECHOCARDIOGRAM (TEE) (N/A) CLIPPING OF ATRIAL APPENDAGE (Left) BIOPSY OF PERICARDIAL LYMPH NODE AND HILAR LYMPH NODE  Total Length of Stay:  LOS: 6 days  BP (!) 103/58   Pulse (!) 112   Temp 98.4 F (36.9 C)   Resp 15   Ht 5\' 11"  (1.803 m)   Wt 88.1 kg   SpO2 96%   BMI 27.09 kg/m   .Intake/Output      09/30 0701 - 10/01 0700 10/01 0701 - 10/02 0700   P.O. 0 240   I.V. (mL/kg) 4136.4 (47) 170.6 (1.9)   Blood 275    IV Piggyback 745.9    Total Intake(mL/kg) 5157.3 (58.5) 410.6 (4.7)   Urine (mL/kg/hr) 3185 (1.5) 995 (1)   Stool     Blood 600    Chest Tube 320 30   Total Output 4105 1025   Net +1052.3 -614.4          . sodium chloride Stopped (08/02/18 0819)  . sodium chloride    . sodium chloride 10 mL/hr at 08/01/18 1346  . amiodarone 60 mg/hr (08/02/18 1804)   Followed by  . amiodarone    . cefUROXime (ZINACEF)  IV 1.5 g (08/02/18 1623)  . dexmedetomidine (PRECEDEX) IV infusion Stopped (08/01/18 2306)  . lactated ringers    . lactated ringers    . lactated ringers 20 mL/hr at 08/02/18 1100  . nitroGLYCERIN Stopped (08/02/18 1015)  . phenylephrine (NEO-SYNEPHRINE) Adult infusion Stopped (08/01/18 1942)     Lab Results  Component Value Date   WBC 15.7 (H) 08/02/2018   HGB 10.2 (L) 08/02/2018   HCT 30.9 (L) 08/02/2018   PLT 182 08/02/2018   GLUCOSE 167 (H) 08/02/2018   CHOL 170 11/04/2013   TRIG 166 (H) 11/04/2013   HDL 34 (L) 11/04/2013   LDLDIRECT 69.3 11/26/2008   LDLCALC 103 (H) 11/04/2013   ALT 50 (H) 07/30/2018   AST 39  07/30/2018   NA 127 (L) 08/02/2018   K 4.6 08/02/2018   CL 95 (L) 08/02/2018   CREATININE 1.30 (H) 08/02/2018   BUN 17 08/02/2018   CO2 22 08/02/2018   TSH 0.532 08/02/2018   PSA 1.45 11/26/2008   INR 1.35 08/01/2018   HGBA1C 6.4 (H) 07/31/2018   MICROALBUR 1.5 08/21/2013   Developed afib today started on iv Cordarone Up in chair , rate slowed still in afib Discussed Lung cancer finding with patient    Grace Isaac MD  [place] Office [phone] 08/02/2018 6:20 PM

## 2018-08-02 NOTE — Procedures (Signed)
Extubation Procedure Note  Patient Details:   Name: TREA LATNER DOB: Jun 29, 1941 MRN: [id number]   Airway Documentation:  Airway 8 mm (Active)  Secured at (cm) 24 cm 08/01/2018 11:30 PM  Measured From Lips 08/01/2018 11:30 PM  [place] 08/01/2018 11:30 PM  Secured By [employer] 08/01/2018 11:30 PM  Tube Holder Repositioned Yes 08/01/2018  9:17 PM  Cuff Pressure (cm H2O) 28 cm H2O 08/01/2018  3:13 PM  Site Condition Dry 08/01/2018  8:00 PM   Vent end date: (not recorded) Vent end time: (not recorded)   Evaluation  O2 sats: stable throughout Complications: No apparent complications Patient did tolerate procedure well. Bilateral Breath Sounds: Clear, Diminished   Yes   Patient performed VC 1.2 L and NIF -40.  Patient had positive cuff leak and was extubated to 4 L [place].  Patient was able to perform IS with very good effort at 750 mL.   Carrington Clamp A 08/02/2018, 12:13 AM

## 2018-08-02 NOTE — Op Note (Signed)
NAME: Gregory Bates, Gregory Bates MEDICAL RECORD WU:[id number] ACCOUNT [id number] DATE OF BIRTH:Aug 31, 1941 FACILITY: [facility] LOCATION: [facility] PHYSICIAN:Dayson Aboud Maryruth Bun, MD  OPERATIVE REPORT  DATE OF PROCEDURE:  08/01/2018  PREOPERATIVE DIAGNOSES:   1.  Coronary artery bypass grafting 2.  Right hilar mass.  POSTOPERATIVE DIAGNOSES:   1.  Coronary artery bypass grafting 2.  Right hilar mass with quick stain.   3.  Positive small cell carcinoma of the lung.  PROCEDURE PERFORMED:   1.  Coronary artery bypass grafting x3 with left internal mammary to the left anterior descending coronary artery, reverse saphenous vein graft to the obtuse marginal, reverse saphenous vein graft to the posterior descending coronary artery with right  greater saphenous endoscopic vein harvesting.  Placement of AtriCure left atrial clip 50 mm.   2.  Biopsy of the intrapericardial lymph node and biopsy of right hilar mass.  SURGEON:  Lanelle Bal, MD  FIRST ASSISTANT:  Ellwood Handler, PA-C  BRIEF HISTORY:  The patient is a 77 year old male well known to the cardiology service having had multiple stents over the past 10 years.  The patient presented to the [facility] mergency room the week prior to surgery on Sunday evening with chest pain.  He  was discharged home, went to work on Monday and came back Monday night with continued chest pain and was admitted.  Cardiac catheterization was performed which demonstrated complex distal left main disease, not amenable to angioplasty, patent stents in  the right coronary artery with proximal right coronary artery lesion that was hypodense at least 80% distal disease and stenosis of a moderate obtuse marginal.  The distal circumflex was very small.  Overall, ventricular function was preserved.  Cardiac  surgery consultation was obtained to consider coronary artery bypass grafting.  On evaluating the patient, it was apparent that he had an abnormal chest x-ray that was obtained which was  abnormal.  This led to a CT scan confirming possible right hilar  adenopathy.  The patient gave a history in addition to at least 8 months of persistent problem with hyponatremia.  We attempted to obtain an MRI of the brain; however,  the patient could not tolerate this and ultimately had a CT of the brain that showed  a known meningioma, but no evidence of metastatic disease to the brain.  With a combination of history of hyponatremia and mediastinal adenopathy, we were considering small cell carcinoma.  The patient continued to have episodes of chest pain while in  the hospital with aggressive medical treatment.  Because of the patient's underlying condition, we felt it most appropriate to proceed with coronary artery bypass grafting and placement of atrial clip and try to obtain a tissue diagnosis, so  postoperatively, appropriate therapy for what appeared to have an underlying malignancy could be performed.  Risks and options were discussed with the patient in detail and he was agreeable and signed informed consent.  DESCRIPTION OF PROCEDURE:  With Swan-Ganz and arterial line monitors in place, the patient underwent general endotracheal anesthesia without incident.  The skin of the chest and legs was prepped with Betadine, draped in the usual sterile manner.   Appropriate timeout was performed and then we proceeded with right greater saphenous endoscopic vein harvesting of the right greater saphenous vein in the thigh.  Median sternotomy was performed.  Left internal mammary artery was dissected down as a  pedicle graft.  The distal artery was divided and had good free flow.  The vessel was hydrostatically dilated with heparinized saline.  The pericardium was opened.  The patient had a slightly blood-tinged pericardial effusion.  Along the superior vena  cava posteriorly, a firm mass could be felt In addition.  There was nodularity behind the aorta at the takeoff of the innominate artery.  The right  pleural space was opened and a firm mass was evident in the right hilum.  Biopsies of the intrapericardial  mass and the right hilar mass were obtained and sent to pathology for frozen section and reported on as a probable small cell carcinoma.  The patient was systemically heparinized.  The ascending aorta was cannulated.  The right atrium was cannulated.   An aortic root vent cardioplegia needle was introduced into the ascending aorta.  The patient was placed on cardiopulmonary bypass 2.4 liters per minute per meter square.  Sites of anastomosis were inspected and were dissected out of the epicardium.  The  patient's body temperature was cooled to 32 degrees.  Aortic crossclamp was applied.  500 mL cold blood potassium cardioplegia was administered with diastolic arrest of the heart.  Myocardial septal temperatures were monitored throughout the crossclamp.   Topical cold solution was also used.  We turned our attention first to the obtuse marginal coronary artery.  The heart was elevated and the largest of the obtuse marginal vessels were located intramyocardial.  The vessel was opened and admitted a  1.5 mm probe proximally and distally.  Using a running 7-0 Prolene, distal anastomosis was performed.  Additional cold blood cardioplegia was administered down the vein grafts.  Attention was then turned to the distal right coronary artery.  This vessel  was highly calcified and could not be opened.  A small area of very diffusely diseased posterior descending at its takeoff was able to be opened and did admit a 1-mm probe distally and proximally.  Using a running 8-0 Prolene, a distal anastomosis was  performed.  Attention was then turned to the left anterior descending coronary artery, which was a relatively small vessel, but admitted a 1.5 mm probe distally.  Using a running 8-0 Prolene, the left internal mammary artery was anastomosed to left  anterior descending coronary artery.  With the cross clamp in  place, the heart was elevated.  The left atrial appendage had previously been measured and we placed an AtriCure atrial clip on the base of the atrial appendage.  With cross clamp still in  place, 2 punch aortotomies were performed and each of the 2 vein grafts were anastomosed to the ascending aorta.  The bulldog was removed from the mammary artery.  The heart was allowed to passively fill and deair.  The aortic crossclamp was removed.   The proximal anastomoses were completed.  There was prompt rise in myocardial septal temperature.  The patient spontaneously converted to a sinus rhythm.  Total crossclamp time was 97 minutes on low dose dopamine.  The patient was rewarmed to 37 degrees.   He was then ventilated and weaned from cardiopulmonary bypass without difficulty.  He remained hemodynamically stable.  TEE showed good myocardial function.  He was decannulated in the usual fashion.  Protamine sulfate was administered.  Atrial and  ventricular pacing wires were applied.  Graft markers were applied and the left pleural tube and Blake mediastinal drain were left in place.  The pericardium was very loosely reapproximated.  Sternum was closed with #6 stainless steel wire.  Fascia  closed with interrupted 0 Vicryl, running 3-0 Vicryl in subcutaneous tissue, 4-0 subcuticular stitch in skin edges.  Dry dressings were applied.  Sponge and needle count was reported as correct at completion of the procedure.    The patient tolerated the procedure without obvious complication and was transferred to the Surgical Intensive Care Unit for further postoperative care.  Total pump time was 141 minutes.  The patient did require packed red blood cells while on bypass because of low preop hematocrit.  AN/NUANCE  D:08/02/2018 T:08/02/2018 JOB:[id number]

## 2018-08-03 ENCOUNTER — Inpatient Hospital Stay (HOSPITAL_COMMUNITY): Payer: BLUE CROSS/BLUE SHIELD

## 2018-08-03 ENCOUNTER — Encounter (HOSPITAL_COMMUNITY): Payer: Self-pay | Admitting: Cardiothoracic Surgery

## 2018-08-03 LAB — COOXEMETRY PANEL
Carboxyhemoglobin: 1.3 % (ref 0.5–1.5)
Methemoglobin: 1.3 % (ref 0.0–1.5)
O2 Saturation: 59.6 %
Total hemoglobin: 9.8 g/dL — ABNORMAL LOW (ref 12.0–16.0)

## 2018-08-03 LAB — BASIC METABOLIC PANEL
Anion gap: 7 (ref 5–15)
BUN: 16 mg/dL (ref 8–23)
CO2: 24 mmol/L (ref 22–32)
Calcium: 7.9 mg/dL — ABNORMAL LOW (ref 8.9–10.3)
Chloride: 96 mmol/L — ABNORMAL LOW (ref 98–111)
Creatinine, Ser: 1.18 mg/dL (ref 0.61–1.24)
GFR calc Af Amer: >60 mL/min (ref >60)
GFR calc non Af Amer: 58 mL/min — ABNORMAL LOW (ref >60)
Glucose, Bld: 141 mg/dL — ABNORMAL HIGH (ref 70–99)
Potassium: 4.2 mmol/L (ref 3.5–5.1)
Sodium: 127 mmol/L — ABNORMAL LOW (ref 135–145)

## 2018-08-03 LAB — GLUCOSE, CAPILLARY
GLUCOSE-CAPILLARY: 125 mg/dL — AB (ref 70–99)
GLUCOSE-CAPILLARY: 131 mg/dL — AB (ref 70–99)
GLUCOSE-CAPILLARY: 143 mg/dL — AB (ref 70–99)
GLUCOSE-CAPILLARY: 147 mg/dL — AB (ref 70–99)
Glucose-Capillary: 103 mg/dL — ABNORMAL HIGH (ref 70–99)
Glucose-Capillary: 135 mg/dL — ABNORMAL HIGH (ref 70–99)
Glucose-Capillary: 141 mg/dL — ABNORMAL HIGH (ref 70–99)
Glucose-Capillary: 141 mg/dL — ABNORMAL HIGH (ref 70–99)
Glucose-Capillary: 146 mg/dL — ABNORMAL HIGH (ref 70–99)

## 2018-08-03 LAB — CBC
HCT: 28.6 % — ABNORMAL LOW (ref 39.0–52.0)
Hemoglobin: 9.2 g/dL — ABNORMAL LOW (ref 13.0–17.0)
MCH: 29.5 pg (ref 26.0–34.0)
MCHC: 32.2 g/dL (ref 30.0–36.0)
MCV: 91.7 fL (ref 78.0–100.0)
Platelets: 185 10*3/uL (ref 150–400)
RBC: 3.12 MIL/uL — ABNORMAL LOW (ref 4.22–5.81)
RDW: 13.6 % (ref 11.5–15.5)
WBC: 15.1 10*3/uL — ABNORMAL HIGH (ref 4.0–10.5)

## 2018-08-03 LAB — MAGNESIUM: Magnesium: 2.1 mg/dL (ref 1.7–2.4)

## 2018-08-03 MED ORDER — METOPROLOL TARTRATE 25 MG/10 ML ORAL SUSPENSION: 12.5000 mg | Freq: Two times a day (BID) | ORAL | Status: DC

## 2018-08-03 MED ORDER — METOPROLOL TARTRATE 25 MG PO TABS
25.0000 mg | ORAL_TABLET | Freq: Two times a day (BID) | ORAL | Status: DC
  Administered 2018-08-03 – 2018-08-04 (×3): 25 mg via ORAL
  Filled 2018-08-03 (×3): qty 1

## 2018-08-03 MED ORDER — GUAIFENESIN ER 600 MG PO TB12
600.0000 mg | ORAL_TABLET | Freq: Two times a day (BID) | ORAL | Status: DC
  Administered 2018-08-03 – 2018-08-06 (×6): 600 mg via ORAL
  Filled 2018-08-03 (×6): qty 1

## 2018-08-03 NOTE — Progress Notes (Addendum)
Gregory Bates [address]       [place] [address]             [id number]      2 Days Post-Op Procedure(s) (LRB): CORONARY ARTERY BYPASS GRAFTING (CABG) TIMES THREE USING LEFT INTERNAL MAMMARY ARTERY TO LAD, RIGHT GREATER SAPHENOUS VEIN GRAFT TO OM AND RCA, VEIN HARVESTED ENDOSCOPICALLY (N/A) TRANSESOPHAGEAL ECHOCARDIOGRAM (TEE) (N/A) CLIPPING OF ATRIAL APPENDAGE (Left) BIOPSY OF PERICARDIAL LYMPH NODE AND HILAR LYMPH NODE Subjective: Really worried about his low sodium this morning. He states it was as low as 118 before surgery.  Objective: Vital signs in last 24 hours: Temp:  [98.1 F (36.7 C)-98.4 F (36.9 C)] 98.3 F (36.8 C) (10/02 0400) Pulse Rate:  [91-136] 94 (10/02 0500) Cardiac Rhythm: Atrial fibrillation (10/01 2000) Resp:  [12-25] 15 (10/02 0500) BP: (103-143)/(54-93) 119/73 (10/02 0500) SpO2:  [83 %-100 %] 99 % (10/02 0500) Arterial Line BP: (69-137)/(46-103) 103/49 (10/02 0400)  Hemodynamic parameters for last 24 hours: PAP: (24-27)/(11-19) 26/19  Intake/Output from previous day: 10/01 0701 - 10/02 0700 In: 1065.9 [P.O.:240; I.V.:825.9] Out: 1565 [Urine:1535; Chest Tube:30] Intake/Output this shift: No intake/output data recorded.  General appearance: alert, cooperative and no distress Heart: irregularly irregular rhythm Lungs: rhonchi in upper and lower fields bilaterally Abdomen: soft, non-tender; bowel sounds normal; no masses,  no organomegaly Extremities: extremities normal, atraumatic, no cyanosis or edema Wound: clean and dry dressed with a sterile dressing  Lab Results: Recent Labs    08/02/18 1638 08/03/18 0422  WBC 15.7* 15.1*  HGB 10.2* 9.2*  HCT 30.9* 28.6*  PLT 182 185   BMET:  Recent Labs    08/02/18 0401 08/02/18 1634 08/02/18 1638 08/03/18 0422  NA 128* 127*  --  127*  K 4.9 4.6  --  4.2  CL 99 95*  --  96*  CO2 22  --   --  24  GLUCOSE 107* 167*  --  141*  BUN 14 17  --  16  CREATININE 1.33* 1.30* 1.37*  1.18  CALCIUM 8.1*  --   --  7.9*    PT/INR:  Recent Labs    08/01/18 1354  LABPROT 16.6*  INR 1.35   ABG    Component Value Date/Time   PHART 7.411 08/02/2018 0119   HCO3 22.9 08/02/2018 0119   TCO2 22 08/02/2018 1634   ACIDBASEDEF 1.0 08/02/2018 0119   O2SAT 59.6 08/03/2018 0450   CBG (last 3)  Recent Labs    08/02/18 2351 08/03/18 0348 08/03/18 0607  GLUCAP 125* 141* 146*    Assessment/Plan: S/P Procedure(s) (LRB): CORONARY ARTERY BYPASS GRAFTING (CABG) TIMES THREE USING LEFT INTERNAL MAMMARY ARTERY TO LAD, RIGHT GREATER SAPHENOUS VEIN GRAFT TO OM AND RCA, VEIN HARVESTED ENDOSCOPICALLY (N/A) TRANSESOPHAGEAL ECHOCARDIOGRAM (TEE) (N/A) CLIPPING OF ATRIAL APPENDAGE (Left) BIOPSY OF PERICARDIAL LYMPH NODE AND HILAR LYMPH NODE  1. CV-rate-controlled afib in the low 100s, BP stable. Hx of atrial fibrillation-on Amio IV. Will increase Lopressor to 25mg  BID. Continue ASA, statin 2. Pulm-tolerating 4L [place] with excellent oxygen saturation 3. Renal-creatinine 1.18, electrolytes okay. Sodium 127-will trend 4. H and H 9.2/28.6-expected acute blood loss anemia 5. Endo-blood glucose well controlled on current regimen 6. DVT prophylaxis-on Lovenox  Plan: Increase metoprolol to assist with rhythm control, OOB to the chair. Chest tubes can probably be removed-there was a very small amount of output and no air leak. Continue to encourage incentive spirometer and wean oxygen as tolerated. Advance diet as tolerated-he  appears to be still on liquids with no nausea or vomiting.    LOS: 7 days    Elgie Collard 08/03/2018  Patient has had chronic problem with hyponatremia , likely related to small cell lung cancer  Continue to monitor and use fluid restriction  Still in a fib. I have seen and examined Gregory Bates and agree with the above assessment  and plan.  Grace Isaac MD Beeper [phone] Office [phone] 08/03/2018 9:12 AM

## 2018-08-03 NOTE — Plan of Care (Signed)

## 2018-08-03 NOTE — Progress Notes (Signed)
Left pleural chest tube removed per protocol as ordered by Dr. Servando Snare.  Chest tube was not present on [place] as MT was removed yesterday.

## 2018-08-03 NOTE — Progress Notes (Signed)
[facility] BRIEF SICU PROGRESS NOTE  2 Days Post-Op  S/P Procedure(s) (LRB): CORONARY ARTERY BYPASS GRAFTING (CABG) TIMES THREE USING LEFT INTERNAL MAMMARY ARTERY TO LAD, RIGHT GREATER SAPHENOUS VEIN GRAFT TO OM AND RCA, VEIN HARVESTED ENDOSCOPICALLY (N/A) TRANSESOPHAGEAL ECHOCARDIOGRAM (TEE) (N/A) CLIPPING OF ATRIAL APPENDAGE (Left) BIOPSY OF PERICARDIAL LYMPH NODE AND HILAR LYMPH NODE   Stable day Afib HR 100-120 w/ stable BP Breathing comfortably on room air UOP adequate  Plan: Continue current plan  Rexene Alberts, MD 08/03/2018 6:43 PM

## 2018-08-03 NOTE — Progress Notes (Signed)
Progress Note  Patient Name: Gregory Bates Date of Encounter: 08/03/2018  Primary Cardiologist: Lauree Chandler, MD    Subjective   No complaints has only been OOB to chair   Inpatient Medications    Scheduled Meds: . acetaminophen  1,000 mg Oral Q6H   Or  . acetaminophen (TYLENOL) oral liquid 160 mg/5 mL  1,000 mg Per Tube Q6H  . amiodarone  400 mg Oral Q12H   Followed by  . [START ON 08/10/2018] amiodarone  400 mg Oral Daily  . aspirin EC  325 mg Oral Daily   Or  . aspirin  324 mg Per Tube Daily  . atorvastatin  20 mg Oral q1800  . bisacodyl  10 mg Oral Daily   Or  . bisacodyl  10 mg Rectal Daily  . Chlorhexidine Gluconate Cloth  6 each Topical Daily  . docusate sodium  200 mg Oral Daily  . enoxaparin (LOVENOX) injection  30 mg Subcutaneous QHS  . fluticasone  2 spray Each Nare BID  . insulin aspart  0-24 Units Subcutaneous Q4H  . insulin detemir  10 Units Subcutaneous Daily  . levalbuterol  0.63 mg Nebulization Q6H  . loratadine  10 mg Oral Daily  . mouth rinse  15 mL Mouth Rinse BID  . metoprolol tartrate  12.5 mg Oral BID   Or  . metoprolol tartrate  12.5 mg Per Tube BID  . mupirocin ointment  1 application Nasal BID  . pantoprazole  40 mg Oral Daily  . sodium chloride flush  10-40 mL Intracatheter Q12H  . sodium chloride flush  3 mL Intravenous Q12H   Continuous Infusions: . sodium chloride Stopped (08/02/18 0819)  . sodium chloride    . sodium chloride 10 mL/hr at 08/01/18 1346  . amiodarone    . dexmedetomidine (PRECEDEX) IV infusion Stopped (08/01/18 2306)  . lactated ringers    . lactated ringers    . lactated ringers Stopped (08/02/18 2125)  . nitroGLYCERIN Stopped (08/02/18 1015)  . phenylephrine (NEO-SYNEPHRINE) Adult infusion Stopped (08/01/18 1942)   PRN Meds: sodium chloride, lactated ringers, metoprolol tartrate, midazolam, morphine injection, ondansetron (ZOFRAN) IV, oxyCODONE, sodium chloride flush, sodium chloride flush, traMADol    Vital Signs    Vitals:   08/03/18 0300 08/03/18 0400 08/03/18 0500 08/03/18 0700  BP: (!) 112/93 130/70 119/73   Pulse: (!) 105 (!) 113 94   Resp: 17 (!) 21 15   Temp:  98.3 F (36.8 C)  98.2 F (36.8 C)  TempSrc:  Oral  Oral  SpO2: 99% 99% 99%   Weight:      Height:        Intake/Output Summary (Last 24 hours) at 08/03/2018 0814 Last data filed at 08/03/2018 0300 Gross per 24 hour  Intake 760.98 ml  Output 1485 ml  Net -724.02 ml   Filed Weights   07/31/18 0318 08/01/18 0600 08/02/18 0500  Weight: 85.3 kg 84.7 kg 88.1 kg    Telemetry    afib rates 100  - Personally Reviewed  ECG    afib old IMI  - Personally Reviewed  Physical Exam  Post sternotomy  GEN: No acute distress.   Neck: No JVD Cardiac: RRR, no murmurs, rubs, or gallops.  Respiratory: midl expiratory wheezing  GI: Soft, nontender, non-distended  MS: No edema; No deformity. Neuro:  Nonfocal  Psych: Normal affect   Labs    Chemistry Recent Labs  Lab 07/30/18 0303  08/01/18 0259  08/02/18 0401 08/02/18 1634 08/02/18  1638 08/03/18 0422  NA 124*   < > 128*   < > 128* 127*  --  127*  K 4.7   < > 4.7   < > 4.9 4.6  --  4.2  CL 92*   < > 96*   < > 99 95*  --  96*  CO2 22   < > 24  --  22  --   --  24  GLUCOSE 105*   < > 118*   < > 107* 167*  --  141*  BUN 12   < > 16   < > 14 17  --  16  CREATININE 1.27*   < > 1.28*   < > 1.33* 1.30* 1.37* 1.18  CALCIUM 8.9   < > 8.6*  --  8.1*  --   --  7.9*  PROT 6.4*  --   --   --   --   --   --   --   ALBUMIN 3.8  --   --   --   --   --   --   --   AST 39  --   --   --   --   --   --   --   ALT 50*  --   --   --   --   --   --   --   ALKPHOS 63  --   --   --   --   --   --   --   BILITOT 0.7  --   --   --   --   --   --   --   GFRNONAA 53*   < > 52*   < > 50*  --  48* 58*  GFRAA >60   < > >60   < > 58*  --  56* >60  ANIONGAP 10   < > 8  --  7  --   --  7   < > = values in this interval not displayed.     Hematology Recent Labs  Lab  08/02/18 0401 08/02/18 1634 08/02/18 1638 08/03/18 0422  WBC 11.8*  --  15.7* 15.1*  RBC 3.58*  --  3.45* 3.12*  HGB 10.6* 10.2* 10.2* 9.2*  HCT 32.1* 30.0* 30.9* 28.6*  MCV 89.7  --  89.6 91.7  MCH 29.6  --  29.6 29.5  MCHC 33.0  --  33.0 32.2  RDW 13.2  --  13.2 13.6  PLT 176  --  182 185    Cardiac Enzymes Recent Labs  Lab 07/30/18 0303 07/30/18 0918 07/30/18 1436  TROPONINI <0.03 <0.03 <0.03   No results for input(s): TROPIPOC in the last 168 hours.   BNPNo results for input(s): BNP, PROBNP in the last 168 hours.   DDimer No results for input(s): DDIMER in the last 168 hours.   Radiology    Dg Chest Port 1 View  Result Date: 08/02/2018 CLINICAL DATA:  Chest tube present, follow-up EXAM: PORTABLE CHEST 1 VIEW COMPARISON:  Chest x-ray of 08/01/2017 FINDINGS: The endotracheal tube and NG tube have been removed. A left chest tube remains and no pneumothorax is noted. There is mild basilar volume loss. Right Swan-Ganz catheter is coiled in main pulmonary artery. Cardiomegaly is stable. There may be very mild pulmonary vascular congestion present and small effusions cannot be excluded. Median sternotomy sutures are present IMPRESSION: 1. Endotracheal tube and NG tube removed.  2. Little change in aeration with mild basilar volume loss and possible small effusions. 3. Left chest tube remains with no pneumothorax. 4. Little change in cardiomegaly and possible minimal pulmonary vascular congestion. Electronically Signed   By: Ivar Drape M.D.   On: 08/02/2018 08:15   Dg Chest Port 1 View  Result Date: 08/01/2018 CLINICAL DATA:  Status post CABG EXAM: PORTABLE CHEST 1 VIEW COMPARISON:  07/30/2018 FINDINGS: ET tube tip is above the carina. Enteric tube tip is below the field of view. There is an IJ catheter with tip looped in the right ventricular outflow tract. Mediastinal drain and left chest tubes identified. No pneumothorax identified. Mild edema identified within the right base. No  airspace densities identified. Right perihilar and right paratracheal lung mass again noted. IMPRESSION: 1. Postoperative changes from CABG procedure. No complications identified. 2. Right perihilar and right paratracheal lung mass as before. Electronically Signed   By: Kerby Moors M.D.   On: 08/01/2018 14:25    Cardiac Studies   TTE EF 55-60% mild LAE  Patient Profile     77 y.o. male post CABG with LIMA to LAD, SVG to OM, SVG to PDA and LA clip. Also found To have lung cancer with right hilar mass  Assessment & Plan    1. CABG/CAD:  Mobilize is volume overloaded CXR this am looks wet with cephalization Lasix per CVTS 2. PAF:  Continue amiodarone increase metoprolol to 25 bid. Will need anticoagulation prior to d/c LAA was clipped at time of surgery  3. Lung cancer. With right hilar mass will need oncology f/u        For questions or updates, please contact [facility] [place] Please consult [email or web address] for contact info under        Signed, Jenkins Rouge, MD  08/03/2018, 8:14 AM

## 2018-08-03 NOTE — Progress Notes (Signed)
Dr. Servando Snare advised this RN that the Amiodarone IV gtt does not get discontinued until he converts to NSR.

## 2018-08-04 LAB — BPAM RBC
Blood Product Expiration Date: 201910132359
Blood Product Expiration Date: 201910132359
Blood Product Expiration Date: 201910152359
Blood Product Expiration Date: 201910182359
ISSUE DATE / TIME: 201909241244
ISSUE DATE / TIME: 201909300758
ISSUE DATE / TIME: 201910021304
Unit Type and Rh: 7300
Unit Type and Rh: 7300
Unit Type and Rh: 7300
Unit Type and Rh: 7300

## 2018-08-04 LAB — GLUCOSE, CAPILLARY
GLUCOSE-CAPILLARY: 90 mg/dL (ref 70–99)
GLUCOSE-CAPILLARY: 101 mg/dL — AB (ref 70–99)
Glucose-Capillary: 122 mg/dL — ABNORMAL HIGH (ref 70–99)
Glucose-Capillary: 117 mg/dL — ABNORMAL HIGH (ref 70–99)
Glucose-Capillary: 121 mg/dL — ABNORMAL HIGH (ref 70–99)

## 2018-08-04 LAB — TYPE AND SCREEN
ABO/RH(D): B POS
Antibody Screen: NEGATIVE
Unit division: 0
Unit division: 0
Unit division: 0
Unit division: 0

## 2018-08-04 LAB — BASIC METABOLIC PANEL
Anion gap: 10 (ref 5–15)
BUN: 21 mg/dL (ref 8–23)
CO2: 24 mmol/L (ref 22–32)
Calcium: 8.3 mg/dL — ABNORMAL LOW (ref 8.9–10.3)
Chloride: 94 mmol/L — ABNORMAL LOW (ref 98–111)
Creatinine, Ser: 1.29 mg/dL — ABNORMAL HIGH (ref 0.61–1.24)
GFR calc Af Amer: >60 mL/min (ref >60)
GFR calc non Af Amer: 52 mL/min — ABNORMAL LOW (ref >60)
Glucose, Bld: 113 mg/dL — ABNORMAL HIGH (ref 70–99)
Potassium: 4.2 mmol/L (ref 3.5–5.1)
Sodium: 128 mmol/L — ABNORMAL LOW (ref 135–145)

## 2018-08-04 LAB — CBC
HCT: 29.4 % — ABNORMAL LOW (ref 39.0–52.0)
Hemoglobin: 9.6 g/dL — ABNORMAL LOW (ref 13.0–17.0)
MCH: 30.2 pg (ref 26.0–34.0)
MCHC: 32.7 g/dL (ref 30.0–36.0)
MCV: 92.5 fL (ref 78.0–100.0)
Platelets: 191 10*3/uL (ref 150–400)
RBC: 3.18 MIL/uL — ABNORMAL LOW (ref 4.22–5.81)
RDW: 13.5 % (ref 11.5–15.5)
WBC: 11.7 10*3/uL — ABNORMAL HIGH (ref 4.0–10.5)

## 2018-08-04 MED ORDER — SODIUM CHLORIDE 0.9 % IV SOLN: 250.0000 mL | INTRAVENOUS | Status: DC | PRN

## 2018-08-04 MED ORDER — SODIUM CHLORIDE 0.9% FLUSH: 3.0000 mL | INTRAVENOUS | Status: DC | PRN

## 2018-08-04 MED ORDER — METOPROLOL TARTRATE 25 MG PO TABS: 25.0000 mg | ORAL_TABLET | Freq: Two times a day (BID) | ORAL | Status: DC

## 2018-08-04 MED ORDER — ASPIRIN EC 81 MG PO TBEC
81.0000 mg | DELAYED_RELEASE_TABLET | Freq: Every day | ORAL | Status: DC
  Administered 2018-08-05 – 2018-08-06 (×2): 81 mg via ORAL
  Filled 2018-08-04 (×2): qty 1

## 2018-08-04 MED ORDER — ACETAMINOPHEN 325 MG PO TABS: 650.0000 mg | ORAL_TABLET | Freq: Four times a day (QID) | ORAL | Status: DC | PRN

## 2018-08-04 MED ORDER — MOVING RIGHT ALONG BOOK
Freq: Once | Status: AC
  Administered 2018-08-04: 14:00:00
  Filled 2018-08-04: qty 1

## 2018-08-04 MED ORDER — LEVALBUTEROL HCL 0.63 MG/3ML IN NEBU
0.6300 mg | INHALATION_SOLUTION | Freq: Two times a day (BID) | RESPIRATORY_TRACT | Status: DC
  Administered 2018-08-05 – 2018-08-06 (×3): 0.63 mg via RESPIRATORY_TRACT
  Filled 2018-08-04 (×3): qty 3

## 2018-08-04 MED ORDER — ONDANSETRON HCL 4 MG/2ML IJ SOLN
4.0000 mg | Freq: Four times a day (QID) | INTRAMUSCULAR | Status: DC | PRN
  Administered 2018-08-04 – 2018-08-05 (×2): 4 mg via INTRAVENOUS
  Filled 2018-08-04 (×2): qty 2

## 2018-08-04 MED ORDER — ONDANSETRON HCL 4 MG PO TABS: 4.0000 mg | ORAL_TABLET | Freq: Four times a day (QID) | ORAL | Status: DC | PRN

## 2018-08-04 MED ORDER — FUROSEMIDE 40 MG PO TABS: 40.0000 mg | ORAL_TABLET | Freq: Every day | ORAL | Status: DC

## 2018-08-04 MED ORDER — BISACODYL 5 MG PO TBEC: 10.0000 mg | DELAYED_RELEASE_TABLET | Freq: Every day | ORAL | Status: DC | PRN

## 2018-08-04 MED ORDER — BISACODYL 10 MG RE SUPP: 10.0000 mg | Freq: Every day | RECTAL | Status: DC | PRN

## 2018-08-04 MED ORDER — SODIUM CHLORIDE 0.9% FLUSH
3.0000 mL | Freq: Two times a day (BID) | INTRAVENOUS | Status: DC
  Administered 2018-08-04 – 2018-08-06 (×4): 3 mL via INTRAVENOUS

## 2018-08-04 MED ORDER — PANTOPRAZOLE SODIUM 40 MG PO TBEC
40.0000 mg | DELAYED_RELEASE_TABLET | Freq: Every day | ORAL | Status: DC
  Administered 2018-08-05 – 2018-08-06 (×2): 40 mg via ORAL
  Filled 2018-08-04 (×2): qty 1

## 2018-08-04 MED ORDER — ALUM & MAG HYDROXIDE-SIMETH 200-200-20 MG/5ML PO SUSP: 15.0000 mL | ORAL | Status: DC | PRN

## 2018-08-04 MED ORDER — DOCUSATE SODIUM 100 MG PO CAPS
200.0000 mg | ORAL_CAPSULE | Freq: Every day | ORAL | Status: DC
  Filled 2018-08-04: qty 2

## 2018-08-04 MED ORDER — POTASSIUM CHLORIDE CRYS ER 10 MEQ PO TBCR
10.0000 meq | EXTENDED_RELEASE_TABLET | Freq: Every day | ORAL | Status: DC
  Administered 2018-08-04: 10 meq via ORAL
  Filled 2018-08-04: qty 1

## 2018-08-04 MED ORDER — METFORMIN HCL 500 MG PO TABS: 500.0000 mg | ORAL_TABLET | Freq: Every day | ORAL | Status: DC

## 2018-08-04 MED ORDER — LEVALBUTEROL HCL 0.63 MG/3ML IN NEBU: 0.6300 mg | INHALATION_SOLUTION | Freq: Four times a day (QID) | RESPIRATORY_TRACT | Status: DC | PRN

## 2018-08-04 MED ORDER — INSULIN ASPART 100 UNIT/ML ~~LOC~~ SOLN
0.0000 [IU] | Freq: Three times a day (TID) | SUBCUTANEOUS | Status: DC
  Administered 2018-08-04 – 2018-08-05 (×2): 2 [IU] via SUBCUTANEOUS

## 2018-08-04 MED ORDER — FUROSEMIDE 10 MG/ML IJ SOLN
20.0000 mg | Freq: Once | INTRAMUSCULAR | Status: AC
  Administered 2018-08-04: 20 mg via INTRAVENOUS
  Filled 2018-08-04: qty 2

## 2018-08-04 MED ORDER — METOPROLOL TARTRATE 12.5 MG HALF TABLET
12.5000 mg | ORAL_TABLET | Freq: Two times a day (BID) | ORAL | Status: DC
  Administered 2018-08-05: 12.5 mg via ORAL
  Filled 2018-08-04: qty 1

## 2018-08-04 MED ORDER — OXYCODONE HCL 5 MG PO TABS: 5.0000 mg | ORAL_TABLET | ORAL | Status: DC | PRN

## 2018-08-04 MED ORDER — TRAMADOL HCL 50 MG PO TABS: 50.0000 mg | ORAL_TABLET | ORAL | Status: DC | PRN

## 2018-08-04 NOTE — Progress Notes (Signed)
Progress Note  Patient Name: Gregory Bates Date of Encounter: 08/04/2018  Primary Cardiologist: Lauree Chandler, MD    Subjective   No complaints sitting in chair HR 100 range   Inpatient Medications    Scheduled Meds: . acetaminophen  1,000 mg Oral Q6H   Or  . acetaminophen (TYLENOL) oral liquid 160 mg/5 mL  1,000 mg Per Tube Q6H  . amiodarone  400 mg Oral Q12H   Followed by  . [START ON 08/10/2018] amiodarone  400 mg Oral Daily  . aspirin EC  325 mg Oral Daily   Or  . aspirin  324 mg Per Tube Daily  . atorvastatin  20 mg Oral q1800  . bisacodyl  10 mg Oral Daily   Or  . bisacodyl  10 mg Rectal Daily  . Chlorhexidine Gluconate Cloth  6 each Topical Daily  . docusate sodium  200 mg Oral Daily  . enoxaparin (LOVENOX) injection  30 mg Subcutaneous QHS  . fluticasone  2 spray Each Nare BID  . guaiFENesin  600 mg Oral BID  . insulin aspart  0-24 Units Subcutaneous Q4H  . insulin detemir  10 Units Subcutaneous Daily  . levalbuterol  0.63 mg Nebulization Q6H  . loratadine  10 mg Oral Daily  . mouth rinse  15 mL Mouth Rinse BID  . metoprolol tartrate  25 mg Oral BID   Or  . metoprolol tartrate  12.5 mg Per Tube BID  . mupirocin ointment  1 application Nasal BID  . pantoprazole  40 mg Oral Daily  . sodium chloride flush  10-40 mL Intracatheter Q12H  . sodium chloride flush  3 mL Intravenous Q12H   Continuous Infusions: . amiodarone 30 mg/hr (08/03/18 2227)  . dexmedetomidine (PRECEDEX) IV infusion Stopped (08/03/18 0906)  . lactated ringers    . lactated ringers Stopped (08/02/18 2125)  . nitroGLYCERIN Stopped (08/02/18 1015)  . phenylephrine (NEO-SYNEPHRINE) Adult infusion Stopped (08/01/18 1942)   PRN Meds: lactated ringers, metoprolol tartrate, midazolam, morphine injection, ondansetron (ZOFRAN) IV, oxyCODONE, sodium chloride flush, sodium chloride flush, traMADol   Vital Signs    Vitals:   08/04/18 0400 08/04/18 0500 08/04/18 0600 08/04/18 0700  BP: (!)  98/51 (!) 106/57 (!) 110/57 90/60  Pulse: (!) 119 (!) 106  (!) 108  Resp: 19 18 (!) 26 14  Temp:      TempSrc:      SpO2: 91%  95% 94%  Weight:  86.1 kg    Height:        Intake/Output Summary (Last 24 hours) at 08/04/2018 0728 Last data filed at 08/04/2018 0600 Gross per 24 hour  Intake 1141.31 ml  Output 730 ml  Net 411.31 ml   Filed Weights   08/01/18 0600 08/02/18 0500 08/04/18 0500  Weight: 84.7 kg 88.1 kg 86.1 kg    Telemetry    afib rates 100  - Personally Reviewed  ECG    afib old IMI  - Personally Reviewed  Physical Exam  Post sternotomy  GEN: No acute distress.   Neck: No JVD Cardiac: RRR, no murmurs, rubs, or gallops.  Respiratory: midl expiratory wheezing  GI: Soft, nontender, non-distended  MS: No edema; No deformity. Neuro:  Nonfocal  Psych: Normal affect   Labs    Chemistry Recent Labs  Lab 07/30/18 0303  08/02/18 0401 08/02/18 1634 08/02/18 1638 08/03/18 0422 08/04/18 0624  NA 124*   < > 128* 127*  --  127* 128*  K 4.7   < >  4.9 4.6  --  4.2 4.2  CL 92*   < > 99 95*  --  96* 94*  CO2 22   < > 22  --   --  24 24  GLUCOSE 105*   < > 107* 167*  --  141* 113*  BUN 12   < > 14 17  --  16 21  CREATININE 1.27*   < > 1.33* 1.30* 1.37* 1.18 1.29*  CALCIUM 8.9   < > 8.1*  --   --  7.9* 8.3*  PROT 6.4*  --   --   --   --   --   --   ALBUMIN 3.8  --   --   --   --   --   --   AST 39  --   --   --   --   --   --   ALT 50*  --   --   --   --   --   --   ALKPHOS 63  --   --   --   --   --   --   BILITOT 0.7  --   --   --   --   --   --   GFRNONAA 53*   < > 50*  --  48* 58* 52*  GFRAA >60   < > 58*  --  56* >60 >60  ANIONGAP 10   < > 7  --   --  7 10   < > = values in this interval not displayed.     Hematology Recent Labs  Lab 08/02/18 0401 08/02/18 1634 08/02/18 1638 08/03/18 0422  WBC 11.8*  --  15.7* 15.1*  RBC 3.58*  --  3.45* 3.12*  HGB 10.6* 10.2* 10.2* 9.2*  HCT 32.1* 30.0* 30.9* 28.6*  MCV 89.7  --  89.6 91.7  MCH 29.6  --   29.6 29.5  MCHC 33.0  --  33.0 32.2  RDW 13.2  --  13.2 13.6  PLT 176  --  182 185    Cardiac Enzymes Recent Labs  Lab 07/30/18 0303 07/30/18 0918 07/30/18 1436  TROPONINI <0.03 <0.03 <0.03   No results for input(s): TROPIPOC in the last 168 hours.   BNPNo results for input(s): BNP, PROBNP in the last 168 hours.   DDimer No results for input(s): DDIMER in the last 168 hours.   Radiology    Dg Chest Port 1 View  Result Date: 08/03/2018 CLINICAL DATA:  Follow-up chest tube on the left EXAM: PORTABLE CHEST 1 VIEW COMPARISON:  08/02/2018 FINDINGS: Cardiac shadow is enlarged but stable. Postsurgical changes are again seen. The Swan-Ganz catheter and mediastinal drain have been removed in the interval. Left thoracostomy catheter remains. No definitive pneumothorax is seen. Increasing density is noted in the right upper lobe which may be related to some atelectasis and volume loss. No bony abnormality is seen IMPRESSION: Slight increase in the degree of right upper lobe atelectasis. No pneumothorax is noted on the left. Electronically Signed   By: Inez Catalina M.D.   On: 08/03/2018 08:37    Cardiac Studies   TTE EF 55-60% mild LAE  Patient Profile     77 y.o. male post CABG with LIMA to LAD, SVG to OM, SVG to PDA and LA clip. Also found To have lung cancer with right hilar mass  Assessment & Plan    1. CABG/CAD:  Mobilize diuresing pain control  good  2. PAF:  Continue amiodarone and lopressor BP soft start anticoagulation prior to d/c and convert to oral amiodarone Day before d/c  3. Lung cancer. With right hilar mass will need oncology f/u        For questions or updates, please contact [place] Please consult [email or web address] for contact info under        Signed, Jenkins Rouge, MD  08/04/2018, 7:28 AM

## 2018-08-04 NOTE — Progress Notes (Addendum)
[address]       [place] [address]             [phone]      3 Days Post-Op Procedure(s) (LRB): CORONARY ARTERY BYPASS GRAFTING (CABG) TIMES THREE USING LEFT INTERNAL MAMMARY ARTERY TO LAD, RIGHT GREATER SAPHENOUS VEIN GRAFT TO OM AND RCA, VEIN HARVESTED ENDOSCOPICALLY (N/A) TRANSESOPHAGEAL ECHOCARDIOGRAM (TEE) (N/A) CLIPPING OF ATRIAL APPENDAGE (Left) BIOPSY OF PERICARDIAL LYMPH NODE AND HILAR LYMPH NODE Subjective: Feels ok, ambulated this am, denies SOB  Objective: Vital signs in last 24 hours: Temp:  [97.7 F (36.5 C)-98.3 F (36.8 C)] 98.3 F (36.8 C) (10/03 0736) Pulse Rate:  [36-120] 118 (10/03 0800) Cardiac Rhythm: Atrial fibrillation (10/03 0800) Resp:  [13-36] 23 (10/03 0800) BP: (85-125)/(51-114) 103/55 (10/03 0800) SpO2:  [91 %-99 %] 98 % (10/03 0800) Weight:  [86.1 kg] 86.1 kg (10/03 0500)  Hemodynamic parameters for last 24 hours:    Intake/Output from previous day: 10/02 0701 - 10/03 0700 In: 1141.3 [P.O.:775; I.V.:366.3] Out: 730 [Urine:600; Chest Tube:130] Intake/Output this shift: Total I/O In: 152.6 [P.O.:120; I.V.:32.6] Out: -   General appearance: alert, cooperative and no distress Heart: irregularly irregular rhythm Lungs: clear to auscultation bilaterally Abdomen: benign Extremities: no edema Wound: evh site ok, chest dressing intact  Lab Results: Recent Labs    08/03/18 0422 08/04/18 0624  WBC 15.1* 11.7*  HGB 9.2* 9.6*  HCT 28.6* 29.4*  PLT 185 191   BMET:  Recent Labs    08/03/18 0422 08/04/18 0624  NA 127* 128*  K 4.2 4.2  CL 96* 94*  CO2 24 24  GLUCOSE 141* 113*  BUN 16 21  CREATININE 1.18 1.29*  CALCIUM 7.9* 8.3*    PT/INR:  Recent Labs    08/01/18 1354  LABPROT 16.6*  INR 1.35   ABG    Component Value Date/Time   PHART 7.411 08/02/2018 0119   HCO3 22.9 08/02/2018 0119   TCO2 22 08/02/2018 1634   ACIDBASEDEF 1.0 08/02/2018 0119   O2SAT 59.6 08/03/2018 0450   CBG (last 3)    Recent Labs    08/03/18 1934 08/03/18 2343 08/04/18 0341  GLUCAP 143* 103* 90    Meds Scheduled Meds: . acetaminophen  1,000 mg Oral Q6H   Or  . acetaminophen (TYLENOL) oral liquid 160 mg/5 mL  1,000 mg Per Tube Q6H  . amiodarone  400 mg Oral Q12H   Followed by  . [START ON 08/10/2018] amiodarone  400 mg Oral Daily  . aspirin EC  325 mg Oral Daily   Or  . aspirin  324 mg Per Tube Daily  . atorvastatin  20 mg Oral q1800  . bisacodyl  10 mg Oral Daily   Or  . bisacodyl  10 mg Rectal Daily  . Chlorhexidine Gluconate Cloth  6 each Topical Daily  . docusate sodium  200 mg Oral Daily  . enoxaparin (LOVENOX) injection  30 mg Subcutaneous QHS  . fluticasone  2 spray Each Nare BID  . guaiFENesin  600 mg Oral BID  . insulin aspart  0-24 Units Subcutaneous Q4H  . insulin detemir  10 Units Subcutaneous Daily  . levalbuterol  0.63 mg Nebulization Q6H  . loratadine  10 mg Oral Daily  . mouth rinse  15 mL Mouth Rinse BID  . metoprolol tartrate  25 mg Oral BID   Or  . metoprolol tartrate  12.5 mg Per Tube BID  . mupirocin ointment  1  application Nasal BID  . pantoprazole  40 mg Oral Daily  . sodium chloride flush  10-40 mL Intracatheter Q12H  . sodium chloride flush  3 mL Intravenous Q12H   Continuous Infusions: . amiodarone 30 mg/hr (08/04/18 0800)  . dexmedetomidine (PRECEDEX) IV infusion Stopped (08/03/18 0906)  . lactated ringers    . lactated ringers Stopped (08/02/18 2125)  . nitroGLYCERIN Stopped (08/02/18 1015)  . phenylephrine (NEO-SYNEPHRINE) Adult infusion Stopped (08/01/18 1942)   PRN Meds:.lactated ringers, metoprolol tartrate, midazolam, morphine injection, ondansetron (ZOFRAN) IV, oxyCODONE, sodium chloride flush, sodium chloride flush, traMADol  Xrays Dg Chest Port 1 View  Result Date: 08/03/2018 CLINICAL DATA:  Follow-up chest tube on the left EXAM: PORTABLE CHEST 1 VIEW COMPARISON:  08/02/2018 FINDINGS: Cardiac shadow is enlarged but stable. Postsurgical  changes are again seen. The Swan-Ganz catheter and mediastinal drain have been removed in the interval. Left thoracostomy catheter remains. No definitive pneumothorax is seen. Increasing density is noted in the right upper lobe which may be related to some atelectasis and volume loss. No bony abnormality is seen IMPRESSION: Slight increase in the degree of right upper lobe atelectasis. No pneumothorax is noted on the left. Electronically Signed   By: Inez Catalina M.D.   On: 08/03/2018 08:37    Assessment/Plan: S/P Procedure(s) (LRB): CORONARY ARTERY BYPASS GRAFTING (CABG) TIMES THREE USING LEFT INTERNAL MAMMARY ARTERY TO LAD, RIGHT GREATER SAPHENOUS VEIN GRAFT TO OM AND RCA, VEIN HARVESTED ENDOSCOPICALLY (N/A) TRANSESOPHAGEAL ECHOCARDIOGRAM (TEE) (N/A) CLIPPING OF ATRIAL APPENDAGE (Left) BIOPSY OF PERICARDIAL LYMPH NODE AND HILAR LYMPH NODE   1 overall progressing well 2 afib on amio and metoprolol, cont to monitor , may need additional management 3 sodium now 128 - cont fluid restriction, Poss SIADH related to lung cancer, + small cell on pathology- will need oncology F/U 4 ABL anemia is stable 5 creat up slightly- cont to monitor 6 adeq control of BS 7 + productive cough- cont pulm toilet, lungs clear on exam, no fevers- monitor closely for pneumonia/bronchitis 8 routine mobilization, cardiac rehab  LOS: 8 days    Gregory Bates 08/04/2018  patient discussed at Thoracic oncology conference, will need PET as outpatient , rx with radiation and chemo Still in afib will need to resume eliquis, atrial clip in place I have seen and examined Gregory Bates and agree with the above assessment  and plan.  Grace Isaac MD Beeper [phone] Office [phone] 08/04/2018 12:28 PM

## 2018-08-04 NOTE — Progress Notes (Signed)
Cybill  RN aware of d/c cvc order.

## 2018-08-04 NOTE — Progress Notes (Signed)
Attempted to ambulate patient after he ate lunch. Upon standing patient c/o feeling lightheaded and nauseated. Vital signs stable. Assisted back to bed without issue. Administered prn zofran for nausea. Removed EPW and right IJ introducer per orders. Patient to remain on bedrest for one hour. Will closely monitor vital signs.  Joellen Jersey, RN

## 2018-08-05 ENCOUNTER — Encounter: Payer: Self-pay | Admitting: *Deleted

## 2018-08-05 ENCOUNTER — Inpatient Hospital Stay (HOSPITAL_COMMUNITY): Payer: BLUE CROSS/BLUE SHIELD

## 2018-08-05 LAB — COMPREHENSIVE METABOLIC PANEL
ALT: 35 U/L (ref 0–44)
AST: 24 U/L (ref 15–41)
Albumin: 2.9 g/dL — ABNORMAL LOW (ref 3.5–5.0)
Alkaline Phosphatase: 49 U/L (ref 38–126)
Anion gap: 10 (ref 5–15)
BUN: 21 mg/dL (ref 8–23)
CO2: 25 mmol/L (ref 22–32)
Calcium: 8 mg/dL — ABNORMAL LOW (ref 8.9–10.3)
Chloride: 93 mmol/L — ABNORMAL LOW (ref 98–111)
Creatinine, Ser: 1.5 mg/dL — ABNORMAL HIGH (ref 0.61–1.24)
GFR calc Af Amer: 50 mL/min — ABNORMAL LOW (ref >60)
GFR calc non Af Amer: 43 mL/min — ABNORMAL LOW (ref >60)
Glucose, Bld: 109 mg/dL — ABNORMAL HIGH (ref 70–99)
Potassium: 4.1 mmol/L (ref 3.5–5.1)
Sodium: 128 mmol/L — ABNORMAL LOW (ref 135–145)
Total Bilirubin: 0.8 mg/dL (ref 0.3–1.2)
Total Protein: 5.7 g/dL — ABNORMAL LOW (ref 6.5–8.1)

## 2018-08-05 LAB — GLUCOSE, CAPILLARY
Glucose-Capillary: 91 mg/dL (ref 70–99)
Glucose-Capillary: 122 mg/dL — ABNORMAL HIGH (ref 70–99)
Glucose-Capillary: 138 mg/dL — ABNORMAL HIGH (ref 70–99)
Glucose-Capillary: 137 mg/dL — ABNORMAL HIGH (ref 70–99)

## 2018-08-05 LAB — CBC
HCT: 29.7 % — ABNORMAL LOW (ref 39.0–52.0)
Hemoglobin: 9.6 g/dL — ABNORMAL LOW (ref 13.0–17.0)
MCH: 29.7 pg (ref 26.0–34.0)
MCHC: 32.3 g/dL (ref 30.0–36.0)
MCV: 92 fL (ref 78.0–100.0)
Platelets: 220 10*3/uL (ref 150–400)
RBC: 3.23 MIL/uL — ABNORMAL LOW (ref 4.22–5.81)
RDW: 13.5 % (ref 11.5–15.5)
WBC: 13 10*3/uL — ABNORMAL HIGH (ref 4.0–10.5)

## 2018-08-05 MED ORDER — POTASSIUM CHLORIDE CRYS ER 10 MEQ PO TBCR
10.0000 meq | EXTENDED_RELEASE_TABLET | Freq: Every day | ORAL | Status: DC
  Administered 2018-08-06: 10 meq via ORAL
  Filled 2018-08-05: qty 1

## 2018-08-05 MED ORDER — METOPROLOL TARTRATE 25 MG PO TABS
25.0000 mg | ORAL_TABLET | Freq: Two times a day (BID) | ORAL | Status: DC
  Administered 2018-08-05 – 2018-08-06 (×2): 25 mg via ORAL
  Filled 2018-08-05 (×2): qty 1

## 2018-08-05 MED ORDER — APIXABAN 5 MG PO TABS
5.0000 mg | ORAL_TABLET | Freq: Two times a day (BID) | ORAL | Status: DC
  Administered 2018-08-05 – 2018-08-06 (×3): 5 mg via ORAL
  Filled 2018-08-05 (×3): qty 1

## 2018-08-05 MED ORDER — FUROSEMIDE 40 MG PO TABS
40.0000 mg | ORAL_TABLET | Freq: Every day | ORAL | Status: DC
  Administered 2018-08-06: 40 mg via ORAL
  Filled 2018-08-05: qty 1

## 2018-08-05 MED ORDER — METOPROLOL TARTRATE 5 MG/5ML IV SOLN
2.5000 mg | Freq: Once | INTRAVENOUS | Status: AC
  Administered 2018-08-05: 2.5 mg via INTRAVENOUS
  Filled 2018-08-05: qty 5

## 2018-08-05 NOTE — Care Management Note (Addendum)
Case Management Note Marvetta Gibbons RN, BSN Transitions of Care Unit 4E- RN Case Manager [phone]  Patient Details  Name: Gregory Bates MRN: [id number] Date of Birth: 28-Apr-1941  Subjective/Objective:  Pt admitted with [place], s/p CABGx3, with new dx of lung CA               Action/Plan: PTA pt lived at home with wife, per cardiac rehab pt requesting 3n1 and rollator for transition home. Will need to have [facility] deliver prior to discharge. Pt may also need home 02, however at this time has not qualified with ambulatory sats, CM to follow for home 02 needs. CM will follow for further transition of care needs. [facility] consulted for possible community f/u.   Expected Discharge Date:  07/26/18               Expected Discharge Plan:     In-House Referral:     Discharge planning Services  CM Consult  Post Acute Care Choice:  Durable Medical Equipment Choice offered to:  Patient  DME Arranged:  3-N-1, Walker rolling with seat DME Agency:  [place]:    [facility] Agency:     Status of Service:  In process, will continue to follow  If discussed at Long Length of Stay Meetings, dates discussed:    Discharge Disposition:   Additional Comments:  Dawayne Patricia, RN 08/05/2018, 3:43 PM

## 2018-08-05 NOTE — Consult Note (Signed)
   [facility] CM Inpatient Consult   08/05/2018  Gregory Bates Apr 10, 1941 [id number]    Patient screened for potential [facility] Care Management services due to unplanned readmission risk score of 30% (extreme).  Spoke with Gregory Bates at bedside about [place] Management services. He is agreeable and [facility] written consent obtained. [facility] folder provided.   Gregory Bates endorses his [facility] is primary. Also confirms his Primary Care Provider is Dr. Venetia Maxon [facility] is listed as doing transition of care calls).  Gregory Bates lives with his wife.  Denies having any concerns with transportation or with medications. Confirmed best contact number as [phone].  Gregory Bates is s/p CABG. Newly dx lung cancer. To have oncology f/u outpatient. Has a medical history of DM, CHF, CAD, HLD, HTN, PAF.  He endorses his brother-in-law just recently passed. Condolences offered.   Will make referral to [facility] for complex case management.  Made inpatient RNCM aware [facility] to follow post discharge.   Marthenia Rolling, MSN-Ed, RN,BSN [facility] Liaison [phone]

## 2018-08-05 NOTE — Plan of Care (Signed)
  Problem: Health Behavior/Discharge Planning: Goal: Ability to manage health-related needs will improve Outcome: Progressing   Problem: Clinical Measurements: Goal: Will remain free from infection Outcome: Progressing Goal: Respiratory complications will improve Outcome: Progressing   Problem: Pain Managment: Goal: General experience of comfort will improve Outcome: Progressing

## 2018-08-05 NOTE — Progress Notes (Addendum)
      [address]       [place] [address]             [phone]        4 Days Post-Op Procedure(s) (LRB): CORONARY ARTERY BYPASS GRAFTING (CABG) TIMES THREE USING LEFT INTERNAL MAMMARY ARTERY TO LAD, RIGHT GREATER SAPHENOUS VEIN GRAFT TO OM AND RCA, VEIN HARVESTED ENDOSCOPICALLY (N/A) TRANSESOPHAGEAL ECHOCARDIOGRAM (TEE) (N/A) CLIPPING OF ATRIAL APPENDAGE (Left) BIOPSY OF PERICARDIAL LYMPH NODE AND HILAR LYMPH NODE  Subjective: Patient a bit emotional this am. His brother in law (they were close) died from a stroke yesterday. The funeral is Saturday and he really wants to go home by then so he can go to it.  Objective: Vital signs in last 24 hours: Temp:  [97.7 F (36.5 C)-98.6 F (37 C)] 98.6 F (37 C) (10/04 0509) Pulse Rate:  [101-118] 111 (10/04 0509) Cardiac Rhythm: Atrial fibrillation (10/03 1843) Resp:  [14-23] 17 (10/04 0509) BP: (74-120)/(43-71) 105/58 (10/04 0509) SpO2:  [91 %-98 %] 94 % (10/04 0509) Weight:  [86.6 kg] 86.6 kg (10/04 0509)  Pre op weight 84.7 kg Current Weight  08/05/18 86.6 kg      Intake/Output from previous day: 10/03 0701 - 10/04 0700 In: 1275.3 [P.O.:1150; I.V.:125.3] Out: 300 [Urine:300]   Physical Exam:  Cardiovascular: IRRR Pulmonary: Slightly diminished at bases Abdomen: Soft, non tender, bowel sounds present. Extremities: Trace RLE edema. Wounds: Clean and dry.  No erythema or signs of infection.  Lab Results: CBC: Recent Labs    08/04/18 0624 08/05/18 0342  WBC 11.7* 13.0*  HGB 9.6* 9.6*  HCT 29.4* 29.7*  PLT 191 220   BMET:  Recent Labs    08/04/18 0624 08/05/18 0342  NA 128* 128*  K 4.2 4.1  CL 94* 93*  CO2 24 25  GLUCOSE 113* 109*  BUN 21 21  CREATININE 1.29* 1.50*  CALCIUM 8.3* 8.0*    PT/INR:  Lab Results  Component Value Date   INR 1.35 08/01/2018   INR 1.31 07/26/2018   INR 1.1 10/08/2017   ABG:  INR: Will add last result for INR, ABG once components are  confirmed Will add last 4 CBG results once components are confirmed  Assessment/Plan:  1. CV - A fib (has a history of PAF). On Amiodarone 400 mg bid and Lopressor 12.5 mg bid. Will increase Lopressor to 25 mg bid (starting tonight) and give bolus IV Lopressor this am as HR in the 120's. Will discuss with Dr. Servando Snare when to resume Apixaban. 2.  Pulmonary - On room air. CXR ordered but not taken yet. Encourage incentive spirometer.He will need PET as outpatient , rx with radiation and chemo for SCC lung cancer 3. Creatinine slightly increased to 1.5-on Lasix 40 mg daily. Will hold this am 4.  Acute blood loss anemia - H and H 9.6 and 29.7 5. Hyponatremia-sodium remains 128 this am. Possibly related to SIADH, SCC lung cancer. 6. CBGs 117/121/91. On Insulin. Pre op HGA1C 6.4  Gregory M ZimmermanPA-C 08/05/2018,7:09 AM [phone]  Resume Eliquis today  Wires out  [facility] home tomorrow  Pet scan as outpatient   I have seen and examined Gregory Bates and agree with the above assessment  and plan.  Grace Isaac MD Beeper [phone] Office [phone] 08/05/2018 9:48 AM

## 2018-08-05 NOTE — Anesthesia Postprocedure Evaluation (Signed)
Anesthesia Post Note  Patient: Gregory Bates  Procedure(s) Performed: CORONARY ARTERY BYPASS GRAFTING (CABG) TIMES THREE USING LEFT INTERNAL MAMMARY ARTERY TO LAD, RIGHT GREATER SAPHENOUS VEIN GRAFT TO OM AND RCA, VEIN HARVESTED ENDOSCOPICALLY (N/A Chest) TRANSESOPHAGEAL ECHOCARDIOGRAM (TEE) (N/A ) CLIPPING OF ATRIAL APPENDAGE (Left Chest) BIOPSY OF PERICARDIAL LYMPH NODE AND HILAR LYMPH NODE     Patient location during evaluation: SICU Anesthesia Type: General Level of consciousness: sedated Pain management: pain level controlled Vital Signs Assessment: post-procedure vital signs reviewed and stable Respiratory status: patient remains intubated per anesthesia plan Cardiovascular status: stable Postop Assessment: no apparent nausea or vomiting Anesthetic complications: no    Last Vitals:  Vitals:   08/05/18 0700 08/05/18 0854  BP:    Pulse:    Resp: 20   Temp:    SpO2:  94%    Last Pain:  Vitals:   08/05/18 0800  TempSrc:   PainSc: 0-No pain                 Abdel Effinger S

## 2018-08-05 NOTE — Progress Notes (Signed)
Progress Note  Patient Name: Gregory Bates Date of Encounter: 08/05/2018  Primary Cardiologist: Lauree Chandler, MD    Subjective   Wants to go home by Saturday Brother in law passed and funeral on Sunday Mucous and mild congestion   Inpatient Medications    Scheduled Meds: . amiodarone  400 mg Oral Q12H   Followed by  . [START ON 08/10/2018] amiodarone  400 mg Oral Daily  . aspirin EC  81 mg Oral Daily  . atorvastatin  20 mg Oral q1800  . docusate sodium  200 mg Oral Daily  . enoxaparin (LOVENOX) injection  30 mg Subcutaneous QHS  . fluticasone  2 spray Each Nare BID  . [START ON 08/06/2018] furosemide  40 mg Oral Daily  . guaiFENesin  600 mg Oral BID  . insulin aspart  0-24 Units Subcutaneous TID AC & HS  . insulin detemir  10 Units Subcutaneous Daily  . levalbuterol  0.63 mg Nebulization BID  . loratadine  10 mg Oral Daily  . mouth rinse  15 mL Mouth Rinse BID  . metoprolol tartrate  25 mg Oral BID  . mupirocin ointment  1 application Nasal BID  . pantoprazole  40 mg Oral QAC breakfast  . [START ON 08/06/2018] potassium chloride  10 mEq Oral Daily  . sodium chloride flush  3 mL Intravenous Q12H   Continuous Infusions: . sodium chloride     PRN Meds: sodium chloride, acetaminophen, alum & mag hydroxide-simeth, bisacodyl **OR** bisacodyl, ondansetron **OR** ondansetron (ZOFRAN) IV, oxyCODONE, sodium chloride flush, traMADol   Vital Signs    Vitals:   08/04/18 1951 08/04/18 2216 08/05/18 0008 08/05/18 0509  BP: 92/64 101/67 120/67 (!) 105/58  Pulse: (!) 111  (!) 112 (!) 111  Resp: 18 14 16 17   Temp: 98.6 F (37 C)  98.6 F (37 C) 98.6 F (37 C)  TempSrc: Oral  Oral Oral  SpO2: 96% 94% 92% 94%  Weight:    86.6 kg  Height:        Intake/Output Summary (Last 24 hours) at 08/05/2018 0841 Last data filed at 08/04/2018 2239 Gross per 24 hour  Intake 1122.75 ml  Output 300 ml  Net 822.75 ml   Filed Weights   08/02/18 0500 08/04/18 0500 08/05/18 0509    Weight: 88.1 kg 86.1 kg 86.6 kg    Telemetry    afib rates 100  - Personally Reviewed  ECG    afib old IMI  - Personally Reviewed  Physical Exam  Post sternotomy  GEN: No acute distress.   Neck: No JVD Cardiac: RRR, no murmurs, rubs, or gallops.  Respiratory: midl expiratory wheezing  GI: Soft, nontender, non-distended  MS: No edema; No deformity. Neuro:  Nonfocal  Psych: Normal affect   Labs    Chemistry Recent Labs  Lab 07/30/18 0303  08/03/18 0422 08/04/18 0624 08/05/18 0342  NA 124*   < > 127* 128* 128*  K 4.7   < > 4.2 4.2 4.1  CL 92*   < > 96* 94* 93*  CO2 22   < > 24 24 25   GLUCOSE 105*   < > 141* 113* 109*  BUN 12   < > 16 21 21   CREATININE 1.27*   < > 1.18 1.29* 1.50*  CALCIUM 8.9   < > 7.9* 8.3* 8.0*  PROT 6.4*  --   --   --  5.7*  ALBUMIN 3.8  --   --   --  2.9*  AST 39  --   --   --  24  ALT 50*  --   --   --  35  ALKPHOS 63  --   --   --  49  BILITOT 0.7  --   --   --  0.8  GFRNONAA 53*   < > 58* 52* 43*  GFRAA >60   < > >60 >60 50*  ANIONGAP 10   < > 7 10 10    < > = values in this interval not displayed.     Hematology Recent Labs  Lab 08/03/18 0422 08/04/18 0624 08/05/18 0342  WBC 15.1* 11.7* 13.0*  RBC 3.12* 3.18* 3.23*  HGB 9.2* 9.6* 9.6*  HCT 28.6* 29.4* 29.7*  MCV 91.7 92.5 92.0  MCH 29.5 30.2 29.7  MCHC 32.2 32.7 32.3  RDW 13.6 13.5 13.5  PLT 185 191 220    Cardiac Enzymes Recent Labs  Lab 07/30/18 0303 07/30/18 0918 07/30/18 1436  TROPONINI <0.03 <0.03 <0.03   No results for input(s): TROPIPOC in the last 168 hours.   BNPNo results for input(s): BNP, PROBNP in the last 168 hours.   DDimer No results for input(s): DDIMER in the last 168 hours.   Radiology    Dg Chest 2 View  Result Date: 08/05/2018 CLINICAL DATA:  CABG. EXAM: CHEST - 2 VIEW COMPARISON:  08/03/2018. FINDINGS: Interim removal of right IJ sheath. Prior CABG. Left atrial appendage clip noted in stable position. Stable cardiomegaly. Right upper lobe  atelectatic changes, progressed from prior exam. Small bilateral pleural effusions, progressed from prior exam. No pneumothorax. IMPRESSION: 1.  Interim removal of right IJ sheath.  No pneumothorax. 2. Prior CABG. Left atrial appendage clip in stable position. Stable cardiomegaly. 3. Progressive right upper lobe atelectasis. Slight progression of bilateral small pleural effusions. Electronically Signed   By: Marcello Moores  Register   On: 08/05/2018 08:23    Cardiac Studies   TTE EF 55-60% mild LAE  Patient Profile     77 y.o. male post CABG with LIMA to LAD, SVG to OM, SVG to PDA and LA clip. Also found To have lung cancer with right hilar mass  Assessment & Plan    1. CABG/CAD:  Mobilize diuresing pain control good  2. PAF:  Continue amiodarone and lopressor BP soft start eliquis today will need consideration for outpatient [facility] In 3 weeks if persists  3. Lung cancer. With right hilar mass will need oncology f/u   Will arrange outpatient f/u with CM     For questions or updates, please contact [place] Please consult [email or web address] for contact info under        Signed, Jenkins Rouge, MD  08/05/2018, 8:41 AM

## 2018-08-05 NOTE — Progress Notes (Signed)
CARDIAC REHAB PHASE I   PRE:  Rate/Rhythm: 113 Afib  BP:  Sitting: 119/75     SaO2: 94 RA  MODE:  Ambulation: 100 ft   POST:  Rate/Rhythm: 130 Afib  BP:  Sitting: 120/58    SaO2: 85? --> 95 2L  Pt ambulated 186ft in hallway assist of one with walker. Pt took 1 standing rest break. Pt very DOE, and had a hard time catching his breath. Pts ending O2 sats ranged from 75-95; pt placed on 2L [place], pt guided through purse lipped breathing, dyspnea improved. D/c education completed with pt and wife. Pt educated to shower daily and monitor incisions. Reinforced sternal precautions, and encouraged IS use. Pt given heart healthy diet. Reviewed restrictions and exercise guidelines. Will refer to CRP II [place]. Pt and wife requesting rollator, BSC, and O2. RN and CM made aware. Will continue to monitor.  [id number] Rufina Falco, RN BSN 08/05/2018 11:21 AM

## 2018-08-06 DIAGNOSIS — Z951 Presence of aortocoronary bypass graft: Secondary | ICD-10-CM

## 2018-08-06 LAB — GLUCOSE, CAPILLARY: GLUCOSE-CAPILLARY: 124 mg/dL — AB (ref 70–99)

## 2018-08-06 LAB — BASIC METABOLIC PANEL
ANION GAP: 10 (ref 5–15)
BUN: 18 mg/dL (ref 8–23)
CALCIUM: 8.5 mg/dL — AB (ref 8.9–10.3)
CO2: 23 mmol/L (ref 22–32)
Chloride: 94 mmol/L — ABNORMAL LOW (ref 98–111)
Creatinine, Ser: 1.38 mg/dL — ABNORMAL HIGH (ref 0.61–1.24)
GFR calc non Af Amer: 48 mL/min — ABNORMAL LOW (ref >60)
GFR, EST AFRICAN AMERICAN: 55 mL/min — AB (ref >60)
GLUCOSE: 117 mg/dL — AB (ref 70–99)
POTASSIUM: 4.1 mmol/L (ref 3.5–5.1)
SODIUM: 127 mmol/L — AB (ref 135–145)

## 2018-08-06 MED ORDER — TRAMADOL HCL 50 MG PO TABS: ORAL_TABLET | ORAL | 0 refills | Status: DC

## 2018-08-06 MED ORDER — FUROSEMIDE 20 MG PO TABS: 40.0000 mg | ORAL_TABLET | Freq: Every day | ORAL | 3 refills | Status: DC

## 2018-08-06 MED ORDER — POTASSIUM CHLORIDE CRYS ER 10 MEQ PO TBCR: 10.0000 meq | EXTENDED_RELEASE_TABLET | Freq: Every day | ORAL | 0 refills | Status: DC

## 2018-08-06 MED ORDER — AMIODARONE HCL 200 MG PO TABS: 200.0000 mg | ORAL_TABLET | Freq: Two times a day (BID) | ORAL | 3 refills | Status: DC

## 2018-08-06 MED ORDER — TOLVAPTAN 15 MG PO TABS
15.0000 mg | ORAL_TABLET | ORAL | Status: AC
  Administered 2018-08-06: 15 mg via ORAL
  Filled 2018-08-06: qty 1

## 2018-08-06 MED ORDER — GUAIFENESIN ER 600 MG PO TB12: 600.0000 mg | ORAL_TABLET | Freq: Two times a day (BID) | ORAL | Status: DC | PRN

## 2018-08-06 MED ORDER — ACETAMINOPHEN 325 MG PO TABS: 650.0000 mg | ORAL_TABLET | Freq: Four times a day (QID) | ORAL | Status: AC | PRN

## 2018-08-06 MED ORDER — ASPIRIN 81 MG PO TBEC: 81.0000 mg | DELAYED_RELEASE_TABLET | Freq: Every day | ORAL | Status: AC

## 2018-08-06 NOTE — Progress Notes (Signed)
D/c instructions given to pt and family. Prescriptions given to pt. IIncision care instructions given. RW and 3n1 delivered to room. CT sutures removed per order. IV removed clean and intact. Wife and grandson to escort home.  Clyde Canterbury, RN

## 2018-08-06 NOTE — Discharge Instructions (Signed)
Hyponatremia Hyponatremia is when the amount of salt (sodium) in your blood is too low. When salt levels are low, your cells absorb extra water and they swell. The swelling happens throughout the body, but it mostly affects the brain. Follow these instructions at home:  Take medicines only as told by your doctor. Many medicines can make this condition worse. Talk with your doctor about any medicines that you are currently taking.  Carefully follow a recommended diet as told by your doctor.  Carefully follow instructions from your doctor about fluid restrictions.  Keep all follow-up visits as told by your doctor. This is important.  Do not drink alcohol. Contact a doctor if:  You feel sicker to your stomach (nauseous).  You feel more confused.  You feel more tired (fatigued).  Your headache gets worse.  You feel weaker.  Your symptoms go away and then they come back.  You have trouble following the diet instructions. Get help right away if:  You start to twitch and shake (have a seizure).  You pass out (faint).  You keep having watery poop (diarrhea).  You keep throwing up (vomiting). This information is not intended to replace advice given to you by your health care provider. Make sure you discuss any questions you have with your health care provider. Document Released: 07/01/2011 Document Revised: 03/26/2016 Document Reviewed: 10/15/2014 Elsevier Interactive Patient Education  2018 [place].    Coronary Artery Bypass Grafting, Care After   This sheet gives you information about how to care for yourself after your procedure. Your health care provider may also give you more specific instructions. If you have problems or questions, contact your health care provider. What can I expect after the procedure? After the procedure, it is common to have:  Nausea and a lack of appetite.  Constipation.  Weakness and fatigue.  Depression or irritability.  Pain or  discomfort in your incision areas.  Follow these instructions at home: Medicines  Take over-the-counter and prescription medicines only as told by your health care provider. Do not stop taking medicines or start any new medicines without approval from your health care provider.  If you were prescribed an antibiotic medicine, take it as told by your health care provider. Do not stop taking the antibiotic even if you start to feel better.  Do not drive or use heavy machinery while taking prescription pain medicine. Incision care  Follow instructions from your health care provider about how to take care of your incisions. Make sure you: ? Wash your hands with soap and water before you change your bandage (dressing). If soap and water are not available, use hand sanitizer. ? Change your dressing as told by your health care provider. ? Leave stitches (sutures), skin glue, or adhesive strips in place. These skin closures may need to stay in place for 2 weeks or longer. If adhesive strip edges start to loosen and curl up, you may trim the loose edges. Do not remove adhesive strips completely unless your health care provider tells you to do that.  Keep incision areas clean, dry, and protected.  Check your incision areas every day for signs of infection. Check for: ? More redness, swelling, or pain. ? More fluid or blood. ? Warmth. ? Pus or a bad smell.  If incisions were made in your legs: ? Avoid crossing your legs. ? Avoid sitting for long periods of time. Change positions every 30 minutes. ? Raise (elevate) your legs when you are sitting. Bathing  Do not take baths, swim, or use a hot tub until your health care provider approves.  Only take sponge baths. Pat the incisions dry. Do not rub incisions with a washcloth or towel.  Ask your health care provider when you can shower. Eating and drinking  Eat foods that are high in fiber, such as raw fruits and vegetables, whole grains, beans,  and nuts. Meats should be lean cut. Avoid canned, processed, and fried foods. This can help prevent constipation and is a recommended part of a heart-healthy diet.  Drink enough fluid to keep your urine clear or pale yellow.  Limit alcohol intake to no more than 1 drink a day for nonpregnant women and 2 drinks a day for men. One drink equals 12 oz of beer, 5 oz of wine, or 1 oz of hard liquor. Activity  Rest and limit your activity as told by your health care provider. You may be instructed to: ? Stop any activity right away if you have chest pain, shortness of breath, irregular heartbeats, or dizziness. Get help right away if you have any of these symptoms. ? Move around frequently for short periods or take short walks as directed by your health care provider. Gradually increase your activities. You may need physical therapy or cardiac rehabilitation to help strengthen your muscles and build your endurance. ? Avoid lifting, pushing, or pulling anything that is heavier than 10 lb (4.5 kg) for at least 6 weeks or as told by your health care provider.  Do not drive until your health care provider approves.  Ask your health care provider when you may return to work.  Ask your health care provider when you may resume sexual activity. General instructions  Do not use any products that contain nicotine or tobacco, such as cigarettes and e-cigarettes. If you need help quitting, ask your health care provider.  Take 2-3 deep breaths every few hours during the day, while you recover. This helps expand your lungs and prevent complications like pneumonia after surgery.  If you were given a device called an incentive spirometer, use it several times a day to practice deep breathing. Support your chest with a pillow or your arms when you take deep breaths or cough.  Wear compression stockings as told by your health care provider. These stockings help to prevent blood clots and reduce swelling in your  legs.  Weigh yourself every day. This helps identify if your body is holding (retaining) fluid that may make your heart and lungs work harder.  Keep all follow-up visits as told by your health care provider. This is important. Contact a health care provider if:  You have more redness, swelling, or pain around any incision.  You have more fluid or blood coming from any incision.  Any incision feels warm to the touch.  You have pus or a bad smell coming from any incision  You have a fever.  You have swelling in your ankles or legs.  You have pain in your legs.  You gain 2 lb (0.9 kg) or more a day.  You are nauseous or you vomit.  You have diarrhea. Get help right away if:  You have chest pain that spreads to your jaw or arms.  You are short of breath.  You have a fast or irregular heartbeat.  You notice a "clicking" in your breastbone (sternum) when you move.  You have numbness or weakness in your arms or legs.  You feel dizzy or light-headed. Summary  After the procedure, it is common to have pain or discomfort in the incision areas.  Do not take baths, swim, or use a hot tub until your health care provider approves.  Gradually increase your activities. You may need physical therapy or cardiac rehabilitation to help strengthen your muscles and build your endurance.  Weigh yourself every day. This helps identify if your body is holding (retaining) fluid that may make your heart and lungs work harder. This information is not intended to replace advice given to you by your health care provider. Make sure you discuss any questions you have with your health care provider. Document Released: 05/08/2005 Document Revised: 09/07/2016 Document Reviewed: 09/07/2016 Elsevier Interactive Patient Education  [employer].

## 2018-08-06 NOTE — Progress Notes (Addendum)
      [address]       [place] [address]             [phone]        5 Days Post-Op Procedure(s) (LRB): CORONARY ARTERY BYPASS GRAFTING (CABG) TIMES THREE USING LEFT INTERNAL MAMMARY ARTERY TO LAD, RIGHT GREATER SAPHENOUS VEIN GRAFT TO OM AND RCA, VEIN HARVESTED ENDOSCOPICALLY (N/A) TRANSESOPHAGEAL ECHOCARDIOGRAM (TEE) (N/A) CLIPPING OF ATRIAL APPENDAGE (Left) BIOPSY OF PERICARDIAL LYMPH NODE AND HILAR LYMPH NODE  Subjective: Patient without specific complaints this am.  Objective: Vital signs in last 24 hours: Temp:  [97.9 F (36.6 C)-98.6 F (37 C)] 97.9 F (36.6 C) (10/04 2323) Pulse Rate:  [79-90] 79 (10/04 2323) Cardiac Rhythm: Normal sinus rhythm (10/04 2015) Resp:  [14-21] 19 (10/04 2323) BP: (105-128)/(52-65) 109/52 (10/04 2323) SpO2:  [93 %-97 %] 96 % (10/04 2323) Weight:  [86.7 kg] 86.7 kg (10/05 0300)  Pre op weight 84.7 kg Current Weight  08/06/18 86.7 kg      Intake/Output from previous day: 10/04 0701 - 10/05 0700 In: 240 [P.O.:240] Out: 500 [Urine:500]   Physical Exam:  Cardiovascular: RRR Pulmonary: Slightly diminished at bases Abdomen: Soft, non tender, bowel sounds present. Extremities: Trace RLE edema. Wounds: Clean and dry.  No erythema or signs of infection.  Lab Results: CBC: Recent Labs    08/04/18 0624 08/05/18 0342  WBC 11.7* 13.0*  HGB 9.6* 9.6*  HCT 29.4* 29.7*  PLT 191 220   BMET:  Recent Labs    08/05/18 0342 08/06/18 0303  NA 128* 127*  K 4.1 4.1  CL 93* 94*  CO2 25 23  GLUCOSE 109* 117*  BUN 21 18  CREATININE 1.50* 1.38*  CALCIUM 8.0* 8.5*    PT/INR:  Lab Results  Component Value Date   INR 1.35 08/01/2018   INR 1.31 07/26/2018   INR 1.1 10/08/2017   ABG:  INR: Will add last result for INR, ABG once components are confirmed Will add last 4 CBG results once components are confirmed  Assessment/Plan:  1. CV - A fib (has a history of PAF). SR this am. On Amiodarone 400 mg bid  and Lopressor 25 mg bid, and Apixaban. 2.  Pulmonary - On room air. Encourage incentive spirometer.He will need PET as outpatient , rx with radiation and chemo for SCC lung cancer 3. Creatinine slightly decreased to 1.38 as Lasix held yesterday. Will give Lasix for a few days at discharge then stop. 4.  Acute blood loss anemia - Last H and H 9.6 and 29.7 5. Hyponatremia-sodium remains 127 this am. Possibly related to SIADH, SCC lung cancer. As discussed with Dr. Cyndia Bent, give one dose of Tolvaptan, fluid restrict after discharge, and check BMET next Tuesday. According to patient, he has had low sodium in the past and medical doctor put him on a "salt pill". He will follow up with his medical doctor as well 6. CBGs 138/137/124. On Insulin. Pre op HGA1C 6.4 7. Remove sutures 8. Discharge  Donielle M ZimmermanPA-C 08/06/2018,7:13 AM [phone]

## 2018-08-06 NOTE — Progress Notes (Addendum)
Requested Reggie w [facility] to deliver Rollator and 3/1 to room.

## 2018-08-08 ENCOUNTER — Other Ambulatory Visit: Payer: Self-pay | Admitting: *Deleted

## 2018-08-08 ENCOUNTER — Other Ambulatory Visit: Payer: Self-pay

## 2018-08-08 DIAGNOSIS — R911 Solitary pulmonary nodule: Secondary | ICD-10-CM

## 2018-08-08 DIAGNOSIS — R918 Other nonspecific abnormal finding of lung field: Secondary | ICD-10-CM

## 2018-08-08 NOTE — Patient Outreach (Signed)
Telephone assessment:  New referral:  MD Office does Transition of care:  Placed call to patient who identified himself and states that he is very short of breath.  Patient request I speak with Merrily Pew, his grandson.   Grandson reports that patient has increased shortness of breath.Yolanda Bonine reports that he called MD office and could not get an appointment today. Grandson voiced that patient has all his medications. Yolanda Bonine states that patient needs oxygen. I reviewed Epic and it is noted that patient did not qualify for oxygen prior to discharge.  Reviewed with grandson.   I placed call to [facility] and requested a urgent MD visit.  I was told that Dr. Venetia Maxon was informed and stated for patient to go to [facility] ED.   PLAN: Placed call back to patient and relayed message for patient to return to the emergency department.   Unable to complete care planning due to patients urgent medical need.  Will follow up in 24 hours.  Tomasa Rand, RN, BSN, CEN [facility] [employer] [phone]

## 2018-08-09 ENCOUNTER — Inpatient Hospital Stay (HOSPITAL_COMMUNITY)
Admission: EM | Admit: 2018-08-09 | Discharge: 2018-08-10 | DRG: 189 | Disposition: A | Discharge status: Home / Self Care | Payer: BLUE CROSS/BLUE SHIELD | Source: Other Acute Inpatient Hospital | Attending: Emergency Medicine | Admitting: Emergency Medicine

## 2018-08-09 ENCOUNTER — Encounter: Payer: Self-pay | Admitting: *Deleted

## 2018-08-09 ENCOUNTER — Observation Stay (HOSPITAL_COMMUNITY): Payer: BLUE CROSS/BLUE SHIELD

## 2018-08-09 ENCOUNTER — Encounter (HOSPITAL_COMMUNITY): Payer: Self-pay | Admitting: Internal Medicine

## 2018-08-09 ENCOUNTER — Inpatient Hospital Stay (HOSPITAL_COMMUNITY): Payer: BLUE CROSS/BLUE SHIELD

## 2018-08-09 ENCOUNTER — Other Ambulatory Visit: Payer: Self-pay

## 2018-08-09 DIAGNOSIS — R569 Unspecified convulsions: Secondary | ICD-10-CM

## 2018-08-09 DIAGNOSIS — J984 Other disorders of lung: Secondary | ICD-10-CM | POA: Diagnosis not present

## 2018-08-09 DIAGNOSIS — C3411 Malignant neoplasm of upper lobe, right bronchus or lung: Secondary | ICD-10-CM | POA: Diagnosis not present

## 2018-08-09 DIAGNOSIS — Z7984 Long term (current) use of oral hypoglycemic drugs: Secondary | ICD-10-CM | POA: Diagnosis not present

## 2018-08-09 DIAGNOSIS — C3490 Malignant neoplasm of unspecified part of unspecified bronchus or lung: Secondary | ICD-10-CM

## 2018-08-09 DIAGNOSIS — Z87891 Personal history of nicotine dependence: Secondary | ICD-10-CM | POA: Diagnosis not present

## 2018-08-09 DIAGNOSIS — I130 Hypertensive heart and chronic kidney disease with heart failure and stage 1 through stage 4 chronic kidney disease, or unspecified chronic kidney disease: Secondary | ICD-10-CM | POA: Diagnosis present

## 2018-08-09 DIAGNOSIS — N183 Chronic kidney disease, stage 3 unspecified: Secondary | ICD-10-CM | POA: Diagnosis present

## 2018-08-09 DIAGNOSIS — Z7982 Long term (current) use of aspirin: Secondary | ICD-10-CM | POA: Diagnosis not present

## 2018-08-09 DIAGNOSIS — Z7901 Long term (current) use of anticoagulants: Secondary | ICD-10-CM | POA: Diagnosis not present

## 2018-08-09 DIAGNOSIS — I252 Old myocardial infarction: Secondary | ICD-10-CM | POA: Diagnosis not present

## 2018-08-09 DIAGNOSIS — Z8249 Family history of ischemic heart disease and other diseases of the circulatory system: Secondary | ICD-10-CM | POA: Diagnosis not present

## 2018-08-09 DIAGNOSIS — E871 Hypo-osmolality and hyponatremia: Secondary | ICD-10-CM | POA: Diagnosis not present

## 2018-08-09 DIAGNOSIS — I959 Hypotension, unspecified: Secondary | ICD-10-CM | POA: Diagnosis present

## 2018-08-09 DIAGNOSIS — D62 Acute posthemorrhagic anemia: Secondary | ICD-10-CM | POA: Diagnosis not present

## 2018-08-09 DIAGNOSIS — I110 Hypertensive heart disease with heart failure: Secondary | ICD-10-CM | POA: Diagnosis not present

## 2018-08-09 DIAGNOSIS — I255 Ischemic cardiomyopathy: Secondary | ICD-10-CM | POA: Diagnosis present

## 2018-08-09 DIAGNOSIS — R0603 Acute respiratory distress: Secondary | ICD-10-CM | POA: Diagnosis not present

## 2018-08-09 DIAGNOSIS — E1122 Type 2 diabetes mellitus with diabetic chronic kidney disease: Secondary | ICD-10-CM | POA: Diagnosis present

## 2018-08-09 DIAGNOSIS — Z955 Presence of coronary angioplasty implant and graft: Secondary | ICD-10-CM | POA: Diagnosis not present

## 2018-08-09 DIAGNOSIS — Z5111 Encounter for antineoplastic chemotherapy: Secondary | ICD-10-CM | POA: Diagnosis not present

## 2018-08-09 DIAGNOSIS — E785 Hyperlipidemia, unspecified: Secondary | ICD-10-CM | POA: Diagnosis present

## 2018-08-09 DIAGNOSIS — J90 Pleural effusion, not elsewhere classified: Secondary | ICD-10-CM | POA: Diagnosis not present

## 2018-08-09 DIAGNOSIS — Z79899 Other long term (current) drug therapy: Secondary | ICD-10-CM

## 2018-08-09 DIAGNOSIS — I2510 Atherosclerotic heart disease of native coronary artery without angina pectoris: Secondary | ICD-10-CM | POA: Diagnosis present

## 2018-08-09 DIAGNOSIS — D72829 Elevated white blood cell count, unspecified: Secondary | ICD-10-CM | POA: Diagnosis not present

## 2018-08-09 DIAGNOSIS — I5032 Chronic diastolic (congestive) heart failure: Secondary | ICD-10-CM | POA: Diagnosis present

## 2018-08-09 DIAGNOSIS — R079 Chest pain, unspecified: Secondary | ICD-10-CM | POA: Diagnosis not present

## 2018-08-09 DIAGNOSIS — N2889 Other specified disorders of kidney and ureter: Secondary | ICD-10-CM | POA: Diagnosis not present

## 2018-08-09 DIAGNOSIS — Z951 Presence of aortocoronary bypass graft: Secondary | ICD-10-CM | POA: Diagnosis not present

## 2018-08-09 DIAGNOSIS — I5033 Acute on chronic diastolic (congestive) heart failure: Secondary | ICD-10-CM | POA: Diagnosis not present

## 2018-08-09 DIAGNOSIS — J9621 Acute and chronic respiratory failure with hypoxia: Secondary | ICD-10-CM | POA: Diagnosis not present

## 2018-08-09 DIAGNOSIS — E1159 Type 2 diabetes mellitus with other circulatory complications: Secondary | ICD-10-CM | POA: Diagnosis present

## 2018-08-09 DIAGNOSIS — R05 Cough: Secondary | ICD-10-CM | POA: Diagnosis not present

## 2018-08-09 DIAGNOSIS — I5042 Chronic combined systolic (congestive) and diastolic (congestive) heart failure: Secondary | ICD-10-CM | POA: Diagnosis not present

## 2018-08-09 DIAGNOSIS — J9601 Acute respiratory failure with hypoxia: Secondary | ICD-10-CM | POA: Diagnosis present

## 2018-08-09 DIAGNOSIS — N189 Chronic kidney disease, unspecified: Secondary | ICD-10-CM | POA: Diagnosis not present

## 2018-08-09 DIAGNOSIS — I480 Paroxysmal atrial fibrillation: Secondary | ICD-10-CM | POA: Diagnosis present

## 2018-08-09 DIAGNOSIS — R0602 Shortness of breath: Secondary | ICD-10-CM

## 2018-08-09 DIAGNOSIS — C3491 Malignant neoplasm of unspecified part of right bronchus or lung: Secondary | ICD-10-CM | POA: Diagnosis not present

## 2018-08-09 LAB — GLUCOSE, CAPILLARY
GLUCOSE-CAPILLARY: 140 mg/dL — AB (ref 70–99)
GLUCOSE-CAPILLARY: 116 mg/dL — AB (ref 70–99)
Glucose-Capillary: 116 mg/dL — ABNORMAL HIGH (ref 70–99)
Glucose-Capillary: 120 mg/dL — ABNORMAL HIGH (ref 70–99)
Glucose-Capillary: 123 mg/dL — ABNORMAL HIGH (ref 70–99)
Glucose-Capillary: 130 mg/dL — ABNORMAL HIGH (ref 70–99)

## 2018-08-09 LAB — CBC WITH DIFFERENTIAL/PLATELET
ABS IMMATURE GRANULOCYTES: 0.1 10*3/uL — AB (ref 0.00–0.07)
BASOS PCT: 1 %
Basophils Absolute: 0.1 10*3/uL (ref 0.0–0.1)
Eosinophils Absolute: 0.2 10*3/uL (ref 0.0–0.5)
Eosinophils Relative: 2 %
HCT: 27.1 % — ABNORMAL LOW (ref 39.0–52.0)
Hemoglobin: 8.5 g/dL — ABNORMAL LOW (ref 13.0–17.0)
IMMATURE GRANULOCYTES: 1 %
LYMPHS PCT: 7 %
Lymphs Abs: 0.7 10*3/uL (ref 0.7–4.0)
MCH: 28.6 pg (ref 26.0–34.0)
MCHC: 31.4 g/dL (ref 30.0–36.0)
MCV: 91.2 fL (ref 80.0–100.0)
MONOS PCT: 12 %
Monocytes Absolute: 1.1 10*3/uL — ABNORMAL HIGH (ref 0.1–1.0)
NEUTROS ABS: 7.2 10*3/uL (ref 1.7–7.7)
NEUTROS PCT: 77 %
PLATELETS: 367 10*3/uL (ref 150–400)
RBC: 2.97 MIL/uL — ABNORMAL LOW (ref 4.22–5.81)
RDW: 13.7 % (ref 11.5–15.5)
WBC: 9.3 10*3/uL (ref 4.0–10.5)

## 2018-08-09 LAB — COMPREHENSIVE METABOLIC PANEL
ALK PHOS: 52 U/L (ref 38–126)
ALT: 29 U/L (ref 0–44)
ANION GAP: 10 (ref 5–15)
AST: 21 U/L (ref 15–41)
Albumin: 2.7 g/dL — ABNORMAL LOW (ref 3.5–5.0)
BUN: 23 mg/dL (ref 8–23)
CALCIUM: 8 mg/dL — AB (ref 8.9–10.3)
CHLORIDE: 98 mmol/L (ref 98–111)
CO2: 25 mmol/L (ref 22–32)
Creatinine, Ser: 1.87 mg/dL — ABNORMAL HIGH (ref 0.61–1.24)
GFR calc non Af Amer: 33 mL/min — ABNORMAL LOW (ref >60)
GFR, EST AFRICAN AMERICAN: 38 mL/min — AB (ref >60)
Glucose, Bld: 131 mg/dL — ABNORMAL HIGH (ref 70–99)
POTASSIUM: 4 mmol/L (ref 3.5–5.1)
SODIUM: 133 mmol/L — AB (ref 135–145)
Total Bilirubin: 0.8 mg/dL (ref 0.3–1.2)
Total Protein: 5.2 g/dL — ABNORMAL LOW (ref 6.5–8.1)

## 2018-08-09 LAB — TROPONIN I
TROPONIN I: 0.38 ng/mL — AB (ref <0.03)
Troponin I: 0.35 ng/mL (ref <0.03)
Troponin I: 0.37 ng/mL (ref <0.03)

## 2018-08-09 LAB — TYPE AND SCREEN
ABO/RH(D): B POS
Antibody Screen: NEGATIVE

## 2018-08-09 LAB — LACTIC ACID, PLASMA
Lactic Acid, Venous: 1.2 mmol/L (ref 0.5–1.9)
Lactic Acid, Venous: 1.1 mmol/L (ref 0.5–1.9)

## 2018-08-09 LAB — BRAIN NATRIURETIC PEPTIDE: B NATRIURETIC PEPTIDE 5: 507.8 pg/mL — AB (ref 0.0–100.0)

## 2018-08-09 LAB — MAGNESIUM: Magnesium: 1.9 mg/dL (ref 1.7–2.4)

## 2018-08-09 LAB — AMMONIA: Ammonia: 17 umol/L (ref 9–35)

## 2018-08-09 MED ORDER — INSULIN ASPART 100 UNIT/ML ~~LOC~~ SOLN
0.0000 [IU] | Freq: Three times a day (TID) | SUBCUTANEOUS | Status: DC
  Administered 2018-08-10: 1 [IU] via SUBCUTANEOUS

## 2018-08-09 MED ORDER — LEVETIRACETAM IN NACL 1500 MG/100ML IV SOLN
1500.0000 mg | Freq: Once | INTRAVENOUS | Status: AC
  Administered 2018-08-09: 1500 mg via INTRAVENOUS
  Filled 2018-08-09: qty 100

## 2018-08-09 MED ORDER — AMIODARONE HCL 200 MG PO TABS
200.0000 mg | ORAL_TABLET | Freq: Two times a day (BID) | ORAL | Status: DC
  Administered 2018-08-09 – 2018-08-10 (×3): 200 mg via ORAL
  Filled 2018-08-09 (×3): qty 1

## 2018-08-09 MED ORDER — MORPHINE SULFATE (PF) 2 MG/ML IV SOLN
INTRAVENOUS | Status: AC
  Filled 2018-08-09: qty 1

## 2018-08-09 MED ORDER — LEVALBUTEROL HCL 0.63 MG/3ML IN NEBU
0.6300 mg | INHALATION_SOLUTION | Freq: Four times a day (QID) | RESPIRATORY_TRACT | Status: DC | PRN
  Administered 2018-08-09: 0.63 mg via RESPIRATORY_TRACT
  Filled 2018-08-09: qty 3

## 2018-08-09 MED ORDER — LEVETIRACETAM 250 MG PO TABS
500.0000 mg | ORAL_TABLET | Freq: Two times a day (BID) | ORAL | Status: DC
  Administered 2018-08-09 – 2018-08-10 (×3): 500 mg via ORAL
  Filled 2018-08-09 (×3): qty 2

## 2018-08-09 MED ORDER — GUAIFENESIN ER 600 MG PO TB12
600.0000 mg | ORAL_TABLET | Freq: Two times a day (BID) | ORAL | Status: DC | PRN
  Administered 2018-08-09: 600 mg via ORAL
  Filled 2018-08-09: qty 1

## 2018-08-09 MED ORDER — MORPHINE SULFATE (PF) 2 MG/ML IV SOLN
1.0000 mg | INTRAVENOUS | Status: DC | PRN
  Administered 2018-08-09 (×2): 1 mg via INTRAVENOUS
  Filled 2018-08-09 (×2): qty 1

## 2018-08-09 MED ORDER — METOPROLOL TARTRATE 25 MG PO TABS
25.0000 mg | ORAL_TABLET | Freq: Two times a day (BID) | ORAL | Status: DC
  Administered 2018-08-09 – 2018-08-10 (×3): 25 mg via ORAL
  Filled 2018-08-09 (×3): qty 1

## 2018-08-09 MED ORDER — ACETAMINOPHEN 325 MG PO TABS: 650.0000 mg | ORAL_TABLET | Freq: Four times a day (QID) | ORAL | Status: DC | PRN

## 2018-08-09 MED ORDER — ATORVASTATIN CALCIUM 20 MG PO TABS
20.0000 mg | ORAL_TABLET | Freq: Every day | ORAL | Status: DC
  Administered 2018-08-09 – 2018-08-10 (×2): 20 mg via ORAL
  Filled 2018-08-09 (×2): qty 1

## 2018-08-09 MED ORDER — ONDANSETRON HCL 4 MG PO TABS: 4.0000 mg | ORAL_TABLET | Freq: Four times a day (QID) | ORAL | Status: DC | PRN

## 2018-08-09 MED ORDER — HYDROCOD POLST-CPM POLST ER 10-8 MG/5ML PO SUER
5.0000 mL | Freq: Two times a day (BID) | ORAL | Status: DC | PRN
  Administered 2018-08-09 (×2): 5 mL via ORAL
  Filled 2018-08-09 (×2): qty 5

## 2018-08-09 MED ORDER — FUROSEMIDE 10 MG/ML IJ SOLN
40.0000 mg | Freq: Once | INTRAMUSCULAR | Status: AC
  Administered 2018-08-09: 40 mg via INTRAVENOUS
  Filled 2018-08-09: qty 4

## 2018-08-09 MED ORDER — ACETAMINOPHEN 650 MG RE SUPP: 650.0000 mg | Freq: Four times a day (QID) | RECTAL | Status: DC | PRN

## 2018-08-09 MED ORDER — MORPHINE SULFATE (PF) 2 MG/ML IV SOLN
1.0000 mg | Freq: Once | INTRAVENOUS | Status: AC
  Administered 2018-08-09: 1 mg via INTRAVENOUS

## 2018-08-09 MED ORDER — GADOBUTROL 1 MMOL/ML IV SOLN
10.0000 mL | Freq: Once | INTRAVENOUS | Status: AC | PRN
  Administered 2018-08-09: 8 mL via INTRAVENOUS

## 2018-08-09 MED ORDER — INSULIN ASPART 100 UNIT/ML ~~LOC~~ SOLN: 0.0000 [IU] | SUBCUTANEOUS | Status: DC

## 2018-08-09 MED ORDER — ONDANSETRON HCL 4 MG/2ML IJ SOLN: 4.0000 mg | Freq: Four times a day (QID) | INTRAMUSCULAR | Status: DC | PRN

## 2018-08-09 NOTE — Progress Notes (Signed)
Patient started complaining of right upper side pain that he rates at 10. Pain is severe and yelling out. RRT called and MD in to [place]

## 2018-08-09 NOTE — Progress Notes (Signed)
Patient had another episode of seizure like activity witnessed by grand on. MD notified. MRI ordered.

## 2018-08-09 NOTE — Consult Note (Signed)
   [facility] CM Inpatient Consult   08/09/2018  Gregory Bates October 25, 1941 [id number]   Alerted this morning of patient's readmission by [facility].  Patient just recently with [facility] for community complex disease management program. Will follow for progress. Will follow up with inpatient RNCM [not currently available], that patient was recently referred to [facility] program.  Natividad Brood, RN BSN [facility] Liaison  [phone] business mobile phone Toll free office [phone]

## 2018-08-09 NOTE — Progress Notes (Addendum)
Same-day pre-follow-up note Patient seen and examined No further spells noted but some subtle generalized body twitching with preserved consciousness. Patient is awake alert oriented with mildly dysarthric speech without evidence of aphasia. Able to follow commands Pupils equal round react light, no visual field cuts, symmetric face Antigravity in all 4 extremities Intact sensation all over  77 year old man with past history of unstable angina status post CABG and newly developed right upper lobe atelectasis due to lung cancer was referred for neurological consultation when he had some spells that were concerning for seizures.  The spells have been in the setting of coughing and had preserved consciousness. There is low suspicion for seizures but he has a history of an aggressive lung cancer that has been metastatic potential and hence prophylactically started on AEDs  Impression Seizure-like activity Some twitching might be asterixis which is negative myoclonus.  Recommendations MRI brain with and without contrast Routine EEG completed.  Report pending. Continue Keppra empirically for now Correction of toxic metabolic derangements including deranged renal function.  Some of the subtle jerking movements could be asterixis. We will follow with you   -- Amie Portland, MD [facility] Pager: [phone] If 7pm to 7am, please call on call as listed on AMION.

## 2018-08-09 NOTE — Progress Notes (Signed)
Bedside EEG complete; results pending.

## 2018-08-09 NOTE — Consult Note (Signed)
Requesting Physician: Dr. Hal Hope    Chief Complaint: Jerking  History obtained from: Patient and Chart    HPI:                                                                                                                                       Gregory Bates is an 77 y.o. male past medical history for unstable angina status post CABG and newly developed right upper lobe atelectasis due to lung cancer.  Patient was brought to [facility] emergency room after grandson noted patient to have coughing spells followed by some nausea shortness of breath and then generalized posturing and shaking for about 1 minute without loss of consciousness.  Patient transferred to [facility] subsequently has had 3 spells in the hospital and received Keppra 1.5 g. However he is still continues to have the spells.  They start after a coughing fit, then patient turned cyanotic  over his face, has tonic posturing of both upper extremities lasting for about 30seconds to 2 minutes.  Patient is aware of these spells and does not have complete loss of consciousness.     Past Medical History:  Diagnosis Date  . Acute renal insufficiency    a. During 11/2013 adm with atrial flutter.  . Anemia, unspecified   . CAD (coronary artery disease), native coronary artery    a. s/p cath May 2011 >> DES distal RCA;  b. 01/2013 NSTEMI >> OM1 99p (2.75x12 Promus Premier DES);  c. LHC (2/15): PCI: Promus DES to dist RCA ;  d. LHC 1/16 - dLM 60, oLAD 30, small D1 tandem 103, pOM1 stent ok, mOM 50-60, dOM bifurcation 50-60 in both vessels, AV 44-70 jailed by stent, pRCA 81 mRCA stent ok, dRCA stent ok, PLB smal 50, dPDA 60, 80; EF 35-40 >> med Rx - PCI of PDA if angina  . Chronic combined systolic and diastolic CHF (congestive heart failure) ([place]) 12/23/2015   Echo 2/17: Mild LVH, EF 45-50%, inferior akinesis, grade 2 diastolic dysfunction, mild AI, mild MR, mild LAE, PASP 44 mmHg   . Colorectal polyps 2002  . Diabetes mellitus     AODM  . Erectile dysfunction   . History of echocardiogram    a. 01/2013 Echo: EF 55%, mid-dist inflat HK, Gr1 DD, Triv AI/TR, Mild MR.;  b.  Echo (1/15): Mild LVH, EF 55%, inferolateral hypokinesis, grade 1 diastolic dysfunction, mild AI, trivial MR, moderate LAE, PASP 36 mmHg   . Hx of cardiovascular stress test    Stress myoview 08/07/13 with LVEF 44%, inferior scar, no ischemia  . Hyperlipidemia   . Hypertension   . Ischemic cardiomyopathy    a. EF 35-40% by LHC in 1/16; b. Echo 2/17: Mild LVH, EF 45-50%, inferior akinesis, grade 2 diastolic dysfunction, mild AI, mild MR, mild LAE, PASP 44 mmHg  . Myocardial infarction ([place])   . PAF (paroxysmal atrial fibrillation) ([place])  a. Dx 11/2013. Spont conv. Placed on eliquis.  . Small cell lung cancer in adult [facility]) 07/28/2018    Past Surgical History:  Procedure Laterality Date  . BIOPSY  08/01/2018   Procedure: BIOPSY OF PERICARDIAL LYMPH NODE AND HILAR LYMPH NODE;  Surgeon: Grace Isaac, MD;  Location: [place];  Service: Open Heart Surgery;;  . CLIPPING OF ATRIAL APPENDAGE Left 08/01/2018   Procedure: CLIPPING OF ATRIAL APPENDAGE;  Surgeon: Grace Isaac, MD;  Location: [place];  Service: Open Heart Surgery;  Laterality: Left;  . COLONOSCOPY W/ POLYPECTOMY  2002   [facility]  . COLONOSCOPY W/ POLYPECTOMY  2004; 2007 negative  . CORONARY ANGIOPLASTY  12/26/2013  . CORONARY ARTERY BYPASS GRAFT N/A 08/01/2018   Procedure: CORONARY ARTERY BYPASS GRAFTING (CABG) TIMES THREE USING LEFT INTERNAL MAMMARY ARTERY TO LAD, RIGHT GREATER SAPHENOUS VEIN GRAFT TO OM AND RCA, VEIN HARVESTED ENDOSCOPICALLY;  Surgeon: Grace Isaac, MD;  Location: [place];  Service: Open Heart Surgery;  Laterality: N/A;  . CORONARY STENT PLACEMENT  2007, 2011  . infantile paralysis facial asymmetry    . LEFT HEART CATH AND CORONARY ANGIOGRAPHY N/A 05/18/2017   Procedure: Left Heart Cath and Coronary Angiography;  Surgeon: Martinique, Peter M, MD;  Location: [place]  CV LAB;  Service: Cardiovascular;  Laterality: N/A;  . LEFT HEART CATH AND CORONARY ANGIOGRAPHY N/A 07/27/2018   Procedure: LEFT HEART CATH AND CORONARY ANGIOGRAPHY;  Surgeon: Martinique, Peter M, MD;  Location: [place] CV LAB;  Service: Cardiovascular;  Laterality: N/A;  . LEFT HEART CATHETERIZATION WITH CORONARY ANGIOGRAM N/A 10/02/2012   Procedure: LEFT HEART CATHETERIZATION WITH CORONARY ANGIOGRAM;  Surgeon: Peter M Martinique, MD;  Location: [facility] CATH LAB;  Service: Cardiovascular;  Laterality: N/A;  . LEFT HEART CATHETERIZATION WITH CORONARY ANGIOGRAM N/A 02/20/2013   Procedure: LEFT HEART CATHETERIZATION WITH CORONARY ANGIOGRAM;  Surgeon: Burnell Blanks, MD;  Location: [facility] CATH LAB;  Service: Cardiovascular;  Laterality: N/A;  . LEFT HEART CATHETERIZATION WITH CORONARY ANGIOGRAM N/A 12/26/2013   Procedure: LEFT HEART CATHETERIZATION WITH CORONARY ANGIOGRAM;  Surgeon: Burnell Blanks, MD;  Location: [facility] CATH LAB;  Service: Cardiovascular;  Laterality: N/A;  . LEFT HEART CATHETERIZATION WITH CORONARY ANGIOGRAM N/A 11/28/2014   Procedure: LEFT HEART CATHETERIZATION WITH CORONARY ANGIOGRAM;  Surgeon: Burnell Blanks, MD;  Location: [facility] CATH LAB;  Service: Cardiovascular;  Laterality: N/A;  . PERCUTANEOUS CORONARY STENT INTERVENTION (PCI-S)  10/02/2012   Procedure: PERCUTANEOUS CORONARY STENT INTERVENTION (PCI-S);  Surgeon: Peter M Martinique, MD;  Location: [facility] CATH LAB;  Service: Cardiovascular;;  . PERCUTANEOUS CORONARY STENT INTERVENTION (PCI-S)  02/20/2013   Procedure: PERCUTANEOUS CORONARY STENT INTERVENTION (PCI-S);  Surgeon: Burnell Blanks, MD;  Location: [facility] CATH LAB;  Service: Cardiovascular;;  . PERCUTANEOUS CORONARY STENT INTERVENTION (PCI-S)  12/26/2013   Procedure: PERCUTANEOUS CORONARY STENT INTERVENTION (PCI-S);  Surgeon: Burnell Blanks, MD;  Location: [facility] CATH LAB;  Service: Cardiovascular;;  . TEE WITHOUT CARDIOVERSION N/A 08/01/2018   Procedure: TRANSESOPHAGEAL  ECHOCARDIOGRAM (TEE);  Surgeon: Grace Isaac, MD;  Location: [place];  Service: Open Heart Surgery;  Laterality: N/A;  . WISDOM TOOTH EXTRACTION      Family History  Problem Relation Age of Onset  . Heart attack Brother 57  . Colon cancer Brother   . Diabetes Neg Hx   . Stroke Neg Hx   . Hypertension Neg Hx    Social History:  reports that he quit smoking about 33 years ago. He has never used  smokeless tobacco. He reports that he does not drink alcohol or use drugs.  Allergies:  Allergies  Allergen Reactions  . Brilinta [Ticagrelor] Other (See Comments)    Arthralgias & myalgias  . Crestor [Rosuvastatin] Other (See Comments)    Myalgias    Medications:                                                                                                                        I reviewed home medications   ROS:                                                                                                                                     14 systems reviewed and negative except above    Examination:                                                                                                      General: Appears well-developed and well-nourished.  Psych: Affect appropriate to situation Eyes: No scleral injection HENT: No OP obstrucion Head: Normocephalic.  Cardiovascular: Normal rate and regular rhythm.  Respiratory: no crackles, not in respiratory distress GI: Soft.  No distension. There is no tenderness.  Skin: WDI    Neurological Examination Mental Status: Alert, oriented, thought content appropriate.  Speech fluent without evidence of aphasia. Able to follow 3 step commands without difficulty. Cranial Nerves: II: Visual fields grossly normal,  III,IV, VI: ptosis not present, extra-ocular motions intact bilaterally, pupils equal, round, reactive to light and accommodation V,VII: smile symmetric, facial light touch sensation normal bilaterally VIII: hearing  normal bilaterally IX,X: uvula rises symmetrically XI: bilateral shoulder shrug XII: midline tongue extension Motor: Right : Upper extremity   5/5    Left:     Upper extremity   5/5  Lower extremity   5/5     Lower extremity   5/5 Tone and bulk:normal tone throughout; no atrophy noted Sensory: Pinprick and light touch intact throughout, bilaterally Deep Tendon Reflexes: 2+  and symmetric throughout Plantars: Right: downgoing   Left: downgoing Cerebellar: normal finger-to-nose, normal rapid alternating movements and normal heel-to-shin test Gait: normal gait and station     Lab Results: Basic Metabolic Panel: Recent Labs  Lab 08/02/18 1638  08/03/18 0422 08/04/18 0624 08/05/18 0342 08/06/18 0303 08/09/18 0255  NA  --   --  127* 128* 128* 127* 133*  K  --   --  4.2 4.2 4.1 4.1 4.0  CL  --   --  96* 94* 93* 94* 98  CO2  --   --  24 24 25 23 25   GLUCOSE  --   --  141* 113* 109* 117* 131*  BUN  --   --  16 21 21 18 23   CREATININE 1.37*  --  1.18 1.29* 1.50* 1.38* 1.87*  CALCIUM  --    < > 7.9* 8.3* 8.0* 8.5* 8.0*  MG 2.5*  --  2.1  --   --   --   --    < > = values in this interval not displayed.    CBC: Recent Labs  Lab 08/02/18 1638 08/03/18 0422 08/04/18 0624 08/05/18 0342 08/09/18 0255  WBC 15.7* 15.1* 11.7* 13.0* 9.3  NEUTROABS  --   --   --   --  7.2  HGB 10.2* 9.2* 9.6* 9.6* 8.5*  HCT 30.9* 28.6* 29.4* 29.7* 27.1*  MCV 89.6 91.7 92.5 92.0 91.2  PLT 182 185 191 220 367    Coagulation Studies: No results for input(s): LABPROT, INR in the last 72 hours.  Imaging: Ct Head Wo Contrast  Result Date: 08/09/2018 CLINICAL DATA:  Altered mental status EXAM: CT HEAD WITHOUT CONTRAST TECHNIQUE: Contiguous axial images were obtained from the base of the skull through the vertex without intravenous contrast. COMPARISON:  Head CT 07/31/2009 FINDINGS: Brain: There is no hemorrhage or extra-axial collection. Unchanged appearance of calcified meningioma at the left  sphenoid wing. The size and configuration of the ventricles and extra-axial CSF spaces are normal. There is no acute or chronic infarction. There is hypoattenuation of the periventricular white matter, most commonly indicating chronic ischemic microangiopathy. Vascular: Atherosclerotic calcification of the internal carotid arteries at the skull base. No abnormal hyperdensity of the major intracranial arteries or dural venous sinuses. Skull: The visualized skull base, calvarium and extracranial soft tissues are normal. Sinuses/Orbits: Left-greater-than-right maxillary sinus opacification with fluid levels. There is also a left mastoid effusion. The orbits are normal. IMPRESSION: 1. No acute intracranial abnormality. 2. Unchanged appearance of calcified left sphenoid wing meningioma. 3. Maxillary sinus opacification with fluid levels. Correlate for symptoms of acute sinusitis. Electronically Signed   By: Ulyses Jarred M.D.   On: 08/09/2018 02:19   Ct Chest Wo Contrast  Result Date: 08/09/2018 CLINICAL DATA:  SOB. Hx small cell lung cancer, paroxysmal a-fib, HTN, diabetes, CHF, CAD, and CABG. EXAM: CT CHEST WITHOUT CONTRAST TECHNIQUE: Multidetector CT imaging of the chest was performed following the standard protocol without IV contrast. COMPARISON:  Chest CT 07/27/2018 FINDINGS: Cardiovascular: Coronary, aortic arch, and branch vessel atherosclerotic vascular disease. Mild cardiomegaly. Compared to the prior chest CT there has been interval CABG. Atrial appendage clip noted. Mediastinum/Nodes: Expected residual mediastinal edema. Trace residual pneumomediastinum, but nearly completely resolved. Small pericardial effusion, not unexpected in the postoperative setting. Pathologic mediastinal mass/adenopathy. An index AP window lymph node measures 1.7 cm in short axis on image 49/3, stable. The more bulky right paratracheal adenopathy likewise appears relatively stable. Some of this is in  the vicinity of the SVC and  is at least exerting extrinsic mass effect on the SVC. Lungs/Pleura: There complete obstruction of the right upper lobe bronchus with complete atelectasis of the right upper lobe. Possible bulging tumor into the right mainstem bronchus from the ostium of the right upper lobe bronchus on image 55/4. The bronchus intermedius and its branches appear patent. Moderate right and small left pleural effusions. The right pleural effusion was previously small and the left pleural effusion is new compared to the prior CT. There is passive atelectasis associated with the pleural effusions. Mild bilateral airway thickening. Paraseptal emphysema. Upper Abdomen: High-density liver with borderline appearance for hemochromatosis. Portacaval node abnormally enlarged at 2.2 cm in short axis on image 144/3, stable. Slight lobularity of the right adrenal gland without a well-defined mass. Musculoskeletal: Lower thoracic spondylosis. No findings of sternal dehiscence. IMPRESSION: 1. Complete atelectasis of the right upper lobe. Probable tumor in the right upper lobe bronchus. Notable mediastinal and likely right hilar adenopathy along with a stable enlarged portacaval lymph node in the upper abdomen which could be malignant. 2. Postoperative findings related to recent CABG including a small residual amount of mediastinal edema and a trace amount of residual pneumomediastinum. 3. There is a moderate right pleural effusion (formerly small on 07/27/2018) and a new small left pleural effusion. These are associated with passive atelectasis. 4. Airway thickening is present, suggesting bronchitis or reactive airways disease. 5. Other imaging findings of potential clinical significance: Aortic Atherosclerosis (ICD10-I70.0). Coronary atherosclerosis with recent CABG. Emphysema (ICD10-J43.9). High-density hepatic parenchyma suspicious for hemochromatosis. Electronically Signed   By: Van Clines M.D.   On: 08/09/2018 02:30   Dg Chest Port  1 View  Result Date: 08/09/2018 CLINICAL DATA:  Right lung cancer. Postoperative for CABG by 1 week. EXAM: PORTABLE CHEST 1 VIEW COMPARISON:  08/08/2018 FINDINGS: There complete atelectasis of the right upper lobe with right hilar prominence favoring tumor. Atrial appendage clip noted.  Prior CABG. Atherosclerotic calcification of the aortic arch. Right basilar atelectasis with some resulting irregularity of the right hemidiaphragm. No overt cardiomegaly.  Thoracic spondylosis. IMPRESSION: 1. Continued complete atelectasis of the right upper lobe. Mildly increased atelectasis along the right hemidiaphragm. 2. Recent CABG without edema. 3. Atrial appendage clip. 4.  Aortic Atherosclerosis (ICD10-I70.0). Electronically Signed   By: Van Clines M.D.   On: 08/09/2018 01:19     I have reviewed the above imaging    ASSESSMENT AND PLAN  77 y.o. male past medical history for unstable angina status post CABG and newly developed right upper lobe atelectasis due to lung cancer with spells concerning for seizures. However, description of spells with preservation of consciousness, I have  A lower suspicion this is seizure activity.  Superior vena cava obstruction may be a possibility given his coughing preceding the event.   Spells  Recommendation - CTA chest for SVC compression - MRI brain w/wo contrast to look for brain mets - Routine EEG - Continue Keppra 500  mg emperically for now, if MRI brain and EEG is normal, could possibly discontinue.   Sushanth Aroor [facility] Pager Number [phone]

## 2018-08-09 NOTE — Progress Notes (Signed)
PCCM INTERVAL PROGRESS NOTE  Called to bedside by admitting MD. Apparently these cough induced "seizure" like episodes are becoming more frequent. Typically induced by coughing or heavy talking. He coughs and then gets very stiff and will reportedly momentarily turn blue. He does not lose consciousness during these episodes and returns to baseline immediately after.   Nursing staff and family are concerned this is seizure activity and that the patient needs a higher level of care for closer monitoring. PCCM has been asked by primary service to transfer patient to ICU.   Plan: - Transfer to ICU - Cough suppression with tussionex PRN - See consult note from earlier [place], AGACNP-BC [place] Pager [phone] or [phone]  08/09/2018 5:29 AM

## 2018-08-09 NOTE — Progress Notes (Addendum)
TCTS DAILY ICU PROGRESS NOTE                   Gregory Bates.[address]            [place] [address]          [phone]       Total Length of Stay:  LOS: 0 days   Subjective: Patient awake and alert sitting in chair. I am coughing up a storm. He then described right leg shaking and hole body shaking that happened several times while at home.  Objective: Vital signs in last 24 hours: Temp:  [98.2 F (36.8 C)-98.6 F (37 C)] 98.3 F (36.8 C) (10/08 1201) Pulse Rate:  [69-116] 69 (10/08 1200) Cardiac Rhythm: Normal sinus rhythm (10/08 1308) Resp:  [13-25] 15 (10/08 1200) BP: (92-134)/(50-94) 92/72 (10/08 1200) SpO2:  [93 %-100 %] 94 % (10/08 1200) Weight:  [81.1 kg] 81.1 kg (10/08 0000)  Filed Weights   08/09/18 0000  Weight: 81.1 kg    Weight change:     :    Intake/Output from previous day: No intake/output data recorded.  Intake/Output this shift: Total I/O In: 680 [P.O.:680] Out: 200 [Urine:200]  Current Meds: Scheduled Meds: . amiodarone  200 mg Oral BID  . atorvastatin  20 mg Oral Daily  . insulin aspart  0-9 Units Subcutaneous TID WC  . levETIRAcetam  500 mg Oral BID  . metoprolol tartrate  25 mg Oral BID   Continuous Infusions: PRN Meds:.acetaminophen **OR** acetaminophen, chlorpheniramine-HYDROcodone, levalbuterol, morphine injection, ondansetron **OR** ondansetron (ZOFRAN) IV  General appearance: alert, cooperative, no acute distress Neurologic: intact Heart: RRR Lungs: Diminshed at bases R>L Abdomen: Semi soft, non tender, protuberant, bowel sounds present,  Extremities: No LE edema Wound: Minor sero sanguinous drainage on lower chest bandage but no active oozing;RLE wound is clean and dry  Lab Results: CBC: Recent Labs    08/09/18 0255  WBC 9.3  HGB 8.5*  HCT 27.1*  PLT 367   BMET:  Recent Labs    08/09/18 0255  NA 133*  K 4.0  CL 98  CO2 25  GLUCOSE 131*  BUN 23  CREATININE 1.87*  CALCIUM 8.0*    CMET: Lab  Results  Component Value Date   WBC 9.3 08/09/2018   HGB 8.5 (L) 08/09/2018   HCT 27.1 (L) 08/09/2018   PLT 367 08/09/2018   GLUCOSE 131 (H) 08/09/2018   CHOL 170 11/04/2013   TRIG 166 (H) 11/04/2013   HDL 34 (L) 11/04/2013   LDLDIRECT 69.3 11/26/2008   LDLCALC 103 (H) 11/04/2013   ALT 29 08/09/2018   AST 21 08/09/2018   NA 133 (L) 08/09/2018   K 4.0 08/09/2018   CL 98 08/09/2018   CREATININE 1.87 (H) 08/09/2018   BUN 23 08/09/2018   CO2 25 08/09/2018   TSH 0.532 08/02/2018   PSA 1.45 11/26/2008   INR 1.35 08/01/2018   HGBA1C 6.4 (H) 07/31/2018   MICROALBUR 1.5 08/21/2013      PT/INR: No results for input(s): LABPROT, INR in the last 72 hours. Radiology: Ct Head Wo Contrast  Result Date: 08/09/2018 CLINICAL DATA:  Altered mental status EXAM: CT HEAD WITHOUT CONTRAST TECHNIQUE: Contiguous axial images were obtained from the base of the skull through the vertex without intravenous contrast. COMPARISON:  Head CT 07/31/2009 FINDINGS: Brain: There is no hemorrhage or extra-axial collection. Unchanged appearance of calcified meningioma at the left sphenoid wing. The size and configuration of the ventricles and  extra-axial CSF spaces are normal. There is no acute or chronic infarction. There is hypoattenuation of the periventricular white matter, most commonly indicating chronic ischemic microangiopathy. Vascular: Atherosclerotic calcification of the internal carotid arteries at the skull base. No abnormal hyperdensity of the major intracranial arteries or dural venous sinuses. Skull: The visualized skull base, calvarium and extracranial soft tissues are normal. Sinuses/Orbits: Left-greater-than-right maxillary sinus opacification with fluid levels. There is also a left mastoid effusion. The orbits are normal. IMPRESSION: 1. No acute intracranial abnormality. 2. Unchanged appearance of calcified left sphenoid wing meningioma. 3. Maxillary sinus opacification with fluid levels. Correlate for  symptoms of acute sinusitis. Electronically Signed   By: Ulyses Jarred M.D.   On: 08/09/2018 02:19   Ct Chest Wo Contrast  Result Date: 08/09/2018 CLINICAL DATA:  SOB. Hx small cell lung cancer, paroxysmal a-fib, HTN, diabetes, CHF, CAD, and CABG. EXAM: CT CHEST WITHOUT CONTRAST TECHNIQUE: Multidetector CT imaging of the chest was performed following the standard protocol without IV contrast. COMPARISON:  Chest CT 07/27/2018 FINDINGS: Cardiovascular: Coronary, aortic arch, and branch vessel atherosclerotic vascular disease. Mild cardiomegaly. Compared to the prior chest CT there has been interval CABG. Atrial appendage clip noted. Mediastinum/Nodes: Expected residual mediastinal edema. Trace residual pneumomediastinum, but nearly completely resolved. Small pericardial effusion, not unexpected in the postoperative setting. Pathologic mediastinal mass/adenopathy. An index AP window lymph node measures 1.7 cm in short axis on image 49/3, stable. The more bulky right paratracheal adenopathy likewise appears relatively stable. Some of this is in the vicinity of the SVC and is at least exerting extrinsic mass effect on the SVC. Lungs/Pleura: There complete obstruction of the right upper lobe bronchus with complete atelectasis of the right upper lobe. Possible bulging tumor into the right mainstem bronchus from the ostium of the right upper lobe bronchus on image 55/4. The bronchus intermedius and its branches appear patent. Moderate right and small left pleural effusions. The right pleural effusion was previously small and the left pleural effusion is new compared to the prior CT. There is passive atelectasis associated with the pleural effusions. Mild bilateral airway thickening. Paraseptal emphysema. Upper Abdomen: High-density liver with borderline appearance for hemochromatosis. Portacaval node abnormally enlarged at 2.2 cm in short axis on image 144/3, stable. Slight lobularity of the right adrenal gland without  a well-defined mass. Musculoskeletal: Lower thoracic spondylosis. No findings of sternal dehiscence. IMPRESSION: 1. Complete atelectasis of the right upper lobe. Probable tumor in the right upper lobe bronchus. Notable mediastinal and likely right hilar adenopathy along with a stable enlarged portacaval lymph node in the upper abdomen which could be malignant. 2. Postoperative findings related to recent CABG including a small residual amount of mediastinal edema and a trace amount of residual pneumomediastinum. 3. There is a moderate right pleural effusion (formerly small on 07/27/2018) and a new small left pleural effusion. These are associated with passive atelectasis. 4. Airway thickening is present, suggesting bronchitis or reactive airways disease. 5. Other imaging findings of potential clinical significance: Aortic Atherosclerosis (ICD10-I70.0). Coronary atherosclerosis with recent CABG. Emphysema (ICD10-J43.9). High-density hepatic parenchyma suspicious for hemochromatosis. Electronically Signed   By: Van Clines M.D.   On: 08/09/2018 02:30   Dg Chest Port 1 View  Result Date: 08/09/2018 CLINICAL DATA:  Right lung cancer. Postoperative for CABG by 1 week. EXAM: PORTABLE CHEST 1 VIEW COMPARISON:  08/08/2018 FINDINGS: There complete atelectasis of the right upper lobe with right hilar prominence favoring tumor. Atrial appendage clip noted.  Prior CABG. Atherosclerotic calcification of the  aortic arch. Right basilar atelectasis with some resulting irregularity of the right hemidiaphragm. No overt cardiomegaly.  Thoracic spondylosis. IMPRESSION: 1. Continued complete atelectasis of the right upper lobe. Mildly increased atelectasis along the right hemidiaphragm. 2. Recent CABG without edema. 3. Atrial appendage clip. 4.  Aortic Atherosclerosis (ICD10-I70.0). Electronically Signed   By: Van Clines M.D.   On: 08/09/2018 01:19     Assessment/Plan: S/P CABG x 3 1. CV-History of a fib. SR in  the 60's. On Amiodarone 200 mg bid and did take Apixaban as instructed at discharge. 2. Pulmonary-productive cough. Has a moderated right pleural effusion, complete atelectasis RUL, probable tumor RUL (newly diagnosed SCC) bronchus. On Tussionex for cough 3. Hyponatremia-has had in the past and had post op. Sodium now up to 133. Patient was given "salt tabs in the past" and took one daily after discharge. 4. BNP elevated at 507.8 5. Elevated creatinine-1.87. Re check in am 6. Neurologic-seizure like activity. On Keppra 500 mg bid. Neurology following. Await MRI of the brain   Donielle Liston Alba PA-C 08/09/2018 2:54 PM   Some increase right effusion compared  to d/c may need thoracentesis Wound intact and healing well I have seen and examined Gregory Bates and agree with the above assessment  and plan.  Grace Isaac MD Beeper [phone] Office [phone] 08/09/2018 6:06 PM

## 2018-08-09 NOTE — Progress Notes (Signed)
Patient just arrived from [place]. Alert and oriented. Vitals stable. But patient started coughing, shaking intensively, color turned blue. The shaking lasted for about a minute. Patient and grand son endorsed that he has been going since after CABG recently. MD notified.

## 2018-08-09 NOTE — Procedures (Signed)
ELECTROENCEPHALOGRAM REPORT   Patient: Gregory Bates       Room #: 8H72B EEG No. ID: [id number] Age: 77 y.o.        Sex: male Referring Physician: Byrum Report Date:  08/09/2018        Interpreting Physician: Alexis Goodell  History: Gregory Bates is an 77 y.o. male with seizure-like activity  Medications:  Pacerone, Lipitor, Insulin, Keppra, Lopressor  Conditions of Recording:  This is a 21 channel routine scalp EEG performed with bipolar and monopolar montages arranged in accordance to the international 10/20 system of electrode placement. One channel was dedicated to EKG recording.  The patient is in the awake and drowsy states.  Description:  Muscle and movement artifact are prominent during there recording often obscuring the background rhythm.  When able to be visualized the waking background activity consists of a low voltage, symmetrical, fairly well organized, 10 Hz alpha activity, seen from the parieto-occipital and posterior temporal regions.  Low voltage fast activity, poorly organized, is seen anteriorly and is at times superimposed on more posterior regions.  A mixture of theta and alpha rhythms are seen from the central and temporal regions. The patient drowses with slowing to irregular, low voltage theta and beta activity.   Stage II sleep is not obtained. No epileptiform activity is noted.   Hyperventilation and intermittent photic stimulation were not performed.  IMPRESSION: Normal electroencephalogram, awake and drowsy. There are no focal lateralizing or epileptiform features.   Alexis Goodell, MD Neurology [phone] 08/09/2018, 1:22 PM

## 2018-08-09 NOTE — Plan of Care (Signed)
  Problem: Nutrition: Goal: Adequate nutrition will be maintained Outcome: Progressing   Problem: Coping: Goal: Level of anxiety will decrease Outcome: Progressing   Problem: Pain Managment: Goal: General experience of comfort will improve Outcome: Progressing   

## 2018-08-09 NOTE — Significant Event (Signed)
Rapid Response Event Note  I received a call from [facility] about patient having more coughing spells. I went up to see the patient. When I arrived, patient was alert and talking, while I assessing the patient, I witnessed 2 more episodes. Both started when an acute onset of severe coughing, patient's face/neck/lips turn blue, his arms and legs contract out, patient appears air hunger during episodes, and then episode ends. Patient was labored after episode that I witness but after a few minutes, his RR normalized. Patient did not lose consciousness and was aware of the episode happening. VSS. No focal neuro deficits noted.  I brought the [facility] cart into the room and [facility] RN also witnessed the episodes.   I paged the [facility] MD and updated him. Currently I feel like the patient should be moved to the ICU for closer monitoring given that theses are abrupt and have become more frequent. Neuro MD came and assessed the patient as well and PCCM NP came and re-assessed the patient.   Plan: - MR BRAIN was ordered - MRI tech will check if patient can come - recent cardiac surgery - sternal wires  - Transfer to CVICU for closer monitoring.   Call Time 335  End Time 545

## 2018-08-09 NOTE — Consult Note (Addendum)
PULMONARY / CRITICAL CARE MEDICINE   NAME:  Gregory Bates, MRN:  [id number], DOB:  02-Sep-1941, LOS: 0 ADMISSION DATE:  08/09/2018, CONSULTATION DATE: 08/09/2018 REFERRING MD: Hal Hope  , CHIEF COMPLAINT: Chest pain  BRIEF HISTORY:    77 year old male with newly diagnosed small cell lung cancer and status post CABG October 2019 coming in with shortness of breath chest pain and possible seizure activity.  HISTORY OF PRESENT ILLNESS   77 year old male with diagnosis of coronary artery disease status post multiple ST elevation MI was admitted recently for chest pain and had a CABG on August 02, 2018 where he was also diagnosed with small cell lung cancer of the right upper lobe.  He was discharged on 5 October but according to his son patient continued to have chest pain and shortness of breath and he was taken to [facility] emergency room then was transferred to our hospital under hospitalist care.  We are called to assess the patient for chest pain which occurs the patient is on his chest wall and is reproducible.  Patient also complains of shortness of breath at rest denies any fever no chills no rigors.  Patient does have some whitish to yellowish productive sputum.  During his stay on the floor he had a coughing spell and then he turned cyanosed and had shaking of all of his body and had another episode later in the night.  According to his son he had a couple of these episodes at home no loss of urine control.  As per nurse patient might have lost his consciousness for a few seconds.     SIGNIFICANT EVENTS:  Admit to the hospital on 08/09/2018 then transferred to the ICU. STUDIES:    CULTURES:    ANTIBIOTICS:  None  LINES/TUBES:    CONSULTANTS:  Neuro SUBJECTIVE:    CONSTITUTIONAL: BP (!) 104/50 (BP Location: Right Arm)   Pulse 79   Temp 98.2 F (36.8 C) (Oral)   Resp 13   Ht 5\' 11"  (1.803 m)   Wt 81.1 kg   SpO2 97%   BMI 24.95 kg/m   No intake/output data  recorded.    On nasal cannula satting 95%    PHYSICAL EXAM: General: Chronically ill not in distress Neuro: Awake alert oriented moving all extremities no focal weakness HEENT: Moist Cardiovascular: Normal heart sound no sounds or murmurs  lungs: Clear clear some bilateral crackles no wheezing  abdomen: Soft no tenderness no guarding Musculoskeletal: No edema  skin: No rash  RESOLVED PROBLEM LIST   ASSESSMENT AND PLAN   Assessment -Suspected new onset seizure -Chest pain possibly related to recent CABG -Right upper lobe atelectasis from newly diagnosed small cell lung cancer -Acute hypoxemic respiratory failure requiring nasal cannula -Hyponatremia -Acute on chronic kidney disease  Plan: -Neuro consult we appreciate their input -Patient has coughing spells followed by these episode not sure if it is really pathology cold neuro seizures or heart related to coughing spells and hypoxia or vasovagal. -EEG -MRI brain -Keppra -Incentive spirometry - nebs  -Transferred to the ICU since not comfortable with the patient on the floor as per nursing -I will hold off on starting antibiotics but will have low threshold especially with his atelectasis and productive sputum  SUMMARY OF TODAY'S PLAN:    Best Practice / Goals of Care / Disposition.   DVT PROPHYLAXIS:  SUP:  NUTRITION: Cardiac MOBILITY: Seizure precautions GOALS OF CARE: Full code FAMILY DISCUSSIONS: Discussed with son at bedside DISPOSITION ICU  LABS  Glucose Recent Labs  Lab 08/05/18 1128 08/05/18 1604 08/05/18 2124 08/06/18 0624 08/09/18 0051 08/09/18 0442  GLUCAP 122* 138* 137* 124* 130* 140*    BMET Recent Labs  Lab 08/05/18 0342 08/06/18 0303 08/09/18 0255  NA 128* 127* 133*  K 4.1 4.1 4.0  CL 93* 94* 98  CO2 25 23 25   BUN 21 18 23   CREATININE 1.50* 1.38* 1.87*  GLUCOSE 109* 117* 131*    Liver Enzymes Recent Labs  Lab 08/05/18 0342 08/09/18 0255  AST 24 21  ALT 35 29  ALKPHOS  49 52  BILITOT 0.8 0.8  ALBUMIN 2.9* 2.7*    Electrolytes Recent Labs  Lab 08/02/18 1638 08/03/18 0422  08/05/18 0342 08/06/18 0303 08/09/18 0255 08/09/18 0531  CALCIUM  --  7.9*   < > 8.0* 8.5* 8.0*  --   MG 2.5* 2.1  --   --   --   --  1.9   < > = values in this interval not displayed.    CBC Recent Labs  Lab 08/04/18 0624 08/05/18 0342 08/09/18 0255  WBC 11.7* 13.0* 9.3  HGB 9.6* 9.6* 8.5*  HCT 29.4* 29.7* 27.1*  PLT 191 220 367    ABG No results for input(s): PHART, PCO2ART, PO2ART in the last 168 hours.  Coag's No results for input(s): APTT, INR in the last 168 hours.  Sepsis Markers Recent Labs  Lab 08/09/18 0255 08/09/18 0531  LATICACIDVEN 1.2 1.1    Cardiac Enzymes Recent Labs  Lab 08/09/18 0255 08/09/18 0531  TROPONINI 0.35* 0.38*    PAST MEDICAL HISTORY :   He  has a past medical history of Acute renal insufficiency, Anemia, unspecified, CAD (coronary artery disease), native coronary artery, Chronic combined systolic and diastolic CHF (congestive heart failure) ([place]) (12/23/2015), Colorectal polyps (2002), Diabetes mellitus, Erectile dysfunction, History of echocardiogram, cardiovascular stress test, Hyperlipidemia, Hypertension, Ischemic cardiomyopathy, Myocardial infarction ([place]), PAF (paroxysmal atrial fibrillation) ([place]), and Small cell lung cancer in adult [facility]) (07/28/2018).  PAST SURGICAL HISTORY:  He  has a past surgical history that includes [place] tooth extraction; Colonoscopy w/ polypectomy (2002); infantile paralysis facial asymmetry; Coronary stent placement (2007, 2011); Colonoscopy w/ polypectomy (2004; 2007 negative); Coronary angioplasty (12/26/2013); left heart catheterization with coronary angiogram (N/A, 10/02/2012); percutaneous coronary stent intervention (pci-s) (10/02/2012); left heart catheterization with coronary angiogram (N/A, 02/20/2013); percutaneous coronary stent intervention (pci-s) (02/20/2013); left heart catheterization  with coronary angiogram (N/A, 12/26/2013); percutaneous coronary stent intervention (pci-s) (12/26/2013); left heart catheterization with coronary angiogram (N/A, 11/28/2014); LEFT HEART CATH AND CORONARY ANGIOGRAPHY (N/A, 05/18/2017); LEFT HEART CATH AND CORONARY ANGIOGRAPHY (N/A, 07/27/2018); Coronary artery bypass graft (N/A, 08/01/2018); TEE without cardioversion (N/A, 08/01/2018); Clipping of atrial appendage (Left, 08/01/2018); and biopsy (08/01/2018).  Allergies  Allergen Reactions  . Brilinta [Ticagrelor] Other (See Comments)    Arthralgias & myalgias  . Crestor [Rosuvastatin] Other (See Comments)    Myalgias    No current facility-administered medications on file prior to encounter.    Current Outpatient Medications on File Prior to Encounter  Medication Sig  . acetaminophen (TYLENOL) 325 MG tablet Take 2 tablets (650 mg total) by mouth every 6 (six) hours as needed for mild pain.  [name] Kitchen amiodarone (PACERONE) 200 MG tablet Take 1 tablet (200 mg total) by mouth 2 (two) times daily. For one week;then take 200 mg daily thereafter  . aspirin EC 81 MG EC tablet Take 1 tablet (81 mg total) by mouth daily.  [name] Kitchen atorvastatin (LIPITOR) 20 MG tablet  Take 1 tablet (20 mg total) by mouth daily.  . cholecalciferol (VITAMIN D) 1000 UNITS tablet Take 1,000 Units by mouth every evening.   [name] Kitchen ELIQUIS 5 MG TABS tablet TAKE 1 TABLET BY MOUTH TWICE DAILY (Patient taking differently: Take 5 mg by mouth 2 (two) times daily. )  . fexofenadine (ALLEGRA) 180 MG tablet Take 180 mg by mouth daily as needed for allergies.   [name] Kitchen guaiFENesin (MUCINEX) 600 MG 12 hr tablet Take 1 tablet (600 mg total) by mouth 2 (two) times daily as needed for cough or to loosen phlegm.  . metFORMIN (GLUCOPHAGE) 500 MG tablet Take 500 mg by mouth daily.  . metoprolol tartrate (LOPRESSOR) 25 MG tablet Take 1 tablet (25 mg total) by mouth 2 (two) times daily.  . Multiple Vitamin (MULTIVITAMIN) tablet Take 1 tablet by mouth at bedtime.   . traMADol  (ULTRAM) 50 MG tablet Take 50 mg by mouth every 4-6 hours PRN severe pain  . vitamin E 100 UNIT capsule Take 100 Units by mouth at bedtime.     FAMILY HISTORY:   His family history includes Colon cancer in his brother; Heart attack (age of onset: 62) in his brother. There is no history of Diabetes, Stroke, or Hypertension.  SOCIAL HISTORY:  He  reports that he quit smoking about 33 years ago. He has never used smokeless tobacco. He reports that he does not drink alcohol or use drugs.  REVIEW OF SYSTEMS:    All 11 point system review were unremarkable other than what is mentioned history of present illness.

## 2018-08-09 NOTE — Progress Notes (Signed)
Noted increased congestion. RT giving neb, order obtained for one time dose lasix to see if it helps. Monitor

## 2018-08-09 NOTE — Progress Notes (Signed)
CRITICAL VALUE ALERT  Critical Value:  Troponin  Date & Time Notied:  8337 08/09/18  Provider Notified: Dr. Hal Hope  Orders Received/Actions taken: Order received for Magnesium

## 2018-08-09 NOTE — Progress Notes (Addendum)
Per MRI team, pt unable to safely have MRI scan.   UPDATE: 1100, called back by MRI team who state that the clip is MRI compatible. MD [place] paged to make aware.

## 2018-08-09 NOTE — Significant Event (Addendum)
Rapid Response Event Note  Overview: "Second Set of Eyes"  Initial Focused Assessment: Called RN about needing assistance with patient. Patient was transferred from [facility] and started complaining of RIGHT lateral chest pain. Patient s/p recent CABG and was d/c on 10/5. Sternotomy incision and chest tube sites (old) look good, no subcutaneous air was felt, equal chest movement, lung sounds: very diminished on the right, + coarse crackles throughout, bilateral air movement. Oxygen saturations > 95% on 4L [place], NOT in acute respiratory distress but patient had an episode of severe coughing, coupled with shaking, and turned blue per nurse. VSS and not in acute distress, endorse pain RIGHT lateral chest - MD was at the bedside, ordered Morphine 1mg  IV. Neuro intact (at baseline), answers questions/follow commands.  Interventions: - STAT CT HEAD  - STAT CT CHEST  Plan of Care: - Pain management - Monitor VS  Event Summary:    at     Call Time 0135 Arrival Time 0137 End Time 0235  Srishti Strnad R

## 2018-08-09 NOTE — Progress Notes (Signed)
Order received to transfer to [facility] room 14. Report given to [facility]. Family updated.

## 2018-08-09 NOTE — H&P (Signed)
History and Physical    Gregory Bates IRS:[id number] DOB: [birth year] DOA: 08/09/2018  PCP: Venetia Maxon, Sharon Mt, MD  Patient coming from: Patient was transferred from [facility].  Chief Complaint: Shortness of breath.  HPI: Gregory Bates is a 77 y.o. male with history of recent admission for unstable angina underwent CABG and was discharged on October 5 3 days ago following which patient started developing diarrhea on the day of discharge and 2 days later patient became more short of breath.  Was taken to [facility] and at [facility] x-rays show revealed progressive atelectasis of the right upper lobe with known history of recent diagnosis of lung cancer.  As per the grandson who also provided the history patient was doing fine yesterday morning by evening patient became suddenly short of breath and coughing spells.  Patient's grandson also noted generalized tonic-clonic like activity concerning for seizures which lasted for 1 minute and did not have any associated incontinence of urine or tongue bite and did not lose consciousness and was not postictal.  Happened twice before going to [facility] happened once at the hospital.    In [facility] patient had a chest x-ray showing progressive atelectasis of the right upper lobe.  Patient was mildly hypotensive for which patient was given fluid bolus following which blood pressure improved.  Labs done over that showed CBC with WBC count of 12.8 hemoglobin of 9.9 platelets of 460.  INR 1.2.  ABG showed pH of 7.47 PCO2 35 PO2 58.  Plain metabolic panel showed sodium 130 potassium 3.9 carbon dioxide 25 glucose 116 creatinine 1.9 troponin 0.6 proBNP 2100 AST 32 ALT 42.  On my exam initially patient was not in any acute distress and stated that his illness of breath improved.  Shortly after my exam patient became suddenly again short of breath with right-sided chest pain.  I ordered 1 dose of morphine 1 mg IV ordered CT chest.   Chest x-ray ordered at admission during this time showed progressive atelectasis and right-sided pleural effusion.  I have also consulted pulmonary critical care.  Patient again had this tonic-clonic like activity for which I have ordered CT head and also consulted neurologist for concern for seizures.  During this tonic-clonic episode at this facility the episode lasted for less than a minute did not have any confusion after the episode and patient remembers the episode and did not have any incontinence of urine or bowel.  ED Course: Patient was a direct admit.  Review of Systems: As per HPI, rest all negative.   Past Medical History:  Diagnosis Date  . Acute renal insufficiency    a. During 11/2013 adm with atrial flutter.  . Anemia, unspecified   . CAD (coronary artery disease), native coronary artery    a. s/p cath May 2011 >> DES distal RCA;  b. 01/2013 NSTEMI >> OM1 99p (2.75x12 Promus Premier DES);  c. LHC (2/15): PCI: Promus DES to dist RCA ;  d. LHC 1/16 - dLM 63, oLAD 30, small D1 tandem 1, pOM1 stent ok, mOM 50-60, dOM bifurcation 50-60 in both vessels, AV 106-70 jailed by stent, pRCA 93 mRCA stent ok, dRCA stent ok, PLB smal 50, dPDA 60, 80; EF 35-40 >> med Rx - PCI of PDA if angina  . Chronic combined systolic and diastolic CHF (congestive heart failure) ([place]) 12/23/2015   Echo 2/17: Mild LVH, EF 45-50%, inferior akinesis, grade 2 diastolic dysfunction, mild AI, mild MR, mild LAE, PASP 44 mmHg   .  Colorectal polyps 2002  . Diabetes mellitus    AODM  . Erectile dysfunction   . History of echocardiogram    a. 01/2013 Echo: EF 55%, mid-dist inflat HK, Gr1 DD, Triv AI/TR, Mild MR.;  b.  Echo (1/15): Mild LVH, EF 55%, inferolateral hypokinesis, grade 1 diastolic dysfunction, mild AI, trivial MR, moderate LAE, PASP 36 mmHg   . Hx of cardiovascular stress test    Stress myoview 08/07/13 with LVEF 44%, inferior scar, no ischemia  . Hyperlipidemia   . Hypertension   . Ischemic  cardiomyopathy    a. EF 35-40% by LHC in 1/16; b. Echo 2/17: Mild LVH, EF 45-50%, inferior akinesis, grade 2 diastolic dysfunction, mild AI, mild MR, mild LAE, PASP 44 mmHg  . Myocardial infarction ([place])   . PAF (paroxysmal atrial fibrillation) ([place])    a. Dx 11/2013. Spont conv. Placed on eliquis.  . Small cell lung cancer in adult [facility]) 07/28/2018    Past Surgical History:  Procedure Laterality Date  . BIOPSY  08/01/2018   Procedure: BIOPSY OF PERICARDIAL LYMPH NODE AND HILAR LYMPH NODE;  Surgeon: Grace Isaac, MD;  Location: [place];  Service: Open Heart Surgery;;  . CLIPPING OF ATRIAL APPENDAGE Left 08/01/2018   Procedure: CLIPPING OF ATRIAL APPENDAGE;  Surgeon: Grace Isaac, MD;  Location: [place];  Service: Open Heart Surgery;  Laterality: Left;  . COLONOSCOPY W/ POLYPECTOMY  2002   [facility]  . COLONOSCOPY W/ POLYPECTOMY  2004; 2007 negative  . CORONARY ANGIOPLASTY  12/26/2013  . CORONARY ARTERY BYPASS GRAFT N/A 08/01/2018   Procedure: CORONARY ARTERY BYPASS GRAFTING (CABG) TIMES THREE USING LEFT INTERNAL MAMMARY ARTERY TO LAD, RIGHT GREATER SAPHENOUS VEIN GRAFT TO OM AND RCA, VEIN HARVESTED ENDOSCOPICALLY;  Surgeon: Grace Isaac, MD;  Location: [place];  Service: Open Heart Surgery;  Laterality: N/A;  . CORONARY STENT PLACEMENT  2007, 2011  . infantile paralysis facial asymmetry    . LEFT HEART CATH AND CORONARY ANGIOGRAPHY N/A 05/18/2017   Procedure: Left Heart Cath and Coronary Angiography;  Surgeon: Martinique, Peter M, MD;  Location: [place] CV LAB;  Service: Cardiovascular;  Laterality: N/A;  . LEFT HEART CATH AND CORONARY ANGIOGRAPHY N/A 07/27/2018   Procedure: LEFT HEART CATH AND CORONARY ANGIOGRAPHY;  Surgeon: Martinique, Peter M, MD;  Location: [place] CV LAB;  Service: Cardiovascular;  Laterality: N/A;  . LEFT HEART CATHETERIZATION WITH CORONARY ANGIOGRAM N/A 10/02/2012   Procedure: LEFT HEART CATHETERIZATION WITH CORONARY ANGIOGRAM;  Surgeon: Peter M Martinique, MD;   Location: [facility] CATH LAB;  Service: Cardiovascular;  Laterality: N/A;  . LEFT HEART CATHETERIZATION WITH CORONARY ANGIOGRAM N/A 02/20/2013   Procedure: LEFT HEART CATHETERIZATION WITH CORONARY ANGIOGRAM;  Surgeon: Burnell Blanks, MD;  Location: [facility] CATH LAB;  Service: Cardiovascular;  Laterality: N/A;  . LEFT HEART CATHETERIZATION WITH CORONARY ANGIOGRAM N/A 12/26/2013   Procedure: LEFT HEART CATHETERIZATION WITH CORONARY ANGIOGRAM;  Surgeon: Burnell Blanks, MD;  Location: [facility] CATH LAB;  Service: Cardiovascular;  Laterality: N/A;  . LEFT HEART CATHETERIZATION WITH CORONARY ANGIOGRAM N/A 11/28/2014   Procedure: LEFT HEART CATHETERIZATION WITH CORONARY ANGIOGRAM;  Surgeon: Burnell Blanks, MD;  Location: [facility] CATH LAB;  Service: Cardiovascular;  Laterality: N/A;  . PERCUTANEOUS CORONARY STENT INTERVENTION (PCI-S)  10/02/2012   Procedure: PERCUTANEOUS CORONARY STENT INTERVENTION (PCI-S);  Surgeon: Peter M Martinique, MD;  Location: [facility] CATH LAB;  Service: Cardiovascular;;  . PERCUTANEOUS CORONARY STENT INTERVENTION (PCI-S)  02/20/2013   Procedure: PERCUTANEOUS CORONARY STENT INTERVENTION (  PCI-S);  Surgeon: Burnell Blanks, MD;  Location: [facility] CATH LAB;  Service: Cardiovascular;;  . PERCUTANEOUS CORONARY STENT INTERVENTION (PCI-S)  12/26/2013   Procedure: PERCUTANEOUS CORONARY STENT INTERVENTION (PCI-S);  Surgeon: Burnell Blanks, MD;  Location: [facility] CATH LAB;  Service: Cardiovascular;;  . TEE WITHOUT CARDIOVERSION N/A 08/01/2018   Procedure: TRANSESOPHAGEAL ECHOCARDIOGRAM (TEE);  Surgeon: Grace Isaac, MD;  Location: [place];  Service: Open Heart Surgery;  Laterality: N/A;  . WISDOM TOOTH EXTRACTION       reports that he quit smoking about 33 years ago. He has never used smokeless tobacco. He reports that he does not drink alcohol or use drugs.  Allergies  Allergen Reactions  . Brilinta [Ticagrelor] Other (See Comments)    Arthralgias & myalgias  . Crestor [Rosuvastatin] Other  (See Comments)    Myalgias    Family History  Problem Relation Age of Onset  . Heart attack Brother 69  . Colon cancer Brother   . Diabetes Neg Hx   . Stroke Neg Hx   . Hypertension Neg Hx     Prior to Admission medications   Medication Sig Start Date End Date Taking? Authorizing Provider  acetaminophen (TYLENOL) 325 MG tablet Take 2 tablets (650 mg total) by mouth every 6 (six) hours as needed for mild pain. 08/06/18   Nani Skillern, PA-C  amiodarone (PACERONE) 200 MG tablet Take 1 tablet (200 mg total) by mouth 2 (two) times daily. For one week;then take 200 mg daily thereafter 08/06/18   Nani Skillern, PA-C  aspirin EC 81 MG EC tablet Take 1 tablet (81 mg total) by mouth daily. 08/06/18   Nani Skillern, PA-C  atorvastatin (LIPITOR) 20 MG tablet Take 1 tablet (20 mg total) by mouth daily. 11/19/17   Burnell Blanks, MD  cholecalciferol (VITAMIN D) 1000 UNITS tablet Take 1,000 Units by mouth every evening.     [provider]  ELIQUIS 5 MG TABS tablet TAKE 1 TABLET BY MOUTH TWICE DAILY Patient taking differently: Take 5 mg by mouth 2 (two) times daily.  11/24/17   Burnell Blanks, MD  fexofenadine (ALLEGRA) 180 MG tablet Take 180 mg by mouth daily as needed for allergies.     [provider]  guaiFENesin (MUCINEX) 600 MG 12 hr tablet Take 1 tablet (600 mg total) by mouth 2 (two) times daily as needed for cough or to loosen phlegm. 08/06/18   Nani Skillern, PA-C  metFORMIN (GLUCOPHAGE) 500 MG tablet Take 500 mg by mouth daily.    [provider]  metoprolol tartrate (LOPRESSOR) 25 MG tablet Take 1 tablet (25 mg total) by mouth 2 (two) times daily. 02/21/18   Burnell Blanks, MD  Multiple Vitamin (MULTIVITAMIN) tablet Take 1 tablet by mouth at bedtime.     [provider]  traMADol (ULTRAM) 50 MG tablet Take 50 mg by mouth every 4-6 hours PRN severe pain 08/06/18   Nani Skillern, PA-C  vitamin E 100  UNIT capsule Take 100 Units by mouth at bedtime.     [provider]    Physical Exam: Vitals:   08/09/18 0000  BP: (!) 105/94  Pulse: (!) 116  Resp: 16  Temp: 98.2 F (36.8 C)  TempSrc: Oral  SpO2: 98%  Height: 5\' 11"  (1.803 m)      Constitutional: Moderately built and nourished. Vitals:   08/09/18 0000  BP: (!) 105/94  Pulse: (!) 116  Resp: 16  Temp: 98.2 F (  36.8 C)  TempSrc: Oral  SpO2: 98%  Height: 5\' 11"  (1.803 m)   Eyes: Anicteric no pallor. ENMT: No discharge from the ears eyes nose or mouth. Neck: No mass felt.  No neck rigidity.  No JVD appreciated. Respiratory: No rhonchi or crepitations. Cardiovascular: S1-S2 heard no murmurs appreciated. Abdomen: Soft nontender bowel sounds present. Musculoskeletal: No edema.  No joint effusion. Skin: No rash.  Skin appears warm. Neurologic: Alert awake oriented to time place and person.  Moves all extremities 5 x 5.  No facial asymmetry tongue is midline.  Pupils equal reacting to light.  Has changes in the mouth from previous polio. Psychiatric: Appears normal.   Labs on Admission: I have personally reviewed following labs and imaging studies  CBC: Recent Labs  Lab 08/02/18 0401 08/02/18 1634 08/02/18 1638 08/03/18 0422 08/04/18 0624 08/05/18 0342  WBC 11.8*  --  15.7* 15.1* 11.7* 13.0*  HGB 10.6* 10.2* 10.2* 9.2* 9.6* 9.6*  HCT 32.1* 30.0* 30.9* 28.6* 29.4* 29.7*  MCV 89.7  --  89.6 91.7 92.5 92.0  PLT 176  --  182 185 191 956   Basic Metabolic Panel: Recent Labs  Lab 08/02/18 0401 08/02/18 1634 08/02/18 1638 08/03/18 0422 08/04/18 0624 08/05/18 0342 08/06/18 0303  NA 128* 127*  --  127* 128* 128* 127*  K 4.9 4.6  --  4.2 4.2 4.1 4.1  CL 99 95*  --  96* 94* 93* 94*  CO2 22  --   --  24 24 25 23   GLUCOSE 107* 167*  --  141* 113* 109* 117*  BUN 14 17  --  16 21 21 18   CREATININE 1.33* 1.30* 1.37* 1.18 1.29* 1.50* 1.38*  CALCIUM 8.1*  --   --  7.9* 8.3* 8.0* 8.5*  MG 2.4  --  2.5* 2.1   --   --   --    GFR: Estimated Creatinine Clearance: 47.7 mL/min (A) (by C-G formula based on SCr of 1.38 mg/dL (H)). Liver Function Tests: Recent Labs  Lab 08/05/18 0342  AST 24  ALT 35  ALKPHOS 49  BILITOT 0.8  PROT 5.7*  ALBUMIN 2.9*   No results for input(s): LIPASE, AMYLASE in the last 168 hours. No results for input(s): AMMONIA in the last 168 hours. Coagulation Profile: No results for input(s): INR, PROTIME in the last 168 hours. Cardiac Enzymes: No results for input(s): CKTOTAL, CKMB, CKMBINDEX, TROPONINI in the last 168 hours. BNP (last 3 results) No results for input(s): PROBNP in the last 8760 hours. HbA1C: No results for input(s): HGBA1C in the last 72 hours. CBG: Recent Labs  Lab 08/05/18 1128 08/05/18 1604 08/05/18 2124 08/06/18 0624 08/09/18 0051  GLUCAP 122* 138* 137* 124* 130*   Lipid Profile: No results for input(s): CHOL, HDL, LDLCALC, TRIG, CHOLHDL, LDLDIRECT in the last 72 hours. Thyroid Function Tests: No results for input(s): TSH, T4TOTAL, FREET4, T3FREE, THYROIDAB in the last 72 hours. Anemia Panel: No results for input(s): VITAMINB12, FOLATE, FERRITIN, TIBC, IRON, RETICCTPCT in the last 72 hours. Urine analysis:    Component Value Date/Time   COLORURINE YELLOW 07/31/2018 2035   APPEARANCEUR CLEAR 07/31/2018 2035   LABSPEC 1.017 07/31/2018 2035   PHURINE 5.0 07/31/2018 2035   GLUCOSEU NEGATIVE 07/31/2018 2035   HGBUR NEGATIVE 07/31/2018 2035   BILIRUBINUR NEGATIVE 07/31/2018 2035   KETONESUR NEGATIVE 07/31/2018 2035   PROTEINUR NEGATIVE 07/31/2018 2035   NITRITE NEGATIVE 07/31/2018 2035   LEUKOCYTESUR NEGATIVE 07/31/2018 2035   Sepsis Labs: @LABRCNTIP (procalcitonin:4,lacticidven:4) )  Recent Results (from the past 240 hour(s))  Surgical pcr screen     Status: Abnormal   Collection Time: 07/31/18 11:57 AM  Result Value Ref Range Status   MRSA, PCR NEGATIVE NEGATIVE Final   Staphylococcus aureus POSITIVE (A) NEGATIVE Final     Comment: (NOTE) The Xpert SA Assay (FDA approved for NASAL specimens in patients 24 years of age and older), is one component of a comprehensive surveillance program. It is not intended to diagnose infection nor to guide or monitor treatment. Performed at [facility], [place] [address] [place], [place] [address]      Radiological Exams on Admission: Dg Chest Port 1 View  Result Date: 08/09/2018 CLINICAL DATA:  Right lung cancer. Postoperative for CABG by 1 week. EXAM: PORTABLE CHEST 1 VIEW COMPARISON:  08/08/2018 FINDINGS: There complete atelectasis of the right upper lobe with right hilar prominence favoring tumor. Atrial appendage clip noted.  Prior CABG. Atherosclerotic calcification of the aortic arch. Right basilar atelectasis with some resulting irregularity of the right hemidiaphragm. No overt cardiomegaly.  Thoracic spondylosis. IMPRESSION: 1. Continued complete atelectasis of the right upper lobe. Mildly increased atelectasis along the right hemidiaphragm. 2. Recent CABG without edema. 3. Atrial appendage clip. 4.  Aortic Atherosclerosis (ICD10-I70.0). Electronically Signed   By: Van Clines M.D.   On: 08/09/2018 01:19    EKG: Independently reviewed.  Has been read as junctional rhythm.  Assessment/Plan Principal Problem:   Acute respiratory failure with hypoxia (HCC) Active Problems:   Type 2 diabetes mellitus with vascular disease (HCC)   Chronic diastolic CHF (congestive heart failure) (HCC)   Hyponatremia   PAF (paroxysmal atrial fibrillation) (HCC)   CKD (chronic kidney disease), stage III (HCC)   S/P CABG x 3   Small cell lung cancer in adult ([place])   Seizure-like activity ([place])    1. Acute respiratory failure with hypoxia -likely from possible progressive atelectasis with known lung mass and new right-sided pleural effusion.  CT chest has been ordered I will consult pulmonary critical care. 2. Seizure-like activity -has had multiple episodes neurologist  recommended to start patient on Keppra 1.5 g and neurology will be evaluating the patient.  CT head is pending.  Check EEG. 3. Paroxysmal atrial fibrillation on amiodarone and beta-blocker.  Pulmonary critical care advised me to hold off Eliquis for now since patient may need further study on the pleural effusion. 4. CAD status post recent CABG -denies any chest pain.  Follow CT chest. 5. Chronic kidney disease stage III creatinine is mildly elevated from baseline at this time is around 1.9. 6. Anemia normocytic normochromic appears to be chronic follow CBC. 7. Diabetes mellitus type 2 we will keep patient on sliding scale coverage and hold metformin for now. 8. Chronic hyponatremia likely from recent diagnosis of small cell lung cancer. 9. Small cell lung cancer will need further output from oncology.   DVT prophylaxis: Foot pump. Code Status: Full code. Family Communication: Patient's grandson. Disposition Plan: To be determined. Consults called: Pulmonary critical care and neurology. Admission status: Observation.   Rise Patience MD [facility] Pager [phone].  If 7PM-7AM, please contact night-coverage [email or web address] Password [email or web address]  08/09/2018, 2:04 AM

## 2018-08-10 ENCOUNTER — Encounter: Payer: Self-pay | Admitting: *Deleted

## 2018-08-10 ENCOUNTER — Inpatient Hospital Stay (HOSPITAL_COMMUNITY): Payer: BLUE CROSS/BLUE SHIELD

## 2018-08-10 DIAGNOSIS — J9601 Acute respiratory failure with hypoxia: Secondary | ICD-10-CM

## 2018-08-10 LAB — GLUCOSE, CAPILLARY
Glucose-Capillary: 100 mg/dL — ABNORMAL HIGH (ref 70–99)
Glucose-Capillary: 134 mg/dL — ABNORMAL HIGH (ref 70–99)

## 2018-08-10 LAB — PROCALCITONIN: Procalcitonin: <0.1 ng/mL

## 2018-08-10 MED ORDER — ASPIRIN 325 MG PO TABS: 325.0000 mg | ORAL_TABLET | Freq: Every day | ORAL | Status: DC

## 2018-08-10 MED ORDER — LEVALBUTEROL HCL 0.63 MG/3ML IN NEBU: 0.6300 mg | INHALATION_SOLUTION | Freq: Three times a day (TID) | RESPIRATORY_TRACT | Status: DC

## 2018-08-10 MED ORDER — ASPIRIN EC 81 MG PO TBEC
81.0000 mg | DELAYED_RELEASE_TABLET | Freq: Every day | ORAL | Status: DC
  Administered 2018-08-10: 81 mg via ORAL
  Filled 2018-08-10: qty 1

## 2018-08-10 MED ORDER — APIXABAN 5 MG PO TABS
5.0000 mg | ORAL_TABLET | Freq: Two times a day (BID) | ORAL | Status: DC
  Administered 2018-08-10: 5 mg via ORAL
  Filled 2018-08-10: qty 1

## 2018-08-10 NOTE — Progress Notes (Signed)
PULMONARY / CRITICAL CARE MEDICINE   NAME:  Gregory Bates, MRN:  [id number], DOB:  [birth year], LOS: 1 ADMISSION DATE:  08/09/2018, CONSULTATION DATE:  10/8 CHIEF COMPLAINT: Dyspnea and altered mental status  BRIEF HISTORY:         77 year old diabetic status post CABG on August 02, 2018 return to the hospital shortness of breath and abnormal motor activity. HISTORY OF PRESENT ILLNESS      77 year old male with diagnosis of coronary artery disease status post multiple ST elevation MI was admitted recently for chest pain and had a CABG on August 02, 2018 where he was also diagnosed with small cell lung cancer of the right upper lobe.  He was discharged on 5 October but according to his son patient continued to have chest pain and shortness of breath and he was taken to [facility] emergency room then was transferred to our hospital under hospitalist care.  We are called to assess the patient for chest pain which occurs the patient is on his chest wall and is reproducible.  Patient also complains of shortness of breath at rest denies any fever no chills no rigors.  Patient does have some whitish to yellowish productive sputum.  During his stay on the floor he had a coughing spell and then he turned cyanosed and had shaking of all of his body and had another episode later in the night.  According to his son he had a couple of these episodes at home no loss of urine control.  As per nurse patient might have lost his consciousness for a few seconds.   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  EEG showed no evidence of seizure activity. CULTURES:    ANTIBIOTICS:  none  LINES/TUBES:    CONSULTANTS:   SUBJECTIVE:     Gregory Bates has had no further episodes of altered mental status.   He says that he is breathing comfortably on 2 L nasal cannula and has been ambulated 2 laps around the intensive care unit this morning.  CONSTITUTIONAL: BP (!) 109/49   Pulse 71   Temp 98.9 F (37.2 C) (Oral)   Resp 11   Ht 5\' 11"  (1.803 m)   Wt 81.1 kg   SpO2 95%   BMI 24.95 kg/m   I/O last 3 completed shifts: In: 1280 [P.O.:1280] Out: 1050 [Urine:1050]        PHYSICAL EXAM: General: Very alert and interactive sitting up in a chair, garrulous and in no distress Neuro: Alert and appropriately interactive, oriented x3 and using all fours Cardiovascular: Sternum is well opposed without instability or drainage.  S1 and S2 are regular without murmur rub or gallop Lungs: Respirations are unlabored, there is symmetric air movement no wheezes Abdomen: Abdomen is soft without any organomegaly masses or tenderness Musculoskeletal: No dependent edema Skin:    RESOLVED PROBLEM LIST   ASSESSMENT AND PLAN   77 year old diabetic day 7 status post CABG returning for transiently altered mental status following a coughing episode. Pertinent to the altered mental status he is EEG is normal and I suspect that he suffered from a syncopal episode related to the cough. He has been found incidentally to have a small cell lung cancer and has a right upper lobe collapse with likely obstruction of the right  upper lobe mainstem on CT scan.  He has an outpatient appointment with oncology. Despite the right upper lobe collapse he does not desaturate with walking and I think he is stable for discharge so long as we have appropriate follow-up arranged.  He does have an appointment with oncology for November 1  SUMMARY OF TODAY'S PLAN:   Best Practice / Goals of Care / Disposition.   DVT PROPHYLAXIS: SUP: NUTRITION: MOBILITY: LABS  Glucose Recent Labs  Lab 08/09/18 0442 08/09/18 0729 08/09/18 1159 08/09/18 1640 08/09/18 2126 08/10/18 0643  GLUCAP 140* 120* 123* 116* 116* 100*     BMET Recent Labs  Lab 08/05/18 0342 08/06/18 0303 08/09/18 0255  NA 128* 127* 133*  K 4.1 4.1 4.0  CL 93* 94* 98  CO2 25 23 25   BUN 21 18 23   CREATININE 1.50* 1.38* 1.87*  GLUCOSE 109* 117* 131*    Liver Enzymes Recent Labs  Lab 08/05/18 0342 08/09/18 0255  AST 24 21  ALT 35 29  ALKPHOS 49 52  BILITOT 0.8 0.8  ALBUMIN 2.9* 2.7*    Electrolytes Recent Labs  Lab 08/05/18 0342 08/06/18 0303 08/09/18 0255 08/09/18 0531  CALCIUM 8.0* 8.5* 8.0*  --   MG  --   --   --  1.9    CBC Recent Labs  Lab 08/04/18 0624 08/05/18 0342 08/09/18 0255  WBC 11.7* 13.0* 9.3  HGB 9.6* 9.6* 8.5*  HCT 29.4* 29.7* 27.1*  PLT 191 220 367    ABG No results for input(s): PHART, PCO2ART, PO2ART in the last 168 hours.  Coag's No results for input(s): APTT, INR in the last 168 hours.  Sepsis Markers Recent Labs  Lab 08/09/18 0255 08/09/18 0531  LATICACIDVEN 1.2 1.1    Cardiac Enzymes Recent Labs  Lab 08/09/18 0255 08/09/18 0531 08/09/18 1218  TROPONINI 0.35* 0.38* 0.37*    PAST MEDICAL HISTORY :   He  has a past medical history of Acute renal insufficiency, Anemia, unspecified, CAD (coronary artery disease), native coronary artery, Chronic combined systolic and diastolic CHF (congestive heart failure) ([place]) (12/23/2015), Colorectal polyps (2002), Diabetes mellitus, Erectile dysfunction, History of echocardiogram, cardiovascular stress test, Hyperlipidemia, Hypertension, Ischemic cardiomyopathy, Myocardial infarction ([place]), PAF (paroxysmal atrial fibrillation) ([place]), and Small cell lung cancer in adult [facility]) (07/28/2018).  PAST SURGICAL HISTORY:  He  has a past surgical history that includes [place] tooth extraction; Colonoscopy w/ polypectomy (2002); infantile paralysis facial asymmetry; Coronary stent placement (2007, 2011); Colonoscopy w/ polypectomy (2004; 2007 negative); Coronary angioplasty (12/26/2013); left heart catheterization with coronary angiogram (N/A,  10/02/2012); percutaneous coronary stent intervention (pci-s) (10/02/2012); left heart catheterization with coronary angiogram (N/A, 02/20/2013); percutaneous coronary stent intervention (pci-s) (02/20/2013); left heart catheterization with coronary angiogram (N/A, 12/26/2013); percutaneous coronary stent intervention (pci-s) (12/26/2013); left heart catheterization with coronary angiogram (N/A, 11/28/2014); LEFT HEART CATH AND CORONARY ANGIOGRAPHY (N/A, 05/18/2017); LEFT HEART CATH AND CORONARY ANGIOGRAPHY (N/A, 07/27/2018); Coronary artery bypass graft (N/A, 08/01/2018); TEE without cardioversion (N/A, 08/01/2018); Clipping of atrial appendage (Left, 08/01/2018); and biopsy (08/01/2018).  Allergies  Allergen Reactions  . Brilinta [Ticagrelor] Other (See Comments)    Arthralgias & myalgias  . Crestor [Rosuvastatin] Other (See Comments)    Myalgias    No current facility-administered medications on file prior to encounter.    Current Outpatient Medications on File Prior to Encounter  Medication Sig  . acetaminophen (TYLENOL) 325 MG tablet Take 2 tablets (650 mg total) by mouth every 6 (six) hours as needed for mild pain.  [name] Kitchen  amiodarone (PACERONE) 200 MG tablet Take 1 tablet (200 mg total) by mouth 2 (two) times daily. For one week;then take 200 mg daily thereafter  . atorvastatin (LIPITOR) 20 MG tablet Take 1 tablet (20 mg total) by mouth daily.  [name] Kitchen ELIQUIS 5 MG TABS tablet TAKE 1 TABLET BY MOUTH TWICE DAILY (Patient taking differently: Take 5 mg by mouth 2 (two) times daily. )  . fluticasone (FLONASE) 50 MCG/ACT nasal spray Place 1 spray into both nostrils 2 (two) times daily.   [name] Kitchen guaiFENesin (MUCINEX) 600 MG 12 hr tablet Take 1 tablet (600 mg total) by mouth 2 (two) times daily as needed for cough or to loosen phlegm.  . metoprolol tartrate (LOPRESSOR) 25 MG tablet Take 1 tablet (25 mg total) by mouth 2 (two) times daily.  . traMADol (ULTRAM) 50 MG tablet Take 50 mg by mouth every 4-6 hours PRN severe pain   . aspirin EC 81 MG EC tablet Take 1 tablet (81 mg total) by mouth daily.  . cholecalciferol (VITAMIN D) 1000 UNITS tablet Take 1,000 Units by mouth every evening.   . fexofenadine (ALLEGRA) 180 MG tablet Take 180 mg by mouth daily as needed for allergies.   . Multiple Vitamin (MULTIVITAMIN) tablet Take 1 tablet by mouth at bedtime.   . vitamin E 100 UNIT capsule Take 100 Units by mouth at bedtime.     FAMILY HISTORY:   His family history includes Breast cancer in his sister; Colon cancer in his brother; Heart attack (age of onset: 74) in his brother; Prostate cancer in his brother; Stroke in his sister. There is no history of Diabetes or Hypertension.  SOCIAL HISTORY:  He  reports that he quit smoking about 33 years ago. He has never used smokeless tobacco. He reports that he does not drink alcohol or use drugs.  REVIEW OF SYSTEMS:         Lars Masson, MD

## 2018-08-10 NOTE — Discharge Summary (Addendum)
Physician Discharge Summary  Patient ID: Gregory Bates MRN: [id number] DOB/AGE: 12-Dec-1940 77 y.o.  Admit date: 08/09/2018 Discharge date: 08/10/2018  Problem List Principal Problem:   Acute respiratory failure with hypoxia (HCC) Active Problems:   Type 2 diabetes mellitus with vascular disease (HCC)   Chronic diastolic CHF (congestive heart failure) (HCC)   Hyponatremia   PAF (paroxysmal atrial fibrillation) (HCC)   CKD (chronic kidney disease), stage III (HCC)   S/P CABG x 3   Small cell lung cancer in adult [facility])   Seizure-like activity ([place])  HPI:  Gregory Bates is a 77 y.o. male with history of recent admission for unstable angina underwent CABG and was discharged on October 5 3 days ago following which patient started developing diarrhea on the day of discharge and 2 days later patient became more short of breath.  Was taken to [facility] and at [facility] x-rays show revealed progressive atelectasis of the right upper lobe with known history of recent diagnosis of lung cancer.  As per the grandson who also provided the history patient was doing fine yesterday morning by evening patient became suddenly short of breath and coughing spells.  Patient's grandson also noted generalized tonic-clonic like activity concerning for seizures which lasted for 1 minute and did not have any associated incontinence of urine or tongue bite and did not lose consciousness and was not postictal.  Happened twice before going to [facility] happened once at the hospital.    In [facility] patient had a chest x-ray showing progressive atelectasis of the right upper lobe.  Patient was mildly hypotensive for which patient was given fluid bolus following which blood pressure improved.  Labs done over that showed CBC with WBC count of 12.8 hemoglobin of 9.9 platelets of 460.  INR 1.2.  ABG showed pH of 7.47 PCO2 35 PO2 58.  Plain metabolic panel showed sodium 130 potassium 3.9 carbon  dioxide 25 glucose 116 creatinine 1.9 troponin 0.6 proBNP 2100 AST 32 ALT 42.  Iinitially patient was not in any acute distress and stated that his illness of breath improved.  Shortly after my exam patient became suddenly again short of breath with right-sided chest pain.  I ordered 1 dose of morphine 1 mg IV ordered CT chest.  Chest x-ray ordered at admission during this time showed progressive atelectasis and right-sided pleural effusion.  I have also consulted pulmonary critical care.  Patient again had this tonic-clonic like activity for which I have ordered CT head and also consulted neurologist for concern for seizures.  During this tonic-clonic episode at this facility the episode lasted for less than a minute did not have any confusion after the episode and patient remembers the episode and did not have any incontinence of urine or bowel. Hospital Course:  ASSESSMENT AND PLAN   77 year old diabetic day 7 status post CABG returning for transiently altered mental status following a coughing episode. Pertinent to the altered mental status he is EEG is normal and I suspect that he suffered from a syncopal episode related to the cough. He has been found incidentally to have a small cell lung cancer and has a right upper lobe collapse with likely obstruction of the right upper lobe mainstem on CT scan.  He has an outpatient appointment with oncology. Despite the right upper lobe collapse he does not desaturate with walking and I think he is stable for discharge so long as we have appropriate follow-up arranged.  He does have an appointment  with oncology for November 1 His Keppra is been discontinued.  He has been evaluated for need for oxygen most likely will need 2 L 24/7. He does not need to follow-up with pulmonary critical care.  Has a follow-up appoint with a cardiovascular thoracic surgeon who recently did his open heart surgery.  He also has a follow-up appointment with oncology on November  1. He is being discharged home in stable condition. He will resume his home medications.  No new medications have been ordered for him.   Labs at discharge Lab Results  Component Value Date   CREATININE 1.87 (H) 08/09/2018   BUN 23 08/09/2018   NA 133 (L) 08/09/2018   K 4.0 08/09/2018   CL 98 08/09/2018   CO2 25 08/09/2018   Lab Results  Component Value Date   WBC 9.3 08/09/2018   HGB 8.5 (L) 08/09/2018   HCT 27.1 (L) 08/09/2018   MCV 91.2 08/09/2018   PLT 367 08/09/2018   Lab Results  Component Value Date   ALT 29 08/09/2018   AST 21 08/09/2018   ALKPHOS 52 08/09/2018   BILITOT 0.8 08/09/2018   Lab Results  Component Value Date   INR 1.35 08/01/2018   INR 1.31 07/26/2018   INR 1.1 10/08/2017    Current radiology studies Ct Head Wo Contrast  Result Date: 08/09/2018 CLINICAL DATA:  Altered mental status EXAM: CT HEAD WITHOUT CONTRAST TECHNIQUE: Contiguous axial images were obtained from the base of the skull through the vertex without intravenous contrast. COMPARISON:  Head CT 07/31/2009 FINDINGS: Brain: There is no hemorrhage or extra-axial collection. Unchanged appearance of calcified meningioma at the left sphenoid wing. The size and configuration of the ventricles and extra-axial CSF spaces are normal. There is no acute or chronic infarction. There is hypoattenuation of the periventricular white matter, most commonly indicating chronic ischemic microangiopathy. Vascular: Atherosclerotic calcification of the internal carotid arteries at the skull base. No abnormal hyperdensity of the major intracranial arteries or dural venous sinuses. Skull: The visualized skull base, calvarium and extracranial soft tissues are normal. Sinuses/Orbits: Left-greater-than-right maxillary sinus opacification with fluid levels. There is also a left mastoid effusion. The orbits are normal. IMPRESSION: 1. No acute intracranial abnormality. 2. Unchanged appearance of calcified left sphenoid wing  meningioma. 3. Maxillary sinus opacification with fluid levels. Correlate for symptoms of acute sinusitis. Electronically Signed   By: Ulyses Jarred M.D.   On: 08/09/2018 02:19   Ct Chest Wo Contrast  Result Date: 08/09/2018 CLINICAL DATA:  SOB. Hx small cell lung cancer, paroxysmal a-fib, HTN, diabetes, CHF, CAD, and CABG. EXAM: CT CHEST WITHOUT CONTRAST TECHNIQUE: Multidetector CT imaging of the chest was performed following the standard protocol without IV contrast. COMPARISON:  Chest CT 07/27/2018 FINDINGS: Cardiovascular: Coronary, aortic arch, and branch vessel atherosclerotic vascular disease. Mild cardiomegaly. Compared to the prior chest CT there has been interval CABG. Atrial appendage clip noted. Mediastinum/Nodes: Expected residual mediastinal edema. Trace residual pneumomediastinum, but nearly completely resolved. Small pericardial effusion, not unexpected in the postoperative setting. Pathologic mediastinal mass/adenopathy. An index AP window lymph node measures 1.7 cm in short axis on image 49/3, stable. The more bulky right paratracheal adenopathy likewise appears relatively stable. Some of this is in the vicinity of the SVC and is at least exerting extrinsic mass effect on the SVC. Lungs/Pleura: There complete obstruction of the right upper lobe bronchus with complete atelectasis of the right upper lobe. Possible bulging tumor into the right mainstem bronchus from the ostium of the  right upper lobe bronchus on image 55/4. The bronchus intermedius and its branches appear patent. Moderate right and small left pleural effusions. The right pleural effusion was previously small and the left pleural effusion is new compared to the prior CT. There is passive atelectasis associated with the pleural effusions. Mild bilateral airway thickening. Paraseptal emphysema. Upper Abdomen: High-density liver with borderline appearance for hemochromatosis. Portacaval node abnormally enlarged at 2.2 cm in short axis  on image 144/3, stable. Slight lobularity of the right adrenal gland without a well-defined mass. Musculoskeletal: Lower thoracic spondylosis. No findings of sternal dehiscence. IMPRESSION: 1. Complete atelectasis of the right upper lobe. Probable tumor in the right upper lobe bronchus. Notable mediastinal and likely right hilar adenopathy along with a stable enlarged portacaval lymph node in the upper abdomen which could be malignant. 2. Postoperative findings related to recent CABG including a small residual amount of mediastinal edema and a trace amount of residual pneumomediastinum. 3. There is a moderate right pleural effusion (formerly small on 07/27/2018) and a new small left pleural effusion. These are associated with passive atelectasis. 4. Airway thickening is present, suggesting bronchitis or reactive airways disease. 5. Other imaging findings of potential clinical significance: Aortic Atherosclerosis (ICD10-I70.0). Coronary atherosclerosis with recent CABG. Emphysema (ICD10-J43.9). High-density hepatic parenchyma suspicious for hemochromatosis. Electronically Signed   By: Van Clines M.D.   On: 08/09/2018 02:30   Mr Jeri Cos QI Contrast  Result Date: 08/09/2018 CLINICAL DATA:  Seizure.  Lung cancer. EXAM: MRI HEAD WITHOUT AND WITH CONTRAST TECHNIQUE: Multiplanar, multiecho pulse sequences of the brain and surrounding structures were obtained without and with intravenous contrast. CONTRAST:  8 mL Gadavist COMPARISON:  Head CT 08/09/2018 FINDINGS: The study is degraded by motion, despite efforts to reduce this artifact, including utilization of motion-resistant MR sequences. The findings of the study are interpreted in the context of reduced sensitivity/specificity. BRAIN: There is no acute infarct, acute hemorrhage, hydrocephalus or extra-axial collection. The midline structures are normal. No midline shift or other mass effect. 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  Multifocal white matter hyperintensity, most commonly due to chronic ischemic microangiopathy. Generalized atrophy without lobar predilection. Susceptibility-sensitive sequences show no chronic microhemorrhage or superficial siderosis. No abnormal contrast enhancement. VASCULAR: Major intracranial arterial and venous sinus flow voids are normal. SKULL AND UPPER CERVICAL SPINE: Calvarial bone marrow signal is normal. There is no skull base mass. Visualized upper cervical spine and soft tissues are normal. SINUSES/ORBITS: There are bilateral maxillary sinus fluid levels and a left mastoid effusion. The orbits are normal. IMPRESSION: 1. No acute intracranial abnormality. 2. No intracranial metastatic disease. 3. Bilateral maxillary sinus fluid levels and left mastoid effusion. Correlate for symptoms of acute sinusitis. 4. Generalized atrophy and chronic ischemic microangiopathy. Electronically Signed   By: Ulyses Jarred M.D.   On: 08/09/2018 23:14   Dg Chest Port 1 View  Result Date: 08/10/2018 CLINICAL DATA:  Shortness of breath. EXAM: PORTABLE CHEST 1 VIEW COMPARISON:  Radiographs of August 09, 2018. FINDINGS: Stable cardiomediastinal silhouette. Status post coronary artery bypass graft. Stable right upper lobe atelectasis is noted with right hilar prominence concerning for neoplasm. No pneumothorax is noted. Left lung is clear. Right basilar atelectasis or infiltrate is noted with probable associated pleural effusion. Bony thorax is unremarkable. IMPRESSION: Stable right upper lobe atelectasis is noted with right hilar prominence concerning for neoplasm. Stable right basilar atelectasis or infiltrate is noted with associated pleural effusion. Electronically Signed   By: Marijo Conception, M.D.  On: 08/10/2018 11:38   Dg Chest Port 1 View  Result Date: 08/09/2018 CLINICAL DATA:  Right lung cancer. Postoperative for CABG by 1 week. EXAM: PORTABLE CHEST 1 VIEW COMPARISON:  08/08/2018 FINDINGS: There complete  atelectasis of the right upper lobe with right hilar prominence favoring tumor. Atrial appendage clip noted.  Prior CABG. Atherosclerotic calcification of the aortic arch. Right basilar atelectasis with some resulting irregularity of the right hemidiaphragm. No overt cardiomegaly.  Thoracic spondylosis. IMPRESSION: 1. Continued complete atelectasis of the right upper lobe. Mildly increased atelectasis along the right hemidiaphragm. 2. Recent CABG without edema. 3. Atrial appendage clip. 4.  Aortic Atherosclerosis (ICD10-I70.0). Electronically Signed   By: Van Clines M.D.   On: 08/09/2018 01:19    Disposition:  Discharge disposition: 01-Home or Self Care        Allergies as of 08/10/2018      Reactions   Brilinta [ticagrelor] Other (See Comments)   Arthralgias & myalgias   Crestor [rosuvastatin] Other (See Comments)   Myalgias      Medication List    TAKE these medications   acetaminophen 325 MG tablet Commonly known as:  TYLENOL Take 2 tablets (650 mg total) by mouth every 6 (six) hours as needed for mild pain.   amiodarone 200 MG tablet Commonly known as:  PACERONE Take 1 tablet (200 mg total) by mouth 2 (two) times daily. For one week;then take 200 mg daily thereafter   aspirin 81 MG EC tablet Take 1 tablet (81 mg total) by mouth daily.   atorvastatin 20 MG tablet Commonly known as:  LIPITOR Take 1 tablet (20 mg total) by mouth daily.   cholecalciferol 1000 units tablet Commonly known as:  VITAMIN D Take 1,000 Units by mouth every evening.   ELIQUIS 5 MG Tabs tablet Generic drug:  apixaban TAKE 1 TABLET BY MOUTH TWICE DAILY What changed:  how much to take   fexofenadine 180 MG tablet Commonly known as:  ALLEGRA Take 180 mg by mouth daily as needed for allergies.   fluticasone 50 MCG/ACT nasal spray Commonly known as:  FLONASE Place 1 spray into both nostrils 2 (two) times daily.   guaiFENesin 600 MG 12 hr tablet Commonly known as:  MUCINEX Take 1  tablet (600 mg total) by mouth 2 (two) times daily as needed for cough or to loosen phlegm.   metoprolol tartrate 25 MG tablet Commonly known as:  LOPRESSOR Take 1 tablet (25 mg total) by mouth 2 (two) times daily.   multivitamin tablet Take 1 tablet by mouth at bedtime.   traMADol 50 MG tablet Commonly known as:  ULTRAM Take 50 mg by mouth every 4-6 hours PRN severe pain   vitamin E 100 UNIT capsule Take 100 Units by mouth at bedtime.            Durable Medical Equipment  (From admission, onward)         Start     Ordered   08/10/18 1210  For home use only DME oxygen  Once    Question Answer Comment  Mode or (Route) Nasal cannula   Liters per Minute 2   Oxygen conserving device Yes   Oxygen delivery system Gas      08/10/18 1210   08/10/18 1139  For home use only DME oxygen  Once    Comments:  Patient would like the [employer] concentrated oxygen and a tank for backup.  Question Answer Comment  Mode or (Route) Nasal cannula   Liters  per Minute 2   Frequency Continuous (stationary and portable oxygen unit needed)   Oxygen conserving device Yes   Oxygen delivery system Gas      08/10/18 1139            Discharged Condition: fair  Time spent on discharge 41 minutes  Vital signs at Discharge. Temp:  [98 F (36.7 C)-98.9 F (37.2 C)] 98.5 F (36.9 C) (10/09 1142) Pulse Rate:  [63-73] 71 (10/09 0900) Resp:  [11-18] 11 (10/09 0900) BP: (95-112)/(47-58) 109/49 (10/09 0900) SpO2:  [87 %-100 %] 95 % (10/09 0900) Office follow up Special Information or instructions. He will need follow-up with his cardiovascular thoracic surgeon and has an appointment to follow-up with oncology as an outpatient. Signed: Richardson Landry Aanya Haynes ACNP Maryanna Shape PCCM Pager [phone] till 1 pm If no answer page [phone] 08/10/2018, 12:11 PM

## 2018-08-10 NOTE — Care Management Note (Signed)
Case Management Note  Patient Details  Name: Gregory Bates MRN: [id number] Date of Birth: 1941/09/17  Subjective/Objective:  77 y.o.malepresented with worsening SOB. Patient has a history of recent admission for unstable angina s/p CABG and diagnosis of lung cancer.                Action/Plan: CM consult acknowledged for DME needs. CM met with patient and spouse to discuss transitional needs. Patient lives at home with spouse, independent with ADLs, uses a 3n1 and rollator. MD order for home O2 with qualifying O2 sats documented in Epic; CM provided DME preference list with [facility] selected to arrange home O2. DME referral given to [facility] liaison; AVS and primary nurse updated. CM left VM with [facility] coordinator to updated on discharge order for community f/u post transition.   Expected Discharge Date:  08/10/18               Expected Discharge Plan:  Home/Self Care  In-House Referral:  NA  Discharge planning Services  CM Consult  Post Acute Care Choice:  Durable Medical Equipment Choice offered to:  Patient  DME Arranged:  Oxygen DME Agency:  [place]:  NA [place] Agency:  NA  Status of Service:  Completed, signed off  If discussed at [place] of Stay Meetings, dates discussed:    Additional Comments:  Midge Minium RN, BSN, NCM-BC, ACM-RN [phone] 08/10/2018, 12:45 PM

## 2018-08-10 NOTE — Progress Notes (Signed)
SATURATION QUALIFICATIONS: (This note is used to comply with regulatory documentation for home oxygen)  Patient Saturations on Room Air at Rest = 82%  Patient Saturations on Room Air while Ambulating = 73%  Patient Saturations on 2 Liters of oxygen while Ambulating = 98%  Please briefly explain why patient needs home oxygen: newly diagnosed small cell lung cancer.

## 2018-08-10 NOTE — Progress Notes (Signed)
Discharge instructions reviewed w/ pt and spouse. All questions answered. PIV x 2 removed. Pt taken out in wheelchair by this RN.

## 2018-08-10 NOTE — Progress Notes (Signed)
Oncology Nurse Navigator Documentation  Oncology Nurse Navigator Flowsheets 08/10/2018  Navigator Location [facility]  Navigator Encounter Type Other/I received a call from Dr. Servando Snare, he updated me on patient status and discharge plans.  I called the nurse taking care of patient.  I updated her on appt with med onc tomorrow. She will update patient.   Treatment Phase Pre-Tx/Tx Discussion  Barriers/Navigation Needs Coordination of Care  Interventions Coordination of Care  Coordination of Care Other  Acuity Level 2  Time Spent with Patient 30

## 2018-08-10 NOTE — Progress Notes (Addendum)
Subjective: No further episodes overnight.  Talk to grandson who witnessed the event and explained that most likely this was asterixis secondary to lack of oxygen- he felt that was more of a good description of what happened.  Exam: Vitals:   08/10/18 0800 08/10/18 0900  BP: (!) 100/54 (!) 109/49  Pulse: 71 71  Resp: 15 11  Temp:    SpO2: 90% 95%    Physical Exam   HEENT-  Normocephalic, no lesions, left facial droop.  Normal external eye and conjunctiva.   Cardiovascular- S1-S2 audible, pulses palpable throughout   Lungs-no rhonchi or wheezing noted, no excessive working breathing.  Saturations within normal limits Abdomen- All 4 quadrants palpated and nontender Extremities- Warm, dry and intact Musculoskeletal-no joint tenderness, deformity or swelling Skin-warm and dry, no hyperpigmentation, vitiligo, or suspicious lesions    Neuro:  Mental Status: Alert, oriented, thought content appropriate.  Speech fluent without evidence of aphasia.  Able to follow 3 step commands without difficulty. Cranial Nerves: II:  Visual fields grossly normal,  III,IV, VI: ptosis not present, extra-ocular motions intact bilaterally pupils equal, round, reactive to light and accommodation V,VII: Left facial droop secondary to polio, facial light touch sensation normal bilaterally VIII: hearing normal bilaterally IX,X: uvula rises midline XI: bilateral shoulder shrug XII: midline tongue extension Motor: Right : Upper extremity   5/5    Left:     Upper extremity   5/5  Lower extremity   5/5     Lower extremity   5/5 No asterixis noted when hands and arms are held out straight Sensory: Pinprick and light touch intact throughout, bilaterally Deep Tendon Reflexes: 2+ and symmetric throughout Plantars: Right: downgoing   Left: downgoing     Medications:  Prior to Admission:  Medications Prior to Admission  Medication Sig Dispense Refill Last Dose  . acetaminophen (TYLENOL) 325 MG tablet Take  2 tablets (650 mg total) by mouth every 6 (six) hours as needed for mild pain.   UNK at PRN  . amiodarone (PACERONE) 200 MG tablet Take 1 tablet (200 mg total) by mouth 2 (two) times daily. For one week;then take 200 mg daily thereafter 90 tablet 3 UNK  . atorvastatin (LIPITOR) 20 MG tablet Take 1 tablet (20 mg total) by mouth daily. 90 tablet 3 UNK  . ELIQUIS 5 MG TABS tablet TAKE 1 TABLET BY MOUTH TWICE DAILY (Patient taking differently: Take 5 mg by mouth 2 (two) times daily. ) 180 tablet 3 UNK  . fluticasone (FLONASE) 50 MCG/ACT nasal spray Place 1 spray into both nostrils 2 (two) times daily.    UNK  . guaiFENesin (MUCINEX) 600 MG 12 hr tablet Take 1 tablet (600 mg total) by mouth 2 (two) times daily as needed for cough or to loosen phlegm.   UNK at PRN  . metoprolol tartrate (LOPRESSOR) 25 MG tablet Take 1 tablet (25 mg total) by mouth 2 (two) times daily. 180 tablet 3 UNK  . traMADol (ULTRAM) 50 MG tablet Take 50 mg by mouth every 4-6 hours PRN severe pain 30 tablet 0 UNK at PRN  . aspirin EC 81 MG EC tablet Take 1 tablet (81 mg total) by mouth daily.   UNK  . cholecalciferol (VITAMIN D) 1000 UNITS tablet Take 1,000 Units by mouth every evening.    UNK  . fexofenadine (ALLEGRA) 180 MG tablet Take 180 mg by mouth daily as needed for allergies.    UNK at PRN  . Multiple Vitamin (MULTIVITAMIN) tablet Take 1  tablet by mouth at bedtime.    UNK  . vitamin E 100 UNIT capsule Take 100 Units by mouth at bedtime.    UNK   Scheduled: . amiodarone  200 mg Oral BID  . atorvastatin  20 mg Oral Daily  . insulin aspart  0-9 Units Subcutaneous TID WC  . levETIRAcetam  500 mg Oral BID  . metoprolol tartrate  25 mg Oral BID    Pertinent Labs/Diagnostics: EEG: Normal electroencephalogram, awake and drowsy.  There are no focal lateralizing or epileptiform features. Ammonia was 17 Magnesium 1.9 Sodium 133 BUN 23 Creatinine slowly becoming elevated at this point 1.87 Albumin 2.7  Ct Head Wo  Contrast  Result Date: 08/09/2018 . IMPRESSION: 1. No acute intracranial abnormality. 2. Unchanged appearance of calcified left sphenoid wing meningioma. 3. Maxillary sinus opacification with fluid levels. Correlate for symptoms of acute sinusitis. Electronically Signed   By: Ulyses Jarred M.D.   On: 08/09/2018 02:19    Mr Jeri Cos EL Contrast  Result Date: 08/09/2018  IMPRESSION: 1. No acute intracranial abnormality. 2. No intracranial metastatic disease. 3. Bilateral maxillary sinus fluid levels and left mastoid effusion. Correlate for symptoms of acute sinusitis. 4. Generalized atrophy and chronic ischemic microangiopathy. Electronically Signed   By: Ulyses Jarred M.D.   On: 08/09/2018 23:14   Etta Quill PA-C [facility] [phone]   Assessment: At this time given further history most likely this was asterixis which is negative mild clonus.  Impression:  -Likely asterixis, no convincing story and/or test to support seizures.   Recommendations: - DISCONTINUE Keppra at this time. Consider again if convincing seizure activity is noted. - At this point time I do not see any reason for any further neurological work-up  08/10/2018, 9:20 AM  Attending Neurohospitalist Addendum Patient seen and examined with APP/Resident. Agree with the history and physical as documented above. Agree with the plan as documented, which I helped formulate. I have independently reviewed the chart, obtained history, review of systems and examined the patient.I have personally reviewed pertinent head/neck/spine imaging (CT/MRI). EEG normal. MRI with no acute changes or lesions to explain seizure. Please feel free to call with any questions. --- Amie Portland, MD [facility] Pager: [phone]  If 7pm to 7am, please call on call as listed on AMION.

## 2018-08-10 NOTE — Progress Notes (Signed)
      [address]       [place] [address]             [phone]         Subjective: Feels okay this morning and ready for home.   Objective: Vital signs in last 24 hours: Temp:  [98 F (36.7 C)-98.9 F (37.2 C)] 98.9 F (37.2 C) (10/09 0700) Pulse Rate:  [63-73] 71 (10/09 0900) Cardiac Rhythm: Normal sinus rhythm (10/09 0715) Resp:  [11-18] 11 (10/09 0900) BP: (92-112)/(47-72) 109/49 (10/09 0900) SpO2:  [87 %-100 %] 95 % (10/09 0900)     Intake/Output from previous day: 10/08 0701 - 10/09 0700 In: 1280 [P.O.:1280] Out: 1050 [Urine:1050] Intake/Output this shift: Total I/O In: 360 [P.O.:360] Out: -   General appearance: alert and appears stated age Neurologic: grossly intact Heart: regular rate and rhythm, S1, S2 normal, no murmur, click, rub or gallop Lungs: clear to auscultation bilaterally Abdomen: soft, non-tender; bowel sounds normal; no masses,  no organomegaly Extremities: extremities normal, atraumatic, no cyanosis or edema Wound: clean and dry-small amount of drainage from the distal portion of the incision  Lab Results: Recent Labs    08/09/18 0255  WBC 9.3  HGB 8.5*  HCT 27.1*  PLT 367   BMET:  Recent Labs    08/09/18 0255  NA 133*  K 4.0  CL 98  CO2 25  GLUCOSE 131*  BUN 23  CREATININE 1.87*  CALCIUM 8.0*    PT/INR: No results for input(s): LABPROT, INR in the last 72 hours. ABG    Component Value Date/Time   PHART 7.411 08/02/2018 0119   HCO3 22.9 08/02/2018 0119   TCO2 22 08/02/2018 1634   ACIDBASEDEF 1.0 08/02/2018 0119   O2SAT 59.6 08/03/2018 0450   CBG (last 3)  Recent Labs    08/09/18 1640 08/09/18 2126 08/10/18 0643  GLUCAP 116* 116* 100*    Assessment/Plan: S/P CABG x 3  1. Remains in NSR in the 60s. BP stable. Continue Amio 200mg  BID. Eliquis at discharge. 2. Pulm-productive cough. On Tussionex. CRX this morning stable-await official read. Will need oxygen for home.  3. Renal-creatinine  1.87, no new labs today, electrolytes okay. 4. H and H 8.5/27.1, acute blood loss anemia-will trend 5. Hyponatremia-133 yesterday. Was on salt tabs at home.  6. Neurologic-seizure-like shakes when coughing. On Keppra 500mg  BID. Neuro following. Mri of the brain showed no acute abnormality, no intracranial metastatic disease, bilateral maxillary sinus fluid level and left mastoid effusion-correlate for symptoms of acute sinusitis, and generalized atrophy and chronic ischemic microangiopathy. 7. Endo-blood glucose level well controlled   Plan: Keep original post-op appointment with our office. Recommend home oxygen. Need 6 minute walk test documentation.       LOS: 1 day    Gregory Bates 08/10/2018

## 2018-08-10 NOTE — Consult Note (Signed)
   [facility] CM Inpatient Consult   08/10/2018  Gregory Bates [birth year] [id number]   Met with the patient and wife at the bedside.  Patient has received home oxygen. Patient consents to ongoing follow up with [facility]. Reminded about the 24 hour nurse advise line.    Natividad Brood, RN BSN [facility] Liaison  [phone] business mobile phone Toll free office [phone]

## 2018-08-11 ENCOUNTER — Ambulatory Visit
Admission: RE | Admit: 2018-08-11 | Discharge: 2018-08-11 | Disposition: A | Discharge status: Home / Self Care | Payer: BLUE CROSS/BLUE SHIELD | Source: Ambulatory Visit | Attending: Radiation Oncology | Admitting: Radiation Oncology

## 2018-08-11 ENCOUNTER — Inpatient Hospital Stay: Payer: BLUE CROSS/BLUE SHIELD | Attending: Internal Medicine | Admitting: Internal Medicine

## 2018-08-11 ENCOUNTER — Encounter: Payer: Self-pay | Admitting: Internal Medicine

## 2018-08-11 ENCOUNTER — Telehealth: Payer: Self-pay | Admitting: *Deleted

## 2018-08-11 ENCOUNTER — Encounter: Payer: Self-pay | Admitting: Radiation Oncology

## 2018-08-11 VITALS — BP 108/55 | HR 64 | Temp 97.6°F | Resp 17 | Ht 71.0 in | Wt 197.6 lb

## 2018-08-11 DIAGNOSIS — R590 Localized enlarged lymph nodes: Secondary | ICD-10-CM

## 2018-08-11 DIAGNOSIS — Z5112 Encounter for antineoplastic immunotherapy: Secondary | ICD-10-CM

## 2018-08-11 DIAGNOSIS — R531 Weakness: Secondary | ICD-10-CM

## 2018-08-11 DIAGNOSIS — Z8042 Family history of malignant neoplasm of prostate: Secondary | ICD-10-CM | POA: Insufficient documentation

## 2018-08-11 DIAGNOSIS — E785 Hyperlipidemia, unspecified: Secondary | ICD-10-CM

## 2018-08-11 DIAGNOSIS — Z79899 Other long term (current) drug therapy: Secondary | ICD-10-CM

## 2018-08-11 DIAGNOSIS — N2889 Other specified disorders of kidney and ureter: Secondary | ICD-10-CM | POA: Diagnosis not present

## 2018-08-11 DIAGNOSIS — C3490 Malignant neoplasm of unspecified part of unspecified bronchus or lung: Secondary | ICD-10-CM

## 2018-08-11 DIAGNOSIS — C3411 Malignant neoplasm of upper lobe, right bronchus or lung: Secondary | ICD-10-CM

## 2018-08-11 DIAGNOSIS — I252 Old myocardial infarction: Secondary | ICD-10-CM

## 2018-08-11 DIAGNOSIS — Z87891 Personal history of nicotine dependence: Secondary | ICD-10-CM | POA: Insufficient documentation

## 2018-08-11 DIAGNOSIS — C3491 Malignant neoplasm of unspecified part of right bronchus or lung: Secondary | ICD-10-CM

## 2018-08-11 DIAGNOSIS — R5382 Chronic fatigue, unspecified: Secondary | ICD-10-CM

## 2018-08-11 DIAGNOSIS — J90 Pleural effusion, not elsewhere classified: Secondary | ICD-10-CM | POA: Insufficient documentation

## 2018-08-11 DIAGNOSIS — I2510 Atherosclerotic heart disease of native coronary artery without angina pectoris: Secondary | ICD-10-CM

## 2018-08-11 DIAGNOSIS — Z803 Family history of malignant neoplasm of breast: Secondary | ICD-10-CM

## 2018-08-11 DIAGNOSIS — I5032 Chronic diastolic (congestive) heart failure: Secondary | ICD-10-CM

## 2018-08-11 DIAGNOSIS — I672 Cerebral atherosclerosis: Secondary | ICD-10-CM | POA: Insufficient documentation

## 2018-08-11 DIAGNOSIS — Z5111 Encounter for antineoplastic chemotherapy: Secondary | ICD-10-CM

## 2018-08-11 DIAGNOSIS — N529 Male erectile dysfunction, unspecified: Secondary | ICD-10-CM | POA: Insufficient documentation

## 2018-08-11 DIAGNOSIS — Z7189 Other specified counseling: Secondary | ICD-10-CM

## 2018-08-11 DIAGNOSIS — I255 Ischemic cardiomyopathy: Secondary | ICD-10-CM | POA: Insufficient documentation

## 2018-08-11 DIAGNOSIS — I10 Essential (primary) hypertension: Secondary | ICD-10-CM

## 2018-08-11 DIAGNOSIS — Z951 Presence of aortocoronary bypass graft: Secondary | ICD-10-CM

## 2018-08-11 DIAGNOSIS — R5383 Other fatigue: Secondary | ICD-10-CM

## 2018-08-11 DIAGNOSIS — J439 Emphysema, unspecified: Secondary | ICD-10-CM

## 2018-08-11 DIAGNOSIS — I480 Paroxysmal atrial fibrillation: Secondary | ICD-10-CM | POA: Insufficient documentation

## 2018-08-11 DIAGNOSIS — E119 Type 2 diabetes mellitus without complications: Secondary | ICD-10-CM

## 2018-08-11 DIAGNOSIS — D649 Anemia, unspecified: Secondary | ICD-10-CM | POA: Insufficient documentation

## 2018-08-11 DIAGNOSIS — I700 Atherosclerosis of aorta: Secondary | ICD-10-CM

## 2018-08-11 DIAGNOSIS — Z800 Family history of malignant neoplasm of digestive organs: Secondary | ICD-10-CM | POA: Insufficient documentation

## 2018-08-11 MED ORDER — PROCHLORPERAZINE MALEATE 10 MG PO TABS: 10.0000 mg | ORAL_TABLET | Freq: Four times a day (QID) | ORAL | 0 refills | Status: DC | PRN

## 2018-08-11 NOTE — Progress Notes (Signed)
Radiation Oncology         [phone] ________________________________  Name: Gregory Bates        MRN: [id number]  Date of Service: 08/11/2018 DOB: Aug 26, 1941  WU:[id number], Sharon Mt, MD  Grace Isaac, MD     REFERRING PHYSICIAN: Grace Isaac, MD   DIAGNOSIS: The primary encounter diagnosis was Small cell lung cancer in adult [facility]). A diagnosis of Small cell carcinoma of right lung (HCC) was also pertinent to this visit.   HISTORY OF PRESENT ILLNESS: Gregory Bates is a 77 y.o. male seen at the request of Dr. Servando Snare for a new diagnosis of right upper lobe lung cancer. He has a history of CAD and had multiple cardiac stents. During a recent work up for chest pain, he had a CXR that revealed concerns with an abnormality in the right upper lobe. A CT scan of the chest on 07/27/2018 revealed an area in the RUL that measured 3.5 x 3.4 x 3.2 cm, opacities in the major fissure and posterior segment of right upper lobe measured up to 6 mm were new. A partially cavitary nodule in the right middle lobe was also identified up to 8 mm as well as a small sub-3 mm nodule.  Right-sided effusion was present with subpleural areas of streaky interstitial change.  Hilar adenopathy was also limited however there was at least right-sided adenopathy measuring 1.2 cm.  Mediastinal adenopathy was also noted the largest lesion measuring 2.3 cm.  He had left main and three-vessel coronary atherosclerotic changes.  He continued to have symptoms and per proceeded with coronary vessel bypass on 08/01/2018 with Dr. Servando Snare.  At the time of the procedure a biopsy of the right upper lobe was performed, and revealed small cell carcinoma within a pericardial lymph node, right hilum, and a benign lymph node in the left internal mammary region.  He has been recovering from surgery, and is scheduled to undergo a PET scan on 08/20/2018.  He did also undergo imaging of the brain on 08/09/2018 the MRI revealed no acute, or  disease related finding.  He comes today to discuss options of treatment as it appears he has limited stage disease.    PREVIOUS RADIATION THERAPY: No   PAST MEDICAL HISTORY:  Past Medical History:  Diagnosis Date  . Acute renal insufficiency    a. During 11/2013 adm with atrial flutter.  . Anemia, unspecified   . CAD (coronary artery disease), native coronary artery    a. s/p cath May 2011 >> DES distal RCA;  b. 01/2013 NSTEMI >> OM1 99p (2.75x12 Promus Premier DES);  c. LHC (2/15): PCI: Promus DES to dist RCA ;  d. LHC 1/16 - dLM 45, oLAD 30, small D1 tandem 75, pOM1 stent ok, mOM 50-60, dOM bifurcation 50-60 in both vessels, AV 9-70 jailed by stent, pRCA 26 mRCA stent ok, dRCA stent ok, PLB smal 50, dPDA 60, 80; EF 35-40 >> med Rx - PCI of PDA if angina  . Chronic combined systolic and diastolic CHF (congestive heart failure) ([place]) 12/23/2015   Echo 2/17: Mild LVH, EF 45-50%, inferior akinesis, grade 2 diastolic dysfunction, mild AI, mild MR, mild LAE, PASP 44 mmHg   . Colorectal polyps 2002  . Diabetes mellitus    AODM  . Erectile dysfunction   . History of echocardiogram    a. 01/2013 Echo: EF 55%, mid-dist inflat HK, Gr1 DD, Triv AI/TR, Mild MR.;  b.  Echo (1/15): Mild LVH, EF 55%, inferolateral  hypokinesis, grade 1 diastolic dysfunction, mild AI, trivial MR, moderate LAE, PASP 36 mmHg   . Hx of cardiovascular stress test    Stress myoview 08/07/13 with LVEF 44%, inferior scar, no ischemia  . Hyperlipidemia   . Hypertension   . Ischemic cardiomyopathy    a. EF 35-40% by LHC in 1/16; b. Echo 2/17: Mild LVH, EF 45-50%, inferior akinesis, grade 2 diastolic dysfunction, mild AI, mild MR, mild LAE, PASP 44 mmHg  . Myocardial infarction ([place])   . PAF (paroxysmal atrial fibrillation) ([place])    a. Dx 11/2013. Spont conv. Placed on eliquis.  . Small cell lung cancer in adult [facility]) 07/28/2018       PAST SURGICAL HISTORY: Past Surgical History:  Procedure Laterality Date  . BIOPSY   08/01/2018   Procedure: BIOPSY OF PERICARDIAL LYMPH NODE AND HILAR LYMPH NODE;  Surgeon: Grace Isaac, MD;  Location: [place];  Service: Open Heart Surgery;;  . CLIPPING OF ATRIAL APPENDAGE Left 08/01/2018   Procedure: CLIPPING OF ATRIAL APPENDAGE;  Surgeon: Grace Isaac, MD;  Location: [place];  Service: Open Heart Surgery;  Laterality: Left;  . COLONOSCOPY W/ POLYPECTOMY  2002   [facility]  . COLONOSCOPY W/ POLYPECTOMY  2004; 2007 negative  . CORONARY ANGIOPLASTY  12/26/2013  . CORONARY ARTERY BYPASS GRAFT N/A 08/01/2018   Procedure: CORONARY ARTERY BYPASS GRAFTING (CABG) TIMES THREE USING LEFT INTERNAL MAMMARY ARTERY TO LAD, RIGHT GREATER SAPHENOUS VEIN GRAFT TO OM AND RCA, VEIN HARVESTED ENDOSCOPICALLY;  Surgeon: Grace Isaac, MD;  Location: [place];  Service: Open Heart Surgery;  Laterality: N/A;  . CORONARY STENT PLACEMENT  2007, 2011  . infantile paralysis facial asymmetry    . LEFT HEART CATH AND CORONARY ANGIOGRAPHY N/A 05/18/2017   Procedure: Left Heart Cath and Coronary Angiography;  Surgeon: Martinique, Peter M, MD;  Location: [place] CV LAB;  Service: Cardiovascular;  Laterality: N/A;  . LEFT HEART CATH AND CORONARY ANGIOGRAPHY N/A 07/27/2018   Procedure: LEFT HEART CATH AND CORONARY ANGIOGRAPHY;  Surgeon: Martinique, Peter M, MD;  Location: [place] CV LAB;  Service: Cardiovascular;  Laterality: N/A;  . LEFT HEART CATHETERIZATION WITH CORONARY ANGIOGRAM N/A 10/02/2012   Procedure: LEFT HEART CATHETERIZATION WITH CORONARY ANGIOGRAM;  Surgeon: Peter M Martinique, MD;  Location: [facility] CATH LAB;  Service: Cardiovascular;  Laterality: N/A;  . LEFT HEART CATHETERIZATION WITH CORONARY ANGIOGRAM N/A 02/20/2013   Procedure: LEFT HEART CATHETERIZATION WITH CORONARY ANGIOGRAM;  Surgeon: Burnell Blanks, MD;  Location: [facility] CATH LAB;  Service: Cardiovascular;  Laterality: N/A;  . LEFT HEART CATHETERIZATION WITH CORONARY ANGIOGRAM N/A 12/26/2013   Procedure: LEFT HEART CATHETERIZATION WITH  CORONARY ANGIOGRAM;  Surgeon: Burnell Blanks, MD;  Location: [facility] CATH LAB;  Service: Cardiovascular;  Laterality: N/A;  . LEFT HEART CATHETERIZATION WITH CORONARY ANGIOGRAM N/A 11/28/2014   Procedure: LEFT HEART CATHETERIZATION WITH CORONARY ANGIOGRAM;  Surgeon: Burnell Blanks, MD;  Location: [facility] CATH LAB;  Service: Cardiovascular;  Laterality: N/A;  . PERCUTANEOUS CORONARY STENT INTERVENTION (PCI-S)  10/02/2012   Procedure: PERCUTANEOUS CORONARY STENT INTERVENTION (PCI-S);  Surgeon: Peter M Martinique, MD;  Location: [facility] CATH LAB;  Service: Cardiovascular;;  . PERCUTANEOUS CORONARY STENT INTERVENTION (PCI-S)  02/20/2013   Procedure: PERCUTANEOUS CORONARY STENT INTERVENTION (PCI-S);  Surgeon: Burnell Blanks, MD;  Location: [facility] CATH LAB;  Service: Cardiovascular;;  . PERCUTANEOUS CORONARY STENT INTERVENTION (PCI-S)  12/26/2013   Procedure: PERCUTANEOUS CORONARY STENT INTERVENTION (PCI-S);  Surgeon: Burnell Blanks, MD;  Location: [facility] CATH  LAB;  Service: Cardiovascular;;  . TEE WITHOUT CARDIOVERSION N/A 08/01/2018   Procedure: TRANSESOPHAGEAL ECHOCARDIOGRAM (TEE);  Surgeon: Grace Isaac, MD;  Location: [place];  Service: Open Heart Surgery;  Laterality: N/A;  . WISDOM TOOTH EXTRACTION       FAMILY HISTORY:  Family History  Problem Relation Age of Onset  . Heart attack Brother 77  . Colon cancer Brother   . Prostate cancer Brother   . Breast cancer Sister   . Stroke Sister   . Diabetes Neg Hx   . Hypertension Neg Hx      SOCIAL HISTORY:  reports that he quit smoking about 33 years ago. He has never used smokeless tobacco. He reports that he does not drink alcohol or use drugs.   ALLERGIES: Brilinta [ticagrelor] and Crestor [rosuvastatin]   MEDICATIONS:  Current Outpatient Medications  Medication Sig Dispense Refill  . acetaminophen (TYLENOL) 325 MG tablet Take 2 tablets (650 mg total) by mouth every 6 (six) hours as needed for mild pain.    [name] Kitchen amiodarone  (PACERONE) 200 MG tablet Take 1 tablet (200 mg total) by mouth 2 (two) times daily. For one week;then take 200 mg daily thereafter 90 tablet 3  . aspirin EC 81 MG EC tablet Take 1 tablet (81 mg total) by mouth daily.    [name] Kitchen atorvastatin (LIPITOR) 20 MG tablet Take 1 tablet (20 mg total) by mouth daily. 90 tablet 3  . cholecalciferol (VITAMIN D) 1000 UNITS tablet Take 1,000 Units by mouth every evening.     [name] Kitchen ELIQUIS 5 MG TABS tablet TAKE 1 TABLET BY MOUTH TWICE DAILY (Patient taking differently: Take 5 mg by mouth 2 (two) times daily. ) 180 tablet 3  . fexofenadine (ALLEGRA) 180 MG tablet Take 180 mg by mouth daily as needed for allergies.     . fluticasone (FLONASE) 50 MCG/ACT nasal spray Place 1 spray into both nostrils 2 (two) times daily.     [name] Kitchen guaiFENesin (MUCINEX) 600 MG 12 hr tablet Take 1 tablet (600 mg total) by mouth 2 (two) times daily as needed for cough or to loosen phlegm.    . metoprolol tartrate (LOPRESSOR) 25 MG tablet Take 1 tablet (25 mg total) by mouth 2 (two) times daily. 180 tablet 3  . Multiple Vitamin (MULTIVITAMIN) tablet Take 1 tablet by mouth at bedtime.     . prochlorperazine (COMPAZINE) 10 MG tablet Take 1 tablet (10 mg total) by mouth every 6 (six) hours as needed for nausea or vomiting. 30 tablet 0  . traMADol (ULTRAM) 50 MG tablet Take 50 mg by mouth every 4-6 hours PRN severe pain 30 tablet 0  . vitamin E 100 UNIT capsule Take 100 Units by mouth at bedtime.      No current facility-administered medications for this encounter.      REVIEW OF SYSTEMS: On review of systems, the patient reports that in the last 2 weeks, he has had increasing shortness of breath requiring oxygen.  He feels that he is almost unable to go without it.  He reports that he feels as though his ability to lay flat and breathe is even worse.  He denies any fevers or chills, chest pain in the left side of his chest, but does state he has a sharp pain when he tries to catch his breath in the right  part of his chest.     PHYSICAL EXAM:  Wt Readings from Last 3 Encounters:  08/11/18 197 lb 9.6 oz (89.6 kg)  08/09/18 178 lb 14.4 oz (81.1 kg)  08/06/18 191 lb 3.2 oz (86.7 kg)   Temp Readings from Last 3 Encounters:  08/11/18 97.6 F (36.4 C) (Oral)  08/10/18 98.5 F (36.9 C) (Oral)  08/05/18 97.9 F (36.6 C) (Oral)   BP Readings from Last 3 Encounters:  08/11/18 (!) 108/55  08/10/18 (!) 92/53  08/06/18 117/88   Pulse Readings from Last 3 Encounters:  08/11/18 64  08/10/18 65  08/06/18 85     In general this is a chronically ill-appearing Caucasian male in no acute distress.  Is alert and oriented x4 and appropriate throughout the examination. HEENT reveals that the patient is normocephalic, atraumatic. EOMs are intact.  Skin is intact without any evidence of gross lesions.  His sternotomy incision is healing well without evidence of erythema.  Cardiovascular exam reveals a regular rate and rhythm, no clicks rubs or murmurs are auscultated.  Saltation of the chest does reveal crackles and rhonchi throughout the precordium, and posteriorly in all of the left lung fields however this appears to be due to the changes in the right lung, it is much more audible over the right upper lobe that very little air is moving, and decreased breath sounds are noted from the scapula down on the right.. Abdomen has active bowel sounds in all quadrants and is intact. The abdomen is soft, non tender, non distended. Lower extremities are negative for pretibial pitting edema, deep calf tenderness, cyanosis or clubbing.   ECOG = 3  0 - Asymptomatic (Fully active, able to carry on all predisease activities without restriction)  1 - Symptomatic but completely ambulatory (Restricted in physically strenuous activity but ambulatory and able to carry out work of a light or sedentary nature. For example, light housework, office work)  2 - Symptomatic, <50% in bed during the day (Ambulatory and capable of  all self care but unable to carry out any work activities. Up and about more than 50% of waking hours)  3 - Symptomatic, >50% in bed, but not bedbound (Capable of only limited self-care, confined to bed or chair 50% or more of waking hours)  4 - Bedbound (Completely disabled. Cannot carry on any self-care. Totally confined to bed or chair)  5 - Death   Eustace Pen MM, Creech RH, Tormey DC, et al. [phone]). "Toxicity and response criteria of the [facility]". [place] Oncol. 5 (6): 649-55    LABORATORY DATA:  Lab Results  Component Value Date   WBC 9.3 08/09/2018   HGB 8.5 (L) 08/09/2018   HCT 27.1 (L) 08/09/2018   MCV 91.2 08/09/2018   PLT 367 08/09/2018   Lab Results  Component Value Date   NA 133 (L) 08/09/2018   K 4.0 08/09/2018   CL 98 08/09/2018   CO2 25 08/09/2018   Lab Results  Component Value Date   ALT 29 08/09/2018   AST 21 08/09/2018   ALKPHOS 52 08/09/2018   BILITOT 0.8 08/09/2018      RADIOGRAPHY: Dg Chest 2 View  Result Date: 08/05/2018 CLINICAL DATA:  CABG. EXAM: CHEST - 2 VIEW COMPARISON:  08/03/2018. FINDINGS: Interim removal of right IJ sheath. Prior CABG. Left atrial appendage clip noted in stable position. Stable cardiomegaly. Right upper lobe atelectatic changes, progressed from prior exam. Small bilateral pleural effusions, progressed from prior exam. No pneumothorax. IMPRESSION: 1.  Interim removal of right IJ sheath.  No pneumothorax. 2. Prior CABG. Left atrial appendage clip in stable position. Stable cardiomegaly. 3.  Progressive right upper lobe atelectasis. Slight progression of bilateral small pleural effusions. Electronically Signed   By: Marcello Moores  Register   On: 08/05/2018 08:23   Dg Chest 2 View  Result Date: 07/26/2018 CLINICAL DATA:  Chest pain EXAM: CHEST - 2 VIEW COMPARISON:  07/24/2018 chest radiograph. FINDINGS: Normal heart size. Prominence of the right paratracheal stripe/right superior hilar contour. Aortic  atherosclerosis. No pneumothorax. No pleural effusion. Hyperinflated lungs. IMPRESSION: Prominence of the right paratracheal stripe/right suprahilar contour. Recommend chest CT with IV contrast to exclude adenopathy/lung mass. Hyperinflated lungs, suggesting COPD. Electronically Signed   By: Ilona Sorrel M.D.   On: 07/26/2018 01:14   Dg Chest 2 View  Result Date: 07/24/2018 CLINICAL DATA:  Pt reports cough x yesterday and left sided chest pain for over 1 hour; Hx of MI w/ 5 stents, DM, and HTN; former smoker EXAM: CHEST - 2 VIEW COMPARISON:  07/19/2018 FINDINGS: Cardiac silhouette is normal in size and configuration no mediastinal or hilar masses. No evidence of adenopathy. Clear lungs.  No pleural effusion or pneumothorax. Skeletal structures are demineralized but intact. IMPRESSION: No active cardiopulmonary disease. Electronically Signed   By: Lajean Manes M.D.   On: 07/24/2018 12:17   Ct Head Wo Contrast  Result Date: 08/09/2018 CLINICAL DATA:  Altered mental status EXAM: CT HEAD WITHOUT CONTRAST TECHNIQUE: Contiguous axial images were obtained from the base of the skull through the vertex without intravenous contrast. COMPARISON:  Head CT 07/31/2009 FINDINGS: Brain: There is no hemorrhage or extra-axial collection. Unchanged appearance of calcified meningioma at the left sphenoid wing. The size and configuration of the ventricles and extra-axial CSF spaces are normal. There is no acute or chronic infarction. There is hypoattenuation of the periventricular white matter, most commonly indicating chronic ischemic microangiopathy. Vascular: Atherosclerotic calcification of the internal carotid arteries at the skull base. No abnormal hyperdensity of the major intracranial arteries or dural venous sinuses. Skull: The visualized skull base, calvarium and extracranial soft tissues are normal. Sinuses/Orbits: Left-greater-than-right maxillary sinus opacification with fluid levels. There is also a left mastoid  effusion. The orbits are normal. IMPRESSION: 1. No acute intracranial abnormality. 2. Unchanged appearance of calcified left sphenoid wing meningioma. 3. Maxillary sinus opacification with fluid levels. Correlate for symptoms of acute sinusitis. Electronically Signed   By: Ulyses Jarred M.D.   On: 08/09/2018 02:19   Ct Head Wo Contrast  Result Date: 07/30/2018 CLINICAL DATA:  Post heart catheterization now with subacute neurologic defects. EXAM: CT HEAD WITHOUT CONTRAST TECHNIQUE: Contiguous axial images were obtained from the base of the skull through the vertex without intravenous contrast. COMPARISON:  11/02/2017 FINDINGS: Brain: Similar findings of atrophy with centralized volume loss and mild commensurate ex vacuo dilatation the ventricular system. Re demonstrated scattered periventricular hypodensities, most conspicuous about the left centrum semiovale compatible with microvascular ischemic disease. Gray-white differentiation is otherwise well maintained without CT evidence of superimposed acute large territory infarct. Known approximately 2.1 x 1 cm meningioma involving the left sphenoid wing is unchanged compared to the 11/2017 examination (coronal image 27, series 5). No intraparenchymal or extra-axial hemorrhage. Unchanged size and configuration of the ventricles and the basilar cisterns. No midline shift. Vascular: Intracranial atherosclerosis. Skull: Old fractures of the left maxillary sinus, orbit and zygomatic arch, improved compared to the 11/2017 examination. No acute displaced calvarial fracture. Sinuses/Orbits: Scattered opacification of the right anterior ethmoidal air cells. Mucosal thickening of the bilateral maxillary sinuses with small air-fluid level within the left maxillary sinus. Scattered opacification of the left  mastoid air cells. Other: Regional soft tissues appear normal. IMPRESSION: 1. Similar findings of atrophy and microvascular ischemic disease without superimposed acute  intracranial process. 2. Old fractures of the left maxillary sinus, orbit and zygomatic arch, improved compared to the 11/2017 examination. 3. Sinus disease as above, improved compared to the 11/2017 examination, though there is an air-fluid level within the left maxillary sinus as could be seen in the setting of acute sinusitis. 4. Grossly unchanged appearance of known approximately 2.1 cm left sphenoid wing calcified meningioma. Electronically Signed   By: Sandi Mariscal M.D.   On: 07/30/2018 13:34   Ct Chest Wo Contrast  Result Date: 08/09/2018 CLINICAL DATA:  SOB. Hx small cell lung cancer, paroxysmal a-fib, HTN, diabetes, CHF, CAD, and CABG. EXAM: CT CHEST WITHOUT CONTRAST TECHNIQUE: Multidetector CT imaging of the chest was performed following the standard protocol without IV contrast. COMPARISON:  Chest CT 07/27/2018 FINDINGS: Cardiovascular: Coronary, aortic arch, and branch vessel atherosclerotic vascular disease. Mild cardiomegaly. Compared to the prior chest CT there has been interval CABG. Atrial appendage clip noted. Mediastinum/Nodes: Expected residual mediastinal edema. Trace residual pneumomediastinum, but nearly completely resolved. Small pericardial effusion, not unexpected in the postoperative setting. Pathologic mediastinal mass/adenopathy. An index AP window lymph node measures 1.7 cm in short axis on image 49/3, stable. The more bulky right paratracheal adenopathy likewise appears relatively stable. Some of this is in the vicinity of the SVC and is at least exerting extrinsic mass effect on the SVC. Lungs/Pleura: There complete obstruction of the right upper lobe bronchus with complete atelectasis of the right upper lobe. Possible bulging tumor into the right mainstem bronchus from the ostium of the right upper lobe bronchus on image 55/4. The bronchus intermedius and its branches appear patent. Moderate right and small left pleural effusions. The right pleural effusion was previously small  and the left pleural effusion is new compared to the prior CT. There is passive atelectasis associated with the pleural effusions. Mild bilateral airway thickening. Paraseptal emphysema. Upper Abdomen: High-density liver with borderline appearance for hemochromatosis. Portacaval node abnormally enlarged at 2.2 cm in short axis on image 144/3, stable. Slight lobularity of the right adrenal gland without a well-defined mass. Musculoskeletal: Lower thoracic spondylosis. No findings of sternal dehiscence. IMPRESSION: 1. Complete atelectasis of the right upper lobe. Probable tumor in the right upper lobe bronchus. Notable mediastinal and likely right hilar adenopathy along with a stable enlarged portacaval lymph node in the upper abdomen which could be malignant. 2. Postoperative findings related to recent CABG including a small residual amount of mediastinal edema and a trace amount of residual pneumomediastinum. 3. There is a moderate right pleural effusion (formerly small on 07/27/2018) and a new small left pleural effusion. These are associated with passive atelectasis. 4. Airway thickening is present, suggesting bronchitis or reactive airways disease. 5. Other imaging findings of potential clinical significance: Aortic Atherosclerosis (ICD10-I70.0). Coronary atherosclerosis with recent CABG. Emphysema (ICD10-J43.9). High-density hepatic parenchyma suspicious for hemochromatosis. Electronically Signed   By: Van Clines M.D.   On: 08/09/2018 02:30   Ct Chest Wo Contrast  Result Date: 07/27/2018 CLINICAL DATA:  Evaluate right paratracheal stripe/superior EXAM: CT CHEST WITHOUT CONTRAST TECHNIQUE: Multidetector CT imaging of the chest was performed following the standard protocol without IV contrast. COMPARISON:  CXR 07/26/2018 FINDINGS: Cardiovascular: Atherosclerosis of the great vessels at their origins with conventional branch pattern. Moderate atherosclerosis of the distal aortic arch and descending  aorta without aneurysm. Left main and three-vessel dense coronary arteriosclerosis. Heart size  is normal without pericardial effusion. Mediastinum/Nodes: Mild thyromegaly with retroclavicular extension of the thyroid gland without dominant mass seen. Mediastinal adenopathy along the prevascular, right upper and lower paratracheal portions of the mediastinum, the largest estimated at 2.3 cm short axis along the right lower paratracheal portion given limitations of a noncontrast study. Mainstem bronchi and trachea are patent. Assessment of hilar adenopathy is somewhat limited due to lack of IV contrast but given new areas of subtle soft tissue prominence, suspect that there is at least right-sided adenopathy since prior exam measuring up to 1.2 cm short axis. Lungs/Pleura: There is a partially necrotic appearing right paramediastinal soft tissue mass in the right upper lobe, the margins somewhat difficult to identify due to lack of IV contrast but is estimated as measuring approximately 3.5 x 3.4 x 3.2 cm, series 3/61 and series 6/40. Additionally, nodular opacities are noted along the major fissure in the posterior segment of right upper lobe, new since prior measuring up to 6 mm. Partially cavitary nodule in the right middle lobe is identified measuring up to 8 mm, series 4/72 with smaller sub 3 mm nodule, series 4/66. Dependent changes are noted along the posterior aspect of the right upper lobe. Small right effusion is present with sub pleural areas of streaky interstitial change likely atelectatic and less likely lymphangitic. Paraseptal and centrilobular emphysema is noted, upper lobe predominant with scarring at the apices. Mild peribronchial thickening and bronchiectasis is noted in the lower lobes. Upper Abdomen: No adrenal mass. No definite space-occupying mass of the included liver. Punctate calcification in the pole of the included left kidney. Musculoskeletal: No chest wall mass or suspicious bone lesions  identified. IMPRESSION: 1. Partially necrotic masslike abnormality abutting the superior mediastinum within the medial right upper lobe measuring approximately 3.5 x 3.4 x 3.2 cm. This is associated with smaller nodular opacities in the right middle lobe and abutting the major fissure, the next largest is approximately 8 mm and partially cavitary in appearance in the right middle lobe. Pulmonary and/or cardiothoracic consultation is suggested. 2. There is associated mediastinal and right hilar lymphadenopathy as above described likely metastatic. 3. Small right pleural effusion. 4. Centrilobular and paraseptal emphysema. 5. Left main and three-vessel coronary arteriosclerosis and aortic atherosclerosis. Aortic Atherosclerosis (ICD10-I70.0) and Emphysema (ICD10-J43.9). Electronically Signed   By: Ashley Royalty M.D.   On: 07/27/2018 23:29   Mr Jeri Cos ZO Contrast  Result Date: 08/09/2018 CLINICAL DATA:  Seizure.  Lung cancer. EXAM: MRI HEAD WITHOUT AND WITH CONTRAST TECHNIQUE: Multiplanar, multiecho pulse sequences of the brain and surrounding structures were obtained without and with intravenous contrast. CONTRAST:  8 mL Gadavist COMPARISON:  Head CT 08/09/2018 FINDINGS: The study is degraded by motion, despite efforts to reduce this artifact, including utilization of motion-resistant MR sequences. The findings of the study are interpreted in the context of reduced sensitivity/specificity. BRAIN: There is no acute infarct, acute hemorrhage, hydrocephalus or extra-axial collection. The midline structures are normal. No midline shift or other mass effect. 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 Multifocal white matter hyperintensity, most commonly due to chronic ischemic microangiopathy. Generalized atrophy without lobar predilection. Susceptibility-sensitive sequences show no chronic microhemorrhage or superficial siderosis. No abnormal contrast enhancement. VASCULAR: Major intracranial arterial  and venous sinus flow voids are normal. SKULL AND UPPER CERVICAL SPINE: Calvarial bone marrow signal is normal. There is no skull base mass. Visualized upper cervical spine and soft tissues are normal. SINUSES/ORBITS: There are bilateral maxillary sinus fluid levels and a left mastoid  effusion. The orbits are normal. IMPRESSION: 1. No acute intracranial abnormality. 2. No intracranial metastatic disease. 3. Bilateral maxillary sinus fluid levels and left mastoid effusion. Correlate for symptoms of acute sinusitis. 4. Generalized atrophy and chronic ischemic microangiopathy. Electronically Signed   By: Ulyses Jarred M.D.   On: 08/09/2018 23:14   Dg Chest Port 1 View  Result Date: 08/10/2018 CLINICAL DATA:  Shortness of breath. EXAM: PORTABLE CHEST 1 VIEW COMPARISON:  Radiographs of August 09, 2018. FINDINGS: Stable cardiomediastinal silhouette. Status post coronary artery bypass graft. Stable right upper lobe atelectasis is noted with right hilar prominence concerning for neoplasm. No pneumothorax is noted. Left lung is clear. Right basilar atelectasis or infiltrate is noted with probable associated pleural effusion. Bony thorax is unremarkable. IMPRESSION: Stable right upper lobe atelectasis is noted with right hilar prominence concerning for neoplasm. Stable right basilar atelectasis or infiltrate is noted with associated pleural effusion. Electronically Signed   By: Marijo Conception, M.D.   On: 08/10/2018 11:38   Dg Chest Port 1 View  Result Date: 08/09/2018 CLINICAL DATA:  Right lung cancer. Postoperative for CABG by 1 week. EXAM: PORTABLE CHEST 1 VIEW COMPARISON:  08/08/2018 FINDINGS: There complete atelectasis of the right upper lobe with right hilar prominence favoring tumor. Atrial appendage clip noted.  Prior CABG. Atherosclerotic calcification of the aortic arch. Right basilar atelectasis with some resulting irregularity of the right hemidiaphragm. No overt cardiomegaly.  Thoracic spondylosis.  IMPRESSION: 1. Continued complete atelectasis of the right upper lobe. Mildly increased atelectasis along the right hemidiaphragm. 2. Recent CABG without edema. 3. Atrial appendage clip. 4.  Aortic Atherosclerosis (ICD10-I70.0). Electronically Signed   By: Van Clines M.D.   On: 08/09/2018 01:19   Dg Chest Port 1 View  Result Date: 08/03/2018 CLINICAL DATA:  Follow-up chest tube on the left EXAM: PORTABLE CHEST 1 VIEW COMPARISON:  08/02/2018 FINDINGS: Cardiac shadow is enlarged but stable. Postsurgical changes are again seen. The Swan-Ganz catheter and mediastinal drain have been removed in the interval. Left thoracostomy catheter remains. No definitive pneumothorax is seen. Increasing density is noted in the right upper lobe which may be related to some atelectasis and volume loss. No bony abnormality is seen IMPRESSION: Slight increase in the degree of right upper lobe atelectasis. No pneumothorax is noted on the left. Electronically Signed   By: Inez Catalina M.D.   On: 08/03/2018 08:37   Dg Chest Port 1 View  Result Date: 08/02/2018 CLINICAL DATA:  Chest tube present, follow-up EXAM: PORTABLE CHEST 1 VIEW COMPARISON:  Chest x-ray of 08/01/2017 FINDINGS: The endotracheal tube and NG tube have been removed. A left chest tube remains and no pneumothorax is noted. There is mild basilar volume loss. Right Swan-Ganz catheter is coiled in main pulmonary artery. Cardiomegaly is stable. There may be very mild pulmonary vascular congestion present and small effusions cannot be excluded. Median sternotomy sutures are present IMPRESSION: 1. Endotracheal tube and NG tube removed. 2. Little change in aeration with mild basilar volume loss and possible small effusions. 3. Left chest tube remains with no pneumothorax. 4. Little change in cardiomegaly and possible minimal pulmonary vascular congestion. Electronically Signed   By: Ivar Drape M.D.   On: 08/02/2018 08:15   Dg Chest Port 1 View  Result Date:  08/01/2018 CLINICAL DATA:  Status post CABG EXAM: PORTABLE CHEST 1 VIEW COMPARISON:  07/30/2018 FINDINGS: ET tube tip is above the carina. Enteric tube tip is below the field of view. There is an IJ  catheter with tip looped in the right ventricular outflow tract. Mediastinal drain and left chest tubes identified. No pneumothorax identified. Mild edema identified within the right base. No airspace densities identified. Right perihilar and right paratracheal lung mass again noted. IMPRESSION: 1. Postoperative changes from CABG procedure. No complications identified. 2. Right perihilar and right paratracheal lung mass as before. Electronically Signed   By: Kerby Moors M.D.   On: 08/01/2018 14:25   Dg Chest Port 1 View  Result Date: 07/30/2018 CLINICAL DATA:  Chest pain EXAM: PORTABLE CHEST 1 VIEW COMPARISON:  07/26/2018 FINDINGS: Mild cardiomegaly. Normal vascularity. Right paratracheal mass is stable. No pneumothorax or pleural effusion. IMPRESSION: Stable right paratracheal mass. Stable cardiomegaly. Electronically Signed   By: Marybelle Killings M.D.   On: 07/30/2018 09:28       IMPRESSION/PLAN: 1. 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  Dr. Lisbeth Renshaw discusses the pathology findings and the work-up thus far.  The patient does need to have pet imaging to rule out progressive disease.  We discussed the rationale for her chemoradiotherapy which is given concurrently over 6-1/2 weeks.  He will come for simulation next Thursday following his PET scan, and we would anticipate beginning treatment 08/29/2018.  We discussed the risks, benefits, short and long-term effects of treatment and the patient is interested in moving forward.  Written consent is obtained, a copy was provided to the patient.  He will call us with questions or concerns prior to his next visit. 2. Right pleural effusion.  Patient appears to be quite symptomatic from this, I have discussed his case with Dr. Servando Snare, and  we will move forward with ordering therapeutic and diagnostic thoracentesis.  Patient is in agreement with this and will be contacted by interventional radiology to coordinate.  In a visit lasting 60 minutes, greater than 50% of the time was spent face to face discussing his case, and coordinating the patient's care.   The above documentation reflects my direct findings during this shared patient visit. Please see the separate note by Dr. Lisbeth Renshaw on this date for the remainder of the patient's plan of care.    Carola Rhine, PAC

## 2018-08-11 NOTE — Telephone Encounter (Signed)
Oncology Nurse Navigator Documentation  Oncology Nurse Navigator Flowsheets 08/11/2018  Navigator Location [facility]  Navigator Encounter Type Telephone/Rad Onc needs to see patient today and requested me to up date patient to come in earlier today.  I called and patient's son answered I up dated and he verbalized understanding.   Telephone Outgoing Call  Abnormal Finding Date 07/27/2018  Confirmed Diagnosis Date 08/01/2018  Treatment Phase Pre-Tx/Tx Discussion  Barriers/Navigation Needs Coordination of Care  Interventions Coordination of Care  Coordination of Care Appts  Acuity Level 2  Time Spent with Patient 30

## 2018-08-11 NOTE — Progress Notes (Signed)
START ON PATHWAY REGIMEN - Small Cell Lung     Cycles 1 through 4, every 21 days:     Atezolizumab      Carboplatin      Etoposide    Cycles 5 and beyond, every 21 days:     Atezolizumab   **Always confirm dose/schedule in your pharmacy ordering system**  Patient Characteristics: Newly Diagnosed, Preoperative or Nonsurgical Candidate (Clinical Staging), First Line, Extensive Stage Therapeutic Status: Newly Diagnosed, Preoperative or Nonsurgical Candidate (Clinical Staging) AJCC T Category: cT1a AJCC N Category: cN2 AJCC M Category: cM1a AJCC 8 Stage Grouping: IVA Stage Classification: Extensive Intent of Therapy: Non-Curative / Palliative Intent, Discussed with Patient

## 2018-08-11 NOTE — Progress Notes (Signed)
[place] [phone]   [phone] Multidisciplinary thoracic oncology clinic  CONSULT NOTE  REFERRING PHYSICIAN: Dr. Lanelle Bal  REASON FOR CONSULTATION:  77 years old white male recently diagnosed with lung cancer.  HPI Gregory Bates is a 77 y.o. male with past medical history significant for long history of smoking but quit 30 years ago.  The patient also has a history of chronic renal insufficiency, coronary artery disease status post CABG, hypertension, dyslipidemia, cardiomyopathy, and anemia.  He mentioned that on 07/26/2018 he had chest pain and shortness of breath.  He presented to [facility] emergency department for evaluation.  He was found to have elevated troponin and the patient underwent cardiac catheterization and found to have significant three-vessel coronary artery disease.  He was seen by Dr. Servando Snare and underwent coronary artery bypass grafting.  During his evaluation he had chest x-ray on 07/26/2018 that showed prominence of the right paratracheal stripe/right suprahilar contour.  This was followed by CT scan of the chest without contrast on 07/27/2018 and that showed partially necrotic masslike abnormality abutting the superior mediastinum within the medial right upper lobe measuring 3.5 x 3.4 x 3.2 cm.  This was associated with a smaller nodular opacities in the right middle lobe and abutting the major fissure, the next largest is approximately 0.8 cm and partially cavitary in appearance in the right middle lobe.  There was also associated mediastinal and right hilar lymphadenopathy as well as a small right pleural effusion. During his cardiac bypass surgery, Dr. Servando Snare performed biopsy of the pericardial lymph node as well as simple excision of the right hilar soft tissue mass and the final pathology ([id number]) was consistent with small cell carcinoma.  The small cell carcinoma was positive with CD 56, cytokeratin AE1/AE3,  chromogranin and TTF-1 and negative for leukocyte common antigen (CD 45). CT scan of the head as well as MRI of the brain showed no evidence for metastatic disease to the brain. Patient was admitted recently to [facility] with sudden shortness of breath as well as coughing spells and generalized tonic/clonic like activity concerning for seizure lasted for around 1 minute.  He was discharged yesterday from the hospital after treatment of his condition. The patient is feeling much better today except for the generalized fatigue and weakness.  He continues to complain of mild chest pain as well as shortness of breath and cough with no hemoptysis.  He denied having any significant weight loss or night sweats.  He has no nausea, vomiting, diarrhea or constipation.  He denied having any fever or chills.  He has no headache or visual changes. Family history significant for mother with heart disease, father died at age 31 with old age complication and half brother with pancreatic cancer. The patient is married and has 1 child.  He was accompanied today by his grandson Gregory Bates.  He used to work in apartment maintenance.  He has a history of his smoking more than 1 pack/day for around 30 years and quit 30 years ago.  He has no history of alcohol or drug abuse.  HPI  Past Medical History:  Diagnosis Date  . Acute renal insufficiency    a. During 11/2013 adm with atrial flutter.  . Anemia, unspecified   . CAD (coronary artery disease), native coronary artery    a. s/p cath May 2011 >> DES distal RCA;  b. 01/2013 NSTEMI >> OM1 99p (2.75x12 Promus Premier DES);  c. LHC (2/15): PCI:  Promus DES to dist RCA ;  d. LHC 1/16 - dLM 20, oLAD 30, small D1 tandem 64, pOM1 stent ok, mOM 69-60, dOM bifurcation 50-60 in both vessels, AV 60-70 jailed by stent, pRCA 73 mRCA stent ok, dRCA stent ok, PLB smal 50, dPDA 60, 80; EF 35-40 >> med Rx - PCI of PDA if angina  . Chronic combined systolic and diastolic CHF (congestive  heart failure) ([place]) 12/23/2015   Echo 2/17: Mild LVH, EF 45-50%, inferior akinesis, grade 2 diastolic dysfunction, mild AI, mild MR, mild LAE, PASP 44 mmHg   . Colorectal polyps 2002  . Diabetes mellitus    AODM  . Erectile dysfunction   . History of echocardiogram    a. 01/2013 Echo: EF 55%, mid-dist inflat HK, Gr1 DD, Triv AI/TR, Mild MR.;  b.  Echo (1/15): Mild LVH, EF 55%, inferolateral hypokinesis, grade 1 diastolic dysfunction, mild AI, trivial MR, moderate LAE, PASP 36 mmHg   . Hx of cardiovascular stress test    Stress myoview 08/07/13 with LVEF 44%, inferior scar, no ischemia  . Hyperlipidemia   . Hypertension   . Ischemic cardiomyopathy    a. EF 35-40% by LHC in 1/16; b. Echo 2/17: Mild LVH, EF 45-50%, inferior akinesis, grade 2 diastolic dysfunction, mild AI, mild MR, mild LAE, PASP 44 mmHg  . Myocardial infarction ([place])   . PAF (paroxysmal atrial fibrillation) ([place])    a. Dx 11/2013. Spont conv. Placed on eliquis.  . Small cell lung cancer in adult [facility]) 07/28/2018    Past Surgical History:  Procedure Laterality Date  . BIOPSY  08/01/2018   Procedure: BIOPSY OF PERICARDIAL LYMPH NODE AND HILAR LYMPH NODE;  Surgeon: Grace Isaac, MD;  Location: [place];  Service: Open Heart Surgery;;  . CLIPPING OF ATRIAL APPENDAGE Left 08/01/2018   Procedure: CLIPPING OF ATRIAL APPENDAGE;  Surgeon: Grace Isaac, MD;  Location: [place];  Service: Open Heart Surgery;  Laterality: Left;  . COLONOSCOPY W/ POLYPECTOMY  2002    GI  . COLONOSCOPY W/ POLYPECTOMY  2004; 2007 negative  . CORONARY ANGIOPLASTY  12/26/2013  . CORONARY ARTERY BYPASS GRAFT N/A 08/01/2018   Procedure: CORONARY ARTERY BYPASS GRAFTING (CABG) TIMES THREE USING LEFT INTERNAL MAMMARY ARTERY TO LAD, RIGHT GREATER SAPHENOUS VEIN GRAFT TO OM AND RCA, VEIN HARVESTED ENDOSCOPICALLY;  Surgeon: Grace Isaac, MD;  Location: [place];  Service: Open Heart Surgery;  Laterality: N/A;  . CORONARY STENT PLACEMENT  2007, 2011  .  infantile paralysis facial asymmetry    . LEFT HEART CATH AND CORONARY ANGIOGRAPHY N/A 05/18/2017   Procedure: Left Heart Cath and Coronary Angiography;  Surgeon: Martinique, Peter M, MD;  Location: [place] CV LAB;  Service: Cardiovascular;  Laterality: N/A;  . LEFT HEART CATH AND CORONARY ANGIOGRAPHY N/A 07/27/2018   Procedure: LEFT HEART CATH AND CORONARY ANGIOGRAPHY;  Surgeon: Martinique, Peter M, MD;  Location: [place] CV LAB;  Service: Cardiovascular;  Laterality: N/A;  . LEFT HEART CATHETERIZATION WITH CORONARY ANGIOGRAM N/A 10/02/2012   Procedure: LEFT HEART CATHETERIZATION WITH CORONARY ANGIOGRAM;  Surgeon: Peter M Martinique, MD;  Location: [facility] CATH LAB;  Service: Cardiovascular;  Laterality: N/A;  . LEFT HEART CATHETERIZATION WITH CORONARY ANGIOGRAM N/A 02/20/2013   Procedure: LEFT HEART CATHETERIZATION WITH CORONARY ANGIOGRAM;  Surgeon: Burnell Blanks, MD;  Location: [facility] CATH LAB;  Service: Cardiovascular;  Laterality: N/A;  . LEFT HEART CATHETERIZATION WITH CORONARY ANGIOGRAM N/A 12/26/2013   Procedure: LEFT HEART CATHETERIZATION WITH CORONARY ANGIOGRAM;  Surgeon: Burnell Blanks, MD;  Location: [facility] CATH LAB;  Service: Cardiovascular;  Laterality: N/A;  . LEFT HEART CATHETERIZATION WITH CORONARY ANGIOGRAM N/A 11/28/2014   Procedure: LEFT HEART CATHETERIZATION WITH CORONARY ANGIOGRAM;  Surgeon: Burnell Blanks, MD;  Location: [facility] CATH LAB;  Service: Cardiovascular;  Laterality: N/A;  . PERCUTANEOUS CORONARY STENT INTERVENTION (PCI-S)  10/02/2012   Procedure: PERCUTANEOUS CORONARY STENT INTERVENTION (PCI-S);  Surgeon: Peter M Martinique, MD;  Location: [facility] CATH LAB;  Service: Cardiovascular;;  . PERCUTANEOUS CORONARY STENT INTERVENTION (PCI-S)  02/20/2013   Procedure: PERCUTANEOUS CORONARY STENT INTERVENTION (PCI-S);  Surgeon: Burnell Blanks, MD;  Location: [facility] CATH LAB;  Service: Cardiovascular;;  . PERCUTANEOUS CORONARY STENT INTERVENTION (PCI-S)  12/26/2013   Procedure:  PERCUTANEOUS CORONARY STENT INTERVENTION (PCI-S);  Surgeon: Burnell Blanks, MD;  Location: [facility] CATH LAB;  Service: Cardiovascular;;  . TEE WITHOUT CARDIOVERSION N/A 08/01/2018   Procedure: TRANSESOPHAGEAL ECHOCARDIOGRAM (TEE);  Surgeon: Grace Isaac, MD;  Location: [place];  Service: Open Heart Surgery;  Laterality: N/A;  . WISDOM TOOTH EXTRACTION      Family History  Problem Relation Age of Onset  . Heart attack Brother 56  . Colon cancer Brother   . Prostate cancer Brother   . Breast cancer Sister   . Stroke Sister   . Diabetes Neg Hx   . Hypertension Neg Hx     Social History Social History   Tobacco Use  . Smoking status: Former Smoker    Last attempt to quit: 11/02/1984    Years since quitting: 33.7  . Smokeless tobacco: Never Used  . Tobacco comment: smoked [place], up to 3 ppd (!)  Substance Use Topics  . Alcohol use: No  . Drug use: No    Allergies  Allergen Reactions  . Brilinta [Ticagrelor] Other (See Comments)    Arthralgias & myalgias  . Crestor [Rosuvastatin] Other (See Comments)    Myalgias    Current Outpatient Medications  Medication Sig Dispense Refill  . acetaminophen (TYLENOL) 325 MG tablet Take 2 tablets (650 mg total) by mouth every 6 (six) hours as needed for mild pain.    [name] Kitchen amiodarone (PACERONE) 200 MG tablet Take 1 tablet (200 mg total) by mouth 2 (two) times daily. For one week;then take 200 mg daily thereafter 90 tablet 3  . aspirin EC 81 MG EC tablet Take 1 tablet (81 mg total) by mouth daily.    [name] Kitchen atorvastatin (LIPITOR) 20 MG tablet Take 1 tablet (20 mg total) by mouth daily. 90 tablet 3  . cholecalciferol (VITAMIN D) 1000 UNITS tablet Take 1,000 Units by mouth every evening.     [name] Kitchen ELIQUIS 5 MG TABS tablet TAKE 1 TABLET BY MOUTH TWICE DAILY (Patient taking differently: Take 5 mg by mouth 2 (two) times daily. ) 180 tablet 3  . fexofenadine (ALLEGRA) 180 MG tablet Take 180 mg by mouth daily as needed for allergies.     . fluticasone  (FLONASE) 50 MCG/ACT nasal spray Place 1 spray into both nostrils 2 (two) times daily.     [name] Kitchen guaiFENesin (MUCINEX) 600 MG 12 hr tablet Take 1 tablet (600 mg total) by mouth 2 (two) times daily as needed for cough or to loosen phlegm.    . metoprolol tartrate (LOPRESSOR) 25 MG tablet Take 1 tablet (25 mg total) by mouth 2 (two) times daily. 180 tablet 3  . Multiple Vitamin (MULTIVITAMIN) tablet Take 1 tablet by mouth at bedtime.     . traMADol Veatrice Bourbon)  50 MG tablet Take 50 mg by mouth every 4-6 hours PRN severe pain 30 tablet 0  . vitamin E 100 UNIT capsule Take 100 Units by mouth at bedtime.      No current facility-administered medications for this visit.     Review of Systems  Constitutional: positive for anorexia and fatigue Eyes: negative Ears, nose, mouth, throat, and face: negative Respiratory: positive for cough, dyspnea on exertion and pleurisy/chest pain Cardiovascular: negative Gastrointestinal: negative Genitourinary:negative Integument/breast: negative Hematologic/lymphatic: negative Musculoskeletal:positive for muscle weakness Neurological: negative Behavioral/Psych: negative Endocrine: negative Allergic/Immunologic: negative  Physical Exam  PPI:RJJOA, healthy, no distress, well nourished, well developed and anxious SKIN: skin color, texture, turgor are normal, no rashes or significant lesions HEAD: Normocephalic, No masses, lesions, tenderness or abnormalities EYES: normal, PERRLA, Conjunctiva are pink and non-injected EARS: External ears normal, Canals clear OROPHARYNX:no exudate, no erythema and lips, buccal mucosa, and tongue normal  NECK: supple, no adenopathy, no JVD LYMPH:  no palpable lymphadenopathy, no hepatosplenomegaly LUNGS: decreased breath sounds HEART: regular rate & rhythm, no murmurs and no gallops ABDOMEN:abdomen soft, non-tender, normal bowel sounds and no masses or organomegaly BACK: Back symmetric, no curvature., No CVA  tenderness EXTREMITIES:no joint deformities, effusion, or inflammation, no edema  NEURO: alert & oriented x 3 with fluent speech, no focal motor/sensory deficits  PERFORMANCE STATUS: ECOG 1  LABORATORY DATA: Lab Results  Component Value Date   WBC 9.3 08/09/2018   HGB 8.5 (L) 08/09/2018   HCT 27.1 (L) 08/09/2018   MCV 91.2 08/09/2018   PLT 367 08/09/2018      Chemistry      Component Value Date/Time   NA 133 (L) 08/09/2018 0255   NA 129 (L) 11/17/2017 1630   K 4.0 08/09/2018 0255   CL 98 08/09/2018 0255   CO2 25 08/09/2018 0255   BUN 23 08/09/2018 0255   BUN 12 11/17/2017 1630   CREATININE 1.87 (H) 08/09/2018 0255   CREATININE 1.67 (H) 10/30/2016 1033      Component Value Date/Time   CALCIUM 8.0 (L) 08/09/2018 0255   ALKPHOS 52 08/09/2018 0255   AST 21 08/09/2018 0255   ALT 29 08/09/2018 0255   BILITOT 0.8 08/09/2018 0255       RADIOGRAPHIC STUDIES: Dg Chest 2 View  Result Date: 08/05/2018 CLINICAL DATA:  CABG. EXAM: CHEST - 2 VIEW COMPARISON:  08/03/2018. FINDINGS: Interim removal of right IJ sheath. Prior CABG. Left atrial appendage clip noted in stable position. Stable cardiomegaly. Right upper lobe atelectatic changes, progressed from prior exam. Small bilateral pleural effusions, progressed from prior exam. No pneumothorax. IMPRESSION: 1.  Interim removal of right IJ sheath.  No pneumothorax. 2. Prior CABG. Left atrial appendage clip in stable position. Stable cardiomegaly. 3. Progressive right upper lobe atelectasis. Slight progression of bilateral small pleural effusions. Electronically Signed   By: Marcello Moores  Register   On: 08/05/2018 08:23   Dg Chest 2 View  Result Date: 07/26/2018 CLINICAL DATA:  Chest pain EXAM: CHEST - 2 VIEW COMPARISON:  07/24/2018 chest radiograph. FINDINGS: Normal heart size. Prominence of the right paratracheal stripe/right superior hilar contour. Aortic atherosclerosis. No pneumothorax. No pleural effusion. Hyperinflated lungs. IMPRESSION:  Prominence of the right paratracheal stripe/right suprahilar contour. Recommend chest CT with IV contrast to exclude adenopathy/lung mass. Hyperinflated lungs, suggesting COPD. Electronically Signed   By: Ilona Sorrel M.D.   On: 07/26/2018 01:14   Dg Chest 2 View  Result Date: 07/24/2018 CLINICAL DATA:  Pt reports cough x yesterday and left sided  chest pain for over 1 hour; Hx of MI w/ 5 stents, DM, and HTN; former smoker EXAM: CHEST - 2 VIEW COMPARISON:  07/19/2018 FINDINGS: Cardiac silhouette is normal in size and configuration no mediastinal or hilar masses. No evidence of adenopathy. Clear lungs.  No pleural effusion or pneumothorax. Skeletal structures are demineralized but intact. IMPRESSION: No active cardiopulmonary disease. Electronically Signed   By: Lajean Manes M.D.   On: 07/24/2018 12:17   Ct Head Wo Contrast  Result Date: 08/09/2018 CLINICAL DATA:  Altered mental status EXAM: CT HEAD WITHOUT CONTRAST TECHNIQUE: Contiguous axial images were obtained from the base of the skull through the vertex without intravenous contrast. COMPARISON:  Head CT 07/31/2009 FINDINGS: Brain: There is no hemorrhage or extra-axial collection. Unchanged appearance of calcified meningioma at the left sphenoid wing. The size and configuration of the ventricles and extra-axial CSF spaces are normal. There is no acute or chronic infarction. There is hypoattenuation of the periventricular white matter, most commonly indicating chronic ischemic microangiopathy. Vascular: Atherosclerotic calcification of the internal carotid arteries at the skull base. No abnormal hyperdensity of the major intracranial arteries or dural venous sinuses. Skull: The visualized skull base, calvarium and extracranial soft tissues are normal. Sinuses/Orbits: Left-greater-than-right maxillary sinus opacification with fluid levels. There is also a left mastoid effusion. The orbits are normal. IMPRESSION: 1. No acute intracranial abnormality. 2.  Unchanged appearance of calcified left sphenoid wing meningioma. 3. Maxillary sinus opacification with fluid levels. Correlate for symptoms of acute sinusitis. Electronically Signed   By: Ulyses Jarred M.D.   On: 08/09/2018 02:19   Ct Head Wo Contrast  Result Date: 07/30/2018 CLINICAL DATA:  Post heart catheterization now with subacute neurologic defects. EXAM: CT HEAD WITHOUT CONTRAST TECHNIQUE: Contiguous axial images were obtained from the base of the skull through the vertex without intravenous contrast. COMPARISON:  11/02/2017 FINDINGS: Brain: Similar findings of atrophy with centralized volume loss and mild commensurate ex vacuo dilatation the ventricular system. Re demonstrated scattered periventricular hypodensities, most conspicuous about the left centrum semiovale compatible with microvascular ischemic disease. Gray-white differentiation is otherwise well maintained without CT evidence of superimposed acute large territory infarct. Known approximately 2.1 x 1 cm meningioma involving the left sphenoid wing is unchanged compared to the 11/2017 examination (coronal image 27, series 5). No intraparenchymal or extra-axial hemorrhage. Unchanged size and configuration of the ventricles and the basilar cisterns. No midline shift. Vascular: Intracranial atherosclerosis. Skull: Old fractures of the left maxillary sinus, orbit and zygomatic arch, improved compared to the 11/2017 examination. No acute displaced calvarial fracture. Sinuses/Orbits: Scattered opacification of the right anterior ethmoidal air cells. Mucosal thickening of the bilateral maxillary sinuses with small air-fluid level within the left maxillary sinus. Scattered opacification of the left mastoid air cells. Other: Regional soft tissues appear normal. IMPRESSION: 1. Similar findings of atrophy and microvascular ischemic disease without superimposed acute intracranial process. 2. Old fractures of the left maxillary sinus, orbit and zygomatic  arch, improved compared to the 11/2017 examination. 3. Sinus disease as above, improved compared to the 11/2017 examination, though there is an air-fluid level within the left maxillary sinus as could be seen in the setting of acute sinusitis. 4. Grossly unchanged appearance of known approximately 2.1 cm left sphenoid wing calcified meningioma. Electronically Signed   By: Sandi Mariscal M.D.   On: 07/30/2018 13:34   Ct Chest Wo Contrast  Result Date: 08/09/2018 CLINICAL DATA:  SOB. Hx small cell lung cancer, paroxysmal a-fib, HTN, diabetes, CHF, CAD, and CABG. EXAM:  CT CHEST WITHOUT CONTRAST TECHNIQUE: Multidetector CT imaging of the chest was performed following the standard protocol without IV contrast. COMPARISON:  Chest CT 07/27/2018 FINDINGS: Cardiovascular: Coronary, aortic arch, and branch vessel atherosclerotic vascular disease. Mild cardiomegaly. Compared to the prior chest CT there has been interval CABG. Atrial appendage clip noted. Mediastinum/Nodes: Expected residual mediastinal edema. Trace residual pneumomediastinum, but nearly completely resolved. Small pericardial effusion, not unexpected in the postoperative setting. Pathologic mediastinal mass/adenopathy. An index AP window lymph node measures 1.7 cm in short axis on image 49/3, stable. The more bulky right paratracheal adenopathy likewise appears relatively stable. Some of this is in the vicinity of the SVC and is at least exerting extrinsic mass effect on the SVC. Lungs/Pleura: There complete obstruction of the right upper lobe bronchus with complete atelectasis of the right upper lobe. Possible bulging tumor into the right mainstem bronchus from the ostium of the right upper lobe bronchus on image 55/4. The bronchus intermedius and its branches appear patent. Moderate right and small left pleural effusions. The right pleural effusion was previously small and the left pleural effusion is new compared to the prior CT. There is passive  atelectasis associated with the pleural effusions. Mild bilateral airway thickening. Paraseptal emphysema. Upper Abdomen: High-density liver with borderline appearance for hemochromatosis. Portacaval node abnormally enlarged at 2.2 cm in short axis on image 144/3, stable. Slight lobularity of the right adrenal gland without a well-defined mass. Musculoskeletal: Lower thoracic spondylosis. No findings of sternal dehiscence. IMPRESSION: 1. Complete atelectasis of the right upper lobe. Probable tumor in the right upper lobe bronchus. Notable mediastinal and likely right hilar adenopathy along with a stable enlarged portacaval lymph node in the upper abdomen which could be malignant. 2. Postoperative findings related to recent CABG including a small residual amount of mediastinal edema and a trace amount of residual pneumomediastinum. 3. There is a moderate right pleural effusion (formerly small on 07/27/2018) and a new small left pleural effusion. These are associated with passive atelectasis. 4. Airway thickening is present, suggesting bronchitis or reactive airways disease. 5. Other imaging findings of potential clinical significance: Aortic Atherosclerosis (ICD10-I70.0). Coronary atherosclerosis with recent CABG. Emphysema (ICD10-J43.9). High-density hepatic parenchyma suspicious for hemochromatosis. Electronically Signed   By: Van Clines M.D.   On: 08/09/2018 02:30   Ct Chest Wo Contrast  Result Date: 07/27/2018 CLINICAL DATA:  Evaluate right paratracheal stripe/superior EXAM: CT CHEST WITHOUT CONTRAST TECHNIQUE: Multidetector CT imaging of the chest was performed following the standard protocol without IV contrast. COMPARISON:  CXR 07/26/2018 FINDINGS: Cardiovascular: Atherosclerosis of the great vessels at their origins with conventional branch pattern. Moderate atherosclerosis of the distal aortic arch and descending aorta without aneurysm. Left main and three-vessel dense coronary arteriosclerosis.  Heart size is normal without pericardial effusion. Mediastinum/Nodes: Mild thyromegaly with retroclavicular extension of the thyroid gland without dominant mass seen. Mediastinal adenopathy along the prevascular, right upper and lower paratracheal portions of the mediastinum, the largest estimated at 2.3 cm short axis along the right lower paratracheal portion given limitations of a noncontrast study. Mainstem bronchi and trachea are patent. Assessment of hilar adenopathy is somewhat limited due to lack of IV contrast but given new areas of subtle soft tissue prominence, suspect that there is at least right-sided adenopathy since prior exam measuring up to 1.2 cm short axis. Lungs/Pleura: There is a partially necrotic appearing right paramediastinal soft tissue mass in the right upper lobe, the margins somewhat difficult to identify due to lack of IV contrast but is estimated  as measuring approximately 3.5 x 3.4 x 3.2 cm, series 3/61 and series 6/40. Additionally, nodular opacities are noted along the major fissure in the posterior segment of right upper lobe, new since prior measuring up to 6 mm. Partially cavitary nodule in the right middle lobe is identified measuring up to 8 mm, series 4/72 with smaller sub 3 mm nodule, series 4/66. Dependent changes are noted along the posterior aspect of the right upper lobe. Small right effusion is present with sub pleural areas of streaky interstitial change likely atelectatic and less likely lymphangitic. Paraseptal and centrilobular emphysema is noted, upper lobe predominant with scarring at the apices. Mild peribronchial thickening and bronchiectasis is noted in the lower lobes. Upper Abdomen: No adrenal mass. No definite space-occupying mass of the included liver. Punctate calcification in the pole of the included left kidney. Musculoskeletal: No chest wall mass or suspicious bone lesions identified. IMPRESSION: 1. Partially necrotic masslike abnormality abutting the  superior mediastinum within the medial right upper lobe measuring approximately 3.5 x 3.4 x 3.2 cm. This is associated with smaller nodular opacities in the right middle lobe and abutting the major fissure, the next largest is approximately 8 mm and partially cavitary in appearance in the right middle lobe. Pulmonary and/or cardiothoracic consultation is suggested. 2. There is associated mediastinal and right hilar lymphadenopathy as above described likely metastatic. 3. Small right pleural effusion. 4. Centrilobular and paraseptal emphysema. 5. Left main and three-vessel coronary arteriosclerosis and aortic atherosclerosis. Aortic Atherosclerosis (ICD10-I70.0) and Emphysema (ICD10-J43.9). Electronically Signed   By: Ashley Royalty M.D.   On: 07/27/2018 23:29   Mr Jeri Cos [facility] Contrast  Result Date: 08/09/2018 CLINICAL DATA:  Seizure.  Lung cancer. EXAM: MRI HEAD WITHOUT AND WITH CONTRAST TECHNIQUE: Multiplanar, multiecho pulse sequences of the brain and surrounding structures were obtained without and with intravenous contrast. CONTRAST:  8 mL Gadavist COMPARISON:  Head CT 08/09/2018 FINDINGS: The study is degraded by motion, despite efforts to reduce this artifact, including utilization of motion-resistant MR sequences. The findings of the study are interpreted in the context of reduced sensitivity/specificity. BRAIN: There is no acute infarct, acute hemorrhage, hydrocephalus or extra-axial collection. The midline structures are normal. No midline shift or other mass effect. 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 Multifocal white matter hyperintensity, most commonly due to chronic ischemic microangiopathy. Generalized atrophy without lobar predilection. Susceptibility-sensitive sequences show no chronic microhemorrhage or superficial siderosis. No abnormal contrast enhancement. VASCULAR: Major intracranial arterial and venous sinus flow voids are normal. SKULL AND UPPER CERVICAL SPINE: Calvarial  bone marrow signal is normal. There is no skull base mass. Visualized upper cervical spine and soft tissues are normal. SINUSES/ORBITS: There are bilateral maxillary sinus fluid levels and a left mastoid effusion. The orbits are normal. IMPRESSION: 1. No acute intracranial abnormality. 2. No intracranial metastatic disease. 3. Bilateral maxillary sinus fluid levels and left mastoid effusion. Correlate for symptoms of acute sinusitis. 4. Generalized atrophy and chronic ischemic microangiopathy. Electronically Signed   By: Ulyses Jarred M.D.   On: 08/09/2018 23:14   Dg Chest Port 1 View  Result Date: 08/10/2018 CLINICAL DATA:  Shortness of breath. EXAM: PORTABLE CHEST 1 VIEW COMPARISON:  Radiographs of August 09, 2018. FINDINGS: Stable cardiomediastinal silhouette. Status post coronary artery bypass graft. Stable right upper lobe atelectasis is noted with right hilar prominence concerning for neoplasm. No pneumothorax is noted. Left lung is clear. Right basilar atelectasis or infiltrate is noted with probable associated pleural effusion. Bony thorax is unremarkable. IMPRESSION:  Stable right upper lobe atelectasis is noted with right hilar prominence concerning for neoplasm. Stable right basilar atelectasis or infiltrate is noted with associated pleural effusion. Electronically Signed   By: Marijo Conception, M.D.   On: 08/10/2018 11:38   Dg Chest Port 1 View  Result Date: 08/09/2018 CLINICAL DATA:  Right lung cancer. Postoperative for CABG by 1 week. EXAM: PORTABLE CHEST 1 VIEW COMPARISON:  08/08/2018 FINDINGS: There complete atelectasis of the right upper lobe with right hilar prominence favoring tumor. Atrial appendage clip noted.  Prior CABG. Atherosclerotic calcification of the aortic arch. Right basilar atelectasis with some resulting irregularity of the right hemidiaphragm. No overt cardiomegaly.  Thoracic spondylosis. IMPRESSION: 1. Continued complete atelectasis of the right upper lobe. Mildly increased  atelectasis along the right hemidiaphragm. 2. Recent CABG without edema. 3. Atrial appendage clip. 4.  Aortic Atherosclerosis (ICD10-I70.0). Electronically Signed   By: Van Clines M.D.   On: 08/09/2018 01:19   Dg Chest Port 1 View  Result Date: 08/03/2018 CLINICAL DATA:  Follow-up chest tube on the left EXAM: PORTABLE CHEST 1 VIEW COMPARISON:  08/02/2018 FINDINGS: Cardiac shadow is enlarged but stable. Postsurgical changes are again seen. The Swan-Ganz catheter and mediastinal drain have been removed in the interval. Left thoracostomy catheter remains. No definitive pneumothorax is seen. Increasing density is noted in the right upper lobe which may be related to some atelectasis and volume loss. No bony abnormality is seen IMPRESSION: Slight increase in the degree of right upper lobe atelectasis. No pneumothorax is noted on the left. Electronically Signed   By: Inez Catalina M.D.   On: 08/03/2018 08:37   Dg Chest Port 1 View  Result Date: 08/02/2018 CLINICAL DATA:  Chest tube present, follow-up EXAM: PORTABLE CHEST 1 VIEW COMPARISON:  Chest x-ray of 08/01/2017 FINDINGS: The endotracheal tube and NG tube have been removed. A left chest tube remains and no pneumothorax is noted. There is mild basilar volume loss. Right Swan-Ganz catheter is coiled in main pulmonary artery. Cardiomegaly is stable. There may be very mild pulmonary vascular congestion present and small effusions cannot be excluded. Median sternotomy sutures are present IMPRESSION: 1. Endotracheal tube and NG tube removed. 2. Little change in aeration with mild basilar volume loss and possible small effusions. 3. Left chest tube remains with no pneumothorax. 4. Little change in cardiomegaly and possible minimal pulmonary vascular congestion. Electronically Signed   By: Ivar Drape M.D.   On: 08/02/2018 08:15   Dg Chest Port 1 View  Result Date: 08/01/2018 CLINICAL DATA:  Status post CABG EXAM: PORTABLE CHEST 1 VIEW COMPARISON:   07/30/2018 FINDINGS: ET tube tip is above the carina. Enteric tube tip is below the field of view. There is an IJ catheter with tip looped in the right ventricular outflow tract. Mediastinal drain and left chest tubes identified. No pneumothorax identified. Mild edema identified within the right base. No airspace densities identified. Right perihilar and right paratracheal lung mass again noted. IMPRESSION: 1. Postoperative changes from CABG procedure. No complications identified. 2. Right perihilar and right paratracheal lung mass as before. Electronically Signed   By: Kerby Moors M.D.   On: 08/01/2018 14:25   Dg Chest Port 1 View  Result Date: 07/30/2018 CLINICAL DATA:  Chest pain EXAM: PORTABLE CHEST 1 VIEW COMPARISON:  07/26/2018 FINDINGS: Mild cardiomegaly. Normal vascularity. Right paratracheal mass is stable. No pneumothorax or pleural effusion. IMPRESSION: Stable right paratracheal mass. Stable cardiomegaly. Electronically Signed   By: Rodena Goldmann.D.  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  I personally and independently reviewed the scan images and discussed the results with the patient and his grandson. I explained to the patient that he has incurable condition and all the treatment option will be of palliative nature. I gave the patient the option of palliative care and hospice referral versus consideration of palliative systemic chemotherapy with carboplatin for AUC of 5 on day 1, etoposide 100 mg/M2 on days 1, 2 and 3 in addition to Tecentriq (Atezolizumab) 1200 mg IV every  3 weeks.  The patient is interested in proceeding with systemic chemotherapy.  The patient had recent cardiac surgery less than 2 weeks ago and he will not be a great candidate to start chemotherapy soon.  I recommended for him to start the first cycle of his treatment in around 2 weeks from now. I discussed with the patient the adverse effect of this treatment including but not limited to alopecia, myelosuppression, nausea and vomiting, peripheral neuropathy, liver or renal dysfunction as well as the immunotherapy mediated adverse effect including but not limited to skin rash, diarrhea, inflammation of the lung, kidney, liver, thyroid or other endocrine dysfunction. He also was seen by radiation oncology for consideration of concurrent radiotherapy to the locally advanced disease in the chest. I will arrange for the patient to have a chemotherapy education class before the first dose of his treatment. I will send prescription for Compazine 10 mg p.o. every 6 hours as needed for nausea to his pharmacy. The patient is scheduled to have a PET scan next week to complete the staging work-up of his disease. I will see him back for follow-up visit with the start of the first cycle of his chemotherapy. He was advised to call immediately if he has any concerning symptoms in the interval. The patient voices understanding of current disease status and treatment options and is in agreement with the current care plan.  All questions were answered. The patient knows to call the clinic with any problems, questions or concerns. We can certainly see the patient much sooner if necessary.  Thank you so much for allowing me to participate in the care of [place]. I will continue to follow up the patient with you and assist in his care.  I spent 55 minutes counseling the patient face to face. The total time spent in the appointment was 80 minutes.  Disclaimer: This note was dictated with voice recognition software.  Similar sounding words can inadvertently be transcribed and may not be corrected upon review.   Eilleen Kempf August 11, 2018, 4:21 PM

## 2018-08-12 ENCOUNTER — Telehealth: Payer: Self-pay | Admitting: *Deleted

## 2018-08-12 ENCOUNTER — Telehealth: Payer: Self-pay | Admitting: Internal Medicine

## 2018-08-12 ENCOUNTER — Other Ambulatory Visit: Payer: Self-pay

## 2018-08-12 ENCOUNTER — Encounter: Payer: Self-pay | Admitting: *Deleted

## 2018-08-12 DIAGNOSIS — C3491 Malignant neoplasm of unspecified part of right bronchus or lung: Secondary | ICD-10-CM

## 2018-08-12 NOTE — Patient Outreach (Signed)
Telephone call:  Reviewed medical record from readmission and Cancer center visit.   Primary MD office dose transition of care.  Placed call to patient who was not available. Spoke with Vonna Kotyk ( grandson---previous permission granted by patient.)  Reviewed reason for a call.  PLAN: offered home visit for 08/16/2018 at 9:30 appointment accepted. Requested call 30 minutes prior to arrival for grandson to put up the dogs.   Complete assessment pending: Tomasa Rand, RN, BSN, CEN [facility] [employer] [phone]

## 2018-08-12 NOTE — Progress Notes (Signed)
Oncology Nurse Navigator Documentation  Oncology Nurse Navigator Flowsheets 08/12/2018  Navigator Location [facility]  Navigator Encounter Type Other/order for US thoracentesis clarified with Dr. Servando Snare.  I will contact radiology to update order and pre-procedure instructions  Treatment Phase Pre-Tx/Tx Discussion  Barriers/Navigation Needs Coordination of Care  Interventions Coordination of Care  Coordination of Care Other  Acuity Level 2  Time Spent with Patient 30

## 2018-08-12 NOTE — Telephone Encounter (Signed)
Scheduled appt per 10/10 los -pt grandson is aware of appts added and will get an updated schedule for treatment at chemo edu .

## 2018-08-12 NOTE — Telephone Encounter (Signed)
Oncology Nurse Navigator Documentation  Oncology Nurse Navigator Flowsheets 08/12/2018  Navigator Location [facility]  Navigator Encounter Type Telephone/I called radiology to schedule thoracentesis.  I then called patient's son and gave him the appt time and place and pre-procedure instructions to hold eliquis 2 days prior per Dr. Servando Snare.  He verbalized understanding of appt time and place.   Telephone Outgoing Call  Surgery Date 08/01/2018  Multidisiplinary Clinic Date 08/11/2018  Treatment Initiated Date 08/18/2018  Patient Visit Type MedOnc  Treatment Phase Pre-Tx/Tx Discussion  Barriers/Navigation Needs Education;Coordination of Care  Education Newly Diagnosed Cancer Education;Other  Interventions Coordination of Care;Education  Coordination of Care Appts  Education Method Verbal;Written  Acuity Level 2  Time Spent with Patient 30

## 2018-08-13 ENCOUNTER — Other Ambulatory Visit: Payer: Self-pay | Admitting: Hematology

## 2018-08-13 ENCOUNTER — Inpatient Hospital Stay (HOSPITAL_COMMUNITY): Payer: BLUE CROSS/BLUE SHIELD

## 2018-08-13 ENCOUNTER — Inpatient Hospital Stay (HOSPITAL_COMMUNITY)
Admission: EM | Admit: 2018-08-13 | Discharge: 2018-08-15 | Disposition: A | Discharge status: Home / Self Care | Payer: BLUE CROSS/BLUE SHIELD | Source: Home / Self Care | Attending: Internal Medicine | Admitting: Internal Medicine

## 2018-08-13 ENCOUNTER — Other Ambulatory Visit: Payer: Self-pay

## 2018-08-13 ENCOUNTER — Encounter (HOSPITAL_COMMUNITY): Payer: Self-pay

## 2018-08-13 ENCOUNTER — Emergency Department (HOSPITAL_COMMUNITY): Payer: BLUE CROSS/BLUE SHIELD

## 2018-08-13 DIAGNOSIS — R0603 Acute respiratory distress: Secondary | ICD-10-CM

## 2018-08-13 DIAGNOSIS — C3491 Malignant neoplasm of unspecified part of right bronchus or lung: Secondary | ICD-10-CM

## 2018-08-13 DIAGNOSIS — C3490 Malignant neoplasm of unspecified part of unspecified bronchus or lung: Secondary | ICD-10-CM

## 2018-08-13 DIAGNOSIS — J9610 Chronic respiratory failure, unspecified whether with hypoxia or hypercapnia: Secondary | ICD-10-CM | POA: Diagnosis present

## 2018-08-13 DIAGNOSIS — J9601 Acute respiratory failure with hypoxia: Principal | ICD-10-CM

## 2018-08-13 DIAGNOSIS — D72829 Elevated white blood cell count, unspecified: Secondary | ICD-10-CM

## 2018-08-13 DIAGNOSIS — R0789 Other chest pain: Secondary | ICD-10-CM

## 2018-08-13 DIAGNOSIS — J9621 Acute and chronic respiratory failure with hypoxia: Secondary | ICD-10-CM

## 2018-08-13 DIAGNOSIS — N189 Chronic kidney disease, unspecified: Secondary | ICD-10-CM

## 2018-08-13 DIAGNOSIS — J90 Pleural effusion, not elsewhere classified: Secondary | ICD-10-CM

## 2018-08-13 DIAGNOSIS — I5033 Acute on chronic diastolic (congestive) heart failure: Secondary | ICD-10-CM

## 2018-08-13 DIAGNOSIS — J9600 Acute respiratory failure, unspecified whether with hypoxia or hypercapnia: Secondary | ICD-10-CM | POA: Diagnosis present

## 2018-08-13 LAB — CBC WITH DIFFERENTIAL/PLATELET
Abs Immature Granulocytes: 0.1 10*3/uL — ABNORMAL HIGH (ref 0.00–0.07)
Basophils Absolute: 0.1 10*3/uL (ref 0.0–0.1)
Basophils Relative: 1 %
EOS PCT: 3 %
Eosinophils Absolute: 0.4 10*3/uL (ref 0.0–0.5)
HEMATOCRIT: 30.1 % — AB (ref 39.0–52.0)
HEMOGLOBIN: 9 g/dL — AB (ref 13.0–17.0)
Immature Granulocytes: 1 %
LYMPHS PCT: 7 %
Lymphs Abs: 1 10*3/uL (ref 0.7–4.0)
MCH: 28 pg (ref 26.0–34.0)
MCHC: 29.9 g/dL — ABNORMAL LOW (ref 30.0–36.0)
MCV: 93.5 fL (ref 80.0–100.0)
MONO ABS: 1.1 10*3/uL — AB (ref 0.1–1.0)
Monocytes Relative: 9 %
Neutro Abs: 10.4 10*3/uL — ABNORMAL HIGH (ref 1.7–7.7)
Neutrophils Relative %: 79 %
Platelets: 471 10*3/uL — ABNORMAL HIGH (ref 150–400)
RBC: 3.22 MIL/uL — AB (ref 4.22–5.81)
RDW: 13.5 % (ref 11.5–15.5)
WBC: 13 10*3/uL — ABNORMAL HIGH (ref 4.0–10.5)
nRBC: 0 % (ref 0.0–0.2)

## 2018-08-13 LAB — COMPREHENSIVE METABOLIC PANEL
ALT: 26 U/L (ref 0–44)
AST: 23 U/L (ref 15–41)
Albumin: 2.9 g/dL — ABNORMAL LOW (ref 3.5–5.0)
Alkaline Phosphatase: 65 U/L (ref 38–126)
Anion gap: 10 (ref 5–15)
BUN: 15 mg/dL (ref 8–23)
CHLORIDE: 96 mmol/L — AB (ref 98–111)
CO2: 25 mmol/L (ref 22–32)
CREATININE: 1.65 mg/dL — AB (ref 0.61–1.24)
Calcium: 8.6 mg/dL — ABNORMAL LOW (ref 8.9–10.3)
GFR calc non Af Amer: 38 mL/min — ABNORMAL LOW (ref >60)
GFR, EST AFRICAN AMERICAN: 45 mL/min — AB (ref >60)
Glucose, Bld: 105 mg/dL — ABNORMAL HIGH (ref 70–99)
POTASSIUM: 4.5 mmol/L (ref 3.5–5.1)
SODIUM: 131 mmol/L — AB (ref 135–145)
Total Bilirubin: 0.6 mg/dL (ref 0.3–1.2)
Total Protein: 5.8 g/dL — ABNORMAL LOW (ref 6.5–8.1)

## 2018-08-13 LAB — MAGNESIUM
Magnesium: 1.9 mg/dL (ref 1.7–2.4)
Magnesium: 1.9 mg/dL (ref 1.7–2.4)

## 2018-08-13 LAB — POTASSIUM: Potassium: 4.6 mmol/L (ref 3.5–5.1)

## 2018-08-13 LAB — MRSA PCR SCREENING: MRSA by PCR: NEGATIVE

## 2018-08-13 LAB — TROPONIN I: TROPONIN I: 0.32 ng/mL — AB (ref <0.03)

## 2018-08-13 LAB — PHOSPHORUS: Phosphorus: 3.6 mg/dL (ref 2.5–4.6)

## 2018-08-13 LAB — LACTIC ACID, PLASMA
LACTIC ACID, VENOUS: 2.2 mmol/L — AB (ref 0.5–1.9)
LACTIC ACID, VENOUS: 1 mmol/L (ref 0.5–1.9)

## 2018-08-13 LAB — BRAIN NATRIURETIC PEPTIDE: B Natriuretic Peptide: 756.6 pg/mL — ABNORMAL HIGH (ref 0.0–100.0)

## 2018-08-13 LAB — PROCALCITONIN

## 2018-08-13 MED ORDER — GUAIFENESIN 100 MG/5ML PO SOLN
5.0000 mL | ORAL | Status: DC | PRN
  Administered 2018-08-13 – 2018-08-14 (×4): 100 mg via ORAL
  Filled 2018-08-13 (×4): qty 5

## 2018-08-13 MED ORDER — VITAMIN D 1000 UNITS PO TABS
1000.0000 [IU] | ORAL_TABLET | Freq: Every evening | ORAL | Status: DC
  Administered 2018-08-13 – 2018-08-14 (×2): 1000 [IU] via ORAL
  Filled 2018-08-13 (×2): qty 1

## 2018-08-13 MED ORDER — DEXAMETHASONE SODIUM PHOSPHATE 4 MG/ML IJ SOLN
4.0000 mg | Freq: Four times a day (QID) | INTRAMUSCULAR | Status: DC
  Administered 2018-08-13 – 2018-08-14 (×4): 4 mg via INTRAVENOUS
  Filled 2018-08-13 (×6): qty 1

## 2018-08-13 MED ORDER — FLUTICASONE PROPIONATE 50 MCG/ACT NA SUSP
1.0000 | Freq: Two times a day (BID) | NASAL | Status: DC
  Administered 2018-08-13 – 2018-08-15 (×5): 1 via NASAL
  Filled 2018-08-13: qty 16

## 2018-08-13 MED ORDER — METOPROLOL TARTRATE 25 MG PO TABS
25.0000 mg | ORAL_TABLET | Freq: Two times a day (BID) | ORAL | Status: DC
  Administered 2018-08-13 – 2018-08-15 (×4): 25 mg via ORAL
  Filled 2018-08-13 (×5): qty 1

## 2018-08-13 MED ORDER — ASPIRIN EC 81 MG PO TBEC
81.0000 mg | DELAYED_RELEASE_TABLET | Freq: Every day | ORAL | Status: DC
  Administered 2018-08-13 – 2018-08-15 (×3): 81 mg via ORAL
  Filled 2018-08-13 (×3): qty 1

## 2018-08-13 MED ORDER — APIXABAN 5 MG PO TABS
5.0000 mg | ORAL_TABLET | Freq: Two times a day (BID) | ORAL | Status: DC
  Filled 2018-08-13: qty 1

## 2018-08-13 MED ORDER — VITAMIN E 45 MG (100 UNIT) PO CAPS
100.0000 [IU] | ORAL_CAPSULE | Freq: Every day | ORAL | Status: DC
  Administered 2018-08-13 – 2018-08-14 (×2): 100 [IU] via ORAL
  Filled 2018-08-13 (×2): qty 1

## 2018-08-13 MED ORDER — IPRATROPIUM-ALBUTEROL 0.5-2.5 (3) MG/3ML IN SOLN
3.0000 mL | Freq: Four times a day (QID) | RESPIRATORY_TRACT | Status: DC | PRN
  Administered 2018-08-14: 3 mL via RESPIRATORY_TRACT

## 2018-08-13 MED ORDER — IPRATROPIUM-ALBUTEROL 0.5-2.5 (3) MG/3ML IN SOLN: 3.0000 mL | Freq: Once | RESPIRATORY_TRACT | Status: DC

## 2018-08-13 MED ORDER — SODIUM CHLORIDE 0.9 % IV SOLN
1.0000 g | INTRAVENOUS | Status: DC
  Administered 2018-08-13 – 2018-08-14 (×2): 1 g via INTRAVENOUS
  Filled 2018-08-13 (×3): qty 1

## 2018-08-13 MED ORDER — AMIODARONE HCL 200 MG PO TABS: 200.0000 mg | ORAL_TABLET | Freq: Every day | ORAL | Status: DC

## 2018-08-13 MED ORDER — FUROSEMIDE 10 MG/ML IJ SOLN
40.0000 mg | Freq: Once | INTRAMUSCULAR | Status: AC
  Administered 2018-08-13: 40 mg via INTRAVENOUS
  Filled 2018-08-13: qty 4

## 2018-08-13 MED ORDER — FUROSEMIDE 10 MG/ML IJ SOLN
60.0000 mg | Freq: Two times a day (BID) | INTRAMUSCULAR | Status: DC
  Administered 2018-08-13: 60 mg via INTRAVENOUS
  Filled 2018-08-13 (×2): qty 6

## 2018-08-13 MED ORDER — IPRATROPIUM-ALBUTEROL 0.5-2.5 (3) MG/3ML IN SOLN
3.0000 mL | Freq: Four times a day (QID) | RESPIRATORY_TRACT | Status: DC
  Administered 2018-08-13 – 2018-08-14 (×5): 3 mL via RESPIRATORY_TRACT
  Filled 2018-08-13 (×6): qty 3

## 2018-08-13 MED ORDER — ATORVASTATIN CALCIUM 20 MG PO TABS
20.0000 mg | ORAL_TABLET | Freq: Every day | ORAL | Status: DC
  Administered 2018-08-13 – 2018-08-15 (×3): 20 mg via ORAL
  Filled 2018-08-13 (×3): qty 1

## 2018-08-13 MED ORDER — IPRATROPIUM-ALBUTEROL 0.5-2.5 (3) MG/3ML IN SOLN: 3.0000 mL | Freq: Four times a day (QID) | RESPIRATORY_TRACT | Status: DC | PRN

## 2018-08-13 MED ORDER — AMIODARONE HCL 200 MG PO TABS
200.0000 mg | ORAL_TABLET | Freq: Two times a day (BID) | ORAL | Status: DC
  Administered 2018-08-13 – 2018-08-15 (×5): 200 mg via ORAL
  Filled 2018-08-13 (×5): qty 1

## 2018-08-13 MED ORDER — ADULT MULTIVITAMIN W/MINERALS CH
1.0000 | ORAL_TABLET | Freq: Every day | ORAL | Status: DC
  Administered 2018-08-13 – 2018-08-14 (×2): 1 via ORAL
  Filled 2018-08-13 (×2): qty 1

## 2018-08-13 MED ORDER — AMIODARONE HCL 200 MG PO TABS: 200.0000 mg | ORAL_TABLET | Freq: Two times a day (BID) | ORAL | Status: DC

## 2018-08-13 MED ORDER — LORATADINE 10 MG PO TABS
10.0000 mg | ORAL_TABLET | Freq: Every day | ORAL | Status: DC
  Administered 2018-08-13 – 2018-08-15 (×3): 10 mg via ORAL
  Filled 2018-08-13 (×3): qty 1

## 2018-08-13 NOTE — Progress Notes (Signed)
Pharmacy Antibiotic Note  Gregory Bates is a 77 y.o. male admitted on 08/13/2018 with possible post-obstructive pneumonia. Pharmacy has been consulted for Cefepime dosing.  WBC wnl, CrCl 39, afebrile  Plan: Cefepime 1g every 24 hours Monitor renal function, cultures, and clinical status   Height: 5\' 11"  (180.3 cm) Weight: 192 lb (87.1 kg) IBW/kg (Calculated) : 75.3  Temp (24hrs), Avg:97.8 F (36.6 C), Min:97.6 F (36.4 C), Max:97.9 F (36.6 C)  Recent Labs  Lab 08/09/18 0255 08/09/18 0531 08/13/18 0155 08/13/18 0618 08/13/18 0834  WBC 9.3  --  13.0*  --   --   CREATININE 1.87*  --  1.65*  --   --   LATICACIDVEN 1.2 1.1  --  2.2* 1.0    Estimated Creatinine Clearance: 39.9 mL/min (A) (by C-G formula based on SCr of 1.65 mg/dL (H)).    Allergies  Allergen Reactions  . Brilinta [Ticagrelor] Other (See Comments)    Arthralgias & myalgias  . Crestor [Rosuvastatin] Other (See Comments)    Myalgias    Antimicrobials this admission: Cefepime >>     Microbiology results: Sputum Cx (10/12):   Thank you for allowing pharmacy to be a part of this patient's care. Gwenlyn Found, Sherian Rein D PGY1 Pharmacy Resident  Phone [phone] 08/13/2018   11:03 AM

## 2018-08-13 NOTE — ED Provider Notes (Signed)
[facility] EMERGENCY DEPARTMENT Provider Note   CSN: [id number] Arrival date & time: 08/13/18  0048     History   Chief Complaint Chief Complaint  Patient presents with  . Respiratory Distress    HPI Gregory Bates is a 77 y.o. male.  HPI 77 year old male status post recent CABG here w/ SOB.  The patient states that he had been recovering well over the last 2 weeks since discharge, however throughout the day today he has become increasingly short of breath.  He states that he feels like he has fluid in his lungs and has been "bubbling."  He said increased nonproductive cough.  He is dyspneic at rest.  Symptoms worsen with any kind of exertion or lying flat.  He also endorses general fatigue.  Denies any chest pain.  Is been taking his medications as prescribed.  No fevers or chills.  He has been using his incentive spirometer.  No nausea or vomiting.  He has mild increased lower extremity edema.  Past Medical History:  Diagnosis Date  . Acute renal insufficiency    a. During 11/2013 adm with atrial flutter.  . Anemia, unspecified   . CAD (coronary artery disease), native coronary artery    a. s/p cath May 2011 >> DES distal RCA;  b. 01/2013 NSTEMI >> OM1 99p (2.75x12 Promus Premier DES);  c. LHC (2/15): PCI: Promus DES to dist RCA ;  d. LHC 1/16 - dLM 50, oLAD 30, small D1 tandem 7, pOM1 stent ok, mOM 50-60, dOM bifurcation 50-60 in both vessels, AV 107-70 jailed by stent, pRCA 47 mRCA stent ok, dRCA stent ok, PLB smal 50, dPDA 60, 80; EF 35-40 >> med Rx - PCI of PDA if angina  . Chronic combined systolic and diastolic CHF (congestive heart failure) ([place]) 12/23/2015   Echo 2/17: Mild LVH, EF 45-50%, inferior akinesis, grade 2 diastolic dysfunction, mild AI, mild MR, mild LAE, PASP 44 mmHg   . Colorectal polyps 2002  . Diabetes mellitus    AODM  . Erectile dysfunction   . History of echocardiogram    a. 01/2013 Echo: EF 55%, mid-dist inflat HK, Gr1 DD, Triv AI/TR,  Mild MR.;  b.  Echo (1/15): Mild LVH, EF 55%, inferolateral hypokinesis, grade 1 diastolic dysfunction, mild AI, trivial MR, moderate LAE, PASP 36 mmHg   . Hx of cardiovascular stress test    Stress myoview 08/07/13 with LVEF 44%, inferior scar, no ischemia  . Hyperlipidemia   . Hypertension   . Ischemic cardiomyopathy    a. EF 35-40% by LHC in 1/16; b. Echo 2/17: Mild LVH, EF 45-50%, inferior akinesis, grade 2 diastolic dysfunction, mild AI, mild MR, mild LAE, PASP 44 mmHg  . Myocardial infarction ([place])   . PAF (paroxysmal atrial fibrillation) ([place])    a. Dx 11/2013. Spont conv. Placed on eliquis.  . Small cell lung cancer in adult [facility]) 07/28/2018    Patient Active Problem List   Diagnosis Date Noted  . Small cell carcinoma of right lung ([place]) 08/11/2018  . Encounter for antineoplastic chemotherapy 08/11/2018  . Encounter for antineoplastic immunotherapy 08/11/2018  . Goals of care, counseling/discussion 08/11/2018  . Acute respiratory failure with hypoxia ([place]) 08/09/2018  . Seizure-like activity ([place]) 08/09/2018  . S/P CABG x 3 08/01/2018  . Malignant neoplasm of bronchus of right upper lobe ([place]) 07/28/2018  . PAF (paroxysmal atrial fibrillation) ([place]) 07/26/2018  . CKD (chronic kidney disease), stage III ([place]) 07/26/2018  . Abnormal CXR 07/26/2018  .  Normochromic normocytic anemia   . Hyponatremia 10/15/2017  . Nausea & vomiting 10/15/2017  . Chronic diastolic CHF (congestive heart failure) ([place]) 12/23/2015  . Exertional angina ([facility]) 11/21/2014  . Unstable angina ([place]) 12/26/2013  . Atrial flutter ([place]) 11/03/2013  . CAD (coronary artery disease) 10/02/2012  . Type 2 diabetes mellitus with vascular disease ([place]) 09/18/2010  . Essential hypertension 03/28/2010  . Normocytic anemia 12/04/2008  . COLONIC POLYPS, RECURRENT 04/26/2008  . HYPERLIPIDEMIA 10/07/2007  . ERECTILE DYSFUNCTION 10/07/2007    Past Surgical History:  Procedure Laterality Date  . BIOPSY  08/01/2018    Procedure: BIOPSY OF PERICARDIAL LYMPH NODE AND HILAR LYMPH NODE;  Surgeon: Grace Isaac, MD;  Location: [facility];  Service: Open Heart Surgery;;  . CLIPPING OF ATRIAL APPENDAGE Left 08/01/2018   Procedure: CLIPPING OF ATRIAL APPENDAGE;  Surgeon: Grace Isaac, MD;  Location: [place];  Service: Open Heart Surgery;  Laterality: Left;  . COLONOSCOPY W/ POLYPECTOMY  2002   [facility]  . COLONOSCOPY W/ POLYPECTOMY  2004; 2007 negative  . CORONARY ANGIOPLASTY  12/26/2013  . CORONARY ARTERY BYPASS GRAFT N/A 08/01/2018   Procedure: CORONARY ARTERY BYPASS GRAFTING (CABG) TIMES THREE USING LEFT INTERNAL MAMMARY ARTERY TO LAD, RIGHT GREATER SAPHENOUS VEIN GRAFT TO OM AND RCA, VEIN HARVESTED ENDOSCOPICALLY;  Surgeon: Grace Isaac, MD;  Location: [facility];  Service: Open Heart Surgery;  Laterality: N/A;  . CORONARY STENT PLACEMENT  2007, 2011  . infantile paralysis facial asymmetry    . LEFT HEART CATH AND CORONARY ANGIOGRAPHY N/A 05/18/2017   Procedure: Left Heart Cath and Coronary Angiography;  Surgeon: Martinique, Peter M, MD;  Location: [place] CV LAB;  Service: Cardiovascular;  Laterality: N/A;  . LEFT HEART CATH AND CORONARY ANGIOGRAPHY N/A 07/27/2018   Procedure: LEFT HEART CATH AND CORONARY ANGIOGRAPHY;  Surgeon: Martinique, Peter M, MD;  Location: [facility] CV LAB;  Service: Cardiovascular;  Laterality: N/A;  . LEFT HEART CATHETERIZATION WITH CORONARY ANGIOGRAM N/A 10/02/2012   Procedure: LEFT HEART CATHETERIZATION WITH CORONARY ANGIOGRAM;  Surgeon: Peter M Martinique, MD;  Location: [facility] CATH LAB;  Service: Cardiovascular;  Laterality: N/A;  . LEFT HEART CATHETERIZATION WITH CORONARY ANGIOGRAM N/A 02/20/2013   Procedure: LEFT HEART CATHETERIZATION WITH CORONARY ANGIOGRAM;  Surgeon: Burnell Blanks, MD;  Location: [facility] CATH LAB;  Service: Cardiovascular;  Laterality: N/A;  . LEFT HEART CATHETERIZATION WITH CORONARY ANGIOGRAM N/A 12/26/2013   Procedure: LEFT HEART CATHETERIZATION WITH CORONARY  ANGIOGRAM;  Surgeon: Burnell Blanks, MD;  Location: [facility] CATH LAB;  Service: Cardiovascular;  Laterality: N/A;  . LEFT HEART CATHETERIZATION WITH CORONARY ANGIOGRAM N/A 11/28/2014   Procedure: LEFT HEART CATHETERIZATION WITH CORONARY ANGIOGRAM;  Surgeon: Burnell Blanks, MD;  Location: [facility] CATH LAB;  Service: Cardiovascular;  Laterality: N/A;  . PERCUTANEOUS CORONARY STENT INTERVENTION (PCI-S)  10/02/2012   Procedure: PERCUTANEOUS CORONARY STENT INTERVENTION (PCI-S);  Surgeon: Peter M Martinique, MD;  Location: [facility] CATH LAB;  Service: Cardiovascular;;  . PERCUTANEOUS CORONARY STENT INTERVENTION (PCI-S)  02/20/2013   Procedure: PERCUTANEOUS CORONARY STENT INTERVENTION (PCI-S);  Surgeon: Burnell Blanks, MD;  Location: [facility] CATH LAB;  Service: Cardiovascular;;  . PERCUTANEOUS CORONARY STENT INTERVENTION (PCI-S)  12/26/2013   Procedure: PERCUTANEOUS CORONARY STENT INTERVENTION (PCI-S);  Surgeon: Burnell Blanks, MD;  Location: [facility] CATH LAB;  Service: Cardiovascular;;  . TEE WITHOUT CARDIOVERSION N/A 08/01/2018   Procedure: TRANSESOPHAGEAL ECHOCARDIOGRAM (TEE);  Surgeon: Grace Isaac, MD;  Location: [facility];  Service: Open Heart Surgery;  Laterality:  N/A;  . WISDOM TOOTH EXTRACTION          Home Medications    Prior to Admission medications   Medication Sig Start Date End Date Taking? Authorizing Provider  acetaminophen (TYLENOL) 325 MG tablet Take 2 tablets (650 mg total) by mouth every 6 (six) hours as needed for mild pain. 08/06/18   Nani Skillern, PA-C  amiodarone (PACERONE) 200 MG tablet Take 1 tablet (200 mg total) by mouth 2 (two) times daily. For one week;then take 200 mg daily thereafter 08/06/18   Nani Skillern, PA-C  aspirin EC 81 MG EC tablet Take 1 tablet (81 mg total) by mouth daily. 08/06/18   Nani Skillern, PA-C  atorvastatin (LIPITOR) 20 MG tablet Take 1 tablet (20 mg total) by mouth daily. 11/19/17   Burnell Blanks, MD    cholecalciferol (VITAMIN D) 1000 UNITS tablet Take 1,000 Units by mouth every evening.     [provider]  ELIQUIS 5 MG TABS tablet TAKE 1 TABLET BY MOUTH TWICE DAILY Patient taking differently: Take 5 mg by mouth 2 (two) times daily.  11/24/17   Burnell Blanks, MD  fexofenadine (ALLEGRA) 180 MG tablet Take 180 mg by mouth daily as needed for allergies.     [provider]  fluticasone (FLONASE) 50 MCG/ACT nasal spray Place 1 spray into both nostrils 2 (two) times daily.     [provider]  guaiFENesin (MUCINEX) 600 MG 12 hr tablet Take 1 tablet (600 mg total) by mouth 2 (two) times daily as needed for cough or to loosen phlegm. 08/06/18   Nani Skillern, PA-C  metoprolol tartrate (LOPRESSOR) 25 MG tablet Take 1 tablet (25 mg total) by mouth 2 (two) times daily. 02/21/18   Burnell Blanks, MD  Multiple Vitamin (MULTIVITAMIN) tablet Take 1 tablet by mouth at bedtime.     [provider]  prochlorperazine (COMPAZINE) 10 MG tablet Take 1 tablet (10 mg total) by mouth every 6 (six) hours as needed for nausea or vomiting. 08/11/18   Curt Bears, MD  traMADol (ULTRAM) 50 MG tablet Take 50 mg by mouth every 4-6 hours PRN severe pain 08/06/18   Nani Skillern, PA-C  vitamin E 100 UNIT capsule Take 100 Units by mouth at bedtime.     [provider]    Family History Family History  Problem Relation Age of Onset  . Heart attack Brother 9  . Colon cancer Brother   . Prostate cancer Brother   . Breast cancer Sister   . Stroke Sister   . Diabetes Neg Hx   . Hypertension Neg Hx     Social History Social History   Tobacco Use  . Smoking status: Former Smoker    Last attempt to quit: 11/02/1984    Years since quitting: 33.8  . Smokeless tobacco: Never Used  . Tobacco comment: smoked [place], up to 3 ppd (!)  Substance Use Topics  . Alcohol use: No  . Drug use: No     Allergies   Brilinta [ticagrelor] and  Crestor [rosuvastatin]   Review of Systems Review of Systems  Constitutional: Positive for fatigue. Negative for chills and fever.  HENT: Negative for congestion and rhinorrhea.   Eyes: Negative for visual disturbance.  Respiratory: Positive for cough and shortness of breath. Negative for wheezing.   Cardiovascular: Positive for leg swelling. Negative for chest pain.  Gastrointestinal: Negative for abdominal pain, diarrhea, nausea and vomiting.  Genitourinary: Negative for  dysuria and flank pain.  Musculoskeletal: Negative for neck pain and neck stiffness.  Skin: Negative for rash and wound.  Allergic/Immunologic: Negative for immunocompromised state.  Neurological: Positive for weakness. Negative for syncope and headaches.  All other systems reviewed and are negative.    Physical Exam Updated Vital Signs BP 130/64   Pulse 67   Resp 16   Ht 5\' 11"  (1.803 m)   Wt 87.1 kg   SpO2 92%   BMI 26.78 kg/m   Physical Exam  Constitutional: He is oriented to person, place, and time. He appears well-developed and well-nourished. No distress.  HENT:  Head: Normocephalic and atraumatic.  Eyes: Conjunctivae are normal.  Neck: Neck supple.  Cardiovascular: Normal rate, regular rhythm and normal heart sounds. Exam reveals no friction rub.  No murmur heard. Pulmonary/Chest: Effort normal. Tachypnea noted. No respiratory distress. He has decreased breath sounds in the right upper field. He has no wheezes. He has rales in the right lower field and the left lower field.  Abdominal: He exhibits no distension.  Musculoskeletal: He exhibits no edema.  Neurological: He is alert and oriented to person, place, and time. He exhibits normal muscle tone.  Skin: Skin is warm. Capillary refill takes less than 2 seconds.  Psychiatric: He has a normal mood and affect.  Nursing note and vitals reviewed.    ED Treatments / Results  Labs (all labs ordered are listed, but only abnormal results are  displayed) Labs Reviewed  CBC WITH DIFFERENTIAL/PLATELET - Abnormal; Notable for the following components:      Result Value   WBC 13.0 (*)    RBC 3.22 (*)    Hemoglobin 9.0 (*)    HCT 30.1 (*)    MCHC 29.9 (*)    Platelets 471 (*)    Neutro Abs 10.4 (*)    Monocytes Absolute 1.1 (*)    Abs Immature Granulocytes 0.10 (*)    All other components within normal limits  COMPREHENSIVE METABOLIC PANEL - Abnormal; Notable for the following components:   Sodium 131 (*)    Chloride 96 (*)    Glucose, Bld 105 (*)    Creatinine, Ser 1.65 (*)    Calcium 8.6 (*)    Total Protein 5.8 (*)    Albumin 2.9 (*)    GFR calc non Af Amer 38 (*)    GFR calc Af Amer 45 (*)    All other components within normal limits  BRAIN NATRIURETIC PEPTIDE - Abnormal; Notable for the following components:   B Natriuretic Peptide 756.6 (*)    All other components within normal limits  TROPONIN I - Abnormal; Notable for the following components:   Troponin I 0.32 (*)    All other components within normal limits  MAGNESIUM    EKG EKG Interpretation  Date/Time:  Saturday August 13 2018 00:58:11 EDT Ventricular Rate:  64 PR Interval:    QRS Duration: 103 QT Interval:  568 QTC Calculation: 587 R Axis:   57 Text Interpretation:  Sinus rhythm Low voltage, extremity leads Nonspecific T abnormalities, lateral leads Prolonged QT interval Since last tracing, QT is prolonged Otherwise no significant change Confirmed by Duffy Girard [phone]) on 08/13/2018 1:08:49 AM   Radiology Dg Chest 2 View  Result Date: 08/13/2018 CLINICAL DATA:  Bypass surgery 2 weeks ago. Now shortness of breath. Diagnosed with right upper lobe lung cancer. EXAM: CHEST - 2 VIEW COMPARISON:  08/10/2018 FINDINGS: Right hilar mass with postobstructive consolidation and collapse in the right upper  lung. Small bilateral pleural effusions with basilar atelectasis. Postoperative changes in the mediastinum. Mild cardiac enlargement. No vascular  congestion or edema. IMPRESSION: Right hilar mass with postobstructive consolidation and collapse in the right upper lung. Small bilateral pleural effusions with basilar atelectasis. Electronically Signed   By: Lucienne Capers M.D.   On: 08/13/2018 01:57    Procedures Procedures (including critical care time)  Medications Ordered in ED Medications  furosemide (LASIX) injection 40 mg (has no administration in time range)     Initial Impression / Assessment and Plan / ED Course  I have reviewed the triage vital signs and the nursing notes.  Pertinent labs & imaging results that were available during my care of the patient were reviewed by me and considered in my medical decision making (see chart for details).     77 year old male status post recent CABG and new diagnosis of lung cancer here with shortness of breath.  Clinically, patient appears hypervolemic with bilateral rales.  His BNP is above baseline.  I suspect he has acute on chronic CHF, now complicated by right upper lobe collapse which appears chronic from his recently diagnosed lung cancer.  Patient was scheduled for thoracentesis on Monday.  Will give a dose of Lasix, and plan for admission given his marked dyspnea.  Final Clinical Impressions(s) / ED Diagnoses   Final diagnoses:  Acute on chronic diastolic congestive heart failure [facility])  Respiratory distress    ED Discharge Orders    None       Duffy Falcon, MD 08/13/18 [phone]

## 2018-08-13 NOTE — Progress Notes (Signed)
Pt is alert, looks comfortable, sitting in a recliner pretty much, on 2 l oxygen via [place], oxygen 100% with 2l, vitals stable, ambulating in a room, IV antibiotics and other medicines started late this morning once pharmacy verified.  Wife is in bed side and is updating, on breathing treatments, coughing moderate, MRSA swab sent, will continue to monitor the patient  Palma Holter, RN

## 2018-08-13 NOTE — H&P (Signed)
History and Physical  Gregory Bates GHW:[id number] DOB: 1941-05-17 DOA: 08/13/2018  Referring physician: ER physician PCP: Street, Sharon Mt, MD  Outpatient Specialists: Cardiothoracic surgery, cardiology and oncology Patient coming from: Home  Chief Complaint: Shortness of breath.  HPI: Patient is a 77 year old male, with past medical history significant for paroxysmal atrial fibrillation on anticoagulation (Eliquis), diabetes mellitus, ischemic cardiomyopathy, chronically elevated troponin, previously documented combined systolic and diastolic congestive heart failure with EF of 35 to 40% (however, echocardiogram done on 07/28/2017 revealed an EF of 55 to 60%, with grade 2 diastolic dysfunction), 30 pack years but quit 30 years ago, possibly undiagnosed COPD, Chronic kidney disease stage III, anemia, hypertension, coronary artery disease status post recent CABG on 08/01/2018 and small cell lung cancer.  According to the patient, the right squamous cell cancer was diagnosed duing recent CABG surgery.  Post operative period has been significant for diarrhea, shortness of breath, tonic-clonic seizure-like activity, occlusion of the bronchus with resultant atelectasis and collapse of the right upper lobe of the lung.  CT scan of the chest done on 08/09/2018 revealed right mediastinal mass/adenopathy, with reported vicinity to the superior vena cava with extrinsic mass-effect in the supra vena cava.  Patient was seen by the oncology team recently, and outpatient thoracentesis is planned.  Patient presents to the hospital with sudden onset of shortness of breath that occurred last night while patient was asleep.  No associated fever or chills.  Endorsed nonproductive cough.  No headache, no neck pain, no chest pain, no URI symptoms, no GI symptoms and no urinary symptoms.  Bilateral upper extremity swelling was reported, worse on the right side.  Chest x-ray done on presentation is said to reveal "Right  hilar mass with postobstructive consolidation and collapse in the right upper lung. Small bilateral pleural effusions with basilar atelectasis".  Procalcitonin is less than 0.1.  Lactic acidosis was 2.2.  Serum creatinine is stable at 1.65.  Lipase is 13,000 with neutrophils of 79%.  Hospitalist team was asked to admit patient for further assessment and management.  ED Course: Patient was diuresed by the ER team.  Hospitalist team was called to admit patient.  Pertinent labs: Chemistry reveals sodium of 131, potassium of 4.5, chloride of 96, CO2 of 25, BUN of 15, creatinine of 1.65 and blood sugar of 105.  Albumin is 2.9.  Total protein is 5.8.  Troponin is 0.32.  As mentioned above, and lactic acid is 2.2 with procalcitonin just less than 0.1.  Cardiac BNP 756.6.  CBC reveals WBC of 13, hemoglobin of 9, hematocrit of 30.1, MCV of 93.5 with platelet count of 471.  EKG: Independently reviewed.   Imaging: independently reviewed.    Review of Systems:  Negative for fever, visual changes, sore throat, rash, new muscle aches, chest pain,dysuria, bleeding, n/v/abdominal pain.  Past Medical History:  Diagnosis Date  . Acute renal insufficiency    a. During 11/2013 adm with atrial flutter.  . Anemia, unspecified   . CAD (coronary artery disease), native coronary artery    a. s/p cath May 2011 >> DES distal RCA;  b. 01/2013 NSTEMI >> OM1 99p (2.75x12 Promus Premier DES);  c. LHC (2/15): PCI: Promus DES to dist RCA ;  d. LHC 1/16 - dLM 20, oLAD 30, small D1 tandem 70, pOM1 stent ok, mOM 50-60, dOM bifurcation 50-60 in both vessels, AV 60-70 jailed by stent, pRCA 40 mRCA stent ok, dRCA stent ok, PLB smal 50, dPDA 60, 80; EF 35-40 >> med  Rx - PCI of PDA if angina  . Chronic combined systolic and diastolic CHF (congestive heart failure) ([place]) 12/23/2015   Echo 2/17: Mild LVH, EF 45-50%, inferior akinesis, grade 2 diastolic dysfunction, mild AI, mild MR, mild LAE, PASP 44 mmHg   . Colorectal polyps 2002  .  Diabetes mellitus    AODM  . Erectile dysfunction   . History of echocardiogram    a. 01/2013 Echo: EF 55%, mid-dist inflat HK, Gr1 DD, Triv AI/TR, Mild MR.;  b.  Echo (1/15): Mild LVH, EF 55%, inferolateral hypokinesis, grade 1 diastolic dysfunction, mild AI, trivial MR, moderate LAE, PASP 36 mmHg   . Hx of cardiovascular stress test    Stress myoview 08/07/13 with LVEF 44%, inferior scar, no ischemia  . Hyperlipidemia   . Hypertension   . Ischemic cardiomyopathy    a. EF 35-40% by LHC in 1/16; b. Echo 2/17: Mild LVH, EF 45-50%, inferior akinesis, grade 2 diastolic dysfunction, mild AI, mild MR, mild LAE, PASP 44 mmHg  . Myocardial infarction ([place])   . PAF (paroxysmal atrial fibrillation) ([place])    a. Dx 11/2013. Spont conv. Placed on eliquis.  . Small cell lung cancer in adult [facility]) 07/28/2018    Past Surgical History:  Procedure Laterality Date  . BIOPSY  08/01/2018   Procedure: BIOPSY OF PERICARDIAL LYMPH NODE AND HILAR LYMPH NODE;  Surgeon: Grace Isaac, MD;  Location: [place];  Service: Open Heart Surgery;;  . CLIPPING OF ATRIAL APPENDAGE Left 08/01/2018   Procedure: CLIPPING OF ATRIAL APPENDAGE;  Surgeon: Grace Isaac, MD;  Location: [place];  Service: Open Heart Surgery;  Laterality: Left;  . COLONOSCOPY W/ POLYPECTOMY  2002   [facility]  . COLONOSCOPY W/ POLYPECTOMY  2004; 2007 negative  . CORONARY ANGIOPLASTY  12/26/2013  . CORONARY ARTERY BYPASS GRAFT N/A 08/01/2018   Procedure: CORONARY ARTERY BYPASS GRAFTING (CABG) TIMES THREE USING LEFT INTERNAL MAMMARY ARTERY TO LAD, RIGHT GREATER SAPHENOUS VEIN GRAFT TO OM AND RCA, VEIN HARVESTED ENDOSCOPICALLY;  Surgeon: Grace Isaac, MD;  Location: [place];  Service: Open Heart Surgery;  Laterality: N/A;  . CORONARY STENT PLACEMENT  2007, 2011  . infantile paralysis facial asymmetry    . LEFT HEART CATH AND CORONARY ANGIOGRAPHY N/A 05/18/2017   Procedure: Left Heart Cath and Coronary Angiography;  Surgeon: Martinique, Peter M, MD;   Location: [place] CV LAB;  Service: Cardiovascular;  Laterality: N/A;  . LEFT HEART CATH AND CORONARY ANGIOGRAPHY N/A 07/27/2018   Procedure: LEFT HEART CATH AND CORONARY ANGIOGRAPHY;  Surgeon: Martinique, Peter M, MD;  Location: [place] [facility];  Service: Cardiovascular;  Laterality: N/A;  . LEFT HEART CATHETERIZATION WITH CORONARY ANGIOGRAM N/A 10/02/2012   Procedure: LEFT HEART CATHETERIZATION WITH CORONARY ANGIOGRAM;  Surgeon: Peter M Martinique, MD;  Location: [facility] CATH LAB;  Service: Cardiovascular;  Laterality: N/A;  . LEFT HEART CATHETERIZATION WITH CORONARY ANGIOGRAM N/A 02/20/2013   Procedure: LEFT HEART CATHETERIZATION WITH CORONARY ANGIOGRAM;  Surgeon: Burnell Blanks, MD;  Location: [facility] CATH LAB;  Service: Cardiovascular;  Laterality: N/A;  . LEFT HEART CATHETERIZATION WITH CORONARY ANGIOGRAM N/A 12/26/2013   Procedure: LEFT HEART CATHETERIZATION WITH CORONARY ANGIOGRAM;  Surgeon: Burnell Blanks, MD;  Location: [facility] CATH LAB;  Service: Cardiovascular;  Laterality: N/A;  . LEFT HEART CATHETERIZATION WITH CORONARY ANGIOGRAM N/A 11/28/2014   Procedure: LEFT HEART CATHETERIZATION WITH CORONARY ANGIOGRAM;  Surgeon: Burnell Blanks, MD;  Location: [facility] CATH LAB;  Service: Cardiovascular;  Laterality: N/A;  .  PERCUTANEOUS CORONARY STENT INTERVENTION (PCI-S)  10/02/2012   Procedure: PERCUTANEOUS CORONARY STENT INTERVENTION (PCI-S);  Surgeon: Peter M Martinique, MD;  Location: [place] CATH LAB;  Service: Cardiovascular;;  . PERCUTANEOUS CORONARY STENT INTERVENTION (PCI-S)  02/20/2013   Procedure: PERCUTANEOUS CORONARY STENT INTERVENTION (PCI-S);  Surgeon: Burnell Blanks, MD;  Location: [facility] CATH LAB;  Service: Cardiovascular;;  . PERCUTANEOUS CORONARY STENT INTERVENTION (PCI-S)  12/26/2013   Procedure: PERCUTANEOUS CORONARY STENT INTERVENTION (PCI-S);  Surgeon: Burnell Blanks, MD;  Location: [facility] CATH LAB;  Service: Cardiovascular;;  . TEE WITHOUT CARDIOVERSION N/A 08/01/2018    Procedure: TRANSESOPHAGEAL ECHOCARDIOGRAM (TEE);  Surgeon: Grace Isaac, MD;  Location: [place];  Service: Open Heart Surgery;  Laterality: N/A;  . WISDOM TOOTH EXTRACTION       reports that he quit smoking about 33 years ago. He has never used smokeless tobacco. He reports that he does not drink alcohol or use drugs.  Allergies  Allergen Reactions  . Brilinta [Ticagrelor] Other (See Comments)    Arthralgias & myalgias  . Crestor [Rosuvastatin] Other (See Comments)    Myalgias    Family History  Problem Relation Age of Onset  . Heart attack Brother 89  . Colon cancer Brother   . Prostate cancer Brother   . Breast cancer Sister   . Stroke Sister   . Diabetes Neg Hx   . Hypertension Neg Hx      Prior to Admission medications   Medication Sig Start Date End Date Taking? Authorizing Provider  acetaminophen (TYLENOL) 325 MG tablet Take 2 tablets (650 mg total) by mouth every 6 (six) hours as needed for mild pain. 08/06/18   Nani Skillern, PA-C  amiodarone (PACERONE) 200 MG tablet Take 1 tablet (200 mg total) by mouth 2 (two) times daily. For one week;then take 200 mg daily thereafter 08/06/18   Nani Skillern, PA-C  aspirin EC 81 MG EC tablet Take 1 tablet (81 mg total) by mouth daily. 08/06/18   Nani Skillern, PA-C  atorvastatin (LIPITOR) 20 MG tablet Take 1 tablet (20 mg total) by mouth daily. 11/19/17   Burnell Blanks, MD  cholecalciferol (VITAMIN D) 1000 UNITS tablet Take 1,000 Units by mouth every evening.     [provider]  ELIQUIS 5 MG TABS tablet TAKE 1 TABLET BY MOUTH TWICE DAILY Patient taking differently: Take 5 mg by mouth 2 (two) times daily.  11/24/17   Burnell Blanks, MD  fexofenadine (ALLEGRA) 180 MG tablet Take 180 mg by mouth daily as needed for allergies.     [provider]  fluticasone (FLONASE) 50 MCG/ACT nasal spray Place 1 spray into both nostrils 2 (two) times daily.     [provider]    guaiFENesin (MUCINEX) 600 MG 12 hr tablet Take 1 tablet (600 mg total) by mouth 2 (two) times daily as needed for cough or to loosen phlegm. 08/06/18   Nani Skillern, PA-C  metoprolol tartrate (LOPRESSOR) 25 MG tablet Take 1 tablet (25 mg total) by mouth 2 (two) times daily. 02/21/18   Burnell Blanks, MD  Multiple Vitamin (MULTIVITAMIN) tablet Take 1 tablet by mouth at bedtime.     [provider]  prochlorperazine (COMPAZINE) 10 MG tablet Take 1 tablet (10 mg total) by mouth every 6 (six) hours as needed for nausea or vomiting. 08/11/18   Curt Bears, MD  traMADol (ULTRAM) 50 MG tablet Take 50 mg by mouth every 4-6 hours PRN severe pain 08/06/18  Lars Pinks M, PA-C  vitamin E 100 UNIT capsule Take 100 Units by mouth at bedtime.     [provider]    Physical Exam: Vitals:   08/13/18 0445 08/13/18 0530 08/13/18 0648 08/13/18 0911  BP: 130/64 135/63 (!) 107/51 122/61  Pulse: 67 70 70 70  Resp: 16 12 16 18   Temp:   97.9 F (36.6 C) 97.6 F (36.4 C)  TempSrc:   Oral Oral  SpO2: 92% 95% 100% 90%  Weight:      Height:        Constitutional:  . Appears calm and comfortable. Eyes:  . Pallor. No jaundice.  ENMT:  . external ears, nose appear normal Neck:  . Neck is supple.  JVD is difficult to assess.   Respiratory:  . Decreased air entry right lung field posteriorly, with inspiratory and expiratory wheeze.   Cardiovascular:  . S1S2 . Edema of the upper extremities.     Abdomen:  . Abdomen is soft and non tender. Organs are difficult to assess. Neurologic:  . Awake and alert. . Moves all limbs.  Wt Readings from Last 3 Encounters:  08/13/18 87.1 kg  08/11/18 89.6 kg  08/09/18 81.1 kg    I have personally reviewed following labs and imaging studies  Labs on Admission:  CBC: Recent Labs  Lab 08/09/18 0255 08/13/18 0155  WBC 9.3 13.0*  NEUTROABS 7.2 10.4*  HGB 8.5* 9.0*  HCT 27.1* 30.1*  MCV 91.2 93.5  PLT 367 471*    Basic Metabolic Panel: Recent Labs  Lab 08/09/18 0255 08/09/18 0531 08/13/18 0155  NA 133*  --  131*  K 4.0  --  4.5  CL 98  --  96*  CO2 25  --  25  GLUCOSE 131*  --  105*  BUN 23  --  15  CREATININE 1.87*  --  1.65*  CALCIUM 8.0*  --  8.6*  MG  --  1.9 1.9   Liver Function Tests: Recent Labs  Lab 08/09/18 0255 08/13/18 0155  AST 21 23  ALT 29 26  ALKPHOS 52 65  BILITOT 0.8 0.6  PROT 5.2* 5.8*  ALBUMIN 2.7* 2.9*   No results for input(s): LIPASE, AMYLASE in the last 168 hours. Recent Labs  Lab 08/09/18 1218  AMMONIA 17   Coagulation Profile: No results for input(s): INR, PROTIME in the last 168 hours. Cardiac Enzymes: Recent Labs  Lab 08/09/18 0255 08/09/18 0531 08/09/18 1218 08/13/18 0155  TROPONINI 0.35* 0.38* 0.37* 0.32*   BNP (last 3 results) No results for input(s): PROBNP in the last 8760 hours. HbA1C: No results for input(s): HGBA1C in the last 72 hours. CBG: Recent Labs  Lab 08/09/18 1159 08/09/18 1640 08/09/18 2126 08/10/18 0643 08/10/18 1139  GLUCAP 123* 116* 116* 100* 134*   Lipid Profile: No results for input(s): CHOL, HDL, LDLCALC, TRIG, CHOLHDL, LDLDIRECT in the last 72 hours. Thyroid Function Tests: No results for input(s): TSH, T4TOTAL, FREET4, T3FREE, THYROIDAB in the last 72 hours. Anemia Panel: No results for input(s): VITAMINB12, FOLATE, FERRITIN, TIBC, IRON, RETICCTPCT in the last 72 hours. Urine analysis:    Component Value Date/Time   COLORURINE YELLOW 07/31/2018 2035   APPEARANCEUR CLEAR 07/31/2018 2035   LABSPEC 1.017 07/31/2018 2035   PHURINE 5.0 07/31/2018 2035   GLUCOSEU NEGATIVE 07/31/2018 2035   HGBUR NEGATIVE 07/31/2018 2035   BILIRUBINUR NEGATIVE 07/31/2018 2035   KETONESUR NEGATIVE 07/31/2018 2035   PROTEINUR NEGATIVE 07/31/2018 2035   NITRITE NEGATIVE 07/31/2018  2035   LEUKOCYTESUR NEGATIVE 07/31/2018 2035   Sepsis Labs: @LABRCNTIP (procalcitonin:4,lacticidven:4) )No results found for this or any  previous visit (from the past 240 hour(s)).    Radiological Exams on Admission: Dg Chest 2 View  Result Date: 08/13/2018 CLINICAL DATA:  Bypass surgery 2 weeks ago. Now shortness of breath. Diagnosed with right upper lobe lung cancer. EXAM: CHEST - 2 VIEW COMPARISON:  08/10/2018 FINDINGS: Right hilar mass with postobstructive consolidation and collapse in the right upper lung. Small bilateral pleural effusions with basilar atelectasis. Postoperative changes in the mediastinum. Mild cardiac enlargement. No vascular congestion or edema. IMPRESSION: Right hilar mass with postobstructive consolidation and collapse in the right upper lung. Small bilateral pleural effusions with basilar atelectasis. Electronically Signed   By: Lucienne Capers M.D.   On: 08/13/2018 01:57    EKG: Independently reviewed.   Active Problems:   Acute on chronic respiratory failure (HCC)   Acute respiratory failure (HCC)   Assessment/Plan Acute respiratory failure/shortness of breath: -Etiology is likely multifactorial. -Patient has right-sided lung cancer, with obstruction of the bronchus and associated atelectasis and collapse of the right upper lobe. -Pleural effusion reported, possibly worsening, likely malignant. -Possible early SVC syndrome. -Possible acute on chronic diastolic heart failure. -Possible malignancy related hypoxia. Continue work-up Oncology consulted Aggressive diuresis Further imaging studies (discussed with the oncologist on-call, for likely repeat CT chest) IV steroids Nebs DuoNeb If SVC syndrome is confirmed, likely radiation. Sputum culture IV antibiotics Further management will depend on hospital course.  Squamous cell carcinoma of right lung, with obstruction of the bronchus and collapse of right upper lobe of the lung: Oncology already consulted. Kindly see above. Monitor closely for possible postobstructive pneumonia (However, procalcitonin is less than 0.01).  Possible  postobstructive pneumonia: This is less likely for now, but may evolve. However, we will continue to work-up. Follow cultures.  Possible acute on chronic diastolic congestive heart failure: Prior documentation of ischemic cardiomyopathy and combined diastolic and systolic congestive heart failure (as per prior echo) -Most recent echo revealed diastolic dysfunction, with EF of 55 to 60%. -IV Lasix 60 mg twice daily. -Continue to assess.  Chronic kidney disease stage III: This is stable from known.  Paroxysmal atrial fibrillation: Continue Eliquis. Rate controlled.  Diabetes mellitus: Continue to optimize.  Anemia: Likely multifactorial. Continue to monitor.  Coronary artery disease status post recent CABG: Continue to monitor expectantly. Low threshold to reconsult the cardiothoracic team.   DVT prophylaxis: Code Status: [place] Family Communication:  Disposition Plan: Will depend on hospital course Consults called: Oncology Admission status: Inpatient   Patient is to be managed on inpatient basis.  Patient has multi-comorbidities.  Work-up of shortness of breath is still in progress.  Patient's condition is such that patient would likely deteriorate if not managed on an inpatient basis.  Time spent: 65 minutes.   Dana Allan, MD  [facility] Pager #: [phone] 7PM-7AM contact night coverage as above  08/13/2018, 10:32 AM

## 2018-08-13 NOTE — ED Notes (Signed)
Tech called lab about lab work at [employer] and now all labs are in process. Blood work had been sent 45 mins before.

## 2018-08-13 NOTE — ED Notes (Signed)
Pt transported to XR.  

## 2018-08-13 NOTE — Progress Notes (Signed)
Lactid acid 2.2 , informed MD

## 2018-08-13 NOTE — ED Triage Notes (Signed)
Per pt he had triple bypass two weeks ago and and woke up and noticed he felt full and hard to breath. Pt was diagnosed with right upper lobe cancer. Pt sounds very congested and congested and has a cough. 92/54 and was placed on 2L nasal canula.

## 2018-08-13 NOTE — Consult Note (Addendum)
[place] NOTE  Patient Care Team: Street, Sharon Mt, MD as PCP - General (Family Medicine) Burnell Blanks, MD as PCP - Cardiology (Cardiology) Thana Ates, RN as [facility]  HEME/ONC OVERVIEW: 1. Extensive stage small cell carcinoma   ASSESSMENT & PLAN:  Acute on chronic dyspnea -I independently reviewed the radiologic images of recent CTs and chest x-ray on admission -Chest x-ray showed the right hilar mass with obstructive consolidation and collapse in the right upper lung as well as bilateral pleural effusion, right slightly larger than left. -Patient does not have any suspicious symptom for SVC syndrome at this time, such as headache, vision change, facial/neck swelling or pain, hoarseness of voice or dysphasia. -Repeat CT chest today showed enlarging R pleural effusion; while SVC could not be evaluated due to lack of contrast (CKD), the patient's symptoms do not raise high suspicion for SVC syndrome -Recommend consulting thoracic surgery for thoracentesis  -Okay to discontinue steroid for now  Leukocytosis -CT chest today showed slightly increasing RUL endobronchial compression due to mass effect -I agree with continuing empiric abx for now, and depending on his response to thoracentesis, the abx duration can be reassessed  Extensive stage small cell carcinoma -Patient is scheduled for PET and CT simulation on 08/18/2018 -If the patient's dyspnea does not improve with thoracentesis, we can consider consulting radiation oncology for possible palliative RT to the mass adjacent to the SVC  Goals of Care -Patient asked about the diagnosis of extensive stage small cell carcinoma and what were the odds of his cure with treatment -I briefly reinforced with the patient that the extensive stage small cell carcinoma is incurable and that the treatment would be for palliative intent  A total of more than 45 minutes were  spent face-to-face with the patient during this encounter and over half of that time was spent on counseling and coordination of care as outlined above.    All questions were answered. The patient knows to call the clinic with any problems, questions or concerns.  Tish Men, MD 08/13/2018 12:51 PM   CHIEF COMPLAINTS/PURPOSE OF CONSULTATION:  "I am here for worsening shortness of breath"  HISTORY OF PRESENTING ILLNESS:  Gregory Bates 77 y.o. male is here because of acute on chronic dyspnea.   [name] was recently seen in the oncology clinic by Dr. Julien Nordmann for newly diagnosed extensive stage small cell carcinoma of the lung that was discovered during CABG for multivessel coronary artery disease.  CT chest in early October 2019 showed pleural effusion, for which patient was set up for IR thoracentesis on 08/15/2018.  Patient reports that he has mild dyspnea at baseline, but woke up last night with worsening shortness of breath, prompting him to come to the ER for further evaluation.  Since admission, he reports that his dyspnea has slightly improved, but has not returned to the baseline.  He denies any headache, vision change, facial/neck swelling or pain, chest pain, hoarseness of voice, or dysphagia.  Oncology was consulted for possible SVC syndrome and further management of recently diagnosed extensive stage small cell carcinoma.    I have reviewed his chart and materials related to his cancer extensively and collaborated history with the patient. Summary of oncologic history is as follows:   Small cell carcinoma of right lung ([place])   08/11/2018 Initial Diagnosis    Small cell carcinoma of right lung ([place])    08/29/2018 -  Chemotherapy    The  patient had palonosetron (ALOXI) injection 0.25 mg, 0.25 mg, Intravenous,  Once, 0 of 4 cycles pegfilgrastim-cbqv (UDENYCA) injection 6 mg, 6 mg, Subcutaneous, Once, 0 of 4 cycles CARBOplatin (PARAPLATIN) 330 mg in sodium chloride 0.9 % 100 mL chemo  infusion, 330 mg (original dose ), Intravenous,  Once, 0 of 4 cycles Dose modification: 334.5 mg (Cycle 1) etoposide (VEPESID) 210 mg in sodium chloride 0.9 % 600 mL chemo infusion, 100 mg/m2 = 210 mg, Intravenous,  Once, 0 of 4 cycles fosaprepitant (EMEND) 150 mg, dexamethasone (DECADRON) 12 mg in sodium chloride 0.9 % 145 mL IVPB, , Intravenous,  Once, 0 of 4 cycles atezolizumab (TECENTRIQ) 1,200 mg in sodium chloride 0.9 % 250 mL chemo infusion, 1,200 mg, Intravenous, Once, 0 of 8 cycles  for chemotherapy treatment.      MEDICAL HISTORY:  Past Medical History:  Diagnosis Date  . Acute renal insufficiency    a. During 11/2013 adm with atrial flutter.  . Anemia, unspecified   . CAD (coronary artery disease), native coronary artery    a. s/p cath May 2011 >> DES distal RCA;  b. 01/2013 NSTEMI >> OM1 99p (2.75x12 Promus Premier DES);  c. LHC (2/15): PCI: Promus DES to dist RCA ;  d. LHC 1/16 - dLM 79, oLAD 30, small D1 tandem 27, pOM1 stent ok, mOM 50-60, dOM bifurcation 50-60 in both vessels, AV 21-70 jailed by stent, pRCA 66 mRCA stent ok, dRCA stent ok, PLB smal 50, dPDA 60, 80; EF 35-40 >> med Rx - PCI of PDA if angina  . Chronic combined systolic and diastolic CHF (congestive heart failure) ([place]) 12/23/2015   Echo 2/17: Mild LVH, EF 45-50%, inferior akinesis, grade 2 diastolic dysfunction, mild AI, mild MR, mild LAE, PASP 44 mmHg   . Colorectal polyps 2002  . Diabetes mellitus    AODM  . Erectile dysfunction   . History of echocardiogram    a. 01/2013 Echo: EF 55%, mid-dist inflat HK, Gr1 DD, Triv AI/TR, Mild MR.;  b.  Echo (1/15): Mild LVH, EF 55%, inferolateral hypokinesis, grade 1 diastolic dysfunction, mild AI, trivial MR, moderate LAE, PASP 36 mmHg   . Hx of cardiovascular stress test    Stress myoview 08/07/13 with LVEF 44%, inferior scar, no ischemia  . Hyperlipidemia   . Hypertension   . Ischemic cardiomyopathy    a. EF 35-40% by LHC in 1/16; b. Echo 2/17: Mild LVH, EF 45-50%,  inferior akinesis, grade 2 diastolic dysfunction, mild AI, mild MR, mild LAE, PASP 44 mmHg  . Myocardial infarction ([place])   . PAF (paroxysmal atrial fibrillation) ([place])    a. Dx 11/2013. Spont conv. Placed on eliquis.  . Small cell lung cancer in adult [facility]) 07/28/2018    SURGICAL HISTORY: Past Surgical History:  Procedure Laterality Date  . BIOPSY  08/01/2018   Procedure: BIOPSY OF PERICARDIAL LYMPH NODE AND HILAR LYMPH NODE;  Surgeon: Grace Isaac, MD;  Location: [place];  Service: Open Heart Surgery;;  . CLIPPING OF ATRIAL APPENDAGE Left 08/01/2018   Procedure: CLIPPING OF ATRIAL APPENDAGE;  Surgeon: Grace Isaac, MD;  Location: [facility];  Service: Open Heart Surgery;  Laterality: Left;  . COLONOSCOPY W/ POLYPECTOMY  2002   [facility]  . COLONOSCOPY W/ POLYPECTOMY  2004; 2007 negative  . CORONARY ANGIOPLASTY  12/26/2013  . CORONARY ARTERY BYPASS GRAFT N/A 08/01/2018   Procedure: CORONARY ARTERY BYPASS GRAFTING (CABG) TIMES THREE USING LEFT INTERNAL MAMMARY ARTERY TO LAD, RIGHT GREATER SAPHENOUS VEIN GRAFT  TO OM AND RCA, VEIN HARVESTED ENDOSCOPICALLY;  Surgeon: Grace Isaac, MD;  Location: [facility];  Service: Open Heart Surgery;  Laterality: N/A;  . CORONARY STENT PLACEMENT  2007, 2011  . infantile paralysis facial asymmetry    . LEFT HEART CATH AND CORONARY ANGIOGRAPHY N/A 05/18/2017   Procedure: Left Heart Cath and Coronary Angiography;  Surgeon: Martinique, Peter M, MD;  Location: [place] CV LAB;  Service: Cardiovascular;  Laterality: N/A;  . LEFT HEART CATH AND CORONARY ANGIOGRAPHY N/A 07/27/2018   Procedure: LEFT HEART CATH AND CORONARY ANGIOGRAPHY;  Surgeon: Martinique, Peter M, MD;  Location: [place] CV LAB;  Service: Cardiovascular;  Laterality: N/A;  . LEFT HEART CATHETERIZATION WITH CORONARY ANGIOGRAM N/A 10/02/2012   Procedure: LEFT HEART CATHETERIZATION WITH CORONARY ANGIOGRAM;  Surgeon: Peter M Martinique, MD;  Location: [facility] CATH LAB;  Service: Cardiovascular;  Laterality: N/A;   . LEFT HEART CATHETERIZATION WITH CORONARY ANGIOGRAM N/A 02/20/2013   Procedure: LEFT HEART CATHETERIZATION WITH CORONARY ANGIOGRAM;  Surgeon: Burnell Blanks, MD;  Location: [facility] CATH LAB;  Service: Cardiovascular;  Laterality: N/A;  . LEFT HEART CATHETERIZATION WITH CORONARY ANGIOGRAM N/A 12/26/2013   Procedure: LEFT HEART CATHETERIZATION WITH CORONARY ANGIOGRAM;  Surgeon: Burnell Blanks, MD;  Location: [facility] CATH LAB;  Service: Cardiovascular;  Laterality: N/A;  . LEFT HEART CATHETERIZATION WITH CORONARY ANGIOGRAM N/A 11/28/2014   Procedure: LEFT HEART CATHETERIZATION WITH CORONARY ANGIOGRAM;  Surgeon: Burnell Blanks, MD;  Location: [facility] CATH LAB;  Service: Cardiovascular;  Laterality: N/A;  . PERCUTANEOUS CORONARY STENT INTERVENTION (PCI-S)  10/02/2012   Procedure: PERCUTANEOUS CORONARY STENT INTERVENTION (PCI-S);  Surgeon: Peter M Martinique, MD;  Location: [facility] CATH LAB;  Service: Cardiovascular;;  . PERCUTANEOUS CORONARY STENT INTERVENTION (PCI-S)  02/20/2013   Procedure: PERCUTANEOUS CORONARY STENT INTERVENTION (PCI-S);  Surgeon: Burnell Blanks, MD;  Location: [facility] CATH LAB;  Service: Cardiovascular;;  . PERCUTANEOUS CORONARY STENT INTERVENTION (PCI-S)  12/26/2013   Procedure: PERCUTANEOUS CORONARY STENT INTERVENTION (PCI-S);  Surgeon: Burnell Blanks, MD;  Location[facility] CATH LAB;  Service: Cardiovascular;;  . TEE WITHOUT CARDIOVERSION N/A 08/01/2018   Procedure: TRANSESOPHAGEAL ECHOCARDIOGRAM (TEE);  Surgeon: Grace Isaac, MD;  Location: [place];  Service: Open Heart Surgery;  Laterality: N/A;  . WISDOM TOOTH EXTRACTION      SOCIAL HISTORY: Social History   Socioeconomic History  . Marital status: Married    Spouse name: Not on file  . Number of children: 1  . Years of education: Not on file  . Highest education level: Not on file  Occupational History  . Occupation: maintenance    Comment: full time  Social Needs  . Financial resource strain: Not hard at  all  . Food insecurity:    Worry: Never true    Inability: Never true  . Transportation needs:    Medical: Yes    Non-medical: Yes  Tobacco Use  . Smoking status: Former Smoker    Last attempt to quit: 11/02/1984    Years since quitting: 33.8  . Smokeless tobacco: Never Used  . Tobacco comment: smoked [place], up to 3 ppd (!)  Substance and Sexual Activity  . Alcohol use: No  . Drug use: No  . Sexual activity: Not on file  Lifestyle  . Physical activity:    Days per week: Not on file    Minutes per session: Not on file  . Stress: Not at all  Relationships  . Social connections:    Talks on phone: Not  on file    Gets together: Not on file    Attends religious service: Not on file    Active member of club or organization: Not on file    Attends meetings of clubs or organizations: Not on file    Relationship status: Not on file  . Intimate partner violence:    Fear of current or ex partner: Not on file    Emotionally abused: Not on file    Physically abused: Not on file    Forced sexual activity: Not on file  Other Topics Concern  . Not on file  Social History Narrative   [employer] - does maintenance    FAMILY HISTORY: Family History  Problem Relation Age of Onset  . Heart attack Brother 64  . Colon cancer Brother   . Prostate cancer Brother   . Breast cancer Sister   . Stroke Sister   . Diabetes Neg Hx   . Hypertension Neg Hx     ALLERGIES:  is allergic to brilinta [ticagrelor] and crestor [rosuvastatin].  MEDICATIONS:  Current Facility-Administered Medications  Medication Dose Route Frequency Provider Last Rate Last Dose  . amiodarone (PACERONE) tablet 200 mg  200 mg Oral BID Dana Allan I, MD      . apixaban Arne Cleveland) tablet 5 mg  5 mg Oral BID Dana Allan I, MD      . aspirin EC tablet 81 mg  81 mg Oral Daily Dana Allan I, MD      . atorvastatin (LIPITOR) tablet 20 mg  20 mg Oral Daily Dana Allan I, MD      . ceFEPIme  (MAXIPIME) 1 g in sodium chloride 0.9 % 100 mL IVPB  1 g Intravenous Q24H Nimer, Ignatius Specking, RPH      . cholecalciferol (VITAMIN D) tablet 1,000 Units  1,000 Units Oral QPM Dana Allan I, MD      . dexamethasone (DECADRON) injection 4 mg  4 mg Intravenous Q6H Dana Allan I, MD      . fluticasone (FLONASE) 50 MCG/ACT nasal spray 1 spray  1 spray Each Nare BID Dana Allan I, MD      . furosemide (LASIX) injection 60 mg  60 mg Intravenous BID Dana Allan I, MD      . ipratropium-albuterol (DUONEB) 0.5-2.5 (3) MG/3ML nebulizer solution 3 mL  3 mL Nebulization Once Duffy Alexios, MD      . ipratropium-albuterol (DUONEB) 0.5-2.5 (3) MG/3ML nebulizer solution 3 mL  3 mL Nebulization Q6H PRN Smith, Rondell A, MD      . ipratropium-albuterol (DUONEB) 0.5-2.5 (3) MG/3ML nebulizer solution 3 mL  3 mL Nebulization Q6H Dana Allan I, MD      . ipratropium-albuterol (DUONEB) 0.5-2.5 (3) MG/3ML nebulizer solution 3 mL  3 mL Nebulization Q6H PRN Dana Allan I, MD      . loratadine (CLARITIN) tablet 10 mg  10 mg Oral Daily Dana Allan I, MD      . metoprolol tartrate (LOPRESSOR) tablet 25 mg  25 mg Oral BID Dana Allan I, MD      . multivitamin tablet 1 tablet  1 tablet Oral QHS Dana Allan I, MD      . vitamin E capsule 100 Units  100 Units Oral QHS Dana Allan I, MD        REVIEW OF SYSTEMS:   Constitutional: ( - ) fevers, ( - )  chills , ( - ) night sweats Eyes: ( - ) blurriness of vision, ( - )  double vision, ( - ) watery eyes Ears, nose, mouth, throat, and face: ( - ) mucositis, ( - ) sore throat Respiratory: ( - ) cough, ( + ) dyspnea, ( + ) wheezes Cardiovascular: ( - ) palpitation, ( - ) chest discomfort, ( - ) lower extremity swelling Gastrointestinal:  ( - ) nausea, ( - ) heartburn, ( - ) change in bowel habits Skin: ( - ) abnormal skin rashes Lymphatics: ( - ) new lymphadenopathy, ( - ) easy bruising Neurological: ( - ) numbness, ( - )  tingling, ( - ) new weaknesses Behavioral/Psych: ( - ) mood change, ( - ) new changes  All other systems were reviewed with the patient and are negative.  PHYSICAL EXAMINATION: ECOG PERFORMANCE STATUS: 2 - Symptomatic, <50% confined to bed  Vitals:   08/13/18 0648 08/13/18 0911  BP: (!) 107/51 122/61  Pulse: 70 70  Resp: 16 18  Temp: 97.9 F (36.6 C) 97.6 F (36.4 C)  SpO2: 100% 90%   Filed Weights   08/13/18 0103  Weight: 192 lb (87.1 kg)    GENERAL: alert, no distress and comfortable wearing supplemental O2  SKIN: skin color, texture, turgor are normal, no rashes or significant lesions EYES: conjunctiva are pink and non-injected, sclera clear OROPHARYNX: no exudate, no erythema; lips, buccal mucosa, and tongue normal; no apparent facial swelling or plethora   NECK: supple, non-tender, no apparent neck swelling LYMPH:  no palpable lymphadenopathy in the cervical or axillary LUNGS: decreased air movement in bilateral lung bases, R > L; dullness to percussion in the right lung base up to 1/2 from the lung base, no coarse rhonchi or rales  HEART: regular rate & rhythm, 2/6 holosystolic murmur, no lower extremity edema ABDOMEN: soft, non-tender, non-distended, normal bowel sounds Musculoskeletal: no cyanosis of digits and no clubbing  PSYCH: alert & oriented x 3, fluent speech NEURO: no focal motor/sensory deficits  LABORATORY DATA:  I have reviewed the data as listed Lab Results  Component Value Date   WBC 13.0 (H) 08/13/2018   HGB 9.0 (L) 08/13/2018   HCT 30.1 (L) 08/13/2018   MCV 93.5 08/13/2018   PLT 471 (H) 08/13/2018   Lab Results  Component Value Date   NA 131 (L) 08/13/2018   K 4.5 08/13/2018   CL 96 (L) 08/13/2018   CO2 25 08/13/2018   RADIOGRAPHIC STUDIES: I have personally reviewed the radiological images as listed and agreed with the findings in the report. Dg Chest 2 View  Result Date: 08/13/2018 CLINICAL DATA:  Bypass surgery 2 weeks ago. Now  shortness of breath. Diagnosed with right upper lobe lung cancer. EXAM: CHEST - 2 VIEW COMPARISON:  08/10/2018 FINDINGS: Right hilar mass with postobstructive consolidation and collapse in the right upper lung. Small bilateral pleural effusions with basilar atelectasis. Postoperative changes in the mediastinum. Mild cardiac enlargement. No vascular congestion or edema. IMPRESSION: Right hilar mass with postobstructive consolidation and collapse in the right upper lung. Small bilateral pleural effusions with basilar atelectasis. Electronically Signed   By: Lucienne Capers M.D.   On: 08/13/2018 01:57   Dg Chest 2 View  Result Date: 08/05/2018 CLINICAL DATA:  CABG. EXAM: CHEST - 2 VIEW COMPARISON:  08/03/2018. FINDINGS: Interim removal of right IJ sheath. Prior CABG. Left atrial appendage clip noted in stable position. Stable cardiomegaly. Right upper lobe atelectatic changes, progressed from prior exam. Small bilateral pleural effusions, progressed from prior exam. No pneumothorax. IMPRESSION: 1.  Interim removal of right IJ sheath.  No pneumothorax. 2. Prior CABG. Left atrial appendage clip in stable position. Stable cardiomegaly. 3. Progressive right upper lobe atelectasis. Slight progression of bilateral small pleural effusions. Electronically Signed   By: Marcello Moores  Register   On: 08/05/2018 08:23   Dg Chest 2 View  Result Date: 07/26/2018 CLINICAL DATA:  Chest pain EXAM: CHEST - 2 VIEW COMPARISON:  07/24/2018 chest radiograph. FINDINGS: Normal heart size. Prominence of the right paratracheal stripe/right superior hilar contour. Aortic atherosclerosis. No pneumothorax. No pleural effusion. Hyperinflated lungs. IMPRESSION: Prominence of the right paratracheal stripe/right suprahilar contour. Recommend chest CT with IV contrast to exclude adenopathy/lung mass. Hyperinflated lungs, suggesting COPD. Electronically Signed   By: Ilona Sorrel M.D.   On: 07/26/2018 01:14   Dg Chest 2 View  Result Date:  07/24/2018 CLINICAL DATA:  Pt reports cough x yesterday and left sided chest pain for over 1 hour; Hx of MI w/ 5 stents, DM, and HTN; former smoker EXAM: CHEST - 2 VIEW COMPARISON:  07/19/2018 FINDINGS: Cardiac silhouette is normal in size and configuration no mediastinal or hilar masses. No evidence of adenopathy. Clear lungs.  No pleural effusion or pneumothorax. Skeletal structures are demineralized but intact. IMPRESSION: No active cardiopulmonary disease. Electronically Signed   By: Lajean Manes M.D.   On: 07/24/2018 12:17   Ct Head Wo Contrast  Result Date: 08/09/2018 CLINICAL DATA:  Altered mental status EXAM: CT HEAD WITHOUT CONTRAST TECHNIQUE: Contiguous axial images were obtained from the base of the skull through the vertex without intravenous contrast. COMPARISON:  Head CT 07/31/2009 FINDINGS: Brain: There is no hemorrhage or extra-axial collection. Unchanged appearance of calcified meningioma at the left sphenoid wing. The size and configuration of the ventricles and extra-axial CSF spaces are normal. There is no acute or chronic infarction. There is hypoattenuation of the periventricular white matter, most commonly indicating chronic ischemic microangiopathy. Vascular: Atherosclerotic calcification of the internal carotid arteries at the skull base. No abnormal hyperdensity of the major intracranial arteries or dural venous sinuses. Skull: The visualized skull base, calvarium and extracranial soft tissues are normal. Sinuses/Orbits: Left-greater-than-right maxillary sinus opacification with fluid levels. There is also a left mastoid effusion. The orbits are normal. IMPRESSION: 1. No acute intracranial abnormality. 2. Unchanged appearance of calcified left sphenoid wing meningioma. 3. Maxillary sinus opacification with fluid levels. Correlate for symptoms of acute sinusitis. Electronically Signed   By: Ulyses Jarred M.D.   On: 08/09/2018 02:19   Ct Head Wo Contrast  Result Date:  07/30/2018 CLINICAL DATA:  Post heart catheterization now with subacute neurologic defects. EXAM: CT HEAD WITHOUT CONTRAST TECHNIQUE: Contiguous axial images were obtained from the base of the skull through the vertex without intravenous contrast. COMPARISON:  11/02/2017 FINDINGS: Brain: Similar findings of atrophy with centralized volume loss and mild commensurate ex vacuo dilatation the ventricular system. Re demonstrated scattered periventricular hypodensities, most conspicuous about the left centrum semiovale compatible with microvascular ischemic disease. Gray-white differentiation is otherwise well maintained without CT evidence of superimposed acute large territory infarct. Known approximately 2.1 x 1 cm meningioma involving the left sphenoid wing is unchanged compared to the 11/2017 examination (coronal image 27, series 5). No intraparenchymal or extra-axial hemorrhage. Unchanged size and configuration of the ventricles and the basilar cisterns. No midline shift. Vascular: Intracranial atherosclerosis. Skull: Old fractures of the left maxillary sinus, orbit and zygomatic arch, improved compared to the 11/2017 examination. No acute displaced calvarial fracture. Sinuses/Orbits: Scattered opacification of the right anterior ethmoidal air cells. Mucosal thickening of the bilateral maxillary  sinuses with small air-fluid level within the left maxillary sinus. Scattered opacification of the left mastoid air cells. Other: Regional soft tissues appear normal. IMPRESSION: 1. Similar findings of atrophy and microvascular ischemic disease without superimposed acute intracranial process. 2. Old fractures of the left maxillary sinus, orbit and zygomatic arch, improved compared to the 11/2017 examination. 3. Sinus disease as above, improved compared to the 11/2017 examination, though there is an air-fluid level within the left maxillary sinus as could be seen in the setting of acute sinusitis. 4. Grossly unchanged  appearance of known approximately 2.1 cm left sphenoid wing calcified meningioma. Electronically Signed   By: Sandi Mariscal M.D.   On: 07/30/2018 13:34   Ct Chest Wo Contrast  Result Date: 08/09/2018 CLINICAL DATA:  SOB. Hx small cell lung cancer, paroxysmal a-fib, HTN, diabetes, CHF, CAD, and CABG. EXAM: CT CHEST WITHOUT CONTRAST TECHNIQUE: Multidetector CT imaging of the chest was performed following the standard protocol without IV contrast. COMPARISON:  Chest CT 07/27/2018 FINDINGS: Cardiovascular: Coronary, aortic arch, and branch vessel atherosclerotic vascular disease. Mild cardiomegaly. Compared to the prior chest CT there has been interval CABG. Atrial appendage clip noted. Mediastinum/Nodes: Expected residual mediastinal edema. Trace residual pneumomediastinum, but nearly completely resolved. Small pericardial effusion, not unexpected in the postoperative setting. Pathologic mediastinal mass/adenopathy. An index AP window lymph node measures 1.7 cm in short axis on image 49/3, stable. The more bulky right paratracheal adenopathy likewise appears relatively stable. Some of this is in the vicinity of the SVC and is at least exerting extrinsic mass effect on the SVC. Lungs/Pleura: There complete obstruction of the right upper lobe bronchus with complete atelectasis of the right upper lobe. Possible bulging tumor into the right mainstem bronchus from the ostium of the right upper lobe bronchus on image 55/4. The bronchus intermedius and its branches appear patent. Moderate right and small left pleural effusions. The right pleural effusion was previously small and the left pleural effusion is new compared to the prior CT. There is passive atelectasis associated with the pleural effusions. Mild bilateral airway thickening. Paraseptal emphysema. Upper Abdomen: High-density liver with borderline appearance for hemochromatosis. Portacaval node abnormally enlarged at 2.2 cm in short axis on image 144/3, stable.  Slight lobularity of the right adrenal gland without a well-defined mass. Musculoskeletal: Lower thoracic spondylosis. No findings of sternal dehiscence. IMPRESSION: 1. Complete atelectasis of the right upper lobe. Probable tumor in the right upper lobe bronchus. Notable mediastinal and likely right hilar adenopathy along with a stable enlarged portacaval lymph node in the upper abdomen which could be malignant. 2. Postoperative findings related to recent CABG including a small residual amount of mediastinal edema and a trace amount of residual pneumomediastinum. 3. There is a moderate right pleural effusion (formerly small on 07/27/2018) and a new small left pleural effusion. These are associated with passive atelectasis. 4. Airway thickening is present, suggesting bronchitis or reactive airways disease. 5. Other imaging findings of potential clinical significance: Aortic Atherosclerosis (ICD10-I70.0). Coronary atherosclerosis with recent CABG. Emphysema (ICD10-J43.9). High-density hepatic parenchyma suspicious for hemochromatosis. Electronically Signed   By: Van Clines M.D.   On: 08/09/2018 02:30   Ct Chest Wo Contrast  Result Date: 07/27/2018 CLINICAL DATA:  Evaluate right paratracheal stripe/superior EXAM: CT CHEST WITHOUT CONTRAST TECHNIQUE: Multidetector CT imaging of the chest was performed following the standard protocol without IV contrast. COMPARISON:  CXR 07/26/2018 FINDINGS: Cardiovascular: Atherosclerosis of the great vessels at their origins with conventional branch pattern. Moderate atherosclerosis of the distal aortic arch  and descending aorta without aneurysm. Left main and three-vessel dense coronary arteriosclerosis. Heart size is normal without pericardial effusion. Mediastinum/Nodes: Mild thyromegaly with retroclavicular extension of the thyroid gland without dominant mass seen. Mediastinal adenopathy along the prevascular, right upper and lower paratracheal portions of the  mediastinum, the largest estimated at 2.3 cm short axis along the right lower paratracheal portion given limitations of a noncontrast study. Mainstem bronchi and trachea are patent. Assessment of hilar adenopathy is somewhat limited due to lack of IV contrast but given new areas of subtle soft tissue prominence, suspect that there is at least right-sided adenopathy since prior exam measuring up to 1.2 cm short axis. Lungs/Pleura: There is a partially necrotic appearing right paramediastinal soft tissue mass in the right upper lobe, the margins somewhat difficult to identify due to lack of IV contrast but is estimated as measuring approximately 3.5 x 3.4 x 3.2 cm, series 3/61 and series 6/40. Additionally, nodular opacities are noted along the major fissure in the posterior segment of right upper lobe, new since prior measuring up to 6 mm. Partially cavitary nodule in the right middle lobe is identified measuring up to 8 mm, series 4/72 with smaller sub 3 mm nodule, series 4/66. Dependent changes are noted along the posterior aspect of the right upper lobe. Small right effusion is present with sub pleural areas of streaky interstitial change likely atelectatic and less likely lymphangitic. Paraseptal and centrilobular emphysema is noted, upper lobe predominant with scarring at the apices. Mild peribronchial thickening and bronchiectasis is noted in the lower lobes. Upper Abdomen: No adrenal mass. No definite space-occupying mass of the included liver. Punctate calcification in the pole of the included left kidney. Musculoskeletal: No chest wall mass or suspicious bone lesions identified. IMPRESSION: 1. Partially necrotic masslike abnormality abutting the superior mediastinum within the medial right upper lobe measuring approximately 3.5 x 3.4 x 3.2 cm. This is associated with smaller nodular opacities in the right middle lobe and abutting the major fissure, the next largest is approximately 8 mm and partially  cavitary in appearance in the right middle lobe. Pulmonary and/or cardiothoracic consultation is suggested. 2. There is associated mediastinal and right hilar lymphadenopathy as above described likely metastatic. 3. Small right pleural effusion. 4. Centrilobular and paraseptal emphysema. 5. Left main and three-vessel coronary arteriosclerosis and aortic atherosclerosis. Aortic Atherosclerosis (ICD10-I70.0) and Emphysema (ICD10-J43.9). Electronically Signed   By: Ashley Royalty M.D.   On: 07/27/2018 23:29   Mr Jeri Cos KD Contrast  Result Date: 08/09/2018 CLINICAL DATA:  Seizure.  Lung cancer. EXAM: MRI HEAD WITHOUT AND WITH CONTRAST TECHNIQUE: Multiplanar, multiecho pulse sequences of the brain and surrounding structures were obtained without and with intravenous contrast. CONTRAST:  8 mL Gadavist COMPARISON:  Head CT 08/09/2018 FINDINGS: The study is degraded by motion, despite efforts to reduce this artifact, including utilization of motion-resistant MR sequences. The findings of the study are interpreted in the context of reduced sensitivity/specificity. BRAIN: There is no acute infarct, acute hemorrhage, hydrocephalus or extra-axial collection. The midline structures are normal. No midline shift or other mass effect. 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 Multifocal white matter hyperintensity, most commonly due to chronic ischemic microangiopathy. Generalized atrophy without lobar predilection. Susceptibility-sensitive sequences show no chronic microhemorrhage or superficial siderosis. No abnormal contrast enhancement. VASCULAR: Major intracranial arterial and venous sinus flow voids are normal. SKULL AND UPPER CERVICAL SPINE: Calvarial bone marrow signal is normal. There is no skull base mass. Visualized upper cervical spine and soft tissues  are normal. SINUSES/ORBITS: There are bilateral maxillary sinus fluid levels and a left mastoid effusion. The orbits are normal. IMPRESSION: 1. No  acute intracranial abnormality. 2. No intracranial metastatic disease. 3. Bilateral maxillary sinus fluid levels and left mastoid effusion. Correlate for symptoms of acute sinusitis. 4. Generalized atrophy and chronic ischemic microangiopathy. Electronically Signed   By: Ulyses Jarred M.D.   On: 08/09/2018 23:14   Dg Chest Port 1 View  Result Date: 08/10/2018 CLINICAL DATA:  Shortness of breath. EXAM: PORTABLE CHEST 1 VIEW COMPARISON:  Radiographs of August 09, 2018. FINDINGS: Stable cardiomediastinal silhouette. Status post coronary artery bypass graft. Stable right upper lobe atelectasis is noted with right hilar prominence concerning for neoplasm. No pneumothorax is noted. Left lung is clear. Right basilar atelectasis or infiltrate is noted with probable associated pleural effusion. Bony thorax is unremarkable. IMPRESSION: Stable right upper lobe atelectasis is noted with right hilar prominence concerning for neoplasm. Stable right basilar atelectasis or infiltrate is noted with associated pleural effusion. Electronically Signed   By: Marijo Conception, M.D.   On: 08/10/2018 11:38   Dg Chest Port 1 View  Result Date: 08/09/2018 CLINICAL DATA:  Right lung cancer. Postoperative for CABG by 1 week. EXAM: PORTABLE CHEST 1 VIEW COMPARISON:  08/08/2018 FINDINGS: There complete atelectasis of the right upper lobe with right hilar prominence favoring tumor. Atrial appendage clip noted.  Prior CABG. Atherosclerotic calcification of the aortic arch. Right basilar atelectasis with some resulting irregularity of the right hemidiaphragm. No overt cardiomegaly.  Thoracic spondylosis. IMPRESSION: 1. Continued complete atelectasis of the right upper lobe. Mildly increased atelectasis along the right hemidiaphragm. 2. Recent CABG without edema. 3. Atrial appendage clip. 4.  Aortic Atherosclerosis (ICD10-I70.0). Electronically Signed   By: Van Clines M.D.   On: 08/09/2018 01:19   Dg Chest Port 1 View  Result  Date: 08/03/2018 CLINICAL DATA:  Follow-up chest tube on the left EXAM: PORTABLE CHEST 1 VIEW COMPARISON:  08/02/2018 FINDINGS: Cardiac shadow is enlarged but stable. Postsurgical changes are again seen. The Swan-Ganz catheter and mediastinal drain have been removed in the interval. Left thoracostomy catheter remains. No definitive pneumothorax is seen. Increasing density is noted in the right upper lobe which may be related to some atelectasis and volume loss. No bony abnormality is seen IMPRESSION: Slight increase in the degree of right upper lobe atelectasis. No pneumothorax is noted on the left. Electronically Signed   By: Inez Catalina M.D.   On: 08/03/2018 08:37   Dg Chest Port 1 View  Result Date: 08/02/2018 CLINICAL DATA:  Chest tube present, follow-up EXAM: PORTABLE CHEST 1 VIEW COMPARISON:  Chest x-ray of 08/01/2017 FINDINGS: The endotracheal tube and NG tube have been removed. A left chest tube remains and no pneumothorax is noted. There is mild basilar volume loss. Right Swan-Ganz catheter is coiled in main pulmonary artery. Cardiomegaly is stable. There may be very mild pulmonary vascular congestion present and small effusions cannot be excluded. Median sternotomy sutures are present IMPRESSION: 1. Endotracheal tube and NG tube removed. 2. Little change in aeration with mild basilar volume loss and possible small effusions. 3. Left chest tube remains with no pneumothorax. 4. Little change in cardiomegaly and possible minimal pulmonary vascular congestion. Electronically Signed   By: Ivar Drape M.D.   On: 08/02/2018 08:15   Dg Chest Port 1 View  Result Date: 08/01/2018 CLINICAL DATA:  Status post CABG EXAM: PORTABLE CHEST 1 VIEW COMPARISON:  07/30/2018 FINDINGS: ET tube tip is above the  carina. Enteric tube tip is below the field of view. There is an IJ catheter with tip looped in the right ventricular outflow tract. Mediastinal drain and left chest tubes identified. No pneumothorax identified.  Mild edema identified within the right base. No airspace densities identified. Right perihilar and right paratracheal lung mass again noted. IMPRESSION: 1. Postoperative changes from CABG procedure. No complications identified. 2. Right perihilar and right paratracheal lung mass as before. Electronically Signed   By: Kerby Moors M.D.   On: 08/01/2018 14:25   Dg Chest Port 1 View  Result Date: 07/30/2018 CLINICAL DATA:  Chest pain EXAM: PORTABLE CHEST 1 VIEW COMPARISON:  07/26/2018 FINDINGS: Mild cardiomegaly. Normal vascularity. Right paratracheal mass is stable. No pneumothorax or pleural effusion. IMPRESSION: Stable right paratracheal mass. Stable cardiomegaly. Electronically Signed   By: Marybelle Killings M.D.   On: 07/30/2018 09:28

## 2018-08-13 NOTE — ED Notes (Signed)
Returned from xray

## 2018-08-13 NOTE — Plan of Care (Signed)
Discussed with Dr. Duffy Wane. [name] is a 77 y/o male with pmh of recent CABG and newly diagnosed small cell lung cancer; who presents with complaints of shortness of breath for the last day.  Patient found to have right hilar mass with postobstructive consolidation and collapse in the right upper lung with small bilateral pleural effusionn on chest x-ray.  BNP was elevated at 756.6, troponin 0.32.  Patient was given 40 mg of Lasix.  Patient requiring 2 L nasal cannula oxygen to maintain O2 sats at this time.  [facility] called to admit.  Accepted to a telemetry bed.

## 2018-08-13 NOTE — Progress Notes (Signed)
Pt's on Lasix with freq. urination.His RLE is currently cramping.Says he normally takes K+ pill home for this & wants some K+ pill now. NP on call was notified. Order for K+ la draw was ordered . Will continue to monitor.

## 2018-08-14 ENCOUNTER — Inpatient Hospital Stay (HOSPITAL_COMMUNITY): Payer: BLUE CROSS/BLUE SHIELD

## 2018-08-14 ENCOUNTER — Encounter (HOSPITAL_COMMUNITY): Payer: Self-pay

## 2018-08-14 DIAGNOSIS — J90 Pleural effusion, not elsewhere classified: Secondary | ICD-10-CM

## 2018-08-14 LAB — CBC
HCT: 27.2 % — ABNORMAL LOW (ref 39.0–52.0)
Hemoglobin: 8.7 g/dL — ABNORMAL LOW (ref 13.0–17.0)
MCH: 28.6 pg (ref 26.0–34.0)
MCHC: 32 g/dL (ref 30.0–36.0)
MCV: 89.5 fL (ref 80.0–100.0)
Platelets: 399 10*3/uL (ref 150–400)
RBC: 3.04 MIL/uL — ABNORMAL LOW (ref 4.22–5.81)
RDW: 13.4 % (ref 11.5–15.5)
WBC: 9.3 10*3/uL (ref 4.0–10.5)
nRBC: 0 % (ref 0.0–0.2)

## 2018-08-14 LAB — BASIC METABOLIC PANEL
Anion gap: 10 (ref 5–15)
BUN: 16 mg/dL (ref 8–23)
CO2: 28 mmol/L (ref 22–32)
Calcium: 8.7 mg/dL — ABNORMAL LOW (ref 8.9–10.3)
Chloride: 90 mmol/L — ABNORMAL LOW (ref 98–111)
Creatinine, Ser: 1.54 mg/dL — ABNORMAL HIGH (ref 0.61–1.24)
GFR calc Af Amer: 48 mL/min — ABNORMAL LOW (ref >60)
GFR calc non Af Amer: 42 mL/min — ABNORMAL LOW (ref >60)
Glucose, Bld: 191 mg/dL — ABNORMAL HIGH (ref 70–99)
Potassium: 5 mmol/L (ref 3.5–5.1)
Sodium: 128 mmol/L — ABNORMAL LOW (ref 135–145)

## 2018-08-14 LAB — LACTATE DEHYDROGENASE, PLEURAL OR PERITONEAL FLUID: LD, Fluid: 140 U/L — ABNORMAL HIGH (ref 3–23)

## 2018-08-14 LAB — BODY FLUID CELL COUNT WITH DIFFERENTIAL
EOS FL: 0 %
Lymphs, Fluid: 12 %
MONOCYTE-MACROPHAGE-SEROUS FLUID: 88 % (ref 50–90)
NEUTROPHIL FLUID: 0 % (ref 0–25)
WBC FLUID: 2617 uL — AB (ref 0–1000)

## 2018-08-14 LAB — GRAM STAIN

## 2018-08-14 LAB — ALBUMIN, PLEURAL OR PERITONEAL FLUID: Albumin, Fluid: 1.6 g/dL

## 2018-08-14 LAB — GLUCOSE, PLEURAL OR PERITONEAL FLUID: GLUCOSE FL: 208 mg/dL

## 2018-08-14 LAB — PROTEIN, PLEURAL OR PERITONEAL FLUID: Total protein, fluid: <3 g/dL

## 2018-08-14 MED ORDER — FUROSEMIDE 10 MG/ML IJ SOLN
40.0000 mg | Freq: Two times a day (BID) | INTRAMUSCULAR | Status: DC
  Administered 2018-08-14 (×2): 40 mg via INTRAVENOUS
  Filled 2018-08-14 (×2): qty 4

## 2018-08-14 MED ORDER — LIDOCAINE HCL (PF) 1 % IJ SOLN
INTRAMUSCULAR | Status: AC
  Filled 2018-08-14: qty 30

## 2018-08-14 MED ORDER — ACETAMINOPHEN 325 MG PO TABS
650.0000 mg | ORAL_TABLET | Freq: Four times a day (QID) | ORAL | Status: DC | PRN
  Administered 2018-08-14 (×3): 650 mg via ORAL
  Filled 2018-08-14 (×3): qty 2

## 2018-08-14 MED ORDER — IPRATROPIUM-ALBUTEROL 0.5-2.5 (3) MG/3ML IN SOLN
3.0000 mL | Freq: Three times a day (TID) | RESPIRATORY_TRACT | Status: DC
  Administered 2018-08-15: 3 mL via RESPIRATORY_TRACT
  Filled 2018-08-14: qty 3

## 2018-08-14 NOTE — Plan of Care (Signed)
Pt is stable throughout the shift, after thoracentesis procedure, tylenol given for a complain of pain around chest and back, he said it is 3/10, moving around the room, in oxygen @2l /min this time, will continue to monitor the patient.  Problem: Activity: Goal: Risk for activity intolerance will decrease Outcome: Progressing   Problem: Nutrition: Goal: Adequate nutrition will be maintained Outcome: Progressing   Problem: Pain Managment: Goal: General experience of comfort will improve Outcome: Progressing   Problem: Safety: Goal: Ability to remain free from injury will improve Outcome: Progressing  Palma Holter, RN

## 2018-08-14 NOTE — Progress Notes (Signed)
Gregory Bates   DOB:10-11-1941   UJ#:[id number]    Assessment & Plan:  Acute on chronic dyspnea -I independently CT chest images and agree with the documented finding; briefly, the right pleural effusion appeared to have enlarged since 08/09/2018 -While SVC could not be evaluated due to lack of contrast (patient has CKD), he denies any headache, vision change, facial/neck swelling or pain, hoarseness of voice or dysphagia, and therefore there is low clinical suspicion for SVC syndrome at this time -His shortness of breath is most likely due to worsening pleural effusion, and less likely postobstructive pneumonia -As the patient has been seen by Dr. Servando Snare in clinic, recommend consulting thoracic surgery for possible thoracentesis; alternatively, IR guided thoracentesis is also a reasonable option -Given the low suspicion for SVC syndrome, okay to discontinue steroid for now  Leukocytosis -WBC mildly elevated on admission, normalized today -CT chest showed RUL endobronchial compression/obstruction, possibly slightly increased since 08/09/2018 -On empiric cefepime -Defer the abx management to the hospitalist team   Extensive stage small cell carcinoma -Patient is scheduled for PET and CT simulation on 08/18/2018 -If the patient's dyspnea does not improve with thoracentesis, we can consider consulting radiation oncology for possible palliative RT to the mass adjacent to the SVC  Tish Men, MD 08/14/2018  8:59 AM   Subjective:  Mr. Climer reports that his dyspnea has improved since admission.  He denies any fever, chill, headache, vision change, neck swelling/pain, chest pain, cough, or hemoptysis. He has mild swelling in the arms, but is improving with Lasix. He denies any other complaint.   ROS: Constitutional: ( - ) fevers, ( - )  chills , ( - ) night sweats Ears, nose, mouth, throat, and face: ( - ) mucositis, ( - ) sore throat Respiratory: ( - ) cough, ( + ) improving dyspnea, ( - )  wheezes Cardiovascular: ( - ) palpitation, ( - ) chest discomfort, ( - ) lower extremity swelling Gastrointestinal:  ( - ) nausea, ( - ) heartburn, ( - ) change in bowel habits Skin: ( - ) abnormal skin rashes Behavioral/Psych: ( - ) mood change, ( - ) new changes  All other systems were reviewed with the patient and are negative.  Objective:  Vitals:   08/14/18 0204 08/14/18 0446  BP:  (!) 155/50  Pulse:  70  Resp:  18  Temp:  98.3 F (36.8 C)  SpO2: 97% 95%     Intake/Output Summary (Last 24 hours) at 08/14/2018 0859 Last data filed at 08/14/2018 0140 Gross per 24 hour  Intake 720 ml  Output 3101 ml  Net -2381 ml    GENERAL: alert, no distress and comfortable SKIN: skin color, texture, turgor are normal, no rashes or significant lesions EYES: conjunctiva are pink and non-injected, sclera clear OROPHARYNX: no exudate, no erythema; lips, buccal mucosa, and tongue normal  NECK: supple, non-tender LUNGS: decreased breath sound in the right lung base, dullness to percussion up to 1/3 from the right lung base, left lung relatively clear to auscultation, no crackles, rales or wheezing  HEART: regular rate & rhythm and 2/6 systolic murmur, no lower extremity edema ABDOMEN: soft, non-tender, non-distended, normal bowel sounds Musculoskeletal: no cyanosis of digits and no clubbing  PSYCH: alert & oriented x 3, fluent speech   Labs:  Lab Results  Component Value Date   WBC 9.3 08/14/2018   HGB 8.7 (L) 08/14/2018   HCT 27.2 (L) 08/14/2018   MCV 89.5 08/14/2018   PLT 399 08/14/2018  NEUTROABS 10.4 (H) 08/13/2018    Lab Results  Component Value Date   NA 128 (L) 08/14/2018   K 5.0 08/14/2018   CL 90 (L) 08/14/2018   CO2 28 08/14/2018    Studies:  Dg Chest 2 View  Result Date: 08/13/2018 CLINICAL DATA:  Bypass surgery 2 weeks ago. Now shortness of breath. Diagnosed with right upper lobe lung cancer. EXAM: CHEST - 2 VIEW COMPARISON:  08/10/2018 FINDINGS: Right hilar  mass with postobstructive consolidation and collapse in the right upper lung. Small bilateral pleural effusions with basilar atelectasis. Postoperative changes in the mediastinum. Mild cardiac enlargement. No vascular congestion or edema. IMPRESSION: Right hilar mass with postobstructive consolidation and collapse in the right upper lung. Small bilateral pleural effusions with basilar atelectasis. Electronically Signed   By: Lucienne Capers M.D.   On: 08/13/2018 01:57   Ct Chest Wo Contrast  Result Date: 08/13/2018 CLINICAL DATA:  Extensive small cell carcinoma of lung. Worsened dyspnea. Evaluate for worsening pleural effusion and SVC compression. End-stage kidney disease. EXAM: CT CHEST WITHOUT CONTRAST TECHNIQUE: Multidetector CT imaging of the chest was performed following the standard protocol without IV contrast. COMPARISON:  08/13/2018 chest radiograph.  CT 08/09/2018. FINDINGS: Cardiovascular: Mild motion degradation throughout. Aortic and branch vessel atherosclerosis. Median sternotomy for CABG. Mild cardiomegaly, without pericardial effusion. Native coronary artery atherosclerosis. Decrease in retrosternal edema, without postoperative fluid collection. Mediastinum/Nodes: Right paramediastinal soft tissue density which likely represents a combination of adenopathy and surrounding collapse. Estimated on the order of 3.6 x 3.9 cm on image 54/3. Subcarinal node of 1.7 cm on image 78/3. Hilar regions not well evaluated. IVC not delineated. Prevascular adenopathy again identified at 1.4 cm on image 58/3. Lungs/Pleura: Enlargement of a right pleural effusion with increase in small left pleural effusion. No loculation identified. No pneumothorax. Right upper lobe endobronchial compression or obstruction, similar to slightly increased compared to 08/09/2018. Again identified is right upper lobe collapse, presumably secondary to central mass and adenopathy. Moderate centrilobular emphysema. Progressive left  lower lobe dependent atelectasis. Upper Abdomen: Hyperattenuation within the liver. Normal imaged portions of the spleen, stomach, pancreas, gallbladder, kidneys, left adrenal gland. Mild right adrenal nodularity. Upper abdominal adenopathy again identified, with a portacaval node measuring 1.9 cm. Musculoskeletal: Prior median sternotomy. Moderate thoracic spondylosis. IMPRESSION: 1. Mild motion degradation. 2. Since 4 days ago, increase in moderate right and small left pleural effusions with progressive left lower lobe atelectasis. 3. Extensive thoracic adenopathy with right upper lobe collapse, likely secondary to adenopathy and possible central right upper lobe tumor. 4. SVC cannot be evaluated secondary to lack of IV contrast. The presumed adenopathy and tumor are centered along the expected course of the SVC. 5. Upper abdominal adenopathy, suspicious for metastatic disease. 6. Hyperattenuation within the liver which is nonspecific. 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 7. Aortic atherosclerosis (ICD10-I70.0) and emphysema (ICD10-J43.9). Electronically Signed   By: Abigail Miyamoto M.D.   On: 08/13/2018 17:22

## 2018-08-14 NOTE — Progress Notes (Signed)
[facility]                                                                              Patient Demographics  Gregory Bates, is a 77 y.o. male, DOB - [birth year], VVO:[id number]  Admit date - 08/13/2018   Admitting Physician Norval Morton, MD  Outpatient Primary MD for the patient is Street, Sharon Mt, MD  Outpatient specialists:   LOS - 1  days   Medical records reviewed and are as summarized below:    Chief Complaint  Patient presents with  . Respiratory Distress       Brief summary   Patient is a 77 year old male with paroxysmal atrial fibrillation on anticoagulation Eliquis, DM, ischemic cardiomyopathy, chronically elevated troponin, grade 2 diastolic CHF, 26-RSWN-IOEV smoking history, quit 30 years ago, CKD stage III, hypertension, CAD, recent CABG on 08/01/2018, small cell lung CA, followed by Dr. Julien Nordmann.  Patient reported right squamous cell CA was diagnosed during recent CABG surgery, postop period complicated with diarrhea, shortness of breath, seizure-like activity, occlusion of the bronchus with resultant collapse of the right upper lobe of the lung.  Patient was seen by oncology, outpatient thoracentesis was planned on 10/14.  Patient presented to ED with sudden onset of shortness of breath and night before the admission, no fevers or chills.  Patient also reported bilateral upper extremity swelling worse on the right.  Patient was admitted for further work-up.    Assessment & Plan   Principal problem  Acute respiratory failure with hypoxia : Multifactorial secondary to extensive primary small cell CA lung, bilateral pleural effusion Rt>Lt, collapse of the right upper lobe -Patient was also placed on diuresis in ED, feeling slightly better this morning -Ultrasound-guided thoracentesis was scheduled outpatient on 10/14, Eliquis has been held since 10/11 a.m.  -Chest x-ray showed right hilar mass with obstructive consolidation and collapse in the  right upper lung, bilateral pleural effusions right slightly larger than left.  Repeat CT chest on 10/12 showed enlarging right pleural effusion, SVC could not be evaluated secondary to lack of IV contrast.  Upper abdominal adenopathy suspicious for metastatic disease - patient was seen by oncology, Dr Maylon Peppers, discussed in detail, low suspicion for SVC syndrome.  Recommended thoracentesis, no need of IV steroids, Decadron discontinued -Discussed with Dr. Servando Snare, recommended ultrasound-guided thoracentesis with IR   Bilateral pleural effusion with possible acute on chronic diastolic CHF -Recent 2D echo 9/26 showed EF of 55 to 60% with grade 2 diastolic dysfunction -For now continue IV Lasix, decrease dose to 40 mg every 12 hours. 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 Julien Nordmann, seen by oncology inpatient -He is scheduled for PET/CT on 10/17 and CT simulation for palliative XRT on 10/17 - d/w Dr Maylon Peppers, patient's shortness of breath improves after thoracentesis and diuresis, he may be discharged and continue with outpatient plan of PET/CT and radiation.  However if it does not, will consider rad-onc for palliative XRT to the mass adjacent to SVC.  Patient has been  seen by Dr. Lisbeth Renshaw on 08/11/2018   Possible postobstructive pneumonia -Patient had leukocytosis on the time of admission, currently resolved -For now on empiric cefepime, will reassess in next 24 hours if patient needs any antibiotics.  Paroxysmal atrial fibrillation Currently rate controlled, follows Dr. Angelena Form, has been on Eliquis, currently in NSR Postoperatively, patient had atrial fibrillation with RVR Currently anticoagulation on hold for thoracentesis  CKD stage III -Baseline 1.3-1.5 -Creatinine currently 1.5, improved from 1.6 yesterday, monitor closely with Lasix  diuresis  Chronic hyponatremia Likely due to underlying SIADH from lung mass, worsened due to diuresis Baseline sodium 127-130 Monitor closely with diuresis  Anemia, normocytic, possibly due to malignancy -Baseline between 8-9 -Hemoglobin currently 8.7, follow closely  Coronary artery disease status post recent CABG: -CT surgery consulted, following appreciate recommendations  Code Status: Full code DVT Prophylaxis: Eliquis on hold Family Communication: Discussed in detail with the patient, all imaging results, lab results explained to the patient and wife   Disposition Plan: Needs thoracentesis and further plan of management based on clinical improvement  Time Spent in minutes 40 minutes  Procedures:  CT chest  Consultants:   Heme oncology CT surgery  Antimicrobials:   IV cefepime 10/12   Medications  Scheduled Meds: . amiodarone  200 mg Oral BID   Followed by  . [START ON 08/16/2018] amiodarone  200 mg Oral Daily  . aspirin EC  81 mg Oral Daily  . atorvastatin  20 mg Oral Daily  . cholecalciferol  1,000 Units Oral QPM  . fluticasone  1 spray Each Nare BID  . furosemide  40 mg Intravenous BID  . ipratropium-albuterol  3 mL Nebulization Once  . ipratropium-albuterol  3 mL Nebulization Q6H  . loratadine  10 mg Oral Daily  . metoprolol tartrate  25 mg Oral BID  . multivitamin with minerals  1 tablet Oral QHS  . vitamin E  100 Units Oral QHS   Continuous Infusions: . ceFEPime (MAXIPIME) IV 1 g (08/13/18 1354)   PRN Meds:.acetaminophen, guaiFENesin, ipratropium-albuterol   Antibiotics   Anti-infectives (From admission, onward)   Start     Dose/Rate Route Frequency Ordered Stop   08/13/18 1200  ceFEPIme (MAXIPIME) 1 g in sodium chloride 0.9 % 100 mL IVPB     1 g 200 mL/hr over 30 Minutes Intravenous Every 24 hours 08/13/18 1104          Subjective:   [name] was seen and examined today.  Sitting up in the chair, no fevers, shortness of breath  and upper extremity swelling improving today. Patient denies dizziness, chest pain,  abdominal pain, N/V/D/C, new weakness.  Objective:   Vitals:   08/13/18 2153 08/14/18 0056 08/14/18 0204 08/14/18 0446  BP: (!) 100/46 (!) 105/49  (!) 155/50  Pulse: 77 66  70  Resp:  18  18  Temp:  98.3 F (36.8 C)  98.3 F (36.8 C)  TempSrc:  Oral  Oral  SpO2:  99% 97% 95%  Weight:    86.2 kg  Height:        Intake/Output Summary (Last 24 hours) at 08/14/2018 1014 Last data filed at 08/14/2018 0140 Gross per 24 hour  Intake 480 ml  Output 2600 ml  Net -2120 ml     Wt Readings from Last 3 Encounters:  08/14/18 86.2 kg  08/11/18 89.6 kg  08/09/18 81.1 kg     Exam  General: Alert and oriented x 3, NAD  Eyes:   HEENT:  Atraumatic, normocephalic  Cardiovascular: S1 S2 auscultated, Regular rate and rhythm.  Respiratory: Decreased breath sound at the bases Rt>Lt  Gastrointestinal: Soft, nontender, nondistended, + bowel sounds  Ext: no pedal edema bilaterally  Neuro: no new deficit  Musculoskeletal: No digital cyanosis, clubbing  Skin: No rashes  Psych: Normal affect and demeanor, alert and oriented x3    Data Reviewed:  I have personally reviewed following labs and imaging studies  Micro Results Recent Results (from the past 240 hour(s))  MRSA PCR Screening     Status: None   Collection Time: 08/13/18  2:26 PM  Result Value Ref Range Status   MRSA by PCR NEGATIVE NEGATIVE Final    Comment:        The GeneXpert MRSA Assay (FDA approved for NASAL specimens only), is one component of a comprehensive MRSA colonization surveillance program. It is not intended to diagnose MRSA infection nor to guide or monitor treatment for MRSA infections. Performed at [facility], [place] [address] [place], [place] [address]     Radiology Reports Dg Chest 2 View  Result Date: 08/13/2018 CLINICAL DATA:  Bypass surgery 2 weeks ago. Now shortness of breath. Diagnosed with  right upper lobe lung cancer. EXAM: CHEST - 2 VIEW COMPARISON:  08/10/2018 FINDINGS: Right hilar mass with postobstructive consolidation and collapse in the right upper lung. Small bilateral pleural effusions with basilar atelectasis. Postoperative changes in the mediastinum. Mild cardiac enlargement. No vascular congestion or edema. IMPRESSION: Right hilar mass with postobstructive consolidation and collapse in the right upper lung. Small bilateral pleural effusions with basilar atelectasis. Electronically Signed   By: Lucienne Capers M.D.   On: 08/13/2018 01:57   Dg Chest 2 View  Result Date: 08/05/2018 CLINICAL DATA:  CABG. EXAM: CHEST - 2 VIEW COMPARISON:  08/03/2018. FINDINGS: Interim removal of right IJ sheath. Prior CABG. Left atrial appendage clip noted in stable position. Stable cardiomegaly. Right upper lobe atelectatic changes, progressed from prior exam. Small bilateral pleural effusions, progressed from prior exam. No pneumothorax. IMPRESSION: 1.  Interim removal of right IJ sheath.  No pneumothorax. 2. Prior CABG. Left atrial appendage clip in stable position. Stable cardiomegaly. 3. Progressive right upper lobe atelectasis. Slight progression of bilateral small pleural effusions. Electronically Signed   By: Marcello Moores  Register   On: 08/05/2018 08:23   Dg Chest 2 View  Result Date: 07/26/2018 CLINICAL DATA:  Chest pain EXAM: CHEST - 2 VIEW COMPARISON:  07/24/2018 chest radiograph. FINDINGS: Normal heart size. Prominence of the right paratracheal stripe/right superior hilar contour. Aortic atherosclerosis. No pneumothorax. No pleural effusion. Hyperinflated lungs. IMPRESSION: Prominence of the right paratracheal stripe/right suprahilar contour. Recommend chest CT with IV contrast to exclude adenopathy/lung mass. Hyperinflated lungs, suggesting COPD. Electronically Signed   By: Ilona Sorrel M.D.   On: 07/26/2018 01:14   Dg Chest 2 View  Result Date: 07/24/2018 CLINICAL DATA:  Pt reports cough x  yesterday and left sided chest pain for over 1 hour; Hx of MI w/ 5 stents, DM, and HTN; former smoker EXAM: CHEST - 2 VIEW COMPARISON:  07/19/2018 FINDINGS: Cardiac silhouette is normal in size and configuration no mediastinal or hilar masses. No evidence of adenopathy. Clear lungs.  No pleural effusion or pneumothorax. Skeletal structures are demineralized but intact. IMPRESSION: No active cardiopulmonary disease. Electronically Signed   By: Lajean Manes M.D.   On: 07/24/2018 12:17   Ct Head Wo Contrast  Result Date: 08/09/2018 CLINICAL DATA:  Altered mental status EXAM: CT HEAD WITHOUT CONTRAST TECHNIQUE: Contiguous  axial images were obtained from the base of the skull through the vertex without intravenous contrast. COMPARISON:  Head CT 07/31/2009 FINDINGS: Brain: There is no hemorrhage or extra-axial collection. Unchanged appearance of calcified meningioma at the left sphenoid wing. The size and configuration of the ventricles and extra-axial CSF spaces are normal. There is no acute or chronic infarction. There is hypoattenuation of the periventricular white matter, most commonly indicating chronic ischemic microangiopathy. Vascular: Atherosclerotic calcification of the internal carotid arteries at the skull base. No abnormal hyperdensity of the major intracranial arteries or dural venous sinuses. Skull: The visualized skull base, calvarium and extracranial soft tissues are normal. Sinuses/Orbits: Left-greater-than-right maxillary sinus opacification with fluid levels. There is also a left mastoid effusion. The orbits are normal. IMPRESSION: 1. No acute intracranial abnormality. 2. Unchanged appearance of calcified left sphenoid wing meningioma. 3. Maxillary sinus opacification with fluid levels. Correlate for symptoms of acute sinusitis. Electronically Signed   By: Ulyses Jarred M.D.   On: 08/09/2018 02:19   Ct Head Wo Contrast  Result Date: 07/30/2018 CLINICAL DATA:  Post heart catheterization now  with subacute neurologic defects. EXAM: CT HEAD WITHOUT CONTRAST TECHNIQUE: Contiguous axial images were obtained from the base of the skull through the vertex without intravenous contrast. COMPARISON:  11/02/2017 FINDINGS: Brain: Similar findings of atrophy with centralized volume loss and mild commensurate ex vacuo dilatation the ventricular system. Re demonstrated scattered periventricular hypodensities, most conspicuous about the left centrum semiovale compatible with microvascular ischemic disease. Gray-white differentiation is otherwise well maintained without CT evidence of superimposed acute large territory infarct. Known approximately 2.1 x 1 cm meningioma involving the left sphenoid wing is unchanged compared to the 11/2017 examination (coronal image 27, series 5). No intraparenchymal or extra-axial hemorrhage. Unchanged size and configuration of the ventricles and the basilar cisterns. No midline shift. Vascular: Intracranial atherosclerosis. Skull: Old fractures of the left maxillary sinus, orbit and zygomatic arch, improved compared to the 11/2017 examination. No acute displaced calvarial fracture. Sinuses/Orbits: Scattered opacification of the right anterior ethmoidal air cells. Mucosal thickening of the bilateral maxillary sinuses with small air-fluid level within the left maxillary sinus. Scattered opacification of the left mastoid air cells. Other: Regional soft tissues appear normal. IMPRESSION: 1. Similar findings of atrophy and microvascular ischemic disease without superimposed acute intracranial process. 2. Old fractures of the left maxillary sinus, orbit and zygomatic arch, improved compared to the 11/2017 examination. 3. Sinus disease as above, improved compared to the 11/2017 examination, though there is an air-fluid level within the left maxillary sinus as could be seen in the setting of acute sinusitis. 4. Grossly unchanged appearance of known approximately 2.1 cm left sphenoid wing  calcified meningioma. Electronically Signed   By: Sandi Mariscal M.D.   On: 07/30/2018 13:34   Ct Chest Wo Contrast  Result Date: 08/13/2018 CLINICAL DATA:  Extensive small cell carcinoma of lung. Worsened dyspnea. Evaluate for worsening pleural effusion and SVC compression. End-stage kidney disease. EXAM: CT CHEST WITHOUT CONTRAST TECHNIQUE: Multidetector CT imaging of the chest was performed following the standard protocol without IV contrast. COMPARISON:  08/13/2018 chest radiograph.  CT 08/09/2018. FINDINGS: Cardiovascular: Mild motion degradation throughout. Aortic and branch vessel atherosclerosis. Median sternotomy for CABG. Mild cardiomegaly, without pericardial effusion. Native coronary artery atherosclerosis. Decrease in retrosternal edema, without postoperative fluid collection. Mediastinum/Nodes: Right paramediastinal soft tissue density which likely represents a combination of adenopathy and surrounding collapse. Estimated on the order of 3.6 x 3.9 cm on image 54/3. Subcarinal node of 1.7 cm on  image 78/3. Hilar regions not well evaluated. IVC not delineated. Prevascular adenopathy again identified at 1.4 cm on image 58/3. Lungs/Pleura: Enlargement of a right pleural effusion with increase in small left pleural effusion. No loculation identified. No pneumothorax. Right upper lobe endobronchial compression or obstruction, similar to slightly increased compared to 08/09/2018. Again identified is right upper lobe collapse, presumably secondary to central mass and adenopathy. Moderate centrilobular emphysema. Progressive left lower lobe dependent atelectasis. Upper Abdomen: Hyperattenuation within the liver. Normal imaged portions of the spleen, stomach, pancreas, gallbladder, kidneys, left adrenal gland. Mild right adrenal nodularity. Upper abdominal adenopathy again identified, with a portacaval node measuring 1.9 cm. Musculoskeletal: Prior median sternotomy. Moderate thoracic spondylosis. IMPRESSION:  1. Mild motion degradation. 2. Since 4 days ago, increase in moderate right and small left pleural effusions with progressive left lower lobe atelectasis. 3. Extensive thoracic adenopathy with right upper lobe collapse, likely secondary to adenopathy and possible central right upper lobe tumor. 4. SVC cannot be evaluated secondary to lack of IV contrast. The presumed adenopathy and tumor are centered along the expected course of the SVC. 5. Upper abdominal adenopathy, suspicious for metastatic disease. 6. Hyperattenuation within the liver which is nonspecific. 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 7. Aortic atherosclerosis (ICD10-I70.0) and emphysema (ICD10-J43.9). Electronically Signed   By: Abigail Miyamoto M.D.   On: 08/13/2018 17:22   Ct Chest Wo Contrast  Result Date: 08/09/2018 CLINICAL DATA:  SOB. Hx small cell lung cancer, paroxysmal a-fib, HTN, diabetes, CHF, CAD, and CABG. EXAM: CT CHEST WITHOUT CONTRAST TECHNIQUE: Multidetector CT imaging of the chest was performed following the standard protocol without IV contrast. COMPARISON:  Chest CT 07/27/2018 FINDINGS: Cardiovascular: Coronary, aortic arch, and branch vessel atherosclerotic vascular disease. Mild cardiomegaly. Compared to the prior chest CT there has been interval CABG. Atrial appendage clip noted. Mediastinum/Nodes: Expected residual mediastinal edema. Trace residual pneumomediastinum, but nearly completely resolved. Small pericardial effusion, not unexpected in the postoperative setting. Pathologic mediastinal mass/adenopathy. An index AP window lymph node measures 1.7 cm in short axis on image 49/3, stable. The more bulky right paratracheal adenopathy likewise appears relatively stable. Some of this is in the vicinity of the SVC and is at least exerting extrinsic mass effect on the SVC. Lungs/Pleura: There complete obstruction of the right upper lobe bronchus with complete atelectasis of the right upper lobe.  Possible bulging tumor into the right mainstem bronchus from the ostium of the right upper lobe bronchus on image 55/4. The bronchus intermedius and its branches appear patent. Moderate right and small left pleural effusions. The right pleural effusion was previously small and the left pleural effusion is new compared to the prior CT. There is passive atelectasis associated with the pleural effusions. Mild bilateral airway thickening. Paraseptal emphysema. Upper Abdomen: High-density liver with borderline appearance for hemochromatosis. Portacaval node abnormally enlarged at 2.2 cm in short axis on image 144/3, stable. Slight lobularity of the right adrenal gland without a well-defined mass. Musculoskeletal: Lower thoracic spondylosis. No findings of sternal dehiscence. IMPRESSION: 1. Complete atelectasis of the right upper lobe. Probable tumor in the right upper lobe bronchus. Notable mediastinal and likely right hilar adenopathy along with a stable enlarged portacaval lymph node in the upper abdomen which could be malignant. 2. Postoperative findings related to recent CABG including a small residual amount of mediastinal edema and a trace amount of residual pneumomediastinum. 3. There is a moderate right pleural effusion (formerly small on 07/27/2018) and a new small left pleural  effusion. These are associated with passive atelectasis. 4. Airway thickening is present, suggesting bronchitis or reactive airways disease. 5. Other imaging findings of potential clinical significance: Aortic Atherosclerosis (ICD10-I70.0). Coronary atherosclerosis with recent CABG. Emphysema (ICD10-J43.9). High-density hepatic parenchyma suspicious for hemochromatosis. Electronically Signed   By: Van Clines M.D.   On: 08/09/2018 02:30   Ct Chest Wo Contrast  Result Date: 07/27/2018 CLINICAL DATA:  Evaluate right paratracheal stripe/superior EXAM: CT CHEST WITHOUT CONTRAST TECHNIQUE: Multidetector CT imaging of the chest was  performed following the standard protocol without IV contrast. COMPARISON:  CXR 07/26/2018 FINDINGS: Cardiovascular: Atherosclerosis of the great vessels at their origins with conventional branch pattern. Moderate atherosclerosis of the distal aortic arch and descending aorta without aneurysm. Left main and three-vessel dense coronary arteriosclerosis. Heart size is normal without pericardial effusion. Mediastinum/Nodes: Mild thyromegaly with retroclavicular extension of the thyroid gland without dominant mass seen. Mediastinal adenopathy along the prevascular, right upper and lower paratracheal portions of the mediastinum, the largest estimated at 2.3 cm short axis along the right lower paratracheal portion given limitations of a noncontrast study. Mainstem bronchi and trachea are patent. Assessment of hilar adenopathy is somewhat limited due to lack of IV contrast but given new areas of subtle soft tissue prominence, suspect that there is at least right-sided adenopathy since prior exam measuring up to 1.2 cm short axis. Lungs/Pleura: There is a partially necrotic appearing right paramediastinal soft tissue mass in the right upper lobe, the margins somewhat difficult to identify due to lack of IV contrast but is estimated as measuring approximately 3.5 x 3.4 x 3.2 cm, series 3/61 and series 6/40. Additionally, nodular opacities are noted along the major fissure in the posterior segment of right upper lobe, new since prior measuring up to 6 mm. Partially cavitary nodule in the right middle lobe is identified measuring up to 8 mm, series 4/72 with smaller sub 3 mm nodule, series 4/66. Dependent changes are noted along the posterior aspect of the right upper lobe. Small right effusion is present with sub pleural areas of streaky interstitial change likely atelectatic and less likely lymphangitic. Paraseptal and centrilobular emphysema is noted, upper lobe predominant with scarring at the apices. Mild peribronchial  thickening and bronchiectasis is noted in the lower lobes. Upper Abdomen: No adrenal mass. No definite space-occupying mass of the included liver. Punctate calcification in the pole of the included left kidney. Musculoskeletal: No chest wall mass or suspicious bone lesions identified. IMPRESSION: 1. Partially necrotic masslike abnormality abutting the superior mediastinum within the medial right upper lobe measuring approximately 3.5 x 3.4 x 3.2 cm. This is associated with smaller nodular opacities in the right middle lobe and abutting the major fissure, the next largest is approximately 8 mm and partially cavitary in appearance in the right middle lobe. Pulmonary and/or cardiothoracic consultation is suggested. 2. There is associated mediastinal and right hilar lymphadenopathy as above described likely metastatic. 3. Small right pleural effusion. 4. Centrilobular and paraseptal emphysema. 5. Left main and three-vessel coronary arteriosclerosis and aortic atherosclerosis. Aortic Atherosclerosis (ICD10-I70.0) and Emphysema (ICD10-J43.9). Electronically Signed   By: Ashley Royalty M.D.   On: 07/27/2018 23:29   Mr Jeri Cos FG Contrast  Result Date: 08/09/2018 CLINICAL DATA:  Seizure.  Lung cancer. EXAM: MRI HEAD WITHOUT AND WITH CONTRAST TECHNIQUE: Multiplanar, multiecho pulse sequences of the brain and surrounding structures were obtained without and with intravenous contrast. CONTRAST:  8 mL Gadavist COMPARISON:  Head CT 08/09/2018 FINDINGS: The study is degraded by motion, despite efforts  to reduce this artifact, including utilization of motion-resistant MR sequences. The findings of the study are interpreted in the context of reduced sensitivity/specificity. BRAIN: There is no acute infarct, acute hemorrhage, hydrocephalus or extra-axial collection. The midline structures are normal. No midline shift or other mass effect. 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 Multifocal white matter  hyperintensity, most commonly due to chronic ischemic microangiopathy. Generalized atrophy without lobar predilection. Susceptibility-sensitive sequences show no chronic microhemorrhage or superficial siderosis. No abnormal contrast enhancement. VASCULAR: Major intracranial arterial and venous sinus flow voids are normal. SKULL AND UPPER CERVICAL SPINE: Calvarial bone marrow signal is normal. There is no skull base mass. Visualized upper cervical spine and soft tissues are normal. SINUSES/ORBITS: There are bilateral maxillary sinus fluid levels and a left mastoid effusion. The orbits are normal. IMPRESSION: 1. No acute intracranial abnormality. 2. No intracranial metastatic disease. 3. Bilateral maxillary sinus fluid levels and left mastoid effusion. Correlate for symptoms of acute sinusitis. 4. Generalized atrophy and chronic ischemic microangiopathy. Electronically Signed   By: Ulyses Jarred M.D.   On: 08/09/2018 23:14   Dg Chest Port 1 View  Result Date: 08/10/2018 CLINICAL DATA:  Shortness of breath. EXAM: PORTABLE CHEST 1 VIEW COMPARISON:  Radiographs of August 09, 2018. FINDINGS: Stable cardiomediastinal silhouette. Status post coronary artery bypass graft. Stable right upper lobe atelectasis is noted with right hilar prominence concerning for neoplasm. No pneumothorax is noted. Left lung is clear. Right basilar atelectasis or infiltrate is noted with probable associated pleural effusion. Bony thorax is unremarkable. IMPRESSION: Stable right upper lobe atelectasis is noted with right hilar prominence concerning for neoplasm. Stable right basilar atelectasis or infiltrate is noted with associated pleural effusion. Electronically Signed   By: Marijo Conception, M.D.   On: 08/10/2018 11:38   Dg Chest Port 1 View  Result Date: 08/09/2018 CLINICAL DATA:  Right lung cancer. Postoperative for CABG by 1 week. EXAM: PORTABLE CHEST 1 VIEW COMPARISON:  08/08/2018 FINDINGS: There complete atelectasis of the right  upper lobe with right hilar prominence favoring tumor. Atrial appendage clip noted.  Prior CABG. Atherosclerotic calcification of the aortic arch. Right basilar atelectasis with some resulting irregularity of the right hemidiaphragm. No overt cardiomegaly.  Thoracic spondylosis. IMPRESSION: 1. Continued complete atelectasis of the right upper lobe. Mildly increased atelectasis along the right hemidiaphragm. 2. Recent CABG without edema. 3. Atrial appendage clip. 4.  Aortic Atherosclerosis (ICD10-I70.0). Electronically Signed   By: Van Clines M.D.   On: 08/09/2018 01:19   Dg Chest Port 1 View  Result Date: 08/03/2018 CLINICAL DATA:  Follow-up chest tube on the left EXAM: PORTABLE CHEST 1 VIEW COMPARISON:  08/02/2018 FINDINGS: Cardiac shadow is enlarged but stable. Postsurgical changes are again seen. The Swan-Ganz catheter and mediastinal drain have been removed in the interval. Left thoracostomy catheter remains. No definitive pneumothorax is seen. Increasing density is noted in the right upper lobe which may be related to some atelectasis and volume loss. No bony abnormality is seen IMPRESSION: Slight increase in the degree of right upper lobe atelectasis. No pneumothorax is noted on the left. Electronically Signed   By: Inez Catalina M.D.   On: 08/03/2018 08:37   Dg Chest Port 1 View  Result Date: 08/02/2018 CLINICAL DATA:  Chest tube present, follow-up EXAM: PORTABLE CHEST 1 VIEW COMPARISON:  Chest x-ray of 08/01/2017 FINDINGS: The endotracheal tube and NG tube have been removed. A left chest tube remains and no pneumothorax is noted. There is mild basilar volume loss.  Right Swan-Ganz catheter is coiled in main pulmonary artery. Cardiomegaly is stable. There may be very mild pulmonary vascular congestion present and small effusions cannot be excluded. Median sternotomy sutures are present IMPRESSION: 1. Endotracheal tube and NG tube removed. 2. Little change in aeration with mild basilar volume  loss and possible small effusions. 3. Left chest tube remains with no pneumothorax. 4. Little change in cardiomegaly and possible minimal pulmonary vascular congestion. Electronically Signed   By: Ivar Drape M.D.   On: 08/02/2018 08:15   Dg Chest Port 1 View  Result Date: 08/01/2018 CLINICAL DATA:  Status post CABG EXAM: PORTABLE CHEST 1 VIEW COMPARISON:  07/30/2018 FINDINGS: ET tube tip is above the carina. Enteric tube tip is below the field of view. There is an IJ catheter with tip looped in the right ventricular outflow tract. Mediastinal drain and left chest tubes identified. No pneumothorax identified. Mild edema identified within the right base. No airspace densities identified. Right perihilar and right paratracheal lung mass again noted. IMPRESSION: 1. Postoperative changes from CABG procedure. No complications identified. 2. Right perihilar and right paratracheal lung mass as before. Electronically Signed   By: Kerby Moors M.D.   On: 08/01/2018 14:25   Dg Chest Port 1 View  Result Date: 07/30/2018 CLINICAL DATA:  Chest pain EXAM: PORTABLE CHEST 1 VIEW COMPARISON:  07/26/2018 FINDINGS: Mild cardiomegaly. Normal vascularity. Right paratracheal mass is stable. No pneumothorax or pleural effusion. IMPRESSION: Stable right paratracheal mass. Stable cardiomegaly. Electronically Signed   By: Marybelle Killings M.D.   On: 07/30/2018 09:28    Lab Data:  CBC: Recent Labs  Lab 08/09/18 0255 08/13/18 0155 08/14/18 0425  WBC 9.3 13.0* 9.3  NEUTROABS 7.2 10.4*  --   HGB 8.5* 9.0* 8.7*  HCT 27.1* 30.1* 27.2*  MCV 91.2 93.5 89.5  PLT 367 471* 086   Basic Metabolic Panel: Recent Labs  Lab 08/09/18 0255 08/09/18 0531 08/13/18 0155 08/13/18 1132 08/13/18 2224 08/14/18 0425  NA 133*  --  131*  --   --  128*  K 4.0  --  4.5  --  4.6 5.0  CL 98  --  96*  --   --  90*  CO2 25  --  25  --   --  28  GLUCOSE 131*  --  105*  --   --  191*  BUN 23  --  15  --   --  16  CREATININE 1.87*  --   1.65*  --   --  1.54*  CALCIUM 8.0*  --  8.6*  --   --  8.7*  MG  --  1.9 1.9 1.9  --   --   PHOS  --   --   --  3.6  --   --    GFR: Estimated Creatinine Clearance: 42.8 mL/min (A) (by C-G formula based on SCr of 1.54 mg/dL (H)). Liver Function Tests: Recent Labs  Lab 08/09/18 0255 08/13/18 0155  AST 21 23  ALT 29 26  ALKPHOS 52 65  BILITOT 0.8 0.6  PROT 5.2* 5.8*  ALBUMIN 2.7* 2.9*   No results for input(s): LIPASE, AMYLASE in the last 168 hours. Recent Labs  Lab 08/09/18 1218  AMMONIA 17   Coagulation Profile: No results for input(s): INR, PROTIME in the last 168 hours. Cardiac Enzymes: Recent Labs  Lab 08/09/18 0255 08/09/18 0531 08/09/18 1218 08/13/18 0155  TROPONINI 0.35* 0.38* 0.37* 0.32*   BNP (last 3 results) No results  for input(s): PROBNP in the last 8760 hours. HbA1C: No results for input(s): HGBA1C in the last 72 hours. CBG: Recent Labs  Lab 08/09/18 1159 08/09/18 1640 08/09/18 2126 08/10/18 0643 08/10/18 1139  GLUCAP 123* 116* 116* 100* 134*   Lipid Profile: No results for input(s): CHOL, HDL, LDLCALC, TRIG, CHOLHDL, LDLDIRECT in the last 72 hours. Thyroid Function Tests: No results for input(s): TSH, T4TOTAL, FREET4, T3FREE, THYROIDAB in the last 72 hours. Anemia Panel: No results for input(s): VITAMINB12, FOLATE, FERRITIN, TIBC, IRON, RETICCTPCT in the last 72 hours. Urine analysis:    Component Value Date/Time   COLORURINE YELLOW 07/31/2018 2035   APPEARANCEUR CLEAR 07/31/2018 2035   LABSPEC 1.017 07/31/2018 2035   PHURINE 5.0 07/31/2018 2035   GLUCOSEU NEGATIVE 07/31/2018 2035   HGBUR NEGATIVE 07/31/2018 2035   BILIRUBINUR NEGATIVE 07/31/2018 2035   KETONESUR NEGATIVE 07/31/2018 2035   PROTEINUR NEGATIVE 07/31/2018 2035   NITRITE NEGATIVE 07/31/2018 2035   LEUKOCYTESUR NEGATIVE 07/31/2018 2035     Marycatherine Maniscalco M.D. [facility] 08/14/2018, 10:14 AM  Pager: [phone] Between 7am to 7pm - call Pager -  [phone]  After 7pm go to [email or web address] - password [email or web address]  Call night coverage person covering after 7pm

## 2018-08-14 NOTE — Procedures (Signed)
PROCEDURE SUMMARY:  Successful image-guided right thoracentesis. Yielded 1.2 liters of bloody fluid. Patient tolerated procedure well. No immediate complications.  Specimen was sent for labs. CXR ordered.  Joaquim Nam PA-C 08/14/2018 1:43 PM

## 2018-08-14 NOTE — Plan of Care (Signed)
  Problem: Activity: Goal: Risk for activity intolerance will decrease Outcome: Progressing   Problem: Nutrition: Goal: Adequate nutrition will be maintained Outcome: Progressing   Problem: Coping: Goal: Level of anxiety will decrease Outcome: Progressing   Problem: Elimination: Goal: Will not experience complications related to bowel motility Outcome: Progressing Goal: Will not experience complications related to urinary retention Outcome: Progressing   Problem: Pain Managment: Goal: General experience of comfort will improve Outcome: Progressing   Problem: Safety: Goal: Ability to remain free from injury will improve Outcome: Progressing   Problem: Skin Integrity: Goal: Risk for impaired skin integrity will decrease Outcome: Progressing   Problem: Education: Goal: Ability to demonstrate management of disease process will improve Outcome: Progressing Goal: Ability to verbalize understanding of medication therapies will improve Outcome: Progressing   Problem: Activity: Goal: Capacity to carry out activities will improve Outcome: Progressing   Problem: Cardiac: Goal: Ability to achieve and maintain adequate cardiopulmonary perfusion will improve Outcome: Progressing   Problem: Education: Goal: Individualized Educational Video(s) Outcome: Not Met (add Reason)

## 2018-08-14 NOTE — Progress Notes (Addendum)
[address]Suite [address]       [place] [address]             [phone]         Subjective: Breathing is more comfortable after receiving some diuretic  Objective: Vital signs in last 24 hours: Temp:  [98.1 F (36.7 C)-98.8 F (37.1 C)] 98.3 F (36.8 C) (10/13 0446) Pulse Rate:  [66-78] 70 (10/13 0446) Cardiac Rhythm: Normal sinus rhythm (10/13 0700) Resp:  [18] 18 (10/13 0446) BP: (100-155)/(46-56) 155/50 (10/13 0446) SpO2:  [87 %-100 %] 95 % (10/13 0446) Weight:  [86.2 kg] 86.2 kg (10/13 0446)  Hemodynamic parameters for last 24 hours:    Intake/Output from previous day: 10/12 0701 - 10/13 0700 In: 720 [P.O.:720] Out: 3101 [Urine:3100; Stool:1] Intake/Output this shift: No intake/output data recorded.  General appearance: alert, cooperative and no distress Heart: regular rate and rhythm Lungs: clear to auscultation bilaterally Abdomen: benign Extremities: no edema Wound: incis healing well  Lab Results: Recent Labs    08/13/18 0155 08/14/18 0425  WBC 13.0* 9.3  HGB 9.0* 8.7*  HCT 30.1* 27.2*  PLT 471* 399   BMET:  Recent Labs    08/13/18 0155 08/13/18 2224 08/14/18 0425  NA 131*  --  128*  K 4.5 4.6 5.0  CL 96*  --  90*  CO2 25  --  28  GLUCOSE 105*  --  191*  BUN 15  --  16  CREATININE 1.65*  --  1.54*  CALCIUM 8.6*  --  8.7*    PT/INR: No results for input(s): LABPROT, INR in the last 72 hours. ABG    Component Value Date/Time   PHART 7.411 08/02/2018 0119   HCO3 22.9 08/02/2018 0119   TCO2 22 08/02/2018 1634   ACIDBASEDEF 1.0 08/02/2018 0119   O2SAT 59.6 08/03/2018 0450   CBG (last 3)  No results for input(s): GLUCAP in the last 72 hours.  Meds Scheduled Meds: . amiodarone  200 mg Oral BID   Followed by  . [START ON 08/16/2018] amiodarone  200 mg Oral Daily  . aspirin EC  81 mg Oral Daily  . atorvastatin  20 mg Oral Daily  . cholecalciferol  1,000 Units Oral QPM  . fluticasone  1 spray Each Nare BID  .  furosemide  40 mg Intravenous BID  . ipratropium-albuterol  3 mL Nebulization Once  . ipratropium-albuterol  3 mL Nebulization Q6H  . loratadine  10 mg Oral Daily  . metoprolol tartrate  25 mg Oral BID  . multivitamin with minerals  1 tablet Oral QHS  . vitamin E  100 Units Oral QHS   Continuous Infusions: . ceFEPime (MAXIPIME) IV 1 g (08/13/18 1354)   PRN Meds:.acetaminophen, guaiFENesin, ipratropium-albuterol  Xrays Dg Chest 2 View  Result Date: 08/13/2018 CLINICAL DATA:  Bypass surgery 2 weeks ago. Now shortness of breath. Diagnosed with right upper lobe lung cancer. EXAM: CHEST - 2 VIEW COMPARISON:  08/10/2018 FINDINGS: Right hilar mass with postobstructive consolidation and collapse in the right upper lung. Small bilateral pleural effusions with basilar atelectasis. Postoperative changes in the mediastinum. Mild cardiac enlargement. No vascular congestion or edema. IMPRESSION: Right hilar mass with postobstructive consolidation and collapse in the right upper lung. Small bilateral pleural effusions with basilar atelectasis. Electronically Signed   By: Lucienne Capers M.D.   On: 08/13/2018 01:57   Ct Chest Wo Contrast  Result Date: 08/13/2018 CLINICAL DATA:  Extensive small cell carcinoma of  lung. Worsened dyspnea. Evaluate for worsening pleural effusion and SVC compression. End-stage kidney disease. EXAM: CT CHEST WITHOUT CONTRAST TECHNIQUE: Multidetector CT imaging of the chest was performed following the standard protocol without IV contrast. COMPARISON:  08/13/2018 chest radiograph.  CT 08/09/2018. FINDINGS: Cardiovascular: Mild motion degradation throughout. Aortic and branch vessel atherosclerosis. Median sternotomy for CABG. Mild cardiomegaly, without pericardial effusion. Native coronary artery atherosclerosis. Decrease in retrosternal edema, without postoperative fluid collection. Mediastinum/Nodes: Right paramediastinal soft tissue density which likely represents a combination of  adenopathy and surrounding collapse. Estimated on the order of 3.6 x 3.9 cm on image 54/3. Subcarinal node of 1.7 cm on image 78/3. Hilar regions not well evaluated. IVC not delineated. Prevascular adenopathy again identified at 1.4 cm on image 58/3. Lungs/Pleura: Enlargement of a right pleural effusion with increase in small left pleural effusion. No loculation identified. No pneumothorax. Right upper lobe endobronchial compression or obstruction, similar to slightly increased compared to 08/09/2018. Again identified is right upper lobe collapse, presumably secondary to central mass and adenopathy. Moderate centrilobular emphysema. Progressive left lower lobe dependent atelectasis. Upper Abdomen: Hyperattenuation within the liver. Normal imaged portions of the spleen, stomach, pancreas, gallbladder, kidneys, left adrenal gland. Mild right adrenal nodularity. Upper abdominal adenopathy again identified, with a portacaval node measuring 1.9 cm. Musculoskeletal: Prior median sternotomy. Moderate thoracic spondylosis. IMPRESSION: 1. Mild motion degradation. 2. Since 4 days ago, increase in moderate right and small left pleural effusions with progressive left lower lobe atelectasis. 3. Extensive thoracic adenopathy with right upper lobe collapse, likely secondary to adenopathy and possible central right upper lobe tumor. 4. SVC cannot be evaluated secondary to lack of IV contrast. The presumed adenopathy and tumor are centered along the expected course of the SVC. 5. Upper abdominal adenopathy, suspicious for metastatic disease. 6. Hyperattenuation within the liver which is nonspecific. 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 7. Aortic atherosclerosis (ICD10-I70.0) and emphysema (ICD10-J43.9). Electronically Signed   By: Abigail Miyamoto M.D.   On: 08/13/2018 17:22    Assessment/Plan:  1 appears more stable this am. From CT surgical perspective , do rec US guided right thoracentesis .  Needs to start chemo and  RT for tumor management 2 conts aggressive medical management as outlined per hospitalist  John Giovanni PA-C 08/14/2018 Pager [phone]  To have right thoracentesis today US guided  Discuss with oncology to move to [facility] for RX planning. I have seen and examined [name] and agree with the above assessment  and plan.  Grace Isaac MD Beeper [phone] Office [phone] 08/14/2018 1:32 PM

## 2018-08-14 NOTE — Progress Notes (Signed)
Pt wants some Tylenol for pain. There is no pain medication on file for pt. NP on call was notified. Will continue to [facility]

## 2018-08-15 ENCOUNTER — Encounter (HOSPITAL_COMMUNITY): Payer: Self-pay

## 2018-08-15 ENCOUNTER — Ambulatory Visit (HOSPITAL_COMMUNITY): Admission: RE | Admit: 2018-08-15 | Payer: BLUE CROSS/BLUE SHIELD | Source: Ambulatory Visit

## 2018-08-15 ENCOUNTER — Inpatient Hospital Stay (HOSPITAL_COMMUNITY): Payer: BLUE CROSS/BLUE SHIELD

## 2018-08-15 ENCOUNTER — Inpatient Hospital Stay: Payer: BLUE CROSS/BLUE SHIELD

## 2018-08-15 LAB — CBC
HCT: 30 % — ABNORMAL LOW (ref 39.0–52.0)
HEMOGLOBIN: 9.4 g/dL — AB (ref 13.0–17.0)
MCH: 28.1 pg (ref 26.0–34.0)
MCHC: 31.3 g/dL (ref 30.0–36.0)
MCV: 89.8 fL (ref 80.0–100.0)
Platelets: 469 10*3/uL — ABNORMAL HIGH (ref 150–400)
RBC: 3.34 MIL/uL — AB (ref 4.22–5.81)
RDW: 13.6 % (ref 11.5–15.5)
WBC: 13.8 10*3/uL — AB (ref 4.0–10.5)
nRBC: 0 % (ref 0.0–0.2)

## 2018-08-15 LAB — BASIC METABOLIC PANEL
Anion gap: 10 (ref 5–15)
BUN: 18 mg/dL (ref 8–23)
CHLORIDE: 88 mmol/L — AB (ref 98–111)
CO2: 30 mmol/L (ref 22–32)
CREATININE: 1.48 mg/dL — AB (ref 0.61–1.24)
Calcium: 9 mg/dL (ref 8.9–10.3)
GFR calc Af Amer: 51 mL/min — ABNORMAL LOW (ref >60)
GFR calc non Af Amer: 44 mL/min — ABNORMAL LOW (ref >60)
Glucose, Bld: 141 mg/dL — ABNORMAL HIGH (ref 70–99)
POTASSIUM: 4.7 mmol/L (ref 3.5–5.1)
SODIUM: 128 mmol/L — AB (ref 135–145)

## 2018-08-15 LAB — PH, BODY FLUID: pH, Body Fluid: 7.5

## 2018-08-15 LAB — EXPECTORATED SPUTUM ASSESSMENT W REFEX TO RESP CULTURE

## 2018-08-15 MED ORDER — FUROSEMIDE 40 MG PO TABS: 40.0000 mg | ORAL_TABLET | Freq: Two times a day (BID) | ORAL | 3 refills | Status: DC

## 2018-08-15 MED ORDER — TRAMADOL HCL 50 MG PO TABS: 50.0000 mg | ORAL_TABLET | Freq: Four times a day (QID) | ORAL | 0 refills | Status: DC | PRN

## 2018-08-15 MED ORDER — CYCLOBENZAPRINE HCL 5 MG PO TABS: 5.0000 mg | ORAL_TABLET | Freq: Two times a day (BID) | ORAL | 0 refills | Status: DC | PRN

## 2018-08-15 MED ORDER — AMOXICILLIN-POT CLAVULANATE 875-125 MG PO TABS: 1.0000 | ORAL_TABLET | Freq: Two times a day (BID) | ORAL | 0 refills | Status: AC

## 2018-08-15 MED ORDER — AMOXICILLIN-POT CLAVULANATE 875-125 MG PO TABS
1.0000 | ORAL_TABLET | Freq: Two times a day (BID) | ORAL | Status: DC
  Administered 2018-08-15: 1 via ORAL
  Filled 2018-08-15: qty 1

## 2018-08-15 MED ORDER — TRAMADOL HCL 50 MG PO TABS
50.0000 mg | ORAL_TABLET | Freq: Once | ORAL | Status: DC
  Filled 2018-08-15: qty 1

## 2018-08-15 MED ORDER — GI COCKTAIL ~~LOC~~
30.0000 mL | Freq: Once | ORAL | Status: AC
  Administered 2018-08-15: 30 mL via ORAL
  Filled 2018-08-15: qty 30

## 2018-08-15 MED ORDER — APIXABAN 5 MG PO TABS
5.0000 mg | ORAL_TABLET | Freq: Two times a day (BID) | ORAL | Status: DC
  Administered 2018-08-15: 5 mg via ORAL
  Filled 2018-08-15: qty 1

## 2018-08-15 MED ORDER — ALBUTEROL SULFATE HFA 108 (90 BASE) MCG/ACT IN AERS: 2.0000 | INHALATION_SPRAY | Freq: Four times a day (QID) | RESPIRATORY_TRACT | 2 refills | Status: DC | PRN

## 2018-08-15 MED ORDER — TRAMADOL HCL 50 MG PO TABS
50.0000 mg | ORAL_TABLET | Freq: Four times a day (QID) | ORAL | Status: DC | PRN
  Administered 2018-08-15: 50 mg via ORAL
  Filled 2018-08-15: qty 1

## 2018-08-15 MED ORDER — ONDANSETRON 4 MG PO TBDP: 4.0000 mg | ORAL_TABLET | Freq: Three times a day (TID) | ORAL | 0 refills | Status: DC | PRN

## 2018-08-15 MED ORDER — FUROSEMIDE 40 MG PO TABS
40.0000 mg | ORAL_TABLET | Freq: Two times a day (BID) | ORAL | Status: DC
  Administered 2018-08-15: 40 mg via ORAL
  Filled 2018-08-15: qty 1

## 2018-08-15 NOTE — Progress Notes (Signed)
   08/15/18 0040  Vitals  BP (!) 126/55  MAP (mmHg) 76  BP Location Right Arm  BP Method Automatic  Patient Position (if appropriate) Lying  Pulse Rate 67  Pulse Rate Source Monitor  Resp 16  Oxygen Therapy  SpO2 97 %  O2 Device Nasal Cannula  O2 Flow Rate (L/min) 2 L/min  Pain Assessment  Pain Scale 0-10  Pain Score 9  Pain Type Acute pain  Pain Location Chest  Pain Orientation Right  Pain Descriptors / Indicators Aching  Pain Frequency Constant  Pain Onset Sudden  Patient called on RN complaining of sudden onset of CP. EKG performed. MD notified. No acute changes noted on EKG. Awaiting callback from provider.

## 2018-08-15 NOTE — Evaluation (Signed)
Physical Therapy Evaluation and Discharge Patient Details Name: Gregory Bates MRN: [id number] DOB: 06-26-1941 Today's Date: 08/15/2018   History of Present Illness  Pt is a 77 y/o male admitted secondary to worsening SOB and acute on chronic respiratory failure. Pt recently diagnosed with R lung cancer and found to have bilateral pleural edema. Pt is s/p R thoracentesis. During admission, pt with chest pain and found to have R hilar mass with collapse of R upper lung on imaging. PMH includes CAD s/p CABG, DM, HTN, Ischemic cardiomyopathy, and a fib.   Clinical Impression  Pt admitted secondary to problem above with deficits below. Mild unsteadiness upon standing, and pt requesting grandson to assist, so unsure of how much assist needed to stand. Overall steady during ambulation requiring min guard to supervision with use of RW. Educated about safe stair management at home, energy conservation techniques, and walking program to perform at home. Pt and family reports they do not feel pt needs continued or follow up PT at this time. Reports grandson will be able to assist as needed upon d/c. Will sign off. If needs change, please reconsult.     Follow Up Recommendations No PT follow up    Equipment Recommendations  None recommended by PT    Recommendations for Other Services       Precautions / Restrictions Precautions Precautions: Sternal Precaution Booklet Issued: No Precaution Comments: Recent CABG  Restrictions Weight Bearing Restrictions: Yes(sternal )      Mobility  Bed Mobility               General bed mobility comments: In chair upon entry   Transfers Overall transfer level: Needs assistance Equipment used: Rolling walker (2 wheeled) Transfers: Sit to/from Stand Sit to Stand: Min assist         General transfer comment: Attempted to stand with PT, however, pt unsteady and requesting assist from his grandson. Pt requiring hands on assist from grandson to stand,  however, unsure of amount of assist. Pt reports grandson assists him at home.   Ambulation/Gait Ambulation/Gait assistance: Min guard;Supervision Gait Distance (Feet): 50 Feet Assistive device: Rolling walker (2 wheeled) Gait Pattern/deviations: Step-through pattern;Decreased stride length Gait velocity: Decreased    General Gait Details: Slow, cautious gait, however, overall steady. Educated about walking program to perform at home. Distance limited secondary to fatigue.   Stairs Stairs: Yes       General stair comments: Verbally reviewed safe stair management with use of rails and step to pattern.   Wheelchair Mobility    Modified Rankin (Stroke Patients Only)       Balance Overall balance assessment: Needs assistance Sitting-balance support: No upper extremity supported;Feet supported Sitting balance-Leahy Scale: Good     Standing balance support: During functional activity;Bilateral upper extremity supported Standing balance-Leahy Scale: Fair Standing balance comment: Able to maintain static standing without UE support                              Pertinent Vitals/Pain Pain Assessment: Faces Faces Pain Scale: Hurts a little bit Pain Location: R upper chest  Pain Descriptors / Indicators: Discomfort;Grimacing Pain Intervention(s): Limited activity within patient's tolerance;Monitored during session;Repositioned    Home Living Family/patient expects to be discharged to:: Private residence Living Arrangements: Spouse/significant other;Other relatives(grandson) Available Help at Discharge: Family;Available 24 hours/day Type of Home: House Home Access: Stairs to enter Entrance Stairs-Rails: Psychiatric nurse of Steps: 3 Home Layout: Multi-level;Able to  live on main level with bedroom/bathroom Home Equipment: Gilford Rile - 2 wheels;Walker - 4 wheels;Shower seat - built in      Prior Function Level of Independence: Independent with assistive  device(s)         Comments: Reports using rollator      Hand Dominance        Extremity/Trunk Assessment   Upper Extremity Assessment Upper Extremity Assessment: Overall WFL for tasks assessed    Lower Extremity Assessment Lower Extremity Assessment: Generalized weakness    Cervical / Trunk Assessment Cervical / Trunk Assessment: Normal  Communication   Communication: No difficulties  Cognition Arousal/Alertness: Awake/alert Behavior During Therapy: WFL for tasks assessed/performed Overall Cognitive Status: Within Functional Limits for tasks assessed                                        General Comments General comments (skin integrity, edema, etc.): Educated about energy conservation techniques for home.     Exercises     Assessment/Plan    PT Assessment Patent does not need any further PT services  PT Problem List         PT Treatment Interventions      PT Goals (Current goals can be found in the Care Plan section)  Acute Rehab PT Goals Patient Stated Goal: to go home soon  PT Goal Formulation: With patient Time For Goal Achievement: 08/15/18 Potential to Achieve Goals: Good    Frequency     Barriers to discharge        Co-evaluation               AM-PAC PT "6 Clicks" Daily Activity  Outcome Measure Difficulty turning over in bed (including adjusting bedclothes, sheets and blankets)?: A Little Difficulty moving from lying on back to sitting on the side of the bed? : A Little Difficulty sitting down on and standing up from a chair with arms (e.g., wheelchair, bedside commode, etc,.)?: Unable Help needed moving to and from a bed to chair (including a wheelchair)?: A Little Help needed walking in hospital room?: A Little Help needed climbing 3-5 steps with a railing? : A Lot 6 Click Score: 15    End of Session   Activity Tolerance: Patient limited by fatigue Patient left: in chair;with call bell/phone within reach;with  family/visitor present Nurse Communication: Mobility status PT Visit Diagnosis: Other abnormalities of gait and mobility (R26.89);Muscle weakness (generalized) (M62.81)    Time: 4193-7902 PT Time Calculation (min) (ACUTE ONLY): 13 min   Charges:   PT Evaluation $PT Eval Low Complexity: [place], PT, DPT  Acute Rehabilitation Services  Pager: [phone] Office: [phone]   Rudean Hitt 08/15/2018, 11:15 AM

## 2018-08-15 NOTE — Progress Notes (Signed)
Patient discharged: Home with family and O2  Via: Wheelchair   Discharge paperwork given: to patient and family  Reviewed with teach back  IV and telemetry disconnected  Belongings given to patient  Venetia Night, RN

## 2018-08-15 NOTE — Significant Event (Signed)
Rapid Response Event Note  0325: called about pt c/o chest pain. Was with another call at the time. RN stated she had already called Md, EKG done, CXR ordered. Per RN, Md did not believe it to be a cardiac issue. RN requesting a second set of eyes. Informed her that I was on another call and will come see pt when able. RN agreed and did not feel it was urgent.  0350: Called back to check on pt status. RN stated he is doing better and only complaining of pain to the right side of chest where he had a thoracentesis done yesterday. Notified her that I will come see pt as soon as available. 5436: On arrival, pt resting in chair with some complaints of discomfort to R chest only with movements. Pt had received tylenol earlier in the night for discomfort. Suggested possibly trying something a little stronger for pain. Bedside RN paged Md for order. 0677: While looking thru chart, noticed CXR resulted showing R upper lobe collapse. Called bedside RN to made Md aware and hold off on pain medication until cleared with Md.  [phone]: Md notified and came to see pt. Ordered ICS, stated since pt stable at this time will report to day shift for further interventions.    Will continue to monitor pt status. Bedside RN notified to call with any changes/questions/concerns.    Sherilyn Dacosta

## 2018-08-15 NOTE — Progress Notes (Addendum)
Called several times by bedside RN with concerns for pt recurring CP. On assessment, pt does not appear to be in any respiratory distress. VSS. Pt states the pain is present with movement and deep inspiration. Chest xray ordered and reviewed. Shows recurring RUL collapse and hilar mass. Since pt is stable at this time. I will discuss make the oncoming attending aware of xray results so they can determine further treatment plans for pt.   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 May need additional intervention if SOB develops and pain worsens.  -chest xray performed -Encouraged IS -Continuous pulse Ox.  -Tramadol 50mg  po for pleuritic chest pain.   Lovey Newcomer, NP [facility] 7p-7a [phone]

## 2018-08-15 NOTE — Progress Notes (Signed)
Notified provider of chest xray results. States she will look at notes and call RN back. Will continue to monitor pt.

## 2018-08-15 NOTE — Progress Notes (Signed)
Pt was having chest pain and rated it at a 9 on a scale of 0-10. Two minutes later he rated it a 6. Then two minutes later a 0. I paged the doctor and no new orders were given. I will continue to monitor.

## 2018-08-15 NOTE — Progress Notes (Signed)
Patient complaining of pain in right chest. States pain is worse with movement. RN assessed lung sound. Lung sound diminished on right side. Np Blount paged to notify and ask if a chest x-ray could be ordered since patient is post thoracentesis. Rapid response also paged to further assess patient. Awaiting call back from provider. Will continue to monitor patient

## 2018-08-15 NOTE — Discharge Summary (Signed)
Physician Discharge Summary   Patient ID: Gregory Bates MRN: [id number] DOB/AGE: 1941-01-19 77 y.o.  Admit date: 08/13/2018 Discharge date: 08/15/2018  Primary Care Physician:  Street, Sharon Mt, MD   Recommendations for Outpatient Follow-up:  1. Follow up with PCP in 1-2 weeks 2. Please obtain BMET on 10/17 3. Patient is scheduled for outpatient PET/CT, CT simulation for radiation therapy on 10/17 4. Please follow cytology from the thoracentesis done on 10/13  Home Health: None Equipment/Devices: none   Discharge Condition: Guarded CODE STATUS: FULL Diet recommendation: Regular diet   Discharge Diagnoses:    . Acute on chronic respiratory failure with hypoxia ([place]) . Bilateral pleural effusions right worse than left Collapse of the right upper lobe Extensive primary small cell CA lung  Possible acute on chronic diastolic CHF Possible postobstructive pneumonia CKD stage III Chronic hyponatremia Anemia normocytic possibly due to malignancy CAD status post recent CABG    Consults:  CT surgery, Dr. Servando Snare Oncology, Dr Maylon Peppers and Dr. Julien Nordmann Interventional radiology    Allergies:   Allergies  Allergen Reactions  . Brilinta [Ticagrelor] Other (See Comments)    Arthralgias & myalgias  . Crestor [Rosuvastatin] Other (See Comments)    Myalgias     DISCHARGE MEDICATIONS: Allergies as of 08/15/2018      Reactions   Brilinta [ticagrelor] Other (See Comments)   Arthralgias & myalgias   Crestor [rosuvastatin] Other (See Comments)   Myalgias      Medication List    STOP taking these medications   prochlorperazine 10 MG tablet Commonly known as:  COMPAZINE     TAKE these medications   acetaminophen 325 MG tablet Commonly known as:  TYLENOL Take 2 tablets (650 mg total) by mouth every 6 (six) hours as needed for mild pain.   albuterol 108 (90 Base) MCG/ACT inhaler Commonly known as:  PROVENTIL HFA;VENTOLIN HFA Inhale 2 puffs into the lungs every 6  (six) hours as needed for wheezing or shortness of breath.   amiodarone 200 MG tablet Commonly known as:  PACERONE Take 1 tablet (200 mg total) by mouth 2 (two) times daily. For one week;then take 200 mg daily thereafter What changed:    when to take this  additional instructions   amoxicillin-clavulanate 875-125 MG tablet Commonly known as:  AUGMENTIN Take 1 tablet by mouth 2 (two) times daily for 7 days.   aspirin 81 MG EC tablet Take 1 tablet (81 mg total) by mouth daily.   atorvastatin 20 MG tablet Commonly known as:  LIPITOR Take 1 tablet (20 mg total) by mouth daily.   cholecalciferol 1000 units tablet Commonly known as:  VITAMIN D Take 1,000 Units by mouth every evening.   cyclobenzaprine 5 MG tablet Commonly known as:  FLEXERIL Take 1 tablet (5 mg total) by mouth 2 (two) times daily as needed for muscle spasms (cramping).   ELIQUIS 5 MG Tabs tablet Generic drug:  apixaban TAKE 1 TABLET BY MOUTH TWICE DAILY What changed:  how much to take   fexofenadine 180 MG tablet Commonly known as:  ALLEGRA Take 180 mg by mouth daily as needed for allergies.   fluticasone 50 MCG/ACT nasal spray Commonly known as:  FLONASE Place 1 spray into both nostrils daily as needed for allergies.   furosemide 40 MG tablet Commonly known as:  LASIX Take 1 tablet (40 mg total) by mouth 2 (two) times daily.   guaiFENesin 600 MG 12 hr tablet Commonly known as:  MUCINEX Take 1 tablet (600 mg  total) by mouth 2 (two) times daily as needed for cough or to loosen phlegm.   metoprolol tartrate 25 MG tablet Commonly known as:  LOPRESSOR Take 1 tablet (25 mg total) by mouth 2 (two) times daily.   multivitamin tablet Take 1 tablet by mouth at bedtime.   ondansetron 4 MG disintegrating tablet Commonly known as:  ZOFRAN-ODT Take 1 tablet (4 mg total) by mouth every 8 (eight) hours as needed for nausea or vomiting.   traMADol 50 MG tablet Commonly known as:  ULTRAM Take 1-2 tablets  (50-100 mg total) by mouth every 6 (six) hours as needed for moderate pain or severe pain. Take 50 mg by mouth every 6-8 hours PRN for moderate pain. You can take 2 tabs for severe pain. What changed:    how much to take  how to take this  when to take this  reasons to take this  additional instructions   vitamin E 100 UNIT capsule Take 100 Units by mouth at bedtime.        Brief H and P: For complete details please refer to admission H and P, but in briefPatient is a 77 year old male with paroxysmal atrial fibrillation on anticoagulation Eliquis, DM, ischemic cardiomyopathy, chronically elevated troponin, grade 2 diastolic CHF, 45-[id number]-[id number] smoking history, quit 30 years ago, CKD stage III, hypertension, CAD, recent CABG on 08/01/2018, small cell lung CA, followed by Dr. Julien Nordmann.  Patient reported right squamous cell CA was diagnosed during recent CABG surgery, postop period complicated with diarrhea, shortness of breath, seizure-like activity, occlusion of the bronchus with resultant collapse of the right upper lobe of the lung.  Patient was seen by oncology, outpatient thoracentesis was planned on 10/14.  Patient presented to ED with sudden onset of shortness of breath and night before the admission, no fevers or chills.  Patient also reported bilateral upper extremity swelling worse on the right.  Patient was admitted for further work-up.   Hospital Course:  Acute respiratory failure with hypoxia : Multifactorial secondary to extensive primary small cell CA lung, bilateral pleural effusion Rt>Lt, collapse of the right upper lobe -Patient was also placed on diuresis in ED, symptomatically felt better after diuresis -Ultrasound-guided thoracentesis was scheduled outpatient on 10/14, Eliquis already been held since 10/11 a.m.  -Chest x-ray showed right hilar mass with obstructive consolidation and collapse in the right upper lung, bilateral pleural effusions right slightly larger than  left.  Repeat CT chest on 10/12 showed enlarging right pleural effusion, SVC could not be evaluated secondary to lack of IV contrast.  Upper abdominal adenopathy suspicious for metastatic disease - patient was seen by oncology, Dr Maylon Peppers, discussed in detail, low suspicion for SVC syndrome.  Recommended thoracentesis, no need of IV steroids, Decadron discontinued -Discussed with Dr. Servando Snare, recommended ultrasound-guided thoracentesis with IR -Patient underwent ultrasound-guided thoracentesis, removing 1.2 L of fluid, studies so far negative, follow cytology -Patient symptomatically feeling better, back to 2 L O2 via nasal cannula at his baseline -Transition to oral Lasix 40 mg twice a day, recheck labs BMET on 10/17, if renal function is worse,  then Lasix will need to be titrated down.  Patient also advised to decrease Lasix if feeling dizzy, lightheaded or hypotension. -Last night, patient had episode of right-sided pleuritic chest pain, improved with tramadol.  At the time of my examination, chest pain has resolved -Discussed with Dr. Julien Nordmann on the phone, okay to discharge patient home as he has scheduled PET/CT and radiation on 10/17, chemo to start next  week. -Discussed in detail with the patient, grandson and wife at the bedside, patient feels back to baseline and wants to go home.   Bilateral pleural effusion with possible acute on chronic diastolic CHF -Recent 2D echo 9/26 showed EF of 55 to 60% with grade 2 diastolic dysfunction -Initially patient was placed on 60 mg IV twice a day, but had cramping with the dose.  Lasix was decreased down to 40 mg twice a day which he tolerated well, transitioned to oral at the time of discharge.  Patient was on 20 mg daily Lasix prior to admission. -Negative balance of 3.2 L, weight down from 192 at the time of admission to 185lbs, will need to follow-up with Dr. Angelena Form for further recommendations.  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  Discussed with Dr. Julien Nordmann, patient's primary oncologist, recommended to discharge home and follow-up on 10/17 at [facility] for PET/CT, XRT.   Possible postobstructive pneumonia -Patient had leukocytosis on the time of admission. No fevers, placed on Augmentin for 7 days  Paroxysmal atrial fibrillation Currently rate controlled, follows Dr. Angelena Form, has been on Eliquis, currently in NSR Postoperatively, after CABG patient had atrial fibrillation with RVR Eliquis was held for thoracentesis, discussed with Dr. Julien Nordmann, patient may restart Eliquis, risk of PE, stroke secondary to malignancy and sedentary.  CKD stage III -Baseline 1.3-1.5 -Creatinine 1.6 at the time of admission, improved to 1.4 at the time of discharge, at baseline now   Chronic hyponatremia Likely due to underlying SIADH from lung mass, baseline 127-1 30 Sodium 128 at the time of discharge.  And oriented.  Anemia, normocytic, possibly due to malignancy -Baseline between 8-9 -Hemoglobin 9.4 at baseline  Coronary artery disease status post recent CABG: -CT surgery consulted, patient was seen by Dr. Servando Snare, agreed with current management.  Patient is to start radiation and chemo soon, scheduled on 10/17  Day of Discharge S: Patient feels a lot better today, overnight had chest pain episode, right side, pleuritic.  Improved with tramadol.  At the time of my examination, no significant chest pain.  Sitting up in the chair, wants to go home  BP (!) 122/47 (BP Location: Left Arm)   Pulse 75   Temp 98.2 F (36.8 C) (Oral)   Resp 18   Ht 5\' 11"  (1.803 m)   Wt 84.1 kg Comment: scale b  SpO2 94%   BMI 25.84 kg/m   Physical Exam: General: Alert and awake oriented x3 not in any  acute distress. HEENT: anicteric sclera, pupils reactive to light and accommodation CVS: S1-S2 clear no murmur rubs or gallops Chest: Decreased breath sound at the bases rt>lt  Abdomen: soft nontender, nondistended, normal bowel sounds Extremities: no cyanosis, clubbing or edema noted bilaterally Neuro: Cranial nerves II-XII intact, no focal neurological deficits   The results of significant diagnostics from this hospitalization (including imaging, microbiology, ancillary and laboratory) are listed below for reference.      Procedures/Studies:  Dg Chest 1 View  Result Date: 08/14/2018 CLINICAL DATA:  Post thoracentesis. EXAM: CHEST  1 VIEW COMPARISON:  Chest x-ray dated 08/13/2018. Chest CT dated 08/13/2018. FINDINGS: Improved aeration at the RIGHT lung base status post thoracentesis. No pneumothorax seen. Heart size and mediastinal contours are stable. IMPRESSION: Improved aeration of the RIGHT lung  status post thoracentesis. No pneumothorax seen. Electronically Signed   By: Franki Cabot M.D.   On: 08/14/2018 14:20   Dg Chest 2 View  Result Date: 08/13/2018 CLINICAL DATA:  Bypass surgery 2 weeks ago. Now shortness of breath. Diagnosed with right upper lobe lung cancer. EXAM: CHEST - 2 VIEW COMPARISON:  08/10/2018 FINDINGS: Right hilar mass with postobstructive consolidation and collapse in the right upper lung. Small bilateral pleural effusions with basilar atelectasis. Postoperative changes in the mediastinum. Mild cardiac enlargement. No vascular congestion or edema. IMPRESSION: Right hilar mass with postobstructive consolidation and collapse in the right upper lung. Small bilateral pleural effusions with basilar atelectasis. Electronically Signed   By: Lucienne Capers M.D.   On: 08/13/2018 01:57   Dg Chest 2 View  Result Date: 08/05/2018 CLINICAL DATA:  CABG. EXAM: CHEST - 2 VIEW COMPARISON:  08/03/2018. FINDINGS: Interim removal of right IJ sheath. Prior CABG. Left atrial appendage  clip noted in stable position. Stable cardiomegaly. Right upper lobe atelectatic changes, progressed from prior exam. Small bilateral pleural effusions, progressed from prior exam. No pneumothorax. IMPRESSION: 1.  Interim removal of right IJ sheath.  No pneumothorax. 2. Prior CABG. Left atrial appendage clip in stable position. Stable cardiomegaly. 3. Progressive right upper lobe atelectasis. Slight progression of bilateral small pleural effusions. Electronically Signed   By: Marcello Moores  Register   On: 08/05/2018 08:23   Dg Chest 2 View  Result Date: 07/26/2018 CLINICAL DATA:  Chest pain EXAM: CHEST - 2 VIEW COMPARISON:  07/24/2018 chest radiograph. FINDINGS: Normal heart size. Prominence of the right paratracheal stripe/right superior hilar contour. Aortic atherosclerosis. No pneumothorax. No pleural effusion. Hyperinflated lungs. IMPRESSION: Prominence of the right paratracheal stripe/right suprahilar contour. Recommend chest CT with IV contrast to exclude adenopathy/lung mass. Hyperinflated lungs, suggesting COPD. Electronically Signed   By: Ilona Sorrel M.D.   On: 07/26/2018 01:14   Dg Chest 2 View  Result Date: 07/24/2018 CLINICAL DATA:  Pt reports cough x yesterday and left sided chest pain for over 1 hour; Hx of MI w/ 5 stents, DM, and HTN; former smoker EXAM: CHEST - 2 VIEW COMPARISON:  07/19/2018 FINDINGS: Cardiac silhouette is normal in size and configuration no mediastinal or hilar masses. No evidence of adenopathy. Clear lungs.  No pleural effusion or pneumothorax. Skeletal structures are demineralized but intact. IMPRESSION: No active cardiopulmonary disease. Electronically Signed   By: Lajean Manes M.D.   On: 07/24/2018 12:17   Ct Head Wo Contrast  Result Date: 08/09/2018 CLINICAL DATA:  Altered mental status EXAM: CT HEAD WITHOUT CONTRAST TECHNIQUE: Contiguous axial images were obtained from the base of the skull through the vertex without intravenous contrast. COMPARISON:  Head CT 07/31/2009  FINDINGS: Brain: There is no hemorrhage or extra-axial collection. Unchanged appearance of calcified meningioma at the left sphenoid wing. The size and configuration of the ventricles and extra-axial CSF spaces are normal. There is no acute or chronic infarction. There is hypoattenuation of the periventricular white matter, most commonly indicating chronic ischemic microangiopathy. Vascular: Atherosclerotic calcification of the internal carotid arteries at the skull base. No abnormal hyperdensity of the major intracranial arteries or dural venous sinuses. Skull: The visualized skull base, calvarium and extracranial soft tissues are normal. Sinuses/Orbits: Left-greater-than-right maxillary sinus opacification with fluid levels. There is also a left mastoid effusion. The orbits are normal. IMPRESSION: 1. No acute intracranial abnormality. 2. Unchanged appearance of calcified left sphenoid wing meningioma. 3. Maxillary sinus opacification with fluid levels. Correlate for symptoms of acute sinusitis. Electronically  Signed   By: Ulyses Jarred M.D.   On: 08/09/2018 02:19   Ct Head Wo Contrast  Result Date: 07/30/2018 CLINICAL DATA:  Post heart catheterization now with subacute neurologic defects. EXAM: CT HEAD WITHOUT CONTRAST TECHNIQUE: Contiguous axial images were obtained from the base of the skull through the vertex without intravenous contrast. COMPARISON:  11/02/2017 FINDINGS: Brain: Similar findings of atrophy with centralized volume loss and mild commensurate ex vacuo dilatation the ventricular system. Re demonstrated scattered periventricular hypodensities, most conspicuous about the left centrum semiovale compatible with microvascular ischemic disease. Gray-white differentiation is otherwise well maintained without CT evidence of superimposed acute large territory infarct. Known approximately 2.1 x 1 cm meningioma involving the left sphenoid wing is unchanged compared to the 11/2017 examination (coronal  image 27, series 5). No intraparenchymal or extra-axial hemorrhage. Unchanged size and configuration of the ventricles and the basilar cisterns. No midline shift. Vascular: Intracranial atherosclerosis. Skull: Old fractures of the left maxillary sinus, orbit and zygomatic arch, improved compared to the 11/2017 examination. No acute displaced calvarial fracture. Sinuses/Orbits: Scattered opacification of the right anterior ethmoidal air cells. Mucosal thickening of the bilateral maxillary sinuses with small air-fluid level within the left maxillary sinus. Scattered opacification of the left mastoid air cells. Other: Regional soft tissues appear normal. IMPRESSION: 1. Similar findings of atrophy and microvascular ischemic disease without superimposed acute intracranial process. 2. Old fractures of the left maxillary sinus, orbit and zygomatic arch, improved compared to the 11/2017 examination. 3. Sinus disease as above, improved compared to the 11/2017 examination, though there is an air-fluid level within the left maxillary sinus as could be seen in the setting of acute sinusitis. 4. Grossly unchanged appearance of known approximately 2.1 cm left sphenoid wing calcified meningioma. Electronically Signed   By: Sandi Mariscal M.D.   On: 07/30/2018 13:34   Ct Chest Wo Contrast  Result Date: 08/13/2018 CLINICAL DATA:  Extensive small cell carcinoma of lung. Worsened dyspnea. Evaluate for worsening pleural effusion and SVC compression. End-stage kidney disease. EXAM: CT CHEST WITHOUT CONTRAST TECHNIQUE: Multidetector CT imaging of the chest was performed following the standard protocol without IV contrast. COMPARISON:  08/13/2018 chest radiograph.  CT 08/09/2018. FINDINGS: Cardiovascular: Mild motion degradation throughout. Aortic and branch vessel atherosclerosis. Median sternotomy for CABG. Mild cardiomegaly, without pericardial effusion. Native coronary artery atherosclerosis. Decrease in retrosternal edema, without  postoperative fluid collection. Mediastinum/Nodes: Right paramediastinal soft tissue density which likely represents a combination of adenopathy and surrounding collapse. Estimated on the order of 3.6 x 3.9 cm on image 54/3. Subcarinal node of 1.7 cm on image 78/3. Hilar regions not well evaluated. IVC not delineated. Prevascular adenopathy again identified at 1.4 cm on image 58/3. Lungs/Pleura: Enlargement of a right pleural effusion with increase in small left pleural effusion. No loculation identified. No pneumothorax. Right upper lobe endobronchial compression or obstruction, similar to slightly increased compared to 08/09/2018. Again identified is right upper lobe collapse, presumably secondary to central mass and adenopathy. Moderate centrilobular emphysema. Progressive left lower lobe dependent atelectasis. Upper Abdomen: Hyperattenuation within the liver. Normal imaged portions of the spleen, stomach, pancreas, gallbladder, kidneys, left adrenal gland. Mild right adrenal nodularity. Upper abdominal adenopathy again identified, with a portacaval node measuring 1.9 cm. Musculoskeletal: Prior median sternotomy. Moderate thoracic spondylosis. IMPRESSION: 1. Mild motion degradation. 2. Since 4 days ago, increase in moderate right and small left pleural effusions with progressive left lower lobe atelectasis. 3. Extensive thoracic adenopathy with right upper lobe collapse, likely secondary to adenopathy and possible  central right upper lobe tumor. 4. SVC cannot be evaluated secondary to lack of IV contrast. The presumed adenopathy and tumor are centered along the expected course of the SVC. 5. Upper abdominal adenopathy, suspicious for metastatic disease. 6. Hyperattenuation within the liver which is nonspecific. 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 7. Aortic atherosclerosis (ICD10-I70.0) and emphysema (ICD10-J43.9). Electronically Signed   By: Abigail Miyamoto M.D.   On: 08/13/2018  17:22   Ct Chest Wo Contrast  Result Date: 08/09/2018 CLINICAL DATA:  SOB. Hx small cell lung cancer, paroxysmal a-fib, HTN, diabetes, CHF, CAD, and CABG. EXAM: CT CHEST WITHOUT CONTRAST TECHNIQUE: Multidetector CT imaging of the chest was performed following the standard protocol without IV contrast. COMPARISON:  Chest CT 07/27/2018 FINDINGS: Cardiovascular: Coronary, aortic arch, and branch vessel atherosclerotic vascular disease. Mild cardiomegaly. Compared to the prior chest CT there has been interval CABG. Atrial appendage clip noted. Mediastinum/Nodes: Expected residual mediastinal edema. Trace residual pneumomediastinum, but nearly completely resolved. Small pericardial effusion, not unexpected in the postoperative setting. Pathologic mediastinal mass/adenopathy. An index AP window lymph node measures 1.7 cm in short axis on image 49/3, stable. The more bulky right paratracheal adenopathy likewise appears relatively stable. Some of this is in the vicinity of the SVC and is at least exerting extrinsic mass effect on the SVC. Lungs/Pleura: There complete obstruction of the right upper lobe bronchus with complete atelectasis of the right upper lobe. Possible bulging tumor into the right mainstem bronchus from the ostium of the right upper lobe bronchus on image 55/4. The bronchus intermedius and its branches appear patent. Moderate right and small left pleural effusions. The right pleural effusion was previously small and the left pleural effusion is new compared to the prior CT. There is passive atelectasis associated with the pleural effusions. Mild bilateral airway thickening. Paraseptal emphysema. Upper Abdomen: High-density liver with borderline appearance for hemochromatosis. Portacaval node abnormally enlarged at 2.2 cm in short axis on image 144/3, stable. Slight lobularity of the right adrenal gland without a well-defined mass. Musculoskeletal: Lower thoracic spondylosis. No findings of sternal  dehiscence. IMPRESSION: 1. Complete atelectasis of the right upper lobe. Probable tumor in the right upper lobe bronchus. Notable mediastinal and likely right hilar adenopathy along with a stable enlarged portacaval lymph node in the upper abdomen which could be malignant. 2. Postoperative findings related to recent CABG including a small residual amount of mediastinal edema and a trace amount of residual pneumomediastinum. 3. There is a moderate right pleural effusion (formerly small on 07/27/2018) and a new small left pleural effusion. These are associated with passive atelectasis. 4. Airway thickening is present, suggesting bronchitis or reactive airways disease. 5. Other imaging findings of potential clinical significance: Aortic Atherosclerosis (ICD10-I70.0). Coronary atherosclerosis with recent CABG. Emphysema (ICD10-J43.9). High-density hepatic parenchyma suspicious for hemochromatosis. Electronically Signed   By: Van Clines M.D.   On: 08/09/2018 02:30   Ct Chest Wo Contrast  Result Date: 07/27/2018 CLINICAL DATA:  Evaluate right paratracheal stripe/superior EXAM: CT CHEST WITHOUT CONTRAST TECHNIQUE: Multidetector CT imaging of the chest was performed following the standard protocol without IV contrast. COMPARISON:  CXR 07/26/2018 FINDINGS: Cardiovascular: Atherosclerosis of the great vessels at their origins with conventional branch pattern. Moderate atherosclerosis of the distal aortic arch and descending aorta without aneurysm. Left main and three-vessel dense coronary arteriosclerosis. Heart size is normal without pericardial effusion. Mediastinum/Nodes: Mild thyromegaly with retroclavicular extension of the thyroid gland without dominant mass seen. Mediastinal adenopathy along the prevascular, right  upper and lower paratracheal portions of the mediastinum, the largest estimated at 2.3 cm short axis along the right lower paratracheal portion given limitations of a noncontrast study. Mainstem  bronchi and trachea are patent. Assessment of hilar adenopathy is somewhat limited due to lack of IV contrast but given new areas of subtle soft tissue prominence, suspect that there is at least right-sided adenopathy since prior exam measuring up to 1.2 cm short axis. Lungs/Pleura: There is a partially necrotic appearing right paramediastinal soft tissue mass in the right upper lobe, the margins somewhat difficult to identify due to lack of IV contrast but is estimated as measuring approximately 3.5 x 3.4 x 3.2 cm, series 3/61 and series 6/40. Additionally, nodular opacities are noted along the major fissure in the posterior segment of right upper lobe, new since prior measuring up to 6 mm. Partially cavitary nodule in the right middle lobe is identified measuring up to 8 mm, series 4/72 with smaller sub 3 mm nodule, series 4/66. Dependent changes are noted along the posterior aspect of the right upper lobe. Small right effusion is present with sub pleural areas of streaky interstitial change likely atelectatic and less likely lymphangitic. Paraseptal and centrilobular emphysema is noted, upper lobe predominant with scarring at the apices. Mild peribronchial thickening and bronchiectasis is noted in the lower lobes. Upper Abdomen: No adrenal mass. No definite space-occupying mass of the included liver. Punctate calcification in the pole of the included left kidney. Musculoskeletal: No chest wall mass or suspicious bone lesions identified. IMPRESSION: 1. Partially necrotic masslike abnormality abutting the superior mediastinum within the medial right upper lobe measuring approximately 3.5 x 3.4 x 3.2 cm. This is associated with smaller nodular opacities in the right middle lobe and abutting the major fissure, the next largest is approximately 8 mm and partially cavitary in appearance in the right middle lobe. Pulmonary and/or cardiothoracic consultation is suggested. 2. There is associated mediastinal and right  hilar lymphadenopathy as above described likely metastatic. 3. Small right pleural effusion. 4. Centrilobular and paraseptal emphysema. 5. Left main and three-vessel coronary arteriosclerosis and aortic atherosclerosis. Aortic Atherosclerosis (ICD10-I70.0) and Emphysema (ICD10-J43.9). Electronically Signed   By: Ashley Royalty M.D.   On: 07/27/2018 23:29   Mr Jeri Cos TX Contrast  Result Date: 08/09/2018 CLINICAL DATA:  Seizure.  Lung cancer. EXAM: MRI HEAD WITHOUT AND WITH CONTRAST TECHNIQUE: Multiplanar, multiecho pulse sequences of the brain and surrounding structures were obtained without and with intravenous contrast. CONTRAST:  8 mL Gadavist COMPARISON:  Head CT 08/09/2018 FINDINGS: The study is degraded by motion, despite efforts to reduce this artifact, including utilization of motion-resistant MR sequences. The findings of the study are interpreted in the context of reduced sensitivity/specificity. BRAIN: There is no acute infarct, acute hemorrhage, hydrocephalus or extra-axial collection. The midline structures are normal. No midline shift or other mass effect. 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 Multifocal white matter hyperintensity, most commonly due to chronic ischemic microangiopathy. Generalized atrophy without lobar predilection. Susceptibility-sensitive sequences show no chronic microhemorrhage or superficial siderosis. No abnormal contrast enhancement. VASCULAR: Major intracranial arterial and venous sinus flow voids are normal. SKULL AND UPPER CERVICAL SPINE: Calvarial bone marrow signal is normal. There is no skull base mass. Visualized upper cervical spine and soft tissues are normal. SINUSES/ORBITS: There are bilateral maxillary sinus fluid levels and a left mastoid effusion. The orbits are normal. IMPRESSION: 1. No acute intracranial abnormality. 2. No intracranial metastatic disease. 3. Bilateral maxillary sinus fluid levels and left mastoid  effusion. Correlate for  symptoms of acute sinusitis. 4. Generalized atrophy and chronic ischemic microangiopathy. Electronically Signed   By: Ulyses Jarred M.D.   On: 08/09/2018 23:14   Dg Chest Port 1 View  Result Date: 08/15/2018 CLINICAL DATA:  Chest pain and cough tonight. EXAM: PORTABLE CHEST 1 VIEW COMPARISON:  08/14/2018 FINDINGS: Postoperative changes in the mediastinum. Cardiac enlargement. No vascular congestion. Right upper lung collapse with prominence of the right hilum suggesting obstructing hilar mass. Blunting of the left costophrenic angle suggesting a small effusion. No pneumothorax identified. Calcification of the aorta. Degenerative changes in the spine and shoulders. IMPRESSION: Right hilar mass with collapse of the right upper lung. Small left pleural effusion. Electronically Signed   By: Lucienne Capers M.D.   On: 08/15/2018 04:09   Dg Chest Port 1 View  Result Date: 08/10/2018 CLINICAL DATA:  Shortness of breath. EXAM: PORTABLE CHEST 1 VIEW COMPARISON:  Radiographs of August 09, 2018. FINDINGS: Stable cardiomediastinal silhouette. Status post coronary artery bypass graft. Stable right upper lobe atelectasis is noted with right hilar prominence concerning for neoplasm. No pneumothorax is noted. Left lung is clear. Right basilar atelectasis or infiltrate is noted with probable associated pleural effusion. Bony thorax is unremarkable. IMPRESSION: Stable right upper lobe atelectasis is noted with right hilar prominence concerning for neoplasm. Stable right basilar atelectasis or infiltrate is noted with associated pleural effusion. Electronically Signed   By: Marijo Conception, M.D.   On: 08/10/2018 11:38   Dg Chest Port 1 View  Result Date: 08/09/2018 CLINICAL DATA:  Right lung cancer. Postoperative for CABG by 1 week. EXAM: PORTABLE CHEST 1 VIEW COMPARISON:  08/08/2018 FINDINGS: There complete atelectasis of the right upper lobe with right hilar prominence favoring tumor. Atrial appendage clip noted.   Prior CABG. Atherosclerotic calcification of the aortic arch. Right basilar atelectasis with some resulting irregularity of the right hemidiaphragm. No overt cardiomegaly.  Thoracic spondylosis. IMPRESSION: 1. Continued complete atelectasis of the right upper lobe. Mildly increased atelectasis along the right hemidiaphragm. 2. Recent CABG without edema. 3. Atrial appendage clip. 4.  Aortic Atherosclerosis (ICD10-I70.0). Electronically Signed   By: Van Clines M.D.   On: 08/09/2018 01:19   Dg Chest Port 1 View  Result Date: 08/03/2018 CLINICAL DATA:  Follow-up chest tube on the left EXAM: PORTABLE CHEST 1 VIEW COMPARISON:  08/02/2018 FINDINGS: Cardiac shadow is enlarged but stable. Postsurgical changes are again seen. The Swan-Ganz catheter and mediastinal drain have been removed in the interval. Left thoracostomy catheter remains. No definitive pneumothorax is seen. Increasing density is noted in the right upper lobe which may be related to some atelectasis and volume loss. No bony abnormality is seen IMPRESSION: Slight increase in the degree of right upper lobe atelectasis. No pneumothorax is noted on the left. Electronically Signed   By: Inez Catalina M.D.   On: 08/03/2018 08:37   Dg Chest Port 1 View  Result Date: 08/02/2018 CLINICAL DATA:  Chest tube present, follow-up EXAM: PORTABLE CHEST 1 VIEW COMPARISON:  Chest x-ray of 08/01/2017 FINDINGS: The endotracheal tube and NG tube have been removed. A left chest tube remains and no pneumothorax is noted. There is mild basilar volume loss. Right Swan-Ganz catheter is coiled in main pulmonary artery. Cardiomegaly is stable. There may be very mild pulmonary vascular congestion present and small effusions cannot be excluded. Median sternotomy sutures are present IMPRESSION: 1. Endotracheal tube and NG tube removed. 2. Little change in aeration with mild basilar volume loss and possible small  effusions. 3. Left chest tube remains with no pneumothorax. 4.  Little change in cardiomegaly and possible minimal pulmonary vascular congestion. Electronically Signed   By: Ivar Drape M.D.   On: 08/02/2018 08:15   Dg Chest Port 1 View  Result Date: 08/01/2018 CLINICAL DATA:  Status post CABG EXAM: PORTABLE CHEST 1 VIEW COMPARISON:  07/30/2018 FINDINGS: ET tube tip is above the carina. Enteric tube tip is below the field of view. There is an IJ catheter with tip looped in the right ventricular outflow tract. Mediastinal drain and left chest tubes identified. No pneumothorax identified. Mild edema identified within the right base. No airspace densities identified. Right perihilar and right paratracheal lung mass again noted. IMPRESSION: 1. Postoperative changes from CABG procedure. No complications identified. 2. Right perihilar and right paratracheal lung mass as before. Electronically Signed   By: Kerby Moors M.D.   On: 08/01/2018 14:25   Dg Chest Port 1 View  Result Date: 07/30/2018 CLINICAL DATA:  Chest pain EXAM: PORTABLE CHEST 1 VIEW COMPARISON:  07/26/2018 FINDINGS: Mild cardiomegaly. Normal vascularity. Right paratracheal mass is stable. No pneumothorax or pleural effusion. IMPRESSION: Stable right paratracheal mass. Stable cardiomegaly. Electronically Signed   By: Marybelle Killings M.D.   On: 07/30/2018 09:28   US Thoracentesis Asp Pleural Space W/img Guide  Result Date: 08/14/2018 INDICATION: New onset dyspnea s/p CABG, new diagnosis of small cell lung cancer. History of CHF, CKD, ischemic cardiomyopathy. Bilateral pleural effusions on recent imaging with right greater than left. Request for diagnostic and therapeutic thoracentesis today. EXAM: ULTRASOUND GUIDED RIGHT THORACENTESIS MEDICATIONS: 8 mL 1% lidocaine COMPLICATIONS: None immediate. PROCEDURE: An ultrasound guided thoracentesis was thoroughly discussed with the patient and questions answered. The benefits, risks, alternatives and complications were also discussed. The patient understands and wishes  to proceed with the procedure. Written consent was obtained. Ultrasound was performed to localize and mark an adequate pocket of fluid in the right chest. The area was then prepped and draped in the normal sterile fashion. 1% Lidocaine was used for local anesthesia. Under ultrasound guidance a 6 Fr Safe-T-Centesis catheter was introduced. Thoracentesis was performed. The catheter was removed and a dressing applied. FINDINGS: A total of approximately 1.2 L of bloody fluid was removed. Samples were sent to the laboratory as requested by the clinical team. IMPRESSION: Successful ultrasound guided right thoracentesis yielding 1.2 L of pleural fluid. Read by Candiss Norse, PA-C Electronically Signed   By: Markus Daft M.D.   On: 08/14/2018 14:23      LAB RESULTS: Basic Metabolic Panel: Recent Labs  Lab 08/13/18 1132  08/14/18 0425 08/15/18 0438  NA  --   --  128* 128*  K  --    < > 5.0 4.7  CL  --   --  90* 88*  CO2  --   --  28 30  GLUCOSE  --   --  191* 141*  BUN  --   --  16 18  CREATININE  --   --  1.54* 1.48*  CALCIUM  --   --  8.7* 9.0  MG 1.9  --   --   --   PHOS 3.6  --   --   --    < > = values in this interval not displayed.   Liver Function Tests: Recent Labs  Lab 08/09/18 0255 08/13/18 0155  AST 21 23  ALT 29 26  ALKPHOS 52 65  BILITOT 0.8 0.6  PROT 5.2* 5.8*  ALBUMIN 2.7*  2.9*   No results for input(s): LIPASE, AMYLASE in the last 168 hours. Recent Labs  Lab 08/09/18 1218  AMMONIA 17   CBC: Recent Labs  Lab 08/13/18 0155 08/14/18 0425 08/15/18 0438  WBC 13.0* 9.3 13.8*  NEUTROABS 10.4*  --   --   HGB 9.0* 8.7* 9.4*  HCT 30.1* 27.2* 30.0*  MCV 93.5 89.5 89.8  PLT 471* 399 469*   Cardiac Enzymes: Recent Labs  Lab 08/09/18 1218 08/13/18 0155  TROPONINI 0.37* 0.32*   BNP: Invalid input(s): POCBNP CBG: Recent Labs  Lab 08/10/18 0643 08/10/18 1139  GLUCAP 100* 134*      Disposition and Follow-up: Discharge Instructions    Diet general    Complete by:  As directed    Discharge instructions   Complete by:  As directed    Please check your BP daily. If running in low 90's-100's, you can cut back on lasix to 40mg  once a day. You will need labs on 10/17 BMET to check your potassium and renal function.   Increase activity slowly   Complete by:  As directed        DISPOSITION: home    [place], Sharon Mt, MD. Schedule an appointment as soon as possible for a visit in 2 week(s).   Specialty:  Family Medicine Contact information: [place] [address] [phone]        Burnell Blanks, MD. Schedule an appointment as soon as possible for a visit in 2 week(s).   Specialty:  Cardiology Contact information: [place] [address] [place] [address] [phone]        Curt Bears, MD On 08/18/2018.   Specialty:  Oncology Why:  please have labs checked BMET Contact information: [place] [address] [phone]            Time coordinating discharge:  35 minutes Signed:   Estill Cotta M.D. [facility] 08/15/2018, 10:40 AM Pager: [phone]

## 2018-08-16 ENCOUNTER — Other Ambulatory Visit: Payer: Self-pay

## 2018-08-16 ENCOUNTER — Telehealth: Payer: Self-pay | Admitting: Cardiothoracic Surgery

## 2018-08-16 NOTE — Patient Outreach (Signed)
[facility]) Care Management   08/16/2018  Gregory Bates 1941-08-23 [id number]  Gregory Bates is an 77 y.o. male  Subjective: Arrived for home visit, Yolanda Bonine and wife present: Recent 3 vessels CABG.  Newly diagnosed lung cancer.   Patient reports that he just got home from hospital yesterday. Reports increased shortness of breath over the weekend. Patient reports that he had the fluid drawn off and now is breathing much better. Patient states that he has the rest of his cancer work on in Thursday and treatment plan determined on 08/18/2018.  Patient reports that his grandson and wife will be going with him. Patient is newly on oxygen and walker. Patient reports right sided chest pain that comes and goes.   Objective:  Awake and alert. Sitting in recliner. Ambulates well with walker.  Today's Vitals   08/16/18 0959 08/16/18 1005  BP: (!) 120/52   Pulse: 67   Resp: 18   SpO2: 98%   Weight: 185 lb (83.9 kg)   Height: 1.803 m (5\' 11" )   PainSc:  0-No pain   Review of Systems  Constitutional: Positive for malaise/fatigue.  HENT: Positive for hearing loss.   Eyes:       Wears reading glasses  Respiratory: Positive for cough, shortness of breath and wheezing.   Cardiovascular: Positive for chest pain.       Right sides chest pain that comes and goes  Gastrointestinal: Positive for nausea.  Genitourinary: Negative.   Musculoskeletal: Negative.   Skin: Negative.   Neurological:       Body stiffness after coughing occasionally  Endo/Heme/Allergies: Bruises/bleeds easily.  Psychiatric/Behavioral: Negative.     Physical Exam  Constitutional: He is oriented to person, place, and time. He appears well-developed and well-nourished.  Cardiovascular: Normal rate, normal heart sounds and intact distal pulses.  Respiratory: Effort normal and breath sounds normal.  GI: Soft. Bowel sounds are normal.  Musculoskeletal: Normal range of motion. He exhibits edema.  trace   Neurological: He is alert and oriented to person, place, and time.  Skin: Skin is warm and dry. There is pallor.  Psychiatric: He has a normal mood and affect. His behavior is normal. Judgment and thought content normal.    Encounter Medications:   Outpatient Encounter Medications as of 08/16/2018  Medication Sig Note  . acetaminophen (TYLENOL) 325 MG tablet Take 2 tablets (650 mg total) by mouth every 6 (six) hours as needed for mild pain.   [name] Kitchen albuterol (PROVENTIL HFA;VENTOLIN HFA) 108 (90 Base) MCG/ACT inhaler Inhale 2 puffs into the lungs every 6 (six) hours as needed for wheezing or shortness of breath.   [name] Kitchen amiodarone (PACERONE) 200 MG tablet Take 1 tablet (200 mg total) by mouth 2 (two) times daily. For one week;then take 200 mg daily thereafter (Patient taking differently: Take 200 mg by mouth See admin instructions. Take 200mg  by mouth twice daily for one week;then take 200 mg daily thereafter) 08/13/2018: Still has 3 days of the 1st week  . amoxicillin-clavulanate (AUGMENTIN) 875-125 MG tablet Take 1 tablet by mouth 2 (two) times daily for 7 days.   [name] Kitchen aspirin EC 81 MG EC tablet Take 1 tablet (81 mg total) by mouth daily.   [name] Kitchen atorvastatin (LIPITOR) 20 MG tablet Take 1 tablet (20 mg total) by mouth daily.   . cholecalciferol (VITAMIN D) 1000 UNITS tablet Take 1,000 Units by mouth every evening.    . cyclobenzaprine (FLEXERIL) 5 MG tablet Take 1 tablet (5 mg total)  by mouth 2 (two) times daily as needed for muscle spasms (cramping).   [name] Kitchen ELIQUIS 5 MG TABS tablet TAKE 1 TABLET BY MOUTH TWICE DAILY (Patient taking differently: Take 5 mg by mouth 2 (two) times daily. )   . fexofenadine (ALLEGRA) 180 MG tablet Take 180 mg by mouth daily as needed for allergies.    . fluticasone (FLONASE) 50 MCG/ACT nasal spray Place 1 spray into both nostrils daily as needed for allergies.  08/09/2018: LF 9.12.19 DS 90  . furosemide (LASIX) 40 MG tablet Take 1 tablet (40 mg total) by mouth 2 (two) times daily.    [name] Kitchen guaiFENesin (MUCINEX) 600 MG 12 hr tablet Take 1 tablet (600 mg total) by mouth 2 (two) times daily as needed for cough or to loosen phlegm.   . metoprolol tartrate (LOPRESSOR) 25 MG tablet Take 1 tablet (25 mg total) by mouth 2 (two) times daily.   . Multiple Vitamin (MULTIVITAMIN) tablet Take 1 tablet by mouth at bedtime.    . ondansetron (ZOFRAN ODT) 4 MG disintegrating tablet Take 1 tablet (4 mg total) by mouth every 8 (eight) hours as needed for nausea or vomiting.   . traMADol (ULTRAM) 50 MG tablet Take 1-2 tablets (50-100 mg total) by mouth every 6 (six) hours as needed for moderate pain or severe pain. Take 50 mg by mouth every 6-8 hours PRN for moderate pain. You can take 2 tabs for severe pain.   . vitamin E 100 UNIT capsule Take 100 Units by mouth at bedtime.     No facility-administered encounter medications on file as of 08/16/2018.     Functional Status:   In your present state of health, do you have any difficulty performing the following activities: 08/16/2018 08/13/2018  Hearing? N N  Vision? N N  Difficulty concentrating or making decisions? N N  Walking or climbing stairs? Y N  Dressing or bathing? N N  Doing errands, shopping? Y N  Preparing Food and eating ? N -  Using the Toilet? N -  In the past six months, have you accidently leaked urine? N -  Do you have problems with loss of bowel control? N -  Managing your Medications? N -  Managing your Finances? N -  Housekeeping or managing your Housekeeping? N -  Some recent data might be hidden    Fall/Depression Screening:    Fall Risk  08/16/2018 08/30/2013  Falls in the past year? Yes No  Number falls in past yr: 1 -  Injury with Fall? Yes -  Risk Factor Category  High Fall Risk -  Follow up Falls evaluation completed -   PHQ 2/9 Scores 08/16/2018 08/30/2013  PHQ - 2 Score 0 0    Assessment:   (1) reviewed [facility] program. Provided new patient packet. Reviewed consent and patient wanted to update consent  to add grandson.  Consent revised.  Reviewed 24 hour nurse line. (2) newly diagnosed heart failure and swelling, weighing but not daily and not recording.  Not follow low salt diet due to hyponatremia.  (3) newly diagnosed lung cancer. Stage pending. (4) recent CABG, chest incision well healed.  (5) multiple readmission for increased shortness of breath.  (6) no pending primary MD appointments,  Pending specialist appointments at the cancer center. (7) has already completed advanced directives.   Plan:  (1) reviewed follow up phone call in 1 week. Encouraged patient to call sooner if needed. Consent scanned into medical record.  (2) reviewed heart failure zones.  Reviewed low salt diet, reviewed importance of daily weights and recording of weights.  Reviewed pending labs needed per discharge note. (3) reviewed pending appointments for staging and plan of care. Confirmed transportation. (4) reviewed chest precautions. (5) reviewed with patient to call MD for changes in condition. Reviewed avoiding sick contacts. (6) reviewed importance of primary MD follow up. Patient has declined primary MD follow up and chooses to only follow up with specialist.  (7) encouraged patient to provide copy of advanced directives copy to MD.   Care planning and goal setting with patient. Primary goal is to avoid readmission if possible. This note and barrier letter sent to MD.  Next telephone follow up planned for 1 week.   THN CM Care Plan Problem One     Most Recent Value  Care Plan Problem One  Recent heart surgery and newly diagnosis of lung cancer.   Role Documenting the Problem One  Care Management [place] for Problem One  Active  THN Long Term Goal   Patient will report no readmissions to the hospital in the next 60 days.   THN Long Term Goal Start Date  08/16/18  Interventions for Problem One Long Term Goal  Home visit completed. Reviewed discharge instructions. reviewed pending MD  appointments. reviewed 24 hour nurse line.  THN CM Short Term Goal #1   Patient will be able to verbalize understanding of plan of care for cancer treatment  in the next 7 days.   THN CM Short Term Goal #1 Start Date  08/16/18  Interventions for Short Term Goal #1  Reviewed pending appointments. Encouraged patient to make list of questions to take with him prior to appointments.   THN CM Short Term Goal #2   Patient will weigh daily for the next 30 days.   THN CM Short Term Goal #2 Start Date  08/16/18  Interventions for Short Term Goal #2  Reviewed low salt diet and importance of daily weights. Reviewed heart failure zones and when to call MD for weight gain.       Tomasa Rand, RN, BSN, CEN [facility] [employer] [phone]

## 2018-08-17 ENCOUNTER — Other Ambulatory Visit: Payer: Self-pay | Admitting: Radiation Oncology

## 2018-08-17 ENCOUNTER — Telehealth: Payer: Self-pay | Admitting: *Deleted

## 2018-08-17 DIAGNOSIS — C3490 Malignant neoplasm of unspecified part of unspecified bronchus or lung: Secondary | ICD-10-CM

## 2018-08-17 LAB — CULTURE, RESPIRATORY W GRAM STAIN

## 2018-08-17 LAB — CULTURE, RESPIRATORY: Culture: NORMAL

## 2018-08-17 NOTE — Telephone Encounter (Signed)
Oncology Nurse Navigator Documentation  Oncology Nurse Navigator Flowsheets 08/17/2018  Navigator Location [facility]  Navigator Encounter Type Telephone;Other/I received a message from Shona Simpson patient needs PET scheduled.  Can was authorized and I called scheduling and was given an appt. I called nephew and updated him on appt for PET and pre-procedure instruction nothing by mouth 6 hours prior not even a ment or gum. He verbalized understanding of appt.   I did follow up with Bryson Ha to see if patient needed to come to sim appt tomorrow.  Telephone Outgoing Call  Treatment Phase Pre-Tx/Tx Discussion  Barriers/Navigation Needs Education;Coordination of Care  Education Other  Interventions Coordination of Care;Education  Coordination of Care Other  Education Method Verbal  Acuity Level 3  Time Spent with Patient 23

## 2018-08-17 NOTE — Telephone Encounter (Signed)
Called patient to inform of Pet Scan for 08-24-18 - arrival time - 9:30 am @ [facility], pt.to be NPO- 6 hrs. prior to test, spoke with patient and he is aware of this test

## 2018-08-17 NOTE — Telephone Encounter (Signed)
Oncology Nurse Navigator Documentation  Oncology Nurse Navigator Flowsheets 08/17/2018  Navigator Location [facility]  Navigator Encounter Type Telephone/I received a message from rad onc that patient needs to keep his appt for tomorrow.  I called the nephew and updated. He verbalized understanding.  Telephone Outgoing Call  Treatment Phase Pre-Tx/Tx Discussion  Barriers/Navigation Needs Education  Education Other  Interventions Education  Education Method Verbal  Acuity Level 2  Time Spent with Patient 15

## 2018-08-18 ENCOUNTER — Ambulatory Visit
Admission: RE | Admit: 2018-08-18 | Discharge: 2018-08-18 | Disposition: A | Discharge status: Home / Self Care | Payer: BLUE CROSS/BLUE SHIELD | Source: Ambulatory Visit | Attending: Radiation Oncology | Admitting: Radiation Oncology

## 2018-08-18 ENCOUNTER — Encounter (HOSPITAL_COMMUNITY): Payer: BLUE CROSS/BLUE SHIELD

## 2018-08-18 ENCOUNTER — Ambulatory Visit
Admission: RE | Admit: 2018-08-18 | Discharge: 2018-08-18 | Disposition: A | Discharge status: Home / Self Care | Payer: Medicare HMO | Source: Ambulatory Visit | Attending: Radiation Oncology | Admitting: Radiation Oncology

## 2018-08-18 VITALS — BP 105/50 | HR 60 | Temp 97.6°F | Resp 18 | Ht 71.0 in

## 2018-08-18 DIAGNOSIS — C3490 Malignant neoplasm of unspecified part of unspecified bronchus or lung: Secondary | ICD-10-CM

## 2018-08-18 DIAGNOSIS — C3411 Malignant neoplasm of upper lobe, right bronchus or lung: Secondary | ICD-10-CM | POA: Diagnosis not present

## 2018-08-18 NOTE — Progress Notes (Signed)
SAFETY ISSUES:  Prior radiation? Nio  Pacemaker/ICD? No  Possible current pregnancy? No  Is the patient on methotrexate? No  BP (!) 105/50 (BP Location: Left Arm, Patient Position: Sitting)   Pulse 60   Temp 97.6 F (36.4 C) (Oral)   Resp 18   Ht 5\' 11"  (1.803 m)   SpO2 100% Comment: 2L oxygen  BMI 25.80 kg/m     Current Complaints / other details:  Patient complains of constant SOB and he wears 2 liters of oxygen at home.    Cori Razor, RN 08/18/2018,3:27 PM

## 2018-08-19 LAB — CULTURE, BODY FLUID W GRAM STAIN -BOTTLE: Culture: NO GROWTH

## 2018-08-22 ENCOUNTER — Other Ambulatory Visit: Payer: Self-pay

## 2018-08-22 NOTE — Patient Outreach (Signed)
Transition of care: Vitals:   08/22/18 1006  Weight: 170 lb (77.1 kg)    Placed call to patient who reports that he is doing well. Reports decrease in cough. Reports that he will start treatment in 1 week. Reports no swelling. States appetite is good.   Patient had questions about getting a lighter weighted tank. Reviewed with patient who to call ( [employer]) and ask some questions. He was appreciative of advice.  PLAN: will contact patient again in 1 week. Encouraged patient to call MD for any changes in condition.  Tomasa Rand, RN, BSN, CEN [facility] [employer] [phone]

## 2018-08-23 NOTE — Progress Notes (Signed)
Cardiology Office Note   Date:  08/24/2018   ID:  Gregory, Bates [birth year], MRN [id number]  PCP:  Street, Sharon Mt, MD  Cardiologist: Dr. Lauree Chandler, MD   Chief Complaint  Patient presents with  . Shortness of Breath  . Coronary Artery Disease  . Lung Cancer  . Hypertension  . Post -Op Open Heart Surgery   History of Present Illness: Gregory Bates is a 77 y.o. male who presents for post CABG follow up, seen for Dr. Angelena Form.   Gregory Bates has a PMH of CAD s/p STEMI in 2013 and NSTEMI 2014 w/ PCI to RCA and OM1 and recent CABG with LIMA to LAD, SVG to OM, SVG to PDA and LA clip on 22/33/61, chronic diastolic CHF with LVEF 22-44% and G2DD, paroxysmal atrial fibrillation on Eliquis and amiodarone, HTN, HLD, DM type 2, CKD stage 3 and recent dx of lung cancer found per chest CT on 08/09/18. .   Most recently he was seen by our service in consultation during a hospital admission for chest pain on 07/26/2018.  He underwent a cardiac catheterization and subsequently TCTS was consulted for bypass revascularization performed 08/01/18. He tolerated the procedure well and was ultimately transferred off the ICU unit on 08/04/18 and discharged 08/06/18. His Eliquis was restarted on 10/03 with TCTS clearance. He was to follow with his PCP and Dr. Servando Snare for hyponatremia and arrangement for PET scan with oncology for lung cancer follow up. Unfortunately he returned to the hospital 08/09/18 with c/o of diarrhea and SOB. CXR showed progressive atelectasis of right upper lobe. PCCM was consulted. Per chart review, on presentation there was seizure-like activity in which Keppra was ordered as well as a neurology consultation. He has not had any recurrence since that hospital admission. His Eliquis for PAF was held for the possible need for thoracentesis secondary to pleural effusion. He was started on Tussinex for mod/severe cough. TCTS followed him throughout his hospitalization. He was  ulimatey discharged on 08/10/18 with home O2 and [place] services. He was seen again in the ED on 08/13/18 for respiratory distress with c/o of exertional and resting dyspnea and increasing nonproductive cough found to be fluid volume overloaded in the setting of acute on chronic CHF complicated by right upper lobe collapse which appeared to be chronic from his recently diagnosed lung cancer. He was treated with IV Lasix and admitted to IM service. He was discharged from that admission on 08/15/18 in stable condition.     Today, he seems to be surprisingly well from a cardiac perspective. He denies chest pain, LE swelling, orthopnea, dizziness, syncope, recent fever, N/V or chills. He reports that he is increasing his activity level slowly and reports some exertional SOB in the setting of lung CA, but no worse than prior. He remains on supplemental 02 and is walking with a rolling walker. He has been gaining his appetite back and reports no fluid volume overload. He denies sternal pain. He states that he had his PET scan completed last week and is to start chemotherapy and radiation next week, 08/29/18. He reports that he is doing well with things other than his lungs. He is asking to follow his labs for hyponatremia, a chronic known issue for him. I am pleased with his appearence today. He is followed by Dr. Rogue Jury for lung cancer and will need to be followed by PCP and pulmonary medicine as well.   Past Medical History:  Diagnosis Date  .  Acute renal insufficiency    a. During 11/2013 adm with atrial flutter.  . Anemia, unspecified   . Atrial fibrillation ([address])   . CAD (coronary artery disease), native coronary artery    a. s/p cath May 2011 >> DES distal RCA;  b. 01/2013 NSTEMI >> OM1 99p (2.75x12 Promus Premier DES);  c. LHC (2/15): PCI: Promus DES to dist RCA ;  d. LHC 1/16 - dLM 67, oLAD 30, small D1 tandem 34, pOM1 stent ok, mOM 50-60, dOM bifurcation 50-60 in both vessels, AV 23-70 jailed by stent,  pRCA 61 mRCA stent ok, dRCA stent ok, PLB smal 50, dPDA 60, 80; EF 35-40 >> med Rx - PCI of PDA if angina  . Chronic combined systolic and diastolic CHF (congestive heart failure) ([place]) 12/23/2015   Echo 2/17: Mild LVH, EF 45-50%, inferior akinesis, grade 2 diastolic dysfunction, mild AI, mild MR, mild LAE, PASP 44 mmHg   . Colorectal polyps 2002  . Diabetes mellitus    AODM  . Erectile dysfunction   . History of echocardiogram    a. 01/2013 Echo: EF 55%, mid-dist inflat HK, Gr1 DD, Triv AI/TR, Mild MR.;  b.  Echo (1/15): Mild LVH, EF 55%, inferolateral hypokinesis, grade 1 diastolic dysfunction, mild AI, trivial MR, moderate LAE, PASP 36 mmHg   . Hx of cardiovascular stress test    Stress myoview 08/07/13 with LVEF 44%, inferior scar, no ischemia  . Hyperlipidemia   . Hypertension   . Hyponatremia   . Ischemic cardiomyopathy    a. EF 35-40% by LHC in 1/16; b. Echo 2/17: Mild LVH, EF 45-50%, inferior akinesis, grade 2 diastolic dysfunction, mild AI, mild MR, mild LAE, PASP 44 mmHg  . Myocardial infarction ([place])   . Oxygen deficiency   . PAF (paroxysmal atrial fibrillation) ([place])    a. Dx 11/2013. Spont conv. Placed on eliquis.  . Small cell lung cancer in adult [facility]) 07/28/2018    Past Surgical History:  Procedure Laterality Date  . BIOPSY  08/01/2018   Procedure: BIOPSY OF PERICARDIAL LYMPH NODE AND HILAR LYMPH NODE;  Surgeon: Grace Isaac, MD;  Location: [place];  Service: Open Heart Surgery;;  . CLIPPING OF ATRIAL APPENDAGE Left 08/01/2018   Procedure: CLIPPING OF ATRIAL APPENDAGE;  Surgeon: Grace Isaac, MD;  Location: [facility];  Service: Open Heart Surgery;  Laterality: Left;  . COLONOSCOPY W/ POLYPECTOMY  2002   [facility]  . COLONOSCOPY W/ POLYPECTOMY  2004; 2007 negative  . CORONARY ANGIOPLASTY  12/26/2013  . CORONARY ARTERY BYPASS GRAFT N/A 08/01/2018   Procedure: CORONARY ARTERY BYPASS GRAFTING (CABG) TIMES THREE USING LEFT INTERNAL MAMMARY ARTERY TO LAD, RIGHT GREATER  SAPHENOUS VEIN GRAFT TO OM AND RCA, VEIN HARVESTED ENDOSCOPICALLY;  Surgeon: Grace Isaac, MD;  Location: [place];  Service: Open Heart Surgery;  Laterality: N/A;  . CORONARY STENT PLACEMENT  2007, 2011  . infantile paralysis facial asymmetry    . LEFT HEART CATH AND CORONARY ANGIOGRAPHY N/A 05/18/2017   Procedure: Left Heart Cath and Coronary Angiography;  Surgeon: Martinique, Peter M, MD;  Location: [place] [facility];  Service: Cardiovascular;  Laterality: N/A;  . LEFT HEART CATH AND CORONARY ANGIOGRAPHY N/A 07/27/2018   Procedure: LEFT HEART CATH AND CORONARY ANGIOGRAPHY;  Surgeon: Martinique, Peter M, MD;  Location: [place] [facility];  Service: Cardiovascular;  Laterality: N/A;  . LEFT HEART CATHETERIZATION WITH CORONARY ANGIOGRAM N/A 10/02/2012   Procedure: LEFT HEART CATHETERIZATION WITH CORONARY ANGIOGRAM;  Surgeon: [name] Salina  M Martinique, MD;  [facility]: [facility];  Service: Cardiovascular;  Laterality: N/A;  . LEFT HEART CATHETERIZATION WITH CORONARY ANGIOGRAM N/A 02/20/2013   Procedure: LEFT HEART CATHETERIZATION WITH CORONARY ANGIOGRAM;  Surgeon: Burnell Blanks, MD;  Location: [facility] CATH LAB;  Service: Cardiovascular;  Laterality: N/A;  . LEFT HEART CATHETERIZATION WITH CORONARY ANGIOGRAM N/A 12/26/2013   Procedure: LEFT HEART CATHETERIZATION WITH CORONARY ANGIOGRAM;  Surgeon: Burnell Blanks, MD;  Location: [facility] CATH LAB;  Service: Cardiovascular;  Laterality: N/A;  . LEFT HEART CATHETERIZATION WITH CORONARY ANGIOGRAM N/A 11/28/2014   Procedure: LEFT HEART CATHETERIZATION WITH CORONARY ANGIOGRAM;  Surgeon: Burnell Blanks, MD;  Location: [facility] CATH LAB;  Service: Cardiovascular;  Laterality: N/A;  . PERCUTANEOUS CORONARY STENT INTERVENTION (PCI-S)  10/02/2012   Procedure: PERCUTANEOUS CORONARY STENT INTERVENTION (PCI-S);  Surgeon: Peter M Martinique, MD;  Location: [facility] CATH LAB;  Service: Cardiovascular;;  . PERCUTANEOUS CORONARY STENT INTERVENTION (PCI-S)  02/20/2013   Procedure: PERCUTANEOUS  CORONARY STENT INTERVENTION (PCI-S);  Surgeon: Burnell Blanks, MD;  Location: [facility] CATH LAB;  Service: Cardiovascular;;  . PERCUTANEOUS CORONARY STENT INTERVENTION (PCI-S)  12/26/2013   Procedure: PERCUTANEOUS CORONARY STENT INTERVENTION (PCI-S);  Surgeon: Burnell Blanks, MD;  Location: [facility] CATH LAB;  Service: Cardiovascular;;  . TEE WITHOUT CARDIOVERSION N/A 08/01/2018   Procedure: TRANSESOPHAGEAL ECHOCARDIOGRAM (TEE);  Surgeon: Grace Isaac, MD;  Location: [place];  Service: Open Heart Surgery;  Laterality: N/A;  . WISDOM TOOTH EXTRACTION      Current Outpatient Medications  Medication Sig Dispense Refill  . acetaminophen (TYLENOL) 325 MG tablet Take 2 tablets (650 mg total) by mouth every 6 (six) hours as needed for mild pain.    [name] Kitchen albuterol (PROVENTIL HFA;VENTOLIN HFA) 108 (90 Base) MCG/ACT inhaler Inhale 2 puffs into the lungs every 6 (six) hours as needed for wheezing or shortness of breath. 1 Inhaler 2  . amiodarone (PACERONE) 200 MG tablet Take 1 tablet (200 mg total) by mouth 2 (two) times daily. For one week;then take 200 mg daily thereafter (Patient taking differently: Take 200 mg by mouth See admin instructions. Take 200mg  by mouth twice daily for one week;then take 200 mg daily thereafter) 90 tablet 3  . aspirin EC 81 MG EC tablet Take 1 tablet (81 mg total) by mouth daily.    [name] Kitchen atorvastatin (LIPITOR) 20 MG tablet Take 1 tablet (20 mg total) by mouth daily. 90 tablet 3  . cholecalciferol (VITAMIN D) 1000 UNITS tablet Take 1,000 Units by mouth every evening.     . cyclobenzaprine (FLEXERIL) 5 MG tablet Take 1 tablet (5 mg total) by mouth 2 (two) times daily as needed for muscle spasms (cramping). 30 tablet 0  . ELIQUIS 5 MG TABS tablet TAKE 1 TABLET BY MOUTH TWICE DAILY (Patient taking differently: Take 5 mg by mouth 2 (two) times daily. ) 180 tablet 3  . fexofenadine (ALLEGRA) 180 MG tablet Take 180 mg by mouth daily as needed for allergies.     . fluticasone (FLONASE)  50 MCG/ACT nasal spray Place 1 spray into both nostrils daily as needed for allergies.     . furosemide (LASIX) 40 MG tablet Take 1 tablet (40 mg total) by mouth 2 (two) times daily. 60 tablet 3  . guaiFENesin (MUCINEX) 600 MG 12 hr tablet Take 1 tablet (600 mg total) by mouth 2 (two) times daily as needed for cough or to loosen phlegm.    . metoprolol tartrate (LOPRESSOR) 25 MG tablet Take 1 tablet (25  mg total) by mouth 2 (two) times daily. 180 tablet 3  . Multiple Vitamin (MULTIVITAMIN) tablet Take 1 tablet by mouth at bedtime.     . ondansetron (ZOFRAN ODT) 4 MG disintegrating tablet Take 1 tablet (4 mg total) by mouth every 8 (eight) hours as needed for nausea or vomiting. 20 tablet 0  . traMADol (ULTRAM) 50 MG tablet Take 1-2 tablets (50-100 mg total) by mouth every 6 (six) hours as needed for moderate pain or severe pain. Take 50 mg by mouth every 6-8 hours PRN for moderate pain. You can take 2 tabs for severe pain. 30 tablet 0  . vitamin E 100 UNIT capsule Take 100 Units by mouth at bedtime.      No current facility-administered medications for this visit.     Allergies:   Brilinta [ticagrelor] and Crestor [rosuvastatin]    Social History:  The patient  reports that he quit smoking about 33 years ago. He has never used smokeless tobacco. He reports that he does not drink alcohol or use drugs.   Family History:  The patient's family history includes Breast cancer in his sister; Colon cancer in his brother; Heart attack (age of onset: 14) in his brother; Prostate cancer in his brother; Stroke in his sister.   ROS:  Please see the history of present illness.   Otherwise, review of systems are positive for none.   All other systems are reviewed and negative.    PHYSICAL EXAM: VS:  BP 110/60   Pulse 63   Ht 5\' 11"  (1.803 m)   Wt 177 lb 12.8 oz (80.6 kg)   BMI 24.80 kg/m  , BMI Body mass index is 24.8 kg/m.  General: Frail, NAD Skin: Warm, dry, intact . Mid sternal incision, free of  redness or oozing  Head: Normocephalic, atraumatic, lear, moist mucus membranes. Neck: Negative for carotid bruits. No JVD Lungs:Clear to ausculation bilaterally. No wheezes, rales, or rhonchi. Breathing is unlabored. Cardiovascular: RRR with S1 S2. No murmurs, rubs, gallops, or LV heave appreciated. Abdomen: Soft, non-tender, non-distended. No obvious abdominal masses. MSK: Strength and tone appear normal for age. 5/5 in all extremities Extremities: No edema. No clubbing or cyanosis. DP/PT pulses 2+ bilaterally Neuro: Alert and oriented. No focal deficits. No facial asymmetry. MAE spontaneously. Psych: Responds to questions appropriately with normal affect.     EKG:  EKG is ordered today. The ekg ordered today demonstrates NSR with non-specific T wave abnormalities, similar to prior tracings.    Recent Labs: 08/02/2018: TSH 0.532 08/13/2018: ALT 26; B Natriuretic Peptide 756.6; Magnesium 1.9 08/15/2018: BUN 18; Creatinine, Ser 1.48; Hemoglobin 9.4; Platelets 469; Potassium 4.7; Sodium 128    Lipid Panel    Component Value Date/Time   CHOL 170 11/04/2013 0617   TRIG 166 (H) 11/04/2013 0617   HDL 34 (L) 11/04/2013 0617   CHOLHDL 5.0 11/04/2013 0617   VLDL 33 11/04/2013 0617   LDLCALC 103 (H) 11/04/2013 0617   LDLDIRECT 69.3 11/26/2008 1256     Wt Readings from Last 3 Encounters:  08/24/18 177 lb 12.8 oz (80.6 kg)  08/22/18 170 lb (77.1 kg)  08/16/18 185 lb (83.9 kg)     Other studies Reviewed: Additional studies/ records that were reviewed today include:   Cardiac catheterization 07/27/2018:  Mid LM to Dist LM lesion is 80% stenosed.  Dist LM to Ost LAD lesion is 90% stenosed.  Ost 1st Diag to 1st Diag lesion is 80% stenosed.  Previously placed Ost Ramus to Ramus  stent (unknown type) is widely patent.  Ramus lesion is 80% stenosed.  Ost Cx to Prox Cx lesion is 99% stenosed.  Previously placed Prox RCA to Mid RCA stent (unknown type) is widely patent.  Previously  placed Dist RCA stent (unknown type) is widely patent.  Prox RCA lesion is 70% stenosed.  RPDA lesion is 90% stenosed.  LV end diastolic pressure is normal.   1. Severe left main and 3 vessel obstructive CAD. The left main and ostial LAD have progressed significantly from prior procedure 2. Normal LVEDP  Plan: will consult CT surgery for CABG. Plavix will be held. Resume IV heparin. Will check Echo for LV function  Echocardiogram 07/28/2018: Study Conclusions  - Left ventricle: The cavity size was normal. There was moderate   concentric hypertrophy. Systolic function was normal. The   estimated ejection fraction was in the range of 55% to 60%. Wall   motion was normal; there were no regional wall motion   abnormalities. Features are consistent with a pseudonormal left   ventricular filling pattern, with concomitant abnormal relaxation   and increased filling pressure (grade 2 diastolic dysfunction).   Doppler parameters are consistent with elevated ventricular   end-diastolic filling pressure. - Aortic valve: Trileaflet; moderately thickened, moderately   calcified leaflets. There was mild regurgitation. - Aortic root: The aortic root was normal in size. - Mitral valve: There was mild regurgitation. - Left atrium: The atrium was mildly dilated. - Right ventricle: The cavity size was normal. Wall thickness was   normal. Systolic function was normal. - Tricuspid valve: There was mild regurgitation. - Pulmonic valve: There was no regurgitation. - Pulmonary arteries: Systolic pressure was mildly increased. PA   peak pressure: 31 mm Hg (S). - Inferior vena cava: The vessel was normal in size. - Pericardium, extracardiac: There was no pericardial effusion.  Impressions:  - There is hypokinesis of the basal inferior and inefrolateral   walls but other walls are hyperdynamic, overall LVEF 55-60%. This   is improved from the prior study on 12/23/2015 (45-50%)   ASSESSMENT AND  PLAN:  1. Follow up CAD s/p CABG (s/p STEMI in 2013 and NSTEMI 2014 w/ PCI to RCA and OM1): -LIMA to LAD, SVG to OM, SVG to PDA and LA clip performed on 08/01/18>>pt tolerated well and was discharged 08/06/18. Unfortunately he returned three days later on 08/09/18 with c/o of diarrhea and SOB. CXR showed progressive atelectasis of right upper lobe he was admitted and followed by IM, PCCM and neurology for seizure-like activity on presentation. He has not had any recurrence since that hospital admission. Recurrent admission thereafter for fluid volume overload in which he was treated diuretics and discharged on 08/13/18 in stable condition.  -No chest pain or anginal symptoms today>>site unremarkable  -Review sternal precautions>>minmal pain>>>most concern is with recent lung cancer dx and upcoming chemotherapy and radiation tx -EKG with NSr and rate control  -Continue ASA, statin, metoprolol>>no med changes -To follow with TCTS on 09/05/18 -Will see him back in 6 weeks for close follow up given new CABG and upcoming chemo/radiation treatment   2. Paroxysmal atrial fibrillation: -EKG today with NSR>>reports no symptoms related to AF -Continue amiodarone, Eliquis and metoprolol  -Will need to discuss long term treatment with Amiodarone given lung disease once more stable from surgery and chemo/rad therapy   3. New dx small cell lung cancer 08/2018: -Pt recently diagnosed with small cell lung cancer, currently followed by oncology -Undergoing chemotherapy and radiation starting next week,  08/29/18 -Referred to pulmonary medicine for close lung CA follow up -Continues on supplemental O2>>asking about O2 concentrator for smaller tank and easier transport -Contacted HH agency and will order for now and have pulmonology follow once established   4. Chronic diastolic CHF: -LVEF 13-24% and G2DD, appears to be euvolemic on exam today  -Continue Lasix 40mg  daily -BMET today   5. HTN: -Stable,  110/60 -No adjustments today  -Follow closely   6. HLD: -Continue statin   7. CKD Stage III: -Creatinine, 1.48 on 08/15/18 -BMET today   8. Hyponatremia: -Last Na+, 128 on 08/15/18 -Asymptomatic -Will follow with BMET today  -Discussed fluid volume management  -Likely in the setting of recent lung cancer dx    Current medicines are reviewed at length with the patient today.  The patient does not have concerns regarding medicines.  The following changes have been made:  no change  Labs/ tests ordered today include: BMET, CBC today   Orders Placed This Encounter  Procedures  . Basic Metabolic Panel (BMET)  . CBC  . Ambulatory referral to Pulmonology  . EKG 12-Lead    Disposition:  FU with APP or Dr. Angelena Form in 6 weeks or sooner if needed   Signed, Kathyrn Drown, NP  08/24/2018 12:43 PM    [address], [place], [place]  [address] Phone: [phone]; Fax: [phone]

## 2018-08-24 ENCOUNTER — Encounter: Payer: Self-pay | Admitting: Cardiology

## 2018-08-24 ENCOUNTER — Ambulatory Visit (INDEPENDENT_AMBULATORY_CARE_PROVIDER_SITE_OTHER): Payer: BLUE CROSS/BLUE SHIELD | Admitting: Cardiology

## 2018-08-24 ENCOUNTER — Encounter (HOSPITAL_COMMUNITY): Payer: Medicare HMO

## 2018-08-24 VITALS — BP 110/60 | HR 63 | Ht 71.0 in | Wt 177.8 lb

## 2018-08-24 DIAGNOSIS — I10 Essential (primary) hypertension: Secondary | ICD-10-CM | POA: Diagnosis not present

## 2018-08-24 DIAGNOSIS — C3490 Malignant neoplasm of unspecified part of unspecified bronchus or lung: Secondary | ICD-10-CM

## 2018-08-24 DIAGNOSIS — R0600 Dyspnea, unspecified: Secondary | ICD-10-CM

## 2018-08-24 DIAGNOSIS — E785 Hyperlipidemia, unspecified: Secondary | ICD-10-CM | POA: Diagnosis not present

## 2018-08-24 DIAGNOSIS — I25119 Atherosclerotic heart disease of native coronary artery with unspecified angina pectoris: Secondary | ICD-10-CM

## 2018-08-24 NOTE — Patient Instructions (Signed)
Medication Instructions:  Your physician recommends that you continue on your current medications as directed. Please refer to the Current Medication list given to you today.  If you need a refill on your cardiac medications before your next appointment, please call your pharmacy.   Lab work: TODAY:  BMET & CBC  If you have labs (blood work) drawn today and your tests are completely normal, you will receive your results only by: [name] Kitchen MyChart Message (if you have MyChart) OR . A paper copy in the mail If you have any lab test that is abnormal or we need to change your treatment, we will call you to review the results.  Testing/Procedures: None ordered   You have been referred to [facility].  They will call you with an appt.   Follow Up: Your physician recommends that you schedule a follow-up appointment in: [place]    Any Other Special Instructions Will Be Listed Below (If Applicable).

## 2018-08-25 ENCOUNTER — Telehealth: Payer: Self-pay | Admitting: *Deleted

## 2018-08-25 LAB — BASIC METABOLIC PANEL
BUN/Creatinine Ratio: 12 (ref 10–24)
BUN: 19 mg/dL (ref 8–27)
CO2: 26 mmol/L (ref 20–29)
Calcium: 9.5 mg/dL (ref 8.6–10.2)
Chloride: 86 mmol/L — ABNORMAL LOW (ref 96–106)
Creatinine, Ser: 1.58 mg/dL — ABNORMAL HIGH (ref 0.76–1.27)
GFR calc Af Amer: 48 mL/min/{1.73_m2} — ABNORMAL LOW (ref >59)
GFR calc non Af Amer: 42 mL/min/{1.73_m2} — ABNORMAL LOW (ref >59)
GLUCOSE: 132 mg/dL — AB (ref 65–99)
POTASSIUM: 4.1 mmol/L (ref 3.5–5.2)
SODIUM: 126 mmol/L — AB (ref 134–144)

## 2018-08-25 LAB — CBC
Hematocrit: 30.7 % — ABNORMAL LOW (ref 37.5–51.0)
Hemoglobin: 10.6 g/dL — ABNORMAL LOW (ref 13.0–17.7)
MCH: 28.8 pg (ref 26.6–33.0)
MCHC: 34.5 g/dL (ref 31.5–35.7)
MCV: 83 fL (ref 79–97)
Platelets: 300 10*3/uL (ref 150–450)
RBC: 3.68 x10E6/uL — AB (ref 4.14–5.80)
RDW: 13 % (ref 12.3–15.4)
WBC: 9.6 10*3/uL (ref 3.4–10.8)

## 2018-08-25 NOTE — Telephone Encounter (Signed)
CALLED PATIENT TO INFORM OF PET SCAN FOR 09-02-18 - ARRIVAL TIME - 7:30 AM @ [facility], PT. TO BE NPO- 6 HRS. PRIOR TO TEST, LVM FOR A RETURN CALL PER PATIENT'S WIFE- BETTY INSTRUCTIONS

## 2018-08-26 ENCOUNTER — Inpatient Hospital Stay: Payer: BLUE CROSS/BLUE SHIELD

## 2018-08-29 ENCOUNTER — Encounter: Payer: Self-pay | Admitting: Internal Medicine

## 2018-08-29 ENCOUNTER — Inpatient Hospital Stay: Payer: BLUE CROSS/BLUE SHIELD

## 2018-08-29 ENCOUNTER — Telehealth: Payer: Self-pay | Admitting: Internal Medicine

## 2018-08-29 ENCOUNTER — Inpatient Hospital Stay (HOSPITAL_BASED_OUTPATIENT_CLINIC_OR_DEPARTMENT_OTHER): Payer: BLUE CROSS/BLUE SHIELD | Admitting: Internal Medicine

## 2018-08-29 ENCOUNTER — Telehealth: Payer: Self-pay | Admitting: Cardiovascular Disease

## 2018-08-29 ENCOUNTER — Ambulatory Visit: Payer: BLUE CROSS/BLUE SHIELD | Admitting: Radiation Oncology

## 2018-08-29 VITALS — BP 110/96 | HR 67 | Temp 98.5°F | Resp 17 | Ht 71.0 in | Wt 179.3 lb

## 2018-08-29 DIAGNOSIS — R5383 Other fatigue: Secondary | ICD-10-CM

## 2018-08-29 DIAGNOSIS — J90 Pleural effusion, not elsewhere classified: Secondary | ICD-10-CM | POA: Diagnosis not present

## 2018-08-29 DIAGNOSIS — I672 Cerebral atherosclerosis: Secondary | ICD-10-CM | POA: Diagnosis not present

## 2018-08-29 DIAGNOSIS — C3491 Malignant neoplasm of unspecified part of right bronchus or lung: Secondary | ICD-10-CM

## 2018-08-29 DIAGNOSIS — Z8042 Family history of malignant neoplasm of prostate: Secondary | ICD-10-CM | POA: Diagnosis not present

## 2018-08-29 DIAGNOSIS — I252 Old myocardial infarction: Secondary | ICD-10-CM | POA: Diagnosis not present

## 2018-08-29 DIAGNOSIS — Z5111 Encounter for antineoplastic chemotherapy: Secondary | ICD-10-CM | POA: Diagnosis not present

## 2018-08-29 DIAGNOSIS — C3411 Malignant neoplasm of upper lobe, right bronchus or lung: Secondary | ICD-10-CM

## 2018-08-29 DIAGNOSIS — E785 Hyperlipidemia, unspecified: Secondary | ICD-10-CM

## 2018-08-29 DIAGNOSIS — R531 Weakness: Secondary | ICD-10-CM

## 2018-08-29 DIAGNOSIS — E119 Type 2 diabetes mellitus without complications: Secondary | ICD-10-CM

## 2018-08-29 DIAGNOSIS — D649 Anemia, unspecified: Secondary | ICD-10-CM

## 2018-08-29 DIAGNOSIS — I255 Ischemic cardiomyopathy: Secondary | ICD-10-CM

## 2018-08-29 DIAGNOSIS — C3490 Malignant neoplasm of unspecified part of unspecified bronchus or lung: Secondary | ICD-10-CM

## 2018-08-29 DIAGNOSIS — R5382 Chronic fatigue, unspecified: Secondary | ICD-10-CM

## 2018-08-29 DIAGNOSIS — I10 Essential (primary) hypertension: Secondary | ICD-10-CM

## 2018-08-29 DIAGNOSIS — N2889 Other specified disorders of kidney and ureter: Secondary | ICD-10-CM

## 2018-08-29 DIAGNOSIS — Z87891 Personal history of nicotine dependence: Secondary | ICD-10-CM

## 2018-08-29 DIAGNOSIS — R590 Localized enlarged lymph nodes: Secondary | ICD-10-CM

## 2018-08-29 DIAGNOSIS — Z803 Family history of malignant neoplasm of breast: Secondary | ICD-10-CM

## 2018-08-29 DIAGNOSIS — I480 Paroxysmal atrial fibrillation: Secondary | ICD-10-CM

## 2018-08-29 DIAGNOSIS — Z79899 Other long term (current) drug therapy: Secondary | ICD-10-CM

## 2018-08-29 DIAGNOSIS — J439 Emphysema, unspecified: Secondary | ICD-10-CM | POA: Diagnosis not present

## 2018-08-29 DIAGNOSIS — Z800 Family history of malignant neoplasm of digestive organs: Secondary | ICD-10-CM

## 2018-08-29 DIAGNOSIS — I2510 Atherosclerotic heart disease of native coronary artery without angina pectoris: Secondary | ICD-10-CM

## 2018-08-29 DIAGNOSIS — I700 Atherosclerosis of aorta: Secondary | ICD-10-CM | POA: Diagnosis not present

## 2018-08-29 DIAGNOSIS — N529 Male erectile dysfunction, unspecified: Secondary | ICD-10-CM | POA: Diagnosis not present

## 2018-08-29 DIAGNOSIS — Z5112 Encounter for antineoplastic immunotherapy: Secondary | ICD-10-CM | POA: Diagnosis not present

## 2018-08-29 DIAGNOSIS — Z951 Presence of aortocoronary bypass graft: Secondary | ICD-10-CM | POA: Diagnosis not present

## 2018-08-29 LAB — CBC WITH DIFFERENTIAL (CANCER CENTER ONLY)
Abs Immature Granulocytes: 0.03 10*3/uL (ref 0.00–0.07)
BASOS PCT: 0 %
Basophils Absolute: 0 10*3/uL (ref 0.0–0.1)
EOS ABS: 0.4 10*3/uL (ref 0.0–0.5)
Eosinophils Relative: 4 %
HCT: 31.6 % — ABNORMAL LOW (ref 39.0–52.0)
HEMOGLOBIN: 10.4 g/dL — AB (ref 13.0–17.0)
Immature Granulocytes: 0 %
Lymphocytes Relative: 11 %
Lymphs Abs: 1 10*3/uL (ref 0.7–4.0)
MCH: 28.9 pg (ref 26.0–34.0)
MCHC: 32.9 g/dL (ref 30.0–36.0)
MCV: 87.8 fL (ref 80.0–100.0)
MONO ABS: 1 10*3/uL (ref 0.1–1.0)
Monocytes Relative: 11 %
NEUTROS PCT: 74 %
NRBC: 0 % (ref 0.0–0.2)
Neutro Abs: 6.9 10*3/uL (ref 1.7–7.7)
PLATELETS: 239 10*3/uL (ref 150–400)
RBC: 3.6 MIL/uL — AB (ref 4.22–5.81)
RDW: 13.8 % (ref 11.5–15.5)
WBC Count: 9.3 10*3/uL (ref 4.0–10.5)

## 2018-08-29 LAB — CMP (CANCER CENTER ONLY)
ALK PHOS: 114 U/L (ref 38–126)
ALT: 36 U/L (ref 0–44)
ANION GAP: 8 (ref 5–15)
AST: 29 U/L (ref 15–41)
Albumin: 3.4 g/dL — ABNORMAL LOW (ref 3.5–5.0)
BUN: 9 mg/dL (ref 8–23)
CALCIUM: 9.2 mg/dL (ref 8.9–10.3)
CO2: 24 mmol/L (ref 22–32)
Chloride: 93 mmol/L — ABNORMAL LOW (ref 98–111)
Creatinine: 1.32 mg/dL — ABNORMAL HIGH (ref 0.61–1.24)
GFR, EST AFRICAN AMERICAN: 58 mL/min — AB (ref >60)
GFR, Estimated: 50 mL/min — ABNORMAL LOW (ref >60)
Glucose, Bld: 131 mg/dL — ABNORMAL HIGH (ref 70–99)
POTASSIUM: 4.3 mmol/L (ref 3.5–5.1)
SODIUM: 125 mmol/L — AB (ref 135–145)
Total Bilirubin: 0.6 mg/dL (ref 0.3–1.2)
Total Protein: 6.6 g/dL (ref 6.5–8.1)

## 2018-08-29 LAB — TSH: TSH: 0.59 u[IU]/mL (ref 0.320–4.118)

## 2018-08-29 MED ORDER — PALONOSETRON HCL INJECTION 0.25 MG/5ML
0.2500 mg | Freq: Once | INTRAVENOUS | Status: AC
  Administered 2018-08-29: 0.25 mg via INTRAVENOUS

## 2018-08-29 MED ORDER — SODIUM CHLORIDE 0.9 % IV SOLN
Freq: Once | INTRAVENOUS | Status: AC
  Administered 2018-08-29: 14:00:00 via INTRAVENOUS
  Filled 2018-08-29: qty 5

## 2018-08-29 MED ORDER — SODIUM CHLORIDE 0.9 % IV SOLN
Freq: Once | INTRAVENOUS | Status: AC
  Administered 2018-08-29: 13:00:00 via INTRAVENOUS
  Filled 2018-08-29: qty 250

## 2018-08-29 MED ORDER — SODIUM CHLORIDE 0.9 % IV SOLN
100.0000 mg/m2 | Freq: Once | INTRAVENOUS | Status: AC
  Administered 2018-08-29: 200 mg via INTRAVENOUS
  Filled 2018-08-29: qty 10

## 2018-08-29 MED ORDER — PROCHLORPERAZINE MALEATE 10 MG PO TABS: 10.0000 mg | ORAL_TABLET | Freq: Four times a day (QID) | ORAL | 0 refills | Status: DC | PRN

## 2018-08-29 MED ORDER — SODIUM CHLORIDE 0.9 % IV SOLN
373.0000 mg | Freq: Once | INTRAVENOUS | Status: AC
  Administered 2018-08-29: 370 mg via INTRAVENOUS
  Filled 2018-08-29: qty 37

## 2018-08-29 MED ORDER — SODIUM CHLORIDE 0.9 % IV SOLN
1200.0000 mg | Freq: Once | INTRAVENOUS | Status: AC
  Administered 2018-08-29: 1200 mg via INTRAVENOUS
  Filled 2018-08-29: qty 20

## 2018-08-29 MED ORDER — PALONOSETRON HCL INJECTION 0.25 MG/5ML
INTRAVENOUS | Status: AC
  Filled 2018-08-29: qty 5

## 2018-08-29 NOTE — Patient Instructions (Signed)
[facility] Discharge Instructions for Patients Receiving Chemotherapy  Today you received the following chemotherapy agents Tecentriq, Carboplatin and VP  To help prevent nausea and vomiting after your treatment, we encourage you to take your nausea medication as prescribed.   If you develop nausea and vomiting that is not controlled by your nausea medication, call the clinic.   BELOW ARE SYMPTOMS THAT SHOULD BE REPORTED IMMEDIATELY:  *FEVER GREATER THAN 100.5 F  *CHILLS WITH OR WITHOUT FEVER  NAUSEA AND VOMITING THAT IS NOT CONTROLLED WITH YOUR NAUSEA MEDICATION  *UNUSUAL SHORTNESS OF BREATH  *UNUSUAL BRUISING OR BLEEDING  TENDERNESS IN MOUTH AND THROAT WITH OR WITHOUT PRESENCE OF ULCERS  *URINARY PROBLEMS  *BOWEL PROBLEMS  UNUSUAL RASH Items with * indicate a potential emergency and should be followed up as soon as possible.  Feel free to call the clinic should you have any questions or concerns. The clinic phone number is [phone].  Please show the [facility] at check-in to the Emergency Department and triage nurse.  Atezolizumab injection [name]) What is this medicine? ATEZOLIZUMAB (a te zoe LIZ ue mab) is a monoclonal antibody. It is used to treat bladder cancer (urothelial cancer) and non-small cell lung cancer. This medicine may be used for other purposes; ask your health care provider or pharmacist if you have questions. COMMON BRAND NAME(S): Tecentriq What should I tell my health care provider before I take this medicine? They need to know if you have any of these conditions: -diabetes -immune system problems -infection -inflammatory bowel disease -liver disease -lung or breathing disease -lupus -nervous system problems like myasthenia gravis or Guillain-Barre syndrome -organ transplant -an unusual or allergic reaction to atezolizumab, other medicines, foods, dyes, or preservatives -pregnant or trying to get  pregnant -breast-feeding How should I use this medicine? This medicine is for infusion into a vein. It is given by a health care professional in a hospital or clinic setting. A special MedGuide will be given to you before each treatment. Be sure to read this information carefully each time. Talk to your pediatrician regarding the use of this medicine in children. Special care may be needed. Overdosage: If you think you have taken too much of this medicine contact a poison control center or emergency room at once. NOTE: This medicine is only for you. Do not share this medicine with others. What if I miss a dose? It is important not to miss your dose. Call your doctor or health care professional if you are unable to keep an appointment. What may interact with this medicine? Interactions have not been studied. This list may not describe all possible interactions. Give your health care provider a list of all the medicines, herbs, non-prescription drugs, or dietary supplements you use. Also tell them if you smoke, drink alcohol, or use illegal drugs. Some items may interact with your medicine. What should I watch for while using this medicine? Your condition will be monitored carefully while you are receiving this medicine. You may need blood work done while you are taking this medicine. Do not become pregnant while taking this medicine or for at least 5 months after stopping it. Women should inform their doctor if they wish to become pregnant or think they might be pregnant. There is a potential for serious side effects to an unborn child. Talk to your health care professional or pharmacist for more information. Do not breast-feed an infant while taking this medicine or for at least 5 months after the  last dose. What side effects may I notice from receiving this medicine? Side effects that you should report to your doctor or health care professional as soon as possible: -allergic reactions like skin  rash, itching or hives, swelling of the face, lips, or tongue -black, tarry stools -bloody or watery diarrhea -breathing problems -changes in vision -chest pain or chest tightness -chills -facial flushing -fever -headache -signs and symptoms of high blood sugar such as dizziness; dry mouth; dry skin; fruity breath; nausea; stomach pain; increased hunger or thirst; increased urination -signs and symptoms of liver injury like dark yellow or brown urine; general ill feeling or flu-like symptoms; light-colored stools; loss of appetite; nausea; right upper belly pain; unusually weak or tired; yellowing of the eyes or skin -stomach pain -trouble passing urine or change in the amount of urine Side effects that usually do not require medical attention (report to your doctor or health care professional if they continue or are bothersome): -cough -diarrhea -joint pain -muscle pain -muscle weakness -tiredness -weight loss This list may not describe all possible side effects. Call your doctor for medical advice about side effects. You may report side effects to FDA at 1-800-FDA-1088. Where should I keep my medicine? This drug is given in a hospital or clinic and will not be stored at home. NOTE: This sheet is a summary. It may not cover all possible information. If you have questions about this medicine, talk to your doctor, pharmacist, or health care provider.  2018 Elsevier/Gold Standard (2015-11-20 17:54:14)  Carboplatin injection What is this medicine? CARBOPLATIN (KAR boe pla tin) is a chemotherapy drug. It targets fast dividing cells, like cancer cells, and causes these cells to die. This medicine is used to treat ovarian cancer and many other cancers. This medicine may be used for other purposes; ask your health care provider or pharmacist if you have questions. COMMON BRAND NAME(S): Paraplatin What should I tell my health care provider before I take this medicine? They need to know if  you have any of these conditions: -blood disorders -hearing problems -kidney disease -recent or ongoing radiation therapy -an unusual or allergic reaction to carboplatin, cisplatin, other chemotherapy, other medicines, foods, dyes, or preservatives -pregnant or trying to get pregnant -breast-feeding How should I use this medicine? This drug is usually given as an infusion into a vein. It is administered in a hospital or clinic by a specially trained health care professional. Talk to your pediatrician regarding the use of this medicine in children. Special care may be needed. Overdosage: If you think you have taken too much of this medicine contact a poison control center or emergency room at once. NOTE: This medicine is only for you. Do not share this medicine with others. What if I miss a dose? It is important not to miss a dose. Call your doctor or health care professional if you are unable to keep an appointment. What may interact with this medicine? -medicines for seizures -medicines to increase blood counts like filgrastim, pegfilgrastim, sargramostim -some antibiotics like amikacin, gentamicin, neomycin, streptomycin, tobramycin -vaccines Talk to your doctor or health care professional before taking any of these medicines: -acetaminophen -aspirin -ibuprofen -ketoprofen -naproxen This list may not describe all possible interactions. Give your health care provider a list of all the medicines, herbs, non-prescription drugs, or dietary supplements you use. Also tell them if you smoke, drink alcohol, or use illegal drugs. Some items may interact with your medicine. What should I watch for while using this medicine? Your  condition will be monitored carefully while you are receiving this medicine. You will need important blood work done while you are taking this medicine. This drug may make you feel generally unwell. This is not uncommon, as chemotherapy can affect healthy cells as well  as cancer cells. Report any side effects. Continue your course of treatment even though you feel ill unless your doctor tells you to stop. In some cases, you may be given additional medicines to help with side effects. Follow all directions for their use. Call your doctor or health care professional for advice if you get a fever, chills or sore throat, or other symptoms of a cold or flu. Do not treat yourself. This drug decreases your body's ability to fight infections. Try to avoid being around people who are sick. This medicine may increase your risk to bruise or bleed. Call your doctor or health care professional if you notice any unusual bleeding. Be careful brushing and flossing your teeth or using a toothpick because you may get an infection or bleed more easily. If you have any dental work done, tell your dentist you are receiving this medicine. Avoid taking products that contain aspirin, acetaminophen, ibuprofen, naproxen, or ketoprofen unless instructed by your doctor. These medicines may hide a fever. Do not become pregnant while taking this medicine. Women should inform their doctor if they wish to become pregnant or think they might be pregnant. There is a potential for serious side effects to an unborn child. Talk to your health care professional or pharmacist for more information. Do not breast-feed an infant while taking this medicine. What side effects may I notice from receiving this medicine? Side effects that you should report to your doctor or health care professional as soon as possible: -allergic reactions like skin rash, itching or hives, swelling of the face, lips, or tongue -signs of infection - fever or chills, cough, sore throat, pain or difficulty passing urine -signs of decreased platelets or bleeding - bruising, pinpoint red spots on the skin, black, tarry stools, nosebleeds -signs of decreased red blood cells - unusually weak or tired, fainting spells,  lightheadedness -breathing problems -changes in hearing -changes in vision -chest pain -high blood pressure -low blood counts - This drug may decrease the number of white blood cells, red blood cells and platelets. You may be at increased risk for infections and bleeding. -nausea and vomiting -pain, swelling, redness or irritation at the injection site -pain, tingling, numbness in the hands or feet -problems with balance, talking, walking -trouble passing urine or change in the amount of urine Side effects that usually do not require medical attention (report to your doctor or health care professional if they continue or are bothersome): -hair loss -loss of appetite -metallic taste in the mouth or changes in taste This list may not describe all possible side effects. Call your doctor for medical advice about side effects. You may report side effects to FDA at 1-800-FDA-1088. Where should I keep my medicine? This drug is given in a hospital or clinic and will not be stored at home. NOTE: This sheet is a summary. It may not cover all possible information. If you have questions about this medicine, talk to your doctor, pharmacist, or health care provider.  2018 Elsevier/Gold Standard (2008-01-24 14:38:05)  Etoposide, VP-16 capsules What is this medicine? ETOPOSIDE, VP-16 (e toe POE side) is a chemotherapy drug. It is used to treat small cell lung cancer and other cancers. This medicine may be used for other  purposes; ask your health care provider or pharmacist if you have questions. COMMON BRAND NAME(S): VePesid What should I tell my health care provider before I take this medicine? They need to know if you have any of these conditions: -infection -kidney disease -liver disease -low blood counts, like low white cell, platelet, or red cell counts -an unusual or allergic reaction to etoposide, other medicines, foods, dyes, or preservatives -pregnant or trying to get  pregnant -breast-feeding How should I use this medicine? Take this medicine by mouth with a glass of water. Follow the directions on the prescription label. Do not open, crush, or chew the capsules. It is advisable to wear gloves when handling this medicine. Take your medicine at regular intervals. Do not take it more often than directed. Do not stop taking except on your doctor's advice. Talk to your pediatrician regarding the use of this medicine in children. Special care may be needed. Overdosage: If you think you have taken too much of this medicine contact a poison control center or emergency room at once. NOTE: This medicine is only for you. Do not share this medicine with others. What if I miss a dose? If you miss a dose, take it as soon as you can. If it is almost time for your next dose, take only that dose. Do not take double or extra doses. What may interact with this medicine? -aspirin -certain medications for seizures like carbamazepine, phenobarbital, phenytoin, valproic acid -cyclosporine -levamisole -valproic acid -warfarin This list may not describe all possible interactions. Give your health care provider a list of all the medicines, herbs, non-prescription drugs, or dietary supplements you use. Also tell them if you smoke, drink alcohol, or use illegal drugs. Some items may interact with your medicine. What should I watch for while using this medicine? Visit your doctor for checks on your progress. This drug may make you feel generally unwell. This is not uncommon, as chemotherapy can affect healthy cells as well as cancer cells. Report any side effects. Continue your course of treatment even though you feel ill unless your doctor tells you to stop. In some cases, you may be given additional medicines to help with side effects. Follow all directions for their use. Call your doctor or health care professional for advice if you get a fever, chills or sore throat, or other  symptoms of a cold or flu. Do not treat yourself. This drug decreases your body's ability to fight infections. Try to avoid being around people who are sick. This medicine may increase your risk to bruise or bleed. Call your doctor or health care professional if you notice any unusual bleeding. Talk to your doctor about your risk of cancer. You may be more at risk for certain types of cancers if you take this medicine. Do not become pregnant while taking this medicine or for at least 6 months after stopping it. Women should inform their doctor if they wish to become pregnant or think they might be pregnant. Women of child-bearing potential will need to have a negative pregnancy test before starting this medicine. There is a potential for serious side effects to an unborn child. Talk to your health care professional or pharmacist for more information. Do not breast-feed an infant while taking this medicine. Men must use a latex condom during sexual contact with a woman while taking this medicine and for at least 4 months after stopping it. A latex condom is needed even if you have had a vasectomy. Contact your  doctor right away if your partner becomes pregnant. Do not donate sperm while taking this medicine and for 4 months after you stop taking this medicine. Men should inform their doctors if they wish to father a child. This medicine may lower sperm counts. What side effects may I notice from receiving this medicine? Side effects that you should report to your doctor or health care professional as soon as possible: -allergic reactions like skin rash, itching or hives, swelling of the face, lips, or tongue -low blood counts - this medicine may decrease the number of white blood cells, red blood cells and platelets. You may be at increased risk for infections and bleeding. -signs of infection - fever or chills, cough, sore throat, pain or difficulty passing urine -signs of decreased platelets or bleeding -  bruising, pinpoint red spots on the skin, black, tarry stools, blood in the urine -signs of decreased red blood cells - unusually weak or tired, fainting spells, lightheadedness -breathing problems -changes in vision -mouth or throat sores or ulcers -pain, tingling, numbness in the hands or feet -redness, blistering, peeling or loosening of the skin, including inside the mouth -seizures -vomiting Side effects that usually do not require medical attention (report to your doctor or health care professional if they continue or are bothersome): -change in taste -diarrhea -hair loss -nausea -stomach pain This list may not describe all possible side effects. Call your doctor for medical advice about side effects. You may report side effects to FDA at 1-800-FDA-1088. Where should I keep my medicine? Keep out of the reach of children. Store in a refrigerator between 2 and 8 degrees C (36 and 46 degrees F). Do not freeze. Throw away any unused medicine after the expiration date. NOTE: This sheet is a summary. It may not cover all possible information. If you have questions about this medicine, talk to your doctor, pharmacist, or health care provider.  2018 Elsevier/Gold Standard (2015-10-11 11:49:52)

## 2018-08-29 NOTE — Telephone Encounter (Signed)
New message   Patient's wife has questions about lab results.

## 2018-08-29 NOTE — Telephone Encounter (Signed)
Left message to call back  

## 2018-08-29 NOTE — Telephone Encounter (Signed)
Gave pt avs and calendar. Pt sched in jan due to avail of treatment area and office will be closed 1/1

## 2018-08-29 NOTE — Progress Notes (Signed)
[facility] [phone]   Fax:([phone]) [place], [place] [address]  DIAGNOSIS: Extensive stage (T1 a, N2, M1a) extensive stage small cell lung cancer presented with right lung nodules in addition to mediastinal lymphadenopathy as well as pericardial metastasis diagnosed in October 2019  PRIOR THERAPY: None.  CURRENT THERAPY: palliative systemic chemotherapy with carboplatin for AUC of 5 on day 1, etoposide 100 mg/M2 on days 1, 2 and 3 in addition to Tecentriq (Atezolizumab) 1200 mg IV every 3 weeks.  INTERVAL HISTORY: Gregory Bates 77 y.o. male returns to the clinic today for follow-up visit accompanied by his wife.  The patient is feeling fine today with no concerning complaints except for the baseline shortness of breath and he is currently on home oxygen.  He was admitted to the hospital recently with shortness of breath and he has drainage of the right pleural effusion with thoracentesis.  The final pathology was negative for malignancy.  He denied having any current chest pain, cough or hemoptysis.  He has no nausea, vomiting, diarrhea or constipation.  He has no fever or chills.  The patient is here today for evaluation before starting the first cycle of his treatment with chemotherapy.  MEDICAL HISTORY: Past Medical History:  Diagnosis Date  . Acute renal insufficiency    a. During 11/2013 adm with atrial flutter.  . Anemia, unspecified   . Atrial fibrillation ([place])   . CAD (coronary artery disease), native coronary artery    a. s/p cath May 2011 >> DES distal RCA;  b. 01/2013 NSTEMI >> OM1 99p (2.75x12 Promus Premier DES);  c. LHC (2/15): PCI: Promus DES to dist RCA ;  d. LHC 1/16 - dLM 35, oLAD 30, small D1 tandem 76, pOM1 stent ok, mOM 50-60, dOM bifurcation 50-60 in both vessels, AV 64-70 jailed by stent, pRCA 81 mRCA stent ok, dRCA stent ok, PLB smal 50, dPDA 60, 80; EF 35-40 >> med  Rx - PCI of PDA if angina  . Chronic combined systolic and diastolic CHF (congestive heart failure) ([place]) 12/23/2015   Echo 2/17: Mild LVH, EF 45-50%, inferior akinesis, grade 2 diastolic dysfunction, mild AI, mild MR, mild LAE, PASP 44 mmHg   . Colorectal polyps 2002  . Diabetes mellitus    AODM  . Erectile dysfunction   . History of echocardiogram    a. 01/2013 Echo: EF 55%, mid-dist inflat HK, Gr1 DD, Triv AI/TR, Mild MR.;  b.  Echo (1/15): Mild LVH, EF 55%, inferolateral hypokinesis, grade 1 diastolic dysfunction, mild AI, trivial MR, moderate LAE, PASP 36 mmHg   . Hx of cardiovascular stress test    Stress myoview 08/07/13 with LVEF 44%, inferior scar, no ischemia  . Hyperlipidemia   . Hypertension   . Hyponatremia   . Ischemic cardiomyopathy    a. EF 35-40% by LHC in 1/16; b. Echo 2/17: Mild LVH, EF 45-50%, inferior akinesis, grade 2 diastolic dysfunction, mild AI, mild MR, mild LAE, PASP 44 mmHg  . Myocardial infarction ([place])   . Oxygen deficiency   . PAF (paroxysmal atrial fibrillation) ([place])    a. Dx 11/2013. Spont conv. Placed on eliquis.  . Small cell lung cancer in adult [facility]) 07/28/2018    ALLERGIES:  is allergic to brilinta [ticagrelor] and crestor [rosuvastatin].  MEDICATIONS:  Current Outpatient Medications  Medication Sig Dispense Refill  . acetaminophen (TYLENOL) 325 MG tablet Take 2 tablets (  650 mg total) by mouth every 6 (six) hours as needed for mild pain.    [name] Kitchen amiodarone (PACERONE) 200 MG tablet Take 1 tablet (200 mg total) by mouth 2 (two) times daily. For one week;then take 200 mg daily thereafter (Patient taking differently: Take 200 mg by mouth See admin instructions. Take 200mg  by mouth twice daily for one week;then take 200 mg daily thereafter) 90 tablet 3  . aspirin EC 81 MG EC tablet Take 1 tablet (81 mg total) by mouth daily.    [name] Kitchen atorvastatin (LIPITOR) 20 MG tablet Take 1 tablet (20 mg total) by mouth daily. 90 tablet 3  . cholecalciferol (VITAMIN D) 1000  UNITS tablet Take 1,000 Units by mouth every evening.     [name] Kitchen ELIQUIS 5 MG TABS tablet TAKE 1 TABLET BY MOUTH TWICE DAILY (Patient taking differently: Take 5 mg by mouth 2 (two) times daily. ) 180 tablet 3  . fexofenadine (ALLEGRA) 180 MG tablet Take 180 mg by mouth daily as needed for allergies.     . fluticasone (FLONASE) 50 MCG/ACT nasal spray Place 1 spray into both nostrils daily as needed for allergies.     . furosemide (LASIX) 40 MG tablet Take 1 tablet (40 mg total) by mouth 2 (two) times daily. 60 tablet 3  . guaiFENesin (MUCINEX) 600 MG 12 hr tablet Take 1 tablet (600 mg total) by mouth 2 (two) times daily as needed for cough or to loosen phlegm.    . metoprolol tartrate (LOPRESSOR) 25 MG tablet Take 1 tablet (25 mg total) by mouth 2 (two) times daily. 180 tablet 3  . Multiple Vitamin (MULTIVITAMIN) tablet Take 1 tablet by mouth at bedtime.     . traMADol (ULTRAM) 50 MG tablet Take 1-2 tablets (50-100 mg total) by mouth every 6 (six) hours as needed for moderate pain or severe pain. Take 50 mg by mouth every 6-8 hours PRN for moderate pain. You can take 2 tabs for severe pain. 30 tablet 0  . vitamin E 100 UNIT capsule Take 100 Units by mouth at bedtime.     [name] Kitchen albuterol (PROVENTIL HFA;VENTOLIN HFA) 108 (90 Base) MCG/ACT inhaler Inhale 2 puffs into the lungs every 6 (six) hours as needed for wheezing or shortness of breath. (Patient not taking: Reported on 08/29/2018) 1 Inhaler 2  . cyclobenzaprine (FLEXERIL) 5 MG tablet Take 1 tablet (5 mg total) by mouth 2 (two) times daily as needed for muscle spasms (cramping). (Patient not taking: Reported on 08/29/2018) 30 tablet 0  . isosorbide mononitrate (IMDUR) 60 MG 24 hr tablet Take 60 mg by mouth daily.    . ondansetron (ZOFRAN ODT) 4 MG disintegrating tablet Take 1 tablet (4 mg total) by mouth every 8 (eight) hours as needed for nausea or vomiting. (Patient not taking: Reported on 08/29/2018) 20 tablet 0   No current facility-administered  medications for this visit.     SURGICAL HISTORY:  Past Surgical History:  Procedure Laterality Date  . BIOPSY  08/01/2018   Procedure: BIOPSY OF PERICARDIAL LYMPH NODE AND HILAR LYMPH NODE;  Surgeon: Grace Isaac, MD;  Location: [facility];  Service: Open Heart Surgery;;  . CLIPPING OF ATRIAL APPENDAGE Left 08/01/2018   Procedure: CLIPPING OF ATRIAL APPENDAGE;  Surgeon: Grace Isaac, MD;  Location: [facility];  Service: Open Heart Surgery;  Laterality: Left;  . COLONOSCOPY W/ POLYPECTOMY  2002   [facility]  . COLONOSCOPY W/ POLYPECTOMY  2004; 2007 negative  . CORONARY ANGIOPLASTY  12/26/2013  .  CORONARY ARTERY BYPASS GRAFT N/A 08/01/2018   Procedure: CORONARY ARTERY BYPASS GRAFTING (CABG) TIMES THREE USING LEFT INTERNAL MAMMARY ARTERY TO LAD, RIGHT GREATER SAPHENOUS VEIN GRAFT TO OM AND RCA, VEIN HARVESTED ENDOSCOPICALLY;  Surgeon: Grace Isaac, MD;  Location: [facility];  Service: Open Heart Surgery;  Laterality: N/A;  . CORONARY STENT PLACEMENT  2007, 2011  . infantile paralysis facial asymmetry    . LEFT HEART CATH AND CORONARY ANGIOGRAPHY N/A 05/18/2017   Procedure: Left Heart Cath and Coronary Angiography;  Surgeon: Martinique, Peter M, MD;  Location: [facility] CV [facility];  Service: Cardiovascular;  Laterality: N/A;  . LEFT HEART CATH AND CORONARY ANGIOGRAPHY N/A 07/27/2018   Procedure: LEFT HEART CATH AND CORONARY ANGIOGRAPHY;  Surgeon: Martinique, Peter M, MD;  Location: [place] [facility];  Service: Cardiovascular;  Laterality: N/A;  . LEFT HEART CATHETERIZATION WITH CORONARY ANGIOGRAM N/A 10/02/2012   Procedure: LEFT HEART CATHETERIZATION WITH CORONARY ANGIOGRAM;  Surgeon: Peter M Martinique, MD;  Location: [facility] CATH LAB;  Service: Cardiovascular;  Laterality: N/A;  . LEFT HEART CATHETERIZATION WITH CORONARY ANGIOGRAM N/A 02/20/2013   Procedure: LEFT HEART CATHETERIZATION WITH CORONARY ANGIOGRAM;  Surgeon: Burnell Blanks, MD;  Location: [facility] CATH LAB;  Service: Cardiovascular;  Laterality: N/A;    . LEFT HEART CATHETERIZATION WITH CORONARY ANGIOGRAM N/A 12/26/2013   Procedure: LEFT HEART CATHETERIZATION WITH CORONARY ANGIOGRAM;  Surgeon: Burnell Blanks, MD;  Location: [facility] CATH LAB;  Service: Cardiovascular;  Laterality: N/A;  . LEFT HEART CATHETERIZATION WITH CORONARY ANGIOGRAM N/A 11/28/2014   Procedure: LEFT HEART CATHETERIZATION WITH CORONARY ANGIOGRAM;  Surgeon: Burnell Blanks, MD;  Location: [facility] CATH LAB;  Service: Cardiovascular;  Laterality: N/A;  . PERCUTANEOUS CORONARY STENT INTERVENTION (PCI-S)  10/02/2012   Procedure: PERCUTANEOUS CORONARY STENT INTERVENTION (PCI-S);  Surgeon: Peter M Martinique, MD;  Location: [facility] CATH LAB;  Service: Cardiovascular;;  . PERCUTANEOUS CORONARY STENT INTERVENTION (PCI-S)  02/20/2013   Procedure: PERCUTANEOUS CORONARY STENT INTERVENTION (PCI-S);  Surgeon: Burnell Blanks, MD;  Location: [facility] CATH LAB;  Service: Cardiovascular;;  . PERCUTANEOUS CORONARY STENT INTERVENTION (PCI-S)  12/26/2013   Procedure: PERCUTANEOUS CORONARY STENT INTERVENTION (PCI-S);  Surgeon: Burnell Blanks, MD;  Location: [facility] CATH LAB;  Service: Cardiovascular;;  . TEE WITHOUT CARDIOVERSION N/A 08/01/2018   Procedure: TRANSESOPHAGEAL ECHOCARDIOGRAM (TEE);  Surgeon: Grace Isaac, MD;  Location: [place];  Service: Open Heart Surgery;  Laterality: N/A;  . WISDOM TOOTH EXTRACTION      REVIEW OF SYSTEMS:  A comprehensive review of systems was negative except for: Constitutional: positive for fatigue Respiratory: positive for dyspnea on exertion   PHYSICAL EXAMINATION: General appearance: alert, cooperative, fatigued and no distress Head: Normocephalic, without obvious abnormality, atraumatic Neck: no adenopathy, no JVD, supple, symmetrical, trachea midline and thyroid not enlarged, symmetric, no tenderness/mass/nodules Lymph nodes: Cervical, supraclavicular, and axillary nodes normal. Resp: clear to auscultation bilaterally Back: symmetric, no curvature. ROM  normal. No CVA tenderness. Cardio: regular rate and rhythm, S1, S2 normal, no murmur, click, rub or gallop GI: soft, non-tender; bowel sounds normal; no masses,  no organomegaly Extremities: extremities normal, atraumatic, no cyanosis or edema  ECOG PERFORMANCE STATUS: 1 - Symptomatic but completely ambulatory  Blood pressure (!) 110/96, pulse 67, temperature 98.5 F (36.9 C), temperature source Oral, resp. rate 17, height 5\' 11"  (1.803 m), weight 179 lb 4.8 oz (81.3 kg), SpO2 99 %.  LABORATORY DATA: Lab Results  Component Value Date   WBC 9.3 08/29/2018   HGB 10.4 (L) 08/29/2018  HCT 31.6 (L) 08/29/2018   MCV 87.8 08/29/2018   PLT 239 08/29/2018      Chemistry      Component Value Date/Time   NA 126 (L) 08/24/2018 1237   K 4.1 08/24/2018 1237   CL 86 (L) 08/24/2018 1237   CO2 26 08/24/2018 1237   BUN 19 08/24/2018 1237   CREATININE 1.58 (H) 08/24/2018 1237   CREATININE 1.67 (H) 10/30/2016 1033      Component Value Date/Time   CALCIUM 9.5 08/24/2018 1237   ALKPHOS 65 08/13/2018 0155   AST 23 08/13/2018 0155   ALT 26 08/13/2018 0155   BILITOT 0.6 08/13/2018 0155       RADIOGRAPHIC STUDIES: Dg Chest 1 View  Result Date: 08/14/2018 CLINICAL DATA:  Post thoracentesis. EXAM: CHEST  1 VIEW COMPARISON:  Chest x-ray dated 08/13/2018. Chest CT dated 08/13/2018. FINDINGS: Improved aeration at the RIGHT lung base status post thoracentesis. No pneumothorax seen. Heart size and mediastinal contours are stable. IMPRESSION: Improved aeration of the RIGHT lung status post thoracentesis. No pneumothorax seen. Electronically Signed   By: Franki Cabot M.D.   On: 08/14/2018 14:20   Dg Chest 2 View  Result Date: 08/13/2018 CLINICAL DATA:  Bypass surgery 2 weeks ago. Now shortness of breath. Diagnosed with right upper lobe lung cancer. EXAM: CHEST - 2 VIEW COMPARISON:  08/10/2018 FINDINGS: Right hilar mass with postobstructive consolidation and collapse in the right upper lung. Small  bilateral pleural effusions with basilar atelectasis. Postoperative changes in the mediastinum. Mild cardiac enlargement. No vascular congestion or edema. IMPRESSION: Right hilar mass with postobstructive consolidation and collapse in the right upper lung. Small bilateral pleural effusions with basilar atelectasis. Electronically Signed   By: Lucienne Capers M.D.   On: 08/13/2018 01:57   Dg Chest 2 View  Result Date: 08/05/2018 CLINICAL DATA:  CABG. EXAM: CHEST - 2 VIEW COMPARISON:  08/03/2018. FINDINGS: Interim removal of right IJ sheath. Prior CABG. Left atrial appendage clip noted in stable position. Stable cardiomegaly. Right upper lobe atelectatic changes, progressed from prior exam. Small bilateral pleural effusions, progressed from prior exam. No pneumothorax. IMPRESSION: 1.  Interim removal of right IJ sheath.  No pneumothorax. 2. Prior CABG. Left atrial appendage clip in stable position. Stable cardiomegaly. 3. Progressive right upper lobe atelectasis. Slight progression of bilateral small pleural effusions. Electronically Signed   By: Marcello Moores  Register   On: 08/05/2018 08:23   Ct Head Wo Contrast  Result Date: 08/09/2018 CLINICAL DATA:  Altered mental status EXAM: CT HEAD WITHOUT CONTRAST TECHNIQUE: Contiguous axial images were obtained from the base of the skull through the vertex without intravenous contrast. COMPARISON:  Head CT 07/31/2009 FINDINGS: Brain: There is no hemorrhage or extra-axial collection. Unchanged appearance of calcified meningioma at the left sphenoid wing. The size and configuration of the ventricles and extra-axial CSF spaces are normal. There is no acute or chronic infarction. There is hypoattenuation of the periventricular white matter, most commonly indicating chronic ischemic microangiopathy. Vascular: Atherosclerotic calcification of the internal carotid arteries at the skull base. No abnormal hyperdensity of the major intracranial arteries or dural venous sinuses.  Skull: The visualized skull base, calvarium and extracranial soft tissues are normal. Sinuses/Orbits: Left-greater-than-right maxillary sinus opacification with fluid levels. There is also a left mastoid effusion. The orbits are normal. IMPRESSION: 1. No acute intracranial abnormality. 2. Unchanged appearance of calcified left sphenoid wing meningioma. 3. Maxillary sinus opacification with fluid levels. Correlate for symptoms of acute sinusitis. Electronically Signed   By: Lennette Bihari  [place] M.D.   On: 08/09/2018 02:19   Ct Chest Wo Contrast  Result Date: 08/13/2018 CLINICAL DATA:  Extensive small cell carcinoma of lung. Worsened dyspnea. Evaluate for worsening pleural effusion and SVC compression. End-stage kidney disease. EXAM: CT CHEST WITHOUT CONTRAST TECHNIQUE: Multidetector CT imaging of the chest was performed following the standard protocol without IV contrast. COMPARISON:  08/13/2018 chest radiograph.  CT 08/09/2018. FINDINGS: Cardiovascular: Mild motion degradation throughout. Aortic and branch vessel atherosclerosis. Median sternotomy for CABG. Mild cardiomegaly, without pericardial effusion. Native coronary artery atherosclerosis. Decrease in retrosternal edema, without postoperative fluid collection. Mediastinum/Nodes: Right paramediastinal soft tissue density which likely represents a combination of adenopathy and surrounding collapse. Estimated on the order of 3.6 x 3.9 cm on image 54/3. Subcarinal node of 1.7 cm on image 78/3. Hilar regions not well evaluated. IVC not delineated. Prevascular adenopathy again identified at 1.4 cm on image 58/3. Lungs/Pleura: Enlargement of a right pleural effusion with increase in small left pleural effusion. No loculation identified. No pneumothorax. Right upper lobe endobronchial compression or obstruction, similar to slightly increased compared to 08/09/2018. Again identified is right upper lobe collapse, presumably secondary to central mass and adenopathy.  Moderate centrilobular emphysema. Progressive left lower lobe dependent atelectasis. Upper Abdomen: Hyperattenuation within the liver. Normal imaged portions of the spleen, stomach, pancreas, gallbladder, kidneys, left adrenal gland. Mild right adrenal nodularity. Upper abdominal adenopathy again identified, with a portacaval node measuring 1.9 cm. Musculoskeletal: Prior median sternotomy. Moderate thoracic spondylosis. IMPRESSION: 1. Mild motion degradation. 2. Since 4 days ago, increase in moderate right and small left pleural effusions with progressive left lower lobe atelectasis. 3. Extensive thoracic adenopathy with right upper lobe collapse, likely secondary to adenopathy and possible central right upper lobe tumor. 4. SVC cannot be evaluated secondary to lack of IV contrast. The presumed adenopathy and tumor are centered along the expected course of the SVC. 5. Upper abdominal adenopathy, suspicious for metastatic disease. 6. Hyperattenuation within the liver which is nonspecific. 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 7. Aortic atherosclerosis (ICD10-I70.0) and emphysema (ICD10-J43.9). Electronically Signed   By: Abigail Miyamoto M.D.   On: 08/13/2018 17:22   Ct Chest Wo Contrast  Result Date: 08/09/2018 CLINICAL DATA:  SOB. Hx small cell lung cancer, paroxysmal a-fib, HTN, diabetes, CHF, CAD, and CABG. EXAM: CT CHEST WITHOUT CONTRAST TECHNIQUE: Multidetector CT imaging of the chest was performed following the standard protocol without IV contrast. COMPARISON:  Chest CT 07/27/2018 FINDINGS: Cardiovascular: Coronary, aortic arch, and branch vessel atherosclerotic vascular disease. Mild cardiomegaly. Compared to the prior chest CT there has been interval CABG. Atrial appendage clip noted. Mediastinum/Nodes: Expected residual mediastinal edema. Trace residual pneumomediastinum, but nearly completely resolved. Small pericardial effusion, not unexpected in the postoperative  setting. Pathologic mediastinal mass/adenopathy. An index AP window lymph node measures 1.7 cm in short axis on image 49/3, stable. The more bulky right paratracheal adenopathy likewise appears relatively stable. Some of this is in the vicinity of the SVC and is at least exerting extrinsic mass effect on the SVC. Lungs/Pleura: There complete obstruction of the right upper lobe bronchus with complete atelectasis of the right upper lobe. Possible bulging tumor into the right mainstem bronchus from the ostium of the right upper lobe bronchus on image 55/4. The bronchus intermedius and its branches appear patent. Moderate right and small left pleural effusions. The right pleural effusion was previously small and the left pleural effusion is new compared to the prior CT. There is passive atelectasis  associated with the pleural effusions. Mild bilateral airway thickening. Paraseptal emphysema. Upper Abdomen: High-density liver with borderline appearance for hemochromatosis. Portacaval node abnormally enlarged at 2.2 cm in short axis on image 144/3, stable. Slight lobularity of the right adrenal gland without a well-defined mass. Musculoskeletal: Lower thoracic spondylosis. No findings of sternal dehiscence. IMPRESSION: 1. Complete atelectasis of the right upper lobe. Probable tumor in the right upper lobe bronchus. Notable mediastinal and likely right hilar adenopathy along with a stable enlarged portacaval lymph node in the upper abdomen which could be malignant. 2. Postoperative findings related to recent CABG including a small residual amount of mediastinal edema and a trace amount of residual pneumomediastinum. 3. There is a moderate right pleural effusion (formerly small on 07/27/2018) and a new small left pleural effusion. These are associated with passive atelectasis. 4. Airway thickening is present, suggesting bronchitis or reactive airways disease. 5. Other imaging findings of potential clinical significance:  Aortic Atherosclerosis (ICD10-I70.0). Coronary atherosclerosis with recent CABG. Emphysema (ICD10-J43.9). High-density hepatic parenchyma suspicious for hemochromatosis. Electronically Signed   By: Van Clines M.D.   On: 08/09/2018 02:30   Mr Jeri Cos WN Contrast  Result Date: 08/09/2018 CLINICAL DATA:  Seizure.  Lung cancer. EXAM: MRI HEAD WITHOUT AND WITH CONTRAST TECHNIQUE: Multiplanar, multiecho pulse sequences of the brain and surrounding structures were obtained without and with intravenous contrast. CONTRAST:  8 mL Gadavist COMPARISON:  Head CT 08/09/2018 FINDINGS: The study is degraded by motion, despite efforts to reduce this artifact, including utilization of motion-resistant MR sequences. The findings of the study are interpreted in the context of reduced sensitivity/specificity. BRAIN: There is no acute infarct, acute hemorrhage, hydrocephalus or extra-axial collection. The midline structures are normal. No midline shift or other mass effect. 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 Multifocal white matter hyperintensity, most commonly due to chronic ischemic microangiopathy. Generalized atrophy without lobar predilection. Susceptibility-sensitive sequences show no chronic microhemorrhage or superficial siderosis. No abnormal contrast enhancement. VASCULAR: Major intracranial arterial and venous sinus flow voids are normal. SKULL AND UPPER CERVICAL SPINE: Calvarial bone marrow signal is normal. There is no skull base mass. Visualized upper cervical spine and soft tissues are normal. SINUSES/ORBITS: There are bilateral maxillary sinus fluid levels and a left mastoid effusion. The orbits are normal. IMPRESSION: 1. No acute intracranial abnormality. 2. No intracranial metastatic disease. 3. Bilateral maxillary sinus fluid levels and left mastoid effusion. Correlate for symptoms of acute sinusitis. 4. Generalized atrophy and chronic ischemic microangiopathy. Electronically Signed    By: Ulyses Jarred M.D.   On: 08/09/2018 23:14   Dg Chest Port 1 View  Result Date: 08/15/2018 CLINICAL DATA:  Chest pain and cough tonight. EXAM: PORTABLE CHEST 1 VIEW COMPARISON:  08/14/2018 FINDINGS: Postoperative changes in the mediastinum. Cardiac enlargement. No vascular congestion. Right upper lung collapse with prominence of the right hilum suggesting obstructing hilar mass. Blunting of the left costophrenic angle suggesting a small effusion. No pneumothorax identified. Calcification of the aorta. Degenerative changes in the spine and shoulders. IMPRESSION: Right hilar mass with collapse of the right upper lung. Small left pleural effusion. Electronically Signed   By: Lucienne Capers M.D.   On: 08/15/2018 04:09   Dg Chest Port 1 View  Result Date: 08/10/2018 CLINICAL DATA:  Shortness of breath. EXAM: PORTABLE CHEST 1 VIEW COMPARISON:  Radiographs of August 09, 2018. FINDINGS: Stable cardiomediastinal silhouette. Status post coronary artery bypass graft. Stable right upper lobe atelectasis is noted with right hilar prominence concerning for neoplasm. No pneumothorax  is noted. Left lung is clear. Right basilar atelectasis or infiltrate is noted with probable associated pleural effusion. Bony thorax is unremarkable. IMPRESSION: Stable right upper lobe atelectasis is noted with right hilar prominence concerning for neoplasm. Stable right basilar atelectasis or infiltrate is noted with associated pleural effusion. Electronically Signed   By: Marijo Conception, M.D.   On: 08/10/2018 11:38   Dg Chest Port 1 View  Result Date: 08/09/2018 CLINICAL DATA:  Right lung cancer. Postoperative for CABG by 1 week. EXAM: PORTABLE CHEST 1 VIEW COMPARISON:  08/08/2018 FINDINGS: There complete atelectasis of the right upper lobe with right hilar prominence favoring tumor. Atrial appendage clip noted.  Prior CABG. Atherosclerotic calcification of the aortic arch. Right basilar atelectasis with some resulting  irregularity of the right hemidiaphragm. No overt cardiomegaly.  Thoracic spondylosis. IMPRESSION: 1. Continued complete atelectasis of the right upper lobe. Mildly increased atelectasis along the right hemidiaphragm. 2. Recent CABG without edema. 3. Atrial appendage clip. 4.  Aortic Atherosclerosis (ICD10-I70.0). Electronically Signed   By: Van Clines M.D.   On: 08/09/2018 01:19   Dg Chest Port 1 View  Result Date: 08/03/2018 CLINICAL DATA:  Follow-up chest tube on the left EXAM: PORTABLE CHEST 1 VIEW COMPARISON:  08/02/2018 FINDINGS: Cardiac shadow is enlarged but stable. Postsurgical changes are again seen. The Swan-Ganz catheter and mediastinal drain have been removed in the interval. Left thoracostomy catheter remains. No definitive pneumothorax is seen. Increasing density is noted in the right upper lobe which may be related to some atelectasis and volume loss. No bony abnormality is seen IMPRESSION: Slight increase in the degree of right upper lobe atelectasis. No pneumothorax is noted on the left. Electronically Signed   By: Inez Catalina M.D.   On: 08/03/2018 08:37   Dg Chest Port 1 View  Result Date: 08/02/2018 CLINICAL DATA:  Chest tube present, follow-up EXAM: PORTABLE CHEST 1 VIEW COMPARISON:  Chest x-ray of 08/01/2017 FINDINGS: The endotracheal tube and NG tube have been removed. A left chest tube remains and no pneumothorax is noted. There is mild basilar volume loss. Right Swan-Ganz catheter is coiled in main pulmonary artery. Cardiomegaly is stable. There may be very mild pulmonary vascular congestion present and small effusions cannot be excluded. Median sternotomy sutures are present IMPRESSION: 1. Endotracheal tube and NG tube removed. 2. Little change in aeration with mild basilar volume loss and possible small effusions. 3. Left chest tube remains with no pneumothorax. 4. Little change in cardiomegaly and possible minimal pulmonary vascular congestion. Electronically Signed   By:  Ivar Drape M.D.   On: 08/02/2018 08:15   Dg Chest Port 1 View  Result Date: 08/01/2018 CLINICAL DATA:  Status post CABG EXAM: PORTABLE CHEST 1 VIEW COMPARISON:  07/30/2018 FINDINGS: ET tube tip is above the carina. Enteric tube tip is below the field of view. There is an IJ catheter with tip looped in the right ventricular outflow tract. Mediastinal drain and left chest tubes identified. No pneumothorax identified. Mild edema identified within the right base. No airspace densities identified. Right perihilar and right paratracheal lung mass again noted. IMPRESSION: 1. Postoperative changes from CABG procedure. No complications identified. 2. Right perihilar and right paratracheal lung mass as before. Electronically Signed   By: Kerby Moors M.D.   On: 08/01/2018 14:25   US Thoracentesis Asp Pleural Space W/img Guide  Result Date: 08/14/2018 INDICATION: New onset dyspnea s/p CABG, new diagnosis of small cell lung cancer. History of CHF, CKD, ischemic cardiomyopathy. Bilateral pleural  effusions on recent imaging with right greater than left. Request for diagnostic and therapeutic thoracentesis today. EXAM: ULTRASOUND GUIDED RIGHT THORACENTESIS MEDICATIONS: 8 mL 1% lidocaine COMPLICATIONS: None immediate. PROCEDURE: An ultrasound guided thoracentesis was thoroughly discussed with the patient and questions answered. The benefits, risks, alternatives and complications were also discussed. The patient understands and wishes to proceed with the procedure. Written consent was obtained. Ultrasound was performed to localize and mark an adequate pocket of fluid in the right chest. The area was then prepped and draped in the normal sterile fashion. 1% Lidocaine was used for local anesthesia. Under ultrasound guidance a 6 Fr Safe-T-Centesis catheter was introduced. Thoracentesis was performed. The catheter was removed and a dressing applied. FINDINGS: A total of approximately 1.2 L of bloody fluid was removed.  Samples were sent to the laboratory as requested by the clinical team. IMPRESSION: Successful ultrasound guided right thoracentesis yielding 1.2 L of pleural fluid. Read by Candiss Norse, PA-C Electronically Signed   By: Markus Daft M.D.   On: 08/14/2018 14:23    ASSESSMENT AND PLAN: This is a very pleasant 77 years old white male recently diagnosed with extensive stage small cell lung cancer.  The patient is here today for evaluation before starting the first cycle of his systemic chemotherapy with carboplatin, etoposide and Tecentriq. The patient is feeling fine. I recommended for him to proceed with the first cycle of his treatment today as a schedule. I will see him back for follow-up visit in 3 weeks for evaluation before starting cycle #2. The patient will have weekly lab work for close monitoring of his condition and treatment side effects. He was advised to call immediately if he has any concerning symptoms in the interval. The patient voices understanding of current disease status and treatment options and is in agreement with the current care plan.  All questions were answered. The patient knows to call the clinic with any problems, questions or concerns. We can certainly see the patient much sooner if necessary.  I spent 10 minutes counseling the patient face to face. The total time spent in the appointment was 15 minutes.  Disclaimer: This note was dictated with voice recognition software. Similar sounding words can inadvertently be transcribed and may not be corrected upon review.

## 2018-08-30 ENCOUNTER — Inpatient Hospital Stay: Payer: BLUE CROSS/BLUE SHIELD

## 2018-08-30 ENCOUNTER — Ambulatory Visit: Payer: BLUE CROSS/BLUE SHIELD

## 2018-08-30 VITALS — BP 120/52 | HR 70 | Temp 97.9°F | Resp 17

## 2018-08-30 DIAGNOSIS — C3490 Malignant neoplasm of unspecified part of unspecified bronchus or lung: Secondary | ICD-10-CM

## 2018-08-30 DIAGNOSIS — C3411 Malignant neoplasm of upper lobe, right bronchus or lung: Secondary | ICD-10-CM | POA: Diagnosis not present

## 2018-08-30 MED ORDER — SODIUM CHLORIDE 0.9 % IV SOLN: 200.0000 mg | Freq: Once | INTRAVENOUS | Status: DC

## 2018-08-30 MED ORDER — SODIUM CHLORIDE 0.9 % IV SOLN
Freq: Once | INTRAVENOUS | Status: AC
  Administered 2018-08-30: 14:00:00 via INTRAVENOUS
  Filled 2018-08-30: qty 250

## 2018-08-30 MED ORDER — DEXAMETHASONE SODIUM PHOSPHATE 10 MG/ML IJ SOLN
10.0000 mg | Freq: Once | INTRAMUSCULAR | Status: AC
  Administered 2018-08-30: 10 mg via INTRAVENOUS

## 2018-08-30 MED ORDER — SODIUM CHLORIDE 0.9 % IV SOLN
200.0000 mg | Freq: Once | INTRAVENOUS | Status: AC
  Administered 2018-08-30: 200 mg via INTRAVENOUS
  Filled 2018-08-30: qty 10

## 2018-08-30 MED ORDER — DEXAMETHASONE SODIUM PHOSPHATE 10 MG/ML IJ SOLN
INTRAMUSCULAR | Status: AC
  Filled 2018-08-30: qty 1

## 2018-08-30 NOTE — Patient Instructions (Signed)
[facility] Discharge Instructions for Patients Receiving Chemotherapy  Today you received the following chemotherapy agents: Etoposide (Vepesid)  To help prevent nausea and vomiting after your treatment, we encourage you to take your nausea medication as directed.    If you develop nausea and vomiting that is not controlled by your nausea medication, call the clinic.   BELOW ARE SYMPTOMS THAT SHOULD BE REPORTED IMMEDIATELY:  *FEVER GREATER THAN 100.5 F  *CHILLS WITH OR WITHOUT FEVER  NAUSEA AND VOMITING THAT IS NOT CONTROLLED WITH YOUR NAUSEA MEDICATION  *UNUSUAL SHORTNESS OF BREATH  *UNUSUAL BRUISING OR BLEEDING  TENDERNESS IN MOUTH AND THROAT WITH OR WITHOUT PRESENCE OF ULCERS  *URINARY PROBLEMS  *BOWEL PROBLEMS  UNUSUAL RASH Items with * indicate a potential emergency and should be followed up as soon as possible.  Feel free to call the clinic should you have any questions or concerns. The clinic phone number is [phone].  Please show the [facility] at check-in to the Emergency Department and triage nurse.

## 2018-08-31 ENCOUNTER — Inpatient Hospital Stay: Payer: BLUE CROSS/BLUE SHIELD

## 2018-08-31 ENCOUNTER — Ambulatory Visit: Payer: BLUE CROSS/BLUE SHIELD

## 2018-08-31 VITALS — BP 117/51 | HR 68 | Temp 98.0°F | Resp 16

## 2018-08-31 DIAGNOSIS — C3490 Malignant neoplasm of unspecified part of unspecified bronchus or lung: Secondary | ICD-10-CM

## 2018-08-31 DIAGNOSIS — C3411 Malignant neoplasm of upper lobe, right bronchus or lung: Secondary | ICD-10-CM | POA: Diagnosis not present

## 2018-08-31 MED ORDER — DEXAMETHASONE SODIUM PHOSPHATE 10 MG/ML IJ SOLN
INTRAMUSCULAR | Status: AC
  Filled 2018-08-31: qty 1

## 2018-08-31 MED ORDER — DEXAMETHASONE SODIUM PHOSPHATE 10 MG/ML IJ SOLN
10.0000 mg | Freq: Once | INTRAMUSCULAR | Status: AC
  Administered 2018-08-31: 10 mg via INTRAVENOUS

## 2018-08-31 MED ORDER — SODIUM CHLORIDE 0.9 % IV SOLN
94.0000 mg/m2 | Freq: Once | INTRAVENOUS | Status: AC
  Administered 2018-08-31: 200 mg via INTRAVENOUS
  Filled 2018-08-31: qty 10

## 2018-08-31 MED ORDER — SODIUM CHLORIDE 0.9 % IV SOLN
Freq: Once | INTRAVENOUS | Status: AC
  Administered 2018-08-31: 13:00:00 via INTRAVENOUS
  Filled 2018-08-31: qty 250

## 2018-09-01 ENCOUNTER — Other Ambulatory Visit: Payer: Self-pay

## 2018-09-01 ENCOUNTER — Ambulatory Visit: Payer: BLUE CROSS/BLUE SHIELD

## 2018-09-01 NOTE — Patient Outreach (Signed)
Transition of care:  Today's Vitals   09/01/18 1501  Weight: 173 lb (78.5 kg)   Placed call to patient who answered and reports that he is doing well. Reports breathing and coughing is about the same. Reports that he has started chemo with no adverse reactions at this time.  Reports weight is inching up due to increased appetite.  Reports no new concerns today.  PLAN: Will continue weekly transition of care calls. Encouraged patient to report any changes in condition to MD.  Tomasa Rand, RN, BSN, CEN [facility] Coordinator [phone]

## 2018-09-01 NOTE — Telephone Encounter (Signed)
Left message to call back  

## 2018-09-02 ENCOUNTER — Other Ambulatory Visit (HOSPITAL_COMMUNITY): Payer: BLUE CROSS/BLUE SHIELD

## 2018-09-02 ENCOUNTER — Ambulatory Visit (HOSPITAL_COMMUNITY): Payer: 59

## 2018-09-02 ENCOUNTER — Inpatient Hospital Stay: Payer: BLUE CROSS/BLUE SHIELD | Attending: Internal Medicine

## 2018-09-02 ENCOUNTER — Ambulatory Visit: Payer: Medicare HMO

## 2018-09-02 DIAGNOSIS — Z79899 Other long term (current) drug therapy: Secondary | ICD-10-CM | POA: Insufficient documentation

## 2018-09-02 DIAGNOSIS — C3490 Malignant neoplasm of unspecified part of unspecified bronchus or lung: Secondary | ICD-10-CM

## 2018-09-02 DIAGNOSIS — I110 Hypertensive heart disease with heart failure: Secondary | ICD-10-CM | POA: Diagnosis not present

## 2018-09-02 DIAGNOSIS — I6523 Occlusion and stenosis of bilateral carotid arteries: Secondary | ICD-10-CM | POA: Insufficient documentation

## 2018-09-02 DIAGNOSIS — J439 Emphysema, unspecified: Secondary | ICD-10-CM | POA: Diagnosis not present

## 2018-09-02 DIAGNOSIS — E871 Hypo-osmolality and hyponatremia: Secondary | ICD-10-CM | POA: Insufficient documentation

## 2018-09-02 DIAGNOSIS — Z86010 Personal history of colonic polyps: Secondary | ICD-10-CM | POA: Diagnosis not present

## 2018-09-02 DIAGNOSIS — I252 Old myocardial infarction: Secondary | ICD-10-CM | POA: Diagnosis not present

## 2018-09-02 DIAGNOSIS — E785 Hyperlipidemia, unspecified: Secondary | ICD-10-CM | POA: Diagnosis not present

## 2018-09-02 DIAGNOSIS — C781 Secondary malignant neoplasm of mediastinum: Secondary | ICD-10-CM | POA: Diagnosis not present

## 2018-09-02 DIAGNOSIS — Z5111 Encounter for antineoplastic chemotherapy: Secondary | ICD-10-CM | POA: Insufficient documentation

## 2018-09-02 DIAGNOSIS — D649 Anemia, unspecified: Secondary | ICD-10-CM | POA: Insufficient documentation

## 2018-09-02 DIAGNOSIS — N289 Disorder of kidney and ureter, unspecified: Secondary | ICD-10-CM | POA: Insufficient documentation

## 2018-09-02 DIAGNOSIS — Z7901 Long term (current) use of anticoagulants: Secondary | ICD-10-CM | POA: Insufficient documentation

## 2018-09-02 DIAGNOSIS — Z7982 Long term (current) use of aspirin: Secondary | ICD-10-CM | POA: Insufficient documentation

## 2018-09-02 DIAGNOSIS — Z7689 Persons encountering health services in other specified circumstances: Secondary | ICD-10-CM | POA: Diagnosis not present

## 2018-09-02 DIAGNOSIS — I2510 Atherosclerotic heart disease of native coronary artery without angina pectoris: Secondary | ICD-10-CM | POA: Diagnosis not present

## 2018-09-02 DIAGNOSIS — I480 Paroxysmal atrial fibrillation: Secondary | ICD-10-CM | POA: Insufficient documentation

## 2018-09-02 DIAGNOSIS — J90 Pleural effusion, not elsewhere classified: Secondary | ICD-10-CM | POA: Diagnosis not present

## 2018-09-02 DIAGNOSIS — C7989 Secondary malignant neoplasm of other specified sites: Secondary | ICD-10-CM | POA: Insufficient documentation

## 2018-09-02 DIAGNOSIS — I4891 Unspecified atrial fibrillation: Secondary | ICD-10-CM | POA: Insufficient documentation

## 2018-09-02 DIAGNOSIS — I255 Ischemic cardiomyopathy: Secondary | ICD-10-CM | POA: Diagnosis not present

## 2018-09-02 DIAGNOSIS — C3411 Malignant neoplasm of upper lobe, right bronchus or lung: Secondary | ICD-10-CM | POA: Insufficient documentation

## 2018-09-02 DIAGNOSIS — I714 Abdominal aortic aneurysm, without rupture: Secondary | ICD-10-CM | POA: Insufficient documentation

## 2018-09-02 DIAGNOSIS — Z5112 Encounter for antineoplastic immunotherapy: Secondary | ICD-10-CM | POA: Diagnosis not present

## 2018-09-02 DIAGNOSIS — E119 Type 2 diabetes mellitus without complications: Secondary | ICD-10-CM | POA: Diagnosis not present

## 2018-09-02 MED ORDER — PEGFILGRASTIM-CBQV 6 MG/0.6ML ~~LOC~~ SOSY
PREFILLED_SYRINGE | SUBCUTANEOUS | Status: AC
  Filled 2018-09-02: qty 0.6

## 2018-09-02 MED ORDER — PEGFILGRASTIM-CBQV 6 MG/0.6ML ~~LOC~~ SOSY
6.0000 mg | PREFILLED_SYRINGE | Freq: Once | SUBCUTANEOUS | Status: AC
  Administered 2018-09-02: 6 mg via SUBCUTANEOUS

## 2018-09-02 NOTE — Patient Instructions (Signed)
Pegfilgrastim injection What is this medicine? PEGFILGRASTIM (PEG fil gra stim) is a long-acting granulocyte colony-stimulating factor that stimulates the growth of neutrophils, a type of white blood cell important in the body's fight against infection. It is used to reduce the incidence of fever and infection in patients with certain types of cancer who are receiving chemotherapy that affects the bone marrow, and to increase survival after being exposed to high doses of radiation. This medicine may be used for other purposes; ask your health care provider or pharmacist if you have questions. COMMON BRAND NAME(S): Neulasta What should I tell my health care provider before I take this medicine? They need to know if you have any of these conditions: -kidney disease -latex allergy -ongoing radiation therapy -sickle cell disease -skin reactions to acrylic adhesives (On-Body Injector only) -an unusual or allergic reaction to pegfilgrastim, filgrastim, other medicines, foods, dyes, or preservatives -pregnant or trying to get pregnant -breast-feeding How should I use this medicine? This medicine is for injection under the skin. If you get this medicine at home, you will be taught how to prepare and give the pre-filled syringe or how to use the On-body Injector. Refer to the patient Instructions for Use for detailed instructions. Use exactly as directed. Tell your healthcare provider immediately if you suspect that the On-body Injector may not have performed as intended or if you suspect the use of the On-body Injector resulted in a missed or partial dose. It is important that you put your used needles and syringes in a special sharps container. Do not put them in a trash can. If you do not have a sharps container, call your pharmacist or healthcare provider to get one. Talk to your pediatrician regarding the use of this medicine in children. While this drug may be prescribed for selected conditions,  precautions do apply. Overdosage: If you think you have taken too much of this medicine contact a poison control center or emergency room at once. NOTE: This medicine is only for you. Do not share this medicine with others. What if I miss a dose? It is important not to miss your dose. Call your doctor or health care professional if you miss your dose. If you miss a dose due to an On-body Injector failure or leakage, a new dose should be administered as soon as possible using a single prefilled syringe for manual use. What may interact with this medicine? Interactions have not been studied. Give your health care provider a list of all the medicines, herbs, non-prescription drugs, or dietary supplements you use. Also tell them if you smoke, drink alcohol, or use illegal drugs. Some items may interact with your medicine. This list may not describe all possible interactions. Give your health care provider a list of all the medicines, herbs, non-prescription drugs, or dietary supplements you use. Also tell them if you smoke, drink alcohol, or use illegal drugs. Some items may interact with your medicine. What should I watch for while using this medicine? You may need blood work done while you are taking this medicine. If you are going to need a MRI, CT scan, or other procedure, tell your doctor that you are using this medicine (On-Body Injector only). What side effects may I notice from receiving this medicine? Side effects that you should report to your doctor or health care professional as soon as possible: -allergic reactions like skin rash, itching or hives, swelling of the face, lips, or tongue -dizziness -fever -pain, redness, or irritation at site   where injected -pinpoint red spots on the skin -red or dark-brown urine -shortness of breath or breathing problems -stomach or side pain, or pain at the shoulder -swelling -tiredness -trouble passing urine or change in the amount of urine Side  effects that usually do not require medical attention (report to your doctor or health care professional if they continue or are bothersome): -bone pain -muscle pain This list may not describe all possible side effects. Call your doctor for medical advice about side effects. You may report side effects to FDA at 1-800-FDA-1088. Where should I keep my medicine? Keep out of the reach of children. Store pre-filled syringes in a refrigerator between 2 and 8 degrees C (36 and 46 degrees F). Do not freeze. Keep in carton to protect from light. Throw away this medicine if it is left out of the refrigerator for more than 48 hours. Throw away any unused medicine after the expiration date. NOTE: This sheet is a summary. It may not cover all possible information. If you have questions about this medicine, talk to your doctor, pharmacist, or health care provider.  2018 Elsevier/Gold Standard (2016-10-15 12:58:03)  

## 2018-09-05 ENCOUNTER — Inpatient Hospital Stay: Payer: BLUE CROSS/BLUE SHIELD

## 2018-09-05 ENCOUNTER — Other Ambulatory Visit: Payer: Self-pay | Admitting: *Deleted

## 2018-09-05 ENCOUNTER — Ambulatory Visit: Payer: Medicare HMO

## 2018-09-05 DIAGNOSIS — C3491 Malignant neoplasm of unspecified part of right bronchus or lung: Secondary | ICD-10-CM

## 2018-09-05 DIAGNOSIS — C3411 Malignant neoplasm of upper lobe, right bronchus or lung: Secondary | ICD-10-CM | POA: Diagnosis not present

## 2018-09-05 LAB — CMP (CANCER CENTER ONLY)
ALK PHOS: 95 U/L (ref 38–126)
ALT: 36 U/L (ref 0–44)
ANION GAP: 8 (ref 5–15)
AST: 20 U/L (ref 15–41)
Albumin: 3.1 g/dL — ABNORMAL LOW (ref 3.5–5.0)
BILIRUBIN TOTAL: 0.4 mg/dL (ref 0.3–1.2)
BUN: 11 mg/dL (ref 8–23)
CALCIUM: 8.6 mg/dL — AB (ref 8.9–10.3)
CO2: 25 mmol/L (ref 22–32)
CREATININE: 1.06 mg/dL (ref 0.61–1.24)
Chloride: 100 mmol/L (ref 98–111)
GFR, Est AFR Am: >60 mL/min (ref >60)
GLUCOSE: 164 mg/dL — AB (ref 70–99)
Potassium: 4.4 mmol/L (ref 3.5–5.1)
Sodium: 133 mmol/L — ABNORMAL LOW (ref 135–145)
Total Protein: 5.8 g/dL — ABNORMAL LOW (ref 6.5–8.1)

## 2018-09-05 LAB — CBC WITH DIFFERENTIAL (CANCER CENTER ONLY)
Band Neutrophils: 1 %
Basophils Absolute: 0.1 10*3/uL (ref 0.0–0.1)
Basophils Relative: 1 %
EOS PCT: 1 %
Eosinophils Absolute: 0.1 10*3/uL (ref 0.0–0.5)
HCT: 29.9 % — ABNORMAL LOW (ref 39.0–52.0)
Hemoglobin: 9.6 g/dL — ABNORMAL LOW (ref 13.0–17.0)
LYMPHS ABS: 0.6 10*3/uL — AB (ref 0.7–4.0)
LYMPHS PCT: 10 %
MCH: 29.1 pg (ref 26.0–34.0)
MCHC: 32.1 g/dL (ref 30.0–36.0)
MCV: 90.6 fL (ref 80.0–100.0)
Monocytes Absolute: 0 10*3/uL — ABNORMAL LOW (ref 0.1–1.0)
Monocytes Relative: 0 %
Neutro Abs: 5 10*3/uL (ref 1.7–17.7)
Neutrophils Relative %: 87 %
PLATELETS: 87 10*3/uL — AB (ref 150–400)
RBC: 3.3 MIL/uL — ABNORMAL LOW (ref 4.22–5.81)
RDW: 14 % (ref 11.5–15.5)
WBC Count: 5.7 10*3/uL (ref 4.0–10.5)
nRBC: 0 % (ref 0.0–0.2)

## 2018-09-06 ENCOUNTER — Encounter: Payer: Self-pay | Admitting: Internal Medicine

## 2018-09-06 ENCOUNTER — Ambulatory Visit: Payer: Medicare HMO

## 2018-09-06 NOTE — Progress Notes (Signed)
Reviewed pt's chart and unfortunately there aren't any foundations offering copay assistance for his Dx and the type of ins he has.  Since pt will be starting radiation and coming from [place], I emailed Ailene Ravel and [facility] requesting they reach out to the pt to see if he can benefit from the [employer].

## 2018-09-07 ENCOUNTER — Ambulatory Visit: Payer: Medicare HMO

## 2018-09-08 ENCOUNTER — Ambulatory Visit: Payer: Medicare HMO

## 2018-09-08 ENCOUNTER — Other Ambulatory Visit: Payer: Self-pay

## 2018-09-08 NOTE — Patient Outreach (Signed)
Transition of care:  Placed call to patient, wife answered and states she is at MD appointment. Reports patient is doing well. Eating well. Reports no swelling but not able to provide weight. Wife reports that patient will return to cancer center tomorrow for testing. Wife states patient was able to mulch leaves yesterday with mower.   PLAN: will follow up in 1 week with patient.  Tomasa Rand, RN, BSN, CEN [facility] [employer] [phone]

## 2018-09-09 ENCOUNTER — Ambulatory Visit: Payer: Medicare HMO

## 2018-09-09 ENCOUNTER — Ambulatory Visit (HOSPITAL_COMMUNITY)
Admission: RE | Admit: 2018-09-09 | Discharge: 2018-09-09 | Disposition: A | Discharge status: Home / Self Care | Payer: BLUE CROSS/BLUE SHIELD | Source: Ambulatory Visit | Attending: Radiation Oncology | Admitting: Radiation Oncology

## 2018-09-09 DIAGNOSIS — C3490 Malignant neoplasm of unspecified part of unspecified bronchus or lung: Secondary | ICD-10-CM

## 2018-09-09 LAB — GLUCOSE, CAPILLARY: GLUCOSE-CAPILLARY: 113 mg/dL — AB (ref 70–99)

## 2018-09-09 MED ORDER — FLUDEOXYGLUCOSE F - 18 (FDG) INJECTION
8.9000 | Freq: Once | INTRAVENOUS | Status: AC | PRN
  Administered 2018-09-09: 8.9 via INTRAVENOUS

## 2018-09-09 NOTE — Telephone Encounter (Signed)
I spoke with pt's wife and reviewed lab results from 08/24/18 with her.

## 2018-09-12 ENCOUNTER — Inpatient Hospital Stay: Payer: BLUE CROSS/BLUE SHIELD

## 2018-09-12 ENCOUNTER — Encounter: Payer: Self-pay | Admitting: *Deleted

## 2018-09-12 ENCOUNTER — Ambulatory Visit: Payer: Medicare HMO

## 2018-09-12 DIAGNOSIS — C3411 Malignant neoplasm of upper lobe, right bronchus or lung: Secondary | ICD-10-CM | POA: Diagnosis not present

## 2018-09-12 DIAGNOSIS — R5382 Chronic fatigue, unspecified: Secondary | ICD-10-CM

## 2018-09-12 DIAGNOSIS — C3491 Malignant neoplasm of unspecified part of right bronchus or lung: Secondary | ICD-10-CM

## 2018-09-12 LAB — CBC WITH DIFFERENTIAL (CANCER CENTER ONLY)
Abs Immature Granulocytes: 5.23 10*3/uL — ABNORMAL HIGH (ref 0.00–0.07)
BASOS ABS: 0 10*3/uL (ref 0.0–0.1)
BASOS PCT: 0 %
EOS ABS: 0 10*3/uL (ref 0.0–0.5)
EOS PCT: 0 %
HCT: 32.5 % — ABNORMAL LOW (ref 39.0–52.0)
Hemoglobin: 10.3 g/dL — ABNORMAL LOW (ref 13.0–17.0)
IMMATURE GRANULOCYTES: 17 %
Lymphocytes Relative: 7 %
Lymphs Abs: 2 10*3/uL (ref 0.7–4.0)
MCH: 28.5 pg (ref 26.0–34.0)
MCHC: 31.7 g/dL (ref 30.0–36.0)
MCV: 89.8 fL (ref 80.0–100.0)
Monocytes Absolute: 2.6 10*3/uL — ABNORMAL HIGH (ref 0.1–1.0)
Monocytes Relative: 8 %
NEUTROS PCT: 68 %
NRBC: 0.3 % — AB (ref 0.0–0.2)
Neutro Abs: 20.5 10*3/uL — ABNORMAL HIGH (ref 1.7–7.7)
PLATELETS: 117 10*3/uL — AB (ref 150–400)
RBC: 3.62 MIL/uL — ABNORMAL LOW (ref 4.22–5.81)
RDW: 15.1 % (ref 11.5–15.5)
WBC: 30.3 10*3/uL — AB (ref 4.0–10.5)

## 2018-09-12 LAB — CMP (CANCER CENTER ONLY)
ALK PHOS: 129 U/L — AB (ref 38–126)
ALT: 23 U/L (ref 0–44)
AST: 23 U/L (ref 15–41)
Albumin: 3.6 g/dL (ref 3.5–5.0)
Anion gap: 10 (ref 5–15)
BILIRUBIN TOTAL: 0.3 mg/dL (ref 0.3–1.2)
BUN: 16 mg/dL (ref 8–23)
CALCIUM: 9.2 mg/dL (ref 8.9–10.3)
CO2: 29 mmol/L (ref 22–32)
CREATININE: 1.6 mg/dL — AB (ref 0.61–1.24)
Chloride: 98 mmol/L (ref 98–111)
GFR, EST AFRICAN AMERICAN: 46 mL/min — AB (ref >60)
GFR, EST NON AFRICAN AMERICAN: 40 mL/min — AB (ref >60)
Glucose, Bld: 170 mg/dL — ABNORMAL HIGH (ref 70–99)
Potassium: 3.9 mmol/L (ref 3.5–5.1)
Sodium: 137 mmol/L (ref 135–145)
TOTAL PROTEIN: 6.6 g/dL (ref 6.5–8.1)

## 2018-09-12 LAB — TSH: TSH: 0.293 u[IU]/mL — ABNORMAL LOW (ref 0.320–4.118)

## 2018-09-13 ENCOUNTER — Ambulatory Visit: Payer: Medicare HMO

## 2018-09-13 ENCOUNTER — Encounter: Payer: Self-pay | Admitting: *Deleted

## 2018-09-14 ENCOUNTER — Other Ambulatory Visit: Payer: Self-pay | Admitting: Cardiothoracic Surgery

## 2018-09-14 ENCOUNTER — Ambulatory Visit: Payer: Medicare HMO

## 2018-09-14 ENCOUNTER — Telehealth: Payer: Self-pay | Admitting: Radiation Oncology

## 2018-09-14 DIAGNOSIS — I2510 Atherosclerotic heart disease of native coronary artery without angina pectoris: Secondary | ICD-10-CM

## 2018-09-14 NOTE — Telephone Encounter (Signed)
I called the patient back to review his PET imaging. He reports he is doing well and denies any shortness of breath, edema of his upper extremities or neck/face. He denies any trouble laying flat, with fevers or significant productive mucous. He sounds much better by phone than he did when we first met a few weeks ago. We reviewed his PET imaging. He does have extensive stage disease. While there was previously concern for SVC on imaging results, he does not appear by phone triage to have symptoms of this, and this was not called on PET imaging. We will hold off on radiotherapy now as he is not symptomatic to require palliative radiation. We will follow along peripherally to see his response to treatment with chemotherapy, and could consider prophylactic cranial irradiation in the future to reduce the risk of developing brain disease. He is in agreement and will otherwise return on Monday for chemotherapy as previously outlined by Dr. Julien Nordmann and his team.      Carola Rhine, [facility]

## 2018-09-15 ENCOUNTER — Ambulatory Visit: Payer: Medicare HMO

## 2018-09-15 ENCOUNTER — Other Ambulatory Visit: Payer: Self-pay

## 2018-09-15 ENCOUNTER — Ambulatory Visit
Admission: RE | Admit: 2018-09-15 | Discharge: 2018-09-15 | Disposition: A | Discharge status: Home / Self Care | Payer: BLUE CROSS/BLUE SHIELD | Source: Ambulatory Visit | Attending: Cardiothoracic Surgery | Admitting: Cardiothoracic Surgery

## 2018-09-15 ENCOUNTER — Ambulatory Visit: Payer: BLUE CROSS/BLUE SHIELD | Admitting: Physician Assistant

## 2018-09-15 ENCOUNTER — Ambulatory Visit (INDEPENDENT_AMBULATORY_CARE_PROVIDER_SITE_OTHER): Payer: Self-pay | Admitting: Cardiothoracic Surgery

## 2018-09-15 VITALS — BP 124/65 | HR 85 | Resp 20 | Ht 71.0 in | Wt 173.0 lb

## 2018-09-15 DIAGNOSIS — R918 Other nonspecific abnormal finding of lung field: Secondary | ICD-10-CM

## 2018-09-15 DIAGNOSIS — I2510 Atherosclerotic heart disease of native coronary artery without angina pectoris: Secondary | ICD-10-CM

## 2018-09-15 DIAGNOSIS — Z9889 Other specified postprocedural states: Secondary | ICD-10-CM

## 2018-09-15 DIAGNOSIS — R911 Solitary pulmonary nodule: Secondary | ICD-10-CM

## 2018-09-15 DIAGNOSIS — Z951 Presence of aortocoronary bypass graft: Secondary | ICD-10-CM

## 2018-09-15 NOTE — Progress Notes (Signed)
[address]       [place] [address]             [phone]      Gregory Bates Bryan Medical Record #[id number] Date of Birth: Feb 21, 1941  Referring: Burnell Blanks* Primary Care: Street, Sharon Mt, MD Primary Cardiologist: Lauree Chandler, MD   Chief Complaint:   POST OP FOLLOW UP OPERATIVE REPORT  DATE OF PROCEDURE:  08/01/2018  PREOPERATIVE DIAGNOSES:   1.  Coronary artery bypass grafting 2.  Right hilar mass.  POSTOPERATIVE DIAGNOSES:   1.  Coronary artery bypass grafting 2.  Right hilar mass with quick stain.   3.  Positive small cell carcinoma of the lung.  PROCEDURE PERFORMED:   1.  Coronary artery bypass grafting x3 with left internal mammary to the left anterior descending coronary artery, reverse saphenous vein graft to the obtuse marginal, reverse saphenous vein graft to the posterior descending coronary artery with right  greater saphenous endoscopic vein harvesting.  Placement of AtriCure left atrial clip 50 mm.   2.  Biopsy of the intrapericardial lymph node and biopsy of right hilar mass.  SURGEON:  Lanelle Bal, MD History of Present Illness:     Patient making reasonable progress especially considering his diagnosis and concomitant severe left main disease and small cell carcinoma involving the right hilum and pericardium.  The patient has tolerated starting chemotherapy, he notes he was called by the cancer center and had his radiation therapy canceled he was not sure why this was done.     Past Medical History:  Diagnosis Date  . Acute renal insufficiency    a. During 11/2013 adm with atrial flutter.  . Anemia, unspecified   . Atrial fibrillation ([place])   . CAD (coronary artery disease), native coronary artery    a. s/p cath May 2011 >> DES distal RCA;  b. 01/2013 NSTEMI >> OM1 99p (2.75x12 Promus Premier DES);  c. LHC (2/15): PCI: Promus DES to dist RCA ;  d. LHC 1/16 - dLM 73, oLAD 30, small D1  tandem 20, pOM1 stent ok, mOM 50-60, dOM bifurcation 50-60 in both vessels, AV 56-70 jailed by stent, pRCA 28 mRCA stent ok, dRCA stent ok, PLB smal 50, dPDA 60, 80; EF 35-40 >> med Rx - PCI of PDA if angina  . Chronic combined systolic and diastolic CHF (congestive heart failure) ([place]) 12/23/2015   Echo 2/17: Mild LVH, EF 45-50%, inferior akinesis, grade 2 diastolic dysfunction, mild AI, mild MR, mild LAE, PASP 44 mmHg   . Colorectal polyps 2002  . Diabetes mellitus    AODM  . Erectile dysfunction   . History of echocardiogram    a. 01/2013 Echo: EF 55%, mid-dist inflat HK, Gr1 DD, Triv AI/TR, Mild MR.;  b.  Echo (1/15): Mild LVH, EF 55%, inferolateral hypokinesis, grade 1 diastolic dysfunction, mild AI, trivial MR, moderate LAE, PASP 36 mmHg   . Hx of cardiovascular stress test    Stress myoview 08/07/13 with LVEF 44%, inferior scar, no ischemia  . Hyperlipidemia   . Hypertension   . Hyponatremia   . Ischemic cardiomyopathy    a. EF 35-40% by LHC in 1/16; b. Echo 2/17: Mild LVH, EF 45-50%, inferior akinesis, grade 2 diastolic dysfunction, mild AI, mild MR, mild LAE, PASP 44 mmHg  . Myocardial infarction ([place])   . Oxygen deficiency   . PAF (paroxysmal atrial fibrillation) ([place])    a. Dx 11/2013. Spont conv. Placed  on eliquis.  . Small cell lung cancer in adult [facility]) 07/28/2018     Social History   Tobacco Use  Smoking Status Former Smoker  . Last attempt to quit: 11/02/1984  . Years since quitting: 33.8  Smokeless Tobacco Never Used  Tobacco Comment   smoked [place], up to 3 ppd (!)    Social History   Substance and Sexual Activity  Alcohol Use No     Allergies  Allergen Reactions  . Brilinta [Ticagrelor] Other (See Comments)    Arthralgias & myalgias  . Crestor [Rosuvastatin] Other (See Comments)    Myalgias    Current Outpatient Medications  Medication Sig Dispense Refill  . acetaminophen (TYLENOL) 325 MG tablet Take 2 tablets (650 mg total) by mouth every 6 (six)  hours as needed for mild pain.    [name] Kitchen albuterol (PROVENTIL HFA;VENTOLIN HFA) 108 (90 Base) MCG/ACT inhaler Inhale 2 puffs into the lungs every 6 (six) hours as needed for wheezing or shortness of breath. 1 Inhaler 2  . amiodarone (PACERONE) 200 MG tablet Take 1 tablet (200 mg total) by mouth 2 (two) times daily. For one week;then take 200 mg daily thereafter (Patient taking differently: Take 200 mg by mouth See admin instructions. Take 200mg  by mouth twice daily for one week;then take 200 mg daily thereafter) 90 tablet 3  . aspirin EC 81 MG EC tablet Take 1 tablet (81 mg total) by mouth daily.    [name] Kitchen atorvastatin (LIPITOR) 20 MG tablet Take 1 tablet (20 mg total) by mouth daily. 90 tablet 3  . cholecalciferol (VITAMIN D) 1000 UNITS tablet Take 1,000 Units by mouth every evening.     . clopidogrel (PLAVIX) 75 MG tablet     . cyclobenzaprine (FLEXERIL) 5 MG tablet Take 1 tablet (5 mg total) by mouth 2 (two) times daily as needed for muscle spasms (cramping). 30 tablet 0  . ELIQUIS 5 MG TABS tablet TAKE 1 TABLET BY MOUTH TWICE DAILY (Patient taking differently: Take 5 mg by mouth 2 (two) times daily. ) 180 tablet 3  . fexofenadine (ALLEGRA) 180 MG tablet Take 180 mg by mouth daily as needed for allergies.     . fluticasone (FLONASE) 50 MCG/ACT nasal spray Place 1 spray into both nostrils daily as needed for allergies.     . furosemide (LASIX) 40 MG tablet Take 1 tablet (40 mg total) by mouth 2 (two) times daily. 60 tablet 3  . guaiFENesin (MUCINEX) 600 MG 12 hr tablet Take 1 tablet (600 mg total) by mouth 2 (two) times daily as needed for cough or to loosen phlegm.    . isosorbide mononitrate (IMDUR) 60 MG 24 hr tablet Take 60 mg by mouth daily.    . metoprolol tartrate (LOPRESSOR) 25 MG tablet Take 1 tablet (25 mg total) by mouth 2 (two) times daily. 180 tablet 3  . Multiple Vitamin (MULTIVITAMIN) tablet Take 1 tablet by mouth at bedtime.     . ondansetron (ZOFRAN ODT) 4 MG disintegrating tablet Take 1  tablet (4 mg total) by mouth every 8 (eight) hours as needed for nausea or vomiting. 20 tablet 0  . prochlorperazine (COMPAZINE) 10 MG tablet Take 1 tablet (10 mg total) by mouth every 6 (six) hours as needed for nausea or vomiting. 30 tablet 0  . traMADol (ULTRAM) 50 MG tablet Take 1-2 tablets (50-100 mg total) by mouth every 6 (six) hours as needed for moderate pain or severe pain. Take 50 mg by mouth every 6-8 hours  PRN for moderate pain. You can take 2 tabs for severe pain. 30 tablet 0  . vitamin E 100 UNIT capsule Take 100 Units by mouth at bedtime.      No current facility-administered medications for this visit.        Physical Exam: BP 124/65   Pulse 85   Resp 20   Ht 5\' 11"  (1.803 m)   Wt 173 lb (78.5 kg)   SpO2 95% Comment: 2L O2 per [place]  BMI 24.13 kg/m   General appearance: alert, cooperative, appears older than stated age and no distress Neurologic: intact Heart: regular rate and rhythm, S1, S2 normal, no murmur, click, rub or gallop Lungs: diminished breath sounds bibasilar Abdomen: soft, non-tender; bowel sounds normal; no masses,  no organomegaly Extremities: extremities normal, atraumatic, no cyanosis or edema and Homans sign is negative, no sign of DVT Wound: Sternum is stable and well-healed   Diagnostic Studies & Laboratory data:     Recent Radiology Findings:  Nm Pet Image Initial (pi) Skull Base To Thigh  Result Date: 09/09/2018 CLINICAL DATA:  Initial treatment strategy for small cell lung cancer. EXAM: NUCLEAR MEDICINE PET SKULL BASE TO THIGH TECHNIQUE: 8.9 mCi F-18 FDG was injected intravenously. Full-ring PET imaging was performed from the skull base to thigh after the radiotracer. CT data was obtained and used for attenuation correction and anatomic localization. Fasting blood glucose: 113 mg/dl COMPARISON:  Portable chest 08/15/2018.  Chest CT 08/13/2018 FINDINGS: Mediastinal blood pool activity: SUV max 1.8 NECK: No hypermetabolic cervical lymph nodes are  identified.There are no lesions of the pharyngeal mucosal space. Incidental CT findings: Bilateral carotid atherosclerosis. CHEST: There are several hypermetabolic right hilar and right mediastinal lymph nodes. Right paratracheal node measuring 2.9 x 2.5 cm on image 68/4 has an SUV max of 6.6. 2.3 x 3.1 cm nodule anterior to the right hilum on image 74/4 has an SUV max of 9.4. There is no axillary or contralateral hilar adenopathy. There are no peripheral hypermetabolic pulmonary lesions. There is no abnormal activity within a moderate size dependent right pleural effusion. Incidental CT findings: There is interval improved aeration of the right upper lobe with mild residual atelectasis. There is mild dependent atelectasis in the right lower lobe. Underlying moderate centrilobular emphysema and diffuse atherosclerosis are noted post CABG. ABDOMEN/PELVIS: There is no hypermetabolic activity within the liver, adrenal glands, spleen or pancreas. 13 mm portacaval node on image 323/5 is hypermetabolic with an SUV max of 6.2. No other hypermetabolic abdominopelvic lymph nodes are demonstrated. 16 mm exophytic lesion projecting anteriorly from the right kidney (image 137/4) measures 16 HU and appears mildly hypermetabolic (SUV max 4.8). Incidental CT findings: In addition to the small hypermetabolic right renal lesion, there are additional renal lesions bilaterally which are not hypermetabolic, including a 3.3 cm cyst posteriorly in the mid right kidney. 16 mm exophytic lesion on the left (image 134/4) is not hypermetabolic. There is diffuse aortic and branch vessel atherosclerosis with a 3.8 cm abdominal aortic aneurysm. Diverticular changes are present within the sigmoid colon. SKELETON: There is diffuse hypermetabolic activity throughout the skeleton, suspicious for widespread osseous metastatic disease. For example, in the L3 vertebral body, the SUV max is 16.3. No focal lytic lesion or pathologic fracture is  identified. There are relative areas of sparing in the spine which are attributed to underlying hemangiomas. Incidental CT findings: none IMPRESSION: 1. Right hilar and mediastinal hypermetabolic lymphadenopathy consistent with small cell lung cancer. No peripheral hypermetabolic pulmonary activity identified. 2.  Single hypermetabolic portacaval node, suspicious for metastatic disease. 3. Widespread osseous hypermetabolic activity consistent with metastatic disease. 4. Indeterminate small hypermetabolic right renal lesion, also suspicious for neoplasm. This could reflect metastatic disease or renal cell carcinoma. Attention on follow-up after treatment of the patient's small cell lung cancer recommended. 5. Interval improved aeration of the right upper lobe. Moderate right pleural effusion without hypermetabolic activity. 6. Extensive Aortic Atherosclerosis (ICD10-I70.0). 3.8 cm abdominal aortic aneurysm. Aortic Atherosclerosis (ICD10-I70.0) and Emphysema (ICD10-J43.9). Electronically Signed   By: Richardean Sale M.D.   On: 09/09/2018 17:19     Recent Lab Findings: Lab Results  Component Value Date   WBC 30.3 (H) 09/12/2018   HGB 10.3 (L) 09/12/2018   HCT 32.5 (L) 09/12/2018   PLT 117 (L) 09/12/2018   GLUCOSE 170 (H) 09/12/2018   CHOL 170 11/04/2013   TRIG 166 (H) 11/04/2013   HDL 34 (L) 11/04/2013   LDLDIRECT 69.3 11/26/2008   LDLCALC 103 (H) 11/04/2013   ALT 23 09/12/2018   AST 23 09/12/2018   NA 137 09/12/2018   K 3.9 09/12/2018   CL 98 09/12/2018   CREATININE 1.60 (H) 09/12/2018   BUN 16 09/12/2018   CO2 29 09/12/2018   TSH 0.293 (L) 09/12/2018   INR 1.35 08/01/2018   HGBA1C 6.4 (H) 07/31/2018      Assessment / Plan:   Stable status post coronary artery bypass grafting, wounds healing appropriately Continues on home oxygen-since he is constantly traveling back and forth to the cancer center for treatments he requested a portable concentrator-will contact home nursing services to  arrange.  Continues to be followed in the cancer center plan to see him back as necessary    Grace Isaac MD      [address] [place] [address] Office [phone]   Beeper [phone]  09/15/2018 5:30 PM

## 2018-09-15 NOTE — Patient Outreach (Signed)
Transition of care/ case closure:  Placed call to patient who reports that he is doing well. Reports heart failure is under good control. Reports weight unchanged.  States breathing is at his normal. Continues to wear oxygen 24/7.  Denies any new concerns at this time.  Reports continuing to get treatment at the cancer center.  PLAN: Reviewed case closure with patient since he has met his goals. He is in agreement to close case. Will mail case closure letter. Encouraged patient to call in the future if he has needs. Reviewed with patient how to get a different portable oxygen unit for car by calling [facility] ( this company is already established with patient).  Will send case closure letter to MD.  Tomasa Rand, RN, BSN, CEN [place] Coordinator [phone]

## 2018-09-16 ENCOUNTER — Ambulatory Visit: Payer: Medicare HMO

## 2018-09-16 ENCOUNTER — Ambulatory Visit: Payer: BLUE CROSS/BLUE SHIELD | Admitting: Physician Assistant

## 2018-09-19 ENCOUNTER — Inpatient Hospital Stay: Payer: BLUE CROSS/BLUE SHIELD

## 2018-09-19 ENCOUNTER — Ambulatory Visit: Payer: Medicare HMO

## 2018-09-19 ENCOUNTER — Inpatient Hospital Stay (HOSPITAL_BASED_OUTPATIENT_CLINIC_OR_DEPARTMENT_OTHER): Payer: BLUE CROSS/BLUE SHIELD | Admitting: Oncology

## 2018-09-19 ENCOUNTER — Encounter: Payer: Self-pay | Admitting: Oncology

## 2018-09-19 VITALS — BP 103/45 | HR 77 | Temp 97.8°F | Resp 18 | Ht 71.0 in | Wt 170.9 lb

## 2018-09-19 DIAGNOSIS — Z7689 Persons encountering health services in other specified circumstances: Secondary | ICD-10-CM

## 2018-09-19 DIAGNOSIS — C3491 Malignant neoplasm of unspecified part of right bronchus or lung: Secondary | ICD-10-CM

## 2018-09-19 DIAGNOSIS — C3490 Malignant neoplasm of unspecified part of unspecified bronchus or lung: Secondary | ICD-10-CM

## 2018-09-19 DIAGNOSIS — I255 Ischemic cardiomyopathy: Secondary | ICD-10-CM

## 2018-09-19 DIAGNOSIS — I252 Old myocardial infarction: Secondary | ICD-10-CM

## 2018-09-19 DIAGNOSIS — I2510 Atherosclerotic heart disease of native coronary artery without angina pectoris: Secondary | ICD-10-CM

## 2018-09-19 DIAGNOSIS — E119 Type 2 diabetes mellitus without complications: Secondary | ICD-10-CM

## 2018-09-19 DIAGNOSIS — R5382 Chronic fatigue, unspecified: Secondary | ICD-10-CM

## 2018-09-19 DIAGNOSIS — Z7901 Long term (current) use of anticoagulants: Secondary | ICD-10-CM

## 2018-09-19 DIAGNOSIS — C3411 Malignant neoplasm of upper lobe, right bronchus or lung: Secondary | ICD-10-CM | POA: Diagnosis not present

## 2018-09-19 DIAGNOSIS — I110 Hypertensive heart disease with heart failure: Secondary | ICD-10-CM

## 2018-09-19 DIAGNOSIS — E871 Hypo-osmolality and hyponatremia: Secondary | ICD-10-CM

## 2018-09-19 DIAGNOSIS — Z79899 Other long term (current) drug therapy: Secondary | ICD-10-CM

## 2018-09-19 DIAGNOSIS — N289 Disorder of kidney and ureter, unspecified: Secondary | ICD-10-CM | POA: Insufficient documentation

## 2018-09-19 DIAGNOSIS — Z5111 Encounter for antineoplastic chemotherapy: Secondary | ICD-10-CM

## 2018-09-19 DIAGNOSIS — J439 Emphysema, unspecified: Secondary | ICD-10-CM

## 2018-09-19 DIAGNOSIS — Z5112 Encounter for antineoplastic immunotherapy: Secondary | ICD-10-CM

## 2018-09-19 DIAGNOSIS — Z86010 Personal history of colonic polyps: Secondary | ICD-10-CM

## 2018-09-19 DIAGNOSIS — Z7982 Long term (current) use of aspirin: Secondary | ICD-10-CM

## 2018-09-19 DIAGNOSIS — I4891 Unspecified atrial fibrillation: Secondary | ICD-10-CM | POA: Diagnosis not present

## 2018-09-19 DIAGNOSIS — E785 Hyperlipidemia, unspecified: Secondary | ICD-10-CM

## 2018-09-19 DIAGNOSIS — C7989 Secondary malignant neoplasm of other specified sites: Secondary | ICD-10-CM

## 2018-09-19 DIAGNOSIS — I6523 Occlusion and stenosis of bilateral carotid arteries: Secondary | ICD-10-CM

## 2018-09-19 DIAGNOSIS — I714 Abdominal aortic aneurysm, without rupture: Secondary | ICD-10-CM

## 2018-09-19 DIAGNOSIS — D649 Anemia, unspecified: Secondary | ICD-10-CM

## 2018-09-19 DIAGNOSIS — C781 Secondary malignant neoplasm of mediastinum: Secondary | ICD-10-CM

## 2018-09-19 DIAGNOSIS — J90 Pleural effusion, not elsewhere classified: Secondary | ICD-10-CM

## 2018-09-19 DIAGNOSIS — I480 Paroxysmal atrial fibrillation: Secondary | ICD-10-CM

## 2018-09-19 LAB — CMP (CANCER CENTER ONLY)
ALT: 24 U/L (ref 0–44)
ANION GAP: 11 (ref 5–15)
AST: 20 U/L (ref 15–41)
Albumin: 3.7 g/dL (ref 3.5–5.0)
Alkaline Phosphatase: 106 U/L (ref 38–126)
BILIRUBIN TOTAL: 0.4 mg/dL (ref 0.3–1.2)
BUN: 19 mg/dL (ref 8–23)
CALCIUM: 9.5 mg/dL (ref 8.9–10.3)
CHLORIDE: 101 mmol/L (ref 98–111)
CO2: 27 mmol/L (ref 22–32)
Creatinine: 1.54 mg/dL — ABNORMAL HIGH (ref 0.61–1.24)
GFR, EST AFRICAN AMERICAN: 48 mL/min — AB (ref >60)
GFR, Estimated: 42 mL/min — ABNORMAL LOW (ref >60)
Glucose, Bld: 146 mg/dL — ABNORMAL HIGH (ref 70–99)
Potassium: 3.9 mmol/L (ref 3.5–5.1)
Sodium: 139 mmol/L (ref 135–145)
Total Protein: 7.2 g/dL (ref 6.5–8.1)

## 2018-09-19 LAB — CBC WITH DIFFERENTIAL (CANCER CENTER ONLY)
Abs Immature Granulocytes: 0.19 10*3/uL — ABNORMAL HIGH (ref 0.00–0.07)
BASOS ABS: 0.1 10*3/uL (ref 0.0–0.1)
Basophils Relative: 1 %
EOS ABS: 0.1 10*3/uL (ref 0.0–0.5)
Eosinophils Relative: 1 %
HEMATOCRIT: 34.9 % — AB (ref 39.0–52.0)
Hemoglobin: 11 g/dL — ABNORMAL LOW (ref 13.0–17.0)
Immature Granulocytes: 1 %
LYMPHS ABS: 1.5 10*3/uL (ref 0.7–4.0)
LYMPHS PCT: 11 %
MCH: 28.7 pg (ref 26.0–34.0)
MCHC: 31.5 g/dL (ref 30.0–36.0)
MCV: 91.1 fL (ref 80.0–100.0)
Monocytes Absolute: 1 10*3/uL (ref 0.1–1.0)
Monocytes Relative: 7 %
NEUTROS PCT: 79 %
NRBC: 0.2 % (ref 0.0–0.2)
Neutro Abs: 10.4 10*3/uL — ABNORMAL HIGH (ref 1.7–7.7)
Platelet Count: 265 10*3/uL (ref 150–400)
RBC: 3.83 MIL/uL — AB (ref 4.22–5.81)
RDW: 15.8 % — AB (ref 11.5–15.5)
WBC Count: 13.2 10*3/uL — ABNORMAL HIGH (ref 4.0–10.5)

## 2018-09-19 LAB — TSH: TSH: 0.362 u[IU]/mL (ref 0.320–4.118)

## 2018-09-19 MED ORDER — SODIUM CHLORIDE 0.9 % IV SOLN
96.0000 mg/m2 | Freq: Once | INTRAVENOUS | Status: AC
  Administered 2018-09-19: 200 mg via INTRAVENOUS
  Filled 2018-09-19: qty 10

## 2018-09-19 MED ORDER — SODIUM CHLORIDE 0.9 % IV SOLN
Freq: Once | INTRAVENOUS | Status: AC
  Administered 2018-09-19: 12:00:00 via INTRAVENOUS
  Filled 2018-09-19: qty 250

## 2018-09-19 MED ORDER — SODIUM CHLORIDE 0.9 % IV SOLN
350.0000 mg | Freq: Once | INTRAVENOUS | Status: AC
  Administered 2018-09-19: 350 mg via INTRAVENOUS
  Filled 2018-09-19: qty 35

## 2018-09-19 MED ORDER — SODIUM CHLORIDE 0.9 % IV SOLN
Freq: Once | INTRAVENOUS | Status: AC
  Administered 2018-09-19: 13:00:00 via INTRAVENOUS
  Filled 2018-09-19: qty 5

## 2018-09-19 MED ORDER — SODIUM CHLORIDE 0.9 % IV SOLN
1200.0000 mg | Freq: Once | INTRAVENOUS | Status: AC
  Administered 2018-09-19: 1200 mg via INTRAVENOUS
  Filled 2018-09-19: qty 20

## 2018-09-19 MED ORDER — PALONOSETRON HCL INJECTION 0.25 MG/5ML
0.2500 mg | Freq: Once | INTRAVENOUS | Status: AC
  Administered 2018-09-19: 0.25 mg via INTRAVENOUS

## 2018-09-19 MED ORDER — PALONOSETRON HCL INJECTION 0.25 MG/5ML
INTRAVENOUS | Status: AC
  Filled 2018-09-19: qty 5

## 2018-09-19 NOTE — Progress Notes (Signed)
09/19/2018 per Mikey Bussing, NP ok to treat with Crt level today.

## 2018-09-19 NOTE — Progress Notes (Signed)
[place], MD [place] [address]  DIAGNOSIS: Extensive stage (T1 a, N2, M1a)extensive stage small cell lung cancer presented with right lung nodules in addition to mediastinal lymphadenopathy as well as pericardial metastasis diagnosed in October 2019  PRIOR THERAPY: None.  CURRENT THERAPY: palliative systemic chemotherapy with carboplatin for AUC of 5 on day 1, etoposide 100 mg/M2 on days 1, 2 and 3 in addition to Tecentriq (Atezolizumab) 1200 mg IV every 3 weeks.  First dose given on 08/29/2018.  Status post 1 cycle.  INTERVAL HISTORY: Gregory Bates 77 y.o. male returns for routine follow-up visit accompanied by his wife.  The patient is feeling fine today and has no specific complaints except for his baseline shortness of breath.  He continues to wear home oxygen at 2 L/min.  He reports that his breathing may have improved some.  Denies fevers and chills.  Denies chest pain, cough, hemoptysis.  Denies nausea, vomiting, constipation, diarrhea.  Denies recent weight loss or night sweats.  The patient is here for evaluation prior to cycle #2 of his chemotherapy.  MEDICAL HISTORY: Past Medical History:  Diagnosis Date  . Acute renal insufficiency    a. During 11/2013 adm with atrial flutter.  . Anemia, unspecified   . Atrial fibrillation ([place])   . CAD (coronary artery disease), native coronary artery    a. s/p cath May 2011 >> DES distal RCA;  b. 01/2013 NSTEMI >> OM1 99p (2.75x12 Promus Premier DES);  c. LHC (2/15): PCI: Promus DES to dist RCA ;  d. LHC 1/16 - dLM 52, oLAD 30, small D1 tandem 71, pOM1 stent ok, mOM 50-60, dOM bifurcation 50-60 in both vessels, AV 51-70 jailed by stent, pRCA 52 mRCA stent ok, dRCA stent ok, PLB smal 50, dPDA 60, 80; EF 35-40 >> med Rx - PCI of PDA if angina  . Chronic combined systolic and diastolic CHF (congestive heart failure) ([place]) 12/23/2015   Echo 2/17: Mild LVH, EF 45-50%,  inferior akinesis, grade 2 diastolic dysfunction, mild AI, mild MR, mild LAE, PASP 44 mmHg   . Colorectal polyps 2002  . Diabetes mellitus    AODM  . Erectile dysfunction   . History of echocardiogram    a. 01/2013 Echo: EF 55%, mid-dist inflat HK, Gr1 DD, Triv AI/TR, Mild MR.;  b.  Echo (1/15): Mild LVH, EF 55%, inferolateral hypokinesis, grade 1 diastolic dysfunction, mild AI, trivial MR, moderate LAE, PASP 36 mmHg   . Hx of cardiovascular stress test    Stress myoview 08/07/13 with LVEF 44%, inferior scar, no ischemia  . Hyperlipidemia   . Hypertension   . Hyponatremia   . Ischemic cardiomyopathy    a. EF 35-40% by LHC in 1/16; b. Echo 2/17: Mild LVH, EF 45-50%, inferior akinesis, grade 2 diastolic dysfunction, mild AI, mild MR, mild LAE, PASP 44 mmHg  . Myocardial infarction ([place])   . Oxygen deficiency   . PAF (paroxysmal atrial fibrillation) ([place])    a. Dx 11/2013. Spont conv. Placed on eliquis.  . Small cell lung cancer in adult [facility]) 07/28/2018    ALLERGIES:  is allergic to brilinta [ticagrelor] and crestor [rosuvastatin].  MEDICATIONS:  Current Outpatient Medications  Medication Sig Dispense Refill  . acetaminophen (TYLENOL) 325 MG tablet Take 2 tablets (650 mg total) by mouth every 6 (six) hours as needed for mild pain.    [name] Kitchen albuterol (PROVENTIL HFA;VENTOLIN HFA) 108 (90 Base) MCG/ACT inhaler  Inhale 2 puffs into the lungs every 6 (six) hours as needed for wheezing or shortness of breath. 1 Inhaler 2  . amiodarone (PACERONE) 200 MG tablet Take 1 tablet (200 mg total) by mouth 2 (two) times daily. For one week;then take 200 mg daily thereafter (Patient taking differently: Take 200 mg by mouth See admin instructions. Take 200mg  by mouth twice daily for one week;then take 200 mg daily thereafter) 90 tablet 3  . aspirin EC 81 MG EC tablet Take 1 tablet (81 mg total) by mouth daily.    [name] Kitchen atorvastatin (LIPITOR) 20 MG tablet Take 1 tablet (20 mg total) by mouth daily. 90 tablet 3  .  cholecalciferol (VITAMIN D) 1000 UNITS tablet Take 1,000 Units by mouth every evening.     . clopidogrel (PLAVIX) 75 MG tablet     . cyclobenzaprine (FLEXERIL) 5 MG tablet Take 1 tablet (5 mg total) by mouth 2 (two) times daily as needed for muscle spasms (cramping). 30 tablet 0  . ELIQUIS 5 MG TABS tablet TAKE 1 TABLET BY MOUTH TWICE DAILY (Patient taking differently: Take 5 mg by mouth 2 (two) times daily. ) 180 tablet 3  . fexofenadine (ALLEGRA) 180 MG tablet Take 180 mg by mouth daily as needed for allergies.     . fluticasone (FLONASE) 50 MCG/ACT nasal spray Place 1 spray into both nostrils daily as needed for allergies.     . furosemide (LASIX) 40 MG tablet Take 1 tablet (40 mg total) by mouth 2 (two) times daily. 60 tablet 3  . guaiFENesin (MUCINEX) 600 MG 12 hr tablet Take 1 tablet (600 mg total) by mouth 2 (two) times daily as needed for cough or to loosen phlegm.    . isosorbide mononitrate (IMDUR) 60 MG 24 hr tablet Take 60 mg by mouth daily.    . metoprolol tartrate (LOPRESSOR) 25 MG tablet Take 1 tablet (25 mg total) by mouth 2 (two) times daily. 180 tablet 3  . Multiple Vitamin (MULTIVITAMIN) tablet Take 1 tablet by mouth at bedtime.     . ondansetron (ZOFRAN ODT) 4 MG disintegrating tablet Take 1 tablet (4 mg total) by mouth every 8 (eight) hours as needed for nausea or vomiting. 20 tablet 0  . prochlorperazine (COMPAZINE) 10 MG tablet Take 1 tablet (10 mg total) by mouth every 6 (six) hours as needed for nausea or vomiting. 30 tablet 0  . traMADol (ULTRAM) 50 MG tablet Take 1-2 tablets (50-100 mg total) by mouth every 6 (six) hours as needed for moderate pain or severe pain. Take 50 mg by mouth every 6-8 hours PRN for moderate pain. You can take 2 tabs for severe pain. 30 tablet 0  . vitamin E 100 UNIT capsule Take 100 Units by mouth at bedtime.      No current facility-administered medications for this visit.    Facility-Administered Medications Ordered in Other Visits  Medication  Dose Route Frequency Provider Last Rate Last Dose  . atezolizumab (TECENTRIQ) 1,200 mg in sodium chloride 0.9 % 250 mL chemo infusion  1,200 mg Intravenous Once Curt Bears, MD 270 mL/hr at 09/19/18 1321 1,200 mg at 09/19/18 1321  . CARBOplatin (PARAPLATIN) 350 mg in sodium chloride 0.9 % 250 mL chemo infusion  350 mg Intravenous Once Curt Bears, MD      . etoposide (VEPESID) 200 mg in sodium chloride 0.9 % 500 mL chemo infusion  96 mg/m2 (Treatment Plan Recorded) Intravenous Once Curt Bears, MD  SURGICAL HISTORY:  Past Surgical History:  Procedure Laterality Date  . BIOPSY  08/01/2018   Procedure: BIOPSY OF PERICARDIAL LYMPH NODE AND HILAR LYMPH NODE;  Surgeon: Grace Isaac, MD;  Location: [place];  Service: Open Heart Surgery;;  . CLIPPING OF ATRIAL APPENDAGE Left 08/01/2018   Procedure: CLIPPING OF ATRIAL APPENDAGE;  Surgeon: Grace Isaac, MD;  Location: [place];  Service: Open Heart Surgery;  Laterality: Left;  . COLONOSCOPY W/ POLYPECTOMY  2002   [facility]  . COLONOSCOPY W/ POLYPECTOMY  2004; 2007 negative  . CORONARY ANGIOPLASTY  12/26/2013  . CORONARY ARTERY BYPASS GRAFT N/A 08/01/2018   Procedure: CORONARY ARTERY BYPASS GRAFTING (CABG) TIMES THREE USING LEFT INTERNAL MAMMARY ARTERY TO LAD, RIGHT GREATER SAPHENOUS VEIN GRAFT TO OM AND RCA, VEIN HARVESTED ENDOSCOPICALLY;  Surgeon: Grace Isaac, MD;  Location: [place];  Service: Open Heart Surgery;  Laterality: N/A;  . CORONARY STENT PLACEMENT  2007, 2011  . infantile paralysis facial asymmetry    . LEFT HEART CATH AND CORONARY ANGIOGRAPHY N/A 05/18/2017   Procedure: Left Heart Cath and Coronary Angiography;  Surgeon: Martinique, Peter M, MD;  Location: [place] CV LAB;  Service: Cardiovascular;  Laterality: N/A;  . LEFT HEART CATH AND CORONARY ANGIOGRAPHY N/A 07/27/2018   Procedure: LEFT HEART CATH AND CORONARY ANGIOGRAPHY;  Surgeon: Martinique, Peter M, MD;  Location: [place] CV LAB;  Service:  Cardiovascular;  Laterality: N/A;  . LEFT HEART CATHETERIZATION WITH CORONARY ANGIOGRAM N/A 10/02/2012   Procedure: LEFT HEART CATHETERIZATION WITH CORONARY ANGIOGRAM;  Surgeon: Peter M Martinique, MD;  Location: [facility] CATH LAB;  Service: Cardiovascular;  Laterality: N/A;  . LEFT HEART CATHETERIZATION WITH CORONARY ANGIOGRAM N/A 02/20/2013   Procedure: LEFT HEART CATHETERIZATION WITH CORONARY ANGIOGRAM;  Surgeon: Burnell Blanks, MD;  Location: [facility] CATH LAB;  Service: Cardiovascular;  Laterality: N/A;  . LEFT HEART CATHETERIZATION WITH CORONARY ANGIOGRAM N/A 12/26/2013   Procedure: LEFT HEART CATHETERIZATION WITH CORONARY ANGIOGRAM;  Surgeon: Burnell Blanks, MD;  Location: [facility] CATH LAB;  Service: Cardiovascular;  Laterality: N/A;  . LEFT HEART CATHETERIZATION WITH CORONARY ANGIOGRAM N/A 11/28/2014   Procedure: LEFT HEART CATHETERIZATION WITH CORONARY ANGIOGRAM;  Surgeon: Burnell Blanks, MD;  Location: [facility] CATH LAB;  Service: Cardiovascular;  Laterality: N/A;  . PERCUTANEOUS CORONARY STENT INTERVENTION (PCI-S)  10/02/2012   Procedure: PERCUTANEOUS CORONARY STENT INTERVENTION (PCI-S);  Surgeon: Peter M Martinique, MD;  Location: [facility] CATH LAB;  Service: Cardiovascular;;  . PERCUTANEOUS CORONARY STENT INTERVENTION (PCI-S)  02/20/2013   Procedure: PERCUTANEOUS CORONARY STENT INTERVENTION (PCI-S);  Surgeon: Burnell Blanks, MD;  Location: [facility] CATH LAB;  Service: Cardiovascular;;  . PERCUTANEOUS CORONARY STENT INTERVENTION (PCI-S)  12/26/2013   Procedure: PERCUTANEOUS CORONARY STENT INTERVENTION (PCI-S);  Surgeon: Burnell Blanks, MD;  Location: [facility] CATH LAB;  Service: Cardiovascular;;  . TEE WITHOUT CARDIOVERSION N/A 08/01/2018   Procedure: TRANSESOPHAGEAL ECHOCARDIOGRAM (TEE);  Surgeon: Grace Isaac, MD;  Location: [place];  Service: Open Heart Surgery;  Laterality: N/A;  . WISDOM TOOTH EXTRACTION      REVIEW OF SYSTEMS:   Review of Systems  Constitutional: Negative for appetite  change, chills, fatigue, fever and unexpected weight change.  HENT:   Negative for mouth sores, nosebleeds, sore throat and trouble swallowing.   Eyes: Negative for eye problems and icterus.  Respiratory: Negative for cough, hemoptysis, and wheezing.  He has his baseline shortness of breath and reports that his breathing is somewhat improved.  Wears home oxygen at 2  L/min. Cardiovascular: Negative for chest pain and leg swelling.  Gastrointestinal: Negative for abdominal pain, constipation, diarrhea, nausea and vomiting.  Genitourinary: Negative for bladder incontinence, difficulty urinating, dysuria, frequency and hematuria.   Musculoskeletal: Negative for back pain, gait problem, neck pain and neck stiffness.  Skin: Negative for itching and rash.  Neurological: Negative for dizziness, extremity weakness, gait problem, headaches, light-headedness and seizures.  Hematological: Negative for adenopathy. Does not bruise/bleed easily.  Psychiatric/Behavioral: Negative for confusion, depression and sleep disturbance. The patient is not nervous/anxious.     PHYSICAL EXAMINATION:  Blood pressure (!) 103/45, pulse 77, temperature 97.8 F (36.6 C), temperature source Oral, resp. rate 18, height 5\' 11"  (1.803 m), weight 170 lb 14.4 oz (77.5 kg), SpO2 100 %.  ECOG PERFORMANCE STATUS: 1 - Symptomatic but completely ambulatory  Physical Exam  Constitutional: Oriented to person, place, and time and well-developed, well-nourished, and in no distress. No distress.  HENT:  Head: Normocephalic and atraumatic.  Mouth/Throat: Oropharynx is clear and moist. No oropharyngeal exudate.  Eyes: Conjunctivae are normal. Right eye exhibits no discharge. Left eye exhibits no discharge. No scleral icterus.  Neck: Normal range of motion. Neck supple.  Cardiovascular: Normal rate, regular rhythm, normal heart sounds and intact distal pulses.   Pulmonary/Chest: Effort normal and breath sounds normal. No respiratory  distress. No wheezes. No rales.  Abdominal: Soft. Bowel sounds are normal. Exhibits no distension and no mass. There is no tenderness.  Musculoskeletal: Normal range of motion. Exhibits no edema.  Lymphadenopathy:    No cervical adenopathy.  Neurological: Alert and oriented to person, place, and time. Exhibits normal muscle tone. Gait normal. Coordination normal.  Skin: Skin is warm and dry. No rash noted. Not diaphoretic. No erythema. No pallor.  Psychiatric: Mood, memory and judgment normal.  Vitals reviewed.  LABORATORY DATA: Lab Results  Component Value Date   WBC 13.2 (H) 09/19/2018   HGB 11.0 (L) 09/19/2018   HCT 34.9 (L) 09/19/2018   MCV 91.1 09/19/2018   PLT 265 09/19/2018      Chemistry      Component Value Date/Time   NA 139 09/19/2018 1050   NA 126 (L) 08/24/2018 1237   K 3.9 09/19/2018 1050   CL 101 09/19/2018 1050   CO2 27 09/19/2018 1050   BUN 19 09/19/2018 1050   BUN 19 08/24/2018 1237   CREATININE 1.54 (H) 09/19/2018 1050   CREATININE 1.67 (H) 10/30/2016 1033      Component Value Date/Time   CALCIUM 9.5 09/19/2018 1050   ALKPHOS 106 09/19/2018 1050   AST 20 09/19/2018 1050   ALT 24 09/19/2018 1050   BILITOT 0.4 09/19/2018 1050       RADIOGRAPHIC STUDIES:  Dg Chest 2 View  Result Date: 09/15/2018 CLINICAL DATA:  History of prior coronary bypass grafting EXAM: CHEST - 2 VIEW COMPARISON:  08/15/2018 FINDINGS: Cardiac shadow is within normal limits. Postsurgical changes are again seen and stable. The lungs are well aerated bilaterally with significant improved aeration in the right upper lobe when compared with the prior exam. Decreased bulky soft tissue density in the right paratracheal region is noted as well. Minimal blunting of the posterior costophrenic angles is noted likely related to small effusion. The bony structures show degenerative change of the thoracic spine. IMPRESSION: Small pleural effusions bilaterally. Postoperative changes are again  identified and stable. Improved aeration in the right upper lobe when compare with the prior exam. Decrease in soft tissue prominence in the right paratracheal region when compare  with the prior study. Electronically Signed   By: Inez Catalina M.D.   On: 09/15/2018 14:12   Nm Pet Image Initial (pi) Skull Base To Thigh  Result Date: 09/09/2018 CLINICAL DATA:  Initial treatment strategy for small cell lung cancer. EXAM: NUCLEAR MEDICINE PET SKULL BASE TO THIGH TECHNIQUE: 8.9 mCi F-18 FDG was injected intravenously. Full-ring PET imaging was performed from the skull base to thigh after the radiotracer. CT data was obtained and used for attenuation correction and anatomic localization. Fasting blood glucose: 113 mg/dl COMPARISON:  Portable chest 08/15/2018.  Chest CT 08/13/2018 FINDINGS: Mediastinal blood pool activity: SUV max 1.8 NECK: No hypermetabolic cervical lymph nodes are identified.There are no lesions of the pharyngeal mucosal space. Incidental CT findings: Bilateral carotid atherosclerosis. CHEST: There are several hypermetabolic right hilar and right mediastinal lymph nodes. Right paratracheal node measuring 2.9 x 2.5 cm on image 68/4 has an SUV max of 6.6. 2.3 x 3.1 cm nodule anterior to the right hilum on image 74/4 has an SUV max of 9.4. There is no axillary or contralateral hilar adenopathy. There are no peripheral hypermetabolic pulmonary lesions. There is no abnormal activity within a moderate size dependent right pleural effusion. Incidental CT findings: There is interval improved aeration of the right upper lobe with mild residual atelectasis. There is mild dependent atelectasis in the right lower lobe. Underlying moderate centrilobular emphysema and diffuse atherosclerosis are noted post CABG. ABDOMEN/PELVIS: There is no hypermetabolic activity within the liver, adrenal glands, spleen or pancreas. 13 mm portacaval node on image 354/6 is hypermetabolic with an SUV max of 6.2. No other  hypermetabolic abdominopelvic lymph nodes are demonstrated. 16 mm exophytic lesion projecting anteriorly from the right kidney (image 137/4) measures 16 HU and appears mildly hypermetabolic (SUV max 4.8). Incidental CT findings: In addition to the small hypermetabolic right renal lesion, there are additional renal lesions bilaterally which are not hypermetabolic, including a 3.3 cm cyst posteriorly in the mid right kidney. 16 mm exophytic lesion on the left (image 134/4) is not hypermetabolic. There is diffuse aortic and branch vessel atherosclerosis with a 3.8 cm abdominal aortic aneurysm. Diverticular changes are present within the sigmoid colon. SKELETON: There is diffuse hypermetabolic activity throughout the skeleton, suspicious for widespread osseous metastatic disease. For example, in the L3 vertebral body, the SUV max is 16.3. No focal lytic lesion or pathologic fracture is identified. There are relative areas of sparing in the spine which are attributed to underlying hemangiomas. Incidental CT findings: none IMPRESSION: 1. Right hilar and mediastinal hypermetabolic lymphadenopathy consistent with small cell lung cancer. No peripheral hypermetabolic pulmonary activity identified. 2. Single hypermetabolic portacaval node, suspicious for metastatic disease. 3. Widespread osseous hypermetabolic activity consistent with metastatic disease. 4. Indeterminate small hypermetabolic right renal lesion, also suspicious for neoplasm. This could reflect metastatic disease or renal cell carcinoma. Attention on follow-up after treatment of the patient's small cell lung cancer recommended. 5. Interval improved aeration of the right upper lobe. Moderate right pleural effusion without hypermetabolic activity. 6. Extensive Aortic Atherosclerosis (ICD10-I70.0). 3.8 cm abdominal aortic aneurysm. Aortic Atherosclerosis (ICD10-I70.0) and Emphysema (ICD10-J43.9). Electronically Signed   By: Richardean Sale M.D.   On: 09/09/2018  17:19     ASSESSMENT/PLAN:  Extensive stage primary small cell carcinoma of lung (HCC) This is a very pleasant 77 year old white male recently diagnosed with extensive stage small cell lung cancer.  He is currently on treatment with carboplatin for an AUC of 5, etoposide 100 mg meter squared, and Tecentriq 1200  mg IV every 3 weeks.  Status post 1 cycle.  He tolerated the first cycle well overall with no concerning complaints.  Labs from today have been reviewed.  Recommend for him to proceed with cycle 2 as scheduled.  Creatinine today is 1.54 which is consistent with his baseline.  Okay to proceed with treatment with a slightly elevated creatinine.  He will have weekly labs.  The patient will follow-up in 3 weeks for evaluation prior to cycle #3.  We will order a restaging CT scan of the chest, abdomen, pelvis after 3 cycles.  He had a PET scan performed just after starting cycle #1 which was reviewed with him today.  He was advised to call immediately if he has any concerning symptoms in the interval. The patient voices understanding of current disease status and treatment options and is in agreement with the current care plan.  All questions were answered. The patient knows to call the clinic with any problems, questions or concerns. We can certainly see the patient much sooner if necessary.   No orders of the defined types were placed in this encounter.    Mikey Bussing, DNP, AGPCNP-BC, AOCNP 09/19/18

## 2018-09-19 NOTE — Progress Notes (Signed)
CrCl = 44 mL/min.  Carbo dose calculates to 350 mg today.  I s/w Dr. Julien Nordmann and he was ok for dose to be adjusted down from 380 mg. Kennith Center, Pharm.D., CPP 09/19/2018@12 :43 PM

## 2018-09-19 NOTE — Patient Instructions (Signed)
[place] Discharge Instructions for Patients Receiving Chemotherapy  Today you received the following chemotherapy agents Tecentriq, Carboplatin and Etoposide  To help prevent nausea and vomiting after your treatment, we encourage you to take your nausea medication as directed.    If you develop nausea and vomiting that is not controlled by your nausea medication, call the clinic.   BELOW ARE SYMPTOMS THAT SHOULD BE REPORTED IMMEDIATELY:  *FEVER GREATER THAN 100.5 F  *CHILLS WITH OR WITHOUT FEVER  NAUSEA AND VOMITING THAT IS NOT CONTROLLED WITH YOUR NAUSEA MEDICATION  *UNUSUAL SHORTNESS OF BREATH  *UNUSUAL BRUISING OR BLEEDING  TENDERNESS IN MOUTH AND THROAT WITH OR WITHOUT PRESENCE OF ULCERS  *URINARY PROBLEMS  *BOWEL PROBLEMS  UNUSUAL RASH Items with * indicate a potential emergency and should be followed up as soon as possible.  Feel free to call the clinic should you have any questions or concerns. The clinic phone number is [phone].  Please show the [place] at check-in to the Emergency Department and triage nurse.

## 2018-09-19 NOTE — Assessment & Plan Note (Signed)
This is a very pleasant 77 year old white male recently diagnosed with extensive stage small cell lung cancer.  He is currently on treatment with carboplatin for an AUC of 5, etoposide 100 mg meter squared, and Tecentriq 1200 mg IV every 3 weeks.  Status post 1 cycle.  He tolerated the first cycle well overall with no concerning complaints.  Labs from today have been reviewed.  Recommend for him to proceed with cycle 2 as scheduled.  Creatinine today is 1.54 which is consistent with his baseline.  Okay to proceed with treatment with a slightly elevated creatinine.  He will have weekly labs.  The patient will follow-up in 3 weeks for evaluation prior to cycle #3.  We will order a restaging CT scan of the chest, abdomen, pelvis after 3 cycles.  He had a PET scan performed just after starting cycle #1 which was reviewed with him today.  He was advised to call immediately if he has any concerning symptoms in the interval. The patient voices understanding of current disease status and treatment options and is in agreement with the current care plan.  All questions were answered. The patient knows to call the clinic with any problems, questions or concerns. We can certainly see the patient much sooner if necessary.

## 2018-09-20 ENCOUNTER — Ambulatory Visit: Payer: Medicare HMO

## 2018-09-20 ENCOUNTER — Inpatient Hospital Stay: Payer: BLUE CROSS/BLUE SHIELD

## 2018-09-20 VITALS — BP 129/57 | HR 73 | Temp 97.6°F | Resp 16

## 2018-09-20 DIAGNOSIS — C3411 Malignant neoplasm of upper lobe, right bronchus or lung: Secondary | ICD-10-CM | POA: Diagnosis not present

## 2018-09-20 DIAGNOSIS — C3490 Malignant neoplasm of unspecified part of unspecified bronchus or lung: Secondary | ICD-10-CM

## 2018-09-20 MED ORDER — DEXAMETHASONE SODIUM PHOSPHATE 10 MG/ML IJ SOLN
INTRAMUSCULAR | Status: AC
  Filled 2018-09-20: qty 1

## 2018-09-20 MED ORDER — DEXAMETHASONE SODIUM PHOSPHATE 10 MG/ML IJ SOLN
10.0000 mg | Freq: Once | INTRAMUSCULAR | Status: AC
  Administered 2018-09-20: 10 mg via INTRAVENOUS

## 2018-09-20 MED ORDER — SODIUM CHLORIDE 0.9 % IV SOLN
96.0000 mg/m2 | Freq: Once | INTRAVENOUS | Status: AC
  Administered 2018-09-20: 200 mg via INTRAVENOUS
  Filled 2018-09-20: qty 10

## 2018-09-20 MED ORDER — SODIUM CHLORIDE 0.9 % IV SOLN
Freq: Once | INTRAVENOUS | Status: AC
  Administered 2018-09-20: 14:00:00 via INTRAVENOUS
  Filled 2018-09-20: qty 250

## 2018-09-20 NOTE — Patient Instructions (Signed)
[place] Discharge Instructions for Patients Receiving Chemotherapy  Today you received the following chemotherapy agents:  Etoposide.  To help prevent nausea and vomiting after your treatment, we encourage you to take your nausea medication as directed.   If you develop nausea and vomiting that is not controlled by your nausea medication, call the clinic.   BELOW ARE SYMPTOMS THAT SHOULD BE REPORTED IMMEDIATELY:  *FEVER GREATER THAN 100.5 F  *CHILLS WITH OR WITHOUT FEVER  NAUSEA AND VOMITING THAT IS NOT CONTROLLED WITH YOUR NAUSEA MEDICATION  *UNUSUAL SHORTNESS OF BREATH  *UNUSUAL BRUISING OR BLEEDING  TENDERNESS IN MOUTH AND THROAT WITH OR WITHOUT PRESENCE OF ULCERS  *URINARY PROBLEMS  *BOWEL PROBLEMS  UNUSUAL RASH Items with * indicate a potential emergency and should be followed up as soon as possible.  Feel free to call the clinic should you have any questions or concerns. The clinic phone number is [phone].  Please show the [place] at check-in to the Emergency Department and triage nurse.

## 2018-09-21 ENCOUNTER — Ambulatory Visit: Payer: Medicare HMO

## 2018-09-21 ENCOUNTER — Inpatient Hospital Stay: Payer: BLUE CROSS/BLUE SHIELD

## 2018-09-21 VITALS — BP 112/54 | HR 68 | Temp 98.0°F | Resp 18

## 2018-09-21 DIAGNOSIS — C3490 Malignant neoplasm of unspecified part of unspecified bronchus or lung: Secondary | ICD-10-CM

## 2018-09-21 DIAGNOSIS — C3411 Malignant neoplasm of upper lobe, right bronchus or lung: Secondary | ICD-10-CM | POA: Diagnosis not present

## 2018-09-21 MED ORDER — DEXAMETHASONE SODIUM PHOSPHATE 10 MG/ML IJ SOLN
INTRAMUSCULAR | Status: AC
  Filled 2018-09-21: qty 1

## 2018-09-21 MED ORDER — SODIUM CHLORIDE 0.9 % IV SOLN
Freq: Once | INTRAVENOUS | Status: AC
  Administered 2018-09-21: 14:00:00 via INTRAVENOUS
  Filled 2018-09-21: qty 250

## 2018-09-21 MED ORDER — DEXAMETHASONE SODIUM PHOSPHATE 10 MG/ML IJ SOLN
10.0000 mg | Freq: Once | INTRAMUSCULAR | Status: AC
  Administered 2018-09-21: 10 mg via INTRAVENOUS

## 2018-09-21 MED ORDER — SODIUM CHLORIDE 0.9 % IV SOLN
95.0000 mg/m2 | Freq: Once | INTRAVENOUS | Status: AC
  Administered 2018-09-21: 200 mg via INTRAVENOUS
  Filled 2018-09-21: qty 10

## 2018-09-21 NOTE — Patient Instructions (Signed)
[place] Discharge Instructions for Patients Receiving Chemotherapy  Today you received the following chemotherapy agents:  Etoposide.  To help prevent nausea and vomiting after your treatment, we encourage you to take your nausea medication as directed.   If you develop nausea and vomiting that is not controlled by your nausea medication, call the clinic.   BELOW ARE SYMPTOMS THAT SHOULD BE REPORTED IMMEDIATELY:  *FEVER GREATER THAN 100.5 F  *CHILLS WITH OR WITHOUT FEVER  NAUSEA AND VOMITING THAT IS NOT CONTROLLED WITH YOUR NAUSEA MEDICATION  *UNUSUAL SHORTNESS OF BREATH  *UNUSUAL BRUISING OR BLEEDING  TENDERNESS IN MOUTH AND THROAT WITH OR WITHOUT PRESENCE OF ULCERS  *URINARY PROBLEMS  *BOWEL PROBLEMS  UNUSUAL RASH Items with * indicate a potential emergency and should be followed up as soon as possible.  Feel free to call the clinic should you have any questions or concerns. The clinic phone number is [phone].  Please show the [place] at check-in to the Emergency Department and triage nurse.

## 2018-09-22 ENCOUNTER — Ambulatory Visit: Payer: Medicare HMO

## 2018-09-23 ENCOUNTER — Inpatient Hospital Stay: Payer: BLUE CROSS/BLUE SHIELD

## 2018-09-23 ENCOUNTER — Ambulatory Visit: Payer: Medicare HMO

## 2018-09-23 VITALS — BP 102/47 | HR 70 | Temp 97.5°F | Resp 18

## 2018-09-23 DIAGNOSIS — C3490 Malignant neoplasm of unspecified part of unspecified bronchus or lung: Secondary | ICD-10-CM

## 2018-09-23 DIAGNOSIS — C3411 Malignant neoplasm of upper lobe, right bronchus or lung: Secondary | ICD-10-CM | POA: Diagnosis not present

## 2018-09-23 MED ORDER — PEGFILGRASTIM-CBQV 6 MG/0.6ML ~~LOC~~ SOSY
PREFILLED_SYRINGE | SUBCUTANEOUS | Status: AC
  Filled 2018-09-23: qty 0.6

## 2018-09-23 MED ORDER — PEGFILGRASTIM-CBQV 6 MG/0.6ML ~~LOC~~ SOSY
6.0000 mg | PREFILLED_SYRINGE | Freq: Once | SUBCUTANEOUS | Status: AC
  Administered 2018-09-23: 6 mg via SUBCUTANEOUS

## 2018-09-23 NOTE — Patient Instructions (Signed)
Pegfilgrastim injection What is this medicine? PEGFILGRASTIM (PEG fil gra stim) is a long-acting granulocyte colony-stimulating factor that stimulates the growth of neutrophils, a type of white blood cell important in the body's fight against infection. It is used to reduce the incidence of fever and infection in patients with certain types of cancer who are receiving chemotherapy that affects the bone marrow, and to increase survival after being exposed to high doses of radiation. This medicine may be used for other purposes; ask your health care provider or pharmacist if you have questions. COMMON BRAND NAME(S): Neulasta,Udenyca  What should I tell my health care provider before I take this medicine? They need to know if you have any of these conditions: -kidney disease -latex allergy -ongoing radiation therapy -sickle cell disease -skin reactions to acrylic adhesives (On-Body Injector only) -an unusual or allergic reaction to pegfilgrastim, filgrastim, other medicines, foods, dyes, or preservatives -pregnant or trying to get pregnant -breast-feeding How should I use this medicine? This medicine is for injection under the skin. If you get this medicine at home, you will be taught how to prepare and give the pre-filled syringe or how to use the On-body Injector. Refer to the patient Instructions for Use for detailed instructions. Use exactly as directed. Tell your healthcare provider immediately if you suspect that the On-body Injector may not have performed as intended or if you suspect the use of the On-body Injector resulted in a missed or partial dose. It is important that you put your used needles and syringes in a special sharps container. Do not put them in a trash can. If you do not have a sharps container, call your pharmacist or healthcare provider to get one. Talk to your pediatrician regarding the use of this medicine in children. While this drug may be prescribed for selected  conditions, precautions do apply. Overdosage: If you think you have taken too much of this medicine contact a poison control center or emergency room at once. NOTE: This medicine is only for you. Do not share this medicine with others. What if I miss a dose? It is important not to miss your dose. Call your doctor or health care professional if you miss your dose. If you miss a dose due to an On-body Injector failure or leakage, a new dose should be administered as soon as possible using a single prefilled syringe for manual use. What may interact with this medicine? Interactions have not been studied. Give your health care provider a list of all the medicines, herbs, non-prescription drugs, or dietary supplements you use. Also tell them if you smoke, drink alcohol, or use illegal drugs. Some items may interact with your medicine. This list may not describe all possible interactions. Give your health care provider a list of all the medicines, herbs, non-prescription drugs, or dietary supplements you use. Also tell them if you smoke, drink alcohol, or use illegal drugs. Some items may interact with your medicine. What should I watch for while using this medicine? You may need blood work done while you are taking this medicine. If you are going to need a MRI, CT scan, or other procedure, tell your doctor that you are using this medicine (On-Body Injector only). What side effects may I notice from receiving this medicine? Side effects that you should report to your doctor or health care professional as soon as possible: -allergic reactions like skin rash, itching or hives, swelling of the face, lips, or tongue -dizziness -fever -pain, redness, or irritation at   site where injected -pinpoint red spots on the skin -red or dark-brown urine -shortness of breath or breathing problems -stomach or side pain, or pain at the shoulder -swelling -tiredness -trouble passing urine or change in the amount of  urine Side effects that usually do not require medical attention (report to your doctor or health care professional if they continue or are bothersome): -bone pain -muscle pain This list may not describe all possible side effects. Call your doctor for medical advice about side effects. You may report side effects to FDA at 1-800-FDA-1088. Where should I keep my medicine? Keep out of the reach of children. Store pre-filled syringes in a refrigerator between 2 and 8 degrees C (36 and 46 degrees F). Do not freeze. Keep in carton to protect from light. Throw away this medicine if it is left out of the refrigerator for more than 48 hours. Throw away any unused medicine after the expiration date. NOTE: This sheet is a summary. It may not cover all possible information. If you have questions about this medicine, talk to your doctor, pharmacist, or health care provider.  2018 Elsevier/Gold Standard (2016-10-15 12:58:03)  

## 2018-09-26 ENCOUNTER — Ambulatory Visit: Payer: Medicare HMO

## 2018-09-26 ENCOUNTER — Inpatient Hospital Stay: Payer: BLUE CROSS/BLUE SHIELD

## 2018-09-26 DIAGNOSIS — C3411 Malignant neoplasm of upper lobe, right bronchus or lung: Secondary | ICD-10-CM | POA: Diagnosis not present

## 2018-09-26 DIAGNOSIS — C3491 Malignant neoplasm of unspecified part of right bronchus or lung: Secondary | ICD-10-CM

## 2018-09-26 LAB — CBC WITH DIFFERENTIAL (CANCER CENTER ONLY)
ABS IMMATURE GRANULOCYTES: 0.5 10*3/uL — AB (ref 0.00–0.07)
BASOS ABS: 0.3 10*3/uL — AB (ref 0.0–0.1)
Band Neutrophils: 9 %
Basophils Relative: 1 %
Eosinophils Absolute: 0.3 10*3/uL (ref 0.0–0.5)
Eosinophils Relative: 1 %
HEMATOCRIT: 27.4 % — AB (ref 39.0–52.0)
Hemoglobin: 8.5 g/dL — ABNORMAL LOW (ref 13.0–17.0)
LYMPHS ABS: 0.8 10*3/uL (ref 0.7–4.0)
Lymphocytes Relative: 3 %
MCH: 28.9 pg (ref 26.0–34.0)
MCHC: 31 g/dL (ref 30.0–36.0)
MCV: 93.2 fL (ref 80.0–100.0)
MONOS PCT: 0 %
Metamyelocytes Relative: 1 %
Monocytes Absolute: 0 10*3/uL — ABNORMAL LOW (ref 0.1–1.0)
Myelocytes: 1 %
NEUTROS ABS: 24.2 10*3/uL — AB (ref 1.7–17.7)
Neutrophils Relative %: 84 %
Platelet Count: 128 10*3/uL — ABNORMAL LOW (ref 150–400)
RBC: 2.94 MIL/uL — ABNORMAL LOW (ref 4.22–5.81)
RDW: 16 % — ABNORMAL HIGH (ref 11.5–15.5)
WBC Count: 26 10*3/uL — ABNORMAL HIGH (ref 4.0–10.5)
nRBC: 0 % (ref 0.0–0.2)

## 2018-09-27 ENCOUNTER — Ambulatory Visit: Payer: Medicare HMO

## 2018-09-28 ENCOUNTER — Ambulatory Visit: Payer: Medicare HMO

## 2018-10-03 ENCOUNTER — Encounter: Payer: Self-pay | Admitting: Emergency Medicine

## 2018-10-03 ENCOUNTER — Inpatient Hospital Stay: Payer: BLUE CROSS/BLUE SHIELD | Attending: Internal Medicine

## 2018-10-03 ENCOUNTER — Ambulatory Visit: Payer: Medicare HMO

## 2018-10-03 ENCOUNTER — Ambulatory Visit (INDEPENDENT_AMBULATORY_CARE_PROVIDER_SITE_OTHER): Payer: BLUE CROSS/BLUE SHIELD | Admitting: Emergency Medicine

## 2018-10-03 DIAGNOSIS — C771 Secondary and unspecified malignant neoplasm of intrathoracic lymph nodes: Secondary | ICD-10-CM | POA: Diagnosis not present

## 2018-10-03 DIAGNOSIS — J90 Pleural effusion, not elsewhere classified: Secondary | ICD-10-CM | POA: Diagnosis not present

## 2018-10-03 DIAGNOSIS — Z7901 Long term (current) use of anticoagulants: Secondary | ICD-10-CM | POA: Diagnosis not present

## 2018-10-03 DIAGNOSIS — C3490 Malignant neoplasm of unspecified part of unspecified bronchus or lung: Secondary | ICD-10-CM | POA: Diagnosis not present

## 2018-10-03 DIAGNOSIS — J449 Chronic obstructive pulmonary disease, unspecified: Secondary | ICD-10-CM

## 2018-10-03 DIAGNOSIS — I4891 Unspecified atrial fibrillation: Secondary | ICD-10-CM | POA: Diagnosis not present

## 2018-10-03 DIAGNOSIS — Z5111 Encounter for antineoplastic chemotherapy: Secondary | ICD-10-CM | POA: Diagnosis not present

## 2018-10-03 DIAGNOSIS — N529 Male erectile dysfunction, unspecified: Secondary | ICD-10-CM | POA: Diagnosis not present

## 2018-10-03 DIAGNOSIS — C3491 Malignant neoplasm of unspecified part of right bronchus or lung: Secondary | ICD-10-CM

## 2018-10-03 DIAGNOSIS — J9621 Acute and chronic respiratory failure with hypoxia: Secondary | ICD-10-CM

## 2018-10-03 DIAGNOSIS — Z5112 Encounter for antineoplastic immunotherapy: Secondary | ICD-10-CM | POA: Insufficient documentation

## 2018-10-03 DIAGNOSIS — I5042 Chronic combined systolic (congestive) and diastolic (congestive) heart failure: Secondary | ICD-10-CM | POA: Insufficient documentation

## 2018-10-03 DIAGNOSIS — I255 Ischemic cardiomyopathy: Secondary | ICD-10-CM | POA: Insufficient documentation

## 2018-10-03 DIAGNOSIS — I2510 Atherosclerotic heart disease of native coronary artery without angina pectoris: Secondary | ICD-10-CM | POA: Insufficient documentation

## 2018-10-03 DIAGNOSIS — E785 Hyperlipidemia, unspecified: Secondary | ICD-10-CM | POA: Diagnosis not present

## 2018-10-03 DIAGNOSIS — I110 Hypertensive heart disease with heart failure: Secondary | ICD-10-CM | POA: Diagnosis not present

## 2018-10-03 DIAGNOSIS — C3411 Malignant neoplasm of upper lobe, right bronchus or lung: Secondary | ICD-10-CM | POA: Insufficient documentation

## 2018-10-03 DIAGNOSIS — I480 Paroxysmal atrial fibrillation: Secondary | ICD-10-CM | POA: Insufficient documentation

## 2018-10-03 DIAGNOSIS — D649 Anemia, unspecified: Secondary | ICD-10-CM | POA: Insufficient documentation

## 2018-10-03 DIAGNOSIS — Z7689 Persons encountering health services in other specified circumstances: Secondary | ICD-10-CM | POA: Diagnosis not present

## 2018-10-03 DIAGNOSIS — I252 Old myocardial infarction: Secondary | ICD-10-CM | POA: Diagnosis not present

## 2018-10-03 DIAGNOSIS — C7989 Secondary malignant neoplasm of other specified sites: Secondary | ICD-10-CM | POA: Diagnosis not present

## 2018-10-03 DIAGNOSIS — Z7982 Long term (current) use of aspirin: Secondary | ICD-10-CM | POA: Diagnosis not present

## 2018-10-03 DIAGNOSIS — E119 Type 2 diabetes mellitus without complications: Secondary | ICD-10-CM | POA: Diagnosis not present

## 2018-10-03 LAB — CBC WITH DIFFERENTIAL (CANCER CENTER ONLY)
ABS IMMATURE GRANULOCYTES: 1 10*3/uL — AB (ref 0.00–0.07)
BAND NEUTROPHILS: 7 %
Basophils Absolute: 0.7 10*3/uL — ABNORMAL HIGH (ref 0.0–0.1)
Basophils Relative: 3 %
Eosinophils Absolute: 0.2 10*3/uL (ref 0.0–0.5)
Eosinophils Relative: 1 %
HCT: 29.4 % — ABNORMAL LOW (ref 39.0–52.0)
Hemoglobin: 9.2 g/dL — ABNORMAL LOW (ref 13.0–17.0)
LYMPHS ABS: 3.8 10*3/uL (ref 0.7–4.0)
Lymphocytes Relative: 16 %
MCH: 29 pg (ref 26.0–34.0)
MCHC: 31.3 g/dL (ref 30.0–36.0)
MCV: 92.7 fL (ref 80.0–100.0)
METAMYELOCYTES PCT: 2 %
MONOS PCT: 6 %
MYELOCYTES: 2 %
Monocytes Absolute: 1.4 10*3/uL — ABNORMAL HIGH (ref 0.1–1.0)
Neutro Abs: 16.7 10*3/uL (ref 1.7–17.7)
Neutrophils Relative %: 63 %
PLATELETS: 97 10*3/uL — AB (ref 150–400)
RBC: 3.17 MIL/uL — ABNORMAL LOW (ref 4.22–5.81)
RDW: 18.2 % — ABNORMAL HIGH (ref 11.5–15.5)
WBC Count: 23.9 10*3/uL — ABNORMAL HIGH (ref 4.0–10.5)
nRBC: 0.4 % — ABNORMAL HIGH (ref 0.0–0.2)

## 2018-10-03 MED ORDER — TIOTROPIUM BROMIDE-OLODATEROL 2.5-2.5 MCG/ACT IN AERS: 2.0000 | INHALATION_SPRAY | Freq: Every day | RESPIRATORY_TRACT | 0 refills | Status: DC

## 2018-10-03 NOTE — Assessment & Plan Note (Signed)
Following with oncology, undergoing chemotherapy and has completed 2 rounds.  He is tolerating well.  Next imaging will likely be in 1 to 2 months.

## 2018-10-03 NOTE — Assessment & Plan Note (Signed)
Based on his pulmonary function testing from September, clinical history I suspect that he does have COPD as a contributor to his dyspnea.  Certainly the other issues that have been addressed in the last few months have been contributing as well.  I think will be reasonable to start him on a scheduled bronchodilator to see if he gets clinical benefit.  I will try Stiolto.  If he does benefit then we will order through his pharmacy and continue.  He has albuterol that he can use as needed.

## 2018-10-03 NOTE — Assessment & Plan Note (Signed)
Hypoxemia noted when he was discharged from the hospital at the time of his CABG and small cell lung cancer diagnosis.  Has been using oxygen with exertion 2 L/min.  Walking oximetry today on room air showed a desaturation to 89% after 210 feet.  He had to stop at that time due to dyspnea.  I like to keep his oxygen available to use with exertion, retest him after we have him on bronchodilator therapy.  Its possible that with treatment of his COPD, lung cancer (and his CAD has been addressed) that he will not continue to require.  If he still desaturates with exertion at that time then we will try to get him a portable oxygen concentrator.

## 2018-10-03 NOTE — Progress Notes (Signed)
Subjective:    Patient ID: Gregory Bates, male    DOB: 04/21/1941, 77 y.o.   MRN: [id number]  HPI 77 year old former smoker (60 pack years) with history of coronary disease, atrial fibrillation on amiodarone, hypertension, combined systolic and diastolic CHF, diabetes, allergic rhinitis.  He has a new diagnosis of extensive stage small cell lung cancer that was diagnosed with evaluation of pulmonary nodules, mediastinal lymphadenopathy and pericardial metastatic disease, followed by Dr. Servando Snare and Julien Nordmann.  The dx was made on Bx when he was undergoing CABG in early October. Required a R thora on 10/13, repeat CXR 11/14 without much reaccumulation.  He is currently on palliative systemic chemotherapy has completed 2 rounds and appears to be tolerating. He has fairly long-standing exertional SOB. He has been prescribed O2 w exertion 2L/min on d/c from that hospitalization. The breathing may be a bit better since the hospitalization (? Due to the CABG, the O2, ? Both).  He has daily cough, usually non-productive, not necessarily at any certain time. He feels some head congestion, little PND. He was started on albuterol to use prn. He isn't sure it is doing much. His activity level is low. He is interested in a POC, assuming he still needs O2.   Spirometry done 07/28/18 reviewed by me >> obstruction, probably mixed restriction. Severely decreased FEV1, 1.55 L, 50% predicted.    Review of Systems  Constitutional: Positive for unexpected weight change. Negative for fever.  HENT: Positive for ear pain. Negative for congestion, dental problem, nosebleeds, postnasal drip, rhinorrhea, sinus pressure, sneezing, sore throat and trouble swallowing.   Eyes: Negative for redness and itching.  Respiratory: Positive for cough and shortness of breath. Negative for chest tightness and wheezing.   Cardiovascular: Negative for palpitations and leg swelling.  Gastrointestinal: Negative for nausea and vomiting.    Genitourinary: Negative for dysuria.  Musculoskeletal: Negative for joint swelling.  Skin: Negative for rash.  Neurological: Negative for headaches.  Hematological: Does not bruise/bleed easily.  Psychiatric/Behavioral: Negative for dysphoric mood. The patient is not nervous/anxious.     Past Medical History:  Diagnosis Date  . Acute renal insufficiency    a. During 11/2013 adm with atrial flutter.  . Anemia, unspecified   . Atrial fibrillation ([place])   . CAD (coronary artery disease), native coronary artery    a. s/p cath May 2011 >> DES distal RCA;  b. 01/2013 NSTEMI >> OM1 99p (2.75x12 Promus Premier DES);  c. LHC (2/15): PCI: Promus DES to dist RCA ;  d. LHC 1/16 - dLM 82, oLAD 30, small D1 tandem 93, pOM1 stent ok, mOM 50-60, dOM bifurcation 50-60 in both vessels, AV 33-70 jailed by stent, pRCA 60 mRCA stent ok, dRCA stent ok, PLB smal 50, dPDA 60, 80; EF 35-40 >> med Rx - PCI of PDA if angina  . Chronic combined systolic and diastolic CHF (congestive heart failure) ([place]) 12/23/2015   Echo 2/17: Mild LVH, EF 45-50%, inferior akinesis, grade 2 diastolic dysfunction, mild AI, mild MR, mild LAE, PASP 44 mmHg   . Colorectal polyps 2002  . Diabetes mellitus    AODM  . Erectile dysfunction   . History of echocardiogram    a. 01/2013 Echo: EF 55%, mid-dist inflat HK, Gr1 DD, Triv AI/TR, Mild MR.;  b.  Echo (1/15): Mild LVH, EF 55%, inferolateral hypokinesis, grade 1 diastolic dysfunction, mild AI, trivial MR, moderate LAE, PASP 36 mmHg   . Hx of cardiovascular stress test    Stress myoview 08/07/13  with LVEF 44%, inferior scar, no ischemia  . Hyperlipidemia   . Hypertension   . Hyponatremia   . Ischemic cardiomyopathy    a. EF 35-40% by LHC in 1/16; b. Echo 2/17: Mild LVH, EF 45-50%, inferior akinesis, grade 2 diastolic dysfunction, mild AI, mild MR, mild LAE, PASP 44 mmHg  . Myocardial infarction ([place])   . Oxygen deficiency   . PAF (paroxysmal atrial fibrillation) ([place])    a. Dx  11/2013. Spont conv. Placed on eliquis.  . Small cell lung cancer in adult [facility]) 07/28/2018     Family History  Problem Relation Age of Onset  . Heart attack Brother 24  . Colon cancer Brother   . Prostate cancer Brother   . Breast cancer Sister   . Stroke Sister   . Diabetes Neg Hx   . Hypertension Neg Hx      Social History   Socioeconomic History  . Marital status: Married    Spouse name: Not on file  . Number of children: 1  . Years of education: Not on file  . Highest education level: Not on file  Occupational History  . Occupation: maintenance    Comment: full time  Social Needs  . Financial resource strain: Not hard at all  . Food insecurity:    Worry: Never true    Inability: Never true  . Transportation needs:    Medical: Yes    Non-medical: Yes  Tobacco Use  . Smoking status: Former Smoker    Packs/day: 2.00    Years: 30.00    Pack years: 60.00    Types: Cigarettes    Last attempt to quit: 11/02/1986    Years since quitting: 31.9  . Smokeless tobacco: Never Used  . Tobacco comment: smoked [place], up to 3 ppd (!)  Substance and Sexual Activity  . Alcohol use: No  . Drug use: No  . Sexual activity: Not on file  Lifestyle  . Physical activity:    Days per week: Patient refused    Minutes per session: Patient refused  . Stress: Not at all  Relationships  . Social connections:    Talks on phone: Patient refused    Gets together: Patient refused    Attends religious service: Patient refused    Active member of club or organization: Patient refused    Attends meetings of clubs or organizations: Patient refused    Relationship status: Patient refused  . Intimate partner violence:    Fear of current or ex partner: Patient refused    Emotionally abused: Patient refused    Physically abused: Patient refused    Forced sexual activity: Patient refused  Other Topics Concern  . Not on file  Social History Narrative   [employer] - does maintenance      Allergies  Allergen Reactions  . Brilinta [Ticagrelor] Other (See Comments)    Arthralgias & myalgias  . Crestor [Rosuvastatin] Other (See Comments)    Myalgias     Outpatient Medications Prior to Visit  Medication Sig Dispense Refill  . acetaminophen (TYLENOL) 325 MG tablet Take 2 tablets (650 mg total) by mouth every 6 (six) hours as needed for mild pain.    [name] Kitchen albuterol (PROVENTIL HFA;VENTOLIN HFA) 108 (90 Base) MCG/ACT inhaler Inhale 2 puffs into the lungs every 6 (six) hours as needed for wheezing or shortness of breath. 1 Inhaler 2  . amiodarone (PACERONE) 200 MG tablet Take 1 tablet (200 mg total) by mouth 2 (two)  times daily. For one week;then take 200 mg daily thereafter (Patient taking differently: Take 200 mg by mouth See admin instructions. Take 200mg  by mouth twice daily for one week;then take 200 mg daily thereafter) 90 tablet 3  . aspirin EC 81 MG EC tablet Take 1 tablet (81 mg total) by mouth daily.    [name] Kitchen atorvastatin (LIPITOR) 20 MG tablet Take 1 tablet (20 mg total) by mouth daily. 90 tablet 3  . cholecalciferol (VITAMIN D) 1000 UNITS tablet Take 1,000 Units by mouth every evening.     . clopidogrel (PLAVIX) 75 MG tablet     . cyclobenzaprine (FLEXERIL) 5 MG tablet Take 1 tablet (5 mg total) by mouth 2 (two) times daily as needed for muscle spasms (cramping). 30 tablet 0  . ELIQUIS 5 MG TABS tablet TAKE 1 TABLET BY MOUTH TWICE DAILY (Patient taking differently: Take 5 mg by mouth 2 (two) times daily. ) 180 tablet 3  . fexofenadine (ALLEGRA) 180 MG tablet Take 180 mg by mouth daily as needed for allergies.     . fluticasone (FLONASE) 50 MCG/ACT nasal spray Place 1 spray into both nostrils daily as needed for allergies.     . furosemide (LASIX) 40 MG tablet Take 1 tablet (40 mg total) by mouth 2 (two) times daily. 60 tablet 3  . guaiFENesin (MUCINEX) 600 MG 12 hr tablet Take 1 tablet (600 mg total) by mouth 2 (two) times daily as needed for cough or to loosen phlegm.    .  isosorbide mononitrate (IMDUR) 60 MG 24 hr tablet Take 60 mg by mouth daily.    . metoprolol tartrate (LOPRESSOR) 25 MG tablet Take 1 tablet (25 mg total) by mouth 2 (two) times daily. 180 tablet 3  . Multiple Vitamin (MULTIVITAMIN) tablet Take 1 tablet by mouth at bedtime.     . ondansetron (ZOFRAN ODT) 4 MG disintegrating tablet Take 1 tablet (4 mg total) by mouth every 8 (eight) hours as needed for nausea or vomiting. 20 tablet 0  . prochlorperazine (COMPAZINE) 10 MG tablet Take 1 tablet (10 mg total) by mouth every 6 (six) hours as needed for nausea or vomiting. 30 tablet 0  . traMADol (ULTRAM) 50 MG tablet Take 1-2 tablets (50-100 mg total) by mouth every 6 (six) hours as needed for moderate pain or severe pain. Take 50 mg by mouth every 6-8 hours PRN for moderate pain. You can take 2 tabs for severe pain. 30 tablet 0  . vitamin E 100 UNIT capsule Take 100 Units by mouth at bedtime.      No facility-administered medications prior to visit.         Objective:   Physical Exam Vitals:   10/03/18 1100  BP: 126/64  Pulse: 81  SpO2: 93%  Weight: 175 lb (79.4 kg)  Height: 5\' 11"  (1.803 m)   Gen: Pleasant, well-nourished, in no distress,  normal affect  ENT: No lesions,  mouth clear,  oropharynx clear, M4 airway, no postnasal drip  Neck: No JVD, no stridor  Lungs: No use of accessory muscles, no crackles or wheezing on normal respiration, slightly decreased breath sounds at his right base  Cardiovascular: RRR, heart sounds normal, no murmur or gallops, no peripheral edema, healed sternotomy scar  Musculoskeletal: No deformities, no cyanosis or clubbing  Neuro: alert, awake, non focal  Skin: Warm, no lesions or rash      Assessment & Plan:  Pleural effusion, right No evidence of significant recurrence on physical exam although he  is slightly decreased at the right base, overall good air movement.  His most recent chest x-ray was done 1 month after his thoracentesis.  Suspect  that reaccumulation has been prevented by his chemotherapy  Extensive stage primary small cell carcinoma of lung [facility]) Following with oncology, undergoing chemotherapy and has completed 2 rounds.  He is tolerating well.  Next imaging will likely be in 1 to 2 months.  Acute on chronic respiratory failure (HCC) Hypoxemia noted when he was discharged from the hospital at the time of his CABG and small cell lung cancer diagnosis.  Has been using oxygen with exertion 2 L/min.  Walking oximetry today on room air showed a desaturation to 89% after 210 feet.  He had to stop at that time due to dyspnea.  I like to keep his oxygen available to use with exertion, retest him after we have him on bronchodilator therapy.  Its possible that with treatment of his COPD, lung cancer (and his CAD has been addressed) that he will not continue to require.  If he still desaturates with exertion at that time then we will try to get him a portable oxygen concentrator.  COPD (chronic obstructive pulmonary disease) (HCC) Based on his pulmonary function testing from September, clinical history I suspect that he does have COPD as a contributor to his dyspnea.  Certainly the other issues that have been addressed in the last few months have been contributing as well.  I think will be reasonable to start him on a scheduled bronchodilator to see if he gets clinical benefit.  I will try Stiolto.  If he does benefit then we will order through his pharmacy and continue.  He has albuterol that he can use as needed.  Baltazar Apo, MD, PhD 10/03/2018, 11:42 AM [facility] [phone] or if no answer [phone]

## 2018-10-03 NOTE — Patient Instructions (Signed)
We will try starting Stiolto 2 puffs once daily to see if your breathing benefits.  If so then please call our office so that we can continue this medication through your pharmacy. Keep albuterol available to use 2 puffs if needed for shortness of breath, chest tightness, wheezing. Keep your oxygen available to use 2 L/min with heavy exertion.  We will retest your oxygen needs at your follow-up visit once you are on inhaler treatment.  If your oxygen levels continue to drop and we will arrange for a portable oxygen concentrator. Follow with Dr Lamonte Sakai in 1 month or next available

## 2018-10-03 NOTE — Assessment & Plan Note (Signed)
No evidence of significant recurrence on physical exam although he is slightly decreased at the right base, overall good air movement.  His most recent chest x-ray was done 1 month after his thoracentesis.  Suspect that reaccumulation has been prevented by his chemotherapy

## 2018-10-04 ENCOUNTER — Ambulatory Visit: Payer: Medicare HMO

## 2018-10-04 ENCOUNTER — Institutional Professional Consult (permissible substitution): Payer: BLUE CROSS/BLUE SHIELD | Admitting: Emergency Medicine

## 2018-10-05 ENCOUNTER — Ambulatory Visit: Payer: Medicare HMO

## 2018-10-06 ENCOUNTER — Ambulatory Visit: Payer: Medicare HMO

## 2018-10-07 ENCOUNTER — Ambulatory Visit: Payer: Medicare HMO

## 2018-10-08 ENCOUNTER — Other Ambulatory Visit: Payer: Self-pay | Admitting: Internal Medicine

## 2018-10-10 ENCOUNTER — Telehealth: Payer: Self-pay | Admitting: Emergency Medicine

## 2018-10-10 ENCOUNTER — Other Ambulatory Visit: Payer: 59

## 2018-10-10 ENCOUNTER — Ambulatory Visit: Payer: Medicare HMO

## 2018-10-10 MED ORDER — TIOTROPIUM BROMIDE-OLODATEROL 2.5-2.5 MCG/ACT IN AERS: 2.0000 | INHALATION_SPRAY | Freq: Every day | RESPIRATORY_TRACT | 5 refills | Status: DC

## 2018-10-10 NOTE — Telephone Encounter (Signed)
Spoke with pt, states that he is doing well on Stiolto sample, would like this called to pharmacy.  This has been sent as requested.  Nothing further needed.

## 2018-10-11 ENCOUNTER — Ambulatory Visit: Payer: Medicare HMO

## 2018-10-11 ENCOUNTER — Inpatient Hospital Stay: Payer: BLUE CROSS/BLUE SHIELD

## 2018-10-11 ENCOUNTER — Encounter: Payer: Self-pay | Admitting: Oncology

## 2018-10-11 ENCOUNTER — Inpatient Hospital Stay (HOSPITAL_BASED_OUTPATIENT_CLINIC_OR_DEPARTMENT_OTHER): Payer: BLUE CROSS/BLUE SHIELD | Admitting: Oncology

## 2018-10-11 VITALS — BP 117/54 | HR 91 | Temp 98.4°F | Resp 18 | Ht 71.0 in | Wt 176.7 lb

## 2018-10-11 DIAGNOSIS — C3411 Malignant neoplasm of upper lobe, right bronchus or lung: Secondary | ICD-10-CM | POA: Diagnosis not present

## 2018-10-11 DIAGNOSIS — I252 Old myocardial infarction: Secondary | ICD-10-CM

## 2018-10-11 DIAGNOSIS — Z5112 Encounter for antineoplastic immunotherapy: Secondary | ICD-10-CM | POA: Diagnosis not present

## 2018-10-11 DIAGNOSIS — Z7982 Long term (current) use of aspirin: Secondary | ICD-10-CM

## 2018-10-11 DIAGNOSIS — I2510 Atherosclerotic heart disease of native coronary artery without angina pectoris: Secondary | ICD-10-CM

## 2018-10-11 DIAGNOSIS — E119 Type 2 diabetes mellitus without complications: Secondary | ICD-10-CM

## 2018-10-11 DIAGNOSIS — J90 Pleural effusion, not elsewhere classified: Secondary | ICD-10-CM | POA: Diagnosis not present

## 2018-10-11 DIAGNOSIS — C3490 Malignant neoplasm of unspecified part of unspecified bronchus or lung: Secondary | ICD-10-CM

## 2018-10-11 DIAGNOSIS — D649 Anemia, unspecified: Secondary | ICD-10-CM

## 2018-10-11 DIAGNOSIS — E785 Hyperlipidemia, unspecified: Secondary | ICD-10-CM

## 2018-10-11 DIAGNOSIS — Z7689 Persons encountering health services in other specified circumstances: Secondary | ICD-10-CM | POA: Diagnosis not present

## 2018-10-11 DIAGNOSIS — Z5111 Encounter for antineoplastic chemotherapy: Secondary | ICD-10-CM

## 2018-10-11 DIAGNOSIS — Z7901 Long term (current) use of anticoagulants: Secondary | ICD-10-CM

## 2018-10-11 DIAGNOSIS — R5382 Chronic fatigue, unspecified: Secondary | ICD-10-CM

## 2018-10-11 DIAGNOSIS — C3491 Malignant neoplasm of unspecified part of right bronchus or lung: Secondary | ICD-10-CM

## 2018-10-11 DIAGNOSIS — I255 Ischemic cardiomyopathy: Secondary | ICD-10-CM

## 2018-10-11 DIAGNOSIS — C771 Secondary and unspecified malignant neoplasm of intrathoracic lymph nodes: Secondary | ICD-10-CM

## 2018-10-11 DIAGNOSIS — I4891 Unspecified atrial fibrillation: Secondary | ICD-10-CM | POA: Diagnosis not present

## 2018-10-11 DIAGNOSIS — C7989 Secondary malignant neoplasm of other specified sites: Secondary | ICD-10-CM | POA: Diagnosis not present

## 2018-10-11 DIAGNOSIS — N529 Male erectile dysfunction, unspecified: Secondary | ICD-10-CM

## 2018-10-11 DIAGNOSIS — I5042 Chronic combined systolic (congestive) and diastolic (congestive) heart failure: Secondary | ICD-10-CM

## 2018-10-11 DIAGNOSIS — I110 Hypertensive heart disease with heart failure: Secondary | ICD-10-CM

## 2018-10-11 DIAGNOSIS — I480 Paroxysmal atrial fibrillation: Secondary | ICD-10-CM

## 2018-10-11 LAB — CBC WITH DIFFERENTIAL (CANCER CENTER ONLY)
Abs Immature Granulocytes: 0.09 10*3/uL — ABNORMAL HIGH (ref 0.00–0.07)
Basophils Absolute: 0.1 10*3/uL (ref 0.0–0.1)
Basophils Relative: 1 %
Eosinophils Absolute: 0.1 10*3/uL (ref 0.0–0.5)
Eosinophils Relative: 1 %
HCT: 29.4 % — ABNORMAL LOW (ref 39.0–52.0)
HEMOGLOBIN: 9.2 g/dL — AB (ref 13.0–17.0)
Immature Granulocytes: 1 %
Lymphocytes Relative: 11 %
Lymphs Abs: 1.2 10*3/uL (ref 0.7–4.0)
MCH: 29.6 pg (ref 26.0–34.0)
MCHC: 31.3 g/dL (ref 30.0–36.0)
MCV: 94.5 fL (ref 80.0–100.0)
Monocytes Absolute: 0.9 10*3/uL (ref 0.1–1.0)
Monocytes Relative: 9 %
Neutro Abs: 8.2 10*3/uL — ABNORMAL HIGH (ref 1.7–7.7)
Neutrophils Relative %: 77 %
Platelet Count: 293 10*3/uL (ref 150–400)
RBC: 3.11 MIL/uL — AB (ref 4.22–5.81)
RDW: 18.9 % — ABNORMAL HIGH (ref 11.5–15.5)
WBC Count: 10.5 10*3/uL (ref 4.0–10.5)
nRBC: 0 % (ref 0.0–0.2)

## 2018-10-11 LAB — CMP (CANCER CENTER ONLY)
ALT: 15 U/L (ref 0–44)
AST: 16 U/L (ref 15–41)
Albumin: 3.8 g/dL (ref 3.5–5.0)
Alkaline Phosphatase: 88 U/L (ref 38–126)
Anion gap: 11 (ref 5–15)
BUN: 15 mg/dL (ref 8–23)
CO2: 25 mmol/L (ref 22–32)
Calcium: 9.3 mg/dL (ref 8.9–10.3)
Chloride: 104 mmol/L (ref 98–111)
Creatinine: 1.5 mg/dL — ABNORMAL HIGH (ref 0.61–1.24)
GFR, Est AFR Am: 51 mL/min — ABNORMAL LOW (ref >60)
GFR, Estimated: 44 mL/min — ABNORMAL LOW (ref >60)
Glucose, Bld: 142 mg/dL — ABNORMAL HIGH (ref 70–99)
Potassium: 3.9 mmol/L (ref 3.5–5.1)
SODIUM: 140 mmol/L (ref 135–145)
Total Bilirubin: 0.5 mg/dL (ref 0.3–1.2)
Total Protein: 6.8 g/dL (ref 6.5–8.1)

## 2018-10-11 LAB — TSH: TSH: 0.344 u[IU]/mL (ref 0.320–4.118)

## 2018-10-11 MED ORDER — SODIUM CHLORIDE 0.9% FLUSH
10.0000 mL | INTRAVENOUS | Status: DC | PRN
  Filled 2018-10-11: qty 10

## 2018-10-11 MED ORDER — HEPARIN SOD (PORK) LOCK FLUSH 100 UNIT/ML IV SOLN
500.0000 [IU] | Freq: Once | INTRAVENOUS | Status: DC | PRN
  Filled 2018-10-11: qty 5

## 2018-10-11 MED ORDER — SODIUM CHLORIDE 0.9 % IV SOLN
1200.0000 mg | Freq: Once | INTRAVENOUS | Status: AC
  Administered 2018-10-11: 1200 mg via INTRAVENOUS
  Filled 2018-10-11: qty 20

## 2018-10-11 MED ORDER — PALONOSETRON HCL INJECTION 0.25 MG/5ML
0.2500 mg | Freq: Once | INTRAVENOUS | Status: AC
  Administered 2018-10-11: 0.25 mg via INTRAVENOUS

## 2018-10-11 MED ORDER — SODIUM CHLORIDE 0.9 % IV SOLN
379.5000 mg | Freq: Once | INTRAVENOUS | Status: AC
  Administered 2018-10-11: 380 mg via INTRAVENOUS
  Filled 2018-10-11: qty 38

## 2018-10-11 MED ORDER — SODIUM CHLORIDE 0.9 % IV SOLN
95.0000 mg/m2 | Freq: Once | INTRAVENOUS | Status: AC
  Administered 2018-10-11: 200 mg via INTRAVENOUS
  Filled 2018-10-11: qty 10

## 2018-10-11 MED ORDER — SODIUM CHLORIDE 0.9 % IV SOLN
Freq: Once | INTRAVENOUS | Status: AC
  Administered 2018-10-11: 12:00:00 via INTRAVENOUS
  Filled 2018-10-11: qty 5

## 2018-10-11 MED ORDER — PALONOSETRON HCL INJECTION 0.25 MG/5ML
INTRAVENOUS | Status: AC
  Filled 2018-10-11: qty 5

## 2018-10-11 MED ORDER — SODIUM CHLORIDE 0.9 % IV SOLN
Freq: Once | INTRAVENOUS | Status: AC
  Administered 2018-10-11: 11:00:00 via INTRAVENOUS
  Filled 2018-10-11: qty 250

## 2018-10-11 NOTE — Patient Instructions (Signed)
[facility] Discharge Instructions for Patients Receiving Chemotherapy  Today you received the following chemotherapy agents Atezolizumab (TECENTRIQ), Carboplatin (PARAPLATIN) & Etoposide (Vepeside).  To help prevent nausea and vomiting after your treatment, we encourage you to take your nausea medication as prescribed.   If you develop nausea and vomiting that is not controlled by your nausea medication, call the clinic.   BELOW ARE SYMPTOMS THAT SHOULD BE REPORTED IMMEDIATELY:  *FEVER GREATER THAN 100.5 F  *CHILLS WITH OR WITHOUT FEVER  NAUSEA AND VOMITING THAT IS NOT CONTROLLED WITH YOUR NAUSEA MEDICATION  *UNUSUAL SHORTNESS OF BREATH  *UNUSUAL BRUISING OR BLEEDING  TENDERNESS IN MOUTH AND THROAT WITH OR WITHOUT PRESENCE OF ULCERS  *URINARY PROBLEMS  *BOWEL PROBLEMS  UNUSUAL RASH Items with * indicate a potential emergency and should be followed up as soon as possible.  Feel free to call the clinic should you have any questions or concerns. The clinic phone number is [phone].  Please show the [place] at check-in to the Emergency Department and triage nurse.

## 2018-10-11 NOTE — Progress Notes (Signed)
[place], MD [place] [address]  DIAGNOSIS:Extensive stage (T1 a, N2, M1a)extensive stage small cell lung cancer presented with right lung nodules in addition to mediastinal lymphadenopathy as well as pericardial metastasis diagnosed in October 2019  PRIOR THERAPY:None.  CURRENT THERAPY:palliative systemic chemotherapy with carboplatin for AUC of 5 on day 1, etoposide 100 mg/M2 on days 1, 2 and 3 in addition to Tecentriq (Atezolizumab) 1200 mg IV every 3 weeks.  First dose given on 08/29/2018.  Status post 2 cycles.  INTERVAL HISTORY: [name] 77 y.o. male returns for routine follow-up visit accompanied by his wife.  The patient is feeling fine today and has no specific complaints.  He denies fevers and chills.  Denies chest pain, shortness of breath, cough, hemoptysis.  Denies nausea, vomiting, constipation, diarrhea.  Denies recent weight loss or night sweats.  The patient has been tolerating his treatment with chemotherapy fairly well.  The patient is here for evaluation prior to cycle #3 of his treatment.  MEDICAL HISTORY: Past Medical History:  Diagnosis Date  . Acute renal insufficiency    a. During 11/2013 adm with atrial flutter.  . Anemia, unspecified   . Atrial fibrillation ([place])   . CAD (coronary artery disease), native coronary artery    a. s/p cath May 2011 >> DES distal RCA;  b. 01/2013 NSTEMI >> OM1 99p (2.75x12 Promus Premier DES);  c. LHC (2/15): PCI: Promus DES to dist RCA ;  d. LHC 1/16 - dLM 27, oLAD 30, small D1 tandem 35, pOM1 stent ok, mOM 50-60, dOM bifurcation 50-60 in both vessels, AV 28-70 jailed by stent, pRCA 74 mRCA stent ok, dRCA stent ok, PLB smal 50, dPDA 60, 80; EF 35-40 >> med Rx - PCI of PDA if angina  . Chronic combined systolic and diastolic CHF (congestive heart failure) ([place]) 12/23/2015   Echo 2/17: Mild LVH, EF 45-50%, inferior akinesis, grade 2 diastolic  dysfunction, mild AI, mild MR, mild LAE, PASP 44 mmHg   . Colorectal polyps 2002  . Diabetes mellitus    AODM  . Erectile dysfunction   . History of echocardiogram    a. 01/2013 Echo: EF 55%, mid-dist inflat HK, Gr1 DD, Triv AI/TR, Mild MR.;  b.  Echo (1/15): Mild LVH, EF 55%, inferolateral hypokinesis, grade 1 diastolic dysfunction, mild AI, trivial MR, moderate LAE, PASP 36 mmHg   . Hx of cardiovascular stress test    Stress myoview 08/07/13 with LVEF 44%, inferior scar, no ischemia  . Hyperlipidemia   . Hypertension   . Hyponatremia   . Ischemic cardiomyopathy    a. EF 35-40% by LHC in 1/16; b. Echo 2/17: Mild LVH, EF 45-50%, inferior akinesis, grade 2 diastolic dysfunction, mild AI, mild MR, mild LAE, PASP 44 mmHg  . Myocardial infarction ([place])   . Oxygen deficiency   . PAF (paroxysmal atrial fibrillation) ([place])    a. Dx 11/2013. Spont conv. Placed on eliquis.  . Small cell lung cancer in adult [facility]) 07/28/2018    ALLERGIES:  is allergic to brilinta [ticagrelor] and crestor [rosuvastatin].  MEDICATIONS:  Current Outpatient Medications  Medication Sig Dispense Refill  . acetaminophen (TYLENOL) 325 MG tablet Take 2 tablets (650 mg total) by mouth every 6 (six) hours as needed for mild pain.    [name] Kitchen albuterol (PROVENTIL HFA;VENTOLIN HFA) 108 (90 Base) MCG/ACT inhaler Inhale 2 puffs into the lungs every 6 (six) hours as needed for wheezing  or shortness of breath. 1 Inhaler 2  . amiodarone (PACERONE) 200 MG tablet Take 1 tablet (200 mg total) by mouth 2 (two) times daily. For one week;then take 200 mg daily thereafter (Patient taking differently: Take 200 mg by mouth See admin instructions. Take 200mg  by mouth twice daily for one week;then take 200 mg daily thereafter) 90 tablet 3  . aspirin EC 81 MG EC tablet Take 1 tablet (81 mg total) by mouth daily.    [name] Kitchen atorvastatin (LIPITOR) 20 MG tablet Take 1 tablet (20 mg total) by mouth daily. 90 tablet 3  . cholecalciferol (VITAMIN D) 1000 UNITS  tablet Take 1,000 Units by mouth every evening.     . clopidogrel (PLAVIX) 75 MG tablet     . cyclobenzaprine (FLEXERIL) 5 MG tablet Take 1 tablet (5 mg total) by mouth 2 (two) times daily as needed for muscle spasms (cramping). 30 tablet 0  . ELIQUIS 5 MG TABS tablet TAKE 1 TABLET BY MOUTH TWICE DAILY (Patient taking differently: Take 5 mg by mouth 2 (two) times daily. ) 180 tablet 3  . fexofenadine (ALLEGRA) 180 MG tablet Take 180 mg by mouth daily as needed for allergies.     . fluticasone (FLONASE) 50 MCG/ACT nasal spray Place 1 spray into both nostrils daily as needed for allergies.     . furosemide (LASIX) 40 MG tablet Take 1 tablet (40 mg total) by mouth 2 (two) times daily. 60 tablet 3  . guaiFENesin (MUCINEX) 600 MG 12 hr tablet Take 1 tablet (600 mg total) by mouth 2 (two) times daily as needed for cough or to loosen phlegm.    . isosorbide mononitrate (IMDUR) 60 MG 24 hr tablet Take 60 mg by mouth daily.    . metoprolol tartrate (LOPRESSOR) 25 MG tablet Take 1 tablet (25 mg total) by mouth 2 (two) times daily. 180 tablet 3  . Multiple Vitamin (MULTIVITAMIN) tablet Take 1 tablet by mouth at bedtime.     . ondansetron (ZOFRAN ODT) 4 MG disintegrating tablet Take 1 tablet (4 mg total) by mouth every 8 (eight) hours as needed for nausea or vomiting. 20 tablet 0  . prochlorperazine (COMPAZINE) 10 MG tablet Take 1 tablet (10 mg total) by mouth every 6 (six) hours as needed for nausea or vomiting. 30 tablet 0  . Tiotropium Bromide-Olodaterol (STIOLTO RESPIMAT) 2.5-2.5 MCG/ACT AERS Inhale 2 puffs into the lungs daily. 1 Inhaler 5  . traMADol (ULTRAM) 50 MG tablet Take 1-2 tablets (50-100 mg total) by mouth every 6 (six) hours as needed for moderate pain or severe pain. Take 50 mg by mouth every 6-8 hours PRN for moderate pain. You can take 2 tabs for severe pain. 30 tablet 0  . vitamin E 100 UNIT capsule Take 100 Units by mouth at bedtime.      No current facility-administered medications for  this visit.    Facility-Administered Medications Ordered in Other Visits  Medication Dose Route Frequency Provider Last Rate Last Dose  . heparin lock flush 100 unit/mL  500 Units Intracatheter Once PRN Curt Bears, MD      . sodium chloride flush (NS) 0.9 % injection 10 mL  10 mL Intracatheter PRN Curt Bears, MD        SURGICAL HISTORY:  Past Surgical History:  Procedure Laterality Date  . BIOPSY  08/01/2018   Procedure: BIOPSY OF PERICARDIAL LYMPH NODE AND HILAR LYMPH NODE;  Surgeon: Grace Isaac, MD;  Location: [place];  Service: Open Heart Surgery;;  .  CLIPPING OF ATRIAL APPENDAGE Left 08/01/2018   Procedure: CLIPPING OF ATRIAL APPENDAGE;  Surgeon: Grace Isaac, MD;  Location: [place];  Service: Open Heart Surgery;  Laterality: Left;  . COLONOSCOPY W/ POLYPECTOMY  2002   [facility]  . COLONOSCOPY W/ POLYPECTOMY  2004; 2007 negative  . CORONARY ANGIOPLASTY  12/26/2013  . CORONARY ARTERY BYPASS GRAFT N/A 08/01/2018   Procedure: CORONARY ARTERY BYPASS GRAFTING (CABG) TIMES THREE USING LEFT INTERNAL MAMMARY ARTERY TO LAD, RIGHT GREATER SAPHENOUS VEIN GRAFT TO OM AND RCA, VEIN HARVESTED ENDOSCOPICALLY;  Surgeon: Grace Isaac, MD;  Location: [place];  Service: Open Heart Surgery;  Laterality: N/A;  . CORONARY STENT PLACEMENT  2007, 2011  . infantile paralysis facial asymmetry    . LEFT HEART CATH AND CORONARY ANGIOGRAPHY N/A 05/18/2017   Procedure: Left Heart Cath and Coronary Angiography;  Surgeon: Martinique, Peter M, MD;  Location: [place] [facility];  Service: Cardiovascular;  Laterality: N/A;  . LEFT HEART CATH AND CORONARY ANGIOGRAPHY N/A 07/27/2018   Procedure: LEFT HEART CATH AND CORONARY ANGIOGRAPHY;  Surgeon: Martinique, Peter M, MD;  Location: [place] [facility];  Service: Cardiovascular;  Laterality: N/A;  . LEFT HEART CATHETERIZATION WITH CORONARY ANGIOGRAM N/A 10/02/2012   Procedure: LEFT HEART CATHETERIZATION WITH CORONARY ANGIOGRAM;  Surgeon: Peter M Martinique, MD;   Location: [facility] CATH LAB;  Service: Cardiovascular;  Laterality: N/A;  . LEFT HEART CATHETERIZATION WITH CORONARY ANGIOGRAM N/A 02/20/2013   Procedure: LEFT HEART CATHETERIZATION WITH CORONARY ANGIOGRAM;  Surgeon: Burnell Blanks, MD;  Location: [facility] CATH LAB;  Service: Cardiovascular;  Laterality: N/A;  . LEFT HEART CATHETERIZATION WITH CORONARY ANGIOGRAM N/A 12/26/2013   Procedure: LEFT HEART CATHETERIZATION WITH CORONARY ANGIOGRAM;  Surgeon: Burnell Blanks, MD;  Location: [facility] CATH LAB;  Service: Cardiovascular;  Laterality: N/A;  . LEFT HEART CATHETERIZATION WITH CORONARY ANGIOGRAM N/A 11/28/2014   Procedure: LEFT HEART CATHETERIZATION WITH CORONARY ANGIOGRAM;  Surgeon: Burnell Blanks, MD;  Location: [facility] CATH LAB;  Service: Cardiovascular;  Laterality: N/A;  . PERCUTANEOUS CORONARY STENT INTERVENTION (PCI-S)  10/02/2012   Procedure: PERCUTANEOUS CORONARY STENT INTERVENTION (PCI-S);  Surgeon: Peter M Martinique, MD;  Location: [facility] CATH LAB;  Service: Cardiovascular;;  . PERCUTANEOUS CORONARY STENT INTERVENTION (PCI-S)  02/20/2013   Procedure: PERCUTANEOUS CORONARY STENT INTERVENTION (PCI-S);  Surgeon: Burnell Blanks, MD;  Location: [facility] CATH LAB;  Service: Cardiovascular;;  . PERCUTANEOUS CORONARY STENT INTERVENTION (PCI-S)  12/26/2013   Procedure: PERCUTANEOUS CORONARY STENT INTERVENTION (PCI-S);  Surgeon: Burnell Blanks, MD;  Location: [facility] CATH LAB;  Service: Cardiovascular;;  . TEE WITHOUT CARDIOVERSION N/A 08/01/2018   Procedure: TRANSESOPHAGEAL ECHOCARDIOGRAM (TEE);  Surgeon: Grace Isaac, MD;  Location: [place];  Service: Open Heart Surgery;  Laterality: N/A;  . WISDOM TOOTH EXTRACTION      REVIEW OF SYSTEMS:   Review of Systems  Constitutional: Negative for appetite change, chills, fatigue, fever and unexpected weight change.  HENT:   Negative for mouth sores, nosebleeds, sore throat and trouble swallowing.   Eyes: Negative for eye problems and icterus.   Respiratory: Negative for cough, hemoptysis, shortness of breath and wheezing.   Cardiovascular: Negative for chest pain and leg swelling.  Gastrointestinal: Negative for abdominal pain, constipation, diarrhea, nausea and vomiting.  Genitourinary: Negative for bladder incontinence, difficulty urinating, dysuria, frequency and hematuria.   Musculoskeletal: Negative for back pain, gait problem, neck pain and neck stiffness.  Skin: Negative for itching and rash.  Neurological: Negative for dizziness, extremity weakness, gait problem,  headaches, light-headedness and seizures.  Hematological: Negative for adenopathy. Does not bruise/bleed easily.  Psychiatric/Behavioral: Negative for confusion, depression and sleep disturbance. The patient is not nervous/anxious.     PHYSICAL EXAMINATION:  Blood pressure (!) 117/54, pulse 91, temperature 98.4 F (36.9 C), temperature source Oral, resp. rate 18, height 5\' 11"  (1.803 m), weight 176 lb 11.2 oz (80.2 kg), SpO2 100 %.  ECOG PERFORMANCE STATUS: 1 - Symptomatic but completely ambulatory  Physical Exam  Constitutional: Oriented to person, place, and time and well-developed, well-nourished, and in no distress. No distress.  HENT:  Head: Normocephalic and atraumatic.  Mouth/Throat: Oropharynx is clear and moist. No oropharyngeal exudate.  Eyes: Conjunctivae are normal. Right eye exhibits no discharge. Left eye exhibits no discharge. No scleral icterus.  Neck: Normal range of motion. Neck supple.  Cardiovascular: Normal rate, regular rhythm, normal heart sounds and intact distal pulses.   Pulmonary/Chest: Effort normal and breath sounds normal. No respiratory distress. No wheezes. No rales.  Abdominal: Soft. Bowel sounds are normal. Exhibits no distension and no mass. There is no tenderness.  Musculoskeletal: Normal range of motion. Exhibits no edema.  Lymphadenopathy:    No cervical adenopathy.  Neurological: Alert and oriented to person, place, and  time. Exhibits normal muscle tone. Gait normal. Coordination normal.  Skin: Skin is warm and dry. No rash noted. Not diaphoretic. No erythema. No pallor.  Psychiatric: Mood, memory and judgment normal.  Vitals reviewed.  LABORATORY DATA: Lab Results  Component Value Date   WBC 10.5 10/11/2018   HGB 9.2 (L) 10/11/2018   HCT 29.4 (L) 10/11/2018   MCV 94.5 10/11/2018   PLT 293 10/11/2018      Chemistry      Component Value Date/Time   NA 140 10/11/2018 0833   NA 126 (L) 08/24/2018 1237   K 3.9 10/11/2018 0833   CL 104 10/11/2018 0833   CO2 25 10/11/2018 0833   BUN 15 10/11/2018 0833   BUN 19 08/24/2018 1237   CREATININE 1.50 (H) 10/11/2018 0833   CREATININE 1.67 (H) 10/30/2016 1033      Component Value Date/Time   CALCIUM 9.3 10/11/2018 0833   ALKPHOS 88 10/11/2018 0833   AST 16 10/11/2018 0833   ALT 15 10/11/2018 0833   BILITOT 0.5 10/11/2018 0833       RADIOGRAPHIC STUDIES:  Dg Chest 2 View  Result Date: 09/15/2018 CLINICAL DATA:  History of prior coronary bypass grafting EXAM: CHEST - 2 VIEW COMPARISON:  08/15/2018 FINDINGS: Cardiac shadow is within normal limits. Postsurgical changes are again seen and stable. The lungs are well aerated bilaterally with significant improved aeration in the right upper lobe when compared with the prior exam. Decreased bulky soft tissue density in the right paratracheal region is noted as well. Minimal blunting of the posterior costophrenic angles is noted likely related to small effusion. The bony structures show degenerative change of the thoracic spine. IMPRESSION: Small pleural effusions bilaterally. Postoperative changes are again identified and stable. Improved aeration in the right upper lobe when compare with the prior exam. Decrease in soft tissue prominence in the right paratracheal region when compare with the prior study. Electronically Signed   By: Inez Catalina M.D.   On: 09/15/2018 14:12     ASSESSMENT/PLAN:  Extensive  stage primary small cell carcinoma of lung (HCC) This is a very pleasant 77 year old white male recently diagnosed with extensive stage small cell lung cancer.  He is currently on treatment with carboplatin for an AUC  of 5, etoposide 100 mg meter squared, and Tecentriq 1200 mg IV every 3 weeks.  Status post 2 cycles which she is tolerating fairly well.  Labs from today been reviewed.  Recommend for him to proceed with cycle #3 today as scheduled.  Creatinine is 1.5 which is consistent with baseline and adequate to proceed.  He will have weekly labs.  The patient will have a restaging CT scan of the chest, abdomen, pelvis in approximately 3 weeks and follow-up 1 to 2 days after the CT scan to discuss the results.  He was advised to call immediately if he has any concerning symptoms in the interval. The patient voices understanding of current disease status and treatment options and is in agreement with the current care plan.  All questions were answered. The patient knows to call the clinic with any problems, questions or concerns. We can certainly see the patient much sooner if necessary.   Orders Placed This Encounter  Procedures  . CT ABDOMEN PELVIS W CONTRAST    Standing Status:   Future    Standing Expiration Date:   10/12/2019    Order Specific Question:   If indicated for the ordered procedure, I authorize the administration of contrast media per Radiology protocol    Answer:   Yes    Order Specific Question:   Preferred imaging location?    Answer:   [facility]    Order Specific Question:   Radiology Contrast Protocol - do NOT remove file path    Answer:   [email or web address]    Order Specific Question:   ** REASON FOR EXAM (FREE TEXT)    Answer:   Lung cancer. Restaging.  . CT CHEST W CONTRAST    Standing Status:   Future    Standing Expiration Date:   10/12/2019    Order Specific Question:   If indicated for the ordered procedure, I authorize the  administration of contrast media per Radiology protocol    Answer:   Yes    Order Specific Question:   Preferred imaging location?    Answer:   [facility]    Order Specific Question:   Radiology Contrast Protocol - do NOT remove file path    Answer:   [email or web address]    Order Specific Question:   ** REASON FOR EXAM (FREE TEXT)    Answer:   Lung cancer. Restaging.     Mikey Bussing, DNP, AGPCNP-BC, AOCNP 10/11/18

## 2018-10-11 NOTE — Assessment & Plan Note (Addendum)
This is a very pleasant 77 year old white male recently diagnosed with extensive stage small cell lung cancer.  He is currently on treatment with carboplatin for an AUC of 5, etoposide 100 mg meter squared, and Tecentriq 1200 mg IV every 3 weeks.  Status post 2 cycles which she is tolerating fairly well.  Labs from today been reviewed.  Recommend for him to proceed with cycle #3 today as scheduled.  Creatinine is 1.5 which is consistent with baseline and adequate to proceed.  He will have weekly labs.  The patient will have a restaging CT scan of the chest, abdomen, pelvis in approximately 3 weeks and follow-up 1 to 2 days after the CT scan to discuss the results.  He was advised to call immediately if he has any concerning symptoms in the interval. The patient voices understanding of current disease status and treatment options and is in agreement with the current care plan.  All questions were answered. The patient knows to call the clinic with any problems, questions or concerns. We can certainly see the patient much sooner if necessary.

## 2018-10-12 ENCOUNTER — Inpatient Hospital Stay: Payer: BLUE CROSS/BLUE SHIELD

## 2018-10-12 ENCOUNTER — Telehealth: Payer: Self-pay | Admitting: Oncology

## 2018-10-12 ENCOUNTER — Ambulatory Visit: Payer: Medicare HMO

## 2018-10-12 VITALS — BP 105/51 | HR 87 | Temp 97.8°F | Resp 18

## 2018-10-12 DIAGNOSIS — I4891 Unspecified atrial fibrillation: Secondary | ICD-10-CM | POA: Diagnosis not present

## 2018-10-12 DIAGNOSIS — C3411 Malignant neoplasm of upper lobe, right bronchus or lung: Secondary | ICD-10-CM | POA: Diagnosis not present

## 2018-10-12 DIAGNOSIS — C771 Secondary and unspecified malignant neoplasm of intrathoracic lymph nodes: Secondary | ICD-10-CM | POA: Diagnosis not present

## 2018-10-12 DIAGNOSIS — Z7689 Persons encountering health services in other specified circumstances: Secondary | ICD-10-CM | POA: Diagnosis not present

## 2018-10-12 DIAGNOSIS — C7989 Secondary malignant neoplasm of other specified sites: Secondary | ICD-10-CM | POA: Diagnosis not present

## 2018-10-12 DIAGNOSIS — C3490 Malignant neoplasm of unspecified part of unspecified bronchus or lung: Secondary | ICD-10-CM

## 2018-10-12 DIAGNOSIS — D649 Anemia, unspecified: Secondary | ICD-10-CM | POA: Diagnosis not present

## 2018-10-12 DIAGNOSIS — Z5112 Encounter for antineoplastic immunotherapy: Secondary | ICD-10-CM | POA: Diagnosis not present

## 2018-10-12 DIAGNOSIS — J90 Pleural effusion, not elsewhere classified: Secondary | ICD-10-CM | POA: Diagnosis not present

## 2018-10-12 DIAGNOSIS — Z5111 Encounter for antineoplastic chemotherapy: Secondary | ICD-10-CM | POA: Diagnosis not present

## 2018-10-12 MED ORDER — DEXAMETHASONE SODIUM PHOSPHATE 10 MG/ML IJ SOLN
10.0000 mg | Freq: Once | INTRAMUSCULAR | Status: AC
  Administered 2018-10-12: 10 mg via INTRAVENOUS

## 2018-10-12 MED ORDER — DEXAMETHASONE SODIUM PHOSPHATE 10 MG/ML IJ SOLN
INTRAMUSCULAR | Status: AC
  Filled 2018-10-12: qty 1

## 2018-10-12 MED ORDER — SODIUM CHLORIDE 0.9 % IV SOLN
Freq: Once | INTRAVENOUS | Status: AC
  Administered 2018-10-12: 12:00:00 via INTRAVENOUS
  Filled 2018-10-12: qty 250

## 2018-10-12 MED ORDER — SODIUM CHLORIDE 0.9 % IV SOLN
96.0000 mg/m2 | Freq: Once | INTRAVENOUS | Status: AC
  Administered 2018-10-12: 200 mg via INTRAVENOUS
  Filled 2018-10-12: qty 10

## 2018-10-12 NOTE — Telephone Encounter (Signed)
Scheduled appt per 12/10 los - pt to get an updated schedule next visit.

## 2018-10-12 NOTE — Progress Notes (Signed)
FMLA successfully faxed to [employer] at [phone]. Mailed copy to patient address on file.

## 2018-10-12 NOTE — Patient Instructions (Signed)
[facility] Discharge Instructions for Patients Receiving Chemotherapy  Today you received the following chemotherapy agents Etoposide  To help prevent nausea and vomiting after your treatment, we encourage you to take your nausea medication as directed   If you develop nausea and vomiting that is not controlled by your nausea medication, call the clinic.   BELOW ARE SYMPTOMS THAT SHOULD BE REPORTED IMMEDIATELY:  *FEVER GREATER THAN 100.5 F  *CHILLS WITH OR WITHOUT FEVER  NAUSEA AND VOMITING THAT IS NOT CONTROLLED WITH YOUR NAUSEA MEDICATION  *UNUSUAL SHORTNESS OF BREATH  *UNUSUAL BRUISING OR BLEEDING  TENDERNESS IN MOUTH AND THROAT WITH OR WITHOUT PRESENCE OF ULCERS  *URINARY PROBLEMS  *BOWEL PROBLEMS  UNUSUAL RASH Items with * indicate a potential emergency and should be followed up as soon as possible.  Feel free to call the clinic should you have any questions or concerns. The clinic phone number is [phone].  Please show the [place] at check-in to the Emergency Department and triage nurse.

## 2018-10-13 ENCOUNTER — Ambulatory Visit: Payer: Medicare HMO

## 2018-10-13 ENCOUNTER — Inpatient Hospital Stay: Payer: BLUE CROSS/BLUE SHIELD

## 2018-10-13 VITALS — BP 124/55 | HR 73 | Temp 97.5°F | Resp 18

## 2018-10-13 DIAGNOSIS — C3411 Malignant neoplasm of upper lobe, right bronchus or lung: Secondary | ICD-10-CM | POA: Diagnosis not present

## 2018-10-13 DIAGNOSIS — J90 Pleural effusion, not elsewhere classified: Secondary | ICD-10-CM | POA: Diagnosis not present

## 2018-10-13 DIAGNOSIS — Z5112 Encounter for antineoplastic immunotherapy: Secondary | ICD-10-CM | POA: Diagnosis not present

## 2018-10-13 DIAGNOSIS — Z7689 Persons encountering health services in other specified circumstances: Secondary | ICD-10-CM | POA: Diagnosis not present

## 2018-10-13 DIAGNOSIS — C3490 Malignant neoplasm of unspecified part of unspecified bronchus or lung: Secondary | ICD-10-CM

## 2018-10-13 DIAGNOSIS — I4891 Unspecified atrial fibrillation: Secondary | ICD-10-CM | POA: Diagnosis not present

## 2018-10-13 DIAGNOSIS — D649 Anemia, unspecified: Secondary | ICD-10-CM | POA: Diagnosis not present

## 2018-10-13 DIAGNOSIS — Z5111 Encounter for antineoplastic chemotherapy: Secondary | ICD-10-CM | POA: Diagnosis not present

## 2018-10-13 DIAGNOSIS — C771 Secondary and unspecified malignant neoplasm of intrathoracic lymph nodes: Secondary | ICD-10-CM | POA: Diagnosis not present

## 2018-10-13 DIAGNOSIS — C7989 Secondary malignant neoplasm of other specified sites: Secondary | ICD-10-CM | POA: Diagnosis not present

## 2018-10-13 MED ORDER — SODIUM CHLORIDE 0.9 % IV SOLN
Freq: Once | INTRAVENOUS | Status: DC
  Filled 2018-10-13: qty 250

## 2018-10-13 MED ORDER — DEXAMETHASONE SODIUM PHOSPHATE 10 MG/ML IJ SOLN
10.0000 mg | Freq: Once | INTRAMUSCULAR | Status: AC
  Administered 2018-10-13: 10 mg via INTRAVENOUS

## 2018-10-13 MED ORDER — SODIUM CHLORIDE 0.9 % IV SOLN
95.0000 mg/m2 | Freq: Once | INTRAVENOUS | Status: AC
  Administered 2018-10-13: 200 mg via INTRAVENOUS
  Filled 2018-10-13: qty 10

## 2018-10-13 MED ORDER — DEXAMETHASONE SODIUM PHOSPHATE 10 MG/ML IJ SOLN
INTRAMUSCULAR | Status: AC
  Filled 2018-10-13: qty 1

## 2018-10-13 NOTE — Patient Instructions (Signed)
[facility] Discharge Instructions for Patients Receiving Chemotherapy  Today you received the following chemotherapy agents Etoposide  To help prevent nausea and vomiting after your treatment, we encourage you to take your nausea medication as directed   If you develop nausea and vomiting that is not controlled by your nausea medication, call the clinic.   BELOW ARE SYMPTOMS THAT SHOULD BE REPORTED IMMEDIATELY:  *FEVER GREATER THAN 100.5 F  *CHILLS WITH OR WITHOUT FEVER  NAUSEA AND VOMITING THAT IS NOT CONTROLLED WITH YOUR NAUSEA MEDICATION  *UNUSUAL SHORTNESS OF BREATH  *UNUSUAL BRUISING OR BLEEDING  TENDERNESS IN MOUTH AND THROAT WITH OR WITHOUT PRESENCE OF ULCERS  *URINARY PROBLEMS  *BOWEL PROBLEMS  UNUSUAL RASH Items with * indicate a potential emergency and should be followed up as soon as possible.  Feel free to call the clinic should you have any questions or concerns. The clinic phone number is [phone].  Please show the [place] at check-in to the Emergency Department and triage nurse.

## 2018-10-14 ENCOUNTER — Ambulatory Visit: Payer: Medicare HMO

## 2018-10-14 DIAGNOSIS — Z7689 Persons encountering health services in other specified circumstances: Secondary | ICD-10-CM | POA: Diagnosis not present

## 2018-10-14 DIAGNOSIS — I2510 Atherosclerotic heart disease of native coronary artery without angina pectoris: Secondary | ICD-10-CM | POA: Diagnosis not present

## 2018-10-14 DIAGNOSIS — I255 Ischemic cardiomyopathy: Secondary | ICD-10-CM | POA: Diagnosis not present

## 2018-10-14 DIAGNOSIS — C7989 Secondary malignant neoplasm of other specified sites: Secondary | ICD-10-CM | POA: Diagnosis not present

## 2018-10-14 DIAGNOSIS — C3411 Malignant neoplasm of upper lobe, right bronchus or lung: Secondary | ICD-10-CM | POA: Diagnosis present

## 2018-10-14 DIAGNOSIS — Z5111 Encounter for antineoplastic chemotherapy: Secondary | ICD-10-CM | POA: Diagnosis not present

## 2018-10-14 DIAGNOSIS — C771 Secondary and unspecified malignant neoplasm of intrathoracic lymph nodes: Secondary | ICD-10-CM | POA: Diagnosis not present

## 2018-10-14 DIAGNOSIS — Z5112 Encounter for antineoplastic immunotherapy: Secondary | ICD-10-CM | POA: Diagnosis not present

## 2018-10-14 DIAGNOSIS — I5042 Chronic combined systolic (congestive) and diastolic (congestive) heart failure: Secondary | ICD-10-CM | POA: Diagnosis not present

## 2018-10-14 DIAGNOSIS — J90 Pleural effusion, not elsewhere classified: Secondary | ICD-10-CM | POA: Diagnosis not present

## 2018-10-14 DIAGNOSIS — I4891 Unspecified atrial fibrillation: Secondary | ICD-10-CM | POA: Diagnosis not present

## 2018-10-14 DIAGNOSIS — N529 Male erectile dysfunction, unspecified: Secondary | ICD-10-CM | POA: Diagnosis not present

## 2018-10-14 DIAGNOSIS — D649 Anemia, unspecified: Secondary | ICD-10-CM | POA: Diagnosis not present

## 2018-10-14 DIAGNOSIS — I110 Hypertensive heart disease with heart failure: Secondary | ICD-10-CM | POA: Diagnosis not present

## 2018-10-14 DIAGNOSIS — E785 Hyperlipidemia, unspecified: Secondary | ICD-10-CM | POA: Diagnosis not present

## 2018-10-14 DIAGNOSIS — I252 Old myocardial infarction: Secondary | ICD-10-CM | POA: Diagnosis not present

## 2018-10-14 DIAGNOSIS — E119 Type 2 diabetes mellitus without complications: Secondary | ICD-10-CM | POA: Diagnosis not present

## 2018-10-14 DIAGNOSIS — I480 Paroxysmal atrial fibrillation: Secondary | ICD-10-CM | POA: Diagnosis not present

## 2018-10-14 DIAGNOSIS — Z7901 Long term (current) use of anticoagulants: Secondary | ICD-10-CM | POA: Diagnosis not present

## 2018-10-14 DIAGNOSIS — Z7982 Long term (current) use of aspirin: Secondary | ICD-10-CM | POA: Diagnosis not present

## 2018-10-15 ENCOUNTER — Inpatient Hospital Stay: Payer: BLUE CROSS/BLUE SHIELD

## 2018-10-15 VITALS — BP 117/52 | HR 97 | Temp 97.5°F | Resp 18

## 2018-10-15 DIAGNOSIS — Z5112 Encounter for antineoplastic immunotherapy: Secondary | ICD-10-CM | POA: Diagnosis not present

## 2018-10-15 DIAGNOSIS — I4891 Unspecified atrial fibrillation: Secondary | ICD-10-CM | POA: Diagnosis not present

## 2018-10-15 DIAGNOSIS — Z7689 Persons encountering health services in other specified circumstances: Secondary | ICD-10-CM | POA: Diagnosis not present

## 2018-10-15 DIAGNOSIS — C3411 Malignant neoplasm of upper lobe, right bronchus or lung: Secondary | ICD-10-CM | POA: Diagnosis not present

## 2018-10-15 DIAGNOSIS — C771 Secondary and unspecified malignant neoplasm of intrathoracic lymph nodes: Secondary | ICD-10-CM | POA: Diagnosis not present

## 2018-10-15 DIAGNOSIS — C7989 Secondary malignant neoplasm of other specified sites: Secondary | ICD-10-CM | POA: Diagnosis not present

## 2018-10-15 DIAGNOSIS — J90 Pleural effusion, not elsewhere classified: Secondary | ICD-10-CM | POA: Diagnosis not present

## 2018-10-15 DIAGNOSIS — Z5111 Encounter for antineoplastic chemotherapy: Secondary | ICD-10-CM | POA: Diagnosis not present

## 2018-10-15 DIAGNOSIS — D649 Anemia, unspecified: Secondary | ICD-10-CM | POA: Diagnosis not present

## 2018-10-15 DIAGNOSIS — C3490 Malignant neoplasm of unspecified part of unspecified bronchus or lung: Secondary | ICD-10-CM

## 2018-10-15 MED ORDER — PEGFILGRASTIM-CBQV 6 MG/0.6ML ~~LOC~~ SOSY
6.0000 mg | PREFILLED_SYRINGE | Freq: Once | SUBCUTANEOUS | Status: AC
  Administered 2018-10-15: 6 mg via SUBCUTANEOUS

## 2018-10-15 MED ORDER — PEGFILGRASTIM-CBQV 6 MG/0.6ML ~~LOC~~ SOSY
PREFILLED_SYRINGE | SUBCUTANEOUS | Status: AC
  Filled 2018-10-15: qty 0.6

## 2018-10-15 NOTE — Patient Instructions (Signed)
Pegfilgrastim injection What is this medicine? PEGFILGRASTIM (PEG fil gra stim) is a long-acting granulocyte colony-stimulating factor that stimulates the growth of neutrophils, a type of white blood cell important in the body's fight against infection. It is used to reduce the incidence of fever and infection in patients with certain types of cancer who are receiving chemotherapy that affects the bone marrow, and to increase survival after being exposed to high doses of radiation. This medicine may be used for other purposes; ask your health care provider or pharmacist if you have questions. COMMON BRAND NAME(S): Neulasta What should I tell my health care provider before I take this medicine? They need to know if you have any of these conditions: -kidney disease -latex allergy -ongoing radiation therapy -sickle cell disease -skin reactions to acrylic adhesives (On-Body Injector only) -an unusual or allergic reaction to pegfilgrastim, filgrastim, other medicines, foods, dyes, or preservatives -pregnant or trying to get pregnant -breast-feeding How should I use this medicine? This medicine is for injection under the skin. If you get this medicine at home, you will be taught how to prepare and give the pre-filled syringe or how to use the On-body Injector. Refer to the patient Instructions for Use for detailed instructions. Use exactly as directed. Tell your healthcare provider immediately if you suspect that the On-body Injector may not have performed as intended or if you suspect the use of the On-body Injector resulted in a missed or partial dose. It is important that you put your used needles and syringes in a special sharps container. Do not put them in a trash can. If you do not have a sharps container, call your pharmacist or healthcare provider to get one. Talk to your pediatrician regarding the use of this medicine in children. While this drug may be prescribed for selected conditions,  precautions do apply. Overdosage: If you think you have taken too much of this medicine contact a poison control center or emergency room at once. NOTE: This medicine is only for you. Do not share this medicine with others. What if I miss a dose? It is important not to miss your dose. Call your doctor or health care professional if you miss your dose. If you miss a dose due to an On-body Injector failure or leakage, a new dose should be administered as soon as possible using a single prefilled syringe for manual use. What may interact with this medicine? Interactions have not been studied. Give your health care provider a list of all the medicines, herbs, non-prescription drugs, or dietary supplements you use. Also tell them if you smoke, drink alcohol, or use illegal drugs. Some items may interact with your medicine. This list may not describe all possible interactions. Give your health care provider a list of all the medicines, herbs, non-prescription drugs, or dietary supplements you use. Also tell them if you smoke, drink alcohol, or use illegal drugs. Some items may interact with your medicine. What should I watch for while using this medicine? You may need blood work done while you are taking this medicine. If you are going to need a MRI, CT scan, or other procedure, tell your doctor that you are using this medicine (On-Body Injector only). What side effects may I notice from receiving this medicine? Side effects that you should report to your doctor or health care professional as soon as possible: -allergic reactions like skin rash, itching or hives, swelling of the face, lips, or tongue -dizziness -fever -pain, redness, or irritation at site   where injected -pinpoint red spots on the skin -red or dark-brown urine -shortness of breath or breathing problems -stomach or side pain, or pain at the shoulder -swelling -tiredness -trouble passing urine or change in the amount of urine Side  effects that usually do not require medical attention (report to your doctor or health care professional if they continue or are bothersome): -bone pain -muscle pain This list may not describe all possible side effects. Call your doctor for medical advice about side effects. You may report side effects to FDA at 1-800-FDA-1088. Where should I keep my medicine? Keep out of the reach of children. Store pre-filled syringes in a refrigerator between 2 and 8 degrees C (36 and 46 degrees F). Do not freeze. Keep in carton to protect from light. Throw away this medicine if it is left out of the refrigerator for more than 48 hours. Throw away any unused medicine after the expiration date. NOTE: This sheet is a summary. It may not cover all possible information. If you have questions about this medicine, talk to your doctor, pharmacist, or health care provider.  2018 Elsevier/Gold Standard (2016-10-15 12:58:03)  

## 2018-10-17 ENCOUNTER — Inpatient Hospital Stay: Payer: BLUE CROSS/BLUE SHIELD

## 2018-10-17 DIAGNOSIS — Z5112 Encounter for antineoplastic immunotherapy: Secondary | ICD-10-CM | POA: Diagnosis not present

## 2018-10-17 DIAGNOSIS — I4891 Unspecified atrial fibrillation: Secondary | ICD-10-CM | POA: Diagnosis not present

## 2018-10-17 DIAGNOSIS — Z5111 Encounter for antineoplastic chemotherapy: Secondary | ICD-10-CM | POA: Diagnosis not present

## 2018-10-17 DIAGNOSIS — J90 Pleural effusion, not elsewhere classified: Secondary | ICD-10-CM | POA: Diagnosis not present

## 2018-10-17 DIAGNOSIS — C7989 Secondary malignant neoplasm of other specified sites: Secondary | ICD-10-CM | POA: Diagnosis not present

## 2018-10-17 DIAGNOSIS — R5382 Chronic fatigue, unspecified: Secondary | ICD-10-CM

## 2018-10-17 DIAGNOSIS — C771 Secondary and unspecified malignant neoplasm of intrathoracic lymph nodes: Secondary | ICD-10-CM | POA: Diagnosis not present

## 2018-10-17 DIAGNOSIS — C3411 Malignant neoplasm of upper lobe, right bronchus or lung: Secondary | ICD-10-CM | POA: Diagnosis not present

## 2018-10-17 DIAGNOSIS — D649 Anemia, unspecified: Secondary | ICD-10-CM | POA: Diagnosis not present

## 2018-10-17 DIAGNOSIS — C3491 Malignant neoplasm of unspecified part of right bronchus or lung: Secondary | ICD-10-CM

## 2018-10-17 DIAGNOSIS — Z7689 Persons encountering health services in other specified circumstances: Secondary | ICD-10-CM | POA: Diagnosis not present

## 2018-10-17 LAB — CBC WITH DIFFERENTIAL (CANCER CENTER ONLY)
Abs Immature Granulocytes: 0 10*3/uL (ref 0.00–0.07)
BASOS ABS: 0 10*3/uL (ref 0.0–0.1)
Band Neutrophils: 8 %
Basophils Relative: 0 %
EOS PCT: 2 %
Eosinophils Absolute: 0.9 10*3/uL — ABNORMAL HIGH (ref 0.0–0.5)
HEMATOCRIT: 26.3 % — AB (ref 39.0–52.0)
Hemoglobin: 8.4 g/dL — ABNORMAL LOW (ref 13.0–17.0)
Lymphocytes Relative: 0 %
Lymphs Abs: 0 10*3/uL — ABNORMAL LOW (ref 0.7–4.0)
MCH: 30.2 pg (ref 26.0–34.0)
MCHC: 31.9 g/dL (ref 30.0–36.0)
MCV: 94.6 fL (ref 80.0–100.0)
Monocytes Absolute: 0.5 10*3/uL (ref 0.1–1.0)
Monocytes Relative: 1 %
Neutro Abs: 44.7 10*3/uL — ABNORMAL HIGH (ref 1.7–17.7)
Neutrophils Relative %: 89 %
Platelet Count: 142 10*3/uL — ABNORMAL LOW (ref 150–400)
RBC: 2.78 MIL/uL — ABNORMAL LOW (ref 4.22–5.81)
RDW: 19.3 % — ABNORMAL HIGH (ref 11.5–15.5)
WBC Count: 46.1 10*3/uL — ABNORMAL HIGH (ref 4.0–10.5)
nRBC: 0 % (ref 0.0–0.2)

## 2018-10-17 LAB — CMP (CANCER CENTER ONLY)
ALT: 23 U/L (ref 0–44)
ANION GAP: 11 (ref 5–15)
AST: 19 U/L (ref 15–41)
Albumin: 3.6 g/dL (ref 3.5–5.0)
Alkaline Phosphatase: 95 U/L (ref 38–126)
BUN: 20 mg/dL (ref 8–23)
CO2: 25 mmol/L (ref 22–32)
Calcium: 9 mg/dL (ref 8.9–10.3)
Chloride: 104 mmol/L (ref 98–111)
Creatinine: 1.36 mg/dL — ABNORMAL HIGH (ref 0.61–1.24)
GFR, Est AFR Am: 58 mL/min — ABNORMAL LOW (ref >60)
GFR, Estimated: 50 mL/min — ABNORMAL LOW (ref >60)
Glucose, Bld: 121 mg/dL — ABNORMAL HIGH (ref 70–99)
Potassium: 4 mmol/L (ref 3.5–5.1)
Sodium: 140 mmol/L (ref 135–145)
Total Bilirubin: 0.5 mg/dL (ref 0.3–1.2)
Total Protein: 6.2 g/dL — ABNORMAL LOW (ref 6.5–8.1)

## 2018-10-17 LAB — TSH: TSH: 0.235 u[IU]/mL — ABNORMAL LOW (ref 0.320–4.118)

## 2018-10-24 ENCOUNTER — Inpatient Hospital Stay: Payer: BLUE CROSS/BLUE SHIELD

## 2018-10-24 DIAGNOSIS — Z5111 Encounter for antineoplastic chemotherapy: Secondary | ICD-10-CM | POA: Diagnosis not present

## 2018-10-24 DIAGNOSIS — Z7689 Persons encountering health services in other specified circumstances: Secondary | ICD-10-CM | POA: Diagnosis not present

## 2018-10-24 DIAGNOSIS — D649 Anemia, unspecified: Secondary | ICD-10-CM | POA: Diagnosis not present

## 2018-10-24 DIAGNOSIS — C7989 Secondary malignant neoplasm of other specified sites: Secondary | ICD-10-CM | POA: Diagnosis not present

## 2018-10-24 DIAGNOSIS — C3411 Malignant neoplasm of upper lobe, right bronchus or lung: Secondary | ICD-10-CM | POA: Diagnosis not present

## 2018-10-24 DIAGNOSIS — C3491 Malignant neoplasm of unspecified part of right bronchus or lung: Secondary | ICD-10-CM

## 2018-10-24 DIAGNOSIS — I4891 Unspecified atrial fibrillation: Secondary | ICD-10-CM | POA: Diagnosis not present

## 2018-10-24 DIAGNOSIS — Z5112 Encounter for antineoplastic immunotherapy: Secondary | ICD-10-CM | POA: Diagnosis not present

## 2018-10-24 DIAGNOSIS — J90 Pleural effusion, not elsewhere classified: Secondary | ICD-10-CM | POA: Diagnosis not present

## 2018-10-24 DIAGNOSIS — C771 Secondary and unspecified malignant neoplasm of intrathoracic lymph nodes: Secondary | ICD-10-CM | POA: Diagnosis not present

## 2018-10-24 LAB — CBC WITH DIFFERENTIAL (CANCER CENTER ONLY)
Abs Immature Granulocytes: 0.6 10*3/uL — ABNORMAL HIGH (ref 0.00–0.07)
Band Neutrophils: 6 %
Basophils Absolute: 0.1 10*3/uL (ref 0.0–0.1)
Basophils Relative: 1 %
EOS ABS: 0.2 10*3/uL (ref 0.0–0.5)
Eosinophils Relative: 2 %
HCT: 28.2 % — ABNORMAL LOW (ref 39.0–52.0)
Hemoglobin: 8.6 g/dL — ABNORMAL LOW (ref 13.0–17.0)
Lymphocytes Relative: 24 %
Lymphs Abs: 2.3 10*3/uL (ref 0.7–4.0)
MCH: 30.2 pg (ref 26.0–34.0)
MCHC: 30.5 g/dL (ref 30.0–36.0)
MCV: 98.9 fL (ref 80.0–100.0)
Metamyelocytes Relative: 4 %
Monocytes Absolute: 0.5 10*3/uL (ref 0.1–1.0)
Monocytes Relative: 5 %
Myelocytes: 2 %
NRBC: 0.5 % — AB (ref 0.0–0.2)
Neutro Abs: 6 10*3/uL (ref 1.7–17.7)
Neutrophils Relative %: 56 %
Platelet Count: 69 10*3/uL — ABNORMAL LOW (ref 150–400)
RBC: 2.85 MIL/uL — AB (ref 4.22–5.81)
RDW: 20.3 % — ABNORMAL HIGH (ref 11.5–15.5)
WBC: 9.6 10*3/uL (ref 4.0–10.5)

## 2018-10-27 ENCOUNTER — Ambulatory Visit (HOSPITAL_COMMUNITY)
Admission: RE | Admit: 2018-10-27 | Discharge: 2018-10-27 | Disposition: A | Discharge status: Home / Self Care | Payer: BLUE CROSS/BLUE SHIELD | Source: Ambulatory Visit | Attending: Oncology | Admitting: Oncology

## 2018-10-27 DIAGNOSIS — C3490 Malignant neoplasm of unspecified part of unspecified bronchus or lung: Secondary | ICD-10-CM | POA: Diagnosis not present

## 2018-10-27 DIAGNOSIS — Z5111 Encounter for antineoplastic chemotherapy: Secondary | ICD-10-CM | POA: Diagnosis not present

## 2018-10-27 DIAGNOSIS — C801 Malignant (primary) neoplasm, unspecified: Secondary | ICD-10-CM | POA: Diagnosis not present

## 2018-10-27 MED ORDER — IOHEXOL 300 MG/ML  SOLN
100.0000 mL | Freq: Once | INTRAMUSCULAR | Status: AC | PRN
  Administered 2018-10-27: 100 mL via INTRAVENOUS

## 2018-10-27 MED ORDER — SODIUM CHLORIDE (PF) 0.9 % IJ SOLN
INTRAMUSCULAR | Status: AC
  Filled 2018-10-27: qty 50

## 2018-10-28 ENCOUNTER — Telehealth: Payer: Self-pay | Admitting: Cardiovascular Disease

## 2018-10-28 ENCOUNTER — Other Ambulatory Visit: Payer: Self-pay | Admitting: Cardiovascular Disease

## 2018-10-28 DIAGNOSIS — I484 Atypical atrial flutter: Secondary | ICD-10-CM

## 2018-10-28 MED ORDER — APIXABAN 5 MG PO TABS: 5.0000 mg | ORAL_TABLET | Freq: Two times a day (BID) | ORAL | 1 refills | Status: DC

## 2018-10-28 NOTE — Telephone Encounter (Signed)
New Message   *STAT* If patient is at the pharmacy, call can be transferred to refill team.   1. Which medications need to be refilled? (please list name of each medication and dose if known) ELIQUIS 5 MG TABS tablet  2. Which pharmacy/location (including street and city if local pharmacy) is medication to be sent to? [place], [place]  3. Do they need a 30 day or 90 day supply? [place]

## 2018-10-31 ENCOUNTER — Encounter: Payer: Self-pay | Admitting: Internal Medicine

## 2018-10-31 ENCOUNTER — Inpatient Hospital Stay (HOSPITAL_BASED_OUTPATIENT_CLINIC_OR_DEPARTMENT_OTHER): Payer: BLUE CROSS/BLUE SHIELD | Admitting: Internal Medicine

## 2018-10-31 ENCOUNTER — Other Ambulatory Visit: Payer: Self-pay

## 2018-10-31 ENCOUNTER — Telehealth: Payer: Self-pay | Admitting: Pharmacist

## 2018-10-31 ENCOUNTER — Inpatient Hospital Stay: Payer: BLUE CROSS/BLUE SHIELD

## 2018-10-31 ENCOUNTER — Other Ambulatory Visit: Payer: Self-pay | Admitting: Internal Medicine

## 2018-10-31 VITALS — BP 107/51 | HR 71 | Temp 98.4°F | Resp 18 | Ht 71.0 in | Wt 178.3 lb

## 2018-10-31 DIAGNOSIS — I484 Atypical atrial flutter: Secondary | ICD-10-CM

## 2018-10-31 DIAGNOSIS — I4891 Unspecified atrial fibrillation: Secondary | ICD-10-CM

## 2018-10-31 DIAGNOSIS — C3411 Malignant neoplasm of upper lobe, right bronchus or lung: Secondary | ICD-10-CM | POA: Diagnosis not present

## 2018-10-31 DIAGNOSIS — C7989 Secondary malignant neoplasm of other specified sites: Secondary | ICD-10-CM

## 2018-10-31 DIAGNOSIS — N529 Male erectile dysfunction, unspecified: Secondary | ICD-10-CM

## 2018-10-31 DIAGNOSIS — Z7689 Persons encountering health services in other specified circumstances: Secondary | ICD-10-CM

## 2018-10-31 DIAGNOSIS — D649 Anemia, unspecified: Secondary | ICD-10-CM

## 2018-10-31 DIAGNOSIS — I252 Old myocardial infarction: Secondary | ICD-10-CM

## 2018-10-31 DIAGNOSIS — C3490 Malignant neoplasm of unspecified part of unspecified bronchus or lung: Secondary | ICD-10-CM

## 2018-10-31 DIAGNOSIS — I110 Hypertensive heart disease with heart failure: Secondary | ICD-10-CM

## 2018-10-31 DIAGNOSIS — E785 Hyperlipidemia, unspecified: Secondary | ICD-10-CM

## 2018-10-31 DIAGNOSIS — C3491 Malignant neoplasm of unspecified part of right bronchus or lung: Secondary | ICD-10-CM

## 2018-10-31 DIAGNOSIS — Z7982 Long term (current) use of aspirin: Secondary | ICD-10-CM

## 2018-10-31 DIAGNOSIS — I10 Essential (primary) hypertension: Secondary | ICD-10-CM

## 2018-10-31 DIAGNOSIS — I2510 Atherosclerotic heart disease of native coronary artery without angina pectoris: Secondary | ICD-10-CM

## 2018-10-31 DIAGNOSIS — C771 Secondary and unspecified malignant neoplasm of intrathoracic lymph nodes: Secondary | ICD-10-CM

## 2018-10-31 DIAGNOSIS — Z5112 Encounter for antineoplastic immunotherapy: Secondary | ICD-10-CM | POA: Diagnosis not present

## 2018-10-31 DIAGNOSIS — E119 Type 2 diabetes mellitus without complications: Secondary | ICD-10-CM

## 2018-10-31 DIAGNOSIS — J90 Pleural effusion, not elsewhere classified: Secondary | ICD-10-CM

## 2018-10-31 DIAGNOSIS — I480 Paroxysmal atrial fibrillation: Secondary | ICD-10-CM

## 2018-10-31 DIAGNOSIS — C9000 Multiple myeloma not having achieved remission: Secondary | ICD-10-CM

## 2018-10-31 DIAGNOSIS — Z5111 Encounter for antineoplastic chemotherapy: Secondary | ICD-10-CM

## 2018-10-31 DIAGNOSIS — I5042 Chronic combined systolic (congestive) and diastolic (congestive) heart failure: Secondary | ICD-10-CM

## 2018-10-31 DIAGNOSIS — Z7901 Long term (current) use of anticoagulants: Secondary | ICD-10-CM

## 2018-10-31 DIAGNOSIS — I255 Ischemic cardiomyopathy: Secondary | ICD-10-CM

## 2018-10-31 LAB — COMPREHENSIVE METABOLIC PANEL
ALBUMIN: 3.7 g/dL (ref 3.5–5.0)
ALT: 15 U/L (ref 0–44)
AST: 15 U/L (ref 15–41)
Alkaline Phosphatase: 89 U/L (ref 38–126)
Anion gap: 11 (ref 5–15)
BUN: 14 mg/dL (ref 8–23)
CO2: 25 mmol/L (ref 22–32)
Calcium: 8.9 mg/dL (ref 8.9–10.3)
Chloride: 104 mmol/L (ref 98–111)
Creatinine, Ser: 1.48 mg/dL — ABNORMAL HIGH (ref 0.61–1.24)
GFR calc Af Amer: 52 mL/min — ABNORMAL LOW (ref >60)
GFR calc non Af Amer: 45 mL/min — ABNORMAL LOW (ref >60)
GLUCOSE: 127 mg/dL — AB (ref 70–99)
Potassium: 4.2 mmol/L (ref 3.5–5.1)
Sodium: 140 mmol/L (ref 135–145)
Total Bilirubin: 0.4 mg/dL (ref 0.3–1.2)
Total Protein: 6.3 g/dL — ABNORMAL LOW (ref 6.5–8.1)

## 2018-10-31 LAB — CBC WITH DIFFERENTIAL (CANCER CENTER ONLY)
Abs Immature Granulocytes: 0.08 K/uL — ABNORMAL HIGH (ref 0.00–0.07)
Basophils Absolute: 0 K/uL (ref 0.0–0.1)
Basophils Relative: 0 %
Eosinophils Absolute: 0.1 K/uL (ref 0.0–0.5)
Eosinophils Relative: 1 %
HCT: 28 % — ABNORMAL LOW (ref 39.0–52.0)
Hemoglobin: 8.6 g/dL — ABNORMAL LOW (ref 13.0–17.0)
Immature Granulocytes: 1 %
Lymphocytes Relative: 11 %
Lymphs Abs: 1.1 K/uL (ref 0.7–4.0)
MCH: 29.9 pg (ref 26.0–34.0)
MCHC: 30.7 g/dL (ref 30.0–36.0)
MCV: 97.2 fL (ref 80.0–100.0)
Monocytes Absolute: 1 K/uL (ref 0.1–1.0)
Monocytes Relative: 10 %
Neutro Abs: 7.7 K/uL (ref 1.7–7.7)
Neutrophils Relative %: 77 %
Platelet Count: 198 K/uL (ref 150–400)
RBC: 2.88 MIL/uL — ABNORMAL LOW (ref 4.22–5.81)
RDW: 19.5 % — ABNORMAL HIGH (ref 11.5–15.5)
WBC Count: 9.9 K/uL (ref 4.0–10.5)
nRBC: 0 % (ref 0.0–0.2)

## 2018-10-31 MED ORDER — APIXABAN 5 MG PO TABS: 5.0000 mg | ORAL_TABLET | Freq: Two times a day (BID) | ORAL | 1 refills | Status: DC

## 2018-10-31 MED ORDER — PALONOSETRON HCL INJECTION 0.25 MG/5ML
INTRAVENOUS | Status: AC
  Filled 2018-10-31: qty 5

## 2018-10-31 MED ORDER — SODIUM CHLORIDE 0.9 % IV SOLN
410.0000 mg | Freq: Once | INTRAVENOUS | Status: AC
  Administered 2018-10-31: 410 mg via INTRAVENOUS
  Filled 2018-10-31: qty 41

## 2018-10-31 MED ORDER — SODIUM CHLORIDE 0.9 % IV SOLN
200.0000 mg | Freq: Once | INTRAVENOUS | Status: AC
  Administered 2018-10-31: 200 mg via INTRAVENOUS
  Filled 2018-10-31: qty 10

## 2018-10-31 MED ORDER — SODIUM CHLORIDE 0.9 % IV SOLN
1200.0000 mg | Freq: Once | INTRAVENOUS | Status: AC
  Administered 2018-10-31: 1200 mg via INTRAVENOUS
  Filled 2018-10-31: qty 20

## 2018-10-31 MED ORDER — PALONOSETRON HCL INJECTION 0.25 MG/5ML
0.2500 mg | Freq: Once | INTRAVENOUS | Status: AC
  Administered 2018-10-31: 0.25 mg via INTRAVENOUS

## 2018-10-31 MED ORDER — SODIUM CHLORIDE 0.9 % IV SOLN
Freq: Once | INTRAVENOUS | Status: AC
  Administered 2018-10-31: 12:00:00 via INTRAVENOUS
  Filled 2018-10-31: qty 250

## 2018-10-31 MED ORDER — SODIUM CHLORIDE 0.9 % IV SOLN
Freq: Once | INTRAVENOUS | Status: AC
  Administered 2018-10-31: 13:00:00 via INTRAVENOUS
  Filled 2018-10-31: qty 5

## 2018-10-31 NOTE — Telephone Encounter (Signed)
Pt called stating that [facility] says they did not get rx for eliquis on Friday. Sent in another Rx to [facility] in [place]

## 2018-10-31 NOTE — Progress Notes (Signed)
Per Dr. Julien Nordmann: OK to use CMP from 10/24/18. Will have lab come draw new CMP peripherally but do not need to wait for results for treatment today. Pt and wife updated on plan, and verbalized understanding/agreement.

## 2018-10-31 NOTE — Progress Notes (Signed)
Gregory Bates [phone]   Fax:(336) [place], [place] [address]  DIAGNOSIS: Extensive stage (T1 a, N2, M1a) extensive stage small cell lung cancer presented with right lung nodules in addition to mediastinal lymphadenopathy as well as pericardial metastasis diagnosed in October 2019  PRIOR THERAPY: None.  CURRENT THERAPY: palliative systemic chemotherapy with carboplatin for AUC of 5 on day 1, etoposide 100 mg/M2 on days 1, 2 and 3 in addition to Tecentriq (Atezolizumab) 1200 mg IV every 3 weeks.  Status post 3 cycles.  INTERVAL HISTORY: Gregory Bates 77 y.o. male returns to the clinic today for follow-up visit accompanied by his wife.  The patient is tolerating his systemic chemotherapy with carboplatin, etoposide and Tecentriq fairly well.  He denied having any nausea or vomiting.  He has no fever or chills.  He denied having any significant chest pain but continues to have shortness of breath at baseline increased with exertion and he is currently on home oxygen.  He has no cough or hemoptysis.  The patient denied having any significant weight loss or night sweats.  He had repeat CT scan of the chest, abdomen and pelvis performed recently and he is here for evaluation and discussion of his scan results.  MEDICAL HISTORY: Past Medical History:  Diagnosis Date  . Acute renal insufficiency    a. During 11/2013 adm with atrial flutter.  . Anemia, unspecified   . Atrial fibrillation ([place])   . CAD (coronary artery disease), native coronary artery    a. s/p cath May 2011 >> DES distal RCA;  b. 01/2013 NSTEMI >> OM1 99p (2.75x12 Promus Premier DES);  c. LHC (2/15): PCI: Promus DES to dist RCA ;  d. LHC 1/16 - dLM 11, oLAD 30, small D1 tandem 43, pOM1 stent ok, mOM 50-60, dOM bifurcation 50-60 in both vessels, AV 50-70 jailed by stent, pRCA 72 mRCA stent ok, dRCA stent ok, PLB smal 50, dPDA 60, 80;  EF 35-40 >> med Rx - PCI of PDA if angina  . Chronic combined systolic and diastolic CHF (congestive heart failure) ([place]) 12/23/2015   Echo 2/17: Mild LVH, EF 45-50%, inferior akinesis, grade 2 diastolic dysfunction, mild AI, mild MR, mild LAE, PASP 44 mmHg   . Colorectal polyps 2002  . Diabetes mellitus    AODM  . Erectile dysfunction   . History of echocardiogram    a. 01/2013 Echo: EF 55%, mid-dist inflat HK, Gr1 DD, Triv AI/TR, Mild MR.;  b.  Echo (1/15): Mild LVH, EF 55%, inferolateral hypokinesis, grade 1 diastolic dysfunction, mild AI, trivial MR, moderate LAE, PASP 36 mmHg   . Hx of cardiovascular stress test    Stress myoview 08/07/13 with LVEF 44%, inferior scar, no ischemia  . Hyperlipidemia   . Hypertension   . Hyponatremia   . Ischemic cardiomyopathy    a. EF 35-40% by LHC in 1/16; b. Echo 2/17: Mild LVH, EF 45-50%, inferior akinesis, grade 2 diastolic dysfunction, mild AI, mild MR, mild LAE, PASP 44 mmHg  . Myocardial infarction ([place])   . Oxygen deficiency   . PAF (paroxysmal atrial fibrillation) ([place])    a. Dx 11/2013. Spont conv. Placed on eliquis.  . Small cell lung cancer in adult [facility]) 07/28/2018    ALLERGIES:  is allergic to brilinta [ticagrelor] and crestor [rosuvastatin].  MEDICATIONS:  Current Outpatient Medications  Medication Sig Dispense Refill  . acetaminophen (TYLENOL)  325 MG tablet Take 2 tablets (650 mg total) by mouth every 6 (six) hours as needed for mild pain.    [name] Kitchen albuterol (PROVENTIL HFA;VENTOLIN HFA) 108 (90 Base) MCG/ACT inhaler Inhale 2 puffs into the lungs every 6 (six) hours as needed for wheezing or shortness of breath. 1 Inhaler 2  . amiodarone (PACERONE) 200 MG tablet Take 1 tablet (200 mg total) by mouth 2 (two) times daily. For one week;then take 200 mg daily thereafter (Patient taking differently: Take 200 mg by mouth See admin instructions. Take 200mg  by mouth twice daily for one week;then take 200 mg daily thereafter) 90 tablet 3  . apixaban  (ELIQUIS) 5 MG TABS tablet Take 1 tablet (5 mg total) by mouth 2 (two) times daily. 180 tablet 1  . aspirin EC 81 MG EC tablet Take 1 tablet (81 mg total) by mouth daily.    [name] Kitchen atorvastatin (LIPITOR) 20 MG tablet Take 1 tablet (20 mg total) by mouth daily. 90 tablet 3  . cholecalciferol (VITAMIN D) 1000 UNITS tablet Take 1,000 Units by mouth every evening.     . clopidogrel (PLAVIX) 75 MG tablet     . cyclobenzaprine (FLEXERIL) 5 MG tablet Take 1 tablet (5 mg total) by mouth 2 (two) times daily as needed for muscle spasms (cramping). 30 tablet 0  . fexofenadine (ALLEGRA) 180 MG tablet Take 180 mg by mouth daily as needed for allergies.     . fluticasone (FLONASE) 50 MCG/ACT nasal spray Place 1 spray into both nostrils daily as needed for allergies.     . furosemide (LASIX) 40 MG tablet Take 1 tablet (40 mg total) by mouth 2 (two) times daily. 60 tablet 3  . guaiFENesin (MUCINEX) 600 MG 12 hr tablet Take 1 tablet (600 mg total) by mouth 2 (two) times daily as needed for cough or to loosen phlegm.    . isosorbide mononitrate (IMDUR) 60 MG 24 hr tablet Take 60 mg by mouth daily.    . metoprolol tartrate (LOPRESSOR) 25 MG tablet Take 1 tablet (25 mg total) by mouth 2 (two) times daily. 180 tablet 3  . Multiple Vitamin (MULTIVITAMIN) tablet Take 1 tablet by mouth at bedtime.     . ondansetron (ZOFRAN ODT) 4 MG disintegrating tablet Take 1 tablet (4 mg total) by mouth every 8 (eight) hours as needed for nausea or vomiting. 20 tablet 0  . prochlorperazine (COMPAZINE) 10 MG tablet Take 1 tablet (10 mg total) by mouth every 6 (six) hours as needed for nausea or vomiting. 30 tablet 0  . Tiotropium Bromide-Olodaterol (STIOLTO RESPIMAT) 2.5-2.5 MCG/ACT AERS Inhale 2 puffs into the lungs daily. 1 Inhaler 5  . traMADol (ULTRAM) 50 MG tablet Take 1-2 tablets (50-100 mg total) by mouth every 6 (six) hours as needed for moderate pain or severe pain. Take 50 mg by mouth every 6-8 hours PRN for moderate pain. You can  take 2 tabs for severe pain. 30 tablet 0  . vitamin E 100 UNIT capsule Take 100 Units by mouth at bedtime.      No current facility-administered medications for this visit.     SURGICAL HISTORY:  Past Surgical History:  Procedure Laterality Date  . BIOPSY  08/01/2018   Procedure: BIOPSY OF PERICARDIAL LYMPH NODE AND HILAR LYMPH NODE;  Surgeon: Grace Isaac, MD;  Location: [facility];  Service: Open Heart Surgery;;  . CLIPPING OF ATRIAL APPENDAGE Left 08/01/2018   Procedure: CLIPPING OF ATRIAL APPENDAGE;  Surgeon: Grace Isaac, MD;  Location: [facility] OR;  Service: Open Heart Surgery;  Laterality: Left;  . COLONOSCOPY W/ POLYPECTOMY  2002   [facility]  . COLONOSCOPY W/ POLYPECTOMY  2004; 2007 negative  . CORONARY ANGIOPLASTY  12/26/2013  . CORONARY ARTERY BYPASS GRAFT N/A 08/01/2018   Procedure: CORONARY ARTERY BYPASS GRAFTING (CABG) TIMES THREE USING LEFT INTERNAL MAMMARY ARTERY TO LAD, RIGHT GREATER SAPHENOUS VEIN GRAFT TO OM AND RCA, VEIN HARVESTED ENDOSCOPICALLY;  Surgeon: Grace Isaac, MD;  Location: [facility];  Service: Open Heart Surgery;  Laterality: N/A;  . CORONARY STENT PLACEMENT  2007, 2011  . infantile paralysis facial asymmetry    . LEFT HEART CATH AND CORONARY ANGIOGRAPHY N/A 05/18/2017   Procedure: Left Heart Cath and Coronary Angiography;  Surgeon: Martinique, Peter M, MD;  Location: [facility] CV LAB;  Service: Cardiovascular;  Laterality: N/A;  . LEFT HEART CATH AND CORONARY ANGIOGRAPHY N/A 07/27/2018   Procedure: LEFT HEART CATH AND CORONARY ANGIOGRAPHY;  Surgeon: Martinique, Peter M, MD;  Location: [facility] CV LAB;  Service: Cardiovascular;  Laterality: N/A;  . LEFT HEART CATHETERIZATION WITH CORONARY ANGIOGRAM N/A 10/02/2012   Procedure: LEFT HEART CATHETERIZATION WITH CORONARY ANGIOGRAM;  Surgeon: Peter M Martinique, MD;  Location: [facility] CATH LAB;  Service: Cardiovascular;  Laterality: N/A;  . LEFT HEART CATHETERIZATION WITH CORONARY ANGIOGRAM N/A 02/20/2013   Procedure: LEFT HEART  CATHETERIZATION WITH CORONARY ANGIOGRAM;  Surgeon: Burnell Blanks, MD;  Location: [facility] CATH LAB;  Service: Cardiovascular;  Laterality: N/A;  . LEFT HEART CATHETERIZATION WITH CORONARY ANGIOGRAM N/A 12/26/2013   Procedure: LEFT HEART CATHETERIZATION WITH CORONARY ANGIOGRAM;  Surgeon: Burnell Blanks, MD;  Location: [facility] CATH LAB;  Service: Cardiovascular;  Laterality: N/A;  . LEFT HEART CATHETERIZATION WITH CORONARY ANGIOGRAM N/A 11/28/2014   Procedure: LEFT HEART CATHETERIZATION WITH CORONARY ANGIOGRAM;  Surgeon: Burnell Blanks, MD;  Location: [facility] CATH LAB;  Service: Cardiovascular;  Laterality: N/A;  . PERCUTANEOUS CORONARY STENT INTERVENTION (PCI-S)  10/02/2012   Procedure: PERCUTANEOUS CORONARY STENT INTERVENTION (PCI-S);  Surgeon: Peter M Martinique, MD;  Location: [facility] CATH LAB;  Service: Cardiovascular;;  . PERCUTANEOUS CORONARY STENT INTERVENTION (PCI-S)  02/20/2013   Procedure: PERCUTANEOUS CORONARY STENT INTERVENTION (PCI-S);  Surgeon: Burnell Blanks, MD;  Location: [facility] CATH LAB;  Service: Cardiovascular;;  . PERCUTANEOUS CORONARY STENT INTERVENTION (PCI-S)  12/26/2013   Procedure: PERCUTANEOUS CORONARY STENT INTERVENTION (PCI-S);  Surgeon: Burnell Blanks, MD;  Location: [facility] CATH LAB;  Service: Cardiovascular;;  . TEE WITHOUT CARDIOVERSION N/A 08/01/2018   Procedure: TRANSESOPHAGEAL ECHOCARDIOGRAM (TEE);  Surgeon: Grace Isaac, MD;  Location: [facility];  Service: Open Heart Surgery;  Laterality: N/A;  . WISDOM TOOTH EXTRACTION      REVIEW OF SYSTEMS:  Constitutional: positive for fatigue Eyes: negative Ears, nose, mouth, throat, and face: negative Respiratory: positive for dyspnea on exertion Cardiovascular: negative Gastrointestinal: negative Genitourinary:negative Integument/breast: negative Hematologic/lymphatic: negative Musculoskeletal:negative Neurological: negative Behavioral/Psych: negative Endocrine: negative Allergic/Immunologic: negative    PHYSICAL EXAMINATION: General appearance: alert, cooperative, fatigued and no distress Head: Normocephalic, without obvious abnormality, atraumatic Neck: no adenopathy, no JVD, supple, symmetrical, trachea midline and thyroid not enlarged, symmetric, no tenderness/mass/nodules Lymph nodes: Cervical, supraclavicular, and axillary nodes normal. Resp: clear to auscultation bilaterally Back: symmetric, no curvature. ROM normal. No CVA tenderness. Cardio: regular rate and rhythm, S1, S2 normal, no murmur, click, rub or gallop GI: soft, non-tender; bowel sounds normal; no masses,  no organomegaly Extremities: extremities normal, atraumatic, no cyanosis or edema Neurologic: Alert and oriented X 3, normal strength  and tone. Normal symmetric reflexes. Normal coordination and gait  ECOG PERFORMANCE STATUS: 1 - Symptomatic but completely ambulatory  Blood pressure (!) 107/51, pulse 71, temperature 98.4 F (36.9 C), temperature source Oral, resp. rate 18, height 5\' 11"  (1.803 m), weight 178 lb 4.8 oz (80.9 kg), SpO2 99 %.  LABORATORY DATA: Lab Results  Component Value Date   WBC 9.9 10/31/2018   HGB 8.6 (L) 10/31/2018   HCT 28.0 (L) 10/31/2018   MCV 97.2 10/31/2018   PLT 198 10/31/2018      Chemistry      Component Value Date/Time   NA 140 10/17/2018 1217   NA 126 (L) 08/24/2018 1237   K 4.0 10/17/2018 1217   CL 104 10/17/2018 1217   CO2 25 10/17/2018 1217   BUN 20 10/17/2018 1217   BUN 19 08/24/2018 1237   CREATININE 1.36 (H) 10/17/2018 1217   CREATININE 1.67 (H) 10/30/2016 1033      Component Value Date/Time   CALCIUM 9.0 10/17/2018 1217   ALKPHOS 95 10/17/2018 1217   AST 19 10/17/2018 1217   ALT 23 10/17/2018 1217   BILITOT 0.5 10/17/2018 1217       RADIOGRAPHIC STUDIES: Ct Chest W Contrast  Result Date: 10/27/2018 CLINICAL DATA:  Cancer with ongoing chemotherapy. EXAM: CT CHEST, ABDOMEN, AND PELVIS WITH CONTRAST TECHNIQUE: Multidetector CT imaging of the chest,  abdomen and pelvis was performed following the standard protocol during bolus administration of intravenous contrast. CONTRAST:  122mL OMNIPAQUE IOHEXOL 300 MG/ML  SOLN COMPARISON:  PET 09/09/2018, CT chest 08/13/2018 and CT abdomen pelvis 10/16/2017. FINDINGS: CT CHEST FINDINGS Cardiovascular: Atherosclerotic calcification of the aorta. Heart is enlarged. No pericardial effusion. Mediastinum/Nodes: Continued improvement in mediastinal adenopathy. Residual low right paratracheal nodal lesion is now somewhat ill-defined, measuring approximately 1.6 x 2.1 cm, compared to 2.5 x 2.9 cm on 09/09/2018. No hilar or axillary adenopathy. Esophagus is grossly unremarkable. Lungs/Pleura: Areas of volume loss and amorphous ground-glass are again seen in the right upper lobe. Moderate right pleural effusion, slightly decreased. Left pleural effusion is new 09/09/2018. Centrilobular and paraseptal emphysema. Mild volume loss in the lingula. Adherent debris in the distal bronchus intermedius. Airway is otherwise unremarkable. Musculoskeletal: Degenerative changes in the spine. No worrisome lytic or sclerotic lesions. Probable scattered spinal hemangiomas. CT ABDOMEN PELVIS FINDINGS Hepatobiliary: Liver and gallbladder are unremarkable. No biliary ductal dilatation. Pancreas: Negative. Spleen: Negative. Adrenals/Urinary Tract: Adrenal glands are unremarkable. Previously seen hypermetabolic lesion off the anterior lower pole right kidney has nearly completely resolved with only a small area of residual soft tissue thickening (series 2, image 76). 3.2 cm low-attenuation lesion in the interpolar right kidney is likely a cyst. Subcentimeter exophytic low-attenuation lesion off the interpolar left kidney is too small to characterize. Ureters are decompressed. Bladder is grossly unremarkable. Stomach/Bowel: Small hiatal hernia. Stomach is decompressed. Small bowel, appendix and colon are unremarkable. Vascular/Lymphatic: Atherosclerotic  calcification of the aorta. Infrarenal aorta measures up to 3.5 cm (sagittal series 6, image 106). Portacaval lymph node measures 9 mm (series 2, image 63), previously 1.3 cm. No pathologically enlarged lymph nodes. Reproductive: Prostate is visualized. Other: Small bilateral inguinal hernias contain fat. No free fluid. Mesenteries and peritoneum are unremarkable. Musculoskeletal: Degenerative changes in the spine. No lytic or sclerotic lesions. Slight retrolisthesis of L5 on S1. IMPRESSION: 1. Continue response to therapy as evidenced by decreasing mediastinal adenopathy, smaller portacaval lymph node and near complete resolution of a right perinephric metastasis. Hypermetabolic osseous metastatic disease seen on 09/09/2018 remains occult  on CT imaging. 2. Areas of ill-defined nodularity/ground-glass in the right upper lobe, similar to 09/09/2018. Continued attention on follow-up exams is warranted. 3. Moderate right pleural effusion, decreased. New small left pleural effusion. 4. Infrarenal aortic aneurysm. Recommend followup by ultrasound in 2 years. This recommendation follows ACR consensus guidelines: White Paper of the ACR Incidental Findings Committee II on Vascular Findings. J Am Coll Radiol 2013; 10:789-794. 5.  Aortic atherosclerosis (ICD10-170.0). 6.  Emphysema (ICD10-J43.9). Electronically Signed   By: Lorin Picket M.D.   On: 10/27/2018 13:34   Ct Abdomen Pelvis W Contrast  Result Date: 10/27/2018 CLINICAL DATA:  Cancer with ongoing chemotherapy. EXAM: CT CHEST, ABDOMEN, AND PELVIS WITH CONTRAST TECHNIQUE: Multidetector CT imaging of the chest, abdomen and pelvis was performed following the standard protocol during bolus administration of intravenous contrast. CONTRAST:  133mL OMNIPAQUE IOHEXOL 300 MG/ML  SOLN COMPARISON:  PET 09/09/2018, CT chest 08/13/2018 and CT abdomen pelvis 10/16/2017. FINDINGS: CT CHEST FINDINGS Cardiovascular: Atherosclerotic calcification of the aorta. Heart is enlarged.  No pericardial effusion. Mediastinum/Nodes: Continued improvement in mediastinal adenopathy. Residual low right paratracheal nodal lesion is now somewhat ill-defined, measuring approximately 1.6 x 2.1 cm, compared to 2.5 x 2.9 cm on 09/09/2018. No hilar or axillary adenopathy. Esophagus is grossly unremarkable. Lungs/Pleura: Areas of volume loss and amorphous ground-glass are again seen in the right upper lobe. Moderate right pleural effusion, slightly decreased. Left pleural effusion is new 09/09/2018. Centrilobular and paraseptal emphysema. Mild volume loss in the lingula. Adherent debris in the distal bronchus intermedius. Airway is otherwise unremarkable. Musculoskeletal: Degenerative changes in the spine. No worrisome lytic or sclerotic lesions. Probable scattered spinal hemangiomas. CT ABDOMEN PELVIS FINDINGS Hepatobiliary: Liver and gallbladder are unremarkable. No biliary ductal dilatation. Pancreas: Negative. Spleen: Negative. Adrenals/Urinary Tract: Adrenal glands are unremarkable. Previously seen hypermetabolic lesion off the anterior lower pole right kidney has nearly completely resolved with only a small area of residual soft tissue thickening (series 2, image 76). 3.2 cm low-attenuation lesion in the interpolar right kidney is likely a cyst. Subcentimeter exophytic low-attenuation lesion off the interpolar left kidney is too small to characterize. Ureters are decompressed. Bladder is grossly unremarkable. Stomach/Bowel: Small hiatal hernia. Stomach is decompressed. Small bowel, appendix and colon are unremarkable. Vascular/Lymphatic: Atherosclerotic calcification of the aorta. Infrarenal aorta measures up to 3.5 cm (sagittal series 6, image 106). Portacaval lymph node measures 9 mm (series 2, image 63), previously 1.3 cm. No pathologically enlarged lymph nodes. Reproductive: Prostate is visualized. Other: Small bilateral inguinal hernias contain fat. No free fluid. Mesenteries and peritoneum are  unremarkable. Musculoskeletal: Degenerative changes in the spine. No lytic or sclerotic lesions. Slight retrolisthesis of L5 on S1. IMPRESSION: 1. Continue response to therapy as evidenced by decreasing mediastinal adenopathy, smaller portacaval lymph node and near complete resolution of a right perinephric metastasis. Hypermetabolic osseous metastatic disease seen on 09/09/2018 remains occult on CT imaging. 2. Areas of ill-defined nodularity/ground-glass in the right upper lobe, similar to 09/09/2018. Continued attention on follow-up exams is warranted. 3. Moderate right pleural effusion, decreased. New small left pleural effusion. 4. Infrarenal aortic aneurysm. Recommend followup by ultrasound in 2 years. This recommendation follows ACR consensus guidelines: White Paper of the ACR Incidental Findings Committee II on Vascular Findings. J Am Coll Radiol 2013; 10:789-794. 5.  Aortic atherosclerosis (ICD10-170.0). 6.  Emphysema (ICD10-J43.9). Electronically Signed   By: Lorin Picket M.D.   On: 10/27/2018 13:34    ASSESSMENT AND PLAN: This is a very pleasant 77 years old white male recently diagnosed  with extensive stage small cell lung cancer.  The patient is here today for evaluation before starting the first cycle of his systemic chemotherapy with carboplatin, etoposide and Tecentriq.  Status post 3 cycles. He has been tolerating this treatment well with no concerning adverse effects. The patient had repeat CT scan of the chest, abdomen and pelvis performed recently.  I personally and independently reviewed the scan images and discussed the result and showed the images to the patient and his wife.  His a scan showed significant improvement of his disease. I recommended for the patient to proceed with cycle #4 today. For the shortness of breath, he will continue with his home oxygen for now. We will see the patient back for follow-up visit in 3 weeks for evaluation before starting cycle #5. The patient  was advised to call immediately if he has any concerning symptoms in the interval. The patient voices understanding of current disease status and treatment options and is in agreement with the current care plan. All questions were answered. The patient knows to call the clinic with any problems, questions or concerns. We can certainly see the patient much sooner if necessary.  I spent 15 minutes counseling the patient face to face. The total time spent in the appointment was 25 minutes.  Disclaimer: This note was dictated with voice recognition software. Similar sounding words can inadvertently be transcribed and may not be corrected upon review.

## 2018-10-31 NOTE — Patient Instructions (Addendum)
[facility] Discharge Instructions for Patients Receiving Chemotherapy  Today you received the following chemotherapy agents: Atezolizumab (Tecentriq), Carboplatin (Paraplatin), Etoposide (Vepesid, VP-16)  To help prevent nausea and vomiting after your treatment, we encourage you to take your nausea medication as directed. Received Aloxi during treatment today-->Take Compazine (not Zofran) for the next 3 days as needed.    If you develop nausea and vomiting that is not controlled by your nausea medication, call the clinic.   BELOW ARE SYMPTOMS THAT SHOULD BE REPORTED IMMEDIATELY:  *FEVER GREATER THAN 100.5 F  *CHILLS WITH OR WITHOUT FEVER  NAUSEA AND VOMITING THAT IS NOT CONTROLLED WITH YOUR NAUSEA MEDICATION  *UNUSUAL SHORTNESS OF BREATH  *UNUSUAL BRUISING OR BLEEDING  TENDERNESS IN MOUTH AND THROAT WITH OR WITHOUT PRESENCE OF ULCERS  *URINARY PROBLEMS  *BOWEL PROBLEMS  UNUSUAL RASH Items with * indicate a potential emergency and should be followed up as soon as possible.  Feel free to call the clinic should you have any questions or concerns. The clinic phone number is [phone].  Please show the [place] at check-in to the Emergency Department and triage nurse.

## 2018-11-01 ENCOUNTER — Telehealth: Payer: Self-pay | Admitting: Internal Medicine

## 2018-11-01 ENCOUNTER — Inpatient Hospital Stay: Payer: BLUE CROSS/BLUE SHIELD

## 2018-11-01 VITALS — BP 108/46 | HR 66 | Temp 98.0°F | Resp 18

## 2018-11-01 DIAGNOSIS — C7989 Secondary malignant neoplasm of other specified sites: Secondary | ICD-10-CM | POA: Diagnosis not present

## 2018-11-01 DIAGNOSIS — C3411 Malignant neoplasm of upper lobe, right bronchus or lung: Secondary | ICD-10-CM | POA: Diagnosis not present

## 2018-11-01 DIAGNOSIS — Z5111 Encounter for antineoplastic chemotherapy: Secondary | ICD-10-CM | POA: Diagnosis not present

## 2018-11-01 DIAGNOSIS — Z7689 Persons encountering health services in other specified circumstances: Secondary | ICD-10-CM | POA: Diagnosis not present

## 2018-11-01 DIAGNOSIS — I4891 Unspecified atrial fibrillation: Secondary | ICD-10-CM | POA: Diagnosis not present

## 2018-11-01 DIAGNOSIS — C3490 Malignant neoplasm of unspecified part of unspecified bronchus or lung: Secondary | ICD-10-CM

## 2018-11-01 DIAGNOSIS — J90 Pleural effusion, not elsewhere classified: Secondary | ICD-10-CM | POA: Diagnosis not present

## 2018-11-01 DIAGNOSIS — D649 Anemia, unspecified: Secondary | ICD-10-CM | POA: Diagnosis not present

## 2018-11-01 DIAGNOSIS — Z5112 Encounter for antineoplastic immunotherapy: Secondary | ICD-10-CM | POA: Diagnosis not present

## 2018-11-01 DIAGNOSIS — C771 Secondary and unspecified malignant neoplasm of intrathoracic lymph nodes: Secondary | ICD-10-CM | POA: Diagnosis not present

## 2018-11-01 MED ORDER — DEXAMETHASONE SODIUM PHOSPHATE 10 MG/ML IJ SOLN
10.0000 mg | Freq: Once | INTRAMUSCULAR | Status: AC
  Administered 2018-11-01: 10 mg via INTRAVENOUS

## 2018-11-01 MED ORDER — SODIUM CHLORIDE 0.9 % IV SOLN
95.0000 mg/m2 | Freq: Once | INTRAVENOUS | Status: AC
  Administered 2018-11-01: 200 mg via INTRAVENOUS
  Filled 2018-11-01: qty 10

## 2018-11-01 MED ORDER — SODIUM CHLORIDE 0.9 % IV SOLN
Freq: Once | INTRAVENOUS | Status: AC
  Administered 2018-11-01: 13:00:00 via INTRAVENOUS
  Filled 2018-11-01: qty 250

## 2018-11-01 MED ORDER — DEXAMETHASONE SODIUM PHOSPHATE 10 MG/ML IJ SOLN
INTRAMUSCULAR | Status: AC
  Filled 2018-11-01: qty 1

## 2018-11-01 NOTE — Telephone Encounter (Signed)
Scheduled appt per 12/30 los - pt to get an updated schedule next visit.

## 2018-11-01 NOTE — Patient Instructions (Signed)
[facility] Discharge Instructions for Patients Receiving Chemotherapy  Today you received the following chemotherapy agents Etoposide.  To help prevent nausea and vomiting after your treatment, we encourage you to take your nausea medication as directed   If you develop nausea and vomiting that is not controlled by your nausea medication, call the clinic.   BELOW ARE SYMPTOMS THAT SHOULD BE REPORTED IMMEDIATELY:  *FEVER GREATER THAN 100.5 F  *CHILLS WITH OR WITHOUT FEVER  NAUSEA AND VOMITING THAT IS NOT CONTROLLED WITH YOUR NAUSEA MEDICATION  *UNUSUAL SHORTNESS OF BREATH  *UNUSUAL BRUISING OR BLEEDING  TENDERNESS IN MOUTH AND THROAT WITH OR WITHOUT PRESENCE OF ULCERS  *URINARY PROBLEMS  *BOWEL PROBLEMS  UNUSUAL RASH Items with * indicate a potential emergency and should be followed up as soon as possible.  Feel free to call the clinic should you have any questions or concerns. The clinic phone number is [phone].  Please show the [place] at check-in to the Emergency Department and triage nurse.

## 2018-11-03 ENCOUNTER — Telehealth: Payer: Self-pay | Admitting: Internal Medicine

## 2018-11-03 ENCOUNTER — Inpatient Hospital Stay: Payer: Medicare HMO | Attending: Internal Medicine

## 2018-11-03 VITALS — BP 103/53 | HR 62 | Temp 98.2°F | Resp 18

## 2018-11-03 DIAGNOSIS — I255 Ischemic cardiomyopathy: Secondary | ICD-10-CM | POA: Diagnosis not present

## 2018-11-03 DIAGNOSIS — I110 Hypertensive heart disease with heart failure: Secondary | ICD-10-CM | POA: Insufficient documentation

## 2018-11-03 DIAGNOSIS — N289 Disorder of kidney and ureter, unspecified: Secondary | ICD-10-CM | POA: Diagnosis not present

## 2018-11-03 DIAGNOSIS — C3411 Malignant neoplasm of upper lobe, right bronchus or lung: Secondary | ICD-10-CM | POA: Insufficient documentation

## 2018-11-03 DIAGNOSIS — E119 Type 2 diabetes mellitus without complications: Secondary | ICD-10-CM | POA: Diagnosis not present

## 2018-11-03 DIAGNOSIS — K4020 Bilateral inguinal hernia, without obstruction or gangrene, not specified as recurrent: Secondary | ICD-10-CM | POA: Diagnosis not present

## 2018-11-03 DIAGNOSIS — I2510 Atherosclerotic heart disease of native coronary artery without angina pectoris: Secondary | ICD-10-CM | POA: Diagnosis not present

## 2018-11-03 DIAGNOSIS — I252 Old myocardial infarction: Secondary | ICD-10-CM | POA: Insufficient documentation

## 2018-11-03 DIAGNOSIS — Z79899 Other long term (current) drug therapy: Secondary | ICD-10-CM | POA: Insufficient documentation

## 2018-11-03 DIAGNOSIS — C3490 Malignant neoplasm of unspecified part of unspecified bronchus or lung: Secondary | ICD-10-CM

## 2018-11-03 DIAGNOSIS — J439 Emphysema, unspecified: Secondary | ICD-10-CM | POA: Diagnosis not present

## 2018-11-03 DIAGNOSIS — Z7982 Long term (current) use of aspirin: Secondary | ICD-10-CM | POA: Insufficient documentation

## 2018-11-03 DIAGNOSIS — I700 Atherosclerosis of aorta: Secondary | ICD-10-CM | POA: Insufficient documentation

## 2018-11-03 DIAGNOSIS — K449 Diaphragmatic hernia without obstruction or gangrene: Secondary | ICD-10-CM | POA: Diagnosis not present

## 2018-11-03 DIAGNOSIS — J90 Pleural effusion, not elsewhere classified: Secondary | ICD-10-CM | POA: Diagnosis not present

## 2018-11-03 DIAGNOSIS — R590 Localized enlarged lymph nodes: Secondary | ICD-10-CM | POA: Insufficient documentation

## 2018-11-03 DIAGNOSIS — I4891 Unspecified atrial fibrillation: Secondary | ICD-10-CM | POA: Diagnosis not present

## 2018-11-03 DIAGNOSIS — Z7689 Persons encountering health services in other specified circumstances: Secondary | ICD-10-CM | POA: Diagnosis not present

## 2018-11-03 DIAGNOSIS — C7989 Secondary malignant neoplasm of other specified sites: Secondary | ICD-10-CM | POA: Insufficient documentation

## 2018-11-03 DIAGNOSIS — E785 Hyperlipidemia, unspecified: Secondary | ICD-10-CM | POA: Diagnosis not present

## 2018-11-03 DIAGNOSIS — Z5112 Encounter for antineoplastic immunotherapy: Secondary | ICD-10-CM | POA: Insufficient documentation

## 2018-11-03 DIAGNOSIS — Z5111 Encounter for antineoplastic chemotherapy: Secondary | ICD-10-CM | POA: Insufficient documentation

## 2018-11-03 DIAGNOSIS — I480 Paroxysmal atrial fibrillation: Secondary | ICD-10-CM | POA: Insufficient documentation

## 2018-11-03 MED ORDER — SODIUM CHLORIDE 0.9 % IV SOLN
95.0000 mg/m2 | Freq: Once | INTRAVENOUS | Status: AC
  Administered 2018-11-03: 200 mg via INTRAVENOUS
  Filled 2018-11-03: qty 10

## 2018-11-03 MED ORDER — DEXAMETHASONE SODIUM PHOSPHATE 10 MG/ML IJ SOLN
INTRAMUSCULAR | Status: AC
  Filled 2018-11-03: qty 1

## 2018-11-03 MED ORDER — SODIUM CHLORIDE 0.9 % IV SOLN
Freq: Once | INTRAVENOUS | Status: AC
  Administered 2018-11-03: 14:00:00 via INTRAVENOUS
  Filled 2018-11-03: qty 250

## 2018-11-03 MED ORDER — DEXAMETHASONE SODIUM PHOSPHATE 10 MG/ML IJ SOLN
10.0000 mg | Freq: Once | INTRAMUSCULAR | Status: AC
  Administered 2018-11-03: 10 mg via INTRAVENOUS

## 2018-11-03 NOTE — Telephone Encounter (Signed)
Left message re 1/4 appointment.

## 2018-11-04 ENCOUNTER — Inpatient Hospital Stay: Payer: Medicare HMO

## 2018-11-05 ENCOUNTER — Inpatient Hospital Stay: Payer: Medicare HMO

## 2018-11-05 VITALS — BP 110/50 | HR 71 | Temp 97.5°F | Resp 18

## 2018-11-05 DIAGNOSIS — N289 Disorder of kidney and ureter, unspecified: Secondary | ICD-10-CM | POA: Diagnosis not present

## 2018-11-05 DIAGNOSIS — Z5111 Encounter for antineoplastic chemotherapy: Secondary | ICD-10-CM | POA: Diagnosis not present

## 2018-11-05 DIAGNOSIS — C3411 Malignant neoplasm of upper lobe, right bronchus or lung: Secondary | ICD-10-CM | POA: Diagnosis not present

## 2018-11-05 DIAGNOSIS — C3490 Malignant neoplasm of unspecified part of unspecified bronchus or lung: Secondary | ICD-10-CM

## 2018-11-05 DIAGNOSIS — Z5112 Encounter for antineoplastic immunotherapy: Secondary | ICD-10-CM | POA: Diagnosis not present

## 2018-11-05 DIAGNOSIS — Z7689 Persons encountering health services in other specified circumstances: Secondary | ICD-10-CM | POA: Diagnosis not present

## 2018-11-05 DIAGNOSIS — C7989 Secondary malignant neoplasm of other specified sites: Secondary | ICD-10-CM | POA: Diagnosis not present

## 2018-11-05 DIAGNOSIS — I2510 Atherosclerotic heart disease of native coronary artery without angina pectoris: Secondary | ICD-10-CM | POA: Diagnosis not present

## 2018-11-05 DIAGNOSIS — I4891 Unspecified atrial fibrillation: Secondary | ICD-10-CM | POA: Diagnosis not present

## 2018-11-05 DIAGNOSIS — J439 Emphysema, unspecified: Secondary | ICD-10-CM | POA: Diagnosis not present

## 2018-11-05 MED ORDER — PEGFILGRASTIM-CBQV 6 MG/0.6ML ~~LOC~~ SOSY
PREFILLED_SYRINGE | SUBCUTANEOUS | Status: AC
  Filled 2018-11-05: qty 0.6

## 2018-11-05 MED ORDER — PEGFILGRASTIM-CBQV 6 MG/0.6ML ~~LOC~~ SOSY
6.0000 mg | PREFILLED_SYRINGE | Freq: Once | SUBCUTANEOUS | Status: AC
  Administered 2018-11-05: 6 mg via SUBCUTANEOUS

## 2018-11-05 NOTE — Patient Instructions (Signed)
Pegfilgrastim injection  What is this medicine?  PEGFILGRASTIM (PEG fil gra stim) is a long-acting granulocyte colony-stimulating factor that stimulates the growth of neutrophils, a type of white blood cell important in the body's fight against infection. It is used to reduce the incidence of fever and infection in patients with certain types of cancer who are receiving chemotherapy that affects the bone marrow, and to increase survival after being exposed to high doses of radiation.  This medicine may be used for other purposes; ask your health care provider or pharmacist if you have questions.  COMMON BRAND NAME(S): Fulphila, Neulasta, UDENYCA  What should I tell my health care provider before I take this medicine?  They need to know if you have any of these conditions:  -kidney disease  -latex allergy  -ongoing radiation therapy  -sickle cell disease  -skin reactions to acrylic adhesives (On-Body Injector only)  -an unusual or allergic reaction to pegfilgrastim, filgrastim, other medicines, foods, dyes, or preservatives  -pregnant or trying to get pregnant  -breast-feeding  How should I use this medicine?  This medicine is for injection under the skin. If you get this medicine at home, you will be taught how to prepare and give the pre-filled syringe or how to use the On-body Injector. Refer to the patient Instructions for Use for detailed instructions. Use exactly as directed. Tell your healthcare provider immediately if you suspect that the On-body Injector may not have performed as intended or if you suspect the use of the On-body Injector resulted in a missed or partial dose.  It is important that you put your used needles and syringes in a special sharps container. Do not put them in a trash can. If you do not have a sharps container, call your pharmacist or healthcare provider to get one.  Talk to your pediatrician regarding the use of this medicine in children. While this drug may be prescribed for  selected conditions, precautions do apply.  Overdosage: If you think you have taken too much of this medicine contact a poison control center or emergency room at once.  NOTE: This medicine is only for you. Do not share this medicine with others.  What if I miss a dose?  It is important not to miss your dose. Call your doctor or health care professional if you miss your dose. If you miss a dose due to an On-body Injector failure or leakage, a new dose should be administered as soon as possible using a single prefilled syringe for manual use.  What may interact with this medicine?  Interactions have not been studied.  Give your health care provider a list of all the medicines, herbs, non-prescription drugs, or dietary supplements you use. Also tell them if you smoke, drink alcohol, or use illegal drugs. Some items may interact with your medicine.  This list may not describe all possible interactions. Give your health care provider a list of all the medicines, herbs, non-prescription drugs, or dietary supplements you use. Also tell them if you smoke, drink alcohol, or use illegal drugs. Some items may interact with your medicine.  What should I watch for while using this medicine?  You may need blood work done while you are taking this medicine.  If you are going to need a MRI, CT scan, or other procedure, tell your doctor that you are using this medicine (On-Body Injector only).  What side effects may I notice from receiving this medicine?  Side effects that you should report to   your doctor or health care professional as soon as possible:  -allergic reactions like skin rash, itching or hives, swelling of the face, lips, or tongue  -back pain  -dizziness  -fever  -pain, redness, or irritation at site where injected  -pinpoint red spots on the skin  -red or dark-brown urine  -shortness of breath or breathing problems  -stomach or side pain, or pain at the shoulder  -swelling  -tiredness  -trouble passing urine or  change in the amount of urine  Side effects that usually do not require medical attention (report to your doctor or health care professional if they continue or are bothersome):  -bone pain  -muscle pain  This list may not describe all possible side effects. Call your doctor for medical advice about side effects. You may report side effects to FDA at 1-800-FDA-1088.  Where should I keep my medicine?  Keep out of the reach of children.  If you are using this medicine at home, you will be instructed on how to store it. Throw away any unused medicine after the expiration date on the label.  NOTE: This sheet is a summary. It may not cover all possible information. If you have questions about this medicine, talk to your doctor, pharmacist, or health care provider.   2019 Elsevier/Gold Standard (2018-01-24 16:57:08)

## 2018-11-06 NOTE — Telephone Encounter (Signed)
Fraser Din, Were these refilled? Thanks, chris

## 2018-11-07 ENCOUNTER — Inpatient Hospital Stay: Payer: Medicare HMO

## 2018-11-07 ENCOUNTER — Encounter: Payer: Self-pay | Admitting: Emergency Medicine

## 2018-11-07 ENCOUNTER — Other Ambulatory Visit: Payer: Self-pay | Admitting: Medical Oncology

## 2018-11-07 ENCOUNTER — Ambulatory Visit (INDEPENDENT_AMBULATORY_CARE_PROVIDER_SITE_OTHER): Payer: Medicare HMO | Admitting: Emergency Medicine

## 2018-11-07 DIAGNOSIS — J439 Emphysema, unspecified: Secondary | ICD-10-CM | POA: Diagnosis not present

## 2018-11-07 DIAGNOSIS — C9000 Multiple myeloma not having achieved remission: Secondary | ICD-10-CM

## 2018-11-07 DIAGNOSIS — R5382 Chronic fatigue, unspecified: Secondary | ICD-10-CM

## 2018-11-07 DIAGNOSIS — J9611 Chronic respiratory failure with hypoxia: Secondary | ICD-10-CM

## 2018-11-07 DIAGNOSIS — J449 Chronic obstructive pulmonary disease, unspecified: Secondary | ICD-10-CM

## 2018-11-07 DIAGNOSIS — Z7689 Persons encountering health services in other specified circumstances: Secondary | ICD-10-CM | POA: Diagnosis not present

## 2018-11-07 DIAGNOSIS — Z5112 Encounter for antineoplastic immunotherapy: Secondary | ICD-10-CM | POA: Diagnosis not present

## 2018-11-07 DIAGNOSIS — Z5111 Encounter for antineoplastic chemotherapy: Secondary | ICD-10-CM | POA: Diagnosis not present

## 2018-11-07 DIAGNOSIS — I4891 Unspecified atrial fibrillation: Secondary | ICD-10-CM | POA: Diagnosis not present

## 2018-11-07 DIAGNOSIS — C3411 Malignant neoplasm of upper lobe, right bronchus or lung: Secondary | ICD-10-CM

## 2018-11-07 DIAGNOSIS — C3491 Malignant neoplasm of unspecified part of right bronchus or lung: Secondary | ICD-10-CM

## 2018-11-07 DIAGNOSIS — C7989 Secondary malignant neoplasm of other specified sites: Secondary | ICD-10-CM | POA: Diagnosis not present

## 2018-11-07 DIAGNOSIS — I2510 Atherosclerotic heart disease of native coronary artery without angina pectoris: Secondary | ICD-10-CM | POA: Diagnosis not present

## 2018-11-07 DIAGNOSIS — N289 Disorder of kidney and ureter, unspecified: Secondary | ICD-10-CM | POA: Diagnosis not present

## 2018-11-07 LAB — CBC WITH DIFFERENTIAL (CANCER CENTER ONLY)
Abs Immature Granulocytes: 0.3 10*3/uL — ABNORMAL HIGH (ref 0.00–0.07)
BAND NEUTROPHILS: 10 %
Basophils Absolute: 0 10*3/uL (ref 0.0–0.1)
Basophils Relative: 0 %
Eosinophils Absolute: 0 10*3/uL (ref 0.0–0.5)
Eosinophils Relative: 0 %
HCT: 24 % — ABNORMAL LOW (ref 39.0–52.0)
Hemoglobin: 7.5 g/dL — ABNORMAL LOW (ref 13.0–17.0)
LYMPHS PCT: 1 %
Lymphs Abs: 0.3 10*3/uL — ABNORMAL LOW (ref 0.7–4.0)
MCH: 30.9 pg (ref 26.0–34.0)
MCHC: 31.3 g/dL (ref 30.0–36.0)
MCV: 98.8 fL (ref 80.0–100.0)
Metamyelocytes Relative: 1 %
Monocytes Absolute: 0.3 10*3/uL (ref 0.1–1.0)
Monocytes Relative: 1 %
NRBC: 0 % (ref 0.0–0.2)
Neutro Abs: 32.6 10*3/uL — ABNORMAL HIGH (ref 1.7–17.7)
Neutrophils Relative %: 87 %
Platelet Count: 116 10*3/uL — ABNORMAL LOW (ref 150–400)
RBC: 2.43 MIL/uL — ABNORMAL LOW (ref 4.22–5.81)
RDW: 18.6 % — ABNORMAL HIGH (ref 11.5–15.5)
WBC Count: 33.6 10*3/uL — ABNORMAL HIGH (ref 4.0–10.5)

## 2018-11-07 LAB — CMP (CANCER CENTER ONLY)
ALT: 16 U/L (ref 0–44)
AST: 14 U/L — ABNORMAL LOW (ref 15–41)
Albumin: 3.4 g/dL — ABNORMAL LOW (ref 3.5–5.0)
Alkaline Phosphatase: 103 U/L (ref 38–126)
Anion gap: 8 (ref 5–15)
BUN: 25 mg/dL — ABNORMAL HIGH (ref 8–23)
CO2: 28 mmol/L (ref 22–32)
Calcium: 8.5 mg/dL — ABNORMAL LOW (ref 8.9–10.3)
Chloride: 105 mmol/L (ref 98–111)
Creatinine: 1.34 mg/dL — ABNORMAL HIGH (ref 0.61–1.24)
GFR, EST AFRICAN AMERICAN: 58 mL/min — AB (ref >60)
GFR, Estimated: 50 mL/min — ABNORMAL LOW (ref >60)
Glucose, Bld: 201 mg/dL — ABNORMAL HIGH (ref 70–99)
Potassium: 4.1 mmol/L (ref 3.5–5.1)
SODIUM: 141 mmol/L (ref 135–145)
Total Bilirubin: 0.4 mg/dL (ref 0.3–1.2)
Total Protein: 5.8 g/dL — ABNORMAL LOW (ref 6.5–8.1)

## 2018-11-07 LAB — TSH: TSH: 0.239 u[IU]/mL — ABNORMAL LOW (ref 0.320–4.118)

## 2018-11-07 NOTE — Telephone Encounter (Signed)
Prescriptions have been sent to pharmacy

## 2018-11-07 NOTE — Progress Notes (Signed)
Subjective:    Patient ID: Gregory Bates, male    DOB: 1940-12-14, 78 y.o.   MRN: [id number]  HPI 78 year old former smoker (60 pack years) with history of coronary disease, atrial fibrillation on amiodarone, hypertension, combined systolic and diastolic CHF, diabetes, allergic rhinitis.  He has a new diagnosis of extensive stage small cell lung cancer that was diagnosed with evaluation of pulmonary nodules, mediastinal lymphadenopathy and pericardial metastatic disease, followed by Dr. Servando Snare and Julien Nordmann.  The dx was made on Bx when he was undergoing CABG in early October. Required a R thora on 10/13, repeat CXR 11/14 without much reaccumulation.  He is currently on palliative systemic chemotherapy has completed 2 rounds and appears to be tolerating. He has fairly long-standing exertional SOB. He has been prescribed O2 w exertion 2L/min on d/c from that hospitalization. The breathing may be a bit better since the hospitalization (? Due to the CABG, the O2, ? Both).  He has daily cough, usually non-productive, not necessarily at any certain time. He feels some head congestion, little PND. He was started on albuterol to use prn. He isn't sure it is doing much. His activity level is low. He is interested in a POC, assuming he still needs O2.   Spirometry done 07/28/18 reviewed by me >> obstruction, probably mixed restriction. Severely decreased FEV1, 1.55 L, 50% predicted.   ROV 11/07/18 --78 year old gentleman follows up today for his history of tobacco use, extensive stage small cell lung cancer.  He also has a history of coronary disease, systolic and diastolic CHF, diabetes, allergic rhinitis.  He has exertional hypoxemia.  Based on his obstruction on pulmonary function testing we started him on Stiolto 1 month ago.  He reports that he took it reliably, didn't notice any change in his breathing. Last Ct chest 12/26 >> improvement, decreased effusion, decreased hilar LAD. He is wearing O2 reliably w  exertion.  He wants a portable oxygen concentrator but last time he was here he did not desaturate with a short walk so I could not qualify him.   Review of Systems  Constitutional: Positive for unexpected weight change. Negative for fever.  HENT: Positive for ear pain. Negative for congestion, dental problem, nosebleeds, postnasal drip, rhinorrhea, sinus pressure, sneezing, sore throat and trouble swallowing.   Eyes: Negative for redness and itching.  Respiratory: Positive for cough and shortness of breath. Negative for chest tightness and wheezing.   Cardiovascular: Negative for palpitations and leg swelling.  Gastrointestinal: Negative for nausea and vomiting.  Genitourinary: Negative for dysuria.  Musculoskeletal: Negative for joint swelling.  Skin: Negative for rash.  Neurological: Negative for headaches.  Hematological: Does not bruise/bleed easily.  Psychiatric/Behavioral: Negative for dysphoric mood. The patient is not nervous/anxious.     Past Medical History:  Diagnosis Date  . Acute renal insufficiency    a. During 11/2013 adm with atrial flutter.  . Anemia, unspecified   . Atrial fibrillation ([place])   . CAD (coronary artery disease), native coronary artery    a. s/p cath May 2011 >> DES distal RCA;  b. 01/2013 NSTEMI >> OM1 99p (2.75x12 Promus Premier DES);  c. LHC (2/15): PCI: Promus DES to dist RCA ;  d. LHC 1/16 - dLM 58, oLAD 30, small D1 tandem 30, pOM1 stent ok, mOM 50-60, dOM bifurcation 50-60 in both vessels, AV 60-70 jailed by stent, pRCA 73 mRCA stent ok, dRCA stent ok, PLB smal 50, dPDA 60, 80; EF 35-40 >> med Rx - PCI of PDA  if angina  . Chronic combined systolic and diastolic CHF (congestive heart failure) ([place]) 12/23/2015   Echo 2/17: Mild LVH, EF 45-50%, inferior akinesis, grade 2 diastolic dysfunction, mild AI, mild MR, mild LAE, PASP 44 mmHg   . Colorectal polyps 2002  . Diabetes mellitus    AODM  . Erectile dysfunction   . History of echocardiogram    a.  01/2013 Echo: EF 55%, mid-dist inflat HK, Gr1 DD, Triv AI/TR, Mild MR.;  b.  Echo (1/15): Mild LVH, EF 55%, inferolateral hypokinesis, grade 1 diastolic dysfunction, mild AI, trivial MR, moderate LAE, PASP 36 mmHg   . Hx of cardiovascular stress test    Stress myoview 08/07/13 with LVEF 44%, inferior scar, no ischemia  . Hyperlipidemia   . Hypertension   . Hyponatremia   . Ischemic cardiomyopathy    a. EF 35-40% by LHC in 1/16; b. Echo 2/17: Mild LVH, EF 45-50%, inferior akinesis, grade 2 diastolic dysfunction, mild AI, mild MR, mild LAE, PASP 44 mmHg  . Myocardial infarction ([place])   . Oxygen deficiency   . PAF (paroxysmal atrial fibrillation) ([place])    a. Dx 11/2013. Spont conv. Placed on eliquis.  . Small cell lung cancer in adult [facility] 07/28/2018     Family History  Problem Relation Age of Onset  . Heart attack Brother 8  . Colon cancer Brother   . Prostate cancer Brother   . Breast cancer Sister   . Stroke Sister   . Diabetes Neg Hx   . Hypertension Neg Hx      Social History   Socioeconomic History  . Marital status: Married    Spouse name: Not on file  . Number of children: 1  . Years of education: Not on file  . Highest education level: Not on file  Occupational History  . Occupation: maintenance    Comment: full time  Social Needs  . Financial resource strain: Not hard at all  . Food insecurity:    Worry: Never true    Inability: Never true  . Transportation needs:    Medical: Yes    Non-medical: Yes  Tobacco Use  . Smoking status: Former Smoker    Packs/day: 2.00    Years: 30.00    Pack years: 60.00    Types: Cigarettes    Last attempt to quit: 11/02/1986    Years since quitting: 32.0  . Smokeless tobacco: Never Used  . Tobacco comment: smoked [place], up to 3 ppd (!)  Substance and Sexual Activity  . Alcohol use: No  . Drug use: No  . Sexual activity: Not on file  Lifestyle  . Physical activity:    Days per week: Patient refused    Minutes per  session: Patient refused  . Stress: Not at all  Relationships  . Social connections:    Talks on phone: Patient refused    Gets together: Patient refused    Attends religious service: Patient refused    Active member of club or organization: Patient refused    Attends meetings of clubs or organizations: Patient refused    Relationship status: Patient refused  . Intimate partner violence:    Fear of current or ex partner: Patient refused    Emotionally abused: Patient refused    Physically abused: Patient refused    Forced sexual activity: Patient refused  Other Topics Concern  . Not on file  Social History Narrative   [employer] - does maintenance  Allergies  Allergen Reactions  . Brilinta [Ticagrelor] Other (See Comments)    Arthralgias & myalgias  . Crestor [Rosuvastatin] Other (See Comments)    Myalgias     Outpatient Medications Prior to Visit  Medication Sig Dispense Refill  . acetaminophen (TYLENOL) 325 MG tablet Take 2 tablets (650 mg total) by mouth every 6 (six) hours as needed for mild pain.    [name] Kitchen albuterol (PROVENTIL HFA;VENTOLIN HFA) 108 (90 Base) MCG/ACT inhaler Inhale 2 puffs into the lungs every 6 (six) hours as needed for wheezing or shortness of breath. 1 Inhaler 2  . amiodarone (PACERONE) 200 MG tablet Take 1 tablet (200 mg total) by mouth 2 (two) times daily. For one week;then take 200 mg daily thereafter (Patient taking differently: Take 200 mg by mouth See admin instructions. Take 200mg  by mouth twice daily for one week;then take 200 mg daily thereafter) 90 tablet 3  . apixaban (ELIQUIS) 5 MG TABS tablet Take 1 tablet (5 mg total) by mouth 2 (two) times daily. 180 tablet 1  . aspirin EC 81 MG EC tablet Take 1 tablet (81 mg total) by mouth daily.    [name] Kitchen atorvastatin (LIPITOR) 20 MG tablet TAKE 1 TABLET EVERY DAY 90 tablet 3  . cholecalciferol (VITAMIN D) 1000 UNITS tablet Take 1,000 Units by mouth every evening.     . clopidogrel (PLAVIX) 75 MG tablet      . cyclobenzaprine (FLEXERIL) 5 MG tablet Take 1 tablet (5 mg total) by mouth 2 (two) times daily as needed for muscle spasms (cramping). 30 tablet 0  . fexofenadine (ALLEGRA) 180 MG tablet Take 180 mg by mouth daily as needed for allergies.     . fluticasone (FLONASE) 50 MCG/ACT nasal spray Place 1 spray into both nostrils daily as needed for allergies.     . furosemide (LASIX) 40 MG tablet Take 1 tablet (40 mg total) by mouth 2 (two) times daily. 60 tablet 3  . guaiFENesin (MUCINEX) 600 MG 12 hr tablet Take 1 tablet (600 mg total) by mouth 2 (two) times daily as needed for cough or to loosen phlegm.    . isosorbide mononitrate (IMDUR) 60 MG 24 hr tablet TAKE 1 TABLET EVERY DAY 90 tablet 2  . metoprolol tartrate (LOPRESSOR) 25 MG tablet Take 1 tablet (25 mg total) by mouth 2 (two) times daily. 180 tablet 3  . Multiple Vitamin (MULTIVITAMIN) tablet Take 1 tablet by mouth at bedtime.     . ondansetron (ZOFRAN ODT) 4 MG disintegrating tablet Take 1 tablet (4 mg total) by mouth every 8 (eight) hours as needed for nausea or vomiting. 20 tablet 0  . prochlorperazine (COMPAZINE) 10 MG tablet Take 1 tablet (10 mg total) by mouth every 6 (six) hours as needed for nausea or vomiting. 30 tablet 0  . traMADol (ULTRAM) 50 MG tablet Take 1-2 tablets (50-100 mg total) by mouth every 6 (six) hours as needed for moderate pain or severe pain. Take 50 mg by mouth every 6-8 hours PRN for moderate pain. You can take 2 tabs for severe pain. 30 tablet 0  . vitamin E 100 UNIT capsule Take 100 Units by mouth at bedtime.     . Tiotropium Bromide-Olodaterol (STIOLTO RESPIMAT) 2.5-2.5 MCG/ACT AERS Inhale 2 puffs into the lungs daily. 1 Inhaler 5   No facility-administered medications prior to visit.         Objective:   Physical Exam Vitals:   11/07/18 0912  BP: 116/74  Pulse: 79  SpO2: 96%  Weight: 178 lb (80.7 kg)  Height: 5\' 11"  (1.803 m)   Gen: Pleasant, well-nourished, in no distress,  normal affect  ENT:  No lesions,  mouth clear,  oropharynx clear, M4 airway, no postnasal drip  Neck: No JVD, no stridor  Lungs: No use of accessory muscles, no crackles or wheezing on normal respiration  Cardiovascular: RRR, heart sounds normal, no murmur or gallops, no peripheral edema, healed sternotomy scar  Musculoskeletal: No deformities, no cyanosis or clubbing  Neuro: alert, awake, non focal  Skin: Warm, no lesions or rash      Assessment & Plan:  COPD (chronic obstructive pulmonary disease) ([place]) COPD based on his clinical history and pulmonary function testing.  He did not benefit from [facility], does not necessarily feel that his breathing is limiting him as long as he uses portable oxygen.  We will stop the Stiolto, continue to have albuterol available.  We may revisit scheduled bronchodilator therapy going forward  Malignant neoplasm of bronchus of right upper lobe ([place]) Imaging showed improvement 10/27/2018.  He will follow-up with Dr. Julien Nordmann, receiving chemotherapy  Chronic respiratory failure ([place]) With hypoxemia.  This is been documented several months ago post CABG and before treatment of his small cell lung cancer.  I want to qualify him for portable oxygen concentrator if possible.  We will repeat his walking oximetry today.  Baltazar Apo, MD, PhD 11/07/2018, 9:57 AM [facility] [phone] or if no answer [phone]

## 2018-11-07 NOTE — Assessment & Plan Note (Signed)
COPD based on his clinical history and pulmonary function testing.  He did not benefit from [facility], does not necessarily feel that his breathing is limiting him as long as he uses portable oxygen.  We will stop the Stiolto, continue to have albuterol available.  We may revisit scheduled bronchodilator therapy going forward

## 2018-11-07 NOTE — Assessment & Plan Note (Signed)
With hypoxemia.  This is been documented several months ago post CABG and before treatment of his small cell lung cancer.  I want to qualify him for portable oxygen concentrator if possible.  We will repeat his walking oximetry today.

## 2018-11-07 NOTE — Addendum Note (Signed)
Addended by: Valerie Salts on: 11/07/2018 10:39 AM   Modules accepted: Orders

## 2018-11-07 NOTE — Assessment & Plan Note (Signed)
Imaging showed improvement 10/27/2018.  He will follow-up with Dr. Julien Nordmann, receiving chemotherapy

## 2018-11-07 NOTE — Patient Instructions (Signed)
We will stop Stiolto. Keep albuterol available to use 2 puffs if needed for shortness of breath, chest tightness, wheezing. Continue oxygen at 2 L/min with exertion.  We will repeat your walking oximetry today to see if we can qualify you for a portable oxygen concentrator. Continue your therapy and follow up imaging as directed by Dr. Julien Nordmann. Follow with Dr Lamonte Sakai in 6 months or sooner if you have any problems

## 2018-11-08 ENCOUNTER — Telehealth: Payer: Self-pay | Admitting: Internal Medicine

## 2018-11-08 ENCOUNTER — Other Ambulatory Visit: Payer: Self-pay | Admitting: Medical Oncology

## 2018-11-08 DIAGNOSIS — D649 Anemia, unspecified: Secondary | ICD-10-CM

## 2018-11-08 NOTE — Telephone Encounter (Signed)
Called patient per 1/6 sch message - left message for patient with appt date and time

## 2018-11-09 NOTE — Progress Notes (Signed)
  Radiation Oncology         [phone] ________________________________  Name: Gregory Bates MRN: [id number]  Date: 08/18/2018  DOB: 1941/10/25  RESPIRATORY MOTION MANAGEMENT SIMULATION  NARRATIVE:  In order to account for effect of respiratory motion on target structures and other organs in the planning and delivery of radiotherapy, this patient underwent respiratory motion management simulation.  To accomplish this, when the patient was brought to the CT simulation planning suite, 4D respiratoy motion management CT images were obtained.  The CT images were loaded into the planning software.  Then, using a variety of tools including Cine, MIP, and standard views, the target volume and planning target volumes (PTV) were delineated.  Avoidance structures were contoured.  Treatment planning then occurred.  Dose volume histograms were generated and reviewed for each of the requested structure.  The resulting plan was carefully reviewed and approved today.   ------------------------------------------------  Jodelle Gross, MD, PhD

## 2018-11-09 NOTE — Progress Notes (Signed)
  Radiation Oncology         [phone]) [phone] ________________________________  Name: Gregory Bates MRN: [id number]  Date: 08/18/2018  DOB: 29-Dec-1940  SIMULATION AND TREATMENT PLANNING NOTE  DIAGNOSIS:     ICD-10-CM   1. Malignant neoplasm of bronchus of right upper lobe (HCC) C34.11      Site:  chest  NARRATIVE:  The patient was brought to the [facility].  Identity was confirmed.  All relevant records and images related to the planned course of therapy were reviewed.   Written consent to proceed with treatment was confirmed which was freely given after reviewing the details related to the planned course of therapy had been reviewed with the patient.  Then, the patient was set-up in a stable reproducible  supine position for radiation therapy.  CT images were obtained.  Surface markings were placed.    Medically necessary complex treatment device(s) for immobilization:  Vac-lock bag.   The CT images were loaded into the planning software.  Then the target and avoidance structures were contoured.  Treatment planning then occurred.  The radiation prescription was entered and confirmed.  A total of 5 complex treatment devices were fabricated which relate to the designed radiation treatment fields. Additional reduced fields will be used as necessary to improve the dose homogeneity of the plan. Each of these customized fields/ complex treatment devices will be used on a daily basis during the radiation course. I have requested : 3D Simulation  I have requested a DVH of the following structures: target volume, spinal cord, lungs, heart.   The patient will undergo daily image guidance to ensure accurate localization of the target, and adequate minimize dose to the normal surrounding structures in close proximity to the target.  PLAN:  The patient will receive 60 Gy in 30 fractions initially. The patient will then receive a 6 Gy boost for a final dose of 66 Gy.  Special treatment  procedure The patient will also receive concurrent chemotherapy during the treatment. The patient may therefore experience increased toxicity or side effects and the patient will be monitored for such problems. This may require extra lab work as necessary. This therefore constitutes a special treatment procedure.   ________________________________   Jodelle Gross, MD, PhD

## 2018-11-11 ENCOUNTER — Inpatient Hospital Stay: Payer: Medicare HMO | Admitting: Lab

## 2018-11-11 DIAGNOSIS — C3411 Malignant neoplasm of upper lobe, right bronchus or lung: Secondary | ICD-10-CM | POA: Diagnosis not present

## 2018-11-11 DIAGNOSIS — D649 Anemia, unspecified: Secondary | ICD-10-CM

## 2018-11-11 DIAGNOSIS — Z5112 Encounter for antineoplastic immunotherapy: Secondary | ICD-10-CM | POA: Diagnosis not present

## 2018-11-11 DIAGNOSIS — Z5111 Encounter for antineoplastic chemotherapy: Secondary | ICD-10-CM | POA: Diagnosis not present

## 2018-11-11 DIAGNOSIS — I4891 Unspecified atrial fibrillation: Secondary | ICD-10-CM | POA: Diagnosis not present

## 2018-11-11 DIAGNOSIS — Z7689 Persons encountering health services in other specified circumstances: Secondary | ICD-10-CM | POA: Diagnosis not present

## 2018-11-11 DIAGNOSIS — N289 Disorder of kidney and ureter, unspecified: Secondary | ICD-10-CM | POA: Diagnosis not present

## 2018-11-11 DIAGNOSIS — C7989 Secondary malignant neoplasm of other specified sites: Secondary | ICD-10-CM | POA: Diagnosis not present

## 2018-11-11 DIAGNOSIS — I2510 Atherosclerotic heart disease of native coronary artery without angina pectoris: Secondary | ICD-10-CM | POA: Diagnosis not present

## 2018-11-11 DIAGNOSIS — J439 Emphysema, unspecified: Secondary | ICD-10-CM | POA: Diagnosis not present

## 2018-11-11 LAB — ABO/RH: ABO/RH(D): B POS

## 2018-11-11 LAB — PREPARE RBC (CROSSMATCH)

## 2018-11-12 ENCOUNTER — Inpatient Hospital Stay: Payer: Medicare HMO

## 2018-11-12 DIAGNOSIS — Z5111 Encounter for antineoplastic chemotherapy: Secondary | ICD-10-CM | POA: Diagnosis not present

## 2018-11-12 DIAGNOSIS — Z5112 Encounter for antineoplastic immunotherapy: Secondary | ICD-10-CM | POA: Diagnosis not present

## 2018-11-12 DIAGNOSIS — J439 Emphysema, unspecified: Secondary | ICD-10-CM | POA: Diagnosis not present

## 2018-11-12 DIAGNOSIS — D649 Anemia, unspecified: Secondary | ICD-10-CM

## 2018-11-12 DIAGNOSIS — I4891 Unspecified atrial fibrillation: Secondary | ICD-10-CM | POA: Diagnosis not present

## 2018-11-12 DIAGNOSIS — Z7689 Persons encountering health services in other specified circumstances: Secondary | ICD-10-CM | POA: Diagnosis not present

## 2018-11-12 DIAGNOSIS — I2510 Atherosclerotic heart disease of native coronary artery without angina pectoris: Secondary | ICD-10-CM | POA: Diagnosis not present

## 2018-11-12 DIAGNOSIS — C3411 Malignant neoplasm of upper lobe, right bronchus or lung: Secondary | ICD-10-CM | POA: Diagnosis not present

## 2018-11-12 DIAGNOSIS — C7989 Secondary malignant neoplasm of other specified sites: Secondary | ICD-10-CM | POA: Diagnosis not present

## 2018-11-12 DIAGNOSIS — N289 Disorder of kidney and ureter, unspecified: Secondary | ICD-10-CM | POA: Diagnosis not present

## 2018-11-12 MED ORDER — SODIUM CHLORIDE 0.9% IV SOLUTION
250.0000 mL | Freq: Once | INTRAVENOUS | Status: AC
  Administered 2018-11-12: 250 mL via INTRAVENOUS
  Filled 2018-11-12: qty 250

## 2018-11-12 MED ORDER — DIPHENHYDRAMINE HCL 25 MG PO CAPS
ORAL_CAPSULE | ORAL | Status: AC
  Filled 2018-11-12: qty 1

## 2018-11-12 MED ORDER — ACETAMINOPHEN 325 MG PO TABS
ORAL_TABLET | ORAL | Status: AC
  Filled 2018-11-12: qty 2

## 2018-11-12 MED ORDER — DIPHENHYDRAMINE HCL 25 MG PO CAPS
25.0000 mg | ORAL_CAPSULE | Freq: Once | ORAL | Status: AC
  Administered 2018-11-12: 25 mg via ORAL

## 2018-11-12 MED ORDER — ACETAMINOPHEN 325 MG PO TABS
650.0000 mg | ORAL_TABLET | Freq: Once | ORAL | Status: AC
  Administered 2018-11-12: 650 mg via ORAL

## 2018-11-13 ENCOUNTER — Other Ambulatory Visit: Payer: Self-pay | Admitting: Internal Medicine

## 2018-11-14 ENCOUNTER — Inpatient Hospital Stay: Payer: Medicare HMO

## 2018-11-14 DIAGNOSIS — Z5111 Encounter for antineoplastic chemotherapy: Secondary | ICD-10-CM | POA: Diagnosis not present

## 2018-11-14 DIAGNOSIS — I5032 Chronic diastolic (congestive) heart failure: Secondary | ICD-10-CM | POA: Diagnosis not present

## 2018-11-14 DIAGNOSIS — N289 Disorder of kidney and ureter, unspecified: Secondary | ICD-10-CM | POA: Diagnosis not present

## 2018-11-14 DIAGNOSIS — C3490 Malignant neoplasm of unspecified part of unspecified bronchus or lung: Secondary | ICD-10-CM | POA: Diagnosis not present

## 2018-11-14 DIAGNOSIS — C3411 Malignant neoplasm of upper lobe, right bronchus or lung: Secondary | ICD-10-CM | POA: Diagnosis not present

## 2018-11-14 DIAGNOSIS — Z7689 Persons encountering health services in other specified circumstances: Secondary | ICD-10-CM | POA: Diagnosis not present

## 2018-11-14 DIAGNOSIS — C7989 Secondary malignant neoplasm of other specified sites: Secondary | ICD-10-CM | POA: Diagnosis not present

## 2018-11-14 DIAGNOSIS — Z5112 Encounter for antineoplastic immunotherapy: Secondary | ICD-10-CM | POA: Diagnosis not present

## 2018-11-14 DIAGNOSIS — J439 Emphysema, unspecified: Secondary | ICD-10-CM | POA: Diagnosis not present

## 2018-11-14 DIAGNOSIS — C9000 Multiple myeloma not having achieved remission: Secondary | ICD-10-CM

## 2018-11-14 DIAGNOSIS — C3491 Malignant neoplasm of unspecified part of right bronchus or lung: Secondary | ICD-10-CM

## 2018-11-14 DIAGNOSIS — I2510 Atherosclerotic heart disease of native coronary artery without angina pectoris: Secondary | ICD-10-CM | POA: Diagnosis not present

## 2018-11-14 DIAGNOSIS — R0602 Shortness of breath: Secondary | ICD-10-CM | POA: Diagnosis not present

## 2018-11-14 DIAGNOSIS — I4891 Unspecified atrial fibrillation: Secondary | ICD-10-CM | POA: Diagnosis not present

## 2018-11-14 LAB — CBC WITH DIFFERENTIAL (CANCER CENTER ONLY)
Abs Immature Granulocytes: 0.12 10*3/uL — ABNORMAL HIGH (ref 0.00–0.07)
BASOS ABS: 0 10*3/uL (ref 0.0–0.1)
Basophils Relative: 0 %
EOS PCT: 0 %
Eosinophils Absolute: 0 10*3/uL (ref 0.0–0.5)
HCT: 30.6 % — ABNORMAL LOW (ref 39.0–52.0)
Hemoglobin: 9.6 g/dL — ABNORMAL LOW (ref 13.0–17.0)
Immature Granulocytes: 2 %
LYMPHS PCT: 10 %
Lymphs Abs: 0.8 10*3/uL (ref 0.7–4.0)
MCH: 30.3 pg (ref 26.0–34.0)
MCHC: 31.4 g/dL (ref 30.0–36.0)
MCV: 96.5 fL (ref 80.0–100.0)
Monocytes Absolute: 0.8 10*3/uL (ref 0.1–1.0)
Monocytes Relative: 10 %
Neutro Abs: 6.4 10*3/uL (ref 1.7–7.7)
Neutrophils Relative %: 78 %
Platelet Count: 56 10*3/uL — ABNORMAL LOW (ref 150–400)
RBC: 3.17 MIL/uL — ABNORMAL LOW (ref 4.22–5.81)
RDW: 19.2 % — ABNORMAL HIGH (ref 11.5–15.5)
WBC Count: 8.2 10*3/uL (ref 4.0–10.5)
nRBC: 0 % (ref 0.0–0.2)

## 2018-11-14 LAB — BPAM RBC
BLOOD PRODUCT EXPIRATION DATE: 202002092359
Blood Product Expiration Date: 202002092359
ISSUE DATE / TIME: 202001111008
ISSUE DATE / TIME: 202001111159
Unit Type and Rh: 7300
Unit Type and Rh: 7300

## 2018-11-14 LAB — TYPE AND SCREEN
ABO/RH(D): B POS
Antibody Screen: NEGATIVE
Unit division: 0
Unit division: 0

## 2018-11-14 LAB — SAMPLE TO BLOOD BANK

## 2018-11-16 ENCOUNTER — Telehealth: Payer: Self-pay | Admitting: Emergency Medicine

## 2018-11-16 DIAGNOSIS — J9611 Chronic respiratory failure with hypoxia: Secondary | ICD-10-CM

## 2018-11-16 NOTE — Telephone Encounter (Signed)
I have placed order for the best fit POC to [facility]. Patient is aware. Nothing further needed.

## 2018-11-16 NOTE — Telephone Encounter (Signed)
Called and spoke with patient, he stated that the company who supplies [employer] will not take his insurance. Patient called [facility] and they stated that they have a POC that will plug into his cigarette lighter in the car and he would like to get this. [facility] stated that they will try to get it out to him. Called [place] with [facility] and I was unable to reach. Left a message to give [place] a call back.   RB please advise if this is ok to send in. Thank you.

## 2018-11-16 NOTE — Telephone Encounter (Signed)
Yes - Ok to send

## 2018-11-17 ENCOUNTER — Other Ambulatory Visit: Payer: Self-pay

## 2018-11-17 ENCOUNTER — Ambulatory Visit (INDEPENDENT_AMBULATORY_CARE_PROVIDER_SITE_OTHER): Payer: Medicare HMO | Admitting: Cardiothoracic Surgery

## 2018-11-17 ENCOUNTER — Encounter: Payer: Self-pay | Admitting: Cardiothoracic Surgery

## 2018-11-17 VITALS — BP 93/52 | HR 60 | Resp 16 | Ht 71.0 in | Wt 177.4 lb

## 2018-11-17 DIAGNOSIS — Z951 Presence of aortocoronary bypass graft: Secondary | ICD-10-CM

## 2018-11-17 DIAGNOSIS — I2510 Atherosclerotic heart disease of native coronary artery without angina pectoris: Secondary | ICD-10-CM | POA: Diagnosis not present

## 2018-11-17 DIAGNOSIS — R918 Other nonspecific abnormal finding of lung field: Secondary | ICD-10-CM

## 2018-11-17 NOTE — Progress Notes (Signed)
[address]       [place] [address]             [phone]      Gregory Bates [facility] Medical Record #[id number] Date of Birth: [birth year]  Referring: Burnell Blanks* Primary Care: Street, Sharon Mt, MD Primary Cardiologist: Lauree Chandler, MD   Chief Complaint:   POST OP FOLLOW UP OPERATIVE REPORT  DATE OF PROCEDURE:  08/01/2018  PREOPERATIVE DIAGNOSES:   1.  Coronary artery bypass grafting 2.  Right hilar mass.  POSTOPERATIVE DIAGNOSES:   1.  Coronary artery bypass grafting 2.  Right hilar mass with quick stain.   3.  Positive small cell carcinoma of the lung.  PROCEDURE PERFORMED:   1.  Coronary artery bypass grafting x3 with left internal mammary to the left anterior descending coronary artery, reverse saphenous vein graft to the obtuse marginal, reverse saphenous vein graft to the posterior descending coronary artery with right  greater saphenous endoscopic vein harvesting.  Placement of AtriCure left atrial clip 50 mm.   2.  Biopsy of the intrapericardial lymph node and biopsy of right hilar mass.  SURGEON:  Lanelle Bal, MD History of Present Illness:     Patient notes that he feels better, has better breathing capacity than he did last month.  Has been tolerating current chemotherapy well.  Has another round of chemo starting early next week  Extensive stage (T1 a, N2, M1a)extensive stage small cell lung cancer presented with right lung nodules in addition to mediastinal lymphadenopathy as well as pericardial metastasis diagnosed in October 2019 Current treatment:tolerating his systemic chemotherapy with carboplatin, etoposide and Tecentriq  Past Medical History:  Diagnosis Date  . Acute renal insufficiency    a. During 11/2013 adm with atrial flutter.  . Anemia, unspecified   . Atrial fibrillation ([place])   . CAD (coronary artery disease), native coronary artery    a. s/p cath May 2011 >> DES distal RCA;   b. 01/2013 NSTEMI >> OM1 99p (2.75x12 Promus Premier DES);  c. LHC (2/15): PCI: Promus DES to dist RCA ;  d. LHC 1/16 - dLM 8, oLAD 30, small D1 tandem 64, pOM1 stent ok, mOM 50-60, dOM bifurcation 50-60 in both vessels, AV 81-70 jailed by stent, pRCA 79 mRCA stent ok, dRCA stent ok, PLB smal 50, dPDA 60, 80; EF 35-40 >> med Rx - PCI of PDA if angina  . Chronic combined systolic and diastolic CHF (congestive heart failure) ([place]) 12/23/2015   Echo 2/17: Mild LVH, EF 45-50%, inferior akinesis, grade 2 diastolic dysfunction, mild AI, mild MR, mild LAE, PASP 44 mmHg   . Colorectal polyps 2002  . Diabetes mellitus    AODM  . Erectile dysfunction   . History of echocardiogram    a. 01/2013 Echo: EF 55%, mid-dist inflat HK, Gr1 DD, Triv AI/TR, Mild MR.;  b.  Echo (1/15): Mild LVH, EF 55%, inferolateral hypokinesis, grade 1 diastolic dysfunction, mild AI, trivial MR, moderate LAE, PASP 36 mmHg   . Hx of cardiovascular stress test    Stress myoview 08/07/13 with LVEF 44%, inferior scar, no ischemia  . Hyperlipidemia   . Hypertension   . Hyponatremia   . Ischemic cardiomyopathy    a. EF 35-40% by LHC in 1/16; b. Echo 2/17: Mild LVH, EF 45-50%, inferior akinesis, grade 2 diastolic dysfunction, mild AI, mild MR, mild LAE, PASP 44 mmHg  . Myocardial infarction ([place])   . Oxygen deficiency   .  PAF (paroxysmal atrial fibrillation) ([place])    a. Dx 11/2013. Spont conv. Placed on eliquis.  . Small cell lung cancer in adult [facility]) 07/28/2018     Social History   Tobacco Use  Smoking Status Former Smoker  . Packs/day: 2.00  . Years: 30.00  . Pack years: 60.00  . Types: Cigarettes  . Last attempt to quit: 11/02/1986  . Years since quitting: 32.0  Smokeless Tobacco Never Used  Tobacco Comment   smoked [place], up to 3 ppd (!)    Social History   Substance and Sexual Activity  Alcohol Use No     Allergies  Allergen Reactions  . Brilinta [Ticagrelor] Other (See Comments)    Arthralgias & myalgias    . Crestor [Rosuvastatin] Other (See Comments)    Myalgias    Current Outpatient Medications  Medication Sig Dispense Refill  . acetaminophen (TYLENOL) 325 MG tablet Take 2 tablets (650 mg total) by mouth every 6 (six) hours as needed for mild pain.    [name] Kitchen albuterol (PROVENTIL HFA;VENTOLIN HFA) 108 (90 Base) MCG/ACT inhaler Inhale 2 puffs into the lungs every 6 (six) hours as needed for wheezing or shortness of breath. 1 Inhaler 2  . amiodarone (PACERONE) 200 MG tablet Take 1 tablet (200 mg total) by mouth 2 (two) times daily. For one week;then take 200 mg daily thereafter (Patient taking differently: Take 200 mg by mouth See admin instructions. Take 200mg  by mouth twice daily for one week;then take 200 mg daily thereafter) 90 tablet 3  . apixaban (ELIQUIS) 5 MG TABS tablet Take 1 tablet (5 mg total) by mouth 2 (two) times daily. 180 tablet 1  . aspirin EC 81 MG EC tablet Take 1 tablet (81 mg total) by mouth daily.    [name] Kitchen atorvastatin (LIPITOR) 20 MG tablet TAKE 1 TABLET EVERY DAY 90 tablet 3  . cholecalciferol (VITAMIN D) 1000 UNITS tablet Take 1,000 Units by mouth every evening.     . clopidogrel (PLAVIX) 75 MG tablet     . cyclobenzaprine (FLEXERIL) 5 MG tablet Take 1 tablet (5 mg total) by mouth 2 (two) times daily as needed for muscle spasms (cramping). 30 tablet 0  . fexofenadine (ALLEGRA) 180 MG tablet Take 180 mg by mouth daily as needed for allergies.     . fluticasone (FLONASE) 50 MCG/ACT nasal spray Place 1 spray into both nostrils daily as needed for allergies.     . furosemide (LASIX) 40 MG tablet Take 1 tablet (40 mg total) by mouth 2 (two) times daily. 60 tablet 3  . guaiFENesin (MUCINEX) 600 MG 12 hr tablet Take 1 tablet (600 mg total) by mouth 2 (two) times daily as needed for cough or to loosen phlegm.    . isosorbide mononitrate (IMDUR) 60 MG 24 hr tablet TAKE 1 TABLET EVERY DAY 90 tablet 2  . metoprolol tartrate (LOPRESSOR) 25 MG tablet Take 1 tablet (25 mg total) by mouth 2  (two) times daily. 180 tablet 3  . Multiple Vitamin (MULTIVITAMIN) tablet Take 1 tablet by mouth at bedtime.     . ondansetron (ZOFRAN ODT) 4 MG disintegrating tablet Take 1 tablet (4 mg total) by mouth every 8 (eight) hours as needed for nausea or vomiting. 20 tablet 0  . prochlorperazine (COMPAZINE) 10 MG tablet Take 1 tablet (10 mg total) by mouth every 6 (six) hours as needed for nausea or vomiting. 30 tablet 0  . vitamin E 100 UNIT capsule Take 100 Units by mouth at bedtime.  No current facility-administered medications for this visit.        Physical Exam: BP (!) 93/52 (BP Location: Right Arm, Patient Position: Sitting, Cuff Size: Large)   Pulse 60   Resp 16   Ht 5\' 11"  (1.803 m)   Wt 177 lb 6.4 oz (80.5 kg)   SpO2 98% Comment: ON 2L 2  BMI 24.74 kg/m  General appearance: alert and cooperative Head: Normocephalic, without obvious abnormality, atraumatic Neck: no adenopathy, no carotid bruit, no JVD, supple, symmetrical, trachea midline and thyroid not enlarged, symmetric, no tenderness/mass/nodules Lymph nodes: Cervical, supraclavicular, and axillary nodes normal. Resp: clear to auscultation bilaterally Back: symmetric, no curvature. ROM normal. No CVA tenderness. Cardio: regular rate and rhythm, S1, S2 normal, no murmur, click, rub or gallop GI: soft, non-tender; bowel sounds normal; no masses,  no organomegaly Extremities: extremities normal, atraumatic, no cyanosis or edema and Homans sign is negative, no sign of DVT Neurologic: Grossly normal  Patient's sternum is stable and well-healed, he was concerned about an IV site in the right hand, this does not appear inflamed or infected.   Diagnostic Studies & Laboratory data:     Recent Radiology Findings:  Ct Chest W Contrast  Result Date: 10/27/2018 CLINICAL DATA:  Cancer with ongoing chemotherapy. EXAM: CT CHEST, ABDOMEN, AND PELVIS WITH CONTRAST TECHNIQUE: Multidetector CT imaging of the chest, abdomen and pelvis  was performed following the standard protocol during bolus administration of intravenous contrast. CONTRAST:  167mL OMNIPAQUE IOHEXOL 300 MG/ML  SOLN COMPARISON:  PET 09/09/2018, CT chest 08/13/2018 and CT abdomen pelvis 10/16/2017. FINDINGS: CT CHEST FINDINGS Cardiovascular: Atherosclerotic calcification of the aorta. Heart is enlarged. No pericardial effusion. Mediastinum/Nodes: Continued improvement in mediastinal adenopathy. Residual low right paratracheal nodal lesion is now somewhat ill-defined, measuring approximately 1.6 x 2.1 cm, compared to 2.5 x 2.9 cm on 09/09/2018. No hilar or axillary adenopathy. Esophagus is grossly unremarkable. Lungs/Pleura: Areas of volume loss and amorphous ground-glass are again seen in the right upper lobe. Moderate right pleural effusion, slightly decreased. Left pleural effusion is new 09/09/2018. Centrilobular and paraseptal emphysema. Mild volume loss in the lingula. Adherent debris in the distal bronchus intermedius. Airway is otherwise unremarkable. Musculoskeletal: Degenerative changes in the spine. No worrisome lytic or sclerotic lesions. Probable scattered spinal hemangiomas. CT ABDOMEN PELVIS FINDINGS Hepatobiliary: Liver and gallbladder are unremarkable. No biliary ductal dilatation. Pancreas: Negative. Spleen: Negative. Adrenals/Urinary Tract: Adrenal glands are unremarkable. Previously seen hypermetabolic lesion off the anterior lower pole right kidney has nearly completely resolved with only a small area of residual soft tissue thickening (series 2, image 76). 3.2 cm low-attenuation lesion in the interpolar right kidney is likely a cyst. Subcentimeter exophytic low-attenuation lesion off the interpolar left kidney is too small to characterize. Ureters are decompressed. Bladder is grossly unremarkable. Stomach/Bowel: Small hiatal hernia. Stomach is decompressed. Small bowel, appendix and colon are unremarkable. Vascular/Lymphatic: Atherosclerotic calcification of  the aorta. Infrarenal aorta measures up to 3.5 cm (sagittal series 6, image 106). Portacaval lymph node measures 9 mm (series 2, image 63), previously 1.3 cm. No pathologically enlarged lymph nodes. Reproductive: Prostate is visualized. Other: Small bilateral inguinal hernias contain fat. No free fluid. Mesenteries and peritoneum are unremarkable. Musculoskeletal: Degenerative changes in the spine. No lytic or sclerotic lesions. Slight retrolisthesis of L5 on S1. IMPRESSION: 1. Continue response to therapy as evidenced by decreasing mediastinal adenopathy, smaller portacaval lymph node and near complete resolution of a right perinephric metastasis. Hypermetabolic osseous metastatic disease seen on 09/09/2018 remains occult on CT  imaging. 2. Areas of ill-defined nodularity/ground-glass in the right upper lobe, similar to 09/09/2018. Continued attention on follow-up exams is warranted. 3. Moderate right pleural effusion, decreased. New small left pleural effusion. 4. Infrarenal aortic aneurysm. Recommend followup by ultrasound in 2 years. This recommendation follows ACR consensus guidelines: White Paper of the ACR Incidental Findings Committee II on Vascular Findings. J Am Coll Radiol 2013; 10:789-794. 5.  Aortic atherosclerosis (ICD10-170.0). 6.  Emphysema (ICD10-J43.9). Electronically Signed   By: Lorin Picket M.D.   On: 10/27/2018 13:34   Nm Pet Image Initial (pi) Skull Base To Thigh  Result Date: 09/09/2018 CLINICAL DATA:  Initial treatment strategy for small cell lung cancer. EXAM: NUCLEAR MEDICINE PET SKULL BASE TO THIGH TECHNIQUE: 8.9 mCi F-18 FDG was injected intravenously. Full-ring PET imaging was performed from the skull base to thigh after the radiotracer. CT data was obtained and used for attenuation correction and anatomic localization. Fasting blood glucose: 113 mg/dl COMPARISON:  Portable chest 08/15/2018.  Chest CT 08/13/2018 FINDINGS: Mediastinal blood pool activity: SUV max 1.8 NECK: No  hypermetabolic cervical lymph nodes are identified.There are no lesions of the pharyngeal mucosal space. Incidental CT findings: Bilateral carotid atherosclerosis. CHEST: There are several hypermetabolic right hilar and right mediastinal lymph nodes. Right paratracheal node measuring 2.9 x 2.5 cm on image 68/4 has an SUV max of 6.6. 2.3 x 3.1 cm nodule anterior to the right hilum on image 74/4 has an SUV max of 9.4. There is no axillary or contralateral hilar adenopathy. There are no peripheral hypermetabolic pulmonary lesions. There is no abnormal activity within a moderate size dependent right pleural effusion. Incidental CT findings: There is interval improved aeration of the right upper lobe with mild residual atelectasis. There is mild dependent atelectasis in the right lower lobe. Underlying moderate centrilobular emphysema and diffuse atherosclerosis are noted post CABG. ABDOMEN/PELVIS: There is no hypermetabolic activity within the liver, adrenal glands, spleen or pancreas. 13 mm portacaval node on image 706/2 is hypermetabolic with an SUV max of 6.2. No other hypermetabolic abdominopelvic lymph nodes are demonstrated. 16 mm exophytic lesion projecting anteriorly from the right kidney (image 137/4) measures 16 HU and appears mildly hypermetabolic (SUV max 4.8). Incidental CT findings: In addition to the small hypermetabolic right renal lesion, there are additional renal lesions bilaterally which are not hypermetabolic, including a 3.3 cm cyst posteriorly in the mid right kidney. 16 mm exophytic lesion on the left (image 134/4) is not hypermetabolic. There is diffuse aortic and branch vessel atherosclerosis with a 3.8 cm abdominal aortic aneurysm. Diverticular changes are present within the sigmoid colon. SKELETON: There is diffuse hypermetabolic activity throughout the skeleton, suspicious for widespread osseous metastatic disease. For example, in the L3 vertebral body, the SUV max is 16.3. No focal lytic  lesion or pathologic fracture is identified. There are relative areas of sparing in the spine which are attributed to underlying hemangiomas. Incidental CT findings: none IMPRESSION: 1. Right hilar and mediastinal hypermetabolic lymphadenopathy consistent with small cell lung cancer. No peripheral hypermetabolic pulmonary activity identified. 2. Single hypermetabolic portacaval node, suspicious for metastatic disease. 3. Widespread osseous hypermetabolic activity consistent with metastatic disease. 4. Indeterminate small hypermetabolic right renal lesion, also suspicious for neoplasm. This could reflect metastatic disease or renal cell carcinoma. Attention on follow-up after treatment of the patient's small cell lung cancer recommended. 5. Interval improved aeration of the right upper lobe. Moderate right pleural effusion without hypermetabolic activity. 6. Extensive Aortic Atherosclerosis (ICD10-I70.0). 3.8 cm abdominal aortic aneurysm. Aortic Atherosclerosis (ICD10-I70.0)  and Emphysema (ICD10-J43.9). Electronically Signed   By: Richardean Sale M.D.   On: 09/09/2018 17:19     Recent Lab Findings: Lab Results  Component Value Date   WBC 8.2 11/14/2018   HGB 9.6 (L) 11/14/2018   HCT 30.6 (L) 11/14/2018   PLT 56 (L) 11/14/2018   GLUCOSE 201 (H) 11/07/2018   CHOL 170 11/04/2013   TRIG 166 (H) 11/04/2013   HDL 34 (L) 11/04/2013   LDLDIRECT 69.3 11/26/2008   LDLCALC 103 (H) 11/04/2013   ALT 16 11/07/2018   AST 14 (L) 11/07/2018   NA 141 11/07/2018   K 4.1 11/07/2018   CL 105 11/07/2018   CREATININE 1.34 (H) 11/07/2018   BUN 25 (H) 11/07/2018   CO2 28 11/07/2018   TSH 0.239 (L) 11/07/2018   INR 1.35 08/01/2018   HGBA1C 6.4 (H) 07/31/2018      Assessment / Plan:   Patient stable following urgent coronary artery bypass grafting for severe left main disease, and simultaneously found to have Extensive stage (T1 a, N2, M1a)extensive stage small cell lung cancer presented with right lung nodules  in addition to mediastinal lymphadenopathy as well as pericardial metastasis diagnosed in October 2019.  He seems to be responding to her current chemotherapy based on CT scan and symptomatology He is doing well from a cardiac standpoint I have not made a follow-up appointment for him to see me as he is closely followed by cardiology and medical oncology  Grace Isaac MD      [address] [place] [address] Office [phone]   Beeper [phone]  11/17/2018 10:31 AM

## 2018-11-18 ENCOUNTER — Telehealth: Payer: Self-pay

## 2018-11-18 DIAGNOSIS — J9611 Chronic respiratory failure with hypoxia: Secondary | ICD-10-CM

## 2018-11-18 NOTE — Telephone Encounter (Signed)
[name], CMA        This patient will need a referral to another DME for POC per message from [facility].   Thank you,  Earnest Bailey   Previous Messages    ----- Message -----  From: Jiles Crocker  Sent: 11/17/2018  9:35 AM EST  To: Satira Anis  Subject: RE: Best Fit                   I left you a VM on this one. The person that spoke with the pt set false expectations. We do not have POCs at this time. There is no waiting list. The best advice that I can give is to check back in April once the [place] has taken over. If they pt truly wants a POC, they will have to transition to another provider. Unfortunately, my hands are tied.       Called patient, unable to reach. Left message to give [place] a call back.

## 2018-11-21 ENCOUNTER — Inpatient Hospital Stay: Payer: Medicare HMO

## 2018-11-21 ENCOUNTER — Inpatient Hospital Stay (HOSPITAL_BASED_OUTPATIENT_CLINIC_OR_DEPARTMENT_OTHER): Payer: Medicare HMO | Admitting: Internal Medicine

## 2018-11-21 ENCOUNTER — Encounter: Payer: Self-pay | Admitting: Internal Medicine

## 2018-11-21 VITALS — BP 120/56 | HR 78 | Temp 98.2°F | Resp 18 | Ht 71.0 in | Wt 174.1 lb

## 2018-11-21 DIAGNOSIS — K449 Diaphragmatic hernia without obstruction or gangrene: Secondary | ICD-10-CM

## 2018-11-21 DIAGNOSIS — I255 Ischemic cardiomyopathy: Secondary | ICD-10-CM

## 2018-11-21 DIAGNOSIS — C3490 Malignant neoplasm of unspecified part of unspecified bronchus or lung: Secondary | ICD-10-CM

## 2018-11-21 DIAGNOSIS — I2510 Atherosclerotic heart disease of native coronary artery without angina pectoris: Secondary | ICD-10-CM

## 2018-11-21 DIAGNOSIS — C3411 Malignant neoplasm of upper lobe, right bronchus or lung: Secondary | ICD-10-CM | POA: Diagnosis not present

## 2018-11-21 DIAGNOSIS — J90 Pleural effusion, not elsewhere classified: Secondary | ICD-10-CM

## 2018-11-21 DIAGNOSIS — C7989 Secondary malignant neoplasm of other specified sites: Secondary | ICD-10-CM | POA: Diagnosis not present

## 2018-11-21 DIAGNOSIS — I4891 Unspecified atrial fibrillation: Secondary | ICD-10-CM

## 2018-11-21 DIAGNOSIS — Z5111 Encounter for antineoplastic chemotherapy: Secondary | ICD-10-CM | POA: Diagnosis not present

## 2018-11-21 DIAGNOSIS — I252 Old myocardial infarction: Secondary | ICD-10-CM

## 2018-11-21 DIAGNOSIS — N289 Disorder of kidney and ureter, unspecified: Secondary | ICD-10-CM | POA: Diagnosis not present

## 2018-11-21 DIAGNOSIS — I110 Hypertensive heart disease with heart failure: Secondary | ICD-10-CM

## 2018-11-21 DIAGNOSIS — J439 Emphysema, unspecified: Secondary | ICD-10-CM | POA: Diagnosis not present

## 2018-11-21 DIAGNOSIS — K4020 Bilateral inguinal hernia, without obstruction or gangrene, not specified as recurrent: Secondary | ICD-10-CM

## 2018-11-21 DIAGNOSIS — Z5112 Encounter for antineoplastic immunotherapy: Secondary | ICD-10-CM | POA: Diagnosis not present

## 2018-11-21 DIAGNOSIS — I700 Atherosclerosis of aorta: Secondary | ICD-10-CM

## 2018-11-21 DIAGNOSIS — Z7689 Persons encountering health services in other specified circumstances: Secondary | ICD-10-CM | POA: Diagnosis not present

## 2018-11-21 DIAGNOSIS — E785 Hyperlipidemia, unspecified: Secondary | ICD-10-CM

## 2018-11-21 DIAGNOSIS — E119 Type 2 diabetes mellitus without complications: Secondary | ICD-10-CM

## 2018-11-21 DIAGNOSIS — Z79899 Other long term (current) drug therapy: Secondary | ICD-10-CM

## 2018-11-21 DIAGNOSIS — I10 Essential (primary) hypertension: Secondary | ICD-10-CM

## 2018-11-21 DIAGNOSIS — I480 Paroxysmal atrial fibrillation: Secondary | ICD-10-CM

## 2018-11-21 DIAGNOSIS — Z7982 Long term (current) use of aspirin: Secondary | ICD-10-CM

## 2018-11-21 DIAGNOSIS — C3491 Malignant neoplasm of unspecified part of right bronchus or lung: Secondary | ICD-10-CM

## 2018-11-21 DIAGNOSIS — R590 Localized enlarged lymph nodes: Secondary | ICD-10-CM

## 2018-11-21 LAB — CMP (CANCER CENTER ONLY)
ALT: 17 U/L (ref 0–44)
AST: 15 U/L (ref 15–41)
Albumin: 3.8 g/dL (ref 3.5–5.0)
Alkaline Phosphatase: 93 U/L (ref 38–126)
Anion gap: 8 (ref 5–15)
BUN: 21 mg/dL (ref 8–23)
CO2: 27 mmol/L (ref 22–32)
Calcium: 9.3 mg/dL (ref 8.9–10.3)
Chloride: 104 mmol/L (ref 98–111)
Creatinine: 1.33 mg/dL — ABNORMAL HIGH (ref 0.61–1.24)
GFR, Est AFR Am: 59 mL/min — ABNORMAL LOW (ref >60)
GFR, Estimated: 51 mL/min — ABNORMAL LOW (ref >60)
Glucose, Bld: 113 mg/dL — ABNORMAL HIGH (ref 70–99)
Potassium: 4.1 mmol/L (ref 3.5–5.1)
Sodium: 139 mmol/L (ref 135–145)
Total Bilirubin: 0.5 mg/dL (ref 0.3–1.2)
Total Protein: 6.6 g/dL (ref 6.5–8.1)

## 2018-11-21 LAB — CBC WITH DIFFERENTIAL (CANCER CENTER ONLY)
Abs Immature Granulocytes: 0.05 10*3/uL (ref 0.00–0.07)
Basophils Absolute: 0 10*3/uL (ref 0.0–0.1)
Basophils Relative: 0 %
Eosinophils Absolute: 0.1 10*3/uL (ref 0.0–0.5)
Eosinophils Relative: 1 %
HCT: 33.9 % — ABNORMAL LOW (ref 39.0–52.0)
HEMOGLOBIN: 10.6 g/dL — AB (ref 13.0–17.0)
Immature Granulocytes: 1 %
LYMPHS PCT: 11 %
Lymphs Abs: 1.1 10*3/uL (ref 0.7–4.0)
MCH: 30.1 pg (ref 26.0–34.0)
MCHC: 31.3 g/dL (ref 30.0–36.0)
MCV: 96.3 fL (ref 80.0–100.0)
Monocytes Absolute: 0.8 10*3/uL (ref 0.1–1.0)
Monocytes Relative: 8 %
Neutro Abs: 8.4 10*3/uL — ABNORMAL HIGH (ref 1.7–7.7)
Neutrophils Relative %: 79 %
Platelet Count: 172 10*3/uL (ref 150–400)
RBC: 3.52 MIL/uL — ABNORMAL LOW (ref 4.22–5.81)
RDW: 18 % — ABNORMAL HIGH (ref 11.5–15.5)
WBC Count: 10.5 10*3/uL (ref 4.0–10.5)
nRBC: 0 % (ref 0.0–0.2)

## 2018-11-21 MED ORDER — SODIUM CHLORIDE 0.9 % IV SOLN
Freq: Once | INTRAVENOUS | Status: AC
  Administered 2018-11-21: 13:00:00 via INTRAVENOUS
  Filled 2018-11-21: qty 250

## 2018-11-21 MED ORDER — SODIUM CHLORIDE 0.9 % IV SOLN
1200.0000 mg | Freq: Once | INTRAVENOUS | Status: AC
  Administered 2018-11-21: 1200 mg via INTRAVENOUS
  Filled 2018-11-21: qty 20

## 2018-11-21 NOTE — Patient Instructions (Addendum)
[facility] Discharge Instructions for Patients Receiving Chemotherapy  Today you received the following chemotherapy agents: Atezolizumab (Tecentriq).  To help prevent nausea and vomiting after your treatment, we encourage you to take your nausea medication as directed. Received Aloxi during treatment today  If you develop nausea and vomiting that is not controlled by your nausea medication, call the clinic.   BELOW ARE SYMPTOMS THAT SHOULD BE REPORTED IMMEDIATELY:  *FEVER GREATER THAN 100.5 F  *CHILLS WITH OR WITHOUT FEVER  NAUSEA AND VOMITING THAT IS NOT CONTROLLED WITH YOUR NAUSEA MEDICATION  *UNUSUAL SHORTNESS OF BREATH  *UNUSUAL BRUISING OR BLEEDING  TENDERNESS IN MOUTH AND THROAT WITH OR WITHOUT PRESENCE OF ULCERS  *URINARY PROBLEMS  *BOWEL PROBLEMS  UNUSUAL RASH Items with * indicate a potential emergency and should be followed up as soon as possible.  Feel free to call the clinic should you have any questions or concerns. The clinic phone number is [phone].  Please show the [place] at check-in to the Emergency Department and triage nurse.

## 2018-11-21 NOTE — Progress Notes (Signed)
[facility] [phone]   Fax:([phone]) [place], MD [place] [address]  DIAGNOSIS: Extensive stage (T1 a, N2, M1a) extensive stage small cell lung cancer presented with right lung nodules in addition to mediastinal lymphadenopathy as well as pericardial metastasis diagnosed in October 2019  PRIOR THERAPY: None.  CURRENT THERAPY: palliative systemic chemotherapy with carboplatin for AUC of 5 on day 1, etoposide 100 mg/M2 on days 1, 2 and 3 in addition to Tecentriq (Atezolizumab) 1200 mg IV every 3 weeks.  Status post 4 cycles.  Starting from cycle #5 the patient will be treated with maintenance immunotherapy with Tecentriq 1200 mg IV every 3 weeks.  INTERVAL HISTORY: Gregory Bates 78 y.o. male returns to the clinic today for follow-up visit accompanied by his wife.  The patient is feeling fine today with no concerning complaints.  He denied having any chest pain but continues to have the baseline shortness of breath with mild cough and no hemoptysis.  He denied having any weight loss or night sweats.  He has no nausea, vomiting, diarrhea or constipation.  He tolerated the last cycle of his treatment well with no concerning adverse effects. The patient is here today for evaluation before starting the first dose of his maintenance treatment with Tecentriq.   MEDICAL HISTORY: Past Medical History:  Diagnosis Date  . Acute renal insufficiency    a. During 11/2013 adm with atrial flutter.  . Anemia, unspecified   . Atrial fibrillation ([place])   . CAD (coronary artery disease), native coronary artery    a. s/p cath May 2011 >> DES distal RCA;  b. 01/2013 NSTEMI >> OM1 99p (2.75x12 Promus Premier DES);  c. LHC (2/15): PCI: Promus DES to dist RCA ;  d. LHC 1/16 - dLM 49, oLAD 30, small D1 tandem 11, pOM1 stent ok, mOM 50-60, dOM bifurcation 50-60 in both vessels, AV 40-70 jailed by stent, pRCA 12 mRCA stent  ok, dRCA stent ok, PLB smal 50, dPDA 60, 80; EF 35-40 >> med Rx - PCI of PDA if angina  . Chronic combined systolic and diastolic CHF (congestive heart failure) ([place]) 12/23/2015   Echo 2/17: Mild LVH, EF 45-50%, inferior akinesis, grade 2 diastolic dysfunction, mild AI, mild MR, mild LAE, PASP 44 mmHg   . Colorectal polyps 2002  . Diabetes mellitus    AODM  . Erectile dysfunction   . History of echocardiogram    a. 01/2013 Echo: EF 55%, mid-dist inflat HK, Gr1 DD, Triv AI/TR, Mild MR.;  b.  Echo (1/15): Mild LVH, EF 55%, inferolateral hypokinesis, grade 1 diastolic dysfunction, mild AI, trivial MR, moderate LAE, PASP 36 mmHg   . Hx of cardiovascular stress test    Stress myoview 08/07/13 with LVEF 44%, inferior scar, no ischemia  . Hyperlipidemia   . Hypertension   . Hyponatremia   . Ischemic cardiomyopathy    a. EF 35-40% by LHC in 1/16; b. Echo 2/17: Mild LVH, EF 45-50%, inferior akinesis, grade 2 diastolic dysfunction, mild AI, mild MR, mild LAE, PASP 44 mmHg  . Myocardial infarction ([place])   . Oxygen deficiency   . PAF (paroxysmal atrial fibrillation) ([place])    a. Dx 11/2013. Spont conv. Placed on eliquis.  . Small cell lung cancer in adult [facility]) 07/28/2018    ALLERGIES:  is allergic to brilinta [ticagrelor] and crestor [rosuvastatin].  MEDICATIONS:  Current Outpatient Medications  Medication Sig Dispense  Refill  . acetaminophen (TYLENOL) 325 MG tablet Take 2 tablets (650 mg total) by mouth every 6 (six) hours as needed for mild pain.    [name] Kitchen albuterol (PROVENTIL HFA;VENTOLIN HFA) 108 (90 Base) MCG/ACT inhaler Inhale 2 puffs into the lungs every 6 (six) hours as needed for wheezing or shortness of breath. 1 Inhaler 2  . amiodarone (PACERONE) 200 MG tablet Take 1 tablet (200 mg total) by mouth 2 (two) times daily. For one week;then take 200 mg daily thereafter (Patient taking differently: Take 200 mg by mouth See admin instructions. Take 200mg  by mouth twice daily for one week;then take 200  mg daily thereafter) 90 tablet 3  . apixaban (ELIQUIS) 5 MG TABS tablet Take 1 tablet (5 mg total) by mouth 2 (two) times daily. 180 tablet 1  . aspirin EC 81 MG EC tablet Take 1 tablet (81 mg total) by mouth daily.    [name] Kitchen atorvastatin (LIPITOR) 20 MG tablet TAKE 1 TABLET EVERY DAY 90 tablet 3  . cholecalciferol (VITAMIN D) 1000 UNITS tablet Take 1,000 Units by mouth every evening.     . clopidogrel (PLAVIX) 75 MG tablet     . cyclobenzaprine (FLEXERIL) 5 MG tablet Take 1 tablet (5 mg total) by mouth 2 (two) times daily as needed for muscle spasms (cramping). 30 tablet 0  . fexofenadine (ALLEGRA) 180 MG tablet Take 180 mg by mouth daily as needed for allergies.     . fluticasone (FLONASE) 50 MCG/ACT nasal spray Place 1 spray into both nostrils daily as needed for allergies.     . furosemide (LASIX) 40 MG tablet Take 1 tablet (40 mg total) by mouth 2 (two) times daily. 60 tablet 3  . guaiFENesin (MUCINEX) 600 MG 12 hr tablet Take 1 tablet (600 mg total) by mouth 2 (two) times daily as needed for cough or to loosen phlegm.    . isosorbide mononitrate (IMDUR) 60 MG 24 hr tablet TAKE 1 TABLET EVERY DAY 90 tablet 2  . metoprolol tartrate (LOPRESSOR) 25 MG tablet Take 1 tablet (25 mg total) by mouth 2 (two) times daily. 180 tablet 3  . Multiple Vitamin (MULTIVITAMIN) tablet Take 1 tablet by mouth at bedtime.     . ondansetron (ZOFRAN ODT) 4 MG disintegrating tablet Take 1 tablet (4 mg total) by mouth every 8 (eight) hours as needed for nausea or vomiting. 20 tablet 0  . prochlorperazine (COMPAZINE) 10 MG tablet Take 1 tablet (10 mg total) by mouth every 6 (six) hours as needed for nausea or vomiting. 30 tablet 0  . vitamin E 100 UNIT capsule Take 100 Units by mouth at bedtime.      No current facility-administered medications for this visit.     SURGICAL HISTORY:  Past Surgical History:  Procedure Laterality Date  . BIOPSY  08/01/2018   Procedure: BIOPSY OF PERICARDIAL LYMPH NODE AND HILAR LYMPH  NODE;  Surgeon: Grace Isaac, MD;  Location: [facility] [place];  Service: Open Heart Surgery;;  . CLIPPING OF ATRIAL APPENDAGE Left 08/01/2018   Procedure: CLIPPING OF ATRIAL APPENDAGE;  Surgeon: Grace Isaac, MD;  Location: [facility];  Service: Open Heart Surgery;  Laterality: Left;  . COLONOSCOPY W/ POLYPECTOMY  2002   [facility]  . COLONOSCOPY W/ POLYPECTOMY  2004; 2007 negative  . CORONARY ANGIOPLASTY  12/26/2013  . CORONARY ARTERY BYPASS GRAFT N/A 08/01/2018   Procedure: CORONARY ARTERY BYPASS GRAFTING (CABG) TIMES THREE USING LEFT INTERNAL MAMMARY ARTERY TO LAD, RIGHT GREATER SAPHENOUS VEIN GRAFT  TO OM AND RCA, VEIN HARVESTED ENDOSCOPICALLY;  Surgeon: Grace Isaac, MD;  Location: [facility];  Service: Open Heart Surgery;  Laterality: N/A;  . CORONARY STENT PLACEMENT  2007, 2011  . infantile paralysis facial asymmetry    . LEFT HEART CATH AND CORONARY ANGIOGRAPHY N/A 05/18/2017   Procedure: Left Heart Cath and Coronary Angiography;  Surgeon: Martinique, Peter M, MD;  Location: [facility];  Service: Cardiovascular;  Laterality: N/A;  . LEFT HEART CATH AND CORONARY ANGIOGRAPHY N/A 07/27/2018   Procedure: LEFT HEART CATH AND CORONARY ANGIOGRAPHY;  Surgeon: Martinique, Peter M, MD;  Location: [place] [facility];  Service: Cardiovascular;  Laterality: N/A;  . LEFT HEART CATHETERIZATION WITH CORONARY ANGIOGRAM N/A 10/02/2012   Procedure: LEFT HEART CATHETERIZATION WITH CORONARY ANGIOGRAM;  Surgeon: Peter M Martinique, MD;  Location: [facility] CATH LAB;  Service: Cardiovascular;  Laterality: N/A;  . LEFT HEART CATHETERIZATION WITH CORONARY ANGIOGRAM N/A 02/20/2013   Procedure: LEFT HEART CATHETERIZATION WITH CORONARY ANGIOGRAM;  Surgeon: Burnell Blanks, MD;  Location: [facility] CATH LAB;  Service: Cardiovascular;  Laterality: N/A;  . LEFT HEART CATHETERIZATION WITH CORONARY ANGIOGRAM N/A 12/26/2013   Procedure: LEFT HEART CATHETERIZATION WITH CORONARY ANGIOGRAM;  Surgeon: Burnell Blanks, MD;  Location: [facility] CATH  LAB;  Service: Cardiovascular;  Laterality: N/A;  . LEFT HEART CATHETERIZATION WITH CORONARY ANGIOGRAM N/A 11/28/2014   Procedure: LEFT HEART CATHETERIZATION WITH CORONARY ANGIOGRAM;  Surgeon: Burnell Blanks, MD;  Location: [facility] CATH LAB;  Service: Cardiovascular;  Laterality: N/A;  . PERCUTANEOUS CORONARY STENT INTERVENTION (PCI-S)  10/02/2012   Procedure: PERCUTANEOUS CORONARY STENT INTERVENTION (PCI-S);  Surgeon: Peter M Martinique, MD;  Location: [facility] CATH LAB;  Service: Cardiovascular;;  . PERCUTANEOUS CORONARY STENT INTERVENTION (PCI-S)  02/20/2013   Procedure: PERCUTANEOUS CORONARY STENT INTERVENTION (PCI-S);  Surgeon: Burnell Blanks, MD;  Location: [facility] CATH LAB;  Service: Cardiovascular;;  . PERCUTANEOUS CORONARY STENT INTERVENTION (PCI-S)  12/26/2013   Procedure: PERCUTANEOUS CORONARY STENT INTERVENTION (PCI-S);  Surgeon: Burnell Blanks, MD;  Location: [facility] CATH LAB;  Service: Cardiovascular;;  . TEE WITHOUT CARDIOVERSION N/A 08/01/2018   Procedure: TRANSESOPHAGEAL ECHOCARDIOGRAM (TEE);  Surgeon: Grace Isaac, MD;  Location: [facility];  Service: Open Heart Surgery;  Laterality: N/A;  . WISDOM TOOTH EXTRACTION      REVIEW OF SYSTEMS:  A comprehensive review of systems was negative except for: Constitutional: positive for fatigue Respiratory: positive for dyspnea on exertion   PHYSICAL EXAMINATION: General appearance: alert, cooperative, fatigued and no distress Head: Normocephalic, without obvious abnormality, atraumatic Neck: no adenopathy, no JVD, supple, symmetrical, trachea midline and thyroid not enlarged, symmetric, no tenderness/mass/nodules Lymph nodes: Cervical, supraclavicular, and axillary nodes normal. Resp: clear to auscultation bilaterally Back: symmetric, no curvature. ROM normal. No CVA tenderness. Cardio: regular rate and rhythm, S1, S2 normal, no murmur, click, rub or gallop GI: soft, non-tender; bowel sounds normal; no masses,  no  organomegaly Extremities: extremities normal, atraumatic, no cyanosis or edema  ECOG PERFORMANCE STATUS: 1 - Symptomatic but completely ambulatory  Blood pressure (!) 120/56, pulse 78, temperature 98.2 F (36.8 C), temperature source Oral, resp. rate 18, height 5\' 11"  (1.803 m), weight 174 lb 1.6 oz (79 kg), SpO2 100 %.  LABORATORY DATA: Lab Results  Component Value Date   WBC 10.5 11/21/2018   HGB 10.6 (L) 11/21/2018   HCT 33.9 (L) 11/21/2018   MCV 96.3 11/21/2018   PLT 172 11/21/2018      Chemistry      Component Value Date/Time  NA 141 11/07/2018 1106   NA 126 (L) 08/24/2018 1237   K 4.1 11/07/2018 1106   CL 105 11/07/2018 1106   CO2 28 11/07/2018 1106   BUN 25 (H) 11/07/2018 1106   BUN 19 08/24/2018 1237   CREATININE 1.34 (H) 11/07/2018 1106   CREATININE 1.67 (H) 10/30/2016 1033      Component Value Date/Time   CALCIUM 8.5 (L) 11/07/2018 1106   ALKPHOS 103 11/07/2018 1106   AST 14 (L) 11/07/2018 1106   ALT 16 11/07/2018 1106   BILITOT 0.4 11/07/2018 1106       RADIOGRAPHIC STUDIES: Ct Chest W Contrast  Result Date: 10/27/2018 CLINICAL DATA:  Cancer with ongoing chemotherapy. EXAM: CT CHEST, ABDOMEN, AND PELVIS WITH CONTRAST TECHNIQUE: Multidetector CT imaging of the chest, abdomen and pelvis was performed following the standard protocol during bolus administration of intravenous contrast. CONTRAST:  18mL OMNIPAQUE IOHEXOL 300 MG/ML  SOLN COMPARISON:  PET 09/09/2018, CT chest 08/13/2018 and CT abdomen pelvis 10/16/2017. FINDINGS: CT CHEST FINDINGS Cardiovascular: Atherosclerotic calcification of the aorta. Heart is enlarged. No pericardial effusion. Mediastinum/Nodes: Continued improvement in mediastinal adenopathy. Residual low right paratracheal nodal lesion is now somewhat ill-defined, measuring approximately 1.6 x 2.1 cm, compared to 2.5 x 2.9 cm on 09/09/2018. No hilar or axillary adenopathy. Esophagus is grossly unremarkable. Lungs/Pleura: Areas of volume loss  and amorphous ground-glass are again seen in the right upper lobe. Moderate right pleural effusion, slightly decreased. Left pleural effusion is new 09/09/2018. Centrilobular and paraseptal emphysema. Mild volume loss in the lingula. Adherent debris in the distal bronchus intermedius. Airway is otherwise unremarkable. Musculoskeletal: Degenerative changes in the spine. No worrisome lytic or sclerotic lesions. Probable scattered spinal hemangiomas. CT ABDOMEN PELVIS FINDINGS Hepatobiliary: Liver and gallbladder are unremarkable. No biliary ductal dilatation. Pancreas: Negative. Spleen: Negative. Adrenals/Urinary Tract: Adrenal glands are unremarkable. Previously seen hypermetabolic lesion off the anterior lower pole right kidney has nearly completely resolved with only a small area of residual soft tissue thickening (series 2, image 76). 3.2 cm low-attenuation lesion in the interpolar right kidney is likely a cyst. Subcentimeter exophytic low-attenuation lesion off the interpolar left kidney is too small to characterize. Ureters are decompressed. Bladder is grossly unremarkable. Stomach/Bowel: Small hiatal hernia. Stomach is decompressed. Small bowel, appendix and colon are unremarkable. Vascular/Lymphatic: Atherosclerotic calcification of the aorta. Infrarenal aorta measures up to 3.5 cm (sagittal series 6, image 106). Portacaval lymph node measures 9 mm (series 2, image 63), previously 1.3 cm. No pathologically enlarged lymph nodes. Reproductive: Prostate is visualized. Other: Small bilateral inguinal hernias contain fat. No free fluid. Mesenteries and peritoneum are unremarkable. Musculoskeletal: Degenerative changes in the spine. No lytic or sclerotic lesions. Slight retrolisthesis of L5 on S1. IMPRESSION: 1. Continue response to therapy as evidenced by decreasing mediastinal adenopathy, smaller portacaval lymph node and near complete resolution of a right perinephric metastasis. Hypermetabolic osseous metastatic  disease seen on 09/09/2018 remains occult on CT imaging. 2. Areas of ill-defined nodularity/ground-glass in the right upper lobe, similar to 09/09/2018. Continued attention on follow-up exams is warranted. 3. Moderate right pleural effusion, decreased. New small left pleural effusion. 4. Infrarenal aortic aneurysm. Recommend followup by ultrasound in 2 years. This recommendation follows ACR consensus guidelines: White Paper of the ACR Incidental Findings Committee II on Vascular Findings. J Am Coll Radiol 2013; 10:789-794. 5.  Aortic atherosclerosis (ICD10-170.0). 6.  Emphysema (ICD10-J43.9). Electronically Signed   By: Lorin Picket M.D.   On: 10/27/2018 13:34   Ct Abdomen Pelvis W Contrast  Result  Date: 10/27/2018 CLINICAL DATA:  Cancer with ongoing chemotherapy. EXAM: CT CHEST, ABDOMEN, AND PELVIS WITH CONTRAST TECHNIQUE: Multidetector CT imaging of the chest, abdomen and pelvis was performed following the standard protocol during bolus administration of intravenous contrast. CONTRAST:  151mL OMNIPAQUE IOHEXOL 300 MG/ML  SOLN COMPARISON:  PET 09/09/2018, CT chest 08/13/2018 and CT abdomen pelvis 10/16/2017. FINDINGS: CT CHEST FINDINGS Cardiovascular: Atherosclerotic calcification of the aorta. Heart is enlarged. No pericardial effusion. Mediastinum/Nodes: Continued improvement in mediastinal adenopathy. Residual low right paratracheal nodal lesion is now somewhat ill-defined, measuring approximately 1.6 x 2.1 cm, compared to 2.5 x 2.9 cm on 09/09/2018. No hilar or axillary adenopathy. Esophagus is grossly unremarkable. Lungs/Pleura: Areas of volume loss and amorphous ground-glass are again seen in the right upper lobe. Moderate right pleural effusion, slightly decreased. Left pleural effusion is new 09/09/2018. Centrilobular and paraseptal emphysema. Mild volume loss in the lingula. Adherent debris in the distal bronchus intermedius. Airway is otherwise unremarkable. Musculoskeletal: Degenerative changes  in the spine. No worrisome lytic or sclerotic lesions. Probable scattered spinal hemangiomas. CT ABDOMEN PELVIS FINDINGS Hepatobiliary: Liver and gallbladder are unremarkable. No biliary ductal dilatation. Pancreas: Negative. Spleen: Negative. Adrenals/Urinary Tract: Adrenal glands are unremarkable. Previously seen hypermetabolic lesion off the anterior lower pole right kidney has nearly completely resolved with only a small area of residual soft tissue thickening (series 2, image 76). 3.2 cm low-attenuation lesion in the interpolar right kidney is likely a cyst. Subcentimeter exophytic low-attenuation lesion off the interpolar left kidney is too small to characterize. Ureters are decompressed. Bladder is grossly unremarkable. Stomach/Bowel: Small hiatal hernia. Stomach is decompressed. Small bowel, appendix and colon are unremarkable. Vascular/Lymphatic: Atherosclerotic calcification of the aorta. Infrarenal aorta measures up to 3.5 cm (sagittal series 6, image 106). Portacaval lymph node measures 9 mm (series 2, image 63), previously 1.3 cm. No pathologically enlarged lymph nodes. Reproductive: Prostate is visualized. Other: Small bilateral inguinal hernias contain fat. No free fluid. Mesenteries and peritoneum are unremarkable. Musculoskeletal: Degenerative changes in the spine. No lytic or sclerotic lesions. Slight retrolisthesis of L5 on S1. IMPRESSION: 1. Continue response to therapy as evidenced by decreasing mediastinal adenopathy, smaller portacaval lymph node and near complete resolution of a right perinephric metastasis. Hypermetabolic osseous metastatic disease seen on 09/09/2018 remains occult on CT imaging. 2. Areas of ill-defined nodularity/ground-glass in the right upper lobe, similar to 09/09/2018. Continued attention on follow-up exams is warranted. 3. Moderate right pleural effusion, decreased. New small left pleural effusion. 4. Infrarenal aortic aneurysm. Recommend followup by ultrasound in 2  years. This recommendation follows ACR consensus guidelines: White Paper of the ACR Incidental Findings Committee II on Vascular Findings. J Am Coll Radiol 2013; 10:789-794. 5.  Aortic atherosclerosis (ICD10-170.0). 6.  Emphysema (ICD10-J43.9). Electronically Signed   By: Lorin Picket M.D.   On: 10/27/2018 13:34    ASSESSMENT AND PLAN: This is a very pleasant 78 years old white male recently diagnosed with extensive stage small cell lung cancer.  The patient is here today for evaluation before starting the first cycle of his systemic chemotherapy with carboplatin, etoposide and Tecentriq.  Status post 4 cycles. He has been tolerating this treatment well with no concerning adverse effects and he has partial response after cycle #3. I recommended for the patient to start maintenance treatment with Tecentriq 1200 mg IV every 3 weeks and he will start the first dose of this treatment today. I will see him back for follow-up visit in 3 weeks for evaluation before starting cycle #2 of his  treatment. The patient was advised to call immediately if he has any concerning symptoms in the interval. The patient voices understanding of current disease status and treatment options and is in agreement with the current care plan. All questions were answered. The patient knows to call the clinic with any problems, questions or concerns. We can certainly see the patient much sooner if necessary.  I spent 10 minutes counseling the patient face to face. The total time spent in the appointment was 15 minutes.  Disclaimer: This note was dictated with voice recognition software. Similar sounding words can inadvertently be transcribed and may not be corrected upon review.

## 2018-11-21 NOTE — Telephone Encounter (Signed)
Spoke with the pt  He wants order sent to another DME that does have POC's  New order placed for [facility]

## 2018-11-22 ENCOUNTER — Inpatient Hospital Stay: Payer: Medicare HMO

## 2018-11-22 ENCOUNTER — Telehealth: Payer: Self-pay | Admitting: Medical Oncology

## 2018-11-22 NOTE — Telephone Encounter (Signed)
Requests schedule mailed to his street address.

## 2018-11-23 ENCOUNTER — Inpatient Hospital Stay: Payer: Medicare HMO

## 2018-11-23 DIAGNOSIS — J9611 Chronic respiratory failure with hypoxia: Secondary | ICD-10-CM | POA: Diagnosis not present

## 2018-11-25 ENCOUNTER — Inpatient Hospital Stay: Payer: Medicare HMO

## 2018-11-28 ENCOUNTER — Inpatient Hospital Stay: Payer: Medicare HMO

## 2018-12-05 ENCOUNTER — Other Ambulatory Visit: Payer: BLUE CROSS/BLUE SHIELD

## 2018-12-05 ENCOUNTER — Telehealth: Payer: Self-pay | Admitting: *Deleted

## 2018-12-05 DIAGNOSIS — I10 Essential (primary) hypertension: Secondary | ICD-10-CM | POA: Diagnosis not present

## 2018-12-05 NOTE — Telephone Encounter (Signed)
Received VM message from patient stating that he is having trouble walking and is having vision problems. TCT patient through home # as listed with wife.  Spoke with patient. He states his symptoms started yesterday with feeling light headed, feeling wobbly on his feet and his vision is hazy.  Denies new SOB, chest pain or fever. He is on heart and BP meds as well as blood thinner.  Advised pt to go to local eD/Urgent care today.  He states there is an urgent care close by.  Advised him to go there now-wife will drive.  Also asked him to call or have wife call tomorrow to let us know how he is. Pt states he has an appt next week with Dr. Julien Nordmann. Advised pt that he cannot wait that long to be evaluated. He states he understands and will go to Urgent care this afternoon.

## 2018-12-06 ENCOUNTER — Telehealth: Payer: Self-pay | Admitting: Medical Oncology

## 2018-12-06 NOTE — Telephone Encounter (Signed)
BP is 126/57. Pt called with his BP.

## 2018-12-12 ENCOUNTER — Inpatient Hospital Stay: Payer: Medicare HMO

## 2018-12-12 ENCOUNTER — Other Ambulatory Visit: Payer: Self-pay | Admitting: *Deleted

## 2018-12-12 ENCOUNTER — Inpatient Hospital Stay (HOSPITAL_BASED_OUTPATIENT_CLINIC_OR_DEPARTMENT_OTHER): Payer: Medicare HMO | Admitting: Internal Medicine

## 2018-12-12 ENCOUNTER — Encounter: Payer: Self-pay | Admitting: Internal Medicine

## 2018-12-12 ENCOUNTER — Inpatient Hospital Stay: Payer: Medicare HMO | Attending: Internal Medicine

## 2018-12-12 VITALS — BP 106/53 | HR 68 | Resp 18 | Ht 71.0 in | Wt 177.4 lb

## 2018-12-12 DIAGNOSIS — E785 Hyperlipidemia, unspecified: Secondary | ICD-10-CM | POA: Diagnosis not present

## 2018-12-12 DIAGNOSIS — I252 Old myocardial infarction: Secondary | ICD-10-CM | POA: Diagnosis not present

## 2018-12-12 DIAGNOSIS — I4891 Unspecified atrial fibrillation: Secondary | ICD-10-CM | POA: Insufficient documentation

## 2018-12-12 DIAGNOSIS — I110 Hypertensive heart disease with heart failure: Secondary | ICD-10-CM | POA: Insufficient documentation

## 2018-12-12 DIAGNOSIS — Z7901 Long term (current) use of anticoagulants: Secondary | ICD-10-CM | POA: Insufficient documentation

## 2018-12-12 DIAGNOSIS — D649 Anemia, unspecified: Secondary | ICD-10-CM | POA: Insufficient documentation

## 2018-12-12 DIAGNOSIS — I480 Paroxysmal atrial fibrillation: Secondary | ICD-10-CM | POA: Insufficient documentation

## 2018-12-12 DIAGNOSIS — C7989 Secondary malignant neoplasm of other specified sites: Secondary | ICD-10-CM

## 2018-12-12 DIAGNOSIS — C3490 Malignant neoplasm of unspecified part of unspecified bronchus or lung: Secondary | ICD-10-CM

## 2018-12-12 DIAGNOSIS — C3411 Malignant neoplasm of upper lobe, right bronchus or lung: Secondary | ICD-10-CM | POA: Diagnosis not present

## 2018-12-12 DIAGNOSIS — Z79899 Other long term (current) drug therapy: Secondary | ICD-10-CM

## 2018-12-12 DIAGNOSIS — Z5112 Encounter for antineoplastic immunotherapy: Secondary | ICD-10-CM

## 2018-12-12 DIAGNOSIS — C771 Secondary and unspecified malignant neoplasm of intrathoracic lymph nodes: Secondary | ICD-10-CM

## 2018-12-12 DIAGNOSIS — I255 Ischemic cardiomyopathy: Secondary | ICD-10-CM | POA: Diagnosis not present

## 2018-12-12 DIAGNOSIS — E119 Type 2 diabetes mellitus without complications: Secondary | ICD-10-CM

## 2018-12-12 DIAGNOSIS — R5382 Chronic fatigue, unspecified: Secondary | ICD-10-CM

## 2018-12-12 DIAGNOSIS — C3491 Malignant neoplasm of unspecified part of right bronchus or lung: Secondary | ICD-10-CM

## 2018-12-12 DIAGNOSIS — Z9221 Personal history of antineoplastic chemotherapy: Secondary | ICD-10-CM

## 2018-12-12 LAB — CBC WITH DIFFERENTIAL (CANCER CENTER ONLY)
Abs Immature Granulocytes: 0.03 10*3/uL (ref 0.00–0.07)
Basophils Absolute: 0.1 10*3/uL (ref 0.0–0.1)
Basophils Relative: 1 %
Eosinophils Absolute: 0.7 10*3/uL — ABNORMAL HIGH (ref 0.0–0.5)
Eosinophils Relative: 7 %
HCT: 33 % — ABNORMAL LOW (ref 39.0–52.0)
Hemoglobin: 10.7 g/dL — ABNORMAL LOW (ref 13.0–17.0)
Immature Granulocytes: 0 %
Lymphocytes Relative: 11 %
Lymphs Abs: 1.1 10*3/uL (ref 0.7–4.0)
MCH: 31.2 pg (ref 26.0–34.0)
MCHC: 32.4 g/dL (ref 30.0–36.0)
MCV: 96.2 fL (ref 80.0–100.0)
Monocytes Absolute: 1 10*3/uL (ref 0.1–1.0)
Monocytes Relative: 10 %
NEUTROS PCT: 71 %
Neutro Abs: 7.2 10*3/uL (ref 1.7–7.7)
Platelet Count: 199 10*3/uL (ref 150–400)
RBC: 3.43 MIL/uL — ABNORMAL LOW (ref 4.22–5.81)
RDW: 15.2 % (ref 11.5–15.5)
WBC Count: 10.2 10*3/uL (ref 4.0–10.5)
nRBC: 0 % (ref 0.0–0.2)

## 2018-12-12 LAB — CMP (CANCER CENTER ONLY)
ALT: 20 U/L (ref 0–44)
AST: 19 U/L (ref 15–41)
Albumin: 4 g/dL (ref 3.5–5.0)
Alkaline Phosphatase: 86 U/L (ref 38–126)
Anion gap: 10 (ref 5–15)
BILIRUBIN TOTAL: 0.4 mg/dL (ref 0.3–1.2)
BUN: 22 mg/dL (ref 8–23)
CO2: 26 mmol/L (ref 22–32)
Calcium: 9.6 mg/dL (ref 8.9–10.3)
Chloride: 104 mmol/L (ref 98–111)
Creatinine: 1.53 mg/dL — ABNORMAL HIGH (ref 0.61–1.24)
GFR, Est AFR Am: 50 mL/min — ABNORMAL LOW (ref >60)
GFR, Estimated: 43 mL/min — ABNORMAL LOW (ref >60)
Glucose, Bld: 107 mg/dL — ABNORMAL HIGH (ref 70–99)
POTASSIUM: 4.4 mmol/L (ref 3.5–5.1)
Sodium: 140 mmol/L (ref 135–145)
TOTAL PROTEIN: 7.2 g/dL (ref 6.5–8.1)

## 2018-12-12 LAB — TSH: TSH: 0.211 u[IU]/mL — ABNORMAL LOW (ref 0.320–4.118)

## 2018-12-12 MED ORDER — SODIUM CHLORIDE 0.9 % IV SOLN
Freq: Once | INTRAVENOUS | Status: DC
  Filled 2018-12-12: qty 250

## 2018-12-12 MED ORDER — SODIUM CHLORIDE 0.9 % IV SOLN
1200.0000 mg | Freq: Once | INTRAVENOUS | Status: AC
  Administered 2018-12-12: 1200 mg via INTRAVENOUS
  Filled 2018-12-12: qty 20

## 2018-12-12 NOTE — Progress Notes (Signed)
Okay to treat- Per Dr Julien Nordmann it is okay to treat pt with Tecentriq and todays cmp results.

## 2018-12-12 NOTE — Patient Instructions (Signed)
[facility] Discharge Instructions for Patients Receiving Chemotherapy  Today you received the following immunotherapy agents: Atezolizumab (Tecentriq).  To help prevent nausea and vomiting after your treatment, we encourage you to take your nausea medication as directed. Received Aloxi during treatment today  If you develop nausea and vomiting that is not controlled by your nausea medication, call the clinic.   BELOW ARE SYMPTOMS THAT SHOULD BE REPORTED IMMEDIATELY:  *FEVER GREATER THAN 100.5 F  *CHILLS WITH OR WITHOUT FEVER  NAUSEA AND VOMITING THAT IS NOT CONTROLLED WITH YOUR NAUSEA MEDICATION  *UNUSUAL SHORTNESS OF BREATH  *UNUSUAL BRUISING OR BLEEDING  TENDERNESS IN MOUTH AND THROAT WITH OR WITHOUT PRESENCE OF ULCERS  *URINARY PROBLEMS  *BOWEL PROBLEMS  UNUSUAL RASH Items with * indicate a potential emergency and should be followed up as soon as possible.  Feel free to call the clinic should you have any questions or concerns. The clinic phone number is [phone].  Please show the [place] at check-in to the Emergency Department and triage nurse.

## 2018-12-12 NOTE — Progress Notes (Signed)
[place] [phone]) [phone]   Fax:([phone]) [place], [place] [address]  DIAGNOSIS: Extensive stage (T1 a, N2, M1a) extensive stage small cell lung cancer presented with right lung nodules in addition to mediastinal lymphadenopathy as well as pericardial metastasis diagnosed in October 2019  PRIOR THERAPY: None.  CURRENT THERAPY: palliative systemic chemotherapy with carboplatin for AUC of 5 on day 1, etoposide 100 mg/M2 on days 1, 2 and 3 in addition to Tecentriq (Atezolizumab) 1200 mg IV every 3 weeks.  Status post 5 cycles.  Starting from cycle #5 the patient will be treated with maintenance immunotherapy with Tecentriq 1200 mg IV every 3 weeks.  INTERVAL HISTORY: Gregory Bates 78 y.o. male returns to the clinic today for follow-up visit accompanied by his wife.  The patient is feeling fine today with no concerning complaints except for the baseline shortness of breath but he is feeling much better.  He denied having any chest pain, cough or hemoptysis.  He denied having any fever or chills.  He has no nausea, vomiting, diarrhea or constipation.  He has no weight loss or night sweats.  He continues to tolerate his treatment fairly well.  The patient is here today for evaluation before starting cycle #6.   MEDICAL HISTORY: Past Medical History:  Diagnosis Date  . Acute renal insufficiency    a. During 11/2013 adm with atrial flutter.  . Anemia, unspecified   . Atrial fibrillation ([place])   . CAD (coronary artery disease), native coronary artery    a. s/p cath May 2011 >> DES distal RCA;  b. 01/2013 NSTEMI >> OM1 99p (2.75x12 Promus Premier DES);  c. LHC (2/15): PCI: Promus DES to dist RCA ;  d. LHC 1/16 - dLM 55, oLAD 30, small D1 tandem 69, pOM1 stent ok, mOM 50-60, dOM bifurcation 50-60 in both vessels, AV 95-70 jailed by stent, pRCA 19 mRCA stent ok, dRCA stent ok, PLB smal 50, dPDA 60, 80; EF  35-40 >> med Rx - PCI of PDA if angina  . Chronic combined systolic and diastolic CHF (congestive heart failure) ([place]) 12/23/2015   Echo 2/17: Mild LVH, EF 45-50%, inferior akinesis, grade 2 diastolic dysfunction, mild AI, mild MR, mild LAE, PASP 44 mmHg   . Colorectal polyps 2002  . Diabetes mellitus    AODM  . Erectile dysfunction   . History of echocardiogram    a. 01/2013 Echo: EF 55%, mid-dist inflat HK, Gr1 DD, Triv AI/TR, Mild MR.;  b.  Echo (1/15): Mild LVH, EF 55%, inferolateral hypokinesis, grade 1 diastolic dysfunction, mild AI, trivial MR, moderate LAE, PASP 36 mmHg   . Hx of cardiovascular stress test    Stress myoview 08/07/13 with LVEF 44%, inferior scar, no ischemia  . Hyperlipidemia   . Hypertension   . Hyponatremia   . Ischemic cardiomyopathy    a. EF 35-40% by LHC in 1/16; b. Echo 2/17: Mild LVH, EF 45-50%, inferior akinesis, grade 2 diastolic dysfunction, mild AI, mild MR, mild LAE, PASP 44 mmHg  . Myocardial infarction ([place])   . Oxygen deficiency   . PAF (paroxysmal atrial fibrillation) ([place])    a. Dx 11/2013. Spont conv. Placed on eliquis.  . Small cell lung cancer in adult [facility]) 07/28/2018    ALLERGIES:  is allergic to brilinta [ticagrelor] and crestor [rosuvastatin].  MEDICATIONS:  Current Outpatient Medications  Medication Sig Dispense Refill  . acetaminophen (  TYLENOL) 325 MG tablet Take 2 tablets (650 mg total) by mouth every 6 (six) hours as needed for mild pain. (Patient not taking: Reported on 11/21/2018)    . albuterol (PROVENTIL HFA;VENTOLIN HFA) 108 (90 Base) MCG/ACT inhaler Inhale 2 puffs into the lungs every 6 (six) hours as needed for wheezing or shortness of breath. (Patient not taking: Reported on 11/21/2018) 1 Inhaler 2  . amiodarone (PACERONE) 200 MG tablet Take 1 tablet (200 mg total) by mouth 2 (two) times daily. For one week;then take 200 mg daily thereafter (Patient taking differently: Take 200 mg by mouth See admin instructions. Take 200mg  by mouth  twice daily for one week;then take 200 mg daily thereafter) 90 tablet 3  . apixaban (ELIQUIS) 5 MG TABS tablet Take 1 tablet (5 mg total) by mouth 2 (two) times daily. 180 tablet 1  . aspirin EC 81 MG EC tablet Take 1 tablet (81 mg total) by mouth daily.    [name] Kitchen atorvastatin (LIPITOR) 20 MG tablet TAKE 1 TABLET EVERY DAY 90 tablet 3  . cholecalciferol (VITAMIN D) 1000 UNITS tablet Take 1,000 Units by mouth every evening.     . clopidogrel (PLAVIX) 75 MG tablet     . cyclobenzaprine (FLEXERIL) 5 MG tablet Take 1 tablet (5 mg total) by mouth 2 (two) times daily as needed for muscle spasms (cramping). (Patient not taking: Reported on 11/21/2018) 30 tablet 0  . fexofenadine (ALLEGRA) 180 MG tablet Take 180 mg by mouth daily as needed for allergies.     . fluticasone (FLONASE) 50 MCG/ACT nasal spray Place 1 spray into both nostrils daily as needed for allergies.     . furosemide (LASIX) 40 MG tablet Take 1 tablet (40 mg total) by mouth 2 (two) times daily. 60 tablet 3  . guaiFENesin (MUCINEX) 600 MG 12 hr tablet Take 1 tablet (600 mg total) by mouth 2 (two) times daily as needed for cough or to loosen phlegm.    . Iron Combinations (IRON COMPLEX PO) Take 1 tablet by mouth daily.    . isosorbide mononitrate (IMDUR) 60 MG 24 hr tablet TAKE 1 TABLET EVERY DAY 90 tablet 2  . metoprolol tartrate (LOPRESSOR) 25 MG tablet Take 1 tablet (25 mg total) by mouth 2 (two) times daily. 180 tablet 3  . Multiple Vitamin (MULTIVITAMIN) tablet Take 1 tablet by mouth at bedtime.     . ondansetron (ZOFRAN ODT) 4 MG disintegrating tablet Take 1 tablet (4 mg total) by mouth every 8 (eight) hours as needed for nausea or vomiting. (Patient not taking: Reported on 11/21/2018) 20 tablet 0  . prochlorperazine (COMPAZINE) 10 MG tablet Take 1 tablet (10 mg total) by mouth every 6 (six) hours as needed for nausea or vomiting. (Patient not taking: Reported on 11/21/2018) 30 tablet 0  . vitamin E 100 UNIT capsule Take 100 Units by mouth at  bedtime.      No current facility-administered medications for this visit.     SURGICAL HISTORY:  Past Surgical History:  Procedure Laterality Date  . BIOPSY  08/01/2018   Procedure: BIOPSY OF PERICARDIAL LYMPH NODE AND HILAR LYMPH NODE;  Surgeon: Grace Isaac, MD;  Location: [place];  Service: Open Heart Surgery;;  . CLIPPING OF ATRIAL APPENDAGE Left 08/01/2018   Procedure: CLIPPING OF ATRIAL APPENDAGE;  Surgeon: Grace Isaac, MD;  Location: [facility];  Service: Open Heart Surgery;  Laterality: Left;  . COLONOSCOPY W/ POLYPECTOMY  2002    GI  . COLONOSCOPY W/  POLYPECTOMY  2004; 2007 negative  . CORONARY ANGIOPLASTY  12/26/2013  . CORONARY ARTERY BYPASS GRAFT N/A 08/01/2018   Procedure: CORONARY ARTERY BYPASS GRAFTING (CABG) TIMES THREE USING LEFT INTERNAL MAMMARY ARTERY TO LAD, RIGHT GREATER SAPHENOUS VEIN GRAFT TO OM AND RCA, VEIN HARVESTED ENDOSCOPICALLY;  Surgeon: Grace Isaac, MD;  Location: [facility];  Service: Open Heart Surgery;  Laterality: N/A;  . CORONARY STENT PLACEMENT  2007, 2011  . infantile paralysis facial asymmetry    . LEFT HEART CATH AND CORONARY ANGIOGRAPHY N/A 05/18/2017   Procedure: Left Heart Cath and Coronary Angiography;  Surgeon: Martinique, Peter M, MD;  Location: [facility];  Service: Cardiovascular;  Laterality: N/A;  . LEFT HEART CATH AND CORONARY ANGIOGRAPHY N/A 07/27/2018   Procedure: LEFT HEART CATH AND CORONARY ANGIOGRAPHY;  Surgeon: Martinique, Peter M, MD;  Location: [place] [facility];  Service: Cardiovascular;  Laterality: N/A;  . LEFT HEART CATHETERIZATION WITH CORONARY ANGIOGRAM N/A 10/02/2012   Procedure: LEFT HEART CATHETERIZATION WITH CORONARY ANGIOGRAM;  Surgeon: Peter M Martinique, MD;  Location: [facility] CATH LAB;  Service: Cardiovascular;  Laterality: N/A;  . LEFT HEART CATHETERIZATION WITH CORONARY ANGIOGRAM N/A 02/20/2013   Procedure: LEFT HEART CATHETERIZATION WITH CORONARY ANGIOGRAM;  Surgeon: Burnell Blanks, MD;  Location: [facility] CATH  LAB;  Service: Cardiovascular;  Laterality: N/A;  . LEFT HEART CATHETERIZATION WITH CORONARY ANGIOGRAM N/A 12/26/2013   Procedure: LEFT HEART CATHETERIZATION WITH CORONARY ANGIOGRAM;  Surgeon: Burnell Blanks, MD;  Location: [facility] CATH LAB;  Service: Cardiovascular;  Laterality: N/A;  . LEFT HEART CATHETERIZATION WITH CORONARY ANGIOGRAM N/A 11/28/2014   Procedure: LEFT HEART CATHETERIZATION WITH CORONARY ANGIOGRAM;  Surgeon: Burnell Blanks, MD;  Location: [facility] CATH LAB;  Service: Cardiovascular;  Laterality: N/A;  . PERCUTANEOUS CORONARY STENT INTERVENTION (PCI-S)  10/02/2012   Procedure: PERCUTANEOUS CORONARY STENT INTERVENTION (PCI-S);  Surgeon: Peter M Martinique, MD;  Location: [facility] CATH LAB;  Service: Cardiovascular;;  . PERCUTANEOUS CORONARY STENT INTERVENTION (PCI-S)  02/20/2013   Procedure: PERCUTANEOUS CORONARY STENT INTERVENTION (PCI-S);  Surgeon: Burnell Blanks, MD;  Location: [facility] CATH LAB;  Service: Cardiovascular;;  . PERCUTANEOUS CORONARY STENT INTERVENTION (PCI-S)  12/26/2013   Procedure: PERCUTANEOUS CORONARY STENT INTERVENTION (PCI-S);  Surgeon: Burnell Blanks, MD;  Location: [facility] CATH LAB;  Service: Cardiovascular;;  . TEE WITHOUT CARDIOVERSION N/A 08/01/2018   Procedure: TRANSESOPHAGEAL ECHOCARDIOGRAM (TEE);  Surgeon: Grace Isaac, MD;  Location: [place];  Service: Open Heart Surgery;  Laterality: N/A;  . WISDOM TOOTH EXTRACTION      REVIEW OF SYSTEMS:  A comprehensive review of systems was negative except for: Constitutional: positive for fatigue Respiratory: positive for dyspnea on exertion   PHYSICAL EXAMINATION: General appearance: alert, cooperative, fatigued and no distress Head: Normocephalic, without obvious abnormality, atraumatic Neck: no adenopathy, no JVD, supple, symmetrical, trachea midline and thyroid not enlarged, symmetric, no tenderness/mass/nodules Lymph nodes: Cervical, supraclavicular, and axillary nodes normal. Resp: clear to auscultation  bilaterally Back: symmetric, no curvature. ROM normal. No CVA tenderness. Cardio: regular rate and rhythm, S1, S2 normal, no murmur, click, rub or gallop GI: soft, non-tender; bowel sounds normal; no masses,  no organomegaly Extremities: extremities normal, atraumatic, no cyanosis or edema  ECOG PERFORMANCE STATUS: 1 - Symptomatic but completely ambulatory  Blood pressure (!) 106/53, pulse 68, resp. rate 18, height 5\' 11"  (1.803 m), weight 177 lb 6.4 oz (80.5 kg), SpO2 100 %.  LABORATORY DATA: Lab Results  Component Value Date   WBC 10.5 11/21/2018   HGB  10.6 (L) 11/21/2018   HCT 33.9 (L) 11/21/2018   MCV 96.3 11/21/2018   PLT 172 11/21/2018      Chemistry      Component Value Date/Time   NA 139 11/21/2018 1223   NA 126 (L) 08/24/2018 1237   K 4.1 11/21/2018 1223   CL 104 11/21/2018 1223   CO2 27 11/21/2018 1223   BUN 21 11/21/2018 1223   BUN 19 08/24/2018 1237   CREATININE 1.33 (H) 11/21/2018 1223   CREATININE 1.67 (H) 10/30/2016 1033      Component Value Date/Time   CALCIUM 9.3 11/21/2018 1223   ALKPHOS 93 11/21/2018 1223   AST 15 11/21/2018 1223   ALT 17 11/21/2018 1223   BILITOT 0.5 11/21/2018 1223       RADIOGRAPHIC STUDIES: No results found.  ASSESSMENT AND PLAN: This is a very pleasant 78 years old white male recently diagnosed with extensive stage small cell lung cancer.  The patient is here today for evaluation before starting the first cycle of his systemic chemotherapy with carboplatin, etoposide and Tecentriq.  Status post 5 cycles. Starting from cycle #5 the patient is on treatment with single agent Tecentriq.  He tolerated the first dose of this treatment well. I recommended for him to proceed with cycle #6 today as scheduled. I will see him back for follow-up visit in 3 weeks for evaluation after repeating CT scan of the chest, abdomen and pelvis for restaging of his disease. The patient was advised to call immediately if he has any concerning symptoms  in the interval. The patient voices understanding of current disease status and treatment options and is in agreement with the current care plan. All questions were answered. The patient knows to call the clinic with any problems, questions or concerns. We can certainly see the patient much sooner if necessary.  I spent 10 minutes counseling the patient face to face. The total time spent in the appointment was 15 minutes.  Disclaimer: This note was dictated with voice recognition software. Similar sounding words can inadvertently be transcribed and may not be corrected upon review.

## 2018-12-19 ENCOUNTER — Ambulatory Visit: Payer: BLUE CROSS/BLUE SHIELD

## 2018-12-19 ENCOUNTER — Other Ambulatory Visit: Payer: BLUE CROSS/BLUE SHIELD

## 2018-12-19 ENCOUNTER — Ambulatory Visit: Payer: BLUE CROSS/BLUE SHIELD | Admitting: Internal Medicine

## 2018-12-21 ENCOUNTER — Other Ambulatory Visit: Payer: Self-pay | Admitting: Cardiovascular Disease

## 2018-12-24 DIAGNOSIS — J9611 Chronic respiratory failure with hypoxia: Secondary | ICD-10-CM | POA: Diagnosis not present

## 2018-12-26 ENCOUNTER — Telehealth: Payer: Self-pay | Admitting: Medical Oncology

## 2018-12-26 NOTE — Telephone Encounter (Signed)
Picking u barium- told pt to pick it up here or radiology.

## 2018-12-30 ENCOUNTER — Ambulatory Visit (HOSPITAL_COMMUNITY)
Admission: RE | Admit: 2018-12-30 | Discharge: 2018-12-30 | Disposition: A | Discharge status: Home / Self Care | Payer: Medicare HMO | Source: Ambulatory Visit | Attending: Internal Medicine | Admitting: Internal Medicine

## 2018-12-30 DIAGNOSIS — C3411 Malignant neoplasm of upper lobe, right bronchus or lung: Secondary | ICD-10-CM | POA: Diagnosis not present

## 2018-12-30 DIAGNOSIS — C3491 Malignant neoplasm of unspecified part of right bronchus or lung: Secondary | ICD-10-CM | POA: Diagnosis not present

## 2018-12-30 MED ORDER — SODIUM CHLORIDE (PF) 0.9 % IJ SOLN
INTRAMUSCULAR | Status: AC
  Filled 2018-12-30: qty 50

## 2018-12-30 MED ORDER — IOHEXOL 300 MG/ML  SOLN
100.0000 mL | Freq: Once | INTRAMUSCULAR | Status: AC | PRN
  Administered 2018-12-30: 80 mL via INTRAVENOUS

## 2019-01-02 ENCOUNTER — Inpatient Hospital Stay: Payer: Medicare HMO | Attending: Internal Medicine

## 2019-01-02 ENCOUNTER — Inpatient Hospital Stay: Payer: Medicare HMO

## 2019-01-02 ENCOUNTER — Encounter: Payer: Self-pay | Admitting: Internal Medicine

## 2019-01-02 ENCOUNTER — Inpatient Hospital Stay (HOSPITAL_BASED_OUTPATIENT_CLINIC_OR_DEPARTMENT_OTHER): Payer: Medicare HMO | Admitting: Internal Medicine

## 2019-01-02 VITALS — BP 114/55 | HR 70 | Temp 98.7°F | Resp 17 | Ht 71.0 in | Wt 181.5 lb

## 2019-01-02 DIAGNOSIS — Z5112 Encounter for antineoplastic immunotherapy: Secondary | ICD-10-CM

## 2019-01-02 DIAGNOSIS — I255 Ischemic cardiomyopathy: Secondary | ICD-10-CM | POA: Diagnosis not present

## 2019-01-02 DIAGNOSIS — C3490 Malignant neoplasm of unspecified part of unspecified bronchus or lung: Secondary | ICD-10-CM

## 2019-01-02 DIAGNOSIS — C782 Secondary malignant neoplasm of pleura: Secondary | ICD-10-CM

## 2019-01-02 DIAGNOSIS — I2510 Atherosclerotic heart disease of native coronary artery without angina pectoris: Secondary | ICD-10-CM

## 2019-01-02 DIAGNOSIS — I480 Paroxysmal atrial fibrillation: Secondary | ICD-10-CM

## 2019-01-02 DIAGNOSIS — E785 Hyperlipidemia, unspecified: Secondary | ICD-10-CM

## 2019-01-02 DIAGNOSIS — N281 Cyst of kidney, acquired: Secondary | ICD-10-CM

## 2019-01-02 DIAGNOSIS — K5730 Diverticulosis of large intestine without perforation or abscess without bleeding: Secondary | ICD-10-CM | POA: Diagnosis not present

## 2019-01-02 DIAGNOSIS — I252 Old myocardial infarction: Secondary | ICD-10-CM | POA: Diagnosis not present

## 2019-01-02 DIAGNOSIS — K4020 Bilateral inguinal hernia, without obstruction or gangrene, not specified as recurrent: Secondary | ICD-10-CM | POA: Insufficient documentation

## 2019-01-02 DIAGNOSIS — Z9221 Personal history of antineoplastic chemotherapy: Secondary | ICD-10-CM | POA: Insufficient documentation

## 2019-01-02 DIAGNOSIS — E119 Type 2 diabetes mellitus without complications: Secondary | ICD-10-CM | POA: Diagnosis not present

## 2019-01-02 DIAGNOSIS — D649 Anemia, unspecified: Secondary | ICD-10-CM | POA: Diagnosis not present

## 2019-01-02 DIAGNOSIS — I10 Essential (primary) hypertension: Secondary | ICD-10-CM

## 2019-01-02 DIAGNOSIS — Z7901 Long term (current) use of anticoagulants: Secondary | ICD-10-CM | POA: Insufficient documentation

## 2019-01-02 DIAGNOSIS — Z7982 Long term (current) use of aspirin: Secondary | ICD-10-CM | POA: Insufficient documentation

## 2019-01-02 DIAGNOSIS — C3411 Malignant neoplasm of upper lobe, right bronchus or lung: Secondary | ICD-10-CM

## 2019-01-02 DIAGNOSIS — Z79899 Other long term (current) drug therapy: Secondary | ICD-10-CM

## 2019-01-02 DIAGNOSIS — I714 Abdominal aortic aneurysm, without rupture: Secondary | ICD-10-CM

## 2019-01-02 DIAGNOSIS — C3491 Malignant neoplasm of unspecified part of right bronchus or lung: Secondary | ICD-10-CM

## 2019-01-02 DIAGNOSIS — R5382 Chronic fatigue, unspecified: Secondary | ICD-10-CM

## 2019-01-02 DIAGNOSIS — I110 Hypertensive heart disease with heart failure: Secondary | ICD-10-CM | POA: Insufficient documentation

## 2019-01-02 LAB — CBC WITH DIFFERENTIAL (CANCER CENTER ONLY)
Abs Immature Granulocytes: 0.01 10*3/uL (ref 0.00–0.07)
Basophils Absolute: 0.1 10*3/uL (ref 0.0–0.1)
Basophils Relative: 1 %
Eosinophils Absolute: 0.6 10*3/uL — ABNORMAL HIGH (ref 0.0–0.5)
Eosinophils Relative: 7 %
HCT: 33.7 % — ABNORMAL LOW (ref 39.0–52.0)
HEMOGLOBIN: 10.8 g/dL — AB (ref 13.0–17.0)
Immature Granulocytes: 0 %
LYMPHS PCT: 16 %
Lymphs Abs: 1.3 10*3/uL (ref 0.7–4.0)
MCH: 30.3 pg (ref 26.0–34.0)
MCHC: 32 g/dL (ref 30.0–36.0)
MCV: 94.7 fL (ref 80.0–100.0)
Monocytes Absolute: 0.8 10*3/uL (ref 0.1–1.0)
Monocytes Relative: 9 %
Neutro Abs: 5.8 10*3/uL (ref 1.7–7.7)
Neutrophils Relative %: 67 %
Platelet Count: 153 10*3/uL (ref 150–400)
RBC: 3.56 MIL/uL — ABNORMAL LOW (ref 4.22–5.81)
RDW: 13.4 % (ref 11.5–15.5)
WBC Count: 8.5 10*3/uL (ref 4.0–10.5)
nRBC: 0 % (ref 0.0–0.2)

## 2019-01-02 LAB — CMP (CANCER CENTER ONLY)
ALT: 15 U/L (ref 0–44)
AST: 16 U/L (ref 15–41)
Albumin: 3.9 g/dL (ref 3.5–5.0)
Alkaline Phosphatase: 90 U/L (ref 38–126)
Anion gap: 10 (ref 5–15)
BUN: 19 mg/dL (ref 8–23)
CALCIUM: 9.3 mg/dL (ref 8.9–10.3)
CO2: 25 mmol/L (ref 22–32)
Chloride: 104 mmol/L (ref 98–111)
Creatinine: 1.44 mg/dL — ABNORMAL HIGH (ref 0.61–1.24)
GFR, Est AFR Am: 54 mL/min — ABNORMAL LOW (ref >60)
GFR, Estimated: 46 mL/min — ABNORMAL LOW (ref >60)
GLUCOSE: 128 mg/dL — AB (ref 70–99)
Potassium: 4.2 mmol/L (ref 3.5–5.1)
Sodium: 139 mmol/L (ref 135–145)
Total Bilirubin: 0.4 mg/dL (ref 0.3–1.2)
Total Protein: 7.3 g/dL (ref 6.5–8.1)

## 2019-01-02 LAB — TSH: TSH: 0.246 u[IU]/mL — ABNORMAL LOW (ref 0.320–4.118)

## 2019-01-02 MED ORDER — SODIUM CHLORIDE 0.9 % IV SOLN
Freq: Once | INTRAVENOUS | Status: AC
  Administered 2019-01-02: 15:00:00 via INTRAVENOUS
  Filled 2019-01-02: qty 250

## 2019-01-02 MED ORDER — SODIUM CHLORIDE 0.9 % IV SOLN
1200.0000 mg | Freq: Once | INTRAVENOUS | Status: AC
  Administered 2019-01-02: 1200 mg via INTRAVENOUS
  Filled 2019-01-02: qty 20

## 2019-01-02 NOTE — Progress Notes (Signed)
[place] Telephone:([phone]) [phone]   Fax:(336) [place], MD [place] [address]  DIAGNOSIS: Extensive stage (T1 a, N2, M1a) extensive stage small cell lung cancer presented with right lung nodules in addition to mediastinal lymphadenopathy as well as pericardial metastasis diagnosed in October 2019  PRIOR THERAPY: None.  CURRENT THERAPY: palliative systemic chemotherapy with carboplatin for AUC of 5 on day 1, etoposide 100 mg/M2 on days 1, 2 and 3 in addition to Tecentriq (Atezolizumab) 1200 mg IV every 3 weeks.  Status post 8 cycles.  Starting from cycle #5 the patient will be treated with maintenance immunotherapy with Tecentriq 1200 mg IV every 3 weeks.  INTERVAL HISTORY: Gregory Bates 78 y.o. male returns to the clinic today for follow-up visit accompanied by his wife.  The patient is feeling very well these days and he has no concerning complaints.  He mentions that he is breathing much better than before.  He denied having any chest pain, cough or hemoptysis.  He has no recent weight loss or night sweats.  He has no nausea, vomiting, diarrhea or constipation.  He has no headache or visual changes.  He has been tolerating his treatment with maintenance Tecentriq fairly well.  He had repeat CT scan of the chest, abdomen and pelvis performed recently and he is here for evaluation and discussion of his scan results.  MEDICAL HISTORY: Past Medical History:  Diagnosis Date  . Acute renal insufficiency    a. During 11/2013 adm with atrial flutter.  . Anemia, unspecified   . Atrial fibrillation ([place])   . CAD (coronary artery disease), native coronary artery    a. s/p cath May 2011 >> DES distal RCA;  b. 01/2013 NSTEMI >> OM1 99p (2.75x12 Promus Premier DES);  c. LHC (2/15): PCI: Promus DES to dist RCA ;  d. LHC 1/16 - dLM 46, oLAD 30, small D1 tandem 46, pOM1 stent ok, mOM 50-60, dOM bifurcation 50-60 in both  vessels, AV 68-70 jailed by stent, pRCA 51 mRCA stent ok, dRCA stent ok, PLB smal 50, dPDA 60, 80; EF 35-40 >> med Rx - PCI of PDA if angina  . Chronic combined systolic and diastolic CHF (congestive heart failure) ([place]) 12/23/2015   Echo 2/17: Mild LVH, EF 45-50%, inferior akinesis, grade 2 diastolic dysfunction, mild AI, mild MR, mild LAE, PASP 44 mmHg   . Colorectal polyps 2002  . Diabetes mellitus    AODM  . Erectile dysfunction   . History of echocardiogram    a. 01/2013 Echo: EF 55%, mid-dist inflat HK, Gr1 DD, Triv AI/TR, Mild MR.;  b.  Echo (1/15): Mild LVH, EF 55%, inferolateral hypokinesis, grade 1 diastolic dysfunction, mild AI, trivial MR, moderate LAE, PASP 36 mmHg   . Hx of cardiovascular stress test    Stress myoview 08/07/13 with LVEF 44%, inferior scar, no ischemia  . Hyperlipidemia   . Hypertension   . Hyponatremia   . Ischemic cardiomyopathy    a. EF 35-40% by LHC in 1/16; b. Echo 2/17: Mild LVH, EF 45-50%, inferior akinesis, grade 2 diastolic dysfunction, mild AI, mild MR, mild LAE, PASP 44 mmHg  . Myocardial infarction ([place])   . Oxygen deficiency   . PAF (paroxysmal atrial fibrillation) ([place])    a. Dx 11/2013. Spont conv. Placed on eliquis.  . Small cell lung cancer in adult [facility]) 07/28/2018    ALLERGIES:  is allergic to  brilinta [ticagrelor] and crestor [rosuvastatin].  MEDICATIONS:  Current Outpatient Medications  Medication Sig Dispense Refill  . acetaminophen (TYLENOL) 325 MG tablet Take 2 tablets (650 mg total) by mouth every 6 (six) hours as needed for mild pain. (Patient not taking: Reported on 11/21/2018)    . albuterol (PROVENTIL HFA;VENTOLIN HFA) 108 (90 Base) MCG/ACT inhaler Inhale 2 puffs into the lungs every 6 (six) hours as needed for wheezing or shortness of breath. (Patient not taking: Reported on 11/21/2018) 1 Inhaler 2  . amiodarone (PACERONE) 200 MG tablet Take 1 tablet (200 mg total) by mouth 2 (two) times daily. For one week;then take 200 mg daily  thereafter (Patient taking differently: Take 200 mg by mouth See admin instructions. Take 200mg  by mouth twice daily for one week;then take 200 mg daily thereafter) 90 tablet 3  . apixaban (ELIQUIS) 5 MG TABS tablet Take 1 tablet (5 mg total) by mouth 2 (two) times daily. 180 tablet 1  . aspirin EC 81 MG EC tablet Take 1 tablet (81 mg total) by mouth daily.    [name] Kitchen atorvastatin (LIPITOR) 20 MG tablet TAKE 1 TABLET EVERY DAY 90 tablet 3  . cholecalciferol (VITAMIN D) 1000 UNITS tablet Take 1,000 Units by mouth every evening.     . clopidogrel (PLAVIX) 75 MG tablet     . cyclobenzaprine (FLEXERIL) 5 MG tablet Take 1 tablet (5 mg total) by mouth 2 (two) times daily as needed for muscle spasms (cramping). (Patient not taking: Reported on 11/21/2018) 30 tablet 0  . fexofenadine (ALLEGRA) 180 MG tablet Take 180 mg by mouth daily as needed for allergies.     . fluticasone (FLONASE) 50 MCG/ACT nasal spray Place 1 spray into both nostrils daily as needed for allergies.     . furosemide (LASIX) 40 MG tablet Take 1 tablet (40 mg total) by mouth 2 (two) times daily. 60 tablet 3  . guaiFENesin (MUCINEX) 600 MG 12 hr tablet Take 1 tablet (600 mg total) by mouth 2 (two) times daily as needed for cough or to loosen phlegm.    . Iron Combinations (IRON COMPLEX PO) Take 1 tablet by mouth daily.    . isosorbide mononitrate (IMDUR) 60 MG 24 hr tablet TAKE 1 TABLET EVERY DAY 90 tablet 2  . metoprolol tartrate (LOPRESSOR) 25 MG tablet TAKE 1 TABLET TWICE DAILY 180 tablet 2  . Multiple Vitamin (MULTIVITAMIN) tablet Take 1 tablet by mouth at bedtime.     . ondansetron (ZOFRAN ODT) 4 MG disintegrating tablet Take 1 tablet (4 mg total) by mouth every 8 (eight) hours as needed for nausea or vomiting. (Patient not taking: Reported on 11/21/2018) 20 tablet 0  . prochlorperazine (COMPAZINE) 10 MG tablet Take 1 tablet (10 mg total) by mouth every 6 (six) hours as needed for nausea or vomiting. (Patient not taking: Reported on  11/21/2018) 30 tablet 0  . vitamin E 100 UNIT capsule Take 100 Units by mouth at bedtime.      No current facility-administered medications for this visit.     SURGICAL HISTORY:  Past Surgical History:  Procedure Laterality Date  . BIOPSY  08/01/2018   Procedure: BIOPSY OF PERICARDIAL LYMPH NODE AND HILAR LYMPH NODE;  Surgeon: Grace Isaac, MD;  Location: [facility];  Service: Open Heart Surgery;;  . CLIPPING OF ATRIAL APPENDAGE Left 08/01/2018   Procedure: CLIPPING OF ATRIAL APPENDAGE;  Surgeon: Grace Isaac, MD;  Location: [facility];  Service: Open Heart Surgery;  Laterality: Left;  . COLONOSCOPY  W/ POLYPECTOMY  2002   [facility]  . COLONOSCOPY W/ POLYPECTOMY  2004; 2007 negative  . CORONARY ANGIOPLASTY  12/26/2013  . CORONARY ARTERY BYPASS GRAFT N/A 08/01/2018   Procedure: CORONARY ARTERY BYPASS GRAFTING (CABG) TIMES THREE USING LEFT INTERNAL MAMMARY ARTERY TO LAD, RIGHT GREATER SAPHENOUS VEIN GRAFT TO OM AND RCA, VEIN HARVESTED ENDOSCOPICALLY;  Surgeon: Grace Isaac, MD;  Location: [facility];  Service: Open Heart Surgery;  Laterality: N/A;  . CORONARY STENT PLACEMENT  2007, 2011  . infantile paralysis facial asymmetry    . LEFT HEART CATH AND CORONARY ANGIOGRAPHY N/A 05/18/2017   Procedure: Left Heart Cath and Coronary Angiography;  Surgeon: Martinique, Peter M, MD;  Location: [facility];  Service: Cardiovascular;  Laterality: N/A;  . LEFT HEART CATH AND CORONARY ANGIOGRAPHY N/A 07/27/2018   Procedure: LEFT HEART CATH AND CORONARY ANGIOGRAPHY;  Surgeon: Martinique, Peter M, MD;  Location: [facility];  Service: Cardiovascular;  Laterality: N/A;  . LEFT HEART CATHETERIZATION WITH CORONARY ANGIOGRAM N/A 10/02/2012   Procedure: LEFT HEART CATHETERIZATION WITH CORONARY ANGIOGRAM;  Surgeon: Peter M Martinique, MD;  Location: [facility] CATH LAB;  Service: Cardiovascular;  Laterality: N/A;  . LEFT HEART CATHETERIZATION WITH CORONARY ANGIOGRAM N/A 02/20/2013   Procedure: LEFT HEART CATHETERIZATION  WITH CORONARY ANGIOGRAM;  Surgeon: Burnell Blanks, MD;  Location: [facility] CATH LAB;  Service: Cardiovascular;  Laterality: N/A;  . LEFT HEART CATHETERIZATION WITH CORONARY ANGIOGRAM N/A 12/26/2013   Procedure: LEFT HEART CATHETERIZATION WITH CORONARY ANGIOGRAM;  Surgeon: Burnell Blanks, MD;  Location: [facility] CATH LAB;  Service: Cardiovascular;  Laterality: N/A;  . LEFT HEART CATHETERIZATION WITH CORONARY ANGIOGRAM N/A 11/28/2014   Procedure: LEFT HEART CATHETERIZATION WITH CORONARY ANGIOGRAM;  Surgeon: Burnell Blanks, MD;  Location: [facility] CATH LAB;  Service: Cardiovascular;  Laterality: N/A;  . PERCUTANEOUS CORONARY STENT INTERVENTION (PCI-S)  10/02/2012   Procedure: PERCUTANEOUS CORONARY STENT INTERVENTION (PCI-S);  Surgeon: Peter M Martinique, MD;  Location: [facility] CATH LAB;  Service: Cardiovascular;;  . PERCUTANEOUS CORONARY STENT INTERVENTION (PCI-S)  02/20/2013   Procedure: PERCUTANEOUS CORONARY STENT INTERVENTION (PCI-S);  Surgeon: Burnell Blanks, MD;  Location: [facility] CATH LAB;  Service: Cardiovascular;;  . PERCUTANEOUS CORONARY STENT INTERVENTION (PCI-S)  12/26/2013   Procedure: PERCUTANEOUS CORONARY STENT INTERVENTION (PCI-S);  Surgeon: Burnell Blanks, MD;  Location: [facility] CATH LAB;  Service: Cardiovascular;;  . TEE WITHOUT CARDIOVERSION N/A 08/01/2018   Procedure: TRANSESOPHAGEAL ECHOCARDIOGRAM (TEE);  Surgeon: Grace Isaac, MD;  Location: [place];  Service: Open Heart Surgery;  Laterality: N/A;  . WISDOM TOOTH EXTRACTION      REVIEW OF SYSTEMS:  Constitutional: negative Eyes: negative Ears, nose, mouth, throat, and face: negative Respiratory: positive for dyspnea on exertion Cardiovascular: negative Gastrointestinal: negative Genitourinary:negative Integument/breast: negative Hematologic/lymphatic: negative Musculoskeletal:negative Neurological: negative Behavioral/Psych: negative Endocrine: negative Allergic/Immunologic: negative   PHYSICAL EXAMINATION: General  appearance: alert, cooperative, fatigued and no distress Head: Normocephalic, without obvious abnormality, atraumatic Neck: no adenopathy, no JVD, supple, symmetrical, trachea midline and thyroid not enlarged, symmetric, no tenderness/mass/nodules Lymph nodes: Cervical, supraclavicular, and axillary nodes normal. Resp: clear to auscultation bilaterally Back: symmetric, no curvature. ROM normal. No CVA tenderness. Cardio: regular rate and rhythm, S1, S2 normal, no murmur, click, rub or gallop GI: soft, non-tender; bowel sounds normal; no masses,  no organomegaly Extremities: extremities normal, atraumatic, no cyanosis or edema Neurologic: Alert and oriented X 3, normal strength and tone. Normal symmetric reflexes. Normal coordination and gait  ECOG PERFORMANCE STATUS: 1 - Symptomatic  but completely ambulatory  Blood pressure (!) 114/55, pulse 70, temperature 98.7 F (37.1 C), temperature source Oral, resp. rate 17, height 5\' 11"  (1.803 m), weight 181 lb 8 oz (82.3 kg), SpO2 99 %.  LABORATORY DATA: Lab Results  Component Value Date   WBC 8.5 01/02/2019   HGB 10.8 (L) 01/02/2019   HCT 33.7 (L) 01/02/2019   MCV 94.7 01/02/2019   PLT 153 01/02/2019      Chemistry      Component Value Date/Time   NA 139 01/02/2019 1303   NA 126 (L) 08/24/2018 1237   K 4.2 01/02/2019 1303   CL 104 01/02/2019 1303   CO2 25 01/02/2019 1303   BUN 19 01/02/2019 1303   BUN 19 08/24/2018 1237   CREATININE 1.44 (H) 01/02/2019 1303   CREATININE 1.67 (H) 10/30/2016 1033      Component Value Date/Time   CALCIUM 9.3 01/02/2019 1303   ALKPHOS 90 01/02/2019 1303   AST 16 01/02/2019 1303   ALT 15 01/02/2019 1303   BILITOT 0.4 01/02/2019 1303       RADIOGRAPHIC STUDIES: Ct Chest W Contrast  Result Date: 12/30/2018 CLINICAL DATA:  Right lung cancer, chemotherapy complete EXAM: CT CHEST, ABDOMEN, AND PELVIS WITH CONTRAST TECHNIQUE: Multidetector CT imaging of the chest, abdomen and pelvis was performed  following the standard protocol during bolus administration of intravenous contrast. CONTRAST:  44mL OMNIPAQUE IOHEXOL 300 MG/ML  SOLN COMPARISON:  10/27/2018 FINDINGS: CT CHEST FINDINGS Cardiovascular: Heart is normal in size.  No pericardial effusion. No evidence of thoracic aortic aneurysm. Atherosclerotic calcifications of the aortic arch. Three vessel coronary atherosclerosis. Postsurgical changes related to prior CABG. Mediastinum/Nodes: No suspicious mediastinal lymphadenopathy. 7 mm short axis right hilar node, within normal limits. Mild residual soft tissue in the right paratracheal region without discrete adenopathy (series 2/image 23). Visualized thyroid is unremarkable. Lungs/Pleura: No suspicious pulmonary nodules. Mild centrilobular and paraseptal emphysematous changes, upper lobe predominant. Prior bilateral pleural effusions have resolved. No pneumothorax. Musculoskeletal: Degenerative changes of the thoracic spine. Median sternotomy. CT ABDOMEN PELVIS FINDINGS Hepatobiliary: Liver is within normal limits. Gallbladder is unremarkable. No intrahepatic or extrahepatic ductal dilatation. Pancreas: Within normal limits. Spleen: Within normal limits. Adrenals/Urinary Tract: Adrenal glands are within normal limits. 3.2 cm right lower pole renal cyst. Subcentimeter left renal cyst. No hydronephrosis. Bladder is thick-walled although underdistended. Stomach/Bowel: Stomach is within normal limits. No evidence of bowel obstruction. Normal appendix (series 2/image 94). Sigmoid diverticulosis, without evidence of diverticulitis. Vascular/Lymphatic: 3.7 x 3.3 cm infrarenal abdominal aortic aneurysm (series 2/image 81), grossly unchanged. Atherosclerotic calcifications of the abdominal aorta and branch vessels. No suspicious abdominopelvic lymphadenopathy. Reproductive: Prostate is grossly unremarkable. Other: No abdominopelvic ascites. Small fat containing bilateral inguinal hernias. Musculoskeletal: Mild  degenerative changes of the lumbar spine. IMPRESSION: No convincing evidence of recurrent or metastatic disease. Prior thoracic lymphadenopathy has essentially resolved. Prior pleural effusions have resolved. 3.7 x 3.3 cm infrarenal abdominal aortic aneurysm, grossly unchanged. Specific follow-up for this lesion is not required while this patient continues to undergo oncologic follow-up. Additional stable ancillary findings as above. Electronically Signed   By: Julian Hy M.D.   On: 12/30/2018 12:59   Ct Abdomen Pelvis W Contrast  Result Date: 12/30/2018 CLINICAL DATA:  Right lung cancer, chemotherapy complete EXAM: CT CHEST, ABDOMEN, AND PELVIS WITH CONTRAST TECHNIQUE: Multidetector CT imaging of the chest, abdomen and pelvis was performed following the standard protocol during bolus administration of intravenous contrast. CONTRAST:  22mL OMNIPAQUE IOHEXOL 300 MG/ML  SOLN COMPARISON:  10/27/2018 FINDINGS: CT CHEST FINDINGS Cardiovascular: Heart is normal in size.  No pericardial effusion. No evidence of thoracic aortic aneurysm. Atherosclerotic calcifications of the aortic arch. Three vessel coronary atherosclerosis. Postsurgical changes related to prior CABG. Mediastinum/Nodes: No suspicious mediastinal lymphadenopathy. 7 mm short axis right hilar node, within normal limits. Mild residual soft tissue in the right paratracheal region without discrete adenopathy (series 2/image 23). Visualized thyroid is unremarkable. Lungs/Pleura: No suspicious pulmonary nodules. Mild centrilobular and paraseptal emphysematous changes, upper lobe predominant. Prior bilateral pleural effusions have resolved. No pneumothorax. Musculoskeletal: Degenerative changes of the thoracic spine. Median sternotomy. CT ABDOMEN PELVIS FINDINGS Hepatobiliary: Liver is within normal limits. Gallbladder is unremarkable. No intrahepatic or extrahepatic ductal dilatation. Pancreas: Within normal limits. Spleen: Within normal limits.  Adrenals/Urinary Tract: Adrenal glands are within normal limits. 3.2 cm right lower pole renal cyst. Subcentimeter left renal cyst. No hydronephrosis. Bladder is thick-walled although underdistended. Stomach/Bowel: Stomach is within normal limits. No evidence of bowel obstruction. Normal appendix (series 2/image 94). Sigmoid diverticulosis, without evidence of diverticulitis. Vascular/Lymphatic: 3.7 x 3.3 cm infrarenal abdominal aortic aneurysm (series 2/image 81), grossly unchanged. Atherosclerotic calcifications of the abdominal aorta and branch vessels. No suspicious abdominopelvic lymphadenopathy. Reproductive: Prostate is grossly unremarkable. Other: No abdominopelvic ascites. Small fat containing bilateral inguinal hernias. Musculoskeletal: Mild degenerative changes of the lumbar spine. IMPRESSION: No convincing evidence of recurrent or metastatic disease. Prior thoracic lymphadenopathy has essentially resolved. Prior pleural effusions have resolved. 3.7 x 3.3 cm infrarenal abdominal aortic aneurysm, grossly unchanged. Specific follow-up for this lesion is not required while this patient continues to undergo oncologic follow-up. Additional stable ancillary findings as above. Electronically Signed   By: Julian Hy M.D.   On: 12/30/2018 12:59    ASSESSMENT AND PLAN: This is a very pleasant 78 years old white male recently diagnosed with extensive stage small cell lung cancer.  The patient is here today for evaluation before starting the first cycle of his systemic chemotherapy with carboplatin, etoposide and Tecentriq.  Status post 6 cycles. Starting from cycle #5 the patient is on treatment with single agent Tecentriq.  The patient is tolerating his maintenance treatment with Tecentriq fairly well. He had repeat CT scan of the chest, abdomen and pelvis performed recently.  I personally and independently reviewed the scan images and discussed the result and showed the images to the patient and his  wife today. His scan showed no concerning findings for recurrent or metastatic disease and there was complete resolution of the prior thoracic lymphadenopathy. I recommended for the patient to continue his current maintenance treatment with Tecentriq and he will proceed with cycle #3 of the maintenance treatment today. I will see the patient back for follow-up visit in 3 weeks for evaluation before starting cycle #4. The patient was advised to call immediately if he has any concerning symptoms in the interval. The patient voices understanding of current disease status and treatment options and is in agreement with the current care plan. All questions were answered. The patient knows to call the clinic with any problems, questions or concerns. We can certainly see the patient much sooner if necessary.  Disclaimer: This note was dictated with voice recognition software. Similar sounding words can inadvertently be transcribed and may not be corrected upon review.

## 2019-01-02 NOTE — Patient Instructions (Signed)
[facility] Discharge Instructions for Patients Receiving Chemotherapy  Today you received the following chemotherapy agents: Atezolizumab [name])  To help prevent nausea and vomiting after your treatment, we encourage you to take your nausea medication as directed.    If you develop nausea and vomiting that is not controlled by your nausea medication, call the clinic.   BELOW ARE SYMPTOMS THAT SHOULD BE REPORTED IMMEDIATELY:  *FEVER GREATER THAN 100.5 F  *CHILLS WITH OR WITHOUT FEVER  NAUSEA AND VOMITING THAT IS NOT CONTROLLED WITH YOUR NAUSEA MEDICATION  *UNUSUAL SHORTNESS OF BREATH  *UNUSUAL BRUISING OR BLEEDING  TENDERNESS IN MOUTH AND THROAT WITH OR WITHOUT PRESENCE OF ULCERS  *URINARY PROBLEMS  *BOWEL PROBLEMS  UNUSUAL RASH Items with * indicate a potential emergency and should be followed up as soon as possible.  Feel free to call the clinic should you have any questions or concerns. The clinic phone number is [phone].  Please show the [place] at check-in to the Emergency Department and triage nurse.

## 2019-01-02 NOTE — Addendum Note (Signed)
Addended by: Ardeen Garland on: 01/02/2019 03:12 PM   Modules accepted: Orders

## 2019-01-17 ENCOUNTER — Other Ambulatory Visit: Payer: Self-pay | Admitting: Physician Assistant

## 2019-01-18 ENCOUNTER — Other Ambulatory Visit: Payer: Self-pay | Admitting: *Deleted

## 2019-01-18 DIAGNOSIS — I484 Atypical atrial flutter: Secondary | ICD-10-CM

## 2019-01-18 MED ORDER — APIXABAN 5 MG PO TABS: 5.0000 mg | ORAL_TABLET | Freq: Two times a day (BID) | ORAL | 2 refills | Status: AC

## 2019-01-18 NOTE — Telephone Encounter (Signed)
Eliquis 5mg  refill request received; pt is 78 yrs old, Wt-82.3kg, Crea-1.44 on 01/02/2019, last seen by Kathyrn Drown, NP on 08/24/2018; will send in refill to requested pharmacy.

## 2019-01-22 DIAGNOSIS — J9611 Chronic respiratory failure with hypoxia: Secondary | ICD-10-CM | POA: Diagnosis not present

## 2019-01-23 ENCOUNTER — Inpatient Hospital Stay: Payer: Medicare HMO

## 2019-01-23 ENCOUNTER — Inpatient Hospital Stay (HOSPITAL_BASED_OUTPATIENT_CLINIC_OR_DEPARTMENT_OTHER): Payer: Medicare HMO | Admitting: Internal Medicine

## 2019-01-23 ENCOUNTER — Encounter: Payer: Self-pay | Admitting: Internal Medicine

## 2019-01-23 ENCOUNTER — Other Ambulatory Visit: Payer: Self-pay

## 2019-01-23 VITALS — BP 96/42 | HR 60 | Temp 98.1°F | Resp 18 | Ht 71.0 in | Wt 181.3 lb

## 2019-01-23 DIAGNOSIS — K4020 Bilateral inguinal hernia, without obstruction or gangrene, not specified as recurrent: Secondary | ICD-10-CM

## 2019-01-23 DIAGNOSIS — I2510 Atherosclerotic heart disease of native coronary artery without angina pectoris: Secondary | ICD-10-CM

## 2019-01-23 DIAGNOSIS — I110 Hypertensive heart disease with heart failure: Secondary | ICD-10-CM | POA: Diagnosis not present

## 2019-01-23 DIAGNOSIS — Z5112 Encounter for antineoplastic immunotherapy: Secondary | ICD-10-CM

## 2019-01-23 DIAGNOSIS — R5382 Chronic fatigue, unspecified: Secondary | ICD-10-CM

## 2019-01-23 DIAGNOSIS — Z9221 Personal history of antineoplastic chemotherapy: Secondary | ICD-10-CM

## 2019-01-23 DIAGNOSIS — E785 Hyperlipidemia, unspecified: Secondary | ICD-10-CM

## 2019-01-23 DIAGNOSIS — E119 Type 2 diabetes mellitus without complications: Secondary | ICD-10-CM | POA: Diagnosis not present

## 2019-01-23 DIAGNOSIS — K5730 Diverticulosis of large intestine without perforation or abscess without bleeding: Secondary | ICD-10-CM

## 2019-01-23 DIAGNOSIS — I10 Essential (primary) hypertension: Secondary | ICD-10-CM

## 2019-01-23 DIAGNOSIS — C3491 Malignant neoplasm of unspecified part of right bronchus or lung: Secondary | ICD-10-CM

## 2019-01-23 DIAGNOSIS — C3490 Malignant neoplasm of unspecified part of unspecified bronchus or lung: Secondary | ICD-10-CM

## 2019-01-23 DIAGNOSIS — Z79899 Other long term (current) drug therapy: Secondary | ICD-10-CM

## 2019-01-23 DIAGNOSIS — I252 Old myocardial infarction: Secondary | ICD-10-CM

## 2019-01-23 DIAGNOSIS — C782 Secondary malignant neoplasm of pleura: Secondary | ICD-10-CM

## 2019-01-23 DIAGNOSIS — I714 Abdominal aortic aneurysm, without rupture: Secondary | ICD-10-CM

## 2019-01-23 DIAGNOSIS — C3411 Malignant neoplasm of upper lobe, right bronchus or lung: Secondary | ICD-10-CM

## 2019-01-23 DIAGNOSIS — N281 Cyst of kidney, acquired: Secondary | ICD-10-CM

## 2019-01-23 DIAGNOSIS — I255 Ischemic cardiomyopathy: Secondary | ICD-10-CM

## 2019-01-23 DIAGNOSIS — D649 Anemia, unspecified: Secondary | ICD-10-CM

## 2019-01-23 DIAGNOSIS — I480 Paroxysmal atrial fibrillation: Secondary | ICD-10-CM

## 2019-01-23 DIAGNOSIS — Z7901 Long term (current) use of anticoagulants: Secondary | ICD-10-CM

## 2019-01-23 DIAGNOSIS — Z7982 Long term (current) use of aspirin: Secondary | ICD-10-CM

## 2019-01-23 LAB — CMP (CANCER CENTER ONLY)
ALT: 20 U/L (ref 0–44)
ANION GAP: 13 (ref 5–15)
AST: 17 U/L (ref 15–41)
Albumin: 3.8 g/dL (ref 3.5–5.0)
Alkaline Phosphatase: 89 U/L (ref 38–126)
BUN: 26 mg/dL — ABNORMAL HIGH (ref 8–23)
CO2: 21 mmol/L — AB (ref 22–32)
Calcium: 8.8 mg/dL — ABNORMAL LOW (ref 8.9–10.3)
Chloride: 105 mmol/L (ref 98–111)
Creatinine: 1.71 mg/dL — ABNORMAL HIGH (ref 0.61–1.24)
GFR, EST AFRICAN AMERICAN: 43 mL/min — AB (ref >60)
GFR, Estimated: 38 mL/min — ABNORMAL LOW (ref >60)
Glucose, Bld: 167 mg/dL — ABNORMAL HIGH (ref 70–99)
Potassium: 4.4 mmol/L (ref 3.5–5.1)
SODIUM: 139 mmol/L (ref 135–145)
Total Bilirubin: 0.4 mg/dL (ref 0.3–1.2)
Total Protein: 7.2 g/dL (ref 6.5–8.1)

## 2019-01-23 LAB — CBC WITH DIFFERENTIAL (CANCER CENTER ONLY)
Abs Immature Granulocytes: 0.01 10*3/uL (ref 0.00–0.07)
BASOS ABS: 0 10*3/uL (ref 0.0–0.1)
Basophils Relative: 1 %
Eosinophils Absolute: 0.3 10*3/uL (ref 0.0–0.5)
Eosinophils Relative: 5 %
HEMATOCRIT: 34.5 % — AB (ref 39.0–52.0)
Hemoglobin: 10.9 g/dL — ABNORMAL LOW (ref 13.0–17.0)
Immature Granulocytes: 0 %
Lymphocytes Relative: 18 %
Lymphs Abs: 1.3 10*3/uL (ref 0.7–4.0)
MCH: 30.1 pg (ref 26.0–34.0)
MCHC: 31.6 g/dL (ref 30.0–36.0)
MCV: 95.3 fL (ref 80.0–100.0)
Monocytes Absolute: 0.6 10*3/uL (ref 0.1–1.0)
Monocytes Relative: 8 %
NEUTROS PCT: 68 %
Neutro Abs: 4.7 10*3/uL (ref 1.7–7.7)
Platelet Count: 170 10*3/uL (ref 150–400)
RBC: 3.62 MIL/uL — ABNORMAL LOW (ref 4.22–5.81)
RDW: 13.3 % (ref 11.5–15.5)
WBC Count: 6.9 10*3/uL (ref 4.0–10.5)
nRBC: 0 % (ref 0.0–0.2)

## 2019-01-23 LAB — TSH: TSH: 0.283 u[IU]/mL — ABNORMAL LOW (ref 0.320–4.118)

## 2019-01-23 MED ORDER — SODIUM CHLORIDE 0.9 % IV SOLN
Freq: Once | INTRAVENOUS | Status: AC
  Administered 2019-01-23: 15:00:00 via INTRAVENOUS
  Filled 2019-01-23: qty 250

## 2019-01-23 MED ORDER — SODIUM CHLORIDE 0.9 % IV SOLN
1200.0000 mg | Freq: Once | INTRAVENOUS | Status: AC
  Administered 2019-01-23: 1200 mg via INTRAVENOUS
  Filled 2019-01-23: qty 20

## 2019-01-23 NOTE — Patient Instructions (Signed)
Coronavirus (COVID-19) Are you at risk?  Are you at risk for the Coronavirus (COVID-19)?  To be considered HIGH RISK for Coronavirus (COVID-19), you have to meet the following criteria:  . Traveled to [place], [place], [place], [place] or [place]; or in the [place] to [place], [place], [place], or [place]; and have fever, cough, and shortness of breath within the last 2 weeks of travel OR . Been in close contact with a person diagnosed with COVID-19 within the last 2 weeks and have fever, cough, and shortness of breath . IF YOU DO NOT MEET THESE CRITERIA, YOU ARE CONSIDERED LOW RISK FOR COVID-19.  What to do if you are HIGH RISK for COVID-19?  . If you are having a medical emergency, call 911. . Seek medical care right away. Before you go to a doctor's office, urgent care or emergency department, call ahead and tell them about your recent travel, contact with someone diagnosed with COVID-19, and your symptoms. You should receive instructions from your physician's office regarding next steps of care.  . When you arrive at healthcare provider, tell the healthcare staff immediately you have returned from visiting [place], [place], [place], [place] or [place]; or traveled in the [place] to [place], [place], [place], or [place]; in the last two weeks or you have been in close contact with a person diagnosed with COVID-19 in the last 2 weeks.   . Tell the health care staff about your symptoms: fever, cough and shortness of breath. . After you have been seen by a medical provider, you will be either: o Tested for (COVID-19) and discharged home on quarantine except to seek medical care if symptoms worsen, and asked to  - Stay home and avoid contact with others until you get your results (4-5 days)  - Avoid travel on public transportation if possible (such as bus, train, or airplane) or o Sent to the Emergency Department by EMS for evaluation, COVID-19 testing, and possible  admission depending on your condition and test results.  What to do if you are LOW RISK for COVID-19?  Reduce your risk of any infection by using the same precautions used for avoiding the common cold or flu:  . Wash your hands often with soap and warm water for at least 20 seconds.  If soap and water are not readily available, use an alcohol-based hand sanitizer with at least 60% alcohol.  . If coughing or sneezing, cover your mouth and nose by coughing or sneezing into the elbow areas of your shirt or coat, into a tissue or into your sleeve (not your hands). . Avoid shaking hands with others and consider head nods or verbal greetings only. . Avoid touching your eyes, nose, or mouth with unwashed hands.  . Avoid close contact with people who are sick. . Avoid places or events with large numbers of people in one location, like concerts or sporting events. . Carefully consider travel plans you have or are making. . If you are planning any travel outside or inside the [place], visit the CDC's Travelers' Health webpage for the latest health notices. . If you have some symptoms but not all symptoms, continue to monitor at home and seek medical attention if your symptoms worsen. . If you are having a medical emergency, call 911.  ADDITIONAL HEALTHCARE OPTIONS FOR PATIENTS  [facility] / e-Visit: [email or web address]         [facility]: [phone]  [facility]: [facility]: [facility] Discharge Instructions for Patients Receiving Chemotherapy  Today you received the following chemotherapy agents: Atezolizumab [name])  To help prevent nausea and vomiting after your treatment, we encourage you to take your nausea medication as directed.    If you develop nausea and vomiting that is not controlled by your nausea medication, call the clinic.    BELOW ARE SYMPTOMS THAT SHOULD BE REPORTED IMMEDIATELY:  *FEVER GREATER THAN 100.5 F  *CHILLS WITH OR WITHOUT FEVER  NAUSEA AND VOMITING THAT IS NOT CONTROLLED WITH YOUR NAUSEA MEDICATION  *UNUSUAL SHORTNESS OF BREATH  *UNUSUAL BRUISING OR BLEEDING  TENDERNESS IN MOUTH AND THROAT WITH OR WITHOUT PRESENCE OF ULCERS  *URINARY PROBLEMS  *BOWEL PROBLEMS  UNUSUAL RASH Items with * indicate a potential emergency and should be followed up as soon as possible.  Feel free to call the clinic should you have any questions or concerns. The clinic phone number is [phone].  Please show the [facility] at check-in to the Emergency Department and triage nurse.

## 2019-01-23 NOTE — Progress Notes (Signed)
[facility] Telephone:(336) [phone]   Fax:(336) Maury, MD [place] [address]  DIAGNOSIS: Extensive stage (T1 a, N2, M1a) extensive stage small cell lung cancer presented with right lung nodules in addition to mediastinal lymphadenopathy as well as pericardial metastasis diagnosed in October 2019  PRIOR THERAPY: None.  CURRENT THERAPY: palliative systemic chemotherapy with carboplatin for AUC of 5 on day 1, etoposide 100 mg/M2 on days 1, 2 and 3 in addition to Tecentriq (Atezolizumab) 1200 mg IV every 3 weeks.  Status post 7 cycles.  Starting from cycle #5 the patient will be treated with maintenance immunotherapy with Tecentriq 1200 mg IV every 3 weeks.  INTERVAL HISTORY: Gregory Bates 78 y.o. male returns to the clinic today for follow-up visit.  The patient is feeling fine today with no concerning complaints except for the baseline shortness of breath and he is currently on home oxygen.  He denied having any chest pain, cough or hemoptysis.  The patient denied having any fever or chills.  He has no nausea, vomiting, diarrhea or constipation.  He continues to tolerate his treatment with Tecentriq fairly well.  He is here for evaluation before starting cycle #8.  MEDICAL HISTORY: Past Medical History:  Diagnosis Date   Acute renal insufficiency    a. During 11/2013 adm with atrial flutter.   Anemia, unspecified    Atrial fibrillation (HCC)    CAD (coronary artery disease), native coronary artery    a. s/p cath May 2011 >> DES distal RCA;  b. 01/2013 NSTEMI >> OM1 99p (2.75x12 Promus Premier DES);  c. LHC (2/15): PCI: Promus DES to dist RCA ;  d. LHC 1/16 - dLM 30, oLAD 30, small D1 tandem 74, pOM1 stent ok, mOM 62-60, dOM bifurcation 50-60 in both vessels, AV 52-70 jailed by stent, pRCA 76 mRCA stent ok, dRCA stent ok, PLB smal 50, dPDA 60, 80; EF 35-40 >> med Rx - PCI of PDA if angina   Chronic  combined systolic and diastolic CHF (congestive heart failure) ([facility]) 12/23/2015   Echo 2/17: Mild LVH, EF 45-50%, inferior akinesis, grade 2 diastolic dysfunction, mild AI, mild MR, mild LAE, PASP 44 mmHg    Colorectal polyps 2002   Diabetes mellitus    AODM   Erectile dysfunction    History of echocardiogram    a. 01/2013 Echo: EF 55%, mid-dist inflat HK, Gr1 DD, Triv AI/TR, Mild MR.;  b.  Echo (1/15): Mild LVH, EF 55%, inferolateral hypokinesis, grade 1 diastolic dysfunction, mild AI, trivial MR, moderate LAE, PASP 36 mmHg    Hx of cardiovascular stress test    Stress myoview 08/07/13 with LVEF 44%, inferior scar, no ischemia   Hyperlipidemia    Hypertension    Hyponatremia    Ischemic cardiomyopathy    a. EF 35-40% by LHC in 1/16; b. Echo 2/17: Mild LVH, EF 45-50%, inferior akinesis, grade 2 diastolic dysfunction, mild AI, mild MR, mild LAE, PASP 44 mmHg   Myocardial infarction (HCC)    Oxygen deficiency    PAF (paroxysmal atrial fibrillation) ([facility])    a. Dx 11/2013. Spont conv. Placed on eliquis.   Small cell lung cancer in adult [facility]) 07/28/2018    ALLERGIES:  is allergic to brilinta [ticagrelor] and crestor [rosuvastatin].  MEDICATIONS:  Current Outpatient Medications  Medication Sig Dispense Refill   acetaminophen (TYLENOL) 325 MG tablet Take 2 tablets (650 mg total) by mouth  every 6 (six) hours as needed for mild pain. (Patient not taking: Reported on 11/21/2018)     albuterol (PROVENTIL HFA;VENTOLIN HFA) 108 (90 Base) MCG/ACT inhaler Inhale 2 puffs into the lungs every 6 (six) hours as needed for wheezing or shortness of breath. (Patient not taking: Reported on 11/21/2018) 1 Inhaler 2   amiodarone (PACERONE) 200 MG tablet Take 1 tablet (200 mg total) by mouth 2 (two) times daily. For one week;then take 200 mg daily thereafter (Patient taking differently: Take 200 mg by mouth See admin instructions. Take 200mg  by mouth twice daily for one week;then take 200 mg daily  thereafter) 90 tablet 3   apixaban (ELIQUIS) 5 MG TABS tablet Take 1 tablet (5 mg total) by mouth 2 (two) times daily. 180 tablet 2   aspirin EC 81 MG EC tablet Take 1 tablet (81 mg total) by mouth daily.     atorvastatin (LIPITOR) 20 MG tablet TAKE 1 TABLET EVERY DAY 90 tablet 3   cholecalciferol (VITAMIN D) 1000 UNITS tablet Take 1,000 Units by mouth every evening.      clopidogrel (PLAVIX) 75 MG tablet      fexofenadine (ALLEGRA) 180 MG tablet Take 180 mg by mouth daily as needed for allergies.      fluticasone (FLONASE) 50 MCG/ACT nasal spray Place 1 spray into both nostrils daily as needed for allergies.      furosemide (LASIX) 40 MG tablet Take 1 tablet (40 mg total) by mouth 2 (two) times daily. 60 tablet 3   guaiFENesin (MUCINEX) 600 MG 12 hr tablet Take 1 tablet (600 mg total) by mouth 2 (two) times daily as needed for cough or to loosen phlegm.     Iron Combinations (IRON COMPLEX PO) Take 1 tablet by mouth daily.     isosorbide mononitrate (IMDUR) 60 MG 24 hr tablet TAKE 1 TABLET EVERY DAY 90 tablet 2   metoprolol tartrate (LOPRESSOR) 25 MG tablet TAKE 1 TABLET TWICE DAILY 180 tablet 2   Multiple Vitamin (MULTIVITAMIN) tablet Take 1 tablet by mouth at bedtime.      ondansetron (ZOFRAN ODT) 4 MG disintegrating tablet Take 1 tablet (4 mg total) by mouth every 8 (eight) hours as needed for nausea or vomiting. (Patient not taking: Reported on 11/21/2018) 20 tablet 0   prochlorperazine (COMPAZINE) 10 MG tablet Take 1 tablet (10 mg total) by mouth every 6 (six) hours as needed for nausea or vomiting. (Patient not taking: Reported on 11/21/2018) 30 tablet 0   vitamin E 100 UNIT capsule Take 100 Units by mouth at bedtime.      No current facility-administered medications for this visit.     SURGICAL HISTORY:  Past Surgical History:  Procedure Laterality Date   BIOPSY  08/01/2018   Procedure: BIOPSY OF PERICARDIAL LYMPH NODE AND HILAR LYMPH NODE;  Surgeon: Grace Isaac,  MD;  Location: [facility];  Service: Open Heart Surgery;;   CLIPPING OF ATRIAL APPENDAGE Left 08/01/2018   Procedure: CLIPPING OF ATRIAL APPENDAGE;  Surgeon: Grace Isaac, MD;  Location: [facility];  Service: Open Heart Surgery;  Laterality: Left;   COLONOSCOPY W/ POLYPECTOMY  2002   [facility]   COLONOSCOPY W/ POLYPECTOMY  2004; 2007 negative   CORONARY ANGIOPLASTY  12/26/2013   CORONARY ARTERY BYPASS GRAFT N/A 08/01/2018   Procedure: CORONARY ARTERY BYPASS GRAFTING (CABG) TIMES THREE USING LEFT INTERNAL MAMMARY ARTERY TO LAD, RIGHT GREATER SAPHENOUS VEIN GRAFT TO OM AND RCA, VEIN HARVESTED ENDOSCOPICALLY;  Surgeon: Grace Isaac,  MD;  Location: [facility];  Service: Open Heart Surgery;  Laterality: N/A;   CORONARY STENT PLACEMENT  2007, 2011   infantile paralysis facial asymmetry     LEFT HEART CATH AND CORONARY ANGIOGRAPHY N/A 05/18/2017   Procedure: Left Heart Cath and Coronary Angiography;  Surgeon: Martinique, Peter M, MD;  Location: [facility] CV LAB;  Service: Cardiovascular;  Laterality: N/A;   LEFT HEART CATH AND CORONARY ANGIOGRAPHY N/A 07/27/2018   Procedure: LEFT HEART CATH AND CORONARY ANGIOGRAPHY;  Surgeon: Martinique, Peter M, MD;  Location: [facility];  Service: Cardiovascular;  Laterality: N/A;   LEFT HEART CATHETERIZATION WITH CORONARY ANGIOGRAM N/A 10/02/2012   Procedure: LEFT HEART CATHETERIZATION WITH CORONARY ANGIOGRAM;  Surgeon: Peter M Martinique, MD;  Location: [facility] CATH LAB;  Service: Cardiovascular;  Laterality: N/A;   LEFT HEART CATHETERIZATION WITH CORONARY ANGIOGRAM N/A 02/20/2013   Procedure: LEFT HEART CATHETERIZATION WITH CORONARY ANGIOGRAM;  Surgeon: Burnell Blanks, MD;  Location: [facility] CATH LAB;  Service: Cardiovascular;  Laterality: N/A;   LEFT HEART CATHETERIZATION WITH CORONARY ANGIOGRAM N/A 12/26/2013   Procedure: LEFT HEART CATHETERIZATION WITH CORONARY ANGIOGRAM;  Surgeon: Burnell Blanks, MD;  Location: [facility] CATH LAB;  Service: Cardiovascular;   Laterality: N/A;   LEFT HEART CATHETERIZATION WITH CORONARY ANGIOGRAM N/A 11/28/2014   Procedure: LEFT HEART CATHETERIZATION WITH CORONARY ANGIOGRAM;  Surgeon: Burnell Blanks, MD;  Location: [facility] CATH LAB;  Service: Cardiovascular;  Laterality: N/A;   PERCUTANEOUS CORONARY STENT INTERVENTION (PCI-S)  10/02/2012   Procedure: PERCUTANEOUS CORONARY STENT INTERVENTION (PCI-S);  Surgeon: Peter M Martinique, MD;  Location: [facility] CATH LAB;  Service: Cardiovascular;;   PERCUTANEOUS CORONARY STENT INTERVENTION (PCI-S)  02/20/2013   Procedure: PERCUTANEOUS CORONARY STENT INTERVENTION (PCI-S);  Surgeon: Burnell Blanks, MD;  Location: [facility] CATH LAB;  Service: Cardiovascular;;   PERCUTANEOUS CORONARY STENT INTERVENTION (PCI-S)  12/26/2013   Procedure: PERCUTANEOUS CORONARY STENT INTERVENTION (PCI-S);  Surgeon: Burnell Blanks, MD;  Location: [facility] CATH LAB;  Service: Cardiovascular;;   TEE WITHOUT CARDIOVERSION N/A 08/01/2018   Procedure: TRANSESOPHAGEAL ECHOCARDIOGRAM (TEE);  Surgeon: Grace Isaac, MD;  Location: [facility];  Service: Open Heart Surgery;  Laterality: N/A;   WISDOM TOOTH EXTRACTION      REVIEW OF SYSTEMS:  A comprehensive review of systems was negative except for: Respiratory: positive for dyspnea on exertion   PHYSICAL EXAMINATION: General appearance: alert, cooperative, fatigued and no distress Head: Normocephalic, without obvious abnormality, atraumatic Neck: no adenopathy, no JVD, supple, symmetrical, trachea midline and thyroid not enlarged, symmetric, no tenderness/mass/nodules Lymph nodes: Cervical, supraclavicular, and axillary nodes normal. Resp: clear to auscultation bilaterally Back: symmetric, no curvature. ROM normal. No CVA tenderness. Cardio: regular rate and rhythm, S1, S2 normal, no murmur, click, rub or gallop GI: soft, non-tender; bowel sounds normal; no masses,  no organomegaly Extremities: extremities normal, atraumatic, no cyanosis or edema  ECOG  PERFORMANCE STATUS: 1 - Symptomatic but completely ambulatory  Blood pressure (!) 96/42, pulse 60, temperature 98.1 F (36.7 C), temperature source Oral, resp. rate 18, height 5\' 11"  (1.803 m), weight 181 lb 4.8 oz (82.2 kg), SpO2 100 %.  LABORATORY DATA: Lab Results  Component Value Date   WBC 6.9 01/23/2019   HGB 10.9 (L) 01/23/2019   HCT 34.5 (L) 01/23/2019   MCV 95.3 01/23/2019   PLT 170 01/23/2019      Chemistry      Component Value Date/Time   NA 139 01/02/2019 1303   NA 126 (L) 08/24/2018 1237   K  4.2 01/02/2019 1303   CL 104 01/02/2019 1303   CO2 25 01/02/2019 1303   BUN 19 01/02/2019 1303   BUN 19 08/24/2018 1237   CREATININE 1.44 (H) 01/02/2019 1303   CREATININE 1.67 (H) 10/30/2016 1033      Component Value Date/Time   CALCIUM 9.3 01/02/2019 1303   ALKPHOS 90 01/02/2019 1303   AST 16 01/02/2019 1303   ALT 15 01/02/2019 1303   BILITOT 0.4 01/02/2019 1303       RADIOGRAPHIC STUDIES: Ct Chest W Contrast  Result Date: 12/30/2018 CLINICAL DATA:  Right lung cancer, chemotherapy complete EXAM: CT CHEST, ABDOMEN, AND PELVIS WITH CONTRAST TECHNIQUE: Multidetector CT imaging of the chest, abdomen and pelvis was performed following the standard protocol during bolus administration of intravenous contrast. CONTRAST:  81mL OMNIPAQUE IOHEXOL 300 MG/ML  SOLN COMPARISON:  10/27/2018 FINDINGS: CT CHEST FINDINGS Cardiovascular: Heart is normal in size.  No pericardial effusion. No evidence of thoracic aortic aneurysm. Atherosclerotic calcifications of the aortic arch. Three vessel coronary atherosclerosis. Postsurgical changes related to prior CABG. Mediastinum/Nodes: No suspicious mediastinal lymphadenopathy. 7 mm short axis right hilar node, within normal limits. Mild residual soft tissue in the right paratracheal region without discrete adenopathy (series 2/image 23). Visualized thyroid is unremarkable. Lungs/Pleura: No suspicious pulmonary nodules. Mild centrilobular and  paraseptal emphysematous changes, upper lobe predominant. Prior bilateral pleural effusions have resolved. No pneumothorax. Musculoskeletal: Degenerative changes of the thoracic spine. Median sternotomy. CT ABDOMEN PELVIS FINDINGS Hepatobiliary: Liver is within normal limits. Gallbladder is unremarkable. No intrahepatic or extrahepatic ductal dilatation. Pancreas: Within normal limits. Spleen: Within normal limits. Adrenals/Urinary Tract: Adrenal glands are within normal limits. 3.2 cm right lower pole renal cyst. Subcentimeter left renal cyst. No hydronephrosis. Bladder is thick-walled although underdistended. Stomach/Bowel: Stomach is within normal limits. No evidence of bowel obstruction. Normal appendix (series 2/image 94). Sigmoid diverticulosis, without evidence of diverticulitis. Vascular/Lymphatic: 3.7 x 3.3 cm infrarenal abdominal aortic aneurysm (series 2/image 81), grossly unchanged. Atherosclerotic calcifications of the abdominal aorta and branch vessels. No suspicious abdominopelvic lymphadenopathy. Reproductive: Prostate is grossly unremarkable. Other: No abdominopelvic ascites. Small fat containing bilateral inguinal hernias. Musculoskeletal: Mild degenerative changes of the lumbar spine. IMPRESSION: No convincing evidence of recurrent or metastatic disease. Prior thoracic lymphadenopathy has essentially resolved. Prior pleural effusions have resolved. 3.7 x 3.3 cm infrarenal abdominal aortic aneurysm, grossly unchanged. Specific follow-up for this lesion is not required while this patient continues to undergo oncologic follow-up. Additional stable ancillary findings as above. Electronically Signed   By: Julian Hy M.D.   On: 12/30/2018 12:59   Ct Abdomen Pelvis W Contrast  Result Date: 12/30/2018 CLINICAL DATA:  Right lung cancer, chemotherapy complete EXAM: CT CHEST, ABDOMEN, AND PELVIS WITH CONTRAST TECHNIQUE: Multidetector CT imaging of the chest, abdomen and pelvis was performed  following the standard protocol during bolus administration of intravenous contrast. CONTRAST:  69mL OMNIPAQUE IOHEXOL 300 MG/ML  SOLN COMPARISON:  10/27/2018 FINDINGS: CT CHEST FINDINGS Cardiovascular: Heart is normal in size.  No pericardial effusion. No evidence of thoracic aortic aneurysm. Atherosclerotic calcifications of the aortic arch. Three vessel coronary atherosclerosis. Postsurgical changes related to prior CABG. Mediastinum/Nodes: No suspicious mediastinal lymphadenopathy. 7 mm short axis right hilar node, within normal limits. Mild residual soft tissue in the right paratracheal region without discrete adenopathy (series 2/image 23). Visualized thyroid is unremarkable. Lungs/Pleura: No suspicious pulmonary nodules. Mild centrilobular and paraseptal emphysematous changes, upper lobe predominant. Prior bilateral pleural effusions have resolved. No pneumothorax. Musculoskeletal: Degenerative changes of the thoracic spine. Median  sternotomy. CT ABDOMEN PELVIS FINDINGS Hepatobiliary: Liver is within normal limits. Gallbladder is unremarkable. No intrahepatic or extrahepatic ductal dilatation. Pancreas: Within normal limits. Spleen: Within normal limits. Adrenals/Urinary Tract: Adrenal glands are within normal limits. 3.2 cm right lower pole renal cyst. Subcentimeter left renal cyst. No hydronephrosis. Bladder is thick-walled although underdistended. Stomach/Bowel: Stomach is within normal limits. No evidence of bowel obstruction. Normal appendix (series 2/image 94). Sigmoid diverticulosis, without evidence of diverticulitis. Vascular/Lymphatic: 3.7 x 3.3 cm infrarenal abdominal aortic aneurysm (series 2/image 81), grossly unchanged. Atherosclerotic calcifications of the abdominal aorta and branch vessels. No suspicious abdominopelvic lymphadenopathy. Reproductive: Prostate is grossly unremarkable. Other: No abdominopelvic ascites. Small fat containing bilateral inguinal hernias. Musculoskeletal: Mild  degenerative changes of the lumbar spine. IMPRESSION: No convincing evidence of recurrent or metastatic disease. Prior thoracic lymphadenopathy has essentially resolved. Prior pleural effusions have resolved. 3.7 x 3.3 cm infrarenal abdominal aortic aneurysm, grossly unchanged. Specific follow-up for this lesion is not required while this patient continues to undergo oncologic follow-up. Additional stable ancillary findings as above. Electronically Signed   By: Julian Hy M.D.   On: 12/30/2018 12:59    ASSESSMENT AND PLAN: This is a very pleasant 78 years old white male recently diagnosed with extensive stage small cell lung cancer.  The patient is here today for evaluation before starting the first cycle of his systemic chemotherapy with carboplatin, etoposide and Tecentriq.  Status post 7 cycles. Starting from cycle #5 the patient is on treatment with single agent Tecentriq.  The patient has been tolerating this treatment well with no concerning adverse effects. I recommended for him to proceed with cycle #8 today as scheduled. I will see him back for follow-up visit in 3 weeks for evaluation before starting cycle #9. He was advised to call immediately if he has any concerning symptoms in the interval. The patient voices understanding of current disease status and treatment options and is in agreement with the current care plan. All questions were answered. The patient knows to call the clinic with any problems, questions or concerns. We can certainly see the patient much sooner if necessary.  Disclaimer: This note was dictated with voice recognition software. Similar sounding words can inadvertently be transcribed and may not be corrected upon review.

## 2019-01-23 NOTE — Progress Notes (Signed)
Okay to treat- Ok to treat pt today with Tencentriq and BP reading and today 's creatinine.

## 2019-01-24 ENCOUNTER — Telehealth: Payer: Self-pay | Admitting: Internal Medicine

## 2019-01-24 NOTE — Telephone Encounter (Signed)
Patient already on schedule as requested per 3/23 los.

## 2019-02-13 ENCOUNTER — Inpatient Hospital Stay: Payer: Medicare HMO

## 2019-02-13 ENCOUNTER — Encounter: Payer: Self-pay | Admitting: Internal Medicine

## 2019-02-13 ENCOUNTER — Other Ambulatory Visit: Payer: Self-pay

## 2019-02-13 ENCOUNTER — Inpatient Hospital Stay (HOSPITAL_BASED_OUTPATIENT_CLINIC_OR_DEPARTMENT_OTHER): Payer: Medicare HMO | Admitting: Internal Medicine

## 2019-02-13 ENCOUNTER — Inpatient Hospital Stay: Payer: Medicare HMO | Attending: Internal Medicine

## 2019-02-13 VITALS — BP 100/42 | HR 58 | Temp 98.0°F | Resp 18 | Ht 71.0 in | Wt 182.2 lb

## 2019-02-13 DIAGNOSIS — Z7982 Long term (current) use of aspirin: Secondary | ICD-10-CM

## 2019-02-13 DIAGNOSIS — Z79899 Other long term (current) drug therapy: Secondary | ICD-10-CM

## 2019-02-13 DIAGNOSIS — R0602 Shortness of breath: Secondary | ICD-10-CM

## 2019-02-13 DIAGNOSIS — Z5112 Encounter for antineoplastic immunotherapy: Secondary | ICD-10-CM | POA: Diagnosis not present

## 2019-02-13 DIAGNOSIS — Z7901 Long term (current) use of anticoagulants: Secondary | ICD-10-CM

## 2019-02-13 DIAGNOSIS — Z9221 Personal history of antineoplastic chemotherapy: Secondary | ICD-10-CM | POA: Diagnosis not present

## 2019-02-13 DIAGNOSIS — E119 Type 2 diabetes mellitus without complications: Secondary | ICD-10-CM

## 2019-02-13 DIAGNOSIS — I110 Hypertensive heart disease with heart failure: Secondary | ICD-10-CM | POA: Insufficient documentation

## 2019-02-13 DIAGNOSIS — E785 Hyperlipidemia, unspecified: Secondary | ICD-10-CM | POA: Insufficient documentation

## 2019-02-13 DIAGNOSIS — I10 Essential (primary) hypertension: Secondary | ICD-10-CM

## 2019-02-13 DIAGNOSIS — C3491 Malignant neoplasm of unspecified part of right bronchus or lung: Secondary | ICD-10-CM | POA: Insufficient documentation

## 2019-02-13 DIAGNOSIS — I255 Ischemic cardiomyopathy: Secondary | ICD-10-CM | POA: Diagnosis not present

## 2019-02-13 DIAGNOSIS — C771 Secondary and unspecified malignant neoplasm of intrathoracic lymph nodes: Secondary | ICD-10-CM

## 2019-02-13 DIAGNOSIS — C3411 Malignant neoplasm of upper lobe, right bronchus or lung: Secondary | ICD-10-CM

## 2019-02-13 DIAGNOSIS — C7989 Secondary malignant neoplasm of other specified sites: Secondary | ICD-10-CM | POA: Diagnosis not present

## 2019-02-13 DIAGNOSIS — I252 Old myocardial infarction: Secondary | ICD-10-CM

## 2019-02-13 DIAGNOSIS — I480 Paroxysmal atrial fibrillation: Secondary | ICD-10-CM

## 2019-02-13 DIAGNOSIS — I2510 Atherosclerotic heart disease of native coronary artery without angina pectoris: Secondary | ICD-10-CM | POA: Insufficient documentation

## 2019-02-13 DIAGNOSIS — C3490 Malignant neoplasm of unspecified part of unspecified bronchus or lung: Secondary | ICD-10-CM

## 2019-02-13 DIAGNOSIS — R5382 Chronic fatigue, unspecified: Secondary | ICD-10-CM

## 2019-02-13 LAB — CBC WITH DIFFERENTIAL (CANCER CENTER ONLY)
Abs Immature Granulocytes: 0.02 10*3/uL (ref 0.00–0.07)
Basophils Absolute: 0.1 10*3/uL (ref 0.0–0.1)
Basophils Relative: 1 %
Eosinophils Absolute: 0.5 10*3/uL (ref 0.0–0.5)
Eosinophils Relative: 7 %
HCT: 35.4 % — ABNORMAL LOW (ref 39.0–52.0)
Hemoglobin: 11.2 g/dL — ABNORMAL LOW (ref 13.0–17.0)
Immature Granulocytes: 0 %
Lymphocytes Relative: 16 %
Lymphs Abs: 1.2 10*3/uL (ref 0.7–4.0)
MCH: 29.5 pg (ref 26.0–34.0)
MCHC: 31.6 g/dL (ref 30.0–36.0)
MCV: 93.2 fL (ref 80.0–100.0)
Monocytes Absolute: 0.7 10*3/uL (ref 0.1–1.0)
Monocytes Relative: 10 %
Neutro Abs: 4.7 10*3/uL (ref 1.7–7.7)
Neutrophils Relative %: 66 %
Platelet Count: 168 10*3/uL (ref 150–400)
RBC: 3.8 MIL/uL — ABNORMAL LOW (ref 4.22–5.81)
RDW: 13.4 % (ref 11.5–15.5)
WBC Count: 7.1 10*3/uL (ref 4.0–10.5)
nRBC: 0 % (ref 0.0–0.2)

## 2019-02-13 LAB — CMP (CANCER CENTER ONLY)
ALT: 20 U/L (ref 0–44)
AST: 20 U/L (ref 15–41)
Albumin: 3.7 g/dL (ref 3.5–5.0)
Alkaline Phosphatase: 83 U/L (ref 38–126)
Anion gap: 11 (ref 5–15)
BUN: 19 mg/dL (ref 8–23)
CO2: 22 mmol/L (ref 22–32)
Calcium: 9.1 mg/dL (ref 8.9–10.3)
Chloride: 106 mmol/L (ref 98–111)
Creatinine: 1.58 mg/dL — ABNORMAL HIGH (ref 0.61–1.24)
GFR, Est AFR Am: 48 mL/min — ABNORMAL LOW (ref >60)
GFR, Estimated: 41 mL/min — ABNORMAL LOW (ref >60)
Glucose, Bld: 133 mg/dL — ABNORMAL HIGH (ref 70–99)
Potassium: 4.4 mmol/L (ref 3.5–5.1)
Sodium: 139 mmol/L (ref 135–145)
Total Bilirubin: 0.4 mg/dL (ref 0.3–1.2)
Total Protein: 6.9 g/dL (ref 6.5–8.1)

## 2019-02-13 LAB — TSH: TSH: 0.301 u[IU]/mL — ABNORMAL LOW (ref 0.320–4.118)

## 2019-02-13 MED ORDER — SODIUM CHLORIDE 0.9 % IV SOLN
Freq: Once | INTRAVENOUS | Status: AC
  Administered 2019-02-13: 12:00:00 via INTRAVENOUS
  Filled 2019-02-13: qty 250

## 2019-02-13 MED ORDER — SODIUM CHLORIDE 0.9 % IV SOLN
1200.0000 mg | Freq: Once | INTRAVENOUS | Status: AC
  Administered 2019-02-13: 1200 mg via INTRAVENOUS
  Filled 2019-02-13: qty 20

## 2019-02-13 NOTE — Patient Instructions (Signed)
[facility] Discharge Instructions for Patients Receiving Chemotherapy  Today you received the following chemotherapy agents:  Tecentriq (atezolizumab)  To help prevent nausea and vomiting after your treatment, we encourage you to take your nausea medication as prescribed.   If you develop nausea and vomiting that is not controlled by your nausea medication, call the clinic.   BELOW ARE SYMPTOMS THAT SHOULD BE REPORTED IMMEDIATELY:  *FEVER GREATER THAN 100.5 F  *CHILLS WITH OR WITHOUT FEVER  NAUSEA AND VOMITING THAT IS NOT CONTROLLED WITH YOUR NAUSEA MEDICATION  *UNUSUAL SHORTNESS OF BREATH  *UNUSUAL BRUISING OR BLEEDING  TENDERNESS IN MOUTH AND THROAT WITH OR WITHOUT PRESENCE OF ULCERS  *URINARY PROBLEMS  *BOWEL PROBLEMS  UNUSUAL RASH Items with * indicate a potential emergency and should be followed up as soon as possible.  Feel free to call the clinic should you have any questions or concerns. The clinic phone number is [phone].  Please show the [facility] at check-in to the Emergency Department and triage nurse.

## 2019-02-13 NOTE — Progress Notes (Signed)
[place] [phone]   Fax:([phone]) [place], [place] [address]  DIAGNOSIS: Extensive stage (T1 a, N2, M1a) extensive stage small cell lung cancer presented with right lung nodules in addition to mediastinal lymphadenopathy as well as pericardial metastasis diagnosed in October 2019  PRIOR THERAPY: None.  CURRENT THERAPY: palliative systemic chemotherapy with carboplatin for AUC of 5 on day 1, etoposide 100 mg/M2 on days 1, 2 and 3 in addition to Tecentriq (Atezolizumab) 1200 mg IV every 3 weeks.  Status post 8 cycles.  Starting from cycle #5 the patient will be treated with maintenance immunotherapy with Tecentriq 1200 mg IV every 3 weeks.  INTERVAL HISTORY: Gregory Bates 78 y.o. male returns to the clinic today for follow-up visit.  The patient is feeling fine with no concerning complaints except for the baseline shortness of breath and he is currently on home oxygen.  He denied having any chest pain, cough or hemoptysis.  He denied having any weight loss or night sweats.  He has no nausea, vomiting, diarrhea or constipation.  He has no headache or visual changes.  He continues to tolerate his treatment with Tecentriq fairly well.  The patient is here today for evaluation before starting cycle #9.   MEDICAL HISTORY: Past Medical History:  Diagnosis Date  . Acute renal insufficiency    a. During 11/2013 adm with atrial flutter.  . Anemia, unspecified   . Atrial fibrillation ([place])   . CAD (coronary artery disease), native coronary artery    a. s/p cath May 2011 >> DES distal RCA;  b. 01/2013 NSTEMI >> OM1 99p (2.75x12 Promus Premier DES);  c. LHC (2/15): PCI: Promus DES to dist RCA ;  d. LHC 1/16 - dLM 35, oLAD 30, small D1 tandem 27, pOM1 stent ok, mOM 50-60, dOM bifurcation 50-60 in both vessels, AV 56-70 jailed by stent, pRCA 25 mRCA stent ok, dRCA stent ok, PLB smal 50, dPDA 60, 80; EF  35-40 >> med Rx - PCI of PDA if angina  . Chronic combined systolic and diastolic CHF (congestive heart failure) ([place]) 12/23/2015   Echo 2/17: Mild LVH, EF 45-50%, inferior akinesis, grade 2 diastolic dysfunction, mild AI, mild MR, mild LAE, PASP 44 mmHg   . Colorectal polyps 2002  . Diabetes mellitus    AODM  . Erectile dysfunction   . History of echocardiogram    a. 01/2013 Echo: EF 55%, mid-dist inflat HK, Gr1 DD, Triv AI/TR, Mild MR.;  b.  Echo (1/15): Mild LVH, EF 55%, inferolateral hypokinesis, grade 1 diastolic dysfunction, mild AI, trivial MR, moderate LAE, PASP 36 mmHg   . Hx of cardiovascular stress test    Stress myoview 08/07/13 with LVEF 44%, inferior scar, no ischemia  . Hyperlipidemia   . Hypertension   . Hyponatremia   . Ischemic cardiomyopathy    a. EF 35-40% by LHC in 1/16; b. Echo 2/17: Mild LVH, EF 45-50%, inferior akinesis, grade 2 diastolic dysfunction, mild AI, mild MR, mild LAE, PASP 44 mmHg  . Myocardial infarction ([place])   . Oxygen deficiency   . PAF (paroxysmal atrial fibrillation) ([place])    a. Dx 11/2013. Spont conv. Placed on eliquis.  . Small cell lung cancer in adult [facility]) 07/28/2018    ALLERGIES:  is allergic to brilinta [ticagrelor] and crestor [rosuvastatin].  MEDICATIONS:  Current Outpatient Medications  Medication Sig Dispense Refill  . acetaminophen (TYLENOL)  325 MG tablet Take 2 tablets (650 mg total) by mouth every 6 (six) hours as needed for mild pain. (Patient not taking: Reported on 11/21/2018)    . albuterol (PROVENTIL HFA;VENTOLIN HFA) 108 (90 Base) MCG/ACT inhaler Inhale 2 puffs into the lungs every 6 (six) hours as needed for wheezing or shortness of breath. (Patient not taking: Reported on 11/21/2018) 1 Inhaler 2  . amiodarone (PACERONE) 200 MG tablet Take 1 tablet (200 mg total) by mouth 2 (two) times daily. For one week;then take 200 mg daily thereafter (Patient taking differently: Take 200 mg by mouth See admin instructions. Take 200mg  by mouth  twice daily for one week;then take 200 mg daily thereafter) 90 tablet 3  . apixaban (ELIQUIS) 5 MG TABS tablet Take 1 tablet (5 mg total) by mouth 2 (two) times daily. 180 tablet 2  . aspirin EC 81 MG EC tablet Take 1 tablet (81 mg total) by mouth daily.    [name] Kitchen atorvastatin (LIPITOR) 20 MG tablet TAKE 1 TABLET EVERY DAY 90 tablet 3  . cholecalciferol (VITAMIN D) 1000 UNITS tablet Take 1,000 Units by mouth every evening.     . clopidogrel (PLAVIX) 75 MG tablet     . fexofenadine (ALLEGRA) 180 MG tablet Take 180 mg by mouth daily as needed for allergies.     . fluticasone (FLONASE) 50 MCG/ACT nasal spray Place 1 spray into both nostrils daily as needed for allergies.     . furosemide (LASIX) 40 MG tablet Take 1 tablet (40 mg total) by mouth 2 (two) times daily. 60 tablet 3  . guaiFENesin (MUCINEX) 600 MG 12 hr tablet Take 1 tablet (600 mg total) by mouth 2 (two) times daily as needed for cough or to loosen phlegm.    . Iron Combinations (IRON COMPLEX PO) Take 1 tablet by mouth daily.    . isosorbide mononitrate (IMDUR) 60 MG 24 hr tablet TAKE 1 TABLET EVERY DAY 90 tablet 2  . metoprolol tartrate (LOPRESSOR) 25 MG tablet TAKE 1 TABLET TWICE DAILY 180 tablet 2  . Multiple Vitamin (MULTIVITAMIN) tablet Take 1 tablet by mouth at bedtime.     . ondansetron (ZOFRAN ODT) 4 MG disintegrating tablet Take 1 tablet (4 mg total) by mouth every 8 (eight) hours as needed for nausea or vomiting. (Patient not taking: Reported on 11/21/2018) 20 tablet 0  . prochlorperazine (COMPAZINE) 10 MG tablet Take 1 tablet (10 mg total) by mouth every 6 (six) hours as needed for nausea or vomiting. (Patient not taking: Reported on 11/21/2018) 30 tablet 0  . vitamin E 100 UNIT capsule Take 100 Units by mouth at bedtime.      No current facility-administered medications for this visit.     SURGICAL HISTORY:  Past Surgical History:  Procedure Laterality Date  . BIOPSY  08/01/2018   Procedure: BIOPSY OF PERICARDIAL LYMPH NODE AND  HILAR LYMPH NODE;  Surgeon: Grace Isaac, MD;  Location: [place];  Service: Open Heart Surgery;;  . CLIPPING OF ATRIAL APPENDAGE Left 08/01/2018   Procedure: CLIPPING OF ATRIAL APPENDAGE;  Surgeon: Grace Isaac, MD;  Location: [place];  Service: Open Heart Surgery;  Laterality: Left;  . COLONOSCOPY W/ POLYPECTOMY  2002   [facility]  . COLONOSCOPY W/ POLYPECTOMY  2004; 2007 negative  . CORONARY ANGIOPLASTY  12/26/2013  . CORONARY ARTERY BYPASS GRAFT N/A 08/01/2018   Procedure: CORONARY ARTERY BYPASS GRAFTING (CABG) TIMES THREE USING LEFT INTERNAL MAMMARY ARTERY TO LAD, RIGHT GREATER SAPHENOUS VEIN GRAFT TO  OM AND RCA, VEIN HARVESTED ENDOSCOPICALLY;  Surgeon: Grace Isaac, MD;  Location: [place];  Service: Open Heart Surgery;  Laterality: N/A;  . CORONARY STENT PLACEMENT  2007, 2011  . infantile paralysis facial asymmetry    . LEFT HEART CATH AND CORONARY ANGIOGRAPHY N/A 05/18/2017   Procedure: Left Heart Cath and Coronary Angiography;  Surgeon: Martinique, Peter M, MD;  Location: [place] [facility];  Service: Cardiovascular;  Laterality: N/A;  . LEFT HEART CATH AND CORONARY ANGIOGRAPHY N/A 07/27/2018   Procedure: LEFT HEART CATH AND CORONARY ANGIOGRAPHY;  Surgeon: Martinique, Peter M, MD;  Location: [place] [facility];  Service: Cardiovascular;  Laterality: N/A;  . LEFT HEART CATHETERIZATION WITH CORONARY ANGIOGRAM N/A 10/02/2012   Procedure: LEFT HEART CATHETERIZATION WITH CORONARY ANGIOGRAM;  Surgeon: Peter M Martinique, MD;  Location: [facility] CATH LAB;  Service: Cardiovascular;  Laterality: N/A;  . LEFT HEART CATHETERIZATION WITH CORONARY ANGIOGRAM N/A 02/20/2013   Procedure: LEFT HEART CATHETERIZATION WITH CORONARY ANGIOGRAM;  Surgeon: Burnell Blanks, MD;  Location: [facility] CATH LAB;  Service: Cardiovascular;  Laterality: N/A;  . LEFT HEART CATHETERIZATION WITH CORONARY ANGIOGRAM N/A 12/26/2013   Procedure: LEFT HEART CATHETERIZATION WITH CORONARY ANGIOGRAM;  Surgeon: Burnell Blanks, MD;   Location: [facility] CATH LAB;  Service: Cardiovascular;  Laterality: N/A;  . LEFT HEART CATHETERIZATION WITH CORONARY ANGIOGRAM N/A 11/28/2014   Procedure: LEFT HEART CATHETERIZATION WITH CORONARY ANGIOGRAM;  Surgeon: Burnell Blanks, MD;  Location: [facility] CATH LAB;  Service: Cardiovascular;  Laterality: N/A;  . PERCUTANEOUS CORONARY STENT INTERVENTION (PCI-S)  10/02/2012   Procedure: PERCUTANEOUS CORONARY STENT INTERVENTION (PCI-S);  Surgeon: Peter M Martinique, MD;  Location: [facility] CATH LAB;  Service: Cardiovascular;;  . PERCUTANEOUS CORONARY STENT INTERVENTION (PCI-S)  02/20/2013   Procedure: PERCUTANEOUS CORONARY STENT INTERVENTION (PCI-S);  Surgeon: Burnell Blanks, MD;  Location: [facility] CATH LAB;  Service: Cardiovascular;;  . PERCUTANEOUS CORONARY STENT INTERVENTION (PCI-S)  12/26/2013   Procedure: PERCUTANEOUS CORONARY STENT INTERVENTION (PCI-S);  Surgeon: Burnell Blanks, MD;  Location: [facility] CATH LAB;  Service: Cardiovascular;;  . TEE WITHOUT CARDIOVERSION N/A 08/01/2018   Procedure: TRANSESOPHAGEAL ECHOCARDIOGRAM (TEE);  Surgeon: Grace Isaac, MD;  Location: [place];  Service: Open Heart Surgery;  Laterality: N/A;  . WISDOM TOOTH EXTRACTION      REVIEW OF SYSTEMS:  A comprehensive review of systems was negative except for: Respiratory: positive for dyspnea on exertion   PHYSICAL EXAMINATION: General appearance: alert, cooperative and no distress Head: Normocephalic, without obvious abnormality, atraumatic Neck: no adenopathy, no JVD, supple, symmetrical, trachea midline and thyroid not enlarged, symmetric, no tenderness/mass/nodules Lymph nodes: Cervical, supraclavicular, and axillary nodes normal. Resp: clear to auscultation bilaterally Back: symmetric, no curvature. ROM normal. No CVA tenderness. Cardio: regular rate and rhythm, S1, S2 normal, no murmur, click, rub or gallop GI: soft, non-tender; bowel sounds normal; no masses,  no organomegaly Extremities: extremities normal,  atraumatic, no cyanosis or edema  ECOG PERFORMANCE STATUS: 1 - Symptomatic but completely ambulatory  Blood pressure (!) 100/42, pulse (!) 58, temperature 98 F (36.7 C), temperature source Oral, resp. rate 18, height 5\' 11"  (1.803 m), weight 182 lb 3.2 oz (82.6 kg), SpO2 100 %.  LABORATORY DATA: Lab Results  Component Value Date   WBC 7.1 02/13/2019   HGB 11.2 (L) 02/13/2019   HCT 35.4 (L) 02/13/2019   MCV 93.2 02/13/2019   PLT 168 02/13/2019      Chemistry      Component Value Date/Time   NA 139 02/13/2019  1023   NA 126 (L) 08/24/2018 1237   K 4.4 02/13/2019 1023   CL 106 02/13/2019 1023   CO2 22 02/13/2019 1023   BUN 19 02/13/2019 1023   BUN 19 08/24/2018 1237   CREATININE 1.58 (H) 02/13/2019 1023   CREATININE 1.67 (H) 10/30/2016 1033      Component Value Date/Time   CALCIUM 9.1 02/13/2019 1023   ALKPHOS 83 02/13/2019 1023   AST 20 02/13/2019 1023   ALT 20 02/13/2019 1023   BILITOT 0.4 02/13/2019 1023       RADIOGRAPHIC STUDIES: No results found.  ASSESSMENT AND PLAN: This is a very pleasant 78 years old white male recently diagnosed with extensive stage small cell lung cancer.  The patient is here today for evaluation before starting the first cycle of his systemic chemotherapy with carboplatin, etoposide and Tecentriq.  Status post 8 cycles.  Starting from cycle #7 the patient has been on treatment with single agent Tecentriq. He continues to tolerate this treatment well with no concerning adverse effects. I recommended for him to proceed with cycle #9 today as scheduled. I will see him back for follow-up visit in 3 weeks for evaluation after repeating CT scan of the chest, abdomen and pelvis for restaging of his disease. The patient was advised to call immediately if he has any concerning symptoms in the interval. The patient voices understanding of current disease status and treatment options and is in agreement with the current care plan. All questions were  answered. The patient knows to call the clinic with any problems, questions or concerns. We can certainly see the patient much sooner if necessary.  Disclaimer: This note was dictated with voice recognition software. Similar sounding words can inadvertently be transcribed and may not be corrected upon review.

## 2019-02-13 NOTE — Progress Notes (Signed)
OK to treat -Per Julien Nordmann it is okay to treat pt today with tecentriq and creatinine of 1.53.

## 2019-02-22 ENCOUNTER — Other Ambulatory Visit: Payer: Self-pay | Admitting: Cardiovascular Disease

## 2019-02-22 DIAGNOSIS — J9611 Chronic respiratory failure with hypoxia: Secondary | ICD-10-CM | POA: Diagnosis not present

## 2019-03-06 ENCOUNTER — Inpatient Hospital Stay: Payer: Medicare HMO

## 2019-03-06 ENCOUNTER — Encounter: Payer: Self-pay | Admitting: Internal Medicine

## 2019-03-06 ENCOUNTER — Inpatient Hospital Stay: Payer: Medicare HMO | Attending: Internal Medicine | Admitting: Internal Medicine

## 2019-03-06 ENCOUNTER — Other Ambulatory Visit: Payer: Self-pay

## 2019-03-06 ENCOUNTER — Telehealth: Payer: Self-pay | Admitting: Internal Medicine

## 2019-03-06 VITALS — BP 103/55 | HR 61 | Temp 97.9°F | Resp 16 | Ht 71.0 in | Wt 181.0 lb

## 2019-03-06 DIAGNOSIS — R0602 Shortness of breath: Secondary | ICD-10-CM | POA: Diagnosis not present

## 2019-03-06 DIAGNOSIS — Z79899 Other long term (current) drug therapy: Secondary | ICD-10-CM | POA: Diagnosis not present

## 2019-03-06 DIAGNOSIS — C3411 Malignant neoplasm of upper lobe, right bronchus or lung: Secondary | ICD-10-CM

## 2019-03-06 DIAGNOSIS — E785 Hyperlipidemia, unspecified: Secondary | ICD-10-CM | POA: Diagnosis not present

## 2019-03-06 DIAGNOSIS — I255 Ischemic cardiomyopathy: Secondary | ICD-10-CM | POA: Diagnosis not present

## 2019-03-06 DIAGNOSIS — Z9221 Personal history of antineoplastic chemotherapy: Secondary | ICD-10-CM | POA: Insufficient documentation

## 2019-03-06 DIAGNOSIS — E119 Type 2 diabetes mellitus without complications: Secondary | ICD-10-CM | POA: Diagnosis not present

## 2019-03-06 DIAGNOSIS — C3490 Malignant neoplasm of unspecified part of unspecified bronchus or lung: Secondary | ICD-10-CM

## 2019-03-06 DIAGNOSIS — C3491 Malignant neoplasm of unspecified part of right bronchus or lung: Secondary | ICD-10-CM

## 2019-03-06 DIAGNOSIS — I480 Paroxysmal atrial fibrillation: Secondary | ICD-10-CM | POA: Insufficient documentation

## 2019-03-06 DIAGNOSIS — I110 Hypertensive heart disease with heart failure: Secondary | ICD-10-CM | POA: Diagnosis not present

## 2019-03-06 DIAGNOSIS — Z7982 Long term (current) use of aspirin: Secondary | ICD-10-CM | POA: Diagnosis not present

## 2019-03-06 DIAGNOSIS — Z5112 Encounter for antineoplastic immunotherapy: Secondary | ICD-10-CM | POA: Diagnosis not present

## 2019-03-06 DIAGNOSIS — I4891 Unspecified atrial fibrillation: Secondary | ICD-10-CM | POA: Diagnosis not present

## 2019-03-06 DIAGNOSIS — D649 Anemia, unspecified: Secondary | ICD-10-CM | POA: Diagnosis not present

## 2019-03-06 DIAGNOSIS — I2510 Atherosclerotic heart disease of native coronary artery without angina pectoris: Secondary | ICD-10-CM | POA: Diagnosis not present

## 2019-03-06 DIAGNOSIS — I252 Old myocardial infarction: Secondary | ICD-10-CM | POA: Diagnosis not present

## 2019-03-06 DIAGNOSIS — R5382 Chronic fatigue, unspecified: Secondary | ICD-10-CM

## 2019-03-06 DIAGNOSIS — I10 Essential (primary) hypertension: Secondary | ICD-10-CM

## 2019-03-06 LAB — CMP (CANCER CENTER ONLY)
ALT: 19 U/L (ref 0–44)
AST: 18 U/L (ref 15–41)
Albumin: 3.9 g/dL (ref 3.5–5.0)
Alkaline Phosphatase: 89 U/L (ref 38–126)
Anion gap: 11 (ref 5–15)
BUN: 31 mg/dL — ABNORMAL HIGH (ref 8–23)
CO2: 21 mmol/L — ABNORMAL LOW (ref 22–32)
Calcium: 9.3 mg/dL (ref 8.9–10.3)
Chloride: 105 mmol/L (ref 98–111)
Creatinine: 1.98 mg/dL — ABNORMAL HIGH (ref 0.61–1.24)
GFR, Est AFR Am: 36 mL/min — ABNORMAL LOW (ref >60)
GFR, Estimated: 31 mL/min — ABNORMAL LOW (ref >60)
Glucose, Bld: 149 mg/dL — ABNORMAL HIGH (ref 70–99)
Potassium: 4.7 mmol/L (ref 3.5–5.1)
Sodium: 137 mmol/L (ref 135–145)
Total Bilirubin: 0.4 mg/dL (ref 0.3–1.2)
Total Protein: 7.1 g/dL (ref 6.5–8.1)

## 2019-03-06 LAB — CBC WITH DIFFERENTIAL (CANCER CENTER ONLY)
Abs Immature Granulocytes: 0.01 10*3/uL (ref 0.00–0.07)
Basophils Absolute: 0.1 10*3/uL (ref 0.0–0.1)
Basophils Relative: 1 %
Eosinophils Absolute: 0.4 10*3/uL (ref 0.0–0.5)
Eosinophils Relative: 5 %
HCT: 34.6 % — ABNORMAL LOW (ref 39.0–52.0)
Hemoglobin: 11.2 g/dL — ABNORMAL LOW (ref 13.0–17.0)
Immature Granulocytes: 0 %
Lymphocytes Relative: 18 %
Lymphs Abs: 1.4 10*3/uL (ref 0.7–4.0)
MCH: 29.5 pg (ref 26.0–34.0)
MCHC: 32.4 g/dL (ref 30.0–36.0)
MCV: 91.1 fL (ref 80.0–100.0)
Monocytes Absolute: 0.8 10*3/uL (ref 0.1–1.0)
Monocytes Relative: 10 %
Neutro Abs: 5.2 10*3/uL (ref 1.7–7.7)
Neutrophils Relative %: 66 %
Platelet Count: 169 10*3/uL (ref 150–400)
RBC: 3.8 MIL/uL — ABNORMAL LOW (ref 4.22–5.81)
RDW: 13.9 % (ref 11.5–15.5)
WBC Count: 7.8 10*3/uL (ref 4.0–10.5)
nRBC: 0 % (ref 0.0–0.2)

## 2019-03-06 LAB — TSH: TSH: 0.395 u[IU]/mL (ref 0.320–4.118)

## 2019-03-06 MED ORDER — SODIUM CHLORIDE 0.9 % IV SOLN
1200.0000 mg | Freq: Once | INTRAVENOUS | Status: AC
  Administered 2019-03-06: 1200 mg via INTRAVENOUS
  Filled 2019-03-06: qty 20

## 2019-03-06 MED ORDER — SODIUM CHLORIDE 0.9 % IV SOLN
Freq: Once | INTRAVENOUS | Status: AC
  Administered 2019-03-06: 15:00:00 via INTRAVENOUS
  Filled 2019-03-06: qty 250

## 2019-03-06 NOTE — Progress Notes (Signed)
[facility] [phone]   Fax:(336) [place], [place] [address]  DIAGNOSIS: Extensive stage (T1 a, N2, M1a) extensive stage small cell lung cancer presented with right lung nodules in addition to mediastinal lymphadenopathy as well as pericardial metastasis diagnosed in October 2019  PRIOR THERAPY: None.  CURRENT THERAPY: palliative systemic chemotherapy with carboplatin for AUC of 5 on day 1, etoposide 100 mg/M2 on days 1, 2 and 3 in addition to Tecentriq (Atezolizumab) 1200 mg IV every 3 weeks.  Status post 9 cycles.  Starting from cycle #5 the patient will be treated with maintenance immunotherapy with Tecentriq 1200 mg IV every 3 weeks.  INTERVAL HISTORY: Gregory Bates 78 y.o. male returns to the clinic today for follow-up visit.  The patient is feeling fine with no concerning complaints except for the shortness of breath at baseline increased with exertion and he is still on home oxygen.  He denied having any chest pain, cough or hemoptysis.  He denied having any recent weight loss or night sweats.  He has no nausea, vomiting, diarrhea or constipation.  He has no headache or visual changes.  He continues to tolerate his treatment with Tecentriq fairly well.  The patient was supposed to have repeat CT scan of the chest, abdomen and pelvis before this visit but unfortunately his scan was not scheduled as ordered.   MEDICAL HISTORY: Past Medical History:  Diagnosis Date  . Acute renal insufficiency    a. During 11/2013 adm with atrial flutter.  . Anemia, unspecified   . Atrial fibrillation ([place])   . CAD (coronary artery disease), native coronary artery    a. s/p cath May 2011 >> DES distal RCA;  b. 01/2013 NSTEMI >> OM1 99p (2.75x12 Promus Premier DES);  c. LHC (2/15): PCI: Promus DES to dist RCA ;  d. LHC 1/16 - dLM 110, oLAD 30, small D1 tandem 24, pOM1 stent ok, mOM 50-60, dOM  bifurcation 50-60 in both vessels, AV 77-70 jailed by stent, pRCA 64 mRCA stent ok, dRCA stent ok, PLB smal 50, dPDA 60, 80; EF 35-40 >> med Rx - PCI of PDA if angina  . Chronic combined systolic and diastolic CHF (congestive heart failure) ([place]) 12/23/2015   Echo 2/17: Mild LVH, EF 45-50%, inferior akinesis, grade 2 diastolic dysfunction, mild AI, mild MR, mild LAE, PASP 44 mmHg   . Colorectal polyps 2002  . Diabetes mellitus    AODM  . Erectile dysfunction   . History of echocardiogram    a. 01/2013 Echo: EF 55%, mid-dist inflat HK, Gr1 DD, Triv AI/TR, Mild MR.;  b.  Echo (1/15): Mild LVH, EF 55%, inferolateral hypokinesis, grade 1 diastolic dysfunction, mild AI, trivial MR, moderate LAE, PASP 36 mmHg   . Hx of cardiovascular stress test    Stress myoview 08/07/13 with LVEF 44%, inferior scar, no ischemia  . Hyperlipidemia   . Hypertension   . Hyponatremia   . Ischemic cardiomyopathy    a. EF 35-40% by LHC in 1/16; b. Echo 2/17: Mild LVH, EF 45-50%, inferior akinesis, grade 2 diastolic dysfunction, mild AI, mild MR, mild LAE, PASP 44 mmHg  . Myocardial infarction ([place])   . Oxygen deficiency   . PAF (paroxysmal atrial fibrillation) ([place])    a. Dx 11/2013. Spont conv. Placed on eliquis.  . Small cell lung cancer in adult [facility]) 07/28/2018    ALLERGIES:  is allergic  to brilinta [ticagrelor] and crestor [rosuvastatin].  MEDICATIONS:  Current Outpatient Medications  Medication Sig Dispense Refill  . acetaminophen (TYLENOL) 325 MG tablet Take 2 tablets (650 mg total) by mouth every 6 (six) hours as needed for mild pain. (Patient not taking: Reported on 11/21/2018)    . albuterol (PROVENTIL HFA;VENTOLIN HFA) 108 (90 Base) MCG/ACT inhaler Inhale 2 puffs into the lungs every 6 (six) hours as needed for wheezing or shortness of breath. (Patient not taking: Reported on 11/21/2018) 1 Inhaler 2  . amiodarone (PACERONE) 200 MG tablet TAKE 1 TABLET (200 MG TOTAL) BY MOUTH DAILY. 90 tablet 1  . apixaban  (ELIQUIS) 5 MG TABS tablet Take 1 tablet (5 mg total) by mouth 2 (two) times daily. 180 tablet 2  . aspirin EC 81 MG EC tablet Take 1 tablet (81 mg total) by mouth daily.    [name] Kitchen atorvastatin (LIPITOR) 20 MG tablet TAKE 1 TABLET EVERY DAY 90 tablet 3  . cholecalciferol (VITAMIN D) 1000 UNITS tablet Take 1,000 Units by mouth every evening.     . clopidogrel (PLAVIX) 75 MG tablet TAKE 1 TABLET EVERY DAY WITH BREAKFAST 90 tablet 1  . fexofenadine (ALLEGRA) 180 MG tablet Take 180 mg by mouth daily as needed for allergies.     . fluticasone (FLONASE) 50 MCG/ACT nasal spray Place 1 spray into both nostrils daily as needed for allergies.     . furosemide (LASIX) 20 MG tablet TAKE 1 TABLET (20 MG TOTAL) BY MOUTH DAILY. 90 tablet 1  . furosemide (LASIX) 40 MG tablet Take 1 tablet (40 mg total) by mouth 2 (two) times daily. 60 tablet 3  . guaiFENesin (MUCINEX) 600 MG 12 hr tablet Take 1 tablet (600 mg total) by mouth 2 (two) times daily as needed for cough or to loosen phlegm.    . Iron Combinations (IRON COMPLEX PO) Take 1 tablet by mouth daily.    . isosorbide mononitrate (IMDUR) 60 MG 24 hr tablet TAKE 1 TABLET EVERY DAY 90 tablet 2  . metoprolol tartrate (LOPRESSOR) 25 MG tablet TAKE 1 TABLET TWICE DAILY 180 tablet 2  . Multiple Vitamin (MULTIVITAMIN) tablet Take 1 tablet by mouth at bedtime.     . ondansetron (ZOFRAN ODT) 4 MG disintegrating tablet Take 1 tablet (4 mg total) by mouth every 8 (eight) hours as needed for nausea or vomiting. (Patient not taking: Reported on 11/21/2018) 20 tablet 0  . prochlorperazine (COMPAZINE) 10 MG tablet Take 1 tablet (10 mg total) by mouth every 6 (six) hours as needed for nausea or vomiting. (Patient not taking: Reported on 11/21/2018) 30 tablet 0  . vitamin E 100 UNIT capsule Take 100 Units by mouth at bedtime.      No current facility-administered medications for this visit.     SURGICAL HISTORY:  Past Surgical History:  Procedure Laterality Date  . BIOPSY   08/01/2018   Procedure: BIOPSY OF PERICARDIAL LYMPH NODE AND HILAR LYMPH NODE;  Surgeon: Grace Isaac, MD;  Location: [facility];  Service: Open Heart Surgery;;  . CLIPPING OF ATRIAL APPENDAGE Left 08/01/2018   Procedure: CLIPPING OF ATRIAL APPENDAGE;  Surgeon: Grace Isaac, MD;  Location: [facility];  Service: Open Heart Surgery;  Laterality: Left;  . COLONOSCOPY W/ POLYPECTOMY  2002   [facility]  . COLONOSCOPY W/ POLYPECTOMY  2004; 2007 negative  . CORONARY ANGIOPLASTY  12/26/2013  . CORONARY ARTERY BYPASS GRAFT N/A 08/01/2018   Procedure: CORONARY ARTERY BYPASS GRAFTING (CABG) TIMES THREE USING LEFT  INTERNAL MAMMARY ARTERY TO LAD, RIGHT GREATER SAPHENOUS VEIN GRAFT TO OM AND RCA, VEIN HARVESTED ENDOSCOPICALLY;  Surgeon: Grace Isaac, MD;  Location: [facility];  Service: Open Heart Surgery;  Laterality: N/A;  . CORONARY STENT PLACEMENT  2007, 2011  . infantile paralysis facial asymmetry    . LEFT HEART CATH AND CORONARY ANGIOGRAPHY N/A 05/18/2017   Procedure: Left Heart Cath and Coronary Angiography;  Surgeon: Martinique, Peter M, MD;  Location: [facility];  Service: Cardiovascular;  Laterality: N/A;  . LEFT HEART CATH AND CORONARY ANGIOGRAPHY N/A 07/27/2018   Procedure: LEFT HEART CATH AND CORONARY ANGIOGRAPHY;  Surgeon: Martinique, Peter M, MD;  Location: [facility];  Service: Cardiovascular;  Laterality: N/A;  . LEFT HEART CATHETERIZATION WITH CORONARY ANGIOGRAM N/A 10/02/2012   Procedure: LEFT HEART CATHETERIZATION WITH CORONARY ANGIOGRAM;  Surgeon: Peter M Martinique, MD;  Location: [facility] CATH LAB;  Service: Cardiovascular;  Laterality: N/A;  . LEFT HEART CATHETERIZATION WITH CORONARY ANGIOGRAM N/A 02/20/2013   Procedure: LEFT HEART CATHETERIZATION WITH CORONARY ANGIOGRAM;  Surgeon: Burnell Blanks, MD;  Location: [facility] CATH LAB;  Service: Cardiovascular;  Laterality: N/A;  . LEFT HEART CATHETERIZATION WITH CORONARY ANGIOGRAM N/A 12/26/2013   Procedure: LEFT HEART CATHETERIZATION WITH  CORONARY ANGIOGRAM;  Surgeon: Burnell Blanks, MD;  Location: [facility] CATH LAB;  Service: Cardiovascular;  Laterality: N/A;  . LEFT HEART CATHETERIZATION WITH CORONARY ANGIOGRAM N/A 11/28/2014   Procedure: LEFT HEART CATHETERIZATION WITH CORONARY ANGIOGRAM;  Surgeon: Burnell Blanks, MD;  Location: [facility] CATH LAB;  Service: Cardiovascular;  Laterality: N/A;  . PERCUTANEOUS CORONARY STENT INTERVENTION (PCI-S)  10/02/2012   Procedure: PERCUTANEOUS CORONARY STENT INTERVENTION (PCI-S);  Surgeon: Peter M Martinique, MD;  Location: [facility] CATH LAB;  Service: Cardiovascular;;  . PERCUTANEOUS CORONARY STENT INTERVENTION (PCI-S)  02/20/2013   Procedure: PERCUTANEOUS CORONARY STENT INTERVENTION (PCI-S);  Surgeon: Burnell Blanks, MD;  Location: [facility] CATH LAB;  Service: Cardiovascular;;  . PERCUTANEOUS CORONARY STENT INTERVENTION (PCI-S)  12/26/2013   Procedure: PERCUTANEOUS CORONARY STENT INTERVENTION (PCI-S);  Surgeon: Burnell Blanks, MD;  Location: [facility] CATH LAB;  Service: Cardiovascular;;  . TEE WITHOUT CARDIOVERSION N/A 08/01/2018   Procedure: TRANSESOPHAGEAL ECHOCARDIOGRAM (TEE);  Surgeon: Grace Isaac, MD;  Location: [facility];  Service: Open Heart Surgery;  Laterality: N/A;  . WISDOM TOOTH EXTRACTION      REVIEW OF SYSTEMS:  A comprehensive review of systems was negative except for: Respiratory: positive for dyspnea on exertion   PHYSICAL EXAMINATION: General appearance: alert, cooperative and no distress Head: Normocephalic, without obvious abnormality, atraumatic Neck: no adenopathy, no JVD, supple, symmetrical, trachea midline and thyroid not enlarged, symmetric, no tenderness/mass/nodules Lymph nodes: Cervical, supraclavicular, and axillary nodes normal. Resp: clear to auscultation bilaterally Back: symmetric, no curvature. ROM normal. No CVA tenderness. Cardio: regular rate and rhythm, S1, S2 normal, no murmur, click, rub or gallop GI: soft, non-tender; bowel sounds normal; no masses,   no organomegaly Extremities: extremities normal, atraumatic, no cyanosis or edema  ECOG PERFORMANCE STATUS: 1 - Symptomatic but completely ambulatory  Blood pressure (!) 103/55, pulse 61, temperature 97.9 F (36.6 C), temperature source Oral, resp. rate 16, height 5\' 11"  (1.803 m), weight 181 lb (82.1 kg), SpO2 100 %.  LABORATORY DATA: Lab Results  Component Value Date   WBC 7.1 02/13/2019   HGB 11.2 (L) 02/13/2019   HCT 35.4 (L) 02/13/2019   MCV 93.2 02/13/2019   PLT 168 02/13/2019      Chemistry  Component Value Date/Time   NA 139 02/13/2019 1023   NA 126 (L) 08/24/2018 1237   K 4.4 02/13/2019 1023   CL 106 02/13/2019 1023   CO2 22 02/13/2019 1023   BUN 19 02/13/2019 1023   BUN 19 08/24/2018 1237   CREATININE 1.58 (H) 02/13/2019 1023   CREATININE 1.67 (H) 10/30/2016 1033      Component Value Date/Time   CALCIUM 9.1 02/13/2019 1023   ALKPHOS 83 02/13/2019 1023   AST 20 02/13/2019 1023   ALT 20 02/13/2019 1023   BILITOT 0.4 02/13/2019 1023       RADIOGRAPHIC STUDIES: No results found.  ASSESSMENT AND PLAN: This is a very pleasant 78 years old white male recently diagnosed with extensive stage small cell lung cancer.  The patient is here today for evaluation before starting the first cycle of his systemic chemotherapy with carboplatin, etoposide and Tecentriq.  Status post 9 cycles.  Starting from cycle #7 the patient has been on treatment with single agent Tecentriq. He has been tolerating this treatment well with no concerning adverse effects. I recommended for the patient to proceed with cycle #10 today. I will see him back for follow-up visit in 3 weeks for evaluation with repeat CT scan of the chest, abdomen and pelvis for restaging of his disease. He was advised to call immediately if he has any concerning symptoms in the interval. The patient voices understanding of current disease status and treatment options and is in agreement with the current care plan.  All questions were answered. The patient knows to call the clinic with any problems, questions or concerns. We can certainly see the patient much sooner if necessary.  Disclaimer: This note was dictated with voice recognition software. Similar sounding words can inadvertently be transcribed and may not be corrected upon review.

## 2019-03-06 NOTE — Progress Notes (Signed)
Per Dr Julien Nordmann OK for treatment with CRT of 1.98

## 2019-03-06 NOTE — Telephone Encounter (Signed)
Added an additional cycle per 5/4 los - sent message to central radiology to r/s ct scan

## 2019-03-06 NOTE — Patient Instructions (Signed)
Gregory Bates Discharge Instructions for Patients Receiving Chemotherapy  Today you received the following chemotherapy agents:  atezolizumab Gregory Bates Bates)  To help prevent nausea and vomiting after your treatment, we encourage you to take your nausea medication as prescribed.   If you develop nausea and vomiting that is not controlled by your nausea medication, call the clinic.   BELOW ARE SYMPTOMS THAT SHOULD BE REPORTED IMMEDIATELY:  *FEVER GREATER THAN 100.5 F  *CHILLS WITH OR WITHOUT FEVER  NAUSEA AND VOMITING THAT IS NOT CONTROLLED WITH YOUR NAUSEA MEDICATION  *UNUSUAL SHORTNESS OF BREATH  *UNUSUAL BRUISING OR BLEEDING  TENDERNESS IN MOUTH AND THROAT WITH OR WITHOUT PRESENCE OF ULCERS  *URINARY PROBLEMS  *BOWEL PROBLEMS  UNUSUAL RASH Items with * indicate a potential emergency and should be followed up as soon as possible.  Feel free to call the clinic should you have any questions or concerns. The clinic phone number is [phone].  Please show the [place] at check-in to the Emergency Department and triage nurse.

## 2019-03-24 DIAGNOSIS — J9611 Chronic respiratory failure with hypoxia: Secondary | ICD-10-CM | POA: Diagnosis not present

## 2019-03-29 ENCOUNTER — Inpatient Hospital Stay: Payer: Medicare HMO

## 2019-03-29 ENCOUNTER — Other Ambulatory Visit: Payer: Self-pay

## 2019-03-29 ENCOUNTER — Encounter: Payer: Self-pay | Admitting: Physician Assistant

## 2019-03-29 ENCOUNTER — Inpatient Hospital Stay (HOSPITAL_BASED_OUTPATIENT_CLINIC_OR_DEPARTMENT_OTHER): Payer: Medicare HMO | Admitting: Physician Assistant

## 2019-03-29 VITALS — BP 109/42 | HR 58 | Temp 98.2°F | Resp 18 | Ht 71.0 in | Wt 176.6 lb

## 2019-03-29 DIAGNOSIS — D649 Anemia, unspecified: Secondary | ICD-10-CM | POA: Diagnosis not present

## 2019-03-29 DIAGNOSIS — C3491 Malignant neoplasm of unspecified part of right bronchus or lung: Secondary | ICD-10-CM

## 2019-03-29 DIAGNOSIS — Z5112 Encounter for antineoplastic immunotherapy: Secondary | ICD-10-CM

## 2019-03-29 DIAGNOSIS — R5382 Chronic fatigue, unspecified: Secondary | ICD-10-CM

## 2019-03-29 DIAGNOSIS — C3411 Malignant neoplasm of upper lobe, right bronchus or lung: Secondary | ICD-10-CM

## 2019-03-29 DIAGNOSIS — E785 Hyperlipidemia, unspecified: Secondary | ICD-10-CM | POA: Diagnosis not present

## 2019-03-29 DIAGNOSIS — E119 Type 2 diabetes mellitus without complications: Secondary | ICD-10-CM | POA: Diagnosis not present

## 2019-03-29 DIAGNOSIS — E1159 Type 2 diabetes mellitus with other circulatory complications: Secondary | ICD-10-CM

## 2019-03-29 DIAGNOSIS — C3490 Malignant neoplasm of unspecified part of unspecified bronchus or lung: Secondary | ICD-10-CM | POA: Diagnosis not present

## 2019-03-29 DIAGNOSIS — I110 Hypertensive heart disease with heart failure: Secondary | ICD-10-CM | POA: Diagnosis not present

## 2019-03-29 DIAGNOSIS — I2510 Atherosclerotic heart disease of native coronary artery without angina pectoris: Secondary | ICD-10-CM | POA: Diagnosis not present

## 2019-03-29 DIAGNOSIS — R0602 Shortness of breath: Secondary | ICD-10-CM | POA: Diagnosis not present

## 2019-03-29 DIAGNOSIS — I255 Ischemic cardiomyopathy: Secondary | ICD-10-CM | POA: Diagnosis not present

## 2019-03-29 LAB — CBC WITH DIFFERENTIAL (CANCER CENTER ONLY)
Abs Immature Granulocytes: 0.03 10*3/uL (ref 0.00–0.07)
Basophils Absolute: 0.1 10*3/uL (ref 0.0–0.1)
Basophils Relative: 1 %
Eosinophils Absolute: 0.6 10*3/uL — ABNORMAL HIGH (ref 0.0–0.5)
Eosinophils Relative: 6 %
HCT: 36 % — ABNORMAL LOW (ref 39.0–52.0)
Hemoglobin: 11.7 g/dL — ABNORMAL LOW (ref 13.0–17.0)
Immature Granulocytes: 0 %
Lymphocytes Relative: 14 %
Lymphs Abs: 1.4 10*3/uL (ref 0.7–4.0)
MCH: 29 pg (ref 26.0–34.0)
MCHC: 32.5 g/dL (ref 30.0–36.0)
MCV: 89.3 fL (ref 80.0–100.0)
Monocytes Absolute: 0.8 10*3/uL (ref 0.1–1.0)
Monocytes Relative: 8 %
Neutro Abs: 7.2 10*3/uL (ref 1.7–7.7)
Neutrophils Relative %: 71 %
Platelet Count: 225 10*3/uL (ref 150–400)
RBC: 4.03 MIL/uL — ABNORMAL LOW (ref 4.22–5.81)
RDW: 13.1 % (ref 11.5–15.5)
WBC Count: 10.2 10*3/uL (ref 4.0–10.5)
nRBC: 0 % (ref 0.0–0.2)

## 2019-03-29 LAB — TSH: TSH: 0.664 u[IU]/mL (ref 0.320–4.118)

## 2019-03-29 LAB — CMP (CANCER CENTER ONLY)
ALT: 19 U/L (ref 0–44)
AST: 12 U/L — ABNORMAL LOW (ref 15–41)
Albumin: 3.5 g/dL (ref 3.5–5.0)
Alkaline Phosphatase: 115 U/L (ref 38–126)
Anion gap: 9 (ref 5–15)
BUN: 28 mg/dL — ABNORMAL HIGH (ref 8–23)
CO2: 24 mmol/L (ref 22–32)
Calcium: 9.1 mg/dL (ref 8.9–10.3)
Chloride: 97 mmol/L — ABNORMAL LOW (ref 98–111)
Creatinine: 1.88 mg/dL — ABNORMAL HIGH (ref 0.61–1.24)
GFR, Est AFR Am: 39 mL/min — ABNORMAL LOW (ref >60)
GFR, Estimated: 33 mL/min — ABNORMAL LOW (ref >60)
Glucose, Bld: 545 mg/dL — ABNORMAL HIGH (ref 70–99)
Potassium: 4.6 mmol/L (ref 3.5–5.1)
Sodium: 130 mmol/L — ABNORMAL LOW (ref 135–145)
Total Bilirubin: 0.5 mg/dL (ref 0.3–1.2)
Total Protein: 7.1 g/dL (ref 6.5–8.1)

## 2019-03-29 LAB — GLUCOSE, CAPILLARY
Glucose-Capillary: 420 mg/dL — ABNORMAL HIGH (ref 70–99)
Glucose-Capillary: 406 mg/dL — ABNORMAL HIGH (ref 70–99)

## 2019-03-29 MED ORDER — INSULIN REGULAR HUMAN 100 UNIT/ML IJ SOLN
15.0000 [IU] | Freq: Once | INTRAMUSCULAR | Status: AC
  Administered 2019-03-29: 14:00:00 15 [IU] via SUBCUTANEOUS
  Filled 2019-03-29: qty 10

## 2019-03-29 MED ORDER — SODIUM CHLORIDE 0.9 % IV SOLN
Freq: Once | INTRAVENOUS | Status: AC
  Administered 2019-03-29: 14:00:00 via INTRAVENOUS
  Filled 2019-03-29: qty 250

## 2019-03-29 MED ORDER — SODIUM CHLORIDE 0.9 % IV SOLN
1200.0000 mg | Freq: Once | INTRAVENOUS | Status: AC
  Administered 2019-03-29: 14:00:00 1200 mg via INTRAVENOUS
  Filled 2019-03-29: qty 20

## 2019-03-29 NOTE — Patient Instructions (Signed)
[facility] Discharge Instructions for Patients Receiving Chemotherapy  Today you received the following chemotherapy agents:  atezolizumab [name])  To help prevent nausea and vomiting after your treatment, we encourage you to take your nausea medication as prescribed.   If you develop nausea and vomiting that is not controlled by your nausea medication, call the clinic.   BELOW ARE SYMPTOMS THAT SHOULD BE REPORTED IMMEDIATELY:  *FEVER GREATER THAN 100.5 F  *CHILLS WITH OR WITHOUT FEVER  NAUSEA AND VOMITING THAT IS NOT CONTROLLED WITH YOUR NAUSEA MEDICATION  *UNUSUAL SHORTNESS OF BREATH  *UNUSUAL BRUISING OR BLEEDING  TENDERNESS IN MOUTH AND THROAT WITH OR WITHOUT PRESENCE OF ULCERS  *URINARY PROBLEMS  *BOWEL PROBLEMS  UNUSUAL RASH Items with * indicate a potential emergency and should be followed up as soon as possible.  Feel free to call the clinic should you have any questions or concerns. The clinic phone number is [phone].  Please show the [place] at check-in to the Emergency Department and triage nurse.

## 2019-03-29 NOTE — Progress Notes (Signed)
Per PA Heilingoetter, give insulin then recheck CBG in 1 hour.

## 2019-03-29 NOTE — Progress Notes (Signed)
[facility] OFFICE PROGRESS NOTE  Street, Sharon Mt, MD [place] [address]  DIAGNOSIS: Extensive stage (T1 a, N2, M1a)extensive stage small cell lung cancer presented with right lung nodules in addition to mediastinal lymphadenopathy as well as pericardial metastasis diagnosed in October 2019  PRIOR THERAPY: None  CURRENT THERAPY: Palliative systemic chemotherapy with carboplatin for AUC of 5 on day 1, etoposide 100 mg/M2 on days 1, 2 and 3 in addition to Tecentriq (Atezolizumab) 1200 mg IV every 3 weeks.  Status post 10 cycles. Starting from cycle #5 the patient will be treated with maintenance immunotherapy with Tecentriq 1200 mg IV every 3 weeks.  INTERVAL HISTORY: Gregory Bates 78 y.o. male returns to the clinic for a follow-up visit.  The patient is feeling fatigued today.  He states that he has felt more fatigued than usual for approximately 1 week.  He denies any fever, chills, or night sweats. He last lost about 4lbs since his last appointment despite eating and taking his medications, such as lasixs per his usual habits. He denies any chest pain or hemoptysis.  He endorses his baseline shortness of breath on 3L of oxygen.  He also has a nonproductive cough.  He denies any sore throat, nausea, vomiting, diarrhea, or constipation. He denies polydipsia or polyuria. He denies any headaches but states that he occasionally has blurry vision.  He tried to make an appointment with his eye doctor but was unable to get an appointment.  He denies any rashes or skin changes.  He was supposed to have a restaging CT scan performed but unfortunately it could not be scheduled before his visit today.  He scheduled to obtain his restaging CT scan on April 14, 2019.  He is here today for evaluation before starting cycle #11.  MEDICAL HISTORY: Past Medical History:  Diagnosis Date  . Acute renal insufficiency    a. During 11/2013 adm with atrial flutter.  . Anemia,  unspecified   . Atrial fibrillation ([place])   . CAD (coronary artery disease), native coronary artery    a. s/p cath May 2011 >> DES distal RCA;  b. 01/2013 NSTEMI >> OM1 99p (2.75x12 Promus Premier DES);  c. LHC (2/15): PCI: Promus DES to dist RCA ;  d. LHC 1/16 - dLM 70, oLAD 30, small D1 tandem 4, pOM1 stent ok, mOM 50-60, dOM bifurcation 50-60 in both vessels, AV 63-70 jailed by stent, pRCA 85 mRCA stent ok, dRCA stent ok, PLB smal 50, dPDA 60, 80; EF 35-40 >> med Rx - PCI of PDA if angina  . Chronic combined systolic and diastolic CHF (congestive heart failure) ([place]) 12/23/2015   Echo 2/17: Mild LVH, EF 45-50%, inferior akinesis, grade 2 diastolic dysfunction, mild AI, mild MR, mild LAE, PASP 44 mmHg   . Colorectal polyps 2002  . Diabetes mellitus    AODM  . Erectile dysfunction   . History of echocardiogram    a. 01/2013 Echo: EF 55%, mid-dist inflat HK, Gr1 DD, Triv AI/TR, Mild MR.;  b.  Echo (1/15): Mild LVH, EF 55%, inferolateral hypokinesis, grade 1 diastolic dysfunction, mild AI, trivial MR, moderate LAE, PASP 36 mmHg   . Hx of cardiovascular stress test    Stress myoview 08/07/13 with LVEF 44%, inferior scar, no ischemia  . Hyperlipidemia   . Hypertension   . Hyponatremia   . Ischemic cardiomyopathy    a. EF 35-40% by LHC in 1/16; b. Echo 2/17: Mild LVH, EF 45-50%, inferior  akinesis, grade 2 diastolic dysfunction, mild AI, mild MR, mild LAE, PASP 44 mmHg  . Myocardial infarction ([place])   . Oxygen deficiency   . PAF (paroxysmal atrial fibrillation) ([place])    a. Dx 11/2013. Spont conv. Placed on eliquis.  . Small cell lung cancer in adult [facility]) 07/28/2018    ALLERGIES:  is allergic to brilinta [ticagrelor] and crestor [rosuvastatin].  MEDICATIONS:  Current Outpatient Medications  Medication Sig Dispense Refill  . acetaminophen (TYLENOL) 325 MG tablet Take 2 tablets (650 mg total) by mouth every 6 (six) hours as needed for mild pain. (Patient not taking: Reported on 11/21/2018)    .  albuterol (PROVENTIL HFA;VENTOLIN HFA) 108 (90 Base) MCG/ACT inhaler Inhale 2 puffs into the lungs every 6 (six) hours as needed for wheezing or shortness of breath. (Patient not taking: Reported on 11/21/2018) 1 Inhaler 2  . amiodarone (PACERONE) 200 MG tablet TAKE 1 TABLET (200 MG TOTAL) BY MOUTH DAILY. 90 tablet 1  . apixaban (ELIQUIS) 5 MG TABS tablet Take 1 tablet (5 mg total) by mouth 2 (two) times daily. 180 tablet 2  . aspirin EC 81 MG EC tablet Take 1 tablet (81 mg total) by mouth daily.    [name] Kitchen atorvastatin (LIPITOR) 20 MG tablet TAKE 1 TABLET EVERY DAY 90 tablet 3  . cholecalciferol (VITAMIN D) 1000 UNITS tablet Take 1,000 Units by mouth every evening.     . clopidogrel (PLAVIX) 75 MG tablet TAKE 1 TABLET EVERY DAY WITH BREAKFAST 90 tablet 1  . fexofenadine (ALLEGRA) 180 MG tablet Take 180 mg by mouth daily as needed for allergies.     . fluticasone (FLONASE) 50 MCG/ACT nasal spray Place 1 spray into both nostrils daily as needed for allergies.     . furosemide (LASIX) 20 MG tablet TAKE 1 TABLET (20 MG TOTAL) BY MOUTH DAILY. 90 tablet 1  . furosemide (LASIX) 40 MG tablet Take 1 tablet (40 mg total) by mouth 2 (two) times daily. 60 tablet 3  . guaiFENesin (MUCINEX) 600 MG 12 hr tablet Take 1 tablet (600 mg total) by mouth 2 (two) times daily as needed for cough or to loosen phlegm.    . Iron Combinations (IRON COMPLEX PO) Take 1 tablet by mouth daily.    . isosorbide mononitrate (IMDUR) 60 MG 24 hr tablet TAKE 1 TABLET EVERY DAY 90 tablet 2  . metoprolol tartrate (LOPRESSOR) 25 MG tablet TAKE 1 TABLET TWICE DAILY 180 tablet 2  . Multiple Vitamin (MULTIVITAMIN) tablet Take 1 tablet by mouth at bedtime.     . ondansetron (ZOFRAN ODT) 4 MG disintegrating tablet Take 1 tablet (4 mg total) by mouth every 8 (eight) hours as needed for nausea or vomiting. (Patient not taking: Reported on 11/21/2018) 20 tablet 0  . prochlorperazine (COMPAZINE) 10 MG tablet Take 1 tablet (10 mg total) by mouth every 6  (six) hours as needed for nausea or vomiting. (Patient not taking: Reported on 11/21/2018) 30 tablet 0  . vitamin E 100 UNIT capsule Take 100 Units by mouth at bedtime.      No current facility-administered medications for this visit.     SURGICAL HISTORY:  Past Surgical History:  Procedure Laterality Date  . BIOPSY  08/01/2018   Procedure: BIOPSY OF PERICARDIAL LYMPH NODE AND HILAR LYMPH NODE;  Surgeon: Grace Isaac, MD;  Location: [facility] OR;  Service: Open Heart Surgery;;  . CLIPPING OF ATRIAL APPENDAGE Left 08/01/2018   Procedure: CLIPPING OF ATRIAL APPENDAGE;  Surgeon: Lanelle Bal  B, MD;  Location: [facility];  Service: Open Heart Surgery;  Laterality: Left;  . COLONOSCOPY W/ POLYPECTOMY  2002   [facility]  . COLONOSCOPY W/ POLYPECTOMY  2004; 2007 negative  . CORONARY ANGIOPLASTY  12/26/2013  . CORONARY ARTERY BYPASS GRAFT N/A 08/01/2018   Procedure: CORONARY ARTERY BYPASS GRAFTING (CABG) TIMES THREE USING LEFT INTERNAL MAMMARY ARTERY TO LAD, RIGHT GREATER SAPHENOUS VEIN GRAFT TO OM AND RCA, VEIN HARVESTED ENDOSCOPICALLY;  Surgeon: Grace Isaac, MD;  Location: [facility];  Service: Open Heart Surgery;  Laterality: N/A;  . CORONARY STENT PLACEMENT  2007, 2011  . infantile paralysis facial asymmetry    . LEFT HEART CATH AND CORONARY ANGIOGRAPHY N/A 05/18/2017   Procedure: Left Heart Cath and Coronary Angiography;  Surgeon: Martinique, Peter M, MD;  Location: [facility] LAB;  Service: Cardiovascular;  Laterality: N/A;  . LEFT HEART CATH AND CORONARY ANGIOGRAPHY N/A 07/27/2018   Procedure: LEFT HEART CATH AND CORONARY ANGIOGRAPHY;  Surgeon: Martinique, Peter M, MD;  Location: [facility];  Service: Cardiovascular;  Laterality: N/A;  . LEFT HEART CATHETERIZATION WITH CORONARY ANGIOGRAM N/A 10/02/2012   Procedure: LEFT HEART CATHETERIZATION WITH CORONARY ANGIOGRAM;  Surgeon: Peter M Martinique, MD;  Location: [facility] CATH LAB;  Service: Cardiovascular;  Laterality: N/A;  . LEFT HEART CATHETERIZATION  WITH CORONARY ANGIOGRAM N/A 02/20/2013   Procedure: LEFT HEART CATHETERIZATION WITH CORONARY ANGIOGRAM;  Surgeon: Burnell Blanks, MD;  Location: [facility] CATH LAB;  Service: Cardiovascular;  Laterality: N/A;  . LEFT HEART CATHETERIZATION WITH CORONARY ANGIOGRAM N/A 12/26/2013   Procedure: LEFT HEART CATHETERIZATION WITH CORONARY ANGIOGRAM;  Surgeon: Burnell Blanks, MD;  Location: [facility] CATH LAB;  Service: Cardiovascular;  Laterality: N/A;  . LEFT HEART CATHETERIZATION WITH CORONARY ANGIOGRAM N/A 11/28/2014   Procedure: LEFT HEART CATHETERIZATION WITH CORONARY ANGIOGRAM;  Surgeon: Burnell Blanks, MD;  Location: [facility] CATH LAB;  Service: Cardiovascular;  Laterality: N/A;  . PERCUTANEOUS CORONARY STENT INTERVENTION (PCI-S)  10/02/2012   Procedure: PERCUTANEOUS CORONARY STENT INTERVENTION (PCI-S);  Surgeon: Peter M Martinique, MD;  Location: [facility] CATH LAB;  Service: Cardiovascular;;  . PERCUTANEOUS CORONARY STENT INTERVENTION (PCI-S)  02/20/2013   Procedure: PERCUTANEOUS CORONARY STENT INTERVENTION (PCI-S);  Surgeon: Burnell Blanks, MD;  Location: [facility] CATH LAB;  Service: Cardiovascular;;  . PERCUTANEOUS CORONARY STENT INTERVENTION (PCI-S)  12/26/2013   Procedure: PERCUTANEOUS CORONARY STENT INTERVENTION (PCI-S);  Surgeon: Burnell Blanks, MD;  Location: [facility] CATH LAB;  Service: Cardiovascular;;  . TEE WITHOUT CARDIOVERSION N/A 08/01/2018   Procedure: TRANSESOPHAGEAL ECHOCARDIOGRAM (TEE);  Surgeon: Grace Isaac, MD;  Location: [facility];  Service: Open Heart Surgery;  Laterality: N/A;  . WISDOM TOOTH EXTRACTION      REVIEW OF SYSTEMS:   Review of Systems  Constitutional: Negative for appetite change, chills, fatigue, fever and unexpected weight change.  HENT:   Negative for mouth sores, nosebleeds, sore throat and trouble swallowing.   Eyes: Negative for eye problems and icterus.  Respiratory: Negative for cough, hemoptysis, shortness of breath and wheezing.   Cardiovascular: Negative  for chest pain and leg swelling.  Gastrointestinal: Negative for abdominal pain, constipation, diarrhea, nausea and vomiting.  Genitourinary: Negative for bladder incontinence, difficulty urinating, dysuria, frequency and hematuria.   Musculoskeletal: Negative for back pain, gait problem, neck pain and neck stiffness.  Skin: Negative for itching and rash.  Neurological: Negative for dizziness, extremity weakness, gait problem, headaches, light-headedness and seizures.  Hematological: Negative for adenopathy. Does not bruise/bleed easily.  Psychiatric/Behavioral: Negative for  confusion, depression and sleep disturbance. The patient is not nervous/anxious.     PHYSICAL EXAMINATION:  Blood pressure (!) 109/42, pulse (!) 58, temperature 98.2 F (36.8 C), temperature source Oral, resp. rate 18, height 5\' 11"  (1.803 m), weight 176 lb 9.6 oz (80.1 kg), SpO2 100 %.  ECOG PERFORMANCE STATUS: 1 - Symptomatic but completely ambulatory  Physical Exam  Constitutional: Oriented to person, place, and time and well-developed, well-nourished, and in no distress. No distress.  HENT:  Head: Normocephalic and atraumatic.  Mouth/Throat: Oropharynx is clear and moist. No oropharyngeal exudate.  Eyes: Conjunctivae are normal. Right eye exhibits no discharge. Left eye exhibits no discharge. No scleral icterus.  Neck: Normal range of motion. Neck supple.  Cardiovascular: Normal rate, regular rhythm, normal heart sounds and intact distal pulses.   Pulmonary/Chest: Effort normal and breath sounds normal. No respiratory distress. No wheezes. No rales.  Abdominal: Soft. Bowel sounds are normal. Exhibits no distension and no mass. There is no tenderness.  Musculoskeletal: Normal range of motion. Exhibits no edema.  Lymphadenopathy:    No cervical adenopathy.  Neurological: Alert and oriented to person, place, and time. Exhibits normal muscle tone. Gait normal. Coordination normal.  Skin: Skin is warm and dry. No  rash noted. Not diaphoretic. No erythema. No pallor.  Psychiatric: Mood, memory and judgment normal.  Vitals reviewed.  LABORATORY DATA: Lab Results  Component Value Date   WBC 10.2 03/29/2019   HGB 11.7 (L) 03/29/2019   HCT 36.0 (L) 03/29/2019   MCV 89.3 03/29/2019   PLT 225 03/29/2019      Chemistry      Component Value Date/Time   NA 130 (L) 03/29/2019 1118   NA 126 (L) 08/24/2018 1237   K 4.6 03/29/2019 1118   CL 97 (L) 03/29/2019 1118   CO2 24 03/29/2019 1118   BUN 28 (H) 03/29/2019 1118   BUN 19 08/24/2018 1237   CREATININE 1.88 (H) 03/29/2019 1118   CREATININE 1.67 (H) 10/30/2016 1033      Component Value Date/Time   CALCIUM 9.1 03/29/2019 1118   ALKPHOS 115 03/29/2019 1118   AST 12 (L) 03/29/2019 1118   ALT 19 03/29/2019 1118   BILITOT 0.5 03/29/2019 1118       RADIOGRAPHIC STUDIES:  No results found.   ASSESSMENT/PLAN:  This is a very pleasant 78 year old Caucasian male diagnosed with extensive stage small cell lung cancer.  He presented with right lung nodules in addition to mediastinal lymphadenopathy as well as a pericardial metastasis.  He was diagnosed in October 2019.  The patient previously underwent treatment with carboplatin, etoposide, and Tecentriq.  He is status post 10 cycles. Starting from cycle #7, he has been undergoing maintenance with Tecentriq 1200 mg IV every 3 weeks.  He has been tolerating it well without any adverse effects.  The patient unfortunately did not receive a restaging CT scan.  It appears that it is scheduled for April 14, 2019 which is before his next appointment.  The patient was seen with Dr. Julien Nordmann today. We recommend that the patient proceed with cycle #11 today as scheduled.   He will proceed with his restaging CT scan on April 14, 2019 as scheduled.   We will see the patient back for a follow up visit in 3 weeks for evaluation and to review his scan results before starting cycle #12.   The patient's blood sugar  was 545 today on routine labs.  The patient states that he has been eating "candy all  night".  Per chart review, the patient previously diagnosed with diabetes; however, he does not take any medication for this.  He was previously on metformin but this was discontinued due to his blood sugars being well controlled. The patient is on immunotherapy which can also lead to the development of type I diabetes. This cannot be excluded.  The patient was given 15 units of insulin while in the clinic today. He was strongly urged to contact his PCP immediately regarding evaluation of his blood sugars. He has a glucometer at home and was instructed to check his blood sugars a couple times a day.  I discussed signs and symptoms to monitor for during his appointment today such as nausea, abdominal pain, vomiting, altered mental status, polydipsia, and polyuria. His elevated blood sugars are likely contributing to his relatively low blood pressure for him today as well as his blurry vision. He was instructed to not consume sugary food and candy.  The patient's blood sugar continued to be high at 406 upon recheck 1 hour after receiving insulin. The patient was adamant on leaving after his infusion before we could discuss further interventions for his blood sugar. He reportedly stated that he "knows what I need to do". I will call the patient to follow up with him tomorrow.  The patient was advised to call immediately if he has any concerning symptoms in the interval. The patient voices understanding of current disease status and treatment options and is in agreement with the current care plan. All questions were answered. The patient knows to call the clinic with any problems, questions or concerns. We can certainly see the patient much sooner if necessary.  Orders Placed This Encounter  Procedures  . TSH    Standing Status:   Standing    Number of Occurrences:   10    Standing Expiration Date:   03/28/2020      Tobe Sos Courtney Fenlon, PA-C 03/29/19  ADDENDUM: Hematology/Oncology Attending: I had a face-to-face encounter with the patient today.  I recommended his care plan.  This is a very pleasant 78 years old white male with extensive stage small cell lung cancer status post systemic chemotherapy with carboplatin, etoposide and Tecentriq for 4 cycles.  The patient is currently on maintenance treatment with immunotherapy with Tecentriq status post 7 cycles. The patient has been tolerating his treatment well with no concerning complaints except for significant hyperglycemia today was blood glucose over 500.  The patient mentioned that he has been eating a lot of candy recently.  He has a history of diabetes mellitus and was treated with metformin in the past but has been off treatment for several years.  His recent hyperglycemia could be also related to his current treatment with immunotherapy with nivolumab. I recommended for the patient to proceed with his treatment with nivolumab today. For the hyperglycemia, we will give the patient regular insulin 15 units today and will recheck his blood sugar closely.  The patient was also advised to check his blood sugar closely at home and to report to his primary care physician for recommendations regarding his condition.  He was advised to go to the emergency department immediately if he has persistent blood sugar over 400. We will see him back for follow-up visit in 3 weeks for evaluation after repeating CT scan of the chest, abdomen and pelvis for restaging of his disease. The patient was advised to call immediately if he has any concerning symptoms in the interval. Disclaimer: This note was  dictated with voice recognition software. Similar sounding words can inadvertently be transcribed and may be missed upon review. Eilleen Kempf, MD 03/29/19

## 2019-03-29 NOTE — Progress Notes (Signed)
Pt got up and left . He did not want to wait for f/u with glucose.

## 2019-03-29 NOTE — Progress Notes (Signed)
OK to treat with today's labs

## 2019-03-29 NOTE — Progress Notes (Signed)
CBG rechecked at 1440 results were 406. Sharlynn Oliphant, RN notified.

## 2019-03-29 NOTE — Progress Notes (Signed)
CBG at 13:40 was 420, 15 units of Regular given at 13:44 (verified with Kaleen Mask)

## 2019-03-30 ENCOUNTER — Telehealth: Payer: Self-pay | Admitting: Physician Assistant

## 2019-03-30 DIAGNOSIS — Z6823 Body mass index (BMI) 23.0-23.9, adult: Secondary | ICD-10-CM | POA: Diagnosis not present

## 2019-03-30 DIAGNOSIS — E11649 Type 2 diabetes mellitus with hypoglycemia without coma: Secondary | ICD-10-CM | POA: Diagnosis not present

## 2019-03-30 NOTE — Telephone Encounter (Signed)
Added additional cycles per 5/27 los - pt to get an updated schedule next visit

## 2019-03-30 NOTE — Telephone Encounter (Signed)
I spoke to the patient's wife to follow up regarding his blood sugar from routine labs yesterday. The patient went to his PCP today and was restarted on metformin. He has a history of type 2 diabetes that was previously well controlled; however, the immunotherapy for his cancer treatment can cause type I diabetes. Therefore, I gave the patient's wife instructions to closely monitor his blood sugars at home. If he develops new or worsening symptoms such as altered mental status, nausea, vomiting, abdominal pain, hypotension, persistently elevated blood sugar readings, etc that he may need to seek emergency evaluation. She was given the number to our office should she have any further questions. She expressed understanding.

## 2019-04-14 ENCOUNTER — Ambulatory Visit (HOSPITAL_COMMUNITY)
Admission: RE | Admit: 2019-04-14 | Discharge: 2019-04-14 | Disposition: A | Discharge status: Home / Self Care | Payer: Medicare HMO | Source: Ambulatory Visit | Attending: Internal Medicine | Admitting: Internal Medicine

## 2019-04-14 ENCOUNTER — Encounter (HOSPITAL_COMMUNITY): Payer: Self-pay

## 2019-04-14 ENCOUNTER — Other Ambulatory Visit: Payer: Self-pay

## 2019-04-14 DIAGNOSIS — C3431 Malignant neoplasm of lower lobe, right bronchus or lung: Secondary | ICD-10-CM | POA: Diagnosis not present

## 2019-04-14 DIAGNOSIS — C3490 Malignant neoplasm of unspecified part of unspecified bronchus or lung: Secondary | ICD-10-CM | POA: Diagnosis not present

## 2019-04-17 ENCOUNTER — Inpatient Hospital Stay: Payer: Medicare HMO

## 2019-04-17 ENCOUNTER — Telehealth: Payer: Self-pay | Admitting: Medical Oncology

## 2019-04-17 ENCOUNTER — Other Ambulatory Visit: Payer: Medicare HMO

## 2019-04-17 ENCOUNTER — Telehealth: Payer: Self-pay | Admitting: *Deleted

## 2019-04-17 ENCOUNTER — Inpatient Hospital Stay (HOSPITAL_BASED_OUTPATIENT_CLINIC_OR_DEPARTMENT_OTHER): Payer: Medicare HMO | Admitting: Internal Medicine

## 2019-04-17 ENCOUNTER — Other Ambulatory Visit: Payer: Self-pay

## 2019-04-17 ENCOUNTER — Inpatient Hospital Stay: Payer: Medicare HMO | Attending: Internal Medicine

## 2019-04-17 ENCOUNTER — Other Ambulatory Visit: Payer: Self-pay | Admitting: Medical Oncology

## 2019-04-17 ENCOUNTER — Encounter: Payer: Self-pay | Admitting: Internal Medicine

## 2019-04-17 VITALS — BP 105/49 | HR 69 | Temp 98.7°F | Resp 18 | Ht 71.0 in | Wt 170.1 lb

## 2019-04-17 DIAGNOSIS — E1165 Type 2 diabetes mellitus with hyperglycemia: Secondary | ICD-10-CM | POA: Insufficient documentation

## 2019-04-17 DIAGNOSIS — E785 Hyperlipidemia, unspecified: Secondary | ICD-10-CM | POA: Diagnosis not present

## 2019-04-17 DIAGNOSIS — J439 Emphysema, unspecified: Secondary | ICD-10-CM

## 2019-04-17 DIAGNOSIS — D649 Anemia, unspecified: Secondary | ICD-10-CM | POA: Diagnosis not present

## 2019-04-17 DIAGNOSIS — R531 Weakness: Secondary | ICD-10-CM | POA: Insufficient documentation

## 2019-04-17 DIAGNOSIS — I110 Hypertensive heart disease with heart failure: Secondary | ICD-10-CM | POA: Insufficient documentation

## 2019-04-17 DIAGNOSIS — I2510 Atherosclerotic heart disease of native coronary artery without angina pectoris: Secondary | ICD-10-CM

## 2019-04-17 DIAGNOSIS — I714 Abdominal aortic aneurysm, without rupture: Secondary | ICD-10-CM

## 2019-04-17 DIAGNOSIS — C7989 Secondary malignant neoplasm of other specified sites: Secondary | ICD-10-CM

## 2019-04-17 DIAGNOSIS — R5383 Other fatigue: Secondary | ICD-10-CM | POA: Insufficient documentation

## 2019-04-17 DIAGNOSIS — Z20822 Contact with and (suspected) exposure to covid-19: Secondary | ICD-10-CM

## 2019-04-17 DIAGNOSIS — C3490 Malignant neoplasm of unspecified part of unspecified bronchus or lung: Secondary | ICD-10-CM

## 2019-04-17 DIAGNOSIS — I252 Old myocardial infarction: Secondary | ICD-10-CM | POA: Insufficient documentation

## 2019-04-17 DIAGNOSIS — N289 Disorder of kidney and ureter, unspecified: Secondary | ICD-10-CM

## 2019-04-17 DIAGNOSIS — R6889 Other general symptoms and signs: Secondary | ICD-10-CM | POA: Diagnosis not present

## 2019-04-17 DIAGNOSIS — I480 Paroxysmal atrial fibrillation: Secondary | ICD-10-CM | POA: Insufficient documentation

## 2019-04-17 DIAGNOSIS — Z79899 Other long term (current) drug therapy: Secondary | ICD-10-CM | POA: Insufficient documentation

## 2019-04-17 DIAGNOSIS — C3411 Malignant neoplasm of upper lobe, right bronchus or lung: Secondary | ICD-10-CM | POA: Diagnosis not present

## 2019-04-17 DIAGNOSIS — I255 Ischemic cardiomyopathy: Secondary | ICD-10-CM

## 2019-04-17 DIAGNOSIS — R630 Anorexia: Secondary | ICD-10-CM | POA: Diagnosis not present

## 2019-04-17 DIAGNOSIS — R634 Abnormal weight loss: Secondary | ICD-10-CM | POA: Insufficient documentation

## 2019-04-17 DIAGNOSIS — Z7982 Long term (current) use of aspirin: Secondary | ICD-10-CM | POA: Insufficient documentation

## 2019-04-17 DIAGNOSIS — Z7901 Long term (current) use of anticoagulants: Secondary | ICD-10-CM

## 2019-04-17 DIAGNOSIS — E1159 Type 2 diabetes mellitus with other circulatory complications: Secondary | ICD-10-CM

## 2019-04-17 DIAGNOSIS — Z5112 Encounter for antineoplastic immunotherapy: Secondary | ICD-10-CM

## 2019-04-17 DIAGNOSIS — I10 Essential (primary) hypertension: Secondary | ICD-10-CM

## 2019-04-17 DIAGNOSIS — J449 Chronic obstructive pulmonary disease, unspecified: Secondary | ICD-10-CM

## 2019-04-17 LAB — CBC WITH DIFFERENTIAL (CANCER CENTER ONLY)
Abs Immature Granulocytes: 0.04 10*3/uL (ref 0.00–0.07)
Basophils Absolute: 0 10*3/uL (ref 0.0–0.1)
Basophils Relative: 0 %
Eosinophils Absolute: 0.7 10*3/uL — ABNORMAL HIGH (ref 0.0–0.5)
Eosinophils Relative: 6 %
HCT: 33.9 % — ABNORMAL LOW (ref 39.0–52.0)
Hemoglobin: 10.9 g/dL — ABNORMAL LOW (ref 13.0–17.0)
Immature Granulocytes: 0 %
Lymphocytes Relative: 13 %
Lymphs Abs: 1.5 10*3/uL (ref 0.7–4.0)
MCH: 27.9 pg (ref 26.0–34.0)
MCHC: 32.2 g/dL (ref 30.0–36.0)
MCV: 86.9 fL (ref 80.0–100.0)
Monocytes Absolute: 1 10*3/uL (ref 0.1–1.0)
Monocytes Relative: 9 %
Neutro Abs: 7.9 10*3/uL — ABNORMAL HIGH (ref 1.7–7.7)
Neutrophils Relative %: 72 %
Platelet Count: 281 10*3/uL (ref 150–400)
RBC: 3.9 MIL/uL — ABNORMAL LOW (ref 4.22–5.81)
RDW: 13.3 % (ref 11.5–15.5)
WBC Count: 11.1 10*3/uL — ABNORMAL HIGH (ref 4.0–10.5)
nRBC: 0 % (ref 0.0–0.2)

## 2019-04-17 LAB — CMP (CANCER CENTER ONLY)
ALT: 60 U/L — ABNORMAL HIGH (ref 0–44)
AST: 36 U/L (ref 15–41)
Albumin: 3 g/dL — ABNORMAL LOW (ref 3.5–5.0)
Alkaline Phosphatase: 96 U/L (ref 38–126)
Anion gap: 11 (ref 5–15)
BUN: 29 mg/dL — ABNORMAL HIGH (ref 8–23)
CO2: 22 mmol/L (ref 22–32)
Calcium: 9 mg/dL (ref 8.9–10.3)
Chloride: 96 mmol/L — ABNORMAL LOW (ref 98–111)
Creatinine: 1.69 mg/dL — ABNORMAL HIGH (ref 0.61–1.24)
GFR, Est AFR Am: 44 mL/min — ABNORMAL LOW (ref >60)
GFR, Estimated: 38 mL/min — ABNORMAL LOW (ref >60)
Glucose, Bld: 406 mg/dL — ABNORMAL HIGH (ref 70–99)
Potassium: 5.3 mmol/L — ABNORMAL HIGH (ref 3.5–5.1)
Sodium: 129 mmol/L — ABNORMAL LOW (ref 135–145)
Total Bilirubin: 0.4 mg/dL (ref 0.3–1.2)
Total Protein: 6.9 g/dL (ref 6.5–8.1)

## 2019-04-17 LAB — TSH: TSH: 0.451 u[IU]/mL (ref 0.320–4.118)

## 2019-04-17 MED ORDER — PREDNISONE 20 MG PO TABS: ORAL_TABLET | ORAL | 0 refills | Status: DC

## 2019-04-17 MED ORDER — DOXYCYCLINE HYCLATE 100 MG PO TABS: 100.0000 mg | ORAL_TABLET | Freq: Two times a day (BID) | ORAL | 0 refills | Status: DC

## 2019-04-17 NOTE — Progress Notes (Signed)
[facility] [phone]   Fax:([phone]) [place], [place] [address]  DIAGNOSIS: Extensive stage (T1 a, N2, M1a) extensive stage small cell lung cancer presented with right lung nodules in addition to mediastinal lymphadenopathy as well as pericardial metastasis diagnosed in October 2019  PRIOR THERAPY: None.  CURRENT THERAPY: palliative systemic chemotherapy with carboplatin for AUC of 5 on day 1, etoposide 100 mg/M2 on days 1, 2 and 3 in addition to Tecentriq (Atezolizumab) 1200 mg IV every 3 weeks.  Status post 11 cycles.  Starting from cycle #5 the patient will be treated with maintenance immunotherapy with Tecentriq 1200 mg IV every 3 weeks.  INTERVAL HISTORY: Gregory Bates 78 y.o. male returns to the clinic today for follow-up visit.  The patient is complaining of increasing fatigue and weakness as well as lack of appetite and he lost around 6 pounds since his last visit.  He has not been for energy.  He continues to have shortness of breath and currently on home oxygen.  He denied having any recent cough or hemoptysis.  He denied having any chest pain.  He has no fever or chills.  He denied having any exposure in addition with COVID19.  He has been tolerating his treatment with Tecentriq fairly well.  The patient was seen recently by his primary care physician and started on treatment with metformin and insulin because of the hyperglycemia.  He had repeat CT scan of the chest, abdomen pelvis performed recently and is here for evaluation and discussion of his scan results and treatment options.  MEDICAL HISTORY: Past Medical History:  Diagnosis Date   Acute renal insufficiency    a. During 11/2013 adm with atrial flutter.   Anemia, unspecified    Atrial fibrillation (HCC)    CAD (coronary artery disease), native coronary artery    a. s/p cath May 2011 >> DES distal RCA;  b. 01/2013 NSTEMI  >> OM1 99p (2.75x12 Promus Premier DES);  c. LHC (2/15): PCI: Promus DES to dist RCA ;  d. LHC 1/16 - dLM 68, oLAD 30, small D1 tandem 42, pOM1 stent ok, mOM 77-60, dOM bifurcation 50-60 in both vessels, AV 58-70 jailed by stent, pRCA 2 mRCA stent ok, dRCA stent ok, PLB smal 50, dPDA 60, 80; EF 35-40 >> med Rx - PCI of PDA if angina   Chronic combined systolic and diastolic CHF (congestive heart failure) ([facility]) 12/23/2015   Echo 2/17: Mild LVH, EF 45-50%, inferior akinesis, grade 2 diastolic dysfunction, mild AI, mild MR, mild LAE, PASP 44 mmHg    Colorectal polyps 2002   Diabetes mellitus    AODM   Erectile dysfunction    History of echocardiogram    a. 01/2013 Echo: EF 55%, mid-dist inflat HK, Gr1 DD, Triv AI/TR, Mild MR.;  b.  Echo (1/15): Mild LVH, EF 55%, inferolateral hypokinesis, grade 1 diastolic dysfunction, mild AI, trivial MR, moderate LAE, PASP 36 mmHg    Hx of cardiovascular stress test    Stress myoview 08/07/13 with LVEF 44%, inferior scar, no ischemia   Hyperlipidemia    Hypertension    Hyponatremia    Ischemic cardiomyopathy    a. EF 35-40% by LHC in 1/16; b. Echo 2/17: Mild LVH, EF 45-50%, inferior akinesis, grade 2 diastolic dysfunction, mild AI, mild MR, mild LAE, PASP 44 mmHg   Myocardial infarction (HCC)    Oxygen deficiency  PAF (paroxysmal atrial fibrillation) ([place])    a. Dx 11/2013. Spont conv. Placed on eliquis.   Small cell lung cancer in adult [facility]) 07/28/2018    ALLERGIES:  is allergic to brilinta [ticagrelor] and crestor [rosuvastatin].  MEDICATIONS:  Current Outpatient Medications  Medication Sig Dispense Refill   acetaminophen (TYLENOL) 325 MG tablet Take 2 tablets (650 mg total) by mouth every 6 (six) hours as needed for mild pain. (Patient not taking: Reported on 11/21/2018)     albuterol (PROVENTIL HFA;VENTOLIN HFA) 108 (90 Base) MCG/ACT inhaler Inhale 2 puffs into the lungs every 6 (six) hours as needed for wheezing or shortness of  breath. (Patient not taking: Reported on 11/21/2018) 1 Inhaler 2   amiodarone (PACERONE) 200 MG tablet TAKE 1 TABLET (200 MG TOTAL) BY MOUTH DAILY. 90 tablet 1   apixaban (ELIQUIS) 5 MG TABS tablet Take 1 tablet (5 mg total) by mouth 2 (two) times daily. 180 tablet 2   aspirin EC 81 MG EC tablet Take 1 tablet (81 mg total) by mouth daily.     atorvastatin (LIPITOR) 20 MG tablet TAKE 1 TABLET EVERY DAY 90 tablet 3   cholecalciferol (VITAMIN D) 1000 UNITS tablet Take 1,000 Units by mouth every evening.      clopidogrel (PLAVIX) 75 MG tablet TAKE 1 TABLET EVERY DAY WITH BREAKFAST 90 tablet 1   fexofenadine (ALLEGRA) 180 MG tablet Take 180 mg by mouth daily as needed for allergies.      fluticasone (FLONASE) 50 MCG/ACT nasal spray Place 1 spray into both nostrils daily as needed for allergies.      furosemide (LASIX) 20 MG tablet TAKE 1 TABLET (20 MG TOTAL) BY MOUTH DAILY. 90 tablet 1   furosemide (LASIX) 40 MG tablet Take 1 tablet (40 mg total) by mouth 2 (two) times daily. 60 tablet 3   guaiFENesin (MUCINEX) 600 MG 12 hr tablet Take 1 tablet (600 mg total) by mouth 2 (two) times daily as needed for cough or to loosen phlegm.     Iron Combinations (IRON COMPLEX PO) Take 1 tablet by mouth daily.     isosorbide mononitrate (IMDUR) 60 MG 24 hr tablet TAKE 1 TABLET EVERY DAY 90 tablet 2   metoprolol tartrate (LOPRESSOR) 25 MG tablet TAKE 1 TABLET TWICE DAILY 180 tablet 2   Multiple Vitamin (MULTIVITAMIN) tablet Take 1 tablet by mouth at bedtime.      ondansetron (ZOFRAN ODT) 4 MG disintegrating tablet Take 1 tablet (4 mg total) by mouth every 8 (eight) hours as needed for nausea or vomiting. (Patient not taking: Reported on 11/21/2018) 20 tablet 0   prochlorperazine (COMPAZINE) 10 MG tablet Take 1 tablet (10 mg total) by mouth every 6 (six) hours as needed for nausea or vomiting. (Patient not taking: Reported on 11/21/2018) 30 tablet 0   vitamin E 100 UNIT capsule Take 100 Units by mouth at  bedtime.      No current facility-administered medications for this visit.     SURGICAL HISTORY:  Past Surgical History:  Procedure Laterality Date   BIOPSY  08/01/2018   Procedure: BIOPSY OF PERICARDIAL LYMPH NODE AND HILAR LYMPH NODE;  Surgeon: Grace Isaac, MD;  Location: [facility];  Service: Open Heart Surgery;;   CLIPPING OF ATRIAL APPENDAGE Left 08/01/2018   Procedure: CLIPPING OF ATRIAL APPENDAGE;  Surgeon: Grace Isaac, MD;  Location: [facility];  Service: Open Heart Surgery;  Laterality: Left;   COLONOSCOPY W/ POLYPECTOMY  2002   [facility]  COLONOSCOPY W/ POLYPECTOMY  2004; 2007 negative   CORONARY ANGIOPLASTY  12/26/2013   CORONARY ARTERY BYPASS GRAFT N/A 08/01/2018   Procedure: CORONARY ARTERY BYPASS GRAFTING (CABG) TIMES THREE USING LEFT INTERNAL MAMMARY ARTERY TO LAD, RIGHT GREATER SAPHENOUS VEIN GRAFT TO OM AND RCA, VEIN HARVESTED ENDOSCOPICALLY;  Surgeon: Grace Isaac, MD;  Location: [facility];  Service: Open Heart Surgery;  Laterality: N/A;   CORONARY STENT PLACEMENT  2007, 2011   infantile paralysis facial asymmetry     LEFT HEART CATH AND CORONARY ANGIOGRAPHY N/A 05/18/2017   Procedure: Left Heart Cath and Coronary Angiography;  Surgeon: Martinique, Peter M, MD;  Location: [facility] CV LAB;  Service: Cardiovascular;  Laterality: N/A;   LEFT HEART CATH AND CORONARY ANGIOGRAPHY N/A 07/27/2018   Procedure: LEFT HEART CATH AND CORONARY ANGIOGRAPHY;  Surgeon: Martinique, Peter M, MD;  Location: [facility];  Service: Cardiovascular;  Laterality: N/A;   LEFT HEART CATHETERIZATION WITH CORONARY ANGIOGRAM N/A 10/02/2012   Procedure: LEFT HEART CATHETERIZATION WITH CORONARY ANGIOGRAM;  Surgeon: Peter M Martinique, MD;  Location: [facility] CATH LAB;  Service: Cardiovascular;  Laterality: N/A;   LEFT HEART CATHETERIZATION WITH CORONARY ANGIOGRAM N/A 02/20/2013   Procedure: LEFT HEART CATHETERIZATION WITH CORONARY ANGIOGRAM;  Surgeon: Burnell Blanks, MD;  Location: [facility] CATH  LAB;  Service: Cardiovascular;  Laterality: N/A;   LEFT HEART CATHETERIZATION WITH CORONARY ANGIOGRAM N/A 12/26/2013   Procedure: LEFT HEART CATHETERIZATION WITH CORONARY ANGIOGRAM;  Surgeon: Burnell Blanks, MD;  Location: [facility] CATH LAB;  Service: Cardiovascular;  Laterality: N/A;   LEFT HEART CATHETERIZATION WITH CORONARY ANGIOGRAM N/A 11/28/2014   Procedure: LEFT HEART CATHETERIZATION WITH CORONARY ANGIOGRAM;  Surgeon: Burnell Blanks, MD;  Location: [facility] CATH LAB;  Service: Cardiovascular;  Laterality: N/A;   PERCUTANEOUS CORONARY STENT INTERVENTION (PCI-S)  10/02/2012   Procedure: PERCUTANEOUS CORONARY STENT INTERVENTION (PCI-S);  Surgeon: Peter M Martinique, MD;  Location: [facility] CATH LAB;  Service: Cardiovascular;;   PERCUTANEOUS CORONARY STENT INTERVENTION (PCI-S)  02/20/2013   Procedure: PERCUTANEOUS CORONARY STENT INTERVENTION (PCI-S);  Surgeon: Burnell Blanks, MD;  Location: [facility] CATH LAB;  Service: Cardiovascular;;   PERCUTANEOUS CORONARY STENT INTERVENTION (PCI-S)  12/26/2013   Procedure: PERCUTANEOUS CORONARY STENT INTERVENTION (PCI-S);  Surgeon: Burnell Blanks, MD;  Location: [facility] CATH LAB;  Service: Cardiovascular;;   TEE WITHOUT CARDIOVERSION N/A 08/01/2018   Procedure: TRANSESOPHAGEAL ECHOCARDIOGRAM (TEE);  Surgeon: Grace Isaac, MD;  Location: [place];  Service: Open Heart Surgery;  Laterality: N/A;   WISDOM TOOTH EXTRACTION      REVIEW OF SYSTEMS:  Constitutional: positive for anorexia, fatigue and weight loss Eyes: negative Ears, nose, mouth, throat, and face: negative Respiratory: positive for dyspnea on exertion Cardiovascular: negative Gastrointestinal: negative Genitourinary:negative Integument/breast: negative Hematologic/lymphatic: negative Musculoskeletal:negative Neurological: negative Behavioral/Psych: negative Endocrine: negative Allergic/Immunologic: negative   PHYSICAL EXAMINATION: General appearance: alert, cooperative, fatigued and  no distress Head: Normocephalic, without obvious abnormality, atraumatic Neck: no adenopathy, no JVD, supple, symmetrical, trachea midline and thyroid not enlarged, symmetric, no tenderness/mass/nodules Lymph nodes: Cervical, supraclavicular, and axillary nodes normal. Resp: rales bilaterally Back: symmetric, no curvature. ROM normal. No CVA tenderness. Cardio: regular rate and rhythm, S1, S2 normal, no murmur, click, rub or gallop GI: soft, non-tender; bowel sounds normal; no masses,  no organomegaly Extremities: extremities normal, atraumatic, no cyanosis or edema Neurologic: Alert and oriented X 3, normal strength and tone. Normal symmetric reflexes. Normal coordination and gait  ECOG PERFORMANCE STATUS: 1 - Symptomatic but completely ambulatory  Blood pressure [name] Kitchen)  105/49, pulse 69, temperature 98.7 F (37.1 C), temperature source Oral, resp. rate 18, height 5\' 11"  (1.803 m), weight 170 lb 1.6 oz (77.2 kg), SpO2 97 %.  LABORATORY DATA: Lab Results  Component Value Date   WBC 11.1 (H) 04/17/2019   HGB 10.9 (L) 04/17/2019   HCT 33.9 (L) 04/17/2019   MCV 86.9 04/17/2019   PLT 281 04/17/2019      Chemistry      Component Value Date/Time   NA 130 (L) 03/29/2019 1118   NA 126 (L) 08/24/2018 1237   K 4.6 03/29/2019 1118   CL 97 (L) 03/29/2019 1118   CO2 24 03/29/2019 1118   BUN 28 (H) 03/29/2019 1118   BUN 19 08/24/2018 1237   CREATININE 1.88 (H) 03/29/2019 1118   CREATININE 1.67 (H) 10/30/2016 1033      Component Value Date/Time   CALCIUM 9.1 03/29/2019 1118   ALKPHOS 115 03/29/2019 1118   AST 12 (L) 03/29/2019 1118   ALT 19 03/29/2019 1118   BILITOT 0.5 03/29/2019 1118       RADIOGRAPHIC STUDIES: Ct Abdomen Pelvis Wo Contrast  Result Date: 04/14/2019 CLINICAL DATA:  Extensive stage right small cell lung cancer with ongoing chemotherapy. Restaging. EXAM: CT CHEST, ABDOMEN AND PELVIS WITHOUT CONTRAST TECHNIQUE: Multidetector CT imaging of the chest, abdomen and pelvis  was performed following the standard protocol without IV contrast. COMPARISON:  12/30/2018 CT chest, abdomen and pelvis. FINDINGS: CT CHEST FINDINGS Cardiovascular: Normal heart size. No significant pericardial effusion/thickening. Left main and 3 vessel coronary atherosclerosis status post CABG. Atherosclerotic nonaneurysmal thoracic aorta. Normal caliber pulmonary arteries. Mediastinum/Nodes: No discrete thyroid nodules. Unremarkable esophagus. No axillary adenopathy. High left mediastinal 1.7 x 1.4 cm soft tissue nodule between the brachiocephalic and left common carotid arteries (series 2/image 12), unchanged since 12/09/2015 chest CT and non hypermetabolic on 08/18/5101 PET-CT, favor an exophytic left thyroid nodule. No recurrent mediastinal or discrete hilar adenopathy on this noncontrast scan. Lungs/Pleura: No pneumothorax. No pleural effusion. Severe centrilobular and paraseptal emphysema with mild diffuse bronchial wall thickening. There is new extensive patchy consolidation throughout both lungs with associated scattered air bronchograms, most prominent in the bilateral lower lobes, with lesser involvement of anterior right upper lobe and medial lingula. No discrete lung masses or significant pulmonary nodules. Musculoskeletal: No aggressive appearing focal osseous lesions. Intact sternotomy wires. Nonunion of sternotomy. Thoracic spondylosis. CT ABDOMEN PELVIS FINDINGS Hepatobiliary: Normal liver size. Stable mildly hyperdense liver parenchyma. No liver masses. Normal gallbladder with no radiopaque cholelithiasis. No biliary ductal dilatation. Pancreas: Normal, with no mass or duct dilation. Spleen: Normal size. No mass. Adrenals/Urinary Tract: Normal adrenals. No right renal stones. Punctate nonobstructing stone versus vascular calcification in the upper left renal sinus. No hydronephrosis. Simple 3.2 cm interpolar right renal cyst. No additional contour deforming renal masses. No recurrent right  perinephric mass. Normal bladder. Stomach/Bowel: Small hiatal hernia. Otherwise normal nondistended stomach. Normal caliber small bowel with no small bowel wall thickening. Normal appendix. Moderate sigmoid diverticulosis, with no large bowel wall thickening or significant pericolonic fat stranding. Oral contrast transits to the rectum. Vascular/Lymphatic: Atherosclerotic abdominal aorta with stable 4.0 cm infrarenal abdominal aortic aneurysm using similar measurement technique. No recurrent portacaval adenopathy. No pathologically enlarged lymph nodes in the abdomen or pelvis. Reproductive: Top-normal size prostate. Other: No pneumoperitoneum, ascites or focal fluid collection. Musculoskeletal: No aggressive appearing focal osseous lesions. Moderate lumbar spondylosis. IMPRESSION: 1. New extensive patchy consolidation in both lungs with scattered air bronchograms, most prominent in the lower lobes,  most compatible with multilobar pneumonia (which could be due to infection or drug toxicity). Recurrent pulmonary tumor is considered less likely. Short-term follow-up post treatment chest CT advised in 3 months. 2. No findings highly suspicious for recurrent metastatic disease in the chest, abdomen or pelvis. 3. Stable infrarenal 4.0 cm Abdominal Aortic Aneurysm (ICD10-I71.9). Recommend follow-up aortic ultrasound in 1 year. This recommendation follows ACR consensus guidelines: White Paper of the ACR Incidental Findings Committee II on Vascular Findings. J Am Coll Radiol 2013; 97:989-211. 4. Aortic Atherosclerosis (ICD10-I70.0) and Emphysema (ICD10-J43.9). Additional chronic findings as detailed. These results will be called to the ordering clinician or representative by the Radiologist Assistant, and communication documented in the PACS or zVision Dashboard. Electronically Signed   By: Ilona Sorrel M.D.   On: 04/14/2019 10:03   Ct Chest Wo Contrast  Result Date: 04/14/2019 CLINICAL DATA:  Extensive stage right  small cell lung cancer with ongoing chemotherapy. Restaging. EXAM: CT CHEST, ABDOMEN AND PELVIS WITHOUT CONTRAST TECHNIQUE: Multidetector CT imaging of the chest, abdomen and pelvis was performed following the standard protocol without IV contrast. COMPARISON:  12/30/2018 CT chest, abdomen and pelvis. FINDINGS: CT CHEST FINDINGS Cardiovascular: Normal heart size. No significant pericardial effusion/thickening. Left main and 3 vessel coronary atherosclerosis status post CABG. Atherosclerotic nonaneurysmal thoracic aorta. Normal caliber pulmonary arteries. Mediastinum/Nodes: No discrete thyroid nodules. Unremarkable esophagus. No axillary adenopathy. High left mediastinal 1.7 x 1.4 cm soft tissue nodule between the brachiocephalic and left common carotid arteries (series 2/image 12), unchanged since 12/09/2015 chest CT and non hypermetabolic on 94/17/4081 PET-CT, favor an exophytic left thyroid nodule. No recurrent mediastinal or discrete hilar adenopathy on this noncontrast scan. Lungs/Pleura: No pneumothorax. No pleural effusion. Severe centrilobular and paraseptal emphysema with mild diffuse bronchial wall thickening. There is new extensive patchy consolidation throughout both lungs with associated scattered air bronchograms, most prominent in the bilateral lower lobes, with lesser involvement of anterior right upper lobe and medial lingula. No discrete lung masses or significant pulmonary nodules. Musculoskeletal: No aggressive appearing focal osseous lesions. Intact sternotomy wires. Nonunion of sternotomy. Thoracic spondylosis. CT ABDOMEN PELVIS FINDINGS Hepatobiliary: Normal liver size. Stable mildly hyperdense liver parenchyma. No liver masses. Normal gallbladder with no radiopaque cholelithiasis. No biliary ductal dilatation. Pancreas: Normal, with no mass or duct dilation. Spleen: Normal size. No mass. Adrenals/Urinary Tract: Normal adrenals. No right renal stones. Punctate nonobstructing stone versus  vascular calcification in the upper left renal sinus. No hydronephrosis. Simple 3.2 cm interpolar right renal cyst. No additional contour deforming renal masses. No recurrent right perinephric mass. Normal bladder. Stomach/Bowel: Small hiatal hernia. Otherwise normal nondistended stomach. Normal caliber small bowel with no small bowel wall thickening. Normal appendix. Moderate sigmoid diverticulosis, with no large bowel wall thickening or significant pericolonic fat stranding. Oral contrast transits to the rectum. Vascular/Lymphatic: Atherosclerotic abdominal aorta with stable 4.0 cm infrarenal abdominal aortic aneurysm using similar measurement technique. No recurrent portacaval adenopathy. No pathologically enlarged lymph nodes in the abdomen or pelvis. Reproductive: Top-normal size prostate. Other: No pneumoperitoneum, ascites or focal fluid collection. Musculoskeletal: No aggressive appearing focal osseous lesions. Moderate lumbar spondylosis. IMPRESSION: 1. New extensive patchy consolidation in both lungs with scattered air bronchograms, most prominent in the lower lobes, most compatible with multilobar pneumonia (which could be due to infection or drug toxicity). Recurrent pulmonary tumor is considered less likely. Short-term follow-up post treatment chest CT advised in 3 months. 2. No findings highly suspicious for recurrent metastatic disease in the chest, abdomen or pelvis. 3. Stable infrarenal 4.0  cm Abdominal Aortic Aneurysm (ICD10-I71.9). Recommend follow-up aortic ultrasound in 1 year. This recommendation follows ACR consensus guidelines: White Paper of the ACR Incidental Findings Committee II on Vascular Findings. J Am Coll Radiol 2013; 51:884-166. 4. Aortic Atherosclerosis (ICD10-I70.0) and Emphysema (ICD10-J43.9). Additional chronic findings as detailed. These results will be called to the ordering clinician or representative by the Radiologist Assistant, and communication documented in the PACS or  zVision Dashboard. Electronically Signed   By: Ilona Sorrel M.D.   On: 04/14/2019 10:03    ASSESSMENT AND PLAN: This is a very pleasant 78 years old white male recently diagnosed with extensive stage small cell lung cancer.  The patient is here today for evaluation before starting the first cycle of his systemic chemotherapy with carboplatin, etoposide and Tecentriq.  Status post 11 cycles.  Starting from cycle #7 the patient has been on treatment with single agent Tecentriq. The patient has been tolerating this treatment well with no concerning adverse effects but has more fatigue and weakness recently.  He also had suspicious immunotherapy induced hyperglycemia and was a started recently on treatment with metformin and insulin The patient had repeat CT scan of the chest, abdomen pelvis performed recently.  I personally have independently reviewed the scan images and discussed the results with the patient and showed him the images.  His a scan showed new extensive patchy consolidation in the lungs bilaterally especially at the lower lobes highly suspicious for immunotherapy mediated pneumonitis but multifocal pneumonia could not be excluded.  This pattern could be also seen with viral mediated pneumonitis. I recommended for the patient to hold his treatment with Tecentriq for now. I will start the patient on a tapering dose of prednisone starting at 80 mg p.o. daily for 1 week followed by 60 mg p.o. daily for 1 week followed by 40 mg p.o. daily for 1 week followed by 20 mg oral daily for 1 week followed by 10 mg p.o. daily for 1 week. I will see him back for follow-up visit in 6 weeks with repeat CT scan of the chest for reevaluation of his lung before resuming his treatment with immunotherapy. He will need more adjustment of his blood sugar especially with the starting prednisone treatment and he is scheduled to see his primary care physician in few days. I would also test the patient for COVID19 because  of the suspicious airspace disease pattern on the CT scan of the chest. I will also start the patient empirically on doxycycline 100 mg p.o. twice daily for any underlying infectious process in the lung especially atypical pneumonia. He was advised to call immediately if he has any concerning symptoms in the interval. The patient voices understanding of current disease status and treatment options and is in agreement with the current care plan. All questions were answered. The patient knows to call the clinic with any problems, questions or concerns. We can certainly see the patient much sooner if necessary.  Disclaimer: This note was dictated with voice recognition software. Similar sounding words can inadvertently be transcribed and may not be corrected upon review.

## 2019-04-17 NOTE — Telephone Encounter (Signed)
Dr Julien [facility]- ordering test:  Suspicious CT scan- infiltrates

## 2019-04-17 NOTE — Progress Notes (Signed)
COVID testing scheduled for today at 345 pm [facility]. Pt aware.

## 2019-04-17 NOTE — Addendum Note (Signed)
Addended by: Ardeen Garland on: 04/17/2019 03:50 PM   Modules accepted: Orders

## 2019-04-17 NOTE — Telephone Encounter (Signed)
Called in prescription for prednisone and LVM that pt will pick up today.

## 2019-04-18 LAB — NOVEL CORONAVIRUS, NAA: SARS-CoV-2, NAA: NOT DETECTED

## 2019-04-19 ENCOUNTER — Other Ambulatory Visit: Payer: Self-pay | Admitting: Cardiovascular Disease

## 2019-04-20 ENCOUNTER — Telehealth: Payer: Self-pay | Admitting: *Deleted

## 2019-04-20 ENCOUNTER — Other Ambulatory Visit: Payer: Self-pay | Admitting: *Deleted

## 2019-04-20 ENCOUNTER — Telehealth: Payer: Self-pay | Admitting: Internal Medicine

## 2019-04-20 DIAGNOSIS — J9611 Chronic respiratory failure with hypoxia: Secondary | ICD-10-CM | POA: Diagnosis not present

## 2019-04-20 DIAGNOSIS — Z9861 Coronary angioplasty status: Secondary | ICD-10-CM | POA: Diagnosis not present

## 2019-04-20 DIAGNOSIS — C3490 Malignant neoplasm of unspecified part of unspecified bronchus or lung: Secondary | ICD-10-CM | POA: Diagnosis not present

## 2019-04-20 DIAGNOSIS — E1159 Type 2 diabetes mellitus with other circulatory complications: Secondary | ICD-10-CM | POA: Diagnosis not present

## 2019-04-20 DIAGNOSIS — I110 Hypertensive heart disease with heart failure: Secondary | ICD-10-CM | POA: Diagnosis not present

## 2019-04-20 DIAGNOSIS — C3411 Malignant neoplasm of upper lobe, right bronchus or lung: Secondary | ICD-10-CM | POA: Diagnosis not present

## 2019-04-20 DIAGNOSIS — I2510 Atherosclerotic heart disease of native coronary artery without angina pectoris: Secondary | ICD-10-CM | POA: Diagnosis not present

## 2019-04-20 DIAGNOSIS — Z9981 Dependence on supplemental oxygen: Secondary | ICD-10-CM | POA: Diagnosis not present

## 2019-04-20 DIAGNOSIS — I5022 Chronic systolic (congestive) heart failure: Secondary | ICD-10-CM | POA: Diagnosis not present

## 2019-04-20 NOTE — Telephone Encounter (Signed)
Pt reports he was received Eliquis through assistance program in the past.  He is asking if he would qualify again for assistance.

## 2019-04-20 NOTE — Telephone Encounter (Signed)
Lisinopril was stopped per discharge instructions on October 1,2019. I spoke with pt who reports he has continued to take it. Will review with Dr. Angelena Form to see if pt should stop or continue Lisinopril

## 2019-04-20 NOTE — Telephone Encounter (Signed)
Opened in error

## 2019-04-20 NOTE — Telephone Encounter (Signed)
I called the phone number listed in the pts chart as his cell phone number. The pts grandson answered the phone and stated that this is his phone number and that he does help take care of the pt but is currently not with him at this time. He asked me to call the pt at [phone] so I can talk with him concerning pt asst for Eliquis.  I called that phone number but got no answer so I left a message on the VM asking the pt to call me back.

## 2019-04-20 NOTE — Telephone Encounter (Signed)
[place] request for Lisinopril 20 mg. Per office note from 02/21/18 medication was stopped. Medication not listed on 08/24/18 ofc vs. Please advise. Thank you

## 2019-04-20 NOTE — Telephone Encounter (Signed)
Cancelled 7/6 appt per los. Added lab for ct. Called and left msg for patient

## 2019-04-21 NOTE — Telephone Encounter (Signed)
It looks to me like he should be off of the Lisinopril. He should stop it and follow BP at home for several weeks. Thanks, chris

## 2019-04-21 NOTE — Telephone Encounter (Signed)
New Message ° ° °Patient returning your call. °

## 2019-04-21 NOTE — Telephone Encounter (Signed)
I placed call which was answered by pt's grandson.  He asked me to call pt at [phone] or pt's wife at [phone].  I tried number for pt but voicemail not set up. I left message on pt's wife's voicemail to call office. I spoke with [facility] mail order and they have removed Lisinopril from pt's medication profile.

## 2019-04-21 NOTE — Telephone Encounter (Signed)
I called the pt back and got no answer so I left a message asking him to call me back at the office and that if he calls back after 3 pm today I will return his call on Monday 04/24/2019.

## 2019-04-21 NOTE — Telephone Encounter (Signed)
I called the pt again and was able to s/w with his wife as she states that "he is [place] on the interstate". I offered her [place] phone number so they can call to see if the pt is eligible to receive pt asst and to request that [employer] mail them an application. She declined and stated that [place] and she has nothing to write with or on at this time.  She states that she will look on the Internet for the [employer] pt asst [employer] phone number and will call them when they get home.  They are aware to call me if they have any questions or concerns.

## 2019-04-21 NOTE — Telephone Encounter (Signed)
I spoke with pt and gave him information from Dr. Angelena Form.  He stopped lisinopril yesterday.  He will check BP for next couple of weeks and call with an update.

## 2019-04-24 DIAGNOSIS — J9611 Chronic respiratory failure with hypoxia: Secondary | ICD-10-CM | POA: Diagnosis not present

## 2019-05-08 ENCOUNTER — Ambulatory Visit: Payer: Medicare HMO

## 2019-05-08 ENCOUNTER — Ambulatory Visit: Payer: Medicare HMO | Admitting: Internal Medicine

## 2019-05-08 ENCOUNTER — Other Ambulatory Visit: Payer: Medicare HMO

## 2019-05-10 ENCOUNTER — Other Ambulatory Visit: Payer: Self-pay | Admitting: Cardiovascular Disease

## 2019-05-10 NOTE — Telephone Encounter (Signed)
NTG was stopped at time of discharge following CABG. -See Discharge summary on 08/02/18. Is is OK to refill?

## 2019-05-10 NOTE — Telephone Encounter (Signed)
Pt calling requesting a refill on Nitroglycerin. This medication is no longer on pt's medication list. Would Dr. Angelena Form like to prescribe this medication again? Please address

## 2019-05-11 NOTE — Telephone Encounter (Signed)
Hey. I don't see a reason why they would have stopped NTG. Am I missing something there? If not we can refill. Thanks Electrical engineer.   Gerald Stabs

## 2019-05-12 MED ORDER — NITROGLYCERIN 0.4 MG SL SUBL: 0.4000 mg | SUBLINGUAL_TABLET | SUBLINGUAL | 2 refills | Status: DC | PRN

## 2019-05-12 NOTE — Telephone Encounter (Signed)
Pt's medication was sent to pt's pharmacy as requested. Confirmation received.  °

## 2019-05-12 NOTE — Telephone Encounter (Signed)
I cannot find any documentation as to why is was stopped.  Will send refill to pharmacy

## 2019-05-15 DIAGNOSIS — C3490 Malignant neoplasm of unspecified part of unspecified bronchus or lung: Secondary | ICD-10-CM | POA: Diagnosis not present

## 2019-05-15 DIAGNOSIS — Z9981 Dependence on supplemental oxygen: Secondary | ICD-10-CM | POA: Diagnosis not present

## 2019-05-15 DIAGNOSIS — I5022 Chronic systolic (congestive) heart failure: Secondary | ICD-10-CM | POA: Diagnosis not present

## 2019-05-15 DIAGNOSIS — C3411 Malignant neoplasm of upper lobe, right bronchus or lung: Secondary | ICD-10-CM | POA: Diagnosis not present

## 2019-05-15 DIAGNOSIS — J9611 Chronic respiratory failure with hypoxia: Secondary | ICD-10-CM | POA: Diagnosis not present

## 2019-05-15 DIAGNOSIS — E1159 Type 2 diabetes mellitus with other circulatory complications: Secondary | ICD-10-CM | POA: Diagnosis not present

## 2019-05-15 DIAGNOSIS — Z9861 Coronary angioplasty status: Secondary | ICD-10-CM | POA: Diagnosis not present

## 2019-05-15 DIAGNOSIS — I110 Hypertensive heart disease with heart failure: Secondary | ICD-10-CM | POA: Diagnosis not present

## 2019-05-15 DIAGNOSIS — I2510 Atherosclerotic heart disease of native coronary artery without angina pectoris: Secondary | ICD-10-CM | POA: Diagnosis not present

## 2019-05-24 DIAGNOSIS — J9611 Chronic respiratory failure with hypoxia: Secondary | ICD-10-CM | POA: Diagnosis not present

## 2019-05-25 ENCOUNTER — Other Ambulatory Visit: Payer: Self-pay

## 2019-05-25 ENCOUNTER — Ambulatory Visit (HOSPITAL_COMMUNITY)
Admission: RE | Admit: 2019-05-25 | Discharge: 2019-05-25 | Disposition: A | Discharge status: Home / Self Care | Payer: Medicare HMO | Source: Ambulatory Visit | Attending: Internal Medicine | Admitting: Internal Medicine

## 2019-05-25 DIAGNOSIS — C3490 Malignant neoplasm of unspecified part of unspecified bronchus or lung: Secondary | ICD-10-CM | POA: Diagnosis not present

## 2019-05-26 ENCOUNTER — Other Ambulatory Visit: Payer: Self-pay

## 2019-05-26 ENCOUNTER — Inpatient Hospital Stay: Payer: Medicare HMO | Attending: Internal Medicine

## 2019-05-26 DIAGNOSIS — Z8719 Personal history of other diseases of the digestive system: Secondary | ICD-10-CM | POA: Insufficient documentation

## 2019-05-26 DIAGNOSIS — Z7901 Long term (current) use of anticoagulants: Secondary | ICD-10-CM | POA: Diagnosis not present

## 2019-05-26 DIAGNOSIS — I252 Old myocardial infarction: Secondary | ICD-10-CM | POA: Diagnosis not present

## 2019-05-26 DIAGNOSIS — E119 Type 2 diabetes mellitus without complications: Secondary | ICD-10-CM | POA: Diagnosis not present

## 2019-05-26 DIAGNOSIS — I110 Hypertensive heart disease with heart failure: Secondary | ICD-10-CM | POA: Diagnosis not present

## 2019-05-26 DIAGNOSIS — E785 Hyperlipidemia, unspecified: Secondary | ICD-10-CM | POA: Diagnosis not present

## 2019-05-26 DIAGNOSIS — C7989 Secondary malignant neoplasm of other specified sites: Secondary | ICD-10-CM | POA: Insufficient documentation

## 2019-05-26 DIAGNOSIS — Z5112 Encounter for antineoplastic immunotherapy: Secondary | ICD-10-CM | POA: Insufficient documentation

## 2019-05-26 DIAGNOSIS — I480 Paroxysmal atrial fibrillation: Secondary | ICD-10-CM | POA: Diagnosis not present

## 2019-05-26 DIAGNOSIS — C3490 Malignant neoplasm of unspecified part of unspecified bronchus or lung: Secondary | ICD-10-CM

## 2019-05-26 DIAGNOSIS — I4891 Unspecified atrial fibrillation: Secondary | ICD-10-CM | POA: Diagnosis not present

## 2019-05-26 DIAGNOSIS — Z79899 Other long term (current) drug therapy: Secondary | ICD-10-CM | POA: Diagnosis present

## 2019-05-26 DIAGNOSIS — R630 Anorexia: Secondary | ICD-10-CM | POA: Insufficient documentation

## 2019-05-26 DIAGNOSIS — I255 Ischemic cardiomyopathy: Secondary | ICD-10-CM | POA: Insufficient documentation

## 2019-05-26 DIAGNOSIS — R634 Abnormal weight loss: Secondary | ICD-10-CM | POA: Diagnosis not present

## 2019-05-26 DIAGNOSIS — Z794 Long term (current) use of insulin: Secondary | ICD-10-CM | POA: Insufficient documentation

## 2019-05-26 DIAGNOSIS — I2510 Atherosclerotic heart disease of native coronary artery without angina pectoris: Secondary | ICD-10-CM | POA: Insufficient documentation

## 2019-05-26 DIAGNOSIS — C3411 Malignant neoplasm of upper lobe, right bronchus or lung: Secondary | ICD-10-CM | POA: Diagnosis not present

## 2019-05-26 LAB — CBC WITH DIFFERENTIAL (CANCER CENTER ONLY)
Abs Immature Granulocytes: 0.04 10*3/uL (ref 0.00–0.07)
Basophils Absolute: 0.1 10*3/uL (ref 0.0–0.1)
Basophils Relative: 1 %
Eosinophils Absolute: 0.2 10*3/uL (ref 0.0–0.5)
Eosinophils Relative: 3 %
HCT: 38.2 % — ABNORMAL LOW (ref 39.0–52.0)
Hemoglobin: 12.3 g/dL — ABNORMAL LOW (ref 13.0–17.0)
Immature Granulocytes: 1 %
Lymphocytes Relative: 12 %
Lymphs Abs: 1 10*3/uL (ref 0.7–4.0)
MCH: 28.3 pg (ref 26.0–34.0)
MCHC: 32.2 g/dL (ref 30.0–36.0)
MCV: 87.8 fL (ref 80.0–100.0)
Monocytes Absolute: 0.8 10*3/uL (ref 0.1–1.0)
Monocytes Relative: 10 %
Neutro Abs: 6.4 10*3/uL (ref 1.7–7.7)
Neutrophils Relative %: 73 %
Platelet Count: 237 10*3/uL (ref 150–400)
RBC: 4.35 MIL/uL (ref 4.22–5.81)
RDW: 14.4 % (ref 11.5–15.5)
WBC Count: 8.6 10*3/uL (ref 4.0–10.5)
nRBC: 0 % (ref 0.0–0.2)

## 2019-05-26 LAB — CMP (CANCER CENTER ONLY)
ALT: 21 U/L (ref 0–44)
AST: 16 U/L (ref 15–41)
Albumin: 3.5 g/dL (ref 3.5–5.0)
Alkaline Phosphatase: 97 U/L (ref 38–126)
Anion gap: 10 (ref 5–15)
BUN: 18 mg/dL (ref 8–23)
CO2: 24 mmol/L (ref 22–32)
Calcium: 9.5 mg/dL (ref 8.9–10.3)
Chloride: 96 mmol/L — ABNORMAL LOW (ref 98–111)
Creatinine: 1.69 mg/dL — ABNORMAL HIGH (ref 0.61–1.24)
GFR, Est AFR Am: 44 mL/min — ABNORMAL LOW (ref >60)
GFR, Estimated: 38 mL/min — ABNORMAL LOW (ref >60)
Glucose, Bld: 462 mg/dL — ABNORMAL HIGH (ref 70–99)
Potassium: 5.4 mmol/L — ABNORMAL HIGH (ref 3.5–5.1)
Sodium: 130 mmol/L — ABNORMAL LOW (ref 135–145)
Total Bilirubin: 0.7 mg/dL (ref 0.3–1.2)
Total Protein: 7.1 g/dL (ref 6.5–8.1)

## 2019-05-26 LAB — TSH: TSH: 1.38 u[IU]/mL (ref 0.320–4.118)

## 2019-05-29 ENCOUNTER — Encounter: Payer: Self-pay | Admitting: Physician Assistant

## 2019-05-29 ENCOUNTER — Inpatient Hospital Stay (HOSPITAL_BASED_OUTPATIENT_CLINIC_OR_DEPARTMENT_OTHER): Payer: Medicare HMO | Admitting: Physician Assistant

## 2019-05-29 ENCOUNTER — Inpatient Hospital Stay: Payer: Medicare HMO

## 2019-05-29 ENCOUNTER — Other Ambulatory Visit: Payer: Self-pay | Admitting: Physician Assistant

## 2019-05-29 ENCOUNTER — Telehealth: Payer: Self-pay | Admitting: Medical Oncology

## 2019-05-29 ENCOUNTER — Telehealth: Payer: Self-pay | Admitting: Physician Assistant

## 2019-05-29 ENCOUNTER — Telehealth: Payer: Self-pay | Admitting: Internal Medicine

## 2019-05-29 ENCOUNTER — Other Ambulatory Visit: Payer: Self-pay

## 2019-05-29 VITALS — BP 109/49 | HR 64 | Temp 98.2°F | Resp 18 | Ht 71.0 in | Wt 157.6 lb

## 2019-05-29 DIAGNOSIS — E1159 Type 2 diabetes mellitus with other circulatory complications: Secondary | ICD-10-CM

## 2019-05-29 DIAGNOSIS — E785 Hyperlipidemia, unspecified: Secondary | ICD-10-CM

## 2019-05-29 DIAGNOSIS — I110 Hypertensive heart disease with heart failure: Secondary | ICD-10-CM

## 2019-05-29 DIAGNOSIS — R634 Abnormal weight loss: Secondary | ICD-10-CM

## 2019-05-29 DIAGNOSIS — Z8719 Personal history of other diseases of the digestive system: Secondary | ICD-10-CM

## 2019-05-29 DIAGNOSIS — Z7901 Long term (current) use of anticoagulants: Secondary | ICD-10-CM

## 2019-05-29 DIAGNOSIS — R630 Anorexia: Secondary | ICD-10-CM

## 2019-05-29 DIAGNOSIS — E119 Type 2 diabetes mellitus without complications: Secondary | ICD-10-CM | POA: Diagnosis not present

## 2019-05-29 DIAGNOSIS — C7989 Secondary malignant neoplasm of other specified sites: Secondary | ICD-10-CM

## 2019-05-29 DIAGNOSIS — C3411 Malignant neoplasm of upper lobe, right bronchus or lung: Secondary | ICD-10-CM | POA: Diagnosis not present

## 2019-05-29 DIAGNOSIS — I252 Old myocardial infarction: Secondary | ICD-10-CM

## 2019-05-29 DIAGNOSIS — Z794 Long term (current) use of insulin: Secondary | ICD-10-CM

## 2019-05-29 DIAGNOSIS — I4891 Unspecified atrial fibrillation: Secondary | ICD-10-CM | POA: Diagnosis not present

## 2019-05-29 DIAGNOSIS — I255 Ischemic cardiomyopathy: Secondary | ICD-10-CM

## 2019-05-29 DIAGNOSIS — Z79899 Other long term (current) drug therapy: Secondary | ICD-10-CM

## 2019-05-29 DIAGNOSIS — Z5112 Encounter for antineoplastic immunotherapy: Secondary | ICD-10-CM | POA: Diagnosis not present

## 2019-05-29 DIAGNOSIS — I2510 Atherosclerotic heart disease of native coronary artery without angina pectoris: Secondary | ICD-10-CM

## 2019-05-29 DIAGNOSIS — C3490 Malignant neoplasm of unspecified part of unspecified bronchus or lung: Secondary | ICD-10-CM

## 2019-05-29 DIAGNOSIS — I480 Paroxysmal atrial fibrillation: Secondary | ICD-10-CM

## 2019-05-29 MED ORDER — SODIUM CHLORIDE 0.9 % IV SOLN
Freq: Once | INTRAVENOUS | Status: AC
  Administered 2019-05-29: 10:00:00 via INTRAVENOUS
  Filled 2019-05-29: qty 250

## 2019-05-29 MED ORDER — INSULIN REGULAR HUMAN 100 UNIT/ML IJ SOLN
10.0000 [IU] | Freq: Once | INTRAMUSCULAR | Status: AC
  Administered 2019-05-29: 10 [IU] via SUBCUTANEOUS
  Filled 2019-05-29: qty 10

## 2019-05-29 MED ORDER — SODIUM CHLORIDE 0.9 % IV SOLN
1200.0000 mg | Freq: Once | INTRAVENOUS | Status: AC
  Administered 2019-05-29: 1200 mg via INTRAVENOUS
  Filled 2019-05-29: qty 20

## 2019-05-29 MED ORDER — INSULIN REGULAR HUMAN 100 UNIT/ML IJ SOLN
10.0000 [IU] | Freq: Once | INTRAMUSCULAR | Status: AC
  Filled 2019-05-29: qty 10

## 2019-05-29 NOTE — Telephone Encounter (Signed)
Spoke to the patient's wife and updated her on what was discussed at today's visit as written in today's progress note.

## 2019-05-29 NOTE — Progress Notes (Signed)
[name] OFFICE PROGRESS NOTE  Street, [place], MD [place] [address]  DIAGNOSIS: Extensive stage (T1 a, N2, M1a)extensive stage small cell lung cancer presented with right lung nodules in addition to mediastinal lymphadenopathy as well as pericardial metastasis diagnosed in October 2019  PRIOR THERAPY: None  CURRENT THERAPY: Palliative systemic chemotherapy with carboplatin for AUC of 5 on day 1, etoposide 100 mg/M2 on days 1, 2 and 3 in addition to Tecentriq (Atezolizumab) 1200 mg IV every 3 weeks.  Status post 11 cycles.  Starting from cycle #5 the patient will be treated with maintenance immunotherapy with Tecentriq 1200 mg IV every 3 weeks.  INTERVAL HISTORY: Gregory Bates 78 y.o. male returns to the clinic for a follow-up visit.  The patient is feeling fair today without any concerning complaints except for a diminshed appetite and weight loss.  The patient has lost 13 pounds since his last appointment last month.  He drinks 1 low sugar Ensure per day due to his medical diagnoses of diabetes. He takes 20 mg of lasix daily but denies any recent swelling.  Otherwise, the patient is tolerating treatment with immunotherapy fairly well without any adverse effects.  He denies any fevers, chills, or night sweats.  He denies any changes with his breathing and denies any chest pain, hemoptysis, or cough.  He reports his baseline shortness of breath for which he is on 2 L of home oxygen.  He denies any nausea, vomiting, diarrhea, constipation, or abdominal pain.  He denies any headaches but endorses some visual blurring.  The patient has an appointment with his eye doctor this Friday. The patient has a history of diabetes with frequent hyperglycemia on routine labs.  He takes metformin as well as insulin.  He states that he checks his blood sugars at home. He denies any polyphagia, polyuria, or polydipsia.  He states that his blood sugar was 326 this morning and  took 15 units of insulin.  The patient recently had a restaging CT scan performed.  He is here today for evaluation and to review his scan results before starting cycle #12.  MEDICAL HISTORY: Past Medical History:  Diagnosis Date  . Acute renal insufficiency    a. During 11/2013 adm with atrial flutter.  . Anemia, unspecified   . Atrial fibrillation ([place])   . CAD (coronary artery disease), native coronary artery    a. s/p cath May 2011 >> DES distal RCA;  b. 01/2013 NSTEMI >> OM1 99p (2.75x12 Promus Premier DES);  c. LHC (2/15): PCI: Promus DES to dist RCA ;  d. LHC 1/16 - dLM 7, oLAD 30, small D1 tandem 9, pOM1 stent ok, mOM 50-60, dOM bifurcation 50-60 in both vessels, AV 68-70 jailed by stent, pRCA 66 mRCA stent ok, dRCA stent ok, PLB smal 50, dPDA 60, 80; EF 35-40 >> med Rx - PCI of PDA if angina  . Chronic combined systolic and diastolic CHF (congestive heart failure) ([place]) 12/23/2015   Echo 2/17: Mild LVH, EF 45-50%, inferior akinesis, grade 2 diastolic dysfunction, mild AI, mild MR, mild LAE, PASP 44 mmHg   . Colorectal polyps 2002  . Diabetes mellitus    AODM  . Erectile dysfunction   . History of echocardiogram    a. 01/2013 Echo: EF 55%, mid-dist inflat HK, Gr1 DD, Triv AI/TR, Mild MR.;  b.  Echo (1/15): Mild LVH, EF 55%, inferolateral hypokinesis, grade 1 diastolic dysfunction, mild AI, trivial MR, moderate LAE, PASP 36  mmHg   . Hx of cardiovascular stress test    Stress myoview 08/07/13 with LVEF 44%, inferior scar, no ischemia  . Hyperlipidemia   . Hypertension   . Hyponatremia   . Ischemic cardiomyopathy    a. EF 35-40% by LHC in 1/16; b. Echo 2/17: Mild LVH, EF 45-50%, inferior akinesis, grade 2 diastolic dysfunction, mild AI, mild MR, mild LAE, PASP 44 mmHg  . Myocardial infarction ([place])   . Oxygen deficiency   . PAF (paroxysmal atrial fibrillation) ([place])    a. Dx 11/2013. Spont conv. Placed on eliquis.  . Small cell lung cancer in adult [facility]) 07/28/2018    ALLERGIES:   is allergic to brilinta [ticagrelor] and crestor [rosuvastatin].  MEDICATIONS:  Current Outpatient Medications  Medication Sig Dispense Refill  . acetaminophen (TYLENOL) 325 MG tablet Take 2 tablets (650 mg total) by mouth every 6 (six) hours as needed for mild pain.    [name] Kitchen albuterol (PROVENTIL HFA;VENTOLIN HFA) 108 (90 Base) MCG/ACT inhaler Inhale 2 puffs into the lungs every 6 (six) hours as needed for wheezing or shortness of breath. 1 Inhaler 2  . amiodarone (PACERONE) 200 MG tablet TAKE 1 TABLET (200 MG TOTAL) BY MOUTH DAILY. 90 tablet 1  . apixaban (ELIQUIS) 5 MG TABS tablet Take 1 tablet (5 mg total) by mouth 2 (two) times daily. 180 tablet 2  . aspirin EC 81 MG EC tablet Take 1 tablet (81 mg total) by mouth daily.    [name] Kitchen atorvastatin (LIPITOR) 20 MG tablet TAKE 1 TABLET EVERY DAY 90 tablet 3  . cholecalciferol (VITAMIN D) 1000 UNITS tablet Take 1,000 Units by mouth every evening.     . clopidogrel (PLAVIX) 75 MG tablet TAKE 1 TABLET EVERY DAY WITH BREAKFAST 90 tablet 1  . doxycycline (VIBRA-TABS) 100 MG tablet Take 1 tablet (100 mg total) by mouth 2 (two) times daily. 20 tablet 0  . fexofenadine (ALLEGRA) 180 MG tablet Take 180 mg by mouth daily as needed for allergies.     . fluticasone (FLONASE) 50 MCG/ACT nasal spray Place 1 spray into both nostrils daily as needed for allergies.     . furosemide (LASIX) 20 MG tablet TAKE 1 TABLET (20 MG TOTAL) BY MOUTH DAILY. 90 tablet 1  . guaiFENesin (MUCINEX) 600 MG 12 hr tablet Take 1 tablet (600 mg total) by mouth 2 (two) times daily as needed for cough or to loosen phlegm.    . insulin NPH-regular Human (70-30) 100 UNIT/ML injection Inject 15 Units into the skin daily with supper.    . Iron Combinations (IRON COMPLEX PO) Take 1 tablet by mouth daily.    . isosorbide mononitrate (IMDUR) 60 MG 24 hr tablet TAKE 1 TABLET EVERY DAY 90 tablet 2  . metFORMIN (GLUCOPHAGE) 1000 MG tablet Take 1,000 mg by mouth daily with breakfast.    . metoprolol  tartrate (LOPRESSOR) 25 MG tablet TAKE 1 TABLET TWICE DAILY 180 tablet 2  . Multiple Vitamin (MULTIVITAMIN) tablet Take 1 tablet by mouth at bedtime.     . nitroGLYCERIN (NITROSTAT) 0.4 MG SL tablet Place 1 tablet (0.4 mg total) under the tongue every 5 (five) minutes as needed for chest pain. 25 tablet 2  . predniSONE (DELTASONE) 20 MG tablet Take 4 tabs po qd x 7 days, followed by 3 tab po qd x 7 days, followed by 2 tab po qd x 7 days, then one tab po qd x 7 days, then 1/2 tab po qd x one week 75 tablet  0  . vitamin E 100 UNIT capsule Take 100 Units by mouth at bedtime.     . ondansetron (ZOFRAN ODT) 4 MG disintegrating tablet Take 1 tablet (4 mg total) by mouth every 8 (eight) hours as needed for nausea or vomiting. (Patient not taking: Reported on 11/21/2018) 20 tablet 0  . prochlorperazine (COMPAZINE) 10 MG tablet Take 1 tablet (10 mg total) by mouth every 6 (six) hours as needed for nausea or vomiting. (Patient not taking: Reported on 11/21/2018) 30 tablet 0   Current Facility-Administered Medications  Medication Dose Route Frequency Provider Last Rate Last Dose  . insulin regular (NOVOLIN R) 100 units/mL injection 10 Units  10 Units Subcutaneous Once ,  L, PA-C       Facility-Administered Medications Ordered in Other Visits  Medication Dose Route Frequency Provider Last Rate Last Dose  . atezolizumab (TECENTRIQ) 1,200 mg in sodium chloride 0.9 % 250 mL chemo infusion  1,200 mg Intravenous Once Curt Bears, MD        SURGICAL HISTORY:  Past Surgical History:  Procedure Laterality Date  . BIOPSY  08/01/2018   Procedure: BIOPSY OF PERICARDIAL LYMPH NODE AND HILAR LYMPH NODE;  Surgeon: Grace Isaac, MD;  Location: [facility];  Service: Open Heart Surgery;;  . CLIPPING OF ATRIAL APPENDAGE Left 08/01/2018   Procedure: CLIPPING OF ATRIAL APPENDAGE;  Surgeon: Grace Isaac, MD;  Location: [facility];  Service: Open Heart Surgery;  Laterality: Left;  . COLONOSCOPY W/  POLYPECTOMY  2002   [facility]  . COLONOSCOPY W/ POLYPECTOMY  2004; 2007 negative  . CORONARY ANGIOPLASTY  12/26/2013  . CORONARY ARTERY BYPASS GRAFT N/A 08/01/2018   Procedure: CORONARY ARTERY BYPASS GRAFTING (CABG) TIMES THREE USING LEFT INTERNAL MAMMARY ARTERY TO LAD, RIGHT GREATER SAPHENOUS VEIN GRAFT TO OM AND RCA, VEIN HARVESTED ENDOSCOPICALLY;  Surgeon: Grace Isaac, MD;  Location: [facility];  Service: Open Heart Surgery;  Laterality: N/A;  . CORONARY STENT PLACEMENT  2007, 2011  . infantile paralysis facial asymmetry    . LEFT HEART CATH AND CORONARY ANGIOGRAPHY N/A 05/18/2017   Procedure: Left Heart Cath and Coronary Angiography;  Surgeon: Martinique, Peter M, MD;  Location: [facility] CV LAB;  Service: Cardiovascular;  Laterality: N/A;  . LEFT HEART CATH AND CORONARY ANGIOGRAPHY N/A 07/27/2018   Procedure: LEFT HEART CATH AND CORONARY ANGIOGRAPHY;  Surgeon: Martinique, Peter M, MD;  Location: [facility];  Service: Cardiovascular;  Laterality: N/A;  . LEFT HEART CATHETERIZATION WITH CORONARY ANGIOGRAM N/A 10/02/2012   Procedure: LEFT HEART CATHETERIZATION WITH CORONARY ANGIOGRAM;  Surgeon: Peter M Martinique, MD;  Location: [facility] CATH LAB;  Service: Cardiovascular;  Laterality: N/A;  . LEFT HEART CATHETERIZATION WITH CORONARY ANGIOGRAM N/A 02/20/2013   Procedure: LEFT HEART CATHETERIZATION WITH CORONARY ANGIOGRAM;  Surgeon: Burnell Blanks, MD;  Location: [facility] CATH LAB;  Service: Cardiovascular;  Laterality: N/A;  . LEFT HEART CATHETERIZATION WITH CORONARY ANGIOGRAM N/A 12/26/2013   Procedure: LEFT HEART CATHETERIZATION WITH CORONARY ANGIOGRAM;  Surgeon: Burnell Blanks, MD;  Location: [facility] CATH LAB;  Service: Cardiovascular;  Laterality: N/A;  . LEFT HEART CATHETERIZATION WITH CORONARY ANGIOGRAM N/A 11/28/2014   Procedure: LEFT HEART CATHETERIZATION WITH CORONARY ANGIOGRAM;  Surgeon: Burnell Blanks, MD;  Location: [facility] CATH LAB;  Service: Cardiovascular;  Laterality: N/A;  .  PERCUTANEOUS CORONARY STENT INTERVENTION (PCI-S)  10/02/2012   Procedure: PERCUTANEOUS CORONARY STENT INTERVENTION (PCI-S);  Surgeon: Peter M Martinique, MD;  Location: [facility] CATH LAB;  Service: Cardiovascular;;  .  PERCUTANEOUS CORONARY STENT INTERVENTION (PCI-S)  02/20/2013   Procedure: PERCUTANEOUS CORONARY STENT INTERVENTION (PCI-S);  Surgeon: Burnell Blanks, MD;  Location: [facility] CATH LAB;  Service: Cardiovascular;;  . PERCUTANEOUS CORONARY STENT INTERVENTION (PCI-S)  12/26/2013   Procedure: PERCUTANEOUS CORONARY STENT INTERVENTION (PCI-S);  Surgeon: Burnell Blanks, MD;  Location: [facility] CATH LAB;  Service: Cardiovascular;;  . TEE WITHOUT CARDIOVERSION N/A 08/01/2018   Procedure: TRANSESOPHAGEAL ECHOCARDIOGRAM (TEE);  Surgeon: Grace Isaac, MD;  Location: [facility];  Service: Open Heart Surgery;  Laterality: N/A;  . WISDOM TOOTH EXTRACTION      REVIEW OF SYSTEMS:   Review of Systems  Constitutional: Positive for diminished appetite and weight loss. Negative for appetite change, chills, fatigue, and fever.  HENT:   Negative for mouth sores, nosebleeds, sore throat and trouble swallowing.   Eyes: Positive for visual blurring. Negative for icterus, visual field defects, or diplopia.  Respiratory: Positive for baseline shortness of breath. Negative for cough, hemoptysis, and wheezing.   Cardiovascular: Negative for chest pain and leg swelling.  Gastrointestinal: Negative for abdominal pain, constipation, diarrhea, nausea and vomiting.  Genitourinary: Negative for bladder incontinence, difficulty urinating, dysuria, frequency and hematuria.   Musculoskeletal: Negative for back pain, gait problem, neck pain and neck stiffness.  Skin: Negative for itching and rash.  Neurological: Negative for dizziness, extremity weakness, gait problem, headaches, light-headedness and seizures.  Hematological: Negative for adenopathy. Does not bruise/bleed easily.  Psychiatric/Behavioral: Negative for confusion,  depression and sleep disturbance. The patient is not nervous/anxious.     PHYSICAL EXAMINATION:  Blood pressure (!) 109/49, pulse 64, temperature 98.2 F (36.8 C), temperature source Oral, resp. rate 18, height 5\' 11"  (1.803 m), weight 157 lb 9.6 oz (71.5 kg), SpO2 100 %.  ECOG PERFORMANCE STATUS: 1 - Symptomatic but completely ambulatory  Physical Exam  Constitutional: Oriented to person, place, and time and thin appearing and in no distress.  HENT:  Head: Normocephalic and atraumatic.  Mouth/Throat: Oropharynx is clear and moist. No oropharyngeal exudate.  Eyes: Conjunctivae are normal. Right eye exhibits no discharge. Left eye exhibits no discharge. No scleral icterus.  Neck: Normal range of motion. Neck supple.  Cardiovascular: Normal rate, regular rhythm, normal heart sounds and intact distal pulses.   Pulmonary/Chest: Effort normal. Quiet breath sounds in all lung fields. No respiratory distress. No wheezes. No rales.  Abdominal: Soft. Bowel sounds are normal. Exhibits no distension and no mass. There is no tenderness.  Musculoskeletal: Normal range of motion. Exhibits no edema.  Lymphadenopathy:    No cervical adenopathy.  Neurological: Alert and oriented to person, place, and time. Exhibits normal muscle tone. Gait normal. Coordination normal.  Skin: Skin is warm and dry. No rash noted. Not diaphoretic. No erythema. No pallor.  Psychiatric: Mood, memory and judgment normal.  Vitals reviewed.  LABORATORY DATA: Lab Results  Component Value Date   WBC 8.6 05/26/2019   HGB 12.3 (L) 05/26/2019   HCT 38.2 (L) 05/26/2019   MCV 87.8 05/26/2019   PLT 237 05/26/2019      Chemistry      Component Value Date/Time   NA 130 (L) 05/26/2019 0903   NA 126 (L) 08/24/2018 1237   K 5.4 (H) 05/26/2019 0903   CL 96 (L) 05/26/2019 0903   CO2 24 05/26/2019 0903   BUN 18 05/26/2019 0903   BUN 19 08/24/2018 1237   CREATININE 1.69 (H) 05/26/2019 0903   CREATININE 1.67 (H) 10/30/2016 1033       Component Value Date/Time  CALCIUM 9.5 05/26/2019 0903   ALKPHOS 97 05/26/2019 0903   AST 16 05/26/2019 0903   ALT 21 05/26/2019 0903   BILITOT 0.7 05/26/2019 0903       RADIOGRAPHIC STUDIES:  Ct Chest Wo Contrast  Result Date: 05/25/2019 CLINICAL DATA:  Small-cell lung cancer.  Restaging. EXAM: CT CHEST WITHOUT CONTRAST TECHNIQUE: Multidetector CT imaging of the chest was performed following the standard protocol without IV contrast. COMPARISON:  04/14/2019 FINDINGS: Cardiovascular: The heart size is normal. No substantial pericardial effusion. Status post CABG. Atherosclerotic calcification is noted in the wall of the thoracic aorta. Mediastinum/Nodes: Scattered small mediastinal lymph nodes are stable. No evidence for gross hilar lymphadenopathy although assessment is limited by the lack of intravenous contrast on today's study. The esophagus has normal imaging features. There is no axillary lymphadenopathy. Lungs/Pleura: The central tracheobronchial airways are patent. Centrilobular and paraseptal emphysema evident. Consolidative opacity in the anterior right upper lobe is similar to prior. The consolidative patchy opacity seen previously in the lower lobes bilaterally has decreased and become less confluent in the interval with areas of ground-glass attenuation now seen more more dense consolidative change was evident previously. Interval development of new patchy consolidative and ground-glass opacity noted in the posterior left upper lobe. No pleural effusion. Upper Abdomen: Unremarkable. Musculoskeletal: No worrisome lytic or sclerotic osseous abnormality. IMPRESSION: 1. Overall generalized trend towards improvement of the patchy bilateral consolidative airspace opacity seen on the previous study with some new disease noted in the posterior left upper lobe on the current exam. Imaging features may reflect a waxing/waning course of infectious/inflammatory etiology with drug toxicity not  excluded. Recurrent disease considered less likely but not excluded. 2.  Aortic Atherosclerois (ICD10-170.0) 3.  Emphysema. ([id number]) Electronically Signed   By: Misty Stanley M.D.   On: 05/25/2019 16:44     ASSESSMENT/PLAN:  This is a very pleasant 78 year old Caucasian male diagnosed with extensive stage small cell lung cancer.  He presented with right lung nodules in addition to mediastinal lymphadenopathy as well as a pericardial metastasis.  He was diagnosed in October 2019.  The patient previously underwent treatment with carboplatin, etoposide, and Tecentriq.  He is status post 11 cycles. Starting from cycle #7, he has been undergoing maintenance with Tecentriq 1200 mg IV every 3 weeks.  He has been tolerating it well without any adverse effects.  The patient recently had a restaging CT scan performed.  Dr. Julien Nordmann personally and independently reviewed the scan and discussed results with the patient today.  The scan showed no evidence of disease progression.  Additionally, the inflammation in the lungs seen on prior CT showed an overall  improvement.  Dr. Julien Nordmann recommends that the patient continue with cycle #12 today as scheduled.  We will see the patient back for follow-up visit in 3 weeks for evaluation before starting cycle #13.  Regarding the patient's weight loss, the patient was encouraged to increase snacking between meals. Of course, due to his congestive heart failure, the patient was advised to avoid foods high in salt.  The patient is interested in speaking with our nutritionist onsite for further evaluation and recommendations.  I have sent a referral for their services.  Regarding the patient's diabetes, a fingerstick glucose was obtained while in the clinic which read his blood glucose at 380. The patient reported that he took 15 units 2 hours ago. The patient will receive an additional 10 units of insulin while in the clinic today.  We will recheck his blood sugar in  1  hour.  The patient was encouraged to monitor his blood sugars closely at home and to take his medications as prescribed.  I discussed concerning signs and symptoms of hyperglycemia with the patient which warrant medical evaluation.  The patient was advised to call immediately if he has any concerning symptoms in the interval. The patient voices understanding of current disease status and treatment options and is in agreement with the current care plan. All questions were answered. The patient knows to call the clinic with any problems, questions or concerns. We can certainly see the patient much sooner if necessary   Orders Placed This Encounter  Procedures  . Ambulatory referral to Nutrition and Diabetic E    Referral Priority:   Routine    Referral Type:   Consultation    Referral Reason:   Specialty Services Required    Number of Visits Requested:   [place], PA-C 05/29/19  ADDENDUM: Hematology/Oncology Attending: I had a face-to-face encounter with the patient today.  I recommended his care plan.  This is a very pleasant 78 years old white male with extensive stage small cell lung cancer status post induction systemic chemotherapy with carboplatin, etoposide and Tecentriq for 4 cycles and starting from cycle #5 he is currently on maintenance treatment with Tecentriq status post 7 cycles.  The patient has been tolerating this treatment well with no concerning adverse effects. He continues to have uncontrolled diabetes mellitus and his blood sugar has been elevated today. I recommended for the patient to proceed with cycle #8 of his maintenance treatment today as planned. For the hyperglycemia, we will give the patient 10 units of regular insulin in the clinic and he was advised to monitor his blood sugar closely at home. He will come back for follow-up visit in 3 weeks for evaluation before the next cycle of his treatment. He was advised to call immediately if he  has any concerning symptoms in the interval.  Disclaimer: This note was dictated with voice recognition software. Similar sounding words can inadvertently be transcribed and may be missed upon review. Eilleen Kempf, MD 05/29/19

## 2019-05-29 NOTE — Progress Notes (Signed)
Attempted to re-establish IV access after pt accidentally removed his PIV while in the restroom.  Unsuccessful attempt in RFA.  IV Team order placed and paged.  Rechecked blood sugar, CBG of 331.  Alerted RN Steffanie Dunn.  RN Lanelle Bal (former IV team) made attempt for IV in RFA, unsuccessful.  Alerted PA Cassie of multiple attempts, accidental removal of IV by patient, and of delay in IV team arrival, as well as of CBG of 331 taken at 1130.  At this time pt is waiting for IV team and/or for PA's decision on tx plan for today.  Pt aware and verbalized understanding.

## 2019-05-29 NOTE — Telephone Encounter (Signed)
Gregory Bates,  "caregiver ",  is asking for update from today's visit. He is with pt's wife. He stated he is rapidly declining and wants to know the  Plan of care.

## 2019-05-29 NOTE — Patient Instructions (Signed)
Gregory Bates Discharge Instructions for Patients Receiving Chemotherapy  Today you received the following chemotherapy agents:  atezolizumab [name])  To help prevent nausea and vomiting after your treatment, we encourage you to take your nausea medication as prescribed.   If you develop nausea and vomiting that is not controlled by your nausea medication, call the clinic.   BELOW ARE SYMPTOMS THAT SHOULD BE REPORTED IMMEDIATELY:  *FEVER GREATER THAN 100.5 F  *CHILLS WITH OR WITHOUT FEVER  NAUSEA AND VOMITING THAT IS NOT CONTROLLED WITH YOUR NAUSEA MEDICATION  *UNUSUAL SHORTNESS OF BREATH  *UNUSUAL BRUISING OR BLEEDING  TENDERNESS IN MOUTH AND THROAT WITH OR WITHOUT PRESENCE OF ULCERS  *URINARY PROBLEMS  *BOWEL PROBLEMS  UNUSUAL RASH Items with * indicate a potential emergency and should be followed up as soon as possible.  Feel free to call the clinic should you have any questions or concerns. The clinic phone number is [phone].  Please show the [place] at check-in to the Emergency Department and triage nurse.

## 2019-05-29 NOTE — Telephone Encounter (Signed)
Scheduled appt per 7/27 los - pt to get an updated schedule in treatment area.

## 2019-05-29 NOTE — Progress Notes (Signed)
Per Cassie Hellingoepter, PA ok to treat with Scr 1.69.  BS from 05/26/19 in the 400s. Pt stated BS was 326 this AM and he self administered 15 units. Pt states this was possibly 2 hours prior (8am). Repeat fingerstick at 10am was 380. Cassie Hellingoepter, PA notified and and order for insulin was entered.  Will recheck pt's BG 1 hour after administration.   Cassie Hellingoepter, PA notified of CBG.   Pt's IV was accidentally removed. IV infusion paused while waiting for IV to be re-established. IV team notified.   IV team able to re-establish IV. Infusion restarted. Cassie H, PA notified.

## 2019-05-30 LAB — GLUCOSE, CAPILLARY
Glucose-Capillary: 331 mg/dL — ABNORMAL HIGH (ref 70–99)
Glucose-Capillary: 380 mg/dL — ABNORMAL HIGH (ref 70–99)

## 2019-06-01 DIAGNOSIS — H35363 Drusen (degenerative) of macula, bilateral: Secondary | ICD-10-CM | POA: Diagnosis not present

## 2019-06-01 DIAGNOSIS — E119 Type 2 diabetes mellitus without complications: Secondary | ICD-10-CM | POA: Diagnosis not present

## 2019-06-01 DIAGNOSIS — H25813 Combined forms of age-related cataract, bilateral: Secondary | ICD-10-CM | POA: Diagnosis not present

## 2019-06-04 ENCOUNTER — Emergency Department (HOSPITAL_COMMUNITY): Payer: Medicare HMO

## 2019-06-04 ENCOUNTER — Other Ambulatory Visit: Payer: Self-pay

## 2019-06-04 ENCOUNTER — Inpatient Hospital Stay (HOSPITAL_COMMUNITY)
Admission: EM | Admit: 2019-06-04 | Discharge: 2019-06-08 | DRG: 054 | Disposition: A | Discharge status: Home Health | Payer: Medicare HMO | Attending: Internal Medicine | Admitting: Internal Medicine

## 2019-06-04 ENCOUNTER — Encounter (HOSPITAL_COMMUNITY): Payer: Self-pay | Admitting: *Deleted

## 2019-06-04 DIAGNOSIS — I130 Hypertensive heart and chronic kidney disease with heart failure and stage 1 through stage 4 chronic kidney disease, or unspecified chronic kidney disease: Secondary | ICD-10-CM | POA: Diagnosis present

## 2019-06-04 DIAGNOSIS — Z7901 Long term (current) use of anticoagulants: Secondary | ICD-10-CM

## 2019-06-04 DIAGNOSIS — Z7982 Long term (current) use of aspirin: Secondary | ICD-10-CM

## 2019-06-04 DIAGNOSIS — C7989 Secondary malignant neoplasm of other specified sites: Secondary | ICD-10-CM | POA: Diagnosis not present

## 2019-06-04 DIAGNOSIS — I10 Essential (primary) hypertension: Secondary | ICD-10-CM | POA: Diagnosis present

## 2019-06-04 DIAGNOSIS — Z888 Allergy status to other drugs, medicaments and biological substances status: Secondary | ICD-10-CM

## 2019-06-04 DIAGNOSIS — I2510 Atherosclerotic heart disease of native coronary artery without angina pectoris: Secondary | ICD-10-CM | POA: Diagnosis present

## 2019-06-04 DIAGNOSIS — C3490 Malignant neoplasm of unspecified part of unspecified bronchus or lung: Secondary | ICD-10-CM | POA: Diagnosis present

## 2019-06-04 DIAGNOSIS — R079 Chest pain, unspecified: Secondary | ICD-10-CM | POA: Diagnosis not present

## 2019-06-04 DIAGNOSIS — E1122 Type 2 diabetes mellitus with diabetic chronic kidney disease: Secondary | ICD-10-CM | POA: Diagnosis present

## 2019-06-04 DIAGNOSIS — D329 Benign neoplasm of meninges, unspecified: Secondary | ICD-10-CM | POA: Diagnosis present

## 2019-06-04 DIAGNOSIS — N183 Chronic kidney disease, stage 3 unspecified: Secondary | ICD-10-CM | POA: Diagnosis present

## 2019-06-04 DIAGNOSIS — J439 Emphysema, unspecified: Secondary | ICD-10-CM | POA: Diagnosis not present

## 2019-06-04 DIAGNOSIS — E1165 Type 2 diabetes mellitus with hyperglycemia: Secondary | ICD-10-CM | POA: Diagnosis present

## 2019-06-04 DIAGNOSIS — C7931 Secondary malignant neoplasm of brain: Secondary | ICD-10-CM | POA: Diagnosis not present

## 2019-06-04 DIAGNOSIS — N179 Acute kidney failure, unspecified: Secondary | ICD-10-CM | POA: Diagnosis present

## 2019-06-04 DIAGNOSIS — I255 Ischemic cardiomyopathy: Secondary | ICD-10-CM | POA: Diagnosis present

## 2019-06-04 DIAGNOSIS — I5032 Chronic diastolic (congestive) heart failure: Secondary | ICD-10-CM | POA: Diagnosis not present

## 2019-06-04 DIAGNOSIS — I252 Old myocardial infarction: Secondary | ICD-10-CM

## 2019-06-04 DIAGNOSIS — I480 Paroxysmal atrial fibrillation: Secondary | ICD-10-CM | POA: Diagnosis not present

## 2019-06-04 DIAGNOSIS — S0990XA Unspecified injury of head, initial encounter: Secondary | ICD-10-CM | POA: Diagnosis not present

## 2019-06-04 DIAGNOSIS — E871 Hypo-osmolality and hyponatremia: Secondary | ICD-10-CM | POA: Diagnosis present

## 2019-06-04 DIAGNOSIS — J9610 Chronic respiratory failure, unspecified whether with hypoxia or hypercapnia: Secondary | ICD-10-CM | POA: Diagnosis present

## 2019-06-04 DIAGNOSIS — J449 Chronic obstructive pulmonary disease, unspecified: Secondary | ICD-10-CM | POA: Diagnosis present

## 2019-06-04 DIAGNOSIS — R55 Syncope and collapse: Secondary | ICD-10-CM | POA: Diagnosis not present

## 2019-06-04 DIAGNOSIS — Z8249 Family history of ischemic heart disease and other diseases of the circulatory system: Secondary | ICD-10-CM

## 2019-06-04 DIAGNOSIS — Z87891 Personal history of nicotine dependence: Secondary | ICD-10-CM

## 2019-06-04 DIAGNOSIS — C7951 Secondary malignant neoplasm of bone: Secondary | ICD-10-CM | POA: Diagnosis not present

## 2019-06-04 DIAGNOSIS — R32 Unspecified urinary incontinence: Secondary | ICD-10-CM | POA: Diagnosis present

## 2019-06-04 DIAGNOSIS — R0789 Other chest pain: Secondary | ICD-10-CM | POA: Diagnosis not present

## 2019-06-04 DIAGNOSIS — Z20828 Contact with and (suspected) exposure to other viral communicable diseases: Secondary | ICD-10-CM | POA: Diagnosis not present

## 2019-06-04 DIAGNOSIS — K08409 Partial loss of teeth, unspecified cause, unspecified class: Secondary | ICD-10-CM | POA: Diagnosis present

## 2019-06-04 DIAGNOSIS — I700 Atherosclerosis of aorta: Secondary | ICD-10-CM | POA: Diagnosis not present

## 2019-06-04 DIAGNOSIS — E785 Hyperlipidemia, unspecified: Secondary | ICD-10-CM | POA: Diagnosis not present

## 2019-06-04 DIAGNOSIS — Z800 Family history of malignant neoplasm of digestive organs: Secondary | ICD-10-CM

## 2019-06-04 DIAGNOSIS — Z79899 Other long term (current) drug therapy: Secondary | ICD-10-CM

## 2019-06-04 DIAGNOSIS — R739 Hyperglycemia, unspecified: Secondary | ICD-10-CM

## 2019-06-04 DIAGNOSIS — E860 Dehydration: Secondary | ICD-10-CM | POA: Diagnosis present

## 2019-06-04 DIAGNOSIS — R0902 Hypoxemia: Secondary | ICD-10-CM | POA: Diagnosis not present

## 2019-06-04 DIAGNOSIS — Z5112 Encounter for antineoplastic immunotherapy: Secondary | ICD-10-CM | POA: Diagnosis not present

## 2019-06-04 DIAGNOSIS — Z85118 Personal history of other malignant neoplasm of bronchus and lung: Secondary | ICD-10-CM | POA: Diagnosis not present

## 2019-06-04 DIAGNOSIS — C719 Malignant neoplasm of brain, unspecified: Secondary | ICD-10-CM | POA: Diagnosis present

## 2019-06-04 DIAGNOSIS — I959 Hypotension, unspecified: Secondary | ICD-10-CM | POA: Diagnosis not present

## 2019-06-04 DIAGNOSIS — Z955 Presence of coronary angioplasty implant and graft: Secondary | ICD-10-CM

## 2019-06-04 DIAGNOSIS — G936 Cerebral edema: Secondary | ICD-10-CM | POA: Diagnosis present

## 2019-06-04 DIAGNOSIS — E119 Type 2 diabetes mellitus without complications: Secondary | ICD-10-CM | POA: Diagnosis not present

## 2019-06-04 DIAGNOSIS — Z951 Presence of aortocoronary bypass graft: Secondary | ICD-10-CM

## 2019-06-04 DIAGNOSIS — Z03818 Encounter for observation for suspected exposure to other biological agents ruled out: Secondary | ICD-10-CM | POA: Diagnosis not present

## 2019-06-04 DIAGNOSIS — D649 Anemia, unspecified: Secondary | ICD-10-CM | POA: Diagnosis present

## 2019-06-04 DIAGNOSIS — Z7902 Long term (current) use of antithrombotics/antiplatelets: Secondary | ICD-10-CM

## 2019-06-04 DIAGNOSIS — Z8042 Family history of malignant neoplasm of prostate: Secondary | ICD-10-CM

## 2019-06-04 DIAGNOSIS — Z803 Family history of malignant neoplasm of breast: Secondary | ICD-10-CM

## 2019-06-04 DIAGNOSIS — C3411 Malignant neoplasm of upper lobe, right bronchus or lung: Secondary | ICD-10-CM | POA: Diagnosis present

## 2019-06-04 LAB — CBC
HCT: 36.9 % — ABNORMAL LOW (ref 39.0–52.0)
Hemoglobin: 11.9 g/dL — ABNORMAL LOW (ref 13.0–17.0)
MCH: 28.6 pg (ref 26.0–34.0)
MCHC: 32.2 g/dL (ref 30.0–36.0)
MCV: 88.7 fL (ref 80.0–100.0)
Platelets: 217 10*3/uL (ref 150–400)
RBC: 4.16 MIL/uL — ABNORMAL LOW (ref 4.22–5.81)
RDW: 14.7 % (ref 11.5–15.5)
WBC: 9.8 10*3/uL (ref 4.0–10.5)
nRBC: 0 % (ref 0.0–0.2)

## 2019-06-04 LAB — BASIC METABOLIC PANEL
Anion gap: 12 (ref 5–15)
BUN: 21 mg/dL (ref 8–23)
CO2: 25 mmol/L (ref 22–32)
Calcium: 9.5 mg/dL (ref 8.9–10.3)
Chloride: 95 mmol/L — ABNORMAL LOW (ref 98–111)
Creatinine, Ser: 1.7 mg/dL — ABNORMAL HIGH (ref 0.61–1.24)
GFR calc Af Amer: 44 mL/min — ABNORMAL LOW (ref >60)
GFR calc non Af Amer: 38 mL/min — ABNORMAL LOW (ref >60)
Glucose, Bld: 428 mg/dL — ABNORMAL HIGH (ref 70–99)
Potassium: 4.7 mmol/L (ref 3.5–5.1)
Sodium: 132 mmol/L — ABNORMAL LOW (ref 135–145)

## 2019-06-04 LAB — URINALYSIS, ROUTINE W REFLEX MICROSCOPIC
Bacteria, UA: NONE SEEN
Bilirubin Urine: NEGATIVE
Glucose, UA: >=500 mg/dL — AB
Ketones, ur: NEGATIVE mg/dL
Nitrite: NEGATIVE
Protein, ur: NEGATIVE mg/dL
Specific Gravity, Urine: 1.016 (ref 1.005–1.030)
pH: 5 (ref 5.0–8.0)

## 2019-06-04 LAB — TROPONIN I (HIGH SENSITIVITY)
Troponin I (High Sensitivity): 50 ng/L — ABNORMAL HIGH (ref <18)
Troponin I (High Sensitivity): 54 ng/L — ABNORMAL HIGH (ref <18)

## 2019-06-04 LAB — CORTISOL: Cortisol, Plasma: 19 ug/dL

## 2019-06-04 LAB — CBG MONITORING, ED: Glucose-Capillary: 388 mg/dL — ABNORMAL HIGH (ref 70–99)

## 2019-06-04 MED ORDER — ATORVASTATIN CALCIUM 20 MG PO TABS
20.0000 mg | ORAL_TABLET | ORAL | Status: AC
  Administered 2019-06-05: 20 mg via ORAL
  Filled 2019-06-04: qty 1

## 2019-06-04 MED ORDER — AMIODARONE HCL 200 MG PO TABS
200.0000 mg | ORAL_TABLET | Freq: Every day | ORAL | Status: DC
  Administered 2019-06-05 – 2019-06-08 (×4): 200 mg via ORAL
  Filled 2019-06-04 (×4): qty 1

## 2019-06-04 MED ORDER — CLOPIDOGREL BISULFATE 75 MG PO TABS
75.0000 mg | ORAL_TABLET | Freq: Every day | ORAL | Status: DC
  Administered 2019-06-05 – 2019-06-06 (×2): 75 mg via ORAL
  Filled 2019-06-04 (×2): qty 1

## 2019-06-04 MED ORDER — ADULT MULTIVITAMIN W/MINERALS CH
1.0000 | ORAL_TABLET | Freq: Every day | ORAL | Status: DC
  Administered 2019-06-05 – 2019-06-07 (×4): 1 via ORAL
  Filled 2019-06-04 (×4): qty 1

## 2019-06-04 MED ORDER — NITROGLYCERIN 0.4 MG SL SUBL: 0.4000 mg | SUBLINGUAL_TABLET | SUBLINGUAL | Status: DC | PRN

## 2019-06-04 MED ORDER — METOPROLOL TARTRATE 25 MG PO TABS
25.0000 mg | ORAL_TABLET | ORAL | Status: AC
  Administered 2019-06-05: 25 mg via ORAL
  Filled 2019-06-04: qty 1

## 2019-06-04 MED ORDER — ATORVASTATIN CALCIUM 20 MG PO TABS
20.0000 mg | ORAL_TABLET | Freq: Every day | ORAL | Status: DC
  Administered 2019-06-05 – 2019-06-08 (×4): 20 mg via ORAL
  Filled 2019-06-04 (×4): qty 1

## 2019-06-04 MED ORDER — DEXAMETHASONE 4 MG PO TABS
8.0000 mg | ORAL_TABLET | Freq: Three times a day (TID) | ORAL | Status: DC
  Administered 2019-06-04 – 2019-06-07 (×9): 8 mg via ORAL
  Filled 2019-06-04 (×10): qty 2

## 2019-06-04 MED ORDER — INSULIN ASPART 100 UNIT/ML ~~LOC~~ SOLN
0.0000 [IU] | SUBCUTANEOUS | Status: DC
  Administered 2019-06-04: 7 [IU] via SUBCUTANEOUS
  Administered 2019-06-05 (×2): 3 [IU] via SUBCUTANEOUS
  Administered 2019-06-05: 7 [IU] via SUBCUTANEOUS

## 2019-06-04 MED ORDER — ASPIRIN EC 81 MG PO TBEC
81.0000 mg | DELAYED_RELEASE_TABLET | Freq: Every day | ORAL | Status: DC
  Administered 2019-06-05 – 2019-06-08 (×4): 81 mg via ORAL
  Filled 2019-06-04 (×4): qty 1

## 2019-06-04 MED ORDER — VITAMIN D 25 MCG (1000 UNIT) PO TABS
1000.0000 [IU] | ORAL_TABLET | Freq: Every evening | ORAL | Status: DC
  Administered 2019-06-05 – 2019-06-08 (×4): 1000 [IU] via ORAL
  Filled 2019-06-04 (×4): qty 1

## 2019-06-04 MED ORDER — ACETAMINOPHEN 325 MG PO TABS: 650.0000 mg | ORAL_TABLET | Freq: Four times a day (QID) | ORAL | Status: DC | PRN

## 2019-06-04 MED ORDER — METOPROLOL TARTRATE 25 MG PO TABS
25.0000 mg | ORAL_TABLET | Freq: Two times a day (BID) | ORAL | Status: DC
  Administered 2019-06-05 – 2019-06-07 (×3): 25 mg via ORAL
  Filled 2019-06-04 (×5): qty 1

## 2019-06-04 MED ORDER — ONDANSETRON HCL 4 MG PO TABS: 4.0000 mg | ORAL_TABLET | Freq: Four times a day (QID) | ORAL | Status: DC | PRN

## 2019-06-04 MED ORDER — INSULIN GLARGINE 100 UNIT/ML ~~LOC~~ SOLN
20.0000 [IU] | Freq: Every day | SUBCUTANEOUS | Status: DC
  Administered 2019-06-04 – 2019-06-05 (×2): 20 [IU] via SUBCUTANEOUS
  Filled 2019-06-04 (×3): qty 0.2

## 2019-06-04 MED ORDER — ACETAMINOPHEN 650 MG RE SUPP: 650.0000 mg | Freq: Four times a day (QID) | RECTAL | Status: DC | PRN

## 2019-06-04 MED ORDER — ALBUTEROL SULFATE (2.5 MG/3ML) 0.083% IN NEBU: 2.5000 mg | INHALATION_SOLUTION | Freq: Four times a day (QID) | RESPIRATORY_TRACT | Status: DC | PRN

## 2019-06-04 MED ORDER — SODIUM CHLORIDE 0.9 % IV SOLN
INTRAVENOUS | Status: DC
  Administered 2019-06-04: 23:00:00 via INTRAVENOUS

## 2019-06-04 MED ORDER — ONDANSETRON HCL 4 MG/2ML IJ SOLN: 4.0000 mg | Freq: Four times a day (QID) | INTRAMUSCULAR | Status: DC | PRN

## 2019-06-04 MED ORDER — VITAMIN E 45 MG (100 UNIT) PO CAPS
100.0000 [IU] | ORAL_CAPSULE | Freq: Every day | ORAL | Status: DC
  Administered 2019-06-05 – 2019-06-07 (×4): 100 [IU] via ORAL
  Filled 2019-06-04 (×5): qty 1

## 2019-06-04 MED ORDER — ALBUTEROL SULFATE HFA 108 (90 BASE) MCG/ACT IN AERS: 2.0000 | INHALATION_SPRAY | Freq: Four times a day (QID) | RESPIRATORY_TRACT | Status: DC | PRN

## 2019-06-04 MED ORDER — APIXABAN 5 MG PO TABS
5.0000 mg | ORAL_TABLET | Freq: Two times a day (BID) | ORAL | Status: DC
  Administered 2019-06-05 – 2019-06-08 (×8): 5 mg via ORAL
  Filled 2019-06-04 (×8): qty 1

## 2019-06-04 NOTE — ED Triage Notes (Signed)
Pt fell in BR today at home. Wife reported Pt had Chemo a few days ago and has felt weak since then. Pt has skin tears on hand after hall. EMS reported CBG to be 501 and Pt's PCP knows the CBG's have been high. Pt takes steroids also that contribute to high CBG.

## 2019-06-04 NOTE — ED Provider Notes (Addendum)
[facility] EMERGENCY DEPARTMENT Provider Note   CSN: [id number] Arrival date & time: 06/04/19  1525    History   Chief Complaint Chief Complaint  Patient presents with   Fall    HPI Gregory Bates is a 78 y.o. male.      Loss of Consciousness Episode history:  Single Most recent episode:  Today Chronicity:  Recurrent (Happened yesterday as well) Context comment:  Trying to urinate or just after trying to urinate Ineffective treatments:  None tried Associated symptoms: malaise/fatigue   Associated symptoms: no anxiety, no chest pain, no confusion, no diaphoresis, no difficulty breathing, no dizziness, no fever, no focal sensory loss, no focal weakness, no headaches, no nausea, no palpitations, no seizures, no shortness of breath, no visual change and no vomiting   Associated symptoms comment:  Chemo earlier this week   Past Medical History:  Diagnosis Date   Acute renal insufficiency    a. During 11/2013 adm with atrial flutter.   Anemia, unspecified    Atrial fibrillation (HCC)    CAD (coronary artery disease), native coronary artery    a. s/p cath May 2011 >> DES distal RCA;  b. 01/2013 NSTEMI >> OM1 99p (2.75x12 Promus Premier DES);  c. LHC (2/15): PCI: Promus DES to dist RCA ;  d. LHC 1/16 - dLM 45, oLAD 30, small D1 tandem 30, pOM1 stent ok, mOM 31-60, dOM bifurcation 50-60 in both vessels, AV 46-70 jailed by stent, pRCA 24 mRCA stent ok, dRCA stent ok, PLB smal 50, dPDA 60, 80; EF 35-40 >> med Rx - PCI of PDA if angina   Chronic combined systolic and diastolic CHF (congestive heart failure) ([place]) 12/23/2015   Echo 2/17: Mild LVH, EF 45-50%, inferior akinesis, grade 2 diastolic dysfunction, mild AI, mild MR, mild LAE, PASP 44 mmHg    Colorectal polyps 2002   Diabetes mellitus    AODM   Erectile dysfunction    History of echocardiogram    a. 01/2013 Echo: EF 55%, mid-dist inflat HK, Gr1 DD, Triv AI/TR, Mild MR.;  b.  Echo (1/15): Mild LVH, EF  55%, inferolateral hypokinesis, grade 1 diastolic dysfunction, mild AI, trivial MR, moderate LAE, PASP 36 mmHg    Hx of cardiovascular stress test    Stress myoview 08/07/13 with LVEF 44%, inferior scar, no ischemia   Hyperlipidemia    Hypertension    Hyponatremia    Ischemic cardiomyopathy    a. EF 35-40% by LHC in 1/16; b. Echo 2/17: Mild LVH, EF 45-50%, inferior akinesis, grade 2 diastolic dysfunction, mild AI, mild MR, mild LAE, PASP 44 mmHg   Myocardial infarction (HCC)    Oxygen deficiency    PAF (paroxysmal atrial fibrillation) ([place])    a. Dx 11/2013. Spont conv. Placed on eliquis.   Small cell lung cancer in adult [facility]) 07/28/2018    Patient Active Problem List   Diagnosis Date Noted   Syncope 06/04/2019   Metastatic cancer to brain ([place]) 06/04/2019   COPD (chronic obstructive pulmonary disease) ([place]) 10/03/2018   Renal insufficiency 09/19/2018   Pleural effusion, right    Chronic respiratory failure ([place]) 08/13/2018   Acute respiratory failure ([place]) 08/13/2018   Leukocytosis    Extensive stage primary small cell carcinoma of lung ([place]) 08/11/2018   Encounter for antineoplastic chemotherapy 08/11/2018   Encounter for antineoplastic immunotherapy 08/11/2018   Goals of care, counseling/discussion 08/11/2018   Acute respiratory failure with hypoxia ([place]) 08/09/2018   Seizure-like activity ([place]) 08/09/2018  S/P CABG x 3 08/01/2018   Malignant neoplasm of bronchus of right upper lobe ([place]) 07/28/2018   PAF (paroxysmal atrial fibrillation) ([place]) 07/26/2018   CKD (chronic kidney disease), stage III ([place]) 07/26/2018   Abnormal CXR 07/26/2018   Normochromic normocytic anemia    Hyponatremia 10/15/2017   Nausea & vomiting 10/15/2017   Chronic diastolic CHF (congestive heart failure) ([place]) 12/23/2015   Exertional angina ([place]) 11/21/2014   Unstable angina ([place]) 12/26/2013   Atrial flutter ([place]) 11/03/2013   CAD (coronary artery disease)  10/02/2012   Type 2 diabetes mellitus with vascular disease ([place]) 09/18/2010   Essential hypertension 03/28/2010   Normocytic anemia 12/04/2008   COLONIC POLYPS, RECURRENT 04/26/2008   HYPERLIPIDEMIA 10/07/2007   ERECTILE DYSFUNCTION 10/07/2007    Past Surgical History:  Procedure Laterality Date   BIOPSY  08/01/2018   Procedure: BIOPSY OF PERICARDIAL LYMPH NODE AND HILAR LYMPH NODE;  Surgeon: Grace Isaac, MD;  Location: [facility] OR;  Service: Open Heart Surgery;;   CLIPPING OF ATRIAL APPENDAGE Left 08/01/2018   Procedure: CLIPPING OF ATRIAL APPENDAGE;  Surgeon: Grace Isaac, MD;  Location: [place];  Service: Open Heart Surgery;  Laterality: Left;   COLONOSCOPY W/ POLYPECTOMY  2002   [facility]   COLONOSCOPY W/ POLYPECTOMY  2004; 2007 negative   CORONARY ANGIOPLASTY  12/26/2013   CORONARY ARTERY BYPASS GRAFT N/A 08/01/2018   Procedure: CORONARY ARTERY BYPASS GRAFTING (CABG) TIMES THREE USING LEFT INTERNAL MAMMARY ARTERY TO LAD, RIGHT GREATER SAPHENOUS VEIN GRAFT TO OM AND RCA, VEIN HARVESTED ENDOSCOPICALLY;  Surgeon: Grace Isaac, MD;  Location: [place];  Service: Open Heart Surgery;  Laterality: N/A;   CORONARY STENT PLACEMENT  2007, 2011   infantile paralysis facial asymmetry     LEFT HEART CATH AND CORONARY ANGIOGRAPHY N/A 05/18/2017   Procedure: Left Heart Cath and Coronary Angiography;  Surgeon: Martinique, Peter M, MD;  Location: [place] CV LAB;  Service: Cardiovascular;  Laterality: N/A;   LEFT HEART CATH AND CORONARY ANGIOGRAPHY N/A 07/27/2018   Procedure: LEFT HEART CATH AND CORONARY ANGIOGRAPHY;  Surgeon: Martinique, Peter M, MD;  Location: [place] CV LAB;  Service: Cardiovascular;  Laterality: N/A;   LEFT HEART CATHETERIZATION WITH CORONARY ANGIOGRAM N/A 10/02/2012   Procedure: LEFT HEART CATHETERIZATION WITH CORONARY ANGIOGRAM;  Surgeon: Peter M Martinique, MD;  Location: [facility] CATH LAB;  Service: Cardiovascular;  Laterality: N/A;   LEFT HEART CATHETERIZATION  WITH CORONARY ANGIOGRAM N/A 02/20/2013   Procedure: LEFT HEART CATHETERIZATION WITH CORONARY ANGIOGRAM;  Surgeon: Burnell Blanks, MD;  Location: [facility] CATH LAB;  Service: Cardiovascular;  Laterality: N/A;   LEFT HEART CATHETERIZATION WITH CORONARY ANGIOGRAM N/A 12/26/2013   Procedure: LEFT HEART CATHETERIZATION WITH CORONARY ANGIOGRAM;  Surgeon: Burnell Blanks, MD;  Location: [facility] CATH LAB;  Service: Cardiovascular;  Laterality: N/A;   LEFT HEART CATHETERIZATION WITH CORONARY ANGIOGRAM N/A 11/28/2014   Procedure: LEFT HEART CATHETERIZATION WITH CORONARY ANGIOGRAM;  Surgeon: Burnell Blanks, MD;  Location: [facility] CATH LAB;  Service: Cardiovascular;  Laterality: N/A;   PERCUTANEOUS CORONARY STENT INTERVENTION (PCI-S)  10/02/2012   Procedure: PERCUTANEOUS CORONARY STENT INTERVENTION (PCI-S);  Surgeon: Peter M Martinique, MD;  Location: [facility] CATH LAB;  Service: Cardiovascular;;   PERCUTANEOUS CORONARY STENT INTERVENTION (PCI-S)  02/20/2013   Procedure: PERCUTANEOUS CORONARY STENT INTERVENTION (PCI-S);  Surgeon: Burnell Blanks, MD;  Location: [facility] CATH LAB;  Service: Cardiovascular;;   PERCUTANEOUS CORONARY STENT INTERVENTION (PCI-S)  12/26/2013   Procedure: PERCUTANEOUS CORONARY STENT INTERVENTION (PCI-S);  Surgeon:  Burnell Blanks, MD;  Location: [facility] CATH LAB;  Service: Cardiovascular;;   TEE WITHOUT CARDIOVERSION N/A 08/01/2018   Procedure: TRANSESOPHAGEAL ECHOCARDIOGRAM (TEE);  Surgeon: Grace Isaac, MD;  Location: [place];  Service: Open Heart Surgery;  Laterality: N/A;   WISDOM TOOTH EXTRACTION          Home Medications    Prior to Admission medications   Medication Sig Start Date End Date Taking? Authorizing Provider  acetaminophen (TYLENOL) 325 MG tablet Take 2 tablets (650 mg total) by mouth every 6 (six) hours as needed for mild pain. 08/06/18   Nani Skillern, PA-C  albuterol (PROVENTIL HFA;VENTOLIN HFA) 108 (90 Base) MCG/ACT inhaler Inhale 2 puffs into  the lungs every 6 (six) hours as needed for wheezing or shortness of breath. 08/15/18   Rai, Ripudeep Raliegh Ip, MD  amiodarone (PACERONE) 200 MG tablet TAKE 1 TABLET (200 MG TOTAL) BY MOUTH DAILY. 02/23/19   Burnell Blanks, MD  apixaban (ELIQUIS) 5 MG TABS tablet Take 1 tablet (5 mg total) by mouth 2 (two) times daily. 01/18/19   Burnell Blanks, MD  aspirin EC 81 MG EC tablet Take 1 tablet (81 mg total) by mouth daily. 08/06/18   Lars Pinks M, PA-C  atorvastatin (LIPITOR) 20 MG tablet TAKE 1 TABLET EVERY DAY 10/31/18   Burnell Blanks, MD  cholecalciferol (VITAMIN D) 1000 UNITS tablet Take 1,000 Units by mouth every evening.     [provider]  clopidogrel (PLAVIX) 75 MG tablet TAKE 1 TABLET EVERY DAY WITH BREAKFAST 02/23/19   Burnell Blanks, MD  doxycycline (VIBRA-TABS) 100 MG tablet Take 1 tablet (100 mg total) by mouth 2 (two) times daily. 04/17/19   Curt Bears, MD  fexofenadine (ALLEGRA) 180 MG tablet Take 180 mg by mouth daily as needed for allergies.     [provider]  fluticasone (FLONASE) 50 MCG/ACT nasal spray Place 1 spray into both nostrils daily as needed for allergies.     [provider]  furosemide (LASIX) 20 MG tablet TAKE 1 TABLET (20 MG TOTAL) BY MOUTH DAILY. 02/23/19   Burnell Blanks, MD  guaiFENesin (MUCINEX) 600 MG 12 hr tablet Take 1 tablet (600 mg total) by mouth 2 (two) times daily as needed for cough or to loosen phlegm. 08/06/18   Nani Skillern, PA-C  insulin NPH-regular Human (70-30) 100 UNIT/ML injection Inject 15 Units into the skin daily with supper.    [provider]  Iron Combinations (IRON COMPLEX PO) Take 1 tablet by mouth daily.    [provider]  isosorbide mononitrate (IMDUR) 60 MG 24 hr tablet TAKE 1 TABLET EVERY DAY 10/31/18   Burnell Blanks, MD  metFORMIN (GLUCOPHAGE) 1000 MG tablet Take 1,000 mg by mouth daily with breakfast.    [provider]  metoprolol tartrate (LOPRESSOR) 25 MG tablet TAKE 1 TABLET TWICE DAILY 12/21/18   Burnell Blanks, MD  Multiple Vitamin (MULTIVITAMIN) tablet Take 1 tablet by mouth at bedtime.     [provider]  nitroGLYCERIN (NITROSTAT) 0.4 MG SL tablet Place 1 tablet (0.4 mg total) under the tongue every 5 (five) minutes as needed for chest pain. 05/12/19 08/10/19  Burnell Blanks, MD  ondansetron (ZOFRAN ODT) 4 MG disintegrating tablet Take 1 tablet (4 mg total) by mouth every 8 (eight) hours as needed for nausea or vomiting. Patient not taking: Reported on 11/21/2018 08/15/18   Rai, Vernelle Emerald, MD  predniSONE (DELTASONE) 20 MG tablet  Take 4 tabs po qd x 7 days, followed by 3 tab po qd x 7 days, followed by 2 tab po qd x 7 days, then one tab po qd x 7 days, then 1/2 tab po qd x one week 04/17/19   Curt Bears, MD  prochlorperazine (COMPAZINE) 10 MG tablet Take 1 tablet (10 mg total) by mouth every 6 (six) hours as needed for nausea or vomiting. Patient not taking: Reported on 11/21/2018 08/29/18   Curt Bears, MD  vitamin E 100 UNIT capsule Take 100 Units by mouth at bedtime.     [provider]    Family History Family History  Problem Relation Age of Onset   Heart attack Brother 64   Colon cancer Brother    Prostate cancer Brother    Breast cancer Sister    Stroke Sister    Diabetes Neg Hx    Hypertension Neg Hx     Social History Social History   Tobacco Use   Smoking status: Former Smoker    Packs/day: 2.00    Years: 30.00    Pack years: 60.00    Types: Cigarettes    Quit date: 11/02/1986    Years since quitting: 32.6   Smokeless tobacco: Never Used   Tobacco comment: smoked [place], up to 3 ppd (!)  Substance Use Topics   Alcohol use: No   Drug use: No     Allergies   Brilinta [ticagrelor] and Crestor [rosuvastatin]   Review of Systems Review of Systems  Constitutional: Positive for malaise/fatigue. Negative for  diaphoresis and fever.  Respiratory: Negative for shortness of breath.   Cardiovascular: Positive for syncope. Negative for chest pain and palpitations.  Gastrointestinal: Negative for nausea and vomiting.  Allergic/Immunologic: Positive for immunocompromised state.  Neurological: Positive for syncope. Negative for dizziness, focal weakness, seizures and headaches.  Hematological: Bruises/bleeds easily.  Psychiatric/Behavioral: Negative for confusion.  All other systems reviewed and are negative.    Physical Exam Updated Vital Signs BP (!) 108/51 (BP Location: Left Arm)    Pulse 62    Temp 98.7 F (37.1 C) (Oral)    Resp 16    SpO2 98%   Physical Exam Vitals signs and nursing note reviewed.  Constitutional:      Appearance: He is well-developed.  HENT:     Head: Normocephalic and atraumatic.     Mouth/Throat:     Mouth: Mucous membranes are moist.  Eyes:     Conjunctiva/sclera: Conjunctivae normal.  Neck:     Musculoskeletal: Neck supple. No muscular tenderness.  Cardiovascular:     Rate and Rhythm: Regular rhythm. Bradycardia present.     Heart sounds: No murmur.  Pulmonary:     Effort: Pulmonary effort is normal. No respiratory distress.     Breath sounds: Normal breath sounds.  Abdominal:     Palpations: Abdomen is soft.     Tenderness: There is no abdominal tenderness.  Skin:    General: Skin is warm and dry.     Comments: Small skin tear to dorsum of left hand  Neurological:     Mental Status: He is alert.      ED Treatments / Results  Labs (all labs ordered are listed, but only abnormal results are displayed) Labs Reviewed  BASIC METABOLIC PANEL - Abnormal; Notable for the following components:      Result Value   Sodium 132 (*)    Chloride 95 (*)    Glucose, Bld 428 (*)  Creatinine, Ser 1.70 (*)    GFR calc non Af Amer 38 (*)    GFR calc Af Amer 44 (*)    All other components within normal limits  CBC - Abnormal; Notable for the following  components:   RBC 4.16 (*)    Hemoglobin 11.9 (*)    HCT 36.9 (*)    All other components within normal limits  URINALYSIS, ROUTINE W REFLEX MICROSCOPIC - Abnormal; Notable for the following components:   Color, Urine STRAW (*)    Glucose, UA >=500 (*)    Hgb urine dipstick SMALL (*)    Leukocytes,Ua SMALL (*)    All other components within normal limits  CBG MONITORING, ED - Abnormal; Notable for the following components:   Glucose-Capillary 388 (*)    All other components within normal limits  TROPONIN I (HIGH SENSITIVITY) - Abnormal; Notable for the following components:   Troponin I (High Sensitivity) 50 (*)    All other components within normal limits  TROPONIN I (HIGH SENSITIVITY) - Abnormal; Notable for the following components:   Troponin I (High Sensitivity) 54 (*)    All other components within normal limits  SARS CORONAVIRUS 2  CORTISOL  HEMOGLOBIN A1C  COMPREHENSIVE METABOLIC PANEL  CBC WITH DIFFERENTIAL/PLATELET    EKG EKG Interpretation  Date/Time:  Sunday June 04 2019 16:27:45 EDT Ventricular Rate:  59 PR Interval:    QRS Duration: 108 QT Interval:  585 QTC Calculation: 580 R Axis:   64 Text Interpretation:  Sinus rhythm Short PR interval Low voltage, extremity leads Abnrm T, consider ischemia, anterolateral lds Prolonged QT interval no significant change since Oct 2019 Confirmed by Sherwood Gambler [phone]) on 06/04/2019 5:51:24 PM   Radiology Ct Head Wo Contrast  Result Date: 06/04/2019 CLINICAL DATA:  Fall, history of small cell lung cancer EXAM: CT HEAD WITHOUT CONTRAST TECHNIQUE: Contiguous axial images were obtained from the base of the skull through the vertex without intravenous contrast. COMPARISON:  Brain MRI 08/10/2018 FINDINGS: Brain: New large cavitary lesion in the high RIGHT frontal lobe with thickened wall measures 3.9 x 3.0 cm. There is extensive vasogenic edema associated with this new large lesion. Similar lesion in the LEFT frontal lobe  measuring 1.9 x 1.9 cm also with vasogenic edema. Smaller midline LEFT frontal lesion measuring 1.7 cm along the anterior interhemispheric fissure. Additional lesion in the LEFT cerebellum measuring 2.0 cm Lesion in the head of the RIGHT caudate nucleus is high-density measuring 1.7 cm. Basal cisterns are patent.  No hydrocephalus. No acute intracranial hemorrhage.  No intraventricular hemorrhage Vascular: No hyperdense vessel or unexpected calcification. Skull: Normal. Negative for fracture or focal lesion. Sinuses/Orbits: No acute finding. Other: IMPRESSION: 1. Unfortunately, there are new parenchymal cavitary masses with extensive vasogenic edema consistent with multifocal intracranial metastasis with central necrosis. 2. Metastatic lesions include the high RIGHT frontal lobe, anterior LEFT frontal lobe, head of the RIGHT caudate nucleus, and LEFT cerebellum. 3. No hydrocephalus. Basal cisterns patent. No intracranial hemorrhage , Findings [place] 06/04/2019  at18:16. Electronically Signed   By: Suzy Bouchard M.D.   On: 06/04/2019 18:17   Dg Chest Port 1 View  Result Date: 06/04/2019 CLINICAL DATA:  Syncope.  History of lung carcinoma EXAM: PORTABLE CHEST 1 VIEW COMPARISON:  Chest CT May 25, 2019; chest radiograph September 16, 2019 FINDINGS: There are areas of ill-defined opacity in the right base, left base, and left mid lung. The apparent consolidation seen in the left mid lung on recent CT is  not appreciable by radiography. There is underlying emphysematous change, better delineated on recent CT. Heart size and pulmonary vascularity are normal. No adenopathy is identified by radiography. Patient is status post coronary artery bypass grafting. There is a left atrial appendage clamp present. There is aortic atherosclerosis. No bone lesions. IMPRESSION: Areas of ill-defined opacity in the lung bases and left mid lung regions. No consolidation or masses evident by radiography. Cardiac silhouette  within normal limits. No adenopathy appreciable. Postoperative changes noted. Aortic Atherosclerosis (ICD10-I70.0) and Emphysema (ICD10-J43.9). Electronically Signed   By: Lowella Grip III M.D.   On: 06/04/2019 17:29    Procedures Procedures (including critical care time)  Medications Ordered in ED Medications  dexamethasone (DECADRON) tablet 8 mg (8 mg Oral Given 06/04/19 1949)  aspirin EC tablet 81 mg (has no administration in time range)  amiodarone (PACERONE) tablet 200 mg (has no administration in time range)  atorvastatin (LIPITOR) tablet 20 mg (has no administration in time range)  metoprolol tartrate (LOPRESSOR) tablet 25 mg (has no administration in time range)  nitroGLYCERIN (NITROSTAT) SL tablet 0.4 mg (has no administration in time range)  apixaban (ELIQUIS) tablet 5 mg (has no administration in time range)  clopidogrel (PLAVIX) tablet 75 mg (has no administration in time range)  cholecalciferol (VITAMIN D) tablet 1,000 Units (has no administration in time range)  multivitamin tablet 1 tablet (has no administration in time range)  vitamin E capsule 100 Units (has no administration in time range)  acetaminophen (TYLENOL) tablet 650 mg (has no administration in time range)    Or  acetaminophen (TYLENOL) suppository 650 mg (has no administration in time range)  ondansetron (ZOFRAN) tablet 4 mg (has no administration in time range)    Or  ondansetron (ZOFRAN) injection 4 mg (has no administration in time range)  insulin glargine (LANTUS) injection 20 Units (has no administration in time range)  insulin aspart (novoLOG) injection 0-9 Units (has no administration in time range)  0.9 %  sodium chloride infusion (has no administration in time range)  albuterol (PROVENTIL) (2.5 MG/3ML) 0.083% nebulizer solution 2.5 mg (has no administration in time range)     Initial Impression / Assessment and Plan / ED Course  I have reviewed the triage vital signs and the nursing  notes.  Pertinent labs & imaging results that were available during my care of the patient were reviewed by me and considered in my medical decision making (see chart for details).        Mr. Bonnin is a 78 year old male with history of metastatic small cell lung cancer on palliative chemotherapy presents today with syncope.  Multiple potential causes but does sound at least in part to be vagal related as both episodes have followed straining to urinate.  ECG shows long QT.  Repeat syncope in 2 days with long QT in a patient of his age is high risk.  Work-up is notable for mild hyponatremia 132, hyperglycemia at 428.  Elevated creatinine at baseline and consistent with CKD.  Troponin detectable at 50 but stabilized repeat was 54.  Stable normocytic anemia with hemoglobin 11.9.  No leukocytosis.  CT head was ordered due to fall and known anticoagulated patient of elderly age.  Unfortunately this was concerning for extensive metastatic burden of disease.  Reviewed with oncology service who recommended admission and initiation of Decadron which was ordered.  Patient was subsequently admitted to the hospitalist service and oncology will continue to follow.   Final Clinical Impressions(s) / ED Diagnoses  Final diagnoses:  Syncope and collapse  Primary malignant neoplasm of lung metastatic to other site, unspecified laterality [facility])  Hyperglycemia    ED Discharge Orders    None       Tillie Fantasia, MD 06/04/19 [id number]    Tillie Fantasia, MD 06/04/19 [id number]    Sherwood Gambler, MD 06/04/19 [phone]

## 2019-06-04 NOTE — ED Notes (Signed)
ED TO INPATIENT HANDOFF REPORT  ED Nurse Name and Phone #: Mauri [place] Name/Age/Gender Gregory Bates 78 y.o. male Room/Bed: 022C/022C  Code Status   Code Status: Prior  Home/SNF/Other Dc home AO x 4   Triage Complete: Triage complete  Chief Complaint Fall, Syncope  Triage Note Pt fell in BR today at home. Wife reported Pt had Chemo a few days ago and has felt weak since then. Pt has skin tears on hand after hall. EMS reported CBG to be 501 and Pt's PCP knows the CBG's have been high. Pt takes steroids also that contribute to high CBG.   Allergies Allergies  Allergen Reactions  . Brilinta [Ticagrelor] Other (See Comments)    Arthralgias & myalgias  . Crestor [Rosuvastatin] Other (See Comments)    Myalgias    Level of Care/Admitting Diagnosis ED Disposition    ED Disposition Condition Comment   Admit  Hospital Area: [place] [[id number]]  Level of Care: Telemetry [5]  Admit to tele based on following criteria: Eval of Syncope  Covid Evaluation: Asymptomatic Screening Protocol (No Symptoms)  Diagnosis: Syncope [716967]  Admitting Physician: Rise Patience [phone]  Attending Physician: Rise Patience [email or web address]  PT Class (Do Not Modify): Observation [104]  PT Acc Code (Do Not Modify): Observation [10022]       B Medical/Surgery History Past Medical History:  Diagnosis Date  . Acute renal insufficiency    a. During 11/2013 adm with atrial flutter.  . Anemia, unspecified   . Atrial fibrillation ([place])   . CAD (coronary artery disease), native coronary artery    a. s/p cath May 2011 >> DES distal RCA;  b. 01/2013 NSTEMI >> OM1 99p (2.75x12 Promus Premier DES);  c. LHC (2/15): PCI: Promus DES to dist RCA ;  d. LHC 1/16 - dLM 60, oLAD 30, small D1 tandem 65, pOM1 stent ok, mOM 50-60, dOM bifurcation 50-60 in both vessels, AV 25-70 jailed by stent, pRCA 91 mRCA stent ok, dRCA stent ok, PLB smal 50, dPDA 60, 80; EF 35-40 >> med Rx - PCI of PDA if  angina  . Chronic combined systolic and diastolic CHF (congestive heart failure) ([place]) 12/23/2015   Echo 2/17: Mild LVH, EF 45-50%, inferior akinesis, grade 2 diastolic dysfunction, mild AI, mild MR, mild LAE, PASP 44 mmHg   . Colorectal polyps 2002  . Diabetes mellitus    AODM  . Erectile dysfunction   . History of echocardiogram    a. 01/2013 Echo: EF 55%, mid-dist inflat HK, Gr1 DD, Triv AI/TR, Mild MR.;  b.  Echo (1/15): Mild LVH, EF 55%, inferolateral hypokinesis, grade 1 diastolic dysfunction, mild AI, trivial MR, moderate LAE, PASP 36 mmHg   . Hx of cardiovascular stress test    Stress myoview 08/07/13 with LVEF 44%, inferior scar, no ischemia  . Hyperlipidemia   . Hypertension   . Hyponatremia   . Ischemic cardiomyopathy    a. EF 35-40% by LHC in 1/16; b. Echo 2/17: Mild LVH, EF 45-50%, inferior akinesis, grade 2 diastolic dysfunction, mild AI, mild MR, mild LAE, PASP 44 mmHg  . Myocardial infarction ([place])   . Oxygen deficiency   . PAF (paroxysmal atrial fibrillation) ([place])    a. Dx 11/2013. Spont conv. Placed on eliquis.  . Small cell lung cancer in adult [facility]) 07/28/2018   Past Surgical History:  Procedure Laterality Date  . BIOPSY  08/01/2018   Procedure: BIOPSY OF PERICARDIAL LYMPH NODE AND HILAR LYMPH NODE;  Surgeon: Grace Isaac, MD;  Location: [facility] OR;  Service: Open Heart Surgery;;  . CLIPPING OF ATRIAL APPENDAGE Left 08/01/2018   Procedure: CLIPPING OF ATRIAL APPENDAGE;  Surgeon: Grace Isaac, MD;  Location: [facility];  Service: Open Heart Surgery;  Laterality: Left;  . COLONOSCOPY W/ POLYPECTOMY  2002   [facility]  . COLONOSCOPY W/ POLYPECTOMY  2004; 2007 negative  . CORONARY ANGIOPLASTY  12/26/2013  . CORONARY ARTERY BYPASS GRAFT N/A 08/01/2018   Procedure: CORONARY ARTERY BYPASS GRAFTING (CABG) TIMES THREE USING LEFT INTERNAL MAMMARY ARTERY TO LAD, RIGHT GREATER SAPHENOUS VEIN GRAFT TO OM AND RCA, VEIN HARVESTED ENDOSCOPICALLY;  Surgeon: Grace Isaac, MD;   Location: [place];  Service: Open Heart Surgery;  Laterality: N/A;  . CORONARY STENT PLACEMENT  2007, 2011  . infantile paralysis facial asymmetry    . LEFT HEART CATH AND CORONARY ANGIOGRAPHY N/A 05/18/2017   Procedure: Left Heart Cath and Coronary Angiography;  Surgeon: Martinique, Peter M, MD;  Location: [place] CV LAB;  Service: Cardiovascular;  Laterality: N/A;  . LEFT HEART CATH AND CORONARY ANGIOGRAPHY N/A 07/27/2018   Procedure: LEFT HEART CATH AND CORONARY ANGIOGRAPHY;  Surgeon: Martinique, Peter M, MD;  Location: [place] [facility] LAB;  Service: Cardiovascular;  Laterality: N/A;  . LEFT HEART CATHETERIZATION WITH CORONARY ANGIOGRAM N/A 10/02/2012   Procedure: LEFT HEART CATHETERIZATION WITH CORONARY ANGIOGRAM;  Surgeon: Peter M Martinique, MD;  Location: [facility] CATH LAB;  Service: Cardiovascular;  Laterality: N/A;  . LEFT HEART CATHETERIZATION WITH CORONARY ANGIOGRAM N/A 02/20/2013   Procedure: LEFT HEART CATHETERIZATION WITH CORONARY ANGIOGRAM;  Surgeon: Burnell Blanks, MD;  Location: [facility] CATH LAB;  Service: Cardiovascular;  Laterality: N/A;  . LEFT HEART CATHETERIZATION WITH CORONARY ANGIOGRAM N/A 12/26/2013   Procedure: LEFT HEART CATHETERIZATION WITH CORONARY ANGIOGRAM;  Surgeon: Burnell Blanks, MD;  Location: [facility] CATH LAB;  Service: Cardiovascular;  Laterality: N/A;  . LEFT HEART CATHETERIZATION WITH CORONARY ANGIOGRAM N/A 11/28/2014   Procedure: LEFT HEART CATHETERIZATION WITH CORONARY ANGIOGRAM;  Surgeon: Burnell Blanks, MD;  Location: [facility] C[facility] LAB;  Service: Cardiovascular;  Laterality: N/A;  . PERCUTANEOUS CORONARY STENT INTERVENTION (PCI-S)  10/02/2012   Procedure: PERCUTANEOUS CORONARY STENT INTERVENTION (PCI-S);  Surgeon: Peter M Martinique, MD;  Location: [facility] CATH LAB;  Service: Cardiovascular;;  . PERCUTANEOUS CORONARY STENT INTERVENTION (PCI-S)  02/20/2013   Procedure: PERCUTANEOUS CORONARY STENT INTERVENTION (PCI-S);  Surgeon: Burnell Blanks, MD;  Location: [facility] CATH LAB;   Service: Cardiovascular;;  . PERCUTANEOUS CORONARY STENT INTERVENTION (PCI-S)  12/26/2013   Procedure: PERCUTANEOUS CORONARY STENT INTERVENTION (PCI-S);  Surgeon: Burnell Blanks, MD;  Location: [facility] CATH LAB;  Service: Cardiovascular;;  . TEE WITHOUT CARDIOVERSION N/A 08/01/2018   Procedure: TRANSESOPHAGEAL ECHOCARDIOGRAM (TEE);  Surgeon: Grace Isaac, MD;  Location: [place];  Service: Open Heart Surgery;  Laterality: N/A;  . WISDOM TOOTH EXTRACTION       A IV Location/Drains/Wounds Patient Lines/Drains/Airways Status   Active Line/Drains/Airways    Name:   Placement date:   Placement time:   Site:   Days:   Peripheral IV 06/04/19 Right Antecubital   06/04/19    1732    Antecubital   less than 1   Incision (Closed) 07/27/18 Groin Right   07/27/18    1831     312   Incision (Closed) 08/01/18 Chest Other (Comment)   08/01/18    1238     307   Incision (Closed) 08/01/18 Leg Right   08/01/18  1238     307          Intake/Output Last 24 hours No intake or output data in the 24 hours ending 06/04/19 2028  Labs/Imaging Results for orders placed or performed during the hospital encounter of 06/04/19 (from the past 48 hour(s))  Urinalysis, Routine w reflex microscopic     Status: Abnormal   Collection Time: 06/04/19  5:11 PM  Result Value Ref Range   Color, Urine STRAW (A) YELLOW   APPearance CLEAR CLEAR   Specific Gravity, Urine 1.016 1.005 - 1.030   pH 5.0 5.0 - 8.0   Glucose, UA >=500 (A) NEGATIVE mg/dL   Hgb urine dipstick SMALL (A) NEGATIVE   Bilirubin Urine NEGATIVE NEGATIVE   Ketones, ur NEGATIVE NEGATIVE mg/dL   Protein, ur NEGATIVE NEGATIVE mg/dL   Nitrite NEGATIVE NEGATIVE   Leukocytes,Ua SMALL (A) NEGATIVE   RBC / HPF 0-5 0 - 5 RBC/hpf   WBC, UA 0-5 0 - 5 WBC/hpf   Bacteria, UA NONE SEEN NONE SEEN   Squamous Epithelial / LPF 0-5 0 - 5   Mucus PRESENT    Hyaline Casts, UA PRESENT     Comment: Performed at [facility], [place] [address], [place],  [place] [address]  Basic metabolic panel     Status: Abnormal   Collection Time: 06/04/19  5:25 PM  Result Value Ref Range   Sodium 132 (L) 135 - 145 mmol/L   Potassium 4.7 3.5 - 5.1 mmol/L   Chloride 95 (L) 98 - 111 mmol/L   CO2 25 22 - 32 mmol/L   Glucose, Bld 428 (H) 70 - 99 mg/dL   BUN 21 8 - 23 mg/dL   Creatinine, Ser 1.70 (H) 0.61 - 1.24 mg/dL   Calcium 9.5 8.9 - 10.3 mg/dL   GFR calc non Af Amer 38 (L) >60 mL/min   GFR calc Af Amer 44 (L) >60 mL/min   Anion gap 12 5 - 15    Comment: Performed at [facility] [facility] [address] [place], [place] [address]  CBC     Status: Abnormal   Collection Time: 06/04/19  5:25 PM  Result Value Ref Range   WBC 9.8 4.0 - 10.5 K/uL   RBC 4.16 (L) 4.22 - 5.81 MIL/uL   Hemoglobin 11.9 (L) 13.0 - 17.0 g/dL   HCT 36.9 (L) 39.0 - 52.0 %   MCV 88.7 80.0 - 100.0 fL   MCH 28.6 26.0 - 34.0 pg   MCHC 32.2 30.0 - 36.0 g/dL   RDW 14.7 11.5 - 15.5 %   Platelets 217 150 - 400 K/uL   nRBC 0.0 0.0 - 0.2 %    Comment: Performed at [facility], [place] [address], [place], [place] [address]  Troponin I (High Sensitivity)     Status: Abnormal   Collection Time: 06/04/19  5:25 PM  Result Value Ref Range   Troponin I (High Sensitivity) 50 (H) <18 ng/L    Comment: (NOTE) Elevated high sensitivity troponin I (hsTnI) values and significant  changes across serial measurements may suggest ACS but many other  chronic and acute conditions are known to elevate hsTnI results.  Refer to the "Links" section for chest pain algorithms and additional  guidance. Performed at [facility], [place] [address], [place], [place] [address]   Cortisol     Status: None   Collection Time: 06/04/19  5:25 PM  Result Value Ref Range   Cortisol, Plasma  19.0 ug/dL    Comment: (NOTE) AM    6.7 - 22.6 ug/dL PM   <10.0       ug/dL Performed at [facility], [place] [address], [place], [place] [address]   CBG monitoring, ED     Status: Abnormal   Collection Time: 06/04/19   5:31 PM  Result Value Ref Range   Glucose-Capillary 388 (H) 70 - 99 mg/dL   Ct Head Wo Contrast  Result Date: 06/04/2019 CLINICAL DATA:  Fall, history of small cell lung cancer EXAM: CT HEAD WITHOUT CONTRAST TECHNIQUE: Contiguous axial images were obtained from the base of the skull through the vertex without intravenous contrast. COMPARISON:  Brain MRI 08/10/2018 FINDINGS: Brain: New large cavitary lesion in the high RIGHT frontal lobe with thickened wall measures 3.9 x 3.0 cm. There is extensive vasogenic edema associated with this new large lesion. Similar lesion in the LEFT frontal lobe measuring 1.9 x 1.9 cm also with vasogenic edema. Smaller midline LEFT frontal lesion measuring 1.7 cm along the anterior interhemispheric fissure. Additional lesion in the LEFT cerebellum measuring 2.0 cm Lesion in the head of the RIGHT caudate nucleus is high-density measuring 1.7 cm. Basal cisterns are patent.  No hydrocephalus. No acute intracranial hemorrhage.  No intraventricular hemorrhage Vascular: No hyperdense vessel or unexpected calcification. Skull: Normal. Negative for fracture or focal lesion. Sinuses/Orbits: No acute finding. Other: IMPRESSION: 1. Unfortunately, there are new parenchymal cavitary masses with extensive vasogenic edema consistent with multifocal intracranial metastasis with central necrosis. 2. Metastatic lesions include the high RIGHT frontal lobe, anterior LEFT frontal lobe, head of the RIGHT caudate nucleus, and LEFT cerebellum. 3. No hydrocephalus. Basal cisterns patent. No intracranial hemorrhage , Findings conveyed to[place] 06/04/2019  at18:16. Electronically Signed   By: Suzy Bouchard M.D.   On: 06/04/2019 18:17   Dg Chest Port 1 View  Result Date: 06/04/2019 CLINICAL DATA:  Syncope.  History of lung carcinoma EXAM: PORTABLE CHEST 1 VIEW COMPARISON:  Chest CT May 25, 2019; chest radiograph September 16, 2019 FINDINGS: There are areas of ill-defined opacity in the right base, left  base, and left mid lung. The apparent consolidation seen in the left mid lung on recent CT is not appreciable by radiography. There is underlying emphysematous change, better delineated on recent CT. Heart size and pulmonary vascularity are normal. No adenopathy is identified by radiography. Patient is status post coronary artery bypass grafting. There is a left atrial appendage clamp present. There is aortic atherosclerosis. No bone lesions. IMPRESSION: Areas of ill-defined opacity in the lung bases and left mid lung regions. No consolidation or masses evident by radiography. Cardiac silhouette within normal limits. No adenopathy appreciable. Postoperative changes noted. Aortic Atherosclerosis (ICD10-I70.0) and Emphysema (ICD10-J43.9). Electronically Signed   By: Lowella Grip III M.D.   On: 06/04/2019 17:29    Pending Labs Unresulted Labs (From admission, onward)    Start     Ordered   06/04/19 1906  SARS CORONAVIRUS 2 Nasal Swab Aptima Multi Swab  (Asymptomatic Patients Labs)  Once,   STAT    Question Answer Comment  Is this test for diagnosis or screening Screening   Symptomatic for COVID-19 as defined by CDC No   Hospitalized for COVID-19 No   Admitted to ICU for COVID-19 No   Previously tested for COVID-19 No   Resident in a congregate (group) care setting No   Employed in healthcare setting No      06/04/19 1906  Vitals/Pain Today's Vitals   06/04/19 1730 06/04/19 1734 06/04/19 1912 06/04/19 1952  BP: (!) 117/49  (!) 112/53 (!) 113/50  Pulse:   64 62  Resp: 14  15 19   Temp:      TempSrc:      SpO2:   99% 97%  PainSc:  0-No pain  0-No pain    Isolation Precautions No active isolations  Medications Medications  dexamethasone (DECADRON) tablet 8 mg (8 mg Oral Given 06/04/19 1949)    Mobility One assistance Low fall risk   Focused Assessments Pt AO x 4, no skin problem, SR on Monitor. Lung CA with metastatic CA of brain.   R Recommendations: See  Admitting Provider Note  Report given to:   Additional Notes:

## 2019-06-04 NOTE — ED Notes (Signed)
Patient wife Gregory Bates  [phone] would like a call back.  She states she heard we are moving patient to another hospital

## 2019-06-04 NOTE — ED Notes (Signed)
Pt ambulated inside the room with a walker. Steady gait and stable on feet.

## 2019-06-04 NOTE — H&P (Signed)
History and Physical    Gregory Bates YNW:[id number] DOB: [birth year] DOA: 06/04/2019  PCP: Emmaline Kluver, MD  Patient coming from: Home.  History obtained from patient's grandson.  Chief Complaint: Loss of consciousness.  HPI: Gregory Bates is a 78 y.o. male with history of extensive stage small cell lung cancer on palliative chemotherapy with history of CAD status post CABG last year, chronic diastolic CHF last EF measured in October 2019 was 77 to 50% with grade 2 diastolic dysfunction, chronic and disease stage III, diabetes mellitus type 2, anemia, paroxysmal atrial fibrillation was brought to the ER after patient had a syncopal episode.  As per patient's grandson this is a second syncopal episode in the last 24 hours.  Patient's last chemotherapy was last week.  Over the last 1 week patient has had significant decline in his function and finds it very difficult to ambulate because of weakness.  About a week ago patient was found weak in his hall at home and had incontinence of urine at that time.  24 hours ago patient was trying to walk to the bathroom when he passed out same thing happened today.  Exact number of minutes patient pattern is not clear but did have incontinence of urine after the pass out and appeared confused.  Per patient's grandson patient has been having some chest pain off and on for last few weeks.  Patient did not have any nausea vomiting abdominal pain diarrhea though was treated for colitis in June with antibiotics.  ED Course: In the ER patient blood pressure initially was in the low normal which improved with fluids.  High-sensitivity troponin was 1554 labs show hemoglobin of 11.9 platelets 217 creatinine 1.7 sodium 132 EKG shows T wave inversions in leads with prolonged QTC.  Blood sugar was 428 with anion gap of 12.  Chest x-ray shows densities.  Patient has no history of lung cancer.  CT head was done which shows metastatic lesions with edema.  On-call oncologist  Dr. Burr Medico was consulted by the ER physician and at this time oncology advised patient to be transferred to [facility] long hospital.  Decadron 8 mg IV every 8 was started.  On my exam patient is eating on his bed and denies any chest pain or shortness of breath is able to move all extremities denies any pain.  Review of Systems: As per HPI, rest all negative.   Past Medical History:  Diagnosis Date   Acute renal insufficiency    a. During 11/2013 adm with atrial flutter.   Anemia, unspecified    Atrial fibrillation (HCC)    CAD (coronary artery disease), native coronary artery    a. s/p cath May 2011 >> DES distal RCA;  b. 01/2013 NSTEMI >> OM1 99p (2.75x12 Promus Premier DES);  c. LHC (2/15): PCI: Promus DES to dist RCA ;  d. LHC 1/16 - dLM 81, oLAD 30, small D1 tandem 36, pOM1 stent ok, mOM 35-60, dOM bifurcation 50-60 in both vessels, AV 36-70 jailed by stent, pRCA 16 mRCA stent ok, dRCA stent ok, PLB smal 50, dPDA 60, 80; EF 35-40 >> med Rx - PCI of PDA if angina   Chronic combined systolic and diastolic CHF (congestive heart failure) ([place]) 12/23/2015   Echo 2/17: Mild LVH, EF 45-50%, inferior akinesis, grade 2 diastolic dysfunction, mild AI, mild MR, mild LAE, PASP 44 mmHg    Colorectal polyps 2002   Diabetes mellitus    AODM   Erectile dysfunction  History of echocardiogram    a. 01/2013 Echo: EF 55%, mid-dist inflat HK, Gr1 DD, Triv AI/TR, Mild MR.;  b.  Echo (1/15): Mild LVH, EF 55%, inferolateral hypokinesis, grade 1 diastolic dysfunction, mild AI, trivial MR, moderate LAE, PASP 36 mmHg    Hx of cardiovascular stress test    Stress myoview 08/07/13 with LVEF 44%, inferior scar, no ischemia   Hyperlipidemia    Hypertension    Hyponatremia    Ischemic cardiomyopathy    a. EF 35-40% by LHC in 1/16; b. Echo 2/17: Mild LVH, EF 45-50%, inferior akinesis, grade 2 diastolic dysfunction, mild AI, mild MR, mild LAE, PASP 44 mmHg   Myocardial infarction (HCC)    Oxygen deficiency     PAF (paroxysmal atrial fibrillation) ([place])    a. Dx 11/2013. Spont conv. Placed on eliquis.   Small cell lung cancer in adult [facility]) 07/28/2018    Past Surgical History:  Procedure Laterality Date   BIOPSY  08/01/2018   Procedure: BIOPSY OF PERICARDIAL LYMPH NODE AND HILAR LYMPH NODE;  Surgeon: Grace Isaac, MD;  Location: [place];  Service: Open Heart Surgery;;   CLIPPING OF ATRIAL APPENDAGE Left 08/01/2018   Procedure: CLIPPING OF ATRIAL APPENDAGE;  Surgeon: Grace Isaac, MD;  Location: [place];  Service: Open Heart Surgery;  Laterality: Left;   COLONOSCOPY W/ POLYPECTOMY  2002   [facility]   COLONOSCOPY W/ POLYPECTOMY  2004; 2007 negative   CORONARY ANGIOPLASTY  12/26/2013   CORONARY ARTERY BYPASS GRAFT N/A 08/01/2018   Procedure: CORONARY ARTERY BYPASS GRAFTING (CABG) TIMES THREE USING LEFT INTERNAL MAMMARY ARTERY TO LAD, RIGHT GREATER SAPHENOUS VEIN GRAFT TO OM AND RCA, VEIN HARVESTED ENDOSCOPICALLY;  Surgeon: Grace Isaac, MD;  Location: [place];  Service: Open Heart Surgery;  Laterality: N/A;   CORONARY STENT PLACEMENT  2007, 2011   infantile paralysis facial asymmetry     LEFT HEART CATH AND CORONARY ANGIOGRAPHY N/A 05/18/2017   Procedure: Left Heart Cath and Coronary Angiography;  Surgeon: Martinique, Peter M, MD;  Location: [place] CV LAB;  Service: Cardiovascular;  Laterality: N/A;   LEFT HEART CATH AND CORONARY ANGIOGRAPHY N/A 07/27/2018   Procedure: LEFT HEART CATH AND CORONARY ANGIOGRAPHY;  Surgeon: Martinique, Peter M, MD;  Location: [place] CV LAB;  Service: Cardiovascular;  Laterality: N/A;   LEFT HEART CATHETERIZATION WITH CORONARY ANGIOGRAM N/A 10/02/2012   Procedure: LEFT HEART CATHETERIZATION WITH CORONARY ANGIOGRAM;  Surgeon: Peter M Martinique, MD;  Location: [facility] CATH LAB;  Service: Cardiovascular;  Laterality: N/A;   LEFT HEART CATHETERIZATION WITH CORONARY ANGIOGRAM N/A 02/20/2013   Procedure: LEFT HEART CATHETERIZATION WITH CORONARY ANGIOGRAM;   Surgeon: Burnell Blanks, MD;  Location[facility] LAB;  Service: Cardiovascular;  Laterality: N/A;   LEFT HEART CATHETERIZATION WITH CORONARY ANGIOGRAM N/A 12/26/2013   Procedure: LEFT HEART CATHETERIZATION WITH CORONARY ANGIOGRAM;  Surgeon: Burnell Blanks, MD;  Location: [facility] CATH LAB;  Service: Cardiovascular;  Laterality: N/A;   LEFT HEART CATHETERIZATION WITH CORONARY ANGIOGRAM N/A 11/28/2014   Procedure: LEFT HEART CATHETERIZATION WITH CORONARY ANGIOGRAM;  Surgeon: Burnell Blanks, MD;  Location: [facility] CATH LAB;  Service: Cardiovascular;  Laterality: N/A;   PERCUTANEOUS CORONARY STENT INTERVENTION (PCI-S)  10/02/2012   Procedure: PERCUTANEOUS CORONARY STENT INTERVENTION (PCI-S);  Surgeon: Peter M Martinique, MD;  Location: [facility] CATH LAB;  Service: Cardiovascular;;   PERCUTANEOUS CORONARY STENT INTERVENTION (PCI-S)  02/20/2013   Procedure: PERCUTANEOUS CORONARY STENT INTERVENTION (PCI-S);  Surgeon: Burnell Blanks, MD;  Location:  [place] CATH LAB;  Service: Cardiovascular;;   PERCUTANEOUS CORONARY STENT INTERVENTION (PCI-S)  12/26/2013   Procedure: PERCUTANEOUS CORONARY STENT INTERVENTION (PCI-S);  Surgeon: Burnell Blanks, MD;  Location: [facility] CATH LAB;  Service: Cardiovascular;;   TEE WITHOUT CARDIOVERSION N/A 08/01/2018   Procedure: TRANSESOPHAGEAL ECHOCARDIOGRAM (TEE);  Surgeon: Grace Isaac, MD;  Location: [place];  Service: Open Heart Surgery;  Laterality: N/A;   WISDOM TOOTH EXTRACTION       reports that he quit smoking about 32 years ago. His smoking use included cigarettes. He has a 60.00 pack-year smoking history. He has never used smokeless tobacco. He reports that he does not drink alcohol or use drugs.  Allergies  Allergen Reactions   Brilinta [Ticagrelor] Other (See Comments)    Arthralgias & myalgias   Crestor [Rosuvastatin] Other (See Comments)    Myalgias    Family History  Problem Relation Age of Onset   Heart attack Brother 73   Colon  cancer Brother    Prostate cancer Brother    Breast cancer Sister    Stroke Sister    Diabetes Neg Hx    Hypertension Neg Hx     Prior to Admission medications   Medication Sig Start Date End Date Taking? Authorizing Provider  acetaminophen (TYLENOL) 325 MG tablet Take 2 tablets (650 mg total) by mouth every 6 (six) hours as needed for mild pain. 08/06/18   Nani Skillern, PA-C  albuterol (PROVENTIL HFA;VENTOLIN HFA) 108 (90 Base) MCG/ACT inhaler Inhale 2 puffs into the lungs every 6 (six) hours as needed for wheezing or shortness of breath. 08/15/18   Rai, Ripudeep Raliegh Ip, MD  amiodarone (PACERONE) 200 MG tablet TAKE 1 TABLET (200 MG TOTAL) BY MOUTH DAILY. 02/23/19   Burnell Blanks, MD  apixaban (ELIQUIS) 5 MG TABS tablet Take 1 tablet (5 mg total) by mouth 2 (two) times daily. 01/18/19   Burnell Blanks, MD  aspirin EC 81 MG EC tablet Take 1 tablet (81 mg total) by mouth daily. 08/06/18   Lars Pinks M, PA-C  atorvastatin (LIPITOR) 20 MG tablet TAKE 1 TABLET EVERY DAY 10/31/18   Burnell Blanks, MD  cholecalciferol (VITAMIN D) 1000 UNITS tablet Take 1,000 Units by mouth every evening.     [provider]  clopidogrel (PLAVIX) 75 MG tablet TAKE 1 TABLET EVERY DAY WITH BREAKFAST 02/23/19   Burnell Blanks, MD  doxycycline (VIBRA-TABS) 100 MG tablet Take 1 tablet (100 mg total) by mouth 2 (two) times daily. 04/17/19   Curt Bears, MD  fexofenadine (ALLEGRA) 180 MG tablet Take 180 mg by mouth daily as needed for allergies.     [provider]  fluticasone (FLONASE) 50 MCG/ACT nasal spray Place 1 spray into both nostrils daily as needed for allergies.     [provider]  furosemide (LASIX) 20 MG tablet TAKE 1 TABLET (20 MG TOTAL) BY MOUTH DAILY. 02/23/19   Burnell Blanks, MD  guaiFENesin (MUCINEX) 600 MG 12 hr tablet Take 1 tablet (600 mg total) by mouth 2 (two) times daily as needed for cough or to loosen phlegm.  08/06/18   Nani Skillern, PA-C  insulin NPH-regular Human (70-30) 100 UNIT/ML injection Inject 15 Units into the skin daily with supper.    [provider]  Iron Combinations (IRON COMPLEX PO) Take 1 tablet by mouth daily.    [provider]  isosorbide mononitrate (IMDUR) 60 MG 24 hr tablet TAKE 1 TABLET EVERY DAY 10/31/18  Burnell Blanks, MD  metFORMIN (GLUCOPHAGE) 1000 MG tablet Take 1,000 mg by mouth daily with breakfast.    [provider]  metoprolol tartrate (LOPRESSOR) 25 MG tablet TAKE 1 TABLET TWICE DAILY 12/21/18   Burnell Blanks, MD  Multiple Vitamin (MULTIVITAMIN) tablet Take 1 tablet by mouth at bedtime.     [provider]  nitroGLYCERIN (NITROSTAT) 0.4 MG SL tablet Place 1 tablet (0.4 mg total) under the tongue every 5 (five) minutes as needed for chest pain. 05/12/19 08/10/19  Burnell Blanks, MD  ondansetron (ZOFRAN ODT) 4 MG disintegrating tablet Take 1 tablet (4 mg total) by mouth every 8 (eight) hours as needed for nausea or vomiting. Patient not taking: Reported on 11/21/2018 08/15/18   Rai, Vernelle Emerald, MD  predniSONE (DELTASONE) 20 MG tablet Take 4 tabs po qd x 7 days, followed by 3 tab po qd x 7 days, followed by 2 tab po qd x 7 days, then one tab po qd x 7 days, then 1/2 tab po qd x one week 04/17/19   Curt Bears, MD  prochlorperazine (COMPAZINE) 10 MG tablet Take 1 tablet (10 mg total) by mouth every 6 (six) hours as needed for nausea or vomiting. Patient not taking: Reported on 11/21/2018 08/29/18   Curt Bears, MD  vitamin E 100 UNIT capsule Take 100 Units by mouth at bedtime.     [provider]    Physical Exam: Constitutional: Moderately built and nourished. Vitals:   06/04/19 1730 06/04/19 1912 06/04/19 1952 06/04/19 2000  BP: (!) 117/49 (!) 112/53 (!) 113/50 (!) 105/54  Pulse:  64 62 60  Resp: 14 15 19 16   Temp:      TempSrc:      SpO2:  99% 97% 97%   Eyes: Anicteric no  pallor. ENMT: No discharge from the ears eyes nose or mouth. Neck: No mass felt.  No neck rigidity. Respiratory: No rhonchi or crepitations. Cardiovascular: S1-S2 heard. Abdomen: Soft nontender bowel sounds present. Musculoskeletal: No edema. Skin: No rash. Neurologic: Alert awake oriented to place person.  Moves all extremities. Psychiatric: Oriented to person place.   Labs on Admission: I have personally reviewed following labs and imaging studies  CBC: Recent Labs  Lab 06/04/19 1725  WBC 9.8  HGB 11.9*  HCT 36.9*  MCV 88.7  PLT 628   Basic Metabolic Panel: Recent Labs  Lab 06/04/19 1725  NA 132*  K 4.7  CL 95*  CO2 25  GLUCOSE 428*  BUN 21  CREATININE 1.70*  CALCIUM 9.5   GFR: Estimated Creatinine Clearance: 36.2 mL/min (A) (by C-G formula based on SCr of 1.7 mg/dL (H)). Liver Function Tests: No results for input(s): AST, ALT, ALKPHOS, BILITOT, PROT, ALBUMIN in the last 168 hours. No results for input(s): LIPASE, AMYLASE in the last 168 hours. No results for input(s): AMMONIA in the last 168 hours. Coagulation Profile: No results for input(s): INR, PROTIME in the last 168 hours. Cardiac Enzymes: No results for input(s): CKTOTAL, CKMB, CKMBINDEX, TROPONINI in the last 168 hours. BNP (last 3 results) No results for input(s): PROBNP in the last 8760 hours. HbA1C: No results for input(s): HGBA1C in the last 72 hours. CBG: Recent Labs  Lab 05/29/19 1133 05/29/19 1516 06/04/19 1731  GLUCAP 331* 380* 388*   Lipid Profile: No results for input(s): CHOL, HDL, LDLCALC, TRIG, CHOLHDL, LDLDIRECT in the last 72 hours. Thyroid Function Tests: No results for input(s): TSH, T4TOTAL, FREET4, T3FREE, THYROIDAB in the last 72 hours.  Anemia Panel: No results for input(s): VITAMINB12, FOLATE, FERRITIN, TIBC, IRON, RETICCTPCT in the last 72 hours. Urine analysis:    Component Value Date/Time   COLORURINE STRAW (A) 06/04/2019 1711   APPEARANCEUR CLEAR 06/04/2019 1711     LABSPEC 1.016 06/04/2019 1711   PHURINE 5.0 06/04/2019 1711   GLUCOSEU >=500 (A) 06/04/2019 1711   HGBUR SMALL (A) 06/04/2019 1711   BILIRUBINUR NEGATIVE 06/04/2019 1711   KETONESUR NEGATIVE 06/04/2019 1711   PROTEINUR NEGATIVE 06/04/2019 1711   NITRITE NEGATIVE 06/04/2019 1711   LEUKOCYTESUR SMALL (A) 06/04/2019 1711   Sepsis Labs: @LABRCNTIP (procalcitonin:4,lacticidven:4) )No results found for this or any previous visit (from the past 240 hour(s)).   Radiological Exams on Admission: Ct Head Wo Contrast  Result Date: 06/04/2019 CLINICAL DATA:  Fall, history of small cell lung cancer EXAM: CT HEAD WITHOUT CONTRAST TECHNIQUE: Contiguous axial images were obtained from the base of the skull through the vertex without intravenous contrast. COMPARISON:  Brain MRI 08/10/2018 FINDINGS: Brain: New large cavitary lesion in the high RIGHT frontal lobe with thickened wall measures 3.9 x 3.0 cm. There is extensive vasogenic edema associated with this new large lesion. Similar lesion in the LEFT frontal lobe measuring 1.9 x 1.9 cm also with vasogenic edema. Smaller midline LEFT frontal lesion measuring 1.7 cm along the anterior interhemispheric fissure. Additional lesion in the LEFT cerebellum measuring 2.0 cm Lesion in the head of the RIGHT caudate nucleus is high-density measuring 1.7 cm. Basal cisterns are patent.  No hydrocephalus. No acute intracranial hemorrhage.  No intraventricular hemorrhage Vascular: No hyperdense vessel or unexpected calcification. Skull: Normal. Negative for fracture or focal lesion. Sinuses/Orbits: No acute finding. Other: IMPRESSION: 1. Unfortunately, there are new parenchymal cavitary masses with extensive vasogenic edema consistent with multifocal intracranial metastasis with central necrosis. 2. Metastatic lesions include the high RIGHT frontal lobe, anterior LEFT frontal lobe, head of the RIGHT caudate nucleus, and LEFT cerebellum. 3. No hydrocephalus. Basal cisterns patent.  No intracranial hemorrhage , Findings conveyed to[place] 06/04/2019  at18:16. Electronically Signed   By: Suzy Bouchard M.D.   On: 06/04/2019 18:17   Dg Chest Port 1 View  Result Date: 06/04/2019 CLINICAL DATA:  Syncope.  History of lung carcinoma EXAM: PORTABLE CHEST 1 VIEW COMPARISON:  Chest CT May 25, 2019; chest radiograph September 16, 2019 FINDINGS: There are areas of ill-defined opacity in the right base, left base, and left mid lung. The apparent consolidation seen in the left mid lung on recent CT is not appreciable by radiography. There is underlying emphysematous change, better delineated on recent CT. Heart size and pulmonary vascularity are normal. No adenopathy is identified by radiography. Patient is status post coronary artery bypass grafting. There is a left atrial appendage clamp present. There is aortic atherosclerosis. No bone lesions. IMPRESSION: Areas of ill-defined opacity in the lung bases and left mid lung regions. No consolidation or masses evident by radiography. Cardiac silhouette within normal limits. No adenopathy appreciable. Postoperative changes noted. Aortic Atherosclerosis (ICD10-I70.0) and Emphysema (ICD10-J43.9). Electronically Signed   By: Lowella Grip III M.D.   On: 06/04/2019 17:29    EKG: Independently reviewed.  Normal sinus rhythm with prolonged QTC and T wave inversions.  Assessment/Plan Active Problems:   Essential hypertension   Chronic diastolic CHF (congestive heart failure) (HCC)   PAF (paroxysmal atrial fibrillation) (HCC)   CKD (chronic kidney disease), stage III (HCC)   Normochromic normocytic anemia   S/P CABG x 3   Malignant neoplasm of bronchus of right upper  lobe (HCC)   Chronic respiratory failure (HCC)   COPD (chronic obstructive pulmonary disease) (HCC)   Syncope   Metastatic cancer to brain ([place])    1. Metastatic brain lesions with vasogenic edema in a patient with known history of extensive stage small cell lung cancer-I  have discussed with Dr. Burr Medico oncologist who advised to transfer patient to [facility] long hospital keep patient on Decadron and will need to get MRI brain with and without contrast.  At this time patient's creatinine is elevated and is chronic kidney disease.  Will try to gently hydrate and if creatinine improves may get MRI brain with contrast otherwise may have to get without. 2. Syncope likely from weakness and dehydration -patient EKG does show prolonged QTC.  Patient blood pressure also was in the low normal side.  For now I am gently hydrating.  May have to hold metoprolol if blood pressure remains low.  I have written with holding parameters.  Consulted cardiology.  Did discuss with on-call neurologist about patient's incontinence of urine and metastatic brain lesions.  Neurology advised no antiepileptics at this time to follow MRI brain and EEG. 3. Paroxysmal atrial fibrillation on metoprolol amiodarone and apixaban.  Discussed with oncologist about continuing apixaban and they are okay. 4. Diabetes mellitus type 2 uncontrolled with hyperglycemia likely from recent use of prednisone which patient had taken after chemotherapy.  Now since patient is on Decadron will closely follow CBGs every 4 for now and I have placed patient on Lantus 20 units.  Follow metabolic panel and CBGs closely. 5. CAD status post CABG with mildly elevated high-sensitivity troponin.  Patient grandson did state that patient had some chest pain last few weeks but patient at this time denies any.  We will continue apixaban metoprolol statins.  Cardiology notified. 6. Chronic respiratory failure secondary to COPD and extensive stage lung cancer.  Not wheezing.  Closely observe. 7. Chronic anemia likely from renal disease and chemotherapy follow CBC. 8. Chronic kidney disease stage III creatinine appears to be at baseline. 9. History of chronic diastolic CHF presently Lasix on hold due to low normal blood pressure and also gently  hydrating.   DVT prophylaxis: Apixaban. Code Status: Full code confirmed with patient's grandson. Family Communication: Patient's grandson. Disposition Plan: To be determined. Consults called: Oncology and cardiology. Admission status: Observation.   Rise Patience MD [facility] Pager [phone].  If 7PM-7AM, please contact night-coverage [email or web address] Password [facility]  06/04/2019, 9:26 PM

## 2019-06-05 ENCOUNTER — Ambulatory Visit
Admission: RE | Admit: 2019-06-05 | Discharge: 2019-06-05 | Disposition: A | Discharge status: Home / Self Care | Payer: Medicare HMO | Source: Ambulatory Visit | Attending: Radiation Oncology | Admitting: Radiation Oncology

## 2019-06-05 ENCOUNTER — Other Ambulatory Visit: Payer: Self-pay

## 2019-06-05 ENCOUNTER — Observation Stay (HOSPITAL_COMMUNITY): Payer: Medicare HMO

## 2019-06-05 ENCOUNTER — Ambulatory Visit
Admit: 2019-06-05 | Discharge: 2019-06-05 | Disposition: A | Discharge status: Home / Self Care | Payer: Medicare HMO | Source: Ambulatory Visit | Attending: Radiation Oncology | Admitting: Radiation Oncology

## 2019-06-05 ENCOUNTER — Encounter: Payer: Self-pay | Admitting: Radiation Oncology

## 2019-06-05 VITALS — BP 118/48 | HR 59 | Temp 98.2°F | Resp 18 | Ht 71.0 in

## 2019-06-05 DIAGNOSIS — I5032 Chronic diastolic (congestive) heart failure: Secondary | ICD-10-CM | POA: Diagnosis present

## 2019-06-05 DIAGNOSIS — D649 Anemia, unspecified: Secondary | ICD-10-CM | POA: Diagnosis present

## 2019-06-05 DIAGNOSIS — D329 Benign neoplasm of meninges, unspecified: Secondary | ICD-10-CM | POA: Diagnosis present

## 2019-06-05 DIAGNOSIS — C7931 Secondary malignant neoplasm of brain: Secondary | ICD-10-CM

## 2019-06-05 DIAGNOSIS — E1122 Type 2 diabetes mellitus with diabetic chronic kidney disease: Secondary | ICD-10-CM | POA: Diagnosis present

## 2019-06-05 DIAGNOSIS — C7951 Secondary malignant neoplasm of bone: Secondary | ICD-10-CM | POA: Diagnosis not present

## 2019-06-05 DIAGNOSIS — J9610 Chronic respiratory failure, unspecified whether with hypoxia or hypercapnia: Secondary | ICD-10-CM | POA: Diagnosis present

## 2019-06-05 DIAGNOSIS — G936 Cerebral edema: Secondary | ICD-10-CM | POA: Diagnosis present

## 2019-06-05 DIAGNOSIS — E860 Dehydration: Secondary | ICD-10-CM | POA: Diagnosis present

## 2019-06-05 DIAGNOSIS — R32 Unspecified urinary incontinence: Secondary | ICD-10-CM | POA: Diagnosis present

## 2019-06-05 DIAGNOSIS — I252 Old myocardial infarction: Secondary | ICD-10-CM | POA: Diagnosis not present

## 2019-06-05 DIAGNOSIS — N183 Chronic kidney disease, stage 3 (moderate): Secondary | ICD-10-CM | POA: Diagnosis present

## 2019-06-05 DIAGNOSIS — E119 Type 2 diabetes mellitus without complications: Secondary | ICD-10-CM | POA: Diagnosis not present

## 2019-06-05 DIAGNOSIS — C3490 Malignant neoplasm of unspecified part of unspecified bronchus or lung: Secondary | ICD-10-CM | POA: Diagnosis present

## 2019-06-05 DIAGNOSIS — I2510 Atherosclerotic heart disease of native coronary artery without angina pectoris: Secondary | ICD-10-CM | POA: Diagnosis present

## 2019-06-05 DIAGNOSIS — I130 Hypertensive heart and chronic kidney disease with heart failure and stage 1 through stage 4 chronic kidney disease, or unspecified chronic kidney disease: Secondary | ICD-10-CM | POA: Diagnosis present

## 2019-06-05 DIAGNOSIS — R079 Chest pain, unspecified: Secondary | ICD-10-CM | POA: Diagnosis present

## 2019-06-05 DIAGNOSIS — I255 Ischemic cardiomyopathy: Secondary | ICD-10-CM | POA: Diagnosis present

## 2019-06-05 DIAGNOSIS — I480 Paroxysmal atrial fibrillation: Secondary | ICD-10-CM | POA: Diagnosis present

## 2019-06-05 DIAGNOSIS — C719 Malignant neoplasm of brain, unspecified: Secondary | ICD-10-CM | POA: Diagnosis present

## 2019-06-05 DIAGNOSIS — J449 Chronic obstructive pulmonary disease, unspecified: Secondary | ICD-10-CM | POA: Diagnosis present

## 2019-06-05 DIAGNOSIS — Z5112 Encounter for antineoplastic immunotherapy: Secondary | ICD-10-CM | POA: Diagnosis not present

## 2019-06-05 DIAGNOSIS — E1165 Type 2 diabetes mellitus with hyperglycemia: Secondary | ICD-10-CM | POA: Diagnosis present

## 2019-06-05 DIAGNOSIS — R0789 Other chest pain: Secondary | ICD-10-CM | POA: Diagnosis not present

## 2019-06-05 DIAGNOSIS — Z20828 Contact with and (suspected) exposure to other viral communicable diseases: Secondary | ICD-10-CM | POA: Diagnosis present

## 2019-06-05 DIAGNOSIS — E785 Hyperlipidemia, unspecified: Secondary | ICD-10-CM | POA: Diagnosis present

## 2019-06-05 DIAGNOSIS — N179 Acute kidney failure, unspecified: Secondary | ICD-10-CM | POA: Diagnosis present

## 2019-06-05 DIAGNOSIS — R55 Syncope and collapse: Secondary | ICD-10-CM | POA: Diagnosis not present

## 2019-06-05 DIAGNOSIS — K08409 Partial loss of teeth, unspecified cause, unspecified class: Secondary | ICD-10-CM | POA: Diagnosis present

## 2019-06-05 DIAGNOSIS — E871 Hypo-osmolality and hyponatremia: Secondary | ICD-10-CM | POA: Diagnosis present

## 2019-06-05 LAB — COMPREHENSIVE METABOLIC PANEL
ALT: 20 U/L (ref 0–44)
AST: 21 U/L (ref 15–41)
Albumin: 3.6 g/dL (ref 3.5–5.0)
Alkaline Phosphatase: 86 U/L (ref 38–126)
Anion gap: 11 (ref 5–15)
BUN: 22 mg/dL (ref 8–23)
CO2: 24 mmol/L (ref 22–32)
Calcium: 9.1 mg/dL (ref 8.9–10.3)
Chloride: 99 mmol/L (ref 98–111)
Creatinine, Ser: 1.35 mg/dL — ABNORMAL HIGH (ref 0.61–1.24)
GFR calc Af Amer: 58 mL/min — ABNORMAL LOW (ref >60)
GFR calc non Af Amer: 50 mL/min — ABNORMAL LOW (ref >60)
Glucose, Bld: 220 mg/dL — ABNORMAL HIGH (ref 70–99)
Potassium: 4.2 mmol/L (ref 3.5–5.1)
Sodium: 134 mmol/L — ABNORMAL LOW (ref 135–145)
Total Bilirubin: 0.5 mg/dL (ref 0.3–1.2)
Total Protein: 7 g/dL (ref 6.5–8.1)

## 2019-06-05 LAB — HEMOGLOBIN A1C
Hgb A1c MFr Bld: 10.8 % — ABNORMAL HIGH (ref 4.8–5.6)
Mean Plasma Glucose: 263.26 mg/dL

## 2019-06-05 LAB — GLUCOSE, CAPILLARY
Glucose-Capillary: 209 mg/dL — ABNORMAL HIGH (ref 70–99)
Glucose-Capillary: 240 mg/dL — ABNORMAL HIGH (ref 70–99)
Glucose-Capillary: 320 mg/dL — ABNORMAL HIGH (ref 70–99)
Glucose-Capillary: 339 mg/dL — ABNORMAL HIGH (ref 70–99)
Glucose-Capillary: 359 mg/dL — ABNORMAL HIGH (ref 70–99)
Glucose-Capillary: 405 mg/dL — ABNORMAL HIGH (ref 70–99)

## 2019-06-05 LAB — CBC WITH DIFFERENTIAL/PLATELET
Abs Immature Granulocytes: 0.03 10*3/uL (ref 0.00–0.07)
Basophils Absolute: 0 10*3/uL (ref 0.0–0.1)
Basophils Relative: 0 %
Eosinophils Absolute: 0 10*3/uL (ref 0.0–0.5)
Eosinophils Relative: 0 %
HCT: 37.3 % — ABNORMAL LOW (ref 39.0–52.0)
Hemoglobin: 11.6 g/dL — ABNORMAL LOW (ref 13.0–17.0)
Immature Granulocytes: 0 %
Lymphocytes Relative: 11 %
Lymphs Abs: 0.8 10*3/uL (ref 0.7–4.0)
MCH: 28.2 pg (ref 26.0–34.0)
MCHC: 31.1 g/dL (ref 30.0–36.0)
MCV: 90.5 fL (ref 80.0–100.0)
Monocytes Absolute: 0.1 10*3/uL (ref 0.1–1.0)
Monocytes Relative: 2 %
Neutro Abs: 6.3 10*3/uL (ref 1.7–7.7)
Neutrophils Relative %: 87 %
Platelets: 236 10*3/uL (ref 150–400)
RBC: 4.12 MIL/uL — ABNORMAL LOW (ref 4.22–5.81)
RDW: 14.8 % (ref 11.5–15.5)
WBC: 7.2 10*3/uL (ref 4.0–10.5)
nRBC: 0 % (ref 0.0–0.2)

## 2019-06-05 LAB — ABO/RH: ABO/RH(D): B POS

## 2019-06-05 LAB — TROPONIN I (HIGH SENSITIVITY): Troponin I (High Sensitivity): 28 ng/L — ABNORMAL HIGH (ref <18)

## 2019-06-05 LAB — TYPE AND SCREEN
ABO/RH(D): B POS
Antibody Screen: NEGATIVE

## 2019-06-05 LAB — SARS CORONAVIRUS 2 (TAT 6-24 HRS): SARS Coronavirus 2: NEGATIVE

## 2019-06-05 MED ORDER — SODIUM CHLORIDE 0.9 % IV SOLN
INTRAVENOUS | Status: DC
  Administered 2019-06-05 – 2019-06-06 (×2): via INTRAVENOUS

## 2019-06-05 MED ORDER — ENSURE ENLIVE PO LIQD
237.0000 mL | Freq: Two times a day (BID) | ORAL | Status: DC
  Administered 2019-06-05: 237 mL via ORAL

## 2019-06-05 MED ORDER — INSULIN ASPART 100 UNIT/ML ~~LOC~~ SOLN
0.0000 [IU] | SUBCUTANEOUS | Status: DC
  Administered 2019-06-05: 15 [IU] via SUBCUTANEOUS
  Administered 2019-06-06 (×2): 8 [IU] via SUBCUTANEOUS
  Administered 2019-06-06 (×2): 3 [IU] via SUBCUTANEOUS
  Administered 2019-06-07: 2 [IU] via SUBCUTANEOUS
  Administered 2019-06-07: 5 [IU] via SUBCUTANEOUS
  Administered 2019-06-07: 15 [IU] via SUBCUTANEOUS
  Administered 2019-06-07: 3 [IU] via SUBCUTANEOUS
  Administered 2019-06-07: 11 [IU] via SUBCUTANEOUS
  Administered 2019-06-08: 3 [IU] via SUBCUTANEOUS
  Administered 2019-06-08: 8 [IU] via SUBCUTANEOUS
  Administered 2019-06-08: 3 [IU] via SUBCUTANEOUS

## 2019-06-05 MED ORDER — GADOBUTROL 1 MMOL/ML IV SOLN
7.0000 mL | Freq: Once | INTRAVENOUS | Status: AC | PRN
  Administered 2019-06-05: 7 mL via INTRAVENOUS

## 2019-06-05 MED ORDER — INSULIN ASPART 100 UNIT/ML ~~LOC~~ SOLN
11.0000 [IU] | Freq: Once | SUBCUTANEOUS | Status: AC
  Administered 2019-06-05: 11 [IU] via SUBCUTANEOUS

## 2019-06-05 NOTE — Progress Notes (Signed)
This chaplain attempted a spiritual care visit.  The Pt. was asleep at the time of the visit.  The chaplain is available for F/U spiritual care as needed.

## 2019-06-05 NOTE — Progress Notes (Signed)
PROGRESS NOTE    KAYTON DUNAJ  ZOX:[id number] DOB: May 28, 1941 DOA: 06/04/2019 PCP: Emmaline Kluver, MD     Brief Narrative:  BRENDAN GADSON is a 78 y.o. male with history of extensive stage small cell lung cancer on palliative chemotherapy with history of CAD status post CABG last year, chronic diastolic CHF last EF measured in October 2019 was 41 to 50% with grade 2 diastolic dysfunction, chronic and disease stage III, diabetes mellitus type 2, anemia, paroxysmal atrial fibrillation was brought to the ER after patient had a syncopal episode.  As per patient's grandson this is a second syncopal episode in the last 24 hours.  Patient's last chemotherapy was last week.  Over the last 1 week patient has had significant decline in his function and finds it very difficult to ambulate because of weakness.  About a week ago patient was found weak in his hall at home and had incontinence of urine at that time.  24 hours ago patient was trying to walk to the bathroom when he passed out same thing happened today.  Exact number of minutes patient pattern is not clear but did have incontinence of urine after the pass out and appeared confused.  Per patient's grandson patient has been having some chest pain off and on for last few weeks.  Patient did not have any nausea vomiting abdominal pain diarrhea though was treated for colitis in June with antibiotics. CT head was done which shows metastatic lesions with edema.  On-call oncologist Dr. Burr Medico was consulted by the ER physician and at this time oncology advised patient to be transferred to [facility] long hospital.  Decadron 8 mg IV every 8 was started.    New events last 24 hours / Subjective: Patient sitting in bed, eating breakfast.  He has no complaints this morning.  He seems to be a poor historian however, states that he came to the hospital because he fell but unable to contribute more to the history.  Denies any headaches, chest pain, shortness of breath,  nausea, vomiting, weakness.  Assessment & Plan:   Active Problems:   Essential hypertension   Chronic diastolic CHF (congestive heart failure) (HCC)   PAF (paroxysmal atrial fibrillation) (HCC)   CKD (chronic kidney disease), stage III (HCC)   Normochromic normocytic anemia   S/P CABG x 3   Malignant neoplasm of bronchus of right upper lobe (HCC)   Chronic respiratory failure (HCC)   COPD (chronic obstructive pulmonary disease) (HCC)   Syncope   Metastatic cancer to brain [facility])   Metastatic brain lesion with vasogenic edema, small cell lung cancer -CT head: new parenchymal cavitary masses with extensive vasogenic edema consistent with multifocal intracranial metastasis with central necrosis. Metastatic lesions include the high RIGHT frontal lobe, anterior LEFT frontal lobe, head of the RIGHT caudate nucleus, and LEFT Cerebellum. -Continue IV Decadron -MRI brain pending -Oncology consult  Falls, ?syncope -Secondary to dehydration, weakness. In discussing with wife, she denies any loss of consciousness but instead of falling due to weakness   -Check orthostatic vital sign -Discussed briefly with cardiology NP, hold off on cardiology consult at this time.  Does not appear to be cardiogenic syncope in nature -Continue telemetry  Paroxysmal atrial fibrillation -Amiodarone, metoprolol, Eliquis  Diabetes mellitus type 2, poorly controlled -Ha1c 10.8 -Lantus, sliding scale insulin  CAD status post CABG -Patient denies any chest pain at this time. Troponin has now trended downward -Hold off on cardiology consult at this time -Continue aspirin, plavix,  lipitor   Chronic respiratory failure secondary to COPD, extensive stage small cell lung cancer -Stable currently  Chronic kidney disease stage III -Baseline Cr 1.5-1.9 -Creatinine improved today -Continue to monitor BMP  Chronic diastolic CHF -Does not appear to be in exacerbation at this time, monitor closely       DVT  prophylaxis: Eliquis Code Status: Full code Family Communication: Called and discussed with wife over the phone  Disposition Plan: Pending MRI, oncology consultation   Consultants:   Oncology  Procedures:   None  Antimicrobials:  Anti-infectives (From admission, onward)   None        Objective: Vitals:   06/04/19 2143 06/04/19 2200 06/05/19 0413 06/05/19 1042  BP: (!) 108/51  (!) 98/55 125/64  Pulse: 62  (!) 52 66  Resp: 16  16   Temp: 98.7 F (37.1 C)  98.2 F (36.8 C)   TempSrc: Oral  Oral   SpO2: 98%  96%   Weight:  71 kg 70.2 kg   Height:  5\' 11"  (1.803 m)      Intake/Output Summary (Last 24 hours) at 06/05/2019 1110 Last data filed at 06/05/2019 1053 Gross per 24 hour  Intake 409.24 ml  Output 650 ml  Net -240.76 ml   Filed Weights   06/04/19 2200 06/05/19 0413  Weight: 71 kg 70.2 kg    Examination:  General exam: Appears calm and comfortable   Respiratory system: Clear to auscultation. Respiratory effort normal. Cardiovascular system: S1 & S2 heard, RRR. No JVD, murmurs, rubs, gallops or clicks. No pedal edema. Gastrointestinal system: Abdomen is nondistended, soft and nontender. No organomegaly or masses felt. Normal bowel sounds heard. Central nervous system: Alert and oriented. No focal neurological deficits. Extremities: Symmetric 5 x 5 power. Skin: No rashes, lesions or ulcers Psychiatry: Poor historian   Data Reviewed: I have personally reviewed following labs and imaging studies  CBC: Recent Labs  Lab 06/04/19 1725 06/05/19 0546  WBC 9.8 7.2  NEUTROABS  --  6.3  HGB 11.9* 11.6*  HCT 36.9* 37.3*  MCV 88.7 90.5  PLT 217 657   Basic Metabolic Panel: Recent Labs  Lab 06/04/19 1725 06/05/19 0546  NA 132* 134*  K 4.7 4.2  CL 95* 99  CO2 25 24  GLUCOSE 428* 220*  BUN 21 22  CREATININE 1.70* 1.35*  CALCIUM 9.5 9.1   GFR: Estimated Creatinine Clearance: 44.8 mL/min (A) (by C-G formula based on SCr of 1.35 mg/dL (H)). Liver  Function Tests: Recent Labs  Lab 06/05/19 0546  AST 21  ALT 20  ALKPHOS 86  BILITOT 0.5  PROT 7.0  ALBUMIN 3.6   No results for input(s): LIPASE, AMYLASE in the last 168 hours. No results for input(s): AMMONIA in the last 168 hours. Coagulation Profile: No results for input(s): INR, PROTIME in the last 168 hours. Cardiac Enzymes: No results for input(s): CKTOTAL, CKMB, CKMBINDEX, TROPONINI in the last 168 hours. BNP (last 3 results) No results for input(s): PROBNP in the last 8760 hours. HbA1C: Recent Labs    06/05/19 0546  HGBA1C 10.8*   CBG: Recent Labs  Lab 05/29/19 1516 06/04/19 1731 06/05/19 0006 06/05/19 0410 06/05/19 0759  GLUCAP 380* 388* 339* 240* 209*   Lipid Profile: No results for input(s): CHOL, HDL, LDLCALC, TRIG, CHOLHDL, LDLDIRECT in the last 72 hours. Thyroid Function Tests: No results for input(s): TSH, T4TOTAL, FREET4, T3FREE, THYROIDAB in the last 72 hours. Anemia Panel: No results for input(s): VITAMINB12, FOLATE, FERRITIN, TIBC, IRON, RETICCTPCT  in the last 72 hours. Sepsis Labs: No results for input(s): PROCALCITON, LATICACIDVEN in the last 168 hours.  Recent Results (from the past 240 hour(s))  SARS CORONAVIRUS 2 Nasal Swab Aptima Multi Swab     Status: None   Collection Time: 06/04/19  7:12 PM   Specimen: Aptima Multi Swab; Nasal Swab  Result Value Ref Range Status   SARS Coronavirus 2 NEGATIVE NEGATIVE Final    Comment: (NOTE) SARS-CoV-2 target nucleic acids are NOT DETECTED. The SARS-CoV-2 RNA is generally detectable in upper and lower respiratory specimens during the acute phase of infection. Negative results do not preclude SARS-CoV-2 infection, do not rule out co-infections with other pathogens, and should not be used as the sole basis for treatment or other patient management decisions. Negative results must be combined with clinical observations, patient history, and epidemiological information. The expected result is  Negative. Fact Sheet for Patients: SugarRoll.be Fact Sheet for Healthcare Providers: https://www.woods-mathews.com/ This test is not yet approved or cleared by the [place] FDA and  has been authorized for detection and/or diagnosis of SARS-CoV-2 by FDA under an Emergency Use Authorization (EUA). This EUA will remain  in effect (meaning this test can be used) for the duration of the COVID-19 declaration under Section 56 4(b)(1) of the Act, 21 U.S.C. section 360bbb-3(b)(1), unless the authorization is terminated or revoked sooner. Performed at [facility], [place] [address] [place], [place] [address]       Radiology Studies: Ct Head Wo Contrast  Result Date: 06/04/2019 CLINICAL DATA:  Fall, history of small cell lung cancer EXAM: CT HEAD WITHOUT CONTRAST TECHNIQUE: Contiguous axial images were obtained from the base of the skull through the vertex without intravenous contrast. COMPARISON:  Brain MRI 08/10/2018 FINDINGS: Brain: New large cavitary lesion in the high RIGHT frontal lobe with thickened wall measures 3.9 x 3.0 cm. There is extensive vasogenic edema associated with this new large lesion. Similar lesion in the LEFT frontal lobe measuring 1.9 x 1.9 cm also with vasogenic edema. Smaller midline LEFT frontal lesion measuring 1.7 cm along the anterior interhemispheric fissure. Additional lesion in the LEFT cerebellum measuring 2.0 cm Lesion in the head of the RIGHT caudate nucleus is high-density measuring 1.7 cm. Basal cisterns are patent.  No hydrocephalus. No acute intracranial hemorrhage.  No intraventricular hemorrhage Vascular: No hyperdense vessel or unexpected calcification. Skull: Normal. Negative for fracture or focal lesion. Sinuses/Orbits: No acute finding. Other: IMPRESSION: 1. Unfortunately, there are new parenchymal cavitary masses with extensive vasogenic edema consistent with multifocal intracranial metastasis with central  necrosis. 2. Metastatic lesions include the high RIGHT frontal lobe, anterior LEFT frontal lobe, head of the RIGHT caudate nucleus, and LEFT cerebellum. 3. No hydrocephalus. Basal cisterns patent. No intracranial hemorrhage , Findings conveyed to[place]on 06/04/2019  at18:16. Electronically Signed   By: Suzy Bouchard M.D.   On: 06/04/2019 18:17   Dg Chest Port 1 View  Result Date: 06/04/2019 CLINICAL DATA:  Syncope.  History of lung carcinoma EXAM: PORTABLE CHEST 1 VIEW COMPARISON:  Chest CT May 25, 2019; chest radiograph September 16, 2019 FINDINGS: There are areas of ill-defined opacity in the right base, left base, and left mid lung. The apparent consolidation seen in the left mid lung on recent CT is not appreciable by radiography. There is underlying emphysematous change, better delineated on recent CT. Heart size and pulmonary vascularity are normal. No adenopathy is identified by radiography. Patient is status post coronary artery bypass grafting. There is a left atrial appendage  clamp present. There is aortic atherosclerosis. No bone lesions. IMPRESSION: Areas of ill-defined opacity in the lung bases and left mid lung regions. No consolidation or masses evident by radiography. Cardiac silhouette within normal limits. No adenopathy appreciable. Postoperative changes noted. Aortic Atherosclerosis (ICD10-I70.0) and Emphysema (ICD10-J43.9). Electronically Signed   By: Lowella Grip III M.D.   On: 06/04/2019 17:29      Scheduled Meds: . amiodarone  200 mg Oral Daily  . apixaban  5 mg Oral BID  . aspirin EC  81 mg Oral Daily  . atorvastatin  20 mg Oral q1800  . cholecalciferol  1,000 Units Oral QPM  . clopidogrel  75 mg Oral Daily  . dexamethasone  8 mg Oral Q8H  . feeding supplement (ENSURE ENLIVE)  237 mL Oral BID BM  . insulin aspart  0-9 Units Subcutaneous Q4H  . insulin glargine  20 Units Subcutaneous QHS  . metoprolol tartrate  25 mg Oral BID  . multivitamin with minerals  1 tablet  Oral QHS  . vitamin E  100 Units Oral QHS   Continuous Infusions: . sodium chloride       LOS: 0 days      Time spent: 35 minutes   Dessa Phi, DO [facility] [email or web address] 06/05/2019, 11:10 AM

## 2019-06-05 NOTE — Progress Notes (Signed)
  Radiation Oncology         ([phone]) [phone] ________________________________  Name: Gregory Bates MRN: [id number]  Date: 06/05/2019  DOB: 12/28/1940  SIMULATION AND TREATMENT PLANNING NOTE    ICD-10-CM   1. Metastatic small cell carcinoma to brain [facility])  C79.31     DIAGNOSIS:  Extensive stage (T1 a, N2, M1a) small cell lung cancer, now with brain metastases  NARRATIVE:  The patient was brought to the [place] [place].  Identity was confirmed.  All relevant records and images related to the planned course of therapy were reviewed.  The patient freely provided informed written consent to proceed with treatment after reviewing the details related to the planned course of therapy. The consent form was witnessed and verified by the simulation staff.  Then, the patient was set-up in a stable reproducible  supine position for radiation therapy.  CT images were obtained.  Surface markings were placed.  The CT images were loaded into the planning software.  Then the target and avoidance structures were contoured.  Treatment planning then occurred.  The radiation prescription was entered and confirmed.  Then, I designed and supervised the construction of a total of 3 medically necessary complex treatment devices.  I have requested : Isodose Plan.  I have ordered:dose calc.  PLAN:  The patient will receive 30 Gy in 10 fractions directed at the whole brain.  The patient will begin his treatment tomorrow.  -----------------------------------  Blair Promise, PhD, MD  This document serves as a record of services personally performed by [name], MD. It was created on his behalf by Wilburn Mylar, a trained medical scribe. The creation of this record is based on the scribe's personal observations and the provider's statements to them. This document has been checked and approved by the attending provider.

## 2019-06-05 NOTE — Progress Notes (Signed)
Location/Histology of Brain Tumor:  Course: In the ER patient blood pressure initially was in the low normal which improved with fluids.  High-sensitivity troponin was 1554 labs show hemoglobin of 11.9 platelets 217 creatinine 1.7 sodium 132 EKG shows T wave inversions in leads with prolonged QTC.  Blood sugar wMetastatic brain lesions with vasogenic edema in a patient with known history of extensive stage small cell lung cancer-I have discussed with Dr. Burr Medico oncologist who advised to transfer patient to [facility] long hospital keep patient on Decadron and will need to get MRI brain with and without contrast.  At this time patient's creatinine is elevated and is chronic kidney disease.  Will try to gently hydrate and if creatinine improves may get MRI brain with contrast otherwise may have to get withoutas 428 with anion gap of 12.  Chest x-ray shows densities.  Patient has no history of lung cancer.  CT head was done which shows metastatic lesions with edema.  On-call oncologist Dr. Burr Medico was consulted by the ER physician and at this time oncology advised patient to be transferred to [facility] long hospital.  Decadron 8 mg IV every 8 was started.  On my exam patient is eating on his bed and denies any chest pain or shortness of breath is able to move all extremities denies any pain Patient presented with symptoms of:    Past or anticipated interventions, if any, per neurosurgery: none  Past or anticipated interventions, if any, per medical oncology: none  Dose of Decadron, if applicable: 8 mg IV in ED  Recent neurologic symptoms, if any:   Seizures: no  Headaches: no  Nausea: yes  Dizziness/ataxia: yes  Difficulty with hand coordination: no  Focal numbness/weakness: no  Visual deficits/changes: no  Confusion/Memory deficits: no  Painful bone metastases at present, if any: no  SAFETY ISSUES:  Prior radiation? no  Pacemaker/ICD? no  Possible current pregnancy? no  Is the patient on  methotrexate? no  Additional Complaints / other details: Patient in for consult for new onset brain mets. Patient denies any seizures at present, presently on chemotherapy for small cell lung cancer. For CT sim after appointment. No pain medication on board. Ansers questions.

## 2019-06-05 NOTE — Patient Instructions (Signed)
Coronavirus (COVID-19) Are you at risk?  Are you at risk for the Coronavirus (COVID-19)?  To be considered HIGH RISK for Coronavirus (COVID-19), you have to meet the following criteria:  . Traveled to [place], [place], [place], [place] or [place]; or in the [place] to [place], [place], [place], or [place]; and have fever, cough, and shortness of breath within the last 2 weeks of travel OR . Been in close contact with a person diagnosed with COVID-19 within the last 2 weeks and have fever, cough, and shortness of breath . IF YOU DO NOT MEET THESE CRITERIA, YOU ARE CONSIDERED LOW RISK FOR COVID-19.  What to do if you are HIGH RISK for COVID-19?  . If you are having a medical emergency, call 911. . Seek medical care right away. Before you go to a doctor's office, urgent care or emergency department, call ahead and tell them about your recent travel, contact with someone diagnosed with COVID-19, and your symptoms. You should receive instructions from your physician's office regarding next steps of care.  . When you arrive at healthcare provider, tell the healthcare staff immediately you have returned from visiting [place], [place], [place], [place] or [place]; or traveled in the [place] to [place], [place], [place], or [place]; in the last two weeks or you have been in close contact with a person diagnosed with COVID-19 in the last 2 weeks.   . Tell the health care staff about your symptoms: fever, cough and shortness of breath. . After you have been seen by a medical provider, you will be either: o Tested for (COVID-19) and discharged home on quarantine except to seek medical care if symptoms worsen, and asked to  - Stay home and avoid contact with others until you get your results (4-5 days)  - Avoid travel on public transportation if possible (such as bus, train, or airplane) or o Sent to the Emergency Department by EMS for evaluation, COVID-19 testing, and possible  admission depending on your condition and test results.  What to do if you are LOW RISK for COVID-19?  Reduce your risk of any infection by using the same precautions used for avoiding the common cold or flu:  . Wash your hands often with soap and warm water for at least 20 seconds.  If soap and water are not readily available, use an alcohol-based hand sanitizer with at least 60% alcohol.  . If coughing or sneezing, cover your mouth and nose by coughing or sneezing into the elbow areas of your shirt or coat, into a tissue or into your sleeve (not your hands). . Avoid shaking hands with others and consider head nods or verbal greetings only. . Avoid touching your eyes, nose, or mouth with unwashed hands.  . Avoid close contact with people who are sick. . Avoid places or events with large numbers of people in one location, like concerts or sporting events. . Carefully consider travel plans you have or are making. . If you are planning any travel outside or inside the [place], visit the CDC's Travelers' Health webpage for the latest health notices. . If you have some symptoms but not all symptoms, continue to monitor at home and seek medical attention if your symptoms worsen. . If you are having a medical emergency, call 911.   ADDITIONAL HEALTHCARE OPTIONS FOR PATIENTS  [facility] / e-Visit: [email or web address]         [facility]: [phone]  [facility]: [phone]                   [facility]: [phone]   

## 2019-06-05 NOTE — Progress Notes (Signed)
DIAGNOSIS: Extensive stage (T1 a, N2, M1a)extensive stage small cell lung cancer presented with right lung nodules in addition to mediastinal lymphadenopathy as well as pericardial metastasis diagnosed in October 2019  PRIOR THERAPY: None  CURRENT THERAPY: Palliative systemic chemotherapy with carboplatin for AUC of 5 on day 1, etoposide 100 mg/M2 on days 1, 2 and 3 in addition to Tecentriq (Atezolizumab) 1200 mg IV every 3 weeks. Status post 12cycles. Starting from cycle #5 the patient will be treated with maintenance immunotherapy with Tecentriq 1200 mg IV every 3 weeks.  Subjective: The patient is seen and examined today.  He is feeling much better today.  He has 2 syncopal episode and a fall at home with loss of consciousness.  He was brought to the emergency room for evaluation and CT scan of the head showed new parenchymal cavitary masses with extensive vasogenic edema consistent with multifocal intracranial metastasis with central necrosis.  This was followed by MRI of the brain today and it showed widespread metastatic disease developed since the prior MRI of October 2019.  At least 9 lesions were seen and there is a larger amount of vasogenic edema in the left frontal lobe and right parietal lobe without midline shift.  There was also solid enhancing lesion along the lateral sesamoid wing has grown in the interval and most consistent with meningioma.  The patient was started on Decadron 8 mg p.o. every 8 hours.  He has improvement in his mental status and less confusion.  He denied having any other complaints.  Objective: Vital signs in last 24 hours: Temp:  [98.2 F (36.8 C)-98.7 F (37.1 C)] 98.2 F (36.8 C) (08/03 1248) Pulse Rate:  [52-66] 54 (08/03 1347) Resp:  [8-20] 16 (08/03 1347) BP: (93-125)/(42-94) 114/94 (08/03 1347) SpO2:  [96 %-99 %] 99 % (08/03 1347) Weight:  [154 lb 12.8 oz (70.2 kg)-156 lb 8.4 oz (71 kg)] 154 lb 12.8 oz (70.2 kg) (08/03 0413)  Intake/Output from  previous day: 08/02 0701 - 08/03 0700 In: 209.2 [I.V.:209.2] Out: 400 [Urine:400] Intake/Output this shift: Total I/O In: 460 [P.O.:460] Out: 250 [Urine:250]  General appearance: alert, cooperative and no distress Resp: clear to auscultation bilaterally Cardio: regular rate and rhythm, S1, S2 normal, no murmur, click, rub or gallop GI: soft, non-tender; bowel sounds normal; no masses,  no organomegaly Extremities: extremities normal, atraumatic, no cyanosis or edema  Lab Results:  Recent Labs    06/04/19 1725 06/05/19 0546  WBC 9.8 7.2  HGB 11.9* 11.6*  HCT 36.9* 37.3*  PLT 217 236   BMET Recent Labs    06/04/19 1725 06/05/19 0546  NA 132* 134*  K 4.7 4.2  CL 95* 99  CO2 25 24  GLUCOSE 428* 220*  BUN 21 22  CREATININE 1.70* 1.35*  CALCIUM 9.5 9.1    Studies/Results: Ct Head Wo Contrast  Result Date: 06/04/2019 CLINICAL DATA:  Fall, history of small cell lung cancer EXAM: CT HEAD WITHOUT CONTRAST TECHNIQUE: Contiguous axial images were obtained from the base of the skull through the vertex without intravenous contrast. COMPARISON:  Brain MRI 08/10/2018 FINDINGS: Brain: New large cavitary lesion in the high RIGHT frontal lobe with thickened wall measures 3.9 x 3.0 cm. There is extensive vasogenic edema associated with this new large lesion. Similar lesion in the LEFT frontal lobe measuring 1.9 x 1.9 cm also with vasogenic edema. Smaller midline LEFT frontal lesion measuring 1.7 cm along the anterior interhemispheric fissure. Additional lesion in the LEFT cerebellum measuring 2.0 cm Lesion  in the head of the RIGHT caudate nucleus is high-density measuring 1.7 cm. Basal cisterns are patent.  No hydrocephalus. No acute intracranial hemorrhage.  No intraventricular hemorrhage Vascular: No hyperdense vessel or unexpected calcification. Skull: Normal. Negative for fracture or focal lesion. Sinuses/Orbits: No acute finding. Other: IMPRESSION: 1. Unfortunately, there are new  parenchymal cavitary masses with extensive vasogenic edema consistent with multifocal intracranial metastasis with central necrosis. 2. Metastatic lesions include the high RIGHT frontal lobe, anterior LEFT frontal lobe, head of the RIGHT caudate nucleus, and LEFT cerebellum. 3. No hydrocephalus. Basal cisterns patent. No intracranial hemorrhage , Findings [place] 06/04/2019  at18:16. Electronically Signed   By: Suzy Bouchard M.D.   On: 06/04/2019 18:17   Mr Jeri Cos FU Contrast  Result Date: 06/05/2019 CLINICAL DATA:  Small cell lung cancer.  Abnormal CT head EXAM: MRI HEAD WITHOUT AND WITH CONTRAST TECHNIQUE: Multiplanar, multiecho pulse sequences of the brain and surrounding structures were obtained without and with intravenous contrast. CONTRAST:  7 mL Gadovist IV COMPARISON:  CT head 06/04/2019, MRI 08/10/2018 FINDINGS: Brain: Multiple enhancing lesions the brain compatible with metastatic disease. These were noted on the recent CT from yesterday but were not present on the prior MRI of 08/09/2018. Extensive edema in the left frontal lobe and right parietal lobe without midline shift. Left cerebellar rim enhancing mass with central necrosis measures 28 mm with mild surrounding edema and mild hemorrhage. 6 x 15 mm lesion right anterior temporal lobe with mild edema 19 x 12 mm enhancing lesion right head of caudate with surrounding edema. 10 mm rim enhancing lesion left thalamus with mild edema Large necrotic lesion right anterior occipital lobe measures 30 x 42 mm with extensive vasogenic edema and slight hemorrhage. 10 mm lesion right medial parietal lobe 19 mm left anterior frontal lobe 21 x 26 mm left frontal convexity lesion with extensive edema. 16 mm right lateral parietal lesion Solid enhancing lesion along the lateral sphenoid wing on the left has enlarged since the prior study and now measures 23 x 17 mm and shows susceptibility and is calcified on CT. This is consistent with meningioma  which has grown in the interval. Coronal images demonstrate the this appears to be extra-axial. Vascular: Normal arterial flow voids Skull and upper cervical spine: No worrisome skeletal lesions. Sinuses/Orbits: Negative Other: None IMPRESSION: Widespread metastatic disease has developed since the prior MRI of 08/09/2018. At least 9 lesions are seen. There is a large amount of vasogenic edema in the left frontal lobe and right parietal lobe without midline shift. Solid enhancing lesion along the lateral sphenoid wing has grown in the interval and is most consistent with meningioma. Electronically Signed   By: Franchot Gallo M.D.   On: 06/05/2019 12:18   Dg Chest Port 1 View  Result Date: 06/04/2019 CLINICAL DATA:  Syncope.  History of lung carcinoma EXAM: PORTABLE CHEST 1 VIEW COMPARISON:  Chest CT May 25, 2019; chest radiograph September 16, 2019 FINDINGS: There are areas of ill-defined opacity in the right base, left base, and left mid lung. The apparent consolidation seen in the left mid lung on recent CT is not appreciable by radiography. There is underlying emphysematous change, better delineated on recent CT. Heart size and pulmonary vascularity are normal. No adenopathy is identified by radiography. Patient is status post coronary artery bypass grafting. There is a left atrial appendage clamp present. There is aortic atherosclerosis. No bone lesions. IMPRESSION: Areas of ill-defined opacity in the lung bases and left mid lung  regions. No consolidation or masses evident by radiography. Cardiac silhouette within normal limits. No adenopathy appreciable. Postoperative changes noted. Aortic Atherosclerosis (ICD10-I70.0) and Emphysema (ICD10-J43.9). Electronically Signed   By: Lowella Grip III M.D.   On: 06/04/2019 17:29    Medications: I have reviewed the patient's current medications.  Assessment/Plan: This is a very pleasant 78 years old white male with extensive stage small cell lung cancer  diagnosed in October 2019 status post palliative systemic chemotherapy with carboplatin, etoposide and Tecentriq followed by maintenance Tecentriq status post total of 12 cycles.  He has been tolerating his treatment well with no concerning adverse effects. Recent CT scan of the chest, abdomen pelvis showed no concerning findings for disease progression. The patient was admitted with syncopal episode, fall and loss of consciousness and CT scan of the head followed by MRI of the brain showed widely metastatic disease in the brain. I recommended for the patient to continue on his current medication with Decadron but we can reduce the dose to 4 mg p.o. every 6 hours before discharge.  He will also need close follow-up and management of his diabetes and hyperglycemia by his primary care physician after discharge. The patient was referred to Dr. Sondra Come and he is expected to start whole brain irradiation tomorrow. We will delay the next cycle of his systemic treatment with Tecentriq by 1 week to start on June 23, 2019 with follow-up visit at that time. He was advised to call immediately if he has any concerning symptoms in the interval.  Disclaimer: This note was dictated with voice recognition software. Similar sounding words can inadvertently be transcribed and may be missed upon review.   LOS: 0 days    Eilleen Kempf 06/05/2019

## 2019-06-05 NOTE — Progress Notes (Signed)
Radiation Oncology         [phone] ________________________________  Initial Inpatient Consultation  Name: Gregory Bates MRN: [id number]  Date: 06/05/2019  DOB: 1941-10-28  XB:JYNWGN, Sharon Mt, MD  Sherwood Gambler, MD   REFERRING PHYSICIAN: Sherwood Gambler, MD  DIAGNOSIS: The encounter diagnosis was Metastatic cancer to brain [facility]).  HISTORY OF PRESENT ILLNESS::Gregory Bates is a 78 y.o. male who is followed by Dr. Julien Nordmann for extensive stage (T1 a, N2, M1a) small cell lung cancer presented with right lung nodules in addition to mediastinal lymphadenopathy as well as pericardial metastasis diagnosed in October 2019. He was seen by Dr. Lisbeth Renshaw at that time for consideration of palliative radiation, but they opted against at that time due to lack of symptoms.  The patient presented to the ED on 06/04/2019 following a syncopal episode. Per ER Dr. Hal Hope, "As per patient's grandson this is a second syncopal episode in the last 24 hours.  Patient's last chemotherapy was last week.  Over the last 1 week patient has had significant decline in his function and finds it very difficult to ambulate because of weakness.  About a week ago patient was found weak in his hall at home and had incontinence of urine at that time.  24 hours ago patient was trying to walk to the bathroom when he passed out same thing happened today.  Exact number of minutes patient pattern is not clear but did have incontinence of urine after the pass out and appeared confused.  Per patient's grandson patient has been having some chest pain off and on for last few weeks."  He underwent head CT upon admission the same day. This revealed: new parenchymal cavitary masses with extensive vasogenic edema consistent with multifocal intracranial metastasis with central necrosis; metastatic lesions include the high right frontal lobe, anterior left frontal lobe, head of the right caudate nucleus, and left cerebellum; no hydrocephalus,  basal cisterns patent, no intracranial hemorrhage.  Earlier today the patient underwent MRI of the brain which confirmed at least 9 lesions, largest measuring approximately 4 cm.  There was significant vasogenic edema noted.  Patient was placed on Decadron upon admission which is helped his confusion.  Patient reports being weak in his left lower extremity.    PREVIOUS RADIATION THERAPY: No  PAST MEDICAL HISTORY:  Past Medical History:  Diagnosis Date   Acute renal insufficiency    a. During 11/2013 adm with atrial flutter.   Anemia, unspecified    Atrial fibrillation (HCC)    CAD (coronary artery disease), native coronary artery    a. s/p cath May 2011 >> DES distal RCA;  b. 01/2013 NSTEMI >> OM1 99p (2.75x12 Promus Premier DES);  c. LHC (2/15): PCI: Promus DES to dist RCA ;  d. LHC 1/16 - dLM 65, oLAD 30, small D1 tandem 53, pOM1 stent ok, mOM 78-60, dOM bifurcation 50-60 in both vessels, AV 95-70 jailed by stent, pRCA 73 mRCA stent ok, dRCA stent ok, PLB smal 50, dPDA 60, 80; EF 35-40 >> med Rx - PCI of PDA if angina   Chronic combined systolic and diastolic CHF (congestive heart failure) ([place]) 12/23/2015   Echo 2/17: Mild LVH, EF 45-50%, inferior akinesis, grade 2 diastolic dysfunction, mild AI, mild MR, mild LAE, PASP 44 mmHg    Colorectal polyps 2002   Diabetes mellitus    AODM   Erectile dysfunction    History of echocardiogram    a. 01/2013 Echo: EF 55%, mid-dist inflat HK, Gr1 DD,  Triv AI/TR, Mild MR.;  b.  Echo (1/15): Mild LVH, EF 55%, inferolateral hypokinesis, grade 1 diastolic dysfunction, mild AI, trivial MR, moderate LAE, PASP 36 mmHg    Hx of cardiovascular stress test    Stress myoview 08/07/13 with LVEF 44%, inferior scar, no ischemia   Hyperlipidemia    Hypertension    Hyponatremia    Ischemic cardiomyopathy    a. EF 35-40% by LHC in 1/16; b. Echo 2/17: Mild LVH, EF 45-50%, inferior akinesis, grade 2 diastolic dysfunction, mild AI, mild MR, mild LAE, PASP  44 mmHg   Myocardial infarction (HCC)    Oxygen deficiency    PAF (paroxysmal atrial fibrillation) ([place])    a. Dx 11/2013. Spont conv. Placed on eliquis.   Small cell lung cancer in adult [facility]) 07/28/2018    PAST SURGICAL HISTORY: Past Surgical History:  Procedure Laterality Date   BIOPSY  08/01/2018   Procedure: BIOPSY OF PERICARDIAL LYMPH NODE AND HILAR LYMPH NODE;  Surgeon: Grace Isaac, MD;  Location: [place];  Service: Open Heart Surgery;;   CLIPPING OF ATRIAL APPENDAGE Left 08/01/2018   Procedure: CLIPPING OF ATRIAL APPENDAGE;  Surgeon: Grace Isaac, MD;  Location: [place];  Service: Open Heart Surgery;  Laterality: Left;   COLONOSCOPY W/ POLYPECTOMY  2002    GI   COLONOSCOPY W/ POLYPECTOMY  2004; 2007 negative   CORONARY ANGIOPLASTY  12/26/2013   CORONARY ARTERY BYPASS GRAFT N/A 08/01/2018   Procedure: CORONARY ARTERY BYPASS GRAFTING (CABG) TIMES THREE USING LEFT INTERNAL MAMMARY ARTERY TO LAD, RIGHT GREATER SAPHENOUS VEIN GRAFT TO OM AND RCA, VEIN HARVESTED ENDOSCOPICALLY;  Surgeon: Grace Isaac, MD;  Location: [place];  Service: Open Heart Surgery;  Laterality: N/A;   CORONARY STENT PLACEMENT  2007, 2011   infantile paralysis facial asymmetry     LEFT HEART CATH AND CORONARY ANGIOGRAPHY N/A 05/18/2017   Procedure: Left Heart Cath and Coronary Angiography;  Surgeon: Martinique, Peter M, MD;  Location: [place] CV LAB;  Service: Cardiovascular;  Laterality: N/A;   LEFT HEART CATH AND CORONARY ANGIOGRAPHY N/A 07/27/2018   Procedure: LEFT HEART CATH AND CORONARY ANGIOGRAPHY;  Surgeon: Martinique, Peter M, MD;  Location: [place] CV LAB;  Service: Cardiovascular;  Laterality: N/A;   LEFT HEART CATHETERIZATION WITH CORONARY ANGIOGRAM N/A 10/02/2012   Procedure: LEFT HEART CATHETERIZATION WITH CORONARY ANGIOGRAM;  Surgeon: Peter M Martinique, MD;  Location: [facility] CATH LAB;  Service: Cardiovascular;  Laterality: N/A;   LEFT HEART CATHETERIZATION WITH CORONARY ANGIOGRAM  N/A 02/20/2013   Procedure: LEFT HEART CATHETERIZATION WITH CORONARY ANGIOGRAM;  Surgeon: Burnell Blanks, MD;  Location: [facility] CATH LAB;  Service: Cardiovascular;  Laterality: N/A;   LEFT HEART CATHETERIZATION WITH CORONARY ANGIOGRAM N/A 12/26/2013   Procedure: LEFT HEART CATHETERIZATION WITH CORONARY ANGIOGRAM;  Surgeon: Burnell Blanks, MD;  Location: [facility] CATH LAB;  Service: Cardiovascular;  Laterality: N/A;   LEFT HEART CATHETERIZATION WITH CORONARY ANGIOGRAM N/A 11/28/2014   Procedure: LEFT HEART CATHETERIZATION WITH CORONARY ANGIOGRAM;  Surgeon: Burnell Blanks, MD;  Location: [facility] CATH LAB;  Service: Cardiovascular;  Laterality: N/A;   PERCUTANEOUS CORONARY STENT INTERVENTION (PCI-S)  10/02/2012   Procedure: PERCUTANEOUS CORONARY STENT INTERVENTION (PCI-S);  Surgeon: Peter M Martinique, MD;  Location: [facility] CATH LAB;  Service: Cardiovascular;;   PERCUTANEOUS CORONARY STENT INTERVENTION (PCI-S)  02/20/2013   Procedure: PERCUTANEOUS CORONARY STENT INTERVENTION (PCI-S);  Surgeon: Burnell Blanks, MD;  Location: [facility] CATH LAB;  Service: Cardiovascular;;   PERCUTANEOUS CORONARY STENT INTERVENTION (PCI-S)  12/26/2013   Procedure: PERCUTANEOUS CORONARY STENT INTERVENTION (PCI-S);  Surgeon: Burnell Blanks, MD;  Location: [facility] CATH LAB;  Service: Cardiovascular;;   TEE WITHOUT CARDIOVERSION N/A 08/01/2018   Procedure: TRANSESOPHAGEAL ECHOCARDIOGRAM (TEE);  Surgeon: Grace Isaac, MD;  Location: [place];  Service: Open Heart Surgery;  Laterality: N/A;   WISDOM TOOTH EXTRACTION      FAMILY HISTORY:  Family History  Problem Relation Age of Onset   Heart attack Brother 39   Colon cancer Brother    Prostate cancer Brother    Breast cancer Sister    Stroke Sister    Diabetes Neg Hx    Hypertension Neg Hx     SOCIAL HISTORY:  Social History   Tobacco Use   Smoking status: Former Smoker    Packs/day: 2.00    Years: 30.00    Pack years: 60.00    Types:  Cigarettes    Quit date: 11/02/1986    Years since quitting: 32.6   Smokeless tobacco: Never Used   Tobacco comment: smoked [place], up to 3 ppd (!)  Substance Use Topics   Alcohol use: No   Drug use: No    ALLERGIES:  Allergies  Allergen Reactions   Brilinta [Ticagrelor] Other (See Comments)    Arthralgias & myalgias   Crestor [Rosuvastatin] Other (See Comments)    Myalgias    MEDICATIONS:  No current facility-administered medications for this encounter.    No current outpatient medications on file.   Facility-Administered Medications Ordered in Other Encounters  Medication Dose Route Frequency Provider Last Rate Last Dose   0.9 %  sodium chloride infusion   Intravenous Continuous Dessa Phi, DO 75 mL/hr at 06/05/19 1553     acetaminophen (TYLENOL) tablet 650 mg  650 mg Oral Q6H PRN Rise Patience, MD       Or   acetaminophen (TYLENOL) suppository 650 mg  650 mg Rectal Q6H PRN Rise Patience, MD       albuterol (PROVENTIL) (2.5 MG/3ML) 0.083% nebulizer solution 2.5 mg  2.5 mg Nebulization Q6H PRN Rise Patience, MD       amiodarone (PACERONE) tablet 200 mg  200 mg Oral Daily Rise Patience, MD   200 mg at 06/05/19 1054   apixaban (ELIQUIS) tablet 5 mg  5 mg Oral BID Rise Patience, MD   5 mg at 06/05/19 1054   aspirin EC tablet 81 mg  81 mg Oral Daily Rise Patience, MD   81 mg at 06/05/19 1055   atorvastatin (LIPITOR) tablet 20 mg  20 mg Oral q1800 Rise Patience, MD       cholecalciferol (VITAMIN D3) tablet 1,000 Units  1,000 Units Oral QPM Rise Patience, MD       clopidogrel (PLAVIX) tablet 75 mg  75 mg Oral Daily Rise Patience, MD   75 mg at 06/05/19 1055   dexamethasone (DECADRON) tablet 8 mg  8 mg Oral Q8H Rise Patience, MD   8 mg at 06/05/19 1359   feeding supplement (ENSURE ENLIVE) (ENSURE ENLIVE) liquid 237 mL  237 mL Oral BID BM Rise Patience, MD   237 mL at 06/05/19 1349    insulin aspart (novoLOG) injection 0-9 Units  0-9 Units Subcutaneous Q4H Rise Patience, MD   7 Units at 06/05/19 1348   insulin glargine (LANTUS) injection 20 Units  20 Units Subcutaneous QHS Rise Patience, MD   20 Units at 06/04/19 2318  metoprolol tartrate (LOPRESSOR) tablet 25 mg  25 mg Oral BID Rise Patience, MD   25 mg at 06/05/19 1055   multivitamin with minerals tablet 1 tablet  1 tablet Oral QHS Rise Patience, MD   1 tablet at 06/05/19 0003   nitroGLYCERIN (NITROSTAT) SL tablet 0.4 mg  0.4 mg Sublingual Q5 min PRN Rise Patience, MD       ondansetron [facility]) tablet 4 mg  4 mg Oral Q6H PRN Rise Patience, MD       Or   ondansetron [facility]) injection 4 mg  4 mg Intravenous Q6H PRN Rise Patience, MD       vitamin E capsule 100 Units  100 Units Oral QHS Rise Patience, MD   100 Units at 06/05/19 0002    REVIEW OF SYSTEMS:  A 10+ POINT REVIEW OF SYSTEMS WAS OBTAINED including neurology, dermatology, psychiatry, cardiac, respiratory, lymph, extremities, GI, GU, musculoskeletal, constitutional, reproductive, HEENT.    PHYSICAL EXAM:  height is 5\' 11"  (1.803 m). His oral temperature is 98.2 F (36.8 C). His blood pressure is 118/48 (abnormal) and his pulse is 59 (abnormal). His respiration is 18 and oxygen saturation is 99%.   General: Alert and oriented, in no acute distress, sitting comfortably in his hospital wheelchair.  The patient shows episodes of confusion HEENT: Head is normocephalic. Extraocular movements are intact. Oropharynx is clear. Neck: Neck is supple, no palpable cervical or supraclavicular lymphadenopathy. Heart: Regular in rate and rhythm with no murmurs, rubs, or gallops. Chest: Clear to auscultation bilaterally, with no rhonchi, wheezes, or rales. Abdomen: Soft, nontender, nondistended, with no rigidity or guarding. Extremities: No cyanosis or edema. Lymphatics: see Neck Exam Skin: No concerning  lesions. Musculoskeletal: symmetric strength and muscle tone throughout. Neurologic: Cranial nerves II through XII are grossly intact. No obvious focalities. Speech is fluent. Coordination is intact.  Patient was noted to have some weakness in the proximal muscle groups of his left lower extremity Psychiatric: Judgment and insight are intact. Affect is appropriate.   ECOG = 3  0 - Asymptomatic (Fully active, able to carry on all predisease activities without restriction)  1 - Symptomatic but completely ambulatory (Restricted in physically strenuous activity but ambulatory and able to carry out work of a light or sedentary nature. For example, light housework, office work)  2 - Symptomatic, <50% in bed during the day (Ambulatory and capable of all self care but unable to carry out any work activities. Up and about more than 50% of waking hours)  3 - Symptomatic, >50% in bed, but not bedbound (Capable of only limited self-care, confined to bed or chair 50% or more of waking hours)  4 - Bedbound (Completely disabled. Cannot carry on any self-care. Totally confined to bed or chair)  5 - Death   Eustace Pen MM, Creech RH, Tormey DC, et al. [phone]). "Toxicity and response criteria of the [facility]". [place] Oncol. 5 (6): 649-55  LABORATORY DATA:  Lab Results  Component Value Date   WBC 7.2 06/05/2019   HGB 11.6 (L) 06/05/2019   HCT 37.3 (L) 06/05/2019   MCV 90.5 06/05/2019   PLT 236 06/05/2019   NEUTROABS 6.3 06/05/2019   Lab Results  Component Value Date   NA 134 (L) 06/05/2019   K 4.2 06/05/2019   CL 99 06/05/2019   CO2 24 06/05/2019   GLUCOSE 220 (H) 06/05/2019   CREATININE 1.35 (H) 06/05/2019   CALCIUM 9.1 06/05/2019  RADIOGRAPHY: Ct Head Wo Contrast  Result Date: 06/04/2019 CLINICAL DATA:  Fall, history of small cell lung cancer EXAM: CT HEAD WITHOUT CONTRAST TECHNIQUE: Contiguous axial images were obtained from the base of the skull through the  vertex without intravenous contrast. COMPARISON:  Brain MRI 08/10/2018 FINDINGS: Brain: New large cavitary lesion in the high RIGHT frontal lobe with thickened wall measures 3.9 x 3.0 cm. There is extensive vasogenic edema associated with this new large lesion. Similar lesion in the LEFT frontal lobe measuring 1.9 x 1.9 cm also with vasogenic edema. Smaller midline LEFT frontal lesion measuring 1.7 cm along the anterior interhemispheric fissure. Additional lesion in the LEFT cerebellum measuring 2.0 cm Lesion in the head of the RIGHT caudate nucleus is high-density measuring 1.7 cm. Basal cisterns are patent.  No hydrocephalus. No acute intracranial hemorrhage.  No intraventricular hemorrhage Vascular: No hyperdense vessel or unexpected calcification. Skull: Normal. Negative for fracture or focal lesion. Sinuses/Orbits: No acute finding. Other: IMPRESSION: 1. Unfortunately, there are new parenchymal cavitary masses with extensive vasogenic edema consistent with multifocal intracranial metastasis with central necrosis. 2. Metastatic lesions include the high RIGHT frontal lobe, anterior LEFT frontal lobe, head of the RIGHT caudate nucleus, and LEFT cerebellum. 3. No hydrocephalus. Basal cisterns patent. No intracranial hemorrhage , Findings conveyed to[place]stonon 06/04/2019  at18:16. Electronically Signed   By: Suzy Bouchard M.D.   On: 06/04/2019 18:17   Ct Chest Wo Contrast  Result Date: 05/25/2019 CLINICAL DATA:  Small-cell lung cancer.  Restaging. EXAM: CT CHEST WITHOUT CONTRAST TECHNIQUE: Multidetector CT imaging of the chest was performed following the standard protocol without IV contrast. COMPARISON:  04/14/2019 FINDINGS: Cardiovascular: The heart size is normal. No substantial pericardial effusion. Status post CABG. Atherosclerotic calcification is noted in the wall of the thoracic aorta. Mediastinum/Nodes: Scattered small mediastinal lymph nodes are stable. No evidence for gross hilar lymphadenopathy  although assessment is limited by the lack of intravenous contrast on today's study. The esophagus has normal imaging features. There is no axillary lymphadenopathy. Lungs/Pleura: The central tracheobronchial airways are patent. Centrilobular and paraseptal emphysema evident. Consolidative opacity in the anterior right upper lobe is similar to prior. The consolidative patchy opacity seen previously in the lower lobes bilaterally has decreased and become less confluent in the interval with areas of ground-glass attenuation now seen more more dense consolidative change was evident previously. Interval development of new patchy consolidative and ground-glass opacity noted in the posterior left upper lobe. No pleural effusion. Upper Abdomen: Unremarkable. Musculoskeletal: No worrisome lytic or sclerotic osseous abnormality. IMPRESSION: 1. Overall generalized trend towards improvement of the patchy bilateral consolidative airspace opacity seen on the previous study with some new disease noted in the posterior left upper lobe on the current exam. Imaging features may reflect a waxing/waning course of infectious/inflammatory etiology with drug toxicity not excluded. Recurrent disease considered less likely but not excluded. 2.  Aortic Atherosclerois (ICD10-170.0) 3.  Emphysema. ([id number]-M22.9) Electronically Signed   By: Misty Stanley M.D.   On: 05/25/2019 16:44   Mr Jeri Cos QJ Contrast  Result Date: 06/05/2019 CLINICAL DATA:  Small cell lung cancer.  Abnormal CT head EXAM: MRI HEAD WITHOUT AND WITH CONTRAST TECHNIQUE: Multiplanar, multiecho pulse sequences of the brain and surrounding structures were obtained without and with intravenous contrast. CONTRAST:  7 mL Gadovist IV COMPARISON:  CT head 06/04/2019, MRI 08/10/2018 FINDINGS: Brain: Multiple enhancing lesions the brain compatible with metastatic disease. These were noted on the recent CT from yesterday but were not present on the  prior MRI of 08/09/2018. Extensive  edema in the left frontal lobe and right parietal lobe without midline shift. Left cerebellar rim enhancing mass with central necrosis measures 28 mm with mild surrounding edema and mild hemorrhage. 6 x 15 mm lesion right anterior temporal lobe with mild edema 19 x 12 mm enhancing lesion right head of caudate with surrounding edema. 10 mm rim enhancing lesion left thalamus with mild edema Large necrotic lesion right anterior occipital lobe measures 30 x 42 mm with extensive vasogenic edema and slight hemorrhage. 10 mm lesion right medial parietal lobe 19 mm left anterior frontal lobe 21 x 26 mm left frontal convexity lesion with extensive edema. 16 mm right lateral parietal lesion Solid enhancing lesion along the lateral sphenoid wing on the left has enlarged since the prior study and now measures 23 x 17 mm and shows susceptibility and is calcified on CT. This is consistent with meningioma which has grown in the interval. Coronal images demonstrate the this appears to be extra-axial. Vascular: Normal arterial flow voids Skull and upper cervical spine: No worrisome skeletal lesions. Sinuses/Orbits: Negative Other: None IMPRESSION: Widespread metastatic disease has developed since the prior MRI of 08/09/2018. At least 9 lesions are seen. There is a large amount of vasogenic edema in the left frontal lobe and right parietal lobe without midline shift. Solid enhancing lesion along the lateral sphenoid wing has grown in the interval and is most consistent with meningioma. Electronically Signed   By: Franchot Gallo M.D.   On: 06/05/2019 12:18   Dg Chest Port 1 View  Result Date: 06/04/2019 CLINICAL DATA:  Syncope.  History of lung carcinoma EXAM: PORTABLE CHEST 1 VIEW COMPARISON:  Chest CT May 25, 2019; chest radiograph September 16, 2019 FINDINGS: There are areas of ill-defined opacity in the right base, left base, and left mid lung. The apparent consolidation seen in the left mid lung on recent CT is not appreciable  by radiography. There is underlying emphysematous change, better delineated on recent CT. Heart size and pulmonary vascularity are normal. No adenopathy is identified by radiography. Patient is status post coronary artery bypass grafting. There is a left atrial appendage clamp present. There is aortic atherosclerosis. No bone lesions. IMPRESSION: Areas of ill-defined opacity in the lung bases and left mid lung regions. No consolidation or masses evident by radiography. Cardiac silhouette within normal limits. No adenopathy appreciable. Postoperative changes noted. Aortic Atherosclerosis (ICD10-I70.0) and Emphysema (ICD10-J43.9). Electronically Signed   By: Lowella Grip III M.D.   On: 06/04/2019 17:29      IMPRESSION: Extensive stage (T1 a, N2, M1a) small cell lung cancer, now with brain metastases.  Patient will be a good candidate for urgent radiation therapy directed at the whole brain.  Expect good response to his treatment in light of his diagnosis of small cell lung cancer.  Patient however does have significant involvement within the brain.  He has already improved significantly with his Decadron prescription.  Today, I talked to the patient  about the findings and work-up thus far.  We discussed the natural history of metastatic lung cancer and general treatment, highlighting the role of radiotherapy in the management.  We discussed the available radiation techniques, and focused on the details of logistics and delivery.  We reviewed the anticipated acute and late sequelae associated with radiation in this setting.  The patient was encouraged to ask questions that I answered to the best of my ability.  A patient consent form was discussed and signed.  We retained a copy for our records.  The patient would like to proceed with radiation and will be scheduled for CT simulation.  PLAN: Patient will proceed with planning today and receive his first radiation therapy directed to the whole brain  tomorrow.  He will receive 10 whole brain treatments.. Dosing recommendations for the patient's Decadron have been placed by Dr. Julien Nordmann.  Once the patient is medically stable and will be discharged continue his whole brain radiation therapy as an outpatient ------------------------------------------------  Blair Promise, PhD, MD  This document serves as a record of services personally performed by [name], MD. It was created on his behalf by Wilburn Mylar, a trained medical scribe. The creation of this record is based on the scribe's personal observations and the provider's statements to them. This document has been checked and approved by the attending provider.

## 2019-06-06 ENCOUNTER — Other Ambulatory Visit: Payer: Self-pay

## 2019-06-06 ENCOUNTER — Ambulatory Visit: Admit: 2019-06-06 | Payer: Medicare HMO | Admitting: Radiation Oncology

## 2019-06-06 ENCOUNTER — Telehealth: Payer: Self-pay | Admitting: Medical Oncology

## 2019-06-06 ENCOUNTER — Telehealth: Payer: Self-pay | Admitting: Internal Medicine

## 2019-06-06 DIAGNOSIS — C3490 Malignant neoplasm of unspecified part of unspecified bronchus or lung: Secondary | ICD-10-CM | POA: Diagnosis not present

## 2019-06-06 DIAGNOSIS — C7951 Secondary malignant neoplasm of bone: Secondary | ICD-10-CM | POA: Diagnosis not present

## 2019-06-06 DIAGNOSIS — I10 Essential (primary) hypertension: Secondary | ICD-10-CM | POA: Diagnosis not present

## 2019-06-06 DIAGNOSIS — I252 Old myocardial infarction: Secondary | ICD-10-CM | POA: Diagnosis not present

## 2019-06-06 DIAGNOSIS — Z5112 Encounter for antineoplastic immunotherapy: Secondary | ICD-10-CM | POA: Diagnosis not present

## 2019-06-06 DIAGNOSIS — E785 Hyperlipidemia, unspecified: Secondary | ICD-10-CM | POA: Diagnosis not present

## 2019-06-06 DIAGNOSIS — J439 Emphysema, unspecified: Secondary | ICD-10-CM | POA: Diagnosis not present

## 2019-06-06 DIAGNOSIS — E119 Type 2 diabetes mellitus without complications: Secondary | ICD-10-CM | POA: Diagnosis not present

## 2019-06-06 DIAGNOSIS — R0789 Other chest pain: Secondary | ICD-10-CM

## 2019-06-06 DIAGNOSIS — I480 Paroxysmal atrial fibrillation: Secondary | ICD-10-CM | POA: Diagnosis not present

## 2019-06-06 LAB — BASIC METABOLIC PANEL
Anion gap: 8 (ref 5–15)
BUN: 25 mg/dL — ABNORMAL HIGH (ref 8–23)
CO2: 24 mmol/L (ref 22–32)
Calcium: 9 mg/dL (ref 8.9–10.3)
Chloride: 102 mmol/L (ref 98–111)
Creatinine, Ser: 1.12 mg/dL (ref 0.61–1.24)
GFR calc Af Amer: >60 mL/min (ref >60)
GFR calc non Af Amer: >60 mL/min (ref >60)
Glucose, Bld: 180 mg/dL — ABNORMAL HIGH (ref 70–99)
Potassium: 4.4 mmol/L (ref 3.5–5.1)
Sodium: 134 mmol/L — ABNORMAL LOW (ref 135–145)

## 2019-06-06 LAB — GLUCOSE, CAPILLARY
Glucose-Capillary: 120 mg/dL — ABNORMAL HIGH (ref 70–99)
Glucose-Capillary: 164 mg/dL — ABNORMAL HIGH (ref 70–99)
Glucose-Capillary: 166 mg/dL — ABNORMAL HIGH (ref 70–99)
Glucose-Capillary: 281 mg/dL — ABNORMAL HIGH (ref 70–99)
Glucose-Capillary: 294 mg/dL — ABNORMAL HIGH (ref 70–99)
Glucose-Capillary: 419 mg/dL — ABNORMAL HIGH (ref 70–99)

## 2019-06-06 LAB — CBC
HCT: 32.6 % — ABNORMAL LOW (ref 39.0–52.0)
Hemoglobin: 10.5 g/dL — ABNORMAL LOW (ref 13.0–17.0)
MCH: 29 pg (ref 26.0–34.0)
MCHC: 32.2 g/dL (ref 30.0–36.0)
MCV: 90.1 fL (ref 80.0–100.0)
Platelets: 200 10*3/uL (ref 150–400)
RBC: 3.62 MIL/uL — ABNORMAL LOW (ref 4.22–5.81)
RDW: 14.9 % (ref 11.5–15.5)
WBC: 11.6 10*3/uL — ABNORMAL HIGH (ref 4.0–10.5)
nRBC: 0 % (ref 0.0–0.2)

## 2019-06-06 LAB — TROPONIN I (HIGH SENSITIVITY)
Troponin I (High Sensitivity): 12 ng/L (ref <18)
Troponin I (High Sensitivity): 20 ng/L — ABNORMAL HIGH (ref <18)

## 2019-06-06 MED ORDER — INSULIN GLARGINE 100 UNIT/ML ~~LOC~~ SOLN
25.0000 [IU] | Freq: Every day | SUBCUTANEOUS | Status: DC
  Administered 2019-06-06: 25 [IU] via SUBCUTANEOUS
  Filled 2019-06-06 (×2): qty 0.25

## 2019-06-06 MED ORDER — INSULIN ASPART 100 UNIT/ML ~~LOC~~ SOLN
5.0000 [IU] | Freq: Three times a day (TID) | SUBCUTANEOUS | Status: DC
  Administered 2019-06-06 – 2019-06-07 (×3): 5 [IU] via SUBCUTANEOUS

## 2019-06-06 MED ORDER — PRO-STAT SUGAR FREE PO LIQD
30.0000 mL | Freq: Two times a day (BID) | ORAL | Status: DC
  Administered 2019-06-06 – 2019-06-07 (×3): 30 mL via ORAL
  Filled 2019-06-06 (×3): qty 30

## 2019-06-06 MED ORDER — ISOSORBIDE MONONITRATE ER 60 MG PO TB24
60.0000 mg | ORAL_TABLET | Freq: Every day | ORAL | Status: DC
  Administered 2019-06-06 – 2019-06-08 (×3): 60 mg via ORAL
  Filled 2019-06-06 (×3): qty 1

## 2019-06-06 MED ORDER — ENSURE ENLIVE PO LIQD
237.0000 mL | Freq: Two times a day (BID) | ORAL | Status: DC
  Administered 2019-06-07: 237 mL via ORAL

## 2019-06-06 MED ORDER — INSULIN ASPART 100 UNIT/ML ~~LOC~~ SOLN
15.0000 [IU] | Freq: Once | SUBCUTANEOUS | Status: AC
  Administered 2019-06-06: 15 [IU] via SUBCUTANEOUS

## 2019-06-06 NOTE — Evaluation (Signed)
Physical Therapy Evaluation Patient Details Name: Gregory Bates MRN: [id number] DOB: 03-26-1941 Today's Date: 06/06/2019   History of Present Illness  78 y.o. male with history of extensive stage small cell lung cancer on palliative chemotherapy with history of CAD status post CABG last year, chronic diastolic CHF last EF measured in October 2019 was 49 to 50% with grade 2 diastolic dysfunction, chronic and disease stage III, diabetes mellitus type 2, anemia, paroxysmal atrial fibrillation was brought to the ER after patient had a syncopal episode.  Pt found to have Metastatic brain lesion with vasogenic edema  Clinical Impression  Pt admitted with above diagnosis. Pt currently with functional limitations due to the deficits listed below (see PT Problem List).  Pt will benefit from skilled PT to increase their independence and safety with mobility to allow discharge to the venue listed below.   Pt pleasant and agreeable to mobilize.  Pt assisted with ambulating in hallway and only requiring assist for maneuvering RW due to slight veering to right side.  Pt reports extremities feel equal in strength.  Recommend 24/7 assist/supervision for safety upon d/c home.       Follow Up Recommendations Home health PT;Supervision/Assistance - 24 hour    Equipment Recommendations  None recommended by PT    Recommendations for Other Services       Precautions / Restrictions Precautions Precautions: Fall      Mobility  Bed Mobility               General bed mobility comments: pt up in recliner on arrival  Transfers Overall transfer level: Needs assistance Equipment used: Rolling walker (2 wheeled) Transfers: Sit to/from Stand Sit to Stand: Min guard         General transfer comment: min/guard for safety, cues for hand placement  Ambulation/Gait Ambulation/Gait assistance: Min assist Gait Distance (Feet): 200 Feet Assistive device: Rolling walker (2 wheeled) Gait Pattern/deviations:  Step-through pattern;Decreased stride length Gait velocity: decr   General Gait Details: verbal cues for ambulating straight as pt veering to right side, assist for maneuvering RW, small short uncoordinated steps  Stairs            Wheelchair Mobility    Modified Rankin (Stroke Patients Only)       Balance Overall balance assessment: Mild deficits observed, not formally tested;Needs assistance         Standing balance support: Bilateral upper extremity supported Standing balance-Leahy Scale: Poor                               Pertinent Vitals/Pain Pain Assessment: No/denies pain    Home Living Family/patient expects to be discharged to:: Private residence Living Arrangements: Spouse/significant other Available Help at Discharge: Family;Available 24 hours/day Type of Home: House Home Access: Stairs to enter Entrance Stairs-Rails: Psychiatric nurse of Steps: 3 Home Layout: Multi-level;Able to live on main level with bedroom/bathroom Home Equipment: Gilford Rile - 2 wheels;Walker - 4 wheels;Shower seat - built in      Prior Function Level of Independence: Independent with assistive device(s)               Hand Dominance        Extremity/Trunk Assessment        Lower Extremity Assessment Lower Extremity Assessment: Generalized weakness       Communication   Communication: No difficulties  Cognition Arousal/Alertness: Awake/alert Behavior During Therapy: WFL for tasks assessed/performed Overall Cognitive Status:  No family/caregiver present to determine baseline cognitive functioning Area of Impairment: Safety/judgement                         Safety/Judgement: Decreased awareness of safety;Decreased awareness of deficits            General Comments      Exercises     Assessment/Plan    PT Assessment Patient needs continued PT services  PT Problem List Decreased strength;Decreased mobility;Decreased  balance;Decreased knowledge of use of DME;Decreased safety awareness       PT Treatment Interventions DME instruction;Therapeutic exercise;Gait training;Balance training;Stair training;Functional mobility training;Neuromuscular re-education;Therapeutic activities    PT Goals (Current goals can be found in the Care Plan section)  Acute Rehab PT Goals PT Goal Formulation: With patient Time For Goal Achievement: 06/20/19 Potential to Achieve Goals: Good    Frequency Min 3X/week   Barriers to discharge        Co-evaluation               AM-PAC PT "6 Clicks" Mobility  Outcome Measure Help needed turning from your back to your side while in a flat bed without using bedrails?: A Little Help needed moving from lying on your back to sitting on the side of a flat bed without using bedrails?: A Little Help needed moving to and from a bed to a chair (including a wheelchair)?: A Little Help needed standing up from a chair using your arms (e.g., wheelchair or bedside chair)?: A Little Help needed to walk in hospital room?: A Little Help needed climbing 3-5 steps with a railing? : A Little 6 Click Score: 18    End of Session Equipment Utilized During Treatment: Gait belt Activity Tolerance: Patient tolerated treatment well Patient left: in chair;with call bell/phone within reach;with chair alarm set   PT Visit Diagnosis: Other abnormalities of gait and mobility (R26.89);Unsteadiness on feet (R26.81)    Time: 3729-0211 PT Time Calculation (min) (ACUTE ONLY): 13 min   Charges:   PT Evaluation $PT Eval Low Complexity: [place], PT, DPT Acute Rehabilitation Services Office: [phone] Pager: [phone]  Trena Platt 06/06/2019, 12:53 PM

## 2019-06-06 NOTE — Progress Notes (Signed)
Pt complains of intermittent left side chest pain 6/10. BP 146/67, HR 72, Resp 20. EKG performed. Dr Maylene Roes notified. Will continue to monitor.

## 2019-06-06 NOTE — Progress Notes (Addendum)
Initial Nutrition Assessment  RD working remotely.   DOCUMENTATION CODES:   (unable to assess for malnutrition at this time.)  INTERVENTION:  - continue Ensure Enlive po BID, each supplement provides 350 kcal and 20 grams of protein - will order 30 ml prostat BID, each supplement provides 100 kcal and 15 grams protein. - continue to encourage PO intakes.    NUTRITION DIAGNOSIS:   Increased nutrient needs related to chronic illness, cancer and cancer related treatments as evidenced by estimated needs.  GOAL:   Patient will meet greater than or equal to 90% of their needs  MONITOR:   PO intake, Supplement acceptance, Labs, Weight trends  REASON FOR ASSESSMENT:   Malnutrition Screening Tool  ASSESSMENT:   78 y.o. male with history of extensive stage small cell lung cancer on palliative chemotherapy (last was last week), CAD s/p CABG last year, CHF, CKD stage 3, type 2 DM, anemia, and afib. He presented to the ED d/t syncopal episode x2 in 24 hours. Over the last 1 week patient has had significant decline in his function and finds it very difficult to ambulate because of weakness. His grandson reported patient complained of intermittent chest pain for the past few weeks. He was treated with antibiotics for colitis in June. CT head showed metastatic lesions with edema.  Per RN flow sheet, he consumed 60% of breakfast and 25% of lunch yesterday. Per review of order, patient was offered 1 bottle of Ensure, which he accepted, and 1 bottle was not offered d/t hyperglycemia. Okay to offer Ensure despite hyperglycemia given intakes, weight loss, metastatic cancer with increased nutrient needs.   Notes indicate that patient has denied N/V/D despite dx of colitis with need for antibiotics in June.  Per chart review, current weight is 155 lb and weight on 04/17/19 was 170 lb. This indicates 15 lb weight loss (8.8% body weight) in the past 1.5 months; significant for time frame. Suspect some  degree of malnutrition but unable to confirm severity without performing NFPE.   Per notes: - new metastatic brain lesion with vasogenic edema, small cell lung cancer--MRI brain pending - falls thought to be 2/2 dehydration and weakness - type 2 DM with HgbA1c of 10.8%   Labs reviewed; CBGs: 120, 164, and 166 mg/dl today, Na: 134 mmol/l, BUN: 25 mg/dl. Medications reviewed; 1000 units vitamin D3/day, sliding scale novolog, 11 units novolog x1 dose 8/3, 20 units lantus/day, daily multivitamin with minerals, 100 units vitamin E/day.  IVF; NS @ 75 ml/hr.     NUTRITION - FOCUSED PHYSICAL EXAM:  unable to complete at this time.   Diet Order:   Diet Order            Diet heart healthy/carb modified Room service appropriate? Yes; Fluid consistency: Thin  Diet effective now              EDUCATION NEEDS:   No education needs have been identified at this time  Skin:  Skin Assessment: Reviewed RN Assessment  Last BM:  8/2  Height:   Ht Readings from Last 1 Encounters:  06/04/19 5\' 11"  (1.803 m)    Weight:   Wt Readings from Last 1 Encounters:  06/06/19 70.4 kg    Ideal Body Weight:  78.2 kg  BMI:  Body mass index is 21.65 kg/m.  Estimated Nutritional Needs:   Kcal:  2115-2460 kcal  Protein:  105-120 grams  Fluid:  >/= 2.2 L/day     Jarome Matin, MS, RD, LDN, CNSC Inpatient  Clinical Dietitian Pager # [phone] After hours/weekend pager # [phone]

## 2019-06-06 NOTE — Evaluation (Signed)
Occupational Therapy Evaluation Patient Details Name: Gregory Bates MRN: [id number] DOB: 03-10-1941 Today's Date: 06/06/2019    History of Present Illness 78 y.o. male with history of extensive stage small cell lung cancer on palliative chemotherapy with history of CAD status post CABG last year, chronic diastolic CHF last EF measured in October 2019 was 52 to 50% with grade 2 diastolic dysfunction, chronic and disease stage III, diabetes mellitus type 2, anemia, paroxysmal atrial fibrillation was brought to the ER after patient had a syncopal episode.  Pt found to have Metastatic brain lesion with vasogenic edema   Clinical Impression   Pt admitted with syncopal episode. Pt currently with functional limitations due to the deficits listed below (see OT Problem List).  Pt will benefit from skilled OT to increase their safety and independence with ADL and functional mobility for ADL to facilitate discharge to venue listed below.      Follow Up Recommendations  Home health OT;Supervision/Assistance - 24 hour    Equipment Recommendations  3 in 1 bedside commode       Precautions / Restrictions Precautions Precautions: Fall      Mobility Bed Mobility Overal bed mobility: Needs Assistance Bed Mobility: Supine to Sit     Supine to sit: Min assist        Transfers Overall transfer level: Needs assistance Equipment used: Rolling walker (2 wheeled) Transfers: Sit to/from [facility] Sit to Stand: Min assist Stand pivot transfers: Min assist            Balance Overall balance assessment: Mild deficits observed, not formally tested;Needs assistance         Standing balance support: Bilateral upper extremity supported Standing balance-Leahy Scale: Poor                             ADL either performed or assessed with clinical judgement   ADL Overall ADL's : Needs assistance/impaired Eating/Feeding: Set up;Sitting   Grooming: Set up;Wash/dry  hands;Wash/dry face   Upper Body Bathing: Set up;Sitting   Lower Body Bathing: Moderate assistance;Sit to/from stand;Cueing for compensatory techniques;Cueing for safety;Cueing for sequencing   Upper Body Dressing : Set up;Cueing for safety;Cueing for sequencing   Lower Body Dressing: Moderate assistance;Sit to/from stand;Cueing for sequencing;Cueing for safety   Toilet Transfer: Moderate assistance;BSC;Stand-pivot;Cueing for safety   Toileting- Clothing Manipulation and Hygiene: Moderate assistance;Sit to/from stand;Cueing for compensatory techniques;Cueing for safety         General ADL Comments: Pt agreeable to OOB.  Lunch arrived and pt wanted to sit in chair and eat     Vision Patient Visual Report: No change from baseline              Pertinent Vitals/Pain Pain Assessment: No/denies pain        Extremity/Trunk Assessment Upper Extremity Assessment Upper Extremity Assessment: Generalized weakness           Communication Communication Communication: No difficulties   Cognition Arousal/Alertness: Awake/alert Behavior During Therapy: WFL for tasks assessed/performed Overall Cognitive Status: No family/caregiver present to determine baseline cognitive functioning Area of Impairment: Safety/judgement                         Safety/Judgement: Decreased awareness of safety;Decreased awareness of deficits                    Home Living Family/patient expects to be discharged to:: Private residence Living  Arrangements: Spouse/significant other Available Help at Discharge: Family;Available 24 hours/day Type of Home: House Home Access: Stairs to enter [employer] of Steps: 3 Entrance Stairs-Rails: Right;Left Home Layout: Multi-level;Able to live on main level with bedroom/bathroom               Home Equipment: Gilford Rile - 2 wheels;Walker - 4 wheels;Shower seat - built in          Prior Functioning/Environment Level of  Independence: Independent with assistive device(s)                 OT Problem List: Decreased strength;Decreased activity tolerance;Decreased safety awareness;Decreased knowledge of use of DME or AE      OT Treatment/Interventions: Self-care/ADL training;Therapeutic exercise;Patient/family education    OT Goals(Current goals can be found in the care plan section) Acute Rehab OT Goals Patient Stated Goal: get out of hospital OT Goal Formulation: With patient Time For Goal Achievement: 06/20/19 Potential to Achieve Goals: Good  OT Frequency: Min 2X/week              AM-PAC OT "6 Clicks" Daily Activity     Outcome Measure Help from another person eating meals?: None Help from another person taking care of personal grooming?: A Little Help from another person toileting, which includes using toliet, bedpan, or urinal?: A Little Help from another person bathing (including washing, rinsing, drying)?: A Little Help from another person to put on and taking off regular upper body clothing?: A Little Help from another person to put on and taking off regular lower body clothing?: A Lot 6 Click Score: 18   End of Session Equipment Utilized During Treatment: Rolling walker;Gait belt Nurse Communication: Mobility status  Activity Tolerance: Patient tolerated treatment well Patient left: in chair;with call bell/phone within reach  OT Visit Diagnosis: Unsteadiness on feet (R26.81);Muscle weakness (generalized) (M62.81)                Time: 3437-3578 OT Time Calculation (min): 16 min Charges:  OT General Charges $OT Visit: 1 Visit OT Evaluation $OT Eval Moderate Complexity: 1 Mod  Kari Baars, OT Acute Rehabilitation Services [phone] Office- [phone], Edwena Felty D 06/06/2019, 4:48 PM

## 2019-06-06 NOTE — Progress Notes (Signed)
PROGRESS NOTE    Gregory Bates  ZJI:[id number] DOB: 11-Apr-1941 DOA: 06/04/2019 PCP: Emmaline Kluver, MD     Brief Narrative:  Gregory Bates is a 78 y.o. male with history of extensive stage small cell lung cancer on palliative chemotherapy with history of CAD status post CABG last year, chronic diastolic CHF last EF measured in October 2019 was 60 to 50% with grade 2 diastolic dysfunction, chronic and disease stage III, diabetes mellitus type 2, anemia, paroxysmal atrial fibrillation was brought to the ER after patient had a syncopal episode.  As per patient's grandson this is a second syncopal episode in the last 24 hours.  Patient's last chemotherapy was last week.  Over the last 1 week patient has had significant decline in his function and finds it very difficult to ambulate because of weakness.  About a week ago patient was found weak in his hall at home and had incontinence of urine at that time.  24 hours ago patient was trying to walk to the bathroom when he passed out same thing happened today.  Exact number of minutes patient pattern is not clear but did have incontinence of urine after the pass out and appeared confused.  Per patient's grandson patient has been having some chest pain off and on for last few weeks.  Patient did not have any nausea vomiting abdominal pain diarrhea though was treated for colitis in June with antibiotics. CT head was done which shows metastatic lesions with edema.  On-call oncologist Dr. Burr Medico was consulted by the ER physician and at this time oncology advised patient to be transferred to [facility] long hospital.  Decadron 8 mg IV every 8 was started.    New events last 24 hours / Subjective: On my examination this morning, patient was sitting in chair, eating breakfast.  He had no complaints at the time of my initial examination.  He was supposed to start radiation therapy, discharged home.  This afternoon, I was called by RN that patient started complaining of  chest pain that patient described as an intermittent pain.  Vital signs stable, EKG obtained at that time.   Assessment & Plan:   Principal Problem:   Metastatic cancer to brain [facility]) Active Problems:   Essential hypertension   Chronic diastolic CHF (congestive heart failure) (HCC)   PAF (paroxysmal atrial fibrillation) (HCC)   CKD (chronic kidney disease), stage III (HCC)   Normochromic normocytic anemia   S/P CABG x 3   Malignant neoplasm of bronchus of right upper lobe (HCC)   Chronic respiratory failure (HCC)   COPD (chronic obstructive pulmonary disease) (HCC)   Syncope   Metastatic small cell carcinoma to brain (HCC)    Metastatic brain lesion with vasogenic edema, small cell lung cancer -CT head: new parenchymal cavitary masses with extensive vasogenic edema consistent with multifocal intracranial metastasis with central necrosis. Metastatic lesions include the high RIGHT frontal lobe, anterior LEFT frontal lobe, head of the RIGHT caudate nucleus, and LEFT Cerebellum. -MRI brain: Widespread metastatic disease has developed since the prior MRI of 08/09/2018. At least 9 lesions are seen. There is a large amount of vasogenic edema in the left frontal lobe and right parietal lobe without midline shift. Solid enhancing lesion along the lateral sphenoid wing has grown in the interval and is most consistent with meningioma. -Continue Decadron  -Oncology consulted. Start radiation tx today   Chest pain CAD status post CABG -EKG reviewed independently 8/4 which shows anterolateral T wave inversion that  does not appear to be new since 2019.  -Initially held off on cardiology consult since he had no complaints of chest pain on admission, but with new complaints this afternoon, will consult cardiology today  -Repeat troponin now  -Continue aspirin, plavix, lipitor   Falls, ?syncope -Secondary to dehydration, weakness. In discussing with wife, she denies any loss of consciousness but  instead of falling due to weakness   -Check orthostatic vital sign -Continue telemetry  Paroxysmal atrial fibrillation -Amiodarone, metoprolol, Eliquis  Diabetes mellitus type 2, poorly controlled -Ha1c 10.8 -Lantus, sliding scale insulin.  Increase insulin dosing today  Chronic respiratory failure secondary to COPD, extensive stage small cell lung cancer -Stable currently  Chronic kidney disease stage III -Baseline Cr 1.5-1.9 -Creatinine 1.12 today   Chronic diastolic CHF -Does not appear to be in exacerbation at this time, monitor closely   DVT prophylaxis: Eliquis Code Status: Full code Family Communication: Called and discussed with grandson over the phone x2  Disposition Plan: Pending cardiology consultation    Consultants:   Oncology  Cardiology   Procedures:   None  Antimicrobials:  Anti-infectives (From admission, onward)   None       Objective: Vitals:   06/06/19 0423 06/06/19 0435 06/06/19 1106 06/06/19 1205  BP: (!) 117/57  (!) 141/79 (!) 146/67  Pulse: (!) 57  69 72  Resp: 16   20  Temp: 97.8 F (36.6 C)     TempSrc: Oral     SpO2: 96%   98%  Weight:  70.4 kg    Height:        Intake/Output Summary (Last 24 hours) at 06/06/2019 1224 Last data filed at 06/06/2019 0915 Gross per 24 hour  Intake 984.63 ml  Output 575 ml  Net 409.63 ml   Filed Weights   06/04/19 2200 06/05/19 0413 06/06/19 0435  Weight: 71 kg 70.2 kg 70.4 kg    Examination: General exam: Appears calm and comfortable  Respiratory system: Clear to auscultation. Respiratory effort normal. Cardiovascular system: S1 & S2 heard. No JVD, murmurs, rubs, gallops or clicks. No pedal edema. Gastrointestinal system: Abdomen is nondistended, soft and nontender. No organomegaly or masses felt. Normal bowel sounds heard. Central nervous system: Alert and oriented. No focal neurological deficits. Extremities: Symmetric 5 x 5 power. Skin: No rashes, lesions or ulcers Psychiatry:  Judgement and insight appear stable, poor historian   Data Reviewed: I have personally reviewed following labs and imaging studies  CBC: Recent Labs  Lab 06/04/19 1725 06/05/19 0546 06/06/19 0438  WBC 9.8 7.2 11.6*  NEUTROABS  --  6.3  --   HGB 11.9* 11.6* 10.5*  HCT 36.9* 37.3* 32.6*  MCV 88.7 90.5 90.1  PLT 217 236 242   Basic Metabolic Panel: Recent Labs  Lab 06/04/19 1725 06/05/19 0546 06/06/19 0438  NA 132* 134* 134*  K 4.7 4.2 4.4  CL 95* 99 102  CO2 25 24 24   GLUCOSE 428* 220* 180*  BUN 21 22 25*  CREATININE 1.70* 1.35* 1.12  CALCIUM 9.5 9.1 9.0   GFR: Estimated Creatinine Clearance: 54.1 mL/min (by C-G formula based on SCr of 1.12 mg/dL). Liver Function Tests: Recent Labs  Lab 06/05/19 0546  AST 21  ALT 20  ALKPHOS 86  BILITOT 0.5  PROT 7.0  ALBUMIN 3.6   No results for input(s): LIPASE, AMYLASE in the last 168 hours. No results for input(s): AMMONIA in the last 168 hours. Coagulation Profile: No results for input(s): INR, PROTIME in the last  168 hours. Cardiac Enzymes: No results for input(s): CKTOTAL, CKMB, CKMBINDEX, TROPONINI in the last 168 hours. BNP (last 3 results) No results for input(s): PROBNP in the last 8760 hours. HbA1C: Recent Labs    06/05/19 0546  HGBA1C 10.8*   CBG: Recent Labs  Lab 06/05/19 1628 06/05/19 2013 06/06/19 0022 06/06/19 0420 06/06/19 0754  GLUCAP 405* 359* 120* 164* 166*   Lipid Profile: No results for input(s): CHOL, HDL, LDLCALC, TRIG, CHOLHDL, LDLDIRECT in the last 72 hours. Thyroid Function Tests: No results for input(s): TSH, T4TOTAL, FREET4, T3FREE, THYROIDAB in the last 72 hours. Anemia Panel: No results for input(s): VITAMINB12, FOLATE, FERRITIN, TIBC, IRON, RETICCTPCT in the last 72 hours. Sepsis Labs: No results for input(s): PROCALCITON, LATICACIDVEN in the last 168 hours.  Recent Results (from the past 240 hour(s))  SARS CORONAVIRUS 2 Nasal Swab Aptima Multi Swab     Status: None    Collection Time: 06/04/19  7:12 PM   Specimen: Aptima Multi Swab; Nasal Swab  Result Value Ref Range Status   SARS Coronavirus 2 NEGATIVE NEGATIVE Final    Comment: (NOTE) SARS-CoV-2 target nucleic acids are NOT DETECTED. The SARS-CoV-2 RNA is generally detectable in upper and lower respiratory specimens during the acute phase of infection. Negative results do not preclude SARS-CoV-2 infection, do not rule out co-infections with other pathogens, and should not be used as the sole basis for treatment or other patient management decisions. Negative results must be combined with clinical observations, patient history, and epidemiological information. The expected result is Negative. Fact Sheet for Patients: SugarRoll.be Fact Sheet for Healthcare Providers: https://www.woods-mathews.com/ This test is not yet approved or cleared by the [place] FDA and  has been authorized for detection and/or diagnosis of SARS-CoV-2 by FDA under an Emergency Use Authorization (EUA). This EUA will remain  in effect (meaning this test can be used) for the duration of the COVID-19 declaration under Section 56 4(b)(1) of the Act, 21 U.S.C. section 360bbb-3(b)(1), unless the authorization is terminated or revoked sooner. Performed at [facility], [place] [address] [place], [place] [address]       Radiology Studies: Ct Head Wo Contrast  Result Date: 06/04/2019 CLINICAL DATA:  Fall, history of small cell lung cancer EXAM: CT HEAD WITHOUT CONTRAST TECHNIQUE: Contiguous axial images were obtained from the base of the skull through the vertex without intravenous contrast. COMPARISON:  Brain MRI 08/10/2018 FINDINGS: Brain: New large cavitary lesion in the high RIGHT frontal lobe with thickened wall measures 3.9 x 3.0 cm. There is extensive vasogenic edema associated with this new large lesion. Similar lesion in the LEFT frontal lobe measuring 1.9 x 1.9 cm also with  vasogenic edema. Smaller midline LEFT frontal lesion measuring 1.7 cm along the anterior interhemispheric fissure. Additional lesion in the LEFT cerebellum measuring 2.0 cm Lesion in the head of the RIGHT caudate nucleus is high-density measuring 1.7 cm. Basal cisterns are patent.  No hydrocephalus. No acute intracranial hemorrhage.  No intraventricular hemorrhage Vascular: No hyperdense vessel or unexpected calcification. Skull: Normal. Negative for fracture or focal lesion. Sinuses/Orbits: No acute finding. Other: IMPRESSION: 1. Unfortunately, there are new parenchymal cavitary masses with extensive vasogenic edema consistent with multifocal intracranial metastasis with central necrosis. 2. Metastatic lesions include the high RIGHT frontal lobe, anterior LEFT frontal lobe, head of the RIGHT caudate nucleus, and LEFT cerebellum. 3. No hydrocephalus. Basal cisterns patent. No intracranial hemorrhage , Findings [place] 06/04/2019  at18:16. Electronically Signed   By: Suzy Bouchard  M.D.   On: 06/04/2019 18:17   Mr Jeri Cos GX Contrast  Result Date: 06/05/2019 CLINICAL DATA:  Small cell lung cancer.  Abnormal CT head EXAM: MRI HEAD WITHOUT AND WITH CONTRAST TECHNIQUE: Multiplanar, multiecho pulse sequences of the brain and surrounding structures were obtained without and with intravenous contrast. CONTRAST:  7 mL Gadovist IV COMPARISON:  CT head 06/04/2019, MRI 08/10/2018 FINDINGS: Brain: Multiple enhancing lesions the brain compatible with metastatic disease. These were noted on the recent CT from yesterday but were not present on the prior MRI of 08/09/2018. Extensive edema in the left frontal lobe and right parietal lobe without midline shift. Left cerebellar rim enhancing mass with central necrosis measures 28 mm with mild surrounding edema and mild hemorrhage. 6 x 15 mm lesion right anterior temporal lobe with mild edema 19 x 12 mm enhancing lesion right head of caudate with surrounding edema. 10  mm rim enhancing lesion left thalamus with mild edema Large necrotic lesion right anterior occipital lobe measures 30 x 42 mm with extensive vasogenic edema and slight hemorrhage. 10 mm lesion right medial parietal lobe 19 mm left anterior frontal lobe 21 x 26 mm left frontal convexity lesion with extensive edema. 16 mm right lateral parietal lesion Solid enhancing lesion along the lateral sphenoid wing on the left has enlarged since the prior study and now measures 23 x 17 mm and shows susceptibility and is calcified on CT. This is consistent with meningioma which has grown in the interval. Coronal images demonstrate the this appears to be extra-axial. Vascular: Normal arterial flow voids Skull and upper cervical spine: No worrisome skeletal lesions. Sinuses/Orbits: Negative Other: None IMPRESSION: Widespread metastatic disease has developed since the prior MRI of 08/09/2018. At least 9 lesions are seen. There is a large amount of vasogenic edema in the left frontal lobe and right parietal lobe without midline shift. Solid enhancing lesion along the lateral sphenoid wing has grown in the interval and is most consistent with meningioma. Electronically Signed   By: Franchot Gallo M.D.   On: 06/05/2019 12:18   Dg Chest Port 1 View  Result Date: 06/04/2019 CLINICAL DATA:  Syncope.  History of lung carcinoma EXAM: PORTABLE CHEST 1 VIEW COMPARISON:  Chest CT May 25, 2019; chest radiograph September 16, 2019 FINDINGS: There are areas of ill-defined opacity in the right base, left base, and left mid lung. The apparent consolidation seen in the left mid lung on recent CT is not appreciable by radiography. There is underlying emphysematous change, better delineated on recent CT. Heart size and pulmonary vascularity are normal. No adenopathy is identified by radiography. Patient is status post coronary artery bypass grafting. There is a left atrial appendage clamp present. There is aortic atherosclerosis. No bone lesions.  IMPRESSION: Areas of ill-defined opacity in the lung bases and left mid lung regions. No consolidation or masses evident by radiography. Cardiac silhouette within normal limits. No adenopathy appreciable. Postoperative changes noted. Aortic Atherosclerosis (ICD10-I70.0) and Emphysema (ICD10-J43.9). Electronically Signed   By: Lowella Grip III M.D.   On: 06/04/2019 17:29      Scheduled Meds:  amiodarone  200 mg Oral Daily   apixaban  5 mg Oral BID   aspirin EC  81 mg Oral Daily   atorvastatin  20 mg Oral q1800   cholecalciferol  1,000 Units Oral QPM   clopidogrel  75 mg Oral Daily   dexamethasone  8 mg Oral Q8H   feeding supplement (ENSURE ENLIVE)  237 mL Oral BID BM  feeding supplement (PRO-STAT SUGAR FREE 64)  30 mL Oral BID   insulin aspart  0-15 Units Subcutaneous Q4H   insulin glargine  20 Units Subcutaneous QHS   metoprolol tartrate  25 mg Oral BID   multivitamin with minerals  1 tablet Oral QHS   vitamin E  100 Units Oral QHS   Continuous Infusions:    LOS: 1 day      Time spent: 35 minutes   Dessa Phi, DO [facility] [email or web address] 06/06/2019, 12:24 PM

## 2019-06-06 NOTE — Telephone Encounter (Signed)
R/s appt per 8/03 sch message - unable to reach pt . Left message with new appt date and time . And appts should print on discharge papers.

## 2019-06-06 NOTE — Consult Note (Addendum)
Cardiology Consultation:   Patient ID: Gregory Bates MRN: [id number]; DOB: 1941-10-04  Admit date: 06/04/2019 Date of Consult: 06/06/2019  Primary Care Provider: Street, Gregory Mt, MD Primary Cardiologist: Gregory Chandler, MD  Primary Electrophysiologist:  None    Patient Profile:   Gregory Bates is a 78 y.o. male with a hx of CAD s/p STEMI 2013 & NSTEMI 2014 w/PCI and CABG with LA clip in 12/9935, chronic diastolic HF with EF 16-96% and G2DD, PAF on Eliquis and amiodarone, HTN, HLD, DM type 2, CKD stage 3 and lung cancer who is being seen today for the evaluation of chest pain and elevated troponin at the request of Dr. Maylene Bates.  History of Present Illness:   Gregory Bates has a medical history as above. His bypass surgery was in 07/2018. He was then seen in the hospital in 08/2018 with presentation of seizure like activity. He was placed on Keppra at that time. He also had right upper lobe collapse of the lung in the setting of recently diagnosed lung cancer. The patient was also known to have chronic hyponatremia. He is followed by Dr. Rogue Jury for oncology and pulmonology. He was also admitted a few days later for volume overload and treated with diuretics. His echocardiogram at the time of his bypass in 07/2018 had shown an improvement in his EF from 45-50% to 55-60%.   He was last seen in our office on 08/24/2018 by Gregory Drown, NP for hospital follow up. He was maintaining sinus rhythm on amiodarone, metoprolol and Eliquis. It was mentioned that continuation of amiodarone long term would need to be considered in setting of his lung disease.   Gregory Bates was admitted on 06/04/19 after a 1 week history of weakness and then 2 episodes of syncope with loss of bladder control. He is known to have metastatic brain lesions with vasogenic edema related to extensive stage small cell lung cancer. He is being given decadron per recommendation of oncology. He was found to have elevated serum creatinine and  was gently hydrated for likely dehydration with improvement in creatinine. His blood pressure was low normal. Neurology was consulted with recommendation against antiepileptics at the time. MRI done yesterday showed widespread mets, at least 9 lesions, large amount of vasogenic edema. Neurology was OK with continuing Eliquis for afib stroke risk reduction. Notes indicate that patient's grandson reported that the patient had some chest pain a few times last week but the patient denied that.   Per note review, his wife denies any loss of consciousness, but instead falling due to weakness. Today the patient complained of chest pain to the nurse. He tells me that it was not like his prior heart related chest pain. He was sitting in a chair, no exertion. On exam he points to epigastric, sub xyphoid area. He describes it as pressure, not radiating and with no associated shortness of breath, lightheadedness or other symptoms. It lasted less than 5 minutes and seemed to improve with drinking coffee. He has had no further discomfort and thinks that it was related to indigestion.   He denies any orthopnea, PND or edema.  EKG was essentially unchanged and a repeat HS troponin is down to 12.   Heart Pathway Score:     Past Medical History:  Diagnosis Date   Acute renal insufficiency    a. During 11/2013 adm with atrial flutter.   Anemia, unspecified    Atrial fibrillation (HCC)    CAD (coronary artery disease), native coronary artery  a. s/p cath May 2011 >> DES distal RCA;  b. 01/2013 NSTEMI >> OM1 99p (2.75x12 Promus Premier DES);  c. LHC (2/15): PCI: Promus DES to dist RCA ;  d. LHC 1/16 - dLM 35, oLAD 30, small D1 tandem 16, pOM1 stent ok, mOM 87-60, dOM bifurcation 50-60 in both vessels, AV 76-70 jailed by stent, pRCA 7 mRCA stent ok, dRCA stent ok, PLB smal 50, dPDA 60, 80; EF 35-40 >> med Rx - PCI of PDA if angina   Chronic combined systolic and diastolic CHF (congestive heart failure) ([place])  12/23/2015   Echo 2/17: Mild LVH, EF 45-50%, inferior akinesis, grade 2 diastolic dysfunction, mild AI, mild MR, mild LAE, PASP 44 mmHg    Colorectal polyps 2002   Diabetes mellitus    AODM   Erectile dysfunction    History of echocardiogram    a. 01/2013 Echo: EF 55%, mid-dist inflat HK, Gr1 DD, Triv AI/TR, Mild MR.;  b.  Echo (1/15): Mild LVH, EF 55%, inferolateral hypokinesis, grade 1 diastolic dysfunction, mild AI, trivial MR, moderate LAE, PASP 36 mmHg    Hx of cardiovascular stress test    Stress myoview 08/07/13 with LVEF 44%, inferior scar, no ischemia   Hyperlipidemia    Hypertension    Hyponatremia    Ischemic cardiomyopathy    a. EF 35-40% by LHC in 1/16; b. Echo 2/17: Mild LVH, EF 45-50%, inferior akinesis, grade 2 diastolic dysfunction, mild AI, mild MR, mild LAE, PASP 44 mmHg   Myocardial infarction (HCC)    Oxygen deficiency    PAF (paroxysmal atrial fibrillation) ([place])    a. Dx 11/2013. Spont conv. Placed on eliquis.   Small cell lung cancer in adult [facility]) 07/28/2018    Past Surgical History:  Procedure Laterality Date   BIOPSY  08/01/2018   Procedure: BIOPSY OF PERICARDIAL LYMPH NODE AND HILAR LYMPH NODE;  Surgeon: Grace Isaac, MD;  Location: [place];  Service: Open Heart Surgery;;   CLIPPING OF ATRIAL APPENDAGE Left 08/01/2018   Procedure: CLIPPING OF ATRIAL APPENDAGE;  Surgeon: Grace Isaac, MD;  Location: [place];  Service: Open Heart Surgery;  Laterality: Left;   COLONOSCOPY W/ POLYPECTOMY  2002   [facility]   COLONOSCOPY W/ POLYPECTOMY  2004; 2007 negative   CORONARY ANGIOPLASTY  12/26/2013   CORONARY ARTERY BYPASS GRAFT N/A 08/01/2018   Procedure: CORONARY ARTERY BYPASS GRAFTING (CABG) TIMES THREE USING LEFT INTERNAL MAMMARY ARTERY TO LAD, RIGHT GREATER SAPHENOUS VEIN GRAFT TO OM AND RCA, VEIN HARVESTED ENDOSCOPICALLY;  Surgeon: Grace Isaac, MD;  Location: [place];  Service: Open Heart Surgery;  Laterality: N/A;   CORONARY STENT  PLACEMENT  2007, 2011   infantile paralysis facial asymmetry     LEFT HEART CATH AND CORONARY ANGIOGRAPHY N/A 05/18/2017   Procedure: Left Heart Cath and Coronary Angiography;  Surgeon: Martinique, Peter M, MD;  Location: [place] CV LAB;  Service: Cardiovascular;  Laterality: N/A;   LEFT HEART CATH AND CORONARY ANGIOGRAPHY N/A 07/27/2018   Procedure: LEFT HEART CATH AND CORONARY ANGIOGRAPHY;  Surgeon: Martinique, Peter M, MD;  Location: [place] CV LAB;  Service: Cardiovascular;  Laterality: N/A;   LEFT HEART CATHETERIZATION WITH CORONARY ANGIOGRAM N/A 10/02/2012   Procedure: LEFT HEART CATHETERIZATION WITH CORONARY ANGIOGRAM;  Surgeon: Peter M Martinique, MD;  Location: [facility] CATH LAB;  Service: Cardiovascular;  Laterality: N/A;   LEFT HEART CATHETERIZATION WITH CORONARY ANGIOGRAM N/A 02/20/2013   Procedure: LEFT HEART CATHETERIZATION WITH CORONARY ANGIOGRAM;  Surgeon: Harrell Gave  Santina Evans, MD;  Location: [place] CATH LAB;  Service: Cardiovascular;  Laterality: N/A;   LEFT HEART CATHETERIZATION WITH CORONARY ANGIOGRAM N/A 12/26/2013   Procedure: LEFT HEART CATHETERIZATION WITH CORONARY ANGIOGRAM;  Surgeon: Burnell Blanks, MD;  Location[facility] CATH LAB;  Service: Cardiovascular;  Laterality: N/A;   LEFT HEART CATHETERIZATION WITH CORONARY ANGIOGRAM N/A 11/28/2014   Procedure: LEFT HEART CATHETERIZATION WITH CORONARY ANGIOGRAM;  Surgeon: Burnell Blanks, MD;  Location: [facility] CATH LAB;  Service: Cardiovascular;  Laterality: N/A;   PERCUTANEOUS CORONARY STENT INTERVENTION (PCI-S)  10/02/2012   Procedure: PERCUTANEOUS CORONARY STENT INTERVENTION (PCI-S);  Surgeon: Peter M Martinique, MD;  Location: [facility] CATH LAB;  Service: Cardiovascular;;   PERCUTANEOUS CORONARY STENT INTERVENTION (PCI-S)  02/20/2013   Procedure: PERCUTANEOUS CORONARY STENT INTERVENTION (PCI-S);  Surgeon: Burnell Blanks, MD;  Location: [facility] CATH LAB;  Service: Cardiovascular;;   PERCUTANEOUS CORONARY STENT INTERVENTION (PCI-S)   12/26/2013   Procedure: PERCUTANEOUS CORONARY STENT INTERVENTION (PCI-S);  Surgeon: Burnell Blanks, MD;  Location: [facility] CATH LAB;  Service: Cardiovascular;;   TEE WITHOUT CARDIOVERSION N/A 08/01/2018   Procedure: TRANSESOPHAGEAL ECHOCARDIOGRAM (TEE);  Surgeon: Grace Isaac, MD;  Location: [place];  Service: Open Heart Surgery;  Laterality: N/A;   WISDOM TOOTH EXTRACTION       Home Medications:  Prior to Admission medications   Medication Sig Start Date End Date Taking? Authorizing Provider  acetaminophen (TYLENOL) 325 MG tablet Take 2 tablets (650 mg total) by mouth every 6 (six) hours as needed for mild pain. 08/06/18  Yes Lars Pinks M, PA-C  albuterol (PROVENTIL HFA;VENTOLIN HFA) 108 (90 Base) MCG/ACT inhaler Inhale 2 puffs into the lungs every 6 (six) hours as needed for wheezing or shortness of breath. 08/15/18  Yes Rai, Ripudeep K, MD  amiodarone (PACERONE) 200 MG tablet TAKE 1 TABLET (200 MG TOTAL) BY MOUTH DAILY. Patient taking differently: Take 200 mg by mouth daily after breakfast.  02/23/19  Yes Burnell Blanks, MD  apixaban (ELIQUIS) 5 MG TABS tablet Take 1 tablet (5 mg total) by mouth 2 (two) times daily. 01/18/19  Yes Burnell Blanks, MD  aspirin EC 81 MG EC tablet Take 1 tablet (81 mg total) by mouth daily. 08/06/18  Yes Tacy Dura, Donielle M, PA-C  atorvastatin (LIPITOR) 20 MG tablet TAKE 1 TABLET EVERY DAY Patient taking differently: Take 20 mg by mouth daily.  10/31/18  Yes Burnell Blanks, MD  cholecalciferol (VITAMIN D) 1000 UNITS tablet Take 1,000 Units by mouth every evening.    Yes [provider]  clopidogrel (PLAVIX) 75 MG tablet TAKE 1 TABLET EVERY DAY WITH BREAKFAST Patient taking differently: Take 75 mg by mouth daily after breakfast.  02/23/19  Yes Burnell Blanks, MD  fexofenadine (ALLEGRA) 180 MG tablet Take 180 mg by mouth daily as needed for allergies.    Yes [provider]  fluticasone (FLONASE) 50  MCG/ACT nasal spray Place 1 spray into both nostrils daily as needed for allergies.    Yes [provider]  furosemide (LASIX) 20 MG tablet TAKE 1 TABLET (20 MG TOTAL) BY MOUTH DAILY. Patient taking differently: Take 20 mg by mouth daily after breakfast.  02/23/19  Yes Burnell Blanks, MD  guaiFENesin (MUCINEX) 600 MG 12 hr tablet Take 1 tablet (600 mg total) by mouth 2 (two) times daily as needed for cough or to loosen phlegm. 08/06/18  Yes Lars Pinks M, PA-C  insulin NPH-regular Human (70-30) 100 UNIT/ML injection Inject 15 Units into the  skin daily with supper.   Yes [provider]  Iron Combinations (IRON COMPLEX PO) Take 1 tablet by mouth at bedtime.    Yes [provider]  isosorbide mononitrate (IMDUR) 60 MG 24 hr tablet TAKE 1 TABLET EVERY DAY Patient taking differently: Take 60 mg by mouth daily.  10/31/18  Yes Burnell Blanks, MD  metFORMIN (GLUCOPHAGE) 1000 MG tablet Take 500 mg by mouth daily with breakfast.    Yes [provider]  metoprolol tartrate (LOPRESSOR) 25 MG tablet TAKE 1 TABLET TWICE DAILY Patient taking differently: Take 25 mg by mouth 2 (two) times daily.  12/21/18  Yes Burnell Blanks, MD  Multiple Vitamin (MULTIVITAMIN) tablet Take 1 tablet by mouth at bedtime.    Yes [provider]  nitroGLYCERIN (NITROSTAT) 0.4 MG SL tablet Place 1 tablet (0.4 mg total) under the tongue every 5 (five) minutes as needed for chest pain. 05/12/19 08/10/19 Yes Burnell Blanks, MD  ondansetron (ZOFRAN ODT) 4 MG disintegrating tablet Take 1 tablet (4 mg total) by mouth every 8 (eight) hours as needed for nausea or vomiting. 08/15/18  Yes Rai, Ripudeep K, MD  prochlorperazine (COMPAZINE) 10 MG tablet Take 1 tablet (10 mg total) by mouth every 6 (six) hours as needed for nausea or vomiting. 08/29/18  Yes Curt Bears, MD  vitamin E 100 UNIT capsule Take 100 Units by mouth at bedtime.    Yes [provider]  doxycycline (VIBRA-TABS) 100 MG tablet Take 1 tablet (100 mg total) by mouth 2 (two) times daily. Patient not taking: Reported on 06/04/2019 04/17/19   Curt Bears, MD  predniSONE (DELTASONE) 20 MG tablet Take 4 tabs po qd x 7 days, followed by 3 tab po qd x 7 days, followed by 2 tab po qd x 7 days, then one tab po qd x 7 days, then 1/2 tab po qd x one week Patient not taking: Reported on 06/04/2019 04/17/19   Curt Bears, MD    Inpatient Medications: Scheduled Meds:  amiodarone  200 mg Oral Daily   apixaban  5 mg Oral BID   aspirin EC  81 mg Oral Daily   atorvastatin  20 mg Oral q1800   cholecalciferol  1,000 Units Oral QPM   clopidogrel  75 mg Oral Daily   dexamethasone  8 mg Oral Q8H   feeding supplement (ENSURE ENLIVE)  237 mL Oral BID BM   feeding supplement (PRO-STAT SUGAR FREE 64)  30 mL Oral BID   insulin aspart  0-15 Units Subcutaneous Q4H   insulin aspart  15 Units Subcutaneous Once   insulin aspart  5 Units Subcutaneous TID WC   insulin glargine  25 Units Subcutaneous QHS   metoprolol tartrate  25 mg Oral BID   multivitamin with minerals  1 tablet Oral QHS   vitamin E  100 Units Oral QHS   Continuous Infusions:  PRN Meds: acetaminophen **OR** acetaminophen, albuterol, nitroGLYCERIN, ondansetron **OR** ondansetron (ZOFRAN) IV  Allergies:    Allergies  Allergen Reactions   Brilinta [Ticagrelor] Other (See Comments)    Arthralgias & myalgias   Crestor [Rosuvastatin] Other (See Comments)    Myalgias    Social History:   Social History   Socioeconomic History   Marital status: Married    Spouse name: Not on file   Number of children: 1   Years of education: Not on file   Highest education level: Not on file  Occupational History   Occupation: maintenance  Comment: full time  Scientist, product/process development strain: Not hard at all   Food insecurity    Worry: Never true    Inability: Never true    Transportation needs    Medical: Yes    Non-medical: Yes  Tobacco Use   Smoking status: Former Smoker    Packs/day: 2.00    Years: 30.00    Pack years: 60.00    Types: Cigarettes    Quit date: 11/02/1986    Years since quitting: 32.6   Smokeless tobacco: Never Used   Tobacco comment: smoked [place], up to 3 ppd (!)  Substance and Sexual Activity   Alcohol use: No   Drug use: No   Sexual activity: Not on file  Lifestyle   Physical activity    Days per week: Patient refused    Minutes per session: Patient refused   Stress: Not at all  Relationships   Social connections    Talks on phone: Patient refused    Gets together: Patient refused    Attends religious service: Patient refused    Active member of club or organization: Patient refused    Attends meetings of clubs or organizations: Patient refused    Relationship status: Patient refused   Intimate partner violence    Fear of current or ex partner: Patient refused    Emotionally abused: Patient refused    Physically abused: Patient refused    Forced sexual activity: Patient refused  Other Topics Concern   Not on file  Social History Narrative   [employer] - does maintenance    Family History:    Family History  Problem Relation Age of Onset   Heart attack Brother 5   Colon cancer Brother    Prostate cancer Brother    Breast cancer Sister    Stroke Sister    Diabetes Neg Hx    Hypertension Neg Hx      ROS:  Please see the history of present illness.   All other ROS reviewed and negative.     Physical Exam/Data:   Vitals:   06/06/19 0423 06/06/19 0435 06/06/19 1106 06/06/19 1205  BP: (!) 117/57  (!) 141/79 (!) 146/67  Pulse: (!) 57  69 72  Resp: 16   20  Temp: 97.8 F (36.6 C)     TempSrc: Oral     SpO2: 96%   98%  Weight:  70.4 kg    Height:        Intake/Output Summary (Last 24 hours) at 06/06/2019 1302 Last data filed at 06/06/2019 0915 Gross per 24 hour  Intake 984.63  ml  Output 575 ml  Net 409.63 ml   Last 3 Weights 06/06/2019 06/05/2019 06/04/2019  Weight (lbs) 155 lb 3.2 oz 154 lb 12.8 oz 156 lb 8.4 oz  Weight (kg) 70.398 kg 70.217 kg 71 kg     Body mass index is 21.65 kg/m.  General:  Thin, frail, elderly mail, in no acute distress HEENT: normal Lymph: no adenopathy Neck: no JVD Endocrine:  No thryomegaly Vascular: No carotid bruits; FA pulses 2+ bilaterally without bruits  Cardiac:  normal S1, S2; RRR; no murmur  Lungs:  clear to auscultation bilaterally, slightly diminished throughout, no wheezing, rhonchi or rales  Abd: soft, nontender, no hepatomegaly  Ext: no edema Musculoskeletal:  No deformities, BUE and BLE strength normal and equal Skin: warm and dry  Neuro:  CNs 2-12 intact, no focal abnormalities noted Psych:  Normal affect  EKG:  The EKG was personally reviewed and demonstrates:  Initially-Sinus bradycardia, 59 bpm, Short PR interval, Low voltage, Diffuse TWI-which were previously present in 08/2018, EKG machine reading as prolonged QT but by my calculation it only about .42, not prolonged. EKG today with NSR with unchanged TWI, poss slight ST depression in V3-6. Telemetry:  Telemetry was personally reviewed and demonstrates:  Sinus rhythm in  Low 70's.   Relevant CV Studies:  Echocardiogram 07/28/2018: Study Conclusions - Left ventricle: The cavity size was normal. There was moderate concentric hypertrophy. Systolic function was normal. The estimated ejection fraction was in the range of 55% to 60%. Wall motion was normal; there were no regional wall motion abnormalities. Features are consistent with a pseudonormal left ventricular filling pattern, with concomitant abnormal relaxation and increased filling pressure (grade 2 diastolic dysfunction). Doppler parameters are consistent with elevated ventricular end-diastolic filling pressure. - Aortic valve: Trileaflet; moderately thickened, moderately calcified  leaflets. There was mild regurgitation. - Aortic root: The aortic root was normal in size. - Mitral valve: There was mild regurgitation. - Left atrium: The atrium was mildly dilated. - Right ventricle: The cavity size was normal. Wall thickness was normal. Systolic function was normal. - Tricuspid valve: There was mild regurgitation. - Pulmonic valve: There was no regurgitation. - Pulmonary arteries: Systolic pressure was mildly increased. PA peak pressure: 31 mm Hg (S). - Inferior vena cava: The vessel was normal in size. - Pericardium, extracardiac: There was no pericardial effusion.  Impressions: - There is hypokinesis of the basal inferior and inefrolateral walls but other walls are hyperdynamic, overall LVEF 55-60%. This is improved from the prior study on 12/23/2015 (45-50%)   Laboratory Data:  High Sensitivity Troponin:   Recent Labs  Lab 06/04/19 1725 06/04/19 1906 06/05/19 0634  TROPONINIHS 50* 54* 28*     Cardiac EnzymesNo results for input(s): TROPONINI in the last 168 hours. No results for input(s): TROPIPOC in the last 168 hours.  Chemistry Recent Labs  Lab 06/04/19 1725 06/05/19 0546 06/06/19 0438  NA 132* 134* 134*  K 4.7 4.2 4.4  CL 95* 99 102  CO2 25 24 24   GLUCOSE 428* 220* 180*  BUN 21 22 25*  CREATININE 1.70* 1.35* 1.12  CALCIUM 9.5 9.1 9.0  GFRNONAA 38* 50* >60  GFRAA 44* 58* >60  ANIONGAP 12 11 8     Recent Labs  Lab 06/05/19 0546  PROT 7.0  ALBUMIN 3.6  AST 21  ALT 20  ALKPHOS 86  BILITOT 0.5   Hematology Recent Labs  Lab 06/04/19 1725 06/05/19 0546 06/06/19 0438  WBC 9.8 7.2 11.6*  RBC 4.16* 4.12* 3.62*  HGB 11.9* 11.6* 10.5*  HCT 36.9* 37.3* 32.6*  MCV 88.7 90.5 90.1  MCH 28.6 28.2 29.0  MCHC 32.2 31.1 32.2  RDW 14.7 14.8 14.9  PLT 217 236 200   BNPNo results for input(s): BNP, PROBNP in the last 168 hours.  DDimer No results for input(s): DDIMER in the last 168 hours.   Radiology/Studies:  Ct Head Wo  Contrast  Result Date: 06/04/2019 CLINICAL DATA:  Fall, history of small cell lung cancer EXAM: CT HEAD WITHOUT CONTRAST TECHNIQUE: Contiguous axial images were obtained from the base of the skull through the vertex without intravenous contrast. COMPARISON:  Brain MRI 08/10/2018 FINDINGS: Brain: New large cavitary lesion in the high RIGHT frontal lobe with thickened wall measures 3.9 x 3.0 cm. There is extensive vasogenic edema associated with this new large lesion. Similar lesion in the LEFT frontal  lobe measuring 1.9 x 1.9 cm also with vasogenic edema. Smaller midline LEFT frontal lesion measuring 1.7 cm along the anterior interhemispheric fissure. Additional lesion in the LEFT cerebellum measuring 2.0 cm Lesion in the head of the RIGHT caudate nucleus is high-density measuring 1.7 cm. Basal cisterns are patent.  No hydrocephalus. No acute intracranial hemorrhage.  No intraventricular hemorrhage Vascular: No hyperdense vessel or unexpected calcification. Skull: Normal. Negative for fracture or focal lesion. Sinuses/Orbits: No acute finding. Other: IMPRESSION: 1. Unfortunately, there are new parenchymal cavitary masses with extensive vasogenic edema consistent with multifocal intracranial metastasis with central necrosis. 2. Metastatic lesions include the high RIGHT frontal lobe, anterior LEFT frontal lobe, head of the RIGHT caudate nucleus, and LEFT cerebellum. 3. No hydrocephalus. Basal cisterns patent. No intracranial hemorrhage , Findings conveyed toGoldstonon 06/04/2019  at18:16. Electronically Signed   By: Suzy Bouchard M.D.   On: 06/04/2019 18:17   Mr Jeri Cos [facility] Contrast  Result Date: 06/05/2019 CLINICAL DATA:  Small cell lung cancer.  Abnormal CT head EXAM: MRI HEAD WITHOUT AND WITH CONTRAST TECHNIQUE: Multiplanar, multiecho pulse sequences of the brain and surrounding structures were obtained without and with intravenous contrast. CONTRAST:  7 mL Gadovist IV COMPARISON:  CT head 06/04/2019, MRI  08/10/2018 FINDINGS: Brain: Multiple enhancing lesions the brain compatible with metastatic disease. These were noted on the recent CT from yesterday but were not present on the prior MRI of 08/09/2018. Extensive edema in the left frontal lobe and right parietal lobe without midline shift. Left cerebellar rim enhancing mass with central necrosis measures 28 mm with mild surrounding edema and mild hemorrhage. 6 x 15 mm lesion right anterior temporal lobe with mild edema 19 x 12 mm enhancing lesion right head of caudate with surrounding edema. 10 mm rim enhancing lesion left thalamus with mild edema Large necrotic lesion right anterior occipital lobe measures 30 x 42 mm with extensive vasogenic edema and slight hemorrhage. 10 mm lesion right medial parietal lobe 19 mm left anterior frontal lobe 21 x 26 mm left frontal convexity lesion with extensive edema. 16 mm right lateral parietal lesion Solid enhancing lesion along the lateral sphenoid wing on the left has enlarged since the prior study and now measures 23 x 17 mm and shows susceptibility and is calcified on CT. This is consistent with meningioma which has grown in the interval. Coronal images demonstrate the this appears to be extra-axial. Vascular: Normal arterial flow voids Skull and upper cervical spine: No worrisome skeletal lesions. Sinuses/Orbits: Negative Other: None IMPRESSION: Widespread metastatic disease has developed since the prior MRI of 08/09/2018. At least 9 lesions are seen. There is a large amount of vasogenic edema in the left frontal lobe and right parietal lobe without midline shift. Solid enhancing lesion along the lateral sphenoid wing has grown in the interval and is most consistent with meningioma. Electronically Signed   By: Franchot Gallo M.D.   On: 06/05/2019 12:18   Dg Chest Port 1 View  Result Date: 06/04/2019 CLINICAL DATA:  Syncope.  History of lung carcinoma EXAM: PORTABLE CHEST 1 VIEW COMPARISON:  Chest CT May 25, 2019;  chest radiograph September 16, 2019 FINDINGS: There are areas of ill-defined opacity in the right base, left base, and left mid lung. The apparent consolidation seen in the left mid lung on recent CT is not appreciable by radiography. There is underlying emphysematous change, better delineated on recent CT. Heart size and pulmonary vascularity are normal. No adenopathy is identified by radiography. Patient is status  post coronary artery bypass grafting. There is a left atrial appendage clamp present. There is aortic atherosclerosis. No bone lesions. IMPRESSION: Areas of ill-defined opacity in the lung bases and left mid lung regions. No consolidation or masses evident by radiography. Cardiac silhouette within normal limits. No adenopathy appreciable. Postoperative changes noted. Aortic Atherosclerosis (ICD10-I70.0) and Emphysema (ICD10-J43.9). Electronically Signed   By: Lowella Grip III M.D.   On: 06/04/2019 17:29    Assessment and Plan:   Epigastric pain -Pt had initially denies chest pain, but complained of intermittent chest pain to the nurse today. Pt tells me that he had one episode of epigastric pressure that resolved with drinking coffee. No radiation and no associated symptoms. He feels like it was GI related, "indigestion". Not like his previous heart related chest pain.  -Initial EKG with diffuse TWI, also previously present in 08/2018. EKG today with slight ST depression laterally. -HS troponins initially only mildly elevated in flat pattern then improved, 50> 54> 28, likely related to AKI. Repeat HS troponin today in setting of c/o CP was 12.   Would repeat one more troponin -For antianginals, would continue metoprolol and add back home imdur  Syncope and weakness  -Pt presented after weakness and 2 episodes of syncope with loss of bladder control.  -Per note review, his wife denies any loss of consciousness, but instead falling due to weakness. -His symptoms are likely related to  progressive metastatic cancer.  -Blood pressures have been low normal. Pt felt to be dehydrated. Improved symptoms with gentle hydration.  -Pt will probably need to stop his home lasix and only take as needed for increased wt, edema.  -Pt feeling much better and not confused.   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 CT 06/04/2019 shows new parenchymal cavitary masses with extensive vasogenic edema consistent with multifocal metastasis with central necrosis.  -Brain MRI shows widespread mets, at least 9 lesions, large amount of vasogenic edema. -Per primary, on decadron. -Per Oncology "Palliative systemic chemotherapy with carboplatin for AUC of 5 on day 1, etoposide 100 mg/M2 on days 1, 2 and 3 in addition to Tecentriq (Atezolizumab) 1200 mg IV every 3 weeks. Status post 12cycles. Starting from cycle #5 the patient will be treated with maintenance immunotherapy with Tecentriq 1200 mg IV every 3 weeks." They plan to delay his next cycle for a week.  -Planning to start radiation today.   CAD s/p CABG (s/p STEMI in 2013 and NSTEMI 2014 w/ PCI to RCA and OM1):  -He continues on medical therapy with aspirin, Plavix, statin BB, Imdur.  -With his advanced lung cancer with mets, I would decrease some of his meds. He is also anticoagulated with Eliquis for afib. Would stop Plavix  Paroxysmal atrial fibrillation  -On Amiodarone, metoprolol for rhythm control. Hx of left atrial clip with CABG in 08/2018. On Eliquis for stroke risk reduction.  -Maintaining SR on tele.   Chronic diastolic CHF  -Pt denies any recent orthopnea, PND or edema.  -Pt was dehydrated on presentation with weakness. Lasix being held and pt gently hydrated. No signs of volume overload. Will need to watch volume status with administration of IV fluids and Decadron.   Hypertension  -Blood pressure was low normal, now stable.   CKD stage 3  -Scr 1.70 on presentation, improved to 1.35  after gentle hydration, 1.12 today.  Hyponatremia, chronic issue  -Sodium level 132 on presentation, similar to past levels, has been as low as 126  recently, 112 in the past. Currently 134.   Hyperlipidemia  -Continues on statin.   For questions or updates, please contact [place] Please consult [email or web address] for contact info under    Signed, Daune Perch, NP  06/06/2019 1:02 PM    All studies reviewed, discussed findings as above and agree with above documentation.  Patient is a 20M CAD s/p STEMI 2013 & NSTEMI 2014 w/PCI and CABG with LA clip in 11/6107, chronic diastolic HF with EF 60-45% and G2DD, PAF on Eliquis and amiodarone, HTN, HLD, DM type 2, CKD stage 3 and lung cancer who presented with syncope and found to have metastatic brain lesions.  Also with AKI that resolved with IV fluids.  He reported epigastric pressure today, that he says was not like chest pain with prior Mis, reports lasted 30 minutes and resolved.  EKG shows NSR, downsloping ST depressions V3-6.  On exam, he is alert and oriented, no acute distress, RRR with no murmurs, lungs CTAB, no LE edema or JVD.  Initial hsTrop 12.   Epigastric pain is atypical but could represent anginal equivalent.  Does have ischemic EKG changes with more pronounced ST depressions/TWI.  Would check one more troponin to rule out MI.  Will titrate up his antianginals: add back his home imdur.  Continue ASA and Eliquis, but will discontinue plavix as no recent stents and would want to avoid triple therapy, particularly in setting of multiple brain mets.  Continue statin.  Would also recommending changing lasix to prn on discharge as presented with dehydration.

## 2019-06-06 NOTE — Telephone Encounter (Signed)
Brain irradiation side effects - Grandson,Joshua, asking  what are the side effects . I offered to transfer call to xrt ,but he said he will call Dr Sondra Come later today .He and pt wife will be taking care of him at home.  Pt is being discharged to home today.

## 2019-06-07 ENCOUNTER — Ambulatory Visit
Admit: 2019-06-07 | Discharge: 2019-06-07 | Disposition: A | Discharge status: Home / Self Care | Payer: Medicare HMO | Attending: Radiation Oncology | Admitting: Radiation Oncology

## 2019-06-07 LAB — BASIC METABOLIC PANEL
Anion gap: 9 (ref 5–15)
BUN: 32 mg/dL — ABNORMAL HIGH (ref 8–23)
CO2: 21 mmol/L — ABNORMAL LOW (ref 22–32)
Calcium: 8.8 mg/dL — ABNORMAL LOW (ref 8.9–10.3)
Chloride: 102 mmol/L (ref 98–111)
Creatinine, Ser: 1.14 mg/dL (ref 0.61–1.24)
GFR calc Af Amer: >60 mL/min (ref >60)
GFR calc non Af Amer: >60 mL/min (ref >60)
Glucose, Bld: 179 mg/dL — ABNORMAL HIGH (ref 70–99)
Potassium: 4.3 mmol/L (ref 3.5–5.1)
Sodium: 132 mmol/L — ABNORMAL LOW (ref 135–145)

## 2019-06-07 LAB — CBC
HCT: 33.8 % — ABNORMAL LOW (ref 39.0–52.0)
Hemoglobin: 10.7 g/dL — ABNORMAL LOW (ref 13.0–17.0)
MCH: 28.5 pg (ref 26.0–34.0)
MCHC: 31.7 g/dL (ref 30.0–36.0)
MCV: 90.1 fL (ref 80.0–100.0)
Platelets: 240 10*3/uL (ref 150–400)
RBC: 3.75 MIL/uL — ABNORMAL LOW (ref 4.22–5.81)
RDW: 15.2 % (ref 11.5–15.5)
WBC: 12.4 10*3/uL — ABNORMAL HIGH (ref 4.0–10.5)
nRBC: 0 % (ref 0.0–0.2)

## 2019-06-07 LAB — GLUCOSE, CAPILLARY
Glucose-Capillary: 111 mg/dL — ABNORMAL HIGH (ref 70–99)
Glucose-Capillary: 132 mg/dL — ABNORMAL HIGH (ref 70–99)
Glucose-Capillary: 162 mg/dL — ABNORMAL HIGH (ref 70–99)
Glucose-Capillary: 213 mg/dL — ABNORMAL HIGH (ref 70–99)
Glucose-Capillary: 303 mg/dL — ABNORMAL HIGH (ref 70–99)
Glucose-Capillary: 404 mg/dL — ABNORMAL HIGH (ref 70–99)
Glucose-Capillary: 462 mg/dL — ABNORMAL HIGH (ref 70–99)
Glucose-Capillary: 49 mg/dL — ABNORMAL LOW (ref 70–99)

## 2019-06-07 MED ORDER — DEXAMETHASONE 4 MG PO TABS
4.0000 mg | ORAL_TABLET | Freq: Four times a day (QID) | ORAL | Status: DC
  Administered 2019-06-08 (×4): 4 mg via ORAL
  Filled 2019-06-07 (×4): qty 1

## 2019-06-07 MED ORDER — GLUCERNA SHAKE PO LIQD
237.0000 mL | Freq: Three times a day (TID) | ORAL | Status: DC
  Administered 2019-06-07 – 2019-06-08 (×2): 237 mL via ORAL
  Filled 2019-06-07 (×5): qty 237

## 2019-06-07 MED ORDER — INSULIN ASPART 100 UNIT/ML ~~LOC~~ SOLN
10.0000 [IU] | Freq: Once | SUBCUTANEOUS | Status: AC
  Administered 2019-06-07: 10 [IU] via SUBCUTANEOUS

## 2019-06-07 MED ORDER — INSULIN GLARGINE 100 UNIT/ML ~~LOC~~ SOLN
35.0000 [IU] | Freq: Every day | SUBCUTANEOUS | Status: DC
  Administered 2019-06-07: 35 [IU] via SUBCUTANEOUS
  Filled 2019-06-07: qty 0.35

## 2019-06-07 NOTE — TOC Initial Note (Signed)
Transition of Care [facility]) - Initial/Assessment Note    Patient Details  Name: Gregory Bates MRN: [id number] Date of Birth: [birth year]  [facility]) CM/SW Contact:    Wende Neighbors, LCSW Phone Number: 06/07/2019, 12:22 PM  Clinical Narrative:      CSW spoke with patient via phone. Patient stated he is agreeable to have [facility] follow him in the home. Patient stated he does not care which agency follows as long as it will be covered by insurance. Patient denied 3 in 1 bedside commode stating that he already has one in the home. Pateint requested that agency please contact wife to schedule time for them to come into the home.   CSW was able to have [facility] follow patient for PT and OT needs          Expected Discharge Plan: [place] Barriers to Discharge: No Barriers Identified   Patient Goals and CMS Choice Patient states their goals for this hospitalization and ongoing recovery are:: to be home with spouse CMS Medicare.gov Compare Post Acute Care list provided to:: Patient Choice offered to / list presented to : Patient  Expected Discharge Plan and Services Expected Discharge Plan: [place] In-house Referral: Clinical Social Work   Post Acute Care Choice: [place] arrangements for the past 2 months: [place] Expected Discharge Date: 06/06/19               DME Arranged: Patient refused services         HH Arranged: PT, OT HH Agency: [employer] Date [place]: 06/07/19 Time [place]: 1219 Representative spoke with at [place]: Tommi Rumps  Prior Living Arrangements/Services Living arrangements for the past 2 months: [place] Lives with:: Self, Spouse Patient language and need for interpreter reviewed:: Yes Do you feel safe going back to the place where you live?: Yes      Need for Family Participation in Patient Care: Yes (Comment) Care giver support system in place?:  Yes (comment)   Criminal Activity/Legal Involvement Pertinent to Current Situation/Hospitalization: No - Comment as needed  Activities of Daily Living Home Assistive Devices/Equipment: Cane (specify quad or straight), Walker (specify type), Bedside commode/3-in-1 ADL Screening (condition at time of admission) Patient's cognitive ability adequate to safely complete daily activities?: Yes Is the patient deaf or have difficulty hearing?: No Does the patient have difficulty seeing, even when wearing glasses/contacts?: No Does the patient have difficulty concentrating, remembering, or making decisions?: No Patient able to express need for assistance with ADLs?: Yes Does the patient have difficulty dressing or bathing?: No Independently performs ADLs?: Yes (appropriate for developmental age) Does the patient have difficulty walking or climbing stairs?: No Weakness of Legs: None Weakness of Arms/Hands: None  Permission Sought/Granted Permission sought to share information with : Case Manager Permission granted to share information with : Yes, Verbal Permission Granted  Share Information with NAME: Malon Kindle  Permission granted to share info w AGENCY: [facility]  Permission granted to share info w Relationship: spouse  Permission granted to share info w Contact Information: [phone]  Emotional Assessment Appearance:: Appears stated age Attitude/Demeanor/Rapport: Engaged Affect (typically observed): Accepting, Happy, Pleasant Orientation: : Oriented to Self, Oriented to Place, Oriented to Situation, Oriented to  Time Alcohol / Substance Use: Not Applicable Psych Involvement: No (comment)  Admission diagnosis:  Syncope [R55] Patient Active Problem List   Diagnosis Date Noted  . Metastatic small  cell carcinoma to brain ([place]) 06/05/2019  . Syncope 06/04/2019  . Metastatic cancer to brain ([place]) 06/04/2019  . COPD (chronic obstructive pulmonary disease) ([place]) 10/03/2018  . Renal  insufficiency 09/19/2018  . Pleural effusion, right   . Chronic respiratory failure ([place]) 08/13/2018  . Acute respiratory failure ([place]) 08/13/2018  . Leukocytosis   . Extensive stage primary small cell carcinoma of lung ([place]) 08/11/2018  . Encounter for antineoplastic chemotherapy 08/11/2018  . Encounter for antineoplastic immunotherapy 08/11/2018  . Goals of care, counseling/discussion 08/11/2018  . Acute respiratory failure with hypoxia ([place]) 08/09/2018  . Seizure-like activity ([place]) 08/09/2018  . S/P CABG x 3 08/01/2018  . Malignant neoplasm of bronchus of right upper lobe ([place]) 07/28/2018  . PAF (paroxysmal atrial fibrillation) ([place]) 07/26/2018  . CKD (chronic kidney disease), stage III ([place]) 07/26/2018  . Abnormal CXR 07/26/2018  . Normochromic normocytic anemia   . Hyponatremia 10/15/2017  . Nausea & vomiting 10/15/2017  . Chronic diastolic CHF (congestive heart failure) ([place]) 12/23/2015  . Exertional angina (HCC) 11/21/2014  . Unstable angina ([place]) 12/26/2013  . Atrial flutter ([place]) 11/03/2013  . CAD (coronary artery disease) 10/02/2012  . Type 2 diabetes mellitus with vascular disease ([place]) 09/18/2010  . Essential hypertension 03/28/2010  . Normocytic anemia 12/04/2008  . COLONIC POLYPS, RECURRENT 04/26/2008  . HYPERLIPIDEMIA 10/07/2007  . ERECTILE DYSFUNCTION 10/07/2007   PCP:  Street, Sharon Mt, MD Pharmacy:   [id number] - [place], [place] - [address] [facility] [place] [address] Phone: [phone] Fax: [phone]     Social Determinants of Health (SDOH) Interventions    Readmission Risk Interventions No flowsheet data found.

## 2019-06-07 NOTE — Progress Notes (Signed)
[name]  PROGRESS NOTE    Gregory Bates  ONG:[id number] DOB: [birth year] DOA: 06/04/2019 PCP: Emmaline Kluver, MD   Brief Narrative:   Gregory Bates is a 78 y.o.malewithhistory of extensive stage small cell lung cancer on palliative chemotherapy with history of CAD status post CABG last year, chronic diastolic CHF last EF measured in October 2019 was 86 to 50% with grade 2 diastolic dysfunction, chronic and disease stage III, diabetes mellitus type 2, anemia, paroxysmal atrial fibrillation was brought to the ER after patient had a syncopal episode. As per patient's grandson this is a second syncopal episode in the last 24 hours. Patient's last chemotherapy was last week. Over the last 1 week patient has had significant decline in his function and finds it very difficult to ambulate because of weakness. About a week ago patient was found weak in his hall at home and had incontinence of urine at that time. 24 hours ago patient was trying to walk to the bathroom when he passed out same thing happened today. Exact number of minutes patient pattern is not clear but did have incontinence of urine after the pass out and appeared confused. Per patient's grandson patient has been having some chest pain off and on for last few weeks. Patient did not have any nausea vomiting abdominal pain diarrhea though was treated for colitis in June with antibiotics. CT head was done which shows metastatic lesions with edema. On-call oncologist Dr. Izora Gala consulted by the ER physician and at this time oncology advised patient to be transferred to [facility]. Decadron 8 mg IV every 8 was started.    Assessment & Plan:   Principal Problem:   Metastatic cancer to brain [facility]) Active Problems:   Essential hypertension   Chronic diastolic CHF (congestive heart failure) (HCC)   PAF (paroxysmal atrial fibrillation) (HCC)   CKD (chronic kidney disease), stage III (HCC)   Normochromic normocytic anemia  S/P CABG x 3   Malignant neoplasm of bronchus of right upper lobe (HCC)   Chronic respiratory failure (HCC)   COPD (chronic obstructive pulmonary disease) (HCC)   Syncope   Metastatic small cell carcinoma to brain (HCC)   Metastatic brain lesion with vasogenic edema, small cell lung cancer     - CT head: new parenchymal cavitary masses with extensive vasogenic edema consistent with multifocal intracranial metastasis with central necrosis. Metastatic lesions include the high RIGHT frontal lobe, anterior LEFT frontal lobe, head of the RIGHT caudate nucleus, and LEFT Cerebellum.     - MRI brain: Widespread metastatic disease has developed since the prior MRI of 08/09/2018. At least 9 lesions are seen. There is a large amount of vasogenic edema in the left frontal lobe and right parietal lobe without midline shift. Solid enhancing lesion along the lateral sphenoid wing has grown in the interval and is most consistent with meningioma.     - oncology has seen; appreciate recs     - Continue Decadron; but decrease to 4mg  q6h per onco note      - Start radiation 06/07/2019   Chest pain     - CAD status post CABG     - EKG reviewed independently 8/4 which shows anterolateral T wave inversion that does not appear to be new since 2019.      - Initially held off on cardiology consult since he had no complaints of chest pain on admission, but with new complaints 06/06/2019, cards consulted, appreciate assistance      -  Continue aspirin, plavix, lipitor   Falls, ?syncope     - Secondary to dehydration, weakness. In discussing with wife, she denies any loss of consciousness but instead of falling due to weakness       - Check orthostatic vital sign     - Continue telemetry     - PT/OT rec HH PT/OT; CM consulted  Paroxysmal atrial fibrillation     - Amiodarone, metoprolol, Eliquis  Diabetes mellitus type 2, poorly controlled     - a1c 10.8     - Lantus, sliding scale insulin.     - decadron not  helping, increase insulin  Chronic respiratory failure secondary to COPD, extensive stage small cell lung cancer     - Stable currently  Chronic kidney disease stage III     - Baseline Cr 1.5-1.9; monitor  Chronic diastolic CHF     - Does not appear to be in exacerbation at this time, monitor closely  Sugars are still uncontrolled. Requiring increased insulin w/ glucose grtr than 450. Change decadron to 4mg  q6h per last onco note. Increase lantus to 35 units. Monitor. Likely home in AM.  DVT prophylaxis: Eliquis Code Status: FULL Family Communication: Wife at bedside   Disposition Plan: To home with [facility] when stable.  Consultants:   Cardiology  Oncology  Procedures:   Radiotherapy    ROS:  Denies CP, SOB, N/V. Remainder 10-pt ROS is negative for all not previously mentioned.  Subjective: "I think it's good!"  Objective: Vitals:   06/06/19 1954 06/07/19 0421 06/07/19 1059 06/07/19 1312  BP: (!) 120/55 (!) 106/48 (!) 118/50 (!) 120/49  Pulse: (!) 52 (!) 53 62 (!) 52  Resp: 14 14  18   Temp: 97.7 F (36.5 C) 97.9 F (36.6 C)  97.7 F (36.5 C)  TempSrc:  Oral  Oral  SpO2: 100% 98%  100%  Weight:  71.9 kg    Height:        Intake/Output Summary (Last 24 hours) at 06/07/2019 1538 Last data filed at 06/07/2019 1311 Gross per 24 hour  Intake 1200 ml  Output 650 ml  Net 550 ml   Filed Weights   06/05/19 0413 06/06/19 0435 06/07/19 0421  Weight: 70.2 kg 70.4 kg 71.9 kg    Examination:  General: 78 y.o. male resting in chair in NAD Eyes: PERRL, normal sclera ENMT: Nares patent w/o discharge, orophaynx clear, dentition normal, ears w/o discharge/lesions/ulcers Cardiovascular: RRR, +S1, S2, no m/g/r, equal pulses throughout Respiratory: CTABL, no w/r/r, normal WOB GI: BS+, NDNT, no masses noted, no organomegaly noted MSK: No e/c/c Skin: No rashes, bruises, ulcerations noted Neuro: alert to name, follows commands Psyc: Appropriate interaction and affect,  calm/cooperative   Data Reviewed: I have personally reviewed following labs and imaging studies.  CBC: Recent Labs  Lab 06/04/19 1725 06/05/19 0546 06/06/19 0438 06/07/19 0511  WBC 9.8 7.2 11.6* 12.4*  NEUTROABS  --  6.3  --   --   HGB 11.9* 11.6* 10.5* 10.7*  HCT 36.9* 37.3* 32.6* 33.8*  MCV 88.7 90.5 90.1 90.1  PLT 217 236 200 665   Basic Metabolic Panel: Recent Labs  Lab 06/04/19 1725 06/05/19 0546 06/06/19 0438 06/07/19 0511  NA 132* 134* 134* 132*  K 4.7 4.2 4.4 4.3  CL 95* 99 102 102  CO2 25 24 24  21*  GLUCOSE 428* 220* 180* 179*  BUN 21 22 25* 32*  CREATININE 1.70* 1.35* 1.12 1.14  CALCIUM 9.5 9.1 9.0 8.8*   GFR:  Estimated Creatinine Clearance: 54.3 mL/min (by C-G formula based on SCr of 1.14 mg/dL). Liver Function Tests: Recent Labs  Lab 06/05/19 0546  AST 21  ALT 20  ALKPHOS 86  BILITOT 0.5  PROT 7.0  ALBUMIN 3.6   No results for input(s): LIPASE, AMYLASE in the last 168 hours. No results for input(s): AMMONIA in the last 168 hours. Coagulation Profile: No results for input(s): INR, PROTIME in the last 168 hours. Cardiac Enzymes: No results for input(s): CKTOTAL, CKMB, CKMBINDEX, TROPONINI in the last 168 hours. BNP (last 3 results) No results for input(s): PROBNP in the last 8760 hours. HbA1C: Recent Labs    06/05/19 0546  HGBA1C 10.8*   CBG: Recent Labs  Lab 06/07/19 0010 06/07/19 0416 06/07/19 0827 06/07/19 1203 06/07/19 1429  GLUCAP 111* 132* 162* 462* 404*   Lipid Profile: No results for input(s): CHOL, HDL, LDLCALC, TRIG, CHOLHDL, LDLDIRECT in the last 72 hours. Thyroid Function Tests: No results for input(s): TSH, T4TOTAL, FREET4, T3FREE, THYROIDAB in the last 72 hours. Anemia Panel: No results for input(s): VITAMINB12, FOLATE, FERRITIN, TIBC, IRON, RETICCTPCT in the last 72 hours. Sepsis Labs: No results for input(s): PROCALCITON, LATICACIDVEN in the last 168 hours.  Recent Results (from the past 240 hour(s))  SARS  CORONAVIRUS 2 Nasal Swab Aptima Multi Swab     Status: None   Collection Time: 06/04/19  7:12 PM   Specimen: Aptima Multi Swab; Nasal Swab  Result Value Ref Range Status   SARS Coronavirus 2 NEGATIVE NEGATIVE Final    Comment: (NOTE) SARS-CoV-2 target nucleic acids are NOT DETECTED. The SARS-CoV-2 RNA is generally detectable in upper and lower respiratory specimens during the acute phase of infection. Negative results do not preclude SARS-CoV-2 infection, do not rule out co-infections with other pathogens, and should not be used as the sole basis for treatment or other patient management decisions. Negative results must be combined with clinical observations, patient history, and epidemiological information. The expected result is Negative. Fact Sheet for Patients: SugarRoll.be Fact Sheet for Healthcare Providers: https://www.woods-mathews.com/ This test is not yet approved or cleared by the [place] FDA and  has been authorized for detection and/or diagnosis of SARS-CoV-2 by FDA under an Emergency Use Authorization (EUA). This EUA will remain  in effect (meaning this test can be used) for the duration of the COVID-19 declaration under Section 56 4(b)(1) of the Act, 21 U.S.C. section 360bbb-3(b)(1), unless the authorization is terminated or revoked sooner. Performed at [facility], [place] [address], [place], [place] [address]       Radiology Studies: No results found.   Scheduled Meds: . amiodarone  200 mg Oral Daily  . apixaban  5 mg Oral BID  . aspirin EC  81 mg Oral Daily  . atorvastatin  20 mg Oral q1800  . cholecalciferol  1,000 Units Oral QPM  . dexamethasone  8 mg Oral Q8H  . feeding supplement (ENSURE ENLIVE)  237 mL Oral BID BM  . feeding supplement (GLUCERNA SHAKE)  237 mL Oral TID BM  . feeding supplement (PRO-STAT SUGAR FREE 64)  30 mL Oral BID  . insulin aspart  0-15 Units Subcutaneous Q4H  . insulin aspart   10 Units Subcutaneous Once  . insulin aspart  5 Units Subcutaneous TID WC  . insulin glargine  35 Units Subcutaneous QHS  . isosorbide mononitrate  60 mg Oral Daily  . metoprolol tartrate  25 mg Oral BID  . multivitamin with minerals  1 tablet Oral  QHS  . vitamin E  100 Units Oral QHS   Continuous Infusions:   LOS: 2 days    Time spent: 25 minutes spent in the coordination of care today.   Jonnie Finner, DO [facility] Pager [phone]  If 7PM-7AM, please contact night-coverage [email or web address] Password [facility] 06/07/2019, 3:38 PM

## 2019-06-07 NOTE — TOC Transition Note (Signed)
Transition of Care [facility]) - CM/SW Discharge Note   Patient Details  Name: Gregory Bates MRN: [id number] Date of Birth: 09/16/1941  Transition of Care [facility]) CM/SW Contact:  Wende Neighbors, LCSW Phone Number: 06/07/2019, 12:25 PM   Clinical Narrative:   Per patient spouse will pick him up once medically ready for dc. Patient has been set up with [facility]    Final next level of care: [place] Barriers to Discharge: No Barriers Identified   Patient Goals and CMS Choice Patient states their goals for this hospitalization and ongoing recovery are:: to be home with spouse CMS Medicare.gov Compare Post Acute Care list provided to:: Patient Choice offered to / list presented to : Patient  Discharge Placement                Patient to be transferred to facility by: spouse   Patient and family notified of of transfer: 06/07/19  Discharge Plan and Services In-house Referral: Clinical Social Work   Post Acute Care Choice: Home Health          DME Arranged: Patient refused services         HH Arranged: PT, OT [employer] Agency: [place] Date [place]: 06/07/19 Time [place]: 1219 Representative spoke with at [place]: [place] ([place]) Interventions     Readmission Risk Interventions No flowsheet data found.

## 2019-06-07 NOTE — Progress Notes (Signed)
    Patient is out of the room at radiation therapy.  We will check on him again in the morning.

## 2019-06-08 ENCOUNTER — Ambulatory Visit
Admit: 2019-06-08 | Discharge: 2019-06-08 | Disposition: A | Discharge status: Home / Self Care | Payer: Medicare HMO | Attending: Radiation Oncology | Admitting: Radiation Oncology

## 2019-06-08 LAB — RENAL FUNCTION PANEL
Albumin: 3.1 g/dL — ABNORMAL LOW (ref 3.5–5.0)
Anion gap: 9 (ref 5–15)
BUN: 31 mg/dL — ABNORMAL HIGH (ref 8–23)
CO2: 23 mmol/L (ref 22–32)
Calcium: 8.8 mg/dL — ABNORMAL LOW (ref 8.9–10.3)
Chloride: 102 mmol/L (ref 98–111)
Creatinine, Ser: 1.23 mg/dL (ref 0.61–1.24)
GFR calc Af Amer: >60 mL/min (ref >60)
GFR calc non Af Amer: 56 mL/min — ABNORMAL LOW (ref >60)
Glucose, Bld: 195 mg/dL — ABNORMAL HIGH (ref 70–99)
Phosphorus: 2.3 mg/dL — ABNORMAL LOW (ref 2.5–4.6)
Potassium: 4.6 mmol/L (ref 3.5–5.1)
Sodium: 134 mmol/L — ABNORMAL LOW (ref 135–145)

## 2019-06-08 LAB — CBC WITH DIFFERENTIAL/PLATELET
Abs Immature Granulocytes: 0.06 10*3/uL (ref 0.00–0.07)
Basophils Absolute: 0 10*3/uL (ref 0.0–0.1)
Basophils Relative: 0 %
Eosinophils Absolute: 0 10*3/uL (ref 0.0–0.5)
Eosinophils Relative: 0 %
HCT: 34.9 % — ABNORMAL LOW (ref 39.0–52.0)
Hemoglobin: 11.1 g/dL — ABNORMAL LOW (ref 13.0–17.0)
Immature Granulocytes: 1 %
Lymphocytes Relative: 7 %
Lymphs Abs: 0.7 10*3/uL (ref 0.7–4.0)
MCH: 28.6 pg (ref 26.0–34.0)
MCHC: 31.8 g/dL (ref 30.0–36.0)
MCV: 89.9 fL (ref 80.0–100.0)
Monocytes Absolute: 0.6 10*3/uL (ref 0.1–1.0)
Monocytes Relative: 6 %
Neutro Abs: 9 10*3/uL — ABNORMAL HIGH (ref 1.7–7.7)
Neutrophils Relative %: 86 %
Platelets: 198 10*3/uL (ref 150–400)
RBC: 3.88 MIL/uL — ABNORMAL LOW (ref 4.22–5.81)
RDW: 15.4 % (ref 11.5–15.5)
WBC: 10.4 10*3/uL (ref 4.0–10.5)
nRBC: 0 % (ref 0.0–0.2)

## 2019-06-08 LAB — GLUCOSE, CAPILLARY
Glucose-Capillary: 110 mg/dL — ABNORMAL HIGH (ref 70–99)
Glucose-Capillary: 168 mg/dL — ABNORMAL HIGH (ref 70–99)
Glucose-Capillary: 181 mg/dL — ABNORMAL HIGH (ref 70–99)
Glucose-Capillary: 249 mg/dL — ABNORMAL HIGH (ref 70–99)
Glucose-Capillary: 283 mg/dL — ABNORMAL HIGH (ref 70–99)

## 2019-06-08 LAB — MAGNESIUM: Magnesium: 2.3 mg/dL (ref 1.7–2.4)

## 2019-06-08 MED ORDER — INSULIN ASPART 100 UNIT/ML ~~LOC~~ SOLN: 10.0000 [IU] | Freq: Three times a day (TID) | SUBCUTANEOUS | 1 refills | Status: AC

## 2019-06-08 MED ORDER — FUROSEMIDE 20 MG PO TABS: 20.0000 mg | ORAL_TABLET | Freq: Every day | ORAL | 1 refills | Status: DC | PRN

## 2019-06-08 MED ORDER — INSULIN GLARGINE 100 UNIT/ML ~~LOC~~ SOLN
25.0000 [IU] | Freq: Every day | SUBCUTANEOUS | Status: DC
  Filled 2019-06-08: qty 0.25

## 2019-06-08 MED ORDER — INSULIN GLARGINE 100 UNIT/ML ~~LOC~~ SOLN: 25.0000 [IU] | Freq: Every day | SUBCUTANEOUS | 1 refills | Status: DC

## 2019-06-08 MED ORDER — INSULIN ASPART 100 UNIT/ML ~~LOC~~ SOLN
10.0000 [IU] | Freq: Three times a day (TID) | SUBCUTANEOUS | Status: DC
  Administered 2019-06-08 (×2): 10 [IU] via SUBCUTANEOUS

## 2019-06-08 MED ORDER — DEXAMETHASONE 4 MG PO TABS: 4.0000 mg | ORAL_TABLET | Freq: Four times a day (QID) | ORAL | 0 refills | Status: DC

## 2019-06-08 NOTE — Care Management Important Message (Signed)
Important Message  Patient Details IM Letter given to Rhea Pink SW to present to the Patient Name: Gregory Bates MRN: [id number] Date of Birth: Jan 25, 1941   Medicare Important Message Given:  Yes     Kerin Salen 06/08/2019, 10:58 AM

## 2019-06-08 NOTE — Progress Notes (Signed)
Hypoglycemic Event  CBG: 49  Treatment:15g carbohydrates  Symptoms: Diaphoretic   Follow-up CBG: Time:0021 CBG Result:110  Possible Reasons for Event:Increase of lantus   Comments/MD notified:[name]     Gregory Bates Angelica Ran

## 2019-06-08 NOTE — Discharge Summary (Signed)
. Physician Discharge Summary  Gregory Bates MOQ:[id number] DOB: 1941/03/20 DOA: 06/04/2019  PCP: Emmaline Kluver, MD  Admit date: 06/04/2019 Discharge date: 06/08/2019  Admitted From: Home Disposition:  Discharged to home with [facility]  Recommendations for Outpatient Follow-up:  1. Follow up with PCP in 1 weeks 2. Follow up with oncology as scheduled.  Discharge Condition: Stable  CODE STATUS: FULL    Brief/Interim Summary: Gregory Bates a 78 y.o.malewithhistory of extensive stage small cell lung cancer on palliative chemotherapy with history of CAD status post CABG last year, chronic diastolic CHF last EF measured in October 2019 was 13 to 50% with grade 2 diastolic dysfunction, chronic and disease stage III, diabetes mellitus type 2, anemia, paroxysmal atrial fibrillation was brought to the ER after patient had a syncopal episode. As per patient's grandson this is a second syncopal episode in the last 24 hours. Patient's last chemotherapy was last week. Over the last 1 week patient has had significant decline in his function and finds it very difficult to ambulate because of weakness. About a week ago patient was found weak in his hall at home and had incontinence of urine at that time. 24 hours ago patient was trying to walk to the bathroom when he passed out same thing happened today. Exact number of minutes patient pattern is not clear but did have incontinence of urine after the pass out and appeared confused. Per patient's grandson patient has been having some chest pain off and on for last few weeks. Patient did not have any nausea vomiting abdominal pain diarrhea though was treated for colitis in June with antibiotics. CT head was done which shows metastatic lesions with edema. On-call oncologist Dr. Izora Gala consulted by the ER physician and at this time oncology advised patient to be transferred to [facility] hospital. Decadron 8 mg IV every 8 was started.   Discharge  Diagnoses:  Principal Problem:   Metastatic cancer to brain [facility]) Active Problems:   Essential hypertension   Chronic diastolic CHF (congestive heart failure) (HCC)   PAF (paroxysmal atrial fibrillation) (HCC)   CKD (chronic kidney disease), stage III (HCC)   Normochromic normocytic anemia   S/P CABG x 3   Malignant neoplasm of bronchus of right upper lobe (HCC)   Chronic respiratory failure (HCC)   COPD (chronic obstructive pulmonary disease) (HCC)   Syncope   Metastatic small cell carcinoma to brain (HCC)  Metastatic brain lesion with vasogenic edema, small cell lung cancer     - CT head: new parenchymal cavitary masses with extensive vasogenic edema consistent with multifocal intracranial metastasis with central necrosis. Metastatic lesions include the high RIGHT frontal lobe, anterior LEFT frontal lobe, head of the RIGHT caudate nucleus, and LEFT Cerebellum.     - MRI brain: Widespread metastatic disease has developed since the prior MRI of 08/09/2018. At least 9 lesions are seen. There is a large amount of vasogenic edema in the left frontal lobe and right parietal lobe without midline shift. Solid enhancing lesion along the lateral sphenoid wing has grown in the interval and is most consistent with meningioma.     - oncology has seen; appreciate recs     - Continue Decadron; but decrease to 4mg  q6h per onco note      - Start radiation 06/07/2019   Chest pain     - CAD status post CABG     - EKG reviewed independently 8/4 which shows anterolateral T wave inversion that does not appear to  be new since 2019.      - Initially held off on cardiology consult since he had no complaints of chest pain on admission, but with new complaints 06/06/2019, cards consulted, appreciate assistance      - Continue aspirin, lipitor; plavix held by cardiology   Falls, ?syncope     - Secondary to dehydration, weakness. In discussing with wife, she denies any loss of consciousness but instead of  falling due to weakness       - Check orthostatic vital sign     - Continue telemetry     - PT/OT rec HH PT/OT; CM consulted  Paroxysmal atrial fibrillation     - Amiodarone, metoprolol, Eliquis  Diabetes mellitus type 2, poorly controlled     - a1c 10.8     - Lantus, sliding scale insulin.     - decadron not helping, insulin increased last night, but he went too low     - will discharge on lantus 25units qHS, novolog 10units TID WM, and sliding scale     - sliding scale: every 6 hours insulin aspart (novoLOG) injection 0-15 Units CBG 70 - 120: 0 units CBG 121 - 150: 2 units CBG 151 - 200: 3 units CBG 201 - 250: 5 units CBG 251 - 300: 8 units CBG 301 - 350: 11 units CBG 351 - 400: 15 units CBG > 400 call physician     - spoke with grandson by phone to give full details; he voiced understanding  Chronic respiratory failure secondary to COPD, extensive stage small cell lung cancer     - Stable currently  Chronic kidney disease stage III     - Baseline Cr 1.5-1.9; monitor  Chronic diastolic CHF     - Does not appear to be in exacerbation at this time, monitor closely     - lasix held by cards; to be used PRN  Discharge Instructions   Allergies as of 06/08/2019      Reactions   Brilinta [ticagrelor] Other (See Comments)   Arthralgias & myalgias   Crestor [rosuvastatin] Other (See Comments)   Myalgias      Medication List    STOP taking these medications   clopidogrel 75 MG tablet Commonly known as: PLAVIX   doxycycline 100 MG tablet Commonly known as: VIBRA-TABS   insulin NPH-regular Human (70-30) 100 UNIT/ML injection   predniSONE 20 MG tablet Commonly known as: DELTASONE     TAKE these medications   acetaminophen 325 MG tablet Commonly known as: TYLENOL Take 2 tablets (650 mg total) by mouth every 6 (six) hours as needed for mild pain.   albuterol 108 (90 Base) MCG/ACT inhaler Commonly known as: VENTOLIN HFA Inhale 2 puffs into the lungs every 6  (six) hours as needed for wheezing or shortness of breath.   amiodarone 200 MG tablet Commonly known as: PACERONE TAKE 1 TABLET (200 MG TOTAL) BY MOUTH DAILY. What changed: See the new instructions.   apixaban 5 MG Tabs tablet Commonly known as: Eliquis Take 1 tablet (5 mg total) by mouth 2 (two) times daily.   aspirin 81 MG EC tablet Take 1 tablet (81 mg total) by mouth daily.   atorvastatin 20 MG tablet Commonly known as: LIPITOR TAKE 1 TABLET EVERY DAY   cholecalciferol 1000 units tablet Commonly known as: VITAMIN D Take 1,000 Units by mouth every evening.   dexamethasone 4 MG tablet Commonly known as: DECADRON Take 1 tablet (4 mg total) by mouth every 6 (  six) hours.   fexofenadine 180 MG tablet Commonly known as: ALLEGRA Take 180 mg by mouth daily as needed for allergies.   fluticasone 50 MCG/ACT nasal spray Commonly known as: FLONASE Place 1 spray into both nostrils daily as needed for allergies.   furosemide 20 MG tablet Commonly known as: LASIX Take 1 tablet (20 mg total) by mouth daily as needed for fluid (If 3 - 5 pound fluid weight gain; take lasix and notify physician). What changed: See the new instructions.   guaiFENesin 600 MG 12 hr tablet Commonly known as: MUCINEX Take 1 tablet (600 mg total) by mouth 2 (two) times daily as needed for cough or to loosen phlegm.   insulin aspart 100 UNIT/ML injection Commonly known as: novoLOG Inject 10 Units into the skin 3 (three) times daily with meals.   insulin glargine 100 UNIT/ML injection Commonly known as: LANTUS Inject 0.25 mLs (25 Units total) into the skin at bedtime.   IRON COMPLEX PO Take 1 tablet by mouth at bedtime.   isosorbide mononitrate 60 MG 24 hr tablet Commonly known as: IMDUR TAKE 1 TABLET EVERY DAY   metFORMIN 1000 MG tablet Commonly known as: GLUCOPHAGE Take 500 mg by mouth daily with breakfast.   metoprolol tartrate 25 MG tablet Commonly known as: LOPRESSOR TAKE 1 TABLET TWICE  DAILY   multivitamin tablet Take 1 tablet by mouth at bedtime.   nitroGLYCERIN 0.4 MG SL tablet Commonly known as: NITROSTAT Place 1 tablet (0.4 mg total) under the tongue every 5 (five) minutes as needed for chest pain.   ondansetron 4 MG disintegrating tablet Commonly known as: Zofran ODT Take 1 tablet (4 mg total) by mouth every 8 (eight) hours as needed for nausea or vomiting.   prochlorperazine 10 MG tablet Commonly known as: COMPAZINE Take 1 tablet (10 mg total) by mouth every 6 (six) hours as needed for nausea or vomiting.   vitamin E 100 UNIT capsule Take 100 Units by mouth at bedtime.            Durable Medical Equipment  (From admission, onward)         Start     Ordered   06/07/19 1203  For home use only DME 3 n 1  Once     06/07/19 1203         Follow-up Information    Tommie Raymond, NP Follow up.   Specialty: Cardiology Why: Cardiology hospital follow up on 06/28/2019 at 11:15. Please arrive 15 minutes early for check in.  Contact information: [address] [place] [address] [phone]        [facility], [facility] Follow up.   Specialty: Home Health Services Why: Please follow up for Physical Therapy and Occupational Therapy  Contact information: [address] [place] [address] [phone]          Allergies  Allergen Reactions  . Brilinta [Ticagrelor] Other (See Comments)    Arthralgias & myalgias  . Crestor [Rosuvastatin] Other (See Comments)    Myalgias    Consultations:  Oncology  Cardiology   Procedures/Studies: Ct Head Wo Contrast  Result Date: 06/04/2019 CLINICAL DATA:  Fall, history of small cell lung cancer EXAM: CT HEAD WITHOUT CONTRAST TECHNIQUE: Contiguous axial images were obtained from the base of the skull through the vertex without intravenous contrast. COMPARISON:  Brain MRI 08/10/2018 FINDINGS: Brain: New large cavitary lesion in the high RIGHT frontal lobe with thickened  wall measures 3.9 x  3.0 cm. There is extensive vasogenic edema associated with this new large lesion. Similar lesion in the LEFT frontal lobe measuring 1.9 x 1.9 cm also with vasogenic edema. Smaller midline LEFT frontal lesion measuring 1.7 cm along the anterior interhemispheric fissure. Additional lesion in the LEFT cerebellum measuring 2.0 cm Lesion in the head of the RIGHT caudate nucleus is high-density measuring 1.7 cm. Basal cisterns are patent.  No hydrocephalus. No acute intracranial hemorrhage.  No intraventricular hemorrhage Vascular: No hyperdense vessel or unexpected calcification. Skull: Normal. Negative for fracture or focal lesion. Sinuses/Orbits: No acute finding. Other: IMPRESSION: 1. Unfortunately, there are new parenchymal cavitary masses with extensive vasogenic edema consistent with multifocal intracranial metastasis with central necrosis. 2. Metastatic lesions include the high RIGHT frontal lobe, anterior LEFT frontal lobe, head of the RIGHT caudate nucleus, and LEFT cerebellum. 3. No hydrocephalus. Basal cisterns patent. No intracranial hemorrhage , Findings [place] 06/04/2019  at18:16. Electronically Signed   By: Suzy Bouchard M.D.   On: 06/04/2019 18:17   Ct Chest Wo Contrast  Result Date: 05/25/2019 CLINICAL DATA:  Small-cell lung cancer.  Restaging. EXAM: CT CHEST WITHOUT CONTRAST TECHNIQUE: Multidetector CT imaging of the chest was performed following the standard protocol without IV contrast. COMPARISON:  04/14/2019 FINDINGS: Cardiovascular: The heart size is normal. No substantial pericardial effusion. Status post CABG. Atherosclerotic calcification is noted in the wall of the thoracic aorta. Mediastinum/Nodes: Scattered small mediastinal lymph nodes are stable. No evidence for gross hilar lymphadenopathy although assessment is limited by the lack of intravenous contrast on today's study. The esophagus has normal imaging features. There is no axillary  lymphadenopathy. Lungs/Pleura: The central tracheobronchial airways are patent. Centrilobular and paraseptal emphysema evident. Consolidative opacity in the anterior right upper lobe is similar to prior. The consolidative patchy opacity seen previously in the lower lobes bilaterally has decreased and become less confluent in the interval with areas of ground-glass attenuation now seen more more dense consolidative change was evident previously. Interval development of new patchy consolidative and ground-glass opacity noted in the posterior left upper lobe. No pleural effusion. Upper Abdomen: Unremarkable. Musculoskeletal: No worrisome lytic or sclerotic osseous abnormality. IMPRESSION: 1. Overall generalized trend towards improvement of the patchy bilateral consolidative airspace opacity seen on the previous study with some new disease noted in the posterior left upper lobe on the current exam. Imaging features may reflect a waxing/waning course of infectious/inflammatory etiology with drug toxicity not excluded. Recurrent disease considered less likely but not excluded. 2.  Aortic Atherosclerois (ICD10-170.0) 3.  Emphysema. ([id number]-E52.9) Electronically Signed   By: Misty Stanley M.D.   On: 05/25/2019 16:44   Mr Jeri Cos DP Contrast  Result Date: 06/05/2019 CLINICAL DATA:  Small cell lung cancer.  Abnormal CT head EXAM: MRI HEAD WITHOUT AND WITH CONTRAST TECHNIQUE: Multiplanar, multiecho pulse sequences of the brain and surrounding structures were obtained without and with intravenous contrast. CONTRAST:  7 mL Gadovist IV COMPARISON:  CT head 06/04/2019, MRI 08/10/2018 FINDINGS: Brain: Multiple enhancing lesions the brain compatible with metastatic disease. These were noted on the recent CT from yesterday but were not present on the prior MRI of 08/09/2018. Extensive edema in the left frontal lobe and right parietal lobe without midline shift. Left cerebellar rim enhancing mass with central necrosis measures 28 mm  with mild surrounding edema and mild hemorrhage. 6 x 15 mm lesion right anterior temporal lobe with mild edema 19 x 12 mm enhancing lesion right head of caudate with surrounding edema. 10 mm rim enhancing  lesion left thalamus with mild edema Large necrotic lesion right anterior occipital lobe measures 30 x 42 mm with extensive vasogenic edema and slight hemorrhage. 10 mm lesion right medial parietal lobe 19 mm left anterior frontal lobe 21 x 26 mm left frontal convexity lesion with extensive edema. 16 mm right lateral parietal lesion Solid enhancing lesion along the lateral sphenoid wing on the left has enlarged since the prior study and now measures 23 x 17 mm and shows susceptibility and is calcified on CT. This is consistent with meningioma which has grown in the interval. Coronal images demonstrate the this appears to be extra-axial. Vascular: Normal arterial flow voids Skull and upper cervical spine: No worrisome skeletal lesions. Sinuses/Orbits: Negative Other: None IMPRESSION: Widespread metastatic disease has developed since the prior MRI of 08/09/2018. At least 9 lesions are seen. There is a large amount of vasogenic edema in the left frontal lobe and right parietal lobe without midline shift. Solid enhancing lesion along the lateral sphenoid wing has grown in the interval and is most consistent with meningioma. Electronically Signed   By: Franchot Gallo M.D.   On: 06/05/2019 12:18   Dg Chest Port 1 View  Result Date: 06/04/2019 CLINICAL DATA:  Syncope.  History of lung carcinoma EXAM: PORTABLE CHEST 1 VIEW COMPARISON:  Chest CT May 25, 2019; chest radiograph September 16, 2019 FINDINGS: There are areas of ill-defined opacity in the right base, left base, and left mid lung. The apparent consolidation seen in the left mid lung on recent CT is not appreciable by radiography. There is underlying emphysematous change, better delineated on recent CT. Heart size and pulmonary vascularity are normal. No  adenopathy is identified by radiography. Patient is status post coronary artery bypass grafting. There is a left atrial appendage clamp present. There is aortic atherosclerosis. No bone lesions. IMPRESSION: Areas of ill-defined opacity in the lung bases and left mid lung regions. No consolidation or masses evident by radiography. Cardiac silhouette within normal limits. No adenopathy appreciable. Postoperative changes noted. Aortic Atherosclerosis (ICD10-I70.0) and Emphysema (ICD10-J43.9). Electronically Signed   By: Lowella Grip III M.D.   On: 06/04/2019 17:29       Subjective: "I can go home, right?"  Discharge Exam: Vitals:   06/08/19 0415 06/08/19 1331  BP: (!) 103/43 (!) 118/53  Pulse: (!) 50 62  Resp: 14 16  Temp: 97.9 F (36.6 C) 97.7 F (36.5 C)  SpO2: 98% 100%   Vitals:   06/07/19 1312 06/07/19 2013 06/08/19 0415 06/08/19 1331  BP: (!) 120/49 (!) 112/46 (!) 103/43 (!) 118/53  Pulse: (!) 52 (!) 49 (!) 50 62  Resp: 18 14 14 16   Temp: 97.7 F (36.5 C) 97.8 F (36.6 C) 97.9 F (36.6 C) 97.7 F (36.5 C)  TempSrc: Oral Oral Oral Oral  SpO2: 100% 100% 98% 100%  Weight:   71.6 kg   Height:        General: 78 y.o. male resting in chair in NAD Eyes: PERRL, normal sclera ENMT: Nares patent w/o discharge, orophaynx clear, dentition normal, ears w/o discharge/lesions/ulcers Cardiovascular: RRR, +S1, S2, no m/g/r, equal pulses throughout Respiratory: CTABL, no w/r/r, normal WOB GI: BS+, NDNT, no masses noted, no organomegaly noted MSK: No e/c/c Skin: No rashes, bruises, ulcerations noted Neuro: alert to name, follows commands Psyc: Appropriate interaction and affect, calm/cooperative    The results of significant diagnostics from this hospitalization (including imaging, microbiology, ancillary and laboratory) are listed below for reference.     Microbiology: Recent  Results (from the past 240 hour(s))  SARS CORONAVIRUS 2 Nasal Swab Aptima Multi Swab     Status:  None   Collection Time: 06/04/19  7:12 PM   Specimen: Aptima Multi Swab; Nasal Swab  Result Value Ref Range Status   SARS Coronavirus 2 NEGATIVE NEGATIVE Final    Comment: (NOTE) SARS-CoV-2 target nucleic acids are NOT DETECTED. The SARS-CoV-2 RNA is generally detectable in upper and lower respiratory specimens during the acute phase of infection. Negative results do not preclude SARS-CoV-2 infection, do not rule out co-infections with other pathogens, and should not be used as the sole basis for treatment or other patient management decisions. Negative results must be combined with clinical observations, patient history, and epidemiological information. The expected result is Negative. Fact Sheet for Patients: SugarRoll.be Fact Sheet for Healthcare Providers: https://www.woods-mathews.com/ This test is not yet approved or cleared by the [place] FDA and  has been authorized for detection and/or diagnosis of SARS-CoV-2 by FDA under an Emergency Use Authorization (EUA). This EUA will remain  in effect (meaning this test can be used) for the duration of the COVID-19 declaration under Section 56 4(b)(1) of the Act, 21 U.S.C. section 360bbb-3(b)(1), unless the authorization is terminated or revoked sooner. Performed at [facility], [place] [address] [place], [place] [address]      Labs: BNP (last 3 results) Recent Labs    07/24/18 1118 08/09/18 0255 08/13/18 0109  BNP 121.9* 507.8* 657.8*   Basic Metabolic Panel: Recent Labs  Lab 06/04/19 1725 06/05/19 0546 06/06/19 0438 06/07/19 0511 06/08/19 0456  NA 132* 134* 134* 132* 134*  K 4.7 4.2 4.4 4.3 4.6  CL 95* 99 102 102 102  CO2 25 24 24  21* 23  GLUCOSE 428* 220* 180* 179* 195*  BUN 21 22 25* 32* 31*  CREATININE 1.70* 1.35* 1.12 1.14 1.23  CALCIUM 9.5 9.1 9.0 8.8* 8.8*  MG  --   --   --   --  2.3  PHOS  --   --   --   --  2.3*   Liver Function Tests: Recent Labs   Lab 06/05/19 0546 06/08/19 0456  AST 21  --   ALT 20  --   ALKPHOS 86  --   BILITOT 0.5  --   PROT 7.0  --   ALBUMIN 3.6 3.1*   No results for input(s): LIPASE, AMYLASE in the last 168 hours. No results for input(s): AMMONIA in the last 168 hours. CBC: Recent Labs  Lab 06/04/19 1725 06/05/19 0546 06/06/19 0438 06/07/19 0511 06/08/19 0456  WBC 9.8 7.2 11.6* 12.4* 10.4  NEUTROABS  --  6.3  --   --  9.0*  HGB 11.9* 11.6* 10.5* 10.7* 11.1*  HCT 36.9* 37.3* 32.6* 33.8* 34.9*  MCV 88.7 90.5 90.1 90.1 89.9  PLT 217 236 200 240 198   Cardiac Enzymes: No results for input(s): CKTOTAL, CKMB, CKMBINDEX, TROPONINI in the last 168 hours. BNP: Invalid input(s): POCBNP CBG: Recent Labs  Lab 06/07/19 2347 06/08/19 0021 06/08/19 0411 06/08/19 0836 06/08/19 1127  GLUCAP 49* 110* 181* 168* 283*   D-Dimer No results for input(s): DDIMER in the last 72 hours. Hgb A1c No results for input(s): HGBA1C in the last 72 hours. Lipid Profile No results for input(s): CHOL, HDL, LDLCALC, TRIG, CHOLHDL, LDLDIRECT in the last 72 hours. Thyroid function studies No results for input(s): TSH, T4TOTAL, T3FREE, THYROIDAB in the last 72 hours.  Invalid input(s): FREET3 Anemia work up No  results for input(s): VITAMINB12, FOLATE, FERRITIN, TIBC, IRON, RETICCTPCT in the last 72 hours. Urinalysis    Component Value Date/Time   COLORURINE STRAW (A) 06/04/2019 1711   APPEARANCEUR CLEAR 06/04/2019 1711   LABSPEC 1.016 06/04/2019 1711   PHURINE 5.0 06/04/2019 1711   GLUCOSEU >=500 (A) 06/04/2019 1711   HGBUR SMALL (A) 06/04/2019 1711   BILIRUBINUR NEGATIVE 06/04/2019 1711   KETONESUR NEGATIVE 06/04/2019 1711   PROTEINUR NEGATIVE 06/04/2019 1711   NITRITE NEGATIVE 06/04/2019 1711   LEUKOCYTESUR SMALL (A) 06/04/2019 1711   Sepsis Labs Invalid input(s): PROCALCITONIN,  WBC,  LACTICIDVEN Microbiology Recent Results (from the past 240 hour(s))  SARS CORONAVIRUS 2 Nasal Swab Aptima Multi Swab      Status: None   Collection Time: 06/04/19  7:12 PM   Specimen: Aptima Multi Swab; Nasal Swab  Result Value Ref Range Status   SARS Coronavirus 2 NEGATIVE NEGATIVE Final    Comment: (NOTE) SARS-CoV-2 target nucleic acids are NOT DETECTED. The SARS-CoV-2 RNA is generally detectable in upper and lower respiratory specimens during the acute phase of infection. Negative results do not preclude SARS-CoV-2 infection, do not rule out co-infections with other pathogens, and should not be used as the sole basis for treatment or other patient management decisions. Negative results must be combined with clinical observations, patient history, and epidemiological information. The expected result is Negative. Fact Sheet for Patients: SugarRoll.be Fact Sheet for Healthcare Providers: https://www.woods-mathews.com/ This test is not yet approved or cleared by the [place] FDA and  has been authorized for detection and/or diagnosis of SARS-CoV-2 by FDA under an Emergency Use Authorization (EUA). This EUA will remain  in effect (meaning this test can be used) for the duration of the COVID-19 declaration under Section 56 4(b)(1) of the Act, 21 U.S.C. section 360bbb-3(b)(1), unless the authorization is terminated or revoked sooner. Performed at [facility], [place] [address] [place], [place] [address]      Time coordinating discharge: 35 minutes  SIGNED:   Jonnie Finner, DO  [facility] 06/08/2019, 4:11 PM Pager   If 7PM-7AM, please contact night-coverage [email or web address] Password [email or web address]

## 2019-06-08 NOTE — Progress Notes (Signed)
Physical Therapy Treatment Patient Details Name: Gregory Bates MRN: [id number] DOB: [birth year] Today's Date: 06/08/2019    History of Present Illness 78 y.o. male with history of extensive stage small cell lung cancer on palliative chemotherapy with history of CAD status post CABG last year, chronic diastolic CHF last EF measured in October 2019 was 13 to 50% with grade 2 diastolic dysfunction, chronic and disease stage III, diabetes mellitus type 2, anemia, paroxysmal atrial fibrillation was brought to the ER after patient had a syncopal episode.  Pt found to have Metastatic brain lesion with vasogenic edema    PT Comments    Patient progressing well with mobility. Patient able to perform sit to stand with supervision to mod independent level with RW from toilet today. He progressed gait with RW to supervision level and demonstrated safe hand placement on RW. Pt continues to require cues to maintain straight path during ambulation and continues with tendency to drift to the Rt; this improved with visual cues. Overall patient is progressing well and will continue to benefit from skilled PT interventions to address ongoing impairments.    Follow Up Recommendations  Home health PT;Supervision/Assistance - 24 hour     Equipment Recommendations  None recommended by PT    Recommendations for Other Services       Precautions / Restrictions Precautions Precautions: Fall Restrictions Weight Bearing Restrictions: No    Mobility  Bed Mobility Overal bed mobility: Needs Assistance Bed Mobility: Supine to Sit     Supine to sit: Min assist     General bed mobility comments: pt up in recliner on arrival  Transfers Overall transfer level: Needs assistance Equipment used: Rolling walker (2 wheeled) Transfers: Sit to/from Stand Sit to Stand: Supervision;Modified independent (Device/Increase time) Stand pivot transfers: Min assist       General transfer comment: pt requried supervision  for sit to stand transfer from recliner and was mod I wtih sit to stand transfer from toilet  Ambulation/Gait Ambulation/Gait assistance: Supervision Gait Distance (Feet): 200 Feet Assistive device: Rolling walker (2 wheeled) Gait Pattern/deviations: Step-through pattern;Decreased stride length Gait velocity: decr   General Gait Details: pt with good steadiness using RW, cues throughout to deter veering to the Rt and pt improved walkign in straight line with visual cues/targets on the floor   Stairs             Wheelchair Mobility    Modified Rankin (Stroke Patients Only)       Balance Overall balance assessment: Mild deficits observed, not formally tested;Needs assistance         Standing balance support: Bilateral upper extremity supported Standing balance-Leahy Scale: Fair Standing balance comment: pt able to tolerate standing without UE support to don face mask and perform hand hygiene                            Cognition Arousal/Alertness: Awake/alert Behavior During Therapy: WFL for tasks assessed/performed Overall Cognitive Status: Within Functional Limits for tasks assessed                          Pertinent Vitals/Pain Pain Assessment: No/denies pain           PT Goals (current goals can now be found in the care plan section) Acute Rehab PT Goals Patient Stated Goal: get out of hospital PT Goal Formulation: With patient Time For Goal Achievement: 06/20/19 Potential to Achieve Goals: Good  Frequency    Min 3X/week      PT Plan         AM-PAC PT "6 Clicks" Mobility   Outcome Measure  Help needed turning from your back to your side while in a flat bed without using bedrails?: A Little Help needed moving from lying on your back to sitting on the side of a flat bed without using bedrails?: A Little Help needed moving to and from a bed to a chair (including a wheelchair)?: A Little Help needed standing up from a chair  using your arms (e.g., wheelchair or bedside chair)?: A Little Help needed to walk in hospital room?: A Little Help needed climbing 3-5 steps with a railing? : A Little 6 Click Score: 18    End of Session Equipment Utilized During Treatment: Gait belt Activity Tolerance: Patient tolerated treatment well Patient left: in chair;with call bell/phone within reach;with family/visitor present Nurse Communication: Mobility status PT Visit Diagnosis: Other abnormalities of gait and mobility (R26.89);Unsteadiness on feet (R26.81)     Time: 2297-9892 PT Time Calculation (min) (ACUTE ONLY): 18 min  Charges:  $Gait Training: 8-22 mins                     Kipp Brood, PT, DPT, [facility] Physical Therapist with [facility]  06/08/2019 4:09 PM

## 2019-06-08 NOTE — Progress Notes (Addendum)
Progress Note  Patient Name: Gregory Bates Date of Encounter: 06/08/2019  Primary Cardiologist: Lauree Chandler, MD   Subjective   Doing well. Sitting in chair eating breakfast. Denies any recurrent epigastric pain. No chest pain. No dyspnea. No further syncope. Asking if he can go home today.   Inpatient Medications    Scheduled Meds: . amiodarone  200 mg Oral Daily  . apixaban  5 mg Oral BID  . aspirin EC  81 mg Oral Daily  . atorvastatin  20 mg Oral q1800  . cholecalciferol  1,000 Units Oral QPM  . dexamethasone  4 mg Oral Q6H  . feeding supplement (GLUCERNA SHAKE)  237 mL Oral TID BM  . feeding supplement (PRO-STAT SUGAR FREE 64)  30 mL Oral BID  . insulin aspart  0-15 Units Subcutaneous Q4H  . insulin aspart  10 Units Subcutaneous TID WC  . insulin glargine  25 Units Subcutaneous QHS  . isosorbide mononitrate  60 mg Oral Daily  . metoprolol tartrate  25 mg Oral BID  . multivitamin with minerals  1 tablet Oral QHS  . vitamin E  100 Units Oral QHS   Continuous Infusions:  PRN Meds: acetaminophen **OR** acetaminophen, albuterol, nitroGLYCERIN, ondansetron **OR** ondansetron (ZOFRAN) IV   Vital Signs    Vitals:   06/07/19 1059 06/07/19 1312 06/07/19 2013 06/08/19 0415  BP: (!) 118/50 (!) 120/49 (!) 112/46 (!) 103/43  Pulse: 62 (!) 52 (!) 49 (!) 50  Resp:  18 14 14   Temp:  97.7 F (36.5 C) 97.8 F (36.6 C) 97.9 F (36.6 C)  TempSrc:  Oral Oral Oral  SpO2:  100% 100% 98%  Weight:    71.6 kg  Height:        Intake/Output Summary (Last 24 hours) at 06/08/2019 0827 Last data filed at 06/08/2019 0200 Gross per 24 hour  Intake 840 ml  Output 100 ml  Net 740 ml   Last 3 Weights 06/08/2019 06/07/2019 06/06/2019  Weight (lbs) 157 lb 12.8 oz 158 lb 8.2 oz 155 lb 3.2 oz  Weight (kg) 71.578 kg 71.9 kg 70.398 kg      Telemetry    NSR 72 bpm - Personally Reviewed  ECG    Not performed today- Personally Reviewed  Physical Exam   GEN: Elderly WM, in No acute  distress.   Neck: No JVD Cardiac: RRR, no murmurs, rubs, or gallops.  Respiratory: Clear to auscultation bilaterally. GI: Soft, nontender, non-distended  MS: No edema; No deformity. Neuro:  Nonfocal  Psych: Normal affect   Labs    High Sensitivity Troponin:   Recent Labs  Lab 06/04/19 1725 06/04/19 1906 06/05/19 0634 06/06/19 1242 06/06/19 1448  TROPONINIHS 50* 54* 28* 12 20*      Cardiac EnzymesNo results for input(s): TROPONINI in the last 168 hours. No results for input(s): TROPIPOC in the last 168 hours.   Chemistry Recent Labs  Lab 06/05/19 0546 06/06/19 0438 06/07/19 0511 06/08/19 0456  NA 134* 134* 132* 134*  K 4.2 4.4 4.3 4.6  CL 99 102 102 102  CO2 24 24 21* 23  GLUCOSE 220* 180* 179* 195*  BUN 22 25* 32* 31*  CREATININE 1.35* 1.12 1.14 1.23  CALCIUM 9.1 9.0 8.8* 8.8*  PROT 7.0  --   --   --   ALBUMIN 3.6  --   --  3.1*  AST 21  --   --   --   ALT 20  --   --   --  ALKPHOS 86  --   --   --   BILITOT 0.5  --   --   --   GFRNONAA 50* >60 >60 56*  GFRAA 58* >60 >60 >60  ANIONGAP 11 8 9 9      Hematology Recent Labs  Lab 06/06/19 0438 06/07/19 0511 06/08/19 0456  WBC 11.6* 12.4* 10.4  RBC 3.62* 3.75* 3.88*  HGB 10.5* 10.7* 11.1*  HCT 32.6* 33.8* 34.9*  MCV 90.1 90.1 89.9  MCH 29.0 28.5 28.6  MCHC 32.2 31.7 31.8  RDW 14.9 15.2 15.4  PLT 200 240 198    BNPNo results for input(s): BNP, PROBNP in the last 168 hours.   DDimer No results for input(s): DDIMER in the last 168 hours.   Radiology    No results found.  Cardiac Studies   2D  Echo 07/29/19 Study Conclusions  - Left ventricle: The cavity size was normal. There was moderate   concentric hypertrophy. Systolic function was normal. The   estimated ejection fraction was in the range of 55% to 60%. Wall   motion was normal; there were no regional wall motion   abnormalities. Features are consistent with a pseudonormal left   ventricular filling pattern, with concomitant abnormal  relaxation   and increased filling pressure (grade 2 diastolic dysfunction).   Doppler parameters are consistent with elevated ventricular   end-diastolic filling pressure. - Aortic valve: Trileaflet; moderately thickened, moderately   calcified leaflets. There was mild regurgitation. - Aortic root: The aortic root was normal in size. - Mitral valve: There was mild regurgitation. - Left atrium: The atrium was mildly dilated. - Right ventricle: The cavity size was normal. Wall thickness was   normal. Systolic function was normal. - Tricuspid valve: There was mild regurgitation. - Pulmonic valve: There was no regurgitation. - Pulmonary arteries: Systolic pressure was mildly increased. PA   peak pressure: 31 mm Hg (S). - Inferior vena cava: The vessel was normal in size. - Pericardium, extracardiac: There was no pericardial effusion.  Impressions:  - There is hypokinesis of the basal inferior and inefrolateral   walls but other walls are hyperdynamic, overall LVEF 55-60%. This   is improved from the prior study on 12/23/2015 (45-50%)  Patient Profile     34M w/ CAD s/p STEMI 2013 & NSTEMI 2014 w/PCI and CABG with LA clip in 0/1751, chronic diastolic HF with EF 02-58% and G2DD, PAF on Eliquis and amiodarone, HTN, HLD, DM type 2, CKD stage 3 and lung cancer who presented with syncope and found to have metastatic brain lesions.  Also with AKI that resolved with IV fluids. Cardiology was consulted for epigastric pain,  abnormal EKG and elevated Hs Troponin.   Assessment & Plan    1. Elevated Hs Troponin: 50>>54>>28>>12>>20  Enzyme trend is low level and down trending, not c/w ACS  2. Epigastric Pain: pt reported his recent epigastric pain was not similar to his CP associated with previous MIs. EKGs however did show more pronounced ST depressions/TWI, however enzyme trend argues against acute MI. No further pain. No plans for additional w/u. Will continue medical management of CAD.   3.  CAD: s/p STEMI 2013 & NSTEMI 2014 w/PCI and CABG 07/2018.  Chest Pain Free  Continue medical therapy  Plavix discontinued, and he will continue on ASA and Eliquis  Continue statin,  blocker and LA nitrate  4. PAF: NSR on admit EKG and tele. HR controlled.   Continue AAD therapy w/ Amiodarone  Continue Eliquis for  anticoagulation/ stroke prophylaxis   5. Lung Ca w/ Brain Mets: management per oncology  Radiation therapy   6. Syncope: likely secondary to dehydration and weakness. Not felt to be cardiogenic. No recurrence.   Stable from a cardiac standpoint. Suspect he may be discharged later today. MD to follow with final recommendations.   For questions or updates, please contact [place] Please consult [email or web address] for contact info under        Signed, Lyda Jester, PA-C  06/08/2019, 8:27 AM    History and all data above reviewed.  Patient examined.  I agree with the findings as above.  Denies chest pain.  No SOB.   The patient exam reveals COR:RRR  ,  Lungs: Clear  ,  Abd: Positive bowel sounds, no rebound no guarding, Ext No edema  .  All available labs, radiology testing, previous records reviewed. Agree with documented assessment and plan. Chest pain:  Non evidence of angina.  No further work up.     [facility] will sign off.   Medication Recommendations:  Meds as on MAR Other recommendations (labs, testing, etc):  None  Follow up as an outpatient:  As needed   Minus Breeding  12:20 PM  06/08/2019

## 2019-06-08 NOTE — Progress Notes (Signed)
Occupational Therapy Treatment Patient Details Name: Gregory Bates MRN: [id number] DOB: 1941/03/05 Today's Date: 06/08/2019    History of present illness 78 y.o. male with history of extensive stage small cell lung cancer on palliative chemotherapy with history of CAD status post CABG last year, chronic diastolic CHF last EF measured in October 2019 was 11 to 50% with grade 2 diastolic dysfunction, chronic and disease stage III, diabetes mellitus type 2, anemia, paroxysmal atrial fibrillation was brought to the ER after patient had a syncopal episode.  Pt found to have Metastatic brain lesion with vasogenic edema   OT comments  Pt looking forward to getting home.  Wife will A as needed.   Follow Up Recommendations  Home health OT;Supervision/Assistance - 24 hour    Equipment Recommendations  3 in 1 bedside commode    Recommendations for Other Services      Precautions / Restrictions Precautions Precautions: Fall       Mobility Bed Mobility Overal bed mobility: Needs Assistance Bed Mobility: Supine to Sit     Supine to sit: Min assist        Transfers Overall transfer level: Needs assistance Equipment used: Rolling walker (2 wheeled) Transfers: Sit to/from [facility] Sit to Stand: Min assist Stand pivot transfers: Min assist            Balance Overall balance assessment: Mild deficits observed, not formally tested;Needs assistance         Standing balance support: Bilateral upper extremity supported Standing balance-Leahy Scale: Poor                             ADL either performed or assessed with clinical judgement   ADL Overall ADL's : Needs assistance/impaired     Grooming: Oral care;Wash/dry hands;Standing;Supervision/safety           Upper Body Dressing : Set up;Sitting   Lower Body Dressing: Minimal assistance;Cueing for compensatory techniques;Cueing for safety;Cueing for sequencing;Sit to/from stand   Toilet  Transfer: Supervision/safety;Cueing for [employer];Ambulation   Toileting- Clothing Manipulation and Hygiene: Supervision/safety;Sit to/from stand;Cueing for compensatory techniques;Cueing for safety;Cueing for sequencing         General ADL Comments: Pt with increased participation this day.  Pt looking forward to going home. Wife will A with ADL activity as needed     Vision Patient Visual Report: No change from baseline            Cognition Arousal/Alertness: Awake/alert Behavior During Therapy: WFL for tasks assessed/performed Overall Cognitive Status: Within Functional Limits for tasks assessed                                                     Pertinent Vitals/ Pain       Pain Assessment: No/denies pain         Frequency  Min 2X/week        Progress Toward Goals  OT Goals(current goals can now be found in the care plan section)  Progress towards OT goals: Progressing toward goals     Plan Discharge plan remains appropriate       AM-PAC OT "6 Clicks" Daily Activity     Outcome Measure   Help from another person eating meals?: None Help from another person taking care of personal grooming?: A  Little Help from another person toileting, which includes using toliet, bedpan, or urinal?: A Little Help from another person bathing (including washing, rinsing, drying)?: A Little Help from another person to put on and taking off regular upper body clothing?: A Little Help from another person to put on and taking off regular lower body clothing?: A Little 6 Click Score: 19    End of Session Equipment Utilized During Treatment: Rolling walker;Gait belt  OT Visit Diagnosis: Unsteadiness on feet (R26.81);Muscle weakness (generalized) (M62.81)   Activity Tolerance Patient tolerated treatment well   Patient Left in chair;with call bell/phone within reach   Nurse Communication Mobility status        Time: 6815-9470 OT Time  Calculation (min): 24 min  Charges: OT General Charges $OT Visit: 1 Visit OT Treatments $Self Care/Home Management : 23-37 mins       Kijana Estock, Thereasa Parkin 06/08/2019, 1:28 PM

## 2019-06-09 ENCOUNTER — Ambulatory Visit
Admission: RE | Admit: 2019-06-09 | Discharge: 2019-06-09 | Disposition: A | Discharge status: Home / Self Care | Payer: Medicare HMO | Source: Ambulatory Visit | Attending: Radiation Oncology | Admitting: Radiation Oncology

## 2019-06-09 ENCOUNTER — Other Ambulatory Visit: Payer: Self-pay

## 2019-06-09 DIAGNOSIS — Z510 Encounter for antineoplastic radiation therapy: Secondary | ICD-10-CM | POA: Diagnosis not present

## 2019-06-09 DIAGNOSIS — C7931 Secondary malignant neoplasm of brain: Secondary | ICD-10-CM | POA: Diagnosis not present

## 2019-06-09 DIAGNOSIS — M170 Bilateral primary osteoarthritis of knee: Secondary | ICD-10-CM | POA: Diagnosis not present

## 2019-06-09 DIAGNOSIS — I130 Hypertensive heart and chronic kidney disease with heart failure and stage 1 through stage 4 chronic kidney disease, or unspecified chronic kidney disease: Secondary | ICD-10-CM | POA: Diagnosis not present

## 2019-06-09 DIAGNOSIS — I0981 Rheumatic heart failure: Secondary | ICD-10-CM | POA: Diagnosis not present

## 2019-06-09 DIAGNOSIS — C3411 Malignant neoplasm of upper lobe, right bronchus or lung: Secondary | ICD-10-CM | POA: Insufficient documentation

## 2019-06-09 DIAGNOSIS — D630 Anemia in neoplastic disease: Secondary | ICD-10-CM | POA: Diagnosis not present

## 2019-06-09 DIAGNOSIS — M19071 Primary osteoarthritis, right ankle and foot: Secondary | ICD-10-CM | POA: Diagnosis not present

## 2019-06-09 DIAGNOSIS — I5042 Chronic combined systolic (congestive) and diastolic (congestive) heart failure: Secondary | ICD-10-CM | POA: Diagnosis not present

## 2019-06-09 DIAGNOSIS — M19072 Primary osteoarthritis, left ankle and foot: Secondary | ICD-10-CM | POA: Diagnosis not present

## 2019-06-12 ENCOUNTER — Ambulatory Visit
Admission: RE | Admit: 2019-06-12 | Discharge: 2019-06-12 | Disposition: A | Discharge status: Home / Self Care | Payer: Medicare HMO | Source: Ambulatory Visit | Attending: Radiation Oncology | Admitting: Radiation Oncology

## 2019-06-12 ENCOUNTER — Other Ambulatory Visit: Payer: Self-pay

## 2019-06-12 DIAGNOSIS — I130 Hypertensive heart and chronic kidney disease with heart failure and stage 1 through stage 4 chronic kidney disease, or unspecified chronic kidney disease: Secondary | ICD-10-CM | POA: Diagnosis not present

## 2019-06-12 DIAGNOSIS — I5042 Chronic combined systolic (congestive) and diastolic (congestive) heart failure: Secondary | ICD-10-CM | POA: Diagnosis not present

## 2019-06-12 DIAGNOSIS — D630 Anemia in neoplastic disease: Secondary | ICD-10-CM | POA: Diagnosis not present

## 2019-06-12 DIAGNOSIS — M170 Bilateral primary osteoarthritis of knee: Secondary | ICD-10-CM | POA: Diagnosis not present

## 2019-06-12 DIAGNOSIS — Z510 Encounter for antineoplastic radiation therapy: Secondary | ICD-10-CM | POA: Diagnosis not present

## 2019-06-12 DIAGNOSIS — M19071 Primary osteoarthritis, right ankle and foot: Secondary | ICD-10-CM | POA: Diagnosis not present

## 2019-06-12 DIAGNOSIS — I0981 Rheumatic heart failure: Secondary | ICD-10-CM | POA: Diagnosis not present

## 2019-06-12 DIAGNOSIS — M19072 Primary osteoarthritis, left ankle and foot: Secondary | ICD-10-CM | POA: Diagnosis not present

## 2019-06-12 DIAGNOSIS — C3411 Malignant neoplasm of upper lobe, right bronchus or lung: Secondary | ICD-10-CM | POA: Diagnosis not present

## 2019-06-12 DIAGNOSIS — C7931 Secondary malignant neoplasm of brain: Secondary | ICD-10-CM | POA: Diagnosis not present

## 2019-06-13 ENCOUNTER — Other Ambulatory Visit: Payer: Self-pay

## 2019-06-13 ENCOUNTER — Ambulatory Visit
Admission: RE | Admit: 2019-06-13 | Discharge: 2019-06-13 | Disposition: A | Discharge status: Home / Self Care | Payer: Medicare HMO | Source: Ambulatory Visit | Attending: Radiation Oncology | Admitting: Radiation Oncology

## 2019-06-13 DIAGNOSIS — M19071 Primary osteoarthritis, right ankle and foot: Secondary | ICD-10-CM | POA: Diagnosis not present

## 2019-06-13 DIAGNOSIS — Z510 Encounter for antineoplastic radiation therapy: Secondary | ICD-10-CM | POA: Diagnosis not present

## 2019-06-13 DIAGNOSIS — I0981 Rheumatic heart failure: Secondary | ICD-10-CM | POA: Diagnosis not present

## 2019-06-13 DIAGNOSIS — C3411 Malignant neoplasm of upper lobe, right bronchus or lung: Secondary | ICD-10-CM | POA: Diagnosis not present

## 2019-06-13 DIAGNOSIS — M170 Bilateral primary osteoarthritis of knee: Secondary | ICD-10-CM | POA: Diagnosis not present

## 2019-06-13 DIAGNOSIS — D630 Anemia in neoplastic disease: Secondary | ICD-10-CM | POA: Diagnosis not present

## 2019-06-13 DIAGNOSIS — M19072 Primary osteoarthritis, left ankle and foot: Secondary | ICD-10-CM | POA: Diagnosis not present

## 2019-06-13 DIAGNOSIS — C7931 Secondary malignant neoplasm of brain: Secondary | ICD-10-CM | POA: Diagnosis not present

## 2019-06-13 DIAGNOSIS — I5042 Chronic combined systolic (congestive) and diastolic (congestive) heart failure: Secondary | ICD-10-CM | POA: Diagnosis not present

## 2019-06-13 DIAGNOSIS — I130 Hypertensive heart and chronic kidney disease with heart failure and stage 1 through stage 4 chronic kidney disease, or unspecified chronic kidney disease: Secondary | ICD-10-CM | POA: Diagnosis not present

## 2019-06-14 ENCOUNTER — Other Ambulatory Visit: Payer: Self-pay

## 2019-06-14 ENCOUNTER — Ambulatory Visit
Admission: RE | Admit: 2019-06-14 | Discharge: 2019-06-14 | Disposition: A | Discharge status: Home / Self Care | Payer: Medicare HMO | Source: Ambulatory Visit | Attending: Radiation Oncology | Admitting: Radiation Oncology

## 2019-06-14 DIAGNOSIS — Z510 Encounter for antineoplastic radiation therapy: Secondary | ICD-10-CM | POA: Diagnosis not present

## 2019-06-14 DIAGNOSIS — C3411 Malignant neoplasm of upper lobe, right bronchus or lung: Secondary | ICD-10-CM | POA: Diagnosis not present

## 2019-06-14 DIAGNOSIS — C7931 Secondary malignant neoplasm of brain: Secondary | ICD-10-CM | POA: Diagnosis not present

## 2019-06-15 ENCOUNTER — Other Ambulatory Visit: Payer: Self-pay

## 2019-06-15 ENCOUNTER — Ambulatory Visit
Admission: RE | Admit: 2019-06-15 | Discharge: 2019-06-15 | Disposition: A | Discharge status: Home / Self Care | Payer: Medicare HMO | Source: Ambulatory Visit | Attending: Radiation Oncology | Admitting: Radiation Oncology

## 2019-06-15 DIAGNOSIS — C7931 Secondary malignant neoplasm of brain: Secondary | ICD-10-CM | POA: Diagnosis not present

## 2019-06-15 DIAGNOSIS — Z510 Encounter for antineoplastic radiation therapy: Secondary | ICD-10-CM | POA: Diagnosis not present

## 2019-06-15 DIAGNOSIS — C3411 Malignant neoplasm of upper lobe, right bronchus or lung: Secondary | ICD-10-CM | POA: Diagnosis not present

## 2019-06-16 ENCOUNTER — Ambulatory Visit
Admission: RE | Admit: 2019-06-16 | Discharge: 2019-06-16 | Disposition: A | Discharge status: Home / Self Care | Payer: Medicare HMO | Source: Ambulatory Visit | Attending: Radiation Oncology | Admitting: Radiation Oncology

## 2019-06-16 ENCOUNTER — Other Ambulatory Visit: Payer: Self-pay

## 2019-06-16 DIAGNOSIS — C7931 Secondary malignant neoplasm of brain: Secondary | ICD-10-CM | POA: Diagnosis not present

## 2019-06-16 DIAGNOSIS — C3411 Malignant neoplasm of upper lobe, right bronchus or lung: Secondary | ICD-10-CM | POA: Diagnosis not present

## 2019-06-16 DIAGNOSIS — Z510 Encounter for antineoplastic radiation therapy: Secondary | ICD-10-CM | POA: Diagnosis not present

## 2019-06-19 ENCOUNTER — Ambulatory Visit: Payer: Medicare HMO

## 2019-06-19 ENCOUNTER — Ambulatory Visit: Payer: Medicare HMO | Admitting: Internal Medicine

## 2019-06-19 ENCOUNTER — Other Ambulatory Visit: Payer: Self-pay

## 2019-06-19 ENCOUNTER — Ambulatory Visit
Admission: RE | Admit: 2019-06-19 | Discharge: 2019-06-19 | Disposition: A | Discharge status: Home / Self Care | Payer: Medicare HMO | Source: Ambulatory Visit | Attending: Radiation Oncology | Admitting: Radiation Oncology

## 2019-06-19 ENCOUNTER — Other Ambulatory Visit: Payer: Medicare HMO

## 2019-06-19 DIAGNOSIS — Z510 Encounter for antineoplastic radiation therapy: Secondary | ICD-10-CM | POA: Diagnosis not present

## 2019-06-19 DIAGNOSIS — C3411 Malignant neoplasm of upper lobe, right bronchus or lung: Secondary | ICD-10-CM | POA: Diagnosis not present

## 2019-06-19 DIAGNOSIS — C7931 Secondary malignant neoplasm of brain: Secondary | ICD-10-CM | POA: Diagnosis not present

## 2019-06-20 ENCOUNTER — Ambulatory Visit
Admission: RE | Admit: 2019-06-20 | Discharge: 2019-06-20 | Disposition: A | Discharge status: Home / Self Care | Payer: Medicare HMO | Source: Ambulatory Visit | Attending: Radiation Oncology | Admitting: Radiation Oncology

## 2019-06-20 ENCOUNTER — Other Ambulatory Visit: Payer: Medicare HMO

## 2019-06-20 ENCOUNTER — Ambulatory Visit: Payer: Medicare HMO

## 2019-06-20 ENCOUNTER — Other Ambulatory Visit: Payer: Self-pay

## 2019-06-20 ENCOUNTER — Encounter: Payer: Self-pay | Admitting: Radiation Oncology

## 2019-06-20 ENCOUNTER — Ambulatory Visit: Payer: Medicare HMO | Admitting: Physician Assistant

## 2019-06-20 ENCOUNTER — Other Ambulatory Visit: Payer: Self-pay | Admitting: Radiation Oncology

## 2019-06-20 DIAGNOSIS — I0981 Rheumatic heart failure: Secondary | ICD-10-CM | POA: Diagnosis not present

## 2019-06-20 DIAGNOSIS — D630 Anemia in neoplastic disease: Secondary | ICD-10-CM | POA: Diagnosis not present

## 2019-06-20 DIAGNOSIS — C7931 Secondary malignant neoplasm of brain: Secondary | ICD-10-CM | POA: Diagnosis not present

## 2019-06-20 DIAGNOSIS — Z510 Encounter for antineoplastic radiation therapy: Secondary | ICD-10-CM | POA: Diagnosis not present

## 2019-06-20 DIAGNOSIS — M19072 Primary osteoarthritis, left ankle and foot: Secondary | ICD-10-CM | POA: Diagnosis not present

## 2019-06-20 DIAGNOSIS — C3411 Malignant neoplasm of upper lobe, right bronchus or lung: Secondary | ICD-10-CM | POA: Diagnosis not present

## 2019-06-20 DIAGNOSIS — M19071 Primary osteoarthritis, right ankle and foot: Secondary | ICD-10-CM | POA: Diagnosis not present

## 2019-06-20 DIAGNOSIS — M170 Bilateral primary osteoarthritis of knee: Secondary | ICD-10-CM | POA: Diagnosis not present

## 2019-06-20 DIAGNOSIS — I130 Hypertensive heart and chronic kidney disease with heart failure and stage 1 through stage 4 chronic kidney disease, or unspecified chronic kidney disease: Secondary | ICD-10-CM | POA: Diagnosis not present

## 2019-06-20 DIAGNOSIS — I5042 Chronic combined systolic (congestive) and diastolic (congestive) heart failure: Secondary | ICD-10-CM | POA: Diagnosis not present

## 2019-06-20 MED ORDER — FLUCONAZOLE 100 MG PO TABS: 100.0000 mg | ORAL_TABLET | Freq: Every day | ORAL | 0 refills | Status: DC

## 2019-06-20 NOTE — Progress Notes (Deleted)
Cardiology Office Note   Date:  06/20/2019   ID:  Gregory, Bates 22-Oct-1941, MRN [id number]  PCP:  Street, Sharon Mt, MD  Cardiologist:  Lauree Chandler, MD   No chief complaint on file.   History of Present Illness: Gregory Bates is a 78 y.o. male with a hx of CAD s/p STEMI 2013 & NSTEMI 2014 w/PCI and CABG with LA clip in 04/5464, chronic diastolic HF with EF 03-54% and G2DD, PAF on Eliquis and amiodarone, HTN, HLD, DM type 2, CKD stage 3 and lung cancer who is being seen today for post hospital follow up.   He was seen in hospital consultation 06/06/2019-06/08/2019 for chest pain and elevated troponin levels.  He had a 1 week history of weakness and then 2 episodes of syncope with loss of bladder control. He has known metastatic brain lesions with vasogenic edema related to extensive stage small cell lung cancer. He has been given decadron per recommendation of oncology. MRI performed during hospitalization showed widespread mets, at least 9 lesions, large amount of vasogenic edema. Neurology was OK with continuing Eliquis for afib stroke risk reduction. Notes indicated that patient's grandson reported that the patient had some chest pain a few times last week while sitting in a chair, non exertional and non-radiating. EKG however did show more pronounced ST depressions/TWI, but enzyme trend argued against acute MI.     1. Elevated Hs Troponin:  -HsT 50>>54>>28>>12>>20, noted to be low level and not consistent with ACS  -No further chest pain complaints   3. CAD:  -s/p STEMI 2013 & NSTEMI 2014 w/PCI and CABG 07/2018. -Denies anginal symptoms  -Continue CV risk reduction therapy with ASA and Eliquis, statin, BB and nitrates    4. PAF: -EKG today with  -Will amiodarone, Eliquis for stroke ppx    5. Lung Ca w/ Brain Mets:  -CT head: new parenchymal cavitary masses with extensive vasogenic edema  -Follows with oncology      Past Medical History:   Diagnosis Date  . Acute renal insufficiency    a. During 11/2013 adm with atrial flutter.  . Anemia, unspecified   . Atrial fibrillation ([place])   . CAD (coronary artery disease), native coronary artery    a. s/p cath May 2011 >> DES distal RCA;  b. 01/2013 NSTEMI >> OM1 99p (2.75x12 Promus Premier DES);  c. LHC (2/15): PCI: Promus DES to dist RCA ;  d. LHC 1/16 - dLM 62, oLAD 30, small D1 tandem 53, pOM1 stent ok, mOM 50-60, dOM bifurcation 50-60 in both vessels, AV 32-70 jailed by stent, pRCA 22 mRCA stent ok, dRCA stent ok, PLB smal 50, dPDA 60, 80; EF 35-40 >> med Rx - PCI of PDA if angina  . Chronic combined systolic and diastolic CHF (congestive heart failure) ([place]) 12/23/2015   Echo 2/17: Mild LVH, EF 45-50%, inferior akinesis, grade 2 diastolic dysfunction, mild AI, mild MR, mild LAE, PASP 44 mmHg   . Colorectal polyps 2002  . Diabetes mellitus    AODM  . Erectile dysfunction   . History of echocardiogram    a. 01/2013 Echo: EF 55%, mid-dist inflat HK, Gr1 DD, Triv AI/TR, Mild MR.;  b.  Echo (1/15): Mild LVH, EF 55%, inferolateral hypokinesis, grade 1 diastolic dysfunction, mild AI, trivial MR, moderate LAE, PASP 36 mmHg   . Hx of cardiovascular stress test    Stress myoview 08/07/13 with LVEF 44%, inferior scar, no ischemia  . Hyperlipidemia   .  Hypertension   . Hyponatremia   . Ischemic cardiomyopathy    a. EF 35-40% by LHC in 1/16; b. Echo 2/17: Mild LVH, EF 45-50%, inferior akinesis, grade 2 diastolic dysfunction, mild AI, mild MR, mild LAE, PASP 44 mmHg  . Myocardial infarction ([place])   . Oxygen deficiency   . PAF (paroxysmal atrial fibrillation) ([place])    a. Dx 11/2013. Spont conv. Placed on eliquis.  . Small cell lung cancer in adult [facility]) 07/28/2018    Past Surgical History:  Procedure Laterality Date  . BIOPSY  08/01/2018   Procedure: BIOPSY OF PERICARDIAL LYMPH NODE AND HILAR LYMPH NODE;  Surgeon: Grace Isaac, MD;  Location: [place];  Service: Open Heart Surgery;;  .  CLIPPING OF ATRIAL APPENDAGE Left 08/01/2018   Procedure: CLIPPING OF ATRIAL APPENDAGE;  Surgeon: Grace Isaac, MD;  Location: [place];  Service: Open Heart Surgery;  Laterality: Left;  . COLONOSCOPY W/ POLYPECTOMY  2002   [facility]  . COLONOSCOPY W/ POLYPECTOMY  2004; 2007 negative  . CORONARY ANGIOPLASTY  12/26/2013  . CORONARY ARTERY BYPASS GRAFT N/A 08/01/2018   Procedure: CORONARY ARTERY BYPASS GRAFTING (CABG) TIMES THREE USING LEFT INTERNAL MAMMARY ARTERY TO LAD, RIGHT GREATER SAPHENOUS VEIN GRAFT TO OM AND RCA, VEIN HARVESTED ENDOSCOPICALLY;  Surgeon: Grace Isaac, MD;  Location: [place];  Service: Open Heart Surgery;  Laterality: N/A;  . CORONARY STENT PLACEMENT  2007, 2011  . infantile paralysis facial asymmetry    . LEFT HEART CATH AND CORONARY ANGIOGRAPHY N/A 05/18/2017   Procedure: Left Heart Cath and Coronary Angiography;  Surgeon: Martinique, Peter M, MD;  Location: [place] [facility];  Service: Cardiovascular;  Laterality: N/A;  . LEFT HEART CATH AND CORONARY ANGIOGRAPHY N/A 07/27/2018   Procedure: LEFT HEART CATH AND CORONARY ANGIOGRAPHY;  Surgeon: Martinique, Peter M, MD;  Location: [place] [facility];  Service: Cardiovascular;  Laterality: N/A;  . LEFT HEART CATHETERIZATION WITH CORONARY ANGIOGRAM N/A 10/02/2012   Procedure: LEFT HEART CATHETERIZATION WITH CORONARY ANGIOGRAM;  Surgeon: Peter M Martinique, MD;  Location: [facility] CATH LAB;  Service: Cardiovascular;  Laterality: N/A;  . LEFT HEART CATHETERIZATION WITH CORONARY ANGIOGRAM N/A 02/20/2013   Procedure: LEFT HEART CATHETERIZATION WITH CORONARY ANGIOGRAM;  Surgeon: Burnell Blanks, MD;  Location: [facility] CATH LAB;  Service: Cardiovascular;  Laterality: N/A;  . LEFT HEART CATHETERIZATION WITH CORONARY ANGIOGRAM N/A 12/26/2013   Procedure: LEFT HEART CATHETERIZATION WITH CORONARY ANGIOGRAM;  Surgeon: Burnell Blanks, MD;  Location: [facility] CATH LAB;  Service: Cardiovascular;  Laterality: N/A;  . LEFT HEART CATHETERIZATION WITH CORONARY  ANGIOGRAM N/A 11/28/2014   Procedure: LEFT HEART CATHETERIZATION WITH CORONARY ANGIOGRAM;  Surgeon: Burnell Blanks, MD;  Location: [facility] CATH LAB;  Service: Cardiovascular;  Laterality: N/A;  . PERCUTANEOUS CORONARY STENT INTERVENTION (PCI-S)  10/02/2012   Procedure: PERCUTANEOUS CORONARY STENT INTERVENTION (PCI-S);  Surgeon: Peter M Martinique, MD;  Location: [facility] CATH LAB;  Service: Cardiovascular;;  . PERCUTANEOUS CORONARY STENT INTERVENTION (PCI-S)  02/20/2013   Procedure: PERCUTANEOUS CORONARY STENT INTERVENTION (PCI-S);  Surgeon: Burnell Blanks, MD;  Location: [facility] CATH LAB;  Service: Cardiovascular;;  . PERCUTANEOUS CORONARY STENT INTERVENTION (PCI-S)  12/26/2013   Procedure: PERCUTANEOUS CORONARY STENT INTERVENTION (PCI-S);  Surgeon: Burnell Blanks, MD;  Location: [facility] CATH LAB;  Service: Cardiovascular;;  . TEE WITHOUT CARDIOVERSION N/A 08/01/2018   Procedure: TRANSESOPHAGEAL ECHOCARDIOGRAM (TEE);  Surgeon: Grace Isaac, MD;  Location: [place];  Service: Open Heart Surgery;  Laterality: N/A;  . WISDOM  TOOTH EXTRACTION       Current Outpatient Medications  Medication Sig Dispense Refill  . acetaminophen (TYLENOL) 325 MG tablet Take 2 tablets (650 mg total) by mouth every 6 (six) hours as needed for mild pain.    [name] Kitchen albuterol (PROVENTIL HFA;VENTOLIN HFA) 108 (90 Base) MCG/ACT inhaler Inhale 2 puffs into the lungs every 6 (six) hours as needed for wheezing or shortness of breath. 1 Inhaler 2  . amiodarone (PACERONE) 200 MG tablet TAKE 1 TABLET (200 MG TOTAL) BY MOUTH DAILY. (Patient taking differently: Take 200 mg by mouth daily after breakfast. ) 90 tablet 1  . apixaban (ELIQUIS) 5 MG TABS tablet Take 1 tablet (5 mg total) by mouth 2 (two) times daily. 180 tablet 2  . aspirin EC 81 MG EC tablet Take 1 tablet (81 mg total) by mouth daily.    [name] Kitchen atorvastatin (LIPITOR) 20 MG tablet TAKE 1 TABLET EVERY DAY (Patient taking differently: Take 20 mg by mouth daily. ) 90 tablet 3  .  cholecalciferol (VITAMIN D) 1000 UNITS tablet Take 1,000 Units by mouth every evening.     [name] Kitchen dexamethasone (DECADRON) 4 MG tablet Take 1 tablet (4 mg total) by mouth every 6 (six) hours. 120 tablet 0  . fexofenadine (ALLEGRA) 180 MG tablet Take 180 mg by mouth daily as needed for allergies.     . fluticasone (FLONASE) 50 MCG/ACT nasal spray Place 1 spray into both nostrils daily as needed for allergies.     . furosemide (LASIX) 20 MG tablet Take 1 tablet (20 mg total) by mouth daily as needed for fluid (If 3 - 5 pound fluid weight gain; take lasix and notify physician). 90 tablet 1  . guaiFENesin (MUCINEX) 600 MG 12 hr tablet Take 1 tablet (600 mg total) by mouth 2 (two) times daily as needed for cough or to loosen phlegm.    . insulin aspart (NOVOLOG) 100 UNIT/ML injection Inject 10 Units into the skin 3 (three) times daily with meals. 10 mL 1  . insulin glargine (LANTUS) 100 UNIT/ML injection Inject 0.25 mLs (25 Units total) into the skin at bedtime. 10 mL 1  . Iron Combinations (IRON COMPLEX PO) Take 1 tablet by mouth at bedtime.     . isosorbide mononitrate (IMDUR) 60 MG 24 hr tablet TAKE 1 TABLET EVERY DAY (Patient taking differently: Take 60 mg by mouth daily. ) 90 tablet 2  . metFORMIN (GLUCOPHAGE) 1000 MG tablet Take 500 mg by mouth daily with breakfast.     . metoprolol tartrate (LOPRESSOR) 25 MG tablet TAKE 1 TABLET TWICE DAILY (Patient taking differently: Take 25 mg by mouth 2 (two) times daily. ) 180 tablet 2  . Multiple Vitamin (MULTIVITAMIN) tablet Take 1 tablet by mouth at bedtime.     . nitroGLYCERIN (NITROSTAT) 0.4 MG SL tablet Place 1 tablet (0.4 mg total) under the tongue every 5 (five) minutes as needed for chest pain. 25 tablet 2  . ondansetron (ZOFRAN ODT) 4 MG disintegrating tablet Take 1 tablet (4 mg total) by mouth every 8 (eight) hours as needed for nausea or vomiting. 20 tablet 0  . prochlorperazine (COMPAZINE) 10 MG tablet Take 1 tablet (10 mg total) by mouth every 6 (six)  hours as needed for nausea or vomiting. 30 tablet 0  . vitamin E 100 UNIT capsule Take 100 Units by mouth at bedtime.      No current facility-administered medications for this visit.     Allergies:   Brilinta [ticagrelor] and Crestor [  rosuvastatin]    Social History:  The patient  reports that he quit smoking about 32 years ago. His smoking use included cigarettes. He has a 60.00 pack-year smoking history. He has never used smokeless tobacco. He reports that he does not drink alcohol or use drugs.   Family History:  The patient's ***family history includes Breast cancer in his sister; Colon cancer in his brother; Heart attack (age of onset: 78) in his brother; Prostate cancer in his brother; Stroke in his sister.    ROS:  Please see the history of present illness.   Otherwise, review of systems are positive for {NONE DEFAULTED:18576::"none"}.   All other systems are reviewed and negative.    PHYSICAL EXAM: VS:  There were no vitals taken for this visit. , BMI There is no height or weight on file to calculate BMI. GEN: Well nourished, well developed, in no acute distress HEENT: normal Neck: no JVD, carotid bruits, or masses Cardiac: ***RRR; no murmurs, rubs, or gallops,no edema  Respiratory:  clear to auscultation bilaterally, normal work of breathing GI: soft, nontender, nondistended, + BS MS: no deformity or atrophy Skin: warm and dry, no rash Neuro:  Strength and sensation are intact Psych: euthymic mood, full affect   EKG:  EKG {ACTION; IS/IS QTM:22633354} ordered today. The ekg ordered today demonstrates ***   Recent Labs: 08/13/2018: B Natriuretic Peptide 756.6 05/26/2019: TSH 1.380 06/05/2019: ALT 20 06/08/2019: BUN 31; Creatinine, Ser 1.23; Hemoglobin 11.1; Magnesium 2.3; Platelets 198; Potassium 4.6; Sodium 134    Lipid Panel    Component Value Date/Time   CHOL 170 11/04/2013 0617   TRIG 166 (H) 11/04/2013 0617   HDL 34 (L) 11/04/2013 0617   CHOLHDL 5.0 11/04/2013  0617   VLDL 33 11/04/2013 0617   LDLCALC 103 (H) 11/04/2013 0617   LDLDIRECT 69.3 11/26/2008 1256      Wt Readings from Last 3 Encounters:  06/08/19 157 lb 12.8 oz (71.6 kg)  05/29/19 157 lb 9.6 oz (71.5 kg)  04/17/19 170 lb 1.6 oz (77.2 kg)      Other studies Reviewed: Additional studies/ records that were reviewed today include:   Echocardiogram 07/28/2018: Study Conclusions - Left ventricle: The cavity size was normal. There was moderate concentric hypertrophy. Systolic function was normal. The estimated ejection fraction was in the range of 55% to 60%. Wall motion was normal; there were no regional wall motion abnormalities. Features are consistent with a pseudonormal left ventricular filling pattern, with concomitant abnormal relaxation and increased filling pressure (grade 2 diastolic dysfunction). Doppler parameters are consistent with elevated ventricular end-diastolic filling pressure. - Aortic valve: Trileaflet; moderately thickened, moderately calcified leaflets. There was mild regurgitation. - Aortic root: The aortic root was normal in size. - Mitral valve: There was mild regurgitation. - Left atrium: The atrium was mildly dilated. - Right ventricle: The cavity size was normal. Wall thickness was normal. Systolic function was normal. - Tricuspid valve: There was mild regurgitation. - Pulmonic valve: There was no regurgitation. - Pulmonary arteries: Systolic pressure was mildly increased. PA peak pressure: 31 mm Hg (S). - Inferior vena cava: The vessel was normal in size. - Pericardium, extracardiac: There was no pericardial effusion.  Impressions: - There is hypokinesis of the basal inferior and inefrolateral walls but other walls are hyperdynamic, overall LVEF 55-60%. This is improved from the prior study on 12/23/2015 (45-50%)   ASSESSMENT AND PLAN:  1.  ***   Current medicines are reviewed at length with the patient today.  The patient {ACTIONS; HAS/DOES NOT HAVE:19233} concerns regarding medicines.  The following changes have been made:  {PLAN; NO CHANGE:13088:s}  Labs/ tests ordered today include: *** No orders of the defined types were placed in this encounter.    Disposition:   FU with *** in {gen number 3-30:076226} {Days to years:10300}  Signed, Kathyrn Drown, NP  06/20/2019 3:03 PM    [facility] [address], [place], [place]  [address] Phone: [phone]; Fax: [phone]

## 2019-06-20 NOTE — Patient Instructions (Signed)
Decadron taper: 8/19-8/23: take one tab THREE times a day  8/24-8/28: take one tab TWO times a day  8/29-9/2: take one tab ONE time a day  9/3-9/7: take 1/2 (one half) tab ONCE every day  9/8-9/15: take 1/2 (one half) tab EVERY OTHER DAY   9/16: STOP taking

## 2019-06-21 DIAGNOSIS — M170 Bilateral primary osteoarthritis of knee: Secondary | ICD-10-CM | POA: Diagnosis not present

## 2019-06-21 DIAGNOSIS — C3411 Malignant neoplasm of upper lobe, right bronchus or lung: Secondary | ICD-10-CM | POA: Diagnosis not present

## 2019-06-21 DIAGNOSIS — I130 Hypertensive heart and chronic kidney disease with heart failure and stage 1 through stage 4 chronic kidney disease, or unspecified chronic kidney disease: Secondary | ICD-10-CM | POA: Diagnosis not present

## 2019-06-21 DIAGNOSIS — M19071 Primary osteoarthritis, right ankle and foot: Secondary | ICD-10-CM | POA: Diagnosis not present

## 2019-06-21 DIAGNOSIS — I5042 Chronic combined systolic (congestive) and diastolic (congestive) heart failure: Secondary | ICD-10-CM | POA: Diagnosis not present

## 2019-06-21 DIAGNOSIS — D630 Anemia in neoplastic disease: Secondary | ICD-10-CM | POA: Diagnosis not present

## 2019-06-21 DIAGNOSIS — M19072 Primary osteoarthritis, left ankle and foot: Secondary | ICD-10-CM | POA: Diagnosis not present

## 2019-06-21 DIAGNOSIS — C7931 Secondary malignant neoplasm of brain: Secondary | ICD-10-CM | POA: Diagnosis not present

## 2019-06-21 DIAGNOSIS — I0981 Rheumatic heart failure: Secondary | ICD-10-CM | POA: Diagnosis not present

## 2019-06-22 DIAGNOSIS — D630 Anemia in neoplastic disease: Secondary | ICD-10-CM | POA: Diagnosis not present

## 2019-06-22 DIAGNOSIS — M19071 Primary osteoarthritis, right ankle and foot: Secondary | ICD-10-CM | POA: Diagnosis not present

## 2019-06-22 DIAGNOSIS — M170 Bilateral primary osteoarthritis of knee: Secondary | ICD-10-CM | POA: Diagnosis not present

## 2019-06-22 DIAGNOSIS — M19072 Primary osteoarthritis, left ankle and foot: Secondary | ICD-10-CM | POA: Diagnosis not present

## 2019-06-22 DIAGNOSIS — C7931 Secondary malignant neoplasm of brain: Secondary | ICD-10-CM | POA: Diagnosis not present

## 2019-06-22 DIAGNOSIS — C3411 Malignant neoplasm of upper lobe, right bronchus or lung: Secondary | ICD-10-CM | POA: Diagnosis not present

## 2019-06-22 DIAGNOSIS — I5042 Chronic combined systolic (congestive) and diastolic (congestive) heart failure: Secondary | ICD-10-CM | POA: Diagnosis not present

## 2019-06-22 DIAGNOSIS — I0981 Rheumatic heart failure: Secondary | ICD-10-CM | POA: Diagnosis not present

## 2019-06-22 DIAGNOSIS — I130 Hypertensive heart and chronic kidney disease with heart failure and stage 1 through stage 4 chronic kidney disease, or unspecified chronic kidney disease: Secondary | ICD-10-CM | POA: Diagnosis not present

## 2019-06-23 DIAGNOSIS — I5042 Chronic combined systolic (congestive) and diastolic (congestive) heart failure: Secondary | ICD-10-CM | POA: Diagnosis not present

## 2019-06-23 DIAGNOSIS — M170 Bilateral primary osteoarthritis of knee: Secondary | ICD-10-CM | POA: Diagnosis not present

## 2019-06-23 DIAGNOSIS — D630 Anemia in neoplastic disease: Secondary | ICD-10-CM | POA: Diagnosis not present

## 2019-06-23 DIAGNOSIS — I130 Hypertensive heart and chronic kidney disease with heart failure and stage 1 through stage 4 chronic kidney disease, or unspecified chronic kidney disease: Secondary | ICD-10-CM | POA: Diagnosis not present

## 2019-06-23 DIAGNOSIS — C3411 Malignant neoplasm of upper lobe, right bronchus or lung: Secondary | ICD-10-CM | POA: Diagnosis not present

## 2019-06-23 DIAGNOSIS — M19072 Primary osteoarthritis, left ankle and foot: Secondary | ICD-10-CM | POA: Diagnosis not present

## 2019-06-23 DIAGNOSIS — I0981 Rheumatic heart failure: Secondary | ICD-10-CM | POA: Diagnosis not present

## 2019-06-23 DIAGNOSIS — M19071 Primary osteoarthritis, right ankle and foot: Secondary | ICD-10-CM | POA: Diagnosis not present

## 2019-06-23 DIAGNOSIS — C7931 Secondary malignant neoplasm of brain: Secondary | ICD-10-CM | POA: Diagnosis not present

## 2019-06-24 DIAGNOSIS — J9611 Chronic respiratory failure with hypoxia: Secondary | ICD-10-CM | POA: Diagnosis not present

## 2019-06-25 ENCOUNTER — Emergency Department (HOSPITAL_COMMUNITY): Payer: Medicare HMO

## 2019-06-25 ENCOUNTER — Inpatient Hospital Stay (HOSPITAL_COMMUNITY)
Admission: EM | Admit: 2019-06-25 | Discharge: 2019-06-28 | DRG: 638 | Disposition: A | Discharge status: Home Health | Payer: Medicare HMO | Attending: Internal Medicine | Admitting: Internal Medicine

## 2019-06-25 ENCOUNTER — Encounter (HOSPITAL_COMMUNITY): Payer: Self-pay | Admitting: Emergency Medicine

## 2019-06-25 DIAGNOSIS — I252 Old myocardial infarction: Secondary | ICD-10-CM

## 2019-06-25 DIAGNOSIS — Z8249 Family history of ischemic heart disease and other diseases of the circulatory system: Secondary | ICD-10-CM

## 2019-06-25 DIAGNOSIS — Z800 Family history of malignant neoplasm of digestive organs: Secondary | ICD-10-CM

## 2019-06-25 DIAGNOSIS — Z8042 Family history of malignant neoplasm of prostate: Secondary | ICD-10-CM

## 2019-06-25 DIAGNOSIS — I5032 Chronic diastolic (congestive) heart failure: Secondary | ICD-10-CM | POA: Diagnosis present

## 2019-06-25 DIAGNOSIS — E1165 Type 2 diabetes mellitus with hyperglycemia: Principal | ICD-10-CM | POA: Diagnosis present

## 2019-06-25 DIAGNOSIS — E860 Dehydration: Secondary | ICD-10-CM | POA: Diagnosis present

## 2019-06-25 DIAGNOSIS — R531 Weakness: Secondary | ICD-10-CM | POA: Diagnosis not present

## 2019-06-25 DIAGNOSIS — I951 Orthostatic hypotension: Secondary | ICD-10-CM

## 2019-06-25 DIAGNOSIS — Z7901 Long term (current) use of anticoagulants: Secondary | ICD-10-CM

## 2019-06-25 DIAGNOSIS — R55 Syncope and collapse: Secondary | ICD-10-CM

## 2019-06-25 DIAGNOSIS — Z951 Presence of aortocoronary bypass graft: Secondary | ICD-10-CM | POA: Diagnosis not present

## 2019-06-25 DIAGNOSIS — Z87891 Personal history of nicotine dependence: Secondary | ICD-10-CM | POA: Diagnosis not present

## 2019-06-25 DIAGNOSIS — J449 Chronic obstructive pulmonary disease, unspecified: Secondary | ICD-10-CM | POA: Diagnosis not present

## 2019-06-25 DIAGNOSIS — E1159 Type 2 diabetes mellitus with other circulatory complications: Secondary | ICD-10-CM | POA: Diagnosis not present

## 2019-06-25 DIAGNOSIS — I2510 Atherosclerotic heart disease of native coronary artery without angina pectoris: Secondary | ICD-10-CM | POA: Diagnosis present

## 2019-06-25 DIAGNOSIS — Z803 Family history of malignant neoplasm of breast: Secondary | ICD-10-CM

## 2019-06-25 DIAGNOSIS — Z20828 Contact with and (suspected) exposure to other viral communicable diseases: Secondary | ICD-10-CM | POA: Diagnosis not present

## 2019-06-25 DIAGNOSIS — I959 Hypotension, unspecified: Secondary | ICD-10-CM | POA: Diagnosis not present

## 2019-06-25 DIAGNOSIS — C7931 Secondary malignant neoplasm of brain: Secondary | ICD-10-CM | POA: Diagnosis not present

## 2019-06-25 DIAGNOSIS — I4891 Unspecified atrial fibrillation: Secondary | ICD-10-CM | POA: Diagnosis not present

## 2019-06-25 DIAGNOSIS — C3491 Malignant neoplasm of unspecified part of right bronchus or lung: Secondary | ICD-10-CM | POA: Diagnosis present

## 2019-06-25 DIAGNOSIS — N183 Chronic kidney disease, stage 3 unspecified: Secondary | ICD-10-CM | POA: Diagnosis present

## 2019-06-25 DIAGNOSIS — I480 Paroxysmal atrial fibrillation: Secondary | ICD-10-CM | POA: Diagnosis not present

## 2019-06-25 DIAGNOSIS — Z955 Presence of coronary angioplasty implant and graft: Secondary | ICD-10-CM

## 2019-06-25 DIAGNOSIS — E785 Hyperlipidemia, unspecified: Secondary | ICD-10-CM | POA: Diagnosis present

## 2019-06-25 DIAGNOSIS — T380X5A Adverse effect of glucocorticoids and synthetic analogues, initial encounter: Secondary | ICD-10-CM | POA: Diagnosis present

## 2019-06-25 DIAGNOSIS — D72829 Elevated white blood cell count, unspecified: Secondary | ICD-10-CM | POA: Diagnosis present

## 2019-06-25 DIAGNOSIS — Z923 Personal history of irradiation: Secondary | ICD-10-CM

## 2019-06-25 DIAGNOSIS — Z9221 Personal history of antineoplastic chemotherapy: Secondary | ICD-10-CM

## 2019-06-25 DIAGNOSIS — Z7952 Long term (current) use of systemic steroids: Secondary | ICD-10-CM

## 2019-06-25 DIAGNOSIS — Z7982 Long term (current) use of aspirin: Secondary | ICD-10-CM

## 2019-06-25 DIAGNOSIS — E1122 Type 2 diabetes mellitus with diabetic chronic kidney disease: Secondary | ICD-10-CM | POA: Diagnosis present

## 2019-06-25 DIAGNOSIS — I130 Hypertensive heart and chronic kidney disease with heart failure and stage 1 through stage 4 chronic kidney disease, or unspecified chronic kidney disease: Secondary | ICD-10-CM | POA: Diagnosis not present

## 2019-06-25 DIAGNOSIS — C3411 Malignant neoplasm of upper lobe, right bronchus or lung: Secondary | ICD-10-CM | POA: Diagnosis present

## 2019-06-25 DIAGNOSIS — Z823 Family history of stroke: Secondary | ICD-10-CM

## 2019-06-25 DIAGNOSIS — I10 Essential (primary) hypertension: Secondary | ICD-10-CM | POA: Diagnosis not present

## 2019-06-25 DIAGNOSIS — D649 Anemia, unspecified: Secondary | ICD-10-CM | POA: Diagnosis present

## 2019-06-25 DIAGNOSIS — Z794 Long term (current) use of insulin: Secondary | ICD-10-CM

## 2019-06-25 DIAGNOSIS — R079 Chest pain, unspecified: Secondary | ICD-10-CM | POA: Diagnosis not present

## 2019-06-25 LAB — BASIC METABOLIC PANEL
Anion gap: 9 (ref 5–15)
BUN: 44 mg/dL — ABNORMAL HIGH (ref 8–23)
CO2: 23 mmol/L (ref 22–32)
Calcium: 8.3 mg/dL — ABNORMAL LOW (ref 8.9–10.3)
Chloride: 101 mmol/L (ref 98–111)
Creatinine, Ser: 1.53 mg/dL — ABNORMAL HIGH (ref 0.61–1.24)
GFR calc Af Amer: 50 mL/min — ABNORMAL LOW (ref >60)
GFR calc non Af Amer: 43 mL/min — ABNORMAL LOW (ref >60)
Glucose, Bld: 248 mg/dL — ABNORMAL HIGH (ref 70–99)
Potassium: 4.2 mmol/L (ref 3.5–5.1)
Sodium: 133 mmol/L — ABNORMAL LOW (ref 135–145)

## 2019-06-25 LAB — CBC
HCT: 35.8 % — ABNORMAL LOW (ref 39.0–52.0)
Hemoglobin: 11.5 g/dL — ABNORMAL LOW (ref 13.0–17.0)
MCH: 30.1 pg (ref 26.0–34.0)
MCHC: 32.1 g/dL (ref 30.0–36.0)
MCV: 93.7 fL (ref 80.0–100.0)
Platelets: 122 10*3/uL — ABNORMAL LOW (ref 150–400)
RBC: 3.82 MIL/uL — ABNORMAL LOW (ref 4.22–5.81)
RDW: 17.6 % — ABNORMAL HIGH (ref 11.5–15.5)
WBC: 11.2 10*3/uL — ABNORMAL HIGH (ref 4.0–10.5)
nRBC: 0 % (ref 0.0–0.2)

## 2019-06-25 LAB — LACTIC ACID, PLASMA
Lactic Acid, Venous: 2.9 mmol/L (ref 0.5–1.9)
Lactic Acid, Venous: 3.3 mmol/L (ref 0.5–1.9)

## 2019-06-25 LAB — BRAIN NATRIURETIC PEPTIDE: B Natriuretic Peptide: 924 pg/mL — ABNORMAL HIGH (ref 0.0–100.0)

## 2019-06-25 LAB — CBG MONITORING, ED: Glucose-Capillary: 210 mg/dL — ABNORMAL HIGH (ref 70–99)

## 2019-06-25 LAB — TROPONIN I (HIGH SENSITIVITY): Troponin I (High Sensitivity): 39 ng/L — ABNORMAL HIGH (ref <18)

## 2019-06-25 MED ORDER — SODIUM CHLORIDE 0.9 % IV BOLUS
500.0000 mL | Freq: Once | INTRAVENOUS | Status: AC
  Administered 2019-06-26: 500 mL via INTRAVENOUS

## 2019-06-25 MED ORDER — SODIUM CHLORIDE 0.9 % IV BOLUS
250.0000 mL | Freq: Once | INTRAVENOUS | Status: AC
  Administered 2019-06-25: 250 mL via INTRAVENOUS

## 2019-06-25 MED ORDER — SODIUM CHLORIDE 0.9% FLUSH: 3.0000 mL | Freq: Once | INTRAVENOUS | Status: DC

## 2019-06-25 NOTE — ED Triage Notes (Addendum)
Patient here from home with complaints of weakness while getting out of shower. Hyperglycemia reading hi on monitor. Hx of same. Cancer patient, chemo months ago. Given 671ml fluid and insulin (unknown amount) via EMS.

## 2019-06-25 NOTE — ED Provider Notes (Signed)
[facility] DEPT Provider Note   CSN: [id number] Arrival date & time: 06/25/19  2118     History   Chief Complaint Chief Complaint  Patient presents with   Weakness   Hyperglycemia    HPI Gregory Bates is a 78 y.o. 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 fib on Eliquis, CKD stage III, COPD, seizure-like activity, chronic diastolic heart failure (EF 45-50%) who presents to the emergency department by EMS with a chief complaint of syncope and collapse.  The patient's grandson, his primary caregiver, reports that the patient began complaining of generalized weakness at approximately 18:45 after getting out of the shower.  He reports that he checked his blood pressure received a reading of 91/51 and glucometer read high so he gave him 10 units of NovoLog.  He reports that 30 minutes later he rechecked his blood sugar and it was at 542 so he gave him his nighttime dose of 25 units of Lantus.  He states that at approximately 2000 that the patient was attempting to stand while using his walker.  He reports that the patient became increasingly weak and "collasped" into his arms.  He reports that he was able to sit the patient back in his chair.  He rechecked his blood pressure at that time, which was 87/44 and blood sugar was in the 400s.  He reports that when he sat back in the chair that the patient was holding his chest, and the patient's grandson noted that his right arm was shaking, which was new.  No other shaking or jerking.   The patient denies any complaints at this time.  He reports that he was feeling generally weak earlier tonight, but reports that he is feeling at his baseline now.  He reports that he has been taking prednisone and has 2 days left in the course.  At this time, he denies of fever, chills, cough, shortness of breath, chest pain, leg swelling, abdominal pain, nausea, vomiting, or  diarrhea.  No polydipsia or polyuria.  No missed doses of his home medication.      The history is provided by the patient. No language interpreter was used.    Past Medical History:  Diagnosis Date   Acute renal insufficiency    a. During 11/2013 adm with atrial flutter.   Anemia, unspecified    Atrial fibrillation (HCC)    CAD (coronary artery disease), native coronary artery    a. s/p cath May 2011 >> DES distal RCA;  b. 01/2013 NSTEMI >> OM1 99p (2.75x12 Promus Premier DES);  c. LHC (2/15): PCI: Promus DES to dist RCA ;  d. LHC 1/16 - dLM 32, oLAD 30, small D1 tandem 92, pOM1 stent ok, mOM 12-60, dOM bifurcation 50-60 in both vessels, AV 10-70 jailed by stent, pRCA 70 mRCA stent ok, dRCA stent ok, PLB smal 50, dPDA 60, 80; EF 35-40 >> med Rx - PCI of PDA if angina   Chronic combined systolic and diastolic CHF (congestive heart failure) ([place]) 12/23/2015   Echo 2/17: Mild LVH, EF 45-50%, inferior akinesis, grade 2 diastolic dysfunction, mild AI, mild MR, mild LAE, PASP 44 mmHg    Colorectal polyps 2002   Diabetes mellitus    AODM   Erectile dysfunction    History of echocardiogram    a. 01/2013 Echo: EF 55%, mid-dist inflat HK, Gr1 DD, Triv AI/TR, Mild MR.;  b.  Echo (  1/15): Mild LVH, EF 55%, inferolateral hypokinesis, grade 1 diastolic dysfunction, mild AI, trivial MR, moderate LAE, PASP 36 mmHg    Hx of cardiovascular stress test    Stress myoview 08/07/13 with LVEF 44%, inferior scar, no ischemia   Hyperlipidemia    Hypertension    Hyponatremia    Ischemic cardiomyopathy    a. EF 35-40% by LHC in 1/16; b. Echo 2/17: Mild LVH, EF 45-50%, inferior akinesis, grade 2 diastolic dysfunction, mild AI, mild MR, mild LAE, PASP 44 mmHg   Myocardial infarction (HCC)    Oxygen deficiency    PAF (paroxysmal atrial fibrillation) ([place])    a. Dx 11/2013. Spont conv. Placed on eliquis.   Small cell lung cancer in adult [facility]) 07/28/2018    Patient Active Problem List    Diagnosis Date Noted   Syncope 06/26/2019   Orthostatic hypotension 06/26/2019   Metastatic small cell carcinoma to brain ([place]) 06/05/2019   Syncope and collapse 06/04/2019   Metastatic cancer to brain ([place]) 06/04/2019   COPD (chronic obstructive pulmonary disease) ([place]) 10/03/2018   Renal insufficiency 09/19/2018   Pleural effusion, right    Chronic respiratory failure ([place]) 08/13/2018   Acute respiratory failure ([place]) 08/13/2018   Leukocytosis    Extensive stage primary small cell carcinoma of lung ([place]) 08/11/2018   Encounter for antineoplastic chemotherapy 08/11/2018   Encounter for antineoplastic immunotherapy 08/11/2018   Goals of care, counseling/discussion 08/11/2018   Acute respiratory failure with hypoxia ([place]) 08/09/2018   Seizure-like activity ([place]) 08/09/2018   S/P CABG x 3 08/01/2018   Malignant neoplasm of bronchus of right upper lobe ([place]) 07/28/2018   PAF (paroxysmal atrial fibrillation) ([place]) 07/26/2018   CKD (chronic kidney disease), stage III ([place]) 07/26/2018   Abnormal CXR 07/26/2018   Normochromic normocytic anemia    Hyponatremia 10/15/2017   Nausea & vomiting 10/15/2017   Chronic diastolic CHF (congestive heart failure) ([place]) 12/23/2015   Exertional angina ([place]) 11/21/2014   Unstable angina (HCC) 12/26/2013   Atrial flutter ([place]) 11/03/2013   CAD (coronary artery disease) 10/02/2012   Type 2 diabetes mellitus with vascular disease ([name]) 09/18/2010   Essential hypertension 03/28/2010   Normocytic anemia 12/04/2008   COLONIC POLYPS, RECURRENT 04/26/2008   HLD (hyperlipidemia) 10/07/2007   ERECTILE DYSFUNCTION 10/07/2007    Past Surgical History:  Procedure Laterality Date   BIOPSY  08/01/2018   Procedure: BIOPSY OF PERICARDIAL LYMPH NODE AND HILAR LYMPH NODE;  Surgeon: Grace Isaac, MD;  Location: [facility] OR;  Service: Open Heart Surgery;;   CLIPPING OF ATRIAL APPENDAGE Left 08/01/2018   Procedure: CLIPPING OF  ATRIAL APPENDAGE;  Surgeon: Grace Isaac, MD;  Location: [place];  Service: Open Heart Surgery;  Laterality: Left;   COLONOSCOPY W/ POLYPECTOMY  2002   [facility]   COLONOSCOPY W/ POLYPECTOMY  2004; 2007 negative   CORONARY ANGIOPLASTY  12/26/2013   CORONARY ARTERY BYPASS GRAFT N/A 08/01/2018   Procedure: CORONARY ARTERY BYPASS GRAFTING (CABG) TIMES THREE USING LEFT INTERNAL MAMMARY ARTERY TO LAD, RIGHT GREATER SAPHENOUS VEIN GRAFT TO OM AND RCA, VEIN HARVESTED ENDOSCOPICALLY;  Surgeon: Grace Isaac, MD;  Location: [facility];  Service: Open Heart Surgery;  Laterality: N/A;   CORONARY STENT PLACEMENT  2007, 2011   infantile paralysis facial asymmetry     LEFT HEART CATH AND CORONARY ANGIOGRAPHY N/A 05/18/2017   Procedure: Left Heart Cath and Coronary Angiography;  Surgeon: Martinique, Peter M, MD;  Location: [facility] LAB;  Service: Cardiovascular;  Laterality: N/A;  LEFT HEART CATH AND CORONARY ANGIOGRAPHY N/A 07/27/2018   Procedure: LEFT HEART CATH AND CORONARY ANGIOGRAPHY;  Surgeon: Martinique, Peter M, MD;  Location: [place] [facility] LAB;  Service: Cardiovascular;  Laterality: N/A;   LEFT HEART CATHETERIZATION WITH CORONARY ANGIOGRAM N/A 10/02/2012   Procedure: LEFT HEART CATHETERIZATION WITH CORONARY ANGIOGRAM;  Surgeon: Peter M Martinique, MD;  Location: [facility] CATH LAB;  Service: Cardiovascular;  Laterality: N/A;   LEFT HEART CATHETERIZATION WITH CORONARY ANGIOGRAM N/A 02/20/2013   Procedure: LEFT HEART CATHETERIZATION WITH CORONARY ANGIOGRAM;  Surgeon: Burnell Blanks, MD;  Location: [facility] CATH LAB;  Service: Cardiovascular;  Laterality: N/A;   LEFT HEART CATHETERIZATION WITH CORONARY ANGIOGRAM N/A 12/26/2013   Procedure: LEFT HEART CATHETERIZATION WITH CORONARY ANGIOGRAM;  Surgeon: Burnell Blanks, MD;  Location: [facility] CATH LAB;  Service: Cardiovascular;  Laterality: N/A;   LEFT HEART CATHETERIZATION WITH CORONARY ANGIOGRAM N/A 11/28/2014   Procedure: LEFT HEART CATHETERIZATION WITH  CORONARY ANGIOGRAM;  Surgeon: Burnell Blanks, MD;  Location: [facility] CATH LAB;  Service: Cardiovascular;  Laterality: N/A;   PERCUTANEOUS CORONARY STENT INTERVENTION (PCI-S)  10/02/2012   Procedure: PERCUTANEOUS CORONARY STENT INTERVENTION (PCI-S);  Surgeon: Peter M Martinique, MD;  Location: [facility] CATH LAB;  Service: Cardiovascular;;   PERCUTANEOUS CORONARY STENT INTERVENTION (PCI-S)  02/20/2013   Procedure: PERCUTANEOUS CORONARY STENT INTERVENTION (PCI-S);  Surgeon: Burnell Blanks, MD;  Location: [facility] CATH LAB;  Service: Cardiovascular;;   PERCUTANEOUS CORONARY STENT INTERVENTION (PCI-S)  12/26/2013   Procedure: PERCUTANEOUS CORONARY STENT INTERVENTION (PCI-S);  Surgeon: Burnell Blanks, MD;  Location: [facility] CATH LAB;  Service: Cardiovascular;;   TEE WITHOUT CARDIOVERSION N/A 08/01/2018   Procedure: TRANSESOPHAGEAL ECHOCARDIOGRAM (TEE);  Surgeon: Grace Isaac, MD;  Location: [place];  Service: Open Heart Surgery;  Laterality: N/A;   WISDOM TOOTH EXTRACTION          Home Medications    Prior to Admission medications   Medication Sig Start Date End Date Taking? Authorizing Provider  acetaminophen (TYLENOL) 325 MG tablet Take 2 tablets (650 mg total) by mouth every 6 (six) hours as needed for mild pain. 08/06/18  Yes Lars Pinks M, PA-C  albuterol (PROVENTIL HFA;VENTOLIN HFA) 108 (90 Base) MCG/ACT inhaler Inhale 2 puffs into the lungs every 6 (six) hours as needed for wheezing or shortness of breath. 08/15/18  Yes Rai, Ripudeep K, MD  amiodarone (PACERONE) 200 MG tablet TAKE 1 TABLET (200 MG TOTAL) BY MOUTH DAILY. Patient taking differently: Take 200 mg by mouth daily after breakfast.  02/23/19  Yes Burnell Blanks, MD  apixaban (ELIQUIS) 5 MG TABS tablet Take 1 tablet (5 mg total) by mouth 2 (two) times daily. 01/18/19  Yes Burnell Blanks, MD  aspirin EC 81 MG EC tablet Take 1 tablet (81 mg total) by mouth daily. 08/06/18  Yes Tacy Dura, Donielle M, PA-C    atorvastatin (LIPITOR) 20 MG tablet TAKE 1 TABLET EVERY DAY Patient taking differently: Take 20 mg by mouth daily.  10/31/18  Yes Burnell Blanks, MD  cholecalciferol (VITAMIN D) 1000 UNITS tablet Take 1,000 Units by mouth every evening.    Yes [provider]  dexamethasone (DECADRON) 4 MG tablet Take 1 tablet (4 mg total) by mouth every 6 (six) hours. 06/08/19 07/08/19 Yes Kyle, Tyrone A, DO  fexofenadine (ALLEGRA) 180 MG tablet Take 180 mg by mouth daily as needed for allergies.    Yes [provider]  fluticasone (FLONASE) 50 MCG/ACT nasal spray Place 1 spray into both nostrils daily as  needed for allergies.    Yes [provider]  furosemide (LASIX) 20 MG tablet Take 1 tablet (20 mg total) by mouth daily as needed for fluid (If 3 - 5 pound fluid weight gain; take lasix and notify physician). 06/08/19  Yes Kyle, Tyrone A, DO  insulin aspart (NOVOLOG) 100 UNIT/ML injection Inject 10 Units into the skin 3 (three) times daily with meals. 06/08/19 08/07/19 Yes Kyle, Tyrone A, DO  insulin glargine (LANTUS) 100 UNIT/ML injection Inject 0.25 mLs (25 Units total) into the skin at bedtime. 06/08/19 08/07/19 Yes Kyle, Tyrone A, DO  Iron Combinations (IRON COMPLEX PO) Take 1 tablet by mouth at bedtime.    Yes [provider]  metFORMIN (GLUCOPHAGE) 500 MG tablet Take 500 mg by mouth 2 (two) times daily. 06/15/19  Yes [provider]  metoprolol tartrate (LOPRESSOR) 25 MG tablet TAKE 1 TABLET TWICE DAILY Patient taking differently: Take 25 mg by mouth 2 (two) times daily.  12/21/18  Yes Burnell Blanks, MD  Multiple Vitamin (MULTIVITAMIN) tablet Take 1 tablet by mouth at bedtime.    Yes [provider]  nitroGLYCERIN (NITROSTAT) 0.4 MG SL tablet Place 1 tablet (0.4 mg total) under the tongue every 5 (five) minutes as needed for chest pain. 05/12/19 08/10/19 Yes Burnell Blanks, MD  ondansetron (ZOFRAN ODT) 4 MG disintegrating tablet Take 1 tablet  (4 mg total) by mouth every 8 (eight) hours as needed for nausea or vomiting. 08/15/18  Yes Rai, Ripudeep K, MD  prochlorperazine (COMPAZINE) 10 MG tablet Take 1 tablet (10 mg total) by mouth every 6 (six) hours as needed for nausea or vomiting. 08/29/18  Yes Curt Bears, MD  vitamin E 100 UNIT capsule Take 100 Units by mouth at bedtime.    Yes [provider]  isosorbide mononitrate (IMDUR) 60 MG 24 hr tablet TAKE 1 TABLET EVERY DAY Patient not taking: No sig reported 10/31/18 06/25/19  Burnell Blanks, MD    Family History Family History  Problem Relation Age of Onset   Heart attack Brother 90   Colon cancer Brother    Prostate cancer Brother    Breast cancer Sister    Stroke Sister    Diabetes Neg Hx    Hypertension Neg Hx     Social History Social History   Tobacco Use   Smoking status: Former Smoker    Packs/day: 2.00    Years: 30.00    Pack years: 60.00    Types: Cigarettes    Quit date: 11/02/1986    Years since quitting: 32.6   Smokeless tobacco: Never Used   Tobacco comment: smoked [place], up to 3 ppd (!)  Substance Use Topics   Alcohol use: No   Drug use: No     Allergies   Brilinta [ticagrelor] and Crestor [rosuvastatin]   Review of Systems Review of Systems  Constitutional: Negative for appetite change and fever.  Respiratory: Negative for apnea, cough, shortness of breath and wheezing.   Cardiovascular: Positive for chest pain. Negative for palpitations and leg swelling.  Gastrointestinal: Negative for abdominal pain, diarrhea, nausea and vomiting.  Endocrine: Negative for polydipsia and polyuria.  Genitourinary: Negative for dysuria, frequency, penile pain, scrotal swelling, testicular pain and urgency.  Musculoskeletal: Negative for arthralgias, back pain, myalgias and neck pain.  Skin: Negative for rash.  Allergic/Immunologic: Negative for immunocompromised state.  Neurological: Positive for syncope and weakness  (generalized). Negative for dizziness, seizures, light-headedness, numbness and headaches.  Psychiatric/Behavioral: Negative for confusion.  Physical Exam Updated Vital Signs BP (!) 100/57 (BP Location: Left Arm)    Pulse 93    Temp 97.9 F (36.6 C) (Oral)    Resp 18    Ht 6' (1.829 m)    Wt 69 kg    SpO2 99%    BMI 20.63 kg/m   Physical Exam Vitals signs and nursing note reviewed.  Constitutional:      General: He is not in acute distress.    Appearance: He is well-developed. He is not ill-appearing, toxic-appearing or diaphoretic.  HENT:     Head: Normocephalic.     Comments: Head is atraumatic Eyes:     Conjunctiva/sclera: Conjunctivae normal.  Neck:     Musculoskeletal: Neck supple.  Cardiovascular:     Rate and Rhythm: Normal rate. Rhythm irregularly irregular.     Heart sounds: Normal heart sounds. No murmur. No friction rub. No gallop.   Pulmonary:     Effort: Pulmonary effort is normal. No respiratory distress.     Breath sounds: Normal breath sounds. No stridor. No wheezing, rhonchi or rales.     Comments: Lungs are clear to auscultation bilaterally without increased work of breathing. Chest:     Chest wall: No tenderness.  Abdominal:     General: There is no distension.     Palpations: Abdomen is soft. There is no mass.     Tenderness: There is no abdominal tenderness. There is no right CVA tenderness, left CVA tenderness, guarding or rebound.     Hernia: No hernia is present.  Musculoskeletal:     Right lower leg: No edema.     Left lower leg: No edema.  Skin:    General: Skin is warm and dry.     Coloration: Skin is not jaundiced or pale.     Findings: No bruising or erythema.  Neurological:     Mental Status: He is alert.  Psychiatric:        Behavior: Behavior normal.      ED Treatments / Results  Labs (all labs ordered are listed, but only abnormal results are displayed) Labs Reviewed  BASIC METABOLIC PANEL - Abnormal; Notable for the  following components:      Result Value   Sodium 133 (*)    Glucose, Bld 248 (*)    BUN 44 (*)    Creatinine, Ser 1.53 (*)    Calcium 8.3 (*)    GFR calc non Af Amer 43 (*)    GFR calc Af Amer 50 (*)    All other components within normal limits  CBC - Abnormal; Notable for the following components:   WBC 11.2 (*)    RBC 3.82 (*)    Hemoglobin 11.5 (*)    HCT 35.8 (*)    RDW 17.6 (*)    Platelets 122 (*)    All other components within normal limits  URINALYSIS, ROUTINE W REFLEX MICROSCOPIC - Abnormal; Notable for the following components:   Glucose, UA >=500 (*)    All other components within normal limits  LACTIC ACID, PLASMA - Abnormal; Notable for the following components:   Lactic Acid, Venous 3.3 (*)    All other components within normal limits  LACTIC ACID, PLASMA - Abnormal; Notable for the following components:   Lactic Acid, Venous 2.9 (*)    All other components within normal limits  BRAIN NATRIURETIC PEPTIDE - Abnormal; Notable for the following components:   B Natriuretic Peptide 924.0 (*)    All other  components within normal limits  BASIC METABOLIC PANEL - Abnormal; Notable for the following components:   Sodium 134 (*)    Glucose, Bld 326 (*)    BUN 38 (*)    Creatinine, Ser 1.29 (*)    Calcium 8.2 (*)    GFR calc non Af Amer 53 (*)    All other components within normal limits  CBC - Abnormal; Notable for the following components:   RBC 3.69 (*)    Hemoglobin 11.1 (*)    HCT 34.8 (*)    RDW 17.8 (*)    Platelets 126 (*)    All other components within normal limits  CBG MONITORING, ED - Abnormal; Notable for the following components:   Glucose-Capillary 210 (*)    All other components within normal limits  TROPONIN I (HIGH SENSITIVITY) - Abnormal; Notable for the following components:   Troponin I (High Sensitivity) 39 (*)    All other components within normal limits  TROPONIN I (HIGH SENSITIVITY) - Abnormal; Notable for the following components:    Troponin I (High Sensitivity) 40 (*)    All other components within normal limits  SARS CORONAVIRUS 2  CBG MONITORING, ED    EKG EKG Interpretation  Date/Time:  Sunday June 25 2019 21:26:59 EDT Ventricular Rate:  96 PR Interval:    QRS Duration: 103 QT Interval:  330 QTC Calculation: 417 R Axis:   41 Text Interpretation:  Atrial fibrillation Ventricular premature complex Borderline low voltage, extremity leads Nonspecific repol abnormality, diffuse leads When compared with ECG of 06/06/2019, Atrial fibrillation has replaced Sinus rhythm Confirmed by Delora Fuel ([id number]) on 06/25/2019 11:52:26 PM   Radiology Dg Chest 2 View  Result Date: 06/25/2019 CLINICAL DATA:  Weakness, chest pain EXAM: CHEST - 2 VIEW COMPARISON:  06/04/2019 FINDINGS: Prior CABG. Heart is normal size. Lungs clear. No effusions. No acute bony abnormality. IMPRESSION: No active cardiopulmonary disease. Electronically Signed   By: Rolm Baptise M.D.   On: 06/25/2019 22:57    Procedures Procedures (including critical care time)  Medications Ordered in ED Medications  sodium chloride flush (NS) 0.9 % injection 3 mL (3 mLs Intravenous Not Given 06/25/19 2136)  aspirin EC tablet 81 mg (has no administration in time range)  amiodarone (PACERONE) tablet 200 mg (has no administration in time range)  atorvastatin (LIPITOR) tablet 20 mg (has no administration in time range)  dexamethasone (DECADRON) tablet 4 mg (4 mg Oral Given 06/26/19 0737)  apixaban (ELIQUIS) tablet 5 mg (has no administration in time range)  iron polysaccharides (NIFEREX) capsule 150 mg (has no administration in time range)  multivitamin with minerals tablet 1 tablet (has no administration in time range)  fluticasone (FLONASE) 50 MCG/ACT nasal spray 1 spray (has no administration in time range)  insulin detemir (LEVEMIR) injection 25 Units (has no administration in time range)  insulin aspart (novoLOG) injection 0-15 Units (has no administration in  time range)  insulin aspart (novoLOG) injection 0-5 Units (0 Units Subcutaneous Not Given 06/26/19 0253)  insulin aspart (novoLOG) injection 6 Units (has no administration in time range)  acetaminophen (TYLENOL) tablet 650 mg (has no administration in time range)    Or  acetaminophen (TYLENOL) suppository 650 mg (has no administration in time range)  0.9 %  sodium chloride infusion ( Intravenous New Bag/Given 06/26/19 0320)  docusate sodium (COLACE) capsule 100 mg (has no administration in time range)  polyethylene glycol (MIRALAX / GLYCOLAX) packet 17 g (has no administration in time range)  ondansetron (ZOFRAN)  tablet 4 mg (has no administration in time range)    Or  ondansetron (ZOFRAN) injection 4 mg (has no administration in time range)  metoprolol tartrate (LOPRESSOR) tablet 25 mg (has no administration in time range)  sodium chloride 0.9 % bolus 250 mL (0 mLs Intravenous Stopped 06/26/19 0032)  sodium chloride 0.9 % bolus 500 mL (0 mLs Intravenous Stopped 06/26/19 0219)     Initial Impression / Assessment and Plan / ED Course  I have reviewed the triage vital signs and the nursing notes.  Pertinent labs & imaging results that were available during my care of the patient were reviewed by me and considered in my medical decision making (see chart for details).        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 fib on Eliquis, CKD stage III, COPD, seizure-like activity, chronic diastolic heart failure (EF 45-50%) who presents to the emergency department from home with a chief complaint of generalized weakness and concern for syncope and collapse.  Patient was previously admitted for the same on August 2, which is initially thought to be secondary to vasovagal syncope, but work-up was otherwise concerning for dehydration and generalized weakness.  He was also diagnosed with brain mets and was started on Decadron.   Patient states that he has been taking prednisone at home, but per chart review, it appears that he has been taking Decadron.   On arrival to the ER, patient has been hypertensive with systolic pressure in the 01U to 90s despite receiving 600 cc of fluids by EMS.  He was given a 250 cc bolus as the patient has a history of heart failure, but initial lactate was elevated so he was given an additional 500 cc bolus improving to 2.9.  We will continue to monitor given the patient's history of heart failure with decreased EF.  The patient was seen and independently evaluated by Dr. Roxanne Mins, attending physician, who is in agreement with work-up and plan.  The patient's grandson reports that the patient was clutching his chest after the episode, but he the patient denies all complaints of chest pain or shortness of breath in the ER.  He was minimally tachycardic and EKG was consistent with A. Fib.  Previous EKGs did demonstrate the patient was in sinus rhythm.  No RVR.  He has been compliant with his home Eliquis.  BNP is elevated at 924, but he does not appear volume overloaded.  Lungs are clear to auscultation bilaterally and he has no peripheral edema.  Initial troponin is elevated at 39, but based on previous hospital admission, his troponin was as high as 50.  Repeat troponin is flat at 40.  Doubt ACS.  Orthostatic vital signs with flat blood pressures, but heart rate did increase from the 90s to 130s.   Previous admission stated the patient's baseline creatinine was 1.5-1.9.  However, creatinine at discharge was 1.23.  Today creatinine is 1.5.  The patient's grandson did note concentrated dark appearing urine.  Urinalysis is pending.  The patient has no urinary complaints.  Unsure about home p.o. intake, but the patient reports "he's been drinking lots of fluids".  I suspect syncope and generalized weakness are secondary to dehydration.  Of note, patient reported that glucometer was reading high at home.   Likely second to corticosteroids glucose is 195 in the ER with normal anion gap and bicarb.  Consult to the hospitalist team and  spoke with Dr. Hollice Gong, who will accept the patient for admission. The patient appears reasonably stabilized for admission considering the current resources, flow, and capabilities available in the ED at this time, and I doubt any other [facility] requiring further screening and/or treatment in the ED prior to admission.   Final Clinical Impressions(s) / ED Diagnoses   Final diagnoses:  Syncope and collapse  Hypotension, unspecified hypotension type    ED Discharge Orders    None       Joanne Gavel, PA-C 32/54/98 [id number]    Delora Fuel, MD 58/30/94 [phone]

## 2019-06-26 ENCOUNTER — Telehealth: Payer: Self-pay | Admitting: Internal Medicine

## 2019-06-26 ENCOUNTER — Other Ambulatory Visit: Payer: Self-pay

## 2019-06-26 DIAGNOSIS — Z8249 Family history of ischemic heart disease and other diseases of the circulatory system: Secondary | ICD-10-CM | POA: Diagnosis not present

## 2019-06-26 DIAGNOSIS — Z87891 Personal history of nicotine dependence: Secondary | ICD-10-CM | POA: Diagnosis not present

## 2019-06-26 DIAGNOSIS — D649 Anemia, unspecified: Secondary | ICD-10-CM | POA: Diagnosis present

## 2019-06-26 DIAGNOSIS — N183 Chronic kidney disease, stage 3 (moderate): Secondary | ICD-10-CM | POA: Diagnosis present

## 2019-06-26 DIAGNOSIS — Z951 Presence of aortocoronary bypass graft: Secondary | ICD-10-CM | POA: Diagnosis not present

## 2019-06-26 DIAGNOSIS — C3411 Malignant neoplasm of upper lobe, right bronchus or lung: Secondary | ICD-10-CM | POA: Diagnosis not present

## 2019-06-26 DIAGNOSIS — Z20828 Contact with and (suspected) exposure to other viral communicable diseases: Secondary | ICD-10-CM | POA: Diagnosis present

## 2019-06-26 DIAGNOSIS — I2510 Atherosclerotic heart disease of native coronary artery without angina pectoris: Secondary | ICD-10-CM | POA: Diagnosis present

## 2019-06-26 DIAGNOSIS — T380X5A Adverse effect of glucocorticoids and synthetic analogues, initial encounter: Secondary | ICD-10-CM | POA: Diagnosis present

## 2019-06-26 DIAGNOSIS — C3491 Malignant neoplasm of unspecified part of right bronchus or lung: Secondary | ICD-10-CM | POA: Diagnosis present

## 2019-06-26 DIAGNOSIS — R55 Syncope and collapse: Secondary | ICD-10-CM | POA: Diagnosis present

## 2019-06-26 DIAGNOSIS — I130 Hypertensive heart and chronic kidney disease with heart failure and stage 1 through stage 4 chronic kidney disease, or unspecified chronic kidney disease: Secondary | ICD-10-CM | POA: Diagnosis present

## 2019-06-26 DIAGNOSIS — D72829 Elevated white blood cell count, unspecified: Secondary | ICD-10-CM | POA: Diagnosis present

## 2019-06-26 DIAGNOSIS — I480 Paroxysmal atrial fibrillation: Secondary | ICD-10-CM | POA: Diagnosis present

## 2019-06-26 DIAGNOSIS — C7931 Secondary malignant neoplasm of brain: Secondary | ICD-10-CM | POA: Diagnosis present

## 2019-06-26 DIAGNOSIS — Z8042 Family history of malignant neoplasm of prostate: Secondary | ICD-10-CM | POA: Diagnosis not present

## 2019-06-26 DIAGNOSIS — I10 Essential (primary) hypertension: Secondary | ICD-10-CM | POA: Diagnosis not present

## 2019-06-26 DIAGNOSIS — I5032 Chronic diastolic (congestive) heart failure: Secondary | ICD-10-CM | POA: Diagnosis present

## 2019-06-26 DIAGNOSIS — I951 Orthostatic hypotension: Secondary | ICD-10-CM

## 2019-06-26 DIAGNOSIS — J449 Chronic obstructive pulmonary disease, unspecified: Secondary | ICD-10-CM | POA: Diagnosis present

## 2019-06-26 DIAGNOSIS — E1159 Type 2 diabetes mellitus with other circulatory complications: Secondary | ICD-10-CM | POA: Diagnosis not present

## 2019-06-26 DIAGNOSIS — Z794 Long term (current) use of insulin: Secondary | ICD-10-CM | POA: Diagnosis not present

## 2019-06-26 DIAGNOSIS — E785 Hyperlipidemia, unspecified: Secondary | ICD-10-CM | POA: Diagnosis present

## 2019-06-26 DIAGNOSIS — E1122 Type 2 diabetes mellitus with diabetic chronic kidney disease: Secondary | ICD-10-CM | POA: Diagnosis present

## 2019-06-26 DIAGNOSIS — E860 Dehydration: Secondary | ICD-10-CM | POA: Diagnosis present

## 2019-06-26 DIAGNOSIS — E1165 Type 2 diabetes mellitus with hyperglycemia: Secondary | ICD-10-CM | POA: Diagnosis present

## 2019-06-26 DIAGNOSIS — Z923 Personal history of irradiation: Secondary | ICD-10-CM | POA: Diagnosis not present

## 2019-06-26 DIAGNOSIS — I252 Old myocardial infarction: Secondary | ICD-10-CM | POA: Diagnosis not present

## 2019-06-26 LAB — URINALYSIS, ROUTINE W REFLEX MICROSCOPIC
Bacteria, UA: NONE SEEN
Bilirubin Urine: NEGATIVE
Glucose, UA: >=500 mg/dL — AB
Hgb urine dipstick: NEGATIVE
Ketones, ur: NEGATIVE mg/dL
Leukocytes,Ua: NEGATIVE
Nitrite: NEGATIVE
Protein, ur: NEGATIVE mg/dL
Specific Gravity, Urine: 1.014 (ref 1.005–1.030)
pH: 5 (ref 5.0–8.0)

## 2019-06-26 LAB — BASIC METABOLIC PANEL
Anion gap: 9 (ref 5–15)
BUN: 38 mg/dL — ABNORMAL HIGH (ref 8–23)
CO2: 24 mmol/L (ref 22–32)
Calcium: 8.2 mg/dL — ABNORMAL LOW (ref 8.9–10.3)
Chloride: 101 mmol/L (ref 98–111)
Creatinine, Ser: 1.29 mg/dL — ABNORMAL HIGH (ref 0.61–1.24)
GFR calc Af Amer: >60 mL/min (ref >60)
GFR calc non Af Amer: 53 mL/min — ABNORMAL LOW (ref >60)
Glucose, Bld: 326 mg/dL — ABNORMAL HIGH (ref 70–99)
Potassium: 4.3 mmol/L (ref 3.5–5.1)
Sodium: 134 mmol/L — ABNORMAL LOW (ref 135–145)

## 2019-06-26 LAB — GLUCOSE, CAPILLARY
Glucose-Capillary: 307 mg/dL — ABNORMAL HIGH (ref 70–99)
Glucose-Capillary: 240 mg/dL — ABNORMAL HIGH (ref 70–99)
Glucose-Capillary: 211 mg/dL — ABNORMAL HIGH (ref 70–99)

## 2019-06-26 LAB — CBC
HCT: 34.8 % — ABNORMAL LOW (ref 39.0–52.0)
Hemoglobin: 11.1 g/dL — ABNORMAL LOW (ref 13.0–17.0)
MCH: 30.1 pg (ref 26.0–34.0)
MCHC: 31.9 g/dL (ref 30.0–36.0)
MCV: 94.3 fL (ref 80.0–100.0)
Platelets: 126 10*3/uL — ABNORMAL LOW (ref 150–400)
RBC: 3.69 MIL/uL — ABNORMAL LOW (ref 4.22–5.81)
RDW: 17.8 % — ABNORMAL HIGH (ref 11.5–15.5)
WBC: 10.2 10*3/uL (ref 4.0–10.5)
nRBC: 0 % (ref 0.0–0.2)

## 2019-06-26 LAB — TROPONIN I (HIGH SENSITIVITY): Troponin I (High Sensitivity): 40 ng/L — ABNORMAL HIGH (ref <18)

## 2019-06-26 LAB — SARS CORONAVIRUS 2 (TAT 6-24 HRS): SARS Coronavirus 2: NEGATIVE

## 2019-06-26 MED ORDER — INSULIN ASPART 100 UNIT/ML ~~LOC~~ SOLN
0.0000 [IU] | Freq: Every day | SUBCUTANEOUS | Status: DC
  Administered 2019-06-26: 2 [IU] via SUBCUTANEOUS

## 2019-06-26 MED ORDER — POLYSACCHARIDE IRON COMPLEX 150 MG PO CAPS
150.0000 mg | ORAL_CAPSULE | Freq: Every day | ORAL | Status: DC
  Administered 2019-06-26 – 2019-06-27 (×2): 150 mg via ORAL
  Filled 2019-06-26 (×2): qty 1

## 2019-06-26 MED ORDER — INSULIN DETEMIR 100 UNIT/ML ~~LOC~~ SOLN: 25.0000 [IU] | Freq: Every day | SUBCUTANEOUS | Status: DC

## 2019-06-26 MED ORDER — SODIUM CHLORIDE 0.9 % IV SOLN
INTRAVENOUS | Status: AC
  Administered 2019-06-26: 03:00:00 via INTRAVENOUS

## 2019-06-26 MED ORDER — ATORVASTATIN CALCIUM 20 MG PO TABS
20.0000 mg | ORAL_TABLET | Freq: Every day | ORAL | Status: DC
  Administered 2019-06-26 – 2019-06-27 (×2): 20 mg via ORAL
  Filled 2019-06-26 (×2): qty 1

## 2019-06-26 MED ORDER — ADULT MULTIVITAMIN W/MINERALS CH
1.0000 | ORAL_TABLET | Freq: Every day | ORAL | Status: DC
  Administered 2019-06-26 – 2019-06-27 (×2): 1 via ORAL
  Filled 2019-06-26 (×2): qty 1

## 2019-06-26 MED ORDER — ACETAMINOPHEN 650 MG RE SUPP: 650.0000 mg | Freq: Four times a day (QID) | RECTAL | Status: DC | PRN

## 2019-06-26 MED ORDER — METOPROLOL TARTRATE 25 MG PO TABS
25.0000 mg | ORAL_TABLET | Freq: Two times a day (BID) | ORAL | Status: DC
  Administered 2019-06-27 – 2019-06-28 (×3): 25 mg via ORAL
  Filled 2019-06-26 (×5): qty 1

## 2019-06-26 MED ORDER — DEXAMETHASONE 4 MG PO TABS
4.0000 mg | ORAL_TABLET | Freq: Four times a day (QID) | ORAL | Status: DC
  Administered 2019-06-26 – 2019-06-28 (×10): 4 mg via ORAL
  Filled 2019-06-26 (×12): qty 1

## 2019-06-26 MED ORDER — FLUTICASONE PROPIONATE 50 MCG/ACT NA SUSP: 1.0000 | Freq: Every day | NASAL | Status: DC | PRN

## 2019-06-26 MED ORDER — ONDANSETRON HCL 4 MG/2ML IJ SOLN
4.0000 mg | Freq: Four times a day (QID) | INTRAMUSCULAR | Status: DC | PRN
  Filled 2019-06-26: qty 2

## 2019-06-26 MED ORDER — APIXABAN 5 MG PO TABS
5.0000 mg | ORAL_TABLET | Freq: Two times a day (BID) | ORAL | Status: DC
  Administered 2019-06-26 – 2019-06-28 (×5): 5 mg via ORAL
  Filled 2019-06-26 (×5): qty 1

## 2019-06-26 MED ORDER — ONDANSETRON HCL 4 MG PO TABS: 4.0000 mg | ORAL_TABLET | Freq: Four times a day (QID) | ORAL | Status: DC | PRN

## 2019-06-26 MED ORDER — DOCUSATE SODIUM 100 MG PO CAPS
100.0000 mg | ORAL_CAPSULE | Freq: Two times a day (BID) | ORAL | Status: DC
  Administered 2019-06-26 – 2019-06-28 (×5): 100 mg via ORAL
  Filled 2019-06-26 (×5): qty 1

## 2019-06-26 MED ORDER — ACETAMINOPHEN 325 MG PO TABS: 650.0000 mg | ORAL_TABLET | Freq: Four times a day (QID) | ORAL | Status: DC | PRN

## 2019-06-26 MED ORDER — POLYETHYLENE GLYCOL 3350 17 G PO PACK: 17.0000 g | PACK | Freq: Every day | ORAL | Status: DC | PRN

## 2019-06-26 MED ORDER — INSULIN ASPART 100 UNIT/ML ~~LOC~~ SOLN
0.0000 [IU] | Freq: Three times a day (TID) | SUBCUTANEOUS | Status: DC
  Administered 2019-06-26: 5 [IU] via SUBCUTANEOUS
  Administered 2019-06-26: 11 [IU] via SUBCUTANEOUS
  Administered 2019-06-26: 2 [IU] via SUBCUTANEOUS
  Administered 2019-06-27: 15 [IU] via SUBCUTANEOUS
  Administered 2019-06-27 – 2019-06-28 (×2): 5 [IU] via SUBCUTANEOUS

## 2019-06-26 MED ORDER — ASPIRIN EC 81 MG PO TBEC
81.0000 mg | DELAYED_RELEASE_TABLET | Freq: Every day | ORAL | Status: DC
  Administered 2019-06-26 – 2019-06-28 (×3): 81 mg via ORAL
  Filled 2019-06-26 (×3): qty 1

## 2019-06-26 MED ORDER — AMIODARONE HCL 200 MG PO TABS
200.0000 mg | ORAL_TABLET | Freq: Every day | ORAL | Status: DC
  Administered 2019-06-26 – 2019-06-28 (×3): 200 mg via ORAL
  Filled 2019-06-26 (×3): qty 1

## 2019-06-26 MED ORDER — INSULIN ASPART 100 UNIT/ML ~~LOC~~ SOLN
6.0000 [IU] | Freq: Three times a day (TID) | SUBCUTANEOUS | Status: DC
  Administered 2019-06-26 – 2019-06-27 (×4): 6 [IU] via SUBCUTANEOUS

## 2019-06-26 NOTE — Progress Notes (Signed)
-   History of small cell lung cancer with metastasis to the brain. - Admitted earlier today with syncope and uncontrolled blood sugar. - Patient is volume depleted and orthostatic.  Cautious hydration Continue to monitor orthostasis Consult PT OT Consult case management for discharge planning  Consider palliative/hospice care team.

## 2019-06-26 NOTE — Telephone Encounter (Signed)
Scheduled appt per 8/24 sch message - unable to reach pt wife or grandson - left message for new appt on grandsons phone

## 2019-06-26 NOTE — H&P (Addendum)
History and Physical  RADIN RAPTIS AYT:[id number] DOB: [birth year] DOA: 06/25/2019   PCP: Emmaline Kluver, MD   Patient coming from: Home   Chief Complaint: Syncope   HPI: Gregory Bates is a 78 y.o. male with history of extensive stage small cell lung cancer on palliative chemotherapy and radiation with history of CAD status post CABG last year, chronic diastolic CHF last EF measured in October 2019 was 42 to 50% with grade 2 diastolic dysfunction, chronic and disease stage III, diabetes mellitus type 2, anemia, paroxysmal atrial fibrillation was brought to the ER after patient had a near syncopal episode. Seems patient complained to his grandson that he was feeling weak after a shower this evening and was noted to have high blood sugar. Later on, his blood sugar improved to 542 and he was given his night time Lantus 25 units. After this, the patient was attempting to ambulate with his walker when he became very weak and fell into grandson's arms but did not reportedly lose consciousness. Patient denies to me any chest pain, but says he did feel dizzy. Denies any reduced PO intake, fevers, chills, cough.  ED Course: In the ER, was noted to have elevated lactate and was given total 750 cc NS bolus due to his CHF, and his lacate has started to improve. He was noted to have borderline low BP, and has orthostasis meeting criteria with tachycardia upon standing.  Review of Systems: Please see HPI for pertinent positives and negatives. A complete 10 system review of systems are otherwise negative.  Past Medical History:  Diagnosis Date  . Acute renal insufficiency    a. During 11/2013 adm with atrial flutter.  . Anemia, unspecified   . Atrial fibrillation ([place])   . CAD (coronary artery disease), native coronary artery    a. s/p cath May 2011 >> DES distal RCA;  b. 01/2013 NSTEMI >> OM1 99p (2.75x12 Promus Premier DES);  c. LHC (2/15): PCI: Promus DES to dist RCA ;  d. LHC 1/16 - dLM 25, oLAD  30, small D1 tandem 68, pOM1 stent ok, mOM 50-60, dOM bifurcation 50-60 in both vessels, AV 78-70 jailed by stent, pRCA 70 mRCA stent ok, dRCA stent ok, PLB smal 50, dPDA 60, 80; EF 35-40 >> med Rx - PCI of PDA if angina  . Chronic combined systolic and diastolic CHF (congestive heart failure) ([place]) 12/23/2015   Echo 2/17: Mild LVH, EF 45-50%, inferior akinesis, grade 2 diastolic dysfunction, mild AI, mild MR, mild LAE, PASP 44 mmHg   . Colorectal polyps 2002  . Diabetes mellitus    AODM  . Erectile dysfunction   . History of echocardiogram    a. 01/2013 Echo: EF 55%, mid-dist inflat HK, Gr1 DD, Triv AI/TR, Mild MR.;  b.  Echo (1/15): Mild LVH, EF 55%, inferolateral hypokinesis, grade 1 diastolic dysfunction, mild AI, trivial MR, moderate LAE, PASP 36 mmHg   . Hx of cardiovascular stress test    Stress myoview 08/07/13 with LVEF 44%, inferior scar, no ischemia  . Hyperlipidemia   . Hypertension   . Hyponatremia   . Ischemic cardiomyopathy    a. EF 35-40% by LHC in 1/16; b. Echo 2/17: Mild LVH, EF 45-50%, inferior akinesis, grade 2 diastolic dysfunction, mild AI, mild MR, mild LAE, PASP 44 mmHg  . Myocardial infarction ([place])   . Oxygen deficiency   . PAF (paroxysmal atrial fibrillation) ([place])    a. Dx 11/2013. Spont conv. Placed on eliquis.  Diamantina Monks  cell lung cancer in adult [facility]) 07/28/2018   Past Surgical History:  Procedure Laterality Date  . BIOPSY  08/01/2018   Procedure: BIOPSY OF PERICARDIAL LYMPH NODE AND HILAR LYMPH NODE;  Surgeon: Grace Isaac, MD;  Location: [place];  Service: Open Heart Surgery;;  . CLIPPING OF ATRIAL APPENDAGE Left 08/01/2018   Procedure: CLIPPING OF ATRIAL APPENDAGE;  Surgeon: Grace Isaac, MD;  Location: [place];  Service: Open Heart Surgery;  Laterality: Left;  . COLONOSCOPY W/ POLYPECTOMY  2002   [facility]  . COLONOSCOPY W/ POLYPECTOMY  2004; 2007 negative  . CORONARY ANGIOPLASTY  12/26/2013  . CORONARY ARTERY BYPASS GRAFT N/A 08/01/2018   Procedure:  CORONARY ARTERY BYPASS GRAFTING (CABG) TIMES THREE USING LEFT INTERNAL MAMMARY ARTERY TO LAD, RIGHT GREATER SAPHENOUS VEIN GRAFT TO OM AND RCA, VEIN HARVESTED ENDOSCOPICALLY;  Surgeon: Grace Isaac, MD;  Location: [place];  Service: Open Heart Surgery;  Laterality: N/A;  . CORONARY STENT PLACEMENT  2007, 2011  . infantile paralysis facial asymmetry    . LEFT HEART CATH AND CORONARY ANGIOGRAPHY N/A 05/18/2017   Procedure: Left Heart Cath and Coronary Angiography;  Surgeon: Martinique, Peter M, MD;  Location: [place] CV [facility];  Service: Cardiovascular;  Laterality: N/A;  . LEFT HEART CATH AND CORONARY ANGIOGRAPHY N/A 07/27/2018   Procedure: LEFT HEART CATH AND CORONARY ANGIOGRAPHY;  Surgeon: Martinique, Peter M, MD;  Location: [place] [facility];  Service: Cardiovascular;  Laterality: N/A;  . LEFT HEART CATHETERIZATION WITH CORONARY ANGIOGRAM N/A 10/02/2012   Procedure: LEFT HEART CATHETERIZATION WITH CORONARY ANGIOGRAM;  Surgeon: Peter M Martinique, MD;  Location: [facility] CATH LAB;  Service: Cardiovascular;  Laterality: N/A;  . LEFT HEART CATHETERIZATION WITH CORONARY ANGIOGRAM N/A 02/20/2013   Procedure: LEFT HEART CATHETERIZATION WITH CORONARY ANGIOGRAM;  Surgeon: Burnell Blanks, MD;  Location: [facility] CATH LAB;  Service: Cardiovascular;  Laterality: N/A;  . LEFT HEART CATHETERIZATION WITH CORONARY ANGIOGRAM N/A 12/26/2013   Procedure: LEFT HEART CATHETERIZATION WITH CORONARY ANGIOGRAM;  Surgeon: Burnell Blanks, MD;  Location: [facility] CATH LAB;  Service: Cardiovascular;  Laterality: N/A;  . LEFT HEART CATHETERIZATION WITH CORONARY ANGIOGRAM N/A 11/28/2014   Procedure: LEFT HEART CATHETERIZATION WITH CORONARY ANGIOGRAM;  Surgeon: Burnell Blanks, MD;  Location: [facility] CATH LAB;  Service: Cardiovascular;  Laterality: N/A;  . PERCUTANEOUS CORONARY STENT INTERVENTION (PCI-S)  10/02/2012   Procedure: PERCUTANEOUS CORONARY STENT INTERVENTION (PCI-S);  Surgeon: Peter M Martinique, MD;  Location: [facility] CATH LAB;  Service:  Cardiovascular;;  . PERCUTANEOUS CORONARY STENT INTERVENTION (PCI-S)  02/20/2013   Procedure: PERCUTANEOUS CORONARY STENT INTERVENTION (PCI-S);  Surgeon: Burnell Blanks, MD;  Location: [facility] CATH LAB;  Service: Cardiovascular;;  . PERCUTANEOUS CORONARY STENT INTERVENTION (PCI-S)  12/26/2013   Procedure: PERCUTANEOUS CORONARY STENT INTERVENTION (PCI-S);  Surgeon: Burnell Blanks, MD;  Location: [facility] CATH LAB;  Service: Cardiovascular;;  . TEE WITHOUT CARDIOVERSION N/A 08/01/2018   Procedure: TRANSESOPHAGEAL ECHOCARDIOGRAM (TEE);  Surgeon: Grace Isaac, MD;  Location: [place];  Service: Open Heart Surgery;  Laterality: N/A;  . WISDOM TOOTH EXTRACTION      Social History:  reports that he quit smoking about 32 years ago. His smoking use included cigarettes. He has a 60.00 pack-year smoking history. He has never used smokeless tobacco. He reports that he does not drink alcohol or use drugs.   Allergies  Allergen Reactions  . Brilinta [Ticagrelor] Other (See Comments)    Arthralgias & myalgias  . Crestor [Rosuvastatin] Other (See Comments)  Myalgias    Family History  Problem Relation Age of Onset  . Heart attack Brother 5  . Colon cancer Brother   . Prostate cancer Brother   . Breast cancer Sister   . Stroke Sister   . Diabetes Neg Hx   . Hypertension Neg Hx      Prior to Admission medications   Medication Sig Start Date End Date Taking? Authorizing Provider  acetaminophen (TYLENOL) 325 MG tablet Take 2 tablets (650 mg total) by mouth every 6 (six) hours as needed for mild pain. 08/06/18  Yes Lars Pinks M, PA-C  albuterol (PROVENTIL HFA;VENTOLIN HFA) 108 (90 Base) MCG/ACT inhaler Inhale 2 puffs into the lungs every 6 (six) hours as needed for wheezing or shortness of breath. 08/15/18  Yes Rai, Ripudeep K, MD  amiodarone (PACERONE) 200 MG tablet TAKE 1 TABLET (200 MG TOTAL) BY MOUTH DAILY. Patient taking differently: Take 200 mg by mouth daily after breakfast.   02/23/19  Yes Burnell Blanks, MD  apixaban (ELIQUIS) 5 MG TABS tablet Take 1 tablet (5 mg total) by mouth 2 (two) times daily. 01/18/19  Yes Burnell Blanks, MD  aspirin EC 81 MG EC tablet Take 1 tablet (81 mg total) by mouth daily. 08/06/18  Yes Tacy Dura, Donielle M, PA-C  atorvastatin (LIPITOR) 20 MG tablet TAKE 1 TABLET EVERY DAY Patient taking differently: Take 20 mg by mouth daily.  10/31/18  Yes Burnell Blanks, MD  cholecalciferol (VITAMIN D) 1000 UNITS tablet Take 1,000 Units by mouth every evening.    Yes [provider]  dexamethasone (DECADRON) 4 MG tablet Take 1 tablet (4 mg total) by mouth every 6 (six) hours. 06/08/19 07/08/19 Yes Kyle, Tyrone A, DO  fexofenadine (ALLEGRA) 180 MG tablet Take 180 mg by mouth daily as needed for allergies.    Yes [provider]  fluticasone (FLONASE) 50 MCG/ACT nasal spray Place 1 spray into both nostrils daily as needed for allergies.    Yes [provider]  furosemide (LASIX) 20 MG tablet Take 1 tablet (20 mg total) by mouth daily as needed for fluid (If 3 - 5 pound fluid weight gain; take lasix and notify physician). 06/08/19  Yes Kyle, Tyrone A, DO  insulin aspart (NOVOLOG) 100 UNIT/ML injection Inject 10 Units into the skin 3 (three) times daily with meals. 06/08/19 08/07/19 Yes Kyle, Tyrone A, DO  insulin glargine (LANTUS) 100 UNIT/ML injection Inject 0.25 mLs (25 Units total) into the skin at bedtime. 06/08/19 08/07/19 Yes Kyle, Tyrone A, DO  Iron Combinations (IRON COMPLEX PO) Take 1 tablet by mouth at bedtime.    Yes [provider]  metFORMIN (GLUCOPHAGE) 500 MG tablet Take 500 mg by mouth 2 (two) times daily. 06/15/19  Yes [provider]  metoprolol tartrate (LOPRESSOR) 25 MG tablet TAKE 1 TABLET TWICE DAILY Patient taking differently: Take 25 mg by mouth 2 (two) times daily.  12/21/18  Yes Burnell Blanks, MD  Multiple Vitamin (MULTIVITAMIN) tablet Take 1 tablet by mouth at  bedtime.    Yes [provider]  nitroGLYCERIN (NITROSTAT) 0.4 MG SL tablet Place 1 tablet (0.4 mg total) under the tongue every 5 (five) minutes as needed for chest pain. 05/12/19 08/10/19 Yes Burnell Blanks, MD  ondansetron (ZOFRAN ODT) 4 MG disintegrating tablet Take 1 tablet (4 mg total) by mouth every 8 (eight) hours as needed for nausea or vomiting. 08/15/18  Yes Rai, Ripudeep K, MD  prochlorperazine (COMPAZINE) 10 MG tablet Take 1 tablet (  10 mg total) by mouth every 6 (six) hours as needed for nausea or vomiting. 08/29/18  Yes Curt Bears, MD  vitamin E 100 UNIT capsule Take 100 Units by mouth at bedtime.    Yes [provider]  isosorbide mononitrate (IMDUR) 60 MG 24 hr tablet TAKE 1 TABLET EVERY DAY Patient not taking: No sig reported 10/31/18 06/25/19  Burnell Blanks, MD    Physical Exam: BP (!) 87/53   Pulse (!) 104   Temp 98.9 F (37.2 C) (Oral)   Resp 16   SpO2 99%   General:  Alert, oriented, calm, in no acute distress  Eyes: EOMI, clear conjuctivae, white sclerea Neck: supple, no masses, trachea mildline  Cardiovascular: irregular and tachycardic, no murmurs or rubs, no peripheral edema  Respiratory: clear to auscultation bilaterally, no wheezes, no crackles  Abdomen: soft, nontender, nondistended, normal bowel tones heard  Skin: dry, no rashes  Musculoskeletal: no joint effusions, normal range of motion  Psychiatric: appropriate affect, normal speech  Neurologic: extraocular muscles intact, clear speech, moving all extremities with intact sensorium          Labs on Admission:  Basic Metabolic Panel: Recent Labs  Lab 06/25/19 2135  NA 133*  K 4.2  CL 101  CO2 23  GLUCOSE 248*  BUN 44*  CREATININE 1.53*  CALCIUM 8.3*   Liver Function Tests: No results for input(s): AST, ALT, ALKPHOS, BILITOT, PROT, ALBUMIN in the last 168 hours. No results for input(s): LIPASE, AMYLASE in the last 168 hours. No results for input(s):  AMMONIA in the last 168 hours. CBC: Recent Labs  Lab 06/25/19 2135  WBC 11.2*  HGB 11.5*  HCT 35.8*  MCV 93.7  PLT 122*   Cardiac Enzymes: No results for input(s): CKTOTAL, CKMB, CKMBINDEX, TROPONINI in the last 168 hours.  BNP (last 3 results) Recent Labs    08/09/18 0255 08/13/18 0109 06/25/19 2259  BNP 507.8* 756.6* 924.0*    ProBNP (last 3 results) No results for input(s): PROBNP in the last 8760 hours.  CBG: Recent Labs  Lab 06/25/19 2128  GLUCAP 210*    Radiological Exams on Admission: Dg Chest 2 View  Result Date: 06/25/2019 CLINICAL DATA:  Weakness, chest pain EXAM: CHEST - 2 VIEW COMPARISON:  06/04/2019 FINDINGS: Prior CABG. Heart is normal size. Lungs clear. No effusions. No acute bony abnormality. IMPRESSION: No active cardiopulmonary disease. Electronically Signed   By: Rolm Baptise M.D.   On: 06/25/2019 22:57   Assessment/Plan Present on Admission: . Type 2 diabetes mellitus with vascular disease ([place]) . HLD (hyperlipidemia) . Normocytic anemia . Essential hypertension . CAD (coronary artery disease) . PAF (paroxysmal atrial fibrillation) ([place]) . CKD (chronic kidney disease), stage III ([place]) . Malignant neoplasm of bronchus of right upper lobe ([place]) . Leukocytosis . COPD (chronic obstructive pulmonary disease) ([place]) . Metastatic cancer to brain ([place]) . Syncope  Active Problems:   Type 2 diabetes mellitus with vascular disease (H[facility])   HLD (hyperlipidemia)   Normocytic anemia   Essential hypertension   CAD (coronary artery disease)   PAF (paroxysmal atrial fibrillation) ([facility])   CKD (chronic kidney disease), stage III ([facility])   S/P CABG x 3   Malignant neoplasm of bronchus of right upper lobe ([facility])   Leukocytosis   COPD (chronic obstructive pulmonary disease) (H[facility])   Syncope and collapse   Metastatic cancer to brain ([place])   Syncope   Orthostatic hypotension  Presyncope/Orthostatic hypotension - patient appears to possibly be dehydrated -  inpatient admission - gentle IVF for 10 hours, with caution due to his CHF - repeat orthostatics in AM - hold anti-hypertensives and diuretic - consider PT evaluation in AM  PAF - currently in Atrial fibrillation, and possibly this could be contributing to his presyncope though his heart rate is currently acceptable - continue beta blocker and eliquis as well as amiodarone - consider cardiology evaluation in AM as it seems on previous visits he has remained in sinus  CAD - continue beta blocker, he does have elevated troponin but currently no symptoms - trend troponin  Acute on CKD 3 - hold renal toxins, hydrate and follow renal function in AM.  Lung cancer with brain mets - continue Decadron, currently due to restart chemotherapy next week  Type 2 DM - with elevated blood sugar likely due to steroids - diabetic diet - Levemir 25 units qHS - novolog TID  DVT prophylaxis: Eliquis   Code Status: FULL   Family Communication: None present   Disposition Plan: Home at DC.   Consults called: None   Admission status: Inpatient   Time spent: 32 minutes  Gregory Marry Guan MD [facility] Pager [phone]  If 7PM-7AM, please contact night-coverage [email or web address] Password [email or web address]  06/26/2019, 1:15 AM

## 2019-06-26 NOTE — TOC Progression Note (Signed)
Transition of Care [facility]) - Progression Note    Patient Details  Name: Gregory Bates MRN: [id number] Date of Birth: 1941/10/14  [facility]) CM/SW Contact  Purcell Mouton, RN Phone Number: 06/26/2019, 3:49 PM  Clinical Narrative:    Pt was active with [facility] with HHRN/PT/OT and will continue at discharge.         Expected Discharge Plan and Services                                                 Social Determinants of Health (SDOH) Interventions    Readmission Risk Interventions No flowsheet data found.

## 2019-06-26 NOTE — Progress Notes (Signed)
Patient became symptomatic during orthostatic vital signs this evening. Unable to complete set. Will attempt at a later time. Lamar Blinks NP notified and asked for clarification of metoprolol order.

## 2019-06-26 NOTE — Telephone Encounter (Signed)
Cancelled appts for tomorrow per Dr Julien Nordmann . Wife notified.    Message sent to scheduler to r/s for next week.

## 2019-06-27 ENCOUNTER — Inpatient Hospital Stay: Payer: Medicare HMO | Admitting: Internal Medicine

## 2019-06-27 ENCOUNTER — Inpatient Hospital Stay: Payer: Medicare HMO

## 2019-06-27 DIAGNOSIS — E1159 Type 2 diabetes mellitus with other circulatory complications: Secondary | ICD-10-CM

## 2019-06-27 DIAGNOSIS — I480 Paroxysmal atrial fibrillation: Secondary | ICD-10-CM

## 2019-06-27 LAB — GLUCOSE, CAPILLARY
Glucose-Capillary: 330 mg/dL — ABNORMAL HIGH (ref 70–99)
Glucose-Capillary: 438 mg/dL — ABNORMAL HIGH (ref 70–99)
Glucose-Capillary: 481 mg/dL — ABNORMAL HIGH (ref 70–99)
Glucose-Capillary: 216 mg/dL — ABNORMAL HIGH (ref 70–99)
Glucose-Capillary: 196 mg/dL — ABNORMAL HIGH (ref 70–99)

## 2019-06-27 MED ORDER — INSULIN ASPART 100 UNIT/ML ~~LOC~~ SOLN
8.0000 [IU] | Freq: Three times a day (TID) | SUBCUTANEOUS | Status: DC
  Administered 2019-06-27 – 2019-06-28 (×2): 8 [IU] via SUBCUTANEOUS

## 2019-06-27 MED ORDER — INSULIN DETEMIR 100 UNIT/ML ~~LOC~~ SOLN
30.0000 [IU] | Freq: Every day | SUBCUTANEOUS | Status: DC
  Administered 2019-06-27: 30 [IU] via SUBCUTANEOUS
  Filled 2019-06-27 (×2): qty 0.3

## 2019-06-27 NOTE — Progress Notes (Signed)
Patient has had varying heart rates throughout shift. Heart rate has increased to 140's unsustained when up walking in room. He has remained asymptomatic with no c/o dizziness, chest pain, SOB or palpitations. Continue to monitor heart rate and BP. Eulas Post, RN

## 2019-06-27 NOTE — Progress Notes (Signed)
CBG 438.  Dr. Daleen Bo on unit and notified.  Dr. Daleen Bo gave order to follow sliding scale and give 15 units along with ordered meal coverage.  Patient's RN notified.

## 2019-06-27 NOTE — Evaluation (Signed)
Physical Therapy Evaluation Patient Details Name: Gregory Bates MRN: [id number] DOB: 05-Mar-1941 Today's Date: 06/27/2019   History of Present Illness  78 yo male admitted to ED on 8/23 with weakness, hypergylcemia, syncope, falls. PMH includes CKD, CAD s/p CABG x3, small cell carcinoma R lung with brain mets treating with chemo, afib, CKDIII, COPD, diastolic HF, HLD, HTN, ischemic cardiomyopathy, afib.  Clinical Impression   Pt presents with generalized weakness, increased time and effort to perform mobility tasks, fair standing balance, and decreased activity tolerance due to extreme tachycardia and fatigue. Pt to benefit from acute PT to address deficits. Pt ambulated hallway distance with rollator with min guard assist, verbal cuing for form provided. Pt  With Hr up to 140 bpm with activity, pt asymptomatic. Orthostatic vitals taken, pt not orthostatic or symptomatic. See flow sheets. PT recommending continuing with HHPT. PT to progress mobility as tolerated, and will continue to follow acutely.      Follow Up Recommendations Home health PT    Equipment Recommendations  None recommended by PT    Recommendations for Other Services       Precautions / Restrictions Precautions Precautions: Fall Precaution Comments: watch HR with mobility Restrictions Weight Bearing Restrictions: No      Mobility  Bed Mobility Overal bed mobility: Needs Assistance Bed Mobility: Supine to Sit     Supine to sit: Supervision     General bed mobility comments: supervision for safety, verbal cuing for sequencing to EOB.  Transfers Overall transfer level: Needs assistance Equipment used: 4-wheeled walker Transfers: Sit to/from Stand Sit to Stand: Min guard         General transfer comment: min guard for safety, verbal cuing for hand placement when rising.  Ambulation/Gait Ambulation/Gait assistance: Min guard;Supervision Gait Distance (Feet): 200 Feet Assistive device: Rolling walker (2  wheeled) Gait Pattern/deviations: Step-through pattern;Decreased stride length Gait velocity: decr   General Gait Details: Supervision to min guard for safety, pt steady with use of RW.  Stairs            Wheelchair Mobility    Modified Rankin (Stroke Patients Only)       Balance Overall balance assessment: Needs assistance Sitting-balance support: No upper extremity supported Sitting balance-Leahy Scale: Good     Standing balance support: Bilateral upper extremity supported Standing balance-Leahy Scale: Fair Standing balance comment: able to stand and transfer without UE support, UE support required for hallway navigation                             Pertinent Vitals/Pain Pain Assessment: No/denies pain    Home Living Family/patient expects to be discharged to:: Private residence Living Arrangements: Spouse/significant other Available Help at Discharge: Family;Available 24 hours/day(grandson, wife, HH, son) Type of Home: House Home Access: Stairs to enter;Ramped entrance Entrance Stairs-Rails: Right;Left Entrance Stairs-Number of Steps: 3 Home Layout: Multi-level;Able to live on main level with bedroom/bathroom Home Equipment: Walker - 4 wheels;Shower seat - built in      Prior Function Level of Independence: Needs assistance   Gait / Transfers Assistance Needed: pt reports using rollator for ambulation PTA  ADL's / Homemaking Assistance Needed: Pt states he receives assist with cooking, cleaning, dressing self, bathing.        Hand Dominance   Dominant Hand: Right    Extremity/Trunk Assessment   Upper Extremity Assessment Upper Extremity Assessment: Defer to OT evaluation    Lower Extremity Assessment Lower Extremity Assessment: Generalized  weakness    Cervical / Trunk Assessment Cervical / Trunk Assessment: Normal  Communication   Communication: No difficulties  Cognition Arousal/Alertness: Awake/alert Behavior During Therapy:  WFL for tasks assessed/performed Overall Cognitive Status: Within Functional Limits for tasks assessed                                        General Comments      Exercises     Assessment/Plan    PT Assessment Patient needs continued PT services  PT Problem List Decreased strength;Decreased mobility;Decreased balance;Decreased knowledge of use of DME;Decreased activity tolerance       PT Treatment Interventions DME instruction;Therapeutic exercise;Gait training;Balance training;Stair training;Functional mobility training;Neuromuscular re-education;Therapeutic activities    PT Goals (Current goals can be found in the Care Plan section)  Acute Rehab PT Goals Patient Stated Goal: go home PT Goal Formulation: With patient Time For Goal Achievement: 07/11/19 Potential to Achieve Goals: Good    Frequency Min 3X/week   Barriers to discharge        Co-evaluation               AM-PAC PT "6 Clicks" Mobility  Outcome Measure Help needed turning from your back to your side while in a flat bed without using bedrails?: A Little Help needed moving from lying on your back to sitting on the side of a flat bed without using bedrails?: A Little Help needed moving to and from a bed to a chair (including a wheelchair)?: A Little Help needed standing up from a chair using your arms (e.g., wheelchair or bedside chair)?: A Little Help needed to walk in hospital room?: A Little Help needed climbing 3-5 steps with a railing? : A Little 6 Click Score: 18    End of Session Equipment Utilized During Treatment: Gait belt Activity Tolerance: Patient tolerated treatment well Patient left: in chair;with call bell/phone within reach;with chair alarm set Nurse Communication: Mobility status PT Visit Diagnosis: Other abnormalities of gait and mobility (R26.89);Unsteadiness on feet (R26.81)    Time: 5638-9373 PT Time Calculation (min) (ACUTE ONLY): 19 min   Charges:   PT  Evaluation $PT Eval Low Complexity: 1 Low         Acelyn Basham Conception Chancy, PT Acute Rehabilitation Services Pager [phone]  Office [phone]  Neave Lenger D Elonda Husky 06/27/2019, 3:11 PM

## 2019-06-27 NOTE — Progress Notes (Signed)
[facility] PROGRESS NOTE  Gregory Bates XIP:[id number] DOB: 04-20-1941 DOA: 06/25/2019 PCP: Emmaline Kluver, MD  Brief summary   78 y.o.malewithhistory of extensive stage small cell lung cancer on palliative chemotherapy and radiation with history of CAD status post CABG last year, chronic diastolic CHF last EF measured in October 2019 was 24 to 50% with grade 2 diastolic dysfunction, chronic and disease stage III, diabetes mellitus type 2, anemia, paroxysmal atrial fibrillation was brought to the ER after patient had a near syncopal episode. Seems patient complained to his grandson that he was feeling weak after a shower this evening and was noted to have high blood sugar. Later on, his blood sugar improved to 542 and he was given his night time Lantus 25 units. After this, the patient was attempting to ambulate with his walker when he became very weak and fell into grandson's arms but did not reportedly lose consciousness. Patient denies to me any chest pain, but says he did feel dizzy. Denies any reduced PO intake, fevers, chills, cough.  ED Course: In the ER, was noted to have elevated lactate and was given total 750 cc NS bolus due to his CHF, and his lacate has started to improve. He was noted to have borderline low BP, and has orthostasis meeting criteria with tachycardia upon standing.   Assessment/Plan:   Presyncope, multifactorial: Orthostatic hypotension, advanced cancer, afib, hyperglycemia. Neuro exam is non focal. No seizures. Recent brain MRI with widespread mets, edema. patient appeared dehydrated on admission. -received iv fluids. hold anti-hypertensives and diuretic. Obtain pt/ot.   A fib. HR is not well controlled. continue beta blocker and eliquis, amiodarone. Will try to increase metoprolol if BP tolerates.   History of CAD. No acute chest pains. Cont BB, aspirin, Statin   Acute on CKD 3. Improved with iv fluids   Type 2 DM. Hyperglycemia, likely due  to steroids. ha1c--10.8. home regimen Levemir 25 units qHS+ Mealtime novolog 10U TID.  -remains hyperglycemia. Will increase lantus to 30U. Added ISS. Titrate as needed  Lung cancer with brain mets. On Decadron, currently due to restart chemotherapy next week -prognosis is guarded with advanced cancer. Will ask palliative care eval.   Code Status: full; Family Communication: d/w patient, his family, RN (indicate person spoken with, relationship, and if by phone, the number) Disposition Plan: remains inpatient. Obtain pt/ot. Home in 24-48 hrs   Consultants:  Palliative   Procedures:  none  Antibiotics: Anti-infectives (From admission, onward)   None        (indicate start date, and stop date if known)  HPI/Subjective: Remains hyperglycemic, glucose 431.  Denies acute pain. No focal weakness. No dizziness.   Objective: Vitals:   06/27/19 0654 06/27/19 1109  BP:  112/66  Pulse: (!) 103   Resp:    Temp:    SpO2:      Intake/Output Summary (Last 24 hours) at 06/27/2019 1144 Last data filed at 06/26/2019 1808 Gross per 24 hour  Intake -  Output 600 ml  Net -600 ml   Filed Weights   06/26/19 0237  Weight: 69 kg    Exam:   General:  No distress   Cardiovascular: s1,s2, rrr  Respiratory: no wheezing   Abdomen: soft, nt   Musculoskeletal: no leg edema    Data Reviewed: Basic Metabolic Panel: Recent Labs  Lab 06/25/19 2135 06/26/19 0501  NA 133* 134*  K 4.2 4.3  CL 101 101  CO2 23 24  GLUCOSE 248* 326*  BUN  44* 38*  CREATININE 1.53* 1.29*  CALCIUM 8.3* 8.2*   Liver Function Tests: No results for input(s): AST, ALT, ALKPHOS, BILITOT, PROT, ALBUMIN in the last 168 hours. No results for input(s): LIPASE, AMYLASE in the last 168 hours. No results for input(s): AMMONIA in the last 168 hours. CBC: Recent Labs  Lab 06/25/19 2135 06/26/19 0501  WBC 11.2* 10.2  HGB 11.5* 11.1*  HCT 35.8* 34.8*  MCV 93.7 94.3  PLT 122* 126*   Cardiac  Enzymes: No results for input(s): CKTOTAL, CKMB, CKMBINDEX, TROPONINI in the last 168 hours. BNP (last 3 results) Recent Labs    08/09/18 0255 08/13/18 0109 06/25/19 2259  BNP 507.8* 756.6* 924.0*    ProBNP (last 3 results) No results for input(s): PROBNP in the last 8760 hours.  CBG: Recent Labs  Lab 06/26/19 0809 06/26/19 1107 06/26/19 2219 06/27/19 0903 06/27/19 1100  GLUCAP 307* 240* 211* 330* 438*    Recent Results (from the past 240 hour(s))  SARS CORONAVIRUS 2 Nasal Swab Aptima Multi Swab     Status: None   Collection Time: 06/26/19 12:35 AM   Specimen: Aptima Multi Swab; Nasal Swab  Result Value Ref Range Status   SARS Coronavirus 2 NEGATIVE NEGATIVE Final    Comment: (NOTE) SARS-CoV-2 target nucleic acids are NOT DETECTED. The SARS-CoV-2 RNA is generally detectable in upper and lower respiratory specimens during the acute phase of infection. Negative results do not preclude SARS-CoV-2 infection, do not rule out co-infections with other pathogens, and should not be used as the sole basis for treatment or other patient management decisions. Negative results must be combined with clinical observations, patient history, and epidemiological information. The expected result is Negative. Fact Sheet for Patients: SugarRoll.be Fact Sheet for Healthcare Providers: https://www.woods-mathews.com/ This test is not yet approved or cleared by the [place] FDA and  has been authorized for detection and/or diagnosis of SARS-CoV-2 by FDA under an Emergency Use Authorization (EUA). This EUA will remain  in effect (meaning this test can be used) for the duration of the COVID-19 declaration under Section 56 4(b)(1) of the Act, 21 U.S.C. section 360bbb-3(b)(1), unless the authorization is terminated or revoked sooner. Performed at [facility], [place] [address] [place], [place] [address]      Studies: Dg Chest 2  View  Result Date: 06/25/2019 CLINICAL DATA:  Weakness, chest pain EXAM: CHEST - 2 VIEW COMPARISON:  06/04/2019 FINDINGS: Prior CABG. Heart is normal size. Lungs clear. No effusions. No acute bony abnormality. IMPRESSION: No active cardiopulmonary disease. Electronically Signed   By: Rolm Baptise M.D.   On: 06/25/2019 22:57    Scheduled Meds: . amiodarone  200 mg Oral QPC breakfast  . apixaban  5 mg Oral BID  . aspirin EC  81 mg Oral Daily  . atorvastatin  20 mg Oral q1800  . dexamethasone  4 mg Oral Q6H  . docusate sodium  100 mg Oral BID  . insulin aspart  0-15 Units Subcutaneous TID WC  . insulin aspart  0-5 Units Subcutaneous QHS  . insulin aspart  6 Units Subcutaneous TID WC  . insulin detemir  25 Units Subcutaneous QHS  . iron polysaccharides  150 mg Oral QHS  . metoprolol tartrate  25 mg Oral BID  . multivitamin with minerals  1 tablet Oral QHS  . sodium chloride flush  3 mL Intravenous Once   Continuous Infusions:  Active Problems:   Type 2 diabetes mellitus with vascular disease ([place])   HLD (  hyperlipidemia)   Normocytic anemia   Essential hypertension   CAD (coronary artery disease)   PAF (paroxysmal atrial fibrillation) (H[facility])   CKD (chronic kidney disease), stage III ([facility])   S/P CABG x 3   Malignant neoplasm of bronchus of right upper lobe (H[facility])   Leukocytosis   COPD (chronic obstructive pulmonary disease) ([place])   Syncope and collapse   Metastatic cancer to brain ([place])   Syncope   Orthostatic hypotension    Time spent: >25 minutes     Kinnie Feil  [facility] Pager [phone]. If 7PM-7AM, please contact night-coverage at [email or web address], password [facility] 06/27/2019, 11:44 AM  LOS: 1 day

## 2019-06-28 ENCOUNTER — Ambulatory Visit: Payer: Medicare HMO | Admitting: Cardiology

## 2019-06-28 DIAGNOSIS — N183 Chronic kidney disease, stage 3 (moderate): Secondary | ICD-10-CM

## 2019-06-28 DIAGNOSIS — C7931 Secondary malignant neoplasm of brain: Secondary | ICD-10-CM

## 2019-06-28 DIAGNOSIS — I10 Essential (primary) hypertension: Secondary | ICD-10-CM

## 2019-06-28 DIAGNOSIS — C3411 Malignant neoplasm of upper lobe, right bronchus or lung: Secondary | ICD-10-CM

## 2019-06-28 DIAGNOSIS — J449 Chronic obstructive pulmonary disease, unspecified: Secondary | ICD-10-CM

## 2019-06-28 LAB — GLUCOSE, CAPILLARY
Glucose-Capillary: 79 mg/dL (ref 70–99)
Glucose-Capillary: 207 mg/dL — ABNORMAL HIGH (ref 70–99)

## 2019-06-28 MED ORDER — DEXAMETHASONE 4 MG PO TABS: 4.0000 mg | ORAL_TABLET | Freq: Three times a day (TID) | ORAL | 0 refills | Status: AC

## 2019-06-28 NOTE — Discharge Summary (Signed)
Physician Discharge Summary  Gregory Bates ZCH:[id number] DOB: [birth year] DOA: 06/25/2019  PCP: Emmaline Kluver, MD  Admit date: 06/25/2019 Discharge date: 06/28/2019  Admitted From: Home Disposition:  Home  Recommendations for Outpatient Follow-up:  1. Follow up with PCP in 1-2 weeks  Home Health:PT, RN   Discharge Condition:Stable CODE STATUS:Full Diet recommendation: Diabetic   Brief/Interim Summary: 78 y.o.malewithhistory of extensive stage small cell lung cancer on palliative chemotherapyand radiationwith history of CAD status post CABG last year, chronic diastolic CHF last EF measured in October 2019 was 21 to 50% with grade 2 diastolic dysfunction, chronic and disease stage III, diabetes mellitus type 2, anemia, paroxysmal atrial fibrillation was brought to the ER after patient had anearsyncopal episode.Seems patient complained to his grandson that he was feeling weak after a shower this evening and was noted to have high blood sugar. Later on, his blood sugar improved to 542 and he was given his night time Lantus 25 units. After this, the patient was attempting to ambulate with his walker when he became very weak and fell into grandson's arms but did not reportedly lose consciousness. Patient denies to me any chest pain, but says he did feel dizzy. Denies any reduced PO intake, fevers, chills, cough.  ED Course:In the ER, was noted to have elevated lactate and was given total 750 cc NS bolus due to his CHF, and his lacate has started to improve. He was noted to have borderline low BP, and has orthostasis meeting criteria with tachycardia upon standing.  Discharge Diagnoses:  Active Problems:   Type 2 diabetes mellitus with vascular disease (HCC)   HLD (hyperlipidemia)   Normocytic anemia   Essential hypertension   CAD (coronary artery disease)   PAF (paroxysmal atrial fibrillation) (HCC)   CKD (chronic kidney disease), stage III (HCC)   S/P CABG x 3   Malignant  neoplasm of bronchus of right upper lobe (HCC)   Leukocytosis   COPD (chronic obstructive pulmonary disease) (HCC)   Syncope and collapse   Metastatic cancer to brain ([place])   Syncope   Orthostatic hypotension  Presyncope, multifactorial: Orthostatic hypotension, advanced cancer, afib, hyperglycemia. Neuro exam noted to be non focal. No seizures. Recent brain MRI with widespread mets, edema. patient appeared dehydrated on admission. -received iv fluids, remained stable -Therapy eval recs for home health PT.   A fib. HR is not well controlled. continue beta blocker and eliquis, amiodarone. Remained stable at time of d/c  History of CAD. No acute chest pains. Cont BB, aspirin, Statin   Acute on CKD 3. Improved with iv fluids   Type 2 DM. Hyperglycemia, likely due to steroids. ha1c--10.8. home regimen Levemir 25 units qHS+ Mealtime novolog 10U TID.  -remains hyperglycemia. Will increase lantus to 30U. Added ISS. Titrate as needed  Lung cancer with brain mets. On Decadron, currently due to restart chemotherapy next week -discussed with Oncology. Recommendation to decrease decadron to 4mg  tid dosing until outpatient re-eval -prognosis is guarded with advanced cancer.[name] Kitchen    Discharge Instructions  Discharge Instructions    AMB Referral to [place] Management   Complete by: As directed    Please refer to community RN for complex case management services.  Netta Cedars, MSN, RN [facility] Liaison Nurse Mobile Phone [phone]  Toll free office [phone]   Reason for consult: Post hospital follow up   Diagnoses of: Heart Failure   Expected date of contact: 1-3 days (reserved for hospital discharges)     Allergies as  of 06/28/2019      Reactions   Brilinta [ticagrelor] Other (See Comments)   Arthralgias & myalgias   Crestor [rosuvastatin] Other (See Comments)   Myalgias      Medication List    STOP taking these medications   fluconazole 100 MG  tablet Commonly known as: DIFLUCAN   guaiFENesin 600 MG 12 hr tablet Commonly known as: MUCINEX   isosorbide mononitrate 60 MG 24 hr tablet Commonly known as: IMDUR     TAKE these medications   acetaminophen 325 MG tablet Commonly known as: TYLENOL Take 2 tablets (650 mg total) by mouth every 6 (six) hours as needed for mild pain.   albuterol 108 (90 Base) MCG/ACT inhaler Commonly known as: VENTOLIN HFA Inhale 2 puffs into the lungs every 6 (six) hours as needed for wheezing or shortness of breath.   amiodarone 200 MG tablet Commonly known as: PACERONE TAKE 1 TABLET (200 MG TOTAL) BY MOUTH DAILY. What changed: See the new instructions.   apixaban 5 MG Tabs tablet Commonly known as: Eliquis Take 1 tablet (5 mg total) by mouth 2 (two) times daily.   aspirin 81 MG EC tablet Take 1 tablet (81 mg total) by mouth daily.   atorvastatin 20 MG tablet Commonly known as: LIPITOR TAKE 1 TABLET EVERY DAY   cholecalciferol 1000 units tablet Commonly known as: VITAMIN D Take 1,000 Units by mouth every evening.   dexamethasone 4 MG tablet Commonly known as: DECADRON Take 1 tablet (4 mg total) by mouth 3 (three) times daily. What changed: when to take this   fexofenadine 180 MG tablet Commonly known as: ALLEGRA Take 180 mg by mouth daily as needed for allergies.   fluticasone 50 MCG/ACT nasal spray Commonly known as: FLONASE Place 1 spray into both nostrils daily as needed for allergies.   furosemide 20 MG tablet Commonly known as: LASIX Take 1 tablet (20 mg total) by mouth daily as needed for fluid (If 3 - 5 pound fluid weight gain; take lasix and notify physician).   insulin aspart 100 UNIT/ML injection Commonly known as: novoLOG Inject 10 Units into the skin 3 (three) times daily with meals.   insulin glargine 100 UNIT/ML injection Commonly known as: LANTUS Inject 0.25 mLs (25 Units total) into the skin at bedtime.   IRON COMPLEX PO Take 1 tablet by mouth at  bedtime.   metFORMIN 500 MG tablet Commonly known as: GLUCOPHAGE Take 500 mg by mouth 2 (two) times daily.   metoprolol tartrate 25 MG tablet Commonly known as: LOPRESSOR TAKE 1 TABLET TWICE DAILY   multivitamin tablet Take 1 tablet by mouth at bedtime.   nitroGLYCERIN 0.4 MG SL tablet Commonly known as: NITROSTAT Place 1 tablet (0.4 mg total) under the tongue every 5 (five) minutes as needed for chest pain.   ondansetron 4 MG disintegrating tablet Commonly known as: Zofran ODT Take 1 tablet (4 mg total) by mouth every 8 (eight) hours as needed for nausea or vomiting.   prochlorperazine 10 MG tablet Commonly known as: COMPAZINE Take 1 tablet (10 mg total) by mouth every 6 (six) hours as needed for nausea or vomiting.   vitamin E 100 UNIT capsule Take 100 Units by mouth at bedtime.      Follow-up Information    [facility] Follow up.   Specialty: Home Health Services Why: Follow up with Occupational Therapy, Physical Therapy and Nursing  Contact information: [place] [address] [phone]  Street, Sharon Mt, MD. Schedule an appointment as soon as possible for a visit in 2 week(s).   Specialty: Family Medicine Contact information: [place] [address] [phone]        Burnell Blanks, MD .   Specialty: Cardiology Contact information: [address] [place] [place] [address] [phone]        Curt Bears, MD Follow up.   Specialty: Oncology Why: as scheduled Contact information: [address] [place] [address] [phone]          Allergies  Allergen Reactions  . Brilinta [Ticagrelor] Other (See Comments)    Arthralgias & myalgias  . Crestor [Rosuvastatin] Other (See Comments)    Myalgias    Consultations:  Oncology  Procedures/Studies: Dg Chest 2 View  Result Date: 06/25/2019 CLINICAL DATA:  Weakness, chest pain EXAM:  CHEST - 2 VIEW COMPARISON:  06/04/2019 FINDINGS: Prior CABG. Heart is normal size. Lungs clear. No effusions. No acute bony abnormality. IMPRESSION: No active cardiopulmonary disease. Electronically Signed   By: Rolm Baptise M.D.   On: 06/25/2019 22:57   Ct Head Wo Contrast  Result Date: 06/04/2019 CLINICAL DATA:  Fall, history of small cell lung cancer EXAM: CT HEAD WITHOUT CONTRAST TECHNIQUE: Contiguous axial images were obtained from the base of the skull through the vertex without intravenous contrast. COMPARISON:  Brain MRI 08/10/2018 FINDINGS: Brain: New large cavitary lesion in the high RIGHT frontal lobe with thickened wall measures 3.9 x 3.0 cm. There is extensive vasogenic edema associated with this new large lesion. Similar lesion in the LEFT frontal lobe measuring 1.9 x 1.9 cm also with vasogenic edema. Smaller midline LEFT frontal lesion measuring 1.7 cm along the anterior interhemispheric fissure. Additional lesion in the LEFT cerebellum measuring 2.0 cm Lesion in the head of the RIGHT caudate nucleus is high-density measuring 1.7 cm. Basal cisterns are patent.  No hydrocephalus. No acute intracranial hemorrhage.  No intraventricular hemorrhage Vascular: No hyperdense vessel or unexpected calcification. Skull: Normal. Negative for fracture or focal lesion. Sinuses/Orbits: No acute finding. Other: IMPRESSION: 1. Unfortunately, there are new parenchymal cavitary masses with extensive vasogenic edema consistent with multifocal intracranial metastasis with central necrosis. 2. Metastatic lesions include the high RIGHT frontal lobe, anterior LEFT frontal lobe, head of the RIGHT caudate nucleus, and LEFT cerebellum. 3. No hydrocephalus. Basal cisterns patent. No intracranial hemorrhage , Findings [place] 06/04/2019  at18:16. Electronically Signed   By: Suzy Bouchard M.D.   On: 06/04/2019 18:17   Mr Jeri Cos JK Contrast  Result Date: 06/05/2019 CLINICAL DATA:  Small cell lung cancer.   Abnormal CT head EXAM: MRI HEAD WITHOUT AND WITH CONTRAST TECHNIQUE: Multiplanar, multiecho pulse sequences of the brain and surrounding structures were obtained without and with intravenous contrast. CONTRAST:  7 mL Gadovist IV COMPARISON:  CT head 06/04/2019, MRI 08/10/2018 FINDINGS: Brain: Multiple enhancing lesions the brain compatible with metastatic disease. These were noted on the recent CT from yesterday but were not present on the prior MRI of 08/09/2018. Extensive edema in the left frontal lobe and right parietal lobe without midline shift. Left cerebellar rim enhancing mass with central necrosis measures 28 mm with mild surrounding edema and mild hemorrhage. 6 x 15 mm lesion right anterior temporal lobe with mild edema 19 x 12 mm enhancing lesion right head of caudate with surrounding edema. 10 mm rim enhancing lesion left thalamus with mild edema Large necrotic lesion right anterior occipital lobe measures 30 x 42 mm  with extensive vasogenic edema and slight hemorrhage. 10 mm lesion right medial parietal lobe 19 mm left anterior frontal lobe 21 x 26 mm left frontal convexity lesion with extensive edema. 16 mm right lateral parietal lesion Solid enhancing lesion along the lateral sphenoid wing on the left has enlarged since the prior study and now measures 23 x 17 mm and shows susceptibility and is calcified on CT. This is consistent with meningioma which has grown in the interval. Coronal images demonstrate the this appears to be extra-axial. Vascular: Normal arterial flow voids Skull and upper cervical spine: No worrisome skeletal lesions. Sinuses/Orbits: Negative Other: None IMPRESSION: Widespread metastatic disease has developed since the prior MRI of 08/09/2018. At least 9 lesions are seen. There is a large amount of vasogenic edema in the left frontal lobe and right parietal lobe without midline shift. Solid enhancing lesion along the lateral sphenoid wing has grown in the interval and is most  consistent with meningioma. Electronically Signed   By: Franchot Gallo M.D.   On: 06/05/2019 12:18   Dg Chest Port 1 View  Result Date: 06/04/2019 CLINICAL DATA:  Syncope.  History of lung carcinoma EXAM: PORTABLE CHEST 1 VIEW COMPARISON:  Chest CT May 25, 2019; chest radiograph September 16, 2019 FINDINGS: There are areas of ill-defined opacity in the right base, left base, and left mid lung. The apparent consolidation seen in the left mid lung on recent CT is not appreciable by radiography. There is underlying emphysematous change, better delineated on recent CT. Heart size and pulmonary vascularity are normal. No adenopathy is identified by radiography. Patient is status post coronary artery bypass grafting. There is a left atrial appendage clamp present. There is aortic atherosclerosis. No bone lesions. IMPRESSION: Areas of ill-defined opacity in the lung bases and left mid lung regions. No consolidation or masses evident by radiography. Cardiac silhouette within normal limits. No adenopathy appreciable. Postoperative changes noted. Aortic Atherosclerosis (ICD10-I70.0) and Emphysema (ICD10-J43.9). Electronically Signed   By: Lowella Grip III M.D.   On: 06/04/2019 17:29    Subjective: Eager to go home  Discharge Exam: Vitals:   06/27/19 2204 06/28/19 0431  BP: 122/73 109/62  Pulse: 91 (!) 57  Resp: 20 16  Temp: 98.3 F (36.8 C) 98.1 F (36.7 C)  SpO2: 100% 100%   Vitals:   06/27/19 1109 06/27/19 1439 06/27/19 2204 06/28/19 0431  BP: 112/66 (!) 100/50 122/73 109/62  Pulse:  72 91 (!) 57  Resp:  17 20 16   Temp:   98.3 F (36.8 C) 98.1 F (36.7 C)  TempSrc:   Oral Oral  SpO2:  100% 100% 100%  Weight:      Height:        General: Pt is alert, awake, not in acute distress Cardiovascular: RRR, S1/S2 +, no rubs, no gallops Respiratory: CTA bilaterally, no wheezing, no rhonchi Abdominal: Soft, NT, ND, bowel sounds + Extremities: no edema, no cyanosis   The results of  significant diagnostics from this hospitalization (including imaging, microbiology, ancillary and laboratory) are listed below for reference.     Microbiology: Recent Results (from the past 240 hour(s))  SARS CORONAVIRUS 2 Nasal Swab Aptima Multi Swab     Status: None   Collection Time: 06/26/19 12:35 AM   Specimen: Aptima Multi Swab; Nasal Swab  Result Value Ref Range Status   SARS Coronavirus 2 NEGATIVE NEGATIVE Final    Comment: (NOTE) SARS-CoV-2 target nucleic acids are NOT DETECTED. The SARS-CoV-2 RNA is generally detectable in upper  and lower respiratory specimens during the acute phase of infection. Negative results do not preclude SARS-CoV-2 infection, do not rule out co-infections with other pathogens, and should not be used as the sole basis for treatment or other patient management decisions. Negative results must be combined with clinical observations, patient history, and epidemiological information. The expected result is Negative. Fact Sheet for Patients: SugarRoll.be Fact Sheet for Healthcare Providers: https://www.woods-mathews.com/ This test is not yet approved or cleared by the [place] FDA and  has been authorized for detection and/or diagnosis of SARS-CoV-2 by FDA under an Emergency Use Authorization (EUA). This EUA will remain  in effect (meaning this test can be used) for the duration of the COVID-19 declaration under Section 56 4(b)(1) of the Act, 21 U.S.C. section 360bbb-3(b)(1), unless the authorization is terminated or revoked sooner. Performed at [facility], [place] [address], [place], [place] [address]      Labs: BNP (last 3 results) Recent Labs    08/09/18 0255 08/13/18 0109 06/25/19 2259  BNP 507.8* 756.6* 188.4*   Basic Metabolic Panel: Recent Labs  Lab 06/25/19 2135 06/26/19 0501  NA 133* 134*  K 4.2 4.3  CL 101 101  CO2 23 24  GLUCOSE 248* 326*  BUN 44* 38*  CREATININE 1.53*  1.29*  CALCIUM 8.3* 8.2*   Liver Function Tests: No results for input(s): AST, ALT, ALKPHOS, BILITOT, PROT, ALBUMIN in the last 168 hours. No results for input(s): LIPASE, AMYLASE in the last 168 hours. No results for input(s): AMMONIA in the last 168 hours. CBC: Recent Labs  Lab 06/25/19 2135 06/26/19 0501  WBC 11.2* 10.2  HGB 11.5* 11.1*  HCT 35.8* 34.8*  MCV 93.7 94.3  PLT 122* 126*   Cardiac Enzymes: No results for input(s): CKTOTAL, CKMB, CKMBINDEX, TROPONINI in the last 168 hours. BNP: Invalid input(s): POCBNP CBG: Recent Labs  Lab 06/27/19 1100 06/27/19 1148 06/27/19 1637 06/27/19 2202 06/28/19 0746  GLUCAP 438* 481* 216* 196* 79   D-Dimer No results for input(s): DDIMER in the last 72 hours. Hgb A1c No results for input(s): HGBA1C in the last 72 hours. Lipid Profile No results for input(s): CHOL, HDL, LDLCALC, TRIG, CHOLHDL, LDLDIRECT in the last 72 hours. Thyroid function studies No results for input(s): TSH, T4TOTAL, T3FREE, THYROIDAB in the last 72 hours.  Invalid input(s): FREET3 Anemia work up No results for input(s): VITAMINB12, FOLATE, FERRITIN, TIBC, IRON, RETICCTPCT in the last 72 hours. Urinalysis    Component Value Date/Time   COLORURINE YELLOW 06/25/2019 2135   APPEARANCEUR CLEAR 06/25/2019 2135   LABSPEC 1.014 06/25/2019 2135   PHURINE 5.0 06/25/2019 2135   GLUCOSEU >=500 (A) 06/25/2019 2135   HGBUR NEGATIVE 06/25/2019 2135   BILIRUBINUR NEGATIVE 06/25/2019 2135   Will NEGATIVE 06/25/2019 2135   PROTEINUR NEGATIVE 06/25/2019 2135   NITRITE NEGATIVE 06/25/2019 2135   LEUKOCYTESUR NEGATIVE 06/25/2019 2135   Sepsis Labs Invalid input(s): PROCALCITONIN,  WBC,  LACTICIDVEN Microbiology Recent Results (from the past 240 hour(s))  SARS CORONAVIRUS 2 Nasal Swab Aptima Multi Swab     Status: None   Collection Time: 06/26/19 12:35 AM   Specimen: Aptima Multi Swab; Nasal Swab  Result Value Ref Range Status   SARS Coronavirus 2 NEGATIVE  NEGATIVE Final    Comment: (NOTE) SARS-CoV-2 target nucleic acids are NOT DETECTED. The SARS-CoV-2 RNA is generally detectable in upper and lower respiratory specimens during the acute phase of infection. Negative results do not preclude SARS-CoV-2 infection, do not rule out co-infections with  other pathogens, and should not be used as the sole basis for treatment or other patient management decisions. Negative results must be combined with clinical observations, patient history, and epidemiological information. The expected result is Negative. Fact Sheet for Patients: SugarRoll.be Fact Sheet for Healthcare Providers: https://www.woods-mathews.com/ This test is not yet approved or cleared by the [place] FDA and  has been authorized for detection and/or diagnosis of SARS-CoV-2 by FDA under an Emergency Use Authorization (EUA). This EUA will remain  in effect (meaning this test can be used) for the duration of the COVID-19 declaration under Section 56 4(b)(1) of the Act, 21 U.S.C. section 360bbb-3(b)(1), unless the authorization is terminated or revoked sooner. Performed at [facility], [place] [address], [place], [place] [address]    Time spent: 30 min  SIGNED:   Marylu Lund, MD  [facility] 06/28/2019, 11:50 AM  If 7PM-7AM, please contact night-coverage

## 2019-06-28 NOTE — Progress Notes (Signed)
Palliative consult received.  D/w Dr. Wyline Copas.  Patient discharging today with plan for follow-up with oncology next week and will therefore hold on palliative consult at this time.  If oncology feels that palliative involvement would be beneficial, I would recommend referral for outpatient palliative care to see Gregory Bates.  It appears that he is [facility] patient, and therefore he would be eligible to enroll in [facility] through [facility] if oncology team feels referral is appropriate.   Micheline Rough, MD [facility] Team [phone]  NO CHARGE NOTE

## 2019-06-28 NOTE — Consult Note (Addendum)
   [facility] CM Inpatient Consult   06/28/2019  JAGER KOSKA October 23, 1941 [id number]  Patient screened for potential [facility] Care Management services due to unplanned readmission risk score of 50%, extreme, and 30 day unplanned readmission.   Mr. Yadav had been engaged with [facility] community RN in the past. Will continue to follow for progression and disposition plans.  [facility] Follow up: 11:22pm  Spoke with Mr. Halbig by telephone, explained [facility] CM services, and assessed for community needs once patient transitions to home. Patient states that he would like community RN to follow up with him when he returns home for medication review. Consent on file. Patient gives consent to speak with his wife.   Referral placed to [facility] community RN follow up.  Of note, [facility] Care Management services does not replace or interfere with any services that are arranged by inpatient case management or social work.  Netta Cedars, MSN, [facility] Liaison Nurse Mobile Phone [phone]  Toll free office [phone]

## 2019-06-28 NOTE — Progress Notes (Signed)
HEMATOLOGY-ONCOLOGY PROGRESS NOTE  SUBJECTIVE: Notified by hospitalist that the patient was admitted.  The patient has been under our care for management of his extensive stage small cell lung cancer.  Recently completed radiation to his brain for brain metastases.  This was completed on 06/20/2019.  The patient was admitted for generalized weakness and near syncopal episode.  He has been having some issues with hypoglycemia at home.  When seen today, he states that he feels much better.  He would like to be discharged home today if possible.  He states that he was able to ambulate in the hallway with a walker.  He did not notice any weakness or presyncopal episodes.  He offers no other complaints this morning.  Oncology History  Extensive stage primary small cell carcinoma of lung ([place])  08/11/2018 Initial Diagnosis   Small cell carcinoma of right lung ([place])   08/29/2018 -  Chemotherapy   The patient had palonosetron (ALOXI) injection 0.25 mg, 0.25 mg, Intravenous,  Once, 4 of 4 cycles Administration: 0.25 mg (08/29/2018), 0.25 mg (09/19/2018), 0.25 mg (10/11/2018), 0.25 mg (10/31/2018) pegfilgrastim-cbqv (UDENYCA) injection 6 mg, 6 mg, Subcutaneous, Once, 4 of 4 cycles Administration: 6 mg (09/02/2018), 6 mg (09/23/2018), 6 mg (10/15/2018), 6 mg (11/05/2018) CARBOplatin (PARAPLATIN) 370 mg in sodium chloride 0.9 % 250 mL chemo infusion, 370 mg (111.5 % of original dose 334.5 mg), Intravenous,  Once, 4 of 4 cycles Dose modification: 334.5 mg (original dose 334.5 mg, Cycle 1), 373 mg (original dose 334.5 mg, Cycle 1, Reason: Change in SCr/CrCl), 379.5 mg (original dose 379.5 mg, Cycle 2), 379.5 mg (original dose 379.5 mg, Cycle 3), 408.5 mg (original dose 408.5 mg, Cycle 4) Administration: 370 mg (08/29/2018), 350 mg (09/19/2018), 380 mg (10/11/2018), 410 mg (10/31/2018) etoposide (VEPESID) 200 mg in sodium chloride 0.9 % 500 mL chemo infusion, 100 mg/m2 = 210 mg, Intravenous,  Once, 4 of 4  cycles Administration: 200 mg (08/29/2018), 200 mg (08/31/2018), 200 mg (09/19/2018), 200 mg (09/20/2018), 200 mg (09/05/2018), 200 mg (10/11/2018), 200 mg (10/12/2018), 200 mg (10/13/2018), 200 mg (10/31/2018), 200 mg (11/01/2018), 200 mg (11/03/2018) fosaprepitant (EMEND) 150 mg, dexamethasone (DECADRON) 12 mg in sodium chloride 0.9 % 145 mL IVPB, , Intravenous,  Once, 4 of 4 cycles Administration:  (08/29/2018),  (09/19/2018),  (10/11/2018),  (10/31/2018) atezolizumab (TECENTRIQ) 1,200 mg in sodium chloride 0.9 % 250 mL chemo infusion, 1,200 mg, Intravenous, Once, 12 of 19 cycles Administration: 1,200 mg (08/29/2018), 1,200 mg (09/19/2018), 1,200 mg (10/11/2018), 1,200 mg (10/31/2018), 1,200 mg (11/21/2018), 1,200 mg (12/12/2018), 1,200 mg (01/02/2019), 1,200 mg (01/23/2019), 1,200 mg (02/13/2019), 1,200 mg (03/06/2019), 1,200 mg (03/29/2019), 1,200 mg (05/29/2019)  for chemotherapy treatment.       REVIEW OF SYSTEMS:   Constitutional: Denies fevers, chills  Respiratory: Denies cough, dyspnea or wheezes Cardiovascular: Denies palpitation, chest discomfort Gastrointestinal:  Denies nausea, heartburn or change in bowel habits Skin: Denies abnormal skin rashes Lymphatics: Denies new lymphadenopathy or easy bruising Neurological: Denies dizziness and weakness Behavioral/Psych: Mood is stable, no new changes  Extremities: No lower extremity edema All other systems were reviewed with the patient and are negative.  I have reviewed the past medical history, past surgical history, social history and family history with the patient and they are unchanged from previous note.   PHYSICAL EXAMINATION: ECOG PERFORMANCE STATUS: 1 - Symptomatic but completely ambulatory  Vitals:   06/27/19 2204 06/28/19 0431  BP: 122/73 109/62  Pulse: 91 (!) 57  Resp: 20 16  Temp: 98.3 F (  36.8 C) 98.1 F (36.7 C)  SpO2: 100% 100%   Filed Weights   06/26/19 0237  Weight: 152 lb 1.9 oz (69 kg)    Intake/Output from  previous day: 08/25 0701 - 08/26 0700 In: 600 [P.O.:600] Out: 0   GENERAL:alert, no distress and comfortable SKIN: skin color, texture, turgor are normal, no rashes or significant lesions EYES: normal, Conjunctiva are pink and non-injected, sclera clear OROPHARYNX:no exudate, no erythema and lips, buccal mucosa, and tongue normal  NECK: supple, thyroid normal size, non-tender, without nodularity LYMPH:  no palpable lymphadenopathy in the cervical, axillary or inguinal LUNGS: clear to auscultation and percussion with normal breathing effort HEART: regular rate & rhythm and no murmurs and no lower extremity edema ABDOMEN:abdomen soft, non-tender and normal bowel sounds Musculoskeletal:no cyanosis of digits and no clubbing  NEURO: alert & oriented x 3 with fluent speech, no focal motor/sensory deficits  LABORATORY DATA:  I have reviewed the data as listed CMP Latest Ref Rng & Units 06/26/2019 06/25/2019 06/08/2019  Glucose 70 - 99 mg/dL 326(H) 248(H) 195(H)  BUN 8 - 23 mg/dL 38(H) 44(H) 31(H)  Creatinine 0.61 - 1.24 mg/dL 1.29(H) 1.53(H) 1.23  Sodium 135 - 145 mmol/L 134(L) 133(L) 134(L)  Potassium 3.5 - 5.1 mmol/L 4.3 4.2 4.6  Chloride 98 - 111 mmol/L 101 101 102  CO2 22 - 32 mmol/L 24 23 23   Calcium 8.9 - 10.3 mg/dL 8.2(L) 8.3(L) 8.8(L)  Total Protein 6.5 - 8.1 g/dL - - -  Total Bilirubin 0.3 - 1.2 mg/dL - - -  Alkaline Phos 38 - 126 U/L - - -  AST 15 - 41 U/L - - -  ALT 0 - 44 U/L - - -    Lab Results  Component Value Date   WBC 10.2 06/26/2019   HGB 11.1 (L) 06/26/2019   HCT 34.8 (L) 06/26/2019   MCV 94.3 06/26/2019   PLT 126 (L) 06/26/2019   NEUTROABS 9.0 (H) 06/08/2019    Dg Chest 2 View  Result Date: 06/25/2019 CLINICAL DATA:  Weakness, chest pain EXAM: CHEST - 2 VIEW COMPARISON:  06/04/2019 FINDINGS: Prior CABG. Heart is normal size. Lungs clear. No effusions. No acute bony abnormality. IMPRESSION: No active cardiopulmonary disease. Electronically Signed   By: Rolm Baptise M.D.   On: 06/25/2019 22:57   Ct Head Wo Contrast  Result Date: 06/04/2019 CLINICAL DATA:  Fall, history of small cell lung cancer EXAM: CT HEAD WITHOUT CONTRAST TECHNIQUE: Contiguous axial images were obtained from the base of the skull through the vertex without intravenous contrast. COMPARISON:  Brain MRI 08/10/2018 FINDINGS: Brain: New large cavitary lesion in the high RIGHT frontal lobe with thickened wall measures 3.9 x 3.0 cm. There is extensive vasogenic edema associated with this new large lesion. Similar lesion in the LEFT frontal lobe measuring 1.9 x 1.9 cm also with vasogenic edema. Smaller midline LEFT frontal lesion measuring 1.7 cm along the anterior interhemispheric fissure. Additional lesion in the LEFT cerebellum measuring 2.0 cm Lesion in the head of the RIGHT caudate nucleus is high-density measuring 1.7 cm. Basal cisterns are patent.  No hydrocephalus. No acute intracranial hemorrhage.  No intraventricular hemorrhage Vascular: No hyperdense vessel or unexpected calcification. Skull: Normal. Negative for fracture or focal lesion. Sinuses/Orbits: No acute finding. Other: IMPRESSION: 1. Unfortunately, there are new parenchymal cavitary masses with extensive vasogenic edema consistent with multifocal intracranial metastasis with central necrosis. 2. Metastatic lesions include the high RIGHT frontal lobe, anterior LEFT frontal lobe, head of the  RIGHT caudate nucleus, and LEFT cerebellum. 3. No hydrocephalus. Basal cisterns patent. No intracranial hemorrhage , Findings [place] 06/04/2019  at18:16. Electronically Signed   By: Suzy Bouchard M.D.   On: 06/04/2019 18:17   Mr Jeri Cos [place] Contrast  Result Date: 06/05/2019 CLINICAL DATA:  Small cell lung cancer.  Abnormal CT head EXAM: MRI HEAD WITHOUT AND WITH CONTRAST TECHNIQUE: Multiplanar, multiecho pulse sequences of the brain and surrounding structures were obtained without and with intravenous contrast. CONTRAST:  7 mL  Gadovist IV COMPARISON:  CT head 06/04/2019, MRI 08/10/2018 FINDINGS: Brain: Multiple enhancing lesions the brain compatible with metastatic disease. These were noted on the recent CT from yesterday but were not present on the prior MRI of 08/09/2018. Extensive edema in the left frontal lobe and right parietal lobe without midline shift. Left cerebellar rim enhancing mass with central necrosis measures 28 mm with mild surrounding edema and mild hemorrhage. 6 x 15 mm lesion right anterior temporal lobe with mild edema 19 x 12 mm enhancing lesion right head of caudate with surrounding edema. 10 mm rim enhancing lesion left thalamus with mild edema Large necrotic lesion right anterior occipital lobe measures 30 x 42 mm with extensive vasogenic edema and slight hemorrhage. 10 mm lesion right medial parietal lobe 19 mm left anterior frontal lobe 21 x 26 mm left frontal convexity lesion with extensive edema. 16 mm right lateral parietal lesion Solid enhancing lesion along the lateral sphenoid wing on the left has enlarged since the prior study and now measures 23 x 17 mm and shows susceptibility and is calcified on CT. This is consistent with meningioma which has grown in the interval. Coronal images demonstrate the this appears to be extra-axial. Vascular: Normal arterial flow voids Skull and upper cervical spine: No worrisome skeletal lesions. Sinuses/Orbits: Negative Other: None IMPRESSION: Widespread metastatic disease has developed since the prior MRI of 08/09/2018. At least 9 lesions are seen. There is a large amount of vasogenic edema in the left frontal lobe and right parietal lobe without midline shift. Solid enhancing lesion along the lateral sphenoid wing has grown in the interval and is most consistent with meningioma. Electronically Signed   By: Franchot Gallo M.D.   On: 06/05/2019 12:18   Dg Chest Port 1 View  Result Date: 06/04/2019 CLINICAL DATA:  Syncope.  History of lung carcinoma EXAM: PORTABLE CHEST  1 VIEW COMPARISON:  Chest CT May 25, 2019; chest radiograph September 16, 2019 FINDINGS: There are areas of ill-defined opacity in the right base, left base, and left mid lung. The apparent consolidation seen in the left mid lung on recent CT is not appreciable by radiography. There is underlying emphysematous change, better delineated on recent CT. Heart size and pulmonary vascularity are normal. No adenopathy is identified by radiography. Patient is status post coronary artery bypass grafting. There is a left atrial appendage clamp present. There is aortic atherosclerosis. No bone lesions. IMPRESSION: Areas of ill-defined opacity in the lung bases and left mid lung regions. No consolidation or masses evident by radiography. Cardiac silhouette within normal limits. No adenopathy appreciable. Postoperative changes noted. Aortic Atherosclerosis (ICD10-I70.0) and Emphysema (ICD10-J43.9). Electronically Signed   By: Lowella Grip III M.D.   On: 06/04/2019 17:29    ASSESSMENT AND PLAN: 1.  Extensive stage small cell lung cancer 2.  Presyncope 3.  Atrial fibrillation 4.  Acute on chronic kidney disease 5.  Diabetes mellitus  -Gregory Bates recently completed brain radiation which he tolerated well.  He  will follow-up with radiation oncology as an outpatient. -We will plan to resume chemotherapy as previously scheduled on 07/04/2019.  He will keep his outpatient follow-up as scheduled. -I note that he has been on dexamethasone 4 mg every 6 hours since at least August 6th.  Since he has completed radiation to his brain, I would recommend decreasing dexamethasone to 4 mg 3 times a day.  When he is seen next week in our office, we will continue to taper this.    LOS: 2 days   Gregory Bussing, DNP, AGPCNP-BC, AOCNP 06/28/19

## 2019-06-28 NOTE — Progress Notes (Signed)
Patient D/C'd home as ordered. All D/C paperwork explained to pt and wife. Both verbalized understanding. HH arranged per SW. Pt assisted to wife's vehicle by W/C with assist from nursing staff.

## 2019-06-29 ENCOUNTER — Ambulatory Visit: Payer: Self-pay

## 2019-06-29 LAB — GLUCOSE, CAPILLARY
Glucose-Capillary: 141 mg/dL — ABNORMAL HIGH (ref 70–99)
Glucose-Capillary: 115 mg/dL — ABNORMAL HIGH (ref 70–99)

## 2019-06-30 DIAGNOSIS — M170 Bilateral primary osteoarthritis of knee: Secondary | ICD-10-CM | POA: Diagnosis not present

## 2019-06-30 DIAGNOSIS — M19072 Primary osteoarthritis, left ankle and foot: Secondary | ICD-10-CM | POA: Diagnosis not present

## 2019-06-30 DIAGNOSIS — I130 Hypertensive heart and chronic kidney disease with heart failure and stage 1 through stage 4 chronic kidney disease, or unspecified chronic kidney disease: Secondary | ICD-10-CM | POA: Diagnosis not present

## 2019-06-30 DIAGNOSIS — C3411 Malignant neoplasm of upper lobe, right bronchus or lung: Secondary | ICD-10-CM | POA: Diagnosis not present

## 2019-06-30 DIAGNOSIS — D630 Anemia in neoplastic disease: Secondary | ICD-10-CM | POA: Diagnosis not present

## 2019-06-30 DIAGNOSIS — I0981 Rheumatic heart failure: Secondary | ICD-10-CM | POA: Diagnosis not present

## 2019-06-30 DIAGNOSIS — C7931 Secondary malignant neoplasm of brain: Secondary | ICD-10-CM | POA: Diagnosis not present

## 2019-06-30 DIAGNOSIS — M19071 Primary osteoarthritis, right ankle and foot: Secondary | ICD-10-CM | POA: Diagnosis not present

## 2019-06-30 DIAGNOSIS — I5042 Chronic combined systolic (congestive) and diastolic (congestive) heart failure: Secondary | ICD-10-CM | POA: Diagnosis not present

## 2019-07-03 ENCOUNTER — Other Ambulatory Visit: Payer: Self-pay

## 2019-07-03 DIAGNOSIS — D630 Anemia in neoplastic disease: Secondary | ICD-10-CM | POA: Diagnosis not present

## 2019-07-03 DIAGNOSIS — M19072 Primary osteoarthritis, left ankle and foot: Secondary | ICD-10-CM | POA: Diagnosis not present

## 2019-07-03 DIAGNOSIS — M19071 Primary osteoarthritis, right ankle and foot: Secondary | ICD-10-CM | POA: Diagnosis not present

## 2019-07-03 DIAGNOSIS — C7931 Secondary malignant neoplasm of brain: Secondary | ICD-10-CM | POA: Diagnosis not present

## 2019-07-03 DIAGNOSIS — M170 Bilateral primary osteoarthritis of knee: Secondary | ICD-10-CM | POA: Diagnosis not present

## 2019-07-03 DIAGNOSIS — I130 Hypertensive heart and chronic kidney disease with heart failure and stage 1 through stage 4 chronic kidney disease, or unspecified chronic kidney disease: Secondary | ICD-10-CM | POA: Diagnosis not present

## 2019-07-03 DIAGNOSIS — I0981 Rheumatic heart failure: Secondary | ICD-10-CM | POA: Diagnosis not present

## 2019-07-03 DIAGNOSIS — I5042 Chronic combined systolic (congestive) and diastolic (congestive) heart failure: Secondary | ICD-10-CM | POA: Diagnosis not present

## 2019-07-03 DIAGNOSIS — C3411 Malignant neoplasm of upper lobe, right bronchus or lung: Secondary | ICD-10-CM | POA: Diagnosis not present

## 2019-07-03 NOTE — Patient Outreach (Signed)
New referral:  Discharged on 06/28/2019 with dehydration   MD office does transition of care.  Well known patient to me. Placed call to patient and he identified himself.  Reviewed reason for call and recent admission. Patient agreed to re enrollment into program. Patient reports that he is doing well. Reports that he feels "GREAT".  States that his weight is up a little but denies swelling. Reports breathing is " good".  States currently taking chemo but no radiation.  Reviewed CBG levels and patient reports his CBG are running high because of his steroids.  Reports todays CBG of 300.    Patient reports follow up planned tomorrow with oncology and primary MD appointment on 07/05/2019.  Ensured transportation.    PLAN: will plan follow up call in 1 week. Will send welcome letter to patient and barrier letter to MD.  Tomasa Rand, RN, BSN, CEN [place] Coordinator [phone]

## 2019-07-04 ENCOUNTER — Other Ambulatory Visit: Payer: Self-pay

## 2019-07-04 ENCOUNTER — Inpatient Hospital Stay: Payer: Medicare HMO

## 2019-07-04 ENCOUNTER — Encounter: Payer: Self-pay | Admitting: Internal Medicine

## 2019-07-04 ENCOUNTER — Inpatient Hospital Stay (HOSPITAL_BASED_OUTPATIENT_CLINIC_OR_DEPARTMENT_OTHER): Payer: Medicare HMO | Admitting: Internal Medicine

## 2019-07-04 ENCOUNTER — Inpatient Hospital Stay: Payer: Medicare HMO | Attending: Internal Medicine

## 2019-07-04 VITALS — BP 109/59 | HR 67 | Temp 99.8°F | Resp 17 | Ht 72.0 in | Wt 156.9 lb

## 2019-07-04 DIAGNOSIS — Z79899 Other long term (current) drug therapy: Secondary | ICD-10-CM | POA: Diagnosis not present

## 2019-07-04 DIAGNOSIS — C3490 Malignant neoplasm of unspecified part of unspecified bronchus or lung: Secondary | ICD-10-CM | POA: Diagnosis not present

## 2019-07-04 DIAGNOSIS — E119 Type 2 diabetes mellitus without complications: Secondary | ICD-10-CM | POA: Insufficient documentation

## 2019-07-04 DIAGNOSIS — C7931 Secondary malignant neoplasm of brain: Secondary | ICD-10-CM | POA: Diagnosis not present

## 2019-07-04 DIAGNOSIS — Z5112 Encounter for antineoplastic immunotherapy: Secondary | ICD-10-CM

## 2019-07-04 DIAGNOSIS — Z7982 Long term (current) use of aspirin: Secondary | ICD-10-CM | POA: Diagnosis not present

## 2019-07-04 DIAGNOSIS — C3411 Malignant neoplasm of upper lobe, right bronchus or lung: Secondary | ICD-10-CM | POA: Diagnosis not present

## 2019-07-04 DIAGNOSIS — E1159 Type 2 diabetes mellitus with other circulatory complications: Secondary | ICD-10-CM | POA: Diagnosis not present

## 2019-07-04 DIAGNOSIS — I252 Old myocardial infarction: Secondary | ICD-10-CM | POA: Insufficient documentation

## 2019-07-04 DIAGNOSIS — Z7901 Long term (current) use of anticoagulants: Secondary | ICD-10-CM | POA: Diagnosis not present

## 2019-07-04 DIAGNOSIS — I10 Essential (primary) hypertension: Secondary | ICD-10-CM | POA: Insufficient documentation

## 2019-07-04 DIAGNOSIS — Z794 Long term (current) use of insulin: Secondary | ICD-10-CM | POA: Diagnosis not present

## 2019-07-04 DIAGNOSIS — E785 Hyperlipidemia, unspecified: Secondary | ICD-10-CM | POA: Diagnosis not present

## 2019-07-04 DIAGNOSIS — I480 Paroxysmal atrial fibrillation: Secondary | ICD-10-CM | POA: Diagnosis not present

## 2019-07-04 DIAGNOSIS — J439 Emphysema, unspecified: Secondary | ICD-10-CM | POA: Insufficient documentation

## 2019-07-04 DIAGNOSIS — C7951 Secondary malignant neoplasm of bone: Secondary | ICD-10-CM | POA: Insufficient documentation

## 2019-07-04 LAB — CBC WITH DIFFERENTIAL (CANCER CENTER ONLY)
Abs Immature Granulocytes: 0.3 10*3/uL — ABNORMAL HIGH (ref 0.00–0.07)
Basophils Absolute: 0 10*3/uL (ref 0.0–0.1)
Basophils Relative: 0 %
Eosinophils Absolute: 0 10*3/uL (ref 0.0–0.5)
Eosinophils Relative: 0 %
HCT: 35.4 % — ABNORMAL LOW (ref 39.0–52.0)
Hemoglobin: 11.8 g/dL — ABNORMAL LOW (ref 13.0–17.0)
Immature Granulocytes: 4 %
Lymphocytes Relative: 7 %
Lymphs Abs: 0.5 10*3/uL — ABNORMAL LOW (ref 0.7–4.0)
MCH: 30.9 pg (ref 26.0–34.0)
MCHC: 33.3 g/dL (ref 30.0–36.0)
MCV: 92.7 fL (ref 80.0–100.0)
Monocytes Absolute: 0.5 10*3/uL (ref 0.1–1.0)
Monocytes Relative: 7 %
Neutro Abs: 6 10*3/uL (ref 1.7–7.7)
Neutrophils Relative %: 82 %
Platelet Count: 168 10*3/uL (ref 150–400)
RBC: 3.82 MIL/uL — ABNORMAL LOW (ref 4.22–5.81)
RDW: 17.3 % — ABNORMAL HIGH (ref 11.5–15.5)
WBC Count: 7.4 10*3/uL (ref 4.0–10.5)
nRBC: 0 % (ref 0.0–0.2)

## 2019-07-04 LAB — CMP (CANCER CENTER ONLY)
ALT: 35 U/L (ref 0–44)
AST: 14 U/L — ABNORMAL LOW (ref 15–41)
Albumin: 2.9 g/dL — ABNORMAL LOW (ref 3.5–5.0)
Alkaline Phosphatase: 98 U/L (ref 38–126)
Anion gap: 9 (ref 5–15)
BUN: 27 mg/dL — ABNORMAL HIGH (ref 8–23)
CO2: 23 mmol/L (ref 22–32)
Calcium: 8.1 mg/dL — ABNORMAL LOW (ref 8.9–10.3)
Chloride: 98 mmol/L (ref 98–111)
Creatinine: 1.38 mg/dL — ABNORMAL HIGH (ref 0.61–1.24)
GFR, Est AFR Am: 56 mL/min — ABNORMAL LOW (ref >60)
GFR, Estimated: 49 mL/min — ABNORMAL LOW (ref >60)
Glucose, Bld: 261 mg/dL — ABNORMAL HIGH (ref 70–99)
Potassium: 4.6 mmol/L (ref 3.5–5.1)
Sodium: 130 mmol/L — ABNORMAL LOW (ref 135–145)
Total Bilirubin: 0.5 mg/dL (ref 0.3–1.2)
Total Protein: 5.7 g/dL — ABNORMAL LOW (ref 6.5–8.1)

## 2019-07-04 LAB — TSH: TSH: 0.27 u[IU]/mL — ABNORMAL LOW (ref 0.320–4.118)

## 2019-07-04 MED ORDER — SODIUM CHLORIDE 0.9 % IV SOLN
Freq: Once | INTRAVENOUS | Status: AC
  Administered 2019-07-04: 16:00:00 via INTRAVENOUS
  Filled 2019-07-04: qty 250

## 2019-07-04 MED ORDER — SODIUM CHLORIDE 0.9 % IV SOLN
1200.0000 mg | Freq: Once | INTRAVENOUS | Status: AC
  Administered 2019-07-04: 1200 mg via INTRAVENOUS
  Filled 2019-07-04: qty 20

## 2019-07-04 NOTE — Patient Instructions (Signed)
[facility] Discharge Instructions for Patients Receiving Chemotherapy  Today you received the following chemotherapy agents Tecentriq  To help prevent nausea and vomiting after your treatment, we encourage you to take your nausea medication as prescribed.   If you develop nausea and vomiting that is not controlled by your nausea medication, call the clinic.   BELOW ARE SYMPTOMS THAT SHOULD BE REPORTED IMMEDIATELY:  *FEVER GREATER THAN 100.5 F  *CHILLS WITH OR WITHOUT FEVER  NAUSEA AND VOMITING THAT IS NOT CONTROLLED WITH YOUR NAUSEA MEDICATION  *UNUSUAL SHORTNESS OF BREATH  *UNUSUAL BRUISING OR BLEEDING  TENDERNESS IN MOUTH AND THROAT WITH OR WITHOUT PRESENCE OF ULCERS  *URINARY PROBLEMS  *BOWEL PROBLEMS  UNUSUAL RASH Items with * indicate a potential emergency and should be followed up as soon as possible.  Feel free to call the clinic should you have any questions or concerns. The clinic phone number is [phone].  Please show the [facility] at check-in to the Emergency Department and triage nurse.  Atezolizumab injection(Tecentriq) What is this medicine? ATEZOLIZUMAB (a te zoe LIZ ue mab) is a monoclonal antibody. It is used to treat bladder cancer (urothelial cancer), non-small cell lung cancer, small cell lung cancer, and breast cancer. This medicine may be used for other purposes; ask your health care provider or pharmacist if you have questions. COMMON BRAND NAME(S): Tecentriq What should I tell my health care provider before I take this medicine? They need to know if you have any of these conditions:  diabetes  immune system problems  infection  inflammatory bowel disease  liver disease  lung or breathing disease  lupus  nervous system problems like myasthenia gravis or Guillain-Barre syndrome  organ transplant  an unusual or allergic reaction to atezolizumab, other medicines, foods, dyes, or preservatives  pregnant or  trying to get pregnant  breast-feeding How should I use this medicine? This medicine is for infusion into a vein. It is given by a health care professional in a hospital or clinic setting. A special MedGuide will be given to you before each treatment. Be sure to read this information carefully each time. Talk to your pediatrician regarding the use of this medicine in children. Special care may be needed. Overdosage: If you think you have taken too much of this medicine contact a poison control center or emergency room at once. NOTE: This medicine is only for you. Do not share this medicine with others. What if I miss a dose? It is important not to miss your dose. Call your doctor or health care professional if you are unable to keep an appointment. What may interact with this medicine? Interactions have not been studied. This list may not describe all possible interactions. Give your health care provider a list of all the medicines, herbs, non-prescription drugs, or dietary supplements you use. Also tell them if you smoke, drink alcohol, or use illegal drugs. Some items may interact with your medicine. What should I watch for while using this medicine? Your condition will be monitored carefully while you are receiving this medicine. You may need blood work done while you are taking this medicine. Do not become pregnant while taking this medicine or for at least 5 months after stopping it. Women should inform their doctor if they wish to become pregnant or think they might be pregnant. There is a potential for serious side effects to an unborn child. Talk to your health care professional or pharmacist for more information. Do not breast-feed  an infant while taking this medicine or for at least 5 months after the last dose. What side effects may I notice from receiving this medicine? Side effects that you should report to your doctor or health care professional as soon as possible:  allergic  reactions like skin rash, itching or hives, swelling of the face, lips, or tongue  black, tarry stools  bloody or watery diarrhea  breathing problems  changes in vision  chest pain or chest tightness  chills  facial flushing  fever  headache  signs and symptoms of high blood sugar such as dizziness; dry mouth; dry skin; fruity breath; nausea; stomach pain; increased hunger or thirst; increased urination  signs and symptoms of liver injury like dark yellow or brown urine; general ill feeling or flu-like symptoms; light-colored stools; loss of appetite; nausea; right upper belly pain; unusually weak or tired; yellowing of the eyes or skin  stomach pain  trouble passing urine or change in the amount of urine Side effects that usually do not require medical attention (report to your doctor or health care professional if they continue or are bothersome):  cough  diarrhea  joint pain  muscle pain  muscle weakness  tiredness  weight loss This list may not describe all possible side effects. Call your doctor for medical advice about side effects. You may report side effects to FDA at 1-800-FDA-1088. Where should I keep my medicine? This drug is given in a hospital or clinic and will not be stored at home. NOTE: This sheet is a summary. It may not cover all possible information. If you have questions about this medicine, talk to your doctor, pharmacist, or health care provider.  2020 Elsevier/Gold Standard (2018-01-21 09:33:38)

## 2019-07-04 NOTE — Progress Notes (Signed)
Gregory Bates [phone]   Fax:([phone]) [place], MD [place] [address]  DIAGNOSIS: Extensive stage (T1 a, N2, M1a) extensive stage small cell lung cancer presented with right lung nodules in addition to mediastinal lymphadenopathy as well as pericardial metastasis diagnosed in October 2019  PRIOR THERAPY: Whole brain irradiation to multiple metastatic brain lesion diagnosed in August 2020 under the care of Dr. Sondra Come.  CURRENT THERAPY: palliative systemic chemotherapy with carboplatin for AUC of 5 on day 1, etoposide 100 mg/M2 on days 1, 2 and 3 in addition to Tecentriq (Atezolizumab) 1200 mg IV every 3 weeks.  Status post 12 cycles.  Starting from cycle #5 the patient will be treated with maintenance immunotherapy with Tecentriq 1200 mg IV every 3 weeks.  INTERVAL HISTORY: Gregory Bates 78 y.o. male returns to the clinic today for follow-up visit.  His wife Gregory Bates was available by phone during the visit.  The patient was recently diagnosed with multiple metastatic brain lesions and he underwent whole brain irradiation under the care of Dr. Sondra Come completed last week.  He is currently on a tapered dose of Decadron 4 mg p.o. 3 times daily.  He denied having any current chest pain, shortness of breath, cough or hemoptysis.  He denied having any fever or chills.  He has no nausea, vomiting, diarrhea or constipation.  He has no headache or visual changes.  He is here today for evaluation before resuming his treatment.  MEDICAL HISTORY: Past Medical History:  Diagnosis Date   Acute renal insufficiency    a. During 11/2013 adm with atrial flutter.   Anemia, unspecified    Atrial fibrillation (HCC)    CAD (coronary artery disease), native coronary artery    a. s/p cath May 2011 >> DES distal RCA;  b. 01/2013 NSTEMI >> OM1 99p (2.75x12 Promus Premier DES);  c. LHC (2/15): PCI: Promus DES to dist RCA ;   d. LHC 1/16 - dLM 110, oLAD 30, small D1 tandem 80, pOM1 stent ok, mOM 56-60, dOM bifurcation 50-60 in both vessels, AV 76-70 jailed by stent, pRCA 71 mRCA stent ok, dRCA stent ok, PLB smal 50, dPDA 60, 80; EF 35-40 >> med Rx - PCI of PDA if angina   Chronic combined systolic and diastolic CHF (congestive heart failure) ([place]) 12/23/2015   Echo 2/17: Mild LVH, EF 45-50%, inferior akinesis, grade 2 diastolic dysfunction, mild AI, mild MR, mild LAE, PASP 44 mmHg    Colorectal polyps 2002   Diabetes mellitus    AODM   Erectile dysfunction    History of echocardiogram    a. 01/2013 Echo: EF 55%, mid-dist inflat HK, Gr1 DD, Triv AI/TR, Mild MR.;  b.  Echo (1/15): Mild LVH, EF 55%, inferolateral hypokinesis, grade 1 diastolic dysfunction, mild AI, trivial MR, moderate LAE, PASP 36 mmHg    Hx of cardiovascular stress test    Stress myoview 08/07/13 with LVEF 44%, inferior scar, no ischemia   Hyperlipidemia    Hypertension    Hyponatremia    Ischemic cardiomyopathy    a. EF 35-40% by LHC in 1/16; b. Echo 2/17: Mild LVH, EF 45-50%, inferior akinesis, grade 2 diastolic dysfunction, mild AI, mild MR, mild LAE, PASP 44 mmHg   Myocardial infarction (HCC)    Oxygen deficiency    PAF (paroxysmal atrial fibrillation) ([place])    a. Dx 11/2013. Spont conv. Placed on eliquis.   Small  cell lung cancer in adult [facility]) 07/28/2018    ALLERGIES:  is allergic to brilinta [ticagrelor] and crestor [rosuvastatin].  MEDICATIONS:  Current Outpatient Medications  Medication Sig Dispense Refill   acetaminophen (TYLENOL) 325 MG tablet Take 2 tablets (650 mg total) by mouth every 6 (six) hours as needed for mild pain.     albuterol (PROVENTIL HFA;VENTOLIN HFA) 108 (90 Base) MCG/ACT inhaler Inhale 2 puffs into the lungs every 6 (six) hours as needed for wheezing or shortness of breath. 1 Inhaler 2   amiodarone (PACERONE) 200 MG tablet TAKE 1 TABLET (200 MG TOTAL) BY MOUTH DAILY. (Patient taking differently: Take  200 mg by mouth daily after breakfast. ) 90 tablet 1   apixaban (ELIQUIS) 5 MG TABS tablet Take 1 tablet (5 mg total) by mouth 2 (two) times daily. 180 tablet 2   aspirin EC 81 MG EC tablet Take 1 tablet (81 mg total) by mouth daily.     atorvastatin (LIPITOR) 20 MG tablet TAKE 1 TABLET EVERY DAY (Patient taking differently: Take 20 mg by mouth daily. ) 90 tablet 3   cholecalciferol (VITAMIN D) 1000 UNITS tablet Take 1,000 Units by mouth every evening.      dexamethasone (DECADRON) 4 MG tablet Take 1 tablet (4 mg total) by mouth 3 (three) times daily. 30 tablet 0   fexofenadine (ALLEGRA) 180 MG tablet Take 180 mg by mouth daily as needed for allergies.      fluticasone (FLONASE) 50 MCG/ACT nasal spray Place 1 spray into both nostrils daily as needed for allergies.      furosemide (LASIX) 20 MG tablet Take 1 tablet (20 mg total) by mouth daily as needed for fluid (If 3 - 5 pound fluid weight gain; take lasix and notify physician). 90 tablet 1   insulin aspart (NOVOLOG) 100 UNIT/ML injection Inject 10 Units into the skin 3 (three) times daily with meals. 10 mL 1   insulin glargine (LANTUS) 100 UNIT/ML injection Inject 0.25 mLs (25 Units total) into the skin at bedtime. 10 mL 1   Iron Combinations (IRON COMPLEX PO) Take 1 tablet by mouth at bedtime.      metFORMIN (GLUCOPHAGE) 500 MG tablet Take 500 mg by mouth 2 (two) times daily.     metoprolol tartrate (LOPRESSOR) 25 MG tablet TAKE 1 TABLET TWICE DAILY (Patient taking differently: Take 25 mg by mouth 2 (two) times daily. ) 180 tablet 2   Multiple Vitamin (MULTIVITAMIN) tablet Take 1 tablet by mouth at bedtime.      nitroGLYCERIN (NITROSTAT) 0.4 MG SL tablet Place 1 tablet (0.4 mg total) under the tongue every 5 (five) minutes as needed for chest pain. 25 tablet 2   ondansetron (ZOFRAN ODT) 4 MG disintegrating tablet Take 1 tablet (4 mg total) by mouth every 8 (eight) hours as needed for nausea or vomiting. 20 tablet 0    prochlorperazine (COMPAZINE) 10 MG tablet Take 1 tablet (10 mg total) by mouth every 6 (six) hours as needed for nausea or vomiting. 30 tablet 0   vitamin E 100 UNIT capsule Take 100 Units by mouth at bedtime.      No current facility-administered medications for this visit.     SURGICAL HISTORY:  Past Surgical History:  Procedure Laterality Date   BIOPSY  08/01/2018   Procedure: BIOPSY OF PERICARDIAL LYMPH NODE AND HILAR LYMPH NODE;  Surgeon: Grace Isaac, MD;  Location: [place];  Service: Open Heart Surgery;;   CLIPPING OF ATRIAL APPENDAGE Left 08/01/2018  Procedure: CLIPPING OF ATRIAL APPENDAGE;  Surgeon: Grace Isaac, MD;  Location: [place];  Service: Open Heart Surgery;  Laterality: Left;   COLONOSCOPY W/ POLYPECTOMY  2002   [facility]   COLONOSCOPY W/ POLYPECTOMY  2004; 2007 negative   CORONARY ANGIOPLASTY  12/26/2013   CORONARY ARTERY BYPASS GRAFT N/A 08/01/2018   Procedure: CORONARY ARTERY BYPASS GRAFTING (CABG) TIMES THREE USING LEFT INTERNAL MAMMARY ARTERY TO LAD, RIGHT GREATER SAPHENOUS VEIN GRAFT TO OM AND RCA, VEIN HARVESTED ENDOSCOPICALLY;  Surgeon: Grace Isaac, MD;  Location: [place];  Service: Open Heart Surgery;  Laterality: N/A;   CORONARY STENT PLACEMENT  2007, 2011   infantile paralysis facial asymmetry     LEFT HEART CATH AND CORONARY ANGIOGRAPHY N/A 05/18/2017   Procedure: Left Heart Cath and Coronary Angiography;  Surgeon: Martinique, Peter M, MD;  Location: [place] CV LAB;  Service: Cardiovascular;  Laterality: N/A;   LEFT HEART CATH AND CORONARY ANGIOGRAPHY N/A 07/27/2018   Procedure: LEFT HEART CATH AND CORONARY ANGIOGRAPHY;  Surgeon: Martinique, Peter M, MD;  Location: [place] CV LAB;  Service: Cardiovascular;  Laterality: N/A;   LEFT HEART CATHETERIZATION WITH CORONARY ANGIOGRAM N/A 10/02/2012   Procedure: LEFT HEART CATHETERIZATION WITH CORONARY ANGIOGRAM;  Surgeon: Peter M Martinique, MD;  Location[facility] LAB;  Service: Cardiovascular;   Laterality: N/A;   LEFT HEART CATHETERIZATION WITH CORONARY ANGIOGRAM N/A 02/20/2013   Procedure: LEFT HEART CATHETERIZATION WITH CORONARY ANGIOGRAM;  Surgeon: Burnell Blanks, MD;  Location: [facility] CATH LAB;  Service: Cardiovascular;  Laterality: N/A;   LEFT HEART CATHETERIZATION WITH CORONARY ANGIOGRAM N/A 12/26/2013   Procedure: LEFT HEART CATHETERIZATION WITH CORONARY ANGIOGRAM;  Surgeon: Burnell Blanks, MD;  Location: [facility] CATH LAB;  Service: Cardiovascular;  Laterality: N/A;   LEFT HEART CATHETERIZATION WITH CORONARY ANGIOGRAM N/A 11/28/2014   Procedure: LEFT HEART CATHETERIZATION WITH CORONARY ANGIOGRAM;  Surgeon: Burnell Blanks, MD;  Location: [facility] CATH LAB;  Service: Cardiovascular;  Laterality: N/A;   PERCUTANEOUS CORONARY STENT INTERVENTION (PCI-S)  10/02/2012   Procedure: PERCUTANEOUS CORONARY STENT INTERVENTION (PCI-S);  Surgeon: Peter M Martinique, MD;  Location: [facility] CATH LAB;  Service: Cardiovascular;;   PERCUTANEOUS CORONARY STENT INTERVENTION (PCI-S)  02/20/2013   Procedure: PERCUTANEOUS CORONARY STENT INTERVENTION (PCI-S);  Surgeon: Burnell Blanks, MD;  Location: [facility] CATH LAB;  Service: Cardiovascular;;   PERCUTANEOUS CORONARY STENT INTERVENTION (PCI-S)  12/26/2013   Procedure: PERCUTANEOUS CORONARY STENT INTERVENTION (PCI-S);  Surgeon: Burnell Blanks, MD;  Location: [facility] CATH LAB;  Service: Cardiovascular;;   TEE WITHOUT CARDIOVERSION N/A 08/01/2018   Procedure: TRANSESOPHAGEAL ECHOCARDIOGRAM (TEE);  Surgeon: Grace Isaac, MD;  Location: [place];  Service: Open Heart Surgery;  Laterality: N/A;   WISDOM TOOTH EXTRACTION      REVIEW OF SYSTEMS:  A comprehensive review of systems was negative except for: Constitutional: positive for fatigue   PHYSICAL EXAMINATION: General appearance: alert, cooperative, fatigued and no distress Head: Normocephalic, without obvious abnormality, atraumatic Neck: no adenopathy, no JVD, supple, symmetrical, trachea  midline and thyroid not enlarged, symmetric, no tenderness/mass/nodules Lymph nodes: Cervical, supraclavicular, and axillary nodes normal. Resp: rales bilaterally Back: symmetric, no curvature. ROM normal. No CVA tenderness. Cardio: regular rate and rhythm, S1, S2 normal, no murmur, click, rub or gallop GI: soft, non-tender; bowel sounds normal; no masses,  no organomegaly Extremities: extremities normal, atraumatic, no cyanosis or edema  ECOG PERFORMANCE STATUS: 1 - Symptomatic but completely ambulatory  Blood pressure (!) 109/59, pulse 67, temperature 99.8 F (37.7 C),  temperature source Oral, resp. rate 17, height 6' (1.829 m), weight 156 lb 14.4 oz (71.2 kg), SpO2 100 %.  LABORATORY DATA: Lab Results  Component Value Date   WBC 7.4 07/04/2019   HGB 11.8 (L) 07/04/2019   HCT 35.4 (L) 07/04/2019   MCV 92.7 07/04/2019   PLT 168 07/04/2019      Chemistry      Component Value Date/Time   NA 134 (L) 06/26/2019 0501   NA 126 (L) 08/24/2018 1237   K 4.3 06/26/2019 0501   CL 101 06/26/2019 0501   CO2 24 06/26/2019 0501   BUN 38 (H) 06/26/2019 0501   BUN 19 08/24/2018 1237   CREATININE 1.29 (H) 06/26/2019 0501   CREATININE 1.69 (H) 05/26/2019 0903   CREATININE 1.67 (H) 10/30/2016 1033      Component Value Date/Time   CALCIUM 8.2 (L) 06/26/2019 0501   ALKPHOS 86 06/05/2019 0546   AST 21 06/05/2019 0546   AST 16 05/26/2019 0903   ALT 20 06/05/2019 0546   ALT 21 05/26/2019 0903   BILITOT 0.5 06/05/2019 0546   BILITOT 0.7 05/26/2019 0903       RADIOGRAPHIC STUDIES: Dg Chest 2 View  Result Date: 06/25/2019 CLINICAL DATA:  Weakness, chest pain EXAM: CHEST - 2 VIEW COMPARISON:  06/04/2019 FINDINGS: Prior CABG. Heart is normal size. Lungs clear. No effusions. No acute bony abnormality. IMPRESSION: No active cardiopulmonary disease. Electronically Signed   By: Rolm Baptise M.D.   On: 06/25/2019 22:57   Ct Head Wo Contrast  Result Date: 06/04/2019 CLINICAL DATA:  Fall, history  of small cell lung cancer EXAM: CT HEAD WITHOUT CONTRAST TECHNIQUE: Contiguous axial images were obtained from the base of the skull through the vertex without intravenous contrast. COMPARISON:  Brain MRI 08/10/2018 FINDINGS: Brain: New large cavitary lesion in the high RIGHT frontal lobe with thickened wall measures 3.9 x 3.0 cm. There is extensive vasogenic edema associated with this new large lesion. Similar lesion in the LEFT frontal lobe measuring 1.9 x 1.9 cm also with vasogenic edema. Smaller midline LEFT frontal lesion measuring 1.7 cm along the anterior interhemispheric fissure. Additional lesion in the LEFT cerebellum measuring 2.0 cm Lesion in the head of the RIGHT caudate nucleus is high-density measuring 1.7 cm. Basal cisterns are patent.  No hydrocephalus. No acute intracranial hemorrhage.  No intraventricular hemorrhage Vascular: No hyperdense vessel or unexpected calcification. Skull: Normal. Negative for fracture or focal lesion. Sinuses/Orbits: No acute finding. Other: IMPRESSION: 1. Unfortunately, there are new parenchymal cavitary masses with extensive vasogenic edema consistent with multifocal intracranial metastasis with central necrosis. 2. Metastatic lesions include the high RIGHT frontal lobe, anterior LEFT frontal lobe, head of the RIGHT caudate nucleus, and LEFT cerebellum. 3. No hydrocephalus. Basal cisterns patent. No intracranial hemorrhage , Findings [place] 06/04/2019  at18:16. Electronically Signed   By: Suzy Bouchard M.D.   On: 06/04/2019 18:17   Mr Jeri Cos YY Contrast  Result Date: 06/05/2019 CLINICAL DATA:  Small cell lung cancer.  Abnormal CT head EXAM: MRI HEAD WITHOUT AND WITH CONTRAST TECHNIQUE: Multiplanar, multiecho pulse sequences of the brain and surrounding structures were obtained without and with intravenous contrast. CONTRAST:  7 mL Gadovist IV COMPARISON:  CT head 06/04/2019, MRI 08/10/2018 FINDINGS: Brain: Multiple enhancing lesions the brain  compatible with metastatic disease. These were noted on the recent CT from yesterday but were not present on the prior MRI of 08/09/2018. Extensive edema in the left frontal lobe and right parietal lobe without  midline shift. Left cerebellar rim enhancing mass with central necrosis measures 28 mm with mild surrounding edema and mild hemorrhage. 6 x 15 mm lesion right anterior temporal lobe with mild edema 19 x 12 mm enhancing lesion right head of caudate with surrounding edema. 10 mm rim enhancing lesion left thalamus with mild edema Large necrotic lesion right anterior occipital lobe measures 30 x 42 mm with extensive vasogenic edema and slight hemorrhage. 10 mm lesion right medial parietal lobe 19 mm left anterior frontal lobe 21 x 26 mm left frontal convexity lesion with extensive edema. 16 mm right lateral parietal lesion Solid enhancing lesion along the lateral sphenoid wing on the left has enlarged since the prior study and now measures 23 x 17 mm and shows susceptibility and is calcified on CT. This is consistent with meningioma which has grown in the interval. Coronal images demonstrate the this appears to be extra-axial. Vascular: Normal arterial flow voids Skull and upper cervical spine: No worrisome skeletal lesions. Sinuses/Orbits: Negative Other: None IMPRESSION: Widespread metastatic disease has developed since the prior MRI of 08/09/2018. At least 9 lesions are seen. There is a large amount of vasogenic edema in the left frontal lobe and right parietal lobe without midline shift. Solid enhancing lesion along the lateral sphenoid wing has grown in the interval and is most consistent with meningioma. Electronically Signed   By: Franchot Gallo M.D.   On: 06/05/2019 12:18   Dg Chest Port 1 View  Result Date: 06/04/2019 CLINICAL DATA:  Syncope.  History of lung carcinoma EXAM: PORTABLE CHEST 1 VIEW COMPARISON:  Chest CT May 25, 2019; chest radiograph September 16, 2019 FINDINGS: There are areas of  ill-defined opacity in the right base, left base, and left mid lung. The apparent consolidation seen in the left mid lung on recent CT is not appreciable by radiography. There is underlying emphysematous change, better delineated on recent CT. Heart size and pulmonary vascularity are normal. No adenopathy is identified by radiography. Patient is status post coronary artery bypass grafting. There is a left atrial appendage clamp present. There is aortic atherosclerosis. No bone lesions. IMPRESSION: Areas of ill-defined opacity in the lung bases and left mid lung regions. No consolidation or masses evident by radiography. Cardiac silhouette within normal limits. No adenopathy appreciable. Postoperative changes noted. Aortic Atherosclerosis (ICD10-I70.0) and Emphysema (ICD10-J43.9). Electronically Signed   By: Lowella Grip III M.D.   On: 06/04/2019 17:29    ASSESSMENT AND PLAN: This is a very pleasant 78 years old white male recently diagnosed with extensive stage small cell lung cancer.  The patient is here today for evaluation before starting the first cycle of his systemic chemotherapy with carboplatin, etoposide and Tecentriq.  Status post 12 cycles.  Starting from cycle #7 the patient has been on treatment with single agent Tecentriq. The patient has been tolerating this treatment well with no concerning adverse effects. He was recently diagnosed with multiple metastatic brain lesion status post whole brain irradiation in August 2020. His previous imaging studies showed no evidence for disease progression in the chest, abdomen and pelvis and his only progression was in the brain.   I recommended for the patient to resume his treatment with Tecentriq and will proceed with cycle #13 today. We will arrange for the patient to have repeat imaging studies after cycle #14 and if there is any evidence for disease progression systemically, I will consider the patient for second line treatment with chemotherapy  or palliative care. The patient and his  wife agreed to the current plan. I recommended for the patient to taper the dose of Decadron to 4 mg p.o. twice daily for 5 days followed by 4 mg p.o. daily for 5 days. He will come back for follow-up visit in 3 weeks for evaluation before cycle #14. He was advised to call immediately if he has any concerning symptoms in the interval. The patient voices understanding of current disease status and treatment options and is in agreement with the current care plan. All questions were answered. The patient knows to call the clinic with any problems, questions or concerns. We can certainly see the patient much sooner if necessary.  Disclaimer: This note was dictated with voice recognition software. Similar sounding words can inadvertently be transcribed and may not be corrected upon review.

## 2019-07-05 DIAGNOSIS — I5022 Chronic systolic (congestive) heart failure: Secondary | ICD-10-CM | POA: Diagnosis not present

## 2019-07-05 DIAGNOSIS — C3411 Malignant neoplasm of upper lobe, right bronchus or lung: Secondary | ICD-10-CM | POA: Diagnosis not present

## 2019-07-05 DIAGNOSIS — I252 Old myocardial infarction: Secondary | ICD-10-CM | POA: Diagnosis not present

## 2019-07-05 DIAGNOSIS — C3490 Malignant neoplasm of unspecified part of unspecified bronchus or lung: Secondary | ICD-10-CM | POA: Diagnosis not present

## 2019-07-05 DIAGNOSIS — I2510 Atherosclerotic heart disease of native coronary artery without angina pectoris: Secondary | ICD-10-CM | POA: Diagnosis not present

## 2019-07-05 DIAGNOSIS — Z9861 Coronary angioplasty status: Secondary | ICD-10-CM | POA: Diagnosis not present

## 2019-07-05 DIAGNOSIS — N183 Chronic kidney disease, stage 3 (moderate): Secondary | ICD-10-CM | POA: Diagnosis not present

## 2019-07-05 DIAGNOSIS — E1159 Type 2 diabetes mellitus with other circulatory complications: Secondary | ICD-10-CM | POA: Diagnosis not present

## 2019-07-05 DIAGNOSIS — C7931 Secondary malignant neoplasm of brain: Secondary | ICD-10-CM | POA: Diagnosis not present

## 2019-07-06 ENCOUNTER — Telehealth: Payer: Self-pay | Admitting: Internal Medicine

## 2019-07-06 DIAGNOSIS — D630 Anemia in neoplastic disease: Secondary | ICD-10-CM | POA: Diagnosis not present

## 2019-07-06 DIAGNOSIS — C7931 Secondary malignant neoplasm of brain: Secondary | ICD-10-CM | POA: Diagnosis not present

## 2019-07-06 DIAGNOSIS — M170 Bilateral primary osteoarthritis of knee: Secondary | ICD-10-CM | POA: Diagnosis not present

## 2019-07-06 DIAGNOSIS — I5042 Chronic combined systolic (congestive) and diastolic (congestive) heart failure: Secondary | ICD-10-CM | POA: Diagnosis not present

## 2019-07-06 DIAGNOSIS — C3411 Malignant neoplasm of upper lobe, right bronchus or lung: Secondary | ICD-10-CM | POA: Diagnosis not present

## 2019-07-06 DIAGNOSIS — M19071 Primary osteoarthritis, right ankle and foot: Secondary | ICD-10-CM | POA: Diagnosis not present

## 2019-07-06 DIAGNOSIS — I0981 Rheumatic heart failure: Secondary | ICD-10-CM | POA: Diagnosis not present

## 2019-07-06 DIAGNOSIS — I130 Hypertensive heart and chronic kidney disease with heart failure and stage 1 through stage 4 chronic kidney disease, or unspecified chronic kidney disease: Secondary | ICD-10-CM | POA: Diagnosis not present

## 2019-07-06 DIAGNOSIS — M19072 Primary osteoarthritis, left ankle and foot: Secondary | ICD-10-CM | POA: Diagnosis not present

## 2019-07-06 NOTE — Telephone Encounter (Signed)
Left message at preferred number re appointments. Also called home number and was able to speak with patient re next appointment for 9/22 and q3w. Patient aware 9/15 appointments cancelled. Updated schedule mailed.

## 2019-07-06 NOTE — Progress Notes (Incomplete)
°  Patient Name: Gregory Bates MRN: [id number] DOB: Sep 01, 1941 Referring Physician: Lanelle Bal (Profile Not Attached) Date of Service: 06/20/2019 [facility], [place]                                                        End Of Treatment Note  Diagnoses: C34.11-Malignant neoplasm of upper lobe, right bronchus or lung C79.31-Secondary malignant neoplasm of brain  Cancer Staging: Extensive stage (T1 a, N2, M1a)  Intent: Palliative  Radiation Treatment Dates: 06/07/2019 through 06/20/2019 Site Technique Total Dose Dose per Fx Completed Fx Beam Energies  Brain: Brain Complex 30/30 3 10/10 6X   Narrative: The patient tolerated radiation therapy relatively well. At the beginning of treatment, he reported blurry vision. He denied headaches, memory issues, vision or hearing changes, and nausea or vomiting throughout treatment.   Plan: The patient will follow-up with radiation oncology in 1 month.  ________________________________________________   Blair Promise, PhD, MD  This document serves as a record of services personally performed by [name], MD. It was created on his behalf by Wilburn Mylar, a trained medical scribe. The creation of this record is based on the scribe's personal observations and the provider's statements to them. This document has been checked and approved by the attending provider.

## 2019-07-10 ENCOUNTER — Encounter: Payer: Self-pay | Admitting: Physician Assistant

## 2019-07-10 NOTE — Progress Notes (Signed)
Cardiology Office Note    Date:  07/11/2019   ID:  Gregory Bates, DOB [birth year], MRN [id number]  PCP:  Street, Sharon Mt, MD  Cardiologist:  Lauree Chandler, MD  Electrophysiologist:  None   Chief Complaint: f/u hospitalization for syncope  History of Present Illness:   Gregory Bates is a 78 y.o. male with history of CAD s/p prior MIs and multiple stents then CABG with LA clipping in 07/2018, extensive small cell lung cancer with metastatic brain lesions on palliative chemotherapy, mild AI/MR/TR by echo 07/2018, HTN, HLD, DM, anemia, ED, paroxysmal atrial flutter and fibrillation, chronic combined CHF, CKD stage III, chronic hyponatremia who presents for post-hospital follow-up.  Gregory Bates has a medical history as above.  He also had presentation in 2019 with presentation of seizure like activity. He was placed on Keppra at that time. He also had right upper lobe collapse of the lung in the setting of recently diagnosed lung cancer, for which he is now receiving palliative chemotherapy.  Last echo 07/2018 with EF 55-60%, grade 2DD, mild AI mild MR, mild TR, mildly increased PASP. He was also admitted a few days later for volume overload and treated with diuretics. His echocardiogram at the time of his bypass in 07/2018 had shown an improvement in his EF from 45-50% to 55-60%. He was admitted early August 2020 with syncope with loss of bladder control. He is known to have metastatic brain lesions with vasogenic edema related to extensive stage small cell lung cancer. He is being given decadron per recommendation of oncology. He was found to have elevated serum creatinine and was gently hydrated for likely dehydration with improvement in creatinine. His blood pressure was low normal. Neurology was consulted with recommendation against antiepileptics at the time. MRI showed widespread brain mets, at least 9 lesions, large amount of vasogenic edema. He was seen by the cardiology team because he also  reported some chest pain. HS troponins initially only mildly elevated in flat pattern then improved, 50>54>28>12, likely related to AKI. Plavix was stopped since he was also anticoagulated with Eliquis for his atrial fib. His syncope was not felt to be cardiogenic in etiology. His Lasix was recommended as PRN only given component of dehydration. He was then readmitted 8/23 with pre-syncope felt multifactorial related to orthostatic hypotension, hyperglycemia felt due to steroids, AFib, and advanced cancer. He has guarded prognosis given advanced cancer. Last labs 07/2019 showed suppressed TSH 0.270, Na 130, Cr 1.38, K 4.6, Hgb 11.8.  He returns for f/u today with his wife in good spirits and feeling good. He denies any chest pain, SOB, orthopnea, PND, palpitations, bleeding or dizziness. BP is on the softer side but asymptomatic. He has been drinking about 5 bottles of water a day and also forgot to elevate his legs yesterday, therefore noticed his ankles swelled worse last night. He had to take a Lasix yesterday as a result. He reports his PCP stopped amiodarone but he is not sure why (I'm wondering if perhaps it was related to recent thyroid abnormality but cannot be sure).   Past Medical History:  Diagnosis Date   Acute renal insufficiency    a. During 11/2013 adm with atrial flutter.   Anemia, unspecified    Brain metastases (HCC)    CAD (coronary artery disease), native coronary artery    a. s/p prior MIs and multiple stents then CABG with LA clipping in 07/2018.   Chronic combined systolic and diastolic CHF (congestive heart failure) ([place]) 12/23/2015  Echo 2/17: Mild LVH, EF 45-50%, inferior akinesis, grade 2 diastolic dysfunction, mild AI, mild MR, mild LAE, PASP 44 mmHg    CKD (chronic kidney disease), stage III ([place])    Colorectal polyps 2002   Diabetes mellitus    AODM   Erectile dysfunction    History of echocardiogram    a. 01/2013 Echo: EF 55%, mid-dist inflat HK, Gr1 DD, Triv  AI/TR, Mild MR.;  b.  Echo (1/15): Mild LVH, EF 55%, inferolateral hypokinesis, grade 1 diastolic dysfunction, mild AI, trivial MR, moderate LAE, PASP 36 mmHg    Hx of cardiovascular stress test    Stress myoview 08/07/13 with LVEF 44%, inferior scar, no ischemia   Hyperlipidemia    Hypertension    Hyponatremia    Ischemic cardiomyopathy    a. EF 35-40% by LHC in 1/16; b. Echo 2/17: Mild LVH, EF 45-50%, inferior akinesis, grade 2 diastolic dysfunction, mild AI, mild MR, mild LAE, PASP 44 mmHg   Mild aortic insufficiency    Mild mitral regurgitation    Mild tricuspid regurgitation    Myocardial infarction (HCC)    Oxygen deficiency    PAF (paroxysmal atrial fibrillation) (HCC)    Paroxysmal atrial flutter (HCC)    Small cell lung cancer in adult ([place]) 07/28/2018   Syncope     Past Surgical History:  Procedure Laterality Date   BIOPSY  08/01/2018   Procedure: BIOPSY OF PERICARDIAL LYMPH NODE AND HILAR LYMPH NODE;  Surgeon: Grace Isaac, MD;  Location: [facility] OR;  Service: Open Heart Surgery;;   CLIPPING OF ATRIAL APPENDAGE Left 08/01/2018   Procedure: CLIPPING OF ATRIAL APPENDAGE;  Surgeon: Grace Isaac, MD;  Location: [facility];  Service: Open Heart Surgery;  Laterality: Left;   COLONOSCOPY W/ POLYPECTOMY  2002   [facility]   COLONOSCOPY W/ POLYPECTOMY  2004; 2007 negative   CORONARY ANGIOPLASTY  12/26/2013   CORONARY ARTERY BYPASS GRAFT N/A 08/01/2018   Procedure: CORONARY ARTERY BYPASS GRAFTING (CABG) TIMES THREE USING LEFT INTERNAL MAMMARY ARTERY TO LAD, RIGHT GREATER SAPHENOUS VEIN GRAFT TO OM AND RCA, VEIN HARVESTED ENDOSCOPICALLY;  Surgeon: Grace Isaac, MD;  Location: [facility];  Service: Open Heart Surgery;  Laterality: N/A;   CORONARY STENT PLACEMENT  2007, 2011   infantile paralysis facial asymmetry     LEFT HEART CATH AND CORONARY ANGIOGRAPHY N/A 05/18/2017   Procedure: Left Heart Cath and Coronary Angiography;  Surgeon: Martinique, Peter M, MD;   Location: [facility] CV LAB;  Service: Cardiovascular;  Laterality: N/A;   LEFT HEART CATH AND CORONARY ANGIOGRAPHY N/A 07/27/2018   Procedure: LEFT HEART CATH AND CORONARY ANGIOGRAPHY;  Surgeon: Martinique, Peter M, MD;  Location: [facility] LAB;  Service: Cardiovascular;  Laterality: N/A;   LEFT HEART CATHETERIZATION WITH CORONARY ANGIOGRAM N/A 10/02/2012   Procedure: LEFT HEART CATHETERIZATION WITH CORONARY ANGIOGRAM;  Surgeon: Peter M Martinique, MD;  Location: [facility] CATH LAB;  Service: Cardiovascular;  Laterality: N/A;   LEFT HEART CATHETERIZATION WITH CORONARY ANGIOGRAM N/A 02/20/2013   Procedure: LEFT HEART CATHETERIZATION WITH CORONARY ANGIOGRAM;  Surgeon: Burnell Blanks, MD;  Location: [facility] CATH LAB;  Service: Cardiovascular;  Laterality: N/A;   LEFT HEART CATHETERIZATION WITH CORONARY ANGIOGRAM N/A 12/26/2013   Procedure: LEFT HEART CATHETERIZATION WITH CORONARY ANGIOGRAM;  Surgeon: Burnell Blanks, MD;  Location: [facility] CATH LAB;  Service: Cardiovascular;  Laterality: N/A;   LEFT HEART CATHETERIZATION WITH CORONARY ANGIOGRAM N/A 11/28/2014   Procedure: LEFT HEART CATHETERIZATION WITH CORONARY ANGIOGRAM;  Surgeon:  Burnell Blanks, MD;  Location: [facility] CATH LAB;  Service: Cardiovascular;  Laterality: N/A;   PERCUTANEOUS CORONARY STENT INTERVENTION (PCI-S)  10/02/2012   Procedure: PERCUTANEOUS CORONARY STENT INTERVENTION (PCI-S);  Surgeon: Peter M Martinique, MD;  Location: [facility] CATH LAB;  Service: Cardiovascular;;   PERCUTANEOUS CORONARY STENT INTERVENTION (PCI-S)  02/20/2013   Procedure: PERCUTANEOUS CORONARY STENT INTERVENTION (PCI-S);  Surgeon: Burnell Blanks, MD;  Location: [facility] CATH LAB;  Service: Cardiovascular;;   PERCUTANEOUS CORONARY STENT INTERVENTION (PCI-S)  12/26/2013   Procedure: PERCUTANEOUS CORONARY STENT INTERVENTION (PCI-S);  Surgeon: Burnell Blanks, MD;  Location: [facility] CATH LAB;  Service: Cardiovascular;;   TEE WITHOUT CARDIOVERSION N/A 08/01/2018    Procedure: TRANSESOPHAGEAL ECHOCARDIOGRAM (TEE);  Surgeon: Grace Isaac, MD;  Location: [facility];  Service: Open Heart Surgery;  Laterality: N/A;   WISDOM TOOTH EXTRACTION      Current Medications: Current Meds  Medication Sig   acetaminophen (TYLENOL) 325 MG tablet Take 2 tablets (650 mg total) by mouth every 6 (six) hours as needed for mild pain.   apixaban (ELIQUIS) 5 MG TABS tablet Take 1 tablet (5 mg total) by mouth 2 (two) times daily.   aspirin EC 81 MG EC tablet Take 1 tablet (81 mg total) by mouth daily.   atorvastatin (LIPITOR) 20 MG tablet TAKE 1 TABLET EVERY DAY   cholecalciferol (VITAMIN D) 1000 UNITS tablet Take 1,000 Units by mouth every evening.    dexamethasone (DECADRON) 4 MG tablet Take 1 tablet (4 mg total) by mouth 3 (three) times daily.   fexofenadine (ALLEGRA) 180 MG tablet Take 180 mg by mouth daily as needed for allergies.    furosemide (LASIX) 20 MG tablet Take 1 tablet (20 mg total) by mouth daily as needed for fluid (If 3 - 5 pound fluid weight gain; take lasix and notify physician).   insulin aspart (NOVOLOG) 100 UNIT/ML injection Inject 10 Units into the skin 3 (three) times daily with meals.   insulin glargine (LANTUS) 100 UNIT/ML injection Inject 0.25 mLs (25 Units total) into the skin at bedtime.   Iron Combinations (IRON COMPLEX PO) Take 1 tablet by mouth at bedtime.    isosorbide mononitrate (IMDUR) 60 MG 24 hr tablet Take 60 mg by mouth daily.   metFORMIN (GLUCOPHAGE) 500 MG tablet Take 500 mg by mouth 2 (two) times daily.   metoprolol tartrate (LOPRESSOR) 25 MG tablet TAKE 1 TABLET TWICE DAILY   Multiple Vitamin (MULTIVITAMIN) tablet Take 1 tablet by mouth at bedtime.    nitroGLYCERIN (NITROSTAT) 0.4 MG SL tablet Place 1 tablet (0.4 mg total) under the tongue every 5 (five) minutes as needed for chest pain.   vitamin E 100 UNIT capsule Take 100 Units by mouth at bedtime.      Allergies:   Brilinta [ticagrelor] and Crestor  [rosuvastatin]   Social History   Socioeconomic History   Marital status: Married    Spouse name: Not on file   Number of children: 1   Years of education: Not on file   Highest education level: Not on file  Occupational History   Occupation: maintenance    Comment: full time  Scientist, product/process development strain: Not hard at all   Food insecurity    Worry: Never true    Inability: Never true   Transportation needs    Medical: Yes    Non-medical: Yes  Tobacco Use   Smoking status: Former Smoker    Packs/day: 2.00    Years: 30.00  Pack years: 60.00    Types: Cigarettes    Quit date: 11/02/1986    Years since quitting: 32.7   Smokeless tobacco: Never Used   Tobacco comment: smoked [place], up to 3 ppd (!)  Substance and Sexual Activity   Alcohol use: No   Drug use: No   Sexual activity: Not on file  Lifestyle   Physical activity    Days per week: Patient refused    Minutes per session: Patient refused   Stress: Not at all  Relationships   Social connections    Talks on phone: Patient refused    Gets together: Patient refused    Attends religious service: Patient refused    Active member of club or organization: Patient refused    Attends meetings of clubs or organizations: Patient refused    Relationship status: Patient refused  Other Topics Concern   Not on file  Social History Narrative   [employer] - does maintenance     Family History:  The patient's family history includes Breast cancer in his sister; Colon cancer in his brother; Heart attack (age of onset: 69) in his brother; Prostate cancer in his brother; Stroke in his sister. There is no history of Diabetes or Hypertension.  ROS:   Please see the history of present illness.  All other systems are reviewed and otherwise negative.    EKGs/Labs/Other Studies Reviewed:    Studies reviewed were summarized above.   EKG:  Reviewed hospital tracings, but no EKG ordered  today. Clinically in NSR.  Recent Labs: 06/08/2019: Magnesium 2.3 06/25/2019: B Natriuretic Peptide 924.0 07/04/2019: ALT 35; BUN 27; Creatinine 1.38; Hemoglobin 11.8; Platelet Count 168; Potassium 4.6; Sodium 130; TSH 0.270  Recent Lipid Panel    Component Value Date/Time   CHOL 170 11/04/2013 0617   TRIG 166 (H) 11/04/2013 0617   HDL 34 (L) 11/04/2013 0617   CHOLHDL 5.0 11/04/2013 0617   VLDL 33 11/04/2013 0617   LDLCALC 103 (H) 11/04/2013 0617   LDLDIRECT 69.3 11/26/2008 1256    PHYSICAL EXAM:    VS:  BP (!) 100/50    Pulse 89    Ht 6' (1.829 m)    Wt 159 lb (72.1 kg)    SpO2 93%    BMI 21.56 kg/m   BMI: Body mass index is 21.56 kg/m.  GEN: Frail appearing elderly WM in no acute distress in wheelchair HEENT: normocephalic, atraumatic Neck: no JVD, carotid bruits, or masses Cardiac: RRR; no murmurs, rubs, or gallops, trace-1+ soft puffy BLE edema, no erythema or palpable cords Respiratory:  clear to auscultation bilaterally, normal work of breathing GI: soft, nontender, nondistended, + BS MS: no deformity or atrophy Skin: warm and dry, no rash Neuro:  Alert and Oriented x 3, Strength and sensation are intact, follows commands. Mildly HOH Psych: euthymic mood, full affect  Wt Readings from Last 3 Encounters:  07/11/19 159 lb (72.1 kg)  07/04/19 156 lb 14.4 oz (71.2 kg)  07/03/19 167 lb (75.8 kg)     ASSESSMENT & PLAN:   1. CAD - Plavix stopped as above. No angina. Given tendency for softer BP, lack of angina, and the need to take PRN diuretics for swelling, will decrease Imdur to 30mg  daily (instructions explicitly given about cutting current tabs in half then filling new rx when he uses them up). Continue regimen otherwise. 2. Paroxysmal atrial fibrillation/flutter - asymptomatic. His last EKG did show AF on 06/25/19. He is in NSR on exam today.  Continue Eliquis as tolerated - dose remains appropriate. Will have our MA reach out to PCP's office for clarification on the  amiodarone.  3. Chronic combined CHF - has mild BLE edema on exam today. I asked him to scale back his fluid intake closer to 4 bottles of water daily (about 64 oz which is near 2L of max fluid daily). I suspect this will be a challenging balance given his tendency towards softer BP. Reviewed 2g sodium restriction with patient as well. I also discussed importance of elevation of extremities and compression hose as ways to manage fluid without having to take additional Lasix. We will leave this medicine PRN at this time. 4. Mild valvular disease by echo 07/2018 - no indication to repeat at present time.  Disposition: F/u with Dr. Angelena Form in 4 months.  Medication Adjustments/Labs and Tests Ordered: Current medicines are reviewed at length with the patient today.  Concerns regarding medicines are outlined above. Medication changes, Labs and Tests ordered today are summarized above and listed in the Patient Instructions accessible in Encounters.   Signed, Charlie Pitter, PA-C  07/11/2019 8:53 AM    [address], [place], [place]  [address] Phone: [phone]; Fax: [phone]

## 2019-07-11 ENCOUNTER — Ambulatory Visit: Payer: Medicare HMO

## 2019-07-11 ENCOUNTER — Telehealth: Payer: Self-pay | Admitting: Physician Assistant

## 2019-07-11 ENCOUNTER — Ambulatory Visit (INDEPENDENT_AMBULATORY_CARE_PROVIDER_SITE_OTHER): Payer: Medicare HMO | Admitting: Physician Assistant

## 2019-07-11 ENCOUNTER — Encounter: Payer: Self-pay | Admitting: Physician Assistant

## 2019-07-11 ENCOUNTER — Ambulatory Visit: Payer: Self-pay

## 2019-07-11 ENCOUNTER — Other Ambulatory Visit: Payer: Self-pay

## 2019-07-11 ENCOUNTER — Ambulatory Visit: Payer: Medicare HMO | Admitting: Physician Assistant

## 2019-07-11 ENCOUNTER — Other Ambulatory Visit: Payer: Medicare HMO

## 2019-07-11 VITALS — BP 100/50 | HR 89 | Ht 72.0 in | Wt 159.0 lb

## 2019-07-11 DIAGNOSIS — I4892 Unspecified atrial flutter: Secondary | ICD-10-CM

## 2019-07-11 DIAGNOSIS — I480 Paroxysmal atrial fibrillation: Secondary | ICD-10-CM | POA: Diagnosis not present

## 2019-07-11 DIAGNOSIS — I38 Endocarditis, valve unspecified: Secondary | ICD-10-CM

## 2019-07-11 DIAGNOSIS — I2510 Atherosclerotic heart disease of native coronary artery without angina pectoris: Secondary | ICD-10-CM | POA: Diagnosis not present

## 2019-07-11 DIAGNOSIS — I5042 Chronic combined systolic (congestive) and diastolic (congestive) heart failure: Secondary | ICD-10-CM

## 2019-07-11 DIAGNOSIS — C3411 Malignant neoplasm of upper lobe, right bronchus or lung: Secondary | ICD-10-CM | POA: Diagnosis not present

## 2019-07-11 DIAGNOSIS — E1165 Type 2 diabetes mellitus with hyperglycemia: Secondary | ICD-10-CM | POA: Diagnosis not present

## 2019-07-11 MED ORDER — ISOSORBIDE MONONITRATE ER 30 MG PO TB24: 30.0000 mg | ORAL_TABLET | Freq: Every day | ORAL | 3 refills | Status: DC

## 2019-07-11 NOTE — Telephone Encounter (Signed)
Pt came in today for OV with Melina Copa, PA, wanted samples of Eliquis. Pt's wife stated that they brought paperwork back for pt assistance for Eliquis.  I did not see any documentation about this. Pt was given another pt's assistant form and 2 boxes of Eliquis 5 mg tablet by Jeanann Lewandowsky, CMA, for pt to bring back as soon as possible. FYI

## 2019-07-11 NOTE — Telephone Encounter (Signed)
Thanks for the update. Gerald Stabs

## 2019-07-11 NOTE — Patient Instructions (Signed)
Medication Instructions:  Your physician has recommended you make the following change in your medication:  1.  REDUCE the Imdur to 30 mg.  YOU MAY CUT THE 60 MG TABLETS IN 1/2 AND USE THEM THEN GET THE NEW PRESCRIPTION AT YOUR PHARMACY   If you need a refill on your cardiac medications before your next appointment, please call your pharmacy.   Lab work: None ordered  If you have labs (blood work) drawn today and your tests are completely normal, you will receive your results only by: [name] Kitchen MyChart Message (if you have MyChart) OR . A paper copy in the mail If you have any lab test that is abnormal or we need to change your treatment, we will call you to review the results.  Testing/Procedures: None ordered  Follow-Up: At [facility], you and your health needs are our priority.  As part of our continuing mission to provide you with exceptional heart care, we have created designated Provider Care Teams.  These Care Teams include your primary Cardiologist (physician) and Advanced Practice Providers (APPs -  Physician Assistants and Nurse Practitioners) who all work together to provide you with the care you need, when you need it. You will need a follow up appointment in 4 months.  Please call our office 2 months in advance to schedule this appointment.  You may see Lauree Chandler, MD or one of the following Advanced Practice Providers on your designated Care Team:   [place], PA-C Melina Copa, PA-C . Ermalinda Barrios, PA-C  Any Other Special Instructions Will Be Listed Below (If Applicable). Try Compression Stockings and elevate your legs whenever seated, above your heart level when possible

## 2019-07-11 NOTE — Telephone Encounter (Signed)
Pt POA, [name], [place], returned my call. He states he does pt's meds and that he is taking Amiodarone 200 mg daily. He also has been made aware that pt needs to f/u with PCP in 1 week to recheck thyroid function.

## 2019-07-11 NOTE — Telephone Encounter (Signed)
   Please let pt know that we called his PCP's office who reported they did not tell him to stop amiodarone, so not sure where this information came from. He should resume at this time at prior dose of 200mg  daily. It does appear heme/onc PA drew a thyroid the other day which was abnormally suppressed but I do not see a result note on this or what action was undertaken. Patient will need f/u thyroid function in the next week or so by primary care. They are not in Epic so please call their office to make them aware of our recommendation. If thyroid function remains abnormal, may need to think about stopping amiodarone but for now will continue as he had breakthrough afib in the hospital end of August and BP is prohibitive of using other meds to control HR. Fortunately he is in NSR today. Will cc to Dr. Angelena Form to make him aware of plan as well.   Cobi Delph PA-C

## 2019-07-11 NOTE — Telephone Encounter (Signed)
Call placed to pt re: message below.  Left a message for pt to call bak.

## 2019-07-12 ENCOUNTER — Other Ambulatory Visit: Payer: Self-pay

## 2019-07-12 DIAGNOSIS — M19072 Primary osteoarthritis, left ankle and foot: Secondary | ICD-10-CM | POA: Diagnosis not present

## 2019-07-12 DIAGNOSIS — C3411 Malignant neoplasm of upper lobe, right bronchus or lung: Secondary | ICD-10-CM | POA: Diagnosis not present

## 2019-07-12 DIAGNOSIS — I130 Hypertensive heart and chronic kidney disease with heart failure and stage 1 through stage 4 chronic kidney disease, or unspecified chronic kidney disease: Secondary | ICD-10-CM | POA: Diagnosis not present

## 2019-07-12 DIAGNOSIS — C7931 Secondary malignant neoplasm of brain: Secondary | ICD-10-CM | POA: Diagnosis not present

## 2019-07-12 DIAGNOSIS — I951 Orthostatic hypotension: Secondary | ICD-10-CM | POA: Diagnosis not present

## 2019-07-12 DIAGNOSIS — M170 Bilateral primary osteoarthritis of knee: Secondary | ICD-10-CM | POA: Diagnosis not present

## 2019-07-12 DIAGNOSIS — D630 Anemia in neoplastic disease: Secondary | ICD-10-CM | POA: Diagnosis not present

## 2019-07-12 DIAGNOSIS — M19071 Primary osteoarthritis, right ankle and foot: Secondary | ICD-10-CM | POA: Diagnosis not present

## 2019-07-12 DIAGNOSIS — E1165 Type 2 diabetes mellitus with hyperglycemia: Secondary | ICD-10-CM | POA: Diagnosis not present

## 2019-07-12 NOTE — Patient Outreach (Signed)
Telephone assessment:  Placed call to patient who reports that he is doing well. Reports he has his chemo last week after talking with oncologist.   Reports no changes in weight and continues to weigh daily.  Patient reports 40 pound weight loss in a year but has gained back 10 pounds. Reports good appetite.   CBG fasting this am was 47.  Patient reports that he does not know why.  Reports he ate breakfast and CBG then 148.  Denies any symptoms of hypoglycemia.  Reports this is the first time CBG has been low. Reviewed importance of frequent monitoring and not skipping meals. Encouraged patient to call MD for continued low morning CBG levels.   Patient sounds like he is in good spirits and is getting ready to eat lunch.  PLAN: will follow up with patient in 1 week via phone.  Tomasa Rand, RN, BSN, CEN [facility] [employer] [phone].

## 2019-07-17 ENCOUNTER — Telehealth: Payer: Self-pay | Admitting: Internal Medicine

## 2019-07-17 DIAGNOSIS — E1159 Type 2 diabetes mellitus with other circulatory complications: Secondary | ICD-10-CM | POA: Diagnosis not present

## 2019-07-17 DIAGNOSIS — B370 Candidal stomatitis: Secondary | ICD-10-CM | POA: Diagnosis not present

## 2019-07-17 DIAGNOSIS — K1231 Oral mucositis (ulcerative) due to antineoplastic therapy: Secondary | ICD-10-CM | POA: Diagnosis not present

## 2019-07-17 DIAGNOSIS — B3781 Candidal esophagitis: Secondary | ICD-10-CM | POA: Diagnosis not present

## 2019-07-17 DIAGNOSIS — C3490 Malignant neoplasm of unspecified part of unspecified bronchus or lung: Secondary | ICD-10-CM | POA: Diagnosis not present

## 2019-07-17 DIAGNOSIS — Z6820 Body mass index (BMI) 20.0-20.9, adult: Secondary | ICD-10-CM | POA: Diagnosis not present

## 2019-07-17 DIAGNOSIS — C7931 Secondary malignant neoplasm of brain: Secondary | ICD-10-CM | POA: Diagnosis not present

## 2019-07-17 NOTE — Telephone Encounter (Signed)
Returned patient's phone call regarding rescheduling an appointment, left a voicemail. 

## 2019-07-18 ENCOUNTER — Other Ambulatory Visit: Payer: Medicare HMO

## 2019-07-18 ENCOUNTER — Ambulatory Visit: Payer: Medicare HMO

## 2019-07-18 ENCOUNTER — Ambulatory Visit: Payer: Medicare HMO | Admitting: Physician Assistant

## 2019-07-19 ENCOUNTER — Other Ambulatory Visit: Payer: Self-pay

## 2019-07-19 NOTE — Patient Outreach (Signed)
Telephone assessment:  Placed call to patient who answered and identified himself. Patient reports that he is doing well. Reports weight is unchanged. Denies swelling or shortness of breath. Reports CBG this am of 141.  Reports CBG are up and down due to steroids. Denies any hypoglycemic episodes since last week.   Patient reports that he has lost his hair due to Chemo.  Denies any new problems or concerns today.  PLAN: Follow up call in 1 week. Encouraged patient to continue to follow Dm and low salt diet.    Tomasa Rand, RN, BSN, CEN [facility] [employer] [phone]

## 2019-07-24 ENCOUNTER — Ambulatory Visit: Payer: Self-pay | Admitting: Radiation Oncology

## 2019-07-25 ENCOUNTER — Inpatient Hospital Stay: Payer: Medicare HMO

## 2019-07-25 ENCOUNTER — Other Ambulatory Visit: Payer: Medicare HMO

## 2019-07-25 ENCOUNTER — Other Ambulatory Visit: Payer: Self-pay

## 2019-07-25 ENCOUNTER — Telehealth: Payer: Self-pay | Admitting: Physician Assistant

## 2019-07-25 ENCOUNTER — Inpatient Hospital Stay (HOSPITAL_BASED_OUTPATIENT_CLINIC_OR_DEPARTMENT_OTHER): Payer: Medicare HMO | Admitting: Physician Assistant

## 2019-07-25 ENCOUNTER — Other Ambulatory Visit: Payer: Self-pay | Admitting: Physician Assistant

## 2019-07-25 ENCOUNTER — Encounter: Payer: Self-pay | Admitting: Physician Assistant

## 2019-07-25 VITALS — BP 96/52 | HR 61 | Temp 98.3°F | Resp 17 | Ht 73.0 in | Wt 152.9 lb

## 2019-07-25 DIAGNOSIS — R634 Abnormal weight loss: Secondary | ICD-10-CM | POA: Diagnosis not present

## 2019-07-25 DIAGNOSIS — C3490 Malignant neoplasm of unspecified part of unspecified bronchus or lung: Secondary | ICD-10-CM

## 2019-07-25 DIAGNOSIS — C7931 Secondary malignant neoplasm of brain: Secondary | ICD-10-CM | POA: Diagnosis not present

## 2019-07-25 DIAGNOSIS — E1159 Type 2 diabetes mellitus with other circulatory complications: Secondary | ICD-10-CM

## 2019-07-25 DIAGNOSIS — R7989 Other specified abnormal findings of blood chemistry: Secondary | ICD-10-CM

## 2019-07-25 DIAGNOSIS — J439 Emphysema, unspecified: Secondary | ICD-10-CM | POA: Diagnosis not present

## 2019-07-25 DIAGNOSIS — C7951 Secondary malignant neoplasm of bone: Secondary | ICD-10-CM | POA: Diagnosis not present

## 2019-07-25 DIAGNOSIS — J9611 Chronic respiratory failure with hypoxia: Secondary | ICD-10-CM | POA: Diagnosis not present

## 2019-07-25 DIAGNOSIS — Z5112 Encounter for antineoplastic immunotherapy: Secondary | ICD-10-CM

## 2019-07-25 DIAGNOSIS — I951 Orthostatic hypotension: Secondary | ICD-10-CM

## 2019-07-25 DIAGNOSIS — E119 Type 2 diabetes mellitus without complications: Secondary | ICD-10-CM | POA: Diagnosis not present

## 2019-07-25 DIAGNOSIS — I10 Essential (primary) hypertension: Secondary | ICD-10-CM | POA: Diagnosis not present

## 2019-07-25 DIAGNOSIS — E785 Hyperlipidemia, unspecified: Secondary | ICD-10-CM | POA: Diagnosis not present

## 2019-07-25 DIAGNOSIS — I252 Old myocardial infarction: Secondary | ICD-10-CM | POA: Diagnosis not present

## 2019-07-25 DIAGNOSIS — C3411 Malignant neoplasm of upper lobe, right bronchus or lung: Secondary | ICD-10-CM

## 2019-07-25 DIAGNOSIS — I480 Paroxysmal atrial fibrillation: Secondary | ICD-10-CM | POA: Diagnosis not present

## 2019-07-25 LAB — CBC WITH DIFFERENTIAL (CANCER CENTER ONLY)
Abs Immature Granulocytes: 0.24 10*3/uL — ABNORMAL HIGH (ref 0.00–0.07)
Basophils Absolute: 0 10*3/uL (ref 0.0–0.1)
Basophils Relative: 0 %
Eosinophils Absolute: 0 10*3/uL (ref 0.0–0.5)
Eosinophils Relative: 0 %
HCT: 38.9 % — ABNORMAL LOW (ref 39.0–52.0)
Hemoglobin: 12.9 g/dL — ABNORMAL LOW (ref 13.0–17.0)
Immature Granulocytes: 2 %
Lymphocytes Relative: 4 %
Lymphs Abs: 0.6 10*3/uL — ABNORMAL LOW (ref 0.7–4.0)
MCH: 30.6 pg (ref 26.0–34.0)
MCHC: 33.2 g/dL (ref 30.0–36.0)
MCV: 92.2 fL (ref 80.0–100.0)
Monocytes Absolute: 0.6 10*3/uL (ref 0.1–1.0)
Monocytes Relative: 4 %
Neutro Abs: 12.8 10*3/uL — ABNORMAL HIGH (ref 1.7–7.7)
Neutrophils Relative %: 90 %
Platelet Count: 140 10*3/uL — ABNORMAL LOW (ref 150–400)
RBC: 4.22 MIL/uL (ref 4.22–5.81)
RDW: 15.9 % — ABNORMAL HIGH (ref 11.5–15.5)
WBC Count: 14.3 10*3/uL — ABNORMAL HIGH (ref 4.0–10.5)
nRBC: 0 % (ref 0.0–0.2)

## 2019-07-25 LAB — CMP (CANCER CENTER ONLY)
ALT: 59 U/L — ABNORMAL HIGH (ref 0–44)
AST: 25 U/L (ref 15–41)
Albumin: 2.8 g/dL — ABNORMAL LOW (ref 3.5–5.0)
Alkaline Phosphatase: 175 U/L — ABNORMAL HIGH (ref 38–126)
Anion gap: 12 (ref 5–15)
BUN: 28 mg/dL — ABNORMAL HIGH (ref 8–23)
CO2: 24 mmol/L (ref 22–32)
Calcium: 8.1 mg/dL — ABNORMAL LOW (ref 8.9–10.3)
Chloride: 92 mmol/L — ABNORMAL LOW (ref 98–111)
Creatinine: 1.62 mg/dL — ABNORMAL HIGH (ref 0.61–1.24)
GFR, Est AFR Am: 46 mL/min — ABNORMAL LOW (ref >60)
GFR, Estimated: 40 mL/min — ABNORMAL LOW (ref >60)
Glucose, Bld: 555 mg/dL (ref 70–99)
Potassium: 4 mmol/L (ref 3.5–5.1)
Sodium: 128 mmol/L — ABNORMAL LOW (ref 135–145)
Total Bilirubin: 0.6 mg/dL (ref 0.3–1.2)
Total Protein: 5.8 g/dL — ABNORMAL LOW (ref 6.5–8.1)

## 2019-07-25 LAB — URINALYSIS, COMPLETE (UACMP) WITH MICROSCOPIC
Bacteria, UA: NONE SEEN
Bilirubin Urine: NEGATIVE
Glucose, UA: >=500 mg/dL — AB
Hgb urine dipstick: NEGATIVE
Ketones, ur: NEGATIVE mg/dL
Leukocytes,Ua: NEGATIVE
Nitrite: NEGATIVE
Protein, ur: NEGATIVE mg/dL
Specific Gravity, Urine: 1.016 (ref 1.005–1.030)
pH: 7 (ref 5.0–8.0)

## 2019-07-25 LAB — T4, FREE: Free T4: 1.74 ng/dL — ABNORMAL HIGH (ref 0.61–1.12)

## 2019-07-25 LAB — GLUCOSE, CAPILLARY
Glucose-Capillary: 442 mg/dL — ABNORMAL HIGH (ref 70–99)
Glucose-Capillary: 366 mg/dL — ABNORMAL HIGH (ref 70–99)

## 2019-07-25 LAB — TSH: TSH: 0.215 u[IU]/mL — ABNORMAL LOW (ref 0.320–4.118)

## 2019-07-25 MED ORDER — SODIUM CHLORIDE 0.9 % IV SOLN
Freq: Once | INTRAVENOUS | Status: DC
  Filled 2019-07-25: qty 250

## 2019-07-25 MED ORDER — SODIUM CHLORIDE 0.9 % IV SOLN
Freq: Once | INTRAVENOUS | Status: AC
  Administered 2019-07-25: 13:00:00 via INTRAVENOUS
  Filled 2019-07-25: qty 250

## 2019-07-25 MED ORDER — SODIUM CHLORIDE 0.9 % IV SOLN
1200.0000 mg | Freq: Once | INTRAVENOUS | Status: AC
  Administered 2019-07-25: 15:00:00 1200 mg via INTRAVENOUS
  Filled 2019-07-25: qty 20

## 2019-07-25 MED ORDER — INSULIN REGULAR HUMAN 100 UNIT/ML IJ SOLN
20.0000 [IU] | Freq: Once | INTRAMUSCULAR | Status: AC
  Administered 2019-07-25: 20 [IU] via SUBCUTANEOUS
  Filled 2019-07-25: qty 10

## 2019-07-25 NOTE — Progress Notes (Signed)
BS recheck at 1305 by NT Diane 442. Reported to [place] to treat with today's labs per [facility] PA and will recheck BS in 30 min.  Recheck BS 14:46 resulting 366. Okay to DC according to [facility] PA. Patient aware to follow up with PCP.

## 2019-07-25 NOTE — Progress Notes (Signed)
[facility] OFFICE PROGRESS NOTE  [address], [place], MD [place] [address]  DIAGNOSIS: Extensive stage (T1 a, N2, M1a)extensive stage small cell lung cancer presented with right lung nodules in addition to mediastinal lymphadenopathy as well as pericardial metastasis diagnosed in October 2019. He developed metastatic disease to the brain in August 2020.   PRIOR THERAPY: Whole brain irradiation to multiple metastatic brain lesion diagnosed in August 2020 under the care of Dr. Sondra Come.  CURRENT THERAPY: Palliative systemic chemotherapy with carboplatin for AUC of 5 on day 1, etoposide 100 mg/M2 on days 1, 2 and 3 in addition to Tecentriq (Atezolizumab) 1200 mg IV every 3 weeks.  Status post 13 cycles.  Starting from cycle #5 the patient will be treated with maintenance immunotherapy with Tecentriq 1200 mg IV every 3 weeks.  INTERVAL HISTORY: Gregory Bates 78 y.o. male returns to the clinic for a follow up visit. The patient recently restarted his immunotherapy treatment with tecentriq after completing whole brain radiation under the care of Dr. Sondra Come. He has a one month follow up visit with him next week. The patient is feeling well today without any concerning complaints. The patient continues to tolerate treatment with tecentriq well without any adverse side effects. Denies any fever, chills, or night sweats. He has lost weight despite drinking 1 bottle of low sugar ensure and eating "a lot". The patient recently finished his decadron taper for his recent treatment of his brain metastases and states that he is "hungry all of the time". He is unsure if he has polyuria. The patient has a history of diabetes with frequent hyperglycemia on routine labs.  He takes metformin as well as insulin.  He states that he checks his blood sugars at home. He denies nausea, vomiting, diarrhea, constipation, or abdominal pain. Denies any chest pain, shortness of breath, cough, or  hemoptysis. Denies any headache or visual changes. Denies any rashes or skin changes. The patient is here today for evaluation prior to starting cycle # 14   MEDICAL HISTORY: Past Medical History:  Diagnosis Date  . Acute renal insufficiency    a. During 11/2013 adm with atrial flutter.  . Anemia, unspecified   . Brain metastases ([place])   . CAD (coronary artery disease), native coronary artery    a. s/p prior MIs and multiple stents then CABG with LA clipping in 07/2018.  [name] Kitchen Chronic combined systolic and diastolic CHF (congestive heart failure) ([place]) 12/23/2015   Echo 2/17: Mild LVH, EF 45-50%, inferior akinesis, grade 2 diastolic dysfunction, mild AI, mild MR, mild LAE, PASP 44 mmHg   . CKD (chronic kidney disease), stage III ([place])   . Colorectal polyps 2002  . Diabetes mellitus    AODM  . Erectile dysfunction   . History of echocardiogram    a. 01/2013 Echo: EF 55%, mid-dist inflat HK, Gr1 DD, Triv AI/TR, Mild MR.;  b.  Echo (1/15): Mild LVH, EF 55%, inferolateral hypokinesis, grade 1 diastolic dysfunction, mild AI, trivial MR, moderate LAE, PASP 36 mmHg   . Hx of cardiovascular stress test    Stress myoview 08/07/13 with LVEF 44%, inferior scar, no ischemia  . Hyperlipidemia   . Hypertension   . Hyponatremia   . Ischemic cardiomyopathy    a. EF 35-40% by LHC in 1/16; b. Echo 2/17: Mild LVH, EF 45-50%, inferior akinesis, grade 2 diastolic dysfunction, mild AI, mild MR, mild LAE, PASP 44 mmHg  . Mild aortic insufficiency   .  Mild mitral regurgitation   . Mild tricuspid regurgitation   . Myocardial infarction ([place])   . Oxygen deficiency   . PAF (paroxysmal atrial fibrillation) ([place])   . Paroxysmal atrial flutter ([place])   . Small cell lung cancer in adult [facility]) 07/28/2018  . Syncope     ALLERGIES:  is allergic to brilinta [ticagrelor] and crestor [rosuvastatin].  MEDICATIONS:  Current Outpatient Medications  Medication Sig Dispense Refill  . acetaminophen (TYLENOL) 325 MG tablet  Take 2 tablets (650 mg total) by mouth every 6 (six) hours as needed for mild pain.    [name] Kitchen amiodarone (PACERONE) 200 MG tablet Take 200 mg by mouth daily.    [name] Kitchen apixaban (ELIQUIS) 5 MG TABS tablet Take 1 tablet (5 mg total) by mouth 2 (two) times daily. 180 tablet 2  . aspirin EC 81 MG EC tablet Take 1 tablet (81 mg total) by mouth daily.    [name] Kitchen atorvastatin (LIPITOR) 20 MG tablet TAKE 1 TABLET EVERY DAY 90 tablet 3  . cholecalciferol (VITAMIN D) 1000 UNITS tablet Take 1,000 Units by mouth every evening.     [name] Kitchen dexamethasone (DECADRON) 4 MG tablet Take 1 tablet (4 mg total) by mouth 3 (three) times daily. 30 tablet 0  . fexofenadine (ALLEGRA) 180 MG tablet Take 180 mg by mouth daily as needed for allergies.     . furosemide (LASIX) 20 MG tablet Take 1 tablet (20 mg total) by mouth daily as needed for fluid (If 3 - 5 pound fluid weight gain; take lasix and notify physician). 90 tablet 1  . insulin aspart (NOVOLOG) 100 UNIT/ML injection Inject 10 Units into the skin 3 (three) times daily with meals. 10 mL 1  . insulin glargine (LANTUS) 100 UNIT/ML injection Inject 0.25 mLs (25 Units total) into the skin at bedtime. 10 mL 1  . Iron Combinations (IRON COMPLEX PO) Take 1 tablet by mouth at bedtime.     . isosorbide mononitrate (IMDUR) 30 MG 24 hr tablet Take 1 tablet (30 mg total) by mouth daily. 90 tablet 3  . metFORMIN (GLUCOPHAGE) 500 MG tablet Take 500 mg by mouth 2 (two) times daily.    . metoprolol tartrate (LOPRESSOR) 25 MG tablet TAKE 1 TABLET TWICE DAILY 180 tablet 2  . Multiple Vitamin (MULTIVITAMIN) tablet Take 1 tablet by mouth at bedtime.     . nitroGLYCERIN (NITROSTAT) 0.4 MG SL tablet Place 1 tablet (0.4 mg total) under the tongue every 5 (five) minutes as needed for chest pain. 25 tablet 2  . vitamin E 100 UNIT capsule Take 100 Units by mouth at bedtime.      No current facility-administered medications for this visit.    Facility-Administered Medications Ordered in Other Visits   Medication Dose Route Frequency Provider Last Rate Last Dose  . 0.9 %  sodium chloride infusion   Intravenous Once Curt Bears, MD        SURGICAL HISTORY:  Past Surgical History:  Procedure Laterality Date  . BIOPSY  08/01/2018   Procedure: BIOPSY OF PERICARDIAL LYMPH NODE AND HILAR LYMPH NODE;  Surgeon: Grace Isaac, MD;  Location: [facility];  Service: Open Heart Surgery;;  . CLIPPING OF ATRIAL APPENDAGE Left 08/01/2018   Procedure: CLIPPING OF ATRIAL APPENDAGE;  Surgeon: Grace Isaac, MD;  Location: [facility];  Service: Open Heart Surgery;  Laterality: Left;  . COLONOSCOPY W/ POLYPECTOMY  2002   [facility]  . COLONOSCOPY W/ POLYPECTOMY  2004; 2007 negative  . CORONARY ANGIOPLASTY  12/26/2013  . CORONARY ARTERY BYPASS GRAFT N/A 08/01/2018   Procedure: CORONARY ARTERY BYPASS GRAFTING (CABG) TIMES THREE USING LEFT INTERNAL MAMMARY ARTERY TO LAD, RIGHT GREATER SAPHENOUS VEIN GRAFT TO OM AND RCA, VEIN HARVESTED ENDOSCOPICALLY;  Surgeon: Grace Isaac, MD;  Location: [facility];  Service: Open Heart Surgery;  Laterality: N/A;  . CORONARY STENT PLACEMENT  2007, 2011  . infantile paralysis facial asymmetry    . LEFT HEART CATH AND CORONARY ANGIOGRAPHY N/A 05/18/2017   Procedure: Left Heart Cath and Coronary Angiography;  Surgeon: Martinique, Peter M, MD;  Location: [facility] CV LAB;  Service: Cardiovascular;  Laterality: N/A;  . LEFT HEART CATH AND CORONARY ANGIOGRAPHY N/A 07/27/2018   Procedure: LEFT HEART CATH AND CORONARY ANGIOGRAPHY;  Surgeon: Martinique, Peter M, MD;  Location: [facility] CV [facility];  Service: Cardiovascular;  Laterality: N/A;  . LEFT HEART CATHETERIZATION WITH CORONARY ANGIOGRAM N/A 10/02/2012   Procedure: LEFT HEART CATHETERIZATION WITH CORONARY ANGIOGRAM;  Surgeon: Peter M Martinique, MD;  Location: [facility] CATH LAB;  Service: Cardiovascular;  Laterality: N/A;  . LEFT HEART CATHETERIZATION WITH CORONARY ANGIOGRAM N/A 02/20/2013   Procedure: LEFT HEART CATHETERIZATION WITH CORONARY  ANGIOGRAM;  Surgeon: Burnell Blanks, MD;  Location: [facility] CATH LAB;  Service: Cardiovascular;  Laterality: N/A;  . LEFT HEART CATHETERIZATION WITH CORONARY ANGIOGRAM N/A 12/26/2013   Procedure: LEFT HEART CATHETERIZATION WITH CORONARY ANGIOGRAM;  Surgeon: Burnell Blanks, MD;  Location: [facility] LAB;  Service: Cardiovascular;  Laterality: N/A;  . LEFT HEART CATHETERIZATION WITH CORONARY ANGIOGRAM N/A 11/28/2014   Procedure: LEFT HEART CATHETERIZATION WITH CORONARY ANGIOGRAM;  Surgeon: Burnell Blanks, MD;  Location: [facility] CATH LAB;  Service: Cardiovascular;  Laterality: N/A;  . PERCUTANEOUS CORONARY STENT INTERVENTION (PCI-S)  10/02/2012   Procedure: PERCUTANEOUS CORONARY STENT INTERVENTION (PCI-S);  Surgeon: Peter M Martinique, MD;  Location: [facility] LAB;  Service: Cardiovascular;;  . PERCUTANEOUS CORONARY STENT INTERVENTION (PCI-S)  02/20/2013   Procedure: PERCUTANEOUS CORONARY STENT INTERVENTION (PCI-S);  Surgeon: Burnell Blanks, MD;  Location: [facility] CATH LAB;  Service: Cardiovascular;;  . PERCUTANEOUS CORONARY STENT INTERVENTION (PCI-S)  12/26/2013   Procedure: PERCUTANEOUS CORONARY STENT INTERVENTION (PCI-S);  Surgeon: Burnell Blanks, MD;  Location: [facility] CATH LAB;  Service: Cardiovascular;;  . TEE WITHOUT CARDIOVERSION N/A 08/01/2018   Procedure: TRANSESOPHAGEAL ECHOCARDIOGRAM (TEE);  Surgeon: Grace Isaac, MD;  Location: [facility];  Service: Open Heart Surgery;  Laterality: N/A;  . WISDOM TOOTH EXTRACTION      REVIEW OF SYSTEMS:   Review of Systems  Constitutional: Positive for unexplained weight loss. Positive for polyphagia. Negative for chills, fatigue, and fever HENT: Negative for mouth sores, nosebleeds, sore throat and trouble swallowing.   Eyes: Negative for eye problems and icterus.  Respiratory: Negative for cough, hemoptysis, shortness of breath and wheezing.   Cardiovascular: Negative for chest pain and leg swelling.  Gastrointestinal: Negative for abdominal  pain, constipation, diarrhea, nausea and vomiting.  Genitourinary: Negative for bladder incontinence, difficulty urinating, dysuria, frequency and hematuria.   Musculoskeletal: Negative for back pain, gait problem, neck pain and neck stiffness.  Skin: Negative for itching and rash.  Neurological: Negative for dizziness, extremity weakness, gait problem, headaches, light-headedness and seizures.  Hematological: Negative for adenopathy. Does not bruise/bleed easily.  Psychiatric/Behavioral: Negative for confusion, depression and sleep disturbance. The patient is not nervous/anxious.     PHYSICAL EXAMINATION:  Blood pressure (!) 96/52, pulse 61, temperature 98.3 F (36.8 C), temperature source Temporal, resp. rate 17, height 6\' 1"  (1.854 m),  weight 152 lb 14.4 oz (69.4 kg), SpO2 100 %.  ECOG PERFORMANCE STATUS: 1 - Symptomatic but completely ambulatory  Physical Exam  Constitutional: Oriented to person, place, and time and well-developed, well-nourished, and in no distress.  HENT:  Head: Normocephalic and atraumatic.  Mouth/Throat: Oropharynx is clear and moist. No oropharyngeal exudate.  Eyes: Conjunctivae are normal. Right eye exhibits no discharge. Left eye exhibits no discharge. No scleral icterus.  Neck: Normal range of motion. Neck supple.  Cardiovascular: Normal rate, regular rhythm, normal heart sounds and intact distal pulses.   Pulmonary/Chest: Effort normal and breath sounds normal. No respiratory distress. No wheezes. No rales.  Abdominal: Soft. Bowel sounds are normal. Exhibits no distension and no mass. There is no tenderness.  Musculoskeletal: Normal range of motion. Exhibits no edema.  Lymphadenopathy:    No cervical adenopathy.  Neurological: Alert and oriented to person, place, and time. Exhibits normal muscle tone. Gait normal. Coordination normal.  Skin: Skin is warm and dry. No rash noted. Not diaphoretic. No erythema. No pallor.  Psychiatric: Mood, memory and judgment  normal.  Vitals reviewed.  LABORATORY DATA: Lab Results  Component Value Date   WBC 14.3 (H) 07/25/2019   HGB 12.9 (L) 07/25/2019   HCT 38.9 (L) 07/25/2019   MCV 92.2 07/25/2019   PLT 140 (L) 07/25/2019      Chemistry      Component Value Date/Time   NA 128 (L) 07/25/2019 1054   NA 126 (L) 08/24/2018 1237   K 4.0 07/25/2019 1054   CL 92 (L) 07/25/2019 1054   CO2 24 07/25/2019 1054   BUN 28 (H) 07/25/2019 1054   BUN 19 08/24/2018 1237   CREATININE 1.62 (H) 07/25/2019 1054   CREATININE 1.67 (H) 10/30/2016 1033      Component Value Date/Time   CALCIUM 8.1 (L) 07/25/2019 1054   ALKPHOS 175 (H) 07/25/2019 1054   AST 25 07/25/2019 1054   ALT 59 (H) 07/25/2019 1054   BILITOT 0.6 07/25/2019 1054       RADIOGRAPHIC STUDIES:  Dg Chest 2 View  Result Date: 06/25/2019 CLINICAL DATA:  Weakness, chest pain EXAM: CHEST - 2 VIEW COMPARISON:  06/04/2019 FINDINGS: Prior CABG. Heart is normal size. Lungs clear. No effusions. No acute bony abnormality. IMPRESSION: No active cardiopulmonary disease. Electronically Signed   By: Rolm Baptise M.D.   On: 06/25/2019 22:57     ASSESSMENT/PLAN:  This is a very pleasant 78 year old Caucasian male diagnosed with extensive stage small cell lung cancer.He presented with right lung nodules in addition to mediastinal lymphadenopathy as well as a pericardial metastasis. He was diagnosed in October 2019. He was found to have brain metastases in August of 2020 and is status post whole brain radiation under the care of Dr. Sondra Come.   The patient previously underwent treatment with carboplatin, etoposide, and Tecentriq. He is status post 13 cycles. Starting from cycle #7, hehas been undergoing maintenancewithTecentriq1200 mg IV every 3 weeks. He has been tolerating it well without any adverse effects.  The patient was seen with Dr. Julien Nordmann today. Labs were reviewed.  His blood sugar was found to be 555 today. He received 20 units of insulin while  in the clinic today.  Urinalysis was performed which was negative for any ketones.  Upon recheck of his blood sugar, it was found to be 366. The patient has insulin at home and he was advised to monitor his blood sugar closely at home and take his insulin as prescribed.  The patient  was educated on signs and symptoms of hyperglycemia and knows to seek emergency evaluation if he has signs and symptoms of hyperglycemia.  The patient will receive cycle #14 today as scheduled.  I will arrange for the patient had a restaging CT scan of the chest, abdomen, and pelvis prior to his next visit in 3 weeks.  The patient's scans will be performed without contrast due to his renal insufficiency.  We will see the patient back for a follow-up visit in 3 weeks for evaluation and to review his scan results before starting cycle #15.  The patient will receive 1 L of normal saline while in the clinic today for his hypotension.  The patient is interested in speaking to a nutritionist at the cancer center for further evaluation and recommendations of his weight loss.  I have placed a referral at the patient's request.  Regarding the patient's low TSH today, I will arrange for the patient to have a free T3 and free T4 be drawn for further evaluation.  Thyroid dysfunction could be a side effect of Tecentriq; however, the patient is also on amiodarone which could be contributing as well.  His thyroid studies are pending at this time.  The patient was advised to call immediately if he has any concerning symptoms in the interval. The patient voices understanding of current disease status and treatment options and is in agreement with the current care plan. All questions were answered. The patient knows to call the clinic with any problems, questions or concerns. We can certainly see the patient much sooner if necessary  Orders Placed This Encounter  Procedures  . CT Chest Wo Contrast    Standing Status:   Future     Standing Expiration Date:   07/24/2020    Order Specific Question:   ** REASON FOR EXAM (FREE TEXT)    Answer:   Restaging Lung Cancer    Order Specific Question:   Preferred imaging location?    Answer:   [facility]    Order Specific Question:   Radiology Contrast Protocol - do NOT remove file path    Answer:   \\[email or web address]  . CT Abdomen Pelvis Wo Contrast    Standing Status:   Future    Standing Expiration Date:   07/24/2020    Order Specific Question:   ** REASON FOR EXAM (FREE TEXT)    Answer:   Restaging Lung Cancer    Order Specific Question:   Preferred imaging location?    Answer:   [facility]    Order Specific Question:   Is Oral Contrast requested for this exam?    Answer:   Yes, Per Radiology protocol    Order Specific Question:   Radiology Contrast Protocol - do NOT remove file path    Answer:   \\[email or web address]  . Urinalysis, Complete w Microscopic    Standing Status:   Future    Number of Occurrences:   1    Standing Expiration Date:   07/24/2020  . T3, free    Standing Status:   Future    Number of Occurrences:   1    Standing Expiration Date:   07/24/2020  . T4, free    Standing Status:   Future    Number of Occurrences:   1    Standing Expiration Date:   07/24/2020  . Ambulatory referral to Nutrition and Diabetic E    Referral Priority:   Routine    Referral Type:  Consultation    Referral Reason:   Specialty Services Required    Number of Visits Requested:   [place], PA-C 07/25/19  ADDENDUM: Hematology/Oncology Attending: I had a face-to-face encounter with the patient today.  I recommended his care plan.  This is a very pleasant 78 years old white male with extensive stage small cell lung cancer status post induction systemic chemotherapy with carboplatin, etoposide and Tecentriq and he is currently on maintenance treatment with Tecentriq status post 13  cycles.  He has been tolerating this treatment well with no concerning complaints.  He was diagnosed with metastatic brain lesions several weeks ago status post whole brain irradiation with a tapered dose of Decadron which was completed few days ago. The patient is here today for evaluation before starting cycle #14 and his blood sugar was significantly elevated at 555.  We will give the patient a dose of regular insulin 20 units subcutaneously today and will recheck his blood sugar and he was advised to monitor it closely at home and take his treatment as prescribed by his primary care physician. We will arrange for the patient to come back for follow-up visit in 3 weeks for evaluation after repeating CT scan of the chest, abdomen and pelvis for restaging of his disease. For the hyperthyroidism, he is currently on treatment with amiodarone.  We will check a thyroid panel today. We will share the lab results with his cardiologist and will consider referring the patient to endocrinology if needed. He was advised to call immediately if he has any other concerning symptoms in the interval.  Disclaimer: This note was dictated with voice recognition software. Similar sounding words can inadvertently be transcribed and may be missed upon review. Eilleen Kempf, MD 07/25/19

## 2019-07-25 NOTE — Telephone Encounter (Signed)
No 9/22 LOS

## 2019-07-25 NOTE — Progress Notes (Signed)
CRITICAL VALUE STICKER  CRITICAL VALUE: Glucose 555  RECEIVER (on-site recipient of call): Nimsi Males-RN  DATE & TIME NOTIFIED: 07/25/19 11:45  MESSENGER (representative from lab):  PROVIDER NOTIFIED: Cassie Heilingoetter-PA  TIME OF NOTIFICATION: 11:50  RESPONSE:

## 2019-07-25 NOTE — Progress Notes (Signed)
Ok to treat with elevated glucose and SCR per Cassie, PA.  Will recheck CBG in another 30 minutes.

## 2019-07-25 NOTE — Patient Instructions (Signed)
[facility] Discharge Instructions for Patients Receiving Chemotherapy  Today you received the following chemotherapy agents: Tecentriq  To help prevent nausea and vomiting after your treatment, we encourage you to take your nausea medication as directed.   If you develop nausea and vomiting that is not controlled by your nausea medication, call the clinic.   BELOW ARE SYMPTOMS THAT SHOULD BE REPORTED IMMEDIATELY:  *FEVER GREATER THAN 100.5 F  *CHILLS WITH OR WITHOUT FEVER  NAUSEA AND VOMITING THAT IS NOT CONTROLLED WITH YOUR NAUSEA MEDICATION  *UNUSUAL SHORTNESS OF BREATH  *UNUSUAL BRUISING OR BLEEDING  TENDERNESS IN MOUTH AND THROAT WITH OR WITHOUT PRESENCE OF ULCERS  *URINARY PROBLEMS  *BOWEL PROBLEMS  UNUSUAL RASH Items with * indicate a potential emergency and should be followed up as soon as possible.  Feel free to call the clinic should you have any questions or concerns. The clinic phone number is [phone].  Please show the CHEMO ALERT CARD at check-in to the Emergency Department and triage nurse.   

## 2019-07-26 ENCOUNTER — Other Ambulatory Visit: Payer: Self-pay

## 2019-07-26 NOTE — Patient Outreach (Signed)
Telephone assessment:  Reviewed medical record from MD follow up on 07/25/2019.  Placed call to patient who answered and reports that he is doing well. Reports he fells good.  Reports he forgot to take his insulin yesterday prior to going to cancer center and that is why he thinks his CBG was high.  Reports he self monitors CBG twice a day and takes his insulin as directed.  Reports CBG today of 100.  Reports good appetite. Patient denies any changes in weight. Reports he weighs daily and has not noted a weight change.  Patient reports he self monitors BP daily and grandson records readings. Reviewed with patient to call MD for low BP or dizziness. Patient unable to verbalize BP today.  Reviewed high and low CBG and need to follow up with Primary MD for high readings.  Patient voiced understanding.  PLAN: will follow up with patient in 1 week. Reviewed pending appointments.  Tomasa Rand, RN, BSN, CEN [facility] [employer] [phone]

## 2019-07-27 ENCOUNTER — Ambulatory Visit: Payer: Medicare HMO | Admitting: Radiation Oncology

## 2019-07-27 LAB — T3, FREE: T3, Free: 1.4 pg/mL — ABNORMAL LOW (ref 2.0–4.4)

## 2019-07-31 ENCOUNTER — Ambulatory Visit: Payer: Medicare HMO | Admitting: Internal Medicine

## 2019-07-31 ENCOUNTER — Ambulatory Visit
Admission: RE | Admit: 2019-07-31 | Discharge: 2019-07-31 | Disposition: A | Discharge status: Home / Self Care | Payer: Medicare HMO | Source: Ambulatory Visit | Attending: Radiation Oncology | Admitting: Radiation Oncology

## 2019-07-31 ENCOUNTER — Other Ambulatory Visit: Payer: Medicare HMO

## 2019-07-31 ENCOUNTER — Encounter: Payer: Self-pay | Admitting: Radiation Oncology

## 2019-07-31 ENCOUNTER — Ambulatory Visit: Payer: Medicare HMO

## 2019-07-31 ENCOUNTER — Other Ambulatory Visit: Payer: Self-pay

## 2019-07-31 VITALS — BP 105/48 | HR 75 | Temp 98.5°F | Resp 18 | Ht 73.0 in | Wt 161.0 lb

## 2019-07-31 DIAGNOSIS — Z794 Long term (current) use of insulin: Secondary | ICD-10-CM | POA: Insufficient documentation

## 2019-07-31 DIAGNOSIS — Z7901 Long term (current) use of anticoagulants: Secondary | ICD-10-CM | POA: Diagnosis not present

## 2019-07-31 DIAGNOSIS — C3411 Malignant neoplasm of upper lobe, right bronchus or lung: Secondary | ICD-10-CM | POA: Insufficient documentation

## 2019-07-31 DIAGNOSIS — Z923 Personal history of irradiation: Secondary | ICD-10-CM | POA: Diagnosis not present

## 2019-07-31 DIAGNOSIS — C7931 Secondary malignant neoplasm of brain: Secondary | ICD-10-CM | POA: Insufficient documentation

## 2019-07-31 DIAGNOSIS — Z79899 Other long term (current) drug therapy: Secondary | ICD-10-CM | POA: Diagnosis not present

## 2019-07-31 DIAGNOSIS — Z7982 Long term (current) use of aspirin: Secondary | ICD-10-CM | POA: Insufficient documentation

## 2019-07-31 NOTE — Progress Notes (Signed)
Pt presents today for f/u with Dr. Sondra Come. Pt denies c/o pain. Pt denies c/o headaches. Pt denies any changes in vision since radiation, pt states vision is still poor "I just can't see". Pt denies any auditory concerns. Pt denies any memory issues. Pt denies fine motor concerns.   BP (!) 105/48 (BP Location: Left Arm, Patient Position: Sitting)   Pulse 75   Temp 98.5 F (36.9 C) (Temporal)   Resp 18   Ht 6\' 1"  (1.854 m)   Wt 161 lb (73 kg)   SpO2 100%   BMI 21.24 kg/m   Wt Readings from Last 3 Encounters:  07/31/19 161 lb (73 kg)  07/26/19 156 lb (70.8 kg)  07/25/19 152 lb 14.4 oz (69.4 kg)   Loma Sousa, RN BSN

## 2019-07-31 NOTE — Patient Instructions (Signed)
Coronavirus (COVID-19) Are you at risk?  Are you at risk for the Coronavirus (COVID-19)?  To be considered HIGH RISK for Coronavirus (COVID-19), you have to meet the following criteria:  . Traveled to [place], [place], [place], [place] or [place]; or in the [place] to [place], [place], [place], or [place]; and have fever, cough, and shortness of breath within the last 2 weeks of travel OR . Been in close contact with a person diagnosed with COVID-19 within the last 2 weeks and have fever, cough, and shortness of breath . IF YOU DO NOT MEET THESE CRITERIA, YOU ARE CONSIDERED LOW RISK FOR COVID-19.  What to do if you are HIGH RISK for COVID-19?  . If you are having a medical emergency, call 911. . Seek medical care right away. Before you go to a doctor's office, urgent care or emergency department, call ahead and tell them about your recent travel, contact with someone diagnosed with COVID-19, and your symptoms. You should receive instructions from your physician's office regarding next steps of care.  . When you arrive at healthcare provider, tell the healthcare staff immediately you have returned from visiting [place], [place], [place], [place] or [place]; or traveled in the [place] to [place], [place], [place], or [place]; in the last two weeks or you have been in close contact with a person diagnosed with COVID-19 in the last 2 weeks.   . Tell the health care staff about your symptoms: fever, cough and shortness of breath. . After you have been seen by a medical provider, you will be either: o Tested for (COVID-19) and discharged home on quarantine except to seek medical care if symptoms worsen, and asked to  - Stay home and avoid contact with others until you get your results (4-5 days)  - Avoid travel on public transportation if possible (such as bus, train, or airplane) or o Sent to the Emergency Department by EMS for evaluation, COVID-19 testing, and possible  admission depending on your condition and test results.  What to do if you are LOW RISK for COVID-19?  Reduce your risk of any infection by using the same precautions used for avoiding the common cold or flu:  . Wash your hands often with soap and warm water for at least 20 seconds.  If soap and water are not readily available, use an alcohol-based hand sanitizer with at least 60% alcohol.  . If coughing or sneezing, cover your mouth and nose by coughing or sneezing into the elbow areas of your shirt or coat, into a tissue or into your sleeve (not your hands). . Avoid shaking hands with others and consider head nods or verbal greetings only. . Avoid touching your eyes, nose, or mouth with unwashed hands.  . Avoid close contact with people who are sick. . Avoid places or events with large numbers of people in one location, like concerts or sporting events. . Carefully consider travel plans you have or are making. . If you are planning any travel outside or inside the [place], visit the CDC's Travelers' Health webpage for the latest health notices. . If you have some symptoms but not all symptoms, continue to monitor at home and seek medical attention if your symptoms worsen. . If you are having a medical emergency, call 911.   ADDITIONAL HEALTHCARE OPTIONS FOR PATIENTS  [facility] / e-Visit: [email or web address]         [facility]: [phone]  [facility]: [phone]                   [facility]: [phone]   

## 2019-07-31 NOTE — Progress Notes (Signed)
Radiation Oncology         ([phone]) [phone] ________________________________  Name: Gregory Bates MRN: [id number]  Date: 07/31/2019  DOB: 11-23-1940  Follow-Up Visit Note  CC: Street, [place], MD  Grace Isaac, MD    ICD-10-CM   1. Metastatic cancer to brain [facility])  C79.31     Diagnosis:   Extensive stage (T1 a, N2, M1a) small cell lung cancer, now with brain metastases  Interval Since Last Radiation:  6 weeks  06/07/2019 through 06/20/2019 Site Technique Total Dose Dose per Fx Completed Fx Beam Energies  Brain: Brain Complex 30/30 Gy 3 10/10 6X   Narrative:  The patient returns today for routine follow-up. He continues on maintenance immunotherapy with Tecentriq every 3 weeks under Dr. Julien Nordmann. He last say Cassandra Heilingoetter, PA on 07/25/2019.  On review of systems, he is doing well overall. He denies pain, headaches, changes in vision since radiation, auditory concerns, memory issues, and fine motor concerns. He notes his vision is poor at baseline.  ALLERGIES:  is allergic to brilinta [ticagrelor] and crestor [rosuvastatin].  Meds: Current Outpatient Medications  Medication Sig Dispense Refill  . acetaminophen (TYLENOL) 325 MG tablet Take 2 tablets (650 mg total) by mouth every 6 (six) hours as needed for mild pain.    [name] Kitchen amiodarone (PACERONE) 200 MG tablet Take 200 mg by mouth daily.    [name] Kitchen apixaban (ELIQUIS) 5 MG TABS tablet Take 1 tablet (5 mg total) by mouth 2 (two) times daily. 180 tablet 2  . aspirin EC 81 MG EC tablet Take 1 tablet (81 mg total) by mouth daily.    [name] Kitchen atorvastatin (LIPITOR) 20 MG tablet TAKE 1 TABLET EVERY DAY 90 tablet 3  . cholecalciferol (VITAMIN D) 1000 UNITS tablet Take 1,000 Units by mouth every evening.     [name] Kitchen dexamethasone (DECADRON) 4 MG tablet Take 1 tablet (4 mg total) by mouth 3 (three) times daily. 30 tablet 0  . fexofenadine (ALLEGRA) 180 MG tablet Take 180 mg by mouth daily as needed for allergies.     . furosemide (LASIX) 20 MG  tablet Take 1 tablet (20 mg total) by mouth daily as needed for fluid (If 3 - 5 pound fluid weight gain; take lasix and notify physician). 90 tablet 1  . insulin aspart (NOVOLOG) 100 UNIT/ML injection Inject 10 Units into the skin 3 (three) times daily with meals. 10 mL 1  . insulin glargine (LANTUS) 100 UNIT/ML injection Inject 0.25 mLs (25 Units total) into the skin at bedtime. 10 mL 1  . Iron Combinations (IRON COMPLEX PO) Take 1 tablet by mouth at bedtime.     . isosorbide mononitrate (IMDUR) 30 MG 24 hr tablet Take 1 tablet (30 mg total) by mouth daily. 90 tablet 3  . metFORMIN (GLUCOPHAGE) 500 MG tablet Take 500 mg by mouth 2 (two) times daily.    . metoprolol tartrate (LOPRESSOR) 25 MG tablet TAKE 1 TABLET TWICE DAILY 180 tablet 2  . Multiple Vitamin (MULTIVITAMIN) tablet Take 1 tablet by mouth at bedtime.     . nitroGLYCERIN (NITROSTAT) 0.4 MG SL tablet Place 1 tablet (0.4 mg total) under the tongue every 5 (five) minutes as needed for chest pain. 25 tablet 2  . vitamin E 100 UNIT capsule Take 100 Units by mouth at bedtime.      No current facility-administered medications for this encounter.     Physical Findings: The patient is in no acute distress. Patient is alert and oriented.  height  is 6\' 1"  (1.854 m) and weight is 161 lb (73 kg). His temporal temperature is 98.5 F (36.9 C). His blood pressure is 105/48 (abnormal) and his pulse is 75. His respiration is 18 and oxygen saturation is 100%. .  No significant changes. Lungs are clear to auscultation bilaterally. Heart has regular rate and rhythm. No palpable cervical, supraclavicular, or axillary adenopathy. Abdomen soft, non-tender, normal bowel sounds. Scalp is well-healed.  No evidence of secondary infection within the oral cavity  Lab Findings: Lab Results  Component Value Date   WBC 14.3 (H) 07/25/2019   HGB 12.9 (L) 07/25/2019   HCT 38.9 (L) 07/25/2019   MCV 92.2 07/25/2019   PLT 140 (L) 07/25/2019    Radiographic  Findings: No results found.  Impression:  The patient is doing well status post whole brain radiation therapy.  As above the patient will continue on maintenance immunotherapy with Tecentriq 1200 mg IV every 3 weeks.  Plan: MRI of the brain in 2 months and follow-up soon afterward.  ____________________________________ [name], MD   This document serves as a record of services personally performed by [name], MD. It was created on his behalf by Wilburn Mylar, a trained medical scribe. The creation of this record is based on the scribe's personal observations and the provider's statements to them. This document has been checked and approved by the attending provider.

## 2019-08-03 ENCOUNTER — Other Ambulatory Visit: Payer: Self-pay

## 2019-08-03 NOTE — Patient Outreach (Signed)
Telephone assessment:  Reviewed medical record from last radiation visit:  Placed call to patient who answered and reports that he is doing well.  Reports CBG remains elevated. Running 200-300.  Today no fasting readings.  After breakfast was 355. Wife gave him the 70/30 insulin.  Wife reports that patient gets up at night and eats cereal or oatmeal.  Wife reports her main concern today is increase in swelling to legs.  Wife reports 6 pound weight gain 3 days ago. Patient was at MD office and nothing was done.  Reviewed with wife importance of daily weights and recording of daily weights on a calendar. Wife reports patient is not eating salt.   Wife reports patient does not like to prop his legs.   PLAN: Reviewed importance of low salt, daily weights and fasting CBG. Will call back tomorrow to inquire about am readings.  Tomasa Rand, RN, BSN, CEN [facility] [employer] [phone]

## 2019-08-04 ENCOUNTER — Other Ambulatory Visit: Payer: Self-pay

## 2019-08-04 NOTE — Patient Outreach (Signed)
Telephone assessment follow up:  Placed call to patient and spoke with Mrs.Sawka.  She reports weight today of 153 pounds and decreased swelling.  Reports she recorded on calendar.  Reviewed CBG this am of 179.  Wife reports patient ate less sweets yesterday after my call.  Reviewed with wife the importance of good record keeping of readings and when to call MD. Reviewed heart failure zones.  Reviewed DM diet and its importance. Encouraged wife to make an appointment with primary MD for follow up.   PLAN:  Reviewed with wife transfer of case to Jon Billings. Verbal report given to [employer].  Next outreach call in 2 weeks.  Tomasa Rand, RN, BSN, CEN [facility] [employer] [phone]

## 2019-08-08 ENCOUNTER — Other Ambulatory Visit: Payer: Medicare HMO

## 2019-08-08 ENCOUNTER — Ambulatory Visit: Payer: Medicare HMO

## 2019-08-08 ENCOUNTER — Ambulatory Visit: Payer: Medicare HMO | Admitting: Internal Medicine

## 2019-08-09 ENCOUNTER — Other Ambulatory Visit: Payer: Self-pay | Admitting: Cardiovascular Disease

## 2019-08-09 ENCOUNTER — Telehealth: Payer: Self-pay | Admitting: *Deleted

## 2019-08-09 ENCOUNTER — Encounter: Payer: Self-pay | Admitting: Internal Medicine

## 2019-08-09 NOTE — Telephone Encounter (Signed)
Call from [place], [place], pt and family are requesting hospice services.  Call to pt spoke with wife, pt on speaker phone and verbalized  agreement to hospice. MD notified and will be the attending. Call placed to hospice with above information. Message to scheduling to cancel pt appts

## 2019-08-09 NOTE — Telephone Encounter (Signed)
"  [name] grandson [facility] [phone]) calling for information and advice.   My grandmother feels uncomfortable asking in front of him wonders if it is time to call in Hospice.  We don't know his status unable to accompany him for appointments.  Questioning him being physically able to get on the CT scanner this Friday, Oct. 9th.     Decreased appetite eating a third to a fourth of what he used to.  We do have Ensure.  Decreased weight.  Decreased mobility.  Scary to watch him get up on his own.  Gets up only to go to the bathroom.  Lying in chair asleep the majority of the day.  Previously told to use Ibuprofen for leg, feet and ankle pain.  Elevate his legs for swelling.    He no longer looks pale; skin actually looks red.     He is always cold.  No fever or shakes Temperature ranges 97.6 to 98.2 daily.  Using warm blankets, heat on and fire heater in use.  We're uncomfortably hot.  All vitals signs are normal.  Pulse = 70's,  O2 sat = 94 - 95%.  No oxygen.  At times I see him stomach breathing or mouth breathing.    Rare light cough without phlegm.  May be seasonal allergies but had a runny, yellow, nasal discharge a few days ago has cleared."  Providing call information to provider collaborative nurse.    [facility] hospital allowing one visitor/support person for procedures (scans).  Provider able to call families during F/U visit if you would like next week (08-15-2019).      Hospice reaches out to providers for orders if a patient/family requests services.

## 2019-08-11 ENCOUNTER — Ambulatory Visit (HOSPITAL_COMMUNITY): Admission: RE | Admit: 2019-08-11 | Payer: Medicare HMO | Source: Ambulatory Visit

## 2019-08-15 ENCOUNTER — Inpatient Hospital Stay: Payer: BLUE CROSS/BLUE SHIELD | Admitting: Internal Medicine

## 2019-08-15 ENCOUNTER — Inpatient Hospital Stay: Payer: BLUE CROSS/BLUE SHIELD

## 2019-08-15 ENCOUNTER — Inpatient Hospital Stay: Payer: BLUE CROSS/BLUE SHIELD | Admitting: Nutrition

## 2019-08-15 ENCOUNTER — Other Ambulatory Visit: Payer: Self-pay

## 2019-08-15 NOTE — Patient Outreach (Signed)
[facility]) Care Management  08/15/2019  Gregory Bates 1941-08-14 [id number]   Telephone call to wife Gregory Bates.  She states that patient is now with Hospice.  She states that patient has declined some and is incontinent of urine this morning. She states that the hospice nurse is all they have right now as patient has declined other services.  Advised wife that [name] will end and if she needs further services or equipment to let hospice know.  She states she is doing ok given current patient condition. She verbalized understanding of advisement and appreciative of call.    Also noted in chart that patient had requested hospice services from the MD due to condition and that patient unable to get to appointments due to condition.   Plan: RN CM will close case.  Jone Baseman, RN, MSN [facility] Care Management Coordinator Direct Line [phone] Cell [phone] Toll Free: [phone]  Fax: [phone]

## 2019-08-21 ENCOUNTER — Ambulatory Visit: Payer: Medicare HMO | Admitting: Internal Medicine

## 2019-08-21 ENCOUNTER — Other Ambulatory Visit: Payer: Medicare HMO

## 2019-08-21 ENCOUNTER — Ambulatory Visit: Payer: Medicare HMO

## 2019-08-28 ENCOUNTER — Other Ambulatory Visit: Payer: Medicare HMO

## 2019-08-28 ENCOUNTER — Ambulatory Visit: Payer: Medicare HMO | Admitting: Internal Medicine

## 2019-08-28 ENCOUNTER — Ambulatory Visit: Payer: Medicare HMO

## 2019-09-05 ENCOUNTER — Other Ambulatory Visit: Payer: Medicare HMO

## 2019-09-05 ENCOUNTER — Ambulatory Visit: Payer: Medicare HMO | Admitting: Internal Medicine

## 2019-09-05 ENCOUNTER — Ambulatory Visit: Payer: Medicare HMO

## 2019-09-13 ENCOUNTER — Other Ambulatory Visit: Payer: Self-pay

## 2019-09-13 ENCOUNTER — Emergency Department (HOSPITAL_COMMUNITY)

## 2019-09-13 ENCOUNTER — Inpatient Hospital Stay (HOSPITAL_COMMUNITY)
Admission: EM | Admit: 2019-09-13 | Discharge: 2019-09-15 | DRG: 638 | Disposition: A | Discharge status: Hospice | Attending: Internal Medicine | Admitting: Internal Medicine

## 2019-09-13 DIAGNOSIS — G40909 Epilepsy, unspecified, not intractable, without status epilepticus: Secondary | ICD-10-CM | POA: Diagnosis not present

## 2019-09-13 DIAGNOSIS — I130 Hypertensive heart and chronic kidney disease with heart failure and stage 1 through stage 4 chronic kidney disease, or unspecified chronic kidney disease: Secondary | ICD-10-CM | POA: Diagnosis present

## 2019-09-13 DIAGNOSIS — E785 Hyperlipidemia, unspecified: Secondary | ICD-10-CM | POA: Diagnosis present

## 2019-09-13 DIAGNOSIS — E11649 Type 2 diabetes mellitus with hypoglycemia without coma: Secondary | ICD-10-CM | POA: Diagnosis not present

## 2019-09-13 DIAGNOSIS — Z888 Allergy status to other drugs, medicaments and biological substances status: Secondary | ICD-10-CM

## 2019-09-13 DIAGNOSIS — Z66 Do not resuscitate: Secondary | ICD-10-CM | POA: Diagnosis present

## 2019-09-13 DIAGNOSIS — Z951 Presence of aortocoronary bypass graft: Secondary | ICD-10-CM

## 2019-09-13 DIAGNOSIS — C7931 Secondary malignant neoplasm of brain: Secondary | ICD-10-CM | POA: Diagnosis not present

## 2019-09-13 DIAGNOSIS — I5032 Chronic diastolic (congestive) heart failure: Secondary | ICD-10-CM | POA: Diagnosis present

## 2019-09-13 DIAGNOSIS — I5042 Chronic combined systolic (congestive) and diastolic (congestive) heart failure: Secondary | ICD-10-CM | POA: Diagnosis not present

## 2019-09-13 DIAGNOSIS — E1122 Type 2 diabetes mellitus with diabetic chronic kidney disease: Secondary | ICD-10-CM | POA: Diagnosis present

## 2019-09-13 DIAGNOSIS — Z515 Encounter for palliative care: Secondary | ICD-10-CM | POA: Diagnosis present

## 2019-09-13 DIAGNOSIS — E1159 Type 2 diabetes mellitus with other circulatory complications: Secondary | ICD-10-CM | POA: Diagnosis present

## 2019-09-13 DIAGNOSIS — C3490 Malignant neoplasm of unspecified part of unspecified bronchus or lung: Secondary | ICD-10-CM | POA: Diagnosis present

## 2019-09-13 DIAGNOSIS — I10 Essential (primary) hypertension: Secondary | ICD-10-CM | POA: Diagnosis not present

## 2019-09-13 DIAGNOSIS — Z20828 Contact with and (suspected) exposure to other viral communicable diseases: Secondary | ICD-10-CM | POA: Diagnosis not present

## 2019-09-13 DIAGNOSIS — R4182 Altered mental status, unspecified: Secondary | ICD-10-CM | POA: Diagnosis not present

## 2019-09-13 DIAGNOSIS — I255 Ischemic cardiomyopathy: Secondary | ICD-10-CM | POA: Diagnosis present

## 2019-09-13 DIAGNOSIS — Z7982 Long term (current) use of aspirin: Secondary | ICD-10-CM

## 2019-09-13 DIAGNOSIS — Z7902 Long term (current) use of antithrombotics/antiplatelets: Secondary | ICD-10-CM | POA: Diagnosis not present

## 2019-09-13 DIAGNOSIS — R569 Unspecified convulsions: Secondary | ICD-10-CM | POA: Diagnosis not present

## 2019-09-13 DIAGNOSIS — Z79899 Other long term (current) drug therapy: Secondary | ICD-10-CM | POA: Diagnosis not present

## 2019-09-13 DIAGNOSIS — I480 Paroxysmal atrial fibrillation: Secondary | ICD-10-CM | POA: Diagnosis present

## 2019-09-13 DIAGNOSIS — J449 Chronic obstructive pulmonary disease, unspecified: Secondary | ICD-10-CM | POA: Diagnosis present

## 2019-09-13 DIAGNOSIS — Z03818 Encounter for observation for suspected exposure to other biological agents ruled out: Secondary | ICD-10-CM | POA: Diagnosis not present

## 2019-09-13 DIAGNOSIS — I2510 Atherosclerotic heart disease of native coronary artery without angina pectoris: Secondary | ICD-10-CM | POA: Diagnosis present

## 2019-09-13 DIAGNOSIS — E1110 Type 2 diabetes mellitus with ketoacidosis without coma: Secondary | ICD-10-CM | POA: Diagnosis not present

## 2019-09-13 DIAGNOSIS — I252 Old myocardial infarction: Secondary | ICD-10-CM | POA: Diagnosis not present

## 2019-09-13 DIAGNOSIS — J9811 Atelectasis: Secondary | ICD-10-CM | POA: Diagnosis not present

## 2019-09-13 DIAGNOSIS — R739 Hyperglycemia, unspecified: Secondary | ICD-10-CM

## 2019-09-13 LAB — URINALYSIS, ROUTINE W REFLEX MICROSCOPIC
Bacteria, UA: NONE SEEN
Bilirubin Urine: NEGATIVE
Glucose, UA: >=500 mg/dL — AB
Hgb urine dipstick: NEGATIVE
Ketones, ur: NEGATIVE mg/dL
Leukocytes,Ua: NEGATIVE
Nitrite: NEGATIVE
Protein, ur: NEGATIVE mg/dL
Specific Gravity, Urine: 1.007 (ref 1.005–1.030)
pH: 6 (ref 5.0–8.0)

## 2019-09-13 LAB — COMPREHENSIVE METABOLIC PANEL
ALT: 14 U/L (ref 0–44)
AST: 26 U/L (ref 15–41)
Albumin: 2.9 g/dL — ABNORMAL LOW (ref 3.5–5.0)
Alkaline Phosphatase: 73 U/L (ref 38–126)
Anion gap: 20 — ABNORMAL HIGH (ref 5–15)
BUN: 17 mg/dL (ref 8–23)
CO2: 17 mmol/L — ABNORMAL LOW (ref 22–32)
Calcium: 7.7 mg/dL — ABNORMAL LOW (ref 8.9–10.3)
Chloride: 87 mmol/L — ABNORMAL LOW (ref 98–111)
Creatinine, Ser: 1.94 mg/dL — ABNORMAL HIGH (ref 0.61–1.24)
GFR calc Af Amer: 37 mL/min — ABNORMAL LOW (ref >60)
GFR calc non Af Amer: 32 mL/min — ABNORMAL LOW (ref >60)
Glucose, Bld: 488 mg/dL — ABNORMAL HIGH (ref 70–99)
Potassium: 3.5 mmol/L (ref 3.5–5.1)
Sodium: 124 mmol/L — ABNORMAL LOW (ref 135–145)
Total Bilirubin: 1.3 mg/dL — ABNORMAL HIGH (ref 0.3–1.2)
Total Protein: 5.6 g/dL — ABNORMAL LOW (ref 6.5–8.1)

## 2019-09-13 LAB — I-STAT CHEM 8, ED
BUN: 17 mg/dL (ref 8–23)
Calcium, Ion: 0.89 mmol/L — CL (ref 1.15–1.40)
Chloride: 88 mmol/L — ABNORMAL LOW (ref 98–111)
Creatinine, Ser: 1.7 mg/dL — ABNORMAL HIGH (ref 0.61–1.24)
Glucose, Bld: 511 mg/dL (ref 70–99)
HCT: 34 % — ABNORMAL LOW (ref 39.0–52.0)
Hemoglobin: 11.6 g/dL — ABNORMAL LOW (ref 13.0–17.0)
Potassium: 3.4 mmol/L — ABNORMAL LOW (ref 3.5–5.1)
Sodium: 125 mmol/L — ABNORMAL LOW (ref 135–145)
TCO2: 20 mmol/L — ABNORMAL LOW (ref 22–32)

## 2019-09-13 LAB — DIFFERENTIAL
Abs Immature Granulocytes: 0.07 10*3/uL (ref 0.00–0.07)
Basophils Absolute: 0.1 10*3/uL (ref 0.0–0.1)
Basophils Relative: 1 %
Eosinophils Absolute: 0.3 10*3/uL (ref 0.0–0.5)
Eosinophils Relative: 4 %
Immature Granulocytes: 1 %
Lymphocytes Relative: 11 %
Lymphs Abs: 1 10*3/uL (ref 0.7–4.0)
Monocytes Absolute: 1 10*3/uL (ref 0.1–1.0)
Monocytes Relative: 11 %
Neutro Abs: 6.6 10*3/uL (ref 1.7–7.7)
Neutrophils Relative %: 72 %

## 2019-09-13 LAB — CBC
HCT: 31.8 % — ABNORMAL LOW (ref 39.0–52.0)
Hemoglobin: 10.9 g/dL — ABNORMAL LOW (ref 13.0–17.0)
MCH: 30.4 pg (ref 26.0–34.0)
MCHC: 34.3 g/dL (ref 30.0–36.0)
MCV: 88.6 fL (ref 80.0–100.0)
Platelets: 237 10*3/uL (ref 150–400)
RBC: 3.59 MIL/uL — ABNORMAL LOW (ref 4.22–5.81)
RDW: 14.4 % (ref 11.5–15.5)
WBC: 9 10*3/uL (ref 4.0–10.5)
nRBC: 0 % (ref 0.0–0.2)

## 2019-09-13 LAB — CBG MONITORING, ED
Glucose-Capillary: 471 mg/dL — ABNORMAL HIGH (ref 70–99)
Glucose-Capillary: 499 mg/dL — ABNORMAL HIGH (ref 70–99)
Glucose-Capillary: 544 mg/dL (ref 70–99)

## 2019-09-13 LAB — PROTIME-INR
INR: 1.9 — ABNORMAL HIGH (ref 0.8–1.2)
Prothrombin Time: 21.9 seconds — ABNORMAL HIGH (ref 11.4–15.2)

## 2019-09-13 LAB — APTT: aPTT: 38 seconds — ABNORMAL HIGH (ref 24–36)

## 2019-09-13 MED ORDER — SODIUM CHLORIDE 0.9 % IV BOLUS: 500.0000 mL | Freq: Once | INTRAVENOUS | Status: DC

## 2019-09-13 MED ORDER — INSULIN REGULAR(HUMAN) IN NACL 100-0.9 UT/100ML-% IV SOLN
INTRAVENOUS | Status: DC
  Administered 2019-09-13: 4.8 [IU]/h via INTRAVENOUS
  Filled 2019-09-13: qty 100

## 2019-09-13 MED ORDER — LEVETIRACETAM IN NACL 1000 MG/100ML IV SOLN
1000.0000 mg | Freq: Once | INTRAVENOUS | Status: AC
  Administered 2019-09-13: 1000 mg via INTRAVENOUS
  Filled 2019-09-13: qty 100

## 2019-09-13 MED ORDER — SODIUM CHLORIDE 0.9 % IV BOLUS
1000.0000 mL | Freq: Once | INTRAVENOUS | Status: AC
  Administered 2019-09-13: 1000 mL via INTRAVENOUS

## 2019-09-13 MED ORDER — SODIUM CHLORIDE 0.9% FLUSH
3.0000 mL | Freq: Once | INTRAVENOUS | Status: AC
  Administered 2019-09-13: 3 mL via INTRAVENOUS

## 2019-09-13 MED ORDER — POTASSIUM CHLORIDE 10 MEQ/100ML IV SOLN
10.0000 meq | INTRAVENOUS | Status: AC
  Administered 2019-09-13 – 2019-09-14 (×2): 10 meq via INTRAVENOUS
  Filled 2019-09-13 (×2): qty 100

## 2019-09-13 MED ORDER — DEXTROSE-NACL 5-0.45 % IV SOLN: INTRAVENOUS | Status: DC

## 2019-09-13 NOTE — ED Triage Notes (Signed)
Wife called EMS for possible stroke. Hospice pt Wife says pt "acting abnormal," unresponsive, agitated. Pt alert at this time and en route. Capnography at 26, home O2 prn

## 2019-09-13 NOTE — ED Notes (Signed)
I/O cath attempted x2 with no success. Provider made aware. Pt states that he will attempt to void. If unsuccessful, will attempt a coude.

## 2019-09-13 NOTE — ED Notes (Signed)
[place] pts nephew please update as needed

## 2019-09-13 NOTE — ED Notes (Signed)
Patient transported to CT 

## 2019-09-13 NOTE — ED Provider Notes (Signed)
[facility] EMERGENCY DEPARTMENT Provider Note   CSN: [id number] Arrival date & time: 09/13/19  1916     History   Chief Complaint Chief Complaint  Patient presents with   Altered Mental Status    HPI Gregory Bates is a 78 y.o. male with a past medical history of small cell lung cancer with mets to the brain, CAD, hypertension, hyponatremia, CKD presenting to the ED for altered mental status.  66 of history provided by nephew over the phone.  States that patient had an episode that lasted several seconds just prior to arrival where he was "convulsing like he was having a seizure."  He then lost consciousness for several minutes until EMS arrived.  Unsure what caused him to regain consciousness.  States that EMS checked CBG and was over 500.  States that at baseline patient is able to answer only yes or no questions and is currently in hospice care.  Nephew unaware of any recent injuries or falls.  No history of seizure-like activity in the past.  Denies any sick contacts with similar symptoms, complaints of pain or recent changes to medications.     HPI  Past Medical History:  Diagnosis Date   Acute renal insufficiency    a. During 11/2013 adm with atrial flutter.   Anemia, unspecified    Brain metastases (HCC)    CAD (coronary artery disease), native coronary artery    a. s/p prior MIs and multiple stents then CABG with LA clipping in 07/2018.   Chronic combined systolic and diastolic CHF (congestive heart failure) ([place]) 12/23/2015   Echo 2/17: Mild LVH, EF 45-50%, inferior akinesis, grade 2 diastolic dysfunction, mild AI, mild MR, mild LAE, PASP 44 mmHg    CKD (chronic kidney disease), stage III ([place])    Colorectal polyps 2002   Diabetes mellitus    AODM   Erectile dysfunction    History of echocardiogram    a. 01/2013 Echo: EF 55%, mid-dist inflat HK, Gr1 DD, Triv AI/TR, Mild MR.;  b.  Echo (1/15): Mild LVH, EF 55%, inferolateral hypokinesis,  grade 1 diastolic dysfunction, mild AI, trivial MR, moderate LAE, PASP 36 mmHg    Hx of cardiovascular stress test    Stress myoview 08/07/13 with LVEF 44%, inferior scar, no ischemia   Hyperlipidemia    Hypertension    Hyponatremia    Ischemic cardiomyopathy    a. EF 35-40% by LHC in 1/16; b. Echo 2/17: Mild LVH, EF 45-50%, inferior akinesis, grade 2 diastolic dysfunction, mild AI, mild MR, mild LAE, PASP 44 mmHg   Mild aortic insufficiency    Mild mitral regurgitation    Mild tricuspid regurgitation    Myocardial infarction (HCC)    Oxygen deficiency    PAF (paroxysmal atrial fibrillation) (HCC)    Paroxysmal atrial flutter (HCC)    Small cell lung cancer in adult [facility]) 07/28/2018   Syncope     Patient Active Problem List   Diagnosis Date Noted   Syncope 06/26/2019   Orthostatic hypotension 06/26/2019   Metastatic small cell carcinoma to brain ([place]) 06/05/2019   Syncope and collapse 06/04/2019   Metastatic cancer to brain ([place]) 06/04/2019   COPD (chronic obstructive pulmonary disease) ([place]) 10/03/2018   Renal insufficiency 09/19/2018   Pleural effusion, right    Chronic respiratory failure ([place]) 08/13/2018   Acute respiratory failure ([place]) 08/13/2018   Leukocytosis    Extensive stage primary small cell carcinoma of lung ([place]) 08/11/2018   Encounter for antineoplastic  chemotherapy 08/11/2018   Encounter for antineoplastic immunotherapy 08/11/2018   Goals of care, counseling/discussion 08/11/2018   Acute respiratory failure with hypoxia ([place]) 08/09/2018   Seizure-like activity ([place]) 08/09/2018   S/P CABG x 3 08/01/2018   Malignant neoplasm of bronchus of right upper lobe ([place]) 07/28/2018   PAF (paroxysmal atrial fibrillation) ([place]) 07/26/2018   CKD (chronic kidney disease), stage III 07/26/2018   Abnormal CXR 07/26/2018   Normochromic normocytic anemia    Hyponatremia 10/15/2017   Nausea & vomiting 10/15/2017   Chronic diastolic CHF  (congestive heart failure) ([place]) 12/23/2015   Exertional angina ([place]) 11/21/2014   Unstable angina (HCC) 12/26/2013   Atrial flutter ([place]) 11/03/2013   CAD (coronary artery disease) 10/02/2012   Type 2 diabetes mellitus with vascular disease ([place]) 09/18/2010   Essential hypertension 03/28/2010   Normocytic anemia 12/04/2008   COLONIC POLYPS, RECURRENT 04/26/2008   HLD (hyperlipidemia) 10/07/2007   ERECTILE DYSFUNCTION 10/07/2007    Past Surgical History:  Procedure Laterality Date   BIOPSY  08/01/2018   Procedure: BIOPSY OF PERICARDIAL LYMPH NODE AND HILAR LYMPH NODE;  Surgeon: Grace Isaac, MD;  Location: [facility] OR;  Service: Open Heart Surgery;;   CLIPPING OF ATRIAL APPENDAGE Left 08/01/2018   Procedure: CLIPPING OF ATRIAL APPENDAGE;  Surgeon: Grace Isaac, MD;  Location: [facility];  Service: Open Heart Surgery;  Laterality: Left;   COLONOSCOPY W/ POLYPECTOMY  2002   [facility]   COLONOSCOPY W/ POLYPECTOMY  2004; 2007 negative   CORONARY ANGIOPLASTY  12/26/2013   CORONARY ARTERY BYPASS GRAFT N/A 08/01/2018   Procedure: CORONARY ARTERY BYPASS GRAFTING (CABG) TIMES THREE USING LEFT INTERNAL MAMMARY ARTERY TO LAD, RIGHT GREATER SAPHENOUS VEIN GRAFT TO OM AND RCA, VEIN HARVESTED ENDOSCOPICALLY;  Surgeon: Grace Isaac, MD;  Location: [place];  Service: Open Heart Surgery;  Laterality: N/A;   CORONARY STENT PLACEMENT  2007, 2011   infantile paralysis facial asymmetry     LEFT HEART CATH AND CORONARY ANGIOGRAPHY N/A 05/18/2017   Procedure: Left Heart Cath and Coronary Angiography;  Surgeon: Martinique, Peter M, MD;  Location: [facility];  Service: Cardiovascular;  Laterality: N/A;   LEFT HEART CATH AND CORONARY ANGIOGRAPHY N/A 07/27/2018   Procedure: LEFT HEART CATH AND CORONARY ANGIOGRAPHY;  Surgeon: Martinique, Peter M, MD;  Location: [facility];  Service: Cardiovascular;  Laterality: N/A;   LEFT HEART CATHETERIZATION WITH CORONARY ANGIOGRAM N/A 10/02/2012    Procedure: LEFT HEART CATHETERIZATION WITH CORONARY ANGIOGRAM;  Surgeon: Peter M Martinique, MD;  Location: [facility] CATH LAB;  Service: Cardiovascular;  Laterality: N/A;   LEFT HEART CATHETERIZATION WITH CORONARY ANGIOGRAM N/A 02/20/2013   Procedure: LEFT HEART CATHETERIZATION WITH CORONARY ANGIOGRAM;  Surgeon: Burnell Blanks, MD;  Location: [facility] CATH LAB;  Service: Cardiovascular;  Laterality: N/A;   LEFT HEART CATHETERIZATION WITH CORONARY ANGIOGRAM N/A 12/26/2013   Procedure: LEFT HEART CATHETERIZATION WITH CORONARY ANGIOGRAM;  Surgeon: Burnell Blanks, MD;  Location: [facility] CATH LAB;  Service: Cardiovascular;  Laterality: N/A;   LEFT HEART CATHETERIZATION WITH CORONARY ANGIOGRAM N/A 11/28/2014   Procedure: LEFT HEART CATHETERIZATION WITH CORONARY ANGIOGRAM;  Surgeon: Burnell Blanks, MD;  Location: [facility] CATH LAB;  Service: Cardiovascular;  Laterality: N/A;   PERCUTANEOUS CORONARY STENT INTERVENTION (PCI-S)  10/02/2012   Procedure: PERCUTANEOUS CORONARY STENT INTERVENTION (PCI-S);  Surgeon: Peter M Martinique, MD;  Location: [facility] CATH LAB;  Service: Cardiovascular;;   PERCUTANEOUS CORONARY STENT INTERVENTION (PCI-S)  02/20/2013   Procedure: PERCUTANEOUS CORONARY STENT INTERVENTION (PCI-S);  Surgeon: Burnell Blanks, MD;  Location: [facility] CATH LAB;  Service: Cardiovascular;;   PERCUTANEOUS CORONARY STENT INTERVENTION (PCI-S)  12/26/2013   Procedure: PERCUTANEOUS CORONARY STENT INTERVENTION (PCI-S);  Surgeon: Burnell Blanks, MD;  Location: [facility] CATH LAB;  Service: Cardiovascular;;   TEE WITHOUT CARDIOVERSION N/A 08/01/2018   Procedure: TRANSESOPHAGEAL ECHOCARDIOGRAM (TEE);  Surgeon: Grace Isaac, MD;  Location: [facility];  Service: Open Heart Surgery;  Laterality: N/A;   WISDOM TOOTH EXTRACTION          Home Medications    Prior to Admission medications   Medication Sig Start Date End Date Taking? Authorizing Provider  acetaminophen (TYLENOL) 325 MG tablet Take 2 tablets (650 mg  total) by mouth every 6 (six) hours as needed for mild pain. 08/06/18   Nani Skillern, PA-C  amiodarone (PACERONE) 200 MG tablet TAKE 1 TABLET (200 MG TOTAL) BY MOUTH DAILY. 08/09/19   Burnell Blanks, MD  apixaban (ELIQUIS) 5 MG TABS tablet Take 1 tablet (5 mg total) by mouth 2 (two) times daily. 01/18/19   Burnell Blanks, MD  aspirin EC 81 MG EC tablet Take 1 tablet (81 mg total) by mouth daily. 08/06/18   Lars Pinks M, PA-C  atorvastatin (LIPITOR) 20 MG tablet TAKE 1 TABLET EVERY DAY 10/31/18   Burnell Blanks, MD  cholecalciferol (VITAMIN D) 1000 UNITS tablet Take 1,000 Units by mouth every evening.     [provider]  clopidogrel (PLAVIX) 75 MG tablet TAKE 1 TABLET EVERY DAY WITH BREAKFAST 08/09/19   Burnell Blanks, MD  dexamethasone (DECADRON) 4 MG tablet Take 1 tablet (4 mg total) by mouth 3 (three) times daily. 06/28/19   Donne Hazel, MD  fexofenadine (ALLEGRA) 180 MG tablet Take 180 mg by mouth daily as needed for allergies.     [provider]  furosemide (LASIX) 20 MG tablet TAKE 1 TABLET (20 MG TOTAL) BY MOUTH DAILY. 08/09/19   Burnell Blanks, MD  insulin aspart (NOVOLOG) 100 UNIT/ML injection Inject 10 Units into the skin 3 (three) times daily with meals. 06/08/19 08/07/19  Cherylann Ratel A, DO  insulin glargine (LANTUS) 100 UNIT/ML injection Inject 0.25 mLs (25 Units total) into the skin at bedtime. 06/08/19 08/07/19  Cherylann Ratel A, DO  Iron Combinations (IRON COMPLEX PO) Take 1 tablet by mouth at bedtime.     [provider]  isosorbide mononitrate (IMDUR) 30 MG 24 hr tablet Take 1 tablet (30 mg total) by mouth daily. 07/11/19 10/09/19  Dunn, Nedra Hai, PA-C  metFORMIN (GLUCOPHAGE) 500 MG tablet Take 500 mg by mouth 2 (two) times daily. 06/15/19   [provider]  metoprolol tartrate (LOPRESSOR) 25 MG tablet TAKE 1 TABLET TWICE DAILY 12/21/18   Burnell Blanks, MD  Multiple Vitamin (MULTIVITAMIN)  tablet Take 1 tablet by mouth at bedtime.     [provider]  nitroGLYCERIN (NITROSTAT) 0.4 MG SL tablet Place 1 tablet (0.4 mg total) under the tongue every 5 (five) minutes as needed for chest pain. 05/12/19 08/10/19  Burnell Blanks, MD  vitamin E 100 UNIT capsule Take 100 Units by mouth at bedtime.     [provider]    Family History Family History  Problem Relation Age of Onset   Heart attack Brother 56   Colon cancer Brother    Prostate cancer Brother    Breast cancer Sister    Stroke Sister    Diabetes Neg Hx    Hypertension Neg  Hx     Social History Social History   Tobacco Use   Smoking status: Former Smoker    Packs/day: 2.00    Years: 30.00    Pack years: 60.00    Types: Cigarettes    Quit date: 11/02/1986    Years since quitting: 32.8   Smokeless tobacco: Never Used   Tobacco comment: smoked [place], up to 3 ppd (!)  Substance Use Topics   Alcohol use: No   Drug use: No     Allergies   Brilinta [ticagrelor] and Crestor [rosuvastatin]   Review of Systems Review of Systems  Unable to perform ROS: Mental status change     Physical Exam Updated Vital Signs BP (!) 104/45 (BP Location: Left Arm)    Pulse 67    Temp 98.5 F (36.9 C) (Oral)    Resp 18    Ht 5\' 11"  (1.803 m)    Wt 68 kg    SpO2 97%    BMI 20.92 kg/m   Physical Exam Vitals signs and nursing note reviewed.  Constitutional:      General: He is not in acute distress.    Appearance: He is well-developed.  HENT:     Head: Normocephalic and atraumatic.     Nose: Nose normal.  Eyes:     General: No scleral icterus.       Left eye: No discharge.     Conjunctiva/sclera: Conjunctivae normal.  Neck:     Musculoskeletal: Normal range of motion and neck supple.  Cardiovascular:     Rate and Rhythm: Normal rate and regular rhythm.     Heart sounds: Normal heart sounds. No murmur. No friction rub. No gallop.   Pulmonary:     Effort: Pulmonary effort is  normal. No respiratory distress.     Breath sounds: Normal breath sounds.  Abdominal:     General: Bowel sounds are normal. There is no distension.     Palpations: Abdomen is soft.     Tenderness: There is no abdominal tenderness. There is no guarding.  Musculoskeletal: Normal range of motion.  Skin:    General: Skin is warm and dry.     Findings: No rash.  Neurological:     General: No focal deficit present.     Mental Status: He is alert and oriented to person, place, and time.     Cranial Nerves: No cranial nerve deficit.     Sensory: No sensory deficit.     Motor: No weakness or abnormal muscle tone.     Coordination: Coordination normal.     Comments: Alert, oriented to self, place, time and situation.  No facial asymmetry noted. Equal grip strength bilaterally. Strength 5/5 in BLE, BUE.      ED Treatments / Results  Labs (all labs ordered are listed, but only abnormal results are displayed) Labs Reviewed  PROTIME-INR - Abnormal; Notable for the following components:      Result Value   Prothrombin Time 21.9 (*)    INR 1.9 (*)    All other components within normal limits  APTT - Abnormal; Notable for the following components:   aPTT 38 (*)    All other components within normal limits  CBC - Abnormal; Notable for the following components:   RBC 3.59 (*)    Hemoglobin 10.9 (*)    HCT 31.8 (*)    All other components within normal limits  COMPREHENSIVE METABOLIC PANEL - Abnormal; Notable for the following components:  Sodium 124 (*)    Chloride 87 (*)    CO2 17 (*)    Glucose, Bld 488 (*)    Creatinine, Ser 1.94 (*)    Calcium 7.7 (*)    Total Protein 5.6 (*)    Albumin 2.9 (*)    Total Bilirubin 1.3 (*)    GFR calc non Af Amer 32 (*)    GFR calc Af Amer 37 (*)    Anion gap 20 (*)    All other components within normal limits  I-STAT CHEM 8, ED - Abnormal; Notable for the following components:   Sodium 125 (*)    Potassium 3.4 (*)    Chloride 88 (*)     Creatinine, Ser 1.70 (*)    Glucose, Bld 511 (*)    Calcium, Ion 0.89 (*)    TCO2 20 (*)    Hemoglobin 11.6 (*)    HCT 34.0 (*)    All other components within normal limits  CBG MONITORING, ED - Abnormal; Notable for the following components:   Glucose-Capillary 471 (*)    All other components within normal limits  CBG MONITORING, ED - Abnormal; Notable for the following components:   Glucose-Capillary 544 (*)    All other components within normal limits  URINE CULTURE  SARS CORONAVIRUS 2 (TAT 6-24 HRS)  DIFFERENTIAL  URINALYSIS, ROUTINE W REFLEX MICROSCOPIC  CBG MONITORING, ED    EKG None  Radiology Dg Chest 2 View  Result Date: 09/13/2019 CLINICAL DATA:  Altered mental status. EXAM: CHEST - 2 VIEW COMPARISON:  06/25/2019 FINDINGS: Heart size and pulmonary vascularity are normal. No infiltrates or effusions. Tiny area of atelectasis at the left base laterally. CABG. Clip on the left atrial appendage. Aortic atherosclerosis. No significant bone abnormality. IMPRESSION: 1. No significant acute abnormalities. 2. Minimal atelectasis at the left base. 3. Aortic atherosclerosis. Electronically Signed   By: Lorriane Shire M.D.   On: 09/13/2019 20:45   Ct Head Wo Contrast  Result Date: 09/13/2019 CLINICAL DATA:  Altered mental status EXAM: CT HEAD WITHOUT CONTRAST TECHNIQUE: Contiguous axial images were obtained from the base of the skull through the vertex without intravenous contrast. COMPARISON:  06/04/2019, MRI from 06/05/2019 FINDINGS: Brain: There again noted multiple intracranial metastases particularly within the left frontal lobe, left cerebellar hemisphere and right parietal lobe near the vertex these are slightly less prominent than that seen on the prior exam with decreased overall vasogenic edema. Some of the previously seen metastatic lesions on MRI are not well appreciated on today's exam. This is consistent with the interval radiation therapy. Left sphenoid wing meningioma is  noted and stable in appearance. No new lesions are identified. No findings to suggest acute hemorrhage or acute infarction are seen. Chronic atrophic and ischemic changes are again noted. Vascular: No hyperdense vessel or unexpected calcification. Skull: Normal. Negative for fracture or focal lesion. Sinuses/Orbits: Mucosal thickening is noted within the maxillary antra bilaterally. Soft tissue irregularity in the nasal passages is noted which was not appreciated on the prior exam due to the imaging technique. The septum is less well visualized. These changes are likely related to some septal necrosis from previous radiation therapy. Other: Increased fluid is noted within the mastoid air cells on the left. IMPRESSION: Persistent but improved appearance of diffuse intracranial metastatic disease. No acute hemorrhage is seen. Stable left sphenoid wing meningioma. Chronic atrophic and ischemic changes stable from the prior exam. Irregularity in the nasal passages is noted. The septum appears less prominent with  surrounding soft tissue density seen. This is new from previous exams and likely related to prior radiation therapy. Direct visualization may be helpful for further evaluation. Electronically Signed   By: Inez Catalina M.D.   On: 09/13/2019 20:11    Procedures .Critical Care Performed by: Delia Heady, PA-C Authorized by: Delia Heady, PA-C   Critical care provider statement:    Critical care time (minutes):  35   Critical care time was exclusive of:  Separately billable procedures and treating other patients   Critical care was necessary to treat or prevent imminent or life-threatening deterioration of the following conditions:  Cardiac failure, renal failure, respiratory failure, endocrine crisis and metabolic crisis   Critical care was time spent personally by me on the following activities:  Development of treatment plan with patient or surrogate, discussions with consultants, evaluation of  patient's response to treatment, examination of patient, obtaining history from patient or surrogate, ordering and performing treatments and interventions, ordering and review of laboratory studies, ordering and review of radiographic studies, re-evaluation of patient's condition and review of old charts   I assumed direction of critical care for this patient from another provider in my specialty: no     (including critical care time)  Medications Ordered in ED Medications  insulin regular, human (MYXREDLIN) 100 units/ 100 mL infusion (has no administration in time range)  potassium chloride 10 mEq in 100 mL IVPB (has no administration in time range)  dextrose 5 %-0.45 % sodium chloride infusion (has no administration in time range)  sodium chloride flush (NS) 0.9 % injection 3 mL (3 mLs Intravenous Given 09/13/19 2120)  levETIRAcetam (KEPPRA) IVPB 1000 mg/100 mL premix (1,000 mg Intravenous New Bag/Given 09/13/19 2116)  sodium chloride 0.9 % bolus 1,000 mL (1,000 mLs Intravenous New Bag/Given 09/13/19 2115)     Initial Impression / Assessment and Plan / ED Course  I have reviewed the triage vital signs and the nursing notes.  Pertinent labs & imaging results that were available during my care of the patient were reviewed by me and considered in my medical decision making (see chart for details).  Clinical Course as of Sep 13 2251  Wed Sep 13, 2019  2113 Spoke to Dr. Leonel Ramsay, neurologist.  Recommends that patient be placed on Keppra 250 twice daily after 1 g of IV loading dose here.  He also needs to be referred to a neurologist.   [HK]  2239 Glucose(!!): 511 [HK]  2239 Sodium(!): 124 [HK]  2239 Anion gap(!): 20 [HK]  2239 CO2(!): 17 [HK]    Clinical Course User Index [HK] Delia Heady, PA-C       78 year old male with a past medical history of lung cancer with mets to the brain, diabetes, CAD, hypertension presenting to the ED with altered mental status.  9 of history  is provided by grandson over the phone.  Patient had a seizure-like episode the last several minutes followed by syncope.  No history of similar symptoms in the past.  Yolanda Bonine states that patient has had decreased appetite although they are still giving him his insulin.  Denies any injuries or falls.  On exam patient is alert and oriented x3.  He is not complaining of any chest pain, headache, abdominal pain.  Lab work significant for hyperglycemia 511, anion gap of 20, evidence of acidosis.  CT of the head with persistent but improved mets.  Spoke to Dr. Leonel Ramsay who recommends Keppra and referral to neurology.  Patient started on insulin drip  and IV fluids for this acidosis.  Grandson updated on plan.  Patient is currently on hospice as of several days ago with [facility] [place].  Spoke to case manager Mariann Laster who will and form hospice tomorrow of patient's admission.  Covid test is pending.  Will admit to hospitalist Dr. Linda Hedges.  Final Clinical Impressions(s) / ED Diagnoses   Final diagnoses:  Seizure-like activity ([place])  Hyperglycemia    ED Discharge Orders    None      Portions of this note were generated with Dragon dictation software. Dictation errors may occur despite best attempts at proofreading.    Delia Heady, PA-C 09/13/19 2256    Charlesetta Shanks, MD 09/13/19 2303

## 2019-09-13 NOTE — Discharge Instructions (Addendum)
Managing Non-Epileptic Seizures, Adult Epileptic seizures are caused by abnormal electrical activity in the brain, but not all seizures are caused by epilepsy. Seizures that are not caused by epilepsy are called non-epileptic seizures, and there are two types:  Physiologic non-epileptic seizure. This is also called provoked seizure or organic seizure. This type of seizure stops when the cause goes away or is treated. Possible causes include: ? High fever. ? High or low blood sugar (glucose). ? Brain injury. ? Brain infection.  Psychogenic non-epileptic seizure (PNES). This can be caused by a mental disturbance (psychological distress), not by abnormal brain activity or brain injury. This may look similar to other types of seizures. A PNES may be called an "event" or "attack" instead of a seizure. Possible causes include: ? Stress. ? Major life events, such as divorce or death of a loved one. ? Post-traumatic stress disorder (PTSD). ? Physical or sexual abuse. ? Mental health disorders, including anxiety and depression. General treatment recommendations Physiologic non-epileptic seizure  If you have physiologic non-epileptic seizures, your health care provider will treat the cause. These seizures are not likely to return, and they do not need further treatment. PNES  Your health care provider may suspect PNES if: ? You have seizures that keep coming back (recurring) and do not respond to seizure medicines. ? You do not show any abnormal brain activity during electrical brain activity testing (electroencephalogram, or EEG).  It is very important to understand that PNES is a real illness. It does not mean that the seizure is fake. It just means that the cause is different. PNES can be treated.  You may be referred to a mental health specialist (psychiatrist). This is because PNES is a mental health disorder. Mental health treatment may include: ? Talk therapy (cognitive behavioral  therapy, or CBT). This is the most effective treatment. Through CBT, you will learn to identify and manage the psychological distress that leads to seizures. ? Stress reduction and relaxation techniques. ? Family therapy. ? Medicines to treat depression or anxiety.  In many cases, knowing that seizures are not caused by epilepsy reduces or stops seizures. How to manage lifestyle changes Managing stress     Certain types of counseling can be very helpful for managing stress. A mental health professional can assess what other treatments may also help you, such as:  Cognitive behavioral therapy.  Medicine to treat depression or anxiety.  Biofeedback. This uses signals from your body (physiological responses) to help you learn to regulate anxiety.  Meditation.  Yoga. Consider self-care strategies to lower stress levels, such as:  Talking with a trusted friend or family member about your thoughts and feelings.  Muscle relaxation and breathing exercises.  Trying activities to relieve stress, such as: ? Deep breathing. ? Listening to music. ? Exercising or taking a walk. ? Writing in a journal. ? Company secretary.  Relationships Make sure family members, friends, and co-workers are trained in how to help you if you have a seizure. If you have a seizure, people near you should:  Lay you on the ground to prevent a fall.  Place a pillow or piece of clothing under your head.  Loosen any tight clothing, especially around your neck.  Turn you onto your side. This helps keep your airway clear if you vomit. Make sure people do not try to hold you down, keep you still, or put anything in your mouth during a seizure. Follow these instructions at home: Medicines Medicines may be prescribed  to treat depression or anxiety that causes non-epileptic seizures. Avoid using alcohol and other substances that may prevent your medicines from working properly (may interact). It is also important  to:  Talk with your pharmacist or health care provider about all the medicines that you take, their possible side effects, and what medicines are safe to take together.  Make it your goal to take part in all treatment decisions (shared decision-making). Ask about possible side effects of medicines that your health care provider recommends, and tell him or her how you feel about having those side effects. It is best if shared decision-making with your health care provider is part of your total treatment plan.  Take over-the-counter and prescription medicines only as told by your health care provider. General instructions  Ask your health care provider if it is safe for you to drive.  Return to your normal activities as told by your health care provider. Ask your health care provider what activities are safe for you.  Keep all follow-up visits as told by your health care providers. This is important. Where to find support You can get support for managing non-epileptic seizures from support groups, either online or in-person. Your health care provider may be able to recommend a support group in your area. Where to find more information  Epilepsy Foundation: www.epilepsy.com  American Epilepsy Society: www.aesnet.org Contact a health care provider if:  Your seizures change or become more frequent.  You continue to have seizures after treatment. Get help right away if:  You injure yourself during a seizure.  You have: ? One seizure after another. ? Trouble recovering from a seizure. ? Chest pain or trouble breathing. ? A seizure that lasts longer than 5 minutes. These symptoms may represent a serious problem that is an emergency. Do not wait to see if the symptoms will go away. Get medical help right away. Call your local emergency services (911 in the [place] Do not drive yourself to the hospital. Summary  Seizures that are not caused by epilepsy are called non-epileptic seizures.  The two types of non-epileptic seizures are physiologic non-epileptic seizure and psychogenic non-epileptic seizure (PNES).  PNES is a treatable mental health disorder that is caused by psychological distress. It is very important to work with your mental health provider to find a treatment that works for you.  Learning to manage stress and anxiety is an important part of PNES treatment.  Make sure family members, friends, and co-workers are trained in how to help you if you have a seizure. If you have a seizure, they should lay you on the ground to prevent a fall, protect your head and neck, and turn you onto your side. This information is not intended to replace advice given to you by your health care provider. Make sure you discuss any questions you have with your health care provider. Document Released: 06/15/2017 Document Revised: 10/31/2018 Document Reviewed: 06/15/2017 Elsevier Patient Education  2020 [place] [phone]

## 2019-09-13 NOTE — Care Management (Signed)
CM spoke with EDP Mal Misty PA-C patient is noted to be active with [facility].  Patient and family concern over hospital stay, and the impact on his hospice services. CM will sent message to [facility] concerning patient hospital admission.

## 2019-09-14 DIAGNOSIS — I5042 Chronic combined systolic (congestive) and diastolic (congestive) heart failure: Secondary | ICD-10-CM | POA: Diagnosis present

## 2019-09-14 DIAGNOSIS — Z20828 Contact with and (suspected) exposure to other viral communicable diseases: Secondary | ICD-10-CM | POA: Diagnosis present

## 2019-09-14 DIAGNOSIS — J449 Chronic obstructive pulmonary disease, unspecified: Secondary | ICD-10-CM | POA: Diagnosis present

## 2019-09-14 DIAGNOSIS — C7931 Secondary malignant neoplasm of brain: Secondary | ICD-10-CM | POA: Diagnosis present

## 2019-09-14 DIAGNOSIS — I480 Paroxysmal atrial fibrillation: Secondary | ICD-10-CM | POA: Diagnosis present

## 2019-09-14 DIAGNOSIS — I5032 Chronic diastolic (congestive) heart failure: Secondary | ICD-10-CM

## 2019-09-14 DIAGNOSIS — I252 Old myocardial infarction: Secondary | ICD-10-CM | POA: Diagnosis not present

## 2019-09-14 DIAGNOSIS — Z7982 Long term (current) use of aspirin: Secondary | ICD-10-CM | POA: Diagnosis not present

## 2019-09-14 DIAGNOSIS — E1110 Type 2 diabetes mellitus with ketoacidosis without coma: Secondary | ICD-10-CM | POA: Diagnosis present

## 2019-09-14 DIAGNOSIS — R569 Unspecified convulsions: Secondary | ICD-10-CM | POA: Diagnosis present

## 2019-09-14 DIAGNOSIS — I10 Essential (primary) hypertension: Secondary | ICD-10-CM

## 2019-09-14 DIAGNOSIS — Z7902 Long term (current) use of antithrombotics/antiplatelets: Secondary | ICD-10-CM | POA: Diagnosis not present

## 2019-09-14 DIAGNOSIS — Z951 Presence of aortocoronary bypass graft: Secondary | ICD-10-CM | POA: Diagnosis not present

## 2019-09-14 DIAGNOSIS — E1122 Type 2 diabetes mellitus with diabetic chronic kidney disease: Secondary | ICD-10-CM | POA: Diagnosis present

## 2019-09-14 DIAGNOSIS — Z66 Do not resuscitate: Secondary | ICD-10-CM | POA: Diagnosis present

## 2019-09-14 DIAGNOSIS — Z515 Encounter for palliative care: Secondary | ICD-10-CM | POA: Diagnosis present

## 2019-09-14 DIAGNOSIS — I255 Ischemic cardiomyopathy: Secondary | ICD-10-CM | POA: Diagnosis present

## 2019-09-14 DIAGNOSIS — Z79899 Other long term (current) drug therapy: Secondary | ICD-10-CM | POA: Diagnosis not present

## 2019-09-14 DIAGNOSIS — Z888 Allergy status to other drugs, medicaments and biological substances status: Secondary | ICD-10-CM | POA: Diagnosis not present

## 2019-09-14 DIAGNOSIS — E785 Hyperlipidemia, unspecified: Secondary | ICD-10-CM | POA: Diagnosis present

## 2019-09-14 DIAGNOSIS — I2510 Atherosclerotic heart disease of native coronary artery without angina pectoris: Secondary | ICD-10-CM | POA: Diagnosis present

## 2019-09-14 DIAGNOSIS — C3490 Malignant neoplasm of unspecified part of unspecified bronchus or lung: Secondary | ICD-10-CM | POA: Diagnosis present

## 2019-09-14 DIAGNOSIS — I130 Hypertensive heart and chronic kidney disease with heart failure and stage 1 through stage 4 chronic kidney disease, or unspecified chronic kidney disease: Secondary | ICD-10-CM | POA: Diagnosis present

## 2019-09-14 LAB — CBG MONITORING, ED
Glucose-Capillary: 427 mg/dL — ABNORMAL HIGH (ref 70–99)
Glucose-Capillary: 293 mg/dL — ABNORMAL HIGH (ref 70–99)
Glucose-Capillary: 225 mg/dL — ABNORMAL HIGH (ref 70–99)
Glucose-Capillary: 156 mg/dL — ABNORMAL HIGH (ref 70–99)
Glucose-Capillary: 107 mg/dL — ABNORMAL HIGH (ref 70–99)

## 2019-09-14 LAB — BASIC METABOLIC PANEL
Anion gap: 18 — ABNORMAL HIGH (ref 5–15)
Anion gap: 12 (ref 5–15)
Anion gap: 12 (ref 5–15)
BUN: 17 mg/dL (ref 8–23)
BUN: 13 mg/dL (ref 8–23)
BUN: 12 mg/dL (ref 8–23)
CO2: 17 mmol/L — ABNORMAL LOW (ref 22–32)
CO2: 24 mmol/L (ref 22–32)
CO2: 24 mmol/L (ref 22–32)
Calcium: 8 mg/dL — ABNORMAL LOW (ref 8.9–10.3)
Calcium: 8.1 mg/dL — ABNORMAL LOW (ref 8.9–10.3)
Calcium: 8 mg/dL — ABNORMAL LOW (ref 8.9–10.3)
Chloride: 93 mmol/L — ABNORMAL LOW (ref 98–111)
Chloride: 96 mmol/L — ABNORMAL LOW (ref 98–111)
Chloride: 95 mmol/L — ABNORMAL LOW (ref 98–111)
Creatinine, Ser: 1.78 mg/dL — ABNORMAL HIGH (ref 0.61–1.24)
Creatinine, Ser: 1.6 mg/dL — ABNORMAL HIGH (ref 0.61–1.24)
Creatinine, Ser: 1.41 mg/dL — ABNORMAL HIGH (ref 0.61–1.24)
GFR calc Af Amer: 41 mL/min — ABNORMAL LOW (ref >60)
GFR calc Af Amer: 47 mL/min — ABNORMAL LOW (ref >60)
GFR calc Af Amer: 55 mL/min — ABNORMAL LOW (ref >60)
GFR calc non Af Amer: 36 mL/min — ABNORMAL LOW (ref >60)
GFR calc non Af Amer: 41 mL/min — ABNORMAL LOW (ref >60)
GFR calc non Af Amer: 47 mL/min — ABNORMAL LOW (ref >60)
Glucose, Bld: 394 mg/dL — ABNORMAL HIGH (ref 70–99)
Glucose, Bld: 85 mg/dL (ref 70–99)
Glucose, Bld: 114 mg/dL — ABNORMAL HIGH (ref 70–99)
Potassium: 3.8 mmol/L (ref 3.5–5.1)
Potassium: 3.2 mmol/L — ABNORMAL LOW (ref 3.5–5.1)
Potassium: 3.2 mmol/L — ABNORMAL LOW (ref 3.5–5.1)
Sodium: 128 mmol/L — ABNORMAL LOW (ref 135–145)
Sodium: 132 mmol/L — ABNORMAL LOW (ref 135–145)
Sodium: 131 mmol/L — ABNORMAL LOW (ref 135–145)

## 2019-09-14 LAB — GLUCOSE, CAPILLARY
Glucose-Capillary: 88 mg/dL (ref 70–99)
Glucose-Capillary: 64 mg/dL — ABNORMAL LOW (ref 70–99)
Glucose-Capillary: 69 mg/dL — ABNORMAL LOW (ref 70–99)
Glucose-Capillary: 117 mg/dL — ABNORMAL HIGH (ref 70–99)
Glucose-Capillary: 112 mg/dL — ABNORMAL HIGH (ref 70–99)
Glucose-Capillary: 143 mg/dL — ABNORMAL HIGH (ref 70–99)
Glucose-Capillary: 113 mg/dL — ABNORMAL HIGH (ref 70–99)

## 2019-09-14 LAB — SARS CORONAVIRUS 2 (TAT 6-24 HRS): SARS Coronavirus 2: NEGATIVE

## 2019-09-14 MED ORDER — FUROSEMIDE 20 MG PO TABS
20.0000 mg | ORAL_TABLET | Freq: Every day | ORAL | Status: DC
  Administered 2019-09-14 – 2019-09-15 (×2): 20 mg via ORAL
  Filled 2019-09-14 (×2): qty 1

## 2019-09-14 MED ORDER — METOPROLOL TARTRATE 25 MG PO TABS
25.0000 mg | ORAL_TABLET | Freq: Two times a day (BID) | ORAL | Status: DC
  Administered 2019-09-14 – 2019-09-15 (×2): 25 mg via ORAL
  Filled 2019-09-14 (×3): qty 1

## 2019-09-14 MED ORDER — SODIUM CHLORIDE 0.9 % IV SOLN
INTRAVENOUS | Status: AC
  Administered 2019-09-14: 02:00:00 via INTRAVENOUS

## 2019-09-14 MED ORDER — POTASSIUM CHLORIDE CRYS ER 20 MEQ PO TBCR
40.0000 meq | EXTENDED_RELEASE_TABLET | Freq: Once | ORAL | Status: AC
  Administered 2019-09-14: 40 meq via ORAL
  Filled 2019-09-14: qty 2

## 2019-09-14 MED ORDER — NITROGLYCERIN 0.4 MG SL SUBL: 0.4000 mg | SUBLINGUAL_TABLET | SUBLINGUAL | Status: DC | PRN

## 2019-09-14 MED ORDER — INSULIN GLARGINE 100 UNIT/ML ~~LOC~~ SOLN
25.0000 [IU] | Freq: Every day | SUBCUTANEOUS | Status: DC
  Administered 2019-09-14 (×2): 25 [IU] via SUBCUTANEOUS
  Filled 2019-09-14 (×3): qty 0.25

## 2019-09-14 MED ORDER — SODIUM CHLORIDE 0.9 % IV SOLN
750.0000 mg | Freq: Two times a day (BID) | INTRAVENOUS | Status: DC
  Administered 2019-09-14: 750 mg via INTRAVENOUS
  Filled 2019-09-14 (×2): qty 7.5

## 2019-09-14 MED ORDER — INSULIN ASPART 100 UNIT/ML ~~LOC~~ SOLN
0.0000 [IU] | Freq: Three times a day (TID) | SUBCUTANEOUS | Status: DC
  Administered 2019-09-14 – 2019-09-15 (×2): 1 [IU] via SUBCUTANEOUS

## 2019-09-14 MED ORDER — ISOSORBIDE MONONITRATE ER 30 MG PO TB24
30.0000 mg | ORAL_TABLET | Freq: Every day | ORAL | Status: DC
  Filled 2019-09-14: qty 1

## 2019-09-14 MED ORDER — ASPIRIN EC 81 MG PO TBEC
81.0000 mg | DELAYED_RELEASE_TABLET | Freq: Every day | ORAL | Status: DC
  Administered 2019-09-14 – 2019-09-15 (×2): 81 mg via ORAL
  Filled 2019-09-14 (×3): qty 1

## 2019-09-14 MED ORDER — SODIUM CHLORIDE 0.9 % IV SOLN: INTRAVENOUS | Status: DC

## 2019-09-14 MED ORDER — INSULIN ASPART 100 UNIT/ML ~~LOC~~ SOLN: 0.0000 [IU] | Freq: Every day | SUBCUTANEOUS | Status: DC

## 2019-09-14 MED ORDER — LEVETIRACETAM 750 MG PO TABS
750.0000 mg | ORAL_TABLET | Freq: Two times a day (BID) | ORAL | Status: DC
  Administered 2019-09-14 – 2019-09-15 (×2): 750 mg via ORAL
  Filled 2019-09-14 (×3): qty 1

## 2019-09-14 MED ORDER — AMIODARONE HCL 200 MG PO TABS
200.0000 mg | ORAL_TABLET | Freq: Every day | ORAL | Status: DC
  Administered 2019-09-14 – 2019-09-15 (×2): 200 mg via ORAL
  Filled 2019-09-14 (×2): qty 1

## 2019-09-14 MED ORDER — APIXABAN 5 MG PO TABS
5.0000 mg | ORAL_TABLET | Freq: Two times a day (BID) | ORAL | Status: DC
  Administered 2019-09-14 – 2019-09-15 (×4): 5 mg via ORAL
  Filled 2019-09-14 (×6): qty 1

## 2019-09-14 MED ORDER — CLOPIDOGREL BISULFATE 75 MG PO TABS
75.0000 mg | ORAL_TABLET | Freq: Every day | ORAL | Status: DC
  Administered 2019-09-14 – 2019-09-15 (×2): 75 mg via ORAL
  Filled 2019-09-14 (×2): qty 1

## 2019-09-14 MED ORDER — DEXAMETHASONE 4 MG PO TABS: 4.0000 mg | ORAL_TABLET | Freq: Three times a day (TID) | ORAL | Status: DC

## 2019-09-14 MED ORDER — DEXTROSE-NACL 5-0.45 % IV SOLN
INTRAVENOUS | Status: DC
  Administered 2019-09-14: 04:00:00 via INTRAVENOUS

## 2019-09-14 MED ORDER — ATORVASTATIN CALCIUM 10 MG PO TABS
20.0000 mg | ORAL_TABLET | Freq: Every day | ORAL | Status: DC
  Administered 2019-09-14 – 2019-09-15 (×2): 20 mg via ORAL
  Filled 2019-09-14 (×2): qty 2

## 2019-09-14 MED ORDER — POTASSIUM CHLORIDE 10 MEQ/100ML IV SOLN
10.0000 meq | INTRAVENOUS | Status: AC
  Administered 2019-09-14 (×2): 10 meq via INTRAVENOUS
  Filled 2019-09-14 (×2): qty 100

## 2019-09-14 NOTE — ED Notes (Signed)
Glucostablizer placed at lowest setting at 0.1units/hr per Baltazar Najjar, MD verbal orders.

## 2019-09-14 NOTE — Progress Notes (Signed)
RN removed foley. Site intact, some redness noted at urethra and around scrotum. Per MD, condom cath not placed due to redness. Patient verbalized ability to tell when he has to urinate, agreed to call for assistance with urinal. Will continue to monitor output.

## 2019-09-14 NOTE — Progress Notes (Signed)
Foley removed at 1100, patient has still not voided. Bladder scan obtained, retaining >560 mL. Patient resting comfortably in bed, states he does not feel like he needs to urinate. RN encouraged patient to use urinal, patient refused. MD paged with results. Will continue to monitor.

## 2019-09-14 NOTE — ED Notes (Signed)
MD Norins made aware of patient of current blood pressure. Continue to monitor and report to MD if BP drops SBP below 80. No orders at this time.

## 2019-09-14 NOTE — Progress Notes (Signed)
Patient removed second IV and keeps taking off cardiac monitor. Has periods of confusion where he cannot remember where he is. Spoke with grandson, says no history of dementia, confusion is related to brain cancer. Will page MD to see if tele orders can be DC'd and if IVPB keppra can be switched to PO. Will continue to monitor.

## 2019-09-14 NOTE — ED Notes (Signed)
Attempted to call report to [facility]

## 2019-09-14 NOTE — Progress Notes (Signed)
Inpatient Diabetes Program Recommendations  AACE/ADA: New Consensus Statement on Inpatient Glycemic Control (2015)  Target Ranges:  Prepandial:   less than 140 mg/dL      Peak postprandial:   less than 180 mg/dL (1-2 hours)      Critically ill patients:  140 - 180 mg/dL   Lab Results  Component Value Date   GLUCAP 64 (L) 09/14/2019   HGBA1C 10.8 (H) 06/05/2019    Review of Glycemic Control Results for Gregory Bates, Gregory Bates (MRN [id number]) as of 09/14/2019 09:19  Ref. Range 09/14/2019 04:50 09/14/2019 05:47 09/14/2019 06:56  Glucose-Capillary Latest Ref Range: 70 - 99 mg/dL 107 (H) 88 64 (L)   Diabetes history: Type 2 DM Outpatient Diabetes medications: Lantus 25 units QHS, Novolog 10 units TID, Metformin 500 mg BID Current orders for Inpatient glycemic control: Lantus 25 units QHS, Novolog 0-9 units TID, Novolog 0-5 units QHS  Inpatient Diabetes Program Recommendations:    Noted hypoglycemia this AM of 64 mg/dL. Would recommend decreasing Lantus to 18 units QHS.   Will continue to follow.   Thanks, Bronson Curb, MSN, RNC-OB Diabetes Coordinator [phone] (8a-5p)

## 2019-09-14 NOTE — Progress Notes (Signed)
Received call back from [place], Neuro-PA, okay to switch patient to PO keppra. Verbal order placed.

## 2019-09-14 NOTE — TOC Progression Note (Signed)
Transition of Care [facility]) - Progression Note    Patient Details  Name: Gregory Bates MRN: [id number] Date of Birth: Aug 01, 1941  Transition of Care [facility]) CM/SW Contact  Graves-Bigelow, Ocie Cornfield, RN Phone Number: 09/14/2019, 3:09 PM  Clinical Narrative:  Pt presented from home with family support. Active with [facility]. CM has spoken to Webb Silversmith and she will follow the patient in the hospital. Patient has all DME in the home. CM will continue to follow for additional transition of care needs.   Expected Discharge Plan: Home w Hospice Care Barriers to Discharge: Continued Medical Work up  Expected Discharge Plan and Services Expected Discharge Plan: [place] In-house Referral: NA Discharge Planning Services: CM Consult Post Acute Care Choice: Resumption of Svcs/PTA Provider, Hospice Living arrangements for the past 2 months: [place]: RN [facility] Agency: [place] Date [place]: 09/14/19 Time HH Agency Contacted: 1000 Representative spoke with at [place]: [place].   Social Determinants of Health (SDOH) Interventions    Readmission Risk Interventions Readmission Risk Prevention Plan 09/14/2019  Medication Review ([place]) Complete  PCP or Specialist appointment within 3-5 days of discharge Complete  HRI or [place] Complete  SW Recovery Care/Counseling Consult Complete  [place] Not Applicable  Some recent data might be hidden

## 2019-09-14 NOTE — H&P (Signed)
History and Physical    Gregory Bates BDZ:[id number] DOB: Feb 26, 1941 DOA: 09/13/2019  PCP: Street, Sharon Mt, MD (Confirm with patient/family/NH records and if not entered, this has to be entered at [facility] point of entry) Patient coming from: Patient is coming from home  I have personally briefly reviewed patient's old medical records in [place]  Chief Complaint: Seizure with loss of consciousness  HPI: Gregory Bates is a 78 y.o. 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  33 of history provided by nephew over the phone.  States that patient had an episode that lasted several seconds just prior to arrival where he was "convulsing like he was having a seizure."  He then lost consciousness for several minutes until EMS arrived.    He did spontaneously regain consciousness.  States that EMS checked CBG and was over 500.  States that at baseline patient is able to answer only yes or no questions and is currently in hospice care.  Nephew unaware of any recent injuries or falls.  No history of seizure-like activity in the past.  Denies any sick contacts with similar symptoms, complaints of pain or recent changes to medications.  He was transported to [facility] emergency department for evaluation   ED Course: Patient was hemodynamically stable in the emergency department.  He was awake and alert.  Lab work revealed a glucose of 488, anion gap 20, creatinine 1.94, total protein low at 5.6, hemoglobin low at 10.9.  Chest x-ray with no active disease.  CT of the head showed improved diffuse metastatic disease.  Neurology was contacted who opined the patient had new's onset seizure recommended initiating Keppra.  With the patient's elevated serum glucose and anion gap glucose control her protocol was initiated.  TRH was called to admit the patient  Review of  Systems: As per HPI otherwise 10 point review of systems negative. C/o feeling cold   Past Medical History:  Diagnosis Date   Acute renal insufficiency    a. During 11/2013 adm with atrial flutter.   Anemia, unspecified    Brain metastases (HCC)    CAD (coronary artery disease), native coronary artery    a. s/p prior MIs and multiple stents then CABG with LA clipping in 07/2018.   Chronic combined systolic and diastolic CHF (congestive heart failure) ([place]) 12/23/2015   Echo 2/17: Mild LVH, EF 45-50%, inferior akinesis, grade 2 diastolic dysfunction, mild AI, mild MR, mild LAE, PASP 44 mmHg    CKD (chronic kidney disease), stage III ([place])    Colorectal polyps 2002   Diabetes mellitus    AODM   Erectile dysfunction    History of echocardiogram    a. 01/2013 Echo: EF 55%, mid-dist inflat HK, Gr1 DD, Triv AI/TR, Mild MR.;  b.  Echo (1/15): Mild LVH, EF 55%, inferolateral hypokinesis, grade 1 diastolic dysfunction, mild AI, trivial MR, moderate LAE, PASP 36 mmHg    Hx of cardiovascular stress test    Stress myoview 08/07/13 with LVEF 44%, inferior scar, no ischemia   Hyperlipidemia    Hypertension    Hyponatremia    Ischemic cardiomyopathy    a. EF 35-40% by LHC in 1/16; b. Echo 2/17: Mild LVH, EF 45-50%, inferior akinesis, grade 2 diastolic dysfunction, mild AI, mild MR, mild LAE, PASP 44 mmHg   Mild aortic insufficiency    Mild mitral regurgitation  Mild tricuspid regurgitation    Myocardial infarction (HCC)    Oxygen deficiency    PAF (paroxysmal atrial fibrillation) (HCC)    Paroxysmal atrial flutter (HCC)    Small cell lung cancer in adult [facility]) 07/28/2018   Syncope     Past Surgical History:  Procedure Laterality Date   BIOPSY  08/01/2018   Procedure: BIOPSY OF PERICARDIAL LYMPH NODE AND HILAR LYMPH NODE;  Surgeon: Grace Isaac, MD;  Location[facility] OR;  Service: Open Heart Surgery;;   CLIPPING OF ATRIAL APPENDAGE Left 08/01/2018   Procedure:  CLIPPING OF ATRIAL APPENDAGE;  Surgeon: Grace Isaac, MD;  Location: [place];  Service: Open Heart Surgery;  Laterality: Left;   COLONOSCOPY W/ POLYPECTOMY  2002   [facility]   COLONOSCOPY W/ POLYPECTOMY  2004; 2007 negative   CORONARY ANGIOPLASTY  12/26/2013   CORONARY ARTERY BYPASS GRAFT N/A 08/01/2018   Procedure: CORONARY ARTERY BYPASS GRAFTING (CABG) TIMES THREE USING LEFT INTERNAL MAMMARY ARTERY TO LAD, RIGHT GREATER SAPHENOUS VEIN GRAFT TO OM AND RCA, VEIN HARVESTED ENDOSCOPICALLY;  Surgeon: Grace Isaac, MD;  Location: [place];  Service: Open Heart Surgery;  Laterality: N/A;   CORONARY STENT PLACEMENT  2007, 2011   infantile paralysis facial asymmetry     LEFT HEART CATH AND CORONARY ANGIOGRAPHY N/A 05/18/2017   Procedure: Left Heart Cath and Coronary Angiography;  Surgeon: Martinique, Peter M, MD;  Location: [place] CV LAB;  Service: Cardiovascular;  Laterality: N/A;   LEFT HEART CATH AND CORONARY ANGIOGRAPHY N/A 07/27/2018   Procedure: LEFT HEART CATH AND CORONARY ANGIOGRAPHY;  Surgeon: Martinique, Peter M, MD;  Location: [place] CV LAB;  Service: Cardiovascular;  Laterality: N/A;   LEFT HEART CATHETERIZATION WITH CORONARY ANGIOGRAM N/A 10/02/2012   Procedure: LEFT HEART CATHETERIZATION WITH CORONARY ANGIOGRAM;  Surgeon: Peter M Martinique, MD;  Location: [facility] CATH LAB;  Service: Cardiovascular;  Laterality: N/A;   LEFT HEART CATHETERIZATION WITH CORONARY ANGIOGRAM N/A 02/20/2013   Procedure: LEFT HEART CATHETERIZATION WITH CORONARY ANGIOGRAM;  Surgeon: Burnell Blanks, MD;  Location: [facility] CATH LAB;  Service: Cardiovascular;  Laterality: N/A;   LEFT HEART CATHETERIZATION WITH CORONARY ANGIOGRAM N/A 12/26/2013   Procedure: LEFT HEART CATHETERIZATION WITH CORONARY ANGIOGRAM;  Surgeon: Burnell Blanks, MD;  Location: [facility] CATH LAB;  Service: Cardiovascular;  Laterality: N/A;   LEFT HEART CATHETERIZATION WITH CORONARY ANGIOGRAM N/A 11/28/2014   Procedure: LEFT HEART  CATHETERIZATION WITH CORONARY ANGIOGRAM;  Surgeon: Burnell Blanks, MD;  Location: [facility] CATH LAB;  Service: Cardiovascular;  Laterality: N/A;   PERCUTANEOUS CORONARY STENT INTERVENTION (PCI-S)  10/02/2012   Procedure: PERCUTANEOUS CORONARY STENT INTERVENTION (PCI-S);  Surgeon: Peter M Martinique, MD;  Location: [facility] CATH LAB;  Service: Cardiovascular;;   PERCUTANEOUS CORONARY STENT INTERVENTION (PCI-S)  02/20/2013   Procedure: PERCUTANEOUS CORONARY STENT INTERVENTION (PCI-S);  Surgeon: Burnell Blanks, MD;  Location: [facility] CATH LAB;  Service: Cardiovascular;;   PERCUTANEOUS CORONARY STENT INTERVENTION (PCI-S)  12/26/2013   Procedure: PERCUTANEOUS CORONARY STENT INTERVENTION (PCI-S);  Surgeon: Burnell Blanks, MD;  Location: [facility] CATH LAB;  Service: Cardiovascular;;   TEE WITHOUT CARDIOVERSION N/A 08/01/2018   Procedure: TRANSESOPHAGEAL ECHOCARDIOGRAM (TEE);  Surgeon: Grace Isaac, MD;  Location: [place];  Service: Open Heart Surgery;  Laterality: N/A;   WISDOM TOOTH EXTRACTION     Soc Hx -high school graduate.  Marriage was 10 years did in divorce.  Married to his second wife for 38 years.  He has 1 daughter who lives nearby and  helps with the home.  He has 5 grandchildren.  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  He and his wife to live alone.  Signed up with hospice   reports that he quit smoking about 32 years ago. His smoking use included cigarettes. He has a 60.00 pack-year smoking history. He has never used smokeless tobacco. He reports that he does not drink alcohol or use drugs.  Allergies  Allergen Reactions   Brilinta [Ticagrelor] Other (See Comments)    Arthralgias & myalgias   Crestor [Rosuvastatin] Other (See Comments)    Myalgias    Family History  Problem Relation Age of Onset   Heart attack Brother 65   Colon cancer Brother    Prostate cancer Brother    Breast cancer Sister    Stroke Sister    Diabetes Neg Hx    Hypertension Neg Hx     Unacceptable: Noncontributory, unremarkable, or negative. Acceptable: Family history reviewed and not pertinent (If you reviewed it)  Prior to Admission medications   Medication Sig Start Date End Date Taking? Authorizing Provider  acetaminophen (TYLENOL) 325 MG tablet Take 2 tablets (650 mg total) by mouth every 6 (six) hours as needed for mild pain. 08/06/18   Nani Skillern, PA-C  amiodarone (PACERONE) 200 MG tablet TAKE 1 TABLET (200 MG TOTAL) BY MOUTH DAILY. 08/09/19   Burnell Blanks, MD  apixaban (ELIQUIS) 5 MG TABS tablet Take 1 tablet (5 mg total) by mouth 2 (two) times daily. 01/18/19   Burnell Blanks, MD  aspirin EC 81 MG EC tablet Take 1 tablet (81 mg total) by mouth daily. 08/06/18   Lars Pinks M, PA-C  atorvastatin (LIPITOR) 20 MG tablet TAKE 1 TABLET EVERY DAY 10/31/18   Burnell Blanks, MD  cholecalciferol (VITAMIN D) 1000 UNITS tablet Take 1,000 Units by mouth every evening.     [provider]  clopidogrel (PLAVIX) 75 MG tablet TAKE 1 TABLET EVERY DAY WITH BREAKFAST 08/09/19   Burnell Blanks, MD  dexamethasone (DECADRON) 4 MG tablet Take 1 tablet (4 mg total) by mouth 3 (three) times daily. 06/28/19   Donne Hazel, MD  fexofenadine (ALLEGRA) 180 MG tablet Take 180 mg by mouth daily as needed for allergies.     [provider]  furosemide (LASIX) 20 MG tablet TAKE 1 TABLET (20 MG TOTAL) BY MOUTH DAILY. 08/09/19   Burnell Blanks, MD  insulin aspart (NOVOLOG) 100 UNIT/ML injection Inject 10 Units into the skin 3 (three) times daily with meals. 06/08/19 08/07/19  Cherylann Ratel A, DO  insulin glargine (LANTUS) 100 UNIT/ML injection Inject 0.25 mLs (25 Units total) into the skin at bedtime. 06/08/19 08/07/19  Cherylann Ratel A, DO  Iron Combinations (IRON COMPLEX PO) Take 1 tablet by mouth at bedtime.     [provider]  isosorbide mononitrate (IMDUR) 30 MG 24 hr tablet Take 1 tablet (30 mg total) by mouth  daily. 07/11/19 10/09/19  Dunn, Nedra Hai, PA-C  metFORMIN (GLUCOPHAGE) 500 MG tablet Take 500 mg by mouth 2 (two) times daily. 06/15/19   [provider]  metoprolol tartrate (LOPRESSOR) 25 MG tablet TAKE 1 TABLET TWICE DAILY 12/21/18   Burnell Blanks, MD  Multiple Vitamin (MULTIVITAMIN) tablet Take 1 tablet by mouth at bedtime.     [provider]  nitroGLYCERIN (NITROSTAT) 0.4 MG SL tablet Place 1 tablet (0.4 mg total) under the tongue every 5 (five) minutes as needed for chest pain. 05/12/19 08/10/19  Burnell Blanks, MD  vitamin E 100 UNIT capsule Take 100 Units by mouth at bedtime.     [provider]    Physical Exam: Vitals:   09/13/19 1923 09/13/19 2119 09/13/19 2200 09/13/19 2230  BP:  (!) 99/47 (!) 99/46 (!) 101/52  Pulse:  67 64   Resp:  19 (!) 23 18  Temp:      TempSrc:      SpO2:  97% 98%   Weight: 68 kg     Height: 5\' 11"  (1.803 m)       Constitutional: NAD, calm, comfortable Vitals:   09/13/19 1923 09/13/19 2119 09/13/19 2200 09/13/19 2230  BP:  (!) 99/47 (!) 99/46 (!) 101/52  Pulse:  67 64   Resp:  19 (!) 23 18  Temp:      TempSrc:      SpO2:  97% 98%   Weight: 68 kg     Height: 5\' 11"  (1.803 m)      General appearance: Chronically ill-appearing gentleman in no distress  eyes: PERRL, droopy left eyelid  and conjunctivae normal ENMT: Mucous membranes are moist. Posterior pharynx clear of any exudate or lesions.edentulous with full dentures or dentures out Neck: normal, supple, no masses, no thyromegaly Respiratory: clear to auscultation bilaterally, no wheezing, no crackles. Normal respiratory effort. No accessory muscle use.  Cardiovascular: Regular rate and rhythm, no murmurs / rubs / gallops. No extremity edema. 2+ pedal pulses. No carotid bruits.  Abdomen: no tenderness, no masses palpated. No hepatosplenomegaly. Bowel sounds positive.  Musculoskeletal: no clubbing / cyanosis. No joint deformity upper and lower  extremities. Good ROM, no contractures. Normal muscle tone.  Skin: no rashes, lesions, ulcers. No induration Neurologic: CN 2-12 grossly intact. Sensation intact,  Psychiatric: Normal judgment and insight. Alert and oriented x 3. Normal mood.     Labs on Admission: I have personally reviewed following labs and imaging studies  CBC: Recent Labs  Lab 09/13/19 1927 09/13/19 2112  WBC 9.0  --   NEUTROABS 6.6  --   HGB 10.9* 11.6*  HCT 31.8* 34.0*  MCV 88.6  --   PLT 237  --    Basic Metabolic Panel: Recent Labs  Lab 09/13/19 1927 09/13/19 2112  NA 124* 125*  K 3.5 3.4*  CL 87* 88*  CO2 17*  --   GLUCOSE 488* 511*  BUN 17 17  CREATININE 1.94* 1.70*  CALCIUM 7.7*  --    GFR: Estimated Creatinine Clearance: 34.4 mL/min (A) (by C-G formula based on SCr of 1.7 mg/dL (H)). Liver Function Tests: Recent Labs  Lab 09/13/19 1927  AST 26  ALT 14  ALKPHOS 73  BILITOT 1.3*  PROT 5.6*  ALBUMIN 2.9*   No results for input(s): LIPASE, AMYLASE in the last 168 hours. No results for input(s): AMMONIA in the last 168 hours. Coagulation Profile: Recent Labs  Lab 09/13/19 1927  INR 1.9*   Cardiac Enzymes: No results for input(s): CKTOTAL, CKMB, CKMBINDEX, TROPONINI in the last 168 hours. BNP (last 3 results) No results for input(s): PROBNP in the last 8760 hours. HbA1C: No results for input(s): HGBA1C in the last 72 hours. CBG: Recent Labs  Lab 09/13/19 2015 09/13/19 2216 09/13/19 2357  GLUCAP 471* 544* 499*   Lipid Profile: No results for input(s): CHOL, HDL, LDLCALC, TRIG, CHOLHDL, LDLDIRECT in the last 72 hours. Thyroid Function Tests: No results for input(s): TSH, T4TOTAL, FREET4, T3FREE, THYROIDAB in the last 72 hours. Anemia Panel: No results for  input(s): VITAMINB12, FOLATE, FERRITIN, TIBC, IRON, RETICCTPCT in the last 72 hours. Urine analysis:    Component Value Date/Time   COLORURINE YELLOW 09/13/2019 2012   APPEARANCEUR CLEAR 09/13/2019 2012   LABSPEC  1.007 09/13/2019 2012   PHURINE 6.0 09/13/2019 2012   GLUCOSEU >=500 (A) 09/13/2019 2012   HGBUR NEGATIVE 09/13/2019 2012   [place] NEGATIVE 09/13/2019 2012   [place] 09/13/2019 2012   PROTEINUR NEGATIVE 09/13/2019 2012   NITRITE NEGATIVE 09/13/2019 2012   LEUKOCYTESUR NEGATIVE 09/13/2019 2012    Radiological Exams on Admission: Dg Chest 2 View  Result Date: 09/13/2019 CLINICAL DATA:  Altered mental status. EXAM: CHEST - 2 VIEW COMPARISON:  06/25/2019 FINDINGS: Heart size and pulmonary vascularity are normal. No infiltrates or effusions. Tiny area of atelectasis at the left base laterally. CABG. Clip on the left atrial appendage. Aortic atherosclerosis. No significant bone abnormality. IMPRESSION: 1. No significant acute abnormalities. 2. Minimal atelectasis at the left base. 3. Aortic atherosclerosis. Electronically Signed   By: Lorriane Shire M.D.   On: 09/13/2019 20:45   Ct Head Wo Contrast  Result Date: 09/13/2019 CLINICAL DATA:  Altered mental status EXAM: CT HEAD WITHOUT CONTRAST TECHNIQUE: Contiguous axial images were obtained from the base of the skull through the vertex without intravenous contrast. COMPARISON:  06/04/2019, MRI from 06/05/2019 FINDINGS: Brain: There again noted multiple intracranial metastases particularly within the left frontal lobe, left cerebellar hemisphere and right parietal lobe near the vertex these are slightly less prominent than that seen on the prior exam with decreased overall vasogenic edema. Some of the previously seen metastatic lesions on MRI are not well appreciated on today's exam. This is consistent with the interval radiation therapy. Left sphenoid wing meningioma is noted and stable in appearance. No new lesions are identified. No findings to suggest acute hemorrhage or acute infarction are seen. Chronic atrophic and ischemic changes are again noted. Vascular: No hyperdense vessel or unexpected calcification. Skull: Normal. Negative  for fracture or focal lesion. Sinuses/Orbits: Mucosal thickening is noted within the maxillary antra bilaterally. Soft tissue irregularity in the nasal passages is noted which was not appreciated on the prior exam due to the imaging technique. The septum is less well visualized. These changes are likely related to some septal necrosis from previous radiation therapy. Other: Increased fluid is noted within the mastoid air cells on the left. IMPRESSION: Persistent but improved appearance of diffuse intracranial metastatic disease. No acute hemorrhage is seen. Stable left sphenoid wing meningioma. Chronic atrophic and ischemic changes stable from the prior exam. Irregularity in the nasal passages is noted. The septum appears less prominent with surrounding soft tissue density seen. This is new from previous exams and likely related to prior radiation therapy. Direct visualization may be helpful for further evaluation. Electronically Signed   By: Inez Catalina M.D.   On: 09/13/2019 20:11    EKG: Independently reviewed.  EKG reveals sinus rhythm, nonspecific intraventricular conduction delay.  Assessment/Plan Active Problems:   Type 2 diabetes mellitus with vascular disease ([place])   Essential hypertension   Chronic diastolic CHF (congestive heart failure) ([place])   Seizure-like activity ([place])   DKA (diabetic ketoacidoses) ([place])  (please populate well all problems here in Problem List. (For example, if patient is on BP meds at home and you resume or decide to hold them, it is a problem that needs to be her. Same for CAD, COPD, HLD and so on)   1.  DKA -presents with moderately elevated blood sugar and moderately elevated  anion gap.  Acording to his wife he has been taking his basal insulin therapy.  No prior history of DKA. Plan close control protocol  To a progressive unit  Will resume basal insulin therapy 10/14/2019  2.  Chronic diastolic heart failure last echo 08/01/2018 with an EF of 45 to 50% and  significant diastolic dysfunction.  Patient appears to be stable with no signs of decompensation at admission.  He has received a large amount of fluids needs to be watched carefully Plan continue his home medications  3.  Essential hypertension -continue home medications  4.  Small cell lung cancer with metastasis including brain mets including brain mets -patient is on daily Decadron.  CT scan showed improvement in diffuse brain mets.  He does follow with oncology and is currently receiving palliative chemotherapy.  He has enrolled in hospice care.  He is a DNR  5.  New onset seizure -by description patient had a seizure at home.  Whether he had a postictal episode.  Neurology felt that the improvement in brain mets made that a less likely cause of seizure and that he may have a new seizure disorder. Plan Keppra has been initiated  Given the patient's poor prognosis there is no work-up at this time is not warranted unless Keppra  fails to control him.   DVT prophylaxis: Lovenox (Lovenox/Heparin/SCD's/anticoagulated/None (if comfort care) Code Status: DNR (Full/Partial (specify details) Family Communication: Spoke with Mrs. Heiler planar treatment plan.  Firm with her as CODE STATUS.  (Specify name, relationship. Do not write "discussed with patient". Specify tel # if discussed over the phone) Disposition Plan: Home when medically stable (specify when and where you expect patient to be discharged) Consults called: Neurology (with names) Admission status: Inpatient-SDU (inpatient / obs / tele / medical floor / SDU)   Adella Hare MD [facility] Pager [phone]  If 7PM-7AM, please contact night-coverage [email or web address] Password [facility]  09/14/2019, 12:55 AM

## 2019-09-14 NOTE — ED Notes (Signed)
PAGED IM TO RN ANDRUW--Roslin Norwood

## 2019-09-14 NOTE — Progress Notes (Signed)
Received verbal order from Kurtis Bushman, MD to DC tele orders. Paged neurology to see about switching IV keppra to PO.

## 2019-09-14 NOTE — Progress Notes (Addendum)
Chart reviewed.  Patient was admitted today please see full HPI.  Gregory Bates is a 77 y.o. 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 68 of history provided by nephew over the phone. States that patient had an episode that lasted several seconds just prior to arrival where he was "convulsing like he was having a seizure." He then lost consciousness for several minutes until EMS arrived.   He did spontaneously regain consciousness. States that EMS checked CBG and was over 500. States that at baseline patient is able to answer only yes or no questions and is currently in hospice care.   Assessment: 1.DKA 2. Chronic DHF 3. Ess. HTN 4. New onset seizure   Plan: Start IV keppra F/u neurology Monitor labs Continue insulin, ck fs bp low , will d/c Imdur for now

## 2019-09-14 NOTE — Progress Notes (Signed)
[facility] [place]: [facility]  Pt is active with Hospice services at [place]. Grandson Jerelyn Scott  is unsure if the pt is going to continue to have seizures if his grand- mother will be able to manage him at home. We discussed how these would be managed in the home going forward. He would like to see how pt does over next 24-48 hours to see if he is stable and talk with doctors about the thoughts of the seizure activity which is new before he decides if pt can go back home or will possible need placement. This will be a covered/related hospitalization for [facility] [place]. I have also sent a face sheet, plan of care and medication list to the fax on 6E so that it can be utilized for hospital records if needed. Webb Silversmith RN [phone]

## 2019-09-15 ENCOUNTER — Telehealth: Payer: Self-pay

## 2019-09-15 LAB — BASIC METABOLIC PANEL
Anion gap: 11 (ref 5–15)
BUN: 8 mg/dL (ref 8–23)
CO2: 22 mmol/L (ref 22–32)
Calcium: 8.2 mg/dL — ABNORMAL LOW (ref 8.9–10.3)
Chloride: 100 mmol/L (ref 98–111)
Creatinine, Ser: 1.3 mg/dL — ABNORMAL HIGH (ref 0.61–1.24)
GFR calc Af Amer: >60 mL/min (ref >60)
GFR calc non Af Amer: 52 mL/min — ABNORMAL LOW (ref >60)
Glucose, Bld: 64 mg/dL — ABNORMAL LOW (ref 70–99)
Potassium: 3.2 mmol/L — ABNORMAL LOW (ref 3.5–5.1)
Sodium: 133 mmol/L — ABNORMAL LOW (ref 135–145)

## 2019-09-15 LAB — URINE CULTURE: Culture: NO GROWTH

## 2019-09-15 LAB — GLUCOSE, CAPILLARY
Glucose-Capillary: 55 mg/dL — ABNORMAL LOW (ref 70–99)
Glucose-Capillary: 126 mg/dL — ABNORMAL HIGH (ref 70–99)

## 2019-09-15 MED ORDER — LEVETIRACETAM 750 MG PO TABS: 750.0000 mg | ORAL_TABLET | Freq: Two times a day (BID) | ORAL | 0 refills | Status: DC

## 2019-09-15 MED ORDER — INSULIN GLARGINE 100 UNIT/ML ~~LOC~~ SOLN
20.0000 [IU] | Freq: Every day | SUBCUTANEOUS | Status: DC
  Filled 2019-09-15: qty 0.2

## 2019-09-15 MED ORDER — NITROGLYCERIN 0.4 MG SL SUBL: 0.4000 mg | SUBLINGUAL_TABLET | SUBLINGUAL | 2 refills | Status: AC | PRN

## 2019-09-15 MED ORDER — LEVETIRACETAM 750 MG PO TABS: 750.0000 mg | ORAL_TABLET | Freq: Two times a day (BID) | ORAL | 0 refills | Status: AC

## 2019-09-15 MED ORDER — METOPROLOL TARTRATE 25 MG PO TABS: 25.0000 mg | ORAL_TABLET | Freq: Two times a day (BID) | ORAL | 2 refills | Status: AC

## 2019-09-15 MED ORDER — INSULIN GLARGINE 100 UNIT/ML ~~LOC~~ SOLN: 18.0000 [IU] | Freq: Every day | SUBCUTANEOUS | 1 refills | Status: AC

## 2019-09-15 MED ORDER — INSULIN GLARGINE 100 UNIT/ML ~~LOC~~ SOLN
18.0000 [IU] | Freq: Every day | SUBCUTANEOUS | Status: DC
  Filled 2019-09-15: qty 0.18

## 2019-09-15 MED ORDER — POTASSIUM CHLORIDE CRYS ER 20 MEQ PO TBCR
30.0000 meq | EXTENDED_RELEASE_TABLET | Freq: Once | ORAL | Status: AC
  Administered 2019-09-15: 30 meq via ORAL
  Filled 2019-09-15: qty 1

## 2019-09-15 NOTE — Final Progress Note (Signed)
Patient has not voided since foley cath was d/c on 09/14/19 at 11:00 AM. Bladder scan done and showed 692 cc. On call Bodenheimer C., NP made aware. Order received to In & out cath patient.  In and out attempted x 1, but was unsuccessful.  On call Bodenheimer made aware.  Will continue to monitor pt.

## 2019-09-15 NOTE — Discharge Summary (Signed)
MCCORMICK MACON YFV:[id number] DOB: 05-Dec-1940 DOA: 09/13/2019  PCP: Emmaline Kluver, MD  Admit date: 09/13/2019 Discharge date: 09/15/2019  Admitted From: Home Disposition: Home   Recommendations for Outpatient Follow-up:  1. Follow up with PCP in 1 week 2. Please obtain BMP/CBC in one week 3. Please follow up on the following pending results:  Home Health: Hospice   Discharge Condition:Stable CODE STATUS: DNR Diet recommendation Carb Modified   Brief/Interim Summary: KENO CARAWAY is a 78 y.o. 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 33 of history provided by nephew over the phone. States that patient had an episode that lasted several seconds just prior to arrival where he was "convulsing like he was having a seizure." He then lost consciousness for several minutes until EMS arrived.   He did spontaneously regain consciousness. States that EMS checked CBG and was over 500. Pt is currently in hospice care.  CT of the head showed improved diffuse metastatic disease.  Neurology was contacted who opined the patient had new's onset seizure recommended initiating Keppra.  With the patient's elevated serum glucose and anion gap glucose control protocol was initiated.  TRH was called to admit the patient.  His blood glucose levels improved.  Diabetic coordinator education was consulted.  They saw the patient.  They recommended decreasing Lantus to 18 units since his morning blood glucose was lower.  He has had no further seizures while in the hospital.  He has had urinary retention and we have decided to place a Foley catheter and he will need to follow-up with his primary care for further evaluation.  We also discontinued his Imdur as his blood pressure was low and decreased his metoprolol to 25 mg twice daily as he is going home.  He is stable to be  discharged back home with hospice.  Discharge Diagnoses:  Active Problems:   Type 2 diabetes mellitus with vascular disease ([place])   Essential hypertension   Chronic diastolic CHF (congestive heart failure) ([place])   Seizure-like activity ([place])   DKA (diabetic ketoacidoses) [facility])    Discharge Instructions  Discharge Instructions    Call MD for:  difficulty breathing, headache or visual disturbances   Complete by: As directed    Call MD for:  temperature >100.4   Complete by: As directed    Diet - low sodium heart healthy   Complete by: As directed    Discharge instructions   Complete by: As directed    F/u with pcp in 1 week, f/u with neurology in one week. Will need foley be evaluated as outpt -   Increase activity slowly   Complete by: As directed      Allergies as of 09/15/2019      Reactions   Brilinta [ticagrelor] Other (See Comments)   Arthralgias & myalgias   Crestor [rosuvastatin] Other (See Comments)   Myalgias      Medication List    STOP taking these medications   isosorbide mononitrate 30 MG 24 hr tablet Commonly known as: IMDUR     TAKE these medications   acetaminophen 325 MG tablet Commonly known as: TYLENOL Take 2 tablets (650 mg total) by mouth every 6 (six) hours as needed for mild pain.   amiodarone 200 MG tablet Commonly known as: PACERONE TAKE 1 TABLET (200 MG TOTAL) BY MOUTH DAILY.   apixaban 5 MG Tabs tablet Commonly  known as: Eliquis Take 1 tablet (5 mg total) by mouth 2 (two) times daily.   aspirin 81 MG EC tablet Take 1 tablet (81 mg total) by mouth daily.   atorvastatin 20 MG tablet Commonly known as: LIPITOR TAKE 1 TABLET EVERY DAY   clopidogrel 75 MG tablet Commonly known as: PLAVIX TAKE 1 TABLET EVERY DAY WITH BREAKFAST What changed: See the new instructions.   dexamethasone 4 MG tablet Commonly known as: DECADRON Take 1 tablet (4 mg total) by mouth 3 (three) times daily.   furosemide 20 MG tablet Commonly known as:  LASIX TAKE 1 TABLET (20 MG TOTAL) BY MOUTH DAILY. What changed: See the new instructions.   insulin aspart 100 UNIT/ML injection Commonly known as: novoLOG Inject 10 Units into the skin 3 (three) times daily with meals.   insulin glargine 100 UNIT/ML injection Commonly known as: LANTUS Inject 0.18 mLs (18 Units total) into the skin at bedtime. What changed: how much to take   levETIRAcetam 750 MG tablet Commonly known as: KEPPRA Take 1 tablet (750 mg total) by mouth 2 (two) times daily.   loperamide 2 MG capsule Commonly known as: IMODIUM Take 2-4 mg by mouth as needed for diarrhea or loose stools.   LORazepam 0.5 MG tablet Commonly known as: ATIVAN Take 0.5 mg by mouth every 4 (four) hours as needed for anxiety (shortness of breath).   meclizine 12.5 MG tablet Commonly known as: ANTIVERT Take 12.5 mg by mouth daily as needed for dizziness.   metFORMIN 500 MG tablet Commonly known as: GLUCOPHAGE Take 500 mg by mouth 2 (two) times daily.   metoprolol tartrate 25 MG tablet Commonly known as: LOPRESSOR Take 1 tablet (25 mg total) by mouth 2 (two) times daily.   nitroGLYCERIN 0.4 MG SL tablet Commonly known as: NITROSTAT Place 1 tablet (0.4 mg total) under the tongue every 5 (five) minutes as needed for chest pain.   omeprazole 20 MG capsule Commonly known as: PRILOSEC Take 20 mg by mouth daily.   ondansetron 4 MG tablet Commonly known as: ZOFRAN Take 4 mg by mouth every 8 (eight) hours as needed for nausea or vomiting.   oxyCODONE 5 MG immediate release tablet Commonly known as: Oxy IR/ROXICODONE Take 2.5-5 mg by mouth every 2 (two) hours as needed for severe pain (shortness of breath).   prochlorperazine 10 MG tablet Commonly known as: COMPAZINE Take 10 mg by mouth every 6 (six) hours as needed for nausea or vomiting.   senna 8.6 MG Tabs tablet Commonly known as: SENOKOT Take 1-4 tablets by mouth 2 (two) times daily as needed for mild constipation.        Allergies  Allergen Reactions  . Brilinta [Ticagrelor] Other (See Comments)    Arthralgias & myalgias  . Crestor [Rosuvastatin] Other (See Comments)    Myalgias    Consultations:     Procedures/Studies: Dg Chest 2 View  Result Date: 09/13/2019 CLINICAL DATA:  Altered mental status. EXAM: CHEST - 2 VIEW COMPARISON:  06/25/2019 FINDINGS: Heart size and pulmonary vascularity are normal. No infiltrates or effusions. Tiny area of atelectasis at the left base laterally. CABG. Clip on the left atrial appendage. Aortic atherosclerosis. No significant bone abnormality. IMPRESSION: 1. No significant acute abnormalities. 2. Minimal atelectasis at the left base. 3. Aortic atherosclerosis. Electronically Signed   By: Lorriane Shire M.D.   On: 09/13/2019 20:45   Ct Head Wo Contrast  Result Date: 09/13/2019 CLINICAL DATA:  Altered mental status EXAM: CT HEAD WITHOUT  CONTRAST TECHNIQUE: Contiguous axial images were obtained from the base of the skull through the vertex without intravenous contrast. COMPARISON:  06/04/2019, MRI from 06/05/2019 FINDINGS: Brain: There again noted multiple intracranial metastases particularly within the left frontal lobe, left cerebellar hemisphere and right parietal lobe near the vertex these are slightly less prominent than that seen on the prior exam with decreased overall vasogenic edema. Some of the previously seen metastatic lesions on MRI are not well appreciated on today's exam. This is consistent with the interval radiation therapy. Left sphenoid wing meningioma is noted and stable in appearance. No new lesions are identified. No findings to suggest acute hemorrhage or acute infarction are seen. Chronic atrophic and ischemic changes are again noted. Vascular: No hyperdense vessel or unexpected calcification. Skull: Normal. Negative for fracture or focal lesion. Sinuses/Orbits: Mucosal thickening is noted within the maxillary antra bilaterally. Soft tissue  irregularity in the nasal passages is noted which was not appreciated on the prior exam due to the imaging technique. The septum is less well visualized. These changes are likely related to some septal necrosis from previous radiation therapy. Other: Increased fluid is noted within the mastoid air cells on the left. IMPRESSION: Persistent but improved appearance of diffuse intracranial metastatic disease. No acute hemorrhage is seen. Stable left sphenoid wing meningioma. Chronic atrophic and ischemic changes stable from the prior exam. Irregularity in the nasal passages is noted. The septum appears less prominent with surrounding soft tissue density seen. This is new from previous exams and likely related to prior radiation therapy. Direct visualization may be helpful for further evaluation. Electronically Signed   By: Inez Catalina M.D.   On: 09/13/2019 20:11       Subjective: Patient has no complaints this AM.  He is laying in bed without any issues.  Per nursing still with urinary retention bladder scan over 500.  Patient denies any shortness of breath, abdominal pain, urinary symptoms.  Discharge Exam: Vitals:   09/14/19 1642 09/14/19 2058  BP: (!) 105/54 (!) 93/34  Pulse: 79 75  Resp:    Temp:  97.7 F (36.5 C)  SpO2:  100%   Vitals:   09/14/19 0553 09/14/19 0823 09/14/19 1642 09/14/19 2058  BP:   (!) 105/54 (!) 93/34  Pulse: 62 67 79 75  Resp:      Temp:    97.7 F (36.5 C)  TempSrc:    Oral  SpO2: 98% 99%  100%  Weight:      Height:        General: Pt is alert, awake, not in acute distress Cardiovascular: RRR, S1/S2 +, no rubs, no gallops Respiratory: CTA bilaterally, no wheezing, no rhonchi Abdominal: Soft, NT, ND, bowel sounds + Extremities: no edema, no cyanosis    The results of significant diagnostics from this hospitalization (including imaging, microbiology, ancillary and laboratory) are listed below for reference.     Microbiology: Recent Results (from the  past 240 hour(s))  SARS CORONAVIRUS 2 (TAT 6-24 HRS) Nasopharyngeal Nasopharyngeal Swab     Status: None   Collection Time: 09/13/19 10:44 PM   Specimen: Nasopharyngeal Swab  Result Value Ref Range Status   SARS Coronavirus 2 NEGATIVE NEGATIVE Final    Comment: (NOTE) SARS-CoV-2 target nucleic acids are NOT DETECTED. The SARS-CoV-2 RNA is generally detectable in upper and lower respiratory specimens during the acute phase of infection. Negative results do not preclude SARS-CoV-2 infection, do not rule out co-infections with other pathogens, and should not be used as the  sole basis for treatment or other patient management decisions. Negative results must be combined with clinical observations, patient history, and epidemiological information. The expected result is Negative. Fact Sheet for Patients: SugarRoll.be Fact Sheet for Healthcare Providers: https://www.woods-mathews.com/ This test is not yet approved or cleared by the [place] FDA and  has been authorized for detection and/or diagnosis of SARS-CoV-2 by FDA under an Emergency Use Authorization (EUA). This EUA will remain  in effect (meaning this test can be used) for the duration of the COVID-19 declaration under Section 56 4(b)(1) of the Act, 21 U.S.C. section 360bbb-3(b)(1), unless the authorization is terminated or revoked sooner. Performed at [facility], [place] [address], [place], [place] [address]   Urine culture     Status: None   Collection Time: 09/13/19 11:25 PM   Specimen: Urine, Random  Result Value Ref Range Status   Specimen Description URINE, RANDOM  Final   Special Requests NONE  Final   Culture   Final    NO GROWTH Performed at [facility], [place] [address], [place], [place] [address]    Report Status 09/15/2019 FINAL  Final     Labs: BNP (last 3 results) Recent Labs    06/25/19 2259  BNP 509.3*   Basic Metabolic Panel: Recent Labs  Lab  09/13/19 1927 09/13/19 2112 09/14/19 0032 09/14/19 0428 09/14/19 1050 09/15/19 0821  NA 124* 125* 128* 132* 131* 133*  K 3.5 3.4* 3.8 3.2* 3.2* 3.2*  CL 87* 88* 93* 96* 95* 100  CO2 17*  --  17* 24 24 22   GLUCOSE 488* 511* 394* 85 114* 64*  BUN 17 17 17 13 12 8   CREATININE 1.94* 1.70* 1.78* 1.60* 1.41* 1.30*  CALCIUM 7.7*  --  8.0* 8.1* 8.0* 8.2*   Liver Function Tests: Recent Labs  Lab 09/13/19 1927  AST 26  ALT 14  ALKPHOS 73  BILITOT 1.3*  PROT 5.6*  ALBUMIN 2.9*   No results for input(s): LIPASE, AMYLASE in the last 168 hours. No results for input(s): AMMONIA in the last 168 hours. CBC: Recent Labs  Lab 09/13/19 1927 09/13/19 2112  WBC 9.0  --   NEUTROABS 6.6  --   HGB 10.9* 11.6*  HCT 31.8* 34.0*  MCV 88.6  --   PLT 237  --    Cardiac Enzymes: No results for input(s): CKTOTAL, CKMB, CKMBINDEX, TROPONINI in the last 168 hours. BNP: Invalid input(s): POCBNP CBG: Recent Labs  Lab 09/14/19 0929 09/14/19 1112 09/14/19 1621 09/14/19 2101 09/15/19 0804  GLUCAP 117* 112* 143* 113* 55*   D-Dimer No results for input(s): DDIMER in the last 72 hours. Hgb A1c No results for input(s): HGBA1C in the last 72 hours. Lipid Profile No results for input(s): CHOL, HDL, LDLCALC, TRIG, CHOLHDL, LDLDIRECT in the last 72 hours. Thyroid function studies No results for input(s): TSH, T4TOTAL, T3FREE, THYROIDAB in the last 72 hours.  Invalid input(s): FREET3 Anemia work up No results for input(s): VITAMINB12, FOLATE, FERRITIN, TIBC, IRON, RETICCTPCT in the last 72 hours. Urinalysis    Component Value Date/Time   COLORURINE YELLOW 09/13/2019 2012   APPEARANCEUR CLEAR 09/13/2019 2012   LABSPEC 1.007 09/13/2019 2012   PHURINE 6.0 09/13/2019 2012   GLUCOSEU >=500 (A) 09/13/2019 2012   HGBUR NEGATIVE 09/13/2019 2012   [place] 09/13/2019 2012   [place] 09/13/2019 2012   PROTEINUR NEGATIVE 09/13/2019 2012   NITRITE NEGATIVE 09/13/2019 2012    LEUKOCYTESUR NEGATIVE 09/13/2019 2012  Sepsis Labs Invalid input(s): PROCALCITONIN,  WBC,  LACTICIDVEN Microbiology Recent Results (from the past 240 hour(s))  SARS CORONAVIRUS 2 (TAT 6-24 HRS) Nasopharyngeal Nasopharyngeal Swab     Status: None   Collection Time: 09/13/19 10:44 PM   Specimen: Nasopharyngeal Swab  Result Value Ref Range Status   SARS Coronavirus 2 NEGATIVE NEGATIVE Final    Comment: (NOTE) SARS-CoV-2 target nucleic acids are NOT DETECTED. The SARS-CoV-2 RNA is generally detectable in upper and lower respiratory specimens during the acute phase of infection. Negative results do not preclude SARS-CoV-2 infection, do not rule out co-infections with other pathogens, and should not be used as the sole basis for treatment or other patient management decisions. Negative results must be combined with clinical observations, patient history, and epidemiological information. The expected result is Negative. Fact Sheet for Patients: SugarRoll.be Fact Sheet for Healthcare Providers: https://www.woods-mathews.com/ This test is not yet approved or cleared by the [place] FDA and  has been authorized for detection and/or diagnosis of SARS-CoV-2 by FDA under an Emergency Use Authorization (EUA). This EUA will remain  in effect (meaning this test can be used) for the duration of the COVID-19 declaration under Section 56 4(b)(1) of the Act, 21 U.S.C. section 360bbb-3(b)(1), unless the authorization is terminated or revoked sooner. Performed at [facility], [place] [address], [place], [place] [address]   Urine culture     Status: None   Collection Time: 09/13/19 11:25 PM   Specimen: Urine, Random  Result Value Ref Range Status   Specimen Description URINE, RANDOM  Final   Special Requests NONE  Final   Culture   Final    NO GROWTH Performed at [facility], [place] [address] [place], [place] [address]    Report Status  09/15/2019 FINAL  Final     Time coordinating discharge: Over 30 minutes  SIGNED:   Nolberto Hanlon, MD  [facility] 09/15/2019, 10:54 AM Pager   If 7PM-7AM, please contact night-coverage [email or web address] Password [email or web address]

## 2019-09-15 NOTE — Consult Note (Signed)
   [facility] CM Inpatient Consult   09/15/2019  HARTLEY URTON [birth year] [id number]   Chart reviewed for disposition patient had recently been active with [place] Coordinator.  Patient transitioned home with [facility] as reviewed in the inpatient [facility] RNCM notes.  Patient's care management needs will be fully met with Hospice care.  Natividad Brood, RN BSN [facility] Liaison  [phone] business mobile phone Toll free office [phone]  Fax number: [phone] [email or web address]

## 2019-09-15 NOTE — Telephone Encounter (Signed)
-----   Message from Burnell Blanks, MD sent at 09/15/2019  3:41 PM EST ----- Arbie Cookey, I think we had intended for him to be off of Plavix based on what I read in the last note. We will call him to make sure he stops the Plavix. Thanks, Gerald Stabs ----- Message ----- From: Kris Mouton, [facility] Sent: 09/14/2019  11:19 AM EST To: Burnell Blanks, MD  Dr. Julianne Handler,    Gregory Bates was admitted is on asa/plavix/apixaban.  The last clinic note from 07/11/19 mentions that plavix was stopped.  I spoke to him and he does report that he has been taking all three.  Should he still be on plavix?   If therapy needs to be changed and he is discharged before you see this message, I will be happy to follow up with a phone call to give him an update.   Thank you, Mitzi Hansen

## 2019-09-15 NOTE — Progress Notes (Signed)
Inpatient Diabetes Program Recommendations  AACE/ADA: New Consensus Statement on Inpatient Glycemic Control (2015)  Target Ranges:  Prepandial:   less than 140 mg/dL      Peak postprandial:   less than 180 mg/dL (1-2 hours)      Critically ill patients:  140 - 180 mg/dL   Lab Results  Component Value Date   GLUCAP 55 (L) 09/15/2019   HGBA1C 10.8 (H) 06/05/2019    Review of Glycemic Control Results for Gregory Bates, Gregory Bates (MRN [id number]) as of 09/15/2019 10:24  Ref. Range 09/14/2019 16:21 09/14/2019 21:01 09/15/2019 08:04  Glucose-Capillary Latest Ref Range: 70 - 99 mg/dL 143 (H) 113 (H) 55 (L)   Diabetes history: Type 2 DM Outpatient Diabetes medications: Lantus 25 units QHS, Novolog 10 units TID, Metformin 500 mg BID Current orders for Inpatient glycemic control: Lantus 18 units QHS, Novolog 0-9 units TID, Novolog 0-5 units QHS  Inpatient Diabetes Program Recommendations:    Noted hypoglycemia this AM of 55 mg/dL. Patient received Lantus 25 units QHS. Subsequent order changes made this AM to include decrease of Lantus to 18 units QHS. In agreement.   Thanks, Bronson Curb, MSN, RNC-OB Diabetes Coordinator [phone] (8a-5p)

## 2019-09-15 NOTE — Plan of Care (Signed)
Pt discharged to home with hospice care, discharge education completed with pt and wife present, left facility via private vehicle.

## 2019-09-15 NOTE — TOC Transition Note (Signed)
Transition of Care [facility]) - CM/SW Discharge Note   Patient Details  Name: Gregory Bates MRN: [id number] Date of Birth: 1941/05/28  [facility] CM/SW Contact:  Bethena Roys, RN Phone Number: 09/15/2019, 12:46 PM   Clinical Narrative:  Pt will transition home today via private vehicle. Wife at the bedside- awaiting for a coude catheter to be placed before transitioning home. CM did reach out to [employer] and she is following to see when patient leaves to have a nurse visit the patient once he gets home. No further needs from CM at this time.     Final next level of care: Home w Hospice Care Barriers to Discharge: Continued Medical Work up   Patient Goals and CMS Choice Patient states their goals for this hospitalization and ongoing recovery are:: "to return home"   Choice offered to / list presented to : NA   Discharge Plan and Services In-house Referral: NA Discharge Planning Services: CM Consult Post Acute Care Choice: Resumption of Svcs/PTA Provider, Hospice             HH Arranged: RN [facility] Agency: [place] Date [place]: 09/14/19 Time HH Agency Contacted: 1000 Representative spoke with at [place]: [place].  Social Determinants of Health (SDOH) Interventions     Readmission Risk Interventions Readmission Risk Prevention Plan 09/14/2019  Medication Review ([place]) Complete  PCP or Specialist appointment within 3-5 days of discharge Complete  HRI or [place] Complete  SW Recovery Care/Counseling Consult Complete  [place] Not Applicable  Some recent data might be hidden

## 2019-09-15 NOTE — Telephone Encounter (Signed)
Per DPR form, spoke with patient's wife and confirmed with her the patient needs to STOP PLAVIX. She was grateful for call and agrees with treatment plan.

## 2019-10-03 DEATH — deceased

## 2019-10-05 ENCOUNTER — Ambulatory Visit: Payer: Medicare HMO | Admitting: Radiation Oncology

## 2019-11-05 NOTE — Progress Notes (Deleted)
No chief complaint on file.    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 His cardiac history dates back to 2013 at which time he was admitted with an inferior STEMI and the occluded RCA was treated with a drug eluting stent. He was readmitted in 2014 with a NSTEMI and found to have a severe stenosis in the OM branch treated with a DES. Readmitted in 2015 with unstable angina and a drug eluting stent was placed in the RCA. He underwent 3V CABG in September 2019. He had onset of atrial flutter in 2014. He had several admissions in 2015 with atrial fib with RVR. He developed a rash on Cardizem. He has been on amiodarone, metoprolol and Eliquis. Echo September 2019 with LVEF=55-60%, mild AI and mild MR. He is on palliative chemotherapy for metastatic small cell lung cancer. He has had seizures. He has chronic hyponatremia.   He is here today for follow up. The patient denies any chest pain, dyspnea, palpitations, lower extremity edema, orthopnea, PND, dizziness, near syncope or syncope.   Primary Care Physician: Street, Sharon Mt, MD  Past Medical History:  Diagnosis Date  . Acute renal insufficiency    a. During 11/2013 adm with atrial flutter.  . Anemia, unspecified   . Brain metastases ([place])   . CAD (coronary artery disease), native coronary artery    a. s/p prior MIs and multiple stents then CABG with LA clipping in 07/2018.  [name] Kitchen Chronic combined systolic and diastolic CHF (congestive heart failure) ([place]) 12/23/2015   Echo 2/17: Mild LVH, EF 45-50%, inferior akinesis, grade 2 diastolic dysfunction, mild AI, mild MR, mild LAE, PASP 44 mmHg   . CKD (chronic kidney disease), stage III ([place])   . Colorectal polyps 2002  . Diabetes mellitus    AODM  . Erectile  dysfunction   . History of echocardiogram    a. 01/2013 Echo: EF 55%, mid-dist inflat HK, Gr1 DD, Triv AI/TR, Mild MR.;  b.  Echo (1/15): Mild LVH, EF 55%, inferolateral hypokinesis, grade 1 diastolic dysfunction, mild AI, trivial MR, moderate LAE, PASP 36 mmHg   . Hx of cardiovascular stress test    Stress myoview 08/07/13 with LVEF 44%, inferior scar, no ischemia  . Hyperlipidemia   . Hypertension   . Hyponatremia   . Ischemic cardiomyopathy    a. EF 35-40% by LHC in 1/16; b. Echo 2/17: Mild LVH, EF 45-50%, inferior akinesis, grade 2 diastolic dysfunction, mild AI, mild MR, mild LAE, PASP 44 mmHg  . Mild aortic insufficiency   . Mild mitral regurgitation   . Mild tricuspid regurgitation   . Myocardial infarction ([place])   . Oxygen deficiency   . PAF (paroxysmal atrial fibrillation) ([place])   . Paroxysmal atrial flutter ([place])   . Small cell lung cancer in adult [facility]) 07/28/2018  . Syncope     Past Surgical History:  Procedure Laterality Date  . BIOPSY  08/01/2018   Procedure: BIOPSY OF PERICARDIAL LYMPH NODE AND HILAR LYMPH NODE;  Surgeon: Grace Isaac, MD;  Location: [place];  Service: Open Heart Surgery;;  . CLIPPING OF ATRIAL APPENDAGE Left 08/01/2018   Procedure: CLIPPING OF ATRIAL APPENDAGE;  Surgeon: Grace Isaac, MD;  Location: [place];  Service: Open Heart Surgery;  Laterality: Left;  . COLONOSCOPY  W/ POLYPECTOMY  2002   [facility]  . COLONOSCOPY W/ POLYPECTOMY  2004; 2007 negative  . CORONARY ANGIOPLASTY  12/26/2013  . CORONARY ARTERY BYPASS GRAFT N/A 08/01/2018   Procedure: CORONARY ARTERY BYPASS GRAFTING (CABG) TIMES THREE USING LEFT INTERNAL MAMMARY ARTERY TO LAD, RIGHT GREATER SAPHENOUS VEIN GRAFT TO OM AND RCA, VEIN HARVESTED ENDOSCOPICALLY;  Surgeon: Grace Isaac, MD;  Location: [place];  Service: Open Heart Surgery;  Laterality: N/A;  . CORONARY STENT PLACEMENT  2007, 2011  . infantile paralysis facial asymmetry    . LEFT HEART CATH AND CORONARY ANGIOGRAPHY N/A  05/18/2017   Procedure: Left Heart Cath and Coronary Angiography;  Surgeon: Martinique, Peter M, MD;  Location: [place] [facility];  Service: Cardiovascular;  Laterality: N/A;  . LEFT HEART CATH AND CORONARY ANGIOGRAPHY N/A 07/27/2018   Procedure: LEFT HEART CATH AND CORONARY ANGIOGRAPHY;  Surgeon: Martinique, Peter M, MD;  Location: [place] [facility];  Service: Cardiovascular;  Laterality: N/A;  . LEFT HEART CATHETERIZATION WITH CORONARY ANGIOGRAM N/A 10/02/2012   Procedure: LEFT HEART CATHETERIZATION WITH CORONARY ANGIOGRAM;  Surgeon: Peter M Martinique, MD;  Location: [facility] CATH LAB;  Service: Cardiovascular;  Laterality: N/A;  . LEFT HEART CATHETERIZATION WITH CORONARY ANGIOGRAM N/A 02/20/2013   Procedure: LEFT HEART CATHETERIZATION WITH CORONARY ANGIOGRAM;  Surgeon: Burnell Blanks, MD;  Location: [facility] CATH LAB;  Service: Cardiovascular;  Laterality: N/A;  . LEFT HEART CATHETERIZATION WITH CORONARY ANGIOGRAM N/A 12/26/2013   Procedure: LEFT HEART CATHETERIZATION WITH CORONARY ANGIOGRAM;  Surgeon: Burnell Blanks, MD;  Location: [facility] CATH LAB;  Service: Cardiovascular;  Laterality: N/A;  . LEFT HEART CATHETERIZATION WITH CORONARY ANGIOGRAM N/A 11/28/2014   Procedure: LEFT HEART CATHETERIZATION WITH CORONARY ANGIOGRAM;  Surgeon: Burnell Blanks, MD;  Location: [facility] CATH LAB;  Service: Cardiovascular;  Laterality: N/A;  . PERCUTANEOUS CORONARY STENT INTERVENTION (PCI-S)  10/02/2012   Procedure: PERCUTANEOUS CORONARY STENT INTERVENTION (PCI-S);  Surgeon: Peter M Martinique, MD;  Location: [facility] CATH LAB;  Service: Cardiovascular;;  . PERCUTANEOUS CORONARY STENT INTERVENTION (PCI-S)  02/20/2013   Procedure: PERCUTANEOUS CORONARY STENT INTERVENTION (PCI-S);  Surgeon: Burnell Blanks, MD;  Location: [facility] CATH LAB;  Service: Cardiovascular;;  . PERCUTANEOUS CORONARY STENT INTERVENTION (PCI-S)  12/26/2013   Procedure: PERCUTANEOUS CORONARY STENT INTERVENTION (PCI-S);  Surgeon: Burnell Blanks, MD;   Location: [facility] CATH LAB;  Service: Cardiovascular;;  . TEE WITHOUT CARDIOVERSION N/A 08/01/2018   Procedure: TRANSESOPHAGEAL ECHOCARDIOGRAM (TEE);  Surgeon: Grace Isaac, MD;  Location: [place];  Service: Open Heart Surgery;  Laterality: N/A;  . WISDOM TOOTH EXTRACTION      Current Outpatient Medications  Medication Sig Dispense Refill  . acetaminophen (TYLENOL) 325 MG tablet Take 2 tablets (650 mg total) by mouth every 6 (six) hours as needed for mild pain.    [name] Kitchen amiodarone (PACERONE) 200 MG tablet TAKE 1 TABLET (200 MG TOTAL) BY MOUTH DAILY. (Patient not taking: Reported on 09/14/2019) 90 tablet 0  . apixaban (ELIQUIS) 5 MG TABS tablet Take 1 tablet (5 mg total) by mouth 2 (two) times daily. 180 tablet 2  . aspirin EC 81 MG EC tablet Take 1 tablet (81 mg total) by mouth daily. (Patient not taking: Reported on 09/14/2019)    . atorvastatin (LIPITOR) 20 MG tablet TAKE 1 TABLET EVERY DAY (Patient taking differently: Take 20 mg by mouth daily. ) 90 tablet 3  . dexamethasone (DECADRON) 4 MG tablet Take 1 tablet (4 mg total) by mouth 3 (three) times daily. (Patient  not taking: Reported on 09/14/2019) 30 tablet 0  . furosemide (LASIX) 20 MG tablet TAKE 1 TABLET (20 MG TOTAL) BY MOUTH DAILY. (Patient taking differently: Take 20 mg by mouth daily. ) 90 tablet 1  . insulin aspart (NOVOLOG) 100 UNIT/ML injection Inject 10 Units into the skin 3 (three) times daily with meals. 10 mL 1  . insulin glargine (LANTUS) 100 UNIT/ML injection Inject 0.18 mLs (18 Units total) into the skin at bedtime. 10 mL 1  . levETIRAcetam (KEPPRA) 750 MG tablet Take 1 tablet (750 mg total) by mouth 2 (two) times daily. 60 tablet 0  . loperamide (IMODIUM) 2 MG capsule Take 2-4 mg by mouth as needed for diarrhea or loose stools.    [name] Kitchen LORazepam (ATIVAN) 0.5 MG tablet Take 0.5 mg by mouth every 4 (four) hours as needed for anxiety (shortness of breath).    . meclizine (ANTIVERT) 12.5 MG tablet Take 12.5 mg by mouth daily as needed  for dizziness.    . metFORMIN (GLUCOPHAGE) 500 MG tablet Take 500 mg by mouth 2 (two) times daily.    . metoprolol tartrate (LOPRESSOR) 25 MG tablet Take 1 tablet (25 mg total) by mouth 2 (two) times daily. 180 tablet 2  . nitroGLYCERIN (NITROSTAT) 0.4 MG SL tablet Place 1 tablet (0.4 mg total) under the tongue every 5 (five) minutes as needed for chest pain. 25 tablet 2  . omeprazole (PRILOSEC) 20 MG capsule Take 20 mg by mouth daily.    . ondansetron (ZOFRAN) 4 MG tablet Take 4 mg by mouth every 8 (eight) hours as needed for nausea or vomiting.    [name] Kitchen oxyCODONE (OXY IR/ROXICODONE) 5 MG immediate release tablet Take 2.5-5 mg by mouth every 2 (two) hours as needed for severe pain (shortness of breath).    . prochlorperazine (COMPAZINE) 10 MG tablet Take 10 mg by mouth every 6 (six) hours as needed for nausea or vomiting.    . senna (SENOKOT) 8.6 MG TABS tablet Take 1-4 tablets by mouth 2 (two) times daily as needed for mild constipation.     No current facility-administered medications for this visit.    Allergies  Allergen Reactions  . Brilinta [Ticagrelor] Other (See Comments)    Arthralgias & myalgias  . Crestor [Rosuvastatin] Other (See Comments)    Myalgias    Social History   Socioeconomic History  . Marital status: Married    Spouse name: Not on file  . Number of children: 1  . Years of education: Not on file  . Highest education level: Not on file  Occupational History  . Occupation: maintenance    Comment: full time  Tobacco Use  . Smoking status: Former Smoker    Packs/day: 2.00    Years: 30.00    Pack years: 60.00    Types: Cigarettes    Quit date: 11/02/1986    Years since quitting: 33.0  . Smokeless tobacco: Never Used  . Tobacco comment: smoked [place], up to 3 ppd (!)  Substance and Sexual Activity  . Alcohol use: No  . Drug use: No  . Sexual activity: Not on file  Other Topics Concern  . Not on file  Social History Narrative   [employer] - does  maintenance   Social Determinants of Health   Financial Resource Strain:   . Difficulty of Paying Living Expenses: Not on file  Food Insecurity:   . Worried About Charity fundraiser in the Last Year: Not on file  .  Ran Out of Food in the Last Year: Not on file  Transportation Needs:   . Lack of Transportation (Medical): Not on file  . Lack of Transportation (Non-Medical): Not on file  Physical Activity:   . Days of Exercise per Week: Not on file  . Minutes of Exercise per Session: Not on file  Stress:   . Feeling of Stress : Not on file  Social Connections:   . Frequency of Communication with Friends and Family: Not on file  . Frequency of Social Gatherings with Friends and Family: Not on file  . Attends Religious Services: Not on file  . Active Member of Clubs or Organizations: Not on file  . Attends Archivist Meetings: Not on file  . Marital Status: Not on file  Intimate Partner Violence:   . Fear of Current or Ex-Partner: Not on file  . Emotionally Abused: Not on file  . Physically Abused: Not on file  . Sexually Abused: Not on file    Family History  Problem Relation Age of Onset  . Heart attack Brother 47  . Colon cancer Brother   . Prostate cancer Brother   . Breast cancer Sister   . Stroke Sister   . Diabetes Neg Hx   . Hypertension Neg Hx     Review of Systems:  As stated in the HPI and otherwise negative.   There were no vitals taken for this visit.  Physical Examination: General: Well developed, well nourished, NAD  HEENT: OP clear, mucus membranes moist  SKIN: warm, dry. No rashes. Neuro: No focal deficits  Musculoskeletal: Muscle strength 5/5 all ext  Psychiatric: Mood and affect normal  Neck: No JVD, no carotid bruits, no thyromegaly, no lymphadenopathy.  Lungs:Clear bilaterally, no wheezes, rhonci, crackles Cardiovascular: Regular rate and rhythm. No murmurs, gallops or rubs. Abdomen:Soft. Bowel sounds present. Non-tender.    Extremities: No lower extremity edema. Pulses are 2 + in the bilateral DP/PT.  Echo September 2019:  - Left ventricle: The cavity size was normal. There was moderate   concentric hypertrophy. Systolic function was normal. The   estimated ejection fraction was in the range of 55% to 60%. Wall   motion was normal; there were no regional wall motion   abnormalities. Features are consistent with a pseudonormal left   ventricular filling pattern, with concomitant abnormal relaxation   and increased filling pressure (grade 2 diastolic dysfunction).   Doppler parameters are consistent with elevated ventricular   end-diastolic filling pressure. - Aortic valve: Trileaflet; moderately thickened, moderately   calcified leaflets. There was mild regurgitation. - Aortic root: The aortic root was normal in size. - Mitral valve: There was mild regurgitation. - Left atrium: The atrium was mildly dilated. - Right ventricle: The cavity size was normal. Wall thickness was   normal. Systolic function was normal. - Tricuspid valve: There was mild regurgitation. - Pulmonic valve: There was no regurgitation. - Pulmonary arteries: Systolic pressure was mildly increased. PA   peak pressure: 31 mm Hg (S). - Inferior vena cava: The vessel was normal in size. - Pericardium, extracardiac: There was no pericardial effusion.  EKG:  EKG is not *** ordered today. The ekg ordered today demonstrates    Recent Labs: 06/08/2019: Magnesium 2.3 06/25/2019: B Natriuretic Peptide 924.0 07/25/2019: TSH 0.215 09/13/2019: ALT 14; Hemoglobin 11.6; Platelets 237 09/15/2019: BUN 8; Creatinine, Ser 1.30; Potassium 3.2; Sodium 133      Wt Readings from Last 3 Encounters:  09/14/19 135 lb 9.3 oz (61.5  kg)  08/04/19 153 lb (69.4 kg)  08/03/19 157 lb (71.2 kg)     Other studies Reviewed: Additional studies/ records that were reviewed today include: . Review of the above records demonstrates:    Assessment and Plan:   1.  CAD s/p CABG with stable angina: No angina. Continue ASA, statin and beta blocker.    2. HTN: BP well controlled. No changes  3. Hyperlipidemia: Continue statin  4. Atrial fibrillation/flutter, paroxysmal: Sinus today on exam. Continue Eliquis, amiodarone and metoprolol.   5. Chronic diastolic CHF: Weight is stable. No evidence of volume overload. Lasix ***  6. Hyponatremia: Felt to be due to SIADH. Managed in primary care.    Current medicines are reviewed at length with the patient today.  The patient does not have concerns regarding medicines.  The following changes have been made:  no change  Labs/ tests ordered today include:   No orders of the defined types were placed in this encounter.   Disposition:   FU with me 6 months  Signed, Lauree Chandler, MD 11/05/2019 11:23 AM    [facility] [address], [place], [place]  [address] Phone: [phone]; Fax: [phone]

## 2019-11-06 ENCOUNTER — Ambulatory Visit: Payer: Self-pay | Admitting: Cardiovascular Disease

## 2019-11-21 IMAGING — DX DG CHEST 2V
2 series · 2 of 2 positions shown · non-contrast
Comparison: 07/19/2018

CLINICAL DATA: Pt reports cough x yesterday and left sided chest
pain for over 1 hour; Hx of MI w/ 5 stents, DM, and HTN; former
smoker

EXAM:
CHEST - 2 VIEW

[chest pa]
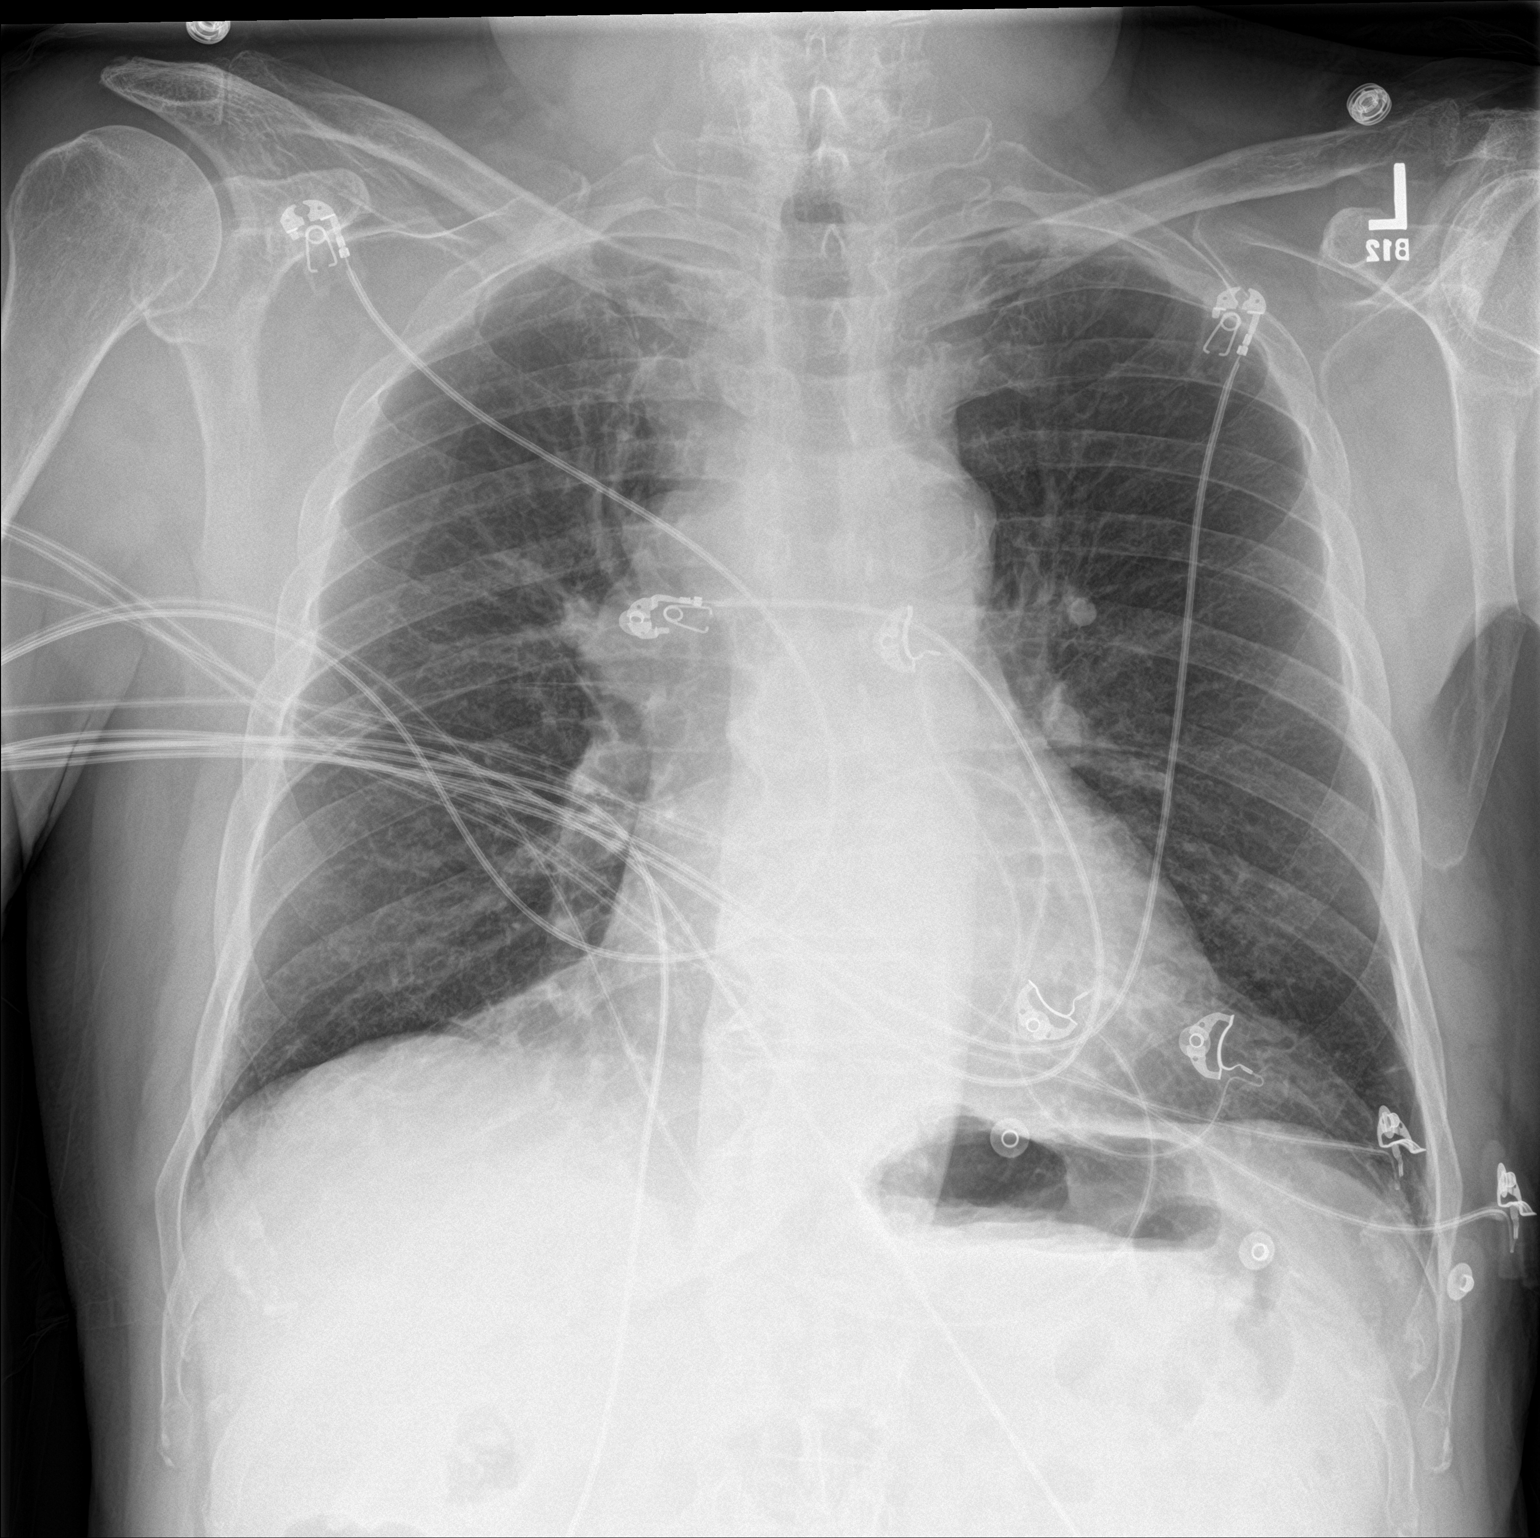

[chest lat]
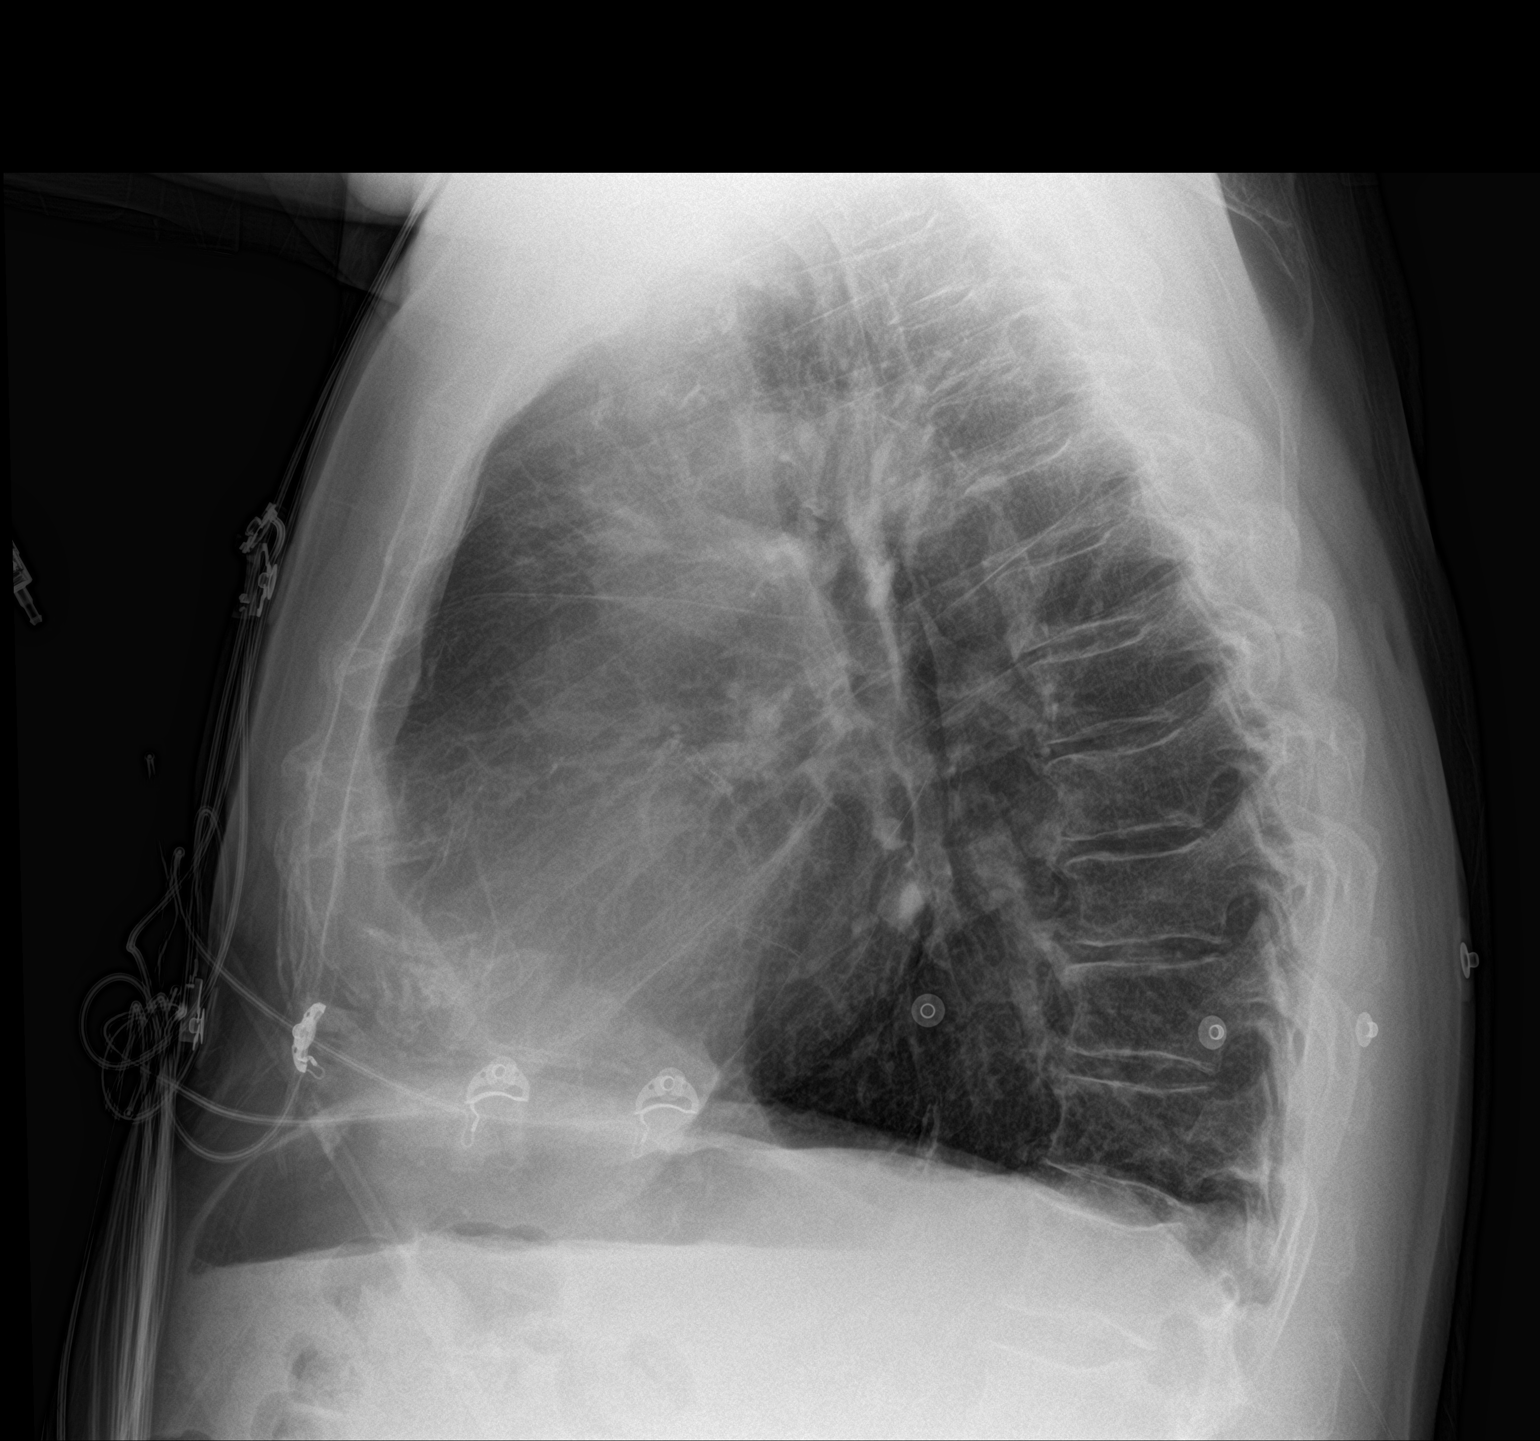

[2 of 2 positions shown; findings below may reference images not displayed]

FINDINGS: Cardiac silhouette is normal in size and configuration no
mediastinal or hilar masses. No evidence of adenopathy.

Clear lungs.  No pleural effusion or pneumothorax.

Skeletal structures are demineralized but intact.
IMPRESSION: No active cardiopulmonary disease.

## 2019-11-29 IMAGING — DX DG CHEST 1V PORT
1 series · 2 of 2 positions shown · non-contrast
Comparison: 07/30/2018

CLINICAL DATA: Status post CABG

EXAM:
PORTABLE CHEST 1 VIEW

[Series 1: chest · 0.14mm/px · 2 of 2 slices shown]
[im 1/2]
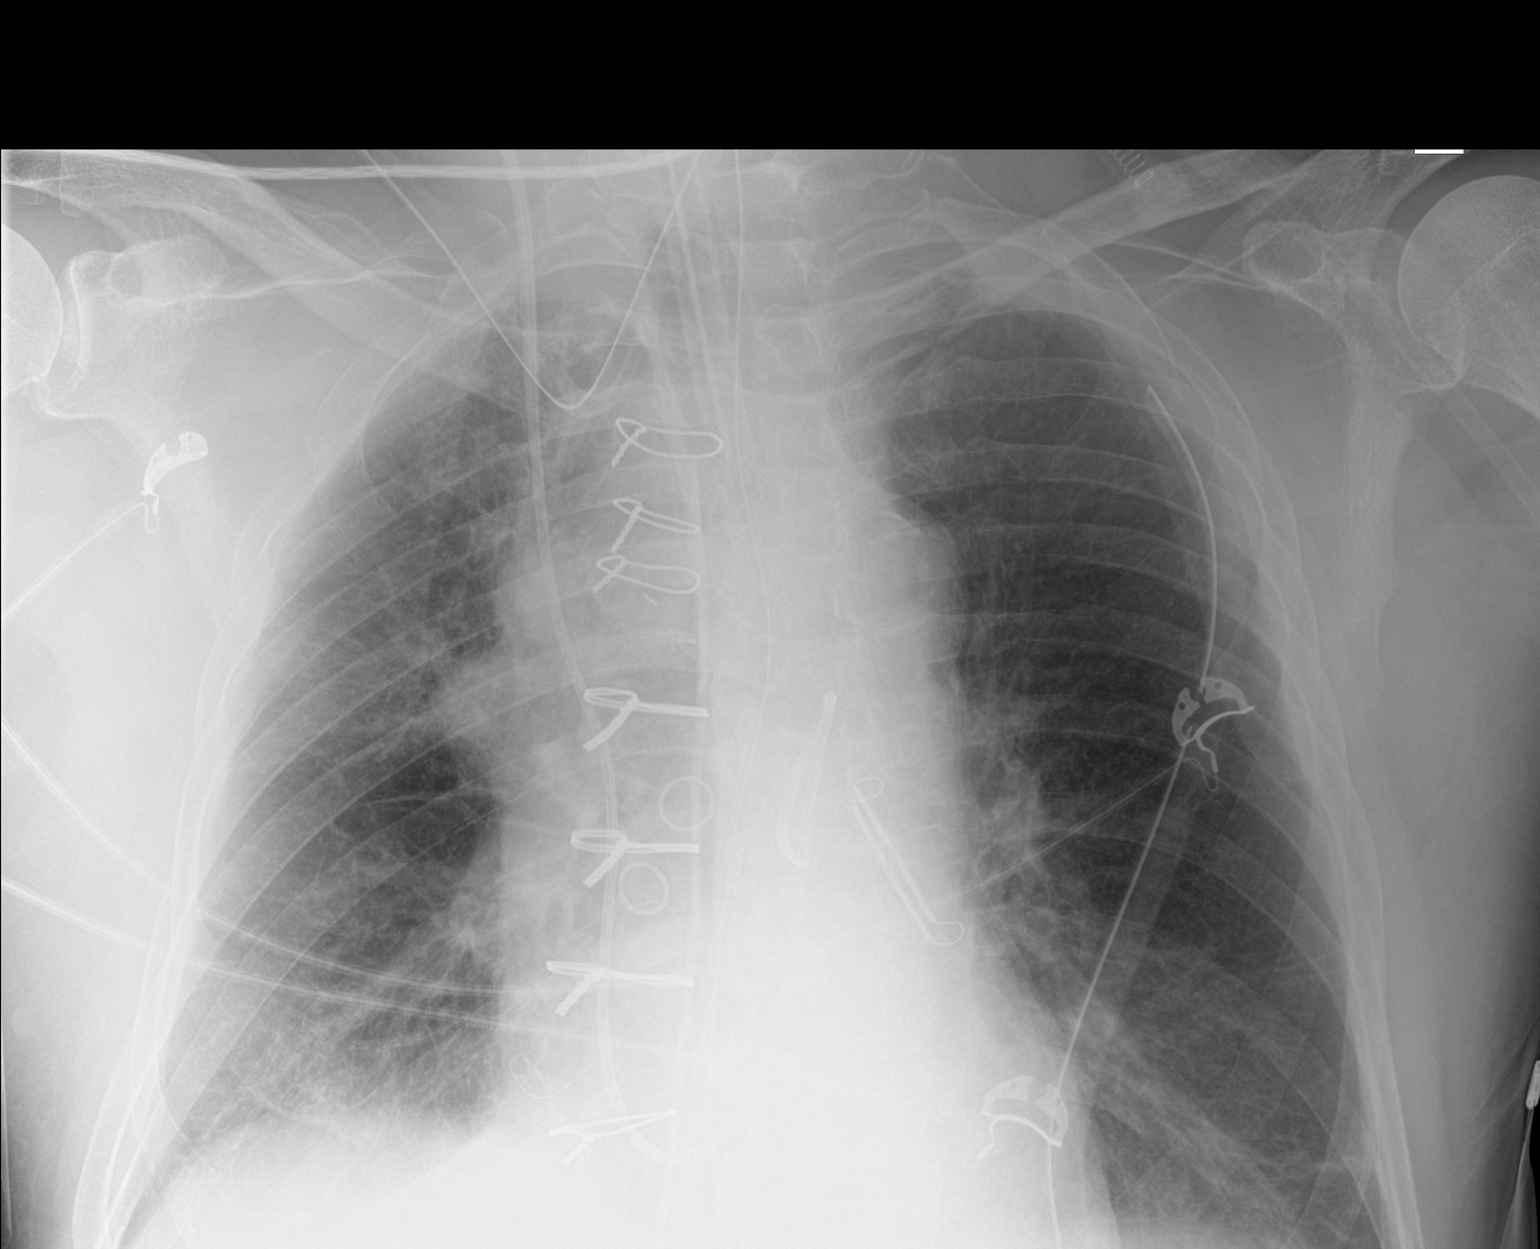
[im 2/2]
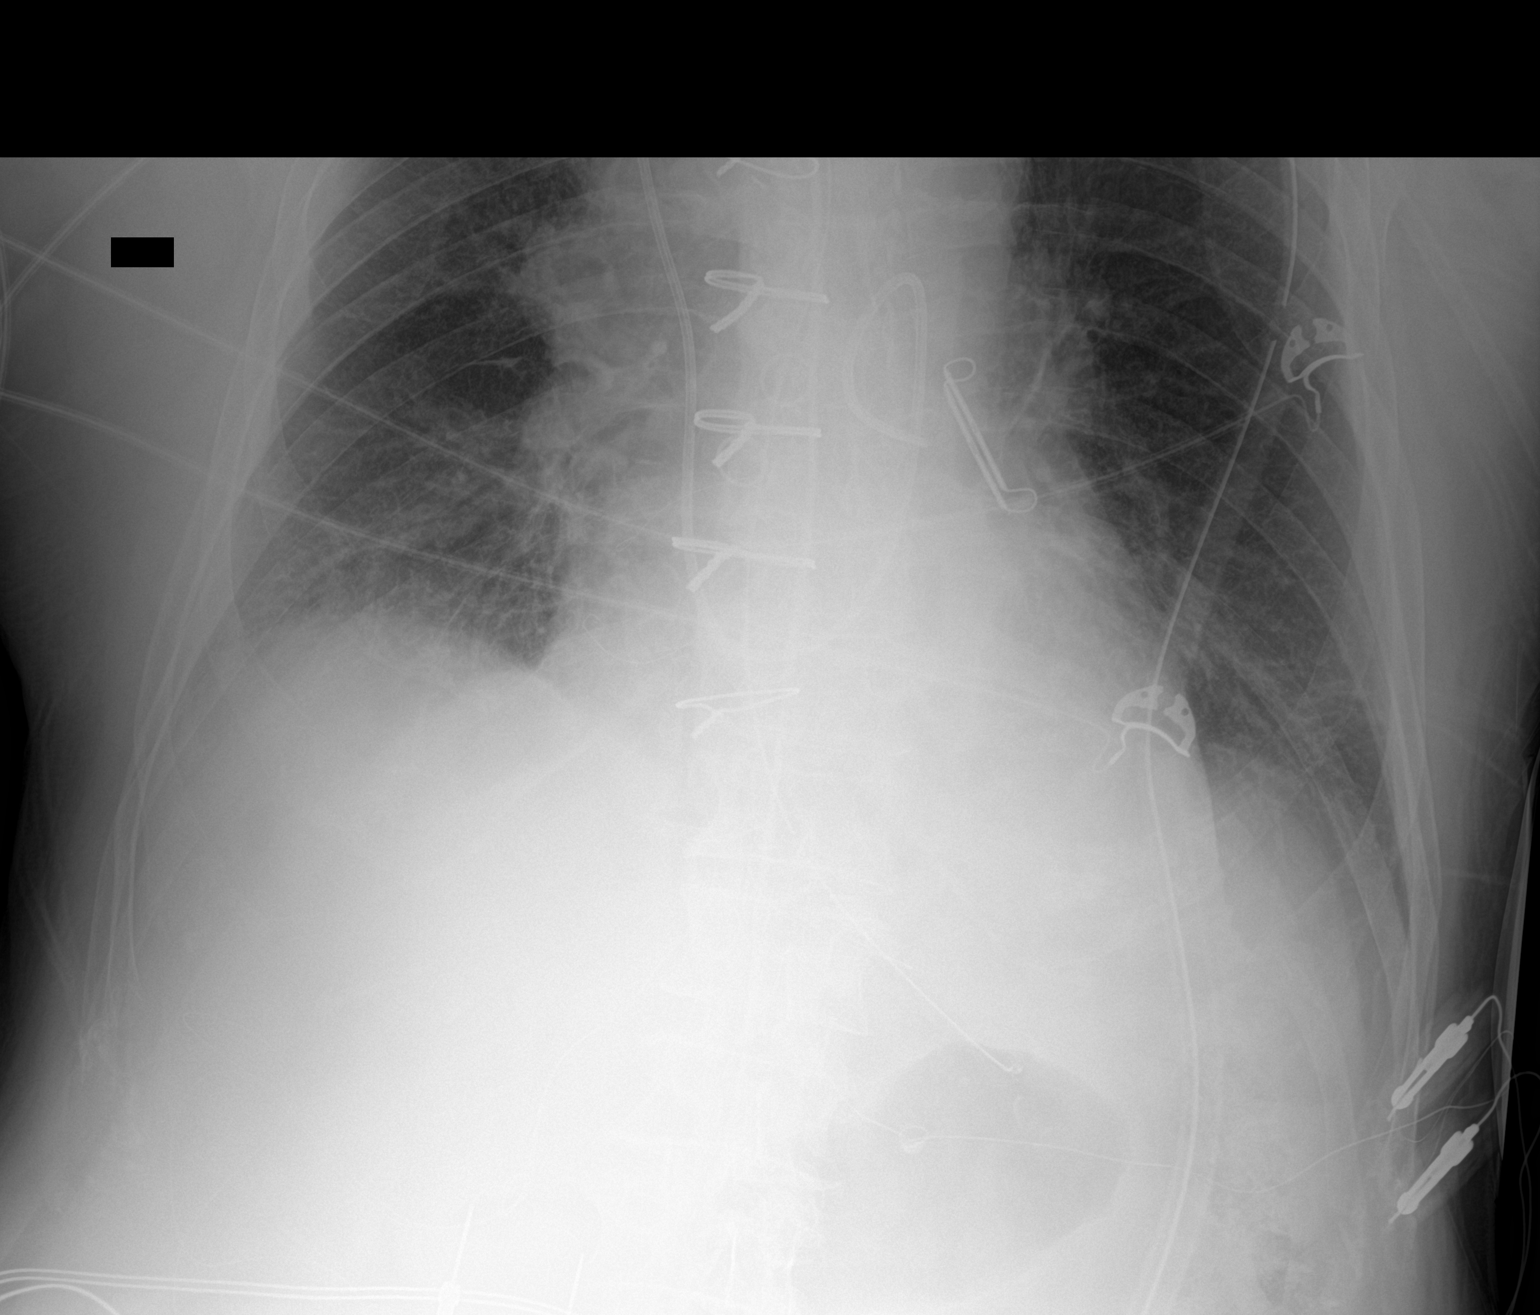

[2 of 2 positions shown; findings below may reference images not displayed]

FINDINGS: ET tube tip is above the carina. Enteric tube tip is below the field
of view. There is an IJ catheter with tip looped in the right
ventricular outflow tract. Mediastinal drain and left chest tubes
identified. No pneumothorax identified. Mild edema identified within
the right base. No airspace densities identified. Right perihilar
and right paratracheal lung mass again noted.
IMPRESSION: 1. Postoperative changes from CABG procedure. No complications
identified.
2. Right perihilar and right paratracheal lung mass as before.

## 2019-11-30 IMAGING — DX DG CHEST 1V PORT
1 series · 1 of 1 positions shown · non-contrast
Comparison: Chest x-ray of 08/01/2017

CLINICAL DATA: Chest tube present, follow-up

EXAM:
PORTABLE CHEST 1 VIEW

[chest]
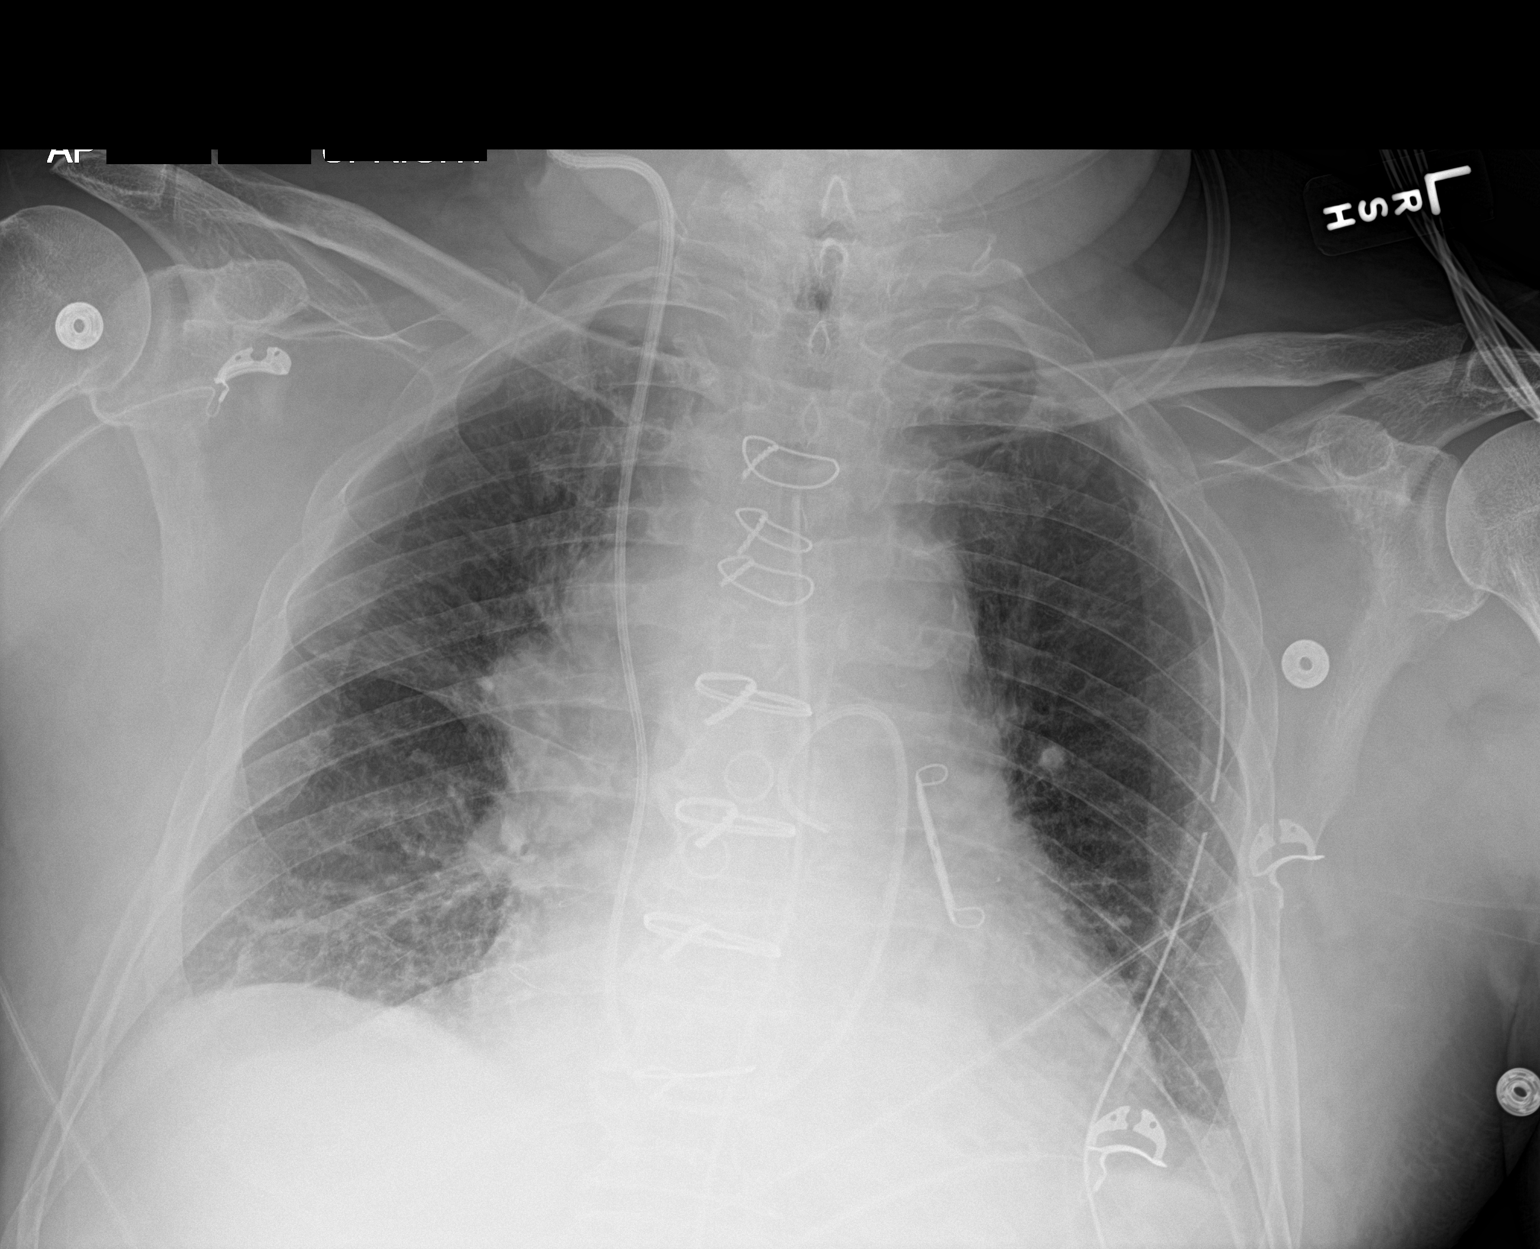

[1 of 1 positions shown; findings below may reference images not displayed]

FINDINGS: The endotracheal tube and NG tube have been removed. A left chest
tube remains and no pneumothorax is noted. There is mild basilar
volume loss. Right Swan-Ganz catheter is coiled in main pulmonary
artery. Cardiomegaly is stable. There may be very mild pulmonary
vascular congestion present and small effusions cannot be excluded.
Median sternotomy sutures are present
IMPRESSION: 1. Endotracheal tube and NG tube removed.
2. Little change in aeration with mild basilar volume loss and
possible small effusions.
3. Left chest tube remains with no pneumothorax.
4. Little change in cardiomegaly and possible minimal pulmonary
vascular congestion.

## 2019-12-01 IMAGING — DX DG CHEST 1V PORT
1 series · 1 of 1 positions shown · non-contrast
Comparison: 08/02/2018

CLINICAL DATA: Follow-up chest tube on the left

EXAM:
PORTABLE CHEST 1 VIEW

[chest]
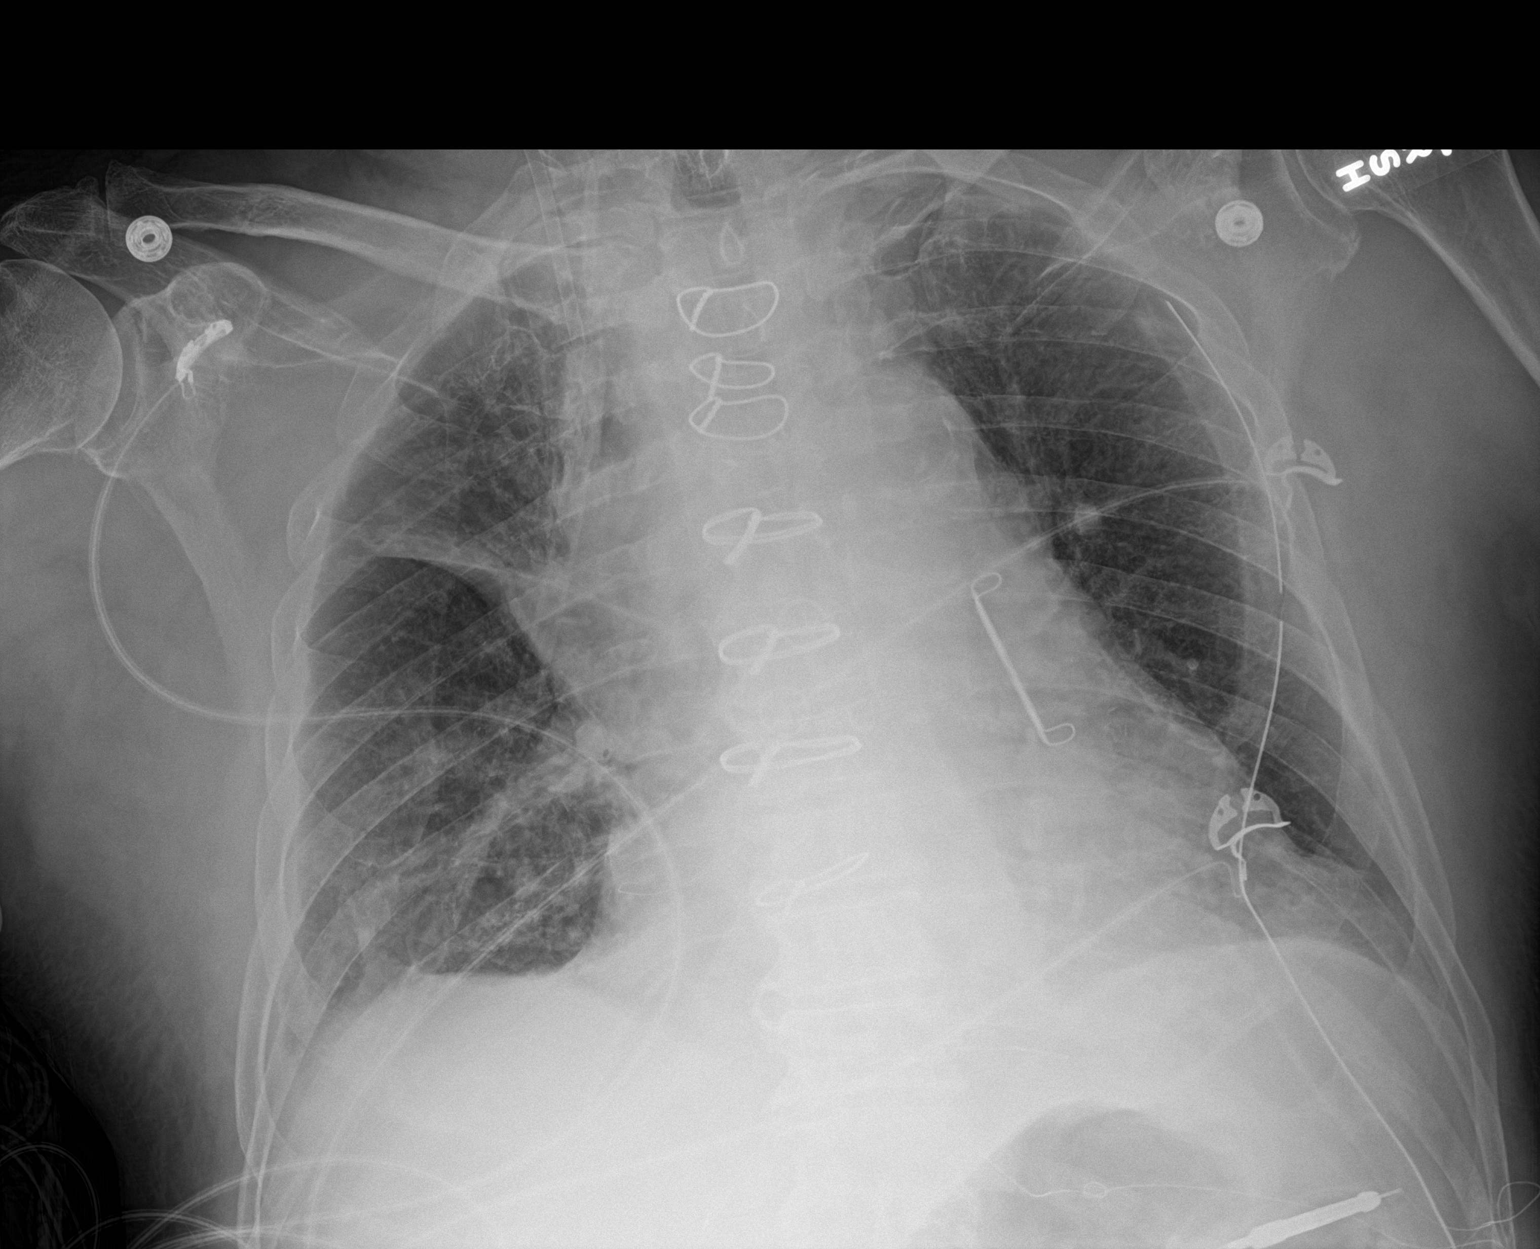

[1 of 1 positions shown; findings below may reference images not displayed]

FINDINGS: Cardiac shadow is enlarged but stable. Postsurgical changes are
again seen. The Swan-Ganz catheter and mediastinal drain have been
removed in the interval. Left thoracostomy catheter remains. No
definitive pneumothorax is seen. Increasing density is noted in the
right upper lobe which may be related to some atelectasis and volume
loss. No bony abnormality is seen
IMPRESSION: Slight increase in the degree of right upper lobe atelectasis.

No pneumothorax is noted on the left.

## 2021-10-02 DEATH — deceased
